# Patient Record
Sex: Male | Born: 1950
Health system: Southern US, Community
[De-identification: ages and names within clinical notes are randomized; demographics above are authoritative.]

## PROBLEM LIST (undated history)

## (undated) DIAGNOSIS — E1165 Type 2 diabetes mellitus with hyperglycemia: Secondary | ICD-10-CM

## (undated) DIAGNOSIS — E785 Hyperlipidemia, unspecified: Secondary | ICD-10-CM

## (undated) DIAGNOSIS — R7401 Elevation of levels of liver transaminase levels: Secondary | ICD-10-CM

## (undated) DIAGNOSIS — I480 Paroxysmal atrial fibrillation: Secondary | ICD-10-CM

## (undated) DIAGNOSIS — R011 Cardiac murmur, unspecified: Secondary | ICD-10-CM

## (undated) DIAGNOSIS — E039 Hypothyroidism, unspecified: Secondary | ICD-10-CM

## (undated) DIAGNOSIS — I25118 Atherosclerotic heart disease of native coronary artery with other forms of angina pectoris: Secondary | ICD-10-CM

## (undated) DIAGNOSIS — K219 Gastro-esophageal reflux disease without esophagitis: Secondary | ICD-10-CM

## (undated) DIAGNOSIS — R945 Abnormal results of liver function studies: Secondary | ICD-10-CM

## (undated) DIAGNOSIS — G8918 Other acute postprocedural pain: Secondary | ICD-10-CM

## (undated) DIAGNOSIS — R600 Localized edema: Secondary | ICD-10-CM

## (undated) DIAGNOSIS — R7309 Other abnormal glucose: Secondary | ICD-10-CM

## (undated) DIAGNOSIS — I2510 Atherosclerotic heart disease of native coronary artery without angina pectoris: Secondary | ICD-10-CM

## (undated) DIAGNOSIS — E861 Hypovolemia: Secondary | ICD-10-CM

## (undated) DIAGNOSIS — N179 Acute kidney failure, unspecified: Secondary | ICD-10-CM

## (undated) DIAGNOSIS — I499 Cardiac arrhythmia, unspecified: Secondary | ICD-10-CM

## (undated) DIAGNOSIS — R609 Edema, unspecified: Secondary | ICD-10-CM

## (undated) DIAGNOSIS — Z87898 Personal history of other specified conditions: Secondary | ICD-10-CM

## (undated) DIAGNOSIS — I483 Typical atrial flutter: Secondary | ICD-10-CM

## (undated) DIAGNOSIS — I9589 Other hypotension: Secondary | ICD-10-CM

## (undated) DIAGNOSIS — I4891 Unspecified atrial fibrillation: Secondary | ICD-10-CM

## (undated) DIAGNOSIS — H3530 Unspecified macular degeneration: Secondary | ICD-10-CM

## (undated) DIAGNOSIS — Z794 Long term (current) use of insulin: Secondary | ICD-10-CM

## (undated) DIAGNOSIS — I952 Hypotension due to drugs: Secondary | ICD-10-CM

## (undated) DIAGNOSIS — D649 Anemia, unspecified: Secondary | ICD-10-CM

## (undated) DIAGNOSIS — C801 Malignant (primary) neoplasm, unspecified: Secondary | ICD-10-CM

## (undated) DIAGNOSIS — E8809 Other disorders of plasma-protein metabolism, not elsewhere classified: Secondary | ICD-10-CM

## (undated) DIAGNOSIS — K5903 Drug induced constipation: Secondary | ICD-10-CM

## (undated) DIAGNOSIS — E088 Diabetes mellitus due to underlying condition with unspecified complications: Secondary | ICD-10-CM

## (undated) DIAGNOSIS — R601 Generalized edema: Secondary | ICD-10-CM

## (undated) DIAGNOSIS — R0600 Dyspnea, unspecified: Secondary | ICD-10-CM

## (undated) DIAGNOSIS — J392 Other diseases of pharynx: Secondary | ICD-10-CM

## (undated) DIAGNOSIS — E46 Unspecified protein-calorie malnutrition: Secondary | ICD-10-CM

## (undated) DIAGNOSIS — I10 Essential (primary) hypertension: Secondary | ICD-10-CM

## (undated) DIAGNOSIS — I4819 Other persistent atrial fibrillation: Secondary | ICD-10-CM

## (undated) DIAGNOSIS — E876 Hypokalemia: Secondary | ICD-10-CM

## (undated) HISTORY — DX: Abnormal results of liver function studies: R94.5

## (undated) HISTORY — PX: KNEE SURGERY: SHX244

## (undated) HISTORY — DX: Edema, unspecified: R60.9

## (undated) HISTORY — PX: CARDIAC CATHETERIZATION: SHX172

## (undated) HISTORY — DX: Hyperlipidemia, unspecified: E78.5

## (undated) HISTORY — DX: Localized edema: R60.0

## (undated) HISTORY — DX: Other acute postprocedural pain: G89.18

## (undated) HISTORY — DX: Diabetes mellitus due to underlying condition with unspecified complications: E08.8

## (undated) HISTORY — DX: Paroxysmal atrial fibrillation: I48.0

## (undated) HISTORY — DX: Atherosclerotic heart disease of native coronary artery without angina pectoris: I25.10

## (undated) HISTORY — DX: Other disorders of plasma-protein metabolism, not elsewhere classified: E88.09

## (undated) HISTORY — DX: Unspecified macular degeneration: H35.30

## (undated) HISTORY — DX: Other diseases of pharynx: J39.2

## (undated) HISTORY — DX: Drug induced constipation: K59.03

## (undated) HISTORY — DX: Hypokalemia: E87.6

## (undated) HISTORY — DX: Unspecified protein-calorie malnutrition: E46

## (undated) HISTORY — DX: Generalized edema: R60.1

## (undated) HISTORY — DX: Hypovolemia: E86.1

## (undated) HISTORY — PX: HAND SURGERY: SHX662

## (undated) HISTORY — PX: CARDIAC SURGERY: SHX584

## (undated) HISTORY — DX: Other hypotension: I95.89

## (undated) HISTORY — DX: Elevation of levels of liver transaminase levels: R74.01

## (undated) HISTORY — DX: Personal history of other specified conditions: Z87.898

## (undated) HISTORY — PX: CORONARY ARTERY BYPASS GRAFT: SHX141

## (undated) HISTORY — DX: Other abnormal glucose: R73.09

## (undated) HISTORY — DX: Type 2 diabetes mellitus with hyperglycemia: E11.65

## (undated) HISTORY — DX: Other persistent atrial fibrillation: I48.19

## (undated) HISTORY — DX: Essential (primary) hypertension: I10

## (undated) HISTORY — PX: AORTIC VALVE REPLACEMENT: SHX41

## (undated) HISTORY — DX: Unspecified atrial fibrillation: I48.91

## (undated) HISTORY — DX: Atherosclerotic heart disease of native coronary artery with other forms of angina pectoris: I25.118

## (undated) HISTORY — DX: Long term (current) use of insulin: Z79.4

## (undated) HISTORY — DX: Acute kidney failure, unspecified: N17.9

## (undated) HISTORY — DX: Gastro-esophageal reflux disease without esophagitis: K21.9

## (undated) HISTORY — DX: Hypotension due to drugs: I95.2

---
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
## 2015-11-22 HISTORY — PX: CORONARY ARTERY BYPASS GRAFT: SHX141

## 2016-04-09 DIAGNOSIS — E1129 Type 2 diabetes mellitus with other diabetic kidney complication: Secondary | ICD-10-CM | POA: Diagnosis not present

## 2016-04-09 DIAGNOSIS — E291 Testicular hypofunction: Secondary | ICD-10-CM | POA: Diagnosis not present

## 2016-04-09 DIAGNOSIS — I2510 Atherosclerotic heart disease of native coronary artery without angina pectoris: Secondary | ICD-10-CM | POA: Diagnosis not present

## 2016-04-09 DIAGNOSIS — I10 Essential (primary) hypertension: Secondary | ICD-10-CM | POA: Diagnosis not present

## 2016-04-09 DIAGNOSIS — E785 Hyperlipidemia, unspecified: Secondary | ICD-10-CM | POA: Diagnosis not present

## 2016-05-30 DIAGNOSIS — E785 Hyperlipidemia, unspecified: Secondary | ICD-10-CM

## 2016-05-30 DIAGNOSIS — I10 Essential (primary) hypertension: Secondary | ICD-10-CM

## 2016-05-30 DIAGNOSIS — E088 Diabetes mellitus due to underlying condition with unspecified complications: Secondary | ICD-10-CM | POA: Insufficient documentation

## 2016-05-30 DIAGNOSIS — I480 Paroxysmal atrial fibrillation: Secondary | ICD-10-CM | POA: Insufficient documentation

## 2016-05-30 DIAGNOSIS — I2510 Atherosclerotic heart disease of native coronary artery without angina pectoris: Secondary | ICD-10-CM

## 2016-05-30 HISTORY — DX: Hyperlipidemia, unspecified: E78.5

## 2016-05-30 HISTORY — DX: Diabetes mellitus due to underlying condition with unspecified complications: E08.8

## 2016-05-30 HISTORY — DX: Atherosclerotic heart disease of native coronary artery without angina pectoris: I25.10

## 2016-05-30 HISTORY — DX: Paroxysmal atrial fibrillation: I48.0

## 2016-05-30 HISTORY — DX: Essential (primary) hypertension: I10

## 2016-06-16 DIAGNOSIS — Z5181 Encounter for therapeutic drug level monitoring: Secondary | ICD-10-CM | POA: Diagnosis not present

## 2016-06-16 DIAGNOSIS — E785 Hyperlipidemia, unspecified: Secondary | ICD-10-CM | POA: Diagnosis not present

## 2016-06-16 DIAGNOSIS — E088 Diabetes mellitus due to underlying condition with unspecified complications: Secondary | ICD-10-CM | POA: Diagnosis not present

## 2016-06-16 DIAGNOSIS — I2510 Atherosclerotic heart disease of native coronary artery without angina pectoris: Secondary | ICD-10-CM | POA: Diagnosis not present

## 2016-06-16 DIAGNOSIS — I10 Essential (primary) hypertension: Secondary | ICD-10-CM | POA: Diagnosis not present

## 2016-06-16 DIAGNOSIS — I480 Paroxysmal atrial fibrillation: Secondary | ICD-10-CM | POA: Diagnosis not present

## 2016-06-28 DIAGNOSIS — I2510 Atherosclerotic heart disease of native coronary artery without angina pectoris: Secondary | ICD-10-CM | POA: Diagnosis not present

## 2016-06-28 DIAGNOSIS — E088 Diabetes mellitus due to underlying condition with unspecified complications: Secondary | ICD-10-CM | POA: Diagnosis not present

## 2016-06-28 DIAGNOSIS — I10 Essential (primary) hypertension: Secondary | ICD-10-CM | POA: Diagnosis not present

## 2016-06-28 DIAGNOSIS — I480 Paroxysmal atrial fibrillation: Secondary | ICD-10-CM | POA: Diagnosis not present

## 2016-07-01 DIAGNOSIS — I10 Essential (primary) hypertension: Secondary | ICD-10-CM | POA: Diagnosis not present

## 2016-07-01 DIAGNOSIS — I350 Nonrheumatic aortic (valve) stenosis: Secondary | ICD-10-CM | POA: Diagnosis not present

## 2016-07-01 DIAGNOSIS — E785 Hyperlipidemia, unspecified: Secondary | ICD-10-CM | POA: Diagnosis not present

## 2016-07-01 DIAGNOSIS — E088 Diabetes mellitus due to underlying condition with unspecified complications: Secondary | ICD-10-CM | POA: Diagnosis not present

## 2016-07-01 DIAGNOSIS — I480 Paroxysmal atrial fibrillation: Secondary | ICD-10-CM | POA: Diagnosis not present

## 2016-07-01 DIAGNOSIS — I2510 Atherosclerotic heart disease of native coronary artery without angina pectoris: Secondary | ICD-10-CM | POA: Diagnosis not present

## 2016-07-12 DIAGNOSIS — I2510 Atherosclerotic heart disease of native coronary artery without angina pectoris: Secondary | ICD-10-CM | POA: Diagnosis not present

## 2016-07-18 DIAGNOSIS — I480 Paroxysmal atrial fibrillation: Secondary | ICD-10-CM | POA: Diagnosis not present

## 2016-07-18 DIAGNOSIS — E119 Type 2 diabetes mellitus without complications: Secondary | ICD-10-CM | POA: Diagnosis not present

## 2016-07-18 DIAGNOSIS — I080 Rheumatic disorders of both mitral and aortic valves: Secondary | ICD-10-CM | POA: Diagnosis not present

## 2016-07-18 DIAGNOSIS — I10 Essential (primary) hypertension: Secondary | ICD-10-CM | POA: Diagnosis not present

## 2016-07-18 DIAGNOSIS — I700 Atherosclerosis of aorta: Secondary | ICD-10-CM | POA: Diagnosis not present

## 2016-07-18 DIAGNOSIS — Z7984 Long term (current) use of oral hypoglycemic drugs: Secondary | ICD-10-CM | POA: Diagnosis not present

## 2016-07-18 DIAGNOSIS — I350 Nonrheumatic aortic (valve) stenosis: Secondary | ICD-10-CM | POA: Diagnosis not present

## 2016-07-18 DIAGNOSIS — R9439 Abnormal result of other cardiovascular function study: Secondary | ICD-10-CM | POA: Diagnosis not present

## 2016-07-18 DIAGNOSIS — I2510 Atherosclerotic heart disease of native coronary artery without angina pectoris: Secondary | ICD-10-CM | POA: Diagnosis not present

## 2016-07-18 DIAGNOSIS — I77810 Thoracic aortic ectasia: Secondary | ICD-10-CM | POA: Diagnosis not present

## 2016-07-18 DIAGNOSIS — Z955 Presence of coronary angioplasty implant and graft: Secondary | ICD-10-CM | POA: Diagnosis not present

## 2016-07-18 DIAGNOSIS — Z79899 Other long term (current) drug therapy: Secondary | ICD-10-CM | POA: Diagnosis not present

## 2016-08-09 DIAGNOSIS — I350 Nonrheumatic aortic (valve) stenosis: Secondary | ICD-10-CM | POA: Diagnosis not present

## 2016-08-09 DIAGNOSIS — R001 Bradycardia, unspecified: Secondary | ICD-10-CM | POA: Diagnosis not present

## 2016-08-09 DIAGNOSIS — Z01818 Encounter for other preprocedural examination: Secondary | ICD-10-CM | POA: Diagnosis not present

## 2016-08-09 DIAGNOSIS — I252 Old myocardial infarction: Secondary | ICD-10-CM | POA: Diagnosis not present

## 2016-08-09 DIAGNOSIS — Z01811 Encounter for preprocedural respiratory examination: Secondary | ICD-10-CM | POA: Diagnosis not present

## 2016-08-16 DIAGNOSIS — R918 Other nonspecific abnormal finding of lung field: Secondary | ICD-10-CM | POA: Diagnosis not present

## 2016-08-16 DIAGNOSIS — Z6831 Body mass index (BMI) 31.0-31.9, adult: Secondary | ICD-10-CM | POA: Diagnosis not present

## 2016-08-16 DIAGNOSIS — I10 Essential (primary) hypertension: Secondary | ICD-10-CM | POA: Diagnosis not present

## 2016-08-16 DIAGNOSIS — E876 Hypokalemia: Secondary | ICD-10-CM | POA: Diagnosis not present

## 2016-08-16 DIAGNOSIS — I480 Paroxysmal atrial fibrillation: Secondary | ICD-10-CM | POA: Diagnosis not present

## 2016-08-16 DIAGNOSIS — I998 Other disorder of circulatory system: Secondary | ICD-10-CM | POA: Diagnosis not present

## 2016-08-16 DIAGNOSIS — D689 Coagulation defect, unspecified: Secondary | ICD-10-CM | POA: Diagnosis not present

## 2016-08-16 DIAGNOSIS — Z952 Presence of prosthetic heart valve: Secondary | ICD-10-CM | POA: Diagnosis not present

## 2016-08-16 DIAGNOSIS — I119 Hypertensive heart disease without heart failure: Secondary | ICD-10-CM | POA: Diagnosis not present

## 2016-08-16 DIAGNOSIS — I4581 Long QT syndrome: Secondary | ICD-10-CM | POA: Diagnosis not present

## 2016-08-16 DIAGNOSIS — I2109 ST elevation (STEMI) myocardial infarction involving other coronary artery of anterior wall: Secondary | ICD-10-CM | POA: Diagnosis not present

## 2016-08-16 DIAGNOSIS — I2510 Atherosclerotic heart disease of native coronary artery without angina pectoris: Secondary | ICD-10-CM | POA: Diagnosis not present

## 2016-08-16 DIAGNOSIS — E785 Hyperlipidemia, unspecified: Secondary | ICD-10-CM | POA: Diagnosis not present

## 2016-08-16 DIAGNOSIS — I358 Other nonrheumatic aortic valve disorders: Secondary | ICD-10-CM | POA: Diagnosis not present

## 2016-08-16 DIAGNOSIS — I499 Cardiac arrhythmia, unspecified: Secondary | ICD-10-CM | POA: Diagnosis not present

## 2016-08-16 DIAGNOSIS — I350 Nonrheumatic aortic (valve) stenosis: Secondary | ICD-10-CM | POA: Diagnosis not present

## 2016-08-16 DIAGNOSIS — N289 Disorder of kidney and ureter, unspecified: Secondary | ICD-10-CM | POA: Diagnosis not present

## 2016-08-16 DIAGNOSIS — J9811 Atelectasis: Secondary | ICD-10-CM | POA: Diagnosis not present

## 2016-08-16 DIAGNOSIS — Z6833 Body mass index (BMI) 33.0-33.9, adult: Secondary | ICD-10-CM | POA: Diagnosis not present

## 2016-08-16 DIAGNOSIS — Z951 Presence of aortocoronary bypass graft: Secondary | ICD-10-CM | POA: Diagnosis not present

## 2016-08-16 DIAGNOSIS — Z79899 Other long term (current) drug therapy: Secondary | ICD-10-CM | POA: Diagnosis not present

## 2016-08-16 DIAGNOSIS — E784 Other hyperlipidemia: Secondary | ICD-10-CM | POA: Diagnosis not present

## 2016-08-16 DIAGNOSIS — K219 Gastro-esophageal reflux disease without esophagitis: Secondary | ICD-10-CM | POA: Diagnosis not present

## 2016-08-16 DIAGNOSIS — D62 Acute posthemorrhagic anemia: Secondary | ICD-10-CM | POA: Diagnosis not present

## 2016-08-16 DIAGNOSIS — I471 Supraventricular tachycardia: Secondary | ICD-10-CM | POA: Diagnosis not present

## 2016-08-16 DIAGNOSIS — Z6832 Body mass index (BMI) 32.0-32.9, adult: Secondary | ICD-10-CM | POA: Diagnosis not present

## 2016-08-16 DIAGNOSIS — E119 Type 2 diabetes mellitus without complications: Secondary | ICD-10-CM | POA: Diagnosis not present

## 2016-08-17 DIAGNOSIS — I471 Supraventricular tachycardia: Secondary | ICD-10-CM | POA: Diagnosis not present

## 2016-08-17 DIAGNOSIS — Z952 Presence of prosthetic heart valve: Secondary | ICD-10-CM | POA: Diagnosis not present

## 2016-08-17 DIAGNOSIS — Z951 Presence of aortocoronary bypass graft: Secondary | ICD-10-CM | POA: Diagnosis not present

## 2016-08-17 DIAGNOSIS — R918 Other nonspecific abnormal finding of lung field: Secondary | ICD-10-CM | POA: Diagnosis not present

## 2016-08-17 DIAGNOSIS — I2109 ST elevation (STEMI) myocardial infarction involving other coronary artery of anterior wall: Secondary | ICD-10-CM | POA: Diagnosis not present

## 2016-08-17 DIAGNOSIS — I4581 Long QT syndrome: Secondary | ICD-10-CM | POA: Diagnosis not present

## 2016-08-17 DIAGNOSIS — I998 Other disorder of circulatory system: Secondary | ICD-10-CM | POA: Diagnosis not present

## 2016-08-17 DIAGNOSIS — Z6833 Body mass index (BMI) 33.0-33.9, adult: Secondary | ICD-10-CM | POA: Diagnosis not present

## 2016-08-18 DIAGNOSIS — J9811 Atelectasis: Secondary | ICD-10-CM | POA: Diagnosis not present

## 2016-08-18 DIAGNOSIS — Z952 Presence of prosthetic heart valve: Secondary | ICD-10-CM | POA: Diagnosis not present

## 2016-08-18 DIAGNOSIS — Z6833 Body mass index (BMI) 33.0-33.9, adult: Secondary | ICD-10-CM | POA: Diagnosis not present

## 2016-08-18 DIAGNOSIS — I358 Other nonrheumatic aortic valve disorders: Secondary | ICD-10-CM | POA: Diagnosis not present

## 2016-08-18 DIAGNOSIS — Z951 Presence of aortocoronary bypass graft: Secondary | ICD-10-CM | POA: Diagnosis not present

## 2016-08-19 DIAGNOSIS — I2510 Atherosclerotic heart disease of native coronary artery without angina pectoris: Secondary | ICD-10-CM | POA: Diagnosis not present

## 2016-08-19 DIAGNOSIS — J9811 Atelectasis: Secondary | ICD-10-CM | POA: Diagnosis not present

## 2016-08-19 DIAGNOSIS — I350 Nonrheumatic aortic (valve) stenosis: Secondary | ICD-10-CM | POA: Diagnosis not present

## 2016-08-19 DIAGNOSIS — I480 Paroxysmal atrial fibrillation: Secondary | ICD-10-CM | POA: Diagnosis not present

## 2016-08-19 DIAGNOSIS — Z951 Presence of aortocoronary bypass graft: Secondary | ICD-10-CM | POA: Diagnosis not present

## 2016-08-20 DIAGNOSIS — I358 Other nonrheumatic aortic valve disorders: Secondary | ICD-10-CM | POA: Diagnosis not present

## 2016-08-20 DIAGNOSIS — I480 Paroxysmal atrial fibrillation: Secondary | ICD-10-CM | POA: Diagnosis not present

## 2016-08-20 DIAGNOSIS — E784 Other hyperlipidemia: Secondary | ICD-10-CM | POA: Diagnosis not present

## 2016-08-20 DIAGNOSIS — I2510 Atherosclerotic heart disease of native coronary artery without angina pectoris: Secondary | ICD-10-CM | POA: Diagnosis not present

## 2016-08-21 DIAGNOSIS — Z951 Presence of aortocoronary bypass graft: Secondary | ICD-10-CM | POA: Diagnosis not present

## 2016-08-21 DIAGNOSIS — Z952 Presence of prosthetic heart valve: Secondary | ICD-10-CM | POA: Diagnosis not present

## 2016-08-21 DIAGNOSIS — Z6832 Body mass index (BMI) 32.0-32.9, adult: Secondary | ICD-10-CM | POA: Diagnosis not present

## 2016-08-21 DIAGNOSIS — I480 Paroxysmal atrial fibrillation: Secondary | ICD-10-CM | POA: Diagnosis not present

## 2016-08-22 DIAGNOSIS — I480 Paroxysmal atrial fibrillation: Secondary | ICD-10-CM | POA: Diagnosis not present

## 2016-08-22 DIAGNOSIS — I10 Essential (primary) hypertension: Secondary | ICD-10-CM | POA: Diagnosis not present

## 2016-08-22 DIAGNOSIS — Z6831 Body mass index (BMI) 31.0-31.9, adult: Secondary | ICD-10-CM | POA: Diagnosis not present

## 2016-08-22 DIAGNOSIS — Z951 Presence of aortocoronary bypass graft: Secondary | ICD-10-CM | POA: Diagnosis not present

## 2016-08-23 DIAGNOSIS — E784 Other hyperlipidemia: Secondary | ICD-10-CM | POA: Diagnosis not present

## 2016-08-23 DIAGNOSIS — I119 Hypertensive heart disease without heart failure: Secondary | ICD-10-CM | POA: Diagnosis not present

## 2016-08-23 DIAGNOSIS — I350 Nonrheumatic aortic (valve) stenosis: Secondary | ICD-10-CM | POA: Diagnosis not present

## 2016-08-23 DIAGNOSIS — I480 Paroxysmal atrial fibrillation: Secondary | ICD-10-CM | POA: Diagnosis not present

## 2016-09-01 DIAGNOSIS — I480 Paroxysmal atrial fibrillation: Secondary | ICD-10-CM | POA: Diagnosis not present

## 2016-09-01 DIAGNOSIS — I10 Essential (primary) hypertension: Secondary | ICD-10-CM | POA: Diagnosis not present

## 2016-09-01 DIAGNOSIS — I2510 Atherosclerotic heart disease of native coronary artery without angina pectoris: Secondary | ICD-10-CM | POA: Diagnosis not present

## 2016-09-01 DIAGNOSIS — E088 Diabetes mellitus due to underlying condition with unspecified complications: Secondary | ICD-10-CM | POA: Diagnosis not present

## 2016-09-08 DIAGNOSIS — I2510 Atherosclerotic heart disease of native coronary artery without angina pectoris: Secondary | ICD-10-CM | POA: Diagnosis not present

## 2016-09-08 DIAGNOSIS — I480 Paroxysmal atrial fibrillation: Secondary | ICD-10-CM | POA: Diagnosis not present

## 2016-09-08 DIAGNOSIS — I4891 Unspecified atrial fibrillation: Secondary | ICD-10-CM | POA: Diagnosis not present

## 2016-09-30 DIAGNOSIS — J90 Pleural effusion, not elsewhere classified: Secondary | ICD-10-CM | POA: Diagnosis not present

## 2016-09-30 DIAGNOSIS — I2510 Atherosclerotic heart disease of native coronary artery without angina pectoris: Secondary | ICD-10-CM | POA: Diagnosis not present

## 2016-09-30 DIAGNOSIS — Z955 Presence of coronary angioplasty implant and graft: Secondary | ICD-10-CM | POA: Diagnosis not present

## 2016-09-30 DIAGNOSIS — R079 Chest pain, unspecified: Secondary | ICD-10-CM | POA: Diagnosis not present

## 2016-09-30 DIAGNOSIS — M7989 Other specified soft tissue disorders: Secondary | ICD-10-CM | POA: Diagnosis not present

## 2016-09-30 DIAGNOSIS — R0602 Shortness of breath: Secondary | ICD-10-CM | POA: Diagnosis not present

## 2016-09-30 DIAGNOSIS — I480 Paroxysmal atrial fibrillation: Secondary | ICD-10-CM | POA: Diagnosis not present

## 2016-09-30 DIAGNOSIS — Z951 Presence of aortocoronary bypass graft: Secondary | ICD-10-CM | POA: Diagnosis not present

## 2016-09-30 DIAGNOSIS — R0789 Other chest pain: Secondary | ICD-10-CM | POA: Diagnosis not present

## 2016-09-30 DIAGNOSIS — E119 Type 2 diabetes mellitus without complications: Secondary | ICD-10-CM | POA: Diagnosis not present

## 2016-09-30 DIAGNOSIS — I10 Essential (primary) hypertension: Secondary | ICD-10-CM | POA: Diagnosis not present

## 2016-10-11 DIAGNOSIS — E088 Diabetes mellitus due to underlying condition with unspecified complications: Secondary | ICD-10-CM | POA: Diagnosis not present

## 2016-10-11 DIAGNOSIS — Z951 Presence of aortocoronary bypass graft: Secondary | ICD-10-CM | POA: Diagnosis not present

## 2016-10-11 DIAGNOSIS — Z7984 Long term (current) use of oral hypoglycemic drugs: Secondary | ICD-10-CM | POA: Diagnosis not present

## 2016-10-11 DIAGNOSIS — R011 Cardiac murmur, unspecified: Secondary | ICD-10-CM | POA: Diagnosis not present

## 2016-10-11 DIAGNOSIS — E119 Type 2 diabetes mellitus without complications: Secondary | ICD-10-CM | POA: Diagnosis not present

## 2016-10-11 DIAGNOSIS — Z79899 Other long term (current) drug therapy: Secondary | ICD-10-CM | POA: Diagnosis not present

## 2016-10-11 DIAGNOSIS — I4891 Unspecified atrial fibrillation: Secondary | ICD-10-CM | POA: Diagnosis not present

## 2016-10-11 DIAGNOSIS — E785 Hyperlipidemia, unspecified: Secondary | ICD-10-CM | POA: Diagnosis not present

## 2016-10-11 DIAGNOSIS — R0602 Shortness of breath: Secondary | ICD-10-CM | POA: Diagnosis not present

## 2016-10-11 DIAGNOSIS — I119 Hypertensive heart disease without heart failure: Secondary | ICD-10-CM | POA: Diagnosis not present

## 2016-10-11 DIAGNOSIS — Z955 Presence of coronary angioplasty implant and graft: Secondary | ICD-10-CM | POA: Diagnosis not present

## 2016-10-11 DIAGNOSIS — Z7901 Long term (current) use of anticoagulants: Secondary | ICD-10-CM | POA: Diagnosis not present

## 2016-10-11 DIAGNOSIS — I10 Essential (primary) hypertension: Secondary | ICD-10-CM | POA: Diagnosis not present

## 2016-10-11 DIAGNOSIS — J90 Pleural effusion, not elsewhere classified: Secondary | ICD-10-CM | POA: Diagnosis not present

## 2016-10-11 DIAGNOSIS — I2510 Atherosclerotic heart disease of native coronary artery without angina pectoris: Secondary | ICD-10-CM | POA: Diagnosis not present

## 2016-10-11 DIAGNOSIS — R0789 Other chest pain: Secondary | ICD-10-CM | POA: Diagnosis not present

## 2016-10-11 DIAGNOSIS — D649 Anemia, unspecified: Secondary | ICD-10-CM | POA: Diagnosis not present

## 2016-10-11 DIAGNOSIS — E784 Other hyperlipidemia: Secondary | ICD-10-CM | POA: Diagnosis not present

## 2016-10-11 DIAGNOSIS — Z7982 Long term (current) use of aspirin: Secondary | ICD-10-CM | POA: Diagnosis not present

## 2016-10-18 DIAGNOSIS — J208 Acute bronchitis due to other specified organisms: Secondary | ICD-10-CM | POA: Diagnosis not present

## 2016-10-18 DIAGNOSIS — Z6827 Body mass index (BMI) 27.0-27.9, adult: Secondary | ICD-10-CM | POA: Diagnosis not present

## 2016-10-26 DIAGNOSIS — E088 Diabetes mellitus due to underlying condition with unspecified complications: Secondary | ICD-10-CM | POA: Diagnosis not present

## 2016-10-26 DIAGNOSIS — I10 Essential (primary) hypertension: Secondary | ICD-10-CM | POA: Diagnosis not present

## 2016-10-26 DIAGNOSIS — I2510 Atherosclerotic heart disease of native coronary artery without angina pectoris: Secondary | ICD-10-CM | POA: Diagnosis not present

## 2016-10-26 DIAGNOSIS — E785 Hyperlipidemia, unspecified: Secondary | ICD-10-CM | POA: Diagnosis not present

## 2016-10-26 DIAGNOSIS — I480 Paroxysmal atrial fibrillation: Secondary | ICD-10-CM | POA: Diagnosis not present

## 2016-10-31 DIAGNOSIS — R197 Diarrhea, unspecified: Secondary | ICD-10-CM | POA: Diagnosis not present

## 2016-10-31 DIAGNOSIS — J90 Pleural effusion, not elsewhere classified: Secondary | ICD-10-CM | POA: Diagnosis not present

## 2016-10-31 DIAGNOSIS — Z951 Presence of aortocoronary bypass graft: Secondary | ICD-10-CM | POA: Diagnosis not present

## 2016-10-31 DIAGNOSIS — R945 Abnormal results of liver function studies: Secondary | ICD-10-CM

## 2016-10-31 DIAGNOSIS — R0602 Shortness of breath: Secondary | ICD-10-CM | POA: Diagnosis not present

## 2016-10-31 DIAGNOSIS — I2510 Atherosclerotic heart disease of native coronary artery without angina pectoris: Secondary | ICD-10-CM | POA: Diagnosis not present

## 2016-10-31 DIAGNOSIS — Z7984 Long term (current) use of oral hypoglycemic drugs: Secondary | ICD-10-CM | POA: Diagnosis not present

## 2016-10-31 DIAGNOSIS — Z79899 Other long term (current) drug therapy: Secondary | ICD-10-CM | POA: Diagnosis not present

## 2016-10-31 DIAGNOSIS — E088 Diabetes mellitus due to underlying condition with unspecified complications: Secondary | ICD-10-CM | POA: Diagnosis not present

## 2016-10-31 DIAGNOSIS — Z955 Presence of coronary angioplasty implant and graft: Secondary | ICD-10-CM | POA: Diagnosis not present

## 2016-10-31 DIAGNOSIS — R7989 Other specified abnormal findings of blood chemistry: Secondary | ICD-10-CM | POA: Insufficient documentation

## 2016-10-31 DIAGNOSIS — Z7982 Long term (current) use of aspirin: Secondary | ICD-10-CM | POA: Diagnosis not present

## 2016-10-31 DIAGNOSIS — J910 Malignant pleural effusion: Secondary | ICD-10-CM | POA: Diagnosis not present

## 2016-10-31 DIAGNOSIS — E785 Hyperlipidemia, unspecified: Secondary | ICD-10-CM | POA: Diagnosis not present

## 2016-10-31 DIAGNOSIS — R748 Abnormal levels of other serum enzymes: Secondary | ICD-10-CM | POA: Diagnosis not present

## 2016-10-31 DIAGNOSIS — Z7901 Long term (current) use of anticoagulants: Secondary | ICD-10-CM | POA: Diagnosis not present

## 2016-10-31 DIAGNOSIS — I480 Paroxysmal atrial fibrillation: Secondary | ICD-10-CM | POA: Diagnosis not present

## 2016-10-31 DIAGNOSIS — Z952 Presence of prosthetic heart valve: Secondary | ICD-10-CM | POA: Diagnosis not present

## 2016-10-31 DIAGNOSIS — I10 Essential (primary) hypertension: Secondary | ICD-10-CM | POA: Diagnosis not present

## 2016-10-31 HISTORY — DX: Other specified abnormal findings of blood chemistry: R79.89

## 2016-10-31 HISTORY — DX: Abnormal results of liver function studies: R94.5

## 2016-11-03 DIAGNOSIS — R7989 Other specified abnormal findings of blood chemistry: Secondary | ICD-10-CM | POA: Diagnosis not present

## 2016-11-03 DIAGNOSIS — Z1389 Encounter for screening for other disorder: Secondary | ICD-10-CM | POA: Diagnosis not present

## 2016-11-03 DIAGNOSIS — Z6825 Body mass index (BMI) 25.0-25.9, adult: Secondary | ICD-10-CM | POA: Diagnosis not present

## 2016-11-03 DIAGNOSIS — J320 Chronic maxillary sinusitis: Secondary | ICD-10-CM | POA: Diagnosis not present

## 2016-11-10 DIAGNOSIS — Z952 Presence of prosthetic heart valve: Secondary | ICD-10-CM | POA: Diagnosis not present

## 2016-11-11 DIAGNOSIS — Z952 Presence of prosthetic heart valve: Secondary | ICD-10-CM | POA: Diagnosis not present

## 2016-11-15 DIAGNOSIS — Z952 Presence of prosthetic heart valve: Secondary | ICD-10-CM | POA: Diagnosis not present

## 2016-11-16 DIAGNOSIS — Z952 Presence of prosthetic heart valve: Secondary | ICD-10-CM | POA: Diagnosis not present

## 2016-11-18 DIAGNOSIS — Z952 Presence of prosthetic heart valve: Secondary | ICD-10-CM | POA: Diagnosis not present

## 2016-11-22 DIAGNOSIS — Z955 Presence of coronary angioplasty implant and graft: Secondary | ICD-10-CM | POA: Diagnosis not present

## 2016-11-23 DIAGNOSIS — Z955 Presence of coronary angioplasty implant and graft: Secondary | ICD-10-CM | POA: Diagnosis not present

## 2016-11-25 DIAGNOSIS — I2510 Atherosclerotic heart disease of native coronary artery without angina pectoris: Secondary | ICD-10-CM | POA: Diagnosis not present

## 2016-11-25 DIAGNOSIS — J90 Pleural effusion, not elsewhere classified: Secondary | ICD-10-CM | POA: Diagnosis not present

## 2016-11-25 DIAGNOSIS — I480 Paroxysmal atrial fibrillation: Secondary | ICD-10-CM | POA: Diagnosis not present

## 2016-11-25 DIAGNOSIS — E785 Hyperlipidemia, unspecified: Secondary | ICD-10-CM | POA: Diagnosis not present

## 2016-11-25 DIAGNOSIS — R7989 Other specified abnormal findings of blood chemistry: Secondary | ICD-10-CM | POA: Diagnosis not present

## 2016-11-25 DIAGNOSIS — Z955 Presence of coronary angioplasty implant and graft: Secondary | ICD-10-CM | POA: Diagnosis not present

## 2016-11-25 DIAGNOSIS — E088 Diabetes mellitus due to underlying condition with unspecified complications: Secondary | ICD-10-CM | POA: Diagnosis not present

## 2016-11-25 DIAGNOSIS — I10 Essential (primary) hypertension: Secondary | ICD-10-CM | POA: Diagnosis not present

## 2016-11-28 DIAGNOSIS — Z955 Presence of coronary angioplasty implant and graft: Secondary | ICD-10-CM | POA: Diagnosis not present

## 2016-11-30 DIAGNOSIS — R05 Cough: Secondary | ICD-10-CM | POA: Diagnosis not present

## 2016-11-30 DIAGNOSIS — R0602 Shortness of breath: Secondary | ICD-10-CM | POA: Diagnosis not present

## 2016-11-30 DIAGNOSIS — I10 Essential (primary) hypertension: Secondary | ICD-10-CM | POA: Diagnosis not present

## 2016-12-02 DIAGNOSIS — Z955 Presence of coronary angioplasty implant and graft: Secondary | ICD-10-CM | POA: Diagnosis not present

## 2016-12-05 DIAGNOSIS — J4540 Moderate persistent asthma, uncomplicated: Secondary | ICD-10-CM | POA: Diagnosis not present

## 2016-12-05 DIAGNOSIS — R5383 Other fatigue: Secondary | ICD-10-CM | POA: Diagnosis not present

## 2016-12-05 DIAGNOSIS — Z955 Presence of coronary angioplasty implant and graft: Secondary | ICD-10-CM | POA: Diagnosis not present

## 2016-12-12 DIAGNOSIS — Z955 Presence of coronary angioplasty implant and graft: Secondary | ICD-10-CM | POA: Diagnosis not present

## 2016-12-14 DIAGNOSIS — Z955 Presence of coronary angioplasty implant and graft: Secondary | ICD-10-CM | POA: Diagnosis not present

## 2016-12-16 DIAGNOSIS — Z955 Presence of coronary angioplasty implant and graft: Secondary | ICD-10-CM | POA: Diagnosis not present

## 2016-12-19 DIAGNOSIS — J101 Influenza due to other identified influenza virus with other respiratory manifestations: Secondary | ICD-10-CM | POA: Diagnosis not present

## 2016-12-26 DIAGNOSIS — Z952 Presence of prosthetic heart valve: Secondary | ICD-10-CM | POA: Diagnosis not present

## 2016-12-26 DIAGNOSIS — Z955 Presence of coronary angioplasty implant and graft: Secondary | ICD-10-CM | POA: Diagnosis not present

## 2016-12-27 DIAGNOSIS — I2510 Atherosclerotic heart disease of native coronary artery without angina pectoris: Secondary | ICD-10-CM | POA: Diagnosis not present

## 2016-12-27 DIAGNOSIS — I10 Essential (primary) hypertension: Secondary | ICD-10-CM | POA: Diagnosis not present

## 2016-12-27 DIAGNOSIS — I480 Paroxysmal atrial fibrillation: Secondary | ICD-10-CM | POA: Diagnosis not present

## 2016-12-27 DIAGNOSIS — E088 Diabetes mellitus due to underlying condition with unspecified complications: Secondary | ICD-10-CM | POA: Diagnosis not present

## 2016-12-28 DIAGNOSIS — Z952 Presence of prosthetic heart valve: Secondary | ICD-10-CM | POA: Diagnosis not present

## 2016-12-28 DIAGNOSIS — Z955 Presence of coronary angioplasty implant and graft: Secondary | ICD-10-CM | POA: Diagnosis not present

## 2016-12-30 DIAGNOSIS — Z955 Presence of coronary angioplasty implant and graft: Secondary | ICD-10-CM | POA: Diagnosis not present

## 2016-12-30 DIAGNOSIS — Z952 Presence of prosthetic heart valve: Secondary | ICD-10-CM | POA: Diagnosis not present

## 2017-01-02 DIAGNOSIS — Z955 Presence of coronary angioplasty implant and graft: Secondary | ICD-10-CM | POA: Diagnosis not present

## 2017-01-02 DIAGNOSIS — I2510 Atherosclerotic heart disease of native coronary artery without angina pectoris: Secondary | ICD-10-CM | POA: Diagnosis not present

## 2017-01-02 DIAGNOSIS — Z952 Presence of prosthetic heart valve: Secondary | ICD-10-CM | POA: Diagnosis not present

## 2017-01-03 DIAGNOSIS — I2510 Atherosclerotic heart disease of native coronary artery without angina pectoris: Secondary | ICD-10-CM | POA: Diagnosis not present

## 2017-01-03 DIAGNOSIS — R9439 Abnormal result of other cardiovascular function study: Secondary | ICD-10-CM | POA: Diagnosis not present

## 2017-01-04 DIAGNOSIS — I10 Essential (primary) hypertension: Secondary | ICD-10-CM | POA: Diagnosis not present

## 2017-01-04 DIAGNOSIS — I2510 Atherosclerotic heart disease of native coronary artery without angina pectoris: Secondary | ICD-10-CM | POA: Diagnosis not present

## 2017-01-04 DIAGNOSIS — E119 Type 2 diabetes mellitus without complications: Secondary | ICD-10-CM | POA: Diagnosis not present

## 2017-01-04 DIAGNOSIS — E785 Hyperlipidemia, unspecified: Secondary | ICD-10-CM | POA: Diagnosis not present

## 2017-01-04 DIAGNOSIS — M1990 Unspecified osteoarthritis, unspecified site: Secondary | ICD-10-CM | POA: Diagnosis not present

## 2017-01-04 DIAGNOSIS — I081 Rheumatic disorders of both mitral and tricuspid valves: Secondary | ICD-10-CM | POA: Diagnosis not present

## 2017-01-04 DIAGNOSIS — I480 Paroxysmal atrial fibrillation: Secondary | ICD-10-CM | POA: Diagnosis not present

## 2017-01-04 DIAGNOSIS — I2582 Chronic total occlusion of coronary artery: Secondary | ICD-10-CM | POA: Diagnosis not present

## 2017-01-04 DIAGNOSIS — I25810 Atherosclerosis of coronary artery bypass graft(s) without angina pectoris: Secondary | ICD-10-CM | POA: Diagnosis not present

## 2017-01-04 DIAGNOSIS — I447 Left bundle-branch block, unspecified: Secondary | ICD-10-CM | POA: Diagnosis not present

## 2017-01-04 DIAGNOSIS — R9439 Abnormal result of other cardiovascular function study: Secondary | ICD-10-CM | POA: Diagnosis not present

## 2017-01-04 DIAGNOSIS — J90 Pleural effusion, not elsewhere classified: Secondary | ICD-10-CM | POA: Diagnosis not present

## 2017-01-04 DIAGNOSIS — Z7982 Long term (current) use of aspirin: Secondary | ICD-10-CM | POA: Diagnosis not present

## 2017-01-04 DIAGNOSIS — Z951 Presence of aortocoronary bypass graft: Secondary | ICD-10-CM | POA: Diagnosis not present

## 2017-01-04 DIAGNOSIS — H3530 Unspecified macular degeneration: Secondary | ICD-10-CM | POA: Diagnosis not present

## 2017-01-04 DIAGNOSIS — Z955 Presence of coronary angioplasty implant and graft: Secondary | ICD-10-CM | POA: Diagnosis not present

## 2017-01-04 DIAGNOSIS — Z79899 Other long term (current) drug therapy: Secondary | ICD-10-CM | POA: Diagnosis not present

## 2017-01-05 DIAGNOSIS — I340 Nonrheumatic mitral (valve) insufficiency: Secondary | ICD-10-CM | POA: Diagnosis not present

## 2017-01-05 DIAGNOSIS — Z7982 Long term (current) use of aspirin: Secondary | ICD-10-CM | POA: Diagnosis not present

## 2017-01-05 DIAGNOSIS — M1990 Unspecified osteoarthritis, unspecified site: Secondary | ICD-10-CM | POA: Diagnosis not present

## 2017-01-05 DIAGNOSIS — I2510 Atherosclerotic heart disease of native coronary artery without angina pectoris: Secondary | ICD-10-CM | POA: Diagnosis not present

## 2017-01-05 DIAGNOSIS — E785 Hyperlipidemia, unspecified: Secondary | ICD-10-CM | POA: Diagnosis not present

## 2017-01-05 DIAGNOSIS — E119 Type 2 diabetes mellitus without complications: Secondary | ICD-10-CM | POA: Diagnosis not present

## 2017-01-05 DIAGNOSIS — Z955 Presence of coronary angioplasty implant and graft: Secondary | ICD-10-CM | POA: Diagnosis not present

## 2017-01-05 DIAGNOSIS — Z952 Presence of prosthetic heart valve: Secondary | ICD-10-CM | POA: Diagnosis not present

## 2017-01-05 DIAGNOSIS — J90 Pleural effusion, not elsewhere classified: Secondary | ICD-10-CM | POA: Diagnosis not present

## 2017-01-05 DIAGNOSIS — R9439 Abnormal result of other cardiovascular function study: Secondary | ICD-10-CM | POA: Diagnosis not present

## 2017-01-05 DIAGNOSIS — E784 Other hyperlipidemia: Secondary | ICD-10-CM | POA: Diagnosis not present

## 2017-01-05 DIAGNOSIS — I081 Rheumatic disorders of both mitral and tricuspid valves: Secondary | ICD-10-CM | POA: Diagnosis not present

## 2017-01-05 DIAGNOSIS — I361 Nonrheumatic tricuspid (valve) insufficiency: Secondary | ICD-10-CM | POA: Diagnosis not present

## 2017-01-05 DIAGNOSIS — I25118 Atherosclerotic heart disease of native coronary artery with other forms of angina pectoris: Secondary | ICD-10-CM | POA: Diagnosis not present

## 2017-01-05 DIAGNOSIS — I119 Hypertensive heart disease without heart failure: Secondary | ICD-10-CM | POA: Diagnosis not present

## 2017-01-05 DIAGNOSIS — I480 Paroxysmal atrial fibrillation: Secondary | ICD-10-CM | POA: Diagnosis not present

## 2017-01-05 DIAGNOSIS — H3530 Unspecified macular degeneration: Secondary | ICD-10-CM | POA: Diagnosis not present

## 2017-01-05 DIAGNOSIS — Z79899 Other long term (current) drug therapy: Secondary | ICD-10-CM | POA: Diagnosis not present

## 2017-01-05 DIAGNOSIS — I10 Essential (primary) hypertension: Secondary | ICD-10-CM | POA: Diagnosis not present

## 2017-01-05 DIAGNOSIS — Z951 Presence of aortocoronary bypass graft: Secondary | ICD-10-CM | POA: Diagnosis not present

## 2017-01-11 DIAGNOSIS — Z952 Presence of prosthetic heart valve: Secondary | ICD-10-CM | POA: Diagnosis not present

## 2017-01-11 DIAGNOSIS — Z955 Presence of coronary angioplasty implant and graft: Secondary | ICD-10-CM | POA: Diagnosis not present

## 2017-01-12 DIAGNOSIS — E088 Diabetes mellitus due to underlying condition with unspecified complications: Secondary | ICD-10-CM | POA: Diagnosis not present

## 2017-01-12 DIAGNOSIS — I10 Essential (primary) hypertension: Secondary | ICD-10-CM | POA: Diagnosis not present

## 2017-01-12 DIAGNOSIS — I2510 Atherosclerotic heart disease of native coronary artery without angina pectoris: Secondary | ICD-10-CM | POA: Diagnosis not present

## 2017-01-12 DIAGNOSIS — I480 Paroxysmal atrial fibrillation: Secondary | ICD-10-CM | POA: Diagnosis not present

## 2017-01-16 DIAGNOSIS — Z955 Presence of coronary angioplasty implant and graft: Secondary | ICD-10-CM | POA: Diagnosis not present

## 2017-01-16 DIAGNOSIS — Z952 Presence of prosthetic heart valve: Secondary | ICD-10-CM | POA: Diagnosis not present

## 2017-01-18 DIAGNOSIS — Z952 Presence of prosthetic heart valve: Secondary | ICD-10-CM | POA: Diagnosis not present

## 2017-01-18 DIAGNOSIS — Z955 Presence of coronary angioplasty implant and graft: Secondary | ICD-10-CM | POA: Diagnosis not present

## 2017-01-20 DIAGNOSIS — Z952 Presence of prosthetic heart valve: Secondary | ICD-10-CM | POA: Diagnosis not present

## 2017-01-20 DIAGNOSIS — Z955 Presence of coronary angioplasty implant and graft: Secondary | ICD-10-CM | POA: Diagnosis not present

## 2017-01-23 DIAGNOSIS — Z952 Presence of prosthetic heart valve: Secondary | ICD-10-CM | POA: Diagnosis not present

## 2017-01-23 DIAGNOSIS — Z955 Presence of coronary angioplasty implant and graft: Secondary | ICD-10-CM | POA: Diagnosis not present

## 2017-01-25 DIAGNOSIS — Z952 Presence of prosthetic heart valve: Secondary | ICD-10-CM | POA: Diagnosis not present

## 2017-01-25 DIAGNOSIS — Z955 Presence of coronary angioplasty implant and graft: Secondary | ICD-10-CM | POA: Diagnosis not present

## 2017-01-26 DIAGNOSIS — E088 Diabetes mellitus due to underlying condition with unspecified complications: Secondary | ICD-10-CM | POA: Diagnosis not present

## 2017-01-26 DIAGNOSIS — I10 Essential (primary) hypertension: Secondary | ICD-10-CM | POA: Diagnosis not present

## 2017-01-26 DIAGNOSIS — I2510 Atherosclerotic heart disease of native coronary artery without angina pectoris: Secondary | ICD-10-CM | POA: Diagnosis not present

## 2017-01-26 DIAGNOSIS — I480 Paroxysmal atrial fibrillation: Secondary | ICD-10-CM | POA: Diagnosis not present

## 2017-01-27 DIAGNOSIS — Z955 Presence of coronary angioplasty implant and graft: Secondary | ICD-10-CM | POA: Diagnosis not present

## 2017-01-27 DIAGNOSIS — Z952 Presence of prosthetic heart valve: Secondary | ICD-10-CM | POA: Diagnosis not present

## 2017-01-30 DIAGNOSIS — Z955 Presence of coronary angioplasty implant and graft: Secondary | ICD-10-CM | POA: Diagnosis not present

## 2017-01-30 DIAGNOSIS — Z952 Presence of prosthetic heart valve: Secondary | ICD-10-CM | POA: Diagnosis not present

## 2017-02-01 DIAGNOSIS — I2510 Atherosclerotic heart disease of native coronary artery without angina pectoris: Secondary | ICD-10-CM | POA: Diagnosis not present

## 2017-02-09 DIAGNOSIS — Z955 Presence of coronary angioplasty implant and graft: Secondary | ICD-10-CM | POA: Diagnosis not present

## 2017-02-09 DIAGNOSIS — Z952 Presence of prosthetic heart valve: Secondary | ICD-10-CM | POA: Diagnosis not present

## 2017-04-18 DIAGNOSIS — I10 Essential (primary) hypertension: Secondary | ICD-10-CM | POA: Diagnosis not present

## 2017-04-18 DIAGNOSIS — E088 Diabetes mellitus due to underlying condition with unspecified complications: Secondary | ICD-10-CM | POA: Diagnosis not present

## 2017-04-18 DIAGNOSIS — I2510 Atherosclerotic heart disease of native coronary artery without angina pectoris: Secondary | ICD-10-CM | POA: Diagnosis not present

## 2017-04-18 DIAGNOSIS — I480 Paroxysmal atrial fibrillation: Secondary | ICD-10-CM | POA: Diagnosis not present

## 2017-04-18 DIAGNOSIS — E785 Hyperlipidemia, unspecified: Secondary | ICD-10-CM | POA: Diagnosis not present

## 2017-04-29 DIAGNOSIS — E1129 Type 2 diabetes mellitus with other diabetic kidney complication: Secondary | ICD-10-CM | POA: Diagnosis not present

## 2017-04-29 DIAGNOSIS — I4891 Unspecified atrial fibrillation: Secondary | ICD-10-CM | POA: Diagnosis not present

## 2017-04-29 DIAGNOSIS — E291 Testicular hypofunction: Secondary | ICD-10-CM | POA: Diagnosis not present

## 2017-04-29 DIAGNOSIS — Z79899 Other long term (current) drug therapy: Secondary | ICD-10-CM | POA: Diagnosis not present

## 2017-04-29 DIAGNOSIS — E785 Hyperlipidemia, unspecified: Secondary | ICD-10-CM | POA: Diagnosis not present

## 2017-04-29 DIAGNOSIS — Z0000 Encounter for general adult medical examination without abnormal findings: Secondary | ICD-10-CM | POA: Diagnosis not present

## 2017-04-29 DIAGNOSIS — I10 Essential (primary) hypertension: Secondary | ICD-10-CM | POA: Diagnosis not present

## 2017-05-05 DIAGNOSIS — D649 Anemia, unspecified: Secondary | ICD-10-CM | POA: Diagnosis not present

## 2017-05-25 DIAGNOSIS — D509 Iron deficiency anemia, unspecified: Secondary | ICD-10-CM | POA: Diagnosis not present

## 2017-06-02 DIAGNOSIS — D638 Anemia in other chronic diseases classified elsewhere: Secondary | ICD-10-CM | POA: Diagnosis not present

## 2017-06-02 DIAGNOSIS — D509 Iron deficiency anemia, unspecified: Secondary | ICD-10-CM | POA: Diagnosis not present

## 2017-06-23 DIAGNOSIS — D509 Iron deficiency anemia, unspecified: Secondary | ICD-10-CM | POA: Diagnosis not present

## 2017-06-23 DIAGNOSIS — Z8042 Family history of malignant neoplasm of prostate: Secondary | ICD-10-CM | POA: Diagnosis not present

## 2017-06-23 DIAGNOSIS — I2510 Atherosclerotic heart disease of native coronary artery without angina pectoris: Secondary | ICD-10-CM | POA: Diagnosis not present

## 2017-07-13 DIAGNOSIS — D500 Iron deficiency anemia secondary to blood loss (chronic): Secondary | ICD-10-CM | POA: Diagnosis not present

## 2017-07-20 DIAGNOSIS — D500 Iron deficiency anemia secondary to blood loss (chronic): Secondary | ICD-10-CM | POA: Diagnosis not present

## 2017-07-29 DIAGNOSIS — E1129 Type 2 diabetes mellitus with other diabetic kidney complication: Secondary | ICD-10-CM | POA: Diagnosis not present

## 2017-07-29 DIAGNOSIS — I4891 Unspecified atrial fibrillation: Secondary | ICD-10-CM | POA: Diagnosis not present

## 2017-07-29 DIAGNOSIS — I10 Essential (primary) hypertension: Secondary | ICD-10-CM | POA: Diagnosis not present

## 2017-07-29 DIAGNOSIS — D509 Iron deficiency anemia, unspecified: Secondary | ICD-10-CM | POA: Diagnosis not present

## 2017-07-29 DIAGNOSIS — E291 Testicular hypofunction: Secondary | ICD-10-CM | POA: Diagnosis not present

## 2017-07-29 DIAGNOSIS — E785 Hyperlipidemia, unspecified: Secondary | ICD-10-CM | POA: Diagnosis not present

## 2017-08-26 DIAGNOSIS — N179 Acute kidney failure, unspecified: Secondary | ICD-10-CM | POA: Diagnosis not present

## 2017-08-26 DIAGNOSIS — E039 Hypothyroidism, unspecified: Secondary | ICD-10-CM | POA: Diagnosis not present

## 2017-10-02 DIAGNOSIS — Z6827 Body mass index (BMI) 27.0-27.9, adult: Secondary | ICD-10-CM | POA: Diagnosis not present

## 2017-10-02 DIAGNOSIS — M5412 Radiculopathy, cervical region: Secondary | ICD-10-CM | POA: Diagnosis not present

## 2017-10-02 DIAGNOSIS — M542 Cervicalgia: Secondary | ICD-10-CM | POA: Diagnosis not present

## 2017-10-04 DIAGNOSIS — M47812 Spondylosis without myelopathy or radiculopathy, cervical region: Secondary | ICD-10-CM | POA: Diagnosis not present

## 2017-10-04 DIAGNOSIS — M5412 Radiculopathy, cervical region: Secondary | ICD-10-CM | POA: Diagnosis not present

## 2017-10-18 DIAGNOSIS — M47812 Spondylosis without myelopathy or radiculopathy, cervical region: Secondary | ICD-10-CM | POA: Diagnosis not present

## 2017-10-28 DIAGNOSIS — E291 Testicular hypofunction: Secondary | ICD-10-CM | POA: Diagnosis not present

## 2017-10-28 DIAGNOSIS — M542 Cervicalgia: Secondary | ICD-10-CM | POA: Diagnosis not present

## 2017-10-28 DIAGNOSIS — I10 Essential (primary) hypertension: Secondary | ICD-10-CM | POA: Diagnosis not present

## 2017-10-28 DIAGNOSIS — E785 Hyperlipidemia, unspecified: Secondary | ICD-10-CM | POA: Diagnosis not present

## 2017-10-28 DIAGNOSIS — E1129 Type 2 diabetes mellitus with other diabetic kidney complication: Secondary | ICD-10-CM | POA: Diagnosis not present

## 2017-10-28 DIAGNOSIS — D509 Iron deficiency anemia, unspecified: Secondary | ICD-10-CM | POA: Diagnosis not present

## 2017-10-28 DIAGNOSIS — Z23 Encounter for immunization: Secondary | ICD-10-CM | POA: Diagnosis not present

## 2017-10-28 DIAGNOSIS — I4891 Unspecified atrial fibrillation: Secondary | ICD-10-CM | POA: Diagnosis not present

## 2017-10-28 DIAGNOSIS — Z79899 Other long term (current) drug therapy: Secondary | ICD-10-CM | POA: Diagnosis not present

## 2017-10-28 DIAGNOSIS — M47812 Spondylosis without myelopathy or radiculopathy, cervical region: Secondary | ICD-10-CM | POA: Diagnosis not present

## 2017-11-01 DIAGNOSIS — M5412 Radiculopathy, cervical region: Secondary | ICD-10-CM | POA: Diagnosis not present

## 2017-11-23 ENCOUNTER — Ambulatory Visit (INDEPENDENT_AMBULATORY_CARE_PROVIDER_SITE_OTHER): Payer: BLUE CROSS/BLUE SHIELD | Admitting: Cardiology

## 2017-11-23 ENCOUNTER — Encounter: Payer: Self-pay | Admitting: Cardiology

## 2017-11-23 ENCOUNTER — Telehealth: Payer: Self-pay | Admitting: Cardiology

## 2017-11-23 VITALS — BP 128/76 | HR 64 | Ht 69.0 in | Wt 177.0 lb

## 2017-11-23 DIAGNOSIS — E785 Hyperlipidemia, unspecified: Secondary | ICD-10-CM

## 2017-11-23 DIAGNOSIS — I10 Essential (primary) hypertension: Secondary | ICD-10-CM

## 2017-11-23 DIAGNOSIS — I2510 Atherosclerotic heart disease of native coronary artery without angina pectoris: Secondary | ICD-10-CM | POA: Diagnosis not present

## 2017-11-23 DIAGNOSIS — E088 Diabetes mellitus due to underlying condition with unspecified complications: Secondary | ICD-10-CM

## 2017-11-23 DIAGNOSIS — I480 Paroxysmal atrial fibrillation: Secondary | ICD-10-CM

## 2017-11-23 NOTE — Patient Instructions (Signed)
Medication Instructions:  Your physician recommends that you continue on your current medications as directed. Please refer to the Current Medication list given to you today.  Labwork: None  Testing/Procedures: None  Follow-Up: Your physician recommends that you schedule a follow-up appointment in: 6 months  Any Other Special Instructions Will Be Listed Below (If Applicable).   We will request labs from PCP office  If you need a refill on your cardiac medications before your next appointment, please call your pharmacy.   [place], RN, BSN

## 2017-11-23 NOTE — Telephone Encounter (Signed)
Requested patient to come in sooner and patient was agreeable.

## 2017-11-23 NOTE — Telephone Encounter (Signed)
Returning your call. °

## 2017-11-23 NOTE — Progress Notes (Signed)
Cardiology Office Note:    Date:  11/23/2017   ID:  Johnathan Murray, DOB 1951/11/14, MRN [id number]  PCP:  Patient, No Pcp Per  Cardiologist:  Jenean Lindau, MD   Referring MD: No ref. provider found    ASSESSMENT:    1. Coronary artery disease involving native coronary artery of native heart without angina pectoris   2. Essential hypertension   3. PAF (paroxysmal atrial fibrillation) ([place])   4. Diabetes mellitus due to underlying condition with complication, without long-term current use of insulin ([place])   5. Dyslipidemia    PLAN:    In order of problems listed above:  1. Secondary prevention stressed to the patient.  Importance of compliance with diet and medications stressed and he vocalized understanding.  The patient will evaluate about lipid-lowering medications.  He is not intolerant to statins as they give him myalgias.  I will review records from primary care physician's office with recent blood work records and advised him about other lipid-lowering medication such as Zetia or injectable medications.  Benefits and potential risks explained and he vocalized understanding. 2. He could go off Brilinta for a period of 5 days.  He prefers to wait until mid February when it will be 1 year post stenting and I respect his wishes.  At that time most certainly he can go off Brilinta for a period of 5-6 days and then resume it after his procedure as okayed by the Psychologist, sport and exercise.  Patient will need to continue his aspirin without interruption.  Because of his effort tolerance which is so good and he is asymptomatic he would not need any other evaluation. 3. Patient will be seen in follow-up appointment in 6 months or earlier if the patient has any concerns    Medication Adjustments/Labs and Tests Ordered: Current medicines are reviewed at length with the patient today.  Concerns regarding medicines are outlined above.  No orders of the defined types were placed in this encounter.  No  orders of the defined types were placed in this encounter.    History of Present Illness:    Johnathan Murray is a 67 y.o. male who is being seen today for the evaluation of coronary artery disease at the request of No ref. provider found.  Patient has had coronary artery bypass surgery in 2016 and in February 2017 he is undergone coronary stenting.  Subsequently he is done fine and he denies any problems.  No chest pain orthopnea or PND.  This patient has been under my care in my previous practice.  He is here now to transfer his care and be established with my current practice.  He has been a patient of mine since quite some time.  He tells me that he walks on a regular basis without any symptoms.  His orthopedic doctor is planning to give him steroid shots in his neck and he needs to be going off Brilinta for the same reason.  At the time of my evaluation, the patient is alert awake oriented and in no distress.  History reviewed. No pertinent past medical history.  History reviewed. No pertinent surgical history.  Current Medications: Current Meds  Medication Sig  . amLODipine (NORVASC) 10 MG tablet Take 10 mg by mouth daily.  . Ascorbic Acid (VITAMIN C) 1000 MG tablet Take 500 mg by mouth.  Marland Kitchen aspirin EC 81 MG tablet Take 81 mg by mouth.  . benazepril (LOTENSIN) 40 MG tablet TAKE 1 TABLET BY MOUTH EVERYDAY AT  BEDTIME  . Cyanocobalamin (B-12) 2500 MCG TABS Take by mouth daily.  . furosemide (LASIX) 40 MG tablet Take 40 mg by mouth daily.  Marland Kitchen levothyroxine (SYNTHROID, LEVOTHROID) 50 MCG tablet Take 50 mcg by mouth daily.  . meloxicam (MOBIC) 7.5 MG tablet TAKE 1 (ONE) TABLET TWO TIMES DAILY WITH FOOD  . metFORMIN (GLUCOPHAGE) 1000 MG tablet Take 1,000 mg by mouth 2 (two) times daily.  . nitroGLYCERIN (NITROSTAT) 0.4 MG SL tablet PLACE 1 TABLET UNDER THE TONGUE EVERY FIVE MINUTES AS NEEDED FOR CHEST PAIN.  . sildenafil (VIAGRA) 100 MG tablet 50 mg as needed.  . testosterone cypionate  (DEPOTESTOSTERONE CYPIONATE) 200 MG/ML injection INJECT 2ML INTRAMUSCULARLY EVERY 2 WEEKS  . ticagrelor (BRILINTA) 90 MG TABS tablet Take 90 mg by mouth.     Allergies:   Patient has no allergy information on record.   Social History   Socioeconomic History  . Marital status: Married    Spouse name: None  . Number of children: None  . Years of education: None  . Highest education level: None  Social Needs  . Financial resource strain: None  . Food insecurity - worry: None  . Food insecurity - inability: None  . Transportation needs - medical: None  . Transportation needs - non-medical: None  Occupational History  . None  Tobacco Use  . Smoking status: Never Smoker  . Smokeless tobacco: Never Used  Substance and Sexual Activity  . Alcohol use: Yes    Alcohol/week: 1.2 - 1.8 oz    Types: 2 - 3 Cans of beer per week  . Drug use: No  . Sexual activity: None  Other Topics Concern  . None  Social History Narrative  . None     Family History: The patient's family history is not on file.  ROS:   Please see the history of present illness.    All other systems reviewed and are negative.  EKGs/Labs/Other Studies Reviewed:    The following studies were reviewed today: I reviewed previous office visit notes extensively and discussed with the patient.   Recent Labs: No results found for requested labs within last 8760 hours.  Recent Lipid Panel No results found for: CHOL, TRIG, HDL, CHOLHDL, VLDL, LDLCALC, LDLDIRECT  Physical Exam:    VS:  BP 128/76 (BP Location: Left Arm, Patient Position: Sitting, Cuff Size: Normal)   Pulse 64   Ht 5\' 9"  (1.753 m)   Wt 177 lb (80.3 kg)   SpO2 99%   BMI 26.14 kg/m     Wt Readings from Last 3 Encounters:  11/23/17 177 lb (80.3 kg)     GEN: Patient is in no acute distress HEENT: Normal NECK: No JVD; No carotid bruits LYMPHATICS: No lymphadenopathy CARDIAC: S1 S2 regular, 2/6 systolic murmur at the apex. RESPIRATORY:  Clear  to auscultation without rales, wheezing or rhonchi  ABDOMEN: Soft, non-tender, non-distended MUSCULOSKELETAL:  No edema; No deformity  SKIN: Warm and dry NEUROLOGIC:  Alert and oriented x 3 PSYCHIATRIC:  Normal affect    Signed, Jenean Lindau, MD  11/23/2017 4:07 PM    [facility]

## 2017-11-24 ENCOUNTER — Telehealth: Payer: Self-pay

## 2017-11-24 MED ORDER — EZETIMIBE 10 MG PO TABS: 10.0000 mg | ORAL_TABLET | Freq: Every day | ORAL | 1 refills | Status: DC

## 2017-11-24 NOTE — Addendum Note (Signed)
Addended by: Mattie Marlin on: 11/24/2017 10:39 AM   Modules accepted: Orders

## 2017-11-24 NOTE — Telephone Encounter (Signed)
Informed patient of new medication added for cholesterol. Patient is agreeable to the medication and follow up  blood work.

## 2018-01-27 DIAGNOSIS — D509 Iron deficiency anemia, unspecified: Secondary | ICD-10-CM | POA: Diagnosis not present

## 2018-01-27 DIAGNOSIS — E785 Hyperlipidemia, unspecified: Secondary | ICD-10-CM | POA: Diagnosis not present

## 2018-01-27 DIAGNOSIS — I10 Essential (primary) hypertension: Secondary | ICD-10-CM | POA: Diagnosis not present

## 2018-01-27 DIAGNOSIS — E1129 Type 2 diabetes mellitus with other diabetic kidney complication: Secondary | ICD-10-CM | POA: Diagnosis not present

## 2018-01-27 DIAGNOSIS — Z1331 Encounter for screening for depression: Secondary | ICD-10-CM | POA: Diagnosis not present

## 2018-01-27 DIAGNOSIS — Z9181 History of falling: Secondary | ICD-10-CM | POA: Diagnosis not present

## 2018-01-27 DIAGNOSIS — I4891 Unspecified atrial fibrillation: Secondary | ICD-10-CM | POA: Diagnosis not present

## 2018-01-27 DIAGNOSIS — E291 Testicular hypofunction: Secondary | ICD-10-CM | POA: Diagnosis not present

## 2018-02-02 ENCOUNTER — Telehealth: Payer: Self-pay

## 2018-02-02 NOTE — Telephone Encounter (Signed)
Patient called stating that his amiodarone was discontinued by Dr. Geraldo Pitter. Patient states that he feels his heart beating kind of "funny." Pulse was not high and he did not feel that he was having the sensation at the time. Patient will call on Monday to try to be seen as his work schedule is hectic. Advised to seek emergency medical attention if this sensation arises again and does not subside or causes him to be symptomatic. Educated patient regarding what symptoms to watch out for. Per DOD Dr. Agustin Cree this plan is appropriate.

## 2018-02-15 DIAGNOSIS — J392 Other diseases of pharynx: Secondary | ICD-10-CM | POA: Insufficient documentation

## 2018-02-15 DIAGNOSIS — M1990 Unspecified osteoarthritis, unspecified site: Secondary | ICD-10-CM

## 2018-02-15 DIAGNOSIS — K219 Gastro-esophageal reflux disease without esophagitis: Secondary | ICD-10-CM

## 2018-02-15 DIAGNOSIS — H3530 Unspecified macular degeneration: Secondary | ICD-10-CM | POA: Insufficient documentation

## 2018-02-15 HISTORY — DX: Other diseases of pharynx: J39.2

## 2018-02-15 HISTORY — DX: Unspecified osteoarthritis, unspecified site: M19.90

## 2018-02-15 HISTORY — DX: Gastro-esophageal reflux disease without esophagitis: K21.9

## 2018-02-15 HISTORY — DX: Unspecified macular degeneration: H35.30

## 2018-03-17 DIAGNOSIS — J208 Acute bronchitis due to other specified organisms: Secondary | ICD-10-CM | POA: Diagnosis not present

## 2018-03-17 DIAGNOSIS — R6889 Other general symptoms and signs: Secondary | ICD-10-CM | POA: Diagnosis not present

## 2018-03-17 DIAGNOSIS — J029 Acute pharyngitis, unspecified: Secondary | ICD-10-CM | POA: Diagnosis not present

## 2018-03-21 DIAGNOSIS — J208 Acute bronchitis due to other specified organisms: Secondary | ICD-10-CM | POA: Diagnosis not present

## 2018-04-10 ENCOUNTER — Encounter: Payer: Self-pay | Admitting: Cardiology

## 2018-04-10 ENCOUNTER — Ambulatory Visit (INDEPENDENT_AMBULATORY_CARE_PROVIDER_SITE_OTHER): Payer: BLUE CROSS/BLUE SHIELD | Admitting: Cardiology

## 2018-04-10 VITALS — BP 140/72 | HR 63 | Ht 69.0 in | Wt 176.8 lb

## 2018-04-10 DIAGNOSIS — E785 Hyperlipidemia, unspecified: Secondary | ICD-10-CM | POA: Diagnosis not present

## 2018-04-10 DIAGNOSIS — E088 Diabetes mellitus due to underlying condition with unspecified complications: Secondary | ICD-10-CM

## 2018-04-10 DIAGNOSIS — I480 Paroxysmal atrial fibrillation: Secondary | ICD-10-CM

## 2018-04-10 DIAGNOSIS — I10 Essential (primary) hypertension: Secondary | ICD-10-CM | POA: Diagnosis not present

## 2018-04-10 DIAGNOSIS — I2510 Atherosclerotic heart disease of native coronary artery without angina pectoris: Secondary | ICD-10-CM | POA: Diagnosis not present

## 2018-04-10 NOTE — Patient Instructions (Signed)
Medication Instructions:  Your physician recommends that you continue on your current medications as directed. Please refer to the Current Medication list given to you today.  Labwork: None  Testing/Procedures: Your physician has requested that you have a stress echocardiogram. For further information please visit [email or web address]. Please follow instruction sheet as given.  Your physician has recommended that you wear an event monitor. Event monitors are medical devices that record the heart's electrical activity. Doctors most often [place] these monitors to diagnose arrhythmias. Arrhythmias are problems with the speed or rhythm of the heartbeat. The monitor is a small, portable device. You can wear one while you do your normal daily activities. This is usually used to diagnose what is causing palpitations/syncope (passing out).  Follow-Up: Your physician recommends that you schedule a follow-up appointment in: 1 month  Any Other Special Instructions Will Be Listed Below (If Applicable).     If you need a refill on your cardiac medications before your next appointment, please call your pharmacy.   [place], RN, BSN   Cardiopulmonary Exercise Stress Test Cardiopulmonary exercise testing (CPET) is a test that checks how your heart and lungs react to exercise. This is called your exercise capacity. During this test, you will walk or run on a treadmill or pedal on a stationary bike while tests are done on your heart and lungs. You may have this test to:  See why you are short of breath.  Check for exercise intolerance.  See how your lungs work.  See how your heart works.  Check for how you are responding to a heart or lung rehabilitation program.  See if you have a heart or lung problem.  See if you are healthy enough to have surgery.  What happens before the procedure?  Follow instructions from your doctor about what you cannot eat or drink.  Ask your  doctor about changing or stopping your normal medicines. This is important if you take diabetes medicines or blood thinners.  Wear loose, comfortable clothing and shoes.  If you use an inhaler, bring it with you to the test. What happens during the procedure?  A blood pressure cuff will be placed on your arm.  Several stick-on patches (electrodes) will be placed on your chest. They will be attached to an electrocardiogram (EKG) machine.  A clip-on monitor that measures the amount of oxygen in your blood will be placed on your finger (pulse oximeter).  A clip will be placed on your nose and a mouthpiece will be placed in your mouth. This may be held in place with a headpiece. You will breathe through the mouthpiece during the test.  You will be asked to start exercising. You will be closely watched while you exercise.  The amount of effort for your exercise will be gradually increased.  During exercise, the test will measure: ? Your heart rate. ? Your heart rhythm. ? Your oxygen blood level. ? The amount of oxygen and carbon dioxide that you breathe out.  The test will end when: ? You have finished the test. ? You have reached your maximum ability to exercise. ? You have chest or leg pain, dizziness, or shortness of breath. The procedure may vary among doctors and hospitals. What happens after the procedure?  Your blood pressure and EKG will be checked to watch how you recover from the test. This information is not intended to replace advice given to you by your health care provider. Make sure you discuss any  questions you have with your health care provider. Document Released: 10/26/2009 Document Revised: 03/29/2016 Document Reviewed: 09/21/2015 Elsevier Interactive Patient Education  2018 [place].  Holter Monitoring A Holter monitor is a small device that is used to detect abnormal heart rhythms. It clips to your clothing and is connected by wires to flat, sticky disks  (electrodes) that attach to your chest. It is worn continuously for 24-48 hours. Follow these instructions at home:  Wear your Holter monitor at all times, even while exercising and sleeping, for as long as directed by your health care provider.  Make sure that the Holter monitor is safely clipped to your clothing or close to your body as recommended by your health care provider.  Do not get the monitor or wires wet.  Do not put body lotion or moisturizer on your chest.  Keep your skin clean.  Keep a diary of your daily activities, such as walking and doing chores. If you feel that your heartbeat is abnormal or that your heart is fluttering or skipping a beat: ? Record what you are doing when it happens. ? Record what time of day the symptoms occur.  Return your Holter monitor as directed by your health care provider.  Keep all follow-up visits as directed by your health care provider. This is important. Get help right away if:  You feel lightheaded or you faint.  You have trouble breathing.  You feel pain in your chest, upper arm, or jaw.  You feel sick to your stomach and your skin is pale, cool, or damp.  You heartbeat feels unusual or abnormal. This information is not intended to replace advice given to you by your health care provider. Make sure you discuss any questions you have with your health care provider. Document Released: 08/05/2004 Document Revised: 04/14/2016 Document Reviewed: 06/16/2014 Elsevier Interactive Patient Education  [employer].

## 2018-04-10 NOTE — Progress Notes (Signed)
Cardiology Office Note:    Date:  04/10/2018   ID:  Johnathan Murray, DOB 1951-04-02, MRN [id number]  PCP:  Nicoletta Dress, MD  Cardiologist:  Jenean Lindau, MD   Referring MD: Nicoletta Dress, MD    ASSESSMENT:    1. Coronary artery disease involving native coronary artery of native heart without angina pectoris   2. PAF (paroxysmal atrial fibrillation) ([place])   3. Essential hypertension   4. Diabetes mellitus due to underlying condition with complication, without long-term current use of insulin ([place])   5. Dyslipidemia    PLAN:    In order of problems listed above:  1. Secondary prevention stressed with the patient.  Importance of compliance with diet and medications stressed and he vocalized understanding. 2. His chest pain is atypical for coronary etiology however in a stress echo.  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 3. In view of his palpitations and history of paroxysmal atrial fibrillation I will do a one-month event monitor.  His recent lab work from his primary care physician was unremarkable and lipids were discussed with him. 4. Patient is on dual antiplatelet therapy and therefore he is not on anticoagulation at this time.  Upon review of his event monitor I will discuss these issues with him further. 5. 1 month follow-up or earlier if he has any concerns.   Medication Adjustments/Labs and Tests Ordered: Current medicines are reviewed at length with the patient today.  Concerns regarding medicines are outlined above.  Orders Placed This Encounter  Procedures  . CARDIAC EVENT MONITOR  . EKG 12-Lead  . ECHOCARDIOGRAM STRESS TEST   No orders of the defined types were placed in this encounter.    Chief Complaint  Patient presents with  . Follow-up  . Coronary Artery Disease     History of Present Illness:    Johnathan Murray is a 67 y.o. male.  The patient has known coronary artery disease, bypass surgery and  coronary stenting.Marland Kitchen  He has essential hypertension, diabetes mellitus.  He mentions to me that he occasionally has chest tightness.  No orthopnea or PND he also gives history suggestive of palpitations and possible irregular heartbeat.  He has history approximately fibrillation.  No dizziness or syncopal spells.  At the time of my evaluation, the patient is alert awake oriented and in no distress.  Past Medical History:  Diagnosis Date  . Abnormal LFTs (liver function tests) 10/31/2016  . Acid reflux 02/15/2018  . Coronary artery disease involving native coronary artery of native heart without angina pectoris 05/30/2016  . Diabetes mellitus due to underlying condition with unspecified complications ([place]) 02/27/8118  . Dyslipidemia 05/30/2016  . Essential hypertension 05/30/2016  . Hyperactive gag reflex 02/15/2018  . Macular degeneration 02/15/2018  . PAF (paroxysmal atrial fibrillation) ([place]) 05/30/2016    Past Surgical History:  Procedure Laterality Date  . CARDIAC SURGERY    . HAND SURGERY    . KNEE SURGERY      Current Medications: Current Meds  Medication Sig  . amLODipine (NORVASC) 10 MG tablet Take 1 tablet by mouth daily.  . Ascorbic Acid (VITAMIN C) 1000 MG tablet Take 500 mg by mouth daily.   Marland Kitchen aspirin EC 81 MG tablet Take 81 mg by mouth daily.   Marland Kitchen atorvastatin (LIPITOR) 80 MG tablet Take 1 tablet by mouth daily.  . benazepril (LOTENSIN) 40 MG tablet TAKE 1 TABLET BY MOUTH EVERYDAY AT BEDTIME  . Cyanocobalamin (B-12)  2500 MCG TABS Take by mouth daily.  . furosemide (LASIX) 40 MG tablet Take 40 mg by mouth daily.  Marland Kitchen levothyroxine (SYNTHROID, LEVOTHROID) 50 MCG tablet Take 50 mcg by mouth daily.  . meloxicam (MOBIC) 7.5 MG tablet TAKE 1 (ONE) TABLET TWO TIMES DAILY WITH FOOD  . metFORMIN (GLUCOPHAGE) 1000 MG tablet Take 1,000 mg by mouth 2 (two) times daily.  . nitroGLYCERIN (NITROSTAT) 0.4 MG SL tablet PLACE 1 TABLET UNDER THE TONGUE EVERY FIVE MINUTES AS NEEDED FOR CHEST PAIN.    . sildenafil (VIAGRA) 100 MG tablet 50 mg as needed.  . ticagrelor (BRILINTA) 90 MG TABS tablet Take 90 mg by mouth 2 (two) times daily.   . [DISCONTINUED] amLODipine (NORVASC) 10 MG tablet Take 10 mg by mouth daily.     Allergies:   Atorvastatin; Metoprolol; and Nebivolol   Social History   Socioeconomic History  . Marital status: Married    Spouse name: Not on file  . Number of children: Not on file  . Years of education: Not on file  . Highest education level: Not on file  Occupational History  . Not on file  Social Needs  . Financial resource strain: Not on file  . Food insecurity:    Worry: Not on file    Inability: Not on file  . Transportation needs:    Medical: Not on file    Non-medical: Not on file  Tobacco Use  . Smoking status: Never Smoker  . Smokeless tobacco: Never Used  Substance and Sexual Activity  . Alcohol use: Yes    Alcohol/week: 1.2 - 1.8 oz    Types: 2 - 3 Cans of beer per week  . Drug use: No  . Sexual activity: Not on file  Lifestyle  . Physical activity:    Days per week: Not on file    Minutes per session: Not on file  . Stress: Not on file  Relationships  . Social connections:    Talks on phone: Not on file    Gets together: Not on file    Attends religious service: Not on file    Active member of club or organization: Not on file    Attends meetings of clubs or organizations: Not on file    Relationship status: Not on file  Other Topics Concern  . Not on file  Social History Narrative  . Not on file     Family History: The patient's family history includes Heart disease in his maternal grandmother; Prostate cancer in his brother.  ROS:   Please see the history of present illness.    All other systems reviewed and are negative.  EKGs/Labs/Other Studies Reviewed:    The following studies were reviewed today: I discussed my findings with the patient.  EKG reveals sinus rhythm and nonspecific ST-T changes.   Recent  Labs: No results found for requested labs within last 8760 hours.  Recent Lipid Panel No results found for: CHOL, TRIG, HDL, CHOLHDL, VLDL, LDLCALC, LDLDIRECT  Physical Exam:    VS:  BP 140/72 (BP Location: Left Arm, Patient Position: Sitting, Cuff Size: Normal)   Pulse 63   Ht 5\' 9"  (1.753 m)   Wt 176 lb 12.8 oz (80.2 kg)   SpO2 98%   BMI 26.11 kg/m     Wt Readings from Last 3 Encounters:  04/10/18 176 lb 12.8 oz (80.2 kg)  11/23/17 177 lb (80.3 kg)     GEN: Patient is in no acute distress HEENT:  Normal NECK: No JVD; No carotid bruits LYMPHATICS: No lymphadenopathy CARDIAC: Hear sounds regular, 2/6 systolic murmur at the apex. RESPIRATORY:  Clear to auscultation without rales, wheezing or rhonchi  ABDOMEN: Soft, non-tender, non-distended MUSCULOSKELETAL:  No edema; No deformity  SKIN: Warm and dry NEUROLOGIC:  Alert and oriented x 3 PSYCHIATRIC:  Normal affect   Signed, Jenean Lindau, MD  04/10/2018 8:43 AM    [place]

## 2018-04-25 ENCOUNTER — Ambulatory Visit: Payer: BLUE CROSS/BLUE SHIELD

## 2018-04-25 ENCOUNTER — Other Ambulatory Visit: Payer: Self-pay | Admitting: Cardiology

## 2018-04-25 ENCOUNTER — Ambulatory Visit (HOSPITAL_BASED_OUTPATIENT_CLINIC_OR_DEPARTMENT_OTHER)
Admission: RE | Admit: 2018-04-25 | Discharge: 2018-04-25 | Disposition: A | Discharge status: Home / Self Care | Payer: BLUE CROSS/BLUE SHIELD | Source: Ambulatory Visit | Attending: Cardiology | Admitting: Cardiology

## 2018-04-25 DIAGNOSIS — I2510 Atherosclerotic heart disease of native coronary artery without angina pectoris: Secondary | ICD-10-CM | POA: Diagnosis not present

## 2018-04-25 DIAGNOSIS — I509 Heart failure, unspecified: Secondary | ICD-10-CM | POA: Diagnosis not present

## 2018-04-25 DIAGNOSIS — I110 Hypertensive heart disease with heart failure: Secondary | ICD-10-CM | POA: Insufficient documentation

## 2018-04-25 DIAGNOSIS — E785 Hyperlipidemia, unspecified: Secondary | ICD-10-CM | POA: Insufficient documentation

## 2018-04-25 DIAGNOSIS — I480 Paroxysmal atrial fibrillation: Secondary | ICD-10-CM

## 2018-04-25 MED ORDER — PERFLUTREN LIPID MICROSPHERE
1.0000 mL | INTRAVENOUS | Status: AC | PRN
  Administered 2018-04-26: 4 mL via INTRAVENOUS
  Filled 2018-04-25: qty 10

## 2018-04-25 NOTE — Progress Notes (Addendum)
Echocardiogram 2D Echocardiogram with contrast has been performed. Stress test order was changed per revankar's request to access the prosthetic aortic valve. Stress is to be scheduled at a later date.  Johnathan Murray 04/25/2018, 3:46 PM

## 2018-04-26 ENCOUNTER — Telehealth: Payer: Self-pay

## 2018-04-26 DIAGNOSIS — I2510 Atherosclerotic heart disease of native coronary artery without angina pectoris: Secondary | ICD-10-CM

## 2018-04-26 DIAGNOSIS — I480 Paroxysmal atrial fibrillation: Secondary | ICD-10-CM

## 2018-04-26 NOTE — Telephone Encounter (Signed)
Contacted patient and advised that lexiscan stress test scheduled at the [facility] location 05/01/18 at 10:15 am. Address given to patient. Advised patient that he may want to arrive earlier than 10 am due to finding a parking space. Reviewed all instructions with medication. Advised patient that letter with instructions would be mailed to his home address. Patient also wrote down instructions in case letter does not make it to his house before Tuesday. Patient verbalized understanding. No further questions.

## 2018-04-26 NOTE — Addendum Note (Signed)
Addended by: Jossie Ng on: 04/26/2018 09:47 AM   Modules accepted: Orders

## 2018-04-26 NOTE — Telephone Encounter (Signed)
-----   Message from Jenean Lindau, MD sent at 04/25/2018  4:57 PM EDT ----- Patient came in for a stress echo but because of the degree of aortic stenosis instead he was switched to a regular echocardiogram.  Please let him know the results of the stress echocardiogram.  Please set him up for exercise stress Cardiolite. Jenean Lindau, MD 04/25/2018 4:57 PM

## 2018-04-26 NOTE — Telephone Encounter (Signed)
Contacted patient regarding result of echocardiogram and that Dr. Geraldo Pitter gave order for lexiscan stress test. Patient concerned over why his aortic valve that was replaced a year and a half ago has already stenosed. Patient also states that he is concerned about having the [place]. Patient states that he was told that if he had this type of stress test that his stents may show up as a concern. Advised patient would discuss his concerns with Dr. Geraldo Pitter and return call.  When reviewing with Dr. Geraldo Pitter, he states that he will contact the patient to explain everything to him. Dr. Geraldo Pitter states that the patient did agree to a lexiscan stress test.  Contacted patient, he prefers to have [place] done at one of our office locations in [place]. Advised patient would return call with appointment date and time and instructions. Patient verbalized understanding. No further questions.

## 2018-04-28 DIAGNOSIS — D509 Iron deficiency anemia, unspecified: Secondary | ICD-10-CM | POA: Diagnosis not present

## 2018-04-28 DIAGNOSIS — E291 Testicular hypofunction: Secondary | ICD-10-CM | POA: Diagnosis not present

## 2018-04-28 DIAGNOSIS — D638 Anemia in other chronic diseases classified elsewhere: Secondary | ICD-10-CM | POA: Diagnosis not present

## 2018-04-28 DIAGNOSIS — I4891 Unspecified atrial fibrillation: Secondary | ICD-10-CM | POA: Diagnosis not present

## 2018-05-01 ENCOUNTER — Ambulatory Visit (HOSPITAL_COMMUNITY): Payer: BLUE CROSS/BLUE SHIELD | Attending: Cardiovascular Disease

## 2018-05-01 DIAGNOSIS — R9439 Abnormal result of other cardiovascular function study: Secondary | ICD-10-CM | POA: Insufficient documentation

## 2018-05-01 DIAGNOSIS — I2510 Atherosclerotic heart disease of native coronary artery without angina pectoris: Secondary | ICD-10-CM | POA: Diagnosis not present

## 2018-05-01 DIAGNOSIS — I480 Paroxysmal atrial fibrillation: Secondary | ICD-10-CM | POA: Insufficient documentation

## 2018-05-01 LAB — MYOCARDIAL PERFUSION IMAGING
LV dias vol: 168 mL (ref 62–150)
LV sys vol: 77 mL
Peak HR: 89 {beats}/min
RATE: 0.27
Rest HR: 55 {beats}/min
SDS: 4
SRS: 7
SSS: 11
TID: 1.22

## 2018-05-01 MED ORDER — TECHNETIUM TC 99M TETROFOSMIN IV KIT
10.9000 | PACK | Freq: Once | INTRAVENOUS | Status: AC | PRN
  Administered 2018-05-01: 10.9 via INTRAVENOUS
  Filled 2018-05-01: qty 11

## 2018-05-01 MED ORDER — REGADENOSON 0.4 MG/5ML IV SOLN
0.4000 mg | Freq: Once | INTRAVENOUS | Status: AC
  Administered 2018-05-01: 0.4 mg via INTRAVENOUS

## 2018-05-01 MED ORDER — TECHNETIUM TC 99M TETROFOSMIN IV KIT
31.5000 | PACK | Freq: Once | INTRAVENOUS | Status: AC | PRN
  Administered 2018-05-01: 31.5 via INTRAVENOUS
  Filled 2018-05-01: qty 32

## 2018-05-03 ENCOUNTER — Ambulatory Visit: Payer: BLUE CROSS/BLUE SHIELD

## 2018-05-03 ENCOUNTER — Ambulatory Visit (HOSPITAL_BASED_OUTPATIENT_CLINIC_OR_DEPARTMENT_OTHER)
Admission: RE | Admit: 2018-05-03 | Discharge: 2018-05-03 | Disposition: A | Discharge status: Home / Self Care | Payer: BLUE CROSS/BLUE SHIELD | Source: Ambulatory Visit | Attending: Cardiology | Admitting: Cardiology

## 2018-05-03 ENCOUNTER — Ambulatory Visit (INDEPENDENT_AMBULATORY_CARE_PROVIDER_SITE_OTHER): Payer: BLUE CROSS/BLUE SHIELD | Admitting: Cardiology

## 2018-05-03 ENCOUNTER — Encounter: Payer: Self-pay | Admitting: Cardiology

## 2018-05-03 VITALS — BP 142/84 | HR 65 | Ht 69.0 in | Wt 176.8 lb

## 2018-05-03 DIAGNOSIS — E088 Diabetes mellitus due to underlying condition with unspecified complications: Secondary | ICD-10-CM | POA: Diagnosis not present

## 2018-05-03 DIAGNOSIS — R9439 Abnormal result of other cardiovascular function study: Secondary | ICD-10-CM | POA: Diagnosis not present

## 2018-05-03 DIAGNOSIS — I350 Nonrheumatic aortic (valve) stenosis: Secondary | ICD-10-CM

## 2018-05-03 DIAGNOSIS — J449 Chronic obstructive pulmonary disease, unspecified: Secondary | ICD-10-CM | POA: Diagnosis not present

## 2018-05-03 DIAGNOSIS — I480 Paroxysmal atrial fibrillation: Secondary | ICD-10-CM

## 2018-05-03 DIAGNOSIS — E785 Hyperlipidemia, unspecified: Secondary | ICD-10-CM

## 2018-05-03 DIAGNOSIS — Z01810 Encounter for preprocedural cardiovascular examination: Secondary | ICD-10-CM

## 2018-05-03 DIAGNOSIS — I10 Essential (primary) hypertension: Secondary | ICD-10-CM | POA: Diagnosis not present

## 2018-05-03 DIAGNOSIS — T82857A Stenosis of cardiac prosthetic devices, implants and grafts, initial encounter: Secondary | ICD-10-CM | POA: Diagnosis not present

## 2018-05-03 DIAGNOSIS — I2510 Atherosclerotic heart disease of native coronary artery without angina pectoris: Secondary | ICD-10-CM | POA: Insufficient documentation

## 2018-05-03 HISTORY — DX: Nonrheumatic aortic (valve) stenosis: I35.0

## 2018-05-03 HISTORY — DX: Abnormal result of other cardiovascular function study: R94.39

## 2018-05-03 NOTE — H&P (View-Only) (Signed)
Cardiology Office Note:    Date:  05/03/2018   ID:  Johnathan Murray, DOB Sep 27, 1951, MRN [id number]  PCP:  Nicoletta Dress, MD  Cardiologist:  Jenean Lindau, MD   Referring MD: Nicoletta Dress, MD    ASSESSMENT:    1. Abnormal nuclear stress test   2. Coronary artery disease involving native coronary artery of native heart without angina pectoris   3. Essential hypertension   4. PAF (paroxysmal atrial fibrillation) ([place])   5. Diabetes mellitus due to underlying condition with complication, without long-term current use of insulin ([place])   6. Dyslipidemia   7. Preop cardiovascular exam   8. Stenosis of prosthetic aortic valve    PLAN:    In order of problems listed above:  1. Secondary prevention stressed with the patient.  Importance of compliance with diet and medications explained and he vocalized understanding. 2. His blood pressure is stable. 3. I discussed findings of the stress test and echocardiogram with him at length.I discussed coronary angiography and left heart catheterization with the patient at extensive length. Procedure, benefits and potential risks were explained. Patient had multiple questions which were answered to the patient's satisfaction. Patient agreed and consented for the procedure. Further recommendations will be made based on the findings of the coronary angiography. In the interim. The patient has any significant symptoms he knows to go to the nearest emergency room. 4. Coronary angiography in the left heart catheterization will also help [place] assess and get a second opinion about the aortic valve status. 5. Patient will be seen in follow-up appointment in 3 months or earlier if the patient has any concerns    Medication Adjustments/Labs and Tests Ordered: Current medicines are reviewed at length with the patient today.  Concerns regarding medicines are outlined above.  Orders Placed This Encounter  Procedures  . DG Chest 2 View  . Basic  metabolic panel  . CBC  . EKG 12-Lead   No orders of the defined types were placed in this encounter.    No chief complaint on file.    History of Present Illness:    Johnathan Murray is a 67 y.o. male.  The patient has known coronary artery disease post CABG surgery.  Subsequently he underwent coronary stenting.  With his CABG surgery he also had a bioprosthetic aortic valve.  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  His echocardiogram has revealed mild to moderate aortic stenosis.  He stress test was abnormal.  He has history of atrial fibrillation and is on anticoagulation for the same.  He is here for follow-up for the same reason.  His wife accompanies him for this visit.  At the time of my evaluation, the patient is alert awake oriented and in no distress.  Past Medical History:  Diagnosis Date  . Abnormal LFTs (liver function tests) 10/31/2016  . Acid reflux 02/15/2018  . Coronary artery disease involving native coronary artery of native heart without angina pectoris 05/30/2016  . Diabetes mellitus due to underlying condition with unspecified complications ([place]) 5/78/4696  . Dyslipidemia 05/30/2016  . Essential hypertension 05/30/2016  . Hyperactive gag reflex 02/15/2018  . Macular degeneration 02/15/2018  . PAF (paroxysmal atrial fibrillation) ([place]) 05/30/2016    Past Surgical History:  Procedure Laterality Date  . CARDIAC SURGERY    . HAND SURGERY    . KNEE SURGERY      Current Medications: Current Meds  Medication Sig  . amLODipine (NORVASC) 10 MG tablet Take  1 tablet by mouth daily.  . Ascorbic Acid (VITAMIN C) 1000 MG tablet Take 500 mg by mouth daily.   Marland Kitchen aspirin EC 81 MG tablet Take 81 mg by mouth daily.   . benazepril (LOTENSIN) 40 MG tablet TAKE 1 TABLET BY MOUTH EVERYDAY AT BEDTIME  . Cyanocobalamin (B-12) 2500 MCG TABS Take 5,000 mcg by mouth daily.   . furosemide (LASIX) 40 MG tablet Take 40 mg by mouth daily.  Marland Kitchen levothyroxine (SYNTHROID, LEVOTHROID)  50 MCG tablet Take 50 mcg by mouth daily.  . meloxicam (MOBIC) 7.5 MG tablet TAKE 1 (ONE) TABLET TWO TIMES DAILY WITH FOOD  . metFORMIN (GLUCOPHAGE) 1000 MG tablet Take 1,000 mg by mouth 2 (two) times daily.  . nitroGLYCERIN (NITROSTAT) 0.4 MG SL tablet PLACE 1 TABLET UNDER THE TONGUE EVERY FIVE MINUTES AS NEEDED FOR CHEST PAIN.  . sildenafil (VIAGRA) 100 MG tablet 50 mg as needed.  . ticagrelor (BRILINTA) 90 MG TABS tablet Take 90 mg by mouth 2 (two) times daily.      Allergies:   Atorvastatin; Metoprolol; and Nebivolol   Social History   Socioeconomic History  . Marital status: Married    Spouse name: Not on file  . Number of children: Not on file  . Years of education: Not on file  . Highest education level: Not on file  Occupational History  . Not on file  Social Needs  . Financial resource strain: Not on file  . Food insecurity:    Worry: Not on file    Inability: Not on file  . Transportation needs:    Medical: Not on file    Non-medical: Not on file  Tobacco Use  . Smoking status: Never Smoker  . Smokeless tobacco: Never Used  Substance and Sexual Activity  . Alcohol use: Yes    Alcohol/week: 1.2 - 1.8 oz    Types: 2 - 3 Cans of beer per week  . Drug use: No  . Sexual activity: Not on file  Lifestyle  . Physical activity:    Days per week: Not on file    Minutes per session: Not on file  . Stress: Not on file  Relationships  . Social connections:    Talks on phone: Not on file    Gets together: Not on file    Attends religious service: Not on file    Active member of club or organization: Not on file    Attends meetings of clubs or organizations: Not on file    Relationship status: Not on file  Other Topics Concern  . Not on file  Social History Narrative  . Not on file     Family History: The patient's family history includes Heart disease in his maternal grandmother; Prostate cancer in his brother.  ROS:   Please see the history of present illness.     All other systems reviewed and are negative.  EKGs/Labs/Other Studies Reviewed:    The following studies were reviewed today: EKG reveals sinus rhythm and nonspecific ST-T changes.   Recent Labs: No results found for requested labs within last 8760 hours.  Recent Lipid Panel No results found for: CHOL, TRIG, HDL, CHOLHDL, VLDL, LDLCALC, LDLDIRECT  Physical Exam:    VS:  BP (!) 142/84 (BP Location: Left Arm, Patient Position: Sitting, Cuff Size: Normal)   Pulse 65   Ht 5\' 9"  (1.753 m)   Wt 176 lb 12.8 oz (80.2 kg)   SpO2 98%   BMI 26.11 kg/m  Wt Readings from Last 3 Encounters:  05/03/18 176 lb 12.8 oz (80.2 kg)  05/01/18 176 lb (79.8 kg)  04/10/18 176 lb 12.8 oz (80.2 kg)     GEN: Patient is in no acute distress HEENT: Normal NECK: No JVD; No carotid bruits LYMPHATICS: No lymphadenopathy CARDIAC: Hear sounds regular, 2/6 systolic murmur at the apex. RESPIRATORY:  Clear to auscultation without rales, wheezing or rhonchi  ABDOMEN: Soft, non-tender, non-distended MUSCULOSKELETAL:  No edema; No deformity  SKIN: Warm and dry NEUROLOGIC:  Alert and oriented x 3 PSYCHIATRIC:  Normal affect   Signed, Jenean Lindau, MD  05/03/2018 10:46 AM    [place]

## 2018-05-03 NOTE — Progress Notes (Signed)
Cardiology Office Note:    Date:  05/03/2018   ID:  Johnathan Murray, DOB 06/19/1951, MRN [id number]  PCP:  Nicoletta Dress, MD  Cardiologist:  Jenean Lindau, MD   Referring MD: Nicoletta Dress, MD    ASSESSMENT:    1. Abnormal nuclear stress test   2. Coronary artery disease involving native coronary artery of native heart without angina pectoris   3. Essential hypertension   4. PAF (paroxysmal atrial fibrillation) ([place])   5. Diabetes mellitus due to underlying condition with complication, without long-term current use of insulin ([place])   6. Dyslipidemia   7. Preop cardiovascular exam   8. Stenosis of prosthetic aortic valve    PLAN:    In order of problems listed above:  1. Secondary prevention stressed with the patient.  Importance of compliance with diet and medications explained and he vocalized understanding. 2. His blood pressure is stable. 3. I discussed findings of the stress test and echocardiogram with him at length.I discussed coronary angiography and left heart catheterization with the patient at extensive length. Procedure, benefits and potential risks were explained. Patient had multiple questions which were answered to the patient's satisfaction. Patient agreed and consented for the procedure. Further recommendations will be made based on the findings of the coronary angiography. In the interim. The patient has any significant symptoms he knows to go to the nearest emergency room. 4. Coronary angiography in the left heart catheterization will also help [place] assess and get a second opinion about the aortic valve status. 5. Patient will be seen in follow-up appointment in 3 months or earlier if the patient has any concerns    Medication Adjustments/Labs and Tests Ordered: Current medicines are reviewed at length with the patient today.  Concerns regarding medicines are outlined above.  Orders Placed This Encounter  Procedures  . DG Chest 2 View  . Basic  metabolic panel  . CBC  . EKG 12-Lead   No orders of the defined types were placed in this encounter.    No chief complaint on file.    History of Present Illness:    Johnathan Murray is a 67 y.o. male.  The patient has known coronary artery disease post CABG surgery.  Subsequently he underwent coronary stenting.  With his CABG surgery he also had a bioprosthetic aortic valve.  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  His echocardiogram has revealed mild to moderate aortic stenosis.  He stress test was abnormal.  He has history of atrial fibrillation and is on anticoagulation for the same.  He is here for follow-up for the same reason.  His wife accompanies him for this visit.  At the time of my evaluation, the patient is alert awake oriented and in no distress.  Past Medical History:  Diagnosis Date  . Abnormal LFTs (liver function tests) 10/31/2016  . Acid reflux 02/15/2018  . Coronary artery disease involving native coronary artery of native heart without angina pectoris 05/30/2016  . Diabetes mellitus due to underlying condition with unspecified complications ([place]) 9/56/3875  . Dyslipidemia 05/30/2016  . Essential hypertension 05/30/2016  . Hyperactive gag reflex 02/15/2018  . Macular degeneration 02/15/2018  . PAF (paroxysmal atrial fibrillation) ([place]) 05/30/2016    Past Surgical History:  Procedure Laterality Date  . CARDIAC SURGERY    . HAND SURGERY    . KNEE SURGERY      Current Medications: Current Meds  Medication Sig  . amLODipine (NORVASC) 10 MG tablet Take  1 tablet by mouth daily.  . Ascorbic Acid (VITAMIN C) 1000 MG tablet Take 500 mg by mouth daily.   Marland Kitchen aspirin EC 81 MG tablet Take 81 mg by mouth daily.   . benazepril (LOTENSIN) 40 MG tablet TAKE 1 TABLET BY MOUTH EVERYDAY AT BEDTIME  . Cyanocobalamin (B-12) 2500 MCG TABS Take 5,000 mcg by mouth daily.   . furosemide (LASIX) 40 MG tablet Take 40 mg by mouth daily.  Marland Kitchen levothyroxine (SYNTHROID, LEVOTHROID)  50 MCG tablet Take 50 mcg by mouth daily.  . meloxicam (MOBIC) 7.5 MG tablet TAKE 1 (ONE) TABLET TWO TIMES DAILY WITH FOOD  . metFORMIN (GLUCOPHAGE) 1000 MG tablet Take 1,000 mg by mouth 2 (two) times daily.  . nitroGLYCERIN (NITROSTAT) 0.4 MG SL tablet PLACE 1 TABLET UNDER THE TONGUE EVERY FIVE MINUTES AS NEEDED FOR CHEST PAIN.  . sildenafil (VIAGRA) 100 MG tablet 50 mg as needed.  . ticagrelor (BRILINTA) 90 MG TABS tablet Take 90 mg by mouth 2 (two) times daily.      Allergies:   Atorvastatin; Metoprolol; and Nebivolol   Social History   Socioeconomic History  . Marital status: Married    Spouse name: Not on file  . Number of children: Not on file  . Years of education: Not on file  . Highest education level: Not on file  Occupational History  . Not on file  Social Needs  . Financial resource strain: Not on file  . Food insecurity:    Worry: Not on file    Inability: Not on file  . Transportation needs:    Medical: Not on file    Non-medical: Not on file  Tobacco Use  . Smoking status: Never Smoker  . Smokeless tobacco: Never Used  Substance and Sexual Activity  . Alcohol use: Yes    Alcohol/week: 1.2 - 1.8 oz    Types: 2 - 3 Cans of beer per week  . Drug use: No  . Sexual activity: Not on file  Lifestyle  . Physical activity:    Days per week: Not on file    Minutes per session: Not on file  . Stress: Not on file  Relationships  . Social connections:    Talks on phone: Not on file    Gets together: Not on file    Attends religious service: Not on file    Active member of club or organization: Not on file    Attends meetings of clubs or organizations: Not on file    Relationship status: Not on file  Other Topics Concern  . Not on file  Social History Narrative  . Not on file     Family History: The patient's family history includes Heart disease in his maternal grandmother; Prostate cancer in his brother.  ROS:   Please see the history of present illness.     All other systems reviewed and are negative.  EKGs/Labs/Other Studies Reviewed:    The following studies were reviewed today: EKG reveals sinus rhythm and nonspecific ST-T changes.   Recent Labs: No results found for requested labs within last 8760 hours.  Recent Lipid Panel No results found for: CHOL, TRIG, HDL, CHOLHDL, VLDL, LDLCALC, LDLDIRECT  Physical Exam:    VS:  BP (!) 142/84 (BP Location: Left Arm, Patient Position: Sitting, Cuff Size: Normal)   Pulse 65   Ht 5\' 9"  (1.753 m)   Wt 176 lb 12.8 oz (80.2 kg)   SpO2 98%   BMI 26.11 kg/m  Wt Readings from Last 3 Encounters:  05/03/18 176 lb 12.8 oz (80.2 kg)  05/01/18 176 lb (79.8 kg)  04/10/18 176 lb 12.8 oz (80.2 kg)     GEN: Patient is in no acute distress HEENT: Normal NECK: No JVD; No carotid bruits LYMPHATICS: No lymphadenopathy CARDIAC: Hear sounds regular, 2/6 systolic murmur at the apex. RESPIRATORY:  Clear to auscultation without rales, wheezing or rhonchi  ABDOMEN: Soft, non-tender, non-distended MUSCULOSKELETAL:  No edema; No deformity  SKIN: Warm and dry NEUROLOGIC:  Alert and oriented x 3 PSYCHIATRIC:  Normal affect   Signed, Jenean Lindau, MD  05/03/2018 10:46 AM    [place]

## 2018-05-03 NOTE — Patient Instructions (Signed)
Medication Instructions:  Your physician recommends that you continue on your current medications as directed. Please refer to the Current Medication list given to you today.  Labwork: Your physician recommends that you have the following labs drawn: CBC, BMP  Testing/Procedures: You had an EKG today.  A chest x-ray takes a picture of the organs and structures inside the chest, including the heart, lungs, and blood vessels. This test can show several things, including, whether the heart is enlarges; whether fluid is building up in the lungs; and whether pacemaker / defibrillator leads are still in place.  Your physician has requested that you have a cardiac catheterization. Cardiac catheterization is used to diagnose and/or treat various heart conditions. Doctors may recommend this procedure for a number of different reasons. The most common reason is to evaluate chest pain. Chest pain can be a symptom of coronary artery disease (CAD), and cardiac catheterization can show whether plaque is narrowing or blocking your heart's arteries. This procedure is also used to evaluate the valves, as well as measure the blood flow and oxygen levels in different parts of your heart. For further information please visit [email or web address]. Please follow instruction sheet, as given.    Johnathan Murray [facility] [address], [place] [address] Dept: [phone] Loc: [phone]  Johnathan Murray  05/03/2018  You are scheduled for a Cardiac Catheterization on Wednesday, June 26 with Dr. Lauree Chandler.  1. Please arrive at the [facility] (Main Entrance A) at [facility]: [place], [place] [address] at 9:00 AM (two hours before your procedure to ensure your preparation). Free valet parking service is available.   Special note: Every effort is made to have your procedure done on time. Please  understand that emergencies sometimes delay scheduled procedures.  2. Diet: Do not eat or drink anything after midnight prior to your procedure except sips of water to take medications.  3. Labs: We are checking today.  4. Medication instructions in preparation for your procedure:  Stop taking, Glucophage (Metformin) and furosemide (Lasix) on Wednesday, June 26.      On the morning of your procedure, take your Aspirin and any morning medicines NOT listed above.  You may use sips of water.  5. Plan for one night stay--bring personal belongings. 6. Bring a current list of your medications and current insurance cards. 7. You MUST have a responsible person to drive you home. 8. Someone MUST be with you the first 24 hours after you arrive home or your discharge will be delayed. 9. Please wear clothes that are easy to get on and off and wear slip-on shoes.  Thank you for allowing [place] to care for you!   -- [facility]  Follow-Up: Your physician recommends that you schedule a follow-up appointment in: 4 weeks.  Any Other Special Instructions Will Be Listed Below (If Applicable).     If you need a refill on your cardiac medications before your next appointment, please call your pharmacy.

## 2018-05-04 ENCOUNTER — Telehealth: Payer: Self-pay

## 2018-05-04 LAB — CBC
Hematocrit: 36.7 % — ABNORMAL LOW (ref 37.5–51.0)
Hemoglobin: 12.9 g/dL — ABNORMAL LOW (ref 13.0–17.7)
MCH: 33.2 pg — ABNORMAL HIGH (ref 26.6–33.0)
MCHC: 35.1 g/dL (ref 31.5–35.7)
MCV: 94 fL (ref 79–97)
Platelets: 217 10*3/uL (ref 150–450)
RBC: 3.89 x10E6/uL — ABNORMAL LOW (ref 4.14–5.80)
RDW: 15.1 % (ref 12.3–15.4)
WBC: 4.6 10*3/uL (ref 3.4–10.8)

## 2018-05-04 LAB — BASIC METABOLIC PANEL
BUN/Creatinine Ratio: 19 (ref 10–24)
BUN: 26 mg/dL (ref 8–27)
CO2: 24 mmol/L (ref 20–29)
Calcium: 10.2 mg/dL (ref 8.6–10.2)
Chloride: 103 mmol/L (ref 96–106)
Creatinine, Ser: 1.4 mg/dL — ABNORMAL HIGH (ref 0.76–1.27)
GFR calc Af Amer: 61 mL/min/{1.73_m2} (ref >59)
GFR calc non Af Amer: 52 mL/min/{1.73_m2} — ABNORMAL LOW (ref >59)
Glucose: 100 mg/dL — ABNORMAL HIGH (ref 65–99)
Potassium: 4.9 mmol/L (ref 3.5–5.2)
Sodium: 141 mmol/L (ref 134–144)

## 2018-05-04 LAB — SPECIMEN STATUS REPORT

## 2018-05-04 NOTE — Telephone Encounter (Signed)
-----   Message from Jenean Lindau, MD sent at 05/04/2018  8:38 AM EDT ----- Please inform the cath lab about these labs.  Please inform the patient also.  I Would like him to get the 4-hour hydration protocol.  I would also prefer that he does not get a left ventriculogram so we can restrict his dye use.  I only communicate this with the Cath Lab Jenean Lindau, MD 05/04/2018 8:37 AM

## 2018-05-04 NOTE — Telephone Encounter (Signed)
Contacted Santiago Glad in the cath lab regarding patient needing 5 hours of IV hydration prior to heart catheterization. Patient's appointment time moved to 1 pm, patient to arrive at 6:30 am. Date of June 26th is the same.   Patient informed of results. Advised that he will need to arrive at 6:30 am to receive IV hydration. Advised to hold furosemide, meloxicam, and benazepril the day before and the morning of the procedure. Patient verbalized understanding. No further questions.

## 2018-05-15 ENCOUNTER — Telehealth: Payer: Self-pay | Admitting: *Deleted

## 2018-05-15 NOTE — Telephone Encounter (Signed)
Pt contacted pre-catheterization scheduled at [facility] for: Wednesday May 16, 2018 1 PM Verified arrival time and place: [place] Entrance A at: 6:30 AM for pre-procedure hydration No solid food after midnight prior to cath, clear liquids until 5 AM day of procedure.  Hold: Metformin -day of procedure and 48 hours post procedure. Benazepril -day of procedure Furosemide -day of procedure Mobic-05/15/18 and 05/16/18 Sildenafil 24 hours pre-procedure  AM meds can be  taken pre-cath with sip of water including: ASA 81 mg Brilinta 90 mg  Confirmed patient has responsible person to drive home post procedure and observe patient for 24 hours: yes

## 2018-05-16 ENCOUNTER — Ambulatory Visit (HOSPITAL_COMMUNITY)
Admission: RE | Admit: 2018-05-16 | Discharge: 2018-05-16 | Disposition: A | Discharge status: Home / Self Care | Payer: BLUE CROSS/BLUE SHIELD | Source: Ambulatory Visit | Attending: Cardiovascular Disease | Admitting: Cardiovascular Disease

## 2018-05-16 ENCOUNTER — Encounter (HOSPITAL_COMMUNITY)
Admission: RE | Disposition: A | Discharge status: Home / Self Care | Payer: Self-pay | Source: Ambulatory Visit | Attending: Cardiovascular Disease

## 2018-05-16 DIAGNOSIS — Z7984 Long term (current) use of oral hypoglycemic drugs: Secondary | ICD-10-CM | POA: Diagnosis not present

## 2018-05-16 DIAGNOSIS — Z951 Presence of aortocoronary bypass graft: Secondary | ICD-10-CM | POA: Insufficient documentation

## 2018-05-16 DIAGNOSIS — I10 Essential (primary) hypertension: Secondary | ICD-10-CM | POA: Insufficient documentation

## 2018-05-16 DIAGNOSIS — K219 Gastro-esophageal reflux disease without esophagitis: Secondary | ICD-10-CM | POA: Insufficient documentation

## 2018-05-16 DIAGNOSIS — Z9889 Other specified postprocedural states: Secondary | ICD-10-CM | POA: Diagnosis not present

## 2018-05-16 DIAGNOSIS — Z79890 Hormone replacement therapy (postmenopausal): Secondary | ICD-10-CM | POA: Diagnosis not present

## 2018-05-16 DIAGNOSIS — Z8249 Family history of ischemic heart disease and other diseases of the circulatory system: Secondary | ICD-10-CM | POA: Insufficient documentation

## 2018-05-16 DIAGNOSIS — Z7982 Long term (current) use of aspirin: Secondary | ICD-10-CM | POA: Insufficient documentation

## 2018-05-16 DIAGNOSIS — E119 Type 2 diabetes mellitus without complications: Secondary | ICD-10-CM | POA: Insufficient documentation

## 2018-05-16 DIAGNOSIS — H3530 Unspecified macular degeneration: Secondary | ICD-10-CM | POA: Insufficient documentation

## 2018-05-16 DIAGNOSIS — Z888 Allergy status to other drugs, medicaments and biological substances status: Secondary | ICD-10-CM | POA: Diagnosis not present

## 2018-05-16 DIAGNOSIS — Z955 Presence of coronary angioplasty implant and graft: Secondary | ICD-10-CM | POA: Insufficient documentation

## 2018-05-16 DIAGNOSIS — Z01810 Encounter for preprocedural cardiovascular examination: Secondary | ICD-10-CM

## 2018-05-16 DIAGNOSIS — I25118 Atherosclerotic heart disease of native coronary artery with other forms of angina pectoris: Secondary | ICD-10-CM

## 2018-05-16 DIAGNOSIS — I480 Paroxysmal atrial fibrillation: Secondary | ICD-10-CM | POA: Insufficient documentation

## 2018-05-16 DIAGNOSIS — R9439 Abnormal result of other cardiovascular function study: Secondary | ICD-10-CM | POA: Diagnosis not present

## 2018-05-16 DIAGNOSIS — I350 Nonrheumatic aortic (valve) stenosis: Secondary | ICD-10-CM | POA: Diagnosis not present

## 2018-05-16 DIAGNOSIS — Z7901 Long term (current) use of anticoagulants: Secondary | ICD-10-CM | POA: Insufficient documentation

## 2018-05-16 DIAGNOSIS — I2582 Chronic total occlusion of coronary artery: Secondary | ICD-10-CM | POA: Insufficient documentation

## 2018-05-16 DIAGNOSIS — Z79899 Other long term (current) drug therapy: Secondary | ICD-10-CM | POA: Insufficient documentation

## 2018-05-16 DIAGNOSIS — E785 Hyperlipidemia, unspecified: Secondary | ICD-10-CM | POA: Diagnosis not present

## 2018-05-16 HISTORY — PX: LEFT HEART CATH AND CORS/GRAFTS ANGIOGRAPHY: CATH118250

## 2018-05-16 SURGERY — LEFT HEART CATH AND CORS/GRAFTS ANGIOGRAPHY
Anesthesia: LOCAL

## 2018-05-16 MED ORDER — FENTANYL CITRATE (PF) 100 MCG/2ML IJ SOLN
INTRAMUSCULAR | Status: AC
  Filled 2018-05-16: qty 2

## 2018-05-16 MED ORDER — LIDOCAINE HCL (PF) 1 % IJ SOLN
INTRAMUSCULAR | Status: DC | PRN
  Administered 2018-05-16: 2 mL

## 2018-05-16 MED ORDER — SODIUM CHLORIDE 0.9% FLUSH: 3.0000 mL | Freq: Two times a day (BID) | INTRAVENOUS | Status: DC

## 2018-05-16 MED ORDER — SODIUM CHLORIDE 0.9 % WEIGHT BASED INFUSION
3.0000 mL/kg/h | INTRAVENOUS | Status: AC
  Administered 2018-05-16: 3 mL/kg/h via INTRAVENOUS

## 2018-05-16 MED ORDER — SODIUM CHLORIDE 0.9 % WEIGHT BASED INFUSION: 1.0000 mL/kg/h | INTRAVENOUS | Status: DC

## 2018-05-16 MED ORDER — HEPARIN (PORCINE) IN NACL 2-0.9 UNITS/ML
INTRAMUSCULAR | Status: AC | PRN
  Administered 2018-05-16: 1000 mL

## 2018-05-16 MED ORDER — FENTANYL CITRATE (PF) 100 MCG/2ML IJ SOLN
INTRAMUSCULAR | Status: DC | PRN
  Administered 2018-05-16: 50 ug via INTRAVENOUS

## 2018-05-16 MED ORDER — IOPAMIDOL (ISOVUE-370) INJECTION 76%
INTRAVENOUS | Status: DC | PRN
  Administered 2018-05-16: 85 mL via INTRAVENOUS

## 2018-05-16 MED ORDER — ASPIRIN 81 MG PO CHEW: 81.0000 mg | CHEWABLE_TABLET | ORAL | Status: DC

## 2018-05-16 MED ORDER — HEPARIN SODIUM (PORCINE) 1000 UNIT/ML IJ SOLN
INTRAMUSCULAR | Status: AC
  Filled 2018-05-16: qty 1

## 2018-05-16 MED ORDER — SODIUM CHLORIDE 0.9 % IV SOLN: 250.0000 mL | INTRAVENOUS | Status: DC | PRN

## 2018-05-16 MED ORDER — HEPARIN (PORCINE) IN NACL 1000-0.9 UT/500ML-% IV SOLN
INTRAVENOUS | Status: AC
  Filled 2018-05-16: qty 1000

## 2018-05-16 MED ORDER — IOPAMIDOL (ISOVUE-370) INJECTION 76%
INTRAVENOUS | Status: AC
  Filled 2018-05-16: qty 100

## 2018-05-16 MED ORDER — MIDAZOLAM HCL 2 MG/2ML IJ SOLN
INTRAMUSCULAR | Status: DC | PRN
  Administered 2018-05-16: 2 mg via INTRAVENOUS

## 2018-05-16 MED ORDER — MIDAZOLAM HCL 2 MG/2ML IJ SOLN
INTRAMUSCULAR | Status: AC
  Filled 2018-05-16: qty 2

## 2018-05-16 MED ORDER — HEPARIN SODIUM (PORCINE) 1000 UNIT/ML IJ SOLN
INTRAMUSCULAR | Status: DC | PRN
  Administered 2018-05-16: 4000 [IU] via INTRAVENOUS

## 2018-05-16 MED ORDER — VERAPAMIL HCL 2.5 MG/ML IV SOLN
INTRAVENOUS | Status: DC | PRN
  Administered 2018-05-16: 10 mL via INTRA_ARTERIAL

## 2018-05-16 MED ORDER — VERAPAMIL HCL 2.5 MG/ML IV SOLN
INTRAVENOUS | Status: AC
  Filled 2018-05-16: qty 2

## 2018-05-16 MED ORDER — SODIUM CHLORIDE 0.9% FLUSH: 3.0000 mL | INTRAVENOUS | Status: DC | PRN

## 2018-05-16 MED ORDER — LIDOCAINE HCL (PF) 1 % IJ SOLN
INTRAMUSCULAR | Status: AC
  Filled 2018-05-16: qty 30

## 2018-05-16 SURGICAL SUPPLY — 12 items
CATH INFINITI 5 FR IM (CATHETERS) ×2 IMPLANT
CATH INFINITI 5FR MULTPACK ANG (CATHETERS) ×2 IMPLANT
DEVICE RAD COMP TR BAND LRG (VASCULAR PRODUCTS) ×2 IMPLANT
ELECT DEFIB PAD ADLT CADENCE (PAD) ×2 IMPLANT
GLIDESHEATH SLEND SS 6F .021 (SHEATH) ×2 IMPLANT
GUIDEWIRE INQWIRE 1.5J.035X260 (WIRE) ×1 IMPLANT
INQWIRE 1.5J .035X260CM (WIRE) ×2
KIT HEART LEFT (KITS) ×2 IMPLANT
PACK CARDIAC CATHETERIZATION (CUSTOM PROCEDURE TRAY) ×2 IMPLANT
TRANSDUCER W/STOPCOCK (MISCELLANEOUS) ×2 IMPLANT
TUBING CIL FLEX 10 FLL-RA (TUBING) ×2 IMPLANT
WIRE EMERALD ST .035X150CM (WIRE) ×2 IMPLANT

## 2018-05-16 NOTE — Discharge Instructions (Signed)
Resume   HOLD METFORMIN FOR A FULL 48 HOURS AFTER DISCHARGE.   DRINK PLENTY OF FLUIDS FOR THE NEXT 2-3 DAYS TO KEEP HYDRATED.  Radial Site Care Refer to this sheet in the next few weeks. These instructions provide you with information about caring for yourself after your procedure. Your health care provider may also give you more specific instructions. Your treatment has been planned according to current medical practices, but problems sometimes occur. Call your health care provider if you have any problems or questions after your procedure. What can I expect after the procedure? After your procedure, it is typical to have the following:  Bruising at the radial site that usually fades within 1-2 weeks.  Blood collecting in the tissue (hematoma) that may be painful to the touch. It should usually decrease in size and tenderness within 1-2 weeks.  Follow these instructions at home:  Take medicines only as directed by your health care provider.  You may shower 24-48 hours after the procedure or as directed by your health care provider. Remove the bandage (dressing) and gently wash the site with plain soap and water. Pat the area dry with a clean towel. Do not rub the site, because this may cause bleeding.  Do not take baths, swim, or use a hot tub until your health care provider approves.  Check your insertion site every day for redness, swelling, or drainage.  Do not apply powder or lotion to the site.  Do not flex or bend the affected arm for 24 hours or as directed by your health care provider.  Do not push or pull heavy objects with the affected arm for 24 hours or as directed by your health care provider.  Do not lift over 10 lb (4.5 kg) for 5 days after your procedure or as directed by your health care provider.  Ask your health care provider when it is okay to: ? Return to work or school. ? Resume usual physical activities or sports. ? Resume sexual activity.  Do not drive  home if you are discharged the same day as the procedure. Have someone else drive you.  You may drive 24 hours after the procedure unless otherwise instructed by your health care provider.  Do not operate machinery or power tools for 24 hours after the procedure.  If your procedure was done as an outpatient procedure, which means that you went home the same day as your procedure, a responsible adult should be with you for the first 24 hours after you arrive home.  Keep all follow-up visits as directed by your health care provider. This is important. Contact a health care provider if:  You have a fever.  You have chills.  You have increased bleeding from the radial site. Hold pressure on the site. Get help right away if:  You have unusual pain at the radial site.  You have redness, warmth, or swelling at the radial site.  You have drainage (other than a small amount of blood on the dressing) from the radial site.  The radial site is bleeding, and the bleeding does not stop after 30 minutes of holding steady pressure on the site.  Your arm or hand becomes pale, cool, tingly, or numb. This information is not intended to replace advice given to you by your health care provider. Make sure you discuss any questions you have with your health care provider. Document Released: 12/10/2010 Document Revised: 04/14/2016 Document Reviewed: 05/26/2014 Elsevier Interactive Patient Education  2018 Elsevier  Inc. ° °

## 2018-05-16 NOTE — Interval H&P Note (Signed)
History and Physical Interval Note:  05/16/2018 11:21 AM  Johnathan Murray  has presented today for cardiac cath with the diagnosis CAD s/p CABG, abnormal stress test  The various methods of treatment have been discussed with the patient and family. After consideration of risks, benefits and other options for treatment, the patient has consented to  Procedure(s): LEFT HEART CATH AND CORS/GRAFTS ANGIOGRAPHY (N/A) as a surgical intervention .  The patient's history has been reviewed, patient examined, no change in status, stable for surgery.  I have reviewed the patient's chart and labs.  Questions were answered to the patient's satisfaction.    Cath Lab Visit (complete for each Cath Lab visit)  Clinical Evaluation Leading to the Procedure:   ACS: No.  Non-ACS:    Anginal Classification: No Symptoms  Anti-ischemic medical therapy: Minimal Therapy (1 class of medications)  Non-Invasive Test Results: Intermediate-risk stress test findings: cardiac mortality 1-3%/year  Prior CABG: Previous CABG        Lauree Chandler

## 2018-05-17 ENCOUNTER — Other Ambulatory Visit: Payer: Self-pay

## 2018-05-17 ENCOUNTER — Encounter (HOSPITAL_COMMUNITY): Payer: Self-pay | Admitting: Cardiovascular Disease

## 2018-05-17 ENCOUNTER — Telehealth: Payer: Self-pay

## 2018-05-17 MED ORDER — RANOLAZINE ER 500 MG PO TB12: ORAL_TABLET | ORAL | 2 refills | Status: DC

## 2018-05-17 NOTE — Telephone Encounter (Signed)
Patient was informed of results and of the new medication that will be added. Patient requested that he do his cardiac rehab at [facility], order and records have been sent.

## 2018-05-17 NOTE — Telephone Encounter (Signed)
-----   Message from Jenean Lindau, MD sent at 05/16/2018  2:23 PM EDT ----- I would like him to start Ranexa 500 mg twice a day for 2 weeks and then 1 g twice a day.  Ask him if he has any issues tolerating it from past experience.  Can he start cardiac rehab? ----- Message ----- From: Burnell Blanks, MD Sent: 05/16/2018   1:10 PM To: Jenean Lindau, MD

## 2018-05-21 ENCOUNTER — Encounter: Payer: Self-pay | Admitting: Cardiology

## 2018-05-21 ENCOUNTER — Ambulatory Visit (INDEPENDENT_AMBULATORY_CARE_PROVIDER_SITE_OTHER): Payer: BLUE CROSS/BLUE SHIELD | Admitting: Cardiology

## 2018-05-21 VITALS — BP 132/68 | HR 71 | Ht 69.5 in | Wt 180.0 lb

## 2018-05-21 DIAGNOSIS — I10 Essential (primary) hypertension: Secondary | ICD-10-CM

## 2018-05-21 DIAGNOSIS — I480 Paroxysmal atrial fibrillation: Secondary | ICD-10-CM | POA: Diagnosis not present

## 2018-05-21 DIAGNOSIS — E785 Hyperlipidemia, unspecified: Secondary | ICD-10-CM | POA: Diagnosis not present

## 2018-05-21 DIAGNOSIS — E088 Diabetes mellitus due to underlying condition with unspecified complications: Secondary | ICD-10-CM

## 2018-05-21 DIAGNOSIS — I2510 Atherosclerotic heart disease of native coronary artery without angina pectoris: Secondary | ICD-10-CM

## 2018-05-21 NOTE — Patient Instructions (Signed)
Medication Instructions:  Your physician recommends that you continue on your current medications as directed. Please refer to the Current Medication list given to you today.   Labwork: Your physician recommends that you return for lab work today: BMP, lipid panel, hepatic function panel.   Testing/Procedures: None  Follow-Up: Your physician wants you to follow-up in: 6 months. You will receive a reminder letter in the mail two months in advance. If you don't receive a letter, please call our office to schedule the follow-up appointment.   If you need a refill on your cardiac medications before your next appointment, please call your pharmacy.   Thank you for choosing [facility]! Robyne Peers, RN [phone]

## 2018-05-21 NOTE — Progress Notes (Signed)
Cardiology Office Note:    Date:  05/21/2018   ID:  Johnathan Murray, DOB 1951/07/12, MRN [id number]  PCP:  Nicoletta Dress, MD  Cardiologist:  Jenean Lindau, MD   Referring MD: Nicoletta Dress, MD    ASSESSMENT:    1. Coronary artery disease involving native coronary artery of native heart without angina pectoris   2. Essential hypertension   3. PAF (paroxysmal atrial fibrillation) ([place])   4. Diabetes mellitus due to underlying condition with complication, without long-term current use of insulin ([place])   5. Dyslipidemia    PLAN:    In order of problems listed above:  1. Secondary prevention stressed with the patient.  Importance of compliance with diet and medication stressed and he vocalized understanding.  His blood pressure is stable. 2. He has had history approximately fibrillation and he is not keen on anticoagulation.  He is on dual antiplatelet therapy.  I explained benefits and potential risks and he vocalized understanding.  I also have him on an event monitor to understand his rhythm. 3. He will have blood work today including fasting lipids.  I will also assess his renal function. 4. 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  Dr. Angelena Form also agrees that it is fine to send him back to driving from a cardiovascular standpoint. 5. Patient will be seen in follow-up appointment in 6 months or earlier if the patient has any concerns    Medication Adjustments/Labs and Tests Ordered: Current medicines are reviewed at length with the patient today.  Concerns regarding medicines are outlined above.  No orders of the defined types were placed in this encounter.  No orders of the defined types were placed in this encounter.    Chief Complaint  Patient presents with  . Follow-up     History of Present Illness:    Johnathan Murray is a 67 y.o. male.   The patient was evaluated for a physical examination for driving.  Subsequently his stress test was abnormal and his coronary angiography was largely unremarkable.  Today I talked to my interventional colleague who performed the coronary angiography.  Patient denies any problems at this time and takes care of activities of daily living.  No chest pain orthopnea or PND.  He is very active gentleman and walks on a regular basis.  At the time of my evaluation, the patient is alert awake oriented and in no distress.  Past Medical History:  Diagnosis Date  . Abnormal LFTs (liver function tests) 10/31/2016  . Acid reflux 02/15/2018  . Coronary artery disease involving native coronary artery of native heart without angina pectoris 05/30/2016  . Diabetes mellitus due to underlying condition with unspecified complications ([place]) 3/82/5053  . Dyslipidemia 05/30/2016  . Essential hypertension 05/30/2016  . Hyperactive gag reflex 02/15/2018  . Macular degeneration 02/15/2018  . PAF (paroxysmal atrial fibrillation) ([place]) 05/30/2016    Past Surgical History:  Procedure Laterality Date  . AORTIC VALVE REPLACEMENT    . CARDIAC CATHETERIZATION     several stents  . CARDIAC SURGERY    . CORONARY ARTERY BYPASS GRAFT     patient unsure how many, had at [facility]  . HAND SURGERY    . KNEE SURGERY    . LEFT HEART CATH AND CORS/GRAFTS ANGIOGRAPHY N/A 05/16/2018   Procedure: LEFT HEART CATH AND CORS/GRAFTS ANGIOGRAPHY;  Surgeon: Angelena Form,  Annita Brod, MD;  Location: [place] [facility];  Service: Cardiovascular;  Laterality: N/A;    Current Medications: Current Meds  Medication Sig  . amLODipine (NORVASC) 10 MG tablet Take 1 tablet by mouth daily.  . Ascorbic Acid (VITAMIN C) 1000 MG tablet Take 500-1,000 mg by mouth daily.   Marland Kitchen aspirin EC 81 MG tablet Take 81 mg by mouth daily.   . benazepril (LOTENSIN) 40 MG tablet TAKE 1 TABLET BY MOUTH EVERYDAY AT BEDTIME  . Cyanocobalamin (B-12) 5000 MCG CAPS  Take 5,000 mcg by mouth daily.   Marland Kitchen ezetimibe (ZETIA) 10 MG tablet Take 1 tablet (10 mg total) by mouth daily.  . furosemide (LASIX) 40 MG tablet Take 40 mg by mouth daily.  Marland Kitchen levothyroxine (SYNTHROID, LEVOTHROID) 50 MCG tablet Take 50 mcg by mouth daily before breakfast.   . meloxicam (MOBIC) 7.5 MG tablet TAKE 1 (ONE) TABLET TWO TIMES DAILY WITH FOOD  . metFORMIN (GLUCOPHAGE) 1000 MG tablet Take 1,000 mg by mouth 2 (two) times daily.  . nitroGLYCERIN (NITROSTAT) 0.4 MG SL tablet PLACE 1 TABLET UNDER THE TONGUE EVERY FIVE MINUTES AS NEEDED FOR CHEST PAIN.  . ranolazine (RANEXA) 500 MG 12 hr tablet Take 500 mg twice daily for two weeks then change to 1,000 mg twice daily  . sildenafil (VIAGRA) 100 MG tablet 50 mg as needed.  . ticagrelor (BRILINTA) 90 MG TABS tablet Take 90 mg by mouth 2 (two) times daily.      Allergies:   Atorvastatin; Metoprolol; and Nebivolol   Social History   Socioeconomic History  . Marital status: Married    Spouse name: Not on file  . Number of children: Not on file  . Years of education: Not on file  . Highest education level: Not on file  Occupational History  . Not on file  Social Needs  . Financial resource strain: Not on file  . Food insecurity:    Worry: Not on file    Inability: Not on file  . Transportation needs:    Medical: Not on file    Non-medical: Not on file  Tobacco Use  . Smoking status: Never Smoker  . Smokeless tobacco: Never Used  Substance and Sexual Activity  . Alcohol use: Yes    Alcohol/week: 1.2 - 1.8 oz    Types: 2 - 3 Cans of beer per week  . Drug use: No  . Sexual activity: Not on file  Lifestyle  . Physical activity:    Days per week: Not on file    Minutes per session: Not on file  . Stress: Not on file  Relationships  . Social connections:    Talks on phone: Not on file    Gets together: Not on file    Attends religious service: Not on file    Active member of club or organization: Not on file    Attends  meetings of clubs or organizations: Not on file    Relationship status: Not on file  Other Topics Concern  . Not on file  Social History Narrative  . Not on file     Family History: The patient's family history includes Heart disease in his maternal grandmother; Prostate cancer in his brother.  ROS:   Please see the history of present illness.    All other systems reviewed and are negative.  EKGs/Labs/Other Studies Reviewed:    The following studies were reviewed today: I discussed findings of the coronary angiography with the patient at extensive  length.   Recent Labs: 05/03/2018: BUN 26; Creatinine, Ser 1.40; Hemoglobin 12.9; Platelets 217; Potassium 4.9; Sodium 141  Recent Lipid Panel No results found for: CHOL, TRIG, HDL, CHOLHDL, VLDL, LDLCALC, LDLDIRECT  Physical Exam:    VS:  BP 132/68   Pulse 71   Ht 5' 9.5" (1.765 m)   Wt 180 lb (81.6 kg)   SpO2 98%   BMI 26.20 kg/m     Wt Readings from Last 3 Encounters:  05/21/18 180 lb (81.6 kg)  05/16/18 172 lb (78 kg)  05/03/18 176 lb 12.8 oz (80.2 kg)     GEN: Patient is in no acute distress HEENT: Normal NECK: No JVD; No carotid bruits LYMPHATICS: No lymphadenopathy CARDIAC: Hear sounds regular, 2/6 systolic murmur at the apex. RESPIRATORY:  Clear to auscultation without rales, wheezing or rhonchi  ABDOMEN: Soft, non-tender, non-distended MUSCULOSKELETAL:  No edema; No deformity  SKIN: Warm and dry NEUROLOGIC:  Alert and oriented x 3 PSYCHIATRIC:  Normal affect   Signed, Jenean Lindau, MD  05/21/2018 11:22 AM    [facility]

## 2018-05-22 ENCOUNTER — Telehealth: Payer: Self-pay | Admitting: *Deleted

## 2018-05-22 DIAGNOSIS — I10 Essential (primary) hypertension: Secondary | ICD-10-CM

## 2018-05-22 LAB — BASIC METABOLIC PANEL
BUN/Creatinine Ratio: 16 (ref 10–24)
BUN: 24 mg/dL (ref 8–27)
CO2: 21 mmol/L (ref 20–29)
Calcium: 10.2 mg/dL (ref 8.6–10.2)
Chloride: 95 mmol/L — ABNORMAL LOW (ref 96–106)
Creatinine, Ser: 1.53 mg/dL — ABNORMAL HIGH (ref 0.76–1.27)
GFR calc Af Amer: 54 mL/min/{1.73_m2} — ABNORMAL LOW (ref >59)
GFR calc non Af Amer: 47 mL/min/{1.73_m2} — ABNORMAL LOW (ref >59)
Glucose: 109 mg/dL — ABNORMAL HIGH (ref 65–99)
Potassium: 5.5 mmol/L — ABNORMAL HIGH (ref 3.5–5.2)
Sodium: 132 mmol/L — ABNORMAL LOW (ref 134–144)

## 2018-05-22 LAB — HEPATIC FUNCTION PANEL
ALT: 13 IU/L (ref 0–44)
AST: 16 IU/L (ref 0–40)
Albumin: 5.2 g/dL — ABNORMAL HIGH (ref 3.6–4.8)
Alkaline Phosphatase: 59 IU/L (ref 39–117)
Bilirubin Total: 1.5 mg/dL — ABNORMAL HIGH (ref 0.0–1.2)
Bilirubin, Direct: 0.3 mg/dL (ref 0.00–0.40)
Total Protein: 7.2 g/dL (ref 6.0–8.5)

## 2018-05-22 LAB — LIPID PANEL
Chol/HDL Ratio: 2.2 ratio (ref 0.0–5.0)
Cholesterol, Total: 217 mg/dL — ABNORMAL HIGH (ref 100–199)
HDL: 100 mg/dL (ref >39)
LDL Calculated: 105 mg/dL — ABNORMAL HIGH (ref 0–99)
Triglycerides: 61 mg/dL (ref 0–149)
VLDL Cholesterol Cal: 12 mg/dL (ref 5–40)

## 2018-05-22 MED ORDER — SODIUM POLYSTYRENE SULFONATE 15 GM/60ML PO SUSP: 15.0000 g | Freq: Once | ORAL | 0 refills | Status: AC

## 2018-05-22 MED ORDER — EZETIMIBE 10 MG PO TABS: 10.0000 mg | ORAL_TABLET | Freq: Every day | ORAL | 3 refills | Status: DC

## 2018-05-22 NOTE — Telephone Encounter (Signed)
-----   Message from Jenean Lindau, MD sent at 05/22/2018  8:45 AM EDT ----- Please talk to the patient and I would like to know why he is not on statin therapy.  Please also let the patient know to stop furosemide for a few days.  He needs to keep his potassium low in his diet.  Kayexalate 15 g daily.  Chem-7 in 1 week.  Also pulse blood pressure check in 1 week. Jenean Lindau, MD 05/22/2018 8:44 AM

## 2018-05-22 NOTE — Telephone Encounter (Signed)
Patient informed of lab results. Patient advised to stop furosemide until nurse visit on 05/29/18 at 4 pm and to limit potassium rich foods from his diet. Patient will have labs drawn, pulse, and blood pressure checked at this upcoming appointment. Advised patient to take a 1 time dose of kayexalate 15 grams per verbal order from Dr. Geraldo Pitter. Prescription sent along with refills for zetia as patient [facility] [place]. Patient verbalized understanding. No further questions.   Patient confirms he is taking zetia 10 mg daily. Patient states he is unable to tolerate any statin medications as he has "tried three different ones." Dr. Geraldo Pitter notified and advised to discuss pcsk9 injections for lowering cholesterol. Will discuss during nurse visit.

## 2018-05-29 ENCOUNTER — Ambulatory Visit: Payer: BLUE CROSS/BLUE SHIELD

## 2018-06-01 ENCOUNTER — Ambulatory Visit: Payer: BLUE CROSS/BLUE SHIELD | Admitting: Cardiology

## 2018-06-01 ENCOUNTER — Encounter: Payer: Self-pay | Admitting: *Deleted

## 2018-06-05 ENCOUNTER — Ambulatory Visit (INDEPENDENT_AMBULATORY_CARE_PROVIDER_SITE_OTHER): Payer: BLUE CROSS/BLUE SHIELD | Admitting: Cardiology

## 2018-06-05 ENCOUNTER — Other Ambulatory Visit: Payer: Self-pay

## 2018-06-05 VITALS — BP 132/64 | HR 74

## 2018-06-05 DIAGNOSIS — I10 Essential (primary) hypertension: Secondary | ICD-10-CM | POA: Diagnosis not present

## 2018-06-05 NOTE — Progress Notes (Signed)
Patient in today for Blood pressure and pulse check due to discontinuing his Furosemide. He is also her to have labs drawn and discuss PCSK9. His blood pressure and pulse were reviewed with Dr. Agustin Cree. Patient was advised that once his lab returned and reviewed we would advise of any new medications changes.   I discussed PCSK9 with the patient. He is currently only taking Zetia, and has a statin intolerance. We discussed that the office would do a referral to the [facility] and they would evaluate what medications may be suitable for him to help with his cholesterol including a PCSK9 and would get him set up on it. He let me know he would think about it and contact the office if he was interested.

## 2018-06-06 LAB — BASIC METABOLIC PANEL
BUN/Creatinine Ratio: 12 (ref 10–24)
BUN: 21 mg/dL (ref 8–27)
CO2: 23 mmol/L (ref 20–29)
Calcium: 10.1 mg/dL (ref 8.6–10.2)
Chloride: 102 mmol/L (ref 96–106)
Creatinine, Ser: 1.76 mg/dL — ABNORMAL HIGH (ref 0.76–1.27)
GFR calc Af Amer: 46 mL/min/{1.73_m2} — ABNORMAL LOW (ref >59)
GFR calc non Af Amer: 39 mL/min/{1.73_m2} — ABNORMAL LOW (ref >59)
Glucose: 104 mg/dL — ABNORMAL HIGH (ref 65–99)
Potassium: 4.4 mmol/L (ref 3.5–5.2)
Sodium: 139 mmol/L (ref 134–144)

## 2018-06-07 ENCOUNTER — Telehealth: Payer: Self-pay

## 2018-06-07 NOTE — Telephone Encounter (Signed)
Left a detailed voice message on phone regarding results.

## 2018-06-07 NOTE — Telephone Encounter (Signed)
-----   Message from Aleatha Borer, [place] sent at 06/05/2018  4:28 PM EDT ----- Regarding: Records for DOT This patient wanted to make sure we have sent everything on our end, to [facility] in [place], or to Dr. Delena Bali for his DOT Physical. Can you find out for him. I think he may need a letter signed by [facility] stating he is clear.  Thanks

## 2018-06-07 NOTE — Telephone Encounter (Signed)
Cardiac work up with Dr. Geraldo Pitter was faxed to Dr. Park Breed office.

## 2018-07-11 DIAGNOSIS — M1712 Unilateral primary osteoarthritis, left knee: Secondary | ICD-10-CM | POA: Diagnosis not present

## 2018-07-28 DIAGNOSIS — E785 Hyperlipidemia, unspecified: Secondary | ICD-10-CM | POA: Diagnosis not present

## 2018-07-28 DIAGNOSIS — I10 Essential (primary) hypertension: Secondary | ICD-10-CM | POA: Diagnosis not present

## 2018-07-28 DIAGNOSIS — E291 Testicular hypofunction: Secondary | ICD-10-CM | POA: Diagnosis not present

## 2018-07-28 DIAGNOSIS — I4891 Unspecified atrial fibrillation: Secondary | ICD-10-CM | POA: Diagnosis not present

## 2018-07-28 DIAGNOSIS — E1129 Type 2 diabetes mellitus with other diabetic kidney complication: Secondary | ICD-10-CM | POA: Diagnosis not present

## 2018-08-28 ENCOUNTER — Other Ambulatory Visit: Payer: Self-pay

## 2018-08-28 MED ORDER — RANOLAZINE ER 1000 MG PO TB12: ORAL_TABLET | ORAL | 1 refills | Status: DC

## 2018-08-29 DIAGNOSIS — R233 Spontaneous ecchymoses: Secondary | ICD-10-CM | POA: Diagnosis not present

## 2018-08-29 DIAGNOSIS — L989 Disorder of the skin and subcutaneous tissue, unspecified: Secondary | ICD-10-CM | POA: Diagnosis not present

## 2018-08-29 DIAGNOSIS — C44619 Basal cell carcinoma of skin of left upper limb, including shoulder: Secondary | ICD-10-CM | POA: Diagnosis not present

## 2018-08-29 DIAGNOSIS — L570 Actinic keratosis: Secondary | ICD-10-CM | POA: Diagnosis not present

## 2018-08-31 DIAGNOSIS — H35351 Cystoid macular degeneration, right eye: Secondary | ICD-10-CM | POA: Diagnosis not present

## 2018-09-20 ENCOUNTER — Other Ambulatory Visit: Payer: Self-pay

## 2018-09-20 MED ORDER — RANOLAZINE ER 1000 MG PO TB12: 1000.0000 mg | ORAL_TABLET | Freq: Two times a day (BID) | ORAL | 0 refills | Status: DC

## 2018-10-08 DIAGNOSIS — C44629 Squamous cell carcinoma of skin of left upper limb, including shoulder: Secondary | ICD-10-CM | POA: Diagnosis not present

## 2018-10-27 DIAGNOSIS — D509 Iron deficiency anemia, unspecified: Secondary | ICD-10-CM | POA: Diagnosis not present

## 2018-10-27 DIAGNOSIS — E1129 Type 2 diabetes mellitus with other diabetic kidney complication: Secondary | ICD-10-CM | POA: Diagnosis not present

## 2018-10-27 DIAGNOSIS — E785 Hyperlipidemia, unspecified: Secondary | ICD-10-CM | POA: Diagnosis not present

## 2018-10-27 DIAGNOSIS — I10 Essential (primary) hypertension: Secondary | ICD-10-CM | POA: Diagnosis not present

## 2018-10-27 DIAGNOSIS — E291 Testicular hypofunction: Secondary | ICD-10-CM | POA: Diagnosis not present

## 2018-10-27 DIAGNOSIS — I4891 Unspecified atrial fibrillation: Secondary | ICD-10-CM | POA: Diagnosis not present

## 2018-10-27 DIAGNOSIS — E039 Hypothyroidism, unspecified: Secondary | ICD-10-CM | POA: Diagnosis not present

## 2018-10-27 DIAGNOSIS — Z23 Encounter for immunization: Secondary | ICD-10-CM | POA: Diagnosis not present

## 2018-10-30 DIAGNOSIS — L570 Actinic keratosis: Secondary | ICD-10-CM | POA: Diagnosis not present

## 2018-10-31 ENCOUNTER — Ambulatory Visit (INDEPENDENT_AMBULATORY_CARE_PROVIDER_SITE_OTHER): Payer: BLUE CROSS/BLUE SHIELD | Admitting: Cardiology

## 2018-10-31 ENCOUNTER — Encounter: Payer: Self-pay | Admitting: Cardiology

## 2018-10-31 ENCOUNTER — Telehealth: Payer: Self-pay

## 2018-10-31 VITALS — BP 130/74 | HR 65 | Ht 69.5 in | Wt 175.0 lb

## 2018-10-31 DIAGNOSIS — I350 Nonrheumatic aortic (valve) stenosis: Secondary | ICD-10-CM | POA: Diagnosis not present

## 2018-10-31 DIAGNOSIS — I10 Essential (primary) hypertension: Secondary | ICD-10-CM | POA: Diagnosis not present

## 2018-10-31 DIAGNOSIS — I480 Paroxysmal atrial fibrillation: Secondary | ICD-10-CM

## 2018-10-31 DIAGNOSIS — R9439 Abnormal result of other cardiovascular function study: Secondary | ICD-10-CM

## 2018-10-31 DIAGNOSIS — I739 Peripheral vascular disease, unspecified: Secondary | ICD-10-CM

## 2018-10-31 DIAGNOSIS — I2510 Atherosclerotic heart disease of native coronary artery without angina pectoris: Secondary | ICD-10-CM | POA: Diagnosis not present

## 2018-10-31 DIAGNOSIS — E785 Hyperlipidemia, unspecified: Secondary | ICD-10-CM

## 2018-10-31 HISTORY — DX: Peripheral vascular disease, unspecified: I73.9

## 2018-10-31 NOTE — Telephone Encounter (Signed)
Left voicemail for patient to call the office regarding praluent.

## 2018-10-31 NOTE — Progress Notes (Addendum)
Cardiology Office Note:    Date:  10/31/2018   ID:  Johnathan Murray, DOB 1951/07/14, MRN [id number]  PCP:  Johnathan Dress, MD  Cardiologist:  Johnathan Lindau, MD   Referring MD: Johnathan Dress, MD    ASSESSMENT:    1. Coronary artery disease involving native coronary artery of native heart without angina pectoris   2. Essential hypertension   3. PAF (paroxysmal atrial fibrillation) ([place])   4. Aortic valve stenosis, etiology of cardiac valve disease unspecified   5. Dyslipidemia   6. Abnormal nuclear stress test   7. Intermittent claudication ([facility])    PLAN:    In order of problems listed above:  1. Secondary prevention was stressed with patient.  Compliance with diet and medication stressed and he vocalized understanding.  His blood pressure is stable.  Diet was discussed for dyslipidemia. 2. I will try to obtain lab work from primary care physician.  He is intolerant to statins and is taking ezetimibe.  I spoke to him about injectable lipid-lowering medications and he is not keen on it at this time I will review the lab work and get in touch with him. 3. His symptoms suggest intermittent claudication so we will get segmental pressures at rest and exertion.  His peripheral pulses are diminished. 4. Patient will be seen in follow-up appointment in 6 months or earlier if the patient has any concerns    Medication Adjustments/Labs and Tests Ordered: Current medicines are reviewed at length with the patient today.  Concerns regarding medicines are outlined above.  Orders Placed This Encounter  Procedures  . EKG 12-Lead   No orders of the defined types were placed in this encounter.    Chief Complaint  Patient presents with  . Follow-up     History of Present Illness:    Johnathan Murray is a 67 y.o. male.  Patient has known coronary artery disease and prosthetic aortic valve.  He denies any problems From a cardiac standpoint.  Patient mentions to me that  he saw his primary care physician and got blood work including lipids and he was told it was fine.  Patient gives history of leg pain on exertion.  This is getting worse over the past couple of months.  No orthopnea or PND.  At the time of my evaluation, the patient is alert awake oriented and in no distress.  Past Medical History:  Diagnosis Date  . Abnormal LFTs (liver function tests) 10/31/2016  . Acid reflux 02/15/2018  . Coronary artery disease involving native coronary artery of native heart without angina pectoris 05/30/2016  . Diabetes mellitus due to underlying condition with unspecified complications ([place]) 05/16/9484  . Dyslipidemia 05/30/2016  . Essential hypertension 05/30/2016  . Hyperactive gag reflex 02/15/2018  . Macular degeneration 02/15/2018  . PAF (paroxysmal atrial fibrillation) ([place]) 05/30/2016    Past Surgical History:  Procedure Laterality Date  . AORTIC VALVE REPLACEMENT    . CARDIAC CATHETERIZATION     several stents  . CARDIAC SURGERY    . CORONARY ARTERY BYPASS GRAFT     patient unsure how many, had at [facility]  . HAND SURGERY    . KNEE SURGERY    . LEFT HEART CATH AND CORS/GRAFTS ANGIOGRAPHY N/A 05/16/2018   Procedure: LEFT HEART CATH AND CORS/GRAFTS ANGIOGRAPHY;  Surgeon: Burnell Blanks, MD;  Location: [place] [facility];  Service: Cardiovascular;  Laterality: N/A;    Current Medications: Current Meds  Medication Sig  .  amLODipine (NORVASC) 10 MG tablet Take 1 tablet by mouth daily.  . Ascorbic Acid (VITAMIN C) 1000 MG tablet Take 500-1,000 mg by mouth daily.   Marland Kitchen aspirin EC 81 MG tablet Take 81 mg by mouth daily.   . benazepril (LOTENSIN) 40 MG tablet TAKE 1 TABLET BY MOUTH EVERYDAY AT BEDTIME  . Cyanocobalamin (B-12) 5000 MCG CAPS Take 5,000 mcg by mouth daily.   Marland Kitchen ezetimibe (ZETIA) 10 MG tablet Take 1 tablet (10 mg total) by mouth daily.  . furosemide (LASIX) 40 MG tablet Take 40 mg by mouth daily.  Marland Kitchen levothyroxine (SYNTHROID,  LEVOTHROID) 50 MCG tablet Take 50 mcg by mouth daily before breakfast.   . meloxicam (MOBIC) 7.5 MG tablet TAKE 1 (ONE) TABLET TWO TIMES DAILY WITH FOOD  . metFORMIN (GLUCOPHAGE) 1000 MG tablet Take 500 mg by mouth 2 (two) times daily.   . nitroGLYCERIN (NITROSTAT) 0.4 MG SL tablet PLACE 1 TABLET UNDER THE TONGUE EVERY FIVE MINUTES AS NEEDED FOR CHEST PAIN.  . ranolazine (RANEXA) 1000 MG SR tablet Take 1 tablet (1,000 mg total) by mouth 2 (two) times daily. Take 500 mg twice daily for two weeks then change to 1,000 mg twice daily  . sildenafil (VIAGRA) 100 MG tablet 50 mg as needed.     Allergies:   Atorvastatin; Metoprolol; and Nebivolol   Social History   Socioeconomic History  . Marital status: Married    Spouse name: Not on file  . Number of children: Not on file  . Years of education: Not on file  . Highest education level: Not on file  Occupational History  . Not on file  Social Needs  . Financial resource strain: Not on file  . Food insecurity:    Worry: Not on file    Inability: Not on file  . Transportation needs:    Medical: Not on file    Non-medical: Not on file  Tobacco Use  . Smoking status: Never Smoker  . Smokeless tobacco: Never Used  Substance and Sexual Activity  . Alcohol use: Yes    Alcohol/week: 2.0 - 3.0 standard drinks    Types: 2 - 3 Cans of beer per week  . Drug use: No  . Sexual activity: Not on file  Lifestyle  . Physical activity:    Days per week: Not on file    Minutes per session: Not on file  . Stress: Not on file  Relationships  . Social connections:    Talks on phone: Not on file    Gets together: Not on file    Attends religious service: Not on file    Active member of club or organization: Not on file    Attends meetings of clubs or organizations: Not on file    Relationship status: Not on file  Other Topics Concern  . Not on file  Social History Narrative  . Not on file     Family History: The patient's family history  includes Heart disease in his maternal grandmother; Prostate cancer in his brother.  ROS:   Please see the history of present illness.    All other systems reviewed and are negative.  EKGs/Labs/Other Studies Reviewed:    The following studies were reviewed today: EKG reveals sinus rhythm and nonspecific ST-T changes   Recent Labs: 05/03/2018: Hemoglobin 12.9; Platelets 217 05/21/2018: ALT 13 06/05/2018: BUN 21; Creatinine, Ser 1.76; Potassium 4.4; Sodium 139  Recent Lipid Panel    Component Value Date/Time   CHOL 217 (  H) 05/21/2018 1130   TRIG 61 05/21/2018 1130   HDL 100 05/21/2018 1130   CHOLHDL 2.2 05/21/2018 1130   LDLCALC 105 (H) 05/21/2018 1130    Physical Exam:    VS:  BP 130/74 (BP Location: Left Arm, Patient Position: Sitting, Cuff Size: Normal)   Pulse 65   Ht 5' 9.5" (1.765 m)   Wt 175 lb (79.4 kg)   SpO2 99%   BMI 25.47 kg/m     Wt Readings from Last 3 Encounters:  10/31/18 175 lb (79.4 kg)  05/21/18 180 lb (81.6 kg)  05/16/18 172 lb (78 kg)     GEN: Patient is in no acute distress HEENT: Normal NECK: No JVD; No carotid bruits LYMPHATICS: No lymphadenopathy CARDIAC: Hear sounds regular, 2/6 systolic murmur at the apex. RESPIRATORY:  Clear to auscultation without rales, wheezing or rhonchi  ABDOMEN: Soft, non-tender, non-distended MUSCULOSKELETAL:  No edema; No deformity  SKIN: Warm and dry NEUROLOGIC:  Alert and oriented x 3 PSYCHIATRIC:  Normal affect   Signed, Johnathan Lindau, MD  10/31/2018 8:50 AM    [place]

## 2018-10-31 NOTE — Telephone Encounter (Signed)
Informed patient of Dr. Julien Nordmann wishes to start PCSK9 therapy. Patient wishes to hold off on this until after his ABI test. Will decide after.

## 2018-10-31 NOTE — Addendum Note (Signed)
Addended by: Mattie Marlin on: 10/31/2018 08:51 AM   Modules accepted: Orders

## 2018-10-31 NOTE — Patient Instructions (Signed)
Medication Instructions:  Your physician recommends that you continue on your current medications as directed. Please refer to the Current Medication list given to you today.  If you need a refill on your cardiac medications before your next appointment, please call your pharmacy.   Lab work: We will be requesting labs from your PCP.  If you have labs (blood work) drawn today and your tests are completely normal, you will receive your results only by: Marland Kitchen MyChart Message (if you have MyChart) OR . A paper copy in the mail If you have any lab test that is abnormal or we need to change your treatment, we will call you to review the results.  Testing/Procedures: Your physician has requested that you have an ankle brachial index (ABI). During this test an ultrasound and blood pressure cuff are used to evaluate the arteries that supply the arms and legs with blood. Allow thirty minutes for this exam. There are no restrictions or special instructions.  You will be called with an appointment date and time for this testing.   Follow-Up: At [facility], you and your health needs are our priority.  As part of our continuing mission to provide you with exceptional heart care, we have created designated Provider Care Teams.  These Care Teams include your primary Cardiologist (physician) and Advanced Practice Providers (APPs -  Physician Assistants and Nurse Practitioners) who all work together to provide you with the care you need, when you need it.  You will need a follow up appointment in 6 months.  Please call our office 2 months in advance to schedule this appointment.  You may see another member of our [employer] Provider Team in [place]: Jenne Campus, MD . Shirlee More, MD  Any Other Special Instructions Will Be Listed Below (If Applicable).

## 2018-11-07 DIAGNOSIS — R200 Anesthesia of skin: Secondary | ICD-10-CM | POA: Diagnosis not present

## 2018-11-07 DIAGNOSIS — I739 Peripheral vascular disease, unspecified: Secondary | ICD-10-CM | POA: Diagnosis not present

## 2018-11-07 DIAGNOSIS — I10 Essential (primary) hypertension: Secondary | ICD-10-CM | POA: Diagnosis not present

## 2018-11-07 DIAGNOSIS — E119 Type 2 diabetes mellitus without complications: Secondary | ICD-10-CM | POA: Diagnosis not present

## 2018-11-07 DIAGNOSIS — E785 Hyperlipidemia, unspecified: Secondary | ICD-10-CM | POA: Diagnosis not present

## 2018-11-20 DIAGNOSIS — M1712 Unilateral primary osteoarthritis, left knee: Secondary | ICD-10-CM | POA: Diagnosis not present

## 2019-01-26 DIAGNOSIS — E291 Testicular hypofunction: Secondary | ICD-10-CM | POA: Diagnosis not present

## 2019-01-26 DIAGNOSIS — I10 Essential (primary) hypertension: Secondary | ICD-10-CM | POA: Diagnosis not present

## 2019-01-26 DIAGNOSIS — D509 Iron deficiency anemia, unspecified: Secondary | ICD-10-CM | POA: Diagnosis not present

## 2019-01-26 DIAGNOSIS — I4891 Unspecified atrial fibrillation: Secondary | ICD-10-CM | POA: Diagnosis not present

## 2019-01-26 DIAGNOSIS — E785 Hyperlipidemia, unspecified: Secondary | ICD-10-CM | POA: Diagnosis not present

## 2019-01-26 DIAGNOSIS — E1129 Type 2 diabetes mellitus with other diabetic kidney complication: Secondary | ICD-10-CM | POA: Diagnosis not present

## 2019-02-05 NOTE — Progress Notes (Signed)
[facility] Clinic Note - Initial Visit   Date: 02/06/19  Johnathan Murray MRN: [id number] DOB: [birth year]   Dear Dr. Delena Bali:  Thank you for your kind referral of Johnathan Murray for consultation of bilateral foot numbness. Although his history is well known to you, please allow [place] to reiterate it for the purpose of our medical record. The patient was accompanied to the clinic by self.    History of Present Illness: Johnathan Murray is a 68 y.o. right-handed Caucasian male with CAD, PAF, diabetes mellitus, hypertension, BPH, and GERD presenting for evaluation of bilateral foot numbness.  Starting around early 2019, he began experiencing numbness of the right foot, which is triggered by exertion.  He states that he needs to take breaks when walking, to try to make his leg wake up again. Sometimes, he can loose complete sensation of the foot.  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   He has started to notice numbness into the left foot also.  He has low back pain and leg cramps.  Due to his cardiac history, he underwent ABI in December which was normal.  He is referred for neurological evaluation of these symptoms.   Past Medical History:  Diagnosis Date  . Abnormal LFTs (liver function tests) 10/31/2016  . Acid reflux 02/15/2018  . Coronary artery disease involving native coronary artery of native heart without angina pectoris 05/30/2016  . Diabetes mellitus due to underlying condition with unspecified complications ([place]) 4/53/6468  . Dyslipidemia 05/30/2016  . Essential hypertension 05/30/2016  . Hyperactive gag reflex 02/15/2018  . Macular degeneration 02/15/2018  . PAF (paroxysmal atrial fibrillation) ([place]) 05/30/2016    Past Surgical History:  Procedure Laterality Date  . AORTIC VALVE REPLACEMENT    . CARDIAC CATHETERIZATION     several stents  .  CARDIAC SURGERY    . CORONARY ARTERY BYPASS GRAFT     patient unsure how many, had at [facility]  . HAND SURGERY    . KNEE SURGERY    . LEFT HEART CATH AND CORS/GRAFTS ANGIOGRAPHY N/A 05/16/2018   Procedure: LEFT HEART CATH AND CORS/GRAFTS ANGIOGRAPHY;  Surgeon: Burnell Blanks, MD;  Location: [facility];  Service: Cardiovascular;  Laterality: N/A;     Medications:  Outpatient Encounter Medications as of 02/06/2019  Medication Sig  . amLODipine (NORVASC) 10 MG tablet Take 1 tablet by mouth daily.  . Ascorbic Acid (VITAMIN C) 1000 MG tablet Take 500-1,000 mg by mouth daily.   Marland Kitchen aspirin EC 81 MG tablet Take 81 mg by mouth daily.   . benazepril (LOTENSIN) 40 MG tablet TAKE 1 TABLET BY MOUTH EVERYDAY AT BEDTIME  . Cyanocobalamin (B-12) 5000 MCG CAPS Take 5,000 mcg by mouth daily.   . furosemide (LASIX) 40 MG tablet Take 40 mg by mouth daily.  Marland Kitchen levothyroxine (SYNTHROID, LEVOTHROID) 50 MCG tablet Take 50 mcg by mouth daily before breakfast.   . meloxicam (MOBIC) 7.5 MG tablet TAKE 1 (ONE) TABLET TWO TIMES DAILY WITH FOOD  . metFORMIN (GLUCOPHAGE) 500 MG tablet Take 500 mg by mouth daily.  . nitroGLYCERIN (NITROSTAT) 0.4 MG SL tablet PLACE 1 TABLET UNDER THE TONGUE EVERY FIVE MINUTES AS NEEDED FOR CHEST PAIN.  . sildenafil (VIAGRA) 100 MG tablet 50 mg as needed.  . testosterone cypionate (DEPOTESTOSTERONE CYPIONATE) 200 MG/ML injection INJECT 2 ML INTRAMUSCULARY EVERY 2 WEEKS  . ezetimibe (  ZETIA) 10 MG tablet Take 1 tablet (10 mg total) by mouth daily.  . [DISCONTINUED] metFORMIN (GLUCOPHAGE) 1000 MG tablet Take 500 mg by mouth 2 (two) times daily.   . [DISCONTINUED] ranolazine (RANEXA) 1000 MG SR tablet Take 1 tablet (1,000 mg total) by mouth 2 (two) times daily. Take 500 mg twice daily for two weeks then change to 1,000 mg twice daily   No facility-administered encounter medications on file as of 02/06/2019.     Allergies:  Allergies  Allergen Reactions  .  Atorvastatin Other (See Comments)    Urinary retention   . Metoprolol Diarrhea  . Nebivolol Diarrhea    Family History: Family History  Problem Relation Age of Onset  . Prostate cancer Brother   . Heart disease Maternal Grandmother   . Brain cancer Mother     Social History: Social History   Tobacco Use  . Smoking status: Never Smoker  . Smokeless tobacco: Never Used  Substance Use Topics  . Alcohol use: Yes    Alcohol/week: 2.0 - 3.0 standard drinks    Types: 2 - 3 Cans of beer per week    Comment: 1-2 beers nightly.  . Drug use: No   Social History   Social History Narrative   He drives a truck for [employer].   He lives with wife in a one-level home.   Highest level of education:  GED    Review of Systems:  CONSTITUTIONAL: No fevers, chills, night sweats, or weight loss.   EYES: No visual changes or eye pain ENT: No hearing changes.  No history of nose bleeds.   RESPIRATORY: No cough, wheezing and shortness of breath.   CARDIOVASCULAR: Negative for chest pain, and palpitations.   GI: Negative for abdominal discomfort, blood in stools or black stools.  No recent change in bowel habits.   GU:  No history of incontinence.   MUSCLOSKELETAL: No history of joint pain or swelling.  No myalgias.   SKIN: Negative for lesions, rash, and itching.   HEMATOLOGY/ONCOLOGY: Negative for prolonged bleeding, bruising easily, and swollen nodes.  No history of cancer.   ENDOCRINE: Negative for cold or heat intolerance, polydipsia or goiter.   PSYCH:  No depression or anxiety symptoms.   NEURO: As Above.   Vital Signs:  BP 140/80   Pulse 68   Temp 98.3 F (36.8 C) (Oral)   Ht 5' 9.5" (1.765 m)   Wt 183 lb 8 oz (83.2 kg)   SpO2 98%   BMI 26.71 kg/m    General Medical Exam:   General:  Well appearing, comfortable.   Eyes/ENT: see cranial nerve examination.   Neck:   No carotid bruits. Respiratory:  Clear to auscultation, good air entry bilaterally.   Cardiac:   Regular rate and rhythm, no murmur.   Extremities:  No deformities, edema, or skin discoloration.  Skin:  No rashes or lesions.  Neurological Exam: MENTAL STATUS including orientation to time, place, person, recent and remote memory, attention span and concentration, language, and fund of knowledge is normal.  Speech is not dysarthric.  CRANIAL NERVES: II:  No visual field defects.  Unremarkable fundi.   III-IV-VI: Pupils equal round and reactive to light.  Normal conjugate, extra-ocular eye movements in all directions of gaze.  No nystagmus.  No ptosis.   V:  Normal facial sensation.    VII:  Normal facial symmetry and movements.   VIII:  Normal hearing and vestibular function.   IX-X:  Normal palatal  movement.   XI:  Normal shoulder shrug and head rotation.   XII:  Normal tongue strength and range of motion, no deviation or fasciculation.  MOTOR:  No atrophy, fasciculations or abnormal movements.  No pronator drift.   Upper Extremity:  Right  Left  Deltoid  5/5   5/5   Biceps  5/5   5/5   Triceps  5/5   5/5   Infraspinatus 5/5  5/5  Medial pectoralis 5/5  5/5  Wrist extensors  5/5   5/5   Wrist flexors  5/5   5/5   Finger extensors  5/5   5/5   Finger flexors  5/5   5/5   Dorsal interossei  5/5   5/5   Abductor pollicis  5/5   5/5   Tone (Ashworth scale)  0  0   Lower Extremity:  Right  Left  Hip flexors  5/5   5/5   Hip extensors  5/5   5/5   Adductor 5/5  5/5  Abductor 5/5  5/5  Knee flexors  5/5   5/5   Knee extensors  5/5   5/5   Dorsiflexors  5/5   5/5   Plantarflexors  5/5   5/5   Toe extensors  5/5   5/5   Toe flexors  5/5   5/5   Tone (Ashworth scale)  0  0   MSRs:  Right        Left                  brachioradialis 2+  2+  biceps 2+  2+  triceps 2+  2+  patellar 3+  3+  ankle jerk tr  tr  Hoffman no  no  plantar response down  down   SENSORY:  Vibration is reduced to 50% at the ankles and absent at the great toe.  Temperature is reduced over the  dorsum of the feet.  Sensation in the hands is normal.  Rhomberg testing is positive.  COORDINATION/GAIT: Normal finger-to- nose-finger. Gait narrow based and stable. Tandem and stressed gait intact.    IMPRESSION: Bilateral feet paresthesias.  Discussed possibility of neruopathy vs. Lumbosacral radiculopathy.  He may have an overlap of symptoms.  His diabetes, however, is very well-controlled. Given that symptoms are triggered by exertion, asymetrical, and he has brisk patella reflexes, I will first check for lumbar canal stenosis which could manifest with neurogenic claudication. If imaging is negative, next step his NCS/EMG.    Further recommendations pending results.   Thank you for allowing me to participate in patient's care.  If I can answer any additional questions, I would be pleased to do so.    Sincerely,     K. Posey Pronto, DO

## 2019-02-06 ENCOUNTER — Other Ambulatory Visit: Payer: Self-pay

## 2019-02-06 ENCOUNTER — Encounter: Payer: Self-pay | Admitting: Neurology

## 2019-02-06 ENCOUNTER — Ambulatory Visit: Payer: BLUE CROSS/BLUE SHIELD | Admitting: Neurology

## 2019-02-06 VITALS — BP 140/80 | HR 68 | Temp 98.3°F | Ht 69.5 in | Wt 183.5 lb

## 2019-02-06 DIAGNOSIS — R202 Paresthesia of skin: Secondary | ICD-10-CM

## 2019-02-06 DIAGNOSIS — M48062 Spinal stenosis, lumbar region with neurogenic claudication: Secondary | ICD-10-CM | POA: Diagnosis not present

## 2019-02-06 NOTE — Patient Instructions (Signed)
MRI lumbar spine without contrast.  We will call you with the results and decide the next step. 

## 2019-02-25 ENCOUNTER — Telehealth: Payer: Self-pay | Admitting: Neurology

## 2019-02-25 NOTE — Telephone Encounter (Signed)
Patient state that his MRI was rsch by [facility] for 03-01-19 and we need to get the prior auth for the MRI ato be covered by insurance

## 2019-02-25 NOTE — Telephone Encounter (Signed)
Note sent to Es.

## 2019-02-26 ENCOUNTER — Telehealth: Payer: Self-pay | Admitting: *Deleted

## 2019-02-26 NOTE — Telephone Encounter (Signed)
No PA needed for MRI 

## 2019-02-26 NOTE — Telephone Encounter (Signed)
No PA needed

## 2019-03-01 ENCOUNTER — Other Ambulatory Visit: Payer: Self-pay

## 2019-03-01 ENCOUNTER — Ambulatory Visit
Admission: RE | Admit: 2019-03-01 | Discharge: 2019-03-01 | Disposition: A | Discharge status: Home / Self Care | Payer: BLUE CROSS/BLUE SHIELD | Source: Ambulatory Visit | Attending: Neurology | Admitting: Neurology

## 2019-03-01 DIAGNOSIS — M48061 Spinal stenosis, lumbar region without neurogenic claudication: Secondary | ICD-10-CM | POA: Diagnosis not present

## 2019-03-01 DIAGNOSIS — R202 Paresthesia of skin: Secondary | ICD-10-CM

## 2019-03-01 DIAGNOSIS — M48062 Spinal stenosis, lumbar region with neurogenic claudication: Secondary | ICD-10-CM

## 2019-03-04 ENCOUNTER — Telehealth: Payer: Self-pay | Admitting: Neurology

## 2019-03-04 NOTE — Telephone Encounter (Signed)
I will send referrals.

## 2019-03-04 NOTE — Telephone Encounter (Signed)
Called and informed patient that his MRI lumbar spine shows severe spinal stenosis at L4-5, moderate at L3-4.  Symptoms are consistent with neurogenic claudication.  I will refer patient to [facility] for their surgical opinion.  He will also start PT in the meantime.    Johnathan Murray K. Posey Pronto, DO

## 2019-03-06 ENCOUNTER — Encounter: Payer: Self-pay | Admitting: *Deleted

## 2019-03-06 ENCOUNTER — Other Ambulatory Visit: Payer: Self-pay

## 2019-03-06 ENCOUNTER — Telehealth: Payer: Self-pay | Admitting: *Deleted

## 2019-03-06 ENCOUNTER — Telehealth (INDEPENDENT_AMBULATORY_CARE_PROVIDER_SITE_OTHER): Payer: BLUE CROSS/BLUE SHIELD | Admitting: Neurology

## 2019-03-06 DIAGNOSIS — E1142 Type 2 diabetes mellitus with diabetic polyneuropathy: Secondary | ICD-10-CM

## 2019-03-06 DIAGNOSIS — M48062 Spinal stenosis, lumbar region with neurogenic claudication: Secondary | ICD-10-CM

## 2019-03-06 DIAGNOSIS — R202 Paresthesia of skin: Secondary | ICD-10-CM

## 2019-03-06 MED ORDER — GABAPENTIN 300 MG PO CAPS: ORAL_CAPSULE | ORAL | 3 refills | Status: DC

## 2019-03-06 MED ORDER — CYCLOBENZAPRINE HCL 10 MG PO TABS: 10.0000 mg | ORAL_TABLET | Freq: Every day | ORAL | 3 refills | Status: DC

## 2019-03-06 NOTE — Progress Notes (Signed)
   Virtual Visit via Video Note The purpose of this virtual visit is to provide medical care while limiting exposure to the novel coronavirus.    Consent was obtained for video visit:  Yes.   Answered questions that patient had about telehealth interaction:  Yes.   I discussed the limitations, risks, security and privacy concerns of performing an evaluation and management service by telemedicine. I also discussed with the patient that there may be a patient responsible charge related to this service. The patient expressed understanding and agreed to proceed.  Pt location: Home Physician Location: office Name of referring provider:  Nicoletta Dress, MD I connected with [name] at patients initiation/request on 03/06/2019 at  9:30 AM EDT by video enabled telemedicine application and verified that I am speaking with the correct person using two identifiers. Pt MRN:  [id number] Pt DOB:  [birth year] Video Participants:  [name];  wife   History of Present Illness: This is a 68 y.o. male with CAD, PAF, diabetes mellitus, hypertension, BPH, and GERD returning for follow-up of bilateral thigh and feet paresthesias, and new complaints of hand numbness and tingling.  He continues to have increased leg stiffness, imbalance, and increased numbness in the posterior thighs. He does not have bowel/bladder incontinence. He has been taking flexeril 10mg  at bedtime as needed.  MRI lumbar spine from earlier this week was personally viewed and shows degenerative changes with severe spinal canal stenosis at L4-5 and moderate at L3-4.    About a week after his last visit, he began having numbness/tingling of both hands, this is worse at nighttime and often wakes him up from sleeping.  He does have some right hand weakness.  He denies any radiating pain down his arms.   He is also complaining of increased lightheadedness and unsteadiness, especially when his eyes are closed.  He walks  unassisted has not had any falls.   Observations/Objective:   Patient is awake, alert, and appears comfortable.  Oriented x 4.   Extraocular muscles are intact. No ptosis.  Face is symmetric.  Speech is not dysarthric. Tongue is midline. Antigravity in all extremities.  No pronator drift. Prayer and Phalen's testing is positive at the right wrist.  Gait appears wide-based, unassisted.   Assessment and Plan:  1.  Lumbar spinal canal stenosis with neurogenic claudication, severe at L4-5, causing bilateral thigh numbness, leg stiffness, and cramps - worse  - Referral to [facility] asap  - Start physical therapy  - Start flexeril 10mg  at bedtime  2.  Bilateral hand paresthesias, worse on the right.  Symptoms are most suggestive of carpal tunnel syndrome. - new  - Start using right wrist splint  - Start gabapentin 300mg  at bedtime x 1 week, then increase to 300mg  BID  - NCS/EMG of the arm when able  3.  Diabetic peripheral neuropathy with sensory ataxia and feet numbness - stable  - NCS/EMG of the legs when able   - Fall precautions discussed   Follow Up Instructions:   I discussed the assessment and treatment plan with the patient. The patient was provided an opportunity to ask questions and all were answered. The patient agreed with the plan and demonstrated an understanding of the instructions.   The patient was advised to call back or seek an in-person evaluation if the symptoms worsen or if the condition fails to improve as anticipated.  Follow-up in 4 weeks   Alda Berthold, DO

## 2019-03-06 NOTE — Telephone Encounter (Signed)
Appointment is scheduled for 03/14/2019 at 11:00.

## 2019-03-07 DIAGNOSIS — M4716 Other spondylosis with myelopathy, lumbar region: Secondary | ICD-10-CM | POA: Diagnosis not present

## 2019-03-08 ENCOUNTER — Telehealth: Payer: Self-pay | Admitting: Neurology

## 2019-03-08 NOTE — Telephone Encounter (Signed)
Patient called regarding needing a new order Faxed to [facility] for his Hands and Arms. He said he is having numbness. He had his first appointment at [facility] today for his Back.  Thanks

## 2019-03-08 NOTE — Telephone Encounter (Signed)
Yes, ok to refer.

## 2019-03-08 NOTE — Telephone Encounter (Signed)
Ok to send for OT as well?

## 2019-03-11 ENCOUNTER — Telehealth: Payer: Self-pay | Admitting: Neurology

## 2019-03-11 DIAGNOSIS — M4716 Other spondylosis with myelopathy, lumbar region: Secondary | ICD-10-CM | POA: Diagnosis not present

## 2019-03-11 DIAGNOSIS — R202 Paresthesia of skin: Secondary | ICD-10-CM | POA: Diagnosis not present

## 2019-03-11 NOTE — Telephone Encounter (Signed)
Colletta [place] called regarding needing a note faxed to  [phone] with a Diagnostic code and the note stating treatment for his Hands and Arms. Thanks

## 2019-03-11 NOTE — Telephone Encounter (Signed)
Note and dx code faxed.

## 2019-03-11 NOTE — Telephone Encounter (Signed)
Referral sent 

## 2019-03-13 DIAGNOSIS — M4716 Other spondylosis with myelopathy, lumbar region: Secondary | ICD-10-CM | POA: Diagnosis not present

## 2019-03-13 DIAGNOSIS — R202 Paresthesia of skin: Secondary | ICD-10-CM | POA: Diagnosis not present

## 2019-03-14 ENCOUNTER — Other Ambulatory Visit: Payer: Self-pay | Admitting: Neurosurgery

## 2019-03-14 DIAGNOSIS — M4802 Spinal stenosis, cervical region: Secondary | ICD-10-CM

## 2019-03-14 DIAGNOSIS — M48062 Spinal stenosis, lumbar region with neurogenic claudication: Secondary | ICD-10-CM | POA: Diagnosis not present

## 2019-03-14 DIAGNOSIS — M48061 Spinal stenosis, lumbar region without neurogenic claudication: Secondary | ICD-10-CM

## 2019-03-14 HISTORY — DX: Spinal stenosis, cervical region: M48.02

## 2019-03-14 HISTORY — DX: Spinal stenosis, lumbar region without neurogenic claudication: M48.061

## 2019-03-19 DIAGNOSIS — M4716 Other spondylosis with myelopathy, lumbar region: Secondary | ICD-10-CM | POA: Diagnosis not present

## 2019-03-19 DIAGNOSIS — R202 Paresthesia of skin: Secondary | ICD-10-CM | POA: Diagnosis not present

## 2019-03-25 ENCOUNTER — Telehealth: Payer: Self-pay | Admitting: Neurology

## 2019-03-25 NOTE — Telephone Encounter (Signed)
Patient called regarding his Referral to Neurosurgery and visit with Dr. Trenton Gammon. He said that his back has been put on hold. He He has been set up for Surgery. Doctor Trenton Gammon feels that the Pinched Nerve in his neck is causing the issues. He said that his hands are going numb. He said he is unable to walk also. He would like to talk with you regarding your thoughts. Please Call. Thanks

## 2019-03-25 NOTE — Telephone Encounter (Signed)
Please advise 

## 2019-03-25 NOTE — Telephone Encounter (Signed)
Please request MRI cervical spine from [facility] and set pt up for e-visit. Thanks.

## 2019-03-27 NOTE — Telephone Encounter (Signed)
I spoke with patient and got patient  sch for 03-28-19 for a e visit with Posey Pronto

## 2019-03-28 ENCOUNTER — Encounter: Payer: Self-pay | Admitting: Neurology

## 2019-03-28 ENCOUNTER — Telehealth (INDEPENDENT_AMBULATORY_CARE_PROVIDER_SITE_OTHER): Payer: BLUE CROSS/BLUE SHIELD | Admitting: Neurology

## 2019-03-28 ENCOUNTER — Other Ambulatory Visit: Payer: Self-pay

## 2019-03-28 ENCOUNTER — Other Ambulatory Visit: Payer: Self-pay | Admitting: Neurology

## 2019-03-28 VITALS — Ht 70.0 in | Wt 180.0 lb

## 2019-03-28 DIAGNOSIS — M4802 Spinal stenosis, cervical region: Secondary | ICD-10-CM

## 2019-03-28 DIAGNOSIS — M48062 Spinal stenosis, lumbar region with neurogenic claudication: Secondary | ICD-10-CM | POA: Diagnosis not present

## 2019-03-28 NOTE — Progress Notes (Signed)
   Virtual Visit via Video Note The purpose of this virtual visit is to provide medical care while limiting exposure to the novel coronavirus.    Consent was obtained for video visit:  Yes.   Answered questions that patient had about telehealth interaction:  Yes.   I discussed the limitations, risks, security and privacy concerns of performing an evaluation and management service by telemedicine. I also discussed with the patient that there may be a patient responsible charge related to this service. The patient expressed understanding and agreed to proceed.  Pt location: Home Physician Location: office Name of referring provider:  Nicoletta Dress, MD I connected with Johnathan Murray at patients initiation/request on 03/28/2019 at  1:00 PM EDT by video enabled telemedicine application and verified that I am speaking with the correct person using two identifiers. Pt MRN:  [id number] Pt DOB:  [birth year] Video Participants:  Johnathan Murray   History of Present Illness: This is a 68 y.o. male returning for follow-up of lumbar canal stenosis.  He was evaluated by [name] who noted that he was having worsening paresthesias and weakness in the hands, which was concerning for cervical myelopathy.  Patient reports having MRI of the cervical spine by his orthopeadic surgery in [place] which showed severe cord compression at C4-5 and C5-6 and recommended he undergo neck surgery, but he declined this due to improved pain. the past several week, his hand weakness and pain has become worse.  He is scheduled to have cervical surgery on 6/1.  He is seeking my opinion on whether it is appropriate to undergo neck surgery or pursue lumbar surgery.    Observations/Objective:   Vitals:   03/28/19 1121  Weight: 180 lb (81.6 kg)  Height: 5\' 10"  (1.778 m)   Patient is awake, alert, and appears comfortable.  Oriented x 4.   Extraocular muscles are intact. No ptosis.  Face is symmetric.  Speech is not  dysarthric. Antigravity in all extremities.  Finger tapping is slowed bilaterally.    Assessment and Plan:  1.  Cervical canal stenosis with cord compression, severe at C4-5 2.  Neurogenic claudication due to lumbar canal stenosis, severe at L4-5  I explained to patient that although his gait imbalance was the primary reason for consultation and he has known lumbar canal stenosis at L4-5, which is severe, the more pressing concern is upper extremity weakness and paresthesias from cervical disease, which could also be contributing to leg weakness/instability.  I agree that he should undergo cervical decompression first as planned by [name].  All questions were answered and he was appreciative for the explanation.    Follow Up Instructions:   I discussed the assessment and treatment plan with the patient. The patient was provided an opportunity to ask questions and all were answered. The patient agreed with the plan and demonstrated an understanding of the instructions.   The patient was advised to call back or seek an in-person evaluation if the symptoms worsen or if the condition fails to improve as anticipated.  Total time spent:  25 minutes     Alda Berthold, DO

## 2019-04-05 ENCOUNTER — Ambulatory Visit: Payer: BLUE CROSS/BLUE SHIELD | Admitting: Neurology

## 2019-04-17 ENCOUNTER — Telehealth (INDEPENDENT_AMBULATORY_CARE_PROVIDER_SITE_OTHER): Payer: BLUE CROSS/BLUE SHIELD | Admitting: Cardiology

## 2019-04-17 ENCOUNTER — Telehealth: Payer: Self-pay

## 2019-04-17 ENCOUNTER — Encounter: Payer: Self-pay | Admitting: Cardiology

## 2019-04-17 ENCOUNTER — Telehealth: Payer: Self-pay | Admitting: Cardiology

## 2019-04-17 ENCOUNTER — Other Ambulatory Visit: Payer: Self-pay

## 2019-04-17 VITALS — BP 125/57 | HR 72 | Ht 70.0 in | Wt 185.0 lb

## 2019-04-17 DIAGNOSIS — Z01810 Encounter for preprocedural cardiovascular examination: Secondary | ICD-10-CM

## 2019-04-17 DIAGNOSIS — I10 Essential (primary) hypertension: Secondary | ICD-10-CM

## 2019-04-17 DIAGNOSIS — E785 Hyperlipidemia, unspecified: Secondary | ICD-10-CM

## 2019-04-17 DIAGNOSIS — I480 Paroxysmal atrial fibrillation: Secondary | ICD-10-CM

## 2019-04-17 DIAGNOSIS — E088 Diabetes mellitus due to underlying condition with unspecified complications: Secondary | ICD-10-CM

## 2019-04-17 DIAGNOSIS — I2510 Atherosclerotic heart disease of native coronary artery without angina pectoris: Secondary | ICD-10-CM

## 2019-04-17 HISTORY — DX: Encounter for preprocedural cardiovascular examination: Z01.810

## 2019-04-17 NOTE — Patient Instructions (Signed)
Medication Instructions:  Your physician recommends that you continue on your current medications as directed. Please refer to the Current Medication list given to you today.  If you need a refill on your cardiac medications before your next appointment, please call your pharmacy.   Lab work: NONE If you have labs (blood work) drawn today and your tests are completely normal, you will receive your results only by: . MyChart Message (if you have MyChart) OR . A paper copy in the mail If you have any lab test that is abnormal or we need to change your treatment, we will call you to review the results.  Testing/Procedures: NONE  Follow-Up: At [facility], you and your health needs are our priority.  As part of our continuing mission to provide you with exceptional heart care, we have created designated Provider Care Teams.  These Care Teams include your primary Cardiologist (physician) and Advanced Practice Providers (APPs -  Physician Assistants and Nurse Practitioners) who all work together to provide you with the care you need, when you need it. You will need a follow up appointment in 3 months.   

## 2019-04-17 NOTE — Telephone Encounter (Signed)
Virtual Visit Pre-Appointment Phone Call  "(Name), I am calling you today to discuss your upcoming appointment. We are currently trying to limit exposure to the virus that causes COVID-19 by seeing patients at home rather than in the office."  1. "What is the BEST phone number to call the day of the visit?" - include this in appointment notes  2. Do you have or have access to (through a family member/friend) a smartphone with video capability that we can use for your visit?" a. If yes - list this number in appt notes as cell (if different from BEST phone #) and list the appointment type as a VIDEO visit in appointment notes b. If no - list the appointment type as a PHONE visit in appointment notes  3. Confirm consent - "In the setting of the current Covid19 crisis, you are scheduled for a (phone or video) visit with your provider on (date) at (time).  Just as we do with many in-office visits, in order for you to participate in this visit, we must obtain consent.  If you'd like, I can send this to your mychart (if signed up) or email for you to review.  Otherwise, I can obtain your verbal consent now.  All virtual visits are billed to your insurance company just like a normal visit would be.  By agreeing to a virtual visit, we'd like you to understand that the technology does not allow for your provider to perform an examination, and thus may limit your provider's ability to fully assess your condition. If your provider identifies any concerns that need to be evaluated in person, we will make arrangements to do so.  Finally, though the technology is pretty good, we cannot assure that it will always work on either your or our end, and in the setting of a video visit, we may have to convert it to a phone-only visit.  In either situation, we cannot ensure that we have a secure connection.  Are you willing to proceed?" STAFF: Did the patient verbally acknowledge consent to telehealth visit? Document  YES/NO here: YES  4. Advise patient to be prepared - "Two hours prior to your appointment, go ahead and check your blood pressure, pulse, oxygen saturation, and your weight (if you have the equipment to check those) and write them all down. When your visit starts, your provider will ask you for this information. If you have an Apple Watch or Kardia device, please plan to have heart rate information ready on the day of your appointment. Please have a pen and paper handy nearby the day of the visit as well."  5. Give patient instructions for MyChart download to smartphone OR Doximity/Doxy.me as below if video visit (depending on what platform provider is using)  6. Inform patient they will receive a phone call 15 minutes prior to their appointment time (may be from unknown caller ID) so they should be prepared to answer    TELEPHONE CALL NOTE  Johnathan Murray has been deemed a candidate for a follow-up tele-health visit to limit community exposure during the Covid-19 pandemic. I spoke with the patient via phone to ensure availability of phone/video source, confirm preferred email & phone number, and discuss instructions and expectations.  I reminded Johnathan Murray to be prepared with any vital sign and/or heart rhythm information that could potentially be obtained via home monitoring, at the time of his visit. I reminded Johnathan Murray to expect a phone call prior to  his visit.  Johnathan Murray 04/17/2019 1:41 PM   INSTRUCTIONS FOR DOWNLOADING THE MYCHART APP TO SMARTPHONE  - The patient must first make sure to have activated MyChart and know their login information - If Apple, go to [employer] and type in MyChart in the search bar and download the app. If Android, ask patient to go to [employer] and type in [place] in the search bar and download the app. The app is free but as with any other app downloads, their phone may require them to verify saved payment information or  Apple/Android password.  - The patient will need to then log into the app with their MyChart username and password, and select [facility] as their healthcare provider to link the account. When it is time for your visit, go to the MyChart app, find appointments, and click Begin Video Visit. Be sure to Select Allow for your device to access the Microphone and Camera for your visit. You will then be connected, and your provider will be with you shortly.  **If they have any issues connecting, or need assistance please contact MyChart service desk [phone])**  **If using a computer, in order to ensure the best quality for their visit they will need to use either of the following Internet Browsers: [employer], or Google Chrome**  IF USING DOXIMITY or DOXY.ME - The patient will receive a link just prior to their visit by text.     FULL LENGTH CONSENT FOR TELE-HEALTH VISIT   I hereby voluntarily request, consent and authorize [facility] and its employed or contracted physicians, physician assistants, nurse practitioners or other licensed health care professionals (the Practitioner), to provide me with telemedicine health care services (the Services") as deemed necessary by the treating Practitioner. I acknowledge and consent to receive the Services by the Practitioner via telemedicine. I understand that the telemedicine visit will involve communicating with the Practitioner through live audiovisual communication technology and the disclosure of certain medical information by electronic transmission. I acknowledge that I have been given the opportunity to request an in-person assessment or other available alternative prior to the telemedicine visit and am voluntarily participating in the telemedicine visit.  I understand that I have the right to withhold or withdraw my consent to the use of telemedicine in the course of my care at any time, without affecting my right to future care  or treatment, and that the Practitioner or I may terminate the telemedicine visit at any time. I understand that I have the right to inspect all information obtained and/or recorded in the course of the telemedicine visit and may receive copies of available information for a reasonable fee.  I understand that some of the potential risks of receiving the Services via telemedicine include:   Delay or interruption in medical evaluation due to technological equipment failure or disruption;  Information transmitted may not be sufficient (e.g. poor resolution of images) to allow for appropriate medical decision making by the Practitioner; and/or   In rare instances, security protocols could fail, causing a breach of personal health information.  Furthermore, I acknowledge that it is my responsibility to provide information about my medical history, conditions and care that is complete and accurate to the best of my ability. I acknowledge that Practitioner's advice, recommendations, and/or decision may be based on factors not within their control, such as incomplete or inaccurate data provided by me or distortions of diagnostic images or specimens that may result from electronic transmissions. I  understand that the practice of medicine is not an exact science and that Practitioner makes no warranties or guarantees regarding treatment outcomes. I acknowledge that I will receive a copy of this consent concurrently upon execution via email to the email address I last provided but may also request a printed copy by calling the office of [facility].    I understand that my insurance will be billed for this visit.   I have read or had this consent read to me.  I understand the contents of this consent, which adequately explains the benefits and risks of the Services being provided via telemedicine.   I have been provided ample opportunity to ask questions regarding this consent and the Services and have had  my questions answered to my satisfaction.  I give my informed consent for the services to be provided through the use of telemedicine in my medical care  By participating in this telemedicine visit I agree to the above.

## 2019-04-17 NOTE — Telephone Encounter (Signed)
Patient requesting cardiac clearance for a surgery that is scheduled for 04/22/19 but last office visit was Dec.2019.Left message on home phone for patient to call back to schedule vv with Dr.RRR for today or tomorrow.

## 2019-04-17 NOTE — Pre-Procedure Instructions (Signed)
[id number] - [place], [place] [address] [place] [address] Phone: [phone] Fax: [phone]      Your procedure is scheduled on Monday June 1st.  Report to [facility] Main Entrance "A" at 9:00 A.M., and check in at the Admitting office.  Call this number if you have problems the morning of surgery:  [phone]  Call [phone] if you have any questions prior to your surgery date Monday-Friday 8am-4pm    Take these medicines the morning of surgery with A SIP OF WATER  amLODipine (NORVASC) ezetimibe (ZETIA) gabapentin (NEURONTIN) levothyroxine (SYNTHROID, LEVOTHROID nitroGLYCERIN (NITROSTAT)-if needed cyclobenzaprine (FLEXERIL)-if needed   HOW TO MANAGE YOUR DIABETES BEFORE AND AFTER SURGERY  Why is it important to control my blood sugar before and after surgery? . Improving blood sugar levels before and after surgery helps healing and can limit problems. . A way of improving blood sugar control is eating a healthy diet by: o  Eating less sugar and carbohydrates o  Increasing activity/exercise o  Talking with your doctor about reaching your blood sugar goals . High blood sugars (greater than 180 mg/dL) can raise your risk of infections and slow your recovery, so you will need to focus on controlling your diabetes during the weeks before surgery. . Make sure that the doctor who takes care of your diabetes knows about your planned surgery including the date and location.  How do I manage my blood sugar before surgery? . Check your blood sugar at least 4 times a day, starting 2 days before surgery, to make sure that the level is not too high or low. o Check your blood sugar the morning of your surgery when you wake up and every 2 hours until you get to the Short Stay unit. . If your blood sugar is less than 70 mg/dL, you will need to treat for low blood sugar: o Do not take insulin. o Treat a low blood sugar (less than 70 mg/dL) with  cup  of clear juice (cranberry or apple), 4 glucose tablets, OR glucose gel. Recheck blood sugar in 15 minutes after treatment (to make sure it is greater than 70 mg/dL). If your blood sugar is not greater than 70 mg/dL on recheck, call [phone] o  for further instructions. . Report your blood sugar to the short stay nurse when you get to Short Stay.  . If you are admitted to the hospital after surgery: o Your blood sugar will be checked by the staff and you will probably be given insulin after surgery (instead of oral diabetes medicines) to make sure you have good blood sugar levels. o The goal for blood sugar control after surgery is 80-180 mg/dL.     WHAT DO I DO ABOUT MY DIABETES MEDICATION?   Marland Kitchen Do not take oral diabetes medicines (pills): metFORMIN (GLUCOPHAGE) the morning of surgery.   7 days prior to surgery STOP taking any Aspirin (unless otherwise instructed by your surgeon), Aleve, Naproxen, Ibuprofen, Motrin, Advil, Goody's, BC's, all herbal medications, fish oil, and all vitamins.    The Morning of Surgery  Do not wear jewelry, make-up or nail polish.  Do not wear lotions, powders, or perfumes/colognes, or deodorant  Do not shave 48 hours prior to surgery.  Men may shave face and neck.  Do not bring valuables to the hospital.  [facility] is not responsible for any belongings or valuables.  If you are a smoker, DO NOT Smoke 24 hours prior to  surgery IF you wear a CPAP at night please bring your mask, tubing, and machine the morning of surgery   Remember that you must have someone to transport you home after your surgery, and remain with you for 24 hours if you are discharged the same day.   Contacts, glasses, hearing aids, dentures or bridgework may not be worn into surgery.    Leave your suitcase in the car.  After surgery it may be brought to your room.  For patients admitted to the hospital, discharge time will be determined by your treatment team.  Patients  discharged the day of surgery will not be allowed to drive home.    Special instructions:   [facility]- Preparing For Surgery  Before surgery, you can play an important role. Because skin is not sterile, your skin needs to be as free of germs as possible. You can reduce the number of germs on your skin by washing with CHG (chlorahexidine gluconate) Soap before surgery.  CHG is an antiseptic cleaner which kills germs and bonds with the skin to continue killing germs even after washing.    Oral Hygiene is also important to reduce your risk of infection.  Remember - BRUSH YOUR TEETH THE MORNING OF SURGERY WITH YOUR REGULAR TOOTHPASTE  Please do not use if you have an allergy to CHG or antibacterial soaps. If your skin becomes reddened/irritated stop using the CHG.  Do not shave (including legs and underarms) for at least 48 hours prior to first CHG shower. It is OK to shave your face.  Please follow these instructions carefully.   1. Shower the NIGHT BEFORE SURGERY and the MORNING OF SURGERY with CHG Soap.   2. If you chose to wash your hair, wash your hair first as usual with your normal shampoo.  3. After you shampoo, rinse your hair and body thoroughly to remove the shampoo.  4. Use CHG as you would any other liquid soap. You can apply CHG directly to the skin and wash gently with a scrungie or a clean washcloth.   5. Apply the CHG Soap to your body ONLY FROM THE NECK DOWN.  Do not use on open wounds or open sores. Avoid contact with your eyes, ears, mouth and genitals (private parts). Wash Face and genitals (private parts)  with your normal soap.   6. Wash thoroughly, paying special attention to the area where your surgery will be performed.  7. Thoroughly rinse your body with warm water from the neck down.  8. DO NOT shower/wash with your normal soap after using and rinsing off the CHG Soap.  9. Pat yourself dry with a CLEAN TOWEL.  10. Wear CLEAN PAJAMAS to bed the night before  surgery, wear comfortable clothes the morning of surgery  11. Place CLEAN SHEETS on your bed the night of your first shower and DO NOT SLEEP WITH PETS.    Day of Surgery:  Do not apply any deodorants/lotions.  Please wear clean clothes to the hospital/surgery center.   Remember to brush your teeth WITH YOUR REGULAR TOOTHPASTE.   Please read over the following fact sheets that you were given.

## 2019-04-17 NOTE — Progress Notes (Signed)
Virtual Visit via Video Note   This visit type was conducted due to national recommendations for restrictions regarding the COVID-19 Pandemic (e.g. social distancing) in an effort to limit this patient's exposure and mitigate transmission in our community.  Due to his co-morbid illnesses, this patient is at least at moderate risk for complications without adequate follow up.  This format is felt to be most appropriate for this patient at this time.  All issues noted in this document were discussed and addressed.  A limited physical exam was performed with this format.  Please refer to the patient's chart for his consent to telehealth for [facility]   Date:  04/17/2019   ID:  Johnathan Murray, DOB 1951/10/07, MRN [id number]  Patient Location: Home Provider Location: Office  PCP:  Nicoletta Dress, MD  Cardiologist:  No primary care provider on file.  Electrophysiologist:  None   Evaluation Performed:  Follow-Up Visit  Chief Complaint: Preoperative cardiovascular stratification  History of Present Illness:    Johnathan Murray is a 68 y.o. male with past medical history of coronary artery disease, essential hypertension and diabetes mellitus.  He mentions to me that he is planning to undergo surgery on his upper back.  He mentions to me that he has been followed by a neurologist and neurosurgeon and he has been having sensory loss and motor loss in the upper lower extremities.  He tells me that his surgery has already been delayed by 6 to 7 weeks and his neurological deficits are getting worse so the surgery is planned.  He takes care of activities of daily living without any symptoms.  No chest pain orthopnea or PND.  At the time of my evaluation, the patient is alert awake oriented and in no distress.  The patient does not have symptoms concerning for COVID-19 infection (fever, chills, cough, or new shortness of breath).    Past Medical History:  Diagnosis Date  . Abnormal  LFTs (liver function tests) 10/31/2016  . Acid reflux 02/15/2018  . Coronary artery disease involving native coronary artery of native heart without angina pectoris 05/30/2016  . Diabetes mellitus due to underlying condition with unspecified complications ([facility] [place]) 0/86/7619  . Dyslipidemia 05/30/2016  . Essential hypertension 05/30/2016  . Hyperactive gag reflex 02/15/2018  . Macular degeneration 02/15/2018  . PAF (paroxysmal atrial fibrillation) ([facility]) 05/30/2016   Past Surgical History:  Procedure Laterality Date  . AORTIC VALVE REPLACEMENT    . CARDIAC CATHETERIZATION     several stents  . CARDIAC SURGERY    . CORONARY ARTERY BYPASS GRAFT     patient unsure how many, had at [facility]  . HAND SURGERY    . KNEE SURGERY    . LEFT HEART CATH AND CORS/GRAFTS ANGIOGRAPHY N/A 05/16/2018   Procedure: LEFT HEART CATH AND CORS/GRAFTS ANGIOGRAPHY;  Surgeon: Burnell Blanks, MD;  Location: [facility];  Service: Cardiovascular;  Laterality: N/A;     Current Meds  Medication Sig  . amLODipine (NORVASC) 10 MG tablet Take 10 mg by mouth daily.   Marland Kitchen aspirin EC 81 MG tablet Take 81 mg by mouth daily.   . benazepril (LOTENSIN) 40 MG tablet Take 40 mg by mouth at bedtime.   . Cyanocobalamin (B-12) 5000 MCG CAPS Take 5,000 mcg by mouth daily.   . cyclobenzaprine (FLEXERIL) 10 MG tablet Take 1 tablet (10 mg total) by mouth at bedtime.  . furosemide (LASIX) 40 MG tablet Take 40 mg by  mouth daily.  Marland Kitchen gabapentin (NEURONTIN) 300 MG capsule TAKE 1 CAPSULE BY MOUTH AT BEDTIME FOR 1 WEEK, THEN 1 CAPSULE BY MOUTH TWICE A DAY (Patient taking differently: Take 300 mg by mouth 2 (two) times daily. )  . levothyroxine (SYNTHROID, LEVOTHROID) 50 MCG tablet Take 50 mcg by mouth daily before breakfast.   . meloxicam (MOBIC) 7.5 MG tablet Take 7.5 mg by mouth 2 (two) times a day.   . metFORMIN (GLUCOPHAGE) 500 MG tablet Take 500 mg by mouth daily.  . nitroGLYCERIN (NITROSTAT) 0.4 MG SL tablet  Place 0.4 mg under the tongue every 5 (five) minutes as needed for chest pain.   . sildenafil (VIAGRA) 100 MG tablet 50 mg as needed.  . testosterone cypionate (DEPOTESTOSTERONE CYPIONATE) 200 MG/ML injection Has not been using since 10/2018  . vitamin C (ASCORBIC ACID) 500 MG tablet Take 500 mg by mouth daily.      Allergies:   Atorvastatin; Metoprolol; and Nebivolol   Social History   Tobacco Use  . Smoking status: Never Smoker  . Smokeless tobacco: Never Used  Substance Use Topics  . Alcohol use: Yes    Alcohol/week: 2.0 - 3.0 standard drinks    Types: 2 - 3 Cans of beer per week    Comment: 1-2 beers nightly.  . Drug use: No     Family Hx: The patient's family history includes Brain cancer in his mother; Heart disease in his maternal grandmother; Prostate cancer in his brother.  ROS:   Please see the history of present illness.    As mentioned above All other systems reviewed and are negative.   Prior CV studies:   The following studies were reviewed today:  Previous coronary angiography was report was reviewed  Labs/Other Tests and Data Reviewed:    EKG:  No ECG reviewed.  Recent Labs: 05/03/2018: Hemoglobin 12.9; Platelets 217 05/21/2018: ALT 13 06/05/2018: BUN 21; Creatinine, Ser 1.76; Potassium 4.4; Sodium 139   Recent Lipid Panel Lab Results  Component Value Date/Time   CHOL 217 (H) 05/21/2018 11:30 AM   TRIG 61 05/21/2018 11:30 AM   HDL 100 05/21/2018 11:30 AM   CHOLHDL 2.2 05/21/2018 11:30 AM   LDLCALC 105 (H) 05/21/2018 11:30 AM    Wt Readings from Last 3 Encounters:  04/17/19 185 lb (83.9 kg)  03/28/19 180 lb (81.6 kg)  03/06/19 175 lb (79.4 kg)     Objective:    Vital Signs:  BP (!) 125/57 (BP Location: Left Arm, Patient Position: Sitting, Cuff Size: Normal)   Pulse 72   Ht 5\' 10"  (1.778 m)   Wt 185 lb (83.9 kg)   BMI 26.54 kg/m    VITAL SIGNS:  reviewed  ASSESSMENT & PLAN:    1. Preoperative risk stratification: The patient has  coronary artery disease.  He is asymptomatic though he leads a fairly sedentary lifestyle.  He also is a diabetic.  Recertification issues were discussed with the patient.  He is asymptomatic at this time.  I discussed with him about the possibility of doing a pharmacological stress test and he is not keen on it.  I understand the urgency of the situation for his surgery.  He is already stopped taking the aspirin as an preparation for the surgery coming up early next week.  I mentioned to him that it would be prudent to go ahead with the surgery especially since the fact that he is experiencing neurologically deficits in his upper extremities and there is a sense  of urgency for this surgery.  I told him that he is moderate risk based on his chart evaluation and he understands and is willing to accept it.  Meticulous hemodynamic monitoring and continuous perioperative beta-blockade will further reduce the risk of coronary events.  He will get back on aspirin as soon as okayed by his Psychologist, sport and exercise.  He understands the risks of being off these medications.  He had multiple questions which were answered to his satisfaction. 2. His blood pressure stable and diet was discussed for dyslipidemia and diabetes mellitus. 3. Patient will be seen in follow-up appointment in 3 months or earlier if the patient has any concerns   COVID-19 Education: The signs and symptoms of COVID-19 were discussed with the patient and how to seek care for testing (follow up with PCP or arrange E-visit).  The importance of social distancing was discussed today.  Time:   Today, I have spent 14 minutes with the patient with telehealth technology discussing the above problems.     Medication Adjustments/Labs and Tests Ordered: Current medicines are reviewed at length with the patient today.  Concerns regarding medicines are outlined above.   Tests Ordered: No orders of the defined types were placed in this encounter.   Medication Changes:  No orders of the defined types were placed in this encounter.   Disposition:  Follow up in 3 month(s)  Signed, Jenean Lindau, MD  04/17/2019 4:39 PM    [place]

## 2019-04-18 ENCOUNTER — Other Ambulatory Visit: Payer: Self-pay

## 2019-04-18 ENCOUNTER — Encounter (HOSPITAL_COMMUNITY)
Admission: RE | Admit: 2019-04-18 | Discharge: 2019-04-18 | Disposition: A | Discharge status: Home / Self Care | Payer: BLUE CROSS/BLUE SHIELD | Source: Ambulatory Visit | Attending: Neurosurgery | Admitting: Neurosurgery

## 2019-04-18 ENCOUNTER — Other Ambulatory Visit (HOSPITAL_COMMUNITY)
Admission: RE | Admit: 2019-04-18 | Discharge: 2019-04-18 | Disposition: A | Discharge status: Home / Self Care | Payer: BLUE CROSS/BLUE SHIELD | Source: Ambulatory Visit | Attending: Neurosurgery | Admitting: Neurosurgery

## 2019-04-18 ENCOUNTER — Encounter (HOSPITAL_COMMUNITY): Payer: Self-pay

## 2019-04-18 DIAGNOSIS — Z951 Presence of aortocoronary bypass graft: Secondary | ICD-10-CM | POA: Diagnosis not present

## 2019-04-18 DIAGNOSIS — E119 Type 2 diabetes mellitus without complications: Secondary | ICD-10-CM | POA: Diagnosis not present

## 2019-04-18 DIAGNOSIS — E039 Hypothyroidism, unspecified: Secondary | ICD-10-CM | POA: Diagnosis not present

## 2019-04-18 DIAGNOSIS — Z79890 Hormone replacement therapy (postmenopausal): Secondary | ICD-10-CM | POA: Insufficient documentation

## 2019-04-18 DIAGNOSIS — I10 Essential (primary) hypertension: Secondary | ICD-10-CM | POA: Insufficient documentation

## 2019-04-18 DIAGNOSIS — I2510 Atherosclerotic heart disease of native coronary artery without angina pectoris: Secondary | ICD-10-CM | POA: Insufficient documentation

## 2019-04-18 DIAGNOSIS — Z1159 Encounter for screening for other viral diseases: Secondary | ICD-10-CM | POA: Diagnosis not present

## 2019-04-18 DIAGNOSIS — Z79899 Other long term (current) drug therapy: Secondary | ICD-10-CM | POA: Insufficient documentation

## 2019-04-18 DIAGNOSIS — Z01818 Encounter for other preprocedural examination: Secondary | ICD-10-CM | POA: Insufficient documentation

## 2019-04-18 DIAGNOSIS — Z952 Presence of prosthetic heart valve: Secondary | ICD-10-CM | POA: Diagnosis not present

## 2019-04-18 DIAGNOSIS — K219 Gastro-esophageal reflux disease without esophagitis: Secondary | ICD-10-CM | POA: Diagnosis not present

## 2019-04-18 DIAGNOSIS — Z7982 Long term (current) use of aspirin: Secondary | ICD-10-CM | POA: Diagnosis not present

## 2019-04-18 DIAGNOSIS — I480 Paroxysmal atrial fibrillation: Secondary | ICD-10-CM | POA: Diagnosis not present

## 2019-04-18 DIAGNOSIS — Z7984 Long term (current) use of oral hypoglycemic drugs: Secondary | ICD-10-CM | POA: Insufficient documentation

## 2019-04-18 HISTORY — DX: Dyspnea, unspecified: R06.00

## 2019-04-18 HISTORY — DX: Hypothyroidism, unspecified: E03.9

## 2019-04-18 LAB — TYPE AND SCREEN
ABO/RH(D): A POS
Antibody Screen: NEGATIVE

## 2019-04-18 LAB — BASIC METABOLIC PANEL
Anion gap: 9 (ref 5–15)
BUN: 29 mg/dL — ABNORMAL HIGH (ref 8–23)
CO2: 24 mmol/L (ref 22–32)
Calcium: 10 mg/dL (ref 8.9–10.3)
Chloride: 103 mmol/L (ref 98–111)
Creatinine, Ser: 1.63 mg/dL — ABNORMAL HIGH (ref 0.61–1.24)
GFR calc Af Amer: 50 mL/min — ABNORMAL LOW (ref >60)
GFR calc non Af Amer: 43 mL/min — ABNORMAL LOW (ref >60)
Glucose, Bld: 157 mg/dL — ABNORMAL HIGH (ref 70–99)
Potassium: 4.7 mmol/L (ref 3.5–5.1)
Sodium: 136 mmol/L (ref 135–145)

## 2019-04-18 LAB — CBC WITH DIFFERENTIAL/PLATELET
Abs Immature Granulocytes: 0.03 10*3/uL (ref 0.00–0.07)
Basophils Absolute: 0.1 10*3/uL (ref 0.0–0.1)
Basophils Relative: 1 %
Eosinophils Absolute: 0.3 10*3/uL (ref 0.0–0.5)
Eosinophils Relative: 5 %
HCT: 36.5 % — ABNORMAL LOW (ref 39.0–52.0)
Hemoglobin: 12.8 g/dL — ABNORMAL LOW (ref 13.0–17.0)
Immature Granulocytes: 1 %
Lymphocytes Relative: 26 %
Lymphs Abs: 1.3 10*3/uL (ref 0.7–4.0)
MCH: 32.2 pg (ref 26.0–34.0)
MCHC: 35.1 g/dL (ref 30.0–36.0)
MCV: 91.7 fL (ref 80.0–100.0)
Monocytes Absolute: 0.5 10*3/uL (ref 0.1–1.0)
Monocytes Relative: 10 %
Neutro Abs: 2.9 10*3/uL (ref 1.7–7.7)
Neutrophils Relative %: 57 %
Platelets: 202 10*3/uL (ref 150–400)
RBC: 3.98 MIL/uL — ABNORMAL LOW (ref 4.22–5.81)
RDW: 12.5 % (ref 11.5–15.5)
WBC: 5.1 10*3/uL (ref 4.0–10.5)
nRBC: 0 % (ref 0.0–0.2)

## 2019-04-18 LAB — HEMOGLOBIN A1C
Hgb A1c MFr Bld: 6 % — ABNORMAL HIGH (ref 4.8–5.6)
Mean Plasma Glucose: 125.5 mg/dL

## 2019-04-18 LAB — ABO/RH: ABO/RH(D): A POS

## 2019-04-18 LAB — SURGICAL PCR SCREEN
MRSA, PCR: NEGATIVE
Staphylococcus aureus: POSITIVE — AB

## 2019-04-18 LAB — GLUCOSE, CAPILLARY: Glucose-Capillary: 158 mg/dL — ABNORMAL HIGH (ref 70–99)

## 2019-04-18 NOTE — Progress Notes (Signed)
PCP - Ludger Nutting   Chest x-ray - 05-03-18 EKG - 10-31-18 Stress Test - 05-01-18 ECHO - 04-25-18 Cardiac Cath - 05-16-18  DM - Type 2 Fasting Blood Sugar - pt stated he does not check his blood sugars at home   Aspirin Instructions: on hold for sx  Anesthesia review: yes, heart history  Patient denies fever, cough and chest pain at PAT appointment Pt stated he has SOB upon exertion.  Not a new finding.   Patient verbalized understanding of instructions that were given to them at the PAT appointment. Patient was also instructed that they will need to review over the PAT instructions again at home before surgery.

## 2019-04-19 LAB — NOVEL CORONAVIRUS, NAA (HOSP ORDER, SEND-OUT TO REF LAB; TAT 18-24 HRS): SARS-CoV-2, NAA: NOT DETECTED

## 2019-04-19 NOTE — Progress Notes (Signed)
Anesthesia Chart Review:  Case:  [id number] Date/Time:  04/22/19 1049   Procedure:  ACDF - C4-C5 - C5-C6 (N/A )   Anesthesia type:  General   Pre-op diagnosis:  Stenosis   Location:  [facility] OR ROOM 38 / [facility]   Surgeon:  Earnie Larsson, MD      DISCUSSION: 68 yo male never smoker. Pertinent hx includes CAD s/p CABG and AVR replacement 2017 and PCI 2018, Paroxysmal Afib, HTN, DMII, Hypothyroid, GERD.  Follows with Dr. Geraldo Pitter for CAD and Paroxysmal Afib. He had CABG and AVR in 2017. Subsequently had DES to the distal anastomosis of LIMA to LAD in 2018. In June 2019 he had an Echo that was concerning for restenosis of prosthetic aortic valve. He also had a nuclear stress test that was intermediate risk due to EF 54% and a small defect of mild severity present in the mid anterior and mid anterolateral location. Dr. Geraldo Pitter recommended [place] which was done 05/16/18 and showed mild pressure gradient in the prosthetic AVR (10 mmHg) suggesting the valve is functioning well. Medical management was recommended for multivessel CAD.  Pt was seen by Dr Geraldo Pitter for preop clearance 04/17/19. Per his note: "Preoperative risk stratification: The patient has coronary artery disease.  He is asymptomatic though he leads a fairly sedentary lifestyle.  He also is a diabetic.  Recertification issues were discussed with the patient.  He is asymptomatic at this time.  I discussed with him about the possibility of doing a pharmacological stress test and he is not keen on it.  I understand the urgency of the situation for his surgery.  He is already stopped taking the aspirin as an preparation for the surgery coming up early next week.  I mentioned to him that it would be prudent to go ahead with the surgery especially since the fact that he is experiencing neurologically deficits in his upper extremities and there is a sense of urgency for this surgery.  I told him that he is moderate risk based on his chart evaluation and he  understands and is willing to accept it.  Meticulous hemodynamic monitoring and continuous perioperative beta-blockade will further reduce the risk of coronary events.  He will get back on aspirin as soon as okayed by his Psychologist, sport and exercise.  He understands the risks of being off these medications.  He had multiple questions which were answered to his satisfaction."  Anticipate he can proceed as planned barring acute status change.   VS: BP 137/68   Pulse 86   Temp 36.9 C   Resp 20   Ht 5\' 10"  (1.778 m)   Wt 83.9 kg   SpO2 98%   BMI 26.54 kg/m   PROVIDERS: Nicoletta Dress, MD is [facility]  Revankar, Sunny Schlein, MD is Cardiologist  LABS: Labs reviewed: Acceptable for surgery. Review of preivous labs indicates mild renal insufficiency (all labs ordered are listed, but only abnormal results are displayed)  Labs Reviewed  SURGICAL PCR SCREEN - Abnormal; Notable for the following components:      Result Value   Staphylococcus aureus POSITIVE (*)    All other components within normal limits  GLUCOSE, CAPILLARY - Abnormal; Notable for the following components:   Glucose-Capillary 158 (*)    All other components within normal limits  BASIC METABOLIC PANEL - Abnormal; Notable for the following components:   Glucose, Bld 157 (*)    BUN 29 (*)    Creatinine, Ser 1.63 (*)    GFR calc non Af  Amer 43 (*)    GFR calc Af Amer 50 (*)    All other components within normal limits  CBC WITH DIFFERENTIAL/PLATELET - Abnormal; Notable for the following components:   RBC 3.98 (*)    Hemoglobin 12.8 (*)    HCT 36.5 (*)    All other components within normal limits  HEMOGLOBIN A1C - Abnormal; Notable for the following components:   Hgb A1c MFr Bld 6.0 (*)    All other components within normal limits  TYPE AND SCREEN  ABO/RH     IMAGES: CHEST - 2 VIEW 05/03/18:  COMPARISON:  03/21/2018  FINDINGS: Cardiac shadows within normal limits. Postoperative changes are noted. Lungs are hyperaerated without focal  infiltrate or sizable effusion. No acute bony abnormality is noted.  IMPRESSION: COPD without acute abnormality.   EKG: 10/31/2018: NSR. Rate 60. Septal infarct, age undetermined.   CV: Cath 05/16/2018: 1. Severe double vessel CAD 2. Chronic sub-total occlusion of the proximal LAD. The mid and distal LAD fills from the patent LIMA graft. The more distal Diagonal branch fills from the IMA graft. The first Diagonal branch appears to be occluded. The stent in the anastomosis of the LIMA to the LAD is patent. It appears that the occluded Diagonal branch has a stent. There is no flow down this vessel antegrade but some collateral filling 3. The Circumflex artery has mild proximal stenosis and moderate mid stenosis leading into the first major OM branch. The inferior sub-branch of OM2 has a 70% stenosis. This appears too small for PCI.  4. The RCA is a large dominant vessel with heavy calcification in the proximal and mid segment. 40% mid stenosis.  5. I crossed the bioprosthetic aortic valve. There is a mild pressure gradient on pull back (10 mmHg) suggesting the valve is functioning well.   Recommendations: Continue medical management of CAD.   Nuclear stress 05/01/2018:  Nuclear stress EF: 54%. The left ventricular ejection fraction is mildly decreased (45-54%).  Defect 1: There is a small reversible defect of mild severity present in the mid anterior and mid anterolateral location.  Findings consistent with mid anterior and anterolateral ischemia.  The left ventricular ejection fraction is mildly decreased (45-54%).  This is an intermediate risk study.  Event monitor 04/25/2018: Conclusion:  The event monitoring was largely unremarkable.  The patient had one ventricular triplet.  Asymptomatic.  TTE 04/25/2018: - Left ventricle: The cavity size was normal. There was moderate   concentric hypertrophy. Systolic function was normal. The   estimated ejection fraction was in the range of  60% to 65%. Wall   motion was normal; there were no regional wall motion   abnormalities. - Aortic valve: A bioprosthesis was present. The prosthesis had   reduced range of motion. There was mild to moderate stenosis.   Valve area (VTI): 1.08 cm^2. Valve area (Vmax): 1.31 cm^2. Valve   area (Vmean): 1.21 cm^2. - Mitral valve: There was moderate regurgitation. Valve area by   continuity equation (using LVOT flow): 1.77 cm^2. - Left atrium: The atrium was mildly dilated.  Impressions:  - 1. Mild to moderate stenosis of the prosthetic aortic valve. The   valve is a 23 mm Edward tissue valve. Details recorded above.   2. Normal left ventricular systolic function   3. Moderate posteriorly directed mitral regurgitation   4. Left atrial enlargement mild.   Past Medical History:  Diagnosis Date  . Abnormal LFTs (liver function tests) 10/31/2016  . Acid reflux 02/15/2018  .  Coronary artery disease involving native coronary artery of native heart without angina pectoris 05/30/2016  . Diabetes mellitus due to underlying condition with unspecified complications ([place]) 9/74/1638  . Dyslipidemia 05/30/2016  . Dyspnea    upon exertion  . Essential hypertension 05/30/2016  . Hyperactive gag reflex 02/15/2018  . Hypothyroidism   . Macular degeneration 02/15/2018  . PAF (paroxysmal atrial fibrillation) ([place]) 05/30/2016    Past Surgical History:  Procedure Laterality Date  . AORTIC VALVE REPLACEMENT    . CARDIAC CATHETERIZATION     several stents  . CARDIAC SURGERY    . CORONARY ARTERY BYPASS GRAFT     patient unsure how many, had at [facility]  . HAND SURGERY    . KNEE SURGERY    . LEFT HEART CATH AND CORS/GRAFTS ANGIOGRAPHY N/A 05/16/2018   Procedure: LEFT HEART CATH AND CORS/GRAFTS ANGIOGRAPHY;  Surgeon: Burnell Blanks, MD;  Location: [place] [facility];  Service: Cardiovascular;  Laterality: N/A;    MEDICATIONS: . amLODipine (NORVASC) 10 MG tablet  . aspirin EC  81 MG tablet  . benazepril (LOTENSIN) 40 MG tablet  . Cyanocobalamin (B-12) 5000 MCG CAPS  . cyclobenzaprine (FLEXERIL) 10 MG tablet  . ezetimibe (ZETIA) 10 MG tablet  . furosemide (LASIX) 40 MG tablet  . gabapentin (NEURONTIN) 300 MG capsule  . levothyroxine (SYNTHROID, LEVOTHROID) 50 MCG tablet  . meloxicam (MOBIC) 7.5 MG tablet  . metFORMIN (GLUCOPHAGE) 500 MG tablet  . nitroGLYCERIN (NITROSTAT) 0.4 MG SL tablet  . sildenafil (VIAGRA) 100 MG tablet  . testosterone cypionate (DEPOTESTOSTERONE CYPIONATE) 200 MG/ML injection  . vitamin C (ASCORBIC ACID) 500 MG tablet   No current facility-administered medications for this encounter.     Wynonia Musty [facility] Short Stay Center/Anesthesiology Phone [phone] 04/19/2019 9:58 AM

## 2019-04-19 NOTE — Anesthesia Preprocedure Evaluation (Addendum)
Anesthesia Evaluation  Patient identified by MRN, date of birth, ID band Patient awake    Reviewed: Allergy & Precautions, NPO status , Patient's Chart, lab work & pertinent test results  Airway Mallampati: I  TM Distance: >3 FB Neck ROM: Full    Dental   Pulmonary    Pulmonary exam normal        Cardiovascular hypertension, Pt. on medications Normal cardiovascular exam     Neuro/Psych    GI/Hepatic GERD  Medicated and Controlled,  Endo/Other  diabetes, Type 2, Oral Hypoglycemic Agents  Renal/GU      Musculoskeletal   Abdominal   Peds  Hematology   Anesthesia Other Findings   Reproductive/Obstetrics                            Anesthesia Physical Anesthesia Plan  ASA: III  Anesthesia Plan: General   Post-op Pain Management:    Induction: Intravenous  PONV Risk Score and Plan: 2 and Ondansetron and Midazolam  Airway Management Planned: Oral ETT  Additional Equipment:   Intra-op Plan:   Post-operative Plan: Extubation in OR  Informed Consent: I have reviewed the patients History and Physical, chart, labs and discussed the procedure including the risks, benefits and alternatives for the proposed anesthesia with the patient or authorized representative who has indicated his/her understanding and acceptance.       Plan Discussed with: CRNA and Surgeon  Anesthesia Plan Comments: (See PAT note by Karoline Caldwell, PA-C )       Anesthesia Quick Evaluation

## 2019-04-22 ENCOUNTER — Ambulatory Visit (HOSPITAL_COMMUNITY): Payer: BC Managed Care – PPO | Admitting: Physician Assistant

## 2019-04-22 ENCOUNTER — Inpatient Hospital Stay (HOSPITAL_COMMUNITY)
Admission: AD | Disposition: A | Discharge status: Home / Self Care | Payer: Self-pay | Source: Home / Self Care | Attending: Neurosurgery

## 2019-04-22 ENCOUNTER — Ambulatory Visit (HOSPITAL_COMMUNITY): Payer: BC Managed Care – PPO | Admitting: Anesthesiology

## 2019-04-22 ENCOUNTER — Inpatient Hospital Stay (HOSPITAL_COMMUNITY)
Admission: AD | Admit: 2019-04-22 | Discharge: 2019-04-23 | DRG: 473 | Disposition: A | Discharge status: Home / Self Care | Payer: BC Managed Care – PPO | Attending: Neurosurgery | Admitting: Neurosurgery

## 2019-04-22 ENCOUNTER — Encounter (HOSPITAL_COMMUNITY): Payer: Self-pay

## 2019-04-22 ENCOUNTER — Other Ambulatory Visit: Payer: Self-pay

## 2019-04-22 DIAGNOSIS — Z79890 Hormone replacement therapy (postmenopausal): Secondary | ICD-10-CM

## 2019-04-22 DIAGNOSIS — Z79899 Other long term (current) drug therapy: Secondary | ICD-10-CM | POA: Diagnosis not present

## 2019-04-22 DIAGNOSIS — I10 Essential (primary) hypertension: Secondary | ICD-10-CM | POA: Diagnosis not present

## 2019-04-22 DIAGNOSIS — Z7289 Other problems related to lifestyle: Secondary | ICD-10-CM

## 2019-04-22 DIAGNOSIS — Z955 Presence of coronary angioplasty implant and graft: Secondary | ICD-10-CM | POA: Diagnosis not present

## 2019-04-22 DIAGNOSIS — I2510 Atherosclerotic heart disease of native coronary artery without angina pectoris: Secondary | ICD-10-CM | POA: Diagnosis present

## 2019-04-22 DIAGNOSIS — E119 Type 2 diabetes mellitus without complications: Secondary | ICD-10-CM | POA: Diagnosis not present

## 2019-04-22 DIAGNOSIS — E039 Hypothyroidism, unspecified: Secondary | ICD-10-CM | POA: Diagnosis present

## 2019-04-22 DIAGNOSIS — K219 Gastro-esophageal reflux disease without esophagitis: Secondary | ICD-10-CM | POA: Diagnosis present

## 2019-04-22 DIAGNOSIS — Z7982 Long term (current) use of aspirin: Secondary | ICD-10-CM

## 2019-04-22 DIAGNOSIS — G959 Disease of spinal cord, unspecified: Secondary | ICD-10-CM

## 2019-04-22 DIAGNOSIS — H3530 Unspecified macular degeneration: Secondary | ICD-10-CM | POA: Diagnosis present

## 2019-04-22 DIAGNOSIS — I480 Paroxysmal atrial fibrillation: Secondary | ICD-10-CM | POA: Diagnosis not present

## 2019-04-22 DIAGNOSIS — Z20828 Contact with and (suspected) exposure to other viral communicable diseases: Secondary | ICD-10-CM | POA: Diagnosis not present

## 2019-04-22 DIAGNOSIS — Z8249 Family history of ischemic heart disease and other diseases of the circulatory system: Secondary | ICD-10-CM | POA: Diagnosis not present

## 2019-04-22 DIAGNOSIS — Z952 Presence of prosthetic heart valve: Secondary | ICD-10-CM

## 2019-04-22 DIAGNOSIS — M2578 Osteophyte, vertebrae: Secondary | ICD-10-CM | POA: Diagnosis present

## 2019-04-22 DIAGNOSIS — E785 Hyperlipidemia, unspecified: Secondary | ICD-10-CM | POA: Diagnosis not present

## 2019-04-22 DIAGNOSIS — Z7984 Long term (current) use of oral hypoglycemic drugs: Secondary | ICD-10-CM

## 2019-04-22 DIAGNOSIS — M4802 Spinal stenosis, cervical region: Secondary | ICD-10-CM | POA: Diagnosis not present

## 2019-04-22 DIAGNOSIS — Z888 Allergy status to other drugs, medicaments and biological substances status: Secondary | ICD-10-CM

## 2019-04-22 DIAGNOSIS — Z951 Presence of aortocoronary bypass graft: Secondary | ICD-10-CM | POA: Diagnosis not present

## 2019-04-22 DIAGNOSIS — G992 Myelopathy in diseases classified elsewhere: Secondary | ICD-10-CM | POA: Diagnosis not present

## 2019-04-22 DIAGNOSIS — G9589 Other specified diseases of spinal cord: Secondary | ICD-10-CM | POA: Diagnosis present

## 2019-04-22 DIAGNOSIS — M50021 Cervical disc disorder at C4-C5 level with myelopathy: Principal | ICD-10-CM | POA: Diagnosis present

## 2019-04-22 DIAGNOSIS — Z419 Encounter for procedure for purposes other than remedying health state, unspecified: Secondary | ICD-10-CM

## 2019-04-22 HISTORY — PX: ANTERIOR CERVICAL DECOMP/DISCECTOMY FUSION: SHX1161

## 2019-04-22 HISTORY — DX: Disease of spinal cord, unspecified: G95.9

## 2019-04-22 LAB — GLUCOSE, CAPILLARY
Glucose-Capillary: 153 mg/dL — ABNORMAL HIGH (ref 70–99)
Glucose-Capillary: 147 mg/dL — ABNORMAL HIGH (ref 70–99)
Glucose-Capillary: 179 mg/dL — ABNORMAL HIGH (ref 70–99)
Glucose-Capillary: 251 mg/dL — ABNORMAL HIGH (ref 70–99)

## 2019-04-22 SURGERY — ANTERIOR CERVICAL DECOMPRESSION/DISCECTOMY FUSION 2 LEVELS
Anesthesia: General | Site: Neck

## 2019-04-22 MED ORDER — SUGAMMADEX SODIUM 200 MG/2ML IV SOLN
INTRAVENOUS | Status: DC | PRN
  Administered 2019-04-22: 200 mg via INTRAVENOUS

## 2019-04-22 MED ORDER — CEFAZOLIN SODIUM-DEXTROSE 2-4 GM/100ML-% IV SOLN
2.0000 g | INTRAVENOUS | Status: AC
  Administered 2019-04-22: 2 g via INTRAVENOUS

## 2019-04-22 MED ORDER — SODIUM CHLORIDE 0.9 % IV SOLN
INTRAVENOUS | Status: DC | PRN
  Administered 2019-04-22: 25 ug/min via INTRAVENOUS

## 2019-04-22 MED ORDER — THROMBIN 5000 UNITS EX SOLR
CUTANEOUS | Status: DC | PRN
  Administered 2019-04-22 (×3): 5000 [IU] via TOPICAL

## 2019-04-22 MED ORDER — SODIUM CHLORIDE 0.9 % IV SOLN
INTRAVENOUS | Status: DC | PRN
  Administered 2019-04-22: 12:00:00

## 2019-04-22 MED ORDER — METFORMIN HCL 500 MG PO TABS
500.0000 mg | ORAL_TABLET | Freq: Every day | ORAL | Status: DC
  Administered 2019-04-22 – 2019-04-23 (×2): 500 mg via ORAL
  Filled 2019-04-22 (×2): qty 1

## 2019-04-22 MED ORDER — THROMBIN 5000 UNITS EX SOLR
CUTANEOUS | Status: AC
  Filled 2019-04-22: qty 15000

## 2019-04-22 MED ORDER — PROPOFOL 10 MG/ML IV BOLUS
INTRAVENOUS | Status: DC | PRN
  Administered 2019-04-22: 120 mg via INTRAVENOUS

## 2019-04-22 MED ORDER — SODIUM CHLORIDE 0.9% FLUSH: 3.0000 mL | INTRAVENOUS | Status: DC | PRN

## 2019-04-22 MED ORDER — EZETIMIBE 10 MG PO TABS
10.0000 mg | ORAL_TABLET | Freq: Every day | ORAL | Status: DC
  Administered 2019-04-22 – 2019-04-23 (×2): 10 mg via ORAL
  Filled 2019-04-22 (×2): qty 1

## 2019-04-22 MED ORDER — CYCLOBENZAPRINE HCL 10 MG PO TABS
ORAL_TABLET | ORAL | Status: AC
  Filled 2019-04-22: qty 1

## 2019-04-22 MED ORDER — DEXAMETHASONE SODIUM PHOSPHATE 10 MG/ML IJ SOLN
INTRAMUSCULAR | Status: AC
  Filled 2019-04-22: qty 1

## 2019-04-22 MED ORDER — THROMBIN 5000 UNITS EX SOLR
CUTANEOUS | Status: AC
  Filled 2019-04-22: qty 5000

## 2019-04-22 MED ORDER — ROCURONIUM BROMIDE 100 MG/10ML IV SOLN
INTRAVENOUS | Status: DC | PRN
  Administered 2019-04-22: 100 mg via INTRAVENOUS

## 2019-04-22 MED ORDER — PHENOL 1.4 % MT LIQD: 1.0000 | OROMUCOSAL | Status: DC | PRN

## 2019-04-22 MED ORDER — HYDROMORPHONE HCL 1 MG/ML IJ SOLN
INTRAMUSCULAR | Status: AC
  Filled 2019-04-22: qty 1

## 2019-04-22 MED ORDER — SODIUM CHLORIDE 0.9% FLUSH
3.0000 mL | Freq: Two times a day (BID) | INTRAVENOUS | Status: DC
  Administered 2019-04-22 – 2019-04-23 (×2): 3 mL via INTRAVENOUS

## 2019-04-22 MED ORDER — LIDOCAINE 2% (20 MG/ML) 5 ML SYRINGE
INTRAMUSCULAR | Status: DC | PRN
  Administered 2019-04-22: 100 mg via INTRAVENOUS

## 2019-04-22 MED ORDER — CEFAZOLIN SODIUM-DEXTROSE 1-4 GM/50ML-% IV SOLN
1.0000 g | Freq: Three times a day (TID) | INTRAVENOUS | Status: AC
  Administered 2019-04-22 – 2019-04-23 (×2): 1 g via INTRAVENOUS
  Filled 2019-04-22 (×2): qty 50

## 2019-04-22 MED ORDER — INSULIN ASPART 100 UNIT/ML ~~LOC~~ SOLN
0.0000 [IU] | Freq: Every day | SUBCUTANEOUS | Status: DC
  Administered 2019-04-23: 3 [IU] via SUBCUTANEOUS

## 2019-04-22 MED ORDER — ONDANSETRON HCL 4 MG/2ML IJ SOLN
INTRAMUSCULAR | Status: DC | PRN
  Administered 2019-04-22: 4 mg via INTRAVENOUS

## 2019-04-22 MED ORDER — CEFAZOLIN SODIUM-DEXTROSE 2-4 GM/100ML-% IV SOLN
INTRAVENOUS | Status: AC
  Filled 2019-04-22: qty 100

## 2019-04-22 MED ORDER — BENAZEPRIL HCL 20 MG PO TABS
40.0000 mg | ORAL_TABLET | Freq: Every day | ORAL | Status: DC
  Administered 2019-04-22: 40 mg via ORAL
  Filled 2019-04-22: qty 2

## 2019-04-22 MED ORDER — DEXAMETHASONE SODIUM PHOSPHATE 10 MG/ML IJ SOLN
10.0000 mg | INTRAMUSCULAR | Status: AC
  Administered 2019-04-22: 5 mg via INTRAVENOUS

## 2019-04-22 MED ORDER — ONDANSETRON HCL 4 MG/2ML IJ SOLN
INTRAMUSCULAR | Status: AC
  Filled 2019-04-22: qty 2

## 2019-04-22 MED ORDER — CHLORHEXIDINE GLUCONATE CLOTH 2 % EX PADS: 6.0000 | MEDICATED_PAD | Freq: Once | CUTANEOUS | Status: DC

## 2019-04-22 MED ORDER — HYDROMORPHONE HCL 1 MG/ML IJ SOLN
0.2500 mg | INTRAMUSCULAR | Status: DC | PRN
  Administered 2019-04-22: 0.5 mg via INTRAVENOUS

## 2019-04-22 MED ORDER — MEPERIDINE HCL 25 MG/ML IJ SOLN: 6.2500 mg | INTRAMUSCULAR | Status: DC | PRN

## 2019-04-22 MED ORDER — PROPOFOL 10 MG/ML IV BOLUS
INTRAVENOUS | Status: AC
  Filled 2019-04-22: qty 20

## 2019-04-22 MED ORDER — AMLODIPINE BESYLATE 10 MG PO TABS
10.0000 mg | ORAL_TABLET | Freq: Every day | ORAL | Status: DC
  Administered 2019-04-22 – 2019-04-23 (×2): 10 mg via ORAL
  Filled 2019-04-22 (×2): qty 1

## 2019-04-22 MED ORDER — GABAPENTIN 300 MG PO CAPS
300.0000 mg | ORAL_CAPSULE | Freq: Two times a day (BID) | ORAL | Status: DC
  Administered 2019-04-22 – 2019-04-23 (×2): 300 mg via ORAL
  Filled 2019-04-22 (×2): qty 1

## 2019-04-22 MED ORDER — LACTATED RINGERS IV SOLN
INTRAVENOUS | Status: DC | PRN
  Administered 2019-04-22 (×2): via INTRAVENOUS

## 2019-04-22 MED ORDER — THROMBIN 5000 UNITS EX SOLR
OROMUCOSAL | Status: DC | PRN
  Administered 2019-04-22 (×2): via TOPICAL

## 2019-04-22 MED ORDER — FENTANYL CITRATE (PF) 250 MCG/5ML IJ SOLN
INTRAMUSCULAR | Status: AC
  Filled 2019-04-22: qty 5

## 2019-04-22 MED ORDER — MIDAZOLAM HCL 2 MG/2ML IJ SOLN
INTRAMUSCULAR | Status: AC
  Filled 2019-04-22: qty 2

## 2019-04-22 MED ORDER — LACTATED RINGERS IV SOLN: INTRAVENOUS | Status: DC

## 2019-04-22 MED ORDER — CYCLOBENZAPRINE HCL 10 MG PO TABS
10.0000 mg | ORAL_TABLET | Freq: Three times a day (TID) | ORAL | Status: DC | PRN
  Administered 2019-04-22: 10 mg via ORAL

## 2019-04-22 MED ORDER — HEMOSTATIC AGENTS (NO CHARGE) OPTIME
TOPICAL | Status: DC | PRN
  Administered 2019-04-22: 1 via TOPICAL

## 2019-04-22 MED ORDER — ACETAMINOPHEN 325 MG PO TABS: 650.0000 mg | ORAL_TABLET | ORAL | Status: DC | PRN

## 2019-04-22 MED ORDER — LIDOCAINE 2% (20 MG/ML) 5 ML SYRINGE
INTRAMUSCULAR | Status: AC
  Filled 2019-04-22: qty 15

## 2019-04-22 MED ORDER — ONDANSETRON HCL 4 MG/2ML IJ SOLN: 4.0000 mg | Freq: Four times a day (QID) | INTRAMUSCULAR | Status: DC | PRN

## 2019-04-22 MED ORDER — ROCURONIUM BROMIDE 10 MG/ML (PF) SYRINGE
PREFILLED_SYRINGE | INTRAVENOUS | Status: AC
  Filled 2019-04-22: qty 30

## 2019-04-22 MED ORDER — VITAMIN C 500 MG PO TABS
500.0000 mg | ORAL_TABLET | Freq: Every day | ORAL | Status: DC
  Administered 2019-04-22 – 2019-04-23 (×2): 500 mg via ORAL
  Filled 2019-04-22 (×2): qty 1

## 2019-04-22 MED ORDER — NITROGLYCERIN 0.4 MG SL SUBL: 0.4000 mg | SUBLINGUAL_TABLET | SUBLINGUAL | Status: DC | PRN

## 2019-04-22 MED ORDER — MENTHOL 3 MG MT LOZG: 1.0000 | LOZENGE | OROMUCOSAL | Status: DC | PRN

## 2019-04-22 MED ORDER — PHENYLEPHRINE 40 MCG/ML (10ML) SYRINGE FOR IV PUSH (FOR BLOOD PRESSURE SUPPORT)
PREFILLED_SYRINGE | INTRAVENOUS | Status: AC
  Filled 2019-04-22: qty 10

## 2019-04-22 MED ORDER — SODIUM CHLORIDE 0.9 % IV SOLN
250.0000 mL | INTRAVENOUS | Status: DC
  Administered 2019-04-23: 250 mL via INTRAVENOUS

## 2019-04-22 MED ORDER — HYDROCODONE-ACETAMINOPHEN 10-325 MG PO TABS: 2.0000 | ORAL_TABLET | ORAL | Status: DC | PRN

## 2019-04-22 MED ORDER — ONDANSETRON HCL 4 MG PO TABS: 4.0000 mg | ORAL_TABLET | Freq: Four times a day (QID) | ORAL | Status: DC | PRN

## 2019-04-22 MED ORDER — HYDROMORPHONE HCL 1 MG/ML IJ SOLN: 1.0000 mg | INTRAMUSCULAR | Status: DC | PRN

## 2019-04-22 MED ORDER — FENTANYL CITRATE (PF) 100 MCG/2ML IJ SOLN
INTRAMUSCULAR | Status: DC | PRN
  Administered 2019-04-22: 100 ug via INTRAVENOUS
  Administered 2019-04-22: 150 ug via INTRAVENOUS

## 2019-04-22 MED ORDER — MIDAZOLAM HCL 5 MG/5ML IJ SOLN
INTRAMUSCULAR | Status: DC | PRN
  Administered 2019-04-22: 2 mg via INTRAVENOUS

## 2019-04-22 MED ORDER — FUROSEMIDE 40 MG PO TABS
40.0000 mg | ORAL_TABLET | Freq: Every day | ORAL | Status: DC
  Administered 2019-04-23: 09:00:00 40 mg via ORAL
  Filled 2019-04-22 (×2): qty 1

## 2019-04-22 MED ORDER — 0.9 % SODIUM CHLORIDE (POUR BTL) OPTIME
TOPICAL | Status: DC | PRN
  Administered 2019-04-22: 1000 mL

## 2019-04-22 MED ORDER — INSULIN ASPART 100 UNIT/ML ~~LOC~~ SOLN
0.0000 [IU] | Freq: Three times a day (TID) | SUBCUTANEOUS | Status: DC
  Administered 2019-04-22: 3 [IU] via SUBCUTANEOUS
  Administered 2019-04-23: 07:00:00 2 [IU] via SUBCUTANEOUS

## 2019-04-22 MED ORDER — VITAMIN B-12 1000 MCG PO TABS
5000.0000 ug | ORAL_TABLET | Freq: Every day | ORAL | Status: DC
  Administered 2019-04-22 – 2019-04-23 (×2): 5000 ug via ORAL
  Filled 2019-04-22 (×2): qty 5

## 2019-04-22 MED ORDER — HYDROCODONE-ACETAMINOPHEN 5-325 MG PO TABS: 1.0000 | ORAL_TABLET | ORAL | Status: DC | PRN

## 2019-04-22 MED ORDER — ACETAMINOPHEN 650 MG RE SUPP: 650.0000 mg | RECTAL | Status: DC | PRN

## 2019-04-22 MED ORDER — ONDANSETRON HCL 4 MG/2ML IJ SOLN: 4.0000 mg | Freq: Once | INTRAMUSCULAR | Status: DC | PRN

## 2019-04-22 MED ORDER — INSULIN ASPART 100 UNIT/ML ~~LOC~~ SOLN: 0.0000 [IU] | Freq: Three times a day (TID) | SUBCUTANEOUS | Status: DC

## 2019-04-22 MED ORDER — LEVOTHYROXINE SODIUM 50 MCG PO TABS
50.0000 ug | ORAL_TABLET | Freq: Every day | ORAL | Status: DC
  Administered 2019-04-23: 50 ug via ORAL
  Filled 2019-04-22: qty 1

## 2019-04-22 SURGICAL SUPPLY — 59 items
BAG DECANTER FOR FLEXI CONT (MISCELLANEOUS) ×2 IMPLANT
BENZOIN TINCTURE PRP APPL 2/3 (GAUZE/BANDAGES/DRESSINGS) ×2 IMPLANT
BIT DRILL 13 (BIT) ×2 IMPLANT
BUR MATCHSTICK NEURO 3.0 LAGG (BURR) ×2 IMPLANT
CAGE PEEK 6X14X11 (Cage) ×2 IMPLANT
CANISTER SUCT 3000ML PPV (MISCELLANEOUS) ×2 IMPLANT
CARTRIDGE OIL MAESTRO DRILL (MISCELLANEOUS) ×1 IMPLANT
COVER WAND RF STERILE (DRAPES) IMPLANT
DIFFUSER DRILL AIR PNEUMATIC (MISCELLANEOUS) ×2 IMPLANT
DRAPE C-ARM 42X72 X-RAY (DRAPES) ×4 IMPLANT
DRAPE LAPAROTOMY 100X72 PEDS (DRAPES) ×2 IMPLANT
DRAPE MICROSCOPE LEICA (MISCELLANEOUS) ×2 IMPLANT
DURAPREP 6ML APPLICATOR 50/CS (WOUND CARE) ×2 IMPLANT
ELECT COATED BLADE 2.86 ST (ELECTRODE) ×2 IMPLANT
ELECT REM PT RETURN 9FT ADLT (ELECTROSURGICAL) ×2
ELECTRODE REM PT RTRN 9FT ADLT (ELECTROSURGICAL) ×1 IMPLANT
GAUZE 4X4 16PLY RFD (DISPOSABLE) IMPLANT
GAUZE SPONGE 4X4 12PLY STRL (GAUZE/BANDAGES/DRESSINGS) ×2 IMPLANT
GLOVE BIO SURGEON STRL SZ 6.5 (GLOVE) ×2 IMPLANT
GLOVE BIOGEL PI IND STRL 6.5 (GLOVE) ×2 IMPLANT
GLOVE BIOGEL PI IND STRL 7.5 (GLOVE) ×2 IMPLANT
GLOVE BIOGEL PI INDICATOR 6.5 (GLOVE) ×2
GLOVE BIOGEL PI INDICATOR 7.5 (GLOVE) ×2
GLOVE ECLIPSE 9.0 STRL (GLOVE) ×2 IMPLANT
GLOVE EXAM NITRILE XL STR (GLOVE) IMPLANT
GLOVE SURG SS PI 6.0 STRL IVOR (GLOVE) ×2 IMPLANT
GLOVE SURG SS PI 7.0 STRL IVOR (GLOVE) ×6 IMPLANT
GOWN STRL REUS W/ TWL LRG LVL3 (GOWN DISPOSABLE) ×3 IMPLANT
GOWN STRL REUS W/ TWL XL LVL3 (GOWN DISPOSABLE) ×1 IMPLANT
GOWN STRL REUS W/TWL 2XL LVL3 (GOWN DISPOSABLE) IMPLANT
GOWN STRL REUS W/TWL LRG LVL3 (GOWN DISPOSABLE) ×3
GOWN STRL REUS W/TWL XL LVL3 (GOWN DISPOSABLE) ×1
HALTER HD/CHIN CERV TRACTION D (MISCELLANEOUS) ×2 IMPLANT
HEMOSTAT POWDER KIT SURGIFOAM (HEMOSTASIS) ×4 IMPLANT
HEMOSTAT SURGICEL 2X14 (HEMOSTASIS) IMPLANT
KIT BASIN OR (CUSTOM PROCEDURE TRAY) ×2 IMPLANT
KIT TURNOVER KIT B (KITS) ×2 IMPLANT
NEEDLE SPNL 20GX3.5 QUINCKE YW (NEEDLE) ×2 IMPLANT
NS IRRIG 1000ML POUR BTL (IV SOLUTION) ×2 IMPLANT
OIL CARTRIDGE MAESTRO DRILL (MISCELLANEOUS) ×2
PACK LAMINECTOMY NEURO (CUSTOM PROCEDURE TRAY) ×2 IMPLANT
PAD ARMBOARD 7.5X6 YLW CONV (MISCELLANEOUS) ×6 IMPLANT
PLATE ELITE 42MM (Plate) ×2 IMPLANT
RUBBERBAND STERILE (MISCELLANEOUS) ×4 IMPLANT
SCREW ST 13X4XST VA NS SPNE (Screw) ×6 IMPLANT
SCREW ST VAR 4 ATL (Screw) ×6 IMPLANT
SPACER SPNL 11X14X6XPEEK CVD (Cage) ×2 IMPLANT
SPCR SPNL 11X14X6XPEEK CVD (Cage) ×2 IMPLANT
SPONGE INTESTINAL PEANUT (DISPOSABLE) ×2 IMPLANT
SPONGE SURGIFOAM ABS GEL SZ50 (HEMOSTASIS) ×2 IMPLANT
STRIP CLOSURE SKIN 1/2X4 (GAUZE/BANDAGES/DRESSINGS) ×2 IMPLANT
SUT VIC AB 3-0 SH 8-18 (SUTURE) ×2 IMPLANT
SUT VIC AB 4-0 RB1 18 (SUTURE) ×2 IMPLANT
TAPE CLOTH 4X10 WHT NS (GAUZE/BANDAGES/DRESSINGS) IMPLANT
TAPE CLOTH SURG 4X10 WHT LF (GAUZE/BANDAGES/DRESSINGS) ×2 IMPLANT
TOWEL GREEN STERILE (TOWEL DISPOSABLE) ×2 IMPLANT
TOWEL GREEN STERILE FF (TOWEL DISPOSABLE) ×2 IMPLANT
TRAP SPECIMEN MUCOUS 40CC (MISCELLANEOUS) ×2 IMPLANT
WATER STERILE IRR 1000ML POUR (IV SOLUTION) ×2 IMPLANT

## 2019-04-22 NOTE — Anesthesia Postprocedure Evaluation (Signed)
Anesthesia Post Note  Patient: Johnathan Murray  Procedure(s) Performed: Anterior Cervical Discectomy and Fusion, Cervical four-five, Cervical five-six (N/A Neck)     Patient location during evaluation: PACU Anesthesia Type: General Level of consciousness: awake and alert Pain management: pain level controlled Vital Signs Assessment: post-procedure vital signs reviewed and stable Respiratory status: spontaneous breathing, nonlabored ventilation, respiratory function stable and patient connected to nasal cannula oxygen Cardiovascular status: blood pressure returned to baseline and stable Postop Assessment: no apparent nausea or vomiting Anesthetic complications: no    Last Vitals:  Vitals:   04/22/19 1528 04/22/19 1609  BP: 125/75 140/86  Pulse: 90 88  Resp: 16 16  Temp: (!) 36.3 C (!) 36.4 C  SpO2: 100% 99%    Last Pain:  Vitals:   04/22/19 1609  TempSrc: Oral  PainSc: 3                  Ever Halberg DAVID

## 2019-04-22 NOTE — Anesthesia Procedure Notes (Signed)
Procedure Name: Intubation Date/Time: 04/22/2019 11:45 AM Performed by: Mariea Clonts, CRNA Pre-anesthesia Checklist: Patient identified, Emergency Drugs available, Suction available and Patient being monitored Patient Re-evaluated:Patient Re-evaluated prior to induction Oxygen Delivery Method: Circle System Utilized Preoxygenation: Pre-oxygenation with 100% oxygen Induction Type: IV induction Ventilation: Mask ventilation without difficulty Laryngoscope Size: Miller and 3 Grade View: Grade I Tube type: Oral Tube size: 7.5 mm Number of attempts: 1 Airway Equipment and Method: Stylet and Oral airway Placement Confirmation: ETT inserted through vocal cords under direct vision,  positive ETCO2 and breath sounds checked- equal and bilateral Tube secured with: Tape Dental Injury: Teeth and Oropharynx as per pre-operative assessment

## 2019-04-22 NOTE — Progress Notes (Signed)
Pt arrived to 3W 09. Alert and oriented x4. Voided successfully. Complains of 3/10 pain related to surgery. Will continue to monitor.

## 2019-04-22 NOTE — Op Note (Signed)
Date of procedure: 04/22/2019  Date of dictation: Same  Service: Neurosurgery  Preoperative diagnosis: C4-5, C5-6 stenosis with myelopathy  Postoperative diagnosis: Same  Procedure Name: C4-5, C5-6 anterior cervical discectomy with interbody fusion utilizing interbody peek cage, local harvested autograft, and anterior plate instrumentation.  Surgeon:Arhianna Ebey A.Raymund Manrique, M.D.  Asst. Surgeon: Reinaldo Meeker, NP  Anesthesia: General  Indication: [age]-year-old male with progressive bilateral upper extremity numbness paresthesias and weakness right worse than left.  Work-up demonstrates evidence of severe spinal stenosis with marked spinal cord compression at C4-5 and C5-6.  Patient presents now for two-level anterior cervical decompression and fusion in hopes of improving his symptoms.  Operative note: After induction anesthesia, patient position supine with neck slightly extended held placed halter traction.  Patient's anterior cervical region prepped and draped sterilely.  Incision made overlying the C5 vertebral body.  Dissection was performed on the right.  Retractor placed.  Fluoroscopy used.  Levels confirmed.  Displaces at both levels in size.  Discectomy was then performed using various instruments down to open the posterior annulus.  Microscope then brought to field use throughout the remainder of the discectomy.  Remaining aspects of annulus and osteophytes removed using high-speed drill.  Posterior logical limb is elevated and resected.  Wide central decompression then performed by undercutting the bodies of C4 and C5.  Decompression then proceeded into each neural foramina.  Wide anterior foraminotomies were performed on the course exiting C5 nerve roots bilaterally.  At this point a very thorough decompression of been achieved.  There was no evidence of injury to thecal sac or nerve roots.  Procedure then repeated at C5-6 in a similar fashion with again without complications.  Wound is then irrigated by  solution.  Gelfoam was placed topically for hemostasis then removed.  6 mm Medtronic anatomic peek cages packed with locally harvested autograft were then impacted in place and recessed slightly from the anterior cortical margin.  42 mm Atlantis anterior cervical plate was then placed over the C4-5 and 6 levels.  This then attached under fluoroscopic guidance and 13 mm variable angle screws to each at all 3 levels.  All screws given final tightening found to be solidly within the bone.  Locking screws engaged in all 3 levels.  Final images reveal good position of the cages and the hardware at the proper upper level with normal alignment of spine.  Wound is then irrigated one final time.  Hemostasis was assured with the bipolar trocar.  Wounds and closed in layers with Vicryl sutures.  Steri-Strips and sterile dressing were applied.  No apparent complications.  Patient tolerated the procedure well and he returns to the recovery room postop.

## 2019-04-22 NOTE — H&P (Signed)
Johnathan Murray is an 68 y.o. male.   Chief Complaint: Weakness HPI: 68 year old male with progressive neck pain and bilateral upper extremity numbness paresthesias and weakness.  Patient with increasing difficulty with fine motor tasks.  Patient with difficulty with his gait.  Known to have significant lumbar stenosis.  Patient is undergone work-up including MRI scan of the cervical spine which demonstrates severe stenosis with severe cord compression at C4-5 and C5-6.  Patient presents now for anterior cervical decompression and fusion at C4-5 and C5-6 in hopes of improving his symptoms.  Past Medical History:  Diagnosis Date  . Abnormal LFTs (liver function tests) 10/31/2016  . Acid reflux 02/15/2018  . Coronary artery disease involving native coronary artery of native heart without angina pectoris 05/30/2016  . Diabetes mellitus due to underlying condition with unspecified complications ([place]) 1/44/3154  . Dyslipidemia 05/30/2016  . Dyspnea    upon exertion  . Essential hypertension 05/30/2016  . Hyperactive gag reflex 02/15/2018  . Hypothyroidism   . Macular degeneration 02/15/2018  . PAF (paroxysmal atrial fibrillation) ([place]) 05/30/2016    Past Surgical History:  Procedure Laterality Date  . AORTIC VALVE REPLACEMENT    . CARDIAC CATHETERIZATION     several stents  . CARDIAC SURGERY    . CORONARY ARTERY BYPASS GRAFT     patient unsure how many, had at [facility]  . HAND SURGERY    . KNEE SURGERY    . LEFT HEART CATH AND CORS/GRAFTS ANGIOGRAPHY N/A 05/16/2018   Procedure: LEFT HEART CATH AND CORS/GRAFTS ANGIOGRAPHY;  Surgeon: Burnell Blanks, MD;  Location: [place] [facility] LAB;  Service: Cardiovascular;  Laterality: N/A;    Family History  Problem Relation Age of Onset  . Prostate cancer Brother   . Heart disease Maternal Grandmother   . Brain cancer Mother    Social History:  reports that he has never smoked. He has never used smokeless tobacco. He  reports current alcohol use of about 2.0 - 3.0 standard drinks of alcohol per week. He reports that he does not use drugs.  Allergies:  Allergies  Allergen Reactions  . Atorvastatin Other (See Comments)    Urinary retention   . Metoprolol Diarrhea  . Nebivolol Diarrhea    Medications Prior to Admission  Medication Sig Dispense Refill  . amLODipine (NORVASC) 10 MG tablet Take 10 mg by mouth daily.     . benazepril (LOTENSIN) 40 MG tablet Take 40 mg by mouth at bedtime.   3  . Cyanocobalamin (B-12) 5000 MCG CAPS Take 5,000 mcg by mouth daily.     . cyclobenzaprine (FLEXERIL) 10 MG tablet Take 1 tablet (10 mg total) by mouth at bedtime. 30 tablet 3  . ezetimibe (ZETIA) 10 MG tablet Take 1 tablet (10 mg total) by mouth daily. 90 tablet 3  . furosemide (LASIX) 40 MG tablet Take 40 mg by mouth daily.  3  . gabapentin (NEURONTIN) 300 MG capsule TAKE 1 CAPSULE BY MOUTH AT BEDTIME FOR 1 WEEK, THEN 1 CAPSULE BY MOUTH TWICE A DAY (Patient taking differently: Take 300 mg by mouth 2 (two) times daily. ) 180 capsule 2  . levothyroxine (SYNTHROID, LEVOTHROID) 50 MCG tablet Take 50 mcg by mouth daily before breakfast.   1  . meloxicam (MOBIC) 7.5 MG tablet Take 7.5 mg by mouth 2 (two) times a day.   2  . metFORMIN (GLUCOPHAGE) 500 MG tablet Take 500 mg by mouth daily.    . nitroGLYCERIN (NITROSTAT) 0.4  MG SL tablet Place 0.4 mg under the tongue every 5 (five) minutes as needed for chest pain.     . vitamin C (ASCORBIC ACID) 500 MG tablet Take 500 mg by mouth daily.     Marland Kitchen aspirin EC 81 MG tablet Take 81 mg by mouth daily.     . sildenafil (VIAGRA) 100 MG tablet 50 mg as needed.    . testosterone cypionate (DEPOTESTOSTERONE CYPIONATE) 200 MG/ML injection Has not been using since 10/2018      Results for orders placed or performed during the hospital encounter of 04/22/19 (from the past 48 hour(s))  Glucose, capillary     Status: Abnormal   Collection Time: 04/22/19  9:09 AM  Result Value Ref Range    Glucose-Capillary 153 (H) 70 - 99 mg/dL   Comment 1 Notify RN    Comment 2 Document in Chart    No results found.  Pertinent items noted in HPI and remainder of comprehensive ROS otherwise negative.  Blood pressure 138/76, pulse 78, temperature 98.4 F (36.9 C), temperature source Oral, resp. rate 20, height 5\' 10"  (1.778 m), weight 83.9 kg, SpO2 100 %.  Patient is awake and alert.  He is oriented and appropriate.  Speech is fluent.  Judgment insight are intact.  Cranial nerve function normal bilateral.  Motor examination reveals moderate weakness of both grips and intrinsic muscles.  Patient with some increased tone in both lower extremities but lower extremity strength grossly normal otherwise.  Sensory examination with patchy distal sensory loss in both upper extremities.  Deep tendon axes are increased in his upper extremities and equivocal in his lower extremities.  No evidence of long track signs.  Gait is somewhat antalgic.  Posture is mildly flexed.  Examination head ears eyes nose throat is normal.  Chest and abdomen are benign.  Extremities are free from injury or deformity. Assessment/Plan C4-5, C5-6 stenosis with myelopathy.  Plan C4-5, C5-6 anterior cervical discectomy with interbody fusion utilizing interbody cage, local harvested autograft, and anterior plate instrumentation.  Risks and benefits of been explained.  Patient wishes to proceed.  Johnathan Murray 04/22/2019, 11:05 AM

## 2019-04-22 NOTE — Transfer of Care (Signed)
Immediate Anesthesia Transfer of Care Note  Patient: Johnathan Murray  Procedure(s) Performed: Anterior Cervical Discectomy and Fusion, Cervical four-five, Cervical five-six (N/A Neck)  Patient Location: PACU  Anesthesia Type:General  Level of Consciousness: awake, alert  and oriented  Airway & Oxygen Therapy: Patient Spontanous Breathing and Patient connected to nasal cannula oxygen  Post-op Assessment: Report given to RN, Post -op Vital signs reviewed and stable and Patient moving all extremities X 4  Post vital signs: Reviewed and stable  Last Vitals:  Vitals Value Taken Time  BP 146/80 04/22/2019  1:55 PM  Temp 36.3 C 04/22/2019  1:55 PM  Pulse 85 04/22/2019  1:56 PM  Resp 15 04/22/2019  1:56 PM  SpO2 100 % 04/22/2019  1:56 PM  Vitals shown include unvalidated device data.  Last Pain:  Vitals:   04/22/19 0940  TempSrc:   PainSc: 0-No pain      Patients Stated Pain Goal: 2 (01/00/71 2197)  Complications: No apparent anesthesia complications

## 2019-04-22 NOTE — Progress Notes (Signed)
Orthopedic Tech Progress Note Patient Details:  Johnathan Murray September 16, 1951 [id number] PACU RN called requesting soft collar, but I let her know he was a pre fit before surgery. Patient ID: Johnathan Murray, male   DOB: 1951/07/09, 68 y.o.   MRN: [id number]   [name] 04/22/2019, 2:11 PM

## 2019-04-22 NOTE — Brief Op Note (Signed)
04/22/2019  1:42 PM  PATIENT:  Johnathan Murray  68 y.o. male  PRE-OPERATIVE DIAGNOSIS:  Stenosis  POST-OPERATIVE DIAGNOSIS:  Stenosis  PROCEDURE:  Procedure(s): Anterior Cervical Discectomy and Fusion, Cervical four-five, Cervical five-six (N/A)  SURGEON:  Surgeon(s) and Role:    * Earnie Larsson, MD - Primary  PHYSICIAN ASSISTANT:   ASSISTANTSReinaldo Meeker, NP   ANESTHESIA:   general  EBL:  150 cc   BLOOD ADMINISTERED:none  DRAINS: none   LOCAL MEDICATIONS USED:  NONE  SPECIMEN:  No Specimen  DISPOSITION OF SPECIMEN:  N/A  COUNTS:  YES  TOURNIQUET:  * No tourniquets in log *  DICTATION: .Dragon Dictation  PLAN OF CARE: Admit to inpatient   PATIENT DISPOSITION:  PACU - hemodynamically stable.   Delay start of Pharmacological VTE agent (>24hrs) due to surgical blood loss or risk of bleeding: yes

## 2019-04-23 LAB — GLUCOSE, CAPILLARY: Glucose-Capillary: 136 mg/dL — ABNORMAL HIGH (ref 70–99)

## 2019-04-23 MED ORDER — HYDROCODONE-ACETAMINOPHEN 5-325 MG PO TABS: 1.0000 | ORAL_TABLET | ORAL | 0 refills | Status: DC | PRN

## 2019-04-23 MED ORDER — CYCLOBENZAPRINE HCL 10 MG PO TABS: 10.0000 mg | ORAL_TABLET | Freq: Three times a day (TID) | ORAL | 0 refills | Status: DC | PRN

## 2019-04-23 MED FILL — Thrombin (Recombinant) For Soln 5000 Unit: CUTANEOUS | Qty: 5000 | Status: AC

## 2019-04-23 NOTE — TOC Transition Note (Signed)
Transition of Care [facility]) - CM/SW Discharge Note   Patient Details  Name: Johnathan Murray MRN: [id number] Date of Birth: [birth year]  [facility]) CM/SW Contact:  Pollie Friar, RN Phone Number: 04/23/2019, 11:19 AM   Clinical Narrative:    Pt discharging home with self care. No f/u per PT/OT and no DME needs.  Pt has transportation home.   Final next level of care: Home/Self Care Barriers to Discharge: No Barriers Identified   Patient Goals and CMS Choice        Discharge Placement                       Discharge Plan and Services                                     Social Determinants of Health (SDOH) Interventions     Readmission Risk Interventions No flowsheet data found.

## 2019-04-23 NOTE — Discharge Summary (Signed)
Physician Discharge Summary  Patient ID: Johnathan Murray MRN: [id number] DOB/AGE: [birth year] 68 y.o.  Admit date: 04/22/2019 Discharge date: 04/23/2019  Admission Diagnoses:  Discharge Diagnoses:  Active Problems:   Cervical myelopathy [facility])   Discharged Condition: good  Hospital Course: Patient admitted to the hospital where he underwent uncomplicated two-level anterior cervical decompression and fusion.  Postoperatively doing very well.  Much improved motor and sensory function in both upper extremities.  No pain.  Ambulating well.  Voiding well.  Ready for discharge home.  Consults:   Significant Diagnostic Studies:   Treatments:   Discharge Exam: Blood pressure (!) 150/75, pulse 75, temperature 97.8 F (36.6 C), temperature source Oral, resp. rate 15, height 5\' 10"  (1.778 m), weight 83.9 kg, SpO2 100 %. Awake and alert.  Oriented and appropriate.  Cranial nerve function intact.  Motor and sensory function of the extremities with intact motor strength bilateral upper extremities and lower extremities.  Wound clean and dry.  Chest and abdomen benign.  Neck soft.  Wound clean and dry.   Disposition: Discharge disposition: 01-Home or Self Care        Allergies as of 04/23/2019      Reactions   Atorvastatin Other (See Comments)   Urinary retention    Metoprolol Diarrhea   Nebivolol Diarrhea      Medication List    TAKE these medications   amLODipine 10 MG tablet Commonly known as:  NORVASC Take 10 mg by mouth daily.   aspirin EC 81 MG tablet Take 81 mg by mouth daily.   B-12 5000 MCG Caps Take 5,000 mcg by mouth daily.   benazepril 40 MG tablet Commonly known as:  LOTENSIN Take 40 mg by mouth at bedtime.   cyclobenzaprine 10 MG tablet Commonly known as:  FLEXERIL Take 1 tablet (10 mg total) by mouth at bedtime. What changed:  Another medication with the same name was added. Make sure you understand how and when to take each.   cyclobenzaprine 10 MG  tablet Commonly known as:  FLEXERIL Take 1 tablet (10 mg total) by mouth 3 (three) times daily as needed for muscle spasms. What changed:  You were already taking a medication with the same name, and this prescription was added. Make sure you understand how and when to take each.   ezetimibe 10 MG tablet Commonly known as:  ZETIA Take 1 tablet (10 mg total) by mouth daily.   furosemide 40 MG tablet Commonly known as:  LASIX Take 40 mg by mouth daily.   gabapentin 300 MG capsule Commonly known as:  NEURONTIN TAKE 1 CAPSULE BY MOUTH AT BEDTIME FOR 1 WEEK, THEN 1 CAPSULE BY MOUTH TWICE A DAY What changed:    how much to take  how to take this  when to take this  additional instructions   HYDROcodone-acetaminophen 5-325 MG tablet Commonly known as:  NORCO/VICODIN Take 1 tablet by mouth every 4 (four) hours as needed for moderate pain ((score 4 to 6)).   levothyroxine 50 MCG tablet Commonly known as:  SYNTHROID Take 50 mcg by mouth daily before breakfast.   meloxicam 7.5 MG tablet Commonly known as:  MOBIC Take 7.5 mg by mouth 2 (two) times a day.   metFORMIN 500 MG tablet Commonly known as:  GLUCOPHAGE Take 500 mg by mouth daily.   nitroGLYCERIN 0.4 MG SL tablet Commonly known as:  NITROSTAT Place 0.4 mg under the tongue every 5 (five) minutes as needed for chest pain.   sildenafil  100 MG tablet Commonly known as:  VIAGRA 50 mg as needed.   testosterone cypionate 200 MG/ML injection Commonly known as:  DEPOTESTOSTERONE CYPIONATE Has not been using since 10/2018   vitamin C 500 MG tablet Commonly known as:  ASCORBIC ACID Take 500 mg by mouth daily.        Signed: Cooper Render Emileigh Kellett 04/23/2019, 10:30 AM

## 2019-04-23 NOTE — Evaluation (Signed)
Occupational Therapy Evaluation and DISCHARGE  Patient Details Name: Johnathan Murray MRN: [id number] DOB: [birth year] Today's Date: 04/23/2019    History of Present Illness Pt is a 68 y/o male s/p C4-5, C5-6 ACDF.  PMH: DM, CAD, macular degeneration, HTN, CABG.     Clinical Impression   PTA patient independent and driving. Admitted for above and limited by problem list below, including precautions, pain.  Patient educated on precautions, brace mgmt and wear schedule, ADL compensatory techniques, recommendations, and safety.  He demonstrates ability to complete transfers with supervision, in room mobility with supervision, and UB/LB ADls with supervision (using figure 4 technique for LB self care).  Pt reports spouse with provide 24/7 support initially for the first week.  He is progressing well, reports some decreased sensation in R UE but significantly improved from before surgery. Based on performance today, no further OT needs have been identified and OT will sign off.  Thank you for this referral!     Follow Up Recommendations  No OT follow up;Supervision - Intermittent    Equipment Recommendations  None recommended by OT    Recommendations for Other Services       Precautions / Restrictions Precautions Precautions: Cervical Precaution Booklet Issued: Yes (comment) Precaution Comments: reviewed precautions with pt  Required Braces or Orthoses: Cervical Brace Cervical Brace: Soft collar Restrictions Weight Bearing Restrictions: No      Mobility Bed Mobility               General bed mobility comments: seated EOB upon entry  Transfers Overall transfer level: Needs assistance Equipment used: None Transfers: Sit to/from Stand Sit to Stand: Supervision         General transfer comment: supervision for safety, no assist required    Balance Overall balance assessment: Mild deficits observed, not formally tested                                         ADL either performed or assessed with clinical judgement   ADL Overall ADL's : Needs assistance/impaired     Grooming: Supervision/safety;Standing Grooming Details (indicate cue type and reason): reviewed compensatory techniques for cervical precautions  Upper Body Bathing: Sitting;Supervision/ safety   Lower Body Bathing: Supervison/ safety;Sit to/from stand Lower Body Bathing Details (indicate cue type and reason): able to complete figure 4 technique, sit to stand supervision  Upper Body Dressing : Supervision/safety;Sitting Upper Body Dressing Details (indicate cue type and reason): reviewed compensastory techniques and clothing options  Lower Body Dressing: Supervision/safety;Sit to/from stand Lower Body Dressing Details (indicate cue type and reason): able to complete figure 4 technique, reviewed compensatory techniques, supervision sit<>Stand  Toilet Transfer: Supervision/safety;Ambulation   Toileting- Clothing Manipulation and Hygiene: Sit to/from stand;Modified independent   Tub/ Shower Transfer: Walk-in shower;Supervision/safety;Ambulation;Shower Scientist, research (medical) Details (indicate cue type and reason): supervision for safety, reviewed UE support stepping over threshold Functional mobility during ADLs: Supervision/safety General ADL Comments: pt educated on precautions, ADl compenstory techniques and recommendations     Vision Baseline Vision/History: Wears glasses Wears Glasses: Reading only Patient Visual Report: No change from baseline Vision Assessment?: No apparent visual deficits     Perception     Praxis      Pertinent Vitals/Pain Pain Assessment: Faces Faces Pain Scale: Hurts a little bit Pain Location: neck incision Pain Descriptors / Indicators: Discomfort;Operative site guarding Pain Intervention(s): Monitored during session  Hand Dominance Right   Extremity/Trunk Assessment Upper Extremity Assessment Upper Extremity Assessment:  Generalized weakness(to 90 FF, R UE slightly dulled sensation but improving )   Lower Extremity Assessment Lower Extremity Assessment: Defer to PT evaluation   Cervical / Trunk Assessment Cervical / Trunk Assessment: Other exceptions Cervical / Trunk Exceptions: s/p cervical sx   Communication Communication Communication: No difficulties   Cognition Arousal/Alertness: Awake/alert Behavior During Therapy: WFL for tasks assessed/performed Overall Cognitive Status: Within Functional Limits for tasks assessed                                     General Comments       Exercises     Shoulder Instructions      Home Living Family/patient expects to be discharged to:: Private residence Living Arrangements: Spouse/significant other Available Help at Discharge: Family;Available 24 hours/day(initially for first week ) Type of Home: House Home Access: Stairs to enter [employer] of Steps: 8 Entrance Stairs-Rails: Can reach both Home Layout: One level     Bathroom Shower/Tub: Occupational psychologist: Standard     Home Equipment: Grab bars - tub/shower;Shower seat - built in          Prior Functioning/Environment Level of Independence: Independent with assistive device(s)        Comments: used cane for community mobility, independnet ADLs, driving         OT Problem List: Impaired balance (sitting and/or standing);Decreased knowledge of use of DME or AE;Decreased knowledge of precautions;Pain;Impaired sensation      OT Treatment/Interventions:      OT Goals(Current goals can be found in the care plan section) Acute Rehab OT Goals Patient Stated Goal: to get home today OT Goal Formulation: With patient  OT Frequency:     Barriers to D/C:            Co-evaluation              AM-PAC OT "6 Clicks" Daily Activity     Outcome Measure Help from another person eating meals?: None Help from another person taking care of  personal grooming?: None Help from another person toileting, which includes using toliet, bedpan, or urinal?: None Help from another person bathing (including washing, rinsing, drying)?: None Help from another person to put on and taking off regular upper body clothing?: None Help from another person to put on and taking off regular lower body clothing?: None 6 Click Score: 24   End of Session Equipment Utilized During Treatment: Cervical collar Nurse Communication: Mobility status;Precautions  Activity Tolerance: Patient tolerated treatment well Patient left: with call bell/phone within reach;with bed alarm set;Other (comment)(seated EOB)  OT Visit Diagnosis: Other abnormalities of gait and mobility (R26.89);Pain Pain - part of body: (incisional-neck)                Time: 3646-8032 OT Time Calculation (min): 21 min Charges:  OT General Charges $OT Visit: 1 Visit OT Evaluation $OT Eval Low Complexity: 1 Low  Delight Stare, OT Acute Rehabilitation Services Pager [phone] Office [phone]   Delight Stare 04/23/2019, 9:08 AM

## 2019-04-23 NOTE — Evaluation (Addendum)
Physical Therapy Evaluation Patient Details Name: Johnathan Murray MRN: [id number] DOB: Jul 15, 1951 Today's Date: 04/23/2019   History of Present Illness  Pt is a 68 y/o male s/p C4-5, C5-6 ACDF.  PMH: DM, CAD, macular degeneration, HTN, CABG.    Clinical Impression  Patient appears to be at his baseline level of functioning . He ambulated 150' without an AD. He had no syncope or dyspnea. He reports a significant improvement in ability to walk post op. He has no need for skilled avcute therapy at this time. He will likely not require any follow up therapy upon discharge.      Follow Up Recommendations Follow surgeon's recommendation for DC plan and follow-up therapies    Equipment Recommendations  None recommended by PT    Recommendations for Other Services       Precautions / Restrictions Precautions Precautions: Cervical Precaution Booklet Issued: Yes (comment) Precaution Comments: re-enforced percuations  Required Braces or Orthoses: Cervical Brace Cervical Brace: Soft collar Restrictions Weight Bearing Restrictions: No      Mobility  Bed Mobility               General bed mobility comments: seated EOB upon entry with nursing. Per nursing patient indepdnent  Transfers Overall transfer level: Needs assistance Equipment used: None Transfers: Sit to/from Stand Sit to Stand: Supervision;Independent         General transfer comment: supervision for balance on first trial but patient stood up 2 more times indepednently   Ambulation/Gait Ambulation/Gait assistance: Independent Gait Distance (Feet): 150 Feet Assistive device: None   Gait velocity: decreased  Gait velocity interpretation: <1.31 ft/sec, indicative of household ambulator General Gait Details: walked with slightly flexed head and flexed trunk   Stairs            Wheelchair Mobility    Modified Rankin (Stroke Patients Only)       Balance Overall balance assessment: Independent(No LOB  observed in any position )                                           Pertinent Vitals/Pain Pain Assessment: Faces Faces Pain Scale: Hurts a little bit Pain Location: neck incision Pain Descriptors / Indicators: Discomfort;Operative site guarding Pain Intervention(s): Monitored during session    Home Living Family/patient expects to be discharged to:: Private residence Living Arrangements: Spouse/significant other Available Help at Discharge: Family;Available 24 hours/day(initially for first week ) Type of Home: House Home Access: Stairs to enter Entrance Stairs-Rails: Can reach both Entrance Stairs-Number of Steps: 8 Home Layout: One level Home Equipment: Grab bars - tub/shower;Shower seat - built in      Prior Function Level of Independence: Independent with assistive device(s)         Comments: used cane for community mobility, independnet ADLs, driving      Hand Dominance   Dominant Hand: Right    Extremity/Trunk Assessment   Upper Extremity Assessment Upper Extremity Assessment: Generalized weakness    Lower Extremity Assessment Lower Extremity Assessment: Defer to PT evaluation    Cervical / Trunk Assessment Cervical / Trunk Assessment: Other exceptions Cervical / Trunk Exceptions: s/p cervical sx  Communication   Communication: No difficulties  Cognition Arousal/Alertness: Awake/alert Behavior During Therapy: WFL for tasks assessed/performed Overall Cognitive Status: Within Functional Limits for tasks assessed  General Comments General comments (skin integrity, edema, etc.): wearing C-collar    Exercises     Assessment/Plan    PT Assessment Patent does not need any further PT services  PT Problem List Decreased activity tolerance;Decreased balance       PT Treatment Interventions      PT Goals (Current goals can be found in the Care Plan section)  Acute Rehab PT  Goals Patient Stated Goal: to go home PT Goal Formulation: With patient    Frequency     Barriers to discharge        Co-evaluation               AM-PAC PT "6 Clicks" Mobility  Outcome Measure Help needed turning from your back to your side while in a flat bed without using bedrails?: None Help needed moving from lying on your back to sitting on the side of a flat bed without using bedrails?: None Help needed moving to and from a bed to a chair (including a wheelchair)?: None Help needed standing up from a chair using your arms (e.g., wheelchair or bedside chair)?: None Help needed to walk in hospital room?: A Little Help needed climbing 3-5 steps with a railing? : A Little 6 Click Score: 22    End of Session Equipment Utilized During Treatment: Gait belt Activity Tolerance: Patient tolerated treatment well Patient left: in bed Nurse Communication: Mobility status PT Visit Diagnosis: Other abnormalities of gait and mobility (R26.89)    Time: 3664-4034 PT Time Calculation (min) (ACUTE ONLY): 25 min   Charges:   PT Evaluation $PT Eval Low Complexity: 1 Low            Carney Living PT DPT  04/23/2019, 9:42 AM

## 2019-04-23 NOTE — Discharge Instructions (Signed)

## 2019-04-24 ENCOUNTER — Encounter (HOSPITAL_COMMUNITY): Payer: Self-pay | Admitting: Neurosurgery

## 2019-05-10 DIAGNOSIS — E291 Testicular hypofunction: Secondary | ICD-10-CM | POA: Diagnosis not present

## 2019-05-10 DIAGNOSIS — I10 Essential (primary) hypertension: Secondary | ICD-10-CM | POA: Diagnosis not present

## 2019-05-10 DIAGNOSIS — R809 Proteinuria, unspecified: Secondary | ICD-10-CM | POA: Diagnosis not present

## 2019-05-10 DIAGNOSIS — E1129 Type 2 diabetes mellitus with other diabetic kidney complication: Secondary | ICD-10-CM | POA: Diagnosis not present

## 2019-05-10 DIAGNOSIS — D509 Iron deficiency anemia, unspecified: Secondary | ICD-10-CM | POA: Diagnosis not present

## 2019-05-10 DIAGNOSIS — M1712 Unilateral primary osteoarthritis, left knee: Secondary | ICD-10-CM | POA: Diagnosis not present

## 2019-05-10 DIAGNOSIS — Z125 Encounter for screening for malignant neoplasm of prostate: Secondary | ICD-10-CM | POA: Diagnosis not present

## 2019-05-10 DIAGNOSIS — Z1331 Encounter for screening for depression: Secondary | ICD-10-CM | POA: Diagnosis not present

## 2019-05-10 DIAGNOSIS — E785 Hyperlipidemia, unspecified: Secondary | ICD-10-CM | POA: Diagnosis not present

## 2019-05-11 ENCOUNTER — Other Ambulatory Visit: Payer: Self-pay | Admitting: Cardiology

## 2019-05-13 NOTE — Telephone Encounter (Signed)
Zetia refilled per request

## 2019-06-04 DIAGNOSIS — M4802 Spinal stenosis, cervical region: Secondary | ICD-10-CM | POA: Diagnosis not present

## 2019-06-04 DIAGNOSIS — M48062 Spinal stenosis, lumbar region with neurogenic claudication: Secondary | ICD-10-CM | POA: Diagnosis not present

## 2019-06-05 ENCOUNTER — Other Ambulatory Visit: Payer: Self-pay | Admitting: Neurosurgery

## 2019-06-06 ENCOUNTER — Encounter (HOSPITAL_COMMUNITY)
Admission: RE | Admit: 2019-06-06 | Discharge: 2019-06-06 | Disposition: A | Discharge status: Home / Self Care | Payer: BC Managed Care – PPO | Source: Ambulatory Visit | Attending: Neurosurgery | Admitting: Neurosurgery

## 2019-06-06 ENCOUNTER — Other Ambulatory Visit: Payer: Self-pay

## 2019-06-06 ENCOUNTER — Inpatient Hospital Stay (HOSPITAL_COMMUNITY): Admission: RE | Admit: 2019-06-06 | Payer: BC Managed Care – PPO | Source: Ambulatory Visit

## 2019-06-06 ENCOUNTER — Encounter (HOSPITAL_COMMUNITY): Payer: Self-pay

## 2019-06-06 DIAGNOSIS — Z01812 Encounter for preprocedural laboratory examination: Secondary | ICD-10-CM | POA: Diagnosis not present

## 2019-06-06 HISTORY — DX: Cardiac murmur, unspecified: R01.1

## 2019-06-06 HISTORY — DX: Anemia, unspecified: D64.9

## 2019-06-06 LAB — CBC
HCT: 38.7 % — ABNORMAL LOW (ref 39.0–52.0)
Hemoglobin: 12.8 g/dL — ABNORMAL LOW (ref 13.0–17.0)
MCH: 33.1 pg (ref 26.0–34.0)
MCHC: 33.1 g/dL (ref 30.0–36.0)
MCV: 100 fL (ref 80.0–100.0)
Platelets: 233 10*3/uL (ref 150–400)
RBC: 3.87 MIL/uL — ABNORMAL LOW (ref 4.22–5.81)
RDW: 14.5 % (ref 11.5–15.5)
WBC: 4.7 10*3/uL (ref 4.0–10.5)
nRBC: 0 % (ref 0.0–0.2)

## 2019-06-06 LAB — BASIC METABOLIC PANEL
Anion gap: 12 (ref 5–15)
BUN: 11 mg/dL (ref 8–23)
CO2: 22 mmol/L (ref 22–32)
Calcium: 9.4 mg/dL (ref 8.9–10.3)
Chloride: 103 mmol/L (ref 98–111)
Creatinine, Ser: 1.54 mg/dL — ABNORMAL HIGH (ref 0.61–1.24)
GFR calc Af Amer: 53 mL/min — ABNORMAL LOW (ref >60)
GFR calc non Af Amer: 46 mL/min — ABNORMAL LOW (ref >60)
Glucose, Bld: 135 mg/dL — ABNORMAL HIGH (ref 70–99)
Potassium: 3.7 mmol/L (ref 3.5–5.1)
Sodium: 137 mmol/L (ref 135–145)

## 2019-06-06 LAB — SURGICAL PCR SCREEN
MRSA, PCR: NEGATIVE
Staphylococcus aureus: POSITIVE — AB

## 2019-06-06 LAB — GLUCOSE, CAPILLARY: Glucose-Capillary: 149 mg/dL — ABNORMAL HIGH (ref 70–99)

## 2019-06-06 NOTE — Progress Notes (Signed)
PCP - Dr. Ilda Basset Cardiologist - Dr. Geraldo Pitter  Chest x-ray - N/A EKG - 10/31/18 Stress Test - 05/01/18 ECHO - 04/25/18 Cardiac Cath - 05/16/18  Sleep Study - N/A CPAP - N/A  Fasting Blood Sugar - does not check his blood sugars Checks Blood Sugar ___0__ times a day A1C - 6.0 on 04/18/19  Blood Thinner Instructions: N/A Aspirin Instructions: Last dose 06/04/19  Anesthesia review: No - had surgery in June and was reviewed at that time.    Patient denies shortness of breath, fever, cough and chest pain at PAT appointment   Patient verbalized understanding of instructions that were given to them at the PAT appointment. Patient was also instructed that they will need to review over the PAT instructions again at home before surgery.  Patient has appt for Covid 19 testing tomorrow. Pt was instructed on self quarantine and voiced understanding.   Coronavirus Screening  Have you experienced the following symptoms:  Cough NO Fever (>100.6F)  NO Runny nose NO Sore throat NO Difficulty breathing/shortness of breath NO  Have you or a family member traveled in the last 14 days and where? NO    Patient reminded that hospital visitation restrictions are in effect and the importance of the restrictions.

## 2019-06-06 NOTE — Pre-Procedure Instructions (Signed)
Melvern Ramone Tant  06/06/2019    Your procedure is scheduled on Tuesday, June 11, 2019 at 10:35 AM.   Report to [facility] Entrance "A" Admitting Office at 8:30 AM.   Call this number if you have problems the morning of surgery: [phone]   Questions prior to day of surgery, please call [phone] between 8 & 4 PM.   Remember:  Do not eat or drink after midnight Monday, 06/10/19  Take these medicines the morning of surgery with A SIP OF WATER: Amlodipine (Norvasc), Levothyroxine (Synthroid), Gabapentin (Neurontin) - if needed, Cyclobenzaprine (Flexeril) - if needed.  Do not take Metformin day of surgery.  Stop Aspirin as instructed by your cardiologist/surgeon. Stop Mobic as of today prior to surgery. Do not use any other NSAIDS (Ibuprofen, Naprosyn, etc), Aspirin products (Goody's, BC Powders, etc) or Herbal medications prior to surgery.   How to Manage Your Diabetes Before Surgery   Why is it important to control my blood sugar before and after surgery?   Improving blood sugar levels before and after surgery helps healing and can limit problems.  A way of improving blood sugar control is eating a healthy diet by:  - Eating less sugar and carbohydrates  - Increasing activity/exercise  - Talk with your doctor about reaching your blood sugar goals  High blood sugars (greater than 180 mg/dL) can raise your risk of infections and slow down your recovery so you will need to focus on controlling your diabetes during the weeks before surgery.  Make sure that the doctor who takes care of your diabetes knows about your planned surgery including the date and location.  How do I manage my blood sugars before surgery?   Check your blood sugar at least 4 times a day, 2 days before surgery to make sure that they are not too high or low.  Check your blood sugar the morning of your surgery when you wake up and every 2 hours until you get to the Short-Stay unit.  Treat a  low blood sugar (less than 70 mg/dL) with 1/2 cup of clear juice (cranberry or apple), 4 glucose tablets, OR glucose gel.  Recheck blood sugar in 15 minutes after treatment (to make sure it is greater than 70 mg/dL).  If blood sugar is not greater than 70 mg/dL on re-check, call [phone] for further instructions.   Report your blood sugar to the Short-Stay nurse when you get to Short-Stay.  References:  [facility], 2007 "How to Manage your Diabetes Before and After Surgery".    Do not wear jewelry.  Do not wear lotions, powders, cologne or deodorant.  Men may shave face and neck.  Do not bring valuables to the hospital.  [facility] is not responsible for any belongings or valuables.  Contacts, dentures or bridgework may not be worn into surgery.  Leave your suitcase in the car.  After surgery it may be brought to your room.  For patients admitted to the hospital, discharge time will be determined by your treatment team.  Patients discharged the day of surgery will not be allowed to drive home.   [place] - Preparing for Surgery  Before surgery, you can play an important role.  Because skin is not sterile, your skin needs to be as free of germs as possible.  You can reduce the number of germs on you skin by washing with CHG (chlorahexidine gluconate) soap before surgery.  CHG is an antiseptic cleaner which  kills germs and bonds with the skin to continue killing germs even after washing.  Oral Hygiene is also important in reducing the risk of infection.  Remember to brush your teeth with your regular toothpaste the morning of surgery.  Please DO NOT use if you have an allergy to CHG or antibacterial soaps.  If your skin becomes reddened/irritated stop using the CHG and inform your nurse when you arrive at Short Stay.  Do not shave (including legs and underarms) for at least 48 hours prior to the first CHG shower.  You may shave your face.  Please follow  these instructions carefully:   1.  Shower with CHG Soap the night before surgery and the morning of Surgery.  2.  If you choose to wash your hair, wash your hair first as usual with your normal shampoo.  3.  After you shampoo, rinse your hair and body thoroughly to remove the shampoo. 4.  Use CHG as you would any other liquid soap.  You can apply chg directly to the skin and wash gently with a      scrungie or washcloth.           5.  Apply the CHG Soap to your body ONLY FROM THE NECK DOWN.   Do not use on open wounds or open sores. Avoid contact with your eyes, ears, mouth and genitals (private parts).  Wash genitals (private parts) with your normal soap.  6.  Wash thoroughly, paying special attention to the area where your surgery will be performed.  7.  Thoroughly rinse your body with warm water from the neck down.  8.  DO NOT shower/wash with your normal soap after using and rinsing off the CHG Soap.  9.  Pat yourself dry with a clean towel.            10.  Wear clean pajamas.            11.  Place clean sheets on your bed the night of your first shower and do not sleep with pets.  Day of Surgery  Shower as above. Do not apply any lotions/deodorants the morning of surgery.   Please wear clean clothes to the hospital. Remember to brush your teeth with toothpaste.   Please read over the fact sheets that you were given.

## 2019-06-06 NOTE — Progress Notes (Signed)
Mupirocin Ointment Rx called into [facility] in [place] for positive PCR of Staph. Pt notified of results and need to pick up Rx and start it tonight. Pt voiced understanding

## 2019-06-07 ENCOUNTER — Other Ambulatory Visit (HOSPITAL_COMMUNITY)
Admission: RE | Admit: 2019-06-07 | Discharge: 2019-06-07 | Disposition: A | Discharge status: Home / Self Care | Payer: BC Managed Care – PPO | Source: Ambulatory Visit | Attending: Neurosurgery | Admitting: Neurosurgery

## 2019-06-07 DIAGNOSIS — Z1159 Encounter for screening for other viral diseases: Secondary | ICD-10-CM | POA: Insufficient documentation

## 2019-06-07 LAB — SARS CORONAVIRUS 2 (TAT 6-24 HRS): SARS Coronavirus 2: NEGATIVE

## 2019-06-11 ENCOUNTER — Observation Stay (HOSPITAL_COMMUNITY)
Admission: RE | Admit: 2019-06-11 | Discharge: 2019-06-11 | Disposition: A | Discharge status: Home / Self Care | Payer: BC Managed Care – PPO | Attending: Neurosurgery | Admitting: Neurosurgery

## 2019-06-11 ENCOUNTER — Ambulatory Visit (HOSPITAL_COMMUNITY): Payer: BC Managed Care – PPO | Admitting: Physician Assistant

## 2019-06-11 ENCOUNTER — Ambulatory Visit (HOSPITAL_COMMUNITY): Payer: BC Managed Care – PPO | Admitting: Certified Registered Nurse Anesthetist

## 2019-06-11 ENCOUNTER — Encounter (HOSPITAL_COMMUNITY): Payer: Self-pay

## 2019-06-11 ENCOUNTER — Encounter (HOSPITAL_COMMUNITY)
Admission: RE | Disposition: A | Discharge status: Home / Self Care | Payer: Self-pay | Source: Home / Self Care | Attending: Neurosurgery

## 2019-06-11 ENCOUNTER — Other Ambulatory Visit: Payer: Self-pay

## 2019-06-11 ENCOUNTER — Ambulatory Visit (HOSPITAL_COMMUNITY): Payer: BC Managed Care – PPO

## 2019-06-11 DIAGNOSIS — Z952 Presence of prosthetic heart valve: Secondary | ICD-10-CM | POA: Insufficient documentation

## 2019-06-11 DIAGNOSIS — E119 Type 2 diabetes mellitus without complications: Secondary | ICD-10-CM | POA: Diagnosis not present

## 2019-06-11 DIAGNOSIS — Z7984 Long term (current) use of oral hypoglycemic drugs: Secondary | ICD-10-CM | POA: Diagnosis not present

## 2019-06-11 DIAGNOSIS — E785 Hyperlipidemia, unspecified: Secondary | ICD-10-CM | POA: Insufficient documentation

## 2019-06-11 DIAGNOSIS — Z888 Allergy status to other drugs, medicaments and biological substances status: Secondary | ICD-10-CM | POA: Diagnosis not present

## 2019-06-11 DIAGNOSIS — Z951 Presence of aortocoronary bypass graft: Secondary | ICD-10-CM | POA: Diagnosis not present

## 2019-06-11 DIAGNOSIS — M5126 Other intervertebral disc displacement, lumbar region: Secondary | ICD-10-CM | POA: Diagnosis not present

## 2019-06-11 DIAGNOSIS — M48062 Spinal stenosis, lumbar region with neurogenic claudication: Principal | ICD-10-CM | POA: Diagnosis present

## 2019-06-11 DIAGNOSIS — Z79890 Hormone replacement therapy (postmenopausal): Secondary | ICD-10-CM | POA: Insufficient documentation

## 2019-06-11 DIAGNOSIS — E039 Hypothyroidism, unspecified: Secondary | ICD-10-CM | POA: Diagnosis not present

## 2019-06-11 DIAGNOSIS — Z7982 Long term (current) use of aspirin: Secondary | ICD-10-CM | POA: Insufficient documentation

## 2019-06-11 DIAGNOSIS — I480 Paroxysmal atrial fibrillation: Secondary | ICD-10-CM | POA: Diagnosis not present

## 2019-06-11 DIAGNOSIS — I10 Essential (primary) hypertension: Secondary | ICD-10-CM | POA: Diagnosis not present

## 2019-06-11 DIAGNOSIS — M1990 Unspecified osteoarthritis, unspecified site: Secondary | ICD-10-CM | POA: Insufficient documentation

## 2019-06-11 DIAGNOSIS — Z791 Long term (current) use of non-steroidal anti-inflammatories (NSAID): Secondary | ICD-10-CM | POA: Insufficient documentation

## 2019-06-11 DIAGNOSIS — Z419 Encounter for procedure for purposes other than remedying health state, unspecified: Secondary | ICD-10-CM

## 2019-06-11 DIAGNOSIS — Z79899 Other long term (current) drug therapy: Secondary | ICD-10-CM | POA: Insufficient documentation

## 2019-06-11 DIAGNOSIS — I2510 Atherosclerotic heart disease of native coronary artery without angina pectoris: Secondary | ICD-10-CM | POA: Insufficient documentation

## 2019-06-11 HISTORY — DX: Spinal stenosis, lumbar region with neurogenic claudication: M48.062

## 2019-06-11 HISTORY — PX: LUMBAR LAMINECTOMY/DECOMPRESSION MICRODISCECTOMY: SHX5026

## 2019-06-11 LAB — CBC
HCT: 40 % (ref 39.0–52.0)
Hemoglobin: 13.6 g/dL (ref 13.0–17.0)
MCH: 33.8 pg (ref 26.0–34.0)
MCHC: 34 g/dL (ref 30.0–36.0)
MCV: 99.5 fL (ref 80.0–100.0)
Platelets: 228 10*3/uL (ref 150–400)
RBC: 4.02 MIL/uL — ABNORMAL LOW (ref 4.22–5.81)
RDW: 14.6 % (ref 11.5–15.5)
WBC: 10.5 10*3/uL (ref 4.0–10.5)
nRBC: 0 % (ref 0.0–0.2)

## 2019-06-11 LAB — GLUCOSE, CAPILLARY
Glucose-Capillary: 165 mg/dL — ABNORMAL HIGH (ref 70–99)
Glucose-Capillary: 161 mg/dL — ABNORMAL HIGH (ref 70–99)
Glucose-Capillary: 381 mg/dL — ABNORMAL HIGH (ref 70–99)

## 2019-06-11 LAB — CREATININE, SERUM
Creatinine, Ser: 1.63 mg/dL — ABNORMAL HIGH (ref 0.61–1.24)
GFR calc Af Amer: 50 mL/min — ABNORMAL LOW (ref >60)
GFR calc non Af Amer: 43 mL/min — ABNORMAL LOW (ref >60)

## 2019-06-11 SURGERY — LUMBAR LAMINECTOMY/DECOMPRESSION MICRODISCECTOMY 1 LEVEL
Anesthesia: General | Site: Spine Lumbar | Laterality: Bilateral

## 2019-06-11 MED ORDER — DEXAMETHASONE SODIUM PHOSPHATE 10 MG/ML IJ SOLN
INTRAMUSCULAR | Status: DC | PRN
  Administered 2019-06-11: 10 mg via INTRAVENOUS

## 2019-06-11 MED ORDER — HYDROCODONE-ACETAMINOPHEN 5-325 MG PO TABS
1.0000 | ORAL_TABLET | ORAL | Status: DC | PRN
  Administered 2019-06-11: 13:00:00 1 via ORAL

## 2019-06-11 MED ORDER — SODIUM CHLORIDE 0.9 % IV SOLN
INTRAVENOUS | Status: DC | PRN
  Administered 2019-06-11: 12:00:00 500 mL

## 2019-06-11 MED ORDER — HYDROCODONE-ACETAMINOPHEN 5-325 MG PO TABS
ORAL_TABLET | ORAL | Status: AC
  Filled 2019-06-11: qty 1

## 2019-06-11 MED ORDER — KETOROLAC TROMETHAMINE 30 MG/ML IJ SOLN
INTRAMUSCULAR | Status: DC | PRN
  Administered 2019-06-11: 30 mg via INTRAVENOUS

## 2019-06-11 MED ORDER — GABAPENTIN 300 MG PO CAPS: 300.0000 mg | ORAL_CAPSULE | Freq: Every day | ORAL | Status: DC | PRN

## 2019-06-11 MED ORDER — AMLODIPINE BESYLATE 5 MG PO TABS: 10.0000 mg | ORAL_TABLET | Freq: Every day | ORAL | Status: DC

## 2019-06-11 MED ORDER — LIDOCAINE 2% (20 MG/ML) 5 ML SYRINGE
INTRAMUSCULAR | Status: AC
  Filled 2019-06-11: qty 5

## 2019-06-11 MED ORDER — CHLORHEXIDINE GLUCONATE CLOTH 2 % EX PADS: 6.0000 | MEDICATED_PAD | Freq: Once | CUTANEOUS | Status: DC

## 2019-06-11 MED ORDER — MENTHOL 3 MG MT LOZG: 1.0000 | LOZENGE | OROMUCOSAL | Status: DC | PRN

## 2019-06-11 MED ORDER — PHENOL 1.4 % MT LIQD: 1.0000 | OROMUCOSAL | Status: DC | PRN

## 2019-06-11 MED ORDER — ASPIRIN EC 81 MG PO TBEC: 81.0000 mg | DELAYED_RELEASE_TABLET | Freq: Every day | ORAL | Status: DC

## 2019-06-11 MED ORDER — THROMBIN 5000 UNITS EX SOLR
CUTANEOUS | Status: AC
  Filled 2019-06-11: qty 10000

## 2019-06-11 MED ORDER — KETOROLAC TROMETHAMINE 15 MG/ML IJ SOLN
30.0000 mg | Freq: Four times a day (QID) | INTRAMUSCULAR | Status: DC
  Administered 2019-06-11: 16:00:00 30 mg via INTRAVENOUS
  Filled 2019-06-11: qty 2

## 2019-06-11 MED ORDER — MIDAZOLAM HCL 2 MG/2ML IJ SOLN
INTRAMUSCULAR | Status: DC | PRN
  Administered 2019-06-11: 2 mg via INTRAVENOUS

## 2019-06-11 MED ORDER — HYDROMORPHONE HCL 1 MG/ML IJ SOLN: 0.2500 mg | INTRAMUSCULAR | Status: DC | PRN

## 2019-06-11 MED ORDER — ACETAMINOPHEN 650 MG RE SUPP: 650.0000 mg | RECTAL | Status: DC | PRN

## 2019-06-11 MED ORDER — SODIUM CHLORIDE 0.9 % IV SOLN: 250.0000 mL | INTRAVENOUS | Status: DC

## 2019-06-11 MED ORDER — HEPARIN SODIUM (PORCINE) 5000 UNIT/ML IJ SOLN: 5000.0000 [IU] | Freq: Three times a day (TID) | INTRAMUSCULAR | Status: DC

## 2019-06-11 MED ORDER — HYDROMORPHONE HCL 1 MG/ML IJ SOLN: 1.0000 mg | INTRAMUSCULAR | Status: DC | PRN

## 2019-06-11 MED ORDER — FUROSEMIDE 40 MG PO TABS: 40.0000 mg | ORAL_TABLET | Freq: Every day | ORAL | Status: DC

## 2019-06-11 MED ORDER — CEFAZOLIN SODIUM-DEXTROSE 2-4 GM/100ML-% IV SOLN
2.0000 g | INTRAVENOUS | Status: AC
  Administered 2019-06-11: 2 g via INTRAVENOUS

## 2019-06-11 MED ORDER — ACETAMINOPHEN 500 MG PO TABS: 1000.0000 mg | ORAL_TABLET | Freq: Once | ORAL | Status: DC

## 2019-06-11 MED ORDER — 0.9 % SODIUM CHLORIDE (POUR BTL) OPTIME
TOPICAL | Status: DC | PRN
  Administered 2019-06-11: 1000 mL

## 2019-06-11 MED ORDER — PROPOFOL 10 MG/ML IV BOLUS
INTRAVENOUS | Status: AC
  Filled 2019-06-11: qty 20

## 2019-06-11 MED ORDER — SUCCINYLCHOLINE CHLORIDE 200 MG/10ML IV SOSY
PREFILLED_SYRINGE | INTRAVENOUS | Status: AC
  Filled 2019-06-11: qty 10

## 2019-06-11 MED ORDER — VITAMIN B-12 1000 MCG PO TABS
5000.0000 ug | ORAL_TABLET | Freq: Every day | ORAL | Status: DC
  Filled 2019-06-11: qty 5

## 2019-06-11 MED ORDER — EZETIMIBE 10 MG PO TABS
10.0000 mg | ORAL_TABLET | Freq: Every day | ORAL | Status: DC
  Filled 2019-06-11: qty 1

## 2019-06-11 MED ORDER — ROCURONIUM BROMIDE 10 MG/ML (PF) SYRINGE
PREFILLED_SYRINGE | INTRAVENOUS | Status: AC
  Filled 2019-06-11: qty 10

## 2019-06-11 MED ORDER — CYCLOBENZAPRINE HCL 10 MG PO TABS: 10.0000 mg | ORAL_TABLET | Freq: Three times a day (TID) | ORAL | Status: DC | PRN

## 2019-06-11 MED ORDER — SUGAMMADEX SODIUM 200 MG/2ML IV SOLN
INTRAVENOUS | Status: DC | PRN
  Administered 2019-06-11: 200 mg via INTRAVENOUS

## 2019-06-11 MED ORDER — SODIUM CHLORIDE 0.9% FLUSH: 3.0000 mL | INTRAVENOUS | Status: DC | PRN

## 2019-06-11 MED ORDER — FENTANYL CITRATE (PF) 250 MCG/5ML IJ SOLN
INTRAMUSCULAR | Status: DC | PRN
  Administered 2019-06-11: 50 ug via INTRAVENOUS
  Administered 2019-06-11: 100 ug via INTRAVENOUS

## 2019-06-11 MED ORDER — LEVOTHYROXINE SODIUM 25 MCG PO TABS: 50.0000 ug | ORAL_TABLET | Freq: Every day | ORAL | Status: DC

## 2019-06-11 MED ORDER — SODIUM CHLORIDE 0.9% FLUSH: 3.0000 mL | Freq: Two times a day (BID) | INTRAVENOUS | Status: DC

## 2019-06-11 MED ORDER — FENTANYL CITRATE (PF) 250 MCG/5ML IJ SOLN
INTRAMUSCULAR | Status: AC
  Filled 2019-06-11: qty 5

## 2019-06-11 MED ORDER — ONDANSETRON HCL 4 MG/2ML IJ SOLN
INTRAMUSCULAR | Status: DC | PRN
  Administered 2019-06-11: 4 mg via INTRAVENOUS

## 2019-06-11 MED ORDER — INSULIN ASPART 100 UNIT/ML ~~LOC~~ SOLN
0.0000 [IU] | Freq: Three times a day (TID) | SUBCUTANEOUS | Status: DC
  Administered 2019-06-11: 20 [IU] via SUBCUTANEOUS

## 2019-06-11 MED ORDER — PROPOFOL 10 MG/ML IV BOLUS
INTRAVENOUS | Status: DC | PRN
  Administered 2019-06-11: 10 mg via INTRAVENOUS
  Administered 2019-06-11: 120 mg via INTRAVENOUS

## 2019-06-11 MED ORDER — ROCURONIUM BROMIDE 10 MG/ML (PF) SYRINGE
PREFILLED_SYRINGE | INTRAVENOUS | Status: DC | PRN
  Administered 2019-06-11: 40 mg via INTRAVENOUS

## 2019-06-11 MED ORDER — METFORMIN HCL 500 MG PO TABS
500.0000 mg | ORAL_TABLET | Freq: Every day | ORAL | Status: DC
  Administered 2019-06-11: 16:00:00 500 mg via ORAL
  Filled 2019-06-11: qty 1

## 2019-06-11 MED ORDER — BUPIVACAINE HCL (PF) 0.25 % IJ SOLN
INTRAMUSCULAR | Status: AC
  Filled 2019-06-11: qty 30

## 2019-06-11 MED ORDER — ONDANSETRON HCL 4 MG/2ML IJ SOLN
INTRAMUSCULAR | Status: AC
  Filled 2019-06-11: qty 2

## 2019-06-11 MED ORDER — ACETAMINOPHEN 325 MG PO TABS: 650.0000 mg | ORAL_TABLET | ORAL | Status: DC | PRN

## 2019-06-11 MED ORDER — HEMOSTATIC AGENTS (NO CHARGE) OPTIME
TOPICAL | Status: DC | PRN
  Administered 2019-06-11: 1 via TOPICAL

## 2019-06-11 MED ORDER — NITROGLYCERIN 0.4 MG SL SUBL: 0.4000 mg | SUBLINGUAL_TABLET | SUBLINGUAL | Status: DC | PRN

## 2019-06-11 MED ORDER — ONDANSETRON HCL 4 MG PO TABS: 4.0000 mg | ORAL_TABLET | Freq: Four times a day (QID) | ORAL | Status: DC | PRN

## 2019-06-11 MED ORDER — LIDOCAINE 2% (20 MG/ML) 5 ML SYRINGE
INTRAMUSCULAR | Status: DC | PRN
  Administered 2019-06-11: 60 mg via INTRAVENOUS
  Administered 2019-06-11: 40 mg via INTRAVENOUS

## 2019-06-11 MED ORDER — BENAZEPRIL HCL 40 MG PO TABS
40.0000 mg | ORAL_TABLET | Freq: Every day | ORAL | Status: DC
  Filled 2019-06-11: qty 1

## 2019-06-11 MED ORDER — SUCCINYLCHOLINE CHLORIDE 200 MG/10ML IV SOSY
PREFILLED_SYRINGE | INTRAVENOUS | Status: DC | PRN
  Administered 2019-06-11: 100 mg via INTRAVENOUS

## 2019-06-11 MED ORDER — LACTATED RINGERS IV SOLN
INTRAVENOUS | Status: DC
  Administered 2019-06-11 (×2): via INTRAVENOUS

## 2019-06-11 MED ORDER — HYDROCODONE-ACETAMINOPHEN 10-325 MG PO TABS: 2.0000 | ORAL_TABLET | ORAL | Status: DC | PRN

## 2019-06-11 MED ORDER — THROMBIN 5000 UNITS EX SOLR
CUTANEOUS | Status: DC | PRN
  Administered 2019-06-11 (×2): 5000 [IU] via TOPICAL

## 2019-06-11 MED ORDER — VITAMIN C 500 MG PO TABS: 500.0000 mg | ORAL_TABLET | Freq: Every day | ORAL | Status: DC

## 2019-06-11 MED ORDER — BUPIVACAINE HCL (PF) 0.25 % IJ SOLN
INTRAMUSCULAR | Status: DC | PRN
  Administered 2019-06-11: 20 mL

## 2019-06-11 MED ORDER — DEXAMETHASONE SODIUM PHOSPHATE 10 MG/ML IJ SOLN
INTRAMUSCULAR | Status: AC
  Filled 2019-06-11: qty 1

## 2019-06-11 MED ORDER — ARTIFICIAL TEARS OPHTHALMIC OINT
TOPICAL_OINTMENT | OPHTHALMIC | Status: AC
  Filled 2019-06-11: qty 3.5

## 2019-06-11 MED ORDER — CEFAZOLIN SODIUM-DEXTROSE 1-4 GM/50ML-% IV SOLN
1.0000 g | Freq: Three times a day (TID) | INTRAVENOUS | Status: DC
  Administered 2019-06-11: 1 g via INTRAVENOUS
  Filled 2019-06-11: qty 50

## 2019-06-11 MED ORDER — ONDANSETRON HCL 4 MG/2ML IJ SOLN: 4.0000 mg | Freq: Four times a day (QID) | INTRAMUSCULAR | Status: DC | PRN

## 2019-06-11 MED ORDER — SODIUM CHLORIDE 0.9 % IV SOLN
INTRAVENOUS | Status: DC | PRN
  Administered 2019-06-11: 11:00:00 25 ug/min via INTRAVENOUS

## 2019-06-11 MED ORDER — MIDAZOLAM HCL 2 MG/2ML IJ SOLN
INTRAMUSCULAR | Status: AC
  Filled 2019-06-11: qty 2

## 2019-06-11 SURGICAL SUPPLY — 53 items
BAG DECANTER FOR FLEXI CONT (MISCELLANEOUS) ×2 IMPLANT
BENZOIN TINCTURE PRP APPL 2/3 (GAUZE/BANDAGES/DRESSINGS) ×2 IMPLANT
BLADE CLIPPER SURG (BLADE) IMPLANT
BUR CUTTER 7.0 ROUND (BURR) ×2 IMPLANT
CANISTER SUCT 3000ML PPV (MISCELLANEOUS) ×2 IMPLANT
CARTRIDGE OIL MAESTRO DRILL (MISCELLANEOUS) ×1 IMPLANT
COVER WAND RF STERILE (DRAPES) ×1 IMPLANT
DECANTER SPIKE VIAL GLASS SM (MISCELLANEOUS) ×2 IMPLANT
DERMABOND ADVANCED (GAUZE/BANDAGES/DRESSINGS) ×1
DERMABOND ADVANCED .7 DNX12 (GAUZE/BANDAGES/DRESSINGS) ×1 IMPLANT
DIFFUSER DRILL AIR PNEUMATIC (MISCELLANEOUS) ×2 IMPLANT
DRAPE HALF SHEET 40X57 (DRAPES) IMPLANT
DRAPE LAPAROTOMY 100X72X124 (DRAPES) ×2 IMPLANT
DRAPE MICROSCOPE LEICA (MISCELLANEOUS) ×1 IMPLANT
DRAPE SURG 17X23 STRL (DRAPES) ×4 IMPLANT
DRSG OPSITE POSTOP 4X6 (GAUZE/BANDAGES/DRESSINGS) ×1 IMPLANT
DRSG OPSITE POSTOP 4X8 (GAUZE/BANDAGES/DRESSINGS) IMPLANT
DURAPREP 26ML APPLICATOR (WOUND CARE) ×2 IMPLANT
ELECT REM PT RETURN 9FT ADLT (ELECTROSURGICAL) ×2
ELECTRODE REM PT RTRN 9FT ADLT (ELECTROSURGICAL) ×1 IMPLANT
GAUZE 4X4 16PLY RFD (DISPOSABLE) IMPLANT
GAUZE SPONGE 4X4 12PLY STRL (GAUZE/BANDAGES/DRESSINGS) ×1 IMPLANT
GLOVE BIO SURGEON STRL SZ 6.5 (GLOVE) ×1 IMPLANT
GLOVE BIO SURGEON STRL SZ7 (GLOVE) ×1 IMPLANT
GLOVE BIOGEL PI IND STRL 6.5 (GLOVE) IMPLANT
GLOVE BIOGEL PI IND STRL 7.5 (GLOVE) IMPLANT
GLOVE BIOGEL PI INDICATOR 6.5 (GLOVE) ×1
GLOVE BIOGEL PI INDICATOR 7.5 (GLOVE) ×1
GLOVE ECLIPSE 9.0 STRL (GLOVE) ×3 IMPLANT
GLOVE EXAM NITRILE XL STR (GLOVE) IMPLANT
GLOVE SURG SS PI 7.0 STRL IVOR (GLOVE) ×3 IMPLANT
GOWN STRL REUS W/ TWL LRG LVL3 (GOWN DISPOSABLE) IMPLANT
GOWN STRL REUS W/ TWL XL LVL3 (GOWN DISPOSABLE) ×1 IMPLANT
GOWN STRL REUS W/TWL 2XL LVL3 (GOWN DISPOSABLE) IMPLANT
GOWN STRL REUS W/TWL LRG LVL3 (GOWN DISPOSABLE) ×1
GOWN STRL REUS W/TWL XL LVL3 (GOWN DISPOSABLE) ×2
KIT BASIN OR (CUSTOM PROCEDURE TRAY) ×2 IMPLANT
KIT TURNOVER KIT B (KITS) ×2 IMPLANT
NDL SPNL 22GX3.5 QUINCKE BK (NEEDLE) IMPLANT
NEEDLE HYPO 22GX1.5 SAFETY (NEEDLE) ×2 IMPLANT
NEEDLE SPNL 22GX3.5 QUINCKE BK (NEEDLE) ×2 IMPLANT
NS IRRIG 1000ML POUR BTL (IV SOLUTION) ×2 IMPLANT
OIL CARTRIDGE MAESTRO DRILL (MISCELLANEOUS) ×2
PACK LAMINECTOMY NEURO (CUSTOM PROCEDURE TRAY) ×2 IMPLANT
PAD ARMBOARD 7.5X6 YLW CONV (MISCELLANEOUS) ×11 IMPLANT
RUBBERBAND STERILE (MISCELLANEOUS) ×2 IMPLANT
SPONGE SURGIFOAM ABS GEL SZ50 (HEMOSTASIS) ×2 IMPLANT
STRIP CLOSURE SKIN 1/2X4 (GAUZE/BANDAGES/DRESSINGS) ×2 IMPLANT
SUT VIC AB 2-0 CT1 18 (SUTURE) ×2 IMPLANT
SUT VIC AB 3-0 SH 8-18 (SUTURE) ×2 IMPLANT
TOWEL GREEN STERILE (TOWEL DISPOSABLE) ×2 IMPLANT
TOWEL GREEN STERILE FF (TOWEL DISPOSABLE) ×2 IMPLANT
WATER STERILE IRR 1000ML POUR (IV SOLUTION) ×2 IMPLANT

## 2019-06-11 NOTE — Discharge Summary (Signed)
Physician Discharge Summary  Patient ID: Abdirahim Flavell MRN: [id number] DOB/AGE: December 15, 1950 68 y.o.  Admit date: 06/11/2019 Discharge date: 06/11/2019  Admission Diagnoses:  Discharge Diagnoses:  Active Problems:   Lumbar stenosis with neurogenic claudication   Discharged Condition: good  [facility] uncomplicated lumbar decompressive surgery at L4-5.  Postoperatively doing very well.  Preoperative numbness and weakness resolved.  Standing and ambulating without difficulty.  Voiding well.  Ready for discharge home.  Consults:   Significant Diagnostic Studies:   Treatments:   Discharge Exam: Blood pressure (!) 152/81, pulse 78, temperature 97.9 F (36.6 C), temperature source Oral, resp. rate 16, height 5\' 9"  (1.753 m), weight 83.9 kg, SpO2 100 %. Awake and alert.  Oriented and appropriate.  Cranial nerve function intact.  Motor and sensory function extremities normal.  Wound clean and dry.  Chest and abdomen benign.  Disposition: Discharge disposition: 01-Home or Self Care        Allergies as of 06/11/2019      Reactions   Atorvastatin Other (See Comments)   Urinary retention    Metoprolol Diarrhea   Nebivolol Diarrhea      Medication List    TAKE these medications   amLODipine 10 MG tablet Commonly known as: NORVASC Take 10 mg by mouth at bedtime.   aspirin EC 81 MG tablet Take 81 mg by mouth daily.   B-12 5000 MCG Caps Take 5,000 mcg by mouth daily.   benazepril 40 MG tablet Commonly known as: LOTENSIN Take 40 mg by mouth at bedtime.   cyclobenzaprine 10 MG tablet Commonly known as: FLEXERIL Take 1 tablet (10 mg total) by mouth at bedtime.   cyclobenzaprine 10 MG tablet Commonly known as: FLEXERIL Take 1 tablet (10 mg total) by mouth 3 (three) times daily as needed for muscle spasms.   ezetimibe 10 MG tablet Commonly known as: ZETIA TAKE 1 TABLET BY MOUTH EVERY DAY   furosemide 40 MG  tablet Commonly known as: LASIX Take 40 mg by mouth daily.   gabapentin 300 MG capsule Commonly known as: NEURONTIN TAKE 1 CAPSULE BY MOUTH AT BEDTIME FOR 1 WEEK, THEN 1 CAPSULE BY MOUTH TWICE A DAY What changed:   how much to take  how to take this  when to take this  reasons to take this  additional instructions   HYDROcodone-acetaminophen 5-325 MG tablet Commonly known as: NORCO/VICODIN Take 1 tablet by mouth every 4 (four) hours as needed for moderate pain ((score 4 to 6)).   levothyroxine 50 MCG tablet Commonly known as: SYNTHROID Take 50 mcg by mouth at bedtime.   meloxicam 7.5 MG tablet Commonly known as: MOBIC Take 7.5 mg by mouth 2 (two) times a day.   metFORMIN 500 MG tablet Commonly known as: GLUCOPHAGE Take 500 mg by mouth at bedtime.   nitroGLYCERIN 0.4 MG SL tablet Commonly known as: NITROSTAT Place 0.4 mg under the tongue every 5 (five) minutes as needed for chest pain.   sildenafil 100 MG tablet Commonly known as: VIAGRA Take 50 mg by mouth as needed for erectile dysfunction.   testosterone cypionate 100 MG/ML injection Commonly known as: DEPOTESTOTERONE CYPIONATE Inject 400 mg into the muscle every 14 (fourteen) days.   vitamin C 500 MG tablet Commonly known as: ASCORBIC ACID Take 500 mg by mouth daily.        Signed: Cooper Render Nixon Kolton 06/11/2019, 6:50 PM

## 2019-06-11 NOTE — Transfer of Care (Signed)
Immediate Anesthesia Transfer of Care Note  Patient: Johnathan Murray  Procedure(s) Performed: Bilateral Lumbar Four-FiveLaminectomy/Foraminotomy (Bilateral Spine Lumbar)  Patient Location: PACU  Anesthesia Type:General  Level of Consciousness: patient cooperative and responds to stimulation  Airway & Oxygen Therapy: Patient Spontanous Breathing  Post-op Assessment: Report given to RN and Post -op Vital signs reviewed and stable  Post vital signs: Reviewed and stable  Last Vitals:  Vitals Value Taken Time  BP 155/87 06/11/19 1242  Temp    Pulse 90 06/11/19 1244  Resp 14 06/11/19 1244  SpO2 97 % 06/11/19 1244  Vitals shown include unvalidated device data.  Last Pain:  Vitals:   06/11/19 0914  TempSrc:   PainSc: 0-No pain         Complications: No apparent anesthesia complications

## 2019-06-11 NOTE — Brief Op Note (Signed)
06/11/2019  12:31 PM  PATIENT:  Johnathan Murray  68 y.o. male  PRE-OPERATIVE DIAGNOSIS:  Spinal stenosis of lumbar region with neurogenic claudication  POST-OPERATIVE DIAGNOSIS:  Spinal stenosis of lumbar region with neurogenic claudication  PROCEDURE:  Procedure(s) with comments: Bilateral Lumbar Four-FiveLaminectomy/Foraminotomy (Bilateral) - posterior  SURGEON:  Surgeon(s) and Role:    Earnie Larsson, MD - Primary  PHYSICIAN ASSISTANT:   ASSISTANTSReinaldo Meeker, NP   ANESTHESIA:   general  EBL:  200 mL   BLOOD ADMINISTERED:none  DRAINS: none   LOCAL MEDICATIONS USED:  MARCAINE     SPECIMEN:  No Specimen  DISPOSITION OF SPECIMEN:  N/A  COUNTS:  YES  TOURNIQUET:  * No tourniquets in log *  DICTATION: .Dragon Dictation  PLAN OF CARE: Admit for overnight observation  PATIENT DISPOSITION:  PACU - hemodynamically stable.   Delay start of Pharmacological VTE agent (>24hrs) due to surgical blood loss or risk of bleeding: yes

## 2019-06-11 NOTE — Anesthesia Preprocedure Evaluation (Addendum)
Anesthesia Evaluation  Patient identified by MRN, date of birth, ID band Patient awake    Reviewed: Allergy & Precautions, H&P , NPO status , Patient's Chart, lab work & pertinent test results  Airway Mallampati: III  TM Distance: >3 FB Neck ROM: Full    Dental no notable dental hx. (+) Teeth Intact, Dental Advisory Given   Pulmonary neg pulmonary ROS,    Pulmonary exam normal breath sounds clear to auscultation       Cardiovascular hypertension, Pt. on medications + CAD  + Valvular Problems/Murmurs AS  Rhythm:Regular Rate:Normal     Neuro/Psych negative neurological ROS  negative psych ROS   GI/Hepatic Neg liver ROS, GERD  ,  Endo/Other  diabetes, Type 2, Oral Hypoglycemic AgentsHypothyroidism   Renal/GU negative Renal ROS  negative genitourinary   Musculoskeletal  (+) Arthritis , Osteoarthritis,    Abdominal   Peds  Hematology  (+) Blood dyscrasia, anemia ,   Anesthesia Other Findings   Reproductive/Obstetrics negative OB ROS                            Anesthesia Physical Anesthesia Plan  ASA: III  Anesthesia Plan: General   Post-op Pain Management:    Induction: Intravenous  PONV Risk Score and Plan: 3 and Ondansetron, Dexamethasone and Midazolam  Airway Management Planned: Oral ETT  Additional Equipment: Arterial line  Intra-op Plan:   Post-operative Plan: Extubation in OR  Informed Consent: I have reviewed the patients History and Physical, chart, labs and discussed the procedure including the risks, benefits and alternatives for the proposed anesthesia with the patient or authorized representative who has indicated his/her understanding and acceptance.     Dental advisory given  Plan Discussed with: CRNA  Anesthesia Plan Comments:         Anesthesia Quick Evaluation

## 2019-06-11 NOTE — Discharge Instructions (Signed)

## 2019-06-11 NOTE — Op Note (Signed)
Date of procedure: 06/11/2019  Date of dictation: Same  Service: Neurosurgery  Preoperative diagnosis: Lumbar stenosis with neurogenic claudication  Postoperative diagnosis: Same  Procedure Name: Bilateral L4-5 decompressive laminotomies and foraminotomies  Surgeon:Geraldyn Shain A.Grete Bosko, M.D.  Asst. Surgeon: Reinaldo Meeker, NP  Anesthesia: General  Indication: [age]-year-old male with back and bilateral lower extremity pain paresthesias and weakness consistent with severe neurogenic claudication.  Work-up demonstrates evidence of critical stenosis at L4-5.  No evidence of instability.  Plan for L4-5 decompressive surgery in hopes of improving her symptoms.  Operative note: After induction of anesthesia, patient position prone onto Wilson frame and appropriate padded.  Lumbar region prepped and draped sterilely.  Incision made overlying L4-5.  Dissection performed bilaterally.  Retractor placed.  X-ray taken.  Levels confirmed.  Laminotomy was then performed using high-speed drill and Kerrison Rogers to remove the inferior aspect lamina of L for the medial aspect L4-5 facet joints and the superior rim of the L5 lamina bilaterally.  Ligament flavum elevated and resected.  Underlying thecal sac identified.  Foraminotomies completed on the course the exiting L4 and L5 nerve roots.  Disc space inspected on both sides.  No evidence of significant annular tear or disc herniation.  The disc space itself was grossly bulging but not soft.  Wound is then irrigated with fanlike solution.  Gelfoam was placed topically for hemostasis.  Wounds and closed in layers with Vicryl sutures.  Steri-Strips and sterile dressing were applied.  No apparent complications.  Patient tolerated the procedure well and he returns to the recovery room postop.

## 2019-06-11 NOTE — Anesthesia Postprocedure Evaluation (Signed)
Anesthesia Post Note  Patient: Johnathan Murray  Procedure(s) Performed: Bilateral Lumbar Four-FiveLaminectomy/Foraminotomy (Bilateral Spine Lumbar)     Patient location during evaluation: PACU Anesthesia Type: General Level of consciousness: awake and alert Pain management: pain level controlled Vital Signs Assessment: post-procedure vital signs reviewed and stable Respiratory status: spontaneous breathing, nonlabored ventilation and respiratory function stable Cardiovascular status: blood pressure returned to baseline and stable Postop Assessment: no apparent nausea or vomiting Anesthetic complications: no    Last Vitals:  Vitals:   06/11/19 0902 06/11/19 1242  BP: (!) 165/76 (!) 155/87  Pulse: 75 90  Resp: 20 12  Temp: 36.7 C 36.8 C  SpO2: 99% 97%    Last Pain:  Vitals:   06/11/19 0914  TempSrc:   PainSc: 0-No pain                 Nellie Chevalier,W. EDMOND

## 2019-06-11 NOTE — H&P (Signed)
Johnathan Murray is an 68 y.o. male.   Chief Complaint: Back pain HPI: 68 year old male with chronic lower back pain with increasing bilateral lower extremity numbness paresthesias and weakness.  Work-up demonstrates evidence of critical multifactorial stenosis at L4-5.  Patient is failed conservative management.  He presents now for lumbar decompressive surgery in hopes of improving his symptoms.  Past Medical History:  Diagnosis Date  . Abnormal LFTs (liver function tests) 10/31/2016  . Acid reflux 02/15/2018  . Anemia    low iron  . Coronary artery disease involving native coronary artery of native heart without angina pectoris 05/30/2016  . Diabetes mellitus due to underlying condition with unspecified complications ([place]) 3/50/0938  . Dyslipidemia 05/30/2016  . Dyspnea    upon exertion,  as of 06/05/09 not having any recent chest pain  . Essential hypertension 05/30/2016  . Heart murmur    Has had an Aortic valve replacement  . Hyperactive gag reflex 02/15/2018  . Hypothyroidism   . Macular degeneration 02/15/2018  . PAF (paroxysmal atrial fibrillation) ([place]) 05/30/2016    Past Surgical History:  Procedure Laterality Date  . ANTERIOR CERVICAL DECOMP/DISCECTOMY FUSION N/Johnathan 04/22/2019   Procedure: Anterior Cervical Discectomy and Fusion, Cervical four-five, Cervical five-six;  Surgeon: Earnie Larsson, MD;  Location: [facility];  Service: Neurosurgery;  Laterality: N/Johnathan;  . AORTIC VALVE REPLACEMENT    . CARDIAC CATHETERIZATION     several stents  . CARDIAC SURGERY    . CORONARY ARTERY BYPASS GRAFT  2017   patient unsure how many, had at [facility]  . HAND SURGERY    . KNEE SURGERY    . LEFT HEART CATH AND CORS/GRAFTS ANGIOGRAPHY N/Johnathan 05/16/2018   Procedure: LEFT HEART CATH AND CORS/GRAFTS ANGIOGRAPHY;  Surgeon: Burnell Blanks, MD;  Location: [facility];  Service: Cardiovascular;  Laterality: N/Johnathan;    Family History  Problem Relation Age of Onset  . Prostate  cancer Brother   . Heart disease Maternal Grandmother   . Brain cancer Mother    Social History:  reports that he has never smoked. He has never used smokeless tobacco. He reports current alcohol use of about 2.0 - 3.0 standard drinks of alcohol per week. He reports that he does not use drugs.  Allergies:  Allergies  Allergen Reactions  . Atorvastatin Other (See Comments)    Urinary retention   . Metoprolol Diarrhea  . Nebivolol Diarrhea    Medications Prior to Admission  Medication Sig Dispense Refill  . amLODipine (NORVASC) 10 MG tablet Take 10 mg by mouth at bedtime.     Marland Kitchen aspirin EC 81 MG tablet Take 81 mg by mouth daily.     . benazepril (LOTENSIN) 40 MG tablet Take 40 mg by mouth at bedtime.   3  . Cyanocobalamin (B-12) 5000 MCG CAPS Take 5,000 mcg by mouth daily.     . cyclobenzaprine (FLEXERIL) 10 MG tablet Take 1 tablet (10 mg total) by mouth 3 (three) times daily as needed for muscle spasms. 30 tablet 0  . ezetimibe (ZETIA) 10 MG tablet TAKE 1 TABLET BY MOUTH EVERY DAY (Patient taking differently: Take 10 mg by mouth daily. ) 90 tablet 0  . furosemide (LASIX) 40 MG tablet Take 40 mg by mouth daily.  3  . gabapentin (NEURONTIN) 300 MG capsule TAKE 1 CAPSULE BY MOUTH AT BEDTIME FOR 1 WEEK, THEN 1 CAPSULE BY MOUTH TWICE Johnathan DAY (Patient taking differently: Take 300 mg by mouth daily as needed (  pain). ) 180 capsule 2  . levothyroxine (SYNTHROID, LEVOTHROID) 50 MCG tablet Take 50 mcg by mouth at bedtime.   1  . meloxicam (MOBIC) 7.5 MG tablet Take 7.5 mg by mouth 2 (two) times Johnathan day.   2  . metFORMIN (GLUCOPHAGE) 500 MG tablet Take 500 mg by mouth at bedtime.     . nitroGLYCERIN (NITROSTAT) 0.4 MG SL tablet Place 0.4 mg under the tongue every 5 (five) minutes as needed for chest pain.     Marland Kitchen testosterone cypionate (DEPOTESTOTERONE CYPIONATE) 100 MG/ML injection Inject 400 mg into the muscle every 14 (fourteen) days.     . vitamin C (ASCORBIC ACID) 500 MG tablet Take 500 mg by mouth  daily.     . cyclobenzaprine (FLEXERIL) 10 MG tablet Take 1 tablet (10 mg total) by mouth at bedtime. (Patient not taking: Reported on 06/05/2019) 30 tablet 3  . HYDROcodone-acetaminophen (NORCO/VICODIN) 5-325 MG tablet Take 1 tablet by mouth every 4 (four) hours as needed for moderate pain ((score 4 to 6)). (Patient not taking: Reported on 06/05/2019) 30 tablet 0  . sildenafil (VIAGRA) 100 MG tablet Take 50 mg by mouth as needed for erectile dysfunction.       Results for orders placed or performed during the hospital encounter of 06/11/19 (from the past 48 hour(s))  Glucose, capillary     Status: Abnormal   Collection Time: 06/11/19  9:05 AM  Result Value Ref Range   Glucose-Capillary 165 (H) 70 - 99 mg/dL   Comment 1 Notify RN    Comment 2 Document in Chart    No results found.  Pertinent items noted in HPI and remainder of comprehensive ROS otherwise negative.  Blood pressure (!) 165/76, pulse 75, temperature 98.1 F (36.7 C), temperature source Oral, resp. rate 20, height 5\' 9"  (1.753 m), weight 83.9 kg, SpO2 99 %.  Patient is awake and alert.  He is oriented and appropriate.  Speech is fluent.  Judgment insight are intact.  Cranial nerve function normal bilateral.  Motor examination extremities reveals mild weakness of dorsiflexion bilaterally somewhat worse on the left.  Sensory examination with decrease sensation pinprick light touch in his L5 and S1 dermatomes.  Deep nerve excision are normal active except his Achilles reflexes are absent bilaterally.  No evidence of long track signs.  Examination head ears eyes nose throat is unremarked.  Chest and abdomen are benign.  Extremities are free from injury or deformity. Assessment/Plan L4-5 spinal stenosis with neurogenic claudication.  Plan bilateral L4-5 decompressive laminotomies and foraminotomies.  Risks and benefits of been explained.  Patient wishes to proceed.  Johnathan Murray Johnathan Murray 06/11/2019, 10:06 AM

## 2019-06-11 NOTE — Anesthesia Procedure Notes (Signed)
Arterial Line Insertion Start/End7/21/2020 10:07 AM, 06/11/2019 10:11 AM Performed by: Wilburn Cornelia, CRNA, CRNA  Preanesthetic checklist: patient identified, IV checked, site marked, risks and benefits discussed, surgical consent, monitors and equipment checked, pre-op evaluation, timeout performed and anesthesia consent Lidocaine 1% used for infiltration and patient sedated Right, radial was placed Catheter size: 20 G Hand hygiene performed  and maximum sterile barriers used  Allen's test indicative of satisfactory collateral circulation Attempts: 1 Procedure performed without using ultrasound guided technique. Following insertion, Biopatch and dressing applied. Post procedure assessment: normal

## 2019-06-11 NOTE — Progress Notes (Signed)
Discharge instructions/education/AVS/Rx given to patient and verbalized understanding. No drainage, no swelling noted on incision site with dressing clean, dry and intact. Pain is mild and tolerable per patient. Ambulating well with supervision. Voiding and emptying bladder well. Patient very anxious to go home, discharged via wheelchair.

## 2019-06-12 ENCOUNTER — Encounter (HOSPITAL_COMMUNITY): Payer: Self-pay | Admitting: Neurosurgery

## 2019-06-20 ENCOUNTER — Telehealth: Payer: Self-pay | Admitting: *Deleted

## 2019-06-20 NOTE — Telephone Encounter (Signed)
Sent calender with new appt time and date to pt per his request.

## 2019-07-18 ENCOUNTER — Telehealth: Payer: BLUE CROSS/BLUE SHIELD | Admitting: Cardiology

## 2019-07-26 ENCOUNTER — Ambulatory Visit (INDEPENDENT_AMBULATORY_CARE_PROVIDER_SITE_OTHER): Payer: BC Managed Care – PPO | Admitting: Cardiology

## 2019-07-26 ENCOUNTER — Other Ambulatory Visit: Payer: Self-pay

## 2019-07-26 ENCOUNTER — Encounter: Payer: Self-pay | Admitting: Cardiology

## 2019-07-26 VITALS — BP 154/78 | HR 72 | Ht 70.0 in | Wt 196.2 lb

## 2019-07-26 DIAGNOSIS — I10 Essential (primary) hypertension: Secondary | ICD-10-CM | POA: Diagnosis not present

## 2019-07-26 DIAGNOSIS — I480 Paroxysmal atrial fibrillation: Secondary | ICD-10-CM | POA: Diagnosis not present

## 2019-07-26 DIAGNOSIS — E088 Diabetes mellitus due to underlying condition with unspecified complications: Secondary | ICD-10-CM | POA: Diagnosis not present

## 2019-07-26 DIAGNOSIS — I25118 Atherosclerotic heart disease of native coronary artery with other forms of angina pectoris: Secondary | ICD-10-CM | POA: Diagnosis not present

## 2019-07-26 NOTE — Progress Notes (Signed)
Cardiology Office Note:    Date:  07/26/2019   ID:  Johnathan Murray, DOB 1951-08-31, MRN [id number]  PCP:  Nicoletta Dress, MD  Cardiologist:  Jenean Lindau, MD   Referring MD: Nicoletta Dress, MD    ASSESSMENT:    1. PAF (paroxysmal atrial fibrillation) ([place])   2. Essential hypertension   3. Coronary artery disease of native artery of native heart with stable angina pectoris ([place])   4. Diabetes mellitus due to underlying condition with unspecified complications ([place])    PLAN:    In order of problems listed above:  1. Coronary artery disease: Secondary prevention stressed with the patient.  Importance of compliance with diet and medication stressed and he vocalized understanding. 2. Essential hypertension: His blood pressure stable 3. Mixed dyslipidemia and diabetes mellitus: Diet was discussed.  He has gained some weight and has been lax with his diet.  He is not keen on statins as he is intolerant to them. 4. Paroxysmal fibrillation: Anticoagulation issues were discussed but is not keen.  He is also not very stable on his feet yet.  He tells me that he has cut down on caffeinated drinks and his palpitations are better.  I suggested 2-week ZIO monitoring but he wants to hold off from it at this time and will let me know if his palpitations persist.  I respect his wishes. 5. Patient will be seen in follow-up appointment in 4 months or earlier if the patient has any concerns    Medication Adjustments/Labs and Tests Ordered: Current medicines are reviewed at length with the patient today.  Concerns regarding medicines are outlined above.  No orders of the defined types were placed in this encounter.  No orders of the defined types were placed in this encounter.    No chief complaint on file.    History of Present Illness:    Johnathan Murray is a 68 y.o. male.  Patient has past medical history of coronary artery disease, paroxysmal atrial fibrillation, diabetes  mellitus and dyslipidemia.  He denies any problems at this time and takes care of activities of daily living.  No chest pain orthopnea or PND.  He is intolerant to statins.  He was not steady on his feet and is gradually gaining strength.  He has had 2 spinal surgeries and is still weak from them.  He is getting exercise therapy.  He complains of some palpitations at times.  At the time of my evaluation, the patient is alert awake oriented and in no distress.  Past Medical History:  Diagnosis Date  . Abnormal LFTs (liver function tests) 10/31/2016  . Acid reflux 02/15/2018  . Anemia    low iron  . Coronary artery disease involving native coronary artery of native heart without angina pectoris 05/30/2016  . Diabetes mellitus due to underlying condition with unspecified complications ([place]) [id number]  . Dyslipidemia 05/30/2016  . Dyspnea    upon exertion,  as of 06/05/09 not having any recent chest pain  . Essential hypertension 05/30/2016  . Heart murmur    Has had an Aortic valve replacement  . Hyperactive gag reflex 02/15/2018  . Hypothyroidism   . Macular degeneration 02/15/2018  . PAF (paroxysmal atrial fibrillation) ([place]) 05/30/2016    Past Surgical History:  Procedure Laterality Date  . ANTERIOR CERVICAL DECOMP/DISCECTOMY FUSION N/A 04/22/2019   Procedure: Anterior Cervical Discectomy and Fusion, Cervical four-five, Cervical five-six;  Surgeon: Earnie Larsson, MD;  Location: [facility];  Service: Neurosurgery;  Laterality: N/A;  . AORTIC VALVE REPLACEMENT    . CARDIAC CATHETERIZATION     several stents  . CARDIAC SURGERY    . CORONARY ARTERY BYPASS GRAFT  2017   patient unsure how many, had at [facility]  . HAND SURGERY    . KNEE SURGERY    . LEFT HEART CATH AND CORS/GRAFTS ANGIOGRAPHY N/A 05/16/2018   Procedure: LEFT HEART CATH AND CORS/GRAFTS ANGIOGRAPHY;  Surgeon: Burnell Blanks, MD;  Location: [facility] [facility];  Service: Cardiovascular;  Laterality: N/A;  . LUMBAR  LAMINECTOMY/DECOMPRESSION MICRODISCECTOMY Bilateral 06/11/2019   Procedure: Bilateral Lumbar Four-FiveLaminectomy/Foraminotomy;  Surgeon: Earnie Larsson, MD;  Location: [place];  Service: Neurosurgery;  Laterality: Bilateral;  posterior    Current Medications: Current Meds  Medication Sig  . amLODipine (NORVASC) 10 MG tablet Take 10 mg by mouth at bedtime.   Marland Kitchen aspirin EC 81 MG tablet Take 81 mg by mouth daily.   . benazepril (LOTENSIN) 40 MG tablet Take 40 mg by mouth at bedtime.   . Coenzyme Q10 (COQ10) 200 MG CAPS Take by mouth.  . Cyanocobalamin (B-12) 5000 MCG CAPS Take 5,000 mcg by mouth daily.   . famotidine (PEPCID) 20 MG tablet   . furosemide (LASIX) 40 MG tablet Take 40 mg by mouth daily.  Marland Kitchen HYDROcodone-acetaminophen (NORCO/VICODIN) 5-325 MG tablet Take 1 tablet by mouth every 4 (four) hours as needed for moderate pain ((score 4 to 6)).  Marland Kitchen ibuprofen (ADVIL) 800 MG tablet TAKE 1 TABLET BY ORAL ROUTE 3 TIMES EVERY AS NEEDED FOR PAIN  . levothyroxine (SYNTHROID, LEVOTHROID) 50 MCG tablet Take 50 mcg by mouth at bedtime.   . meloxicam (MOBIC) 7.5 MG tablet Take 7.5 mg by mouth 2 (two) times a day.   . metFORMIN (GLUCOPHAGE) 500 MG tablet Take 500 mg by mouth at bedtime.   . mupirocin ointment (BACTROBAN) 2 % APPLY TO INSIDE OF EACH NOSTRIL TWICE A DAY FOR 5 DAYS.  Marland Kitchen nitroGLYCERIN (NITROSTAT) 0.4 MG SL tablet Place 0.4 mg under the tongue every 5 (five) minutes as needed for chest pain.   . pregabalin (LYRICA) 75 MG capsule TAKE 1 CAPSULE BY ORAL ROUTE 2 TIMES EVERY DAY FOR NERVE PAIN  . sildenafil (VIAGRA) 100 MG tablet Take 50 mg by mouth as needed for erectile dysfunction.   Marland Kitchen testosterone cypionate (DEPOTESTOTERONE CYPIONATE) 100 MG/ML injection Inject 400 mg into the muscle every 14 (fourteen) days.   . traZODone (DESYREL) 100 MG tablet TAKE 1 TABLET BY MOUTH EVERYDAY AT BEDTIME  . vitamin C (ASCORBIC ACID) 500 MG tablet Take 500 mg by mouth daily.      Allergies:   Atorvastatin,  Metoprolol, and Nebivolol   Social History   Socioeconomic History  . Marital status: Married    Spouse name: [name]  . Number of children: 1  . Years of education: Not on file  . Highest education level: GED or equivalent  Occupational History    Employer: [employer]  Social Needs  . Financial resource strain: Not on file  . Food insecurity    Worry: Not on file    Inability: Not on file  . Transportation needs    Medical: Not on file    Non-medical: Not on file  Tobacco Use  . Smoking status: Never Smoker  . Smokeless tobacco: Never Used  Substance and Sexual Activity  . Alcohol use: Yes    Alcohol/week: 2.0 - 3.0 standard drinks    Types: 2 -  3 Cans of beer per week  . Drug use: No  . Sexual activity: Not on file  Lifestyle  . Physical activity    Days per week: Not on file    Minutes per session: Not on file  . Stress: Not on file  Relationships  . Social Herbalist on phone: Not on file    Gets together: Not on file    Attends religious service: Not on file    Active member of club or organization: Not on file    Attends meetings of clubs or organizations: Not on file    Relationship status: Not on file  Other Topics Concern  . Not on file  Social History Narrative   He drives a truck for [employer].   He lives with wife in a one-level home.   Highest level of education:  GED      Patient is right-handed. There are 8 steps to enter his home.     Family History: The patient's family history includes Brain cancer in his mother; Heart disease in his maternal grandmother; Prostate cancer in his brother.  ROS:   Please see the history of present illness.    All other systems reviewed and are negative.  EKGs/Labs/Other Studies Reviewed:    The following studies were reviewed today: I reviewed records extensively.   Recent Labs: 06/06/2019: BUN 11; Potassium 3.7; Sodium 137 06/11/2019: Creatinine, Ser 1.63; Hemoglobin 13.6;  Platelets 228  Recent Lipid Panel    Component Value Date/Time   CHOL 217 (H) 05/21/2018 1130   TRIG 61 05/21/2018 1130   HDL 100 05/21/2018 1130   CHOLHDL 2.2 05/21/2018 1130   LDLCALC 105 (H) 05/21/2018 1130    Physical Exam:    VS:  BP (!) 154/78 (BP Location: Left Arm, Patient Position: Sitting, Cuff Size: Normal)   Pulse 72   Ht 5\' 10"  (1.778 m)   Wt 196 lb 3.2 oz (89 kg)   SpO2 97%   BMI 28.15 kg/m     Wt Readings from Last 3 Encounters:  07/26/19 196 lb 3.2 oz (89 kg)  06/11/19 185 lb (83.9 kg)  06/06/19 190 lb 8 oz (86.4 kg)     GEN: Patient is in no acute distress HEENT: Normal NECK: No JVD; No carotid bruits LYMPHATICS: No lymphadenopathy CARDIAC: Hear sounds regular, 2/6 systolic murmur at the apex. RESPIRATORY:  Clear to auscultation without rales, wheezing or rhonchi  ABDOMEN: Soft, non-tender, non-distended MUSCULOSKELETAL:  No edema; No deformity  SKIN: Warm and dry NEUROLOGIC:  Alert and oriented x 3 PSYCHIATRIC:  Normal affect   Signed, Jenean Lindau, MD  07/26/2019 3:51 PM     [facility]

## 2019-07-26 NOTE — Patient Instructions (Signed)
Medication Instructions:  Your physician recommends that you continue on your current medications as directed. Please refer to the Current Medication list given to you today.  If you need a refill on your cardiac medications before your next appointment, please call your pharmacy.   Lab work: NONE If you have labs (blood work) drawn today and your tests are completely normal, you will receive your results only by: . MyChart Message (if you have MyChart) OR . A paper copy in the mail If you have any lab test that is abnormal or we need to change your treatment, we will call you to review the results.  Testing/Procedures: NONE  Follow-Up: At [facility], you and your health needs are our priority.  As part of our continuing mission to provide you with exceptional heart care, we have created designated Provider Care Teams.  These Care Teams include your primary Cardiologist (physician) and Advanced Practice Providers (APPs -  Physician Assistants and Nurse Practitioners) who all work together to provide you with the care you need, when you need it. You will need a follow up appointment in 4 months.   

## 2019-08-12 DIAGNOSIS — Z23 Encounter for immunization: Secondary | ICD-10-CM | POA: Diagnosis not present

## 2019-08-12 DIAGNOSIS — I10 Essential (primary) hypertension: Secondary | ICD-10-CM | POA: Diagnosis not present

## 2019-08-12 DIAGNOSIS — D509 Iron deficiency anemia, unspecified: Secondary | ICD-10-CM | POA: Diagnosis not present

## 2019-08-12 DIAGNOSIS — R809 Proteinuria, unspecified: Secondary | ICD-10-CM | POA: Diagnosis not present

## 2019-08-12 DIAGNOSIS — E785 Hyperlipidemia, unspecified: Secondary | ICD-10-CM | POA: Diagnosis not present

## 2019-08-12 DIAGNOSIS — E1129 Type 2 diabetes mellitus with other diabetic kidney complication: Secondary | ICD-10-CM | POA: Diagnosis not present

## 2019-08-12 DIAGNOSIS — E291 Testicular hypofunction: Secondary | ICD-10-CM | POA: Diagnosis not present

## 2019-08-14 ENCOUNTER — Other Ambulatory Visit: Payer: Self-pay | Admitting: Cardiology

## 2019-08-14 DIAGNOSIS — M4802 Spinal stenosis, cervical region: Secondary | ICD-10-CM | POA: Diagnosis not present

## 2019-08-16 DIAGNOSIS — D509 Iron deficiency anemia, unspecified: Secondary | ICD-10-CM | POA: Diagnosis not present

## 2019-08-16 DIAGNOSIS — E785 Hyperlipidemia, unspecified: Secondary | ICD-10-CM | POA: Diagnosis not present

## 2019-09-03 DIAGNOSIS — M1712 Unilateral primary osteoarthritis, left knee: Secondary | ICD-10-CM | POA: Diagnosis not present

## 2019-10-10 DIAGNOSIS — M4802 Spinal stenosis, cervical region: Secondary | ICD-10-CM | POA: Diagnosis not present

## 2019-11-09 DIAGNOSIS — E1129 Type 2 diabetes mellitus with other diabetic kidney complication: Secondary | ICD-10-CM | POA: Diagnosis not present

## 2019-11-09 DIAGNOSIS — E785 Hyperlipidemia, unspecified: Secondary | ICD-10-CM | POA: Diagnosis not present

## 2019-11-09 DIAGNOSIS — E291 Testicular hypofunction: Secondary | ICD-10-CM | POA: Diagnosis not present

## 2019-11-09 DIAGNOSIS — D509 Iron deficiency anemia, unspecified: Secondary | ICD-10-CM | POA: Diagnosis not present

## 2019-11-09 DIAGNOSIS — Z125 Encounter for screening for malignant neoplasm of prostate: Secondary | ICD-10-CM | POA: Diagnosis not present

## 2019-11-12 ENCOUNTER — Encounter: Payer: Self-pay | Admitting: Gastroenterology

## 2019-11-13 DIAGNOSIS — H66002 Acute suppurative otitis media without spontaneous rupture of ear drum, left ear: Secondary | ICD-10-CM | POA: Insufficient documentation

## 2019-11-13 DIAGNOSIS — H9312 Tinnitus, left ear: Secondary | ICD-10-CM | POA: Insufficient documentation

## 2019-11-13 DIAGNOSIS — H60392 Other infective otitis externa, left ear: Secondary | ICD-10-CM | POA: Diagnosis not present

## 2019-11-13 DIAGNOSIS — H60502 Unspecified acute noninfective otitis externa, left ear: Secondary | ICD-10-CM | POA: Insufficient documentation

## 2019-11-13 DIAGNOSIS — H90A31 Mixed conductive and sensorineural hearing loss, unilateral, right ear with restricted hearing on the contralateral side: Secondary | ICD-10-CM

## 2019-11-13 DIAGNOSIS — H9012 Conductive hearing loss, unilateral, left ear, with unrestricted hearing on the contralateral side: Secondary | ICD-10-CM

## 2019-11-13 HISTORY — DX: Tinnitus, left ear: H93.12

## 2019-11-13 HISTORY — DX: Conductive hearing loss, unilateral, left ear, with unrestricted hearing on the contralateral side: H90.12

## 2019-11-13 HISTORY — DX: Acute suppurative otitis media without spontaneous rupture of ear drum, left ear: H66.002

## 2019-11-13 HISTORY — DX: Unspecified acute noninfective otitis externa, left ear: H60.502

## 2019-11-13 HISTORY — DX: Mixed conductive and sensorineural hearing loss, unilateral, right ear with restricted hearing on the contralateral side: H90.A31

## 2019-12-05 DIAGNOSIS — M1712 Unilateral primary osteoarthritis, left knee: Secondary | ICD-10-CM | POA: Diagnosis not present

## 2019-12-16 DIAGNOSIS — H9012 Conductive hearing loss, unilateral, left ear, with unrestricted hearing on the contralateral side: Secondary | ICD-10-CM | POA: Diagnosis not present

## 2019-12-16 DIAGNOSIS — H66002 Acute suppurative otitis media without spontaneous rupture of ear drum, left ear: Secondary | ICD-10-CM | POA: Diagnosis not present

## 2019-12-18 DIAGNOSIS — M48062 Spinal stenosis, lumbar region with neurogenic claudication: Secondary | ICD-10-CM | POA: Diagnosis not present

## 2019-12-19 DIAGNOSIS — E119 Type 2 diabetes mellitus without complications: Secondary | ICD-10-CM | POA: Diagnosis not present

## 2019-12-23 ENCOUNTER — Telehealth: Payer: Self-pay

## 2019-12-23 ENCOUNTER — Other Ambulatory Visit: Payer: Self-pay | Admitting: Neurosurgery

## 2019-12-23 DIAGNOSIS — M4802 Spinal stenosis, cervical region: Secondary | ICD-10-CM

## 2019-12-23 NOTE — Telephone Encounter (Signed)
Spoke with patient to review his medications and drug allergies before he can be scheduled for a cervical/lumbar myelogram.  He was informed he will be here two hours, will need a driver and will need to be on strict bedrest for 24 hours after the procedure.  He was also informed he will need to hold Trazodone for 48 hours before, and 24 hours after, the myelogram, even though he states he takes this medication only on occasion.

## 2019-12-27 ENCOUNTER — Ambulatory Visit
Admission: RE | Admit: 2019-12-27 | Discharge: 2019-12-27 | Disposition: A | Discharge status: Home / Self Care | Payer: BC Managed Care – PPO | Source: Ambulatory Visit | Attending: Neurosurgery | Admitting: Neurosurgery

## 2019-12-27 ENCOUNTER — Other Ambulatory Visit: Payer: Self-pay

## 2019-12-27 DIAGNOSIS — M4802 Spinal stenosis, cervical region: Secondary | ICD-10-CM | POA: Diagnosis not present

## 2019-12-27 DIAGNOSIS — M48061 Spinal stenosis, lumbar region without neurogenic claudication: Secondary | ICD-10-CM | POA: Diagnosis not present

## 2019-12-27 MED ORDER — MEPERIDINE HCL 50 MG/ML IJ SOLN
50.0000 mg | Freq: Once | INTRAMUSCULAR | Status: AC
  Administered 2019-12-27: 50 mg via INTRAMUSCULAR

## 2019-12-27 MED ORDER — ONDANSETRON HCL 4 MG/2ML IJ SOLN
4.0000 mg | Freq: Once | INTRAMUSCULAR | Status: AC
  Administered 2019-12-27: 4 mg via INTRAMUSCULAR

## 2019-12-27 MED ORDER — IOPAMIDOL (ISOVUE-M 300) INJECTION 61%
10.0000 mL | Freq: Once | INTRAMUSCULAR | Status: AC | PRN
  Administered 2019-12-27: 10 mL via INTRATHECAL

## 2019-12-27 MED ORDER — DIAZEPAM 5 MG PO TABS
5.0000 mg | ORAL_TABLET | Freq: Once | ORAL | Status: AC
  Administered 2019-12-27: 5 mg via ORAL

## 2019-12-27 NOTE — Discharge Instructions (Signed)

## 2019-12-31 DIAGNOSIS — M4802 Spinal stenosis, cervical region: Secondary | ICD-10-CM | POA: Diagnosis not present

## 2020-01-02 ENCOUNTER — Telehealth: Payer: Self-pay | Admitting: Cardiology

## 2020-01-02 NOTE — Telephone Encounter (Signed)
New message   Patient c/o Palpitations:  High priority if patient c/o lightheadedness, shortness of breath, or chest pain  1) How long have you had palpitations/irregular HR/ Afib? Are you having the symptoms now? Yes, per patient since last Friday  2) Are you currently experiencing lightheadedness, SOB or CP? Lightheaded, sob  Do you have a history of afib (atrial fibrillation) or irregular heart rhythm? Yes  3) Have you checked your BP or HR? (document readings if available): no  4) Are you experiencing any other symptoms? No

## 2020-01-02 NOTE — Telephone Encounter (Addendum)
Pt called to report that he had a spinal injection with "contrast" last Friday and ever since he has an irregular heart beat/ Afib... he says his rate at rest has been in the 60's but he says it goes up with minimal exertion... he has been dizzy and having some SOB with it.. pt declined being seen today but agreed to an appt tomorrow morning with Dr. Geraldo Pitter in [place] at 11:50am... he will consider calling back or the ER of anything worsens or changes throughout he day today.      COVID-19 Pre-Screening Questions:  . In the past 7 to 10 days have you had a cough,  shortness of breath, headache, congestion, fever (100 or greater) body aches, chills, sore throat, or sudden loss of taste or sense of smell? NO . Have you been around anyone with known Covid 19. NO . Have you been around anyone who is awaiting Covid 19 test results in the past 7 to 10 days? NO . Have you been around anyone who has been exposed to Covid 19, or has mentioned symptoms of Covid 19 within the past 7 to 10 days? NO  If you have any concerns/questions about symptoms patients report during screening (either on the phone or at threshold). Contact the provider seeing the patient or DOD for further guidance.  If neither are available contact a member of the leadership team.

## 2020-01-03 ENCOUNTER — Encounter: Payer: Self-pay | Admitting: Cardiology

## 2020-01-03 ENCOUNTER — Other Ambulatory Visit: Payer: Self-pay

## 2020-01-03 ENCOUNTER — Ambulatory Visit: Payer: BC Managed Care – PPO | Admitting: Cardiology

## 2020-01-03 VITALS — BP 128/80 | HR 91 | Temp 97.3°F | Resp 20 | Wt 188.2 lb

## 2020-01-03 DIAGNOSIS — I2510 Atherosclerotic heart disease of native coronary artery without angina pectoris: Secondary | ICD-10-CM | POA: Diagnosis not present

## 2020-01-03 DIAGNOSIS — E088 Diabetes mellitus due to underlying condition with unspecified complications: Secondary | ICD-10-CM

## 2020-01-03 DIAGNOSIS — I480 Paroxysmal atrial fibrillation: Secondary | ICD-10-CM

## 2020-01-03 DIAGNOSIS — I10 Essential (primary) hypertension: Secondary | ICD-10-CM | POA: Diagnosis not present

## 2020-01-03 DIAGNOSIS — Z01810 Encounter for preprocedural cardiovascular examination: Secondary | ICD-10-CM

## 2020-01-03 HISTORY — DX: Encounter for preprocedural cardiovascular examination: Z01.810

## 2020-01-03 MED ORDER — APIXABAN 5 MG PO TABS: 5.0000 mg | ORAL_TABLET | Freq: Two times a day (BID) | ORAL | 12 refills | Status: DC

## 2020-01-03 MED ORDER — METOPROLOL TARTRATE 50 MG PO TABS: 50.0000 mg | ORAL_TABLET | Freq: Every day | ORAL | 3 refills | Status: DC | PRN

## 2020-01-03 NOTE — Progress Notes (Signed)
Cardiology Office Note:    Date:  01/03/2020   ID:  Johnathan Murray, DOB 1951/05/12, MRN [id number]  PCP:  Nicoletta Dress, MD  Cardiologist:  Jenean Lindau, MD   Referring MD: Nicoletta Dress, MD    ASSESSMENT:    1. Coronary artery disease involving native coronary artery of native heart without angina pectoris   2. Essential hypertension   3. Diabetes mellitus due to underlying condition with unspecified complications ([place])   4. PAF (paroxysmal atrial fibrillation) ([facility])    PLAN:    In order of problems listed above:  1. Atrial fibrillation: I discussed my findings with the patient at extensive length.  This is a complex and challenging issue.  On one hand I would like him to initiate anticoagulation to make sure that I will try to get him into sinus rhythm in the next couple of weeks.  I discussed with the patient these issues and he vocalized understanding.  Benefits and potential is explained and he agreed.  I want to see if I can get him into sinus rhythm and ready for the surgery because keeping in in atrial fibrillation may been that his heart rate is not well controlled in the perioperative.  And this might be a problem for him.  We will check his stools for occult blood.  We will also give him metoprolol tartrate 50 mg tablet to be used on a as needed basis if he has rapid ventricular rates. 2. Preoperative risk stratification: I am not this we will get a Lexiscan sestamibi and an echocardiogram quickly in the next few days.  This will help assessing him a preoperative fashion for risk stratification. 3. Patient will be seen in follow-up appointment in 8 to 10 days and further recommendations will be made based on the findings of the aforementioned test.  Patient had multiple questions which were answered to his satisfaction.  Total time for this evaluation was 40 minutes.   Medication Adjustments/Labs and Tests Ordered: Current medicines are reviewed at length  with the patient today.  Concerns regarding medicines are outlined above.  No orders of the defined types were placed in this encounter.  No orders of the defined types were placed in this encounter.    Chief Complaint  Patient presents with  . Follow-up    pt c/o weakness and shortness of breath since A-fib has gotten worse.     History of Present Illness:    Johnathan Murray is a 69 y.o. male.  Patient has past medical history of coronary artery disease, essential hypertension, aortic valve replacement diabetes mellitus and paroxysmal atrial fibrillation.  He denies any problems at this time and takes care of activities of daily living.  No chest pain orthopnea or PND.  The patient has had significant orthopedic issues involving his spine.  He saw orthopedic surgeon and was found to be in atrial fibrillation on monitoring.  Therefore he was referred to [place].  At the time of my evaluation, the patient is alert awake oriented and in no distress.  Patient mentions to me that yesterday in the evening his heart rate went up to the 150s.  At that time he was stressed And anxious but subsequently was fine.  He is planning to undergo orthopedic surgery  Past Medical History:  Diagnosis Date  . Abnormal LFTs (liver function tests) 10/31/2016  . Acid reflux 02/15/2018  . Anemia    low iron  . Coronary artery disease involving native  coronary artery of native heart without angina pectoris 05/30/2016  . Diabetes mellitus due to underlying condition with unspecified complications ([place]) [id number]  . Dyslipidemia 05/30/2016  . Dyspnea    upon exertion,  as of 06/05/09 not having any recent chest pain  . Essential hypertension 05/30/2016  . Heart murmur    Has had an Aortic valve replacement  . Hyperactive gag reflex 02/15/2018  . Hypothyroidism   . Macular degeneration 02/15/2018  . PAF (paroxysmal atrial fibrillation) ([place]) 05/30/2016    Past Surgical History:  Procedure Laterality Date  .  ANTERIOR CERVICAL DECOMP/DISCECTOMY FUSION N/A 04/22/2019   Procedure: Anterior Cervical Discectomy and Fusion, Cervical four-five, Cervical five-six;  Surgeon: Earnie Larsson, MD;  Location: [place];  Service: Neurosurgery;  Laterality: N/A;  . AORTIC VALVE REPLACEMENT    . CARDIAC CATHETERIZATION     several stents  . CARDIAC SURGERY    . CORONARY ARTERY BYPASS GRAFT  2017   patient unsure how many, had at [facility]  . HAND SURGERY    . KNEE SURGERY    . LEFT HEART CATH AND CORS/GRAFTS ANGIOGRAPHY N/A 05/16/2018   Procedure: LEFT HEART CATH AND CORS/GRAFTS ANGIOGRAPHY;  Surgeon: Burnell Blanks, MD;  Location: [facility];  Service: Cardiovascular;  Laterality: N/A;  . LUMBAR LAMINECTOMY/DECOMPRESSION MICRODISCECTOMY Bilateral 06/11/2019   Procedure: Bilateral Lumbar Four-FiveLaminectomy/Foraminotomy;  Surgeon: Earnie Larsson, MD;  Location: [facility];  Service: Neurosurgery;  Laterality: Bilateral;  posterior    Current Medications: Current Meds  Medication Sig  . amLODipine (NORVASC) 10 MG tablet Take 10 mg by mouth at bedtime.   Marland Kitchen aspirin EC 81 MG tablet Take 325 mg by mouth daily.   . benazepril (LOTENSIN) 40 MG tablet Take 40 mg by mouth at bedtime.   . Coenzyme Q10 (COQ10) 200 MG CAPS Take by mouth.  . Cyanocobalamin (B-12) 5000 MCG CAPS Take 5,000 mcg by mouth daily.   Marland Kitchen ezetimibe (ZETIA) 10 MG tablet TAKE 1 TABLET BY MOUTH EVERY DAY  . famotidine (PEPCID) 20 MG tablet   . furosemide (LASIX) 40 MG tablet Take 40 mg by mouth daily.  Marland Kitchen HYDROcodone-acetaminophen (NORCO/VICODIN) 5-325 MG tablet Take 1 tablet by mouth every 4 (four) hours as needed for moderate pain ((score 4 to 6)).  Marland Kitchen levothyroxine (SYNTHROID, LEVOTHROID) 50 MCG tablet Take 50 mcg by mouth at bedtime.   . metFORMIN (GLUCOPHAGE) 500 MG tablet Take 500 mg by mouth at bedtime.   . nitroGLYCERIN (NITROSTAT) 0.4 MG SL tablet Place 0.4 mg under the tongue every 5 (five) minutes as needed for chest pain.     . sildenafil (VIAGRA) 100 MG tablet Take 50 mg by mouth as needed for erectile dysfunction.   . traZODone (DESYREL) 100 MG tablet TAKE 1 TABLET BY MOUTH EVERYDAY AT BEDTIME  . vitamin C (ASCORBIC ACID) 500 MG tablet Take 500 mg by mouth daily.   Marland Kitchen zinc gluconate 50 MG tablet Take 50 mg by mouth daily.     Allergies:   Atorvastatin, Metoprolol, and Nebivolol   Social History   Socioeconomic History  . Marital status: Married    Spouse name: [name]  . Number of children: 1  . Years of education: Not on file  . Highest education level: GED or equivalent  Occupational History    Employer: [employer]  Tobacco Use  . Smoking status: Never Smoker  . Smokeless tobacco: Never Used  Substance and Sexual Activity  . Alcohol use: Yes  Alcohol/week: 2.0 - 3.0 standard drinks    Types: 2 - 3 Cans of beer per week  . Drug use: No  . Sexual activity: Not on file  Other Topics Concern  . Not on file  Social History Narrative   He drives a truck for [employer].   He lives with wife in a one-level home.   Highest level of education:  GED      Patient is right-handed. There are 8 steps to enter his home.   Social Determinants of Health   Financial Resource Strain:   . Difficulty of Paying Living Expenses: Not on file  Food Insecurity:   . Worried About Charity fundraiser in the Last Year: Not on file  . Ran Out of Food in the Last Year: Not on file  Transportation Needs:   . Lack of Transportation (Medical): Not on file  . Lack of Transportation (Non-Medical): Not on file  Physical Activity:   . Days of Exercise per Week: Not on file  . Minutes of Exercise per Session: Not on file  Stress:   . Feeling of Stress : Not on file  Social Connections:   . Frequency of Communication with Friends and Family: Not on file  . Frequency of Social Gatherings with Friends and Family: Not on file  . Attends Religious Services: Not on file  . Active Member of Clubs or  Organizations: Not on file  . Attends Archivist Meetings: Not on file  . Marital Status: Not on file     Family History: The patient's family history includes Brain cancer in his mother; Heart disease in his maternal grandmother; Prostate cancer in his brother.  ROS:   Please see the history of present illness.    All other systems reviewed and are negative.  EKGs/Labs/Other Studies Reviewed:    The following studies were reviewed today: EKG reveals atrial fibrillation with well-controlled ventricular rate.   Recent Labs: 06/06/2019: BUN 11; Potassium 3.7; Sodium 137 06/11/2019: Creatinine, Ser 1.63; Hemoglobin 13.6; Platelets 228  Recent Lipid Panel    Component Value Date/Time   CHOL 217 (H) 05/21/2018 1130   TRIG 61 05/21/2018 1130   HDL 100 05/21/2018 1130   CHOLHDL 2.2 05/21/2018 1130   LDLCALC 105 (H) 05/21/2018 1130    Physical Exam:    VS:  BP 128/80 (BP Location: Left Arm, Patient Position: Sitting, Cuff Size: Normal)   Pulse 91   Temp (!) 97.3 F (36.3 C)   Resp 20   Wt 188 lb 4 oz (85.4 kg)   SpO2 98%   BMI 27.01 kg/m     Wt Readings from Last 3 Encounters:  01/03/20 188 lb 4 oz (85.4 kg)  07/26/19 196 lb 3.2 oz (89 kg)  06/11/19 185 lb (83.9 kg)     GEN: Patient is in no acute distress HEENT: Normal NECK: No JVD; No carotid bruits LYMPHATICS: No lymphadenopathy CARDIAC: Hear sounds regular, 2/6 systolic murmur at the apex. RESPIRATORY:  Clear to auscultation without rales, wheezing or rhonchi  ABDOMEN: Soft, non-tender, non-distended MUSCULOSKELETAL:  No edema; No deformity  SKIN: Warm and dry NEUROLOGIC:  Alert and oriented x 3 PSYCHIATRIC:  Normal affect   Signed, Jenean Lindau, MD  01/03/2020 12:25 PM    [facility]

## 2020-01-03 NOTE — Patient Instructions (Signed)
Medication Instructions:  Your physician has recommended you make the following change in your medication:  You need to start Eliquis 5 mg twice daily. Take Metoprolol tartrate 50 mg as needed *If you need a refill on your cardiac medications before your next appointment, please call your pharmacy*  Lab Work: None ordered If you have labs (blood work) drawn today and your tests are completely normal, you will receive your results only by: Marland Kitchen MyChart Message (if you have MyChart) OR . A paper copy in the mail If you have any lab test that is abnormal or we need to change your treatment, we will call you to review the results.  Testing/Procedures: Your physician has requested that you have an echocardiogram. Echocardiography is a painless test that uses sound waves to create images of your heart. It provides your doctor with information about the size and shape of your heart and how well your heart's chambers and valves are working. This procedure takes approximately one hour. There are no restrictions for this procedure.  Your physician has requested that you have a lexiscan myoview. For further information please visit [email or web address]. Please follow instruction sheet, as given.    Follow-Up: At [facility], you and your health needs are our priority.  As part of our continuing mission to provide you with exceptional heart care, we have created designated Provider Care Teams.  These Care Teams include your primary Cardiologist (physician) and Advanced Practice Providers (APPs -  Physician Assistants and Nurse Practitioners) who all work together to provide you with the care you need, when you need it.  Your next appointment:   8-10 day(s)  The format for your next appointment:   In Person  Provider:   Jyl Heinz, MD  Other Instructions  Echocardiogram An echocardiogram is a procedure that uses painless sound waves (ultrasound) to produce an image of the heart. Images from  an echocardiogram can provide important information about:  Signs of coronary artery disease (CAD).  Aneurysm detection. An aneurysm is a weak or damaged part of an artery wall that bulges out from the normal force of blood pumping through the body.  Heart size and shape. Changes in the size or shape of the heart can be associated with certain conditions, including heart failure, aneurysm, and CAD.  Heart muscle function.  Heart valve function.  Signs of a past heart attack.  Fluid buildup around the heart.  Thickening of the heart muscle.  A tumor or infectious growth around the heart valves. Tell a health care provider about:  Any allergies you have.  All medicines you are taking, including vitamins, herbs, eye drops, creams, and over-the-counter medicines.  Any blood disorders you have.  Any surgeries you have had.  Any medical conditions you have.  Whether you are pregnant or may be pregnant. What are the risks? Generally, this is a safe procedure. However, problems may occur, including:  Allergic reaction to dye (contrast) that may be used during the procedure. What happens before the procedure? No specific preparation is needed. You may eat and drink normally. What happens during the procedure?   An IV tube may be inserted into one of your veins.  You may receive contrast through this tube. A contrast is an injection that improves the quality of the pictures from your heart.  A gel will be applied to your chest.  A wand-like tool (transducer) will be moved over your chest. The gel will help to transmit the sound waves from the transducer.  The sound waves will harmlessly bounce off of your heart to allow the heart images to be captured in real-time motion. The images will be recorded on a computer. The procedure may vary among health care providers and hospitals. What happens after the procedure?  You may return to your normal, everyday life, including diet,  activities, and medicines, unless your health care provider tells you not to do that. Summary  An echocardiogram is a procedure that uses painless sound waves (ultrasound) to produce an image of the heart.  Images from an echocardiogram can provide important information about the size and shape of your heart, heart muscle function, heart valve function, and fluid buildup around your heart.  You do not need to do anything to prepare before this procedure. You may eat and drink normally.  After the echocardiogram is completed, you may return to your normal, everyday life, unless your health care provider tells you not to do that. This information is not intended to replace advice given to you by your health care provider. Make sure you discuss any questions you have with your health care provider. Document Revised: 02/28/2019 Document Reviewed: 12/10/2016 Elsevier Patient Education  [place].

## 2020-01-08 ENCOUNTER — Telehealth (HOSPITAL_COMMUNITY): Payer: Self-pay

## 2020-01-08 DIAGNOSIS — I10 Essential (primary) hypertension: Secondary | ICD-10-CM | POA: Diagnosis not present

## 2020-01-08 DIAGNOSIS — I2510 Atherosclerotic heart disease of native coronary artery without angina pectoris: Secondary | ICD-10-CM | POA: Diagnosis not present

## 2020-01-08 DIAGNOSIS — I480 Paroxysmal atrial fibrillation: Secondary | ICD-10-CM | POA: Diagnosis not present

## 2020-01-08 DIAGNOSIS — E088 Diabetes mellitus due to underlying condition with unspecified complications: Secondary | ICD-10-CM | POA: Diagnosis not present

## 2020-01-08 NOTE — Telephone Encounter (Signed)
Encounter complete. 

## 2020-01-09 ENCOUNTER — Other Ambulatory Visit (HOSPITAL_COMMUNITY): Payer: BC Managed Care – PPO

## 2020-01-09 LAB — FECAL OCCULT BLOOD, IMMUNOCHEMICAL: Fecal Occult Bld: NEGATIVE

## 2020-01-10 ENCOUNTER — Ambulatory Visit (HOSPITAL_COMMUNITY)
Admission: RE | Admit: 2020-01-10 | Discharge: 2020-01-10 | Disposition: A | Discharge status: Home / Self Care | Payer: BC Managed Care – PPO | Source: Ambulatory Visit | Attending: Cardiology | Admitting: Cardiology

## 2020-01-10 ENCOUNTER — Other Ambulatory Visit: Payer: Self-pay

## 2020-01-10 DIAGNOSIS — I480 Paroxysmal atrial fibrillation: Secondary | ICD-10-CM | POA: Diagnosis not present

## 2020-01-10 DIAGNOSIS — Z01810 Encounter for preprocedural cardiovascular examination: Secondary | ICD-10-CM | POA: Diagnosis not present

## 2020-01-10 DIAGNOSIS — E088 Diabetes mellitus due to underlying condition with unspecified complications: Secondary | ICD-10-CM

## 2020-01-10 DIAGNOSIS — I10 Essential (primary) hypertension: Secondary | ICD-10-CM

## 2020-01-10 DIAGNOSIS — I2510 Atherosclerotic heart disease of native coronary artery without angina pectoris: Secondary | ICD-10-CM

## 2020-01-10 LAB — MYOCARDIAL PERFUSION IMAGING
LV dias vol: 122 mL (ref 62–150)
LV sys vol: 62 mL
Peak HR: 92 {beats}/min
Rest HR: 74 {beats}/min
SDS: 3
SRS: 3
SSS: 6
TID: 1.11

## 2020-01-10 MED ORDER — REGADENOSON 0.4 MG/5ML IV SOLN
0.4000 mg | Freq: Once | INTRAVENOUS | Status: AC
  Administered 2020-01-10: 0.4 mg via INTRAVENOUS

## 2020-01-10 MED ORDER — TECHNETIUM TC 99M TETROFOSMIN IV KIT
30.2000 | PACK | Freq: Once | INTRAVENOUS | Status: AC | PRN
  Administered 2020-01-10: 30.2 via INTRAVENOUS
  Filled 2020-01-10: qty 31

## 2020-01-10 MED ORDER — TECHNETIUM TC 99M TETROFOSMIN IV KIT
11.0000 | PACK | Freq: Once | INTRAVENOUS | Status: AC | PRN
  Administered 2020-01-10: 11 via INTRAVENOUS
  Filled 2020-01-10: qty 11

## 2020-01-13 ENCOUNTER — Other Ambulatory Visit: Payer: Self-pay

## 2020-01-13 MED ORDER — NITROGLYCERIN 0.4 MG SL SUBL: 0.4000 mg | SUBLINGUAL_TABLET | SUBLINGUAL | 6 refills | Status: DC | PRN

## 2020-01-16 ENCOUNTER — Ambulatory Visit (HOSPITAL_COMMUNITY): Payer: BC Managed Care – PPO | Attending: Cardiovascular Disease

## 2020-01-16 ENCOUNTER — Other Ambulatory Visit: Payer: Self-pay

## 2020-01-16 DIAGNOSIS — I480 Paroxysmal atrial fibrillation: Secondary | ICD-10-CM | POA: Diagnosis not present

## 2020-01-16 DIAGNOSIS — I2510 Atherosclerotic heart disease of native coronary artery without angina pectoris: Secondary | ICD-10-CM | POA: Diagnosis not present

## 2020-01-16 DIAGNOSIS — E088 Diabetes mellitus due to underlying condition with unspecified complications: Secondary | ICD-10-CM | POA: Diagnosis not present

## 2020-01-16 DIAGNOSIS — Z01810 Encounter for preprocedural cardiovascular examination: Secondary | ICD-10-CM | POA: Insufficient documentation

## 2020-01-16 DIAGNOSIS — I10 Essential (primary) hypertension: Secondary | ICD-10-CM | POA: Insufficient documentation

## 2020-01-16 MED ORDER — PERFLUTREN LIPID MICROSPHERE
1.0000 mL | INTRAVENOUS | Status: AC | PRN
  Administered 2020-01-16: 1 mL via INTRAVENOUS

## 2020-01-17 ENCOUNTER — Ambulatory Visit: Payer: BC Managed Care – PPO | Admitting: Cardiology

## 2020-01-17 ENCOUNTER — Encounter: Payer: Self-pay | Admitting: Cardiology

## 2020-01-17 VITALS — BP 140/90 | HR 86 | Ht 70.0 in | Wt 192.0 lb

## 2020-01-17 DIAGNOSIS — R9439 Abnormal result of other cardiovascular function study: Secondary | ICD-10-CM

## 2020-01-17 DIAGNOSIS — E088 Diabetes mellitus due to underlying condition with unspecified complications: Secondary | ICD-10-CM

## 2020-01-17 DIAGNOSIS — Z01810 Encounter for preprocedural cardiovascular examination: Secondary | ICD-10-CM

## 2020-01-17 DIAGNOSIS — I2510 Atherosclerotic heart disease of native coronary artery without angina pectoris: Secondary | ICD-10-CM | POA: Diagnosis not present

## 2020-01-17 DIAGNOSIS — I10 Essential (primary) hypertension: Secondary | ICD-10-CM | POA: Diagnosis not present

## 2020-01-17 DIAGNOSIS — N289 Disorder of kidney and ureter, unspecified: Secondary | ICD-10-CM

## 2020-01-17 DIAGNOSIS — Z952 Presence of prosthetic heart valve: Secondary | ICD-10-CM

## 2020-01-17 DIAGNOSIS — I350 Nonrheumatic aortic (valve) stenosis: Secondary | ICD-10-CM | POA: Diagnosis not present

## 2020-01-17 DIAGNOSIS — I480 Paroxysmal atrial fibrillation: Secondary | ICD-10-CM

## 2020-01-17 DIAGNOSIS — E785 Hyperlipidemia, unspecified: Secondary | ICD-10-CM

## 2020-01-17 DIAGNOSIS — I25118 Atherosclerotic heart disease of native coronary artery with other forms of angina pectoris: Secondary | ICD-10-CM

## 2020-01-17 HISTORY — DX: Disorder of kidney and ureter, unspecified: N28.9

## 2020-01-17 HISTORY — DX: Presence of prosthetic heart valve: Z95.2

## 2020-01-17 NOTE — Patient Instructions (Signed)
Medication Instructions:  No medication changes *If you need a refill on your cardiac medications before your next appointment, please call your pharmacy*   Lab Work: You had a cbc and BMET today. If you have labs (blood work) drawn today and your tests are completely normal, you will receive your results only by: Marland Kitchen MyChart Message (if you have MyChart) OR . A paper copy in the mail If you have any lab test that is abnormal or we need to change your treatment, we will call you to review the results.   Testing/Procedures:    [place] [address] Dept: [phone] Loc: [phone]  Johnathan Murray  01/17/2020  You are scheduled for a Cardiac Catheterization on Tuesday, March 2 with Dr. Daneen Schick.  1. Please arrive at the [facility] (Main Entrance A) at [facility]: [address] [place], [place] [address] at 10:00 AM (This time is two hours before your procedure to ensure your preparation). Free valet parking service is available.   Special note: Every effort is made to have your procedure done on time. Please understand that emergencies sometimes delay scheduled procedures.  2. Diet: Do not eat solid foods after midnight.  The patient may have clear liquids until 5am upon the day of the procedure.  3. Labs: You had labs done today.  4. Medication instructions in preparation for your procedure:   Contrast Allergy: No   Stop taking Eliquis (Apixiban) on Sunday, February 28.  Stop taking, Advil or Motrin (Ibuprofen) Sunday, February 28,  Do not take Diabetes Med Glucophage (Metformin) on the day of the procedure and HOLD 48 HOURS AFTER THE PROCEDURE.  On the morning of your procedure, take your Aspirin and any morning medicines NOT listed above.  You may use sips of water.  5. Plan for one night stay--bring personal belongings. 6. Bring a current  list of your medications and current insurance cards. 7. You MUST have a responsible person to drive you home. 8. Someone MUST be with you the first 24 hours after you arrive home or your discharge will be delayed. 9. Please wear clothes that are easy to get on and off and wear slip-on shoes.  Thank you for allowing [place] to care for you!   -- [facility]   Follow-Up: At [facility], you and your health needs are our priority.  As part of our continuing mission to provide you with exceptional heart care, we have created designated Provider Care Teams.  These Care Teams include your primary Cardiologist (physician) and Advanced Practice Providers (APPs -  Physician Assistants and Nurse Practitioners) who all work together to provide you with the care you need, when you need it.  We recommend signing up for the patient portal called "MyChart".  Sign up information is provided on this After Visit Summary.  MyChart is used to connect with patients for Virtual Visits (Telemedicine).  Patients are able to view lab/test results, encounter notes, upcoming appointments, etc.  Non-urgent messages can be sent to your provider as well.   To learn more about what you can do with MyChart, go to [email or web address].    Your next appointment:   2 week(s)  The format for your next appointment:   In Person  Provider:   Jyl Heinz, MD   Other Instructions NA

## 2020-01-17 NOTE — Progress Notes (Signed)
Cardiology Office Note:    Date:  01/17/2020   ID:  Johnathan Murray, DOB [birth year], MRN [id number]  PCP:  Nicoletta Dress, MD  Cardiologist:  Jenean Lindau, MD   Referring MD: Nicoletta Dress, MD    ASSESSMENT:    1. Preoperative cardiovascular examination   2. Coronary artery disease involving native coronary artery of native heart without angina pectoris   3. Essential hypertension   4. PAF (paroxysmal atrial fibrillation) ([place])   5. Aortic valve stenosis, etiology of cardiac valve disease unspecified   6. Coronary artery disease of native artery of native heart with stable angina pectoris ([place])   7. Diabetes mellitus due to underlying condition with unspecified complications ([place])   8. Dyslipidemia   9. Abnormal nuclear stress test    PLAN:    In order of problems listed above:  1. Abnormal nuclear stress test: I discussed my findings with the patient at extensive length.  In view of major surgery I think she needs to be well evaluated with coronary angiography.  He vocalized understanding.  Benefits and potential risks explained to the patient at extensive length.  This is especially in view of the fact that he has renal insufficiency.  He vocalized understanding.  I am not sure whether there is any benefit in doing left ventriculography left heart catheterization in this gentleman in view of renal insufficiency and also prosthetic aortic valve.  Coronary angiography should suffice however I would leave this to be finally evaluation of my interventional colleague.  He needs to be well-hydrated and we need to follow protocol with renal insufficiency for him and we will convey that message to the cath.  Further recommendations will be made based on the findings of the coronary angiography.  Again he plans to have neck surgery if this test goes fine.  I told him that should he need any form of intervention he is surgery may have to be postponed and he understands  this. 2. Paroxysmal atrial fibrillation:I discussed with the patient atrial fibrillation, disease process. Management and therapy including rate and rhythm control, anticoagulation benefits and potential risks were discussed extensively with the patient. Patient had multiple questions which were answered to patient's satisfaction. 3. Coronary artery disease: As mentioned above 4. He will be seen in follow-up appointment after the above test.  Patient had multiple questions which were answered to his satisfaction.   Medication Adjustments/Labs and Tests Ordered: Current medicines are reviewed at length with the patient today.  Concerns regarding medicines are outlined above.  No orders of the defined types were placed in this encounter.  No orders of the defined types were placed in this encounter.    Chief Complaint  Patient presents with  . Follow-up     History of Present Illness:    Johnathan Murray is a 69 y.o. male.  Patient has past medical history of coronary artery disease post aortic valve replacement, essential hypertension, dyslipidemia and approximately fibrillation.  He is planning to undergo surgery on his neck for neurological issues.  He is here for preop assessment and his stress test was abnormal.  Since this is a significant surgery and will need to make sure that he is stable from a cardiovascular aspect I called him to discuss this for follow-up.  He denies any chest pain orthopnea or PND but leads a sedentary lifestyle.  At the time of my evaluation, the patient is alert awake oriented and in no distress.   Past  Medical History:  Diagnosis Date  . Abnormal LFTs (liver function tests) 10/31/2016  . Acid reflux 02/15/2018  . Anemia    low iron  . Coronary artery disease involving native coronary artery of native heart without angina pectoris 05/30/2016  . Diabetes mellitus due to underlying condition with unspecified complications ([place]) [id number]  . Dyslipidemia  05/30/2016  . Dyspnea    upon exertion,  as of 06/05/09 not having any recent chest pain  . Essential hypertension 05/30/2016  . Heart murmur    Has had an Aortic valve replacement  . Hyperactive gag reflex 02/15/2018  . Hypothyroidism   . Macular degeneration 02/15/2018  . PAF (paroxysmal atrial fibrillation) ([place]) 05/30/2016    Past Surgical History:  Procedure Laterality Date  . ANTERIOR CERVICAL DECOMP/DISCECTOMY FUSION N/A 04/22/2019   Procedure: Anterior Cervical Discectomy and Fusion, Cervical four-five, Cervical five-six;  Surgeon: Earnie Larsson, MD;  Location: [facility];  Service: Neurosurgery;  Laterality: N/A;  . AORTIC VALVE REPLACEMENT    . CARDIAC CATHETERIZATION     several stents  . CARDIAC SURGERY    . CORONARY ARTERY BYPASS GRAFT  2017   patient unsure how many, had at [facility]  . HAND SURGERY    . KNEE SURGERY    . LEFT HEART CATH AND CORS/GRAFTS ANGIOGRAPHY N/A 05/16/2018   Procedure: LEFT HEART CATH AND CORS/GRAFTS ANGIOGRAPHY;  Surgeon: Burnell Blanks, MD;  Location: [facility];  Service: Cardiovascular;  Laterality: N/A;  . LUMBAR LAMINECTOMY/DECOMPRESSION MICRODISCECTOMY Bilateral 06/11/2019   Procedure: Bilateral Lumbar Four-FiveLaminectomy/Foraminotomy;  Surgeon: Earnie Larsson, MD;  Location: [facility];  Service: Neurosurgery;  Laterality: Bilateral;  posterior    Current Medications: Current Meds  Medication Sig  . amLODipine (NORVASC) 10 MG tablet Take 10 mg by mouth at bedtime.   Marland Kitchen apixaban (ELIQUIS) 5 MG TABS tablet Take 1 tablet (5 mg total) by mouth 2 (two) times daily.  Marland Kitchen aspirin EC 81 MG tablet Take 325 mg by mouth daily.   . benazepril (LOTENSIN) 40 MG tablet Take 40 mg by mouth at bedtime.   . Coenzyme Q10 (COQ10) 200 MG CAPS Take by mouth.  . Cyanocobalamin (B-12) 5000 MCG CAPS Take 5,000 mcg by mouth daily.   . cyclobenzaprine (FLEXERIL) 10 MG tablet Take 1 tablet (10 mg total) by mouth at bedtime.  Marland Kitchen ezetimibe (ZETIA) 10 MG  tablet TAKE 1 TABLET BY MOUTH EVERY DAY  . famotidine (PEPCID) 20 MG tablet   . furosemide (LASIX) 40 MG tablet Take 40 mg by mouth daily.  Marland Kitchen gabapentin (NEURONTIN) 600 MG tablet Take 600 mg by mouth 2 (two) times daily.  Marland Kitchen HYDROcodone-acetaminophen (NORCO/VICODIN) 5-325 MG tablet Take 1 tablet by mouth every 4 (four) hours as needed for moderate pain ((score 4 to 6)).  Marland Kitchen ibuprofen (ADVIL) 800 MG tablet TAKE 1 TABLET BY ORAL ROUTE 3 TIMES EVERY AS NEEDED FOR PAIN  . levothyroxine (SYNTHROID, LEVOTHROID) 50 MCG tablet Take 50 mcg by mouth at bedtime.   . meloxicam (MOBIC) 15 MG tablet Take 7.5 mg by mouth 2 (two) times daily.  . metFORMIN (GLUCOPHAGE) 500 MG tablet Take 500 mg by mouth at bedtime.   . metoprolol tartrate (LOPRESSOR) 50 MG tablet Take 1 tablet (50 mg total) by mouth daily as needed.  . nitroGLYCERIN (NITROSTAT) 0.4 MG SL tablet Place 1 tablet (0.4 mg total) under the tongue every 5 (five) minutes as needed for chest pain.  . sildenafil (VIAGRA) 100 MG tablet Take 50  mg by mouth as needed for erectile dysfunction.   . traZODone (DESYREL) 100 MG tablet TAKE 1 TABLET BY MOUTH EVERYDAY AT BEDTIME  . vitamin C (ASCORBIC ACID) 500 MG tablet Take 500 mg by mouth daily.   Marland Kitchen zinc gluconate 50 MG tablet Take 50 mg by mouth daily.     Allergies:   Atorvastatin, Metoprolol, and Nebivolol   Social History   Socioeconomic History  . Marital status: Married    Spouse name: [name]  . Number of children: 1  . Years of education: Not on file  . Highest education level: GED or equivalent  Occupational History    Employer: [employer]  Tobacco Use  . Smoking status: Never Smoker  . Smokeless tobacco: Never Used  Substance and Sexual Activity  . Alcohol use: Yes    Alcohol/week: 2.0 - 3.0 standard drinks    Types: 2 - 3 Cans of beer per week  . Drug use: No  . Sexual activity: Not on file  Other Topics Concern  . Not on file  Social History Narrative   He drives a truck  for [employer].   He lives with wife in a one-level home.   Highest level of education:  GED      Patient is right-handed. There are 8 steps to enter his home.   Social Determinants of Health   Financial Resource Strain:   . Difficulty of Paying Living Expenses: Not on file  Food Insecurity:   . Worried About Charity fundraiser in the Last Year: Not on file  . Ran Out of Food in the Last Year: Not on file  Transportation Needs:   . Lack of Transportation (Medical): Not on file  . Lack of Transportation (Non-Medical): Not on file  Physical Activity:   . Days of Exercise per Week: Not on file  . Minutes of Exercise per Session: Not on file  Stress:   . Feeling of Stress : Not on file  Social Connections:   . Frequency of Communication with Friends and Family: Not on file  . Frequency of Social Gatherings with Friends and Family: Not on file  . Attends Religious Services: Not on file  . Active Member of Clubs or Organizations: Not on file  . Attends Archivist Meetings: Not on file  . Marital Status: Not on file     Family History: The patient's family history includes Brain cancer in his mother; Heart disease in his maternal grandmother; Prostate cancer in his brother.  ROS:   Please see the history of present illness.    All other systems reviewed and are negative.  EKGs/Labs/Other Studies Reviewed:    The following studies were reviewed today: IMPRESSIONS    1. Left ventricular ejection fraction, by estimation, is 55 to 60%. The  left ventricle has normal function. The left ventricle has no regional  wall motion abnormalities. Left ventricular diastolic function could not  be evaluated. Left ventricular  diastolic function could not be evaluated.  2. Right ventricular systolic function is normal. The right ventricular  size is normal. There is normal pulmonary artery systolic pressure. The  estimated right ventricular systolic pressure is [id number]  mmHg.  3. The mitral valve is degenerative. Mild to moderate mitral valve  regurgitation.  4. 23 mm bioprosthetic AoV. Vmax 2.9 m/s, mean gradient 16 mmHG, EOA 1.46  cm2, DI 0.46. Gradients within normal limits. The aortic valve has been  repaired/replaced. Aortic valve regurgitation is not visualized.  Mild  aortic valve stenosis. There is a 23  mm Edwards tissue valve present in the aortic position. Procedure Date:  08/16/16. Echo findings are consistent with normal structure and function  of the aortic valve prosthesis.  5. The inferior vena cava is normal in size with greater than 50%  respiratory variability, suggesting right atrial pressure of 3 mmHg.   Comparison(s): A prior study was performed on 04/25/2018. Gradients across  bioprosthetic AoV within normal limits on this study.    Study Highlights    The left ventricular ejection fraction is mildly decreased (45-54%).  Nuclear stress EF: 49%.  There was no ST segment deviation noted during stress.  Defect 1: There is a small defect of mild severity present in the basal anterolateral and mid anterolateral location.  Findings consistent with ischemia.  This is a low risk study.   Low risk stress nuclear study with mild anterolateral ischemia (possibly the distribution of a diagonal artery) and mildly reduced left ventricular systolic function.       Recent Labs: 06/06/2019: BUN 11; Potassium 3.7; Sodium 137 06/11/2019: Creatinine, Ser 1.63; Hemoglobin 13.6; Platelets 228  Recent Lipid Panel    Component Value Date/Time   CHOL 217 (H) 05/21/2018 1130   TRIG 61 05/21/2018 1130   HDL 100 05/21/2018 1130   CHOLHDL 2.2 05/21/2018 1130   LDLCALC 105 (H) 05/21/2018 1130    Physical Exam:    VS:  BP 140/90   Pulse 86   Ht 5\' 10"  (1.778 m)   Wt 192 lb (87.1 kg)   SpO2 99%   BMI 27.55 kg/m     Wt Readings from Last 3 Encounters:  01/17/20 192 lb (87.1 kg)  01/10/20 188 lb (85.3 kg)  01/03/20 188 lb 4 oz  (85.4 kg)     GEN: Patient is in no acute distress HEENT: Normal NECK: No JVD; No carotid bruits LYMPHATICS: No lymphadenopathy CARDIAC: Hear sounds regular, 2/6 systolic murmur at the apex. RESPIRATORY:  Clear to auscultation without rales, wheezing or rhonchi  ABDOMEN: Soft, non-tender, non-distended MUSCULOSKELETAL:  No edema; No deformity  SKIN: Warm and dry NEUROLOGIC:  Alert and oriented x 3 PSYCHIATRIC:  Normal affect   Signed, Jenean Lindau, MD  01/17/2020 2:51 PM     [facility]

## 2020-01-18 ENCOUNTER — Other Ambulatory Visit (HOSPITAL_COMMUNITY)
Admission: RE | Admit: 2020-01-18 | Discharge: 2020-01-18 | Disposition: A | Discharge status: Home / Self Care | Payer: BC Managed Care – PPO | Source: Ambulatory Visit | Attending: Interventional Cardiology | Admitting: Interventional Cardiology

## 2020-01-18 DIAGNOSIS — Z20822 Contact with and (suspected) exposure to covid-19: Secondary | ICD-10-CM | POA: Diagnosis not present

## 2020-01-18 DIAGNOSIS — Z01812 Encounter for preprocedural laboratory examination: Secondary | ICD-10-CM | POA: Diagnosis not present

## 2020-01-18 LAB — BASIC METABOLIC PANEL
BUN/Creatinine Ratio: 13 (ref 10–24)
BUN: 17 mg/dL (ref 8–27)
CO2: 23 mmol/L (ref 20–29)
Calcium: 9.8 mg/dL (ref 8.6–10.2)
Chloride: 107 mmol/L — ABNORMAL HIGH (ref 96–106)
Creatinine, Ser: 1.31 mg/dL — ABNORMAL HIGH (ref 0.76–1.27)
GFR calc Af Amer: 65 mL/min/{1.73_m2} (ref >59)
GFR calc non Af Amer: 56 mL/min/{1.73_m2} — ABNORMAL LOW (ref >59)
Glucose: 149 mg/dL — ABNORMAL HIGH (ref 65–99)
Potassium: 4.7 mmol/L (ref 3.5–5.2)
Sodium: 143 mmol/L (ref 134–144)

## 2020-01-18 LAB — CBC WITH DIFFERENTIAL/PLATELET
Basophils Absolute: 0 10*3/uL (ref 0.0–0.2)
Basos: 1 %
EOS (ABSOLUTE): 0.1 10*3/uL (ref 0.0–0.4)
Eos: 2 %
Hematocrit: 37.6 % (ref 37.5–51.0)
Hemoglobin: 13.2 g/dL (ref 13.0–17.7)
Immature Grans (Abs): 0 10*3/uL (ref 0.0–0.1)
Immature Granulocytes: 0 %
Lymphocytes Absolute: 1.8 10*3/uL (ref 0.7–3.1)
Lymphs: 29 %
MCH: 32.8 pg (ref 26.6–33.0)
MCHC: 35.1 g/dL (ref 31.5–35.7)
MCV: 94 fL (ref 79–97)
Monocytes Absolute: 0.5 10*3/uL (ref 0.1–0.9)
Monocytes: 8 %
Neutrophils Absolute: 3.7 10*3/uL (ref 1.4–7.0)
Neutrophils: 60 %
Platelets: 221 10*3/uL (ref 150–450)
RBC: 4.02 x10E6/uL — ABNORMAL LOW (ref 4.14–5.80)
RDW: 12.3 % (ref 11.6–15.4)
WBC: 6.2 10*3/uL (ref 3.4–10.8)

## 2020-01-18 LAB — SARS CORONAVIRUS 2 (TAT 6-24 HRS): SARS Coronavirus 2: NEGATIVE

## 2020-01-20 ENCOUNTER — Other Ambulatory Visit: Payer: Self-pay

## 2020-01-20 ENCOUNTER — Telehealth: Payer: Self-pay | Admitting: *Deleted

## 2020-01-20 DIAGNOSIS — I2510 Atherosclerotic heart disease of native coronary artery without angina pectoris: Secondary | ICD-10-CM

## 2020-01-20 NOTE — Telephone Encounter (Addendum)
Pt contacted pre-catheterization scheduled at [facility] for: Tuesday January 21, 2020 12 Noon Verified arrival time and place: [facility]) at: 10 AM   No solid food after midnight prior to cath, clear liquids until 5 AM day of procedure. Contrast allergy: no  Hold: Lasix-day before and day of procedure-GFR 56-pt already taken today Mobic-day before and day of procedure-GFR 56-pt states not currently taking Ibuprofen-day before and day of procedure-GFR 56-pt states not currently taking Benazepril-day before and day of procedure-GFR 56 Eliquis-none 01/19/20 until post procedure. Metformin-day of procedure and 48 hours post procedure. Sildenafil-until post procedure  Except hold medications AM meds can be  taken pre-cath with sip of water including: ASA 81 mg   Confirmed patient has responsible adult to drive home post procedure and observe 24 hours after arriving home: yes  Currently, due to Covid-19 pandemic, only one person will be allowed with patient. Must be the same person for patient's entire stay and will be required to wear a mask. They will be asked to wait in the waiting room for the duration of the patient's stay.  Patients are required to wear a mask when they enter the hospital.      COVID-19 Pre-Screening Questions:  . In the past 7 to 10 days have you had a cough,  shortness of breath, headache, congestion, fever (100 or greater) body aches, chills, sore throat, or sudden loss of taste or sense of smell? no . Have you been around anyone with known Covid 19 in the past 7-10 days? no . Have you been around anyone who is awaiting Covid 19 test results in the past 7 to 10 days? no . Have you been around anyone who has been exposed to Covid 19, or has mentioned symptoms of Covid 19 within the past 7 to 10 days? no    I reviewed procedure/mask/visitor instructions, COVID-19 screening questions with patient, he verbalized understanding,  thanked me for call.

## 2020-01-20 NOTE — H&P (Signed)
   Mr. Arvizo is asymptomatic relative to coronary disease.  A nuclear stress test was done as a preop for cervical spine surgery planned later this year.  The nuclear study was found to demonstrate anterolateral ischemia in the distribution of a ramus intermedius.  He has stage III chronic kidney disease with creatinine of 1.3.  Also a history of paroxysmal atrial fibrillation on factor Xa inhibitor therapy.  Plan coronary angiography, when I try to [place] the aortic bioprosthetic valve, and need to obtain a little more history from the patient concerning the presence or absence of angina before PCI or any revascularization is performed.

## 2020-01-21 ENCOUNTER — Ambulatory Visit (HOSPITAL_COMMUNITY)
Admission: RE | Admit: 2020-01-21 | Discharge: 2020-01-21 | Disposition: A | Discharge status: Home / Self Care | Payer: BC Managed Care – PPO | Attending: Interventional Cardiology | Admitting: Interventional Cardiology

## 2020-01-21 ENCOUNTER — Encounter (HOSPITAL_COMMUNITY)
Admission: RE | Disposition: A | Discharge status: Home / Self Care | Payer: BC Managed Care – PPO | Source: Home / Self Care | Attending: Interventional Cardiology

## 2020-01-21 ENCOUNTER — Other Ambulatory Visit: Payer: Self-pay

## 2020-01-21 DIAGNOSIS — Z7901 Long term (current) use of anticoagulants: Secondary | ICD-10-CM | POA: Insufficient documentation

## 2020-01-21 DIAGNOSIS — Z888 Allergy status to other drugs, medicaments and biological substances status: Secondary | ICD-10-CM | POA: Diagnosis not present

## 2020-01-21 DIAGNOSIS — I2510 Atherosclerotic heart disease of native coronary artery without angina pectoris: Secondary | ICD-10-CM | POA: Diagnosis not present

## 2020-01-21 DIAGNOSIS — I2582 Chronic total occlusion of coronary artery: Secondary | ICD-10-CM | POA: Diagnosis not present

## 2020-01-21 DIAGNOSIS — E039 Hypothyroidism, unspecified: Secondary | ICD-10-CM | POA: Insufficient documentation

## 2020-01-21 DIAGNOSIS — I350 Nonrheumatic aortic (valve) stenosis: Secondary | ICD-10-CM | POA: Diagnosis not present

## 2020-01-21 DIAGNOSIS — Z7984 Long term (current) use of oral hypoglycemic drugs: Secondary | ICD-10-CM | POA: Insufficient documentation

## 2020-01-21 DIAGNOSIS — I10 Essential (primary) hypertension: Secondary | ICD-10-CM | POA: Diagnosis present

## 2020-01-21 DIAGNOSIS — N183 Chronic kidney disease, stage 3 unspecified: Secondary | ICD-10-CM | POA: Diagnosis not present

## 2020-01-21 DIAGNOSIS — E1169 Type 2 diabetes mellitus with other specified complication: Secondary | ICD-10-CM | POA: Diagnosis not present

## 2020-01-21 DIAGNOSIS — Z951 Presence of aortocoronary bypass graft: Secondary | ICD-10-CM | POA: Insufficient documentation

## 2020-01-21 DIAGNOSIS — I480 Paroxysmal atrial fibrillation: Secondary | ICD-10-CM | POA: Diagnosis not present

## 2020-01-21 DIAGNOSIS — R9439 Abnormal result of other cardiovascular function study: Secondary | ICD-10-CM | POA: Diagnosis present

## 2020-01-21 DIAGNOSIS — Z955 Presence of coronary angioplasty implant and graft: Secondary | ICD-10-CM | POA: Diagnosis not present

## 2020-01-21 DIAGNOSIS — I25810 Atherosclerosis of coronary artery bypass graft(s) without angina pectoris: Secondary | ICD-10-CM | POA: Insufficient documentation

## 2020-01-21 DIAGNOSIS — Z79899 Other long term (current) drug therapy: Secondary | ICD-10-CM | POA: Insufficient documentation

## 2020-01-21 DIAGNOSIS — E1122 Type 2 diabetes mellitus with diabetic chronic kidney disease: Secondary | ICD-10-CM | POA: Insufficient documentation

## 2020-01-21 DIAGNOSIS — E088 Diabetes mellitus due to underlying condition with unspecified complications: Secondary | ICD-10-CM | POA: Diagnosis present

## 2020-01-21 DIAGNOSIS — Z8249 Family history of ischemic heart disease and other diseases of the circulatory system: Secondary | ICD-10-CM | POA: Diagnosis not present

## 2020-01-21 DIAGNOSIS — R931 Abnormal findings on diagnostic imaging of heart and coronary circulation: Secondary | ICD-10-CM | POA: Insufficient documentation

## 2020-01-21 DIAGNOSIS — I129 Hypertensive chronic kidney disease with stage 1 through stage 4 chronic kidney disease, or unspecified chronic kidney disease: Secondary | ICD-10-CM | POA: Diagnosis not present

## 2020-01-21 DIAGNOSIS — E785 Hyperlipidemia, unspecified: Secondary | ICD-10-CM | POA: Diagnosis not present

## 2020-01-21 DIAGNOSIS — Z01810 Encounter for preprocedural cardiovascular examination: Secondary | ICD-10-CM

## 2020-01-21 DIAGNOSIS — I25118 Atherosclerotic heart disease of native coronary artery with other forms of angina pectoris: Secondary | ICD-10-CM | POA: Diagnosis present

## 2020-01-21 DIAGNOSIS — Z79890 Hormone replacement therapy (postmenopausal): Secondary | ICD-10-CM | POA: Diagnosis not present

## 2020-01-21 DIAGNOSIS — Z7982 Long term (current) use of aspirin: Secondary | ICD-10-CM | POA: Insufficient documentation

## 2020-01-21 HISTORY — PX: LEFT HEART CATH AND CORONARY ANGIOGRAPHY: CATH118249

## 2020-01-21 LAB — BASIC METABOLIC PANEL
Anion gap: 10 (ref 5–15)
BUN: 22 mg/dL (ref 8–23)
CO2: 25 mmol/L (ref 22–32)
Calcium: 9.3 mg/dL (ref 8.9–10.3)
Chloride: 104 mmol/L (ref 98–111)
Creatinine, Ser: 1.43 mg/dL — ABNORMAL HIGH (ref 0.61–1.24)
GFR calc Af Amer: 58 mL/min — ABNORMAL LOW (ref >60)
GFR calc non Af Amer: 50 mL/min — ABNORMAL LOW (ref >60)
Glucose, Bld: 177 mg/dL — ABNORMAL HIGH (ref 70–99)
Potassium: 4.3 mmol/L (ref 3.5–5.1)
Sodium: 139 mmol/L (ref 135–145)

## 2020-01-21 LAB — GLUCOSE, CAPILLARY: Glucose-Capillary: 187 mg/dL — ABNORMAL HIGH (ref 70–99)

## 2020-01-21 SURGERY — LEFT HEART CATH AND CORONARY ANGIOGRAPHY
Anesthesia: LOCAL

## 2020-01-21 MED ORDER — SODIUM CHLORIDE 0.9% FLUSH: 3.0000 mL | INTRAVENOUS | Status: DC | PRN

## 2020-01-21 MED ORDER — ASPIRIN 81 MG PO CHEW
81.0000 mg | CHEWABLE_TABLET | ORAL | Status: AC
  Administered 2020-01-21: 81 mg via ORAL
  Filled 2020-01-21: qty 1

## 2020-01-21 MED ORDER — SODIUM CHLORIDE 0.9 % WEIGHT BASED INFUSION
3.0000 mL/kg/h | INTRAVENOUS | Status: AC
  Administered 2020-01-21: 3 mL/kg/h via INTRAVENOUS

## 2020-01-21 MED ORDER — HEPARIN SODIUM (PORCINE) 1000 UNIT/ML IJ SOLN
INTRAMUSCULAR | Status: AC
  Filled 2020-01-21: qty 1

## 2020-01-21 MED ORDER — SODIUM CHLORIDE 0.9 % IV SOLN: 250.0000 mL | INTRAVENOUS | Status: DC | PRN

## 2020-01-21 MED ORDER — FENTANYL CITRATE (PF) 100 MCG/2ML IJ SOLN
INTRAMUSCULAR | Status: AC
  Filled 2020-01-21: qty 2

## 2020-01-21 MED ORDER — VERAPAMIL HCL 2.5 MG/ML IV SOLN
INTRAVENOUS | Status: DC | PRN
  Administered 2020-01-21: 10 mL via INTRA_ARTERIAL

## 2020-01-21 MED ORDER — SODIUM CHLORIDE 0.9% FLUSH: 3.0000 mL | Freq: Two times a day (BID) | INTRAVENOUS | Status: DC

## 2020-01-21 MED ORDER — HEPARIN (PORCINE) IN NACL 1000-0.9 UT/500ML-% IV SOLN
INTRAVENOUS | Status: AC
  Filled 2020-01-21: qty 500

## 2020-01-21 MED ORDER — ASPIRIN 81 MG PO CHEW: 81.0000 mg | CHEWABLE_TABLET | Freq: Every day | ORAL | Status: DC

## 2020-01-21 MED ORDER — SODIUM CHLORIDE 0.9 % WEIGHT BASED INFUSION: 1.0000 mL/kg/h | INTRAVENOUS | Status: DC

## 2020-01-21 MED ORDER — MIDAZOLAM HCL 2 MG/2ML IJ SOLN
INTRAMUSCULAR | Status: AC
  Filled 2020-01-21: qty 2

## 2020-01-21 MED ORDER — LIDOCAINE HCL (PF) 1 % IJ SOLN
INTRAMUSCULAR | Status: AC
  Filled 2020-01-21: qty 30

## 2020-01-21 MED ORDER — VERAPAMIL HCL 2.5 MG/ML IV SOLN
INTRAVENOUS | Status: AC
  Filled 2020-01-21: qty 2

## 2020-01-21 MED ORDER — HEPARIN (PORCINE) IN NACL 1000-0.9 UT/500ML-% IV SOLN
INTRAVENOUS | Status: DC | PRN
  Administered 2020-01-21: 500 mL

## 2020-01-21 MED ORDER — HYDRALAZINE HCL 20 MG/ML IJ SOLN: 10.0000 mg | INTRAMUSCULAR | Status: DC | PRN

## 2020-01-21 MED ORDER — ACETAMINOPHEN 325 MG PO TABS: 650.0000 mg | ORAL_TABLET | ORAL | Status: DC | PRN

## 2020-01-21 MED ORDER — HEPARIN SODIUM (PORCINE) 1000 UNIT/ML IJ SOLN
INTRAMUSCULAR | Status: DC | PRN
  Administered 2020-01-21: 4500 [IU] via INTRAVENOUS

## 2020-01-21 MED ORDER — FENTANYL CITRATE (PF) 100 MCG/2ML IJ SOLN
INTRAMUSCULAR | Status: DC | PRN
  Administered 2020-01-21: 25 ug via INTRAVENOUS

## 2020-01-21 MED ORDER — IOHEXOL 350 MG/ML SOLN
INTRAVENOUS | Status: DC | PRN
  Administered 2020-01-21: 70 mL

## 2020-01-21 MED ORDER — LABETALOL HCL 5 MG/ML IV SOLN: 10.0000 mg | INTRAVENOUS | Status: DC | PRN

## 2020-01-21 MED ORDER — HEPARIN (PORCINE) IN NACL 1000-0.9 UT/500ML-% IV SOLN
INTRAVENOUS | Status: DC | PRN
  Administered 2020-01-21 (×2): 500 mL

## 2020-01-21 MED ORDER — OXYCODONE HCL 5 MG PO TABS: 5.0000 mg | ORAL_TABLET | ORAL | Status: DC | PRN

## 2020-01-21 MED ORDER — MIDAZOLAM HCL 2 MG/2ML IJ SOLN
INTRAMUSCULAR | Status: DC | PRN
  Administered 2020-01-21: 1 mg via INTRAVENOUS

## 2020-01-21 MED ORDER — LIDOCAINE HCL (PF) 1 % IJ SOLN
INTRAMUSCULAR | Status: DC | PRN
  Administered 2020-01-21: 5 mL

## 2020-01-21 MED ORDER — SODIUM CHLORIDE 0.9 % IV SOLN: INTRAVENOUS | Status: DC

## 2020-01-21 MED ORDER — ONDANSETRON HCL 4 MG/2ML IJ SOLN: 4.0000 mg | Freq: Four times a day (QID) | INTRAMUSCULAR | Status: DC | PRN

## 2020-01-21 MED ORDER — IOHEXOL 350 MG/ML SOLN
INTRAVENOUS | Status: AC
  Filled 2020-01-21: qty 1

## 2020-01-21 SURGICAL SUPPLY — 11 items
CATH INFINITI 5FR JL4 (CATHETERS) ×1 IMPLANT
CATH INFINITI JR4 5F (CATHETERS) ×1 IMPLANT
DEVICE RAD COMP TR BAND LRG (VASCULAR PRODUCTS) ×1 IMPLANT
GLIDESHEATH SLEND A-KIT 6F 22G (SHEATH) ×1 IMPLANT
GUIDEWIRE INQWIRE 1.5J.035X260 (WIRE) IMPLANT
INQWIRE 1.5J .035X260CM (WIRE) ×2
KIT HEART LEFT (KITS) ×2 IMPLANT
PACK CARDIAC CATHETERIZATION (CUSTOM PROCEDURE TRAY) ×2 IMPLANT
SHEATH PROBE COVER 6X72 (BAG) ×1 IMPLANT
TRANSDUCER W/STOPCOCK (MISCELLANEOUS) ×2 IMPLANT
TUBING CIL FLEX 10 FLL-RA (TUBING) ×2 IMPLANT

## 2020-01-21 NOTE — CV Procedure (Signed)
   Left heart cath with coronary angio and bypass angio.  LIMA to LAD  Totally occluded proximal to mid LAD  Patent circumflex with 50% proximal stenosis.  Patent RCA with irregularities up to 40% in the mid vessel.  No change compared to the coronary angiogram performed in 2019.  Abnormal nuclear stress test secondary to native vessel disease and graft occlusion and will be a chronic finding.

## 2020-01-21 NOTE — Progress Notes (Signed)
Per Ria Comment PA/ cards, pt to restart Eliquis  tomorrow

## 2020-01-21 NOTE — Discharge Instructions (Signed)
RESTART METFORMIN Friday 3/5 AM,  RESTART ELIQUIS TOMORROW IF NO BLEEDING OVER NIGHT Radial Site Care  This sheet gives you information about how to care for yourself after your procedure. Your health care provider may also give you more specific instructions. If you have problems or questions, contact your health care provider. What can I expect after the procedure? After the procedure, it is common to have:  Bruising and tenderness at the catheter insertion area. Follow these instructions at home: Medicines  Take over-the-counter and prescription medicines only as told by your health care provider. Insertion site care  Follow instructions from your health care provider about how to take care of your insertion site. Make sure you: ? Wash your hands with soap and water before you change your bandage (dressing). If soap and water are not available, use hand sanitizer. ? Change your dressing as told by your health care provider. ? Leave stitches (sutures), skin glue, or adhesive strips in place. These skin closures may need to stay in place for 2 weeks or longer. If adhesive strip edges start to loosen and curl up, you may trim the loose edges. Do not remove adhesive strips completely unless your health care provider tells you to do that.  Check your insertion site every day for signs of infection. Check for: ? Redness, swelling, or pain. ? Fluid or blood. ? Pus or a bad smell. ? Warmth.  Do not take baths, swim, or use a hot tub until your health care provider approves.  You may shower 24-48 hours after the procedure, or as directed by your health care provider. ? Remove the dressing and gently wash the site with plain soap and water. ? Pat the area dry with a clean towel. ? Do not rub the site. That could cause bleeding.  Do not apply powder or lotion to the site. Activity   For 24 hours after the procedure, or as directed by your health care provider: ? Do not flex or bend the  affected arm. ? Do not push or pull heavy objects with the affected arm. ? Do not drive yourself home from the hospital or clinic. You may drive 24 hours after the procedure unless your health care provider tells you not to. ? Do not operate machinery or power tools.  Do not lift anything that is heavier than 10 lb (4.5 kg), or the limit that you are told, until your health care provider says that it is safe.  Ask your health care provider when it is okay to: ? Return to work or school. ? Resume usual physical activities or sports. ? Resume sexual activity. General instructions  If the catheter site starts to bleed, raise your arm and put firm pressure on the site. If the bleeding does not stop, get help right away. This is a medical emergency.  If you went home on the same day as your procedure, a responsible adult should be with you for the first 24 hours after you arrive home.  Keep all follow-up visits as told by your health care provider. This is important. Contact a health care provider if:  You have a fever.  You have redness, swelling, or yellow drainage around your insertion site. Get help right away if:  You have unusual pain at the radial site.  The catheter insertion area swells very fast.  The insertion area is bleeding, and the bleeding does not stop when you hold steady pressure on the area.  Your arm or  hand becomes pale, cool, tingly, or numb. These symptoms may represent a serious problem that is an emergency. Do not wait to see if the symptoms will go away. Get medical help right away. Call your local emergency services (911 in the [place] Do not drive yourself to the hospital. Summary  After the procedure, it is common to have bruising and tenderness at the site.  Follow instructions from your health care provider about how to take care of your radial site wound. Check the wound every day for signs of infection.  Do not lift anything that is heavier than 10  lb (4.5 kg), or the limit that you are told, until your health care provider says that it is safe. This information is not intended to replace advice given to you by your health care provider. Make sure you discuss any questions you have with your health care provider. Document Revised: 12/13/2017 Document Reviewed: 12/13/2017 Elsevier Patient Education  2020 [employer].

## 2020-01-28 DIAGNOSIS — I2510 Atherosclerotic heart disease of native coronary artery without angina pectoris: Secondary | ICD-10-CM | POA: Diagnosis not present

## 2020-01-29 ENCOUNTER — Encounter: Payer: Self-pay | Admitting: *Deleted

## 2020-01-29 LAB — BASIC METABOLIC PANEL
BUN/Creatinine Ratio: 12 (ref 10–24)
BUN: 18 mg/dL (ref 8–27)
CO2: 22 mmol/L (ref 20–29)
Calcium: 10.2 mg/dL (ref 8.6–10.2)
Chloride: 100 mmol/L (ref 96–106)
Creatinine, Ser: 1.5 mg/dL — ABNORMAL HIGH (ref 0.76–1.27)
GFR calc Af Amer: 55 mL/min/{1.73_m2} — ABNORMAL LOW (ref >59)
GFR calc non Af Amer: 47 mL/min/{1.73_m2} — ABNORMAL LOW (ref >59)
Glucose: 137 mg/dL — ABNORMAL HIGH (ref 65–99)
Potassium: 4.4 mmol/L (ref 3.5–5.2)
Sodium: 139 mmol/L (ref 134–144)

## 2020-02-04 ENCOUNTER — Other Ambulatory Visit: Payer: Self-pay

## 2020-02-04 ENCOUNTER — Encounter: Payer: Self-pay | Admitting: Cardiology

## 2020-02-04 ENCOUNTER — Ambulatory Visit: Payer: BC Managed Care – PPO | Admitting: Cardiology

## 2020-02-04 ENCOUNTER — Other Ambulatory Visit: Payer: Self-pay | Admitting: Cardiology

## 2020-02-04 VITALS — BP 140/86 | HR 72 | Ht 70.0 in | Wt 190.0 lb

## 2020-02-04 DIAGNOSIS — Z1329 Encounter for screening for other suspected endocrine disorder: Secondary | ICD-10-CM

## 2020-02-04 DIAGNOSIS — Z952 Presence of prosthetic heart valve: Secondary | ICD-10-CM

## 2020-02-04 DIAGNOSIS — E088 Diabetes mellitus due to underlying condition with unspecified complications: Secondary | ICD-10-CM

## 2020-02-04 DIAGNOSIS — I10 Essential (primary) hypertension: Secondary | ICD-10-CM | POA: Diagnosis not present

## 2020-02-04 DIAGNOSIS — I2510 Atherosclerotic heart disease of native coronary artery without angina pectoris: Secondary | ICD-10-CM | POA: Diagnosis not present

## 2020-02-04 DIAGNOSIS — I480 Paroxysmal atrial fibrillation: Secondary | ICD-10-CM

## 2020-02-04 MED ORDER — AMIODARONE HCL 400 MG PO TABS: 400.0000 mg | ORAL_TABLET | Freq: Every day | ORAL | 6 refills | Status: DC

## 2020-02-04 MED ORDER — APIXABAN 5 MG PO TABS: 5.0000 mg | ORAL_TABLET | Freq: Two times a day (BID) | ORAL | 3 refills | Status: DC

## 2020-02-04 NOTE — Patient Instructions (Signed)
Medication Instructions:  Your physician has recommended you make the following change in your medication:   Start Amiodarone 400 mg daily for  2 weeks then start taking 1/2 tablet (200 mg) daily.  *If you need a refill on your cardiac medications before your next appointment, please call your pharmacy*   Lab Work: You had a BMET, TSH and LFT's today If you have labs (blood work) drawn today and your tests are completely normal, you will receive your results only by: Marland Kitchen MyChart Message (if you have MyChart) OR . A paper copy in the mail If you have any lab test that is abnormal or we need to change your treatment, we will call you to review the results.   Testing/Procedures: None ordered   Follow-Up: At [facility], you and your health needs are our priority.  As part of our continuing mission to provide you with exceptional heart care, we have created designated Provider Care Teams.  These Care Teams include your primary Cardiologist (physician) and Advanced Practice Providers (APPs -  Physician Assistants and Nurse Practitioners) who all work together to provide you with the care you need, when you need it.  We recommend signing up for the patient portal called "MyChart".  Sign up information is provided on this After Visit Summary.  MyChart is used to connect with patients for Virtual Visits (Telemedicine).  Patients are able to view lab/test results, encounter notes, upcoming appointments, etc.  Non-urgent messages can be sent to your provider as well.   To learn more about what you can do with MyChart, go to [email or web address].    Your next appointment:   2 month(s)  The format for your next appointment:   In Person  Provider:   Jyl Heinz, MD   Other Instructions NA

## 2020-02-04 NOTE — Progress Notes (Signed)
Cardiology Office Note:    Date:  02/04/2020   ID:  Johnathan Murray, DOB 1951/01/31, MRN [id number]  PCP:  Nicoletta Dress, MD  Cardiologist:  Jenean Lindau, MD   Referring MD: Nicoletta Dress, MD    ASSESSMENT:    No diagnosis found. PLAN:    In order of problems listed above:  1. Preoperative cardiovascular evaluation: Patient has undergone coronary angiography and this has been unremarkable.  He is asymptomatic.  In view of the above is not at high risk for coronary events during the aforementioned surgery.  Meticulous hemodynamic monitoring.  Perioperative beta-blockade will further reduce the risk of coronary events.  I discussed this with the patient at length. 2. Paroxysmal atrial fibrillation: In sinus rhythm.  Patient states that after lunch she sometimes feels a sensation of irregular heartbeat.  The patient request for me to restart amiodarone because he says when he is on amiodarone his heart rate is very steady and he does not feel any palpitations.  In view of history of elevated LFTs in the past I will check LFTs today and start him on a dose of 400 mg/day for 2 weeks and then 200 mg daily.  Benefits and potential risks explained and he vocalized understanding.  Hopefully it will reduce chances of recurrence of of atrial fibrillation in the perioperative.  And will make his surgery better chances of success.  Also in view of the significant surgery involving the nervous system I think it would be fair to withhold his anticoagulation for a span of time felt reasonable by his surgeons.  They will initiate his anticoagulation when they feel appropriate after the surgery.  If possible patient needs to be on a coated aspirin 81 mg on a daily basis in an interrupted fashion only if his surgery permits this.  If the benefit risk ratio is not favorable then it may be withheld and the patient understands the risks. 3. Essential hypertension: Blood pressure stable 4. Patient  will be seen in follow-up appointment in 2 months or earlier if the patient has any concerns    Medication Adjustments/Labs and Tests Ordered: Current medicines are reviewed at length with the patient today.  Concerns regarding medicines are outlined above.  No orders of the defined types were placed in this encounter.  No orders of the defined types were placed in this encounter.    Chief Complaint  Patient presents with  . Follow-up     History of Present Illness:    Johnathan Murray is a 69 y.o. male.  Patient has past medical history of coronary artery disease post bypass surgery and bioprosthetic aortic valve, essential hypertension, paroxysmal fibrillation and dyslipidemia.  He has had abnormal stress test and undergone coronary angiography.  The report is discussed below.  Subsequently is done fine.  He is getting ready for surgery for neurological issues.  No chest pain orthopnea or PND.  At the time of my evaluation, the patient is alert awake oriented and in no distress.  Past Medical History:  Diagnosis Date  . Abnormal LFTs (liver function tests) 10/31/2016  . Abnormal nuclear stress test 05/03/2018  . Acid reflux 02/15/2018  . Acute otitis externa of left ear 11/13/2019  . Acute suppurative otitis media of left ear without spontaneous rupture of tympanic membrane 11/13/2019  . Anemia    low iron  . Aortic stenosis 05/03/2018  . Arthritis 02/15/2018   Overview:  shoulder  . Cervical myelopathy ([place]) 04/22/2019  .  Conductive hearing loss of left ear 11/13/2019  . Coronary artery disease involving native coronary artery of native heart without angina pectoris 05/30/2016  . Coronary artery disease of native artery of native heart with stable angina pectoris ([place])   . Diabetes mellitus due to underlying condition with unspecified complications ([place]) [id number]  . Dyslipidemia 05/30/2016  . Dyspnea    upon exertion,  as of 06/05/09 not having any recent chest pain  .  Essential hypertension 05/30/2016  . Heart murmur    Has had an Aortic valve replacement  . Hyperactive gag reflex 02/15/2018  . Hypothyroidism   . Intermittent claudication ([place]) 10/31/2018  . Lumbar stenosis with neurogenic claudication 06/11/2019  . Macular degeneration 02/15/2018  . PAF (paroxysmal atrial fibrillation) ([place]) 05/30/2016  . Pre-operative cardiovascular examination 01/03/2020  . Preoperative cardiovascular examination 04/17/2019  . Renal insufficiency 01/17/2020  . S/P aortic valve replacement 01/17/2020  . Tinnitus of left ear 11/13/2019    Past Surgical History:  Procedure Laterality Date  . ANTERIOR CERVICAL DECOMP/DISCECTOMY FUSION N/A 04/22/2019   Procedure: Anterior Cervical Discectomy and Fusion, Cervical four-five, Cervical five-six;  Surgeon: Earnie Larsson, MD;  Location: [place];  Service: Neurosurgery;  Laterality: N/A;  . AORTIC VALVE REPLACEMENT    . CARDIAC CATHETERIZATION     several stents  . CARDIAC SURGERY    . CORONARY ARTERY BYPASS GRAFT  2017   patient unsure how many, had at [facility]  . HAND SURGERY    . KNEE SURGERY    . LEFT HEART CATH AND CORONARY ANGIOGRAPHY N/A 01/21/2020   Procedure: LEFT HEART CATH AND CORONARY ANGIOGRAPHY;  Surgeon: Belva Crome, MD;  Location: [place] [facility];  Service: Cardiovascular;  Laterality: N/A;  . LEFT HEART CATH AND CORS/GRAFTS ANGIOGRAPHY N/A 05/16/2018   Procedure: LEFT HEART CATH AND CORS/GRAFTS ANGIOGRAPHY;  Surgeon: Burnell Blanks, MD;  Location: [place] [facility];  Service: Cardiovascular;  Laterality: N/A;  . LUMBAR LAMINECTOMY/DECOMPRESSION MICRODISCECTOMY Bilateral 06/11/2019   Procedure: Bilateral Lumbar Four-FiveLaminectomy/Foraminotomy;  Surgeon: Earnie Larsson, MD;  Location: [place];  Service: Neurosurgery;  Laterality: Bilateral;  posterior    Current Medications: Current Meds  Medication Sig  . amLODipine (NORVASC) 10 MG tablet Take 10 mg by mouth at bedtime.   Marland Kitchen apixaban  (ELIQUIS) 5 MG TABS tablet Take 1 tablet (5 mg total) by mouth 2 (two) times daily.  . benazepril (LOTENSIN) 40 MG tablet Take 40 mg by mouth at bedtime.   . Coenzyme Q10 (COQ10) 200 MG CAPS Take 200 mg by mouth daily.   Marland Kitchen ezetimibe (ZETIA) 10 MG tablet Take 1 tablet (10 mg total) by mouth at bedtime.  . furosemide (LASIX) 40 MG tablet Take 40 mg by mouth daily.  Marland Kitchen levothyroxine (SYNTHROID) 25 MCG tablet Take 25 mcg by mouth daily.  . metFORMIN (GLUCOPHAGE) 500 MG tablet Take 500 mg by mouth at bedtime.   . metoprolol tartrate (LOPRESSOR) 50 MG tablet Take 1 tablet (50 mg total) by mouth daily as needed. (Patient taking differently: Take 50 mg by mouth 3 (three) times daily as needed (afib). )  . nitroGLYCERIN (NITROSTAT) 0.4 MG SL tablet Place 1 tablet (0.4 mg total) under the tongue every 5 (five) minutes as needed for chest pain.  . vitamin B-12 (CYANOCOBALAMIN) 1000 MCG tablet Take 1,000 mcg by mouth daily.  . vitamin C (ASCORBIC ACID) 500 MG tablet Take 500 mg by mouth daily.   Marland Kitchen zinc gluconate 50 MG tablet Take 50  mg by mouth daily.     Allergies:   Atorvastatin and Nebivolol   Social History   Socioeconomic History  . Marital status: Married    Spouse name: [name]  . Number of children: 1  . Years of education: Not on file  . Highest education level: GED or equivalent  Occupational History    Employer: [employer]  Tobacco Use  . Smoking status: Never Smoker  . Smokeless tobacco: Never Used  Substance and Sexual Activity  . Alcohol use: Yes    Alcohol/week: 2.0 - 3.0 standard drinks    Types: 2 - 3 Cans of beer per week  . Drug use: No  . Sexual activity: Not on file  Other Topics Concern  . Not on file  Social History Narrative   He drives a truck for [employer].   He lives with wife in a one-level home.   Highest level of education:  GED      Patient is right-handed. There are 8 steps to enter his home.   Social Determinants of Health   Financial  Resource Strain:   . Difficulty of Paying Living Expenses:   Food Insecurity:   . Worried About Charity fundraiser in the Last Year:   . Arboriculturist in the Last Year:   Transportation Needs:   . Film/video editor (Medical):   Marland Kitchen Lack of Transportation (Non-Medical):   Physical Activity:   . Days of Exercise per Week:   . Minutes of Exercise per Session:   Stress:   . Feeling of Stress :   Social Connections:   . Frequency of Communication with Friends and Family:   . Frequency of Social Gatherings with Friends and Family:   . Attends Religious Services:   . Active Member of Clubs or Organizations:   . Attends Archivist Meetings:   Marland Kitchen Marital Status:      Family History: The patient's family history includes Brain cancer in his mother; Heart disease in his maternal grandmother; Prostate cancer in his brother.  ROS:   Please see the history of present illness.    All other systems reviewed and are negative.  EKGs/Labs/Other Studies Reviewed:    The following studies were reviewed today: Belva Crome, MD Study date: 01/21/20  MyChart Results Release  MyChart Status: Declined   Physicians  Panel Physicians Referring Physician Case Authorizing Physician  Belva Crome, MD (Primary)    Procedures  LEFT HEART CATH AND CORONARY ANGIOGRAPHY  Conclusion   Aortic bioprosthesis was not crossed and therefore hemodynamics in the LV were not recorded.  Patent LIMA to the LAD.  Occluded saphenous vein graft to the marginal.  Occluded saphenous vein graft to the diagonal.  Total occlusion of the mid to proximal LAD.  Patent circumflex with 50% proximal stenosis and segmental diffuse disease extending into the large obtuse marginal.  RCA is dominant.  There is diffuse irregularity noted throughout the proximal to distal vessel with up to 30% in the mid vessel.  RECOMMENDATIONS:   Continue current therapy.  No change in anatomy is noted compared to  the prior angiogram performed in 2019.  The abnormal nuclear study with anterolateral ischemia will be a chronic finding and in absence of symptoms does not warrant repeat invasive investigation.      Recent Labs: 01/17/2020: Hemoglobin 13.2; Platelets 221 01/28/2020: BUN 18; Creatinine, Ser 1.50; Potassium 4.4; Sodium 139  Recent Lipid Panel    Component Value  Date/Time   CHOL 217 (H) 05/21/2018 1130   TRIG 61 05/21/2018 1130   HDL 100 05/21/2018 1130   CHOLHDL 2.2 05/21/2018 1130   LDLCALC 105 (H) 05/21/2018 1130    Physical Exam:    VS:  BP 140/86   Pulse 72   Ht 5\' 10"  (1.778 m)   Wt 190 lb (86.2 kg)   SpO2 97%   BMI 27.26 kg/m     Wt Readings from Last 3 Encounters:  02/04/20 190 lb (86.2 kg)  01/21/20 190 lb (86.2 kg)  01/17/20 192 lb (87.1 kg)     GEN: Patient is in no acute distress HEENT: Normal NECK: No JVD; No carotid bruits LYMPHATICS: No lymphadenopathy CARDIAC: Hear sounds regular, 2/6 systolic murmur at the apex. RESPIRATORY:  Clear to auscultation without rales, wheezing or rhonchi  ABDOMEN: Soft, non-tender, non-distended MUSCULOSKELETAL:  No edema; No deformity  SKIN: Warm and dry NEUROLOGIC:  Alert and oriented x 3 PSYCHIATRIC:  Normal affect   Signed, Jenean Lindau, MD  02/04/2020 2:15 PM    [facility]

## 2020-02-05 LAB — BASIC METABOLIC PANEL
BUN/Creatinine Ratio: 16 (ref 10–24)
BUN: 22 mg/dL (ref 8–27)
CO2: 23 mmol/L (ref 20–29)
Calcium: 10 mg/dL (ref 8.6–10.2)
Chloride: 102 mmol/L (ref 96–106)
Creatinine, Ser: 1.34 mg/dL — ABNORMAL HIGH (ref 0.76–1.27)
GFR calc Af Amer: 63 mL/min/{1.73_m2} (ref >59)
GFR calc non Af Amer: 54 mL/min/{1.73_m2} — ABNORMAL LOW (ref >59)
Glucose: 134 mg/dL — ABNORMAL HIGH (ref 65–99)
Potassium: 4.5 mmol/L (ref 3.5–5.2)
Sodium: 139 mmol/L (ref 134–144)

## 2020-02-05 LAB — HEPATIC FUNCTION PANEL
ALT: 23 IU/L (ref 0–44)
AST: 21 IU/L (ref 0–40)
Albumin: 4.8 g/dL (ref 3.8–4.8)
Alkaline Phosphatase: 88 IU/L (ref 39–117)
Bilirubin Total: 1.2 mg/dL (ref 0.0–1.2)
Bilirubin, Direct: 0.27 mg/dL (ref 0.00–0.40)
Total Protein: 7.1 g/dL (ref 6.0–8.5)

## 2020-02-05 LAB — TSH: TSH: 1.72 u[IU]/mL (ref 0.450–4.500)

## 2020-02-06 ENCOUNTER — Other Ambulatory Visit: Payer: Self-pay | Admitting: Neurosurgery

## 2020-02-06 DIAGNOSIS — H353211 Exudative age-related macular degeneration, right eye, with active choroidal neovascularization: Secondary | ICD-10-CM | POA: Diagnosis not present

## 2020-02-12 DIAGNOSIS — M4802 Spinal stenosis, cervical region: Secondary | ICD-10-CM | POA: Diagnosis not present

## 2020-02-21 NOTE — Pre-Procedure Instructions (Signed)
Johnathan Murray  02/21/2020      [id number] - [place], [place] - [address] [address] Phone: [phone] Fax: [phone]    Your procedure is scheduled on April 12  Report to [facility] entrance A at 9:50 A.M.  Call this number if you have problems the morning of surgery:              [phone]   Remember:  Do not eat or drink after midnight.      Take these medicines the morning of surgery with A SIP OF WATER :                Amiodarone (pacerone)               Levothyroxine (synthroid)               Metoprolol (lopressor) if needed               Nitroglycerine if needed                     7 days prior to surgery STOP taking any Aspirin and eliquis  (unless otherwise instructed by your surgeon), Aleve, Naproxen, Ibuprofen, Motrin, Advil, Goody's, BC's, all herbal medications, fish oil, and all vitamins.                  Follow your surgeon's instructions on when to stop Aspirin and eliquis.  If no instructions were given by your surgeon then you will need to call the office to get those instructions.                            How to Manage Your Diabetes Before and After Surgery  Why is it important to control my blood sugar before and after surgery? . Improving blood sugar levels before and after surgery helps healing and can limit problems. . A way of improving blood sugar control is eating a healthy diet by: o  Eating less sugar and carbohydrates o  Increasing activity/exercise o  Talking with your doctor about reaching your blood sugar goals . High blood sugars (greater than 180 mg/dL) can raise your risk of infections and slow your recovery, so you will need to focus on controlling your diabetes during the weeks before surgery. . Make sure that the doctor who takes care of your diabetes knows about your planned surgery including the date and location.  How do I manage my blood sugar before surgery? . Check your  blood sugar at least 4 times a day, starting 2 days before surgery, to make sure that the level is not too high or low. o Check your blood sugar the morning of your surgery when you wake up and every 2 hours until you get to the Short Stay unit. . If your blood sugar is less than 70 mg/dL, you will need to treat for low blood sugar: o Do not take insulin. o Treat a low blood sugar (less than 70 mg/dL) with  cup of clear juice (cranberry or apple), 4 glucose tablets, OR glucose gel. Recheck blood sugar in 15 minutes after treatment (to make sure it is greater than 70 mg/dL). If your blood sugar is not greater than 70 mg/dL on recheck, call [phone] o  for further instructions. . Report your blood sugar to the short stay nurse when you get to Short  Stay.  . If you are admitted to the hospital after surgery: o Your blood sugar will be checked by the staff and you will probably be given insulin after surgery (instead of oral diabetes medicines) to make sure you have good blood sugar levels. o The goal for blood sugar control after surgery is 80-180 mg/dL.        WHAT DO I DO ABOUT MY DIABETES MEDICATION?    Marland Kitchen Do not take oral diabetes medicines (pills) the morning of surgery.       Do not wear jewelry.  Do not wear lotions, powders, or perfumes, or deodorant.  Do not shave 48 hours prior to surgery.  Men may shave face and neck.  Do not bring valuables to the hospital.  [facility] is not responsible for any belongings or valuables.  Contacts, dentures or bridgework may not be worn into surgery.  Leave your suitcase in the car.  After surgery it may be brought to your room.  For patients admitted to the hospital, discharge time will be determined by your treatment team.  Patients discharged the day of surgery will not be allowed to drive home.    Special instructions:   [place]- Preparing For Surgery  Before surgery, you can play an important role. Because skin is not  sterile, your skin needs to be as free of germs as possible. You can reduce the number of germs on your skin by washing with CHG (chlorahexidine gluconate) Soap before surgery.  CHG is an antiseptic cleaner which kills germs and bonds with the skin to continue killing germs even after washing.    Oral Hygiene is also important to reduce your risk of infection.  Remember - BRUSH YOUR TEETH THE MORNING OF SURGERY WITH YOUR REGULAR TOOTHPASTE  Please do not use if you have an allergy to CHG or antibacterial soaps. If your skin becomes reddened/irritated stop using the CHG.  Do not shave (including legs and underarms) for at least 48 hours prior to first CHG shower. It is OK to shave your face.  Please follow these instructions carefully.   1. Shower the NIGHT BEFORE SURGERY and the MORNING OF SURGERY with CHG.   2. If you chose to wash your hair, wash your hair first as usual with your normal shampoo.  3. After you shampoo, rinse your hair and body thoroughly to remove the shampoo.  4. Use CHG as you would any other liquid soap. You can apply CHG directly to the skin and wash gently with a scrungie or a clean washcloth.   5. Apply the CHG Soap to your body ONLY FROM THE NECK DOWN.  Do not use on open wounds or open sores. Avoid contact with your eyes, ears, mouth and genitals (private parts). Wash Face and genitals (private parts)  with your normal soap.  6. Wash thoroughly, paying special attention to the area where your surgery will be performed.  7. Thoroughly rinse your body with warm water from the neck down.  8. DO NOT shower/wash with your normal soap after using and rinsing off the CHG Soap.  9. Pat yourself dry with a CLEAN TOWEL.  10. Wear CLEAN PAJAMAS to bed the night before surgery, wear comfortable clothes the morning of surgery  11. Place CLEAN SHEETS on your bed the night of your first shower and DO NOT SLEEP WITH PETS.    Day of Surgery:  Do not apply any  deodorants/lotions.  Please wear clean clothes to the  hospital/surgery center.   Remember to brush your teeth WITH YOUR REGULAR TOOTHPASTE.    Please read over the following fact sheets that you were given. Coughing and Deep Breathing, MRSA Information and Surgical Site Infection Prevention

## 2020-02-24 ENCOUNTER — Other Ambulatory Visit: Payer: Self-pay

## 2020-02-24 ENCOUNTER — Encounter (HOSPITAL_COMMUNITY): Payer: Self-pay

## 2020-02-24 ENCOUNTER — Encounter (HOSPITAL_COMMUNITY)
Admission: RE | Admit: 2020-02-24 | Discharge: 2020-02-24 | Disposition: A | Discharge status: Home / Self Care | Payer: BC Managed Care – PPO | Source: Ambulatory Visit | Attending: Neurosurgery | Admitting: Neurosurgery

## 2020-02-24 DIAGNOSIS — Z7984 Long term (current) use of oral hypoglycemic drugs: Secondary | ICD-10-CM | POA: Insufficient documentation

## 2020-02-24 DIAGNOSIS — E785 Hyperlipidemia, unspecified: Secondary | ICD-10-CM | POA: Insufficient documentation

## 2020-02-24 DIAGNOSIS — Z79899 Other long term (current) drug therapy: Secondary | ICD-10-CM | POA: Insufficient documentation

## 2020-02-24 DIAGNOSIS — Z951 Presence of aortocoronary bypass graft: Secondary | ICD-10-CM | POA: Diagnosis not present

## 2020-02-24 DIAGNOSIS — E118 Type 2 diabetes mellitus with unspecified complications: Secondary | ICD-10-CM | POA: Diagnosis not present

## 2020-02-24 DIAGNOSIS — D649 Anemia, unspecified: Secondary | ICD-10-CM | POA: Diagnosis not present

## 2020-02-24 DIAGNOSIS — I480 Paroxysmal atrial fibrillation: Secondary | ICD-10-CM | POA: Diagnosis not present

## 2020-02-24 DIAGNOSIS — Z01812 Encounter for preprocedural laboratory examination: Secondary | ICD-10-CM | POA: Diagnosis not present

## 2020-02-24 DIAGNOSIS — Z7901 Long term (current) use of anticoagulants: Secondary | ICD-10-CM | POA: Insufficient documentation

## 2020-02-24 DIAGNOSIS — E039 Hypothyroidism, unspecified: Secondary | ICD-10-CM | POA: Insufficient documentation

## 2020-02-24 DIAGNOSIS — I2510 Atherosclerotic heart disease of native coronary artery without angina pectoris: Secondary | ICD-10-CM | POA: Diagnosis not present

## 2020-02-24 DIAGNOSIS — I10 Essential (primary) hypertension: Secondary | ICD-10-CM | POA: Insufficient documentation

## 2020-02-24 HISTORY — DX: Cardiac arrhythmia, unspecified: I49.9

## 2020-02-24 LAB — CBC WITH DIFFERENTIAL/PLATELET
Abs Immature Granulocytes: 0.02 K/uL (ref 0.00–0.07)
Basophils Absolute: 0.1 K/uL (ref 0.0–0.1)
Basophils Relative: 1 %
Eosinophils Absolute: 0.1 K/uL (ref 0.0–0.5)
Eosinophils Relative: 2 %
HCT: 40.3 % (ref 39.0–52.0)
Hemoglobin: 13.6 g/dL (ref 13.0–17.0)
Immature Granulocytes: 0 %
Lymphocytes Relative: 28 %
Lymphs Abs: 1.2 K/uL (ref 0.7–4.0)
MCH: 32.2 pg (ref 26.0–34.0)
MCHC: 33.7 g/dL (ref 30.0–36.0)
MCV: 95.5 fL (ref 80.0–100.0)
Monocytes Absolute: 0.5 K/uL (ref 0.1–1.0)
Monocytes Relative: 11 %
Neutro Abs: 2.6 K/uL (ref 1.7–7.7)
Neutrophils Relative %: 58 %
Platelets: 184 K/uL (ref 150–400)
RBC: 4.22 MIL/uL (ref 4.22–5.81)
RDW: 12.4 % (ref 11.5–15.5)
WBC: 4.5 K/uL (ref 4.0–10.5)
nRBC: 0 % (ref 0.0–0.2)

## 2020-02-24 LAB — COMPREHENSIVE METABOLIC PANEL
ALT: 17 U/L (ref 0–44)
AST: 18 U/L (ref 15–41)
Albumin: 4.3 g/dL (ref 3.5–5.0)
Alkaline Phosphatase: 68 U/L (ref 38–126)
Anion gap: 8 (ref 5–15)
BUN: 25 mg/dL — ABNORMAL HIGH (ref 8–23)
CO2: 23 mmol/L (ref 22–32)
Calcium: 9.6 mg/dL (ref 8.9–10.3)
Chloride: 106 mmol/L (ref 98–111)
Creatinine, Ser: 1.63 mg/dL — ABNORMAL HIGH (ref 0.61–1.24)
GFR calc Af Amer: 50 mL/min — ABNORMAL LOW (ref >60)
GFR calc non Af Amer: 43 mL/min — ABNORMAL LOW (ref >60)
Glucose, Bld: 140 mg/dL — ABNORMAL HIGH (ref 70–99)
Potassium: 4.3 mmol/L (ref 3.5–5.1)
Sodium: 137 mmol/L (ref 135–145)
Total Bilirubin: 1.2 mg/dL (ref 0.3–1.2)
Total Protein: 7.4 g/dL (ref 6.5–8.1)

## 2020-02-24 LAB — GLUCOSE, CAPILLARY: Glucose-Capillary: 127 mg/dL — ABNORMAL HIGH (ref 70–99)

## 2020-02-24 LAB — SURGICAL PCR SCREEN
MRSA, PCR: NEGATIVE
Staphylococcus aureus: POSITIVE — AB

## 2020-02-24 LAB — HEMOGLOBIN A1C
Hgb A1c MFr Bld: 6.8 % — ABNORMAL HIGH (ref 4.8–5.6)
Mean Plasma Glucose: 148.46 mg/dL

## 2020-02-24 NOTE — Progress Notes (Addendum)
PCP - douglas Schults [facility] in [place]  Chest x-ray - na EKG - 01/03/20 Stress Test - 01/10/20 ECHO - 01/16/20 Cardiac Cath - 01/21/20  Sleep Study - na  Blood Thinner Instructions:  Last dose eliquis 02/23/20   Pt. Does not check blood sugars at home.  Blood sugar 127  COVID TEST-  02/28/20  Anesthesia review: ekg/heart hx.  Patient denies shortness of breath, fever, cough and chest pain at PAT appointment   All instructions explained to the patient, with a verbal understanding of the material. Patient agrees to go over the instructions while at home for a better understanding. Patient also instructed to self quarantine after being tested for COVID-19. The opportunity to ask questions was provided.

## 2020-02-25 NOTE — Progress Notes (Signed)
Anesthesia Chart Review:  Case: [id number] Date/Time: 03/02/20 1135   Procedure: ACDF - C3-C4 (N/A ) - 3C   Anesthesia type: General   Pre-op diagnosis: Stenosis   Location: [facility] OR ROOM 58 / [facility]   Surgeons: Earnie Larsson, MD      DISCUSSION: Patient is a 69 year old male scheduled for the above procedure.  History includes never smoker, CAD (s/p CABG: LIMA-LAD, SVG-DIAG, SVG-OM2 and AVR using 23 mm Edwards Pericardial Tissue Valve 08/16/16; cath 01/04/17: occluded SVG-DIAG and SVG-CX, s/p DES native ostial DIAG and DES to distal anastomosis of LIMA-LAD; recurrent AS, mild 12/2019), Paroxysmal Afib, HTN, DMII, Hypothyroidism, GERD, dyslipidemia, anemia, intermittent claudication, skin cancer, elevated LFTs (10/2016; WNL 02/24/20), hyperactive gag reflex. Labs suggest renal insuffiency with Creatinine range 1.31-1.75 since 04/2018.   He had recent echo showing normal LVEF, mild-moderate MR, and mild AS. He had mild anterolateral ischemia on recent stress test followed by cardiac cath that showed stable anatomy with medical therapy recommended. Following 02/04/20 visit, cardiologist Dr. Geraldo Pitter wrote, "Preoperative cardiovascular evaluation: Patient has undergone coronary angiography and this has been unremarkable.  He is asymptomatic.  In view of the above is not at high risk for coronary events during the aforementioned surgery.  Meticulous hemodynamic monitoring.  Perioperative beta-blockade will further reduce the risk of coronary events.  I discussed this with the patient at length." Given recurrent palpitations/sensation of irregular heartbeat, Amiodarone resumed at patient's request (patient in afib on 01/03/20 EKG and 01/10/20 stress test). Also with upcoming neurosurgery, he felt patient could "withhold his anticoagulation for a span of time felt reasonable by his surgeons". Patient reported last Eliquis 02/23/20--Vanessa at Dr. Marchelle Folks office to follow-up to clarify instructions and will update patient if  needed.      Patient with known PAF--afib on 01/03/20 and 01/10/20 EKG, unsure about rhythm at the time of his 01/21/20 cath. 02/04/20 office visit describes heart sounds as regular. HR was 61 bpm at PAT.  Presurgical COVID-19 test is scheduled for 02/28/20. He is for PT/INR on the day of surgery. Anesthesia team to evaluate on the day of surgery.    VS: BP 125/67   Pulse 61   Temp 36.9 C (Oral)   Resp 18   Ht 5\' 10"  (1.778 m)   Wt 87 kg   SpO2 99%   BMI 27.51 kg/m    PROVIDERS: Nicoletta Dress, MD is PCP Jyl Heinz, MD is cardiologist   LABS: Labs reviewed: Acceptable for surgery. Cr 1.63, previously 1.34-1.63 since July 2020. (all labs ordered are listed, but only abnormal results are displayed)  Labs Reviewed  SURGICAL PCR SCREEN - Abnormal; Notable for the following components:      Result Value   Staphylococcus aureus POSITIVE (*)    All other components within normal limits  GLUCOSE, CAPILLARY - Abnormal; Notable for the following components:   Glucose-Capillary 127 (*)    All other components within normal limits  COMPREHENSIVE METABOLIC PANEL - Abnormal; Notable for the following components:   Glucose, Bld 140 (*)    BUN 25 (*)    Creatinine, Ser 1.63 (*)    GFR calc non Af Amer 43 (*)    GFR calc Af Amer 50 (*)    All other components within normal limits  HEMOGLOBIN A1C - Abnormal; Notable for the following components:   Hgb A1c MFr Bld 6.8 (*)    All other components within normal limits  CBC WITH DIFFERENTIAL/PLATELET     IMAGES: CT C/L-spine  12/27/19: IMPRESSION: Cervical spine: 1. Interval C4-C6 ACDF with resolved stenosis at both levels. Subsidence of the C5-C6 interbody graft without significant osseous fusion. Good fusion at C4-C5. 2. Progressive adjacent segment disease at C3-C4 with now moderate spinal canal stenosis and flattening of the cord. Lumbar spine: 1. Acute appearing fracture at the base of the L4 spinous process. Chronic  appearing bilateral L4 pars fractures, new since April 2020. 2. Interval L4-L5 posterior decompression with improved now mild to moderate spinal canal stenosis, but continued moderate left greater than right neuroforaminal stenosis. 3. Unchanged moderate bilateral subarticular neuroforaminal stenosis at L5-S1. 4. Unchanged moderate spinal canal stenosis at L2-L3 and L3-L4.   EKG: 01/03/2020: Afib at 91 bpm. Septal infarct, age undetermined. ST/T wave abnormality, consider inferior ischemia.     CV: Cardiac cath 01/21/2020:  Aortic bioprosthesis was not crossed and therefore hemodynamics in the LV were not recorded.  Patent LIMA to the LAD.  Occluded saphenous vein graft to the marginal.  Occluded saphenous vein graft to the diagonal.  Total occlusion of the mid to proximal LAD.  Patent circumflex with 50% proximal stenosis and segmental diffuse disease extending into the large obtuse marginal.  RCA is dominant.  There is diffuse irregularity noted throughout the proximal to distal vessel with up to 30% in the mid vessel. RECOMMENDATIONS:  Continue current therapy.  No change in anatomy is noted compared to the prior angiogram performed in 2019.  The abnormal nuclear study with anterolateral ischemia will be a chronic finding and in absence of symptoms does not warrant repeat invasive investigation.   Nuclear stress test 01/10/2020:  The left ventricular ejection fraction is mildly decreased (45-54%).  Nuclear stress EF: 49%.  There was no ST segment deviation noted during stress.  Defect 1: There is a small defect of mild severity present in the basal anterolateral and mid anterolateral location.  Findings consistent with ischemia.  This is a low risk study. Low risk stress nuclear study with mild anterolateral ischemia (possibly the distribution of a diagonal artery) and mildly reduced left ventricular systolic function.   Echo 01/16/2020: IMPRESSIONS  1. Left  ventricular ejection fraction, by estimation, is 55 to 60%. The  left ventricle has normal function. The left ventricle has no regional  wall motion abnormalities. Left ventricular diastolic function could not  be evaluated. Left ventricular  diastolic function could not be evaluated.  2. Right ventricular systolic function is normal. The right ventricular  size is normal. There is normal pulmonary artery systolic pressure. The  estimated right ventricular systolic pressure is [id number] mmHg.  3. The mitral valve is degenerative. Mild to moderate mitral valve  regurgitation.  4. 23 mm bioprosthetic AoV. Vmax 2.9 m/s, mean gradient 16 mmHG, EOA 1.46  cm2, DI 0.46. Gradients within normal limits. The aortic valve has been  repaired/replaced. Aortic valve regurgitation is not visualized. Mild  aortic valve stenosis. There is a 23  mm Edwards tissue valve present in the aortic position. Procedure Date:  08/16/16. Echo findings are consistent with normal structure and function  of the aortic valve prosthesis.  5. The inferior vena cava is normal in size with greater than 50%  respiratory variability, suggesting right atrial pressure of 3 mmHg.  - Comparison(s): A prior study was performed on 04/25/2018. Gradients across  bioprosthetic AoV within normal limits on this study.    Event monitor 04/25/2018-05/24/2018: Conclusion: The event monitoring was largely unremarkable. The patient had one ventricular triplet. Asymptomatic.   Carotid US 08/10/2016 (Care  Everywhere): Impressions Right Impression 1-39% stenosis in the RIGHT internal carotid artery. - There is multiphasic subclavian flow noted on the right side. - There is antegrade vertebral flow noted on the right side. Left Impression 40-59% stenosis in the Left internal carotid artery. - There is multiphasic subclavian flow noted on the left side.  - There is antegrade vertebral flow noted on the left side. (Forwarded report to Dr.  Geraldo Pitter for future follow-up purposes and left update for Lorriane Shire at Dr. Marchelle Folks office.)   Past Medical History:  Diagnosis Date  . Abnormal LFTs (liver function tests) 10/31/2016  . Abnormal nuclear stress test 05/03/2018  . Acid reflux 02/15/2018   not at present  . Acute otitis externa of left ear 11/13/2019  . Acute suppurative otitis media of left ear without spontaneous rupture of tympanic membrane 11/13/2019  . Anemia    low iron  . Aortic stenosis 05/03/2018  . Arthritis 02/15/2018   Overview:  shoulder  . Cancer ([place])    skin cancer on left wrist  . Cervical myelopathy ([place] [place]) 04/22/2019  . Conductive hearing loss of left ear 11/13/2019  . Coronary artery disease involving native coronary artery of native heart without angina pectoris 05/30/2016  . Coronary artery disease of native artery of native heart with stable angina pectoris ([place])    pt. denies  . Diabetes mellitus due to underlying condition with unspecified complications ([place]) [id number]  . Dyslipidemia 05/30/2016  . Dyspnea    upon exertion,  as of 06/05/09 not having any recent chest pain  . Dysrhythmia    a fib  . Essential hypertension 05/30/2016  . Heart murmur    Has had an Aortic valve replacement  . Hyperactive gag reflex 02/15/2018  . Hypothyroidism   . Intermittent claudication ([place]) 10/31/2018  . Lumbar stenosis with neurogenic claudication 06/11/2019  . Macular degeneration 02/15/2018  . PAF (paroxysmal atrial fibrillation) ([place]) 05/30/2016  . Pre-operative cardiovascular examination 01/03/2020  . Preoperative cardiovascular examination 04/17/2019  . Renal insufficiency 01/17/2020  . S/P aortic valve replacement 01/17/2020  . Tinnitus of left ear 11/13/2019    Past Surgical History:  Procedure Laterality Date  . ANTERIOR CERVICAL DECOMP/DISCECTOMY FUSION N/A 04/22/2019   Procedure: Anterior Cervical Discectomy and Fusion, Cervical four-five, Cervical five-six;  Surgeon: Earnie Larsson, MD;  Location: [facility];   Service: Neurosurgery;  Laterality: N/A;  . AORTIC VALVE REPLACEMENT    . CARDIAC CATHETERIZATION     several stents  . CARDIAC SURGERY    . CORONARY ARTERY BYPASS GRAFT  2017   patient unsure how many, had at [facility]  . HAND SURGERY    . KNEE SURGERY    . LEFT HEART CATH AND CORONARY ANGIOGRAPHY N/A 01/21/2020   Procedure: LEFT HEART CATH AND CORONARY ANGIOGRAPHY;  Surgeon: Belva Crome, MD;  Location: [place] [facility];  Service: Cardiovascular;  Laterality: N/A;  . LEFT HEART CATH AND CORS/GRAFTS ANGIOGRAPHY N/A 05/16/2018   Procedure: LEFT HEART CATH AND CORS/GRAFTS ANGIOGRAPHY;  Surgeon: Burnell Blanks, MD;  Location: [place] [facility];  Service: Cardiovascular;  Laterality: N/A;  . LUMBAR LAMINECTOMY/DECOMPRESSION MICRODISCECTOMY Bilateral 06/11/2019   Procedure: Bilateral Lumbar Four-FiveLaminectomy/Foraminotomy;  Surgeon: Earnie Larsson, MD;  Location: [facility];  Service: Neurosurgery;  Laterality: Bilateral;  posterior    MEDICATIONS: . amiodarone (PACERONE) 400 MG tablet  . amLODipine (NORVASC) 10 MG tablet  . apixaban (ELIQUIS) 5 MG TABS tablet  . benazepril (LOTENSIN) 40 MG tablet  . Coenzyme Q10 (  COQ10) 200 MG CAPS  . cyclobenzaprine (FLEXERIL) 10 MG tablet  . ezetimibe (ZETIA) 10 MG tablet  . furosemide (LASIX) 40 MG tablet  . levothyroxine (SYNTHROID) 25 MCG tablet  . metFORMIN (GLUCOPHAGE) 500 MG tablet  . metoprolol tartrate (LOPRESSOR) 50 MG tablet  . nitroGLYCERIN (NITROSTAT) 0.4 MG SL tablet  . vitamin B-12 (CYANOCOBALAMIN) 1000 MCG tablet  . vitamin C (ASCORBIC ACID) 500 MG tablet  . zinc gluconate 50 MG tablet   No current facility-administered medications for this encounter.     Myra Gianotti, PA-C Surgical Short Stay/Anesthesiology [facility] Phone [phone] [facility] Phone [phone] 02/25/2020 4:58 PM

## 2020-02-25 NOTE — Anesthesia Preprocedure Evaluation (Addendum)
Anesthesia Evaluation  Patient identified by MRN, date of birth, ID band Patient awake    Reviewed: Allergy & Precautions, NPO status , Patient's Chart, lab work & pertinent test results  Airway Mallampati: II  TM Distance: >3 FB     Dental   Pulmonary shortness of breath,    breath sounds clear to auscultation       Cardiovascular hypertension, + angina + CAD  + dysrhythmias + Valvular Problems/Murmurs  Rhythm:Regular Rate:Normal     Neuro/Psych    GI/Hepatic GERD  ,  Endo/Other  diabetesHypothyroidism   Renal/GU Renal disease     Musculoskeletal  (+) Arthritis ,   Abdominal   Peds  Hematology  (+) anemia ,   Anesthesia Other Findings   Reproductive/Obstetrics                           Anesthesia Physical Anesthesia Plan  ASA: III  Anesthesia Plan: General   Post-op Pain Management:    Induction: Intravenous  PONV Risk Score and Plan: 2 and Dexamethasone, Ondansetron and Midazolam  Airway Management Planned: Oral ETT  Additional Equipment: Arterial line  Intra-op Plan:   Post-operative Plan: Possible Post-op intubation/ventilation  Informed Consent: I have reviewed the patients History and Physical, chart, labs and discussed the procedure including the risks, benefits and alternatives for the proposed anesthesia with the patient or authorized representative who has indicated his/her understanding and acceptance.     Dental advisory given  Plan Discussed with: Anesthesiologist and CRNA  Anesthesia Plan Comments: (See PAT note written 02/25/2020 by Myra Gianotti, PA-C. )     Anesthesia Quick Evaluation

## 2020-02-28 ENCOUNTER — Other Ambulatory Visit (HOSPITAL_COMMUNITY)
Admission: RE | Admit: 2020-02-28 | Discharge: 2020-02-28 | Disposition: A | Discharge status: Home / Self Care | Payer: BC Managed Care – PPO | Source: Ambulatory Visit | Attending: Neurosurgery | Admitting: Neurosurgery

## 2020-02-28 DIAGNOSIS — Z20822 Contact with and (suspected) exposure to covid-19: Secondary | ICD-10-CM | POA: Insufficient documentation

## 2020-02-28 DIAGNOSIS — Z01812 Encounter for preprocedural laboratory examination: Secondary | ICD-10-CM | POA: Insufficient documentation

## 2020-02-28 LAB — SARS CORONAVIRUS 2 (TAT 6-24 HRS): SARS Coronavirus 2: NEGATIVE

## 2020-03-02 ENCOUNTER — Ambulatory Visit (HOSPITAL_COMMUNITY): Payer: BC Managed Care – PPO

## 2020-03-02 ENCOUNTER — Ambulatory Visit (HOSPITAL_COMMUNITY): Payer: BC Managed Care – PPO | Admitting: Vascular Surgery

## 2020-03-02 ENCOUNTER — Encounter (HOSPITAL_COMMUNITY)
Admission: RE | Disposition: A | Discharge status: Home / Self Care | Payer: Self-pay | Source: Home / Self Care | Attending: Neurosurgery

## 2020-03-02 ENCOUNTER — Encounter (HOSPITAL_COMMUNITY): Payer: Self-pay | Admitting: Neurosurgery

## 2020-03-02 ENCOUNTER — Other Ambulatory Visit: Payer: Self-pay

## 2020-03-02 ENCOUNTER — Observation Stay (HOSPITAL_COMMUNITY)
Admission: RE | Admit: 2020-03-02 | Discharge: 2020-03-03 | Disposition: A | Discharge status: Home / Self Care | Payer: BC Managed Care – PPO | Attending: Neurosurgery | Admitting: Neurosurgery

## 2020-03-02 DIAGNOSIS — I10 Essential (primary) hypertension: Secondary | ICD-10-CM | POA: Diagnosis not present

## 2020-03-02 DIAGNOSIS — G992 Myelopathy in diseases classified elsewhere: Secondary | ICD-10-CM | POA: Diagnosis not present

## 2020-03-02 DIAGNOSIS — Z79899 Other long term (current) drug therapy: Secondary | ICD-10-CM | POA: Insufficient documentation

## 2020-03-02 DIAGNOSIS — I2510 Atherosclerotic heart disease of native coronary artery without angina pectoris: Secondary | ICD-10-CM | POA: Insufficient documentation

## 2020-03-02 DIAGNOSIS — E039 Hypothyroidism, unspecified: Secondary | ICD-10-CM | POA: Diagnosis not present

## 2020-03-02 DIAGNOSIS — Z952 Presence of prosthetic heart valve: Secondary | ICD-10-CM | POA: Insufficient documentation

## 2020-03-02 DIAGNOSIS — H902 Conductive hearing loss, unspecified: Secondary | ICD-10-CM | POA: Diagnosis not present

## 2020-03-02 DIAGNOSIS — Z981 Arthrodesis status: Secondary | ICD-10-CM | POA: Insufficient documentation

## 2020-03-02 DIAGNOSIS — Z85828 Personal history of other malignant neoplasm of skin: Secondary | ICD-10-CM | POA: Insufficient documentation

## 2020-03-02 DIAGNOSIS — Z951 Presence of aortocoronary bypass graft: Secondary | ICD-10-CM | POA: Diagnosis not present

## 2020-03-02 DIAGNOSIS — Z7901 Long term (current) use of anticoagulants: Secondary | ICD-10-CM | POA: Diagnosis not present

## 2020-03-02 DIAGNOSIS — E119 Type 2 diabetes mellitus without complications: Secondary | ICD-10-CM | POA: Insufficient documentation

## 2020-03-02 DIAGNOSIS — M4802 Spinal stenosis, cervical region: Secondary | ICD-10-CM | POA: Diagnosis not present

## 2020-03-02 DIAGNOSIS — M4322 Fusion of spine, cervical region: Secondary | ICD-10-CM | POA: Diagnosis not present

## 2020-03-02 DIAGNOSIS — I480 Paroxysmal atrial fibrillation: Secondary | ICD-10-CM | POA: Diagnosis not present

## 2020-03-02 DIAGNOSIS — M5001 Cervical disc disorder with myelopathy,  high cervical region: Secondary | ICD-10-CM | POA: Diagnosis not present

## 2020-03-02 DIAGNOSIS — Z419 Encounter for procedure for purposes other than remedying health state, unspecified: Secondary | ICD-10-CM

## 2020-03-02 DIAGNOSIS — Z79890 Hormone replacement therapy (postmenopausal): Secondary | ICD-10-CM | POA: Insufficient documentation

## 2020-03-02 DIAGNOSIS — Z7984 Long term (current) use of oral hypoglycemic drugs: Secondary | ICD-10-CM | POA: Diagnosis not present

## 2020-03-02 DIAGNOSIS — E785 Hyperlipidemia, unspecified: Secondary | ICD-10-CM | POA: Insufficient documentation

## 2020-03-02 DIAGNOSIS — G959 Disease of spinal cord, unspecified: Secondary | ICD-10-CM | POA: Diagnosis present

## 2020-03-02 DIAGNOSIS — I25118 Atherosclerotic heart disease of native coronary artery with other forms of angina pectoris: Secondary | ICD-10-CM | POA: Diagnosis not present

## 2020-03-02 HISTORY — PX: ANTERIOR CERVICAL DECOMP/DISCECTOMY FUSION: SHX1161

## 2020-03-02 LAB — GLUCOSE, CAPILLARY
Glucose-Capillary: 203 mg/dL — ABNORMAL HIGH (ref 70–99)
Glucose-Capillary: 192 mg/dL — ABNORMAL HIGH (ref 70–99)
Glucose-Capillary: 263 mg/dL — ABNORMAL HIGH (ref 70–99)
Glucose-Capillary: 344 mg/dL — ABNORMAL HIGH (ref 70–99)

## 2020-03-02 LAB — PROTIME-INR
INR: 1.2 (ref 0.8–1.2)
Prothrombin Time: 14.7 seconds (ref 11.4–15.2)

## 2020-03-02 SURGERY — ANTERIOR CERVICAL DECOMPRESSION/DISCECTOMY FUSION 1 LEVEL
Anesthesia: General

## 2020-03-02 MED ORDER — LIDOCAINE 2% (20 MG/ML) 5 ML SYRINGE
INTRAMUSCULAR | Status: DC | PRN
  Administered 2020-03-02: 20 mg via INTRAVENOUS
  Administered 2020-03-02: 80 mg via INTRAVENOUS

## 2020-03-02 MED ORDER — ONDANSETRON HCL 4 MG/2ML IJ SOLN
INTRAMUSCULAR | Status: DC | PRN
  Administered 2020-03-02: 4 mg via INTRAVENOUS

## 2020-03-02 MED ORDER — CEFAZOLIN SODIUM-DEXTROSE 2-4 GM/100ML-% IV SOLN
2.0000 g | INTRAVENOUS | Status: AC
  Administered 2020-03-02: 2 g via INTRAVENOUS

## 2020-03-02 MED ORDER — CEFAZOLIN SODIUM-DEXTROSE 2-4 GM/100ML-% IV SOLN
INTRAVENOUS | Status: AC
  Filled 2020-03-02: qty 100

## 2020-03-02 MED ORDER — ZINC SULFATE 220 (50 ZN) MG PO CAPS
220.0000 mg | ORAL_CAPSULE | Freq: Every day | ORAL | Status: DC
  Administered 2020-03-03: 220 mg via ORAL
  Filled 2020-03-02: qty 1

## 2020-03-02 MED ORDER — INSULIN ASPART 100 UNIT/ML ~~LOC~~ SOLN: 0.0000 [IU] | Freq: Three times a day (TID) | SUBCUTANEOUS | Status: DC

## 2020-03-02 MED ORDER — EZETIMIBE 10 MG PO TABS
10.0000 mg | ORAL_TABLET | Freq: Every day | ORAL | Status: DC
  Administered 2020-03-02: 10 mg via ORAL
  Filled 2020-03-02 (×2): qty 1

## 2020-03-02 MED ORDER — SODIUM CHLORIDE 0.9% FLUSH
3.0000 mL | Freq: Two times a day (BID) | INTRAVENOUS | Status: DC
  Administered 2020-03-02: 3 mL via INTRAVENOUS

## 2020-03-02 MED ORDER — COQ10 200 MG PO CAPS: 200.0000 mg | ORAL_CAPSULE | Freq: Every day | ORAL | Status: DC

## 2020-03-02 MED ORDER — FENTANYL CITRATE (PF) 250 MCG/5ML IJ SOLN
INTRAMUSCULAR | Status: DC | PRN
  Administered 2020-03-02: 25 ug via INTRAVENOUS
  Administered 2020-03-02: 50 ug via INTRAVENOUS
  Administered 2020-03-02: 125 ug via INTRAVENOUS

## 2020-03-02 MED ORDER — METOPROLOL TARTRATE 25 MG PO TABS: 50.0000 mg | ORAL_TABLET | Freq: Three times a day (TID) | ORAL | Status: DC | PRN

## 2020-03-02 MED ORDER — 0.9 % SODIUM CHLORIDE (POUR BTL) OPTIME
TOPICAL | Status: DC | PRN
  Administered 2020-03-02: 1000 mL

## 2020-03-02 MED ORDER — PROPOFOL 10 MG/ML IV BOLUS
INTRAVENOUS | Status: DC | PRN
  Administered 2020-03-02: 140 mg via INTRAVENOUS

## 2020-03-02 MED ORDER — INSULIN ASPART 100 UNIT/ML ~~LOC~~ SOLN
0.0000 [IU] | Freq: Three times a day (TID) | SUBCUTANEOUS | Status: DC
  Administered 2020-03-02 – 2020-03-03 (×2): 8 [IU] via SUBCUTANEOUS

## 2020-03-02 MED ORDER — PHENYLEPHRINE 40 MCG/ML (10ML) SYRINGE FOR IV PUSH (FOR BLOOD PRESSURE SUPPORT)
PREFILLED_SYRINGE | INTRAVENOUS | Status: AC
  Filled 2020-03-02: qty 10

## 2020-03-02 MED ORDER — SODIUM CHLORIDE 0.9 % IV SOLN: INTRAVENOUS | Status: DC | PRN

## 2020-03-02 MED ORDER — VITAMIN B-12 1000 MCG PO TABS
1000.0000 ug | ORAL_TABLET | Freq: Every day | ORAL | Status: DC
  Administered 2020-03-03: 1000 ug via ORAL
  Filled 2020-03-02: qty 1

## 2020-03-02 MED ORDER — FENTANYL CITRATE (PF) 250 MCG/5ML IJ SOLN
INTRAMUSCULAR | Status: AC
  Filled 2020-03-02: qty 5

## 2020-03-02 MED ORDER — CEFAZOLIN SODIUM-DEXTROSE 1-4 GM/50ML-% IV SOLN
1.0000 g | Freq: Three times a day (TID) | INTRAVENOUS | Status: AC
  Administered 2020-03-02 – 2020-03-03 (×2): 1 g via INTRAVENOUS
  Filled 2020-03-02 (×2): qty 50

## 2020-03-02 MED ORDER — MENTHOL 3 MG MT LOZG: 1.0000 | LOZENGE | OROMUCOSAL | Status: DC | PRN

## 2020-03-02 MED ORDER — SUCCINYLCHOLINE CHLORIDE 200 MG/10ML IV SOSY
PREFILLED_SYRINGE | INTRAVENOUS | Status: DC | PRN
  Administered 2020-03-02: 140 mg via INTRAVENOUS

## 2020-03-02 MED ORDER — FUROSEMIDE 40 MG PO TABS
40.0000 mg | ORAL_TABLET | Freq: Every day | ORAL | Status: DC
  Administered 2020-03-03: 40 mg via ORAL
  Filled 2020-03-02: qty 1

## 2020-03-02 MED ORDER — PHENOL 1.4 % MT LIQD: 1.0000 | OROMUCOSAL | Status: DC | PRN

## 2020-03-02 MED ORDER — THROMBIN 5000 UNITS EX SOLR
CUTANEOUS | Status: DC | PRN
  Administered 2020-03-02 (×2): 5000 [IU] via TOPICAL

## 2020-03-02 MED ORDER — ONDANSETRON HCL 4 MG PO TABS: 4.0000 mg | ORAL_TABLET | Freq: Four times a day (QID) | ORAL | Status: DC | PRN

## 2020-03-02 MED ORDER — HEMOSTATIC AGENTS (NO CHARGE) OPTIME
TOPICAL | Status: DC | PRN
  Administered 2020-03-02: 1 via TOPICAL

## 2020-03-02 MED ORDER — SUCCINYLCHOLINE CHLORIDE 200 MG/10ML IV SOSY
PREFILLED_SYRINGE | INTRAVENOUS | Status: AC
  Filled 2020-03-02: qty 10

## 2020-03-02 MED ORDER — INSULIN ASPART 100 UNIT/ML ~~LOC~~ SOLN
0.0000 [IU] | Freq: Every day | SUBCUTANEOUS | Status: DC
  Administered 2020-03-02: 4 [IU] via SUBCUTANEOUS

## 2020-03-02 MED ORDER — INSULIN ASPART 100 UNIT/ML ~~LOC~~ SOLN: 0.0000 [IU] | Freq: Every day | SUBCUTANEOUS | Status: DC

## 2020-03-02 MED ORDER — ROCURONIUM BROMIDE 50 MG/5ML IV SOSY
PREFILLED_SYRINGE | INTRAVENOUS | Status: DC | PRN
  Administered 2020-03-02: 50 mg via INTRAVENOUS
  Administered 2020-03-02: 10 mg via INTRAVENOUS

## 2020-03-02 MED ORDER — LEVOTHYROXINE SODIUM 25 MCG PO TABS
25.0000 ug | ORAL_TABLET | Freq: Every day | ORAL | Status: DC
  Administered 2020-03-03: 25 ug via ORAL
  Filled 2020-03-02 (×2): qty 1

## 2020-03-02 MED ORDER — AMLODIPINE BESYLATE 5 MG PO TABS: 10.0000 mg | ORAL_TABLET | Freq: Every day | ORAL | Status: DC

## 2020-03-02 MED ORDER — ONDANSETRON HCL 4 MG/2ML IJ SOLN: 4.0000 mg | Freq: Four times a day (QID) | INTRAMUSCULAR | Status: DC | PRN

## 2020-03-02 MED ORDER — HYDROMORPHONE HCL 1 MG/ML IJ SOLN: 1.0000 mg | INTRAMUSCULAR | Status: DC | PRN

## 2020-03-02 MED ORDER — ACETAMINOPHEN 325 MG PO TABS
ORAL_TABLET | ORAL | Status: AC
  Filled 2020-03-02: qty 2

## 2020-03-02 MED ORDER — METFORMIN HCL 500 MG PO TABS
500.0000 mg | ORAL_TABLET | Freq: Every day | ORAL | Status: DC
  Administered 2020-03-02: 500 mg via ORAL
  Filled 2020-03-02: qty 1

## 2020-03-02 MED ORDER — SODIUM CHLORIDE 0.9 % IV SOLN
INTRAVENOUS | Status: DC | PRN
  Administered 2020-03-02: 20 ug/min via INTRAVENOUS

## 2020-03-02 MED ORDER — PHENYLEPHRINE HCL (PRESSORS) 10 MG/ML IV SOLN
INTRAVENOUS | Status: DC | PRN
  Administered 2020-03-02: 120 ug via INTRAVENOUS
  Administered 2020-03-02: 80 ug via INTRAVENOUS

## 2020-03-02 MED ORDER — LACTATED RINGERS IV SOLN: INTRAVENOUS | Status: DC | PRN

## 2020-03-02 MED ORDER — MIDAZOLAM HCL 5 MG/5ML IJ SOLN
INTRAMUSCULAR | Status: DC | PRN
  Administered 2020-03-02: 2 mg via INTRAVENOUS

## 2020-03-02 MED ORDER — HYDROCODONE-ACETAMINOPHEN 5-325 MG PO TABS
1.0000 | ORAL_TABLET | ORAL | Status: DC | PRN
  Administered 2020-03-02: 1 via ORAL
  Filled 2020-03-02: qty 1

## 2020-03-02 MED ORDER — DEXAMETHASONE SODIUM PHOSPHATE 10 MG/ML IJ SOLN
10.0000 mg | Freq: Once | INTRAMUSCULAR | Status: AC
  Administered 2020-03-02: 10 mg via INTRAVENOUS

## 2020-03-02 MED ORDER — EPHEDRINE SULFATE 50 MG/ML IJ SOLN
INTRAMUSCULAR | Status: DC | PRN
  Administered 2020-03-02: 5 mg via INTRAVENOUS
  Administered 2020-03-02: 10 mg via INTRAVENOUS
  Administered 2020-03-02 (×2): 5 mg via INTRAVENOUS

## 2020-03-02 MED ORDER — SODIUM CHLORIDE 0.9% FLUSH: 3.0000 mL | INTRAVENOUS | Status: DC | PRN

## 2020-03-02 MED ORDER — PROPOFOL 10 MG/ML IV BOLUS
INTRAVENOUS | Status: AC
  Filled 2020-03-02: qty 20

## 2020-03-02 MED ORDER — BENAZEPRIL HCL 40 MG PO TABS
40.0000 mg | ORAL_TABLET | Freq: Every day | ORAL | Status: DC
  Filled 2020-03-02 (×2): qty 1

## 2020-03-02 MED ORDER — LIDOCAINE 2% (20 MG/ML) 5 ML SYRINGE
INTRAMUSCULAR | Status: AC
  Filled 2020-03-02: qty 5

## 2020-03-02 MED ORDER — SODIUM CHLORIDE 0.9 % IV SOLN: 250.0000 mL | INTRAVENOUS | Status: DC

## 2020-03-02 MED ORDER — ASCORBIC ACID 500 MG PO TABS
500.0000 mg | ORAL_TABLET | Freq: Every day | ORAL | Status: DC
  Administered 2020-03-03: 09:00:00 500 mg via ORAL
  Filled 2020-03-02: qty 1

## 2020-03-02 MED ORDER — CHLORHEXIDINE GLUCONATE CLOTH 2 % EX PADS: 6.0000 | MEDICATED_PAD | Freq: Once | CUTANEOUS | Status: DC

## 2020-03-02 MED ORDER — SUGAMMADEX SODIUM 200 MG/2ML IV SOLN
INTRAVENOUS | Status: DC | PRN
  Administered 2020-03-02: 200 mg via INTRAVENOUS

## 2020-03-02 MED ORDER — DEXAMETHASONE SODIUM PHOSPHATE 10 MG/ML IJ SOLN
INTRAMUSCULAR | Status: AC
  Filled 2020-03-02: qty 1

## 2020-03-02 MED ORDER — CYCLOBENZAPRINE HCL 10 MG PO TABS
ORAL_TABLET | ORAL | Status: AC
  Filled 2020-03-02: qty 1

## 2020-03-02 MED ORDER — CYCLOBENZAPRINE HCL 10 MG PO TABS
10.0000 mg | ORAL_TABLET | Freq: Three times a day (TID) | ORAL | Status: DC | PRN
  Administered 2020-03-02: 15:00:00 10 mg via ORAL
  Filled 2020-03-02: qty 1

## 2020-03-02 MED ORDER — THROMBIN 5000 UNITS EX SOLR
CUTANEOUS | Status: AC
  Filled 2020-03-02: qty 10000

## 2020-03-02 MED ORDER — ACETAMINOPHEN 325 MG PO TABS
650.0000 mg | ORAL_TABLET | ORAL | Status: DC | PRN
  Administered 2020-03-02: 650 mg via ORAL
  Filled 2020-03-02: qty 2

## 2020-03-02 MED ORDER — FENTANYL CITRATE (PF) 100 MCG/2ML IJ SOLN: 25.0000 ug | INTRAMUSCULAR | Status: DC | PRN

## 2020-03-02 MED ORDER — ONDANSETRON HCL 4 MG/2ML IJ SOLN
INTRAMUSCULAR | Status: AC
  Filled 2020-03-02: qty 2

## 2020-03-02 MED ORDER — MIDAZOLAM HCL 2 MG/2ML IJ SOLN
INTRAMUSCULAR | Status: AC
  Filled 2020-03-02: qty 2

## 2020-03-02 MED ORDER — ACETAMINOPHEN 650 MG RE SUPP
650.0000 mg | RECTAL | Status: DC | PRN
  Filled 2020-03-02: qty 1

## 2020-03-02 MED ORDER — AMIODARONE HCL 200 MG PO TABS
200.0000 mg | ORAL_TABLET | Freq: Every day | ORAL | Status: DC
  Administered 2020-03-02: 200 mg via ORAL
  Filled 2020-03-02 (×2): qty 1

## 2020-03-02 MED ORDER — ROCURONIUM BROMIDE 10 MG/ML (PF) SYRINGE
PREFILLED_SYRINGE | INTRAVENOUS | Status: AC
  Filled 2020-03-02: qty 10

## 2020-03-02 MED ORDER — NITROGLYCERIN 0.4 MG SL SUBL: 0.4000 mg | SUBLINGUAL_TABLET | SUBLINGUAL | Status: DC | PRN

## 2020-03-02 MED ORDER — HYDROCODONE-ACETAMINOPHEN 10-325 MG PO TABS: 2.0000 | ORAL_TABLET | ORAL | Status: DC | PRN

## 2020-03-02 SURGICAL SUPPLY — 50 items
BAG DECANTER FOR FLEXI CONT (MISCELLANEOUS) ×2 IMPLANT
BENZOIN TINCTURE PRP APPL 2/3 (GAUZE/BANDAGES/DRESSINGS) ×2 IMPLANT
BIT DRILL 13 (BIT) ×1 IMPLANT
BUR MATCHSTICK NEURO 3.0 LAGG (BURR) ×2 IMPLANT
CAGE PEEK 8X14X11 (Cage) ×1 IMPLANT
CANISTER SUCT 3000ML PPV (MISCELLANEOUS) ×2 IMPLANT
CARTRIDGE OIL MAESTRO DRILL (MISCELLANEOUS) ×1 IMPLANT
COVER WAND RF STERILE (DRAPES) ×2 IMPLANT
DIFFUSER DRILL AIR PNEUMATIC (MISCELLANEOUS) ×2 IMPLANT
DRAPE C-ARM 42X72 X-RAY (DRAPES) ×4 IMPLANT
DRAPE LAPAROTOMY 100X72 PEDS (DRAPES) ×2 IMPLANT
DRAPE MICROSCOPE LEICA (MISCELLANEOUS) ×2 IMPLANT
DURAPREP 6ML APPLICATOR 50/CS (WOUND CARE) ×2 IMPLANT
ELECT COATED BLADE 2.86 ST (ELECTRODE) ×2 IMPLANT
ELECT REM PT RETURN 9FT ADLT (ELECTROSURGICAL) ×2
ELECTRODE REM PT RTRN 9FT ADLT (ELECTROSURGICAL) ×1 IMPLANT
GAUZE 4X4 16PLY RFD (DISPOSABLE) IMPLANT
GAUZE SPONGE 4X4 12PLY STRL (GAUZE/BANDAGES/DRESSINGS) ×2 IMPLANT
GLOVE ECLIPSE 9.0 STRL (GLOVE) ×2 IMPLANT
GLOVE EXAM NITRILE XL STR (GLOVE) IMPLANT
GOWN STRL REUS W/ TWL LRG LVL3 (GOWN DISPOSABLE) IMPLANT
GOWN STRL REUS W/ TWL XL LVL3 (GOWN DISPOSABLE) IMPLANT
GOWN STRL REUS W/TWL 2XL LVL3 (GOWN DISPOSABLE) IMPLANT
GOWN STRL REUS W/TWL LRG LVL3 (GOWN DISPOSABLE)
GOWN STRL REUS W/TWL XL LVL3 (GOWN DISPOSABLE)
HALTER HD/CHIN CERV TRACTION D (MISCELLANEOUS) ×2 IMPLANT
HEMOSTAT POWDER KIT SURGIFOAM (HEMOSTASIS) IMPLANT
KIT BASIN OR (CUSTOM PROCEDURE TRAY) ×2 IMPLANT
KIT TURNOVER KIT B (KITS) ×2 IMPLANT
NDL SPNL 20GX3.5 QUINCKE YW (NEEDLE) ×1 IMPLANT
NEEDLE SPNL 20GX3.5 QUINCKE YW (NEEDLE) ×2 IMPLANT
NS IRRIG 1000ML POUR BTL (IV SOLUTION) ×2 IMPLANT
OIL CARTRIDGE MAESTRO DRILL (MISCELLANEOUS) ×2
PACK LAMINECTOMY NEURO (CUSTOM PROCEDURE TRAY) ×2 IMPLANT
PAD ARMBOARD 7.5X6 YLW CONV (MISCELLANEOUS) ×6 IMPLANT
PLATE VISION ELITE 27.5MM (Plate) ×1 IMPLANT
RUBBERBAND STERILE (MISCELLANEOUS) ×4 IMPLANT
SCREW ST 13X4XST VA NS SPNE (Screw) IMPLANT
SCREW ST VAR 4 ATL (Screw) ×4 IMPLANT
SPONGE INTESTINAL PEANUT (DISPOSABLE) ×2 IMPLANT
SPONGE SURGIFOAM ABS GEL SZ50 (HEMOSTASIS) ×2 IMPLANT
STRIP CLOSURE SKIN 1/2X4 (GAUZE/BANDAGES/DRESSINGS) ×2 IMPLANT
SUT VIC AB 3-0 SH 8-18 (SUTURE) ×2 IMPLANT
SUT VIC AB 4-0 RB1 18 (SUTURE) ×2 IMPLANT
TAPE CLOTH 4X10 WHT NS (GAUZE/BANDAGES/DRESSINGS) ×2 IMPLANT
TAPE CLOTH SURG 4X10 WHT LF (GAUZE/BANDAGES/DRESSINGS) ×1 IMPLANT
TOWEL GREEN STERILE (TOWEL DISPOSABLE) ×2 IMPLANT
TOWEL GREEN STERILE FF (TOWEL DISPOSABLE) ×2 IMPLANT
TRAP SPECIMEN MUCOUS 40CC (MISCELLANEOUS) ×2 IMPLANT
WATER STERILE IRR 1000ML POUR (IV SOLUTION) ×2 IMPLANT

## 2020-03-02 NOTE — Transfer of Care (Signed)
Immediate Anesthesia Transfer of Care Note  Patient: Johnathan Murray  Procedure(s) Performed: Anterior Cervical Decompression Fusion  - Cervical three-Cervical four (N/A )  Patient Location: PACU  Anesthesia Type:General  Level of Consciousness: awake, alert , oriented, patient cooperative and responds to stimulation  Airway & Oxygen Therapy: Patient Spontanous Breathing and Patient connected to nasal cannula oxygen  Post-op Assessment: Report given to RN, Post -op Vital signs reviewed and stable and Patient moving all extremities X 4  Post vital signs: Reviewed and stable  Last Vitals:  Vitals Value Taken Time  BP 107/64 03/02/20 1357  Temp    Pulse 86 03/02/20 1400  Resp 20 03/02/20 1400  SpO2 100 % 03/02/20 1400  Vitals shown include unvalidated device data.  Last Pain:  Vitals:   03/02/20 1011  TempSrc:   PainSc: 3       Patients Stated Pain Goal: 3 ([id number])  Complications: No apparent anesthesia complications

## 2020-03-02 NOTE — Anesthesia Procedure Notes (Signed)
Procedure Name: Intubation Date/Time: 03/02/2020 12:13 PM Performed by: Glynda Jaeger, CRNA Pre-anesthesia Checklist: Patient identified, Emergency Drugs available, Suction available and Patient being monitored Patient Re-evaluated:Patient Re-evaluated prior to induction Oxygen Delivery Method: Circle System Utilized Preoxygenation: Pre-oxygenation with 100% oxygen Induction Type: IV induction Ventilation: Mask ventilation without difficulty Laryngoscope Size: Mac and 4 Grade View: Grade II Tube type: Oral Tube size: 7.5 mm Number of attempts: 1 Airway Equipment and Method: Stylet and Oral airway Placement Confirmation: ETT inserted through vocal cords under direct vision,  positive ETCO2 and breath sounds checked- equal and bilateral Secured at: 23 cm Tube secured with: Tape Dental Injury: Teeth and Oropharynx as per pre-operative assessment  Comments: [place] performed

## 2020-03-02 NOTE — H&P (Signed)
Johnathan Murray is an 69 y.o. male.   Chief Complaint: Weakness HPI: 69 year old male with multiple medical problems presents with worsening bilateral upper and lower extremity weakness sensory loss and changes in coordination.  Patient is status post prior C4-C6 anterior cervical decompression and fusion for treatment of a compressive cervical myelopathy.  Patient has undergone follow-up cervical myelogram and CT scan which demonstrates evidence of severe stenosis with marked cord compression at C3-4.  Patient presents now for C3-4 anterior cervical decompression and fusion in hopes of improving his symptoms.  Past Medical History:  Diagnosis Date  . Abnormal LFTs (liver function tests) 10/31/2016  . Abnormal nuclear stress test 05/03/2018  . Acid reflux 02/15/2018   not at present  . Acute otitis externa of left ear 11/13/2019  . Acute suppurative otitis media of left ear without spontaneous rupture of tympanic membrane 11/13/2019  . Anemia    low iron  . Aortic stenosis 05/03/2018  . Arthritis 02/15/2018   Overview:  shoulder  . Cancer ([place])    skin cancer on left wrist  . Cervical myelopathy ([place]) 04/22/2019  . Conductive hearing loss of left ear 11/13/2019  . Coronary artery disease involving native coronary artery of native heart without angina pectoris 05/30/2016  . Coronary artery disease of native artery of native heart with stable angina pectoris ([place])    pt. denies  . Diabetes mellitus due to underlying condition with unspecified complications ([place]) [id number]  . Dyslipidemia 05/30/2016  . Dyspnea    upon exertion,  as of 06/05/09 not having any recent chest pain  . Dysrhythmia    a fib  . Essential hypertension 05/30/2016  . Heart murmur    Has had an Aortic valve replacement  . Hyperactive gag reflex 02/15/2018  . Hypothyroidism   . Intermittent claudication ([place]) 10/31/2018  . Lumbar stenosis with neurogenic claudication 06/11/2019  . Macular degeneration 02/15/2018  . PAF  (paroxysmal atrial fibrillation) ([place]) 05/30/2016  . Pre-operative cardiovascular examination 01/03/2020  . Preoperative cardiovascular examination 04/17/2019  . Renal insufficiency 01/17/2020  . S/P aortic valve replacement 01/17/2020  . Tinnitus of left ear 11/13/2019    Past Surgical History:  Procedure Laterality Date  . ANTERIOR CERVICAL DECOMP/DISCECTOMY FUSION N/A 04/22/2019   Procedure: Anterior Cervical Discectomy and Fusion, Cervical four-five, Cervical five-six;  Surgeon: Earnie Larsson, MD;  Location: [place];  Service: Neurosurgery;  Laterality: N/A;  . AORTIC VALVE REPLACEMENT    . CARDIAC CATHETERIZATION     several stents  . CARDIAC SURGERY    . CORONARY ARTERY BYPASS GRAFT  2017   patient unsure how many, had at [facility]  . HAND SURGERY    . KNEE SURGERY    . LEFT HEART CATH AND CORONARY ANGIOGRAPHY N/A 01/21/2020   Procedure: LEFT HEART CATH AND CORONARY ANGIOGRAPHY;  Surgeon: Belva Crome, MD;  Location: [place] [facility];  Service: Cardiovascular;  Laterality: N/A;  . LEFT HEART CATH AND CORS/GRAFTS ANGIOGRAPHY N/A 05/16/2018   Procedure: LEFT HEART CATH AND CORS/GRAFTS ANGIOGRAPHY;  Surgeon: Burnell Blanks, MD;  Location: [place] [facility];  Service: Cardiovascular;  Laterality: N/A;  . LUMBAR LAMINECTOMY/DECOMPRESSION MICRODISCECTOMY Bilateral 06/11/2019   Procedure: Bilateral Lumbar Four-FiveLaminectomy/Foraminotomy;  Surgeon: Earnie Larsson, MD;  Location: [place];  Service: Neurosurgery;  Laterality: Bilateral;  posterior    Family History  Problem Relation Age of Onset  . Prostate cancer Brother   . Heart disease Maternal Grandmother   . Brain cancer Mother  Social History:  reports that he has never smoked. He has never used smokeless tobacco. He reports previous alcohol use. He reports that he does not use drugs.  Allergies:  Allergies  Allergen Reactions  . Atorvastatin Other (See Comments)    Urinary retention   . Nebivolol Diarrhea     Medications Prior to Admission  Medication Sig Dispense Refill  . amiodarone (PACERONE) 400 MG tablet Take 1 tablet (400 mg total) by mouth daily. Take 400 mg for 2 weeks then decrease to 200 mg daily. (Patient taking differently: Take 200 mg by mouth at bedtime. ) 30 tablet 6  . amLODipine (NORVASC) 10 MG tablet Take 10 mg by mouth at bedtime.     Marland Kitchen apixaban (ELIQUIS) 5 MG TABS tablet Take 1 tablet (5 mg total) by mouth 2 (two) times daily. 180 tablet 3  . benazepril (LOTENSIN) 40 MG tablet Take 40 mg by mouth at bedtime.   3  . Coenzyme Q10 (COQ10) 200 MG CAPS Take 200 mg by mouth daily.     Marland Kitchen ezetimibe (ZETIA) 10 MG tablet Take 1 tablet (10 mg total) by mouth at bedtime. 90 tablet 1  . furosemide (LASIX) 40 MG tablet Take 40 mg by mouth daily.  3  . levothyroxine (SYNTHROID) 25 MCG tablet Take 25 mcg by mouth daily before breakfast.     . metFORMIN (GLUCOPHAGE) 500 MG tablet Take 500 mg by mouth at bedtime.     . metoprolol tartrate (LOPRESSOR) 50 MG tablet Take 1 tablet (50 mg total) by mouth daily as needed. (Patient taking differently: Take 50 mg by mouth 3 (three) times daily as needed (afib). ) 30 tablet 3  . nitroGLYCERIN (NITROSTAT) 0.4 MG SL tablet Place 1 tablet (0.4 mg total) under the tongue every 5 (five) minutes as needed for chest pain. 25 tablet 6  . vitamin B-12 (CYANOCOBALAMIN) 1000 MCG tablet Take 1,000 mcg by mouth daily.    . vitamin C (ASCORBIC ACID) 500 MG tablet Take 500 mg by mouth daily.     Marland Kitchen zinc gluconate 50 MG tablet Take 50 mg by mouth daily.    . cyclobenzaprine (FLEXERIL) 10 MG tablet Take 1 tablet (10 mg total) by mouth at bedtime. (Patient not taking: Reported on 01/20/2020) 30 tablet 3    Results for orders placed or performed during the hospital encounter of 03/02/20 (from the past 48 hour(s))  Glucose, capillary     Status: Abnormal   Collection Time: 03/02/20  9:45 AM  Result Value Ref Range   Glucose-Capillary 203 (H) 70 - 99 mg/dL    Comment:  Glucose reference range applies only to samples taken after fasting for at least 8 hours.   Comment 1 Notify RN   Protime-INR     Status: None   Collection Time: 03/02/20  9:54 AM  Result Value Ref Range   Prothrombin Time 14.7 11.4 - 15.2 seconds   INR 1.2 0.8 - 1.2    Comment: (NOTE) INR goal varies based on device and disease states. Performed at [facility], [place] [address] [place], [place] [address]    No results found.  Pertinent items noted in HPI and remainder of comprehensive ROS otherwise negative.  Blood pressure (!) 116/59, pulse 89, temperature 98.7 F (37.1 C), temperature source Oral, resp. rate 18, weight 86.2 kg, SpO2 99 %.  Patient is awake and alert.  He is oriented and appropriate.  Speech is fluent.  Judgment insight are intact.  Cranial  nerve function normal bilateral.  Motor examination extremities demonstrates weakness of his grips and intrinsics function bilaterally grading at 4/5.  He also has some mild weakness of his lower extremities bilaterally.  Reflexes are normal.  Gait is antalgic.  Posture is mildly flexed peer examination head ears eyes nose throat is unremarkable her chest and abdomen are benign.  Extremities are free from injury deformity. Assessment/Plan C3-4 stenosis with myelopathy.  Plan C3-4 anterior cervical discectomy with interbody fusion utilizing interbody cage, locally harvested autograft, and anterior plate instrumentation.  Risks and benefits of been explained.  Patient wishes to proceed.  Mallie Mussel A Chasey Dull 03/02/2020, 11:32 AM

## 2020-03-02 NOTE — Anesthesia Procedure Notes (Signed)
Arterial Line Insertion Start/End4/10/2020 11:00 AM, 03/02/2020 11:15 AM Performed by: Belinda Block, MD, Alain Marion, CRNA, CRNA  Patient location: Pre-op. Preanesthetic checklist: patient identified, risks and benefits discussed, monitors and equipment checked and pre-op evaluation Lidocaine 1% used for infiltration radial was placed Catheter size: 20 G Hand hygiene performed  and maximum sterile barriers used   Attempts: 2 Procedure performed without using ultrasound guided technique. Ultrasound Notes:anatomy identified, needle tip was noted to be adjacent to the nerve/plexus identified and no ultrasound evidence of intravascular and/or intraneural injection Following insertion, dressing applied and Biopatch. Post procedure assessment: normal  Patient tolerated the procedure well with no immediate complications.

## 2020-03-02 NOTE — Brief Op Note (Signed)
03/02/2020  1:48 PM  PATIENT:  Johnathan Murray  69 y.o. male  PRE-OPERATIVE DIAGNOSIS:  Stenosis  POST-OPERATIVE DIAGNOSIS:  Stenosis  PROCEDURE:  Procedure(s): Anterior Cervical Decompression Fusion  - Cervical three-Cervical four (N/A)  SURGEON:  Surgeon(s) and Role:    * Earnie Larsson, MD - Primary    * Ostergard, Joyice Faster, MD - Assisting  PHYSICIAN ASSISTANT:   ASSISTANTS: Bergman<NP   ANESTHESIA:   general  EBL:  200 mL   BLOOD ADMINISTERED:none  DRAINS: none   LOCAL MEDICATIONS USED:  NONE  SPECIMEN:  No Specimen  DISPOSITION OF SPECIMEN:  N/A  COUNTS:  YES  TOURNIQUET:  * No tourniquets in log *  DICTATION: .Dragon Dictation  PLAN OF CARE: Admit for overnight observation  PATIENT DISPOSITION:  PACU - hemodynamically stable.   Delay start of Pharmacological VTE agent (>24hrs) due to surgical blood loss or risk of bleeding: yes

## 2020-03-02 NOTE — Op Note (Signed)
Date of procedure: 03/02/2020  Date of dictation: Same  Service: Neurosurgery  Preoperative diagnosis: Cervical stenosis with myelopathy  Postoperative diagnosis: Same  Procedure Name: C3-4 anterior cervical discectomy with interbody fusion utilizing interbody peek cage, locally harvested autograft, and anterior plate instrumentation.  Removal of C4-C6 anterior cervical plate.  Surgeon:Dominick Morella A.Mayjor Ager, M.D.  Asst. Surgeon: Maryan Rued, NP  Anesthesia: General  Indication: 69 year old male with history of C4-C6 anterior cervical decompression and fusion.  Patient with progressive bilateral upper extremity numbness paresthesias and weakness and increasing lower extremity numbness paresthesias and spasticity.  Work-up demonstrates evidence of marked cervical stenosis at C3-4.  Patient presents now for decompression and fusion in hopes of improving his symptoms.  Operative note: After induction anesthesia, patient positioned supine with neck slightly extended and held in place with halter traction.  Patient's anterior cervical region prepped and draped sterilely.  Incision made overlying C5 5.  Dissection performed on the right.  Retractor placed.  Fluoroscopy used.  Levels confirmed.  Previously placed anterior cervical plate from [id number] was dissected free, disassembled and removed.  Fusions at C4-5 and C5-6 were inspected and found to be solid.  Attention then placed at the C3-4 level.  This patient then incised with a 15 blade.  Discectomy then performed using various instruments removing disc material down to level the posterior annulus.  Microscope was then brought to the field used throughout the remainder of the discectomy.  Remaining aspects annulus and osteophytes removed using high-speed drill down to level the posterior logical ligament.  Posterior logical was elevated and resected in piecemeal fashion using Kerrison rongeurs.  A wide central decompression then performed  undercutting the bodies of C3 and C4.  Decompression then proceeded to each neural foramina.  Wide anterior foraminotomies were performed along the course exiting C4 nerve roots bilaterally.  At this point a very thorough decompression been achieved.  Wound was then irrigated.  [employer] anatomic peek cage was then packed with locally harvested autograft.  This then impacted in place and recessed slightly from the anterior cortical margin.  27 mm Atlantis anterior cervical plate was then placed over the C3 and C4 levels.  This then attached under fluoroscopic guidance using 13 mm variable angle screws to each of both levels.  All 4 screws given final tightening found to be solidly within the bone.  Locking screws were engaged.  Final images reveal good position of the cages and the hardware at the proper upper level with normal alignment of spine.  Wound is then inspected for hemostasis which found to be good.  Wound was then closed in layers with Vicryl sutures.  Steri-Strips and sterile dressing were applied.  No apparent complications.  Patient tolerated the procedure well and he returned to recovery room postop.

## 2020-03-02 NOTE — Progress Notes (Signed)
Orthopedic Tech Progress Note Patient Details:  [name] [birth year] [id number] RN said patient has SOFT COLLAR Patient ID: Johnathan Murray, male   DOB: [birth year], 69 y.o.   MRN: [id number]   [name] 03/02/2020, 3:51 PM

## 2020-03-02 NOTE — Anesthesia Postprocedure Evaluation (Signed)
Anesthesia Post Note  Patient: Johnathan Murray  Procedure(s) Performed: Anterior Cervical Decompression Fusion  - Cervical three-Cervical four (N/A )     Patient location during evaluation: PACU Anesthesia Type: General Level of consciousness: awake Pain management: pain level controlled Vital Signs Assessment: post-procedure vital signs reviewed and stable Respiratory status: spontaneous breathing Cardiovascular status: stable Postop Assessment: no apparent nausea or vomiting Anesthetic complications: no    Last Vitals:  Vitals:   03/02/20 1445 03/02/20 1536  BP: 103/71 106/73  Pulse: 85 83  Resp: 18 18  Temp: 36.6 C 36.7 C  SpO2: 98% 99%    Last Pain:  Vitals:   03/02/20 1735  TempSrc:   PainSc: 3                  Deyanira Fesler

## 2020-03-03 ENCOUNTER — Encounter: Payer: Self-pay | Admitting: *Deleted

## 2020-03-03 DIAGNOSIS — E039 Hypothyroidism, unspecified: Secondary | ICD-10-CM | POA: Diagnosis not present

## 2020-03-03 DIAGNOSIS — Z7901 Long term (current) use of anticoagulants: Secondary | ICD-10-CM | POA: Diagnosis not present

## 2020-03-03 DIAGNOSIS — Z7984 Long term (current) use of oral hypoglycemic drugs: Secondary | ICD-10-CM | POA: Diagnosis not present

## 2020-03-03 DIAGNOSIS — H902 Conductive hearing loss, unspecified: Secondary | ICD-10-CM | POA: Diagnosis not present

## 2020-03-03 DIAGNOSIS — Z951 Presence of aortocoronary bypass graft: Secondary | ICD-10-CM | POA: Diagnosis not present

## 2020-03-03 DIAGNOSIS — E119 Type 2 diabetes mellitus without complications: Secondary | ICD-10-CM | POA: Diagnosis not present

## 2020-03-03 DIAGNOSIS — Z952 Presence of prosthetic heart valve: Secondary | ICD-10-CM | POA: Diagnosis not present

## 2020-03-03 DIAGNOSIS — M4802 Spinal stenosis, cervical region: Secondary | ICD-10-CM | POA: Diagnosis not present

## 2020-03-03 DIAGNOSIS — Z79890 Hormone replacement therapy (postmenopausal): Secondary | ICD-10-CM | POA: Diagnosis not present

## 2020-03-03 DIAGNOSIS — Z79899 Other long term (current) drug therapy: Secondary | ICD-10-CM | POA: Diagnosis not present

## 2020-03-03 DIAGNOSIS — Z981 Arthrodesis status: Secondary | ICD-10-CM | POA: Diagnosis not present

## 2020-03-03 DIAGNOSIS — E785 Hyperlipidemia, unspecified: Secondary | ICD-10-CM | POA: Diagnosis not present

## 2020-03-03 DIAGNOSIS — I2510 Atherosclerotic heart disease of native coronary artery without angina pectoris: Secondary | ICD-10-CM | POA: Diagnosis not present

## 2020-03-03 DIAGNOSIS — I480 Paroxysmal atrial fibrillation: Secondary | ICD-10-CM | POA: Diagnosis not present

## 2020-03-03 DIAGNOSIS — I10 Essential (primary) hypertension: Secondary | ICD-10-CM | POA: Diagnosis not present

## 2020-03-03 DIAGNOSIS — G992 Myelopathy in diseases classified elsewhere: Secondary | ICD-10-CM | POA: Diagnosis not present

## 2020-03-03 LAB — GLUCOSE, CAPILLARY: Glucose-Capillary: 297 mg/dL — ABNORMAL HIGH (ref 70–99)

## 2020-03-03 NOTE — TOC Transition Note (Signed)
Transition of Care [facility]) - CM/SW Discharge Note   Patient Details  Name: Johnathan Murray MRN: [id number] Date of Birth: 1951/07/03  [facility] of [facility]) CM/SW Contact:  Angelita Ingles, RN Phone Number: 03/03/2020, 11:41 AM   Clinical Narrative:  CM met with patient at bedside and explained that PT has recommended home health services. Medicare.gov list given to patient. Patient reports that he doesn't think he needs it but will discuss with MD. Upon follow up patient reports that he has spoken with his MD and the MD verified that he does not need PT at this point and advised patient to follow up in 1 week with surgeon. Patient continues to decline HH PT at this time but will follow up with MD as advised. Bedside nurse made aware.     Final next level of care: Home/Self Care Barriers to Discharge: No Barriers Identified   Patient Goals and CMS Choice   CMS Medicare.gov Compare Post Acute Care list provided to:: Patient    Discharge Placement                       Discharge Plan and Services   Discharge Planning Services: CM Consult                                 Social Determinants of Health (SDOH) Interventions     Readmission Risk Interventions No flowsheet data found.

## 2020-03-03 NOTE — Progress Notes (Signed)
Inpatient Diabetes Program Recommendations  AACE/ADA: New Consensus Statement on Inpatient Glycemic Control (2015)  Target Ranges:  Prepandial:   less than 140 mg/dL      Peak postprandial:   less than 180 mg/dL (1-2 hours)      Critically ill patients:  140 - 180 mg/dL   Lab Results  Component Value Date   GLUCAP 297 (H) 03/03/2020   HGBA1C 6.8 (H) 02/24/2020    Review of Glycemic Control Results for Johnathan Murray, Johnathan Murray (MRN [id number]) as of 03/03/2020 09:13  Ref. Range 03/02/2020 16:11 03/02/2020 20:22 03/03/2020 06:41  Glucose-Capillary Latest Ref Range: 70 - 99 mg/dL 263 (H) 344 (H) 297 (H)   Diabetes history: Type 2 DM Outpatient Diabetes medications: Metformin 500 mg QHS Current orders for Inpatient glycemic control: Metformin 500 mg QHS, Novolog 0-15 units TID, Novolog 0-5 units QHS Decadron 10 mg x 1  Inpatient Diabetes Program Recommendations:   Glucose trends elevated in the setting of steroids.   While inpatient, Consider Levemir 12 units QD and adding Novolog 3 units TID (assuming patient is consuming >50% of meal).   Thanks, Bronson Curb, MSN, RNC-OB Diabetes Coordinator [phone] (8a-5p)

## 2020-03-03 NOTE — Progress Notes (Signed)
Patient is discharged from room 3C09 at this time. Alert and in stable condition. IV site d/c'd and instructions read to patient and spouse with understanding verbalized. Left unit via wheelchair with all belongings at side. 

## 2020-03-03 NOTE — Discharge Summary (Signed)
Physician Discharge Summary  Patient ID: Johnathan Murray MRN: [id number] DOB/AGE: [birth year] 68 y.o.  Admit date: 03/02/2020 Discharge date: 03/03/2020  Admission Diagnoses:  Discharge Diagnoses:  Active Problems:   Cervical myelopathy [facility])   Discharged Condition: good  Hospital Course: Patient admitted to the hospital where underwent uncomplicated [id number] anterior cervical decompression and fusion for treatment of his compressive cervical myelopathy.  Postoperatively he is doing better.  He states he has better strength and sensation in both hands.  He is standing and walking better.  He is having no wound issues.  He is swallowing reasonably well.  He feels ready for discharge home.  He declines home therapy.  Consults:   Significant Diagnostic Studies:   Treatments:   Discharge Exam: Blood pressure 116/73, pulse 99, temperature (!) 97.2 F (36.2 C), resp. rate 17, height 5\' 10"  (1.778 m), weight 86.2 kg, SpO2 100 %. Awake and alert.  Oriented and appropriate.  Cranial nerve function intact.  Motor and sensory function of the extremities reveals mild weakness of grip and intrinsics bilaterally otherwise motor strength intact.  Sensory examination with the distal patchy sensory loss in both upper extremities.  Lower extremity sensation improved.  Wound clean and dry.  Chest and abdomen benign.  Disposition: Discharge disposition: 01-Home or Self Care        Allergies as of 03/03/2020      Reactions   Atorvastatin Other (See Comments)   Urinary retention    Nebivolol Diarrhea      Medication List    TAKE these medications   amiodarone 400 MG tablet Commonly known as: PACERONE Take 1 tablet (400 mg total) by mouth daily. Take 400 mg for 2 weeks then decrease to 200 mg daily. What changed:   how much to take  when to take this  additional instructions   amLODipine 10 MG tablet Commonly known as: NORVASC Take 10 mg by mouth at bedtime.   apixaban 5 MG Tabs  tablet Commonly known as: Eliquis Take 1 tablet (5 mg total) by mouth 2 (two) times daily.   benazepril 40 MG tablet Commonly known as: LOTENSIN Take 40 mg by mouth at bedtime.   CoQ10 200 MG Caps Take 200 mg by mouth daily.   cyclobenzaprine 10 MG tablet Commonly known as: FLEXERIL Take 1 tablet (10 mg total) by mouth at bedtime.   ezetimibe 10 MG tablet Commonly known as: ZETIA Take 1 tablet (10 mg total) by mouth at bedtime.   furosemide 40 MG tablet Commonly known as: LASIX Take 40 mg by mouth daily.   levothyroxine 25 MCG tablet Commonly known as: SYNTHROID Take 25 mcg by mouth daily before breakfast.   metFORMIN 500 MG tablet Commonly known as: GLUCOPHAGE Take 500 mg by mouth at bedtime.   metoprolol tartrate 50 MG tablet Commonly known as: LOPRESSOR Take 1 tablet (50 mg total) by mouth daily as needed. What changed:   when to take this  reasons to take this   nitroGLYCERIN 0.4 MG SL tablet Commonly known as: NITROSTAT Place 1 tablet (0.4 mg total) under the tongue every 5 (five) minutes as needed for chest pain.   vitamin B-12 1000 MCG tablet Commonly known as: CYANOCOBALAMIN Take 1,000 mcg by mouth daily.   vitamin C 500 MG tablet Commonly known as: ASCORBIC ACID Take 500 mg by mouth daily.   zinc gluconate 50 MG tablet Take 50 mg by mouth daily.        Signed: Cooper Render Adrien Shankar 03/03/2020, 10:27  AM

## 2020-03-03 NOTE — Discharge Instructions (Signed)
Wound Care Keep incision area dry.  You may remove outer bandage after 2 days and shower.   If you shower prior cover incision with plastic wrap.  Do not put any creams, lotions, or ointments on incision. Leave steri-strips on neck.  They will fall off by themselves. Activity Walk each and every day, increasing distance each day. No lifting greater than 5 lbs.  Avoid excessive neck motion. No driving for 2 weeks; may ride as a passenger locally. Wear neck brace at all times except when showering. Diet Resume your normal diet.  Return to Work Will be discussed at you follow up appointment. Call Your Doctor If Any of These Occur Redness, drainage, or swelling at the wound.  Temperature greater than 101 degrees. Severe pain not relieved by pain medication. Increased difficulty swallowing.  Incision starts to come apart. Follow Up Appt Call today and ask for Wells Guiles for appointment in 1-2 weeks [phone]) or for problems.  If you have any hardware placed in your spine, you will need an x-ray before your appointment.     RESUME ELIQUIS ON Saturday AM

## 2020-03-03 NOTE — Evaluation (Signed)
Physical Therapy Evaluation Patient Details Name: Johnathan Murray MRN: [id number] DOB: [birth year] Today's Date: 03/03/2020   History of Present Illness  Pt is a 69 y/o male s/p ACDF C3-4. PMHx includes previous ACDF C4-6 (04/2019), lumbar sx (05/2019), CAD, DM, HTN, hx of CABG    Clinical Impression  Patient is s/p above surgery resulting in the deficits listed below (see PT Problem List). Pt very unsteady with ataxic like gait. Pt much improved with rolling walker however pt refusing to use in home. Pt agreeable to take for community ambulation. Pt is a high falls risk and was educated on potential severity of head/neck injury if he falls and hits his head. Patient will benefit from skilled PT to increase their independence and safety with mobility (while adhering to their precautions) to allow discharge to the venue listed below.     Follow Up Recommendations Home health PT;Supervision/Assistance - 24 hour    Equipment Recommendations  Rolling walker with 5" wheels    Recommendations for Other Services       Precautions / Restrictions Precautions Precautions: Cervical;Fall Precaution Booklet Issued: Yes (comment) Precaution Comments: pt reports verbal understanding but doesn't adhere despite verbal cues Required Braces or Orthoses: Cervical Brace Cervical Brace: Soft collar Restrictions Weight Bearing Restrictions: No      Mobility  Bed Mobility Overal bed mobility: Needs Assistance Bed Mobility: Rolling;Sidelying to Sit Rolling: Supervision Sidelying to sit: Min assist       General bed mobility comments: max directional verbal cues, minA for trunk elevation from HOB flat at pt with regular bed at home  Transfers Overall transfer level: Needs assistance Equipment used: None Transfers: Sit to/from Stand Sit to Stand: Min guard         General transfer comment: pt pushed up on his legs, increased time, increased trunk flexion  Ambulation/Gait Ambulation/Gait  assistance: Min assist(minAx2 without AD) Gait Distance (Feet): 200 Feet Assistive device: Rolling walker (2 wheeled);2 person hand held assist Gait Pattern/deviations: Step-through pattern;Decreased stride length;Ataxic;Wide base of support Gait velocity: slow Gait velocity interpretation: <1.31 ft/sec, indicative of household ambulator General Gait Details: pt initially amb without AD, then to bilat HHA and then to RW. Pt with ataxia gait pattern with short step length and height. Pt aware he is unsteady and reports "I cant tell where my feet are going". Pt given RW and pt with noticable improved stability and sequencing of gait. pt acknowledges this as well however reports 'I can't use this in my house. I"ll be fine in a few days." Pt educated on the risk of severe head/neck injury if pt were to fall and hit his head  Stairs Stairs: Yes Stairs assistance: Min assist Stair Management: One rail Right;Step to pattern;Sideways Number of Stairs: 12 General stair comments: max directional verbal cues, despite education on increased safety with step to pattern pt ascending with alternating pattern  Wheelchair Mobility    Modified Rankin (Stroke Patients Only)       Balance Overall balance assessment: Needs assistance Sitting-balance support: Feet supported;No upper extremity supported Sitting balance-Leahy Scale: Fair     Standing balance support: No upper extremity supported Standing balance-Leahy Scale: Fair Standing balance comment: pt able to stand statically with wide base of support however requires UE support when performing dynamic activity                             Pertinent Vitals/Pain Pain Assessment: 0-10 Pain Score: 1  Pain Location: neck Pain Descriptors / Indicators: Sore Pain Intervention(s): Monitored during session    Home Living Family/patient expects to be discharged to:: Private residence Living Arrangements: Spouse/significant other Available  Help at Discharge: Family;Available 24 hours/day Type of Home: House Home Access: Stairs to enter Entrance Stairs-Rails: Can reach both Entrance Stairs-Number of Steps: 7 Home Layout: One level Home Equipment: Grab bars - tub/shower;Shower seat - built in;Cane - single point      Prior Function Level of Independence: Independent         Comments: pt was a truck driver, 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[employer], then called back however pt unable to due to progressive decline in function since september     Hand Dominance   Dominant Hand: Right    Extremity/Trunk Assessment   Upper Extremity Assessment Upper Extremity Assessment: Defer to OT evaluation    Lower Extremity Assessment Lower Extremity Assessment: RLE deficits/detail;LLE deficits/detail RLE Deficits / Details: MMT is 4+/5 however noted ataxia with amb, impaired proprioception RLE Sensation: decreased proprioception RLE Coordination: decreased gross motor LLE Deficits / Details: MMT grossly 4/5 however noted ataxia, impaired co-ordination and proprioception LLE Sensation: decreased proprioception LLE Coordination: decreased gross motor    Cervical / Trunk Assessment Cervical / Trunk Assessment: Other exceptions Cervical / Trunk Exceptions: neck surgery  Communication   Communication: No difficulties  Cognition Arousal/Alertness: Awake/alert Behavior During Therapy: Impulsive Overall Cognitive Status: Impaired/Different from baseline Area of Impairment: Safety/judgement                         Safety/Judgement: Decreased awareness of safety;Decreased awareness of deficits     General Comments: pt aware of impaired gait but doesn't comprehend his risk of falling and what could happen to him, pt reports feeling support and more steady from RW but is refusing to use it in his home      General Comments General comments (skin integrity, edema, etc.): incision unobserved    Exercises     Assessment/Plan     PT Assessment Patient needs continued PT services  PT Problem List Decreased strength;Decreased range of motion;Decreased activity tolerance;Decreased balance;Decreased mobility;Decreased coordination;Decreased knowledge of use of DME;Decreased safety awareness;Decreased knowledge of precautions       PT Treatment Interventions DME instruction;Gait training;Stair training;Therapeutic exercise;Therapeutic activities;Functional mobility training;Balance training;Neuromuscular re-education    PT Goals (Current goals can be found in the Care Plan section)  Acute Rehab PT Goals Patient Stated Goal: home PT Goal Formulation: With patient Time For Goal Achievement: 03/17/20 Potential to Achieve Goals: Good    Frequency Min 5X/week   Barriers to discharge        Co-evaluation               AM-PAC PT "6 Clicks" Mobility  Outcome Measure Help needed turning from your back to your side while in a flat bed without using bedrails?: A Little Help needed moving from lying on your back to sitting on the side of a flat bed without using bedrails?: A Little Help needed moving to and from a bed to a chair (including a wheelchair)?: A Little Help needed standing up from a chair using your arms (e.g., wheelchair or bedside chair)?: A Little Help needed to walk in hospital room?: A Little Help needed climbing 3-5 steps with a railing? : A Little 6 Click Score: 18    End of Session Equipment Utilized During Treatment: Gait belt;Cervical collar Activity Tolerance: Patient tolerated treatment well Patient  left: in chair;with call bell/phone within reach(with OT) Nurse Communication: Mobility status PT Visit Diagnosis: Muscle weakness (generalized) (M62.81);Difficulty in walking, not elsewhere classified (R26.2)    Time: 0800-0820 PT Time Calculation (min) (ACUTE ONLY): 20 min   Charges:   PT Evaluation $PT Eval Moderate Complexity: 1 Mod          Kittie Plater, PT, DPT Acute  Rehabilitation Services Pager #: [phone] Office #: [phone]   Berline Lopes 03/03/2020, 9:46 AM

## 2020-03-03 NOTE — Evaluation (Signed)
Occupational Therapy Evaluation Patient Details Name: Johnathan Murray MRN: [id number] DOB: 05-Nov-1951 Today's Date: 03/03/2020    History of Present Illness Pt is a 69 y/o male s/p ACDF C3-4. PMHx includes previous ACDF C4-6 (04/2019), lumbar sx (05/2019), CAD, DM, HTN, hx of CABG   Clinical Impression   This 69 y/o male presents with the above. PTA pt reports independence with ADL and functional mobility, though endorses increased difficulty with mobility tasks as of recent. Pt completing functional mobility with and without RW today with overall minA. Pt with improved stability using RW however declines need for use in his home despite continued education on safety/benefits of using DME. Pt currently overall at minA level for ADL tasks including toileting, standing grooming, LB and UB ADL. Educated pt re: cervical precautions, safety and compensatory techniques for completing ADL and mobility tasks, pt verbalizing understanding but requiring intermittent cueing throughout session for carryover. Pt reports plans to return home with spouse who will be home for the first week (typically works during the day). He will benefit from continued OT services while he remains in acute setting and recommend follow up [facility] services after discharge to maximize his safety and independence with ADL and mobility. Will follow.     Follow Up Recommendations  Home health OT;Supervision/Assistance - 24 hour    Equipment Recommendations  None recommended by OT(pt declines need for 3:1 )            Precautions / Restrictions Precautions Precautions: Cervical;Fall Precaution Booklet Issued: Yes (comment) Precaution Comments: pt reports verbal understanding but doesn't adhere despite verbal cues Required Braces or Orthoses: Cervical Brace Cervical Brace: Soft collar Restrictions Weight Bearing Restrictions: No      Mobility Bed Mobility Overal bed mobility: Needs Assistance Bed Mobility:  Rolling;Sidelying to Sit Rolling: Supervision Sidelying to sit: Min assist       General bed mobility comments: max directional verbal cues, minA for trunk elevation from HOB flat at pt with regular bed at home  Transfers Overall transfer level: Needs assistance Equipment used: None Transfers: Sit to/from Stand Sit to Stand: Min guard         General transfer comment: pt pushed up on his legs, increased time, increased trunk flexion    Balance Overall balance assessment: Needs assistance Sitting-balance support: Feet supported;No upper extremity supported Sitting balance-Leahy Scale: Fair     Standing balance support: No upper extremity supported Standing balance-Leahy Scale: Fair Standing balance comment: pt able to stand statically with wide base of support however requires UE support when performing dynamic activity                           ADL either performed or assessed with clinical judgement   ADL Overall ADL's : Needs assistance/impaired Eating/Feeding: Modified independent   Grooming: Wash/dry hands;Standing;Min guard   Upper Body Bathing: Set up;Supervision/ safety;Sitting   Lower Body Bathing: Min guard;Sit to/from stand   Upper Body Dressing : Sitting;Minimal assistance Upper Body Dressing Details (indicate cue type and reason): assist only to achieve getting shirt over soft collar, cues for maintaining cervical precautions Lower Body Dressing: Min guard;Sit to/from stand;Cueing for compensatory techniques   Toilet Transfer: Min guard;Minimal assistance;Ambulation;RW Toilet Transfer Details (indicate cue type and reason): with and without RW Toileting- Clothing Manipulation and Hygiene: Min guard;Minimal assistance;Sit to/from stand Toileting - Clothing Manipulation Details (indicate cue type and reason): pt standing to void bladder   Tub/Shower Transfer Details (indicate cue  type and reason): educted to use built in shower seat for increased  safety and for increased ease of reaching LEs while maintaining cervical precautions Functional mobility during ADLs: Minimal assistance;+2 for safety/equipment;Rolling walker General ADL Comments: pt with unsteadiness with mobility tasks, decreased insight into current deficits, requires cues for carryover of precautions this session     Vision         Perception     Praxis      Pertinent Vitals/Pain Pain Assessment: 0-10 Pain Score: 1  Pain Location: neck Pain Descriptors / Indicators: Sore Pain Intervention(s): Monitored during session     Hand Dominance Right   Extremity/Trunk Assessment Upper Extremity Assessment Upper Extremity Assessment: RUE deficits/detail;Generalized weakness RUE Deficits / Details: pt endorses RUE weakness > LUE; grossly 4/5 throughout today   Lower Extremity Assessment RLE Deficits / Details: MMT is 4+/5 however noted ataxia with amb, impaired proprioception RLE Sensation: decreased proprioception RLE Coordination: decreased gross motor LLE Deficits / Details: MMT grossly 4/5 however noted ataxia, impaired co-ordination and proprioception LLE Sensation: decreased proprioception LLE Coordination: decreased gross motor   Cervical / Trunk Assessment Cervical / Trunk Assessment: Other exceptions Cervical / Trunk Exceptions: neck surgery   Communication Communication Communication: No difficulties   Cognition Arousal/Alertness: Awake/alert Behavior During Therapy: Impulsive Overall Cognitive Status: Impaired/Different from baseline Area of Impairment: Safety/judgement                         Safety/Judgement: Decreased awareness of safety;Decreased awareness of deficits     General Comments: pt aware of impaired gait but doesn't comprehend his risk of falling and what could happen to him, pt reports feeling support and more steady from RW but is refusing to use it in his home   General Comments       Exercises     Shoulder  Instructions      Home Living Family/patient expects to be discharged to:: Private residence Living Arrangements: Spouse/significant other Available Help at Discharge: Family;Available 24 hours/day Type of Home: House Home Access: Stairs to enter [employer] of Steps: 7 Entrance Stairs-Rails: Can reach both Home Layout: One level     Bathroom Shower/Tub: Occupational psychologist: Standard     Home Equipment: Grab bars - tub/shower;Shower seat - built in;Cane - single point          Prior Functioning/Environment Level of Independence: Independent        Comments: pt was a truck driver, 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[place], then called back however pt unable to due to progressive decline in function since september        OT Problem List: Decreased strength;Decreased range of motion;Decreased activity tolerance;Impaired balance (sitting and/or standing);Decreased coordination;Decreased cognition;Decreased safety awareness;Decreased knowledge of use of DME or AE;Decreased knowledge of precautions;Impaired UE functional use      OT Treatment/Interventions: Self-care/ADL training;Therapeutic exercise;Energy conservation;DME and/or AE instruction;Therapeutic activities;Cognitive remediation/compensation;Patient/family education;Balance training;Neuromuscular education    OT Goals(Current goals can be found in the care plan section) Acute Rehab OT Goals Patient Stated Goal: home OT Goal Formulation: With patient Time For Goal Achievement: 03/17/20 Potential to Achieve Goals: Good  OT Frequency: Min 2X/week   Barriers to D/C:            Co-evaluation              AM-PAC OT "6 Clicks" Daily Activity     Outcome Measure Help from another person eating meals?: None Help  from another person taking care of personal grooming?: A Little Help from another person toileting, which includes using toliet, bedpan, or urinal?: A Little Help from another person bathing  (including washing, rinsing, drying)?: A Little Help from another person to put on and taking off regular upper body clothing?: A Little Help from another person to put on and taking off regular lower body clothing?: A Little 6 Click Score: 19   End of Session Equipment Utilized During Treatment: Rolling walker;Cervical collar;Gait belt Nurse Communication: Mobility status  Activity Tolerance: Patient tolerated treatment well Patient left: in chair;with call bell/phone within reach  OT Visit Diagnosis: Unsteadiness on feet (R26.81);Muscle weakness (generalized) (M62.81);Other abnormalities of gait and mobility (R26.89)                Time: [id number] OT Time Calculation (min): 17 min Charges:  OT General Charges $OT Visit: 1 Visit OT Evaluation $OT Eval Moderate Complexity: [place], OT Acute Rehabilitation Services Pager [phone] Office [phone]   Raymondo Band 03/03/2020, 1:10 PM

## 2020-03-14 DIAGNOSIS — E1129 Type 2 diabetes mellitus with other diabetic kidney complication: Secondary | ICD-10-CM | POA: Diagnosis not present

## 2020-03-14 DIAGNOSIS — I4891 Unspecified atrial fibrillation: Secondary | ICD-10-CM | POA: Diagnosis not present

## 2020-03-14 DIAGNOSIS — D509 Iron deficiency anemia, unspecified: Secondary | ICD-10-CM | POA: Diagnosis not present

## 2020-03-14 DIAGNOSIS — E291 Testicular hypofunction: Secondary | ICD-10-CM | POA: Diagnosis not present

## 2020-03-14 DIAGNOSIS — I10 Essential (primary) hypertension: Secondary | ICD-10-CM | POA: Diagnosis not present

## 2020-03-14 DIAGNOSIS — E785 Hyperlipidemia, unspecified: Secondary | ICD-10-CM | POA: Diagnosis not present

## 2020-03-19 ENCOUNTER — Emergency Department (HOSPITAL_COMMUNITY): Payer: BC Managed Care – PPO

## 2020-03-19 ENCOUNTER — Other Ambulatory Visit: Payer: Self-pay

## 2020-03-19 ENCOUNTER — Inpatient Hospital Stay (HOSPITAL_COMMUNITY)
Admission: EM | Admit: 2020-03-19 | Discharge: 2020-04-07 | DRG: 853 | Disposition: A | Discharge status: Rehabilitation Facility | Payer: BC Managed Care – PPO | Attending: Internal Medicine | Admitting: Internal Medicine

## 2020-03-19 ENCOUNTER — Encounter (HOSPITAL_COMMUNITY): Payer: Self-pay | Admitting: Emergency Medicine

## 2020-03-19 DIAGNOSIS — M4626 Osteomyelitis of vertebra, lumbar region: Secondary | ICD-10-CM | POA: Diagnosis present

## 2020-03-19 DIAGNOSIS — Z981 Arthrodesis status: Secondary | ICD-10-CM

## 2020-03-19 DIAGNOSIS — H9012 Conductive hearing loss, unilateral, left ear, with unrestricted hearing on the contralateral side: Secondary | ICD-10-CM | POA: Diagnosis not present

## 2020-03-19 DIAGNOSIS — R17 Unspecified jaundice: Secondary | ICD-10-CM

## 2020-03-19 DIAGNOSIS — I10 Essential (primary) hypertension: Secondary | ICD-10-CM | POA: Diagnosis not present

## 2020-03-19 DIAGNOSIS — M00862 Arthritis due to other bacteria, left knee: Secondary | ICD-10-CM | POA: Diagnosis present

## 2020-03-19 DIAGNOSIS — Z888 Allergy status to other drugs, medicaments and biological substances status: Secondary | ICD-10-CM

## 2020-03-19 DIAGNOSIS — I4891 Unspecified atrial fibrillation: Secondary | ICD-10-CM | POA: Diagnosis not present

## 2020-03-19 DIAGNOSIS — I482 Chronic atrial fibrillation, unspecified: Secondary | ICD-10-CM | POA: Diagnosis not present

## 2020-03-19 DIAGNOSIS — E440 Moderate protein-calorie malnutrition: Secondary | ICD-10-CM | POA: Insufficient documentation

## 2020-03-19 DIAGNOSIS — Z20822 Contact with and (suspected) exposure to covid-19: Secondary | ICD-10-CM | POA: Diagnosis not present

## 2020-03-19 DIAGNOSIS — E861 Hypovolemia: Secondary | ICD-10-CM | POA: Diagnosis present

## 2020-03-19 DIAGNOSIS — M5126 Other intervertebral disc displacement, lumbar region: Secondary | ICD-10-CM | POA: Diagnosis not present

## 2020-03-19 DIAGNOSIS — I9589 Other hypotension: Secondary | ICD-10-CM | POA: Diagnosis present

## 2020-03-19 DIAGNOSIS — I480 Paroxysmal atrial fibrillation: Secondary | ICD-10-CM | POA: Diagnosis not present

## 2020-03-19 DIAGNOSIS — M4640 Discitis, unspecified, site unspecified: Secondary | ICD-10-CM

## 2020-03-19 DIAGNOSIS — B952 Enterococcus as the cause of diseases classified elsewhere: Secondary | ICD-10-CM | POA: Diagnosis not present

## 2020-03-19 DIAGNOSIS — M23303 Other meniscus derangements, unspecified medial meniscus, right knee: Secondary | ICD-10-CM | POA: Diagnosis not present

## 2020-03-19 DIAGNOSIS — R945 Abnormal results of liver function studies: Secondary | ICD-10-CM | POA: Diagnosis present

## 2020-03-19 DIAGNOSIS — G062 Extradural and subdural abscess, unspecified: Secondary | ICD-10-CM | POA: Diagnosis present

## 2020-03-19 DIAGNOSIS — M5441 Lumbago with sciatica, right side: Secondary | ICD-10-CM | POA: Diagnosis not present

## 2020-03-19 DIAGNOSIS — Z79890 Hormone replacement therapy (postmenopausal): Secondary | ICD-10-CM

## 2020-03-19 DIAGNOSIS — H3530 Unspecified macular degeneration: Secondary | ICD-10-CM | POA: Diagnosis present

## 2020-03-19 DIAGNOSIS — E1165 Type 2 diabetes mellitus with hyperglycemia: Secondary | ICD-10-CM | POA: Diagnosis not present

## 2020-03-19 DIAGNOSIS — I330 Acute and subacute infective endocarditis: Secondary | ICD-10-CM | POA: Diagnosis not present

## 2020-03-19 DIAGNOSIS — M009 Pyogenic arthritis, unspecified: Secondary | ICD-10-CM | POA: Diagnosis present

## 2020-03-19 DIAGNOSIS — K5730 Diverticulosis of large intestine without perforation or abscess without bleeding: Secondary | ICD-10-CM | POA: Diagnosis not present

## 2020-03-19 DIAGNOSIS — Z01810 Encounter for preprocedural cardiovascular examination: Secondary | ICD-10-CM | POA: Diagnosis not present

## 2020-03-19 DIAGNOSIS — Z7901 Long term (current) use of anticoagulants: Secondary | ICD-10-CM

## 2020-03-19 DIAGNOSIS — N179 Acute kidney failure, unspecified: Secondary | ICD-10-CM | POA: Diagnosis not present

## 2020-03-19 DIAGNOSIS — R319 Hematuria, unspecified: Secondary | ICD-10-CM | POA: Diagnosis present

## 2020-03-19 DIAGNOSIS — R7881 Bacteremia: Secondary | ICD-10-CM | POA: Diagnosis not present

## 2020-03-19 DIAGNOSIS — I25810 Atherosclerosis of coronary artery bypass graft(s) without angina pectoris: Secondary | ICD-10-CM | POA: Diagnosis not present

## 2020-03-19 DIAGNOSIS — Z955 Presence of coronary angioplasty implant and graft: Secondary | ICD-10-CM

## 2020-03-19 DIAGNOSIS — A4181 Sepsis due to Enterococcus: Secondary | ICD-10-CM | POA: Diagnosis not present

## 2020-03-19 DIAGNOSIS — M11262 Other chondrocalcinosis, left knee: Secondary | ICD-10-CM | POA: Diagnosis present

## 2020-03-19 DIAGNOSIS — M79609 Pain in unspecified limb: Secondary | ICD-10-CM | POA: Diagnosis not present

## 2020-03-19 DIAGNOSIS — Z954 Presence of other heart-valve replacement: Secondary | ICD-10-CM | POA: Diagnosis not present

## 2020-03-19 DIAGNOSIS — E43 Unspecified severe protein-calorie malnutrition: Secondary | ICD-10-CM | POA: Diagnosis not present

## 2020-03-19 DIAGNOSIS — G959 Disease of spinal cord, unspecified: Secondary | ICD-10-CM | POA: Diagnosis not present

## 2020-03-19 DIAGNOSIS — M5442 Lumbago with sciatica, left side: Secondary | ICD-10-CM | POA: Diagnosis not present

## 2020-03-19 DIAGNOSIS — M25462 Effusion, left knee: Secondary | ICD-10-CM | POA: Diagnosis not present

## 2020-03-19 DIAGNOSIS — R609 Edema, unspecified: Secondary | ICD-10-CM | POA: Diagnosis not present

## 2020-03-19 DIAGNOSIS — M00861 Arthritis due to other bacteria, right knee: Secondary | ICD-10-CM | POA: Diagnosis not present

## 2020-03-19 DIAGNOSIS — I361 Nonrheumatic tricuspid (valve) insufficiency: Secondary | ICD-10-CM | POA: Diagnosis not present

## 2020-03-19 DIAGNOSIS — N1831 Chronic kidney disease, stage 3a: Secondary | ICD-10-CM | POA: Diagnosis present

## 2020-03-19 DIAGNOSIS — R7989 Other specified abnormal findings of blood chemistry: Secondary | ICD-10-CM | POA: Diagnosis present

## 2020-03-19 DIAGNOSIS — Z6827 Body mass index (BMI) 27.0-27.9, adult: Secondary | ICD-10-CM

## 2020-03-19 DIAGNOSIS — D638 Anemia in other chronic diseases classified elsewhere: Secondary | ICD-10-CM | POA: Diagnosis present

## 2020-03-19 DIAGNOSIS — E785 Hyperlipidemia, unspecified: Secondary | ICD-10-CM | POA: Diagnosis present

## 2020-03-19 DIAGNOSIS — S83282A Other tear of lateral meniscus, current injury, left knee, initial encounter: Secondary | ICD-10-CM | POA: Diagnosis not present

## 2020-03-19 DIAGNOSIS — M00261 Other streptococcal arthritis, right knee: Secondary | ICD-10-CM | POA: Diagnosis not present

## 2020-03-19 DIAGNOSIS — I4819 Other persistent atrial fibrillation: Secondary | ICD-10-CM | POA: Diagnosis not present

## 2020-03-19 DIAGNOSIS — K760 Fatty (change of) liver, not elsewhere classified: Secondary | ICD-10-CM | POA: Diagnosis not present

## 2020-03-19 DIAGNOSIS — I4892 Unspecified atrial flutter: Secondary | ICD-10-CM | POA: Diagnosis not present

## 2020-03-19 DIAGNOSIS — D62 Acute posthemorrhagic anemia: Secondary | ICD-10-CM | POA: Diagnosis not present

## 2020-03-19 DIAGNOSIS — M660 Rupture of popliteal cyst: Secondary | ICD-10-CM | POA: Diagnosis present

## 2020-03-19 DIAGNOSIS — I483 Typical atrial flutter: Secondary | ICD-10-CM | POA: Diagnosis not present

## 2020-03-19 DIAGNOSIS — Z8249 Family history of ischemic heart disease and other diseases of the circulatory system: Secondary | ICD-10-CM

## 2020-03-19 DIAGNOSIS — E039 Hypothyroidism, unspecified: Secondary | ICD-10-CM | POA: Diagnosis present

## 2020-03-19 DIAGNOSIS — E1169 Type 2 diabetes mellitus with other specified complication: Secondary | ICD-10-CM | POA: Diagnosis not present

## 2020-03-19 DIAGNOSIS — I2510 Atherosclerotic heart disease of native coronary artery without angina pectoris: Secondary | ICD-10-CM | POA: Diagnosis present

## 2020-03-19 DIAGNOSIS — S83242A Other tear of medial meniscus, current injury, left knee, initial encounter: Secondary | ICD-10-CM | POA: Diagnosis not present

## 2020-03-19 DIAGNOSIS — I340 Nonrheumatic mitral (valve) insufficiency: Secondary | ICD-10-CM | POA: Diagnosis not present

## 2020-03-19 DIAGNOSIS — Z85828 Personal history of other malignant neoplasm of skin: Secondary | ICD-10-CM

## 2020-03-19 DIAGNOSIS — E1151 Type 2 diabetes mellitus with diabetic peripheral angiopathy without gangrene: Secondary | ICD-10-CM | POA: Diagnosis not present

## 2020-03-19 DIAGNOSIS — Z03818 Encounter for observation for suspected exposure to other biological agents ruled out: Secondary | ICD-10-CM | POA: Diagnosis not present

## 2020-03-19 DIAGNOSIS — E11649 Type 2 diabetes mellitus with hypoglycemia without coma: Secondary | ICD-10-CM | POA: Diagnosis not present

## 2020-03-19 DIAGNOSIS — Z951 Presence of aortocoronary bypass graft: Secondary | ICD-10-CM

## 2020-03-19 DIAGNOSIS — M25562 Pain in left knee: Secondary | ICD-10-CM

## 2020-03-19 DIAGNOSIS — R778 Other specified abnormalities of plasma proteins: Secondary | ICD-10-CM | POA: Diagnosis not present

## 2020-03-19 DIAGNOSIS — R5381 Other malaise: Secondary | ICD-10-CM | POA: Diagnosis not present

## 2020-03-19 DIAGNOSIS — I959 Hypotension, unspecified: Secondary | ICD-10-CM | POA: Diagnosis not present

## 2020-03-19 DIAGNOSIS — Z9114 Patient's other noncompliance with medication regimen: Secondary | ICD-10-CM

## 2020-03-19 DIAGNOSIS — I129 Hypertensive chronic kidney disease with stage 1 through stage 4 chronic kidney disease, or unspecified chronic kidney disease: Secondary | ICD-10-CM | POA: Diagnosis not present

## 2020-03-19 DIAGNOSIS — N171 Acute kidney failure with acute cortical necrosis: Secondary | ICD-10-CM | POA: Diagnosis not present

## 2020-03-19 DIAGNOSIS — Z7984 Long term (current) use of oral hypoglycemic drugs: Secondary | ICD-10-CM

## 2020-03-19 DIAGNOSIS — M4647 Discitis, unspecified, lumbosacral region: Secondary | ICD-10-CM | POA: Diagnosis not present

## 2020-03-19 DIAGNOSIS — I339 Acute and subacute endocarditis, unspecified: Secondary | ICD-10-CM | POA: Diagnosis not present

## 2020-03-19 DIAGNOSIS — M25461 Effusion, right knee: Secondary | ICD-10-CM | POA: Diagnosis present

## 2020-03-19 DIAGNOSIS — Z952 Presence of prosthetic heart valve: Secondary | ICD-10-CM

## 2020-03-19 DIAGNOSIS — M48061 Spinal stenosis, lumbar region without neurogenic claudication: Secondary | ICD-10-CM | POA: Diagnosis not present

## 2020-03-19 DIAGNOSIS — G061 Intraspinal abscess and granuloma: Secondary | ICD-10-CM | POA: Diagnosis not present

## 2020-03-19 DIAGNOSIS — E1122 Type 2 diabetes mellitus with diabetic chronic kidney disease: Secondary | ICD-10-CM | POA: Diagnosis not present

## 2020-03-19 DIAGNOSIS — M00262 Other streptococcal arthritis, left knee: Secondary | ICD-10-CM | POA: Diagnosis not present

## 2020-03-19 DIAGNOSIS — E872 Acidosis: Secondary | ICD-10-CM | POA: Diagnosis present

## 2020-03-19 DIAGNOSIS — M4646 Discitis, unspecified, lumbar region: Secondary | ICD-10-CM | POA: Diagnosis not present

## 2020-03-19 DIAGNOSIS — S83241A Other tear of medial meniscus, current injury, right knee, initial encounter: Secondary | ICD-10-CM | POA: Diagnosis not present

## 2020-03-19 DIAGNOSIS — E1129 Type 2 diabetes mellitus with other diabetic kidney complication: Secondary | ICD-10-CM | POA: Diagnosis not present

## 2020-03-19 DIAGNOSIS — T826XXA Infection and inflammatory reaction due to cardiac valve prosthesis, initial encounter: Secondary | ICD-10-CM | POA: Diagnosis not present

## 2020-03-19 DIAGNOSIS — M11261 Other chondrocalcinosis, right knee: Secondary | ICD-10-CM | POA: Diagnosis not present

## 2020-03-19 DIAGNOSIS — S83281A Other tear of lateral meniscus, current injury, right knee, initial encounter: Secondary | ICD-10-CM | POA: Diagnosis not present

## 2020-03-19 DIAGNOSIS — M542 Cervicalgia: Secondary | ICD-10-CM | POA: Diagnosis not present

## 2020-03-19 DIAGNOSIS — I38 Endocarditis, valve unspecified: Secondary | ICD-10-CM | POA: Diagnosis not present

## 2020-03-19 DIAGNOSIS — M23301 Other meniscus derangements, unspecified lateral meniscus, left knee: Secondary | ICD-10-CM | POA: Diagnosis not present

## 2020-03-19 DIAGNOSIS — M23300 Other meniscus derangements, unspecified lateral meniscus, right knee: Secondary | ICD-10-CM | POA: Diagnosis not present

## 2020-03-19 DIAGNOSIS — I350 Nonrheumatic aortic (valve) stenosis: Secondary | ICD-10-CM | POA: Diagnosis not present

## 2020-03-19 DIAGNOSIS — N183 Chronic kidney disease, stage 3 unspecified: Secondary | ICD-10-CM | POA: Diagnosis not present

## 2020-03-19 DIAGNOSIS — Z79899 Other long term (current) drug therapy: Secondary | ICD-10-CM

## 2020-03-19 DIAGNOSIS — N170 Acute kidney failure with tubular necrosis: Secondary | ICD-10-CM | POA: Diagnosis not present

## 2020-03-19 LAB — RESPIRATORY PANEL BY RT PCR (FLU A&B, COVID)
Influenza A by PCR: NEGATIVE
Influenza B by PCR: NEGATIVE
SARS Coronavirus 2 by RT PCR: NEGATIVE

## 2020-03-19 LAB — URINALYSIS, ROUTINE W REFLEX MICROSCOPIC
Glucose, UA: 50 mg/dL — AB
Ketones, ur: NEGATIVE mg/dL
Leukocytes,Ua: NEGATIVE
Nitrite: NEGATIVE
Protein, ur: 100 mg/dL — AB
Specific Gravity, Urine: 1.019 (ref 1.005–1.030)
pH: 5 (ref 5.0–8.0)

## 2020-03-19 LAB — CBC WITH DIFFERENTIAL/PLATELET
Abs Immature Granulocytes: 0.24 10*3/uL — ABNORMAL HIGH (ref 0.00–0.07)
Basophils Absolute: 0 10*3/uL (ref 0.0–0.1)
Basophils Relative: 0 %
Eosinophils Absolute: 0 10*3/uL (ref 0.0–0.5)
Eosinophils Relative: 0 %
HCT: 31.2 % — ABNORMAL LOW (ref 39.0–52.0)
Hemoglobin: 10.4 g/dL — ABNORMAL LOW (ref 13.0–17.0)
Immature Granulocytes: 2 %
Lymphocytes Relative: 8 %
Lymphs Abs: 1 10*3/uL (ref 0.7–4.0)
MCH: 29.9 pg (ref 26.0–34.0)
MCHC: 33.3 g/dL (ref 30.0–36.0)
MCV: 89.7 fL (ref 80.0–100.0)
Monocytes Absolute: 1 10*3/uL (ref 0.1–1.0)
Monocytes Relative: 8 %
Neutro Abs: 10.3 10*3/uL — ABNORMAL HIGH (ref 1.7–7.7)
Neutrophils Relative %: 82 %
Platelets: 341 10*3/uL (ref 150–400)
RBC: 3.48 MIL/uL — ABNORMAL LOW (ref 4.22–5.81)
RDW: 14.6 % (ref 11.5–15.5)
WBC: 12.6 10*3/uL — ABNORMAL HIGH (ref 4.0–10.5)
nRBC: 0 % (ref 0.0–0.2)

## 2020-03-19 LAB — COMPREHENSIVE METABOLIC PANEL
ALT: 429 U/L — ABNORMAL HIGH (ref 0–44)
AST: 415 U/L — ABNORMAL HIGH (ref 15–41)
Albumin: 2.6 g/dL — ABNORMAL LOW (ref 3.5–5.0)
Alkaline Phosphatase: 158 U/L — ABNORMAL HIGH (ref 38–126)
Anion gap: 17 — ABNORMAL HIGH (ref 5–15)
BUN: 98 mg/dL — ABNORMAL HIGH (ref 8–23)
CO2: 19 mmol/L — ABNORMAL LOW (ref 22–32)
Calcium: 10.2 mg/dL (ref 8.9–10.3)
Chloride: 94 mmol/L — ABNORMAL LOW (ref 98–111)
Creatinine, Ser: 6.4 mg/dL — ABNORMAL HIGH (ref 0.61–1.24)
GFR calc Af Amer: 10 mL/min — ABNORMAL LOW (ref >60)
GFR calc non Af Amer: 8 mL/min — ABNORMAL LOW (ref >60)
Glucose, Bld: 134 mg/dL — ABNORMAL HIGH (ref 70–99)
Potassium: 4.1 mmol/L (ref 3.5–5.1)
Sodium: 130 mmol/L — ABNORMAL LOW (ref 135–145)
Total Bilirubin: 5.5 mg/dL — ABNORMAL HIGH (ref 0.3–1.2)
Total Protein: 7.8 g/dL (ref 6.5–8.1)

## 2020-03-19 LAB — LACTIC ACID, PLASMA: Lactic Acid, Venous: 1.6 mmol/L (ref 0.5–1.9)

## 2020-03-19 MED ORDER — OXYCODONE HCL 5 MG PO TABS
5.0000 mg | ORAL_TABLET | Freq: Once | ORAL | Status: AC
  Administered 2020-03-19: 23:00:00 5 mg via ORAL
  Filled 2020-03-19: qty 1

## 2020-03-19 MED ORDER — SODIUM CHLORIDE 0.9% FLUSH: 3.0000 mL | Freq: Once | INTRAVENOUS | Status: DC

## 2020-03-19 NOTE — ED Triage Notes (Signed)
Pt had cervical surgery on 4/12.  C/o lower back pain and neck pain.  Reports decreased appetite x 1 week.  Reports intermittent fever at home.  States he saw [name] for follow-up appt 1 week ago and has had pain since then.

## 2020-03-20 ENCOUNTER — Encounter (HOSPITAL_COMMUNITY): Payer: Self-pay | Admitting: Internal Medicine

## 2020-03-20 ENCOUNTER — Inpatient Hospital Stay (HOSPITAL_COMMUNITY): Payer: BC Managed Care – PPO

## 2020-03-20 ENCOUNTER — Emergency Department (HOSPITAL_COMMUNITY): Payer: BC Managed Care – PPO

## 2020-03-20 DIAGNOSIS — I2510 Atherosclerotic heart disease of native coronary artery without angina pectoris: Secondary | ICD-10-CM | POA: Diagnosis present

## 2020-03-20 DIAGNOSIS — I4819 Other persistent atrial fibrillation: Secondary | ICD-10-CM | POA: Diagnosis not present

## 2020-03-20 DIAGNOSIS — R7881 Bacteremia: Secondary | ICD-10-CM | POA: Diagnosis not present

## 2020-03-20 DIAGNOSIS — I482 Chronic atrial fibrillation, unspecified: Secondary | ICD-10-CM | POA: Diagnosis not present

## 2020-03-20 DIAGNOSIS — N179 Acute kidney failure, unspecified: Secondary | ICD-10-CM | POA: Diagnosis not present

## 2020-03-20 DIAGNOSIS — A4181 Sepsis due to Enterococcus: Secondary | ICD-10-CM | POA: Diagnosis present

## 2020-03-20 DIAGNOSIS — M00861 Arthritis due to other bacteria, right knee: Secondary | ICD-10-CM | POA: Diagnosis not present

## 2020-03-20 DIAGNOSIS — H9012 Conductive hearing loss, unilateral, left ear, with unrestricted hearing on the contralateral side: Secondary | ICD-10-CM | POA: Diagnosis present

## 2020-03-20 DIAGNOSIS — R609 Edema, unspecified: Secondary | ICD-10-CM

## 2020-03-20 DIAGNOSIS — M79609 Pain in unspecified limb: Secondary | ICD-10-CM

## 2020-03-20 DIAGNOSIS — R945 Abnormal results of liver function studies: Secondary | ICD-10-CM

## 2020-03-20 DIAGNOSIS — I25810 Atherosclerosis of coronary artery bypass graft(s) without angina pectoris: Secondary | ICD-10-CM | POA: Diagnosis not present

## 2020-03-20 DIAGNOSIS — E039 Hypothyroidism, unspecified: Secondary | ICD-10-CM | POA: Diagnosis present

## 2020-03-20 DIAGNOSIS — N171 Acute kidney failure with acute cortical necrosis: Secondary | ICD-10-CM | POA: Diagnosis not present

## 2020-03-20 DIAGNOSIS — I129 Hypertensive chronic kidney disease with stage 1 through stage 4 chronic kidney disease, or unspecified chronic kidney disease: Secondary | ICD-10-CM | POA: Diagnosis present

## 2020-03-20 DIAGNOSIS — I330 Acute and subacute infective endocarditis: Secondary | ICD-10-CM | POA: Diagnosis not present

## 2020-03-20 DIAGNOSIS — E1122 Type 2 diabetes mellitus with diabetic chronic kidney disease: Secondary | ICD-10-CM | POA: Diagnosis present

## 2020-03-20 DIAGNOSIS — N170 Acute kidney failure with tubular necrosis: Secondary | ICD-10-CM | POA: Diagnosis not present

## 2020-03-20 DIAGNOSIS — Z20822 Contact with and (suspected) exposure to covid-19: Secondary | ICD-10-CM | POA: Diagnosis present

## 2020-03-20 DIAGNOSIS — I361 Nonrheumatic tricuspid (valve) insufficiency: Secondary | ICD-10-CM | POA: Diagnosis not present

## 2020-03-20 DIAGNOSIS — Z954 Presence of other heart-valve replacement: Secondary | ICD-10-CM | POA: Diagnosis not present

## 2020-03-20 DIAGNOSIS — E43 Unspecified severe protein-calorie malnutrition: Secondary | ICD-10-CM | POA: Diagnosis present

## 2020-03-20 DIAGNOSIS — I340 Nonrheumatic mitral (valve) insufficiency: Secondary | ICD-10-CM | POA: Diagnosis not present

## 2020-03-20 DIAGNOSIS — I10 Essential (primary) hypertension: Secondary | ICD-10-CM

## 2020-03-20 DIAGNOSIS — I4891 Unspecified atrial fibrillation: Secondary | ICD-10-CM | POA: Diagnosis not present

## 2020-03-20 DIAGNOSIS — M4626 Osteomyelitis of vertebra, lumbar region: Secondary | ICD-10-CM | POA: Diagnosis present

## 2020-03-20 DIAGNOSIS — I480 Paroxysmal atrial fibrillation: Secondary | ICD-10-CM

## 2020-03-20 DIAGNOSIS — M00262 Other streptococcal arthritis, left knee: Secondary | ICD-10-CM | POA: Diagnosis not present

## 2020-03-20 DIAGNOSIS — M009 Pyogenic arthritis, unspecified: Secondary | ICD-10-CM | POA: Diagnosis not present

## 2020-03-20 DIAGNOSIS — M00862 Arthritis due to other bacteria, left knee: Secondary | ICD-10-CM | POA: Diagnosis present

## 2020-03-20 DIAGNOSIS — M4647 Discitis, unspecified, lumbosacral region: Secondary | ICD-10-CM | POA: Diagnosis not present

## 2020-03-20 DIAGNOSIS — R5381 Other malaise: Secondary | ICD-10-CM | POA: Diagnosis not present

## 2020-03-20 DIAGNOSIS — I339 Acute and subacute endocarditis, unspecified: Secondary | ICD-10-CM | POA: Diagnosis not present

## 2020-03-20 DIAGNOSIS — I959 Hypotension, unspecified: Secondary | ICD-10-CM

## 2020-03-20 DIAGNOSIS — I4892 Unspecified atrial flutter: Secondary | ICD-10-CM | POA: Diagnosis not present

## 2020-03-20 DIAGNOSIS — D62 Acute posthemorrhagic anemia: Secondary | ICD-10-CM | POA: Diagnosis not present

## 2020-03-20 DIAGNOSIS — E872 Acidosis: Secondary | ICD-10-CM | POA: Diagnosis present

## 2020-03-20 DIAGNOSIS — T826XXA Infection and inflammatory reaction due to cardiac valve prosthesis, initial encounter: Secondary | ICD-10-CM | POA: Diagnosis not present

## 2020-03-20 DIAGNOSIS — E1151 Type 2 diabetes mellitus with diabetic peripheral angiopathy without gangrene: Secondary | ICD-10-CM | POA: Diagnosis present

## 2020-03-20 DIAGNOSIS — N1831 Chronic kidney disease, stage 3a: Secondary | ICD-10-CM | POA: Diagnosis present

## 2020-03-20 DIAGNOSIS — M11262 Other chondrocalcinosis, left knee: Secondary | ICD-10-CM | POA: Diagnosis present

## 2020-03-20 DIAGNOSIS — H3530 Unspecified macular degeneration: Secondary | ICD-10-CM | POA: Diagnosis present

## 2020-03-20 DIAGNOSIS — E11649 Type 2 diabetes mellitus with hypoglycemia without coma: Secondary | ICD-10-CM | POA: Diagnosis not present

## 2020-03-20 DIAGNOSIS — Z01810 Encounter for preprocedural cardiovascular examination: Secondary | ICD-10-CM | POA: Diagnosis not present

## 2020-03-20 DIAGNOSIS — I483 Typical atrial flutter: Secondary | ICD-10-CM | POA: Diagnosis not present

## 2020-03-20 DIAGNOSIS — K760 Fatty (change of) liver, not elsewhere classified: Secondary | ICD-10-CM | POA: Diagnosis not present

## 2020-03-20 DIAGNOSIS — E785 Hyperlipidemia, unspecified: Secondary | ICD-10-CM | POA: Diagnosis present

## 2020-03-20 DIAGNOSIS — M4646 Discitis, unspecified, lumbar region: Secondary | ICD-10-CM | POA: Diagnosis present

## 2020-03-20 DIAGNOSIS — I350 Nonrheumatic aortic (valve) stenosis: Secondary | ICD-10-CM | POA: Diagnosis not present

## 2020-03-20 DIAGNOSIS — E1169 Type 2 diabetes mellitus with other specified complication: Secondary | ICD-10-CM | POA: Diagnosis present

## 2020-03-20 DIAGNOSIS — G959 Disease of spinal cord, unspecified: Secondary | ICD-10-CM | POA: Diagnosis present

## 2020-03-20 DIAGNOSIS — E1165 Type 2 diabetes mellitus with hyperglycemia: Secondary | ICD-10-CM | POA: Diagnosis present

## 2020-03-20 DIAGNOSIS — M00261 Other streptococcal arthritis, right knee: Secondary | ICD-10-CM | POA: Diagnosis not present

## 2020-03-20 DIAGNOSIS — M4640 Discitis, unspecified, site unspecified: Secondary | ICD-10-CM | POA: Diagnosis not present

## 2020-03-20 DIAGNOSIS — B952 Enterococcus as the cause of diseases classified elsewhere: Secondary | ICD-10-CM | POA: Diagnosis not present

## 2020-03-20 DIAGNOSIS — Z951 Presence of aortocoronary bypass graft: Secondary | ICD-10-CM | POA: Diagnosis not present

## 2020-03-20 DIAGNOSIS — I38 Endocarditis, valve unspecified: Secondary | ICD-10-CM | POA: Diagnosis not present

## 2020-03-20 DIAGNOSIS — G062 Extradural and subdural abscess, unspecified: Secondary | ICD-10-CM | POA: Diagnosis present

## 2020-03-20 DIAGNOSIS — Z952 Presence of prosthetic heart valve: Secondary | ICD-10-CM | POA: Diagnosis not present

## 2020-03-20 DIAGNOSIS — E1129 Type 2 diabetes mellitus with other diabetic kidney complication: Secondary | ICD-10-CM | POA: Diagnosis not present

## 2020-03-20 HISTORY — DX: Acute kidney failure, unspecified: N17.9

## 2020-03-20 LAB — VITAMIN B12: Vitamin B-12: 3628 pg/mL — ABNORMAL HIGH (ref 180–914)

## 2020-03-20 LAB — URINALYSIS, COMPLETE (UACMP) WITH MICROSCOPIC
Bilirubin Urine: NEGATIVE
Glucose, UA: NEGATIVE mg/dL
Ketones, ur: NEGATIVE mg/dL
Leukocytes,Ua: NEGATIVE
Nitrite: NEGATIVE
Protein, ur: 30 mg/dL — AB
Specific Gravity, Urine: 1.013 (ref 1.005–1.030)
pH: 5 (ref 5.0–8.0)

## 2020-03-20 LAB — BASIC METABOLIC PANEL
Anion gap: 19 — ABNORMAL HIGH (ref 5–15)
Anion gap: 16 — ABNORMAL HIGH (ref 5–15)
BUN: 106 mg/dL — ABNORMAL HIGH (ref 8–23)
BUN: 94 mg/dL — ABNORMAL HIGH (ref 8–23)
CO2: 16 mmol/L — ABNORMAL LOW (ref 22–32)
CO2: 18 mmol/L — ABNORMAL LOW (ref 22–32)
Calcium: 9.2 mg/dL (ref 8.9–10.3)
Calcium: 9 mg/dL (ref 8.9–10.3)
Chloride: 98 mmol/L (ref 98–111)
Chloride: 99 mmol/L (ref 98–111)
Creatinine, Ser: 6.53 mg/dL — ABNORMAL HIGH (ref 0.61–1.24)
Creatinine, Ser: 5.15 mg/dL — ABNORMAL HIGH (ref 0.61–1.24)
GFR calc Af Amer: 9 mL/min — ABNORMAL LOW (ref >60)
GFR calc Af Amer: 12 mL/min — ABNORMAL LOW (ref >60)
GFR calc non Af Amer: 8 mL/min — ABNORMAL LOW (ref >60)
GFR calc non Af Amer: 11 mL/min — ABNORMAL LOW (ref >60)
Glucose, Bld: 71 mg/dL (ref 70–99)
Glucose, Bld: 72 mg/dL (ref 70–99)
Potassium: 4.2 mmol/L (ref 3.5–5.1)
Potassium: 4.1 mmol/L (ref 3.5–5.1)
Sodium: 133 mmol/L — ABNORMAL LOW (ref 135–145)
Sodium: 133 mmol/L — ABNORMAL LOW (ref 135–145)

## 2020-03-20 LAB — TSH: TSH: 1.264 u[IU]/mL (ref 0.350–4.500)

## 2020-03-20 LAB — TYPE AND SCREEN
ABO/RH(D): A POS
Antibody Screen: NEGATIVE

## 2020-03-20 LAB — HEPATITIS PANEL, ACUTE
HCV Ab: NONREACTIVE
Hep A IgM: NONREACTIVE
Hep B C IgM: NONREACTIVE
Hepatitis B Surface Ag: NONREACTIVE

## 2020-03-20 LAB — URINE CULTURE: Culture: NO GROWTH

## 2020-03-20 LAB — CBC WITH DIFFERENTIAL/PLATELET
Abs Immature Granulocytes: 0.34 10*3/uL — ABNORMAL HIGH (ref 0.00–0.07)
Basophils Absolute: 0 10*3/uL (ref 0.0–0.1)
Basophils Relative: 0 %
Eosinophils Absolute: 0 10*3/uL (ref 0.0–0.5)
Eosinophils Relative: 0 %
HCT: 24.8 % — ABNORMAL LOW (ref 39.0–52.0)
Hemoglobin: 8.5 g/dL — ABNORMAL LOW (ref 13.0–17.0)
Immature Granulocytes: 3 %
Lymphocytes Relative: 9 %
Lymphs Abs: 1.2 10*3/uL (ref 0.7–4.0)
MCH: 29.8 pg (ref 26.0–34.0)
MCHC: 34.3 g/dL (ref 30.0–36.0)
MCV: 87 fL (ref 80.0–100.0)
Monocytes Absolute: 1 10*3/uL (ref 0.1–1.0)
Monocytes Relative: 8 %
Neutro Abs: 10.5 10*3/uL — ABNORMAL HIGH (ref 1.7–7.7)
Neutrophils Relative %: 80 %
Platelets: 304 10*3/uL (ref 150–400)
RBC: 2.85 MIL/uL — ABNORMAL LOW (ref 4.22–5.81)
RDW: 14.5 % (ref 11.5–15.5)
WBC: 13.1 10*3/uL — ABNORMAL HIGH (ref 4.0–10.5)
nRBC: 0 % (ref 0.0–0.2)

## 2020-03-20 LAB — CBG MONITORING, ED
Glucose-Capillary: 58 mg/dL — ABNORMAL LOW (ref 70–99)
Glucose-Capillary: 60 mg/dL — ABNORMAL LOW (ref 70–99)
Glucose-Capillary: 94 mg/dL (ref 70–99)

## 2020-03-20 LAB — PROTEIN / CREATININE RATIO, URINE
Creatinine, Urine: 132.48 mg/dL
Protein Creatinine Ratio: 0.9 mg/mg{Cre} — ABNORMAL HIGH (ref 0.00–0.15)
Total Protein, Urine: 119 mg/dL

## 2020-03-20 LAB — IRON AND TIBC
Iron: 43 ug/dL — ABNORMAL LOW (ref 45–182)
Saturation Ratios: 29 % (ref 17.9–39.5)
TIBC: 150 ug/dL — ABNORMAL LOW (ref 250–450)
UIBC: 107 ug/dL

## 2020-03-20 LAB — HEPATIC FUNCTION PANEL
ALT: 302 U/L — ABNORMAL HIGH (ref 0–44)
AST: 227 U/L — ABNORMAL HIGH (ref 15–41)
Albumin: 2.1 g/dL — ABNORMAL LOW (ref 3.5–5.0)
Alkaline Phosphatase: 127 U/L — ABNORMAL HIGH (ref 38–126)
Bilirubin, Direct: 2.7 mg/dL — ABNORMAL HIGH (ref 0.0–0.2)
Indirect Bilirubin: 2.1 mg/dL — ABNORMAL HIGH (ref 0.3–0.9)
Total Bilirubin: 4.8 mg/dL — ABNORMAL HIGH (ref 0.3–1.2)
Total Protein: 6.5 g/dL (ref 6.5–8.1)

## 2020-03-20 LAB — GLUCOSE, CAPILLARY
Glucose-Capillary: 88 mg/dL (ref 70–99)
Glucose-Capillary: 63 mg/dL — ABNORMAL LOW (ref 70–99)
Glucose-Capillary: 90 mg/dL (ref 70–99)
Glucose-Capillary: 66 mg/dL — ABNORMAL LOW (ref 70–99)
Glucose-Capillary: 79 mg/dL (ref 70–99)

## 2020-03-20 LAB — APTT: aPTT: 55 seconds — ABNORMAL HIGH (ref 24–36)

## 2020-03-20 LAB — FOLATE: Folate: 11.7 ng/mL (ref >5.9)

## 2020-03-20 LAB — CK
Total CK: 20 U/L — ABNORMAL LOW (ref 49–397)
Total CK: 54 U/L (ref 49–397)

## 2020-03-20 LAB — PROCALCITONIN: Procalcitonin: 0.57 ng/mL

## 2020-03-20 LAB — PROTIME-INR
INR: 1.8 — ABNORMAL HIGH (ref 0.8–1.2)
Prothrombin Time: 20 seconds — ABNORMAL HIGH (ref 11.4–15.2)

## 2020-03-20 LAB — HIV ANTIBODY (ROUTINE TESTING W REFLEX): HIV Screen 4th Generation wRfx: NONREACTIVE

## 2020-03-20 LAB — CREATININE, URINE, RANDOM: Creatinine, Urine: 134.76 mg/dL

## 2020-03-20 LAB — SODIUM, URINE, RANDOM: Sodium, Ur: 26 mmol/L

## 2020-03-20 LAB — FERRITIN: Ferritin: 3811 ng/mL — ABNORMAL HIGH (ref 24–336)

## 2020-03-20 LAB — CORTISOL: Cortisol, Plasma: 32.3 ug/dL

## 2020-03-20 LAB — TROPONIN I (HIGH SENSITIVITY): Troponin I (High Sensitivity): 48 ng/L — ABNORMAL HIGH (ref <18)

## 2020-03-20 LAB — HEPARIN LEVEL (UNFRACTIONATED): Heparin Unfractionated: >2.2 IU/mL — ABNORMAL HIGH (ref 0.30–0.70)

## 2020-03-20 LAB — LACTIC ACID, PLASMA: Lactic Acid, Venous: 1 mmol/L (ref 0.5–1.9)

## 2020-03-20 MED ORDER — INSULIN ASPART 100 UNIT/ML ~~LOC~~ SOLN
0.0000 [IU] | Freq: Three times a day (TID) | SUBCUTANEOUS | Status: DC
  Administered 2020-03-22: 1 [IU] via SUBCUTANEOUS
  Administered 2020-03-23: 3 [IU] via SUBCUTANEOUS
  Administered 2020-03-23: 4 [IU] via SUBCUTANEOUS

## 2020-03-20 MED ORDER — SODIUM CHLORIDE 0.9 % IV BOLUS
1000.0000 mL | Freq: Once | INTRAVENOUS | Status: AC
  Administered 2020-03-20: 09:00:00 1000 mL via INTRAVENOUS

## 2020-03-20 MED ORDER — SODIUM CHLORIDE 0.9 % IV SOLN: 1.0000 g | INTRAVENOUS | Status: DC

## 2020-03-20 MED ORDER — SODIUM CHLORIDE 0.9 % IV BOLUS
250.0000 mL | Freq: Once | INTRAVENOUS | Status: AC
  Administered 2020-03-20: 10:00:00 250 mL via INTRAVENOUS

## 2020-03-20 MED ORDER — SODIUM CHLORIDE 0.9 % IV SOLN
1.0000 g | Freq: Once | INTRAVENOUS | Status: AC
  Administered 2020-03-20: 1 g via INTRAVENOUS
  Filled 2020-03-20: qty 1

## 2020-03-20 MED ORDER — LACTATED RINGERS IV BOLUS
1000.0000 mL | Freq: Once | INTRAVENOUS | Status: AC
  Administered 2020-03-20: 1000 mL via INTRAVENOUS

## 2020-03-20 MED ORDER — LEVOTHYROXINE SODIUM 25 MCG PO TABS
25.0000 ug | ORAL_TABLET | Freq: Every day | ORAL | Status: DC
  Administered 2020-03-20 – 2020-04-07 (×17): 25 ug via ORAL
  Filled 2020-03-20 (×17): qty 1

## 2020-03-20 MED ORDER — SODIUM CHLORIDE 0.9 % IV BOLUS
1000.0000 mL | Freq: Once | INTRAVENOUS | Status: AC
  Administered 2020-03-20: 1000 mL via INTRAVENOUS

## 2020-03-20 MED ORDER — AMLODIPINE BESYLATE 5 MG PO TABS: 10.0000 mg | ORAL_TABLET | Freq: Every day | ORAL | Status: DC

## 2020-03-20 MED ORDER — HYDRALAZINE HCL 20 MG/ML IJ SOLN: 10.0000 mg | INTRAMUSCULAR | Status: DC | PRN

## 2020-03-20 MED ORDER — HEPARIN (PORCINE) 25000 UT/250ML-% IV SOLN
1400.0000 [IU]/h | INTRAVENOUS | Status: AC
  Administered 2020-03-20: 1200 [IU]/h via INTRAVENOUS
  Administered 2020-03-20: 1350 [IU]/h via INTRAVENOUS
  Administered 2020-03-21 – 2020-03-23 (×3): 1600 [IU]/h via INTRAVENOUS
  Administered 2020-03-23: 1400 [IU]/h via INTRAVENOUS
  Filled 2020-03-20 (×5): qty 250

## 2020-03-20 MED ORDER — MIDODRINE HCL 5 MG PO TABS
10.0000 mg | ORAL_TABLET | Freq: Three times a day (TID) | ORAL | Status: DC
  Administered 2020-03-20 – 2020-04-03 (×42): 10 mg via ORAL
  Filled 2020-03-20 (×45): qty 2

## 2020-03-20 MED ORDER — FENTANYL CITRATE (PF) 100 MCG/2ML IJ SOLN
12.5000 ug | Freq: Once | INTRAMUSCULAR | Status: AC
  Administered 2020-03-20: 04:00:00 12.5 ug via INTRAVENOUS
  Filled 2020-03-20: qty 2

## 2020-03-20 MED ORDER — LACTATED RINGERS IV SOLN: INTRAVENOUS | Status: AC

## 2020-03-20 MED ORDER — LACTATED RINGERS IV BOLUS: 250.0000 mL | Freq: Once | INTRAVENOUS | Status: DC

## 2020-03-20 MED ORDER — OXYCODONE HCL 5 MG PO TABS
5.0000 mg | ORAL_TABLET | Freq: Four times a day (QID) | ORAL | Status: DC | PRN
  Administered 2020-03-20 – 2020-04-02 (×21): 5 mg via ORAL
  Filled 2020-03-20 (×23): qty 1

## 2020-03-20 NOTE — Progress Notes (Signed)
Pharmacy Antibiotic Note  Johnathan Murray is a 69 y.o. male admitted on 03/19/2020 with concern for sepsis in the setting of recent back surgery and hypotension. Pharmacy has been consulted for Cefepime dosing.  The patient is noted to be in AKI with SCr 6.53 (baseline 1.3-1.5), estimated CrCl<10 ml/min. Will dose Cefepime conservatively.  If gram positive coverage warranted - could utilize Zyvox to avoid further nephrotoxicity with Vancomycin.   Plan: - Cefepime 1g IV x 1 followed by 1g IV every 24 hours - Will continue to follow renal function, culture results, LOT, and antibiotic de-escalation plans   Height: 5\' 10"  (177.8 cm) Weight: 86.2 kg (190 lb 0.6 oz) IBW/kg (Calculated) : 73  Temp (24hrs), Avg:98.3 F (36.8 C), Min:98 F (36.7 C), Max:98.6 F (37 C)  Recent Labs  Lab 03/19/20 1801 03/20/20 0331  WBC 12.6* 13.1*  CREATININE 6.40* 6.53*  LATICACIDVEN 1.6  --     Estimated Creatinine Clearance: 11.3 mL/min (A) (by C-G formula based on SCr of 6.53 mg/dL (H)).    Allergies  Allergen Reactions  . Atorvastatin Other (See Comments)    Urinary retention   . Nebivolol Diarrhea    Antimicrobials this admission: Cefepime 4/30 >>  Dose adjustments this admission: n/a  Microbiology results: 4/29 UCx >> 4/29 Fluvid >> neg 4/30 BCx >>  Thank you for allowing pharmacy to be a part of this patient's care.  Alycia Rossetti, PharmD, BCPS Clinical Pharmacist Clinical phone for 03/20/2020: [phone] 03/20/2020 11:45 AM   **Pharmacist phone directory can now be found on [email or web address] ([email or web address]).  Listed under [place].

## 2020-03-20 NOTE — Progress Notes (Signed)
Lower venous duplex       has been completed. Preliminary results can be found under CV proc through chart review. Jalan Fariss, BS, RDMS, RVT   

## 2020-03-20 NOTE — Progress Notes (Signed)
CRITICAL VALUE ALERT  Critical Value: CBG:63  Date & Time Notied: 03/20/20 1640  Orders Received/Actions taken: 8 oz. Of orange juice given. No hyperglycemic symptoms reported or assessed. CBG re-checked and is now 90. Will continue to monitor.

## 2020-03-20 NOTE — Progress Notes (Signed)
ANTICOAGULATION CONSULT NOTE - Initial Consult  Pharmacy Consult for heparin Indication: atrial fibrillation  Allergies  Allergen Reactions  . Atorvastatin Other (See Comments)    Urinary retention   . Nebivolol Diarrhea    Patient Measurements: Height: 5\' 10"  (177.8 cm) Weight: 86.2 kg (190 lb 0.6 oz) IBW/kg (Calculated) : 73  Vital Signs: Temp: 98.6 F (37 C) (04/29 1752) Temp Source: Oral (04/29 1752) BP: 115/67 (04/30 0254) Pulse Rate: 70 (04/30 0254)  Labs: Recent Labs    03/19/20 1801 03/20/20 0331  HGB 10.4* 8.5*  HCT 31.2* 24.8*  PLT 341 304  LABPROT  --  20.0*  INR  --  1.8*  CREATININE 6.40*  --   CKTOTAL 20*  --     Estimated Creatinine Clearance: 11.6 mL/min (A) (by C-G formula based on SCr of 6.4 mg/dL (H)).   Medical History: Past Medical History:  Diagnosis Date  . Abnormal LFTs (liver function tests) 10/31/2016  . Abnormal nuclear stress test 05/03/2018  . Acid reflux 02/15/2018   not at present  . Acute otitis externa of left ear 11/13/2019  . Acute suppurative otitis media of left ear without spontaneous rupture of tympanic membrane 11/13/2019  . Anemia    low iron  . Aortic stenosis 05/03/2018  . Arthritis 02/15/2018   Overview:  shoulder  . Cancer ([place])    skin cancer on left wrist  . Cervical myelopathy ([place]) 04/22/2019  . Conductive hearing loss of left ear 11/13/2019  . Coronary artery disease involving native coronary artery of native heart without angina pectoris 05/30/2016  . Coronary artery disease of native artery of native heart with stable angina pectoris ([place])    pt. denies  . Diabetes mellitus due to underlying condition with unspecified complications ([place]) [id number]  . Dyslipidemia 05/30/2016  . Dyspnea    upon exertion,  as of 06/05/09 not having any recent chest pain  . Dysrhythmia    a fib  . Essential hypertension 05/30/2016  . Heart murmur    Has had an Aortic valve replacement  . Hyperactive gag reflex 02/15/2018  .  Hypothyroidism   . Intermittent claudication ([place]) 10/31/2018  . Lumbar stenosis with neurogenic claudication 06/11/2019  . Macular degeneration 02/15/2018  . PAF (paroxysmal atrial fibrillation) ([place]) 05/30/2016  . Pre-operative cardiovascular examination 01/03/2020  . Preoperative cardiovascular examination 04/17/2019  . Renal insufficiency 01/17/2020  . S/P aortic valve replacement 01/17/2020  . Tinnitus of left ear 11/13/2019    Assessment: 69yo male admitted for further w/u of AKI and elevated LFTs, to transition from Eliquis to UFH; last dose of Eliquis taken 4/29.  Goal of Therapy:  Heparin level 0.3-0.7 units/ml aPTT 66-102 seconds Monitor platelets by anticoagulation protocol: Yes   Plan:  Will begin heparin gtt at 1200 units/hr and monitor heparin levels, aPTT (while Eliquis affects anti-Xa), and CBC.  Wynona Neat, PharmD, BCPS  03/20/2020,5:45 AM

## 2020-03-20 NOTE — Progress Notes (Signed)
ANTICOAGULATION CONSULT NOTE Follow Up Consult  Pharmacy Consult for Heparin Indication: atrial fibrillation  Allergies  Allergen Reactions  . Atorvastatin Other (See Comments)    Urinary retention   . Nebivolol Diarrhea    Patient Measurements: Height: 5\' 10"  (177.8 cm) Weight: 86.2 kg (190 lb 0.6 oz) IBW/kg (Calculated) : 73  Heparin Dosing Weight: 86.2 kg  Vital Signs: Temp: 98.3 F (36.8 C) (04/30 1636) Temp Source: Oral (04/30 1636) BP: 100/56 (04/30 1636) Pulse Rate: 74 (04/30 1636)  Labs: Recent Labs    03/19/20 1801 03/20/20 0331 03/20/20 1517  HGB 10.4* 8.5*  --   HCT 31.2* 24.8*  --   PLT 341 304  --   APTT  --   --  55*  LABPROT  --  20.0*  --   INR  --  1.8*  --   HEPARINUNFRC  --   --  >2.20*  CREATININE 6.40* 6.53* 5.15*  CKTOTAL 20* 54  --   TROPONINIHS  --  48*  --     Estimated Creatinine Clearance: 14.4 mL/min (A) (by C-G formula based on SCr of 5.15 mg/dL (H)).   Medical History: Past Medical History:  Diagnosis Date  . Abnormal LFTs (liver function tests) 10/31/2016  . Abnormal nuclear stress test 05/03/2018  . Acid reflux 02/15/2018   not at present  . Acute otitis externa of left ear 11/13/2019  . Acute suppurative otitis media of left ear without spontaneous rupture of tympanic membrane 11/13/2019  . Anemia    low iron  . Aortic stenosis 05/03/2018  . Arthritis 02/15/2018   Overview:  shoulder  . Cancer ([place])    skin cancer on left wrist  . Cervical myelopathy ([place]) 04/22/2019  . Conductive hearing loss of left ear 11/13/2019  . Coronary artery disease involving native coronary artery of native heart without angina pectoris 05/30/2016  . Coronary artery disease of native artery of native heart with stable angina pectoris ([place])    pt. denies  . Diabetes mellitus due to underlying condition with unspecified complications ([place]) [id number]  . Dyslipidemia 05/30/2016  . Dyspnea    upon exertion,  as of 06/05/09 not having any recent chest  pain  . Dysrhythmia    a fib  . Essential hypertension 05/30/2016  . Heart murmur    Has had an Aortic valve replacement  . Hyperactive gag reflex 02/15/2018  . Hypothyroidism   . Intermittent claudication ([place]) 10/31/2018  . Lumbar stenosis with neurogenic claudication 06/11/2019  . Macular degeneration 02/15/2018  . PAF (paroxysmal atrial fibrillation) ([place]) 05/30/2016  . Pre-operative cardiovascular examination 01/03/2020  . Preoperative cardiovascular examination 04/17/2019  . Renal insufficiency 01/17/2020  . S/P aortic valve replacement 01/17/2020  . Tinnitus of left ear 11/13/2019    Assessment: 69 yr old male admitted for further workup of AKI and elevated LFTs. Pt has hx of a fib, for which he takes Eliquis at home; pharmacy was consulted to transition pt from Eliquis to IV heparin; last dose of Eliquis was taken on 4/29).  Scr 6.40>6.53>5.15 (AKI will reduce elimination of Eliquis); will monitor anticoagulation using aPTT until effects of Eliquis are no longer influencing heparin levels, and heparin level correlates with aPTT.  Hgb down to 8.5 today (from 13.6 on admission), platelets 304 (stable).  aPTT and heparin level drawn ~9 hrs after heparin infusion started at 1200 units/hr were 55 sec (below the goal range for this pt) and >2.20 units/ml (likely due to Eliquis), respectively. Per RN, no  issues with IV or bleeding observed.  Pt had recent back surgery (cervical discectomy, fusion) ~3 weeks ago (reporting neck and back pain) and mild hematuria in ED, so using lower anticoagulation goal ranges at this time (see below).  Goal of Therapy:  Heparin level: 0.3-0.5 units/ml  aPTT 66-85 sec Monitor platelets by anticoagulation protocol: Yes   Plan:  Increase heparin infusion to 1350 units/hr Check 8-hr aPTT, heparin level Monitor daily aPTT, heparin level, CBC Monitor for signs/symptoms of bleeding  Gillermina Hu, PharmD, BCPS, [facility] Clinical Pharmacist 03/20/2020,5:32 PM

## 2020-03-20 NOTE — Progress Notes (Signed)
Patient is complaining of severe pain in his knees. No PRN medications ordered for patient. MD notified. MD to place orders. This RN also asked Horris Latino, MD if she still wanted the patient to go to a progressive unit. Per MD she stated that if the patients blood pressure dropped further then we could move patient to a progressive unit. MD stated that she would leave the order in overnight and if his B/P was stable, she would re-assess in the morning. Will continue to monitor patient.

## 2020-03-20 NOTE — Consult Note (Signed)
[facility] Renal Consultation Note  Requesting MD: Dr. Horris Latino Indication for Consultation:  Acute kidney failure  Chief complaint: fatigue and weakness  HPI:   Johnathan Murray is a 69 y.o. male is admitted to the hospital for acute kidney failure.  He has a previous medical history significant for CKD stage IIIa, CAD, hypertension, hyperlipidemia, aortic stenosis.  Nephrology was consulted due to acute kidney failure in the setting of recent surgery and poor p.o. intake.  Mr. Alsup reports that on 4/12 he underwent a day surgery in the cervical spine without complication.  During the following several days, experience significant neck and back pain decreased appetite.  In the week that followed he had increasing fatigue and weakness.  He was seen by his PCP on 4/24 at which time blood work was done which showed a significantly worsened kidney function.  His PCP instructed him to stop taking his lisinopril and may have made changes to his Lasix.  Mr. Chhabra cannot remember if changes were made to his Lasix regimen.  Regardless of his regimen, he has not been taking his medications for at least the past 2 days due to generally feeling unwell and being concerned for dehydration.  His wife has been encouraging him to be assessed in the ED for several days now but he has been resistant.  Yesterday, he was able only to walk to the couch instead couch for the rest of the day.  Once it became clear that he did not have the energy or the strength to move from the couch, his wife insisted on him being seen in the emergency room and ambulance was called.  In the ED, he was found to have labs significant for acute kidney failure with some hematuria and proteinuria.  He has been fluid resuscitated with about 3 L so far.  Nephrology was consulted for acute kidney failure.  Creatinine, Ser  Date/Time Value Ref Range Status  03/20/2020 03:31 AM 6.53 (H) 0.61 - 1.24 mg/dL Final  03/19/2020  06:01 PM 6.40 (H) 0.61 - 1.24 mg/dL Final  02/24/2020 12:28 PM 1.63 (H) 0.61 - 1.24 mg/dL Final  02/04/2020 02:51 PM 1.34 (H) 0.76 - 1.27 mg/dL Final  01/28/2020 11:43 AM 1.50 (H) 0.76 - 1.27 mg/dL Final  01/21/2020 11:39 AM 1.43 (H) 0.61 - 1.24 mg/dL Final  01/17/2020 03:28 PM 1.31 (H) 0.76 - 1.27 mg/dL Final  06/11/2019 03:28 PM 1.63 (H) 0.61 - 1.24 mg/dL Final  06/06/2019 02:50 PM 1.54 (H) 0.61 - 1.24 mg/dL Final  04/18/2019 11:41 AM 1.63 (H) 0.61 - 1.24 mg/dL Final  06/05/2018 04:20 PM 1.76 (H) 0.76 - 1.27 mg/dL Final  05/21/2018 11:30 AM 1.53 (H) 0.76 - 1.27 mg/dL Final  05/03/2018 12:00 AM 1.40 (H) 0.76 - 1.27 mg/dL Final     PMHx:   Past Medical History:  Diagnosis Date  . Abnormal LFTs (liver function tests) 10/31/2016  . Abnormal nuclear stress test 05/03/2018  . Acid reflux 02/15/2018   not at present  . Acute otitis externa of left ear 11/13/2019  . Acute suppurative otitis media of left ear without spontaneous rupture of tympanic membrane 11/13/2019  . Anemia    low iron  . Aortic stenosis 05/03/2018  . Arthritis 02/15/2018   Overview:  shoulder  . Cancer ([place])    skin cancer on left wrist  . Cervical myelopathy ([place]) 04/22/2019  . Conductive hearing loss of left ear 11/13/2019  . Coronary artery disease involving native coronary artery of native  heart without angina pectoris 05/30/2016  . Coronary artery disease of native artery of native heart with stable angina pectoris ([place])    pt. denies  . Diabetes mellitus due to underlying condition with unspecified complications ([place]) [id number]  . Dyslipidemia 05/30/2016  . Dyspnea    upon exertion,  as of 06/05/09 not having any recent chest pain  . Dysrhythmia    a fib  . Essential hypertension 05/30/2016  . Heart murmur    Has had an Aortic valve replacement  . Hyperactive gag reflex 02/15/2018  . Hypothyroidism   . Intermittent claudication ([place]) 10/31/2018  . Lumbar stenosis with neurogenic claudication 06/11/2019  .  Macular degeneration 02/15/2018  . PAF (paroxysmal atrial fibrillation) ([place]) 05/30/2016  . Pre-operative cardiovascular examination 01/03/2020  . Preoperative cardiovascular examination 04/17/2019  . Renal insufficiency 01/17/2020  . S/P aortic valve replacement 01/17/2020  . Tinnitus of left ear 11/13/2019    Past Surgical History:  Procedure Laterality Date  . ANTERIOR CERVICAL DECOMP/DISCECTOMY FUSION N/A 04/22/2019   Procedure: Anterior Cervical Discectomy and Fusion, Cervical four-five, Cervical five-six;  Surgeon: Earnie Larsson, MD;  Location: [facility];  Service: Neurosurgery;  Laterality: N/A;  . ANTERIOR CERVICAL DECOMP/DISCECTOMY FUSION N/A 03/02/2020   Procedure: Anterior Cervical Decompression Fusion  - Cervical three-Cervical four;  Surgeon: Earnie Larsson, MD;  Location: [place];  Service: Neurosurgery;  Laterality: N/A;  . AORTIC VALVE REPLACEMENT    . CARDIAC CATHETERIZATION     several stents  . CARDIAC SURGERY    . CORONARY ARTERY BYPASS GRAFT  2017   patient unsure how many, had at [facility]  . HAND SURGERY    . KNEE SURGERY    . LEFT HEART CATH AND CORONARY ANGIOGRAPHY N/A 01/21/2020   Procedure: LEFT HEART CATH AND CORONARY ANGIOGRAPHY;  Surgeon: Belva Crome, MD;  Location: [facility];  Service: Cardiovascular;  Laterality: N/A;  . LEFT HEART CATH AND CORS/GRAFTS ANGIOGRAPHY N/A 05/16/2018   Procedure: LEFT HEART CATH AND CORS/GRAFTS ANGIOGRAPHY;  Surgeon: Burnell Blanks, MD;  Location: [facility];  Service: Cardiovascular;  Laterality: N/A;  . LUMBAR LAMINECTOMY/DECOMPRESSION MICRODISCECTOMY Bilateral 06/11/2019   Procedure: Bilateral Lumbar Four-FiveLaminectomy/Foraminotomy;  Surgeon: Earnie Larsson, MD;  Location: [facility];  Service: Neurosurgery;  Laterality: Bilateral;  posterior    Family Hx:  Family History  Problem Relation Age of Onset  . Prostate cancer Brother   . Heart disease Maternal Grandmother   . Brain cancer Mother      Social History:  reports that he has never smoked. He has never used smokeless tobacco. He reports previous alcohol use. He reports that he does not use drugs.  Allergies:  Allergies  Allergen Reactions  . Atorvastatin Other (See Comments)    Urinary retention   . Nebivolol Diarrhea    Medications: Prior to Admission medications   Medication Sig Start Date End Date Taking? Authorizing Provider  amiodarone (PACERONE) 400 MG tablet Take 1 tablet (400 mg total) by mouth daily. Take 400 mg for 2 weeks then decrease to 200 mg daily. Patient taking differently: Take 200 mg by mouth at bedtime.  02/04/20  Yes Revankar, Reita Cliche, MD  amLODipine (NORVASC) 10 MG tablet Take 10 mg by mouth at bedtime.  04/14/17  Yes [provider]  apixaban (ELIQUIS) 5 MG TABS tablet Take 1 tablet (5 mg total) by mouth 2 (two) times daily. 02/04/20  Yes Revankar, Reita Cliche, MD  benazepril (LOTENSIN) 40 MG tablet Take  40 mg by mouth at bedtime.  09/23/17  Yes [provider]  Coenzyme Q10 (COQ10) 200 MG CAPS Take 200 mg by mouth daily.    Yes [provider]  ezetimibe (ZETIA) 10 MG tablet Take 1 tablet (10 mg total) by mouth at bedtime. 02/04/20  Yes Revankar, Reita Cliche, MD  furosemide (LASIX) 40 MG tablet Take 40 mg by mouth daily. 10/10/17  Yes [provider]  levothyroxine (SYNTHROID) 25 MCG tablet Take 25 mcg by mouth daily before breakfast.  01/23/20  Yes [provider]  metFORMIN (GLUCOPHAGE) 500 MG tablet Take 500 mg by mouth at bedtime.  12/28/18  Yes [provider]  metoprolol tartrate (LOPRESSOR) 50 MG tablet Take 1 tablet (50 mg total) by mouth daily as needed. Patient taking differently: Take 50 mg by mouth 3 (three) times daily as needed (afib).  01/03/20 04/02/20 Yes Revankar, Reita Cliche, MD  nitroGLYCERIN (NITROSTAT) 0.4 MG SL tablet Place 1 tablet (0.4 mg total) under the tongue every 5 (five) minutes as needed for chest pain. 01/13/20  Yes Revankar, Reita Cliche,  MD  vitamin B-12 (CYANOCOBALAMIN) 1000 MCG tablet Take 1,000 mcg by mouth daily.   Yes [provider]  vitamin C (ASCORBIC ACID) 500 MG tablet Take 500 mg by mouth daily.    Yes [provider]  zinc gluconate 50 MG tablet Take 50 mg by mouth daily.   Yes [provider]  cyclobenzaprine (FLEXERIL) 10 MG tablet Take 1 tablet (10 mg total) by mouth at bedtime. Patient not taking: Reported on 01/20/2020 03/06/19   Narda Amber K, DO    I have reviewed the patient's current medications.  Labs:  BMP Latest Ref Rng & Units 03/20/2020 03/19/2020 02/24/2020  Glucose 70 - 99 mg/dL 71 134(H) 140(H)  BUN 8 - 23 mg/dL 106(H) 98(H) 25(H)  Creatinine 0.61 - 1.24 mg/dL 6.53(H) 6.40(H) 1.63(H)  BUN/Creat Ratio 10 - 24 - - -  Sodium 135 - 145 mmol/L 133(L) 130(L) 137  Potassium 3.5 - 5.1 mmol/L 4.2 4.1 4.3  Chloride 98 - 111 mmol/L 98 94(L) 106  CO2 22 - 32 mmol/L 16(L) 19(L) 23  Calcium 8.9 - 10.3 mg/dL 9.2 10.2 9.6    Urinalysis    Component Value Date/Time   COLORURINE AMBER (A) 03/19/2020 2237   APPEARANCEUR CLOUDY (A) 03/19/2020 2237   LABSPEC 1.019 03/19/2020 2237   PHURINE 5.0 03/19/2020 2237   GLUCOSEU 50 (A) 03/19/2020 2237   HGBUR MODERATE (A) 03/19/2020 2237   BILIRUBINUR SMALL (A) 03/19/2020 2237   KETONESUR NEGATIVE 03/19/2020 2237   PROTEINUR 100 (A) 03/19/2020 2237   NITRITE NEGATIVE 03/19/2020 2237   LEUKOCYTESUR NEGATIVE 03/19/2020 2237     ROS:  Constitutional: negative, Fatigue and weakness Eyes: negative Respiratory: negative Cardiovascular: negative Gastrointestinal: negative Genitourinary:negative except for hematuria and Decreased urine output Integument/breast: negative Musculoskeletal:negative, Significant soreness in lower extremities bilaterally Neurological: negative Behavioral/Psych: negative Endocrine: negative  Physical Exam: Vitals:   03/20/20 0945 03/20/20 0955  BP: (!) 85/47 (!) 92/49  Pulse: 67 70  Resp: 20 (!) 22   Temp:    SpO2: 100% 100%      General: Elderly man.  Ill-appearing.  Tired but oriented to person, place and year.  And able to respond appropriately to questioning. HEENT: Dry mucous membranes. Respiratory: Breathing comfortably on room air.  Lungs clear to auscultation bilaterally.  No acute distress. Cardiac: 3/6 systolic murmur loudest at left sternal border with radiation to the carotids.  Regular  rate and rhythm. Abdomen: Bowel sounds normal.  Soft, nontender to palpation. Extremities: Left-sided lower extremity edema, 1+.  No significant right-sided lower extremity edema.  Extremities warm, dry.   Assessment/Plan:  AKI: Baseline creatinine 1.3-1.5.  History is significant for decreased p.o., increase use of Motrin and chronic use of lisinopril and Lasix.  High suspicion for prerenal AKI with likely progression to ATN.  Now is s/p roughly 3 L IVF and continued resuscitation at 100 cc/h.  Creatinine on admission 6.4 worsened today to 6.5.  BUN increasing from 98-106 today.   Additional small bolus was given in the ED after assessment.  We will continue to give IV fluids at 100 cc/h and monitor vitals. Hematuria: 21-50 RBCs noted on microscopy with protein on UA.  Most likely due to ATN secondary to prerenal injury.  Will begin work-up for GN and follow labs. Hypotension 2/2 hypovolemia: This morning, systolics [id number], diastolics in the [id number].  Will provide additional fluid bolus in the ED and continue fluids at 100 cc/h.  Continue to monitor vitals.  He may require midodrine for blood pressure support. Anemia, normocytic: Continue to monitor CBC. Cervical spine surgery: pain control per primary CKD stage IIIa: Does not currently follow with a nephrologist.  Currently managed by PCP.   Matilde Haymaker 03/20/2020, 10:09 AM

## 2020-03-20 NOTE — Progress Notes (Signed)
PROGRESS NOTE  [facility] [id number] DOB: 1951/07/27 DOA: 03/19/2020 PCP: Nicoletta Dress, MD  HPI/Recap of past 24 hours: HPI from Dr Julio Sicks is a 69 y.o. male with history of paroxysmal atrial fibrillation, hypertension, bioprosthetic aortic valve replacement who had undergone recent cervical discectomy and fusion about 3 weeks ago done by Dr. Trenton Gammon has been feeling weak with increasing pain in the neck and low back and poor appetite.  Denies any vomiting or diarrhea denies taking any Tylenol but does take NSAIDs sometimes.  Has not noticed any blood in the urine and has been urinating less last few days.  Has subjective feeling of fever chills.  Given the symptoms patient presents to the ER.  Patient states over the last 3 weeks and surgery patient has been finding it difficult to ambulate because of the weakness of the lower extremity.  Denies any incontinence of urine or bowel.  Has been having more lower extremity edema more on the left side. In the ER on exam patient appears generally weak with weakness of the lower extremity.  There is edema of the lower extremity on the left side.  Labs show creatinine of 6.4 which is increased from 1.6 about 3 weeks ago.  In addition patient's LFTs are markedly elevated at AST of 415 and ALT of 429 patient's albumin is 2.6 total bilirubin 5.5 bicarb 19 sodium 130 calcium 10.2 hemoglobin 10.4 lactic acid 1.6 WBC 12.6 platelets 341 urinalysis shows RBCs of 21-50 WBC 21-50 protein around 100 bacteria rare hyaline cast present mucus present. Patient was given fluid bolus and admitted for acute renal failure.  Patient also had CT abdomen pelvis and CT cervical spine which did not show any acute. USS of the abdomen also was unremarkable.  Covid test is negative.     Today, pt noted to be fatigued, still with pain in his neck and lower back.  Denies any chest pain, abdominal pain, dysuria, nausea/vomiting, fever/chills.  Patient  noted to be hypotensive despite about 3 L of IV fluid this morning.  Discussed extensively with wife at bedside.  Due to persistent hypotension, PCCM consulted.    Assessment/Plan: Principal Problem:   ARF (acute renal failure) (HCC) Active Problems:   Essential hypertension   PAF (paroxysmal atrial fibrillation) (HCC)   Abnormal LFTs (liver function tests)   Cervical myelopathy (HCC)   S/P aortic valve replacement   AKI on CKD stage III/anion gap metabolic acidosis Multifactorial from poor oral intake, in addition to medication induced from NSAID use, Lasix and lisinopril Baseline creatinine 1.3-1.6, on presentation 6.40 Noted urine with RBCs and protein-further work-up Per nephrology Continue IV fluids Hold patient's Lasix, lisinopril CT abdomen does not show any obstruction Nephrology consulted, appreciate recommendations Frequent BMP  Hypotension Unknown etiology Afebrile, with some leukocytosis  No current source of infection, UA blood, UC pending, BC x2 pending, chest x-ray unremarkable Lactic acid, TSH WNL Cortisol pending Continue IV fluids PCCM consulted, recommend starting midodrine Given recent surgery, will empirically start patient on cefepime for now, may add on p.o. Zyvox if no improvement in BP.  Plan to avoid vancomycin for now Transfer to progressive unit  Transaminitis ?? Likely 2/2 above hypotension/shock liver, denies Tylenol use INR WNL Acute hepatitis panel pending Continue IV hydration Hold home amiodarone Daily CMP  Neck and back pain History of recent cervical decompression surgery Imaging does not show anything acute as mentioned above Pain management, if pain worsens/persists we will consult Dr. Trenton Gammon patient's  neurosurgeon PT/OT  Ruptured Baker's cyst on LLE Doppler showed ruptured Baker's cyst on LLE Supportive management for now  Normocytic anemia/postop anemia Hemoglobin dropped to 8.5, no obvious signs of bleeding Anemia panel  pending Daily CBC  Paroxysmal A. Fib/bioprosthetic aortic valve replacement Heart rate WNL Hold home amiodarone Switch to IV Heparin, continue to hold home Eliquis for now due to transaminitis and the need for any procedure Telemetry  Hypothyroidism TSH levels WNL Continue Synthroid       Malnutrition Type:      Malnutrition Characteristics:      Nutrition Interventions:       Estimated body mass index is 27.27 kg/m as calculated from the following:   Height as of this encounter: 5\' 10"  (1.778 m).   Weight as of this encounter: 86.2 kg.      Code Status: Full  Family Communication: Discussed extensively with wife at bedside  Disposition Plan: Status is: Inpatient  Remains inpatient appropriate because:Hemodynamically unstable   Dispo: The patient is from: Home              Anticipated d/c is to: To be determined              Anticipated d/c date is: To be determined              Patient currently is not medically stable to d/c.-Due to patient being hemodynamically unstable, requiring extensive work-up, consultant signing off     Consultants:  Nephrology  PCCM  Procedures:  None  Antimicrobials:  Cefepime  DVT prophylaxis: IV Heparin   Objective: Vitals:   03/20/20 1015 03/20/20 1045 03/20/20 1115 03/20/20 1146  BP: (!) 92/48 100/66 (!) 93/50 110/66  Pulse: 70 76 71 71  Resp: 20 19 19 17   Temp:    98.1 F (36.7 C)  TempSrc:    Oral  SpO2: 100% 100% 100% 100%  Weight:      Height:        Intake/Output Summary (Last 24 hours) at 03/20/2020 1442 Last data filed at 03/20/2020 1329 Gross per 24 hour  Intake 4250.92 ml  Output 500 ml  Net 3750.92 ml   Filed Weights   03/20/20 0500  Weight: 86.2 kg    Exam:  General: NAD, lethargic, deconditioned  Cardiovascular: S1, S2 present  Respiratory: CTAB  Abdomen: Soft, nontender, nondistended, bowel sounds present  Musculoskeletal: Trace pedal edema noted on the RLE, +1 noted  on the LLE  Skin: Normal  Psychiatry: Normal mood  Neurology: No obvious neurologic deficits noted, although generalized weakness noted    Data Reviewed: CBC: Recent Labs  Lab 03/19/20 1801 03/20/20 0331  WBC 12.6* 13.1*  NEUTROABS 10.3* 10.5*  HGB 10.4* 8.5*  HCT 31.2* 24.8*  MCV 89.7 87.0  PLT 341 [id number]   Basic Metabolic Panel: Recent Labs  Lab 03/19/20 1801 03/20/20 0331  NA 130* 133*  K 4.1 4.2  CL 94* 98  CO2 19* 16*  GLUCOSE 134* 71  BUN 98* 106*  CREATININE 6.40* 6.53*  CALCIUM 10.2 9.2   GFR: Estimated Creatinine Clearance: 11.3 mL/min (A) (by C-G formula based on SCr of 6.53 mg/dL (H)). Liver Function Tests: Recent Labs  Lab 03/19/20 1801 03/20/20 0331  AST 415* 227*  ALT 429* 302*  ALKPHOS 158* 127*  BILITOT 5.5* 4.8*  PROT 7.8 6.5  ALBUMIN 2.6* 2.1*   No results for input(s): LIPASE, AMYLASE in the last 168 hours. No results for input(s): AMMONIA in the last 168 hours.  Coagulation Profile: Recent Labs  Lab 03/20/20 0331  INR 1.8*   Cardiac Enzymes: Recent Labs  Lab 03/19/20 1801 03/20/20 0331  CKTOTAL 20* 54   BNP (last 3 results) No results for input(s): PROBNP in the last 8760 hours. HbA1C: No results for input(s): HGBA1C in the last 72 hours. CBG: Recent Labs  Lab 03/20/20 0709 03/20/20 0726 03/20/20 0809 03/20/20 1147  GLUCAP 58* 60* 94 88   Lipid Profile: No results for input(s): CHOL, HDL, LDLCALC, TRIG, CHOLHDL, LDLDIRECT in the last 72 hours. Thyroid Function Tests: Recent Labs    03/20/20 0855  TSH 1.264   Anemia Panel: No results for input(s): VITAMINB12, FOLATE, FERRITIN, TIBC, IRON, RETICCTPCT in the last 72 hours. Urine analysis:    Component Value Date/Time   COLORURINE AMBER (A) 03/20/2020 1001   APPEARANCEUR HAZY (A) 03/20/2020 1001   LABSPEC 1.013 03/20/2020 1001   PHURINE 5.0 03/20/2020 1001   GLUCOSEU NEGATIVE 03/20/2020 1001   HGBUR SMALL (A) 03/20/2020 1001   BILIRUBINUR NEGATIVE 03/20/2020  1001   KETONESUR NEGATIVE 03/20/2020 1001   PROTEINUR 30 (A) 03/20/2020 1001   NITRITE NEGATIVE 03/20/2020 1001   LEUKOCYTESUR NEGATIVE 03/20/2020 1001   Sepsis Labs: @LABRCNTIP (procalcitonin:4,lacticidven:4)  ) Recent Results (from the past 240 hour(s))  Respiratory Panel by RT PCR (Flu A&B, Covid) - Nasopharyngeal Swab     Status: None   Collection Time: 03/19/20 10:22 PM   Specimen: Nasopharyngeal Swab  Result Value Ref Range Status   SARS Coronavirus 2 by RT PCR NEGATIVE NEGATIVE Final    Comment: (NOTE) SARS-CoV-2 target nucleic acids are NOT DETECTED. The SARS-CoV-2 RNA is generally detectable in upper respiratoy specimens during the acute phase of infection. The lowest concentration of SARS-CoV-2 viral copies this assay can detect is 131 copies/mL. A negative result does not preclude SARS-Cov-2 infection and should not be used as the sole basis for treatment or other patient management decisions. A negative result may occur with  improper specimen collection/handling, submission of specimen other than nasopharyngeal swab, presence of viral mutation(s) within the areas targeted by this assay, and inadequate number of viral copies (<131 copies/mL). A negative result must be combined with clinical observations, patient history, and epidemiological information. The expected result is Negative. Fact Sheet for Patients:  PinkCheek.be Fact Sheet for Healthcare Providers:  GravelBags.it This test is not yet ap proved or cleared by the [place] FDA and  has been authorized for detection and/or diagnosis of SARS-CoV-2 by FDA under an Emergency Use Authorization (EUA). This EUA will remain  in effect (meaning this test can be used) for the duration of the COVID-19 declaration under Section 564(b)(1) of the Act, 21 U.S.C. section 360bbb-3(b)(1), unless the authorization is terminated or revoked sooner.    Influenza A  by PCR NEGATIVE NEGATIVE Final   Influenza B by PCR NEGATIVE NEGATIVE Final    Comment: (NOTE) The Xpert Xpress SARS-CoV-2/FLU/RSV assay is intended as an aid in  the diagnosis of influenza from Nasopharyngeal swab specimens and  should not be used as a sole basis for treatment. Nasal washings and  aspirates are unacceptable for Xpert Xpress SARS-CoV-2/FLU/RSV  testing. Fact Sheet for Patients: PinkCheek.be Fact Sheet for Healthcare Providers: GravelBags.it This test is not yet approved or cleared by the [place] FDA and  has been authorized for detection and/or diagnosis of SARS-CoV-2 by  FDA under an Emergency Use Authorization (EUA). This EUA will remain  in effect (meaning this test can be used) for the duration of  the  Covid-19 declaration under Section 564(b)(1) of the Act, 21  U.S.C. section 360bbb-3(b)(1), unless the authorization is  terminated or revoked. Performed at [facility], [place] [address] [place], [place] [address]       Studies: CT ABDOMEN PELVIS WO CONTRAST  Result Date: 03/19/2020 CLINICAL DATA:  Generalized abdominal pain EXAM: CT ABDOMEN AND PELVIS WITHOUT CONTRAST TECHNIQUE: Multidetector CT imaging of the abdomen and pelvis was performed following the standard protocol without IV contrast. COMPARISON:  None. FINDINGS: Lower chest: Prosthetic aortic valve is seen. No pericardial fluid/thickening. No hiatal hernia. The visualized portions of the lungs are clear. Hepatobiliary: Although limited due to the lack of intravenous contrast, normal in appearance without gross focal abnormality. Scattered punctate calcifications are seen. No evidence of calcified gallstones or biliary ductal dilatation. Pancreas:  Unremarkable.  No surrounding inflammatory changes. Spleen: Normal in size. Although limited due to the lack of intravenous contrast, normal in appearance. Adrenals/Urinary Tract: Both adrenal  glands appear normal. Mild bilateral perinephric stranding is seen, likely due to chronic medical renal disease. No renal or collecting system calculi. No hydronephrosis. Bladder is unremarkable. Stomach/Bowel: The stomach and small bowel are normal in appearance. There is a moderate amount of colonic stool. Scattered colonic diverticula are noted without diverticulitis. Vascular/Lymphatic: There are no enlarged abdominal or pelvic lymph nodes. Scattered aortic atherosclerotic calcifications are seen without aneurysmal dilatation. Reproductive: The prostate is unremarkable. Other: No evidence of abdominal wall mass or hernia. Musculoskeletal: No acute or significant osseous findings. IMPRESSION: No renal or collecting system calculi. Colonic diverticulosis without diverticulitis. Moderate amount of colonic stool. Aortic Atherosclerosis (ICD10-I70.0). No other acute intra-abdominal or pelvic pathology to explain the patient's symptoms. Electronically Signed   By: Prudencio Pair M.D.   On: 03/19/2020 23:14   CT Cervical Spine Wo Contrast  Result Date: 03/19/2020 CLINICAL DATA:  Recent cervical surgery with neck pain EXAM: CT CERVICAL SPINE WITHOUT CONTRAST TECHNIQUE: Multidetector CT imaging of the cervical spine was performed without intravenous contrast. Multiplanar CT image reconstructions were also generated. COMPARISON:  CT 12/27/2019 FINDINGS: Alignment: Mild straightening. No subluxation. Facet alignment is maintained. Skull base and vertebrae: No acute fracture. No primary bone lesion or focal pathologic process. Soft tissues and spinal canal: No prevertebral fluid or swelling. No visible canal hematoma. Disc levels: Interval removal of fixating plate and screws from C4 through C6 with hardware tracks present in C5 and C6 vertebral bodies. Intervertebral disc devices are present at C4-C5 and C5-C6. Interval placement of anterior plate and fixating screws in addition to interbody device at C3-C4. There  appears to be resection of the posteroinferior aspect of C3 and superior posterior aspect of C4. Cut margins appear smooth. Degenerative changes at C6-C7 and C7-T1. Upper chest: Carotid vascular calcifications. Lung apices are clear. Other: No gross retropharyngeal gas or fluid collection. IMPRESSION: 1. Interval removal of fixating plate and screws from C4 through C6 with interval placement of surgical plate, fixating screws and interbody device at C3-C4. No acute osseous abnormality. Electronically Signed   By: Donavan Foil M.D.   On: 03/19/2020 23:19   US Abdomen Limited  Result Date: 03/20/2020 CLINICAL DATA:  Right upper quadrant pain EXAM: ULTRASOUND ABDOMEN LIMITED RIGHT UPPER QUADRANT COMPARISON:  None. FINDINGS: Gallbladder: Partially decompressed gallbladder. No gallstones or wall thickening visualized. No sonographic Murphy sign noted by sonographer. Common bile duct: Diameter: 6 mm Liver: Increased echotexture seen throughout. No focal abnormality or biliary ductal dilatation. Portal vein is patent on color Doppler imaging  with normal direction of blood flow towards the liver. Other: None. IMPRESSION: Hepatic steatosis. Partially decompressed gallbladder otherwise unremarkable. Electronically Signed   By: Prudencio Pair M.D.   On: 03/20/2020 00:37   DG Chest Port 1 View  Result Date: 03/20/2020 CLINICAL DATA:  Acute kidney injury EXAM: PORTABLE CHEST 1 VIEW COMPARISON:  05/03/2018 chest radiograph. FINDINGS: Intact sternotomy wires. Cardiac valvular prosthesis and CABG clips noted. Stable cardiomediastinal silhouette with top-normal heart size. No pneumothorax. No pleural effusion. Lungs appear clear, with no acute consolidative airspace disease and no pulmonary edema. IMPRESSION: No active disease. Electronically Signed   By: Ilona Sorrel M.D.   On: 03/20/2020 12:29   VAS [place] LOWER EXTREMITY VENOUS (DVT) (MC and [facility] 7a-7p)  Result Date: 03/20/2020  Lower Venous DVTStudy Indications: Pain, and  Edema.  Performing Technologist: June Leap RDMS, RVT  Examination Guidelines: A complete evaluation includes B-mode imaging, spectral Doppler, color Doppler, and power Doppler as needed of all accessible portions of each vessel. Bilateral testing is considered an integral part of a complete examination. Limited examinations for reoccurring indications may be performed as noted. The reflux portion of the exam is performed with the patient in reverse Trendelenburg.  +---------+---------------+---------+-----------+----------+--------------+ LEFT     CompressibilityPhasicitySpontaneityPropertiesThrombus Aging +---------+---------------+---------+-----------+----------+--------------+ CFV      Full           Yes      Yes                                 +---------+---------------+---------+-----------+----------+--------------+ SFJ      Full                                                        +---------+---------------+---------+-----------+----------+--------------+ FV Prox  Full                                                        +---------+---------------+---------+-----------+----------+--------------+ FV Mid   Full                                                        +---------+---------------+---------+-----------+----------+--------------+ FV DistalFull                                                        +---------+---------------+---------+-----------+----------+--------------+ PFV      Full                                                        +---------+---------------+---------+-----------+----------+--------------+ POP      Full           Yes  Yes                                 +---------+---------------+---------+-----------+----------+--------------+ PTV      Full                                                        +---------+---------------+---------+-----------+----------+--------------+ PERO     Full                                                         +---------+---------------+---------+-----------+----------+--------------+     Summary: LEFT: - There is no evidence of deep vein thrombosis in the lower extremity.  - Large collection of fluid with internal echoes extending from popliteal fossa to lower calf. Appears to be large ruptured baker's cyst.  *See table(s) above for measurements and observations. Electronically signed by Monica Martinez MD on 03/20/2020 at 2:00:07 PM.    Final     Scheduled Meds: . insulin aspart  0-6 Units Subcutaneous TID WC  . levothyroxine  25 mcg Oral Q0600  . midodrine  10 mg Oral TID WC  . sodium chloride flush  3 mL Intravenous Once    Continuous Infusions: . [START ON 03/21/2020] ceFEPime (MAXIPIME) IV    . heparin 1,200 Units/hr (03/20/20 [id number])  . lactated ringers Stopped (03/20/20 1031)     LOS: 0 days     Alma Friendly, MD [facility]  If 7PM-7AM, please contact night-coverage [email or web address] 03/20/2020, 2:42 PM

## 2020-03-20 NOTE — Progress Notes (Signed)
Per Laverda Sorenson, AD patients wife is allowed to stay the night with the patient, but she is the only person allowed to stay. Patient and wife updated of this information.

## 2020-03-20 NOTE — ED Provider Notes (Signed)
[name] Provider Note   CSN: [id number] Arrival date & time: 03/19/20  1658     History Chief Complaint  Patient presents with  . Back Pain  . Neck Pain    Johnathan Murray is a 69 y.o. male.  Presented to ER with complaint of generalized fatigue, poor appetite, neck pain.  Patient reports ever since he had a surgical surgery on April 12 he has not been feeling very good, states he has a very poor appetite, has had significant weakness over the past week or so.  Felt chills at home.  Had some low back pain earlier today but not currently having any back pain now, no abdominal pain.  States that he has also had some intermittent neck pain ever since surgery.  Chart review, 4/12 C3-4 anterior cervical decompression and fusion.  HPI     Past Medical History:  Diagnosis Date  . Abnormal LFTs (liver function tests) 10/31/2016  . Abnormal nuclear stress test 05/03/2018  . Acid reflux 02/15/2018   not at present  . Acute otitis externa of left ear 11/13/2019  . Acute suppurative otitis media of left ear without spontaneous rupture of tympanic membrane 11/13/2019  . Anemia    low iron  . Aortic stenosis 05/03/2018  . Arthritis 02/15/2018   Overview:  shoulder  . Cancer ([place])    skin cancer on left wrist  . Cervical myelopathy ([place]) 04/22/2019  . Conductive hearing loss of left ear 11/13/2019  . Coronary artery disease involving native coronary artery of native heart without angina pectoris 05/30/2016  . Coronary artery disease of native artery of native heart with stable angina pectoris ([place])    pt. denies  . Diabetes mellitus due to underlying condition with unspecified complications ([place]) 9/93/7169  . Dyslipidemia 05/30/2016  . Dyspnea    upon exertion,  as of 06/05/09 not having any recent chest pain  . Dysrhythmia    a fib  . Essential hypertension 05/30/2016  . Heart murmur    Has had an Aortic valve replacement  . Hyperactive gag  reflex 02/15/2018  . Hypothyroidism   . Intermittent claudication ([place]) 10/31/2018  . Lumbar stenosis with neurogenic claudication 06/11/2019  . Macular degeneration 02/15/2018  . PAF (paroxysmal atrial fibrillation) ([place]) 05/30/2016  . Pre-operative cardiovascular examination 01/03/2020  . Preoperative cardiovascular examination 04/17/2019  . Renal insufficiency 01/17/2020  . S/P aortic valve replacement 01/17/2020  . Tinnitus of left ear 11/13/2019    Patient Active Problem List   Diagnosis Date Noted  . ARF (acute renal failure) ([place]) 03/20/2020  . S/P aortic valve replacement 01/17/2020  . Renal insufficiency 01/17/2020  . Pre-operative cardiovascular examination 01/03/2020  . Acute otitis externa of left ear 11/13/2019  . Acute suppurative otitis media of left ear without spontaneous rupture of tympanic membrane 11/13/2019  . Conductive hearing loss of left ear 11/13/2019  . Tinnitus of left ear 11/13/2019  . Lumbar stenosis with neurogenic claudication 06/11/2019  . Cervical myelopathy ([place]) 04/22/2019  . Preoperative cardiovascular examination 04/17/2019  . Intermittent claudication ([place]) 10/31/2018  . Coronary artery disease of native artery of native heart with stable angina pectoris ([place])   . Abnormal nuclear stress test 05/03/2018  . Aortic stenosis 05/03/2018  . Acid reflux 02/15/2018  . Arthritis 02/15/2018  . Hyperactive gag reflex 02/15/2018  . Macular degeneration 02/15/2018  . Abnormal LFTs (liver function tests) 10/31/2016  . Coronary artery disease involving native coronary artery of native heart without  angina pectoris 05/30/2016  . Diabetes mellitus due to underlying condition with unspecified complications ([place]) 26/71/2458  . Dyslipidemia 05/30/2016  . Essential hypertension 05/30/2016  . PAF (paroxysmal atrial fibrillation) ([place]) 05/30/2016    Past Surgical History:  Procedure Laterality Date  . ANTERIOR CERVICAL DECOMP/DISCECTOMY FUSION N/A 04/22/2019    Procedure: Anterior Cervical Discectomy and Fusion, Cervical four-five, Cervical five-six;  Surgeon: Earnie Larsson, MD;  Location: [facility];  Service: Neurosurgery;  Laterality: N/A;  . ANTERIOR CERVICAL DECOMP/DISCECTOMY FUSION N/A 03/02/2020   Procedure: Anterior Cervical Decompression Fusion  - Cervical three-Cervical four;  Surgeon: Earnie Larsson, MD;  Location: [facility];  Service: Neurosurgery;  Laterality: N/A;  . AORTIC VALVE REPLACEMENT    . CARDIAC CATHETERIZATION     several stents  . CARDIAC SURGERY    . CORONARY ARTERY BYPASS GRAFT  2017   patient unsure how many, had at [facility]  . HAND SURGERY    . KNEE SURGERY    . LEFT HEART CATH AND CORONARY ANGIOGRAPHY N/A 01/21/2020   Procedure: LEFT HEART CATH AND CORONARY ANGIOGRAPHY;  Surgeon: Belva Crome, MD;  Location: [facility];  Service: Cardiovascular;  Laterality: N/A;  . LEFT HEART CATH AND CORS/GRAFTS ANGIOGRAPHY N/A 05/16/2018   Procedure: LEFT HEART CATH AND CORS/GRAFTS ANGIOGRAPHY;  Surgeon: Burnell Blanks, MD;  Location: [facility];  Service: Cardiovascular;  Laterality: N/A;  . LUMBAR LAMINECTOMY/DECOMPRESSION MICRODISCECTOMY Bilateral 06/11/2019   Procedure: Bilateral Lumbar Four-FiveLaminectomy/Foraminotomy;  Surgeon: Earnie Larsson, MD;  Location: [facility];  Service: Neurosurgery;  Laterality: Bilateral;  posterior       Family History  Problem Relation Age of Onset  . Prostate cancer Brother   . Heart disease Maternal Grandmother   . Brain cancer Mother     Social History   Tobacco Use  . Smoking status: Never Smoker  . Smokeless tobacco: Never Used  Substance Use Topics  . Alcohol use: Not Currently  . Drug use: No    Home Medications Prior to Admission medications   Medication Sig Start Date End Date Taking? Authorizing Provider  amiodarone (PACERONE) 400 MG tablet Take 1 tablet (400 mg total) by mouth daily. Take 400 mg for 2 weeks then decrease to 200 mg daily. Patient  taking differently: Take 200 mg by mouth at bedtime.  02/04/20  Yes Revankar, Reita Cliche, MD  amLODipine (NORVASC) 10 MG tablet Take 10 mg by mouth at bedtime.  04/14/17  Yes [provider]  apixaban (ELIQUIS) 5 MG TABS tablet Take 1 tablet (5 mg total) by mouth 2 (two) times daily. 02/04/20  Yes Revankar, Reita Cliche, MD  benazepril (LOTENSIN) 40 MG tablet Take 40 mg by mouth at bedtime.  09/23/17  Yes [provider]  Coenzyme Q10 (COQ10) 200 MG CAPS Take 200 mg by mouth daily.    Yes [provider]  ezetimibe (ZETIA) 10 MG tablet Take 1 tablet (10 mg total) by mouth at bedtime. 02/04/20  Yes Revankar, Reita Cliche, MD  furosemide (LASIX) 40 MG tablet Take 40 mg by mouth daily. 10/10/17  Yes [provider]  levothyroxine (SYNTHROID) 25 MCG tablet Take 25 mcg by mouth daily before breakfast.  01/23/20  Yes [provider]  metFORMIN (GLUCOPHAGE) 500 MG tablet Take 500 mg by mouth at bedtime.  12/28/18  Yes [provider]  metoprolol tartrate (LOPRESSOR) 50 MG tablet Take 1 tablet (50 mg total) by mouth daily as needed. Patient taking differently: Take 50 mg  by mouth 3 (three) times daily as needed (afib).  01/03/20 04/02/20 Yes Revankar, Reita Cliche, MD  nitroGLYCERIN (NITROSTAT) 0.4 MG SL tablet Place 1 tablet (0.4 mg total) under the tongue every 5 (five) minutes as needed for chest pain. 01/13/20  Yes Revankar, Reita Cliche, MD  vitamin B-12 (CYANOCOBALAMIN) 1000 MCG tablet Take 1,000 mcg by mouth daily.   Yes [provider]  vitamin C (ASCORBIC ACID) 500 MG tablet Take 500 mg by mouth daily.    Yes [provider]  zinc gluconate 50 MG tablet Take 50 mg by mouth daily.   Yes [provider]  cyclobenzaprine (FLEXERIL) 10 MG tablet Take 1 tablet (10 mg total) by mouth at bedtime. Patient not taking: Reported on 01/20/2020 03/06/19   Narda Amber K, DO    Allergies    Atorvastatin and Nebivolol  Review of Systems   Review of Systems    Constitutional: Positive for chills and fatigue.  HENT: Negative for ear pain and sore throat.   Eyes: Negative for pain and visual disturbance.  Respiratory: Negative for cough and shortness of breath.   Cardiovascular: Negative for chest pain and palpitations.  Gastrointestinal: Negative for abdominal pain and vomiting.  Genitourinary: Negative for dysuria and hematuria.  Musculoskeletal: Positive for back pain. Negative for arthralgias.  Skin: Negative for color change and rash.  Neurological: Negative for seizures and syncope.  All other systems reviewed and are negative.   Physical Exam Updated Vital Signs BP (!) 133/109 (BP Location: Left Arm)   Pulse 99   Temp 98.6 F (37 C) (Oral)   Resp 14   SpO2 99%   Physical Exam Vitals and nursing note reviewed.  Constitutional:      Appearance: He is well-developed.     Comments: jaundiced  HENT:     Head: Normocephalic and atraumatic.  Eyes:     Comments: Scleral icterus  Neck:     Comments: Neck looks well, no swelling, no erythema, no induration Cardiovascular:     Rate and Rhythm: Normal rate and regular rhythm.     Heart sounds: No murmur.  Pulmonary:     Effort: Pulmonary effort is normal. No respiratory distress.     Breath sounds: Normal breath sounds.  Abdominal:     Palpations: Abdomen is soft.     Tenderness: There is no abdominal tenderness.  Musculoskeletal:     Cervical back: Neck supple.     Comments: Mild swelling and tenderness noted over left lower leg  Skin:    General: Skin is warm and dry.     Capillary Refill: Capillary refill takes less than 2 seconds.  Neurological:     General: No focal deficit present.     Mental Status: He is alert and oriented to person, place, and time.     ED Results / Procedures / Treatments   Labs (all labs ordered are listed, but only abnormal results are displayed) Labs Reviewed  COMPREHENSIVE METABOLIC PANEL - Abnormal; Notable for the following components:       Result Value   Sodium 130 (*)    Chloride 94 (*)    CO2 19 (*)    Glucose, Bld 134 (*)    BUN 98 (*)    Creatinine, Ser 6.40 (*)    Albumin 2.6 (*)    AST 415 (*)    ALT 429 (*)    Alkaline Phosphatase 158 (*)    Total Bilirubin 5.5 (*)    GFR calc  non Af Amer 8 (*)    GFR calc Af Amer 10 (*)    Anion gap 17 (*)    All other components within normal limits  CBC WITH DIFFERENTIAL/PLATELET - Abnormal; Notable for the following components:   WBC 12.6 (*)    RBC 3.48 (*)    Hemoglobin 10.4 (*)    HCT 31.2 (*)    Neutro Abs 10.3 (*)    Abs Immature Granulocytes 0.24 (*)    All other components within normal limits  URINALYSIS, ROUTINE W REFLEX MICROSCOPIC - Abnormal; Notable for the following components:   Color, Urine AMBER (*)    APPearance CLOUDY (*)    Glucose, UA 50 (*)    Hgb urine dipstick MODERATE (*)    Bilirubin Urine SMALL (*)    Protein, ur 100 (*)    Bacteria, UA RARE (*)    All other components within normal limits  RESPIRATORY PANEL BY RT PCR (FLU A&B, COVID)  URINE CULTURE  LACTIC ACID, PLASMA  CK    EKG None  Radiology CT ABDOMEN PELVIS WO CONTRAST  Result Date: 03/19/2020 CLINICAL DATA:  Generalized abdominal pain EXAM: CT ABDOMEN AND PELVIS WITHOUT CONTRAST TECHNIQUE: Multidetector CT imaging of the abdomen and pelvis was performed following the standard protocol without IV contrast. COMPARISON:  None. FINDINGS: Lower chest: Prosthetic aortic valve is seen. No pericardial fluid/thickening. No hiatal hernia. The visualized portions of the lungs are clear. Hepatobiliary: Although limited due to the lack of intravenous contrast, normal in appearance without gross focal abnormality. Scattered punctate calcifications are seen. No evidence of calcified gallstones or biliary ductal dilatation. Pancreas:  Unremarkable.  No surrounding inflammatory changes. Spleen: Normal in size. Although limited due to the lack of intravenous contrast, normal in appearance.  Adrenals/Urinary Tract: Both adrenal glands appear normal. Mild bilateral perinephric stranding is seen, likely due to chronic medical renal disease. No renal or collecting system calculi. No hydronephrosis. Bladder is unremarkable. Stomach/Bowel: The stomach and small bowel are normal in appearance. There is a moderate amount of colonic stool. Scattered colonic diverticula are noted without diverticulitis. Vascular/Lymphatic: There are no enlarged abdominal or pelvic lymph nodes. Scattered aortic atherosclerotic calcifications are seen without aneurysmal dilatation. Reproductive: The prostate is unremarkable. Other: No evidence of abdominal wall mass or hernia. Musculoskeletal: No acute or significant osseous findings. IMPRESSION: No renal or collecting system calculi. Colonic diverticulosis without diverticulitis. Moderate amount of colonic stool. Aortic Atherosclerosis (ICD10-I70.0). No other acute intra-abdominal or pelvic pathology to explain the patient's symptoms. Electronically Signed   By: Prudencio Pair M.D.   On: 03/19/2020 23:14   CT Cervical Spine Wo Contrast  Result Date: 03/19/2020 CLINICAL DATA:  Recent cervical surgery with neck pain EXAM: CT CERVICAL SPINE WITHOUT CONTRAST TECHNIQUE: Multidetector CT imaging of the cervical spine was performed without intravenous contrast. Multiplanar CT image reconstructions were also generated. COMPARISON:  CT 12/27/2019 FINDINGS: Alignment: Mild straightening. No subluxation. Facet alignment is maintained. Skull base and vertebrae: No acute fracture. No primary bone lesion or focal pathologic process. Soft tissues and spinal canal: No prevertebral fluid or swelling. No visible canal hematoma. Disc levels: Interval removal of fixating plate and screws from C4 through C6 with hardware tracks present in C5 and C6 vertebral bodies. Intervertebral disc devices are present at C4-C5 and C5-C6. Interval placement of anterior plate and fixating screws in addition to  interbody device at C3-C4. There appears to be resection of the posteroinferior aspect of C3 and superior posterior aspect of C4. Cut  margins appear smooth. Degenerative changes at C6-C7 and C7-T1. Upper chest: Carotid vascular calcifications. Lung apices are clear. Other: No gross retropharyngeal gas or fluid collection. IMPRESSION: 1. Interval removal of fixating plate and screws from C4 through C6 with interval placement of surgical plate, fixating screws and interbody device at C3-C4. No acute osseous abnormality. Electronically Signed   By: Donavan Foil M.D.   On: 03/19/2020 23:19   US Abdomen Limited  Result Date: 03/20/2020 CLINICAL DATA:  Right upper quadrant pain EXAM: ULTRASOUND ABDOMEN LIMITED RIGHT UPPER QUADRANT COMPARISON:  None. FINDINGS: Gallbladder: Partially decompressed gallbladder. No gallstones or wall thickening visualized. No sonographic Murphy sign noted by sonographer. Common bile duct: Diameter: 6 mm Liver: Increased echotexture seen throughout. No focal abnormality or biliary ductal dilatation. Portal vein is patent on color Doppler imaging with normal direction of blood flow towards the liver. Other: None. IMPRESSION: Hepatic steatosis. Partially decompressed gallbladder otherwise unremarkable. Electronically Signed   By: Prudencio Pair M.D.   On: 03/20/2020 00:37    Procedures Procedures (including critical care time)  Medications Ordered in ED Medications  sodium chloride flush (NS) 0.9 % injection 3 mL (has no administration in time range)  oxyCODONE (Oxy IR/ROXICODONE) immediate release tablet 5 mg (5 mg Oral Given 03/19/20 2237)    ED Course  I have reviewed the triage vital signs and the nursing notes.  Pertinent labs & imaging results that were available during my care of the patient were reviewed by me and considered in my medical decision making (see chart for details).    MDM Rules/Calculators/A&P                      69 year old male presenting to ER with  concern for generalized fatigue, poor appetite.  On exam, patient had relatively normal vital signs, afebrile here, noted to have jaundiced appearing skin but no focal abdominal tenderness.  Did note mild swelling and tenderness of his left calf.  Work-up concerning for new onset renal failure, elevation in LFTs, T bili, alk phos.  Provided patient fluids, obtained CT abdomen pelvis as well as CT cervical spine given recent surgery and ultrasound of his right upper quadrant.  Imaging studies were grossly negative, without acute finding.  Urine with no infection but did have elevated RBCs, concern for possible glomerulonephritis or other acute renal process.  [facility], Dr. Marval Regal said someone from the nephrology team would evaluate in the a.m. Pt is currently making urine, mental status is okay, potassium and bicarb okay, no emergent indications for dialysis. Consulted hospitalist, Dr. Hal Hope, he will admit patient for further management.  Final Clinical Impression(s) / ED Diagnoses Final diagnoses:  Acute kidney injury (HCC)  Elevated LFTs  Serum total bilirubin elevated  Acute renal failure, unspecified acute renal failure type [facility])    Rx / DC Orders ED Discharge Orders    None       Lucrezia Starch, MD 03/20/20 [phone]

## 2020-03-20 NOTE — H&P (Signed)
History and Physical    Johnathan Murray [id number] DOB: Sep 13, 1951 DOA: 03/19/2020  PCP: Nicoletta Dress, MD  Patient coming from: Home.  Chief Complaint: Weakness neck pain back pain.  HPI: Johnathan Murray is a 69 y.o. male with history of paroxysmal atrial fibrillation, hypertension, bioprosthetic aortic valve replacement who had undergone recent cervical discectomy and fusion about 3 weeks ago done by Dr. Trenton Gammon has been feeling weak with increasing pain in the neck and low back and poor appetite.  Denies any vomiting or diarrhea denies taking any Tylenol but does take NSAIDs sometimes.  Has not noticed any blood in the urine and has been urinating less last few days.  Has subjective feeling of fever chills.  Given the symptoms patient presents to the ER.  Patient states over the last 3 weeks and surgery patient has been finding it difficult to ambulate because of the weakness of the lower extremity.  Denies any incontinence of urine or bowel.  Has been having more lower extremity edema more on the left side.  ED Course: In the ER on exam patient appears generally weak with weakness of the lower extremity.  There is edema of the lower extremity on the left side.  Labs show creatinine of 6.4 which is increased from 1.6 about 3 weeks ago.  In addition patient's LFTs are markedly elevated at AST of 415 and ALT of 429 patient's albumin is 2.6 total bilirubin 5.5 bicarb 19 sodium 130 calcium 10.2 hemoglobin 10.4 lactic acid 1.6 WBC 12.6 platelets 341 urinalysis shows RBCs of 21-50 WBC 21-50 protein around 100 bacteria rare hyaline cast present mucus present.  Patient blood pressure was in the low normal.  Patient was given fluid bolus and admitted for acute renal failure.  Patient also had CT abdomen pelvis and CT cervical spine which did not show any acute.  Sonogram of the abdomen also was unremarkable.  Covid test is negative.  Review of Systems: As per HPI, rest all negative.   Past  Medical History:  Diagnosis Date  . Abnormal LFTs (liver function tests) 10/31/2016  . Abnormal nuclear stress test 05/03/2018  . Acid reflux 02/15/2018   not at present  . Acute otitis externa of left ear 11/13/2019  . Acute suppurative otitis media of left ear without spontaneous rupture of tympanic membrane 11/13/2019  . Anemia    low iron  . Aortic stenosis 05/03/2018  . Arthritis 02/15/2018   Overview:  shoulder  . Cancer ([place])    skin cancer on left wrist  . Cervical myelopathy ([place]) 04/22/2019  . Conductive hearing loss of left ear 11/13/2019  . Coronary artery disease involving native coronary artery of native heart without angina pectoris 05/30/2016  . Coronary artery disease of native artery of native heart with stable angina pectoris ([place])    pt. denies  . Diabetes mellitus due to underlying condition with unspecified complications ([place]) [id number]  . Dyslipidemia 05/30/2016  . Dyspnea    upon exertion,  as of 06/05/09 not having any recent chest pain  . Dysrhythmia    a fib  . Essential hypertension 05/30/2016  . Heart murmur    Has had an Aortic valve replacement  . Hyperactive gag reflex 02/15/2018  . Hypothyroidism   . Intermittent claudication ([place]) 10/31/2018  . Lumbar stenosis with neurogenic claudication 06/11/2019  . Macular degeneration 02/15/2018  . PAF (paroxysmal atrial fibrillation) ([place]) 05/30/2016  . Pre-operative cardiovascular examination 01/03/2020  . Preoperative cardiovascular examination 04/17/2019  .  Renal insufficiency 01/17/2020  . S/P aortic valve replacement 01/17/2020  . Tinnitus of left ear 11/13/2019    Past Surgical History:  Procedure Laterality Date  . ANTERIOR CERVICAL DECOMP/DISCECTOMY FUSION N/A 04/22/2019   Procedure: Anterior Cervical Discectomy and Fusion, Cervical four-five, Cervical five-six;  Surgeon: Earnie Larsson, MD;  Location: [facility];  Service: Neurosurgery;  Laterality: N/A;  . ANTERIOR CERVICAL DECOMP/DISCECTOMY FUSION N/A 03/02/2020    Procedure: Anterior Cervical Decompression Fusion  - Cervical three-Cervical four;  Surgeon: Earnie Larsson, MD;  Location: [facility];  Service: Neurosurgery;  Laterality: N/A;  . AORTIC VALVE REPLACEMENT    . CARDIAC CATHETERIZATION     several stents  . CARDIAC SURGERY    . CORONARY ARTERY BYPASS GRAFT  2017   patient unsure how many, had at [facility]  . HAND SURGERY    . KNEE SURGERY    . LEFT HEART CATH AND CORONARY ANGIOGRAPHY N/A 01/21/2020   Procedure: LEFT HEART CATH AND CORONARY ANGIOGRAPHY;  Surgeon: Belva Crome, MD;  Location: [facility];  Service: Cardiovascular;  Laterality: N/A;  . LEFT HEART CATH AND CORS/GRAFTS ANGIOGRAPHY N/A 05/16/2018   Procedure: LEFT HEART CATH AND CORS/GRAFTS ANGIOGRAPHY;  Surgeon: Burnell Blanks, MD;  Location: [facility];  Service: Cardiovascular;  Laterality: N/A;  . LUMBAR LAMINECTOMY/DECOMPRESSION MICRODISCECTOMY Bilateral 06/11/2019   Procedure: Bilateral Lumbar Four-FiveLaminectomy/Foraminotomy;  Surgeon: Earnie Larsson, MD;  Location: [facility];  Service: Neurosurgery;  Laterality: Bilateral;  posterior     reports that he has never smoked. He has never used smokeless tobacco. He reports previous alcohol use. He reports that he does not use drugs.  Allergies  Allergen Reactions  . Atorvastatin Other (See Comments)    Urinary retention   . Nebivolol Diarrhea    Family History  Problem Relation Age of Onset  . Prostate cancer Brother   . Heart disease Maternal Grandmother   . Brain cancer Mother     Prior to Admission medications   Medication Sig Start Date End Date Taking? Authorizing Provider  amiodarone (PACERONE) 400 MG tablet Take 1 tablet (400 mg total) by mouth daily. Take 400 mg for 2 weeks then decrease to 200 mg daily. Patient taking differently: Take 200 mg by mouth at bedtime.  02/04/20  Yes Revankar, Reita Cliche, MD  amLODipine (NORVASC) 10 MG tablet Take 10 mg by mouth at bedtime.  04/14/17  Yes  [provider]  apixaban (ELIQUIS) 5 MG TABS tablet Take 1 tablet (5 mg total) by mouth 2 (two) times daily. 02/04/20  Yes Revankar, Reita Cliche, MD  benazepril (LOTENSIN) 40 MG tablet Take 40 mg by mouth at bedtime.  09/23/17  Yes [provider]  Coenzyme Q10 (COQ10) 200 MG CAPS Take 200 mg by mouth daily.    Yes [provider]  ezetimibe (ZETIA) 10 MG tablet Take 1 tablet (10 mg total) by mouth at bedtime. 02/04/20  Yes Revankar, Reita Cliche, MD  furosemide (LASIX) 40 MG tablet Take 40 mg by mouth daily. 10/10/17  Yes [provider]  levothyroxine (SYNTHROID) 25 MCG tablet Take 25 mcg by mouth daily before breakfast.  01/23/20  Yes [provider]  metFORMIN (GLUCOPHAGE) 500 MG tablet Take 500 mg by mouth at bedtime.  12/28/18  Yes [provider]  metoprolol tartrate (LOPRESSOR) 50 MG tablet Take 1 tablet (50 mg total) by mouth daily as needed. Patient taking differently: Take 50 mg by mouth 3 (three) times daily as  needed (afib).  01/03/20 04/02/20 Yes Revankar, Reita Cliche, MD  nitroGLYCERIN (NITROSTAT) 0.4 MG SL tablet Place 1 tablet (0.4 mg total) under the tongue every 5 (five) minutes as needed for chest pain. 01/13/20  Yes Revankar, Reita Cliche, MD  vitamin B-12 (CYANOCOBALAMIN) 1000 MCG tablet Take 1,000 mcg by mouth daily.   Yes [provider]  vitamin C (ASCORBIC ACID) 500 MG tablet Take 500 mg by mouth daily.    Yes [provider]  zinc gluconate 50 MG tablet Take 50 mg by mouth daily.   Yes [provider]  cyclobenzaprine (FLEXERIL) 10 MG tablet Take 1 tablet (10 mg total) by mouth at bedtime. Patient not taking: Reported on 01/20/2020 03/06/19   Alda Berthold, DO    Physical Exam: Constitutional: Moderately built and nourished. Vitals:   03/19/20 1752 03/19/20 2103 03/20/20 0254  BP: (!) 129/96 (!) 133/109 115/67  Pulse: 93 99 70  Resp: 18 14 16   Temp: 98.6 F (37 C)    TempSrc: Oral    SpO2: 100% 99% 95%    Eyes: Anicteric no pallor. ENMT: No discharge from the ears eyes nose or mouth. Neck: No mass felt.  No neck rigidity. Respiratory: No rhonchi or crepitations. Cardiovascular: S1-S2 heard. Abdomen: Soft nontender bowel sounds present. Musculoskeletal: Mild edema of the lower extremities.  More the left side. Skin: No rash. Neurologic: Alert awake oriented time place and person.  Moves upper extremities without difficulty has some difficulty moving lower extremity patient attributes it to pain. Psychiatric: Appears normal but normal affect.   Labs on Admission: I have personally reviewed following labs and imaging studies  CBC: Recent Labs  Lab 03/19/20 1801  WBC 12.6*  NEUTROABS 10.3*  HGB 10.4*  HCT 31.2*  MCV 89.7  PLT [id number]   Basic Metabolic Panel: Recent Labs  Lab 03/19/20 1801  NA 130*  K 4.1  CL 94*  CO2 19*  GLUCOSE 134*  BUN 98*  CREATININE 6.40*  CALCIUM 10.2   GFR: CrCl cannot be calculated (Unknown ideal weight.). Liver Function Tests: Recent Labs  Lab 03/19/20 1801  AST 415*  ALT 429*  ALKPHOS 158*  BILITOT 5.5*  PROT 7.8  ALBUMIN 2.6*   No results for input(s): LIPASE, AMYLASE in the last 168 hours. No results for input(s): AMMONIA in the last 168 hours. Coagulation Profile: No results for input(s): INR, PROTIME in the last 168 hours. Cardiac Enzymes: Recent Labs  Lab 03/19/20 1801  CKTOTAL 20*   BNP (last 3 results) No results for input(s): PROBNP in the last 8760 hours. HbA1C: No results for input(s): HGBA1C in the last 72 hours. CBG: No results for input(s): GLUCAP in the last 168 hours. Lipid Profile: No results for input(s): CHOL, HDL, LDLCALC, TRIG, CHOLHDL, LDLDIRECT in the last 72 hours. Thyroid Function Tests: No results for input(s): TSH, T4TOTAL, FREET4, T3FREE, THYROIDAB in the last 72 hours. Anemia Panel: No results for input(s): VITAMINB12, FOLATE, FERRITIN, TIBC, IRON, RETICCTPCT in the last 72 hours. Urine  analysis:    Component Value Date/Time   COLORURINE AMBER (A) 03/19/2020 2237   APPEARANCEUR CLOUDY (A) 03/19/2020 2237   LABSPEC 1.019 03/19/2020 2237   PHURINE 5.0 03/19/2020 2237   GLUCOSEU 50 (A) 03/19/2020 2237   HGBUR MODERATE (A) 03/19/2020 2237   BILIRUBINUR SMALL (A) 03/19/2020 2237   KETONESUR NEGATIVE 03/19/2020 2237   PROTEINUR 100 (A) 03/19/2020 2237   NITRITE NEGATIVE 03/19/2020 2237   LEUKOCYTESUR NEGATIVE 03/19/2020 2237   Sepsis  Labs: @LABRCNTIP (procalcitonin:4,lacticidven:4) ) Recent Results (from the past 240 hour(s))  Respiratory Panel by RT PCR (Flu A&B, Covid) - Nasopharyngeal Swab     Status: None   Collection Time: 03/19/20 10:22 PM   Specimen: Nasopharyngeal Swab  Result Value Ref Range Status   SARS Coronavirus 2 by RT PCR NEGATIVE NEGATIVE Final    Comment: (NOTE) SARS-CoV-2 target nucleic acids are NOT DETECTED. The SARS-CoV-2 RNA is generally detectable in upper respiratoy specimens during the acute phase of infection. The lowest concentration of SARS-CoV-2 viral copies this assay can detect is 131 copies/mL. A negative result does not preclude SARS-Cov-2 infection and should not be used as the sole basis for treatment or other patient management decisions. A negative result may occur with  improper specimen collection/handling, submission of specimen other than nasopharyngeal swab, presence of viral mutation(s) within the areas targeted by this assay, and inadequate number of viral copies (<131 copies/mL). A negative result must be combined with clinical observations, patient history, and epidemiological information. The expected result is Negative. Fact Sheet for Patients:  PinkCheek.be Fact Sheet for Healthcare Providers:  GravelBags.it This test is not yet ap proved or cleared by the [place] FDA and  has been authorized for detection and/or diagnosis of SARS-CoV-2 by FDA under  an Emergency Use Authorization (EUA). This EUA will remain  in effect (meaning this test can be used) for the duration of the COVID-19 declaration under Section 564(b)(1) of the Act, 21 U.S.C. section 360bbb-3(b)(1), unless the authorization is terminated or revoked sooner.    Influenza A by PCR NEGATIVE NEGATIVE Final   Influenza B by PCR NEGATIVE NEGATIVE Final    Comment: (NOTE) The Xpert Xpress SARS-CoV-2/FLU/RSV assay is intended as an aid in  the diagnosis of influenza from Nasopharyngeal swab specimens and  should not be used as a sole basis for treatment. Nasal washings and  aspirates are unacceptable for Xpert Xpress SARS-CoV-2/FLU/RSV  testing. Fact Sheet for Patients: PinkCheek.be Fact Sheet for Healthcare Providers: GravelBags.it This test is not yet approved or cleared by the [place] FDA and  has been authorized for detection and/or diagnosis of SARS-CoV-2 by  FDA under an Emergency Use Authorization (EUA). This EUA will remain  in effect (meaning this test can be used) for the duration of the  Covid-19 declaration under Section 564(b)(1) of the Act, 21  U.S.C. section 360bbb-3(b)(1), unless the authorization is  terminated or revoked. Performed at [facility], [place] [address], [place], [place] [address]      Radiological Exams on Admission: CT ABDOMEN PELVIS WO CONTRAST  Result Date: 03/19/2020 CLINICAL DATA:  Generalized abdominal pain EXAM: CT ABDOMEN AND PELVIS WITHOUT CONTRAST TECHNIQUE: Multidetector CT imaging of the abdomen and pelvis was performed following the standard protocol without IV contrast. COMPARISON:  None. FINDINGS: Lower chest: Prosthetic aortic valve is seen. No pericardial fluid/thickening. No hiatal hernia. The visualized portions of the lungs are clear. Hepatobiliary: Although limited due to the lack of intravenous contrast, normal in appearance without gross focal abnormality.  Scattered punctate calcifications are seen. No evidence of calcified gallstones or biliary ductal dilatation. Pancreas:  Unremarkable.  No surrounding inflammatory changes. Spleen: Normal in size. Although limited due to the lack of intravenous contrast, normal in appearance. Adrenals/Urinary Tract: Both adrenal glands appear normal. Mild bilateral perinephric stranding is seen, likely due to chronic medical renal disease. No renal or collecting system calculi. No hydronephrosis. Bladder is unremarkable. Stomach/Bowel: The stomach and small bowel are normal in appearance.  There is a moderate amount of colonic stool. Scattered colonic diverticula are noted without diverticulitis. Vascular/Lymphatic: There are no enlarged abdominal or pelvic lymph nodes. Scattered aortic atherosclerotic calcifications are seen without aneurysmal dilatation. Reproductive: The prostate is unremarkable. Other: No evidence of abdominal wall mass or hernia. Musculoskeletal: No acute or significant osseous findings. IMPRESSION: No renal or collecting system calculi. Colonic diverticulosis without diverticulitis. Moderate amount of colonic stool. Aortic Atherosclerosis (ICD10-I70.0). No other acute intra-abdominal or pelvic pathology to explain the patient's symptoms. Electronically Signed   By: Prudencio Pair M.D.   On: 03/19/2020 23:14   CT Cervical Spine Wo Contrast  Result Date: 03/19/2020 CLINICAL DATA:  Recent cervical surgery with neck pain EXAM: CT CERVICAL SPINE WITHOUT CONTRAST TECHNIQUE: Multidetector CT imaging of the cervical spine was performed without intravenous contrast. Multiplanar CT image reconstructions were also generated. COMPARISON:  CT 12/27/2019 FINDINGS: Alignment: Mild straightening. No subluxation. Facet alignment is maintained. Skull base and vertebrae: No acute fracture. No primary bone lesion or focal pathologic process. Soft tissues and spinal canal: No prevertebral fluid or swelling. No visible canal  hematoma. Disc levels: Interval removal of fixating plate and screws from C4 through C6 with hardware tracks present in C5 and C6 vertebral bodies. Intervertebral disc devices are present at C4-C5 and C5-C6. Interval placement of anterior plate and fixating screws in addition to interbody device at C3-C4. There appears to be resection of the posteroinferior aspect of C3 and superior posterior aspect of C4. Cut margins appear smooth. Degenerative changes at C6-C7 and C7-T1. Upper chest: Carotid vascular calcifications. Lung apices are clear. Other: No gross retropharyngeal gas or fluid collection. IMPRESSION: 1. Interval removal of fixating plate and screws from C4 through C6 with interval placement of surgical plate, fixating screws and interbody device at C3-C4. No acute osseous abnormality. Electronically Signed   By: Donavan Foil M.D.   On: 03/19/2020 23:19   US Abdomen Limited  Result Date: 03/20/2020 CLINICAL DATA:  Right upper quadrant pain EXAM: ULTRASOUND ABDOMEN LIMITED RIGHT UPPER QUADRANT COMPARISON:  None. FINDINGS: Gallbladder: Partially decompressed gallbladder. No gallstones or wall thickening visualized. No sonographic Murphy sign noted by sonographer. Common bile duct: Diameter: 6 mm Liver: Increased echotexture seen throughout. No focal abnormality or biliary ductal dilatation. Portal vein is patent on color Doppler imaging with normal direction of blood flow towards the liver. Other: None. IMPRESSION: Hepatic steatosis. Partially decompressed gallbladder otherwise unremarkable. Electronically Signed   By: Prudencio Pair M.D.   On: 03/20/2020 00:37    EKG: Independently reviewed.  Normal sinus rhythm with nonspecific ST changes.  Assessment/Plan Principal Problem:   ARF (acute renal failure) (HCC) Active Problems:   Essential hypertension   PAF (paroxysmal atrial fibrillation) (HCC)   Abnormal LFTs (liver function tests)   Cervical myelopathy (HCC)   S/P aortic valve replacement      1. Acute renal failure -suspect most likely prerenal however since patient does have RBCs and protein should rule out glomerulonephritis for which we will need nephrology consult.  For now we will hold off patient's Lasix and ACE inhibitor and continue with hydration check FENa CK levels.  Scans of the abdomen does not show any obstruction.  Follow intake output metabolic panel.  Further recommendations awaited from nephrology. 2. Markedly elevated LFTs scant of the abdomen does not show any acute.  Abdomen appears benign.  Could be from shock liver.  Patient denies taking any Tylenol.  Will check acute hepatitis panel Tylenol levels INR and hold amiodarone for  now.  Continue with hydration.  If LFTs does not improve may consider MRCP. 3. History of A. fib we will hold amiodarone due to elevated LFTs.  Will hold Eliquis for now due to acute renal failure and keep patient on heparin in case patient needs biopsies. 4. Back pain and neck pain from recent surgery.  Scans do not show anything acute.  If pain persist may have to consult Dr. Trenton Gammon patient's neurosurgeon. 5. Anemia follow CBC. 6. Low albumin level could be from severe protein calorie malnutrition.  Will need nutrition input. 7. Status post we will proceed aortic valve replacement. 8. Hypothyroidism on Synthroid we will check TSH levels.  Since patient has acute renal failure with elevated LFTs will need close monitoring for any further deterioration in inpatient status.   DVT prophylaxis: Heparin and holding Eliquis due to high degree LFTs and may need for any procedure. Code Status: Full code. Family Communication: Discussed with patient. Disposition Plan: Home. Consults called: None. Admission status: Inpatient.   Rise Patience MD [facility] Pager [phone].  If 7PM-7AM, please contact night-coverage [email or web address] Password [facility]  03/20/2020, 3:34 AM

## 2020-03-20 NOTE — Consult Note (Signed)
NAME:  Jiren Crute, MRN:  [id number], DOB:  1951/08/22, LOS: 0 ADMISSION DATE:  03/19/2020, CONSULTATION DATE:  4/30 REFERRING MD:  Horris Latino, CHIEF COMPLAINT:  Hypotension    Brief History   69 year old male patient who came in with chief complaint of generalized weakness, fatigue, decreased p.o. intake found to be in acute on chronic renal failure , Additionally hypotensive with normal lactic acid.  Pulmonary asked to see for hypotension in spite of 3 L crystalloid resuscitation  History of present illness   This is a 69 year old male patient who presents to the emergency room on 4/29 with chief complaint of fatigue and weakness.  Apparently he had recently undergone cervical spine surgery on 99991111 without complication.  However after being discharged to home did experience some significant neck and back pain as well as decreased appetite.  He was seen by his primary care provider on 4/24 at which time he was noted to have significant worsening in his renal function.  He was instructed to hold his lisinopril, and possibly make some changes to his diuretic regimen.  He had not been taking his medications for at least 2 days prior to presentation to the emergency room overall feeling generally unwell.  He eventually felt so weak he could not get himself off the couch, because of this emergency services were summoned, he was brought to the emergency room via ambulance.  In the emergency room he was found to have mild hematuria with proteinuria, worsening acute on chronic renal failure, and mild hypotension with systolic blood pressures in the 80s to 90s in spite of 3 L crystalloid resuscitation. -> Anion gap 19, BUN 106, creatinine 6.53,, Lactic acid 1.6.  pulmonary has been asked to see for hypotension.  Past Medical History  CKD stage IIIa, coronary artery disease, hypertension, hyperlipidemia, aortic stenosis. Paroxysmal atrial fibrillation, prior bioprosthetic aortic valve  replacement Significant Hospital Events   4/30: Admitted  Consults:  Nephrology consulted for acute on chronic renal failure Pulmonary/critical care consulted  Procedures:    Significant Diagnostic Tests:    Micro Data:   Antimicrobials:    Interim history/subjective:    Objective   Blood pressure 110/66, pulse 71, temperature 98.1 F (36.7 C), temperature source Oral, resp. rate 17, height 5\' 10"  (1.778 m), weight 86.2 kg, SpO2 100 %.        Intake/Output Summary (Last 24 hours) at 03/20/2020 1204 Last data filed at 03/20/2020 1145 Gross per 24 hour  Intake 4107.45 ml  Output 200 ml  Net 3907.45 ml   Filed Weights   03/20/20 0500  Weight: 86.2 kg    Examination: General: 69 year old white male currently resting in bed he is in no acute distress currently HENT: Normocephalic atraumatic mucous membranes moist no JVD Lungs: Currently clear to auscultation on room air Cardiovascular: Systolic murmur noted Abdomen: Soft nontender Extremities: Warm dry Neuro: Awake oriented GU: Voiding concentrated urine  Resolved Hospital Problem list     Assessment & Plan:  Acute on chronic renal failure with hematuria with baseline serum creatinine of 1.3-1.5 (CKD stage IIIa) Seen by nephrology, suspect multifactorial: Volume depletion, complicated by ACE inhibitor, Lasix, and NSAIDs Plan Continuing gentle hydration Follow-up chemistries Strict intake output Additional diagnostic work-up per nephrology  Hypotension.  Presumptively volume depletion.Currently with normal lactic acid suggesting intact end-organ perfusion. He has had recent c-spine surgery. I wonder what his steroid exposure has been. He seems to be unclear of if and when he received these.  Plan Continue IV  hydration Check serum cortisol Agree midodrine reasonable, will start Continue to monitor Will repeat echo  Anion gap metabolic acidosis in setting of AKI (nml LA) Plan Cont supportive  care  History of PAF, prior bioprosthetic aortic valve replacement Plan Continue telemetry monitoring Primary service is holding amiodarone given elevated LFTs Holding DOAC given acute renal failure and possible need for biopsy  History of hypothyroidism Plan Continue Synthroid and follow-up TSH  Anemia of chronic disease Plan Monitor  Elevated LFTs Plan Follow-up LFTs in a.m. with hydration  Neck pain Plan Per primary   Best practice:  Diet: reg Pain/Anxiety/Delirium protocol (if indicated): na VAP protocol (if indicated): na DVT prophylaxis: IV heparin GI prophylaxis: PPI Glucose control: na Mobility: w assist Code Status: full code  Family Communication: updated  Disposition: we will be available as needed  Labs   CBC: Recent Labs  Lab 03/19/20 1801 03/20/20 0331  WBC 12.6* 13.1*  NEUTROABS 10.3* 10.5*  HGB 10.4* 8.5*  HCT 31.2* 24.8*  MCV 89.7 87.0  PLT [id number]    Basic Metabolic Panel: Recent Labs  Lab 03/19/20 1801 03/20/20 0331  NA 130* 133*  K 4.1 4.2  CL 94* 98  CO2 19* 16*  GLUCOSE 134* 71  BUN 98* 106*  CREATININE 6.40* 6.53*  CALCIUM 10.2 9.2   GFR: Estimated Creatinine Clearance: 11.3 mL/min (A) (by C-G formula based on SCr of 6.53 mg/dL (H)). Recent Labs  Lab 03/19/20 1801 03/20/20 0331  WBC 12.6* 13.1*  LATICACIDVEN 1.6  --     Liver Function Tests: Recent Labs  Lab 03/19/20 1801 03/20/20 0331  AST 415* 227*  ALT 429* 302*  ALKPHOS 158* 127*  BILITOT 5.5* 4.8*  PROT 7.8 6.5  ALBUMIN 2.6* 2.1*   No results for input(s): LIPASE, AMYLASE in the last 168 hours. No results for input(s): AMMONIA in the last 168 hours.  ABG No results found for: PHART, PCO2ART, PO2ART, HCO3, TCO2, ACIDBASEDEF, O2SAT   Coagulation Profile: Recent Labs  Lab 03/20/20 0331  INR 1.8*    Cardiac Enzymes: Recent Labs  Lab 03/19/20 1801 03/20/20 0331  CKTOTAL 20* 54    HbA1C: Hgb A1c MFr Bld  Date/Time Value Ref Range Status   02/24/2020 12:22 PM 6.8 (H) 4.8 - 5.6 % Final    Comment:    (NOTE) Pre diabetes:          5.7%-6.4% Diabetes:              >6.4% Glycemic control for   <7.0% adults with diabetes   04/18/2019 11:42 AM 6.0 (H) 4.8 - 5.6 % Final    Comment:    (NOTE) Pre diabetes:          5.7%-6.4% Diabetes:              >6.4% Glycemic control for   <7.0% adults with diabetes     CBG: Recent Labs  Lab 03/20/20 0709 03/20/20 0726 03/20/20 0809 03/20/20 1147  GLUCAP 58* 60* 94 88    Review of Systems:   Review of Systems  Constitutional: Positive for malaise/fatigue. Negative for chills and fever.  HENT: Negative.   Eyes: Negative.   Respiratory: Negative.   Cardiovascular: Negative for chest pain and leg swelling.  Gastrointestinal: Negative.   Genitourinary: Negative.   Musculoskeletal: Positive for back pain, myalgias and neck pain.  Skin: Negative.   Neurological: Positive for tingling, sensory change and weakness.  Endo/Heme/Allergies: Negative.   Psychiatric/Behavioral: Negative.  Past Medical History  He,  has a past medical history of Abnormal LFTs (liver function tests) (10/31/2016), Abnormal nuclear stress test (05/03/2018), Acid reflux (02/15/2018), Acute otitis externa of left ear (11/13/2019), Acute suppurative otitis media of left ear without spontaneous rupture of tympanic membrane (11/13/2019), Anemia, Aortic stenosis (05/03/2018), Arthritis (02/15/2018), Cancer ([place]), Cervical myelopathy ([place]) (04/22/2019), Conductive hearing loss of left ear (11/13/2019), Coronary artery disease involving native coronary artery of native heart without angina pectoris (05/30/2016), Coronary artery disease of native artery of native heart with stable angina pectoris ([place]), Diabetes mellitus due to underlying condition with unspecified complications ([place]) (A999333), Dyslipidemia (05/30/2016), Dyspnea, Dysrhythmia, Essential hypertension (05/30/2016), Heart murmur, Hyperactive gag reflex  (02/15/2018), Hypothyroidism, Intermittent claudication ([place]) (10/31/2018), Lumbar stenosis with neurogenic claudication (06/11/2019), Macular degeneration (02/15/2018), PAF (paroxysmal atrial fibrillation) ([place]) (05/30/2016), Pre-operative cardiovascular examination (01/03/2020), Preoperative cardiovascular examination (04/17/2019), Renal insufficiency (01/17/2020), S/P aortic valve replacement (01/17/2020), and Tinnitus of left ear (11/13/2019).   Surgical History    Past Surgical History:  Procedure Laterality Date  . ANTERIOR CERVICAL DECOMP/DISCECTOMY FUSION N/A 04/22/2019   Procedure: Anterior Cervical Discectomy and Fusion, Cervical four-five, Cervical five-six;  Surgeon: Earnie Larsson, MD;  Location: [place];  Service: Neurosurgery;  Laterality: N/A;  . ANTERIOR CERVICAL DECOMP/DISCECTOMY FUSION N/A 03/02/2020   Procedure: Anterior Cervical Decompression Fusion  - Cervical three-Cervical four;  Surgeon: Earnie Larsson, MD;  Location: [place];  Service: Neurosurgery;  Laterality: N/A;  . AORTIC VALVE REPLACEMENT    . CARDIAC CATHETERIZATION     several stents  . CARDIAC SURGERY    . CORONARY ARTERY BYPASS GRAFT  2017   patient unsure how many, had at [facility]  . HAND SURGERY    . KNEE SURGERY    . LEFT HEART CATH AND CORONARY ANGIOGRAPHY N/A 01/21/2020   Procedure: LEFT HEART CATH AND CORONARY ANGIOGRAPHY;  Surgeon: Belva Crome, MD;  Location: [place] [facility];  Service: Cardiovascular;  Laterality: N/A;  . LEFT HEART CATH AND CORS/GRAFTS ANGIOGRAPHY N/A 05/16/2018   Procedure: LEFT HEART CATH AND CORS/GRAFTS ANGIOGRAPHY;  Surgeon: Burnell Blanks, MD;  Location: [place] [facility];  Service: Cardiovascular;  Laterality: N/A;  . LUMBAR LAMINECTOMY/DECOMPRESSION MICRODISCECTOMY Bilateral 06/11/2019   Procedure: Bilateral Lumbar Four-FiveLaminectomy/Foraminotomy;  Surgeon: Earnie Larsson, MD;  Location: [place];  Service: Neurosurgery;  Laterality: Bilateral;  posterior     Social  History   reports that he has never smoked. He has never used smokeless tobacco. He reports previous alcohol use. He reports that he does not use drugs.   Family History   His family history includes Brain cancer in his mother; Heart disease in his maternal grandmother; Prostate cancer in his brother.   Allergies Allergies  Allergen Reactions  . Atorvastatin Other (See Comments)    Urinary retention   . Nebivolol Diarrhea     Home Medications  Prior to Admission medications   Medication Sig Start Date End Date Taking? Authorizing Provider  amiodarone (PACERONE) 400 MG tablet Take 1 tablet (400 mg total) by mouth daily. Take 400 mg for 2 weeks then decrease to 200 mg daily. Patient taking differently: Take 200 mg by mouth at bedtime.  02/04/20  Yes Revankar, Reita Cliche, MD  amLODipine (NORVASC) 10 MG tablet Take 10 mg by mouth at bedtime.  04/14/17  Yes [provider]  apixaban (ELIQUIS) 5 MG TABS tablet Take 1 tablet (5 mg total) by mouth 2 (two) times daily. 02/04/20  Yes Revankar, Reita Cliche, MD  benazepril (LOTENSIN) 40 MG tablet Take 40 mg by mouth at bedtime.  09/23/17  Yes [provider]  Coenzyme Q10 (COQ10) 200 MG CAPS Take 200 mg by mouth daily.    Yes [provider]  ezetimibe (ZETIA) 10 MG tablet Take 1 tablet (10 mg total) by mouth at bedtime. 02/04/20  Yes Revankar, Reita Cliche, MD  furosemide (LASIX) 40 MG tablet Take 40 mg by mouth daily. 10/10/17  Yes [provider]  levothyroxine (SYNTHROID) 25 MCG tablet Take 25 mcg by mouth daily before breakfast.  01/23/20  Yes [provider]  metFORMIN (GLUCOPHAGE) 500 MG tablet Take 500 mg by mouth at bedtime.  12/28/18  Yes [provider]  metoprolol tartrate (LOPRESSOR) 50 MG tablet Take 1 tablet (50 mg total) by mouth daily as needed. Patient taking differently: Take 50 mg by mouth 3 (three) times daily as needed (afib).  01/03/20 04/02/20 Yes Revankar, Reita Cliche, MD  nitroGLYCERIN (NITROSTAT)  0.4 MG SL tablet Place 1 tablet (0.4 mg total) under the tongue every 5 (five) minutes as needed for chest pain. 01/13/20  Yes Revankar, Reita Cliche, MD  vitamin B-12 (CYANOCOBALAMIN) 1000 MCG tablet Take 1,000 mcg by mouth daily.   Yes [provider]  vitamin C (ASCORBIC ACID) 500 MG tablet Take 500 mg by mouth daily.    Yes [provider]  zinc gluconate 50 MG tablet Take 50 mg by mouth daily.   Yes [provider]  cyclobenzaprine (FLEXERIL) 10 MG tablet Take 1 tablet (10 mg total) by mouth at bedtime. Patient not taking: Reported on 01/20/2020 03/06/19   Alda Berthold, DO     Critical care time: NA     Erick Colace ACNP-BC [place] Pager # [phone] OR # [phone] if no answer

## 2020-03-21 ENCOUNTER — Inpatient Hospital Stay (HOSPITAL_COMMUNITY): Payer: BC Managed Care – PPO

## 2020-03-21 ENCOUNTER — Encounter (HOSPITAL_COMMUNITY): Payer: Self-pay | Admitting: Internal Medicine

## 2020-03-21 DIAGNOSIS — D62 Acute posthemorrhagic anemia: Secondary | ICD-10-CM | POA: Diagnosis not present

## 2020-03-21 DIAGNOSIS — E1165 Type 2 diabetes mellitus with hyperglycemia: Secondary | ICD-10-CM | POA: Diagnosis not present

## 2020-03-21 DIAGNOSIS — H3530 Unspecified macular degeneration: Secondary | ICD-10-CM | POA: Diagnosis present

## 2020-03-21 DIAGNOSIS — E1169 Type 2 diabetes mellitus with other specified complication: Secondary | ICD-10-CM | POA: Diagnosis present

## 2020-03-21 DIAGNOSIS — G959 Disease of spinal cord, unspecified: Secondary | ICD-10-CM | POA: Diagnosis not present

## 2020-03-21 DIAGNOSIS — I4891 Unspecified atrial fibrillation: Secondary | ICD-10-CM | POA: Diagnosis not present

## 2020-03-21 DIAGNOSIS — A4181 Sepsis due to Enterococcus: Secondary | ICD-10-CM | POA: Diagnosis not present

## 2020-03-21 DIAGNOSIS — Z952 Presence of prosthetic heart valve: Secondary | ICD-10-CM | POA: Diagnosis not present

## 2020-03-21 DIAGNOSIS — M25462 Effusion, left knee: Secondary | ICD-10-CM | POA: Diagnosis not present

## 2020-03-21 DIAGNOSIS — G8929 Other chronic pain: Secondary | ICD-10-CM | POA: Diagnosis present

## 2020-03-21 DIAGNOSIS — B952 Enterococcus as the cause of diseases classified elsewhere: Secondary | ICD-10-CM | POA: Diagnosis not present

## 2020-03-21 DIAGNOSIS — Z954 Presence of other heart-valve replacement: Secondary | ICD-10-CM | POA: Diagnosis not present

## 2020-03-21 DIAGNOSIS — M00261 Other streptococcal arthritis, right knee: Secondary | ICD-10-CM | POA: Diagnosis not present

## 2020-03-21 DIAGNOSIS — M4640 Discitis, unspecified, site unspecified: Secondary | ICD-10-CM | POA: Diagnosis not present

## 2020-03-21 DIAGNOSIS — E785 Hyperlipidemia, unspecified: Secondary | ICD-10-CM | POA: Diagnosis present

## 2020-03-21 DIAGNOSIS — M00862 Arthritis due to other bacteria, left knee: Secondary | ICD-10-CM | POA: Diagnosis not present

## 2020-03-21 DIAGNOSIS — N171 Acute kidney failure with acute cortical necrosis: Secondary | ICD-10-CM | POA: Diagnosis not present

## 2020-03-21 DIAGNOSIS — I330 Acute and subacute infective endocarditis: Secondary | ICD-10-CM | POA: Diagnosis not present

## 2020-03-21 DIAGNOSIS — I10 Essential (primary) hypertension: Secondary | ICD-10-CM | POA: Diagnosis not present

## 2020-03-21 DIAGNOSIS — I4819 Other persistent atrial fibrillation: Secondary | ICD-10-CM | POA: Diagnosis not present

## 2020-03-21 DIAGNOSIS — M5441 Lumbago with sciatica, right side: Secondary | ICD-10-CM

## 2020-03-21 DIAGNOSIS — Z951 Presence of aortocoronary bypass graft: Secondary | ICD-10-CM | POA: Diagnosis not present

## 2020-03-21 DIAGNOSIS — S83282A Other tear of lateral meniscus, current injury, left knee, initial encounter: Secondary | ICD-10-CM | POA: Diagnosis not present

## 2020-03-21 DIAGNOSIS — M4626 Osteomyelitis of vertebra, lumbar region: Secondary | ICD-10-CM | POA: Diagnosis not present

## 2020-03-21 DIAGNOSIS — I482 Chronic atrial fibrillation, unspecified: Secondary | ICD-10-CM | POA: Diagnosis not present

## 2020-03-21 DIAGNOSIS — I350 Nonrheumatic aortic (valve) stenosis: Secondary | ICD-10-CM | POA: Diagnosis not present

## 2020-03-21 DIAGNOSIS — T826XXA Infection and inflammatory reaction due to cardiac valve prosthesis, initial encounter: Secondary | ICD-10-CM | POA: Diagnosis not present

## 2020-03-21 DIAGNOSIS — M4647 Discitis, unspecified, lumbosacral region: Secondary | ICD-10-CM | POA: Diagnosis not present

## 2020-03-21 DIAGNOSIS — R7881 Bacteremia: Secondary | ICD-10-CM

## 2020-03-21 DIAGNOSIS — N170 Acute kidney failure with tubular necrosis: Secondary | ICD-10-CM | POA: Diagnosis not present

## 2020-03-21 DIAGNOSIS — I340 Nonrheumatic mitral (valve) insufficiency: Secondary | ICD-10-CM | POA: Diagnosis not present

## 2020-03-21 DIAGNOSIS — N183 Chronic kidney disease, stage 3 unspecified: Secondary | ICD-10-CM | POA: Diagnosis not present

## 2020-03-21 DIAGNOSIS — M11262 Other chondrocalcinosis, left knee: Secondary | ICD-10-CM

## 2020-03-21 DIAGNOSIS — Y831 Surgical operation with implant of artificial internal device as the cause of abnormal reaction of the patient, or of later complication, without mention of misadventure at the time of the procedure: Secondary | ICD-10-CM | POA: Diagnosis present

## 2020-03-21 DIAGNOSIS — E1129 Type 2 diabetes mellitus with other diabetic kidney complication: Secondary | ICD-10-CM | POA: Diagnosis not present

## 2020-03-21 DIAGNOSIS — I25810 Atherosclerosis of coronary artery bypass graft(s) without angina pectoris: Secondary | ICD-10-CM | POA: Diagnosis not present

## 2020-03-21 DIAGNOSIS — I339 Acute and subacute endocarditis, unspecified: Secondary | ICD-10-CM | POA: Diagnosis not present

## 2020-03-21 DIAGNOSIS — I483 Typical atrial flutter: Secondary | ICD-10-CM | POA: Diagnosis not present

## 2020-03-21 DIAGNOSIS — M00861 Arthritis due to other bacteria, right knee: Secondary | ICD-10-CM | POA: Diagnosis not present

## 2020-03-21 DIAGNOSIS — Z01810 Encounter for preprocedural cardiovascular examination: Secondary | ICD-10-CM | POA: Diagnosis not present

## 2020-03-21 DIAGNOSIS — I361 Nonrheumatic tricuspid (valve) insufficiency: Secondary | ICD-10-CM | POA: Diagnosis not present

## 2020-03-21 DIAGNOSIS — I2510 Atherosclerotic heart disease of native coronary artery without angina pectoris: Secondary | ICD-10-CM | POA: Diagnosis not present

## 2020-03-21 DIAGNOSIS — I959 Hypotension, unspecified: Secondary | ICD-10-CM | POA: Diagnosis not present

## 2020-03-21 DIAGNOSIS — I4892 Unspecified atrial flutter: Secondary | ICD-10-CM | POA: Diagnosis not present

## 2020-03-21 DIAGNOSIS — E1151 Type 2 diabetes mellitus with diabetic peripheral angiopathy without gangrene: Secondary | ICD-10-CM | POA: Diagnosis not present

## 2020-03-21 DIAGNOSIS — I38 Endocarditis, valve unspecified: Secondary | ICD-10-CM | POA: Diagnosis not present

## 2020-03-21 DIAGNOSIS — D631 Anemia in chronic kidney disease: Secondary | ICD-10-CM | POA: Diagnosis present

## 2020-03-21 DIAGNOSIS — E1122 Type 2 diabetes mellitus with diabetic chronic kidney disease: Secondary | ICD-10-CM | POA: Diagnosis not present

## 2020-03-21 DIAGNOSIS — E039 Hypothyroidism, unspecified: Secondary | ICD-10-CM | POA: Diagnosis present

## 2020-03-21 DIAGNOSIS — M4646 Discitis, unspecified, lumbar region: Secondary | ICD-10-CM | POA: Diagnosis not present

## 2020-03-21 DIAGNOSIS — N179 Acute kidney failure, unspecified: Secondary | ICD-10-CM | POA: Diagnosis not present

## 2020-03-21 DIAGNOSIS — I480 Paroxysmal atrial fibrillation: Secondary | ICD-10-CM | POA: Diagnosis not present

## 2020-03-21 DIAGNOSIS — S83281A Other tear of lateral meniscus, current injury, right knee, initial encounter: Secondary | ICD-10-CM | POA: Diagnosis not present

## 2020-03-21 DIAGNOSIS — S83241A Other tear of medial meniscus, current injury, right knee, initial encounter: Secondary | ICD-10-CM | POA: Diagnosis not present

## 2020-03-21 DIAGNOSIS — M5442 Lumbago with sciatica, left side: Secondary | ICD-10-CM

## 2020-03-21 DIAGNOSIS — M11261 Other chondrocalcinosis, right knee: Secondary | ICD-10-CM | POA: Diagnosis not present

## 2020-03-21 DIAGNOSIS — I129 Hypertensive chronic kidney disease with stage 1 through stage 4 chronic kidney disease, or unspecified chronic kidney disease: Secondary | ICD-10-CM | POA: Diagnosis not present

## 2020-03-21 DIAGNOSIS — M009 Pyogenic arthritis, unspecified: Secondary | ICD-10-CM | POA: Diagnosis not present

## 2020-03-21 DIAGNOSIS — G4700 Insomnia, unspecified: Secondary | ICD-10-CM | POA: Diagnosis not present

## 2020-03-21 DIAGNOSIS — S83242A Other tear of medial meniscus, current injury, left knee, initial encounter: Secondary | ICD-10-CM | POA: Diagnosis not present

## 2020-03-21 DIAGNOSIS — R5381 Other malaise: Secondary | ICD-10-CM | POA: Diagnosis not present

## 2020-03-21 DIAGNOSIS — R945 Abnormal results of liver function studies: Secondary | ICD-10-CM | POA: Diagnosis not present

## 2020-03-21 HISTORY — DX: Enterococcus as the cause of diseases classified elsewhere: R78.81

## 2020-03-21 HISTORY — DX: Other chondrocalcinosis, left knee: M11.262

## 2020-03-21 HISTORY — DX: Enterococcus as the cause of diseases classified elsewhere: B95.2

## 2020-03-21 LAB — BLOOD CULTURE ID PANEL (REFLEXED)

## 2020-03-21 LAB — CBC WITH DIFFERENTIAL/PLATELET
Abs Immature Granulocytes: 0.49 10*3/uL — ABNORMAL HIGH (ref 0.00–0.07)
Basophils Absolute: 0.1 10*3/uL (ref 0.0–0.1)
Basophils Relative: 1 %
Eosinophils Absolute: 0 10*3/uL (ref 0.0–0.5)
Eosinophils Relative: 0 %
HCT: 23.5 % — ABNORMAL LOW (ref 39.0–52.0)
Hemoglobin: 8.2 g/dL — ABNORMAL LOW (ref 13.0–17.0)
Immature Granulocytes: 5 %
Lymphocytes Relative: 12 %
Lymphs Abs: 1.1 10*3/uL (ref 0.7–4.0)
MCH: 30.4 pg (ref 26.0–34.0)
MCHC: 34.9 g/dL (ref 30.0–36.0)
MCV: 87 fL (ref 80.0–100.0)
Monocytes Absolute: 0.9 10*3/uL (ref 0.1–1.0)
Monocytes Relative: 10 %
Neutro Abs: 6.9 10*3/uL (ref 1.7–7.7)
Neutrophils Relative %: 72 %
Platelets: 305 10*3/uL (ref 150–400)
RBC: 2.7 MIL/uL — ABNORMAL LOW (ref 4.22–5.81)
RDW: 14.6 % (ref 11.5–15.5)
WBC: 9.4 10*3/uL (ref 4.0–10.5)
nRBC: 0.2 % (ref 0.0–0.2)

## 2020-03-21 LAB — COMPREHENSIVE METABOLIC PANEL
ALT: 564 U/L — ABNORMAL HIGH (ref 0–44)
AST: 648 U/L — ABNORMAL HIGH (ref 15–41)
Albumin: 1.9 g/dL — ABNORMAL LOW (ref 3.5–5.0)
Alkaline Phosphatase: 153 U/L — ABNORMAL HIGH (ref 38–126)
Anion gap: 12 (ref 5–15)
BUN: 78 mg/dL — ABNORMAL HIGH (ref 8–23)
CO2: 20 mmol/L — ABNORMAL LOW (ref 22–32)
Calcium: 8.8 mg/dL — ABNORMAL LOW (ref 8.9–10.3)
Chloride: 99 mmol/L (ref 98–111)
Creatinine, Ser: 4.26 mg/dL — ABNORMAL HIGH (ref 0.61–1.24)
GFR calc Af Amer: 16 mL/min — ABNORMAL LOW (ref >60)
GFR calc non Af Amer: 13 mL/min — ABNORMAL LOW (ref >60)
Glucose, Bld: 80 mg/dL (ref 70–99)
Potassium: 3.9 mmol/L (ref 3.5–5.1)
Sodium: 131 mmol/L — ABNORMAL LOW (ref 135–145)
Total Bilirubin: 5.3 mg/dL — ABNORMAL HIGH (ref 0.3–1.2)
Total Protein: 6.1 g/dL — ABNORMAL LOW (ref 6.5–8.1)

## 2020-03-21 LAB — PROCALCITONIN: Procalcitonin: 0.53 ng/mL

## 2020-03-21 LAB — GLUCOSE, CAPILLARY
Glucose-Capillary: 62 mg/dL — ABNORMAL LOW (ref 70–99)
Glucose-Capillary: 84 mg/dL (ref 70–99)
Glucose-Capillary: 78 mg/dL (ref 70–99)
Glucose-Capillary: 57 mg/dL — ABNORMAL LOW (ref 70–99)
Glucose-Capillary: 86 mg/dL (ref 70–99)
Glucose-Capillary: 69 mg/dL — ABNORMAL LOW (ref 70–99)
Glucose-Capillary: 62 mg/dL — ABNORMAL LOW (ref 70–99)
Glucose-Capillary: 96 mg/dL (ref 70–99)
Glucose-Capillary: 107 mg/dL — ABNORMAL HIGH (ref 70–99)

## 2020-03-21 LAB — SYNOVIAL CELL COUNT + DIFF, W/ CRYSTALS
Eosinophils-Synovial: 0 % (ref 0–1)
Lymphocytes-Synovial Fld: 0 % (ref 0–20)
Monocyte-Macrophage-Synovial Fluid: 5 % — ABNORMAL LOW (ref 50–90)
Neutrophil, Synovial: 95 % — ABNORMAL HIGH (ref 0–25)
WBC, Synovial: 15800 /mm3 — ABNORMAL HIGH (ref 0–200)

## 2020-03-21 LAB — APTT
aPTT: 53 seconds — ABNORMAL HIGH (ref 24–36)
aPTT: 76 seconds — ABNORMAL HIGH (ref 24–36)

## 2020-03-21 LAB — ANA: Anti Nuclear Antibody (ANA): NEGATIVE

## 2020-03-21 LAB — C4 COMPLEMENT: Complement C4, Body Fluid: 28 mg/dL (ref 12–38)

## 2020-03-21 LAB — C3 COMPLEMENT: C3 Complement: 167 mg/dL (ref 82–167)

## 2020-03-21 LAB — HEPARIN LEVEL (UNFRACTIONATED): Heparin Unfractionated: >2.2 IU/mL — ABNORMAL HIGH (ref 0.30–0.70)

## 2020-03-21 MED ORDER — POLYSACCHARIDE IRON COMPLEX 150 MG PO CAPS
150.0000 mg | ORAL_CAPSULE | Freq: Every day | ORAL | Status: DC
  Administered 2020-03-21 – 2020-04-07 (×17): 150 mg via ORAL
  Filled 2020-03-21 (×17): qty 1

## 2020-03-21 MED ORDER — MORPHINE SULFATE (PF) 2 MG/ML IV SOLN
2.0000 mg | INTRAVENOUS | Status: DC | PRN
  Administered 2020-03-22 – 2020-04-02 (×13): 2 mg via INTRAVENOUS
  Filled 2020-03-21 (×13): qty 1

## 2020-03-21 MED ORDER — SODIUM CHLORIDE 0.9 % IV SOLN: INTRAVENOUS | Status: AC

## 2020-03-21 MED ORDER — GLUCOSE 40 % PO GEL
ORAL | Status: AC
  Filled 2020-03-21: qty 1

## 2020-03-21 MED ORDER — SODIUM CHLORIDE 0.9 % IV SOLN
2.0000 g | Freq: Three times a day (TID) | INTRAVENOUS | Status: DC
  Administered 2020-03-21 – 2020-03-22 (×4): 2 g via INTRAVENOUS
  Filled 2020-03-21: qty 2000
  Filled 2020-03-21 (×2): qty 2
  Filled 2020-03-21: qty 2000
  Filled 2020-03-21 (×2): qty 2

## 2020-03-21 NOTE — Progress Notes (Signed)
PHARMACY - PHYSICIAN COMMUNICATION CRITICAL VALUE ALERT - BLOOD CULTURE IDENTIFICATION (BCID)  Johnathan Murray is an 69 y.o. male who presented to [facility] on 03/19/2020 with a chief complaint of pain the neck/low back. Recent spinal surgery.  Assessment:  Pt with enterococcus (no resistance detected) in 3/3 blood culture bottles  Name of physician (or Provider) Contacted: Bodenheimer, NP  Current antibiotics: Cefepime  Changes to prescribed antibiotics recommended:  Antibiotics changed to Ampicillin 2gm IV q8h (adjusted for renal dysfunction). ID will be automatically consulted.  Results for orders placed or performed during the hospital encounter of 03/19/20  Blood Culture ID Panel (Reflexed) (Collected: 03/20/2020  7:45 AM)  Result Value Ref Range   Enterococcus species DETECTED (A) NOT DETECTED   Vancomycin resistance NOT DETECTED NOT DETECTED   Listeria monocytogenes NOT DETECTED NOT DETECTED   Staphylococcus species NOT DETECTED NOT DETECTED   Staphylococcus aureus (BCID) NOT DETECTED NOT DETECTED   Streptococcus species NOT DETECTED NOT DETECTED   Streptococcus agalactiae NOT DETECTED NOT DETECTED   Streptococcus pneumoniae NOT DETECTED NOT DETECTED   Streptococcus pyogenes NOT DETECTED NOT DETECTED   Acinetobacter baumannii NOT DETECTED NOT DETECTED   Enterobacteriaceae species NOT DETECTED NOT DETECTED   Enterobacter cloacae complex NOT DETECTED NOT DETECTED   Escherichia coli NOT DETECTED NOT DETECTED   Klebsiella oxytoca NOT DETECTED NOT DETECTED   Klebsiella pneumoniae NOT DETECTED NOT DETECTED   Proteus species NOT DETECTED NOT DETECTED   Serratia marcescens NOT DETECTED NOT DETECTED   Haemophilus influenzae NOT DETECTED NOT DETECTED   Neisseria meningitidis NOT DETECTED NOT DETECTED   Pseudomonas aeruginosa NOT DETECTED NOT DETECTED   Candida albicans NOT DETECTED NOT DETECTED   Candida glabrata NOT DETECTED NOT DETECTED   Candida krusei NOT DETECTED NOT  DETECTED   Candida parapsilosis NOT DETECTED NOT DETECTED   Candida tropicalis NOT DETECTED NOT DETECTED    Sherlon Handing, PharmD, BCPS Please see amion for complete clinical pharmacist phone list 03/21/2020  1:18 AM

## 2020-03-21 NOTE — Consult Note (Addendum)
ORTHOPAEDIC CONSULTATION  REQUESTING PHYSICIAN: Alma Friendly, MD  Chief Complaint: Bilateral knee pain.   HPI: Johnathan Murray is a 69 y.o. male who complains of  Bilateral knee pain.  Patient has history of paroxysmal atrial fibrillation, hypertension, and bioprosthetic aortic valve replacement. He underwent a cervical discectomy and fusion 3 weeks ago with Dr. Trenton Gammon. He is currently being treated for sepsis, unknown etiology.  Patient was found to have tender and swollen left knee. X-rays were taken showing diffuse degenerative changes as well as a large joint effusion. X-rays stated that septic joint could not be excluded and orthopedics was consulted for a joint aspiration of his knee.  Patient states he has a many year history of bilateral knee pain and swelling, left worse than right. He had a left knee arthroscopy "where they cleaned me out" over 10 years ago. Patient has knee aspirations of his left knee a few times a year and gets cortisone injection every 3-6 months in bilateral knees. His last cortisone injection in his left knee was in January 2021 which did help his symptoms. Patient states his knees have been swollen and painful for the last 3 weeks and he has had trouble bearing weight on them. He is unable to pinpoint if he felt these symptoms before or after his recent cervical fusion. Pain today is severe but improved with rest and pain medicine.   Past Medical History:  Diagnosis Date  . Abnormal LFTs (liver function tests) 10/31/2016  . Abnormal nuclear stress test 05/03/2018  . Acid reflux 02/15/2018   not at present  . Acute otitis externa of left ear 11/13/2019  . Acute suppurative otitis media of left ear without spontaneous rupture of tympanic membrane 11/13/2019  . Anemia    low iron  . Aortic stenosis 05/03/2018  . Arthritis 02/15/2018   Overview:  shoulder  . Cancer ([place])    skin cancer on left wrist  . Cervical myelopathy ([place]) 04/22/2019  .  Conductive hearing loss of left ear 11/13/2019  . Coronary artery disease involving native coronary artery of native heart without angina pectoris 05/30/2016  . Coronary artery disease of native artery of native heart with stable angina pectoris ([place])    pt. denies  . Diabetes mellitus due to underlying condition with unspecified complications ([place]) [id number]  . Dyslipidemia 05/30/2016  . Dyspnea    upon exertion,  as of 06/05/09 not having any recent chest pain  . Dysrhythmia    a fib  . Essential hypertension 05/30/2016  . Heart murmur    Has had an Aortic valve replacement  . Hyperactive gag reflex 02/15/2018  . Hypothyroidism   . Intermittent claudication ([place]) 10/31/2018  . Lumbar stenosis with neurogenic claudication 06/11/2019  . Macular degeneration 02/15/2018  . PAF (paroxysmal atrial fibrillation) ([place]) 05/30/2016  . Pre-operative cardiovascular examination 01/03/2020  . Preoperative cardiovascular examination 04/17/2019  . Renal insufficiency 01/17/2020  . S/P aortic valve replacement 01/17/2020  . Tinnitus of left ear 11/13/2019   Past Surgical History:  Procedure Laterality Date  . ANTERIOR CERVICAL DECOMP/DISCECTOMY FUSION N/A 04/22/2019   Procedure: Anterior Cervical Discectomy and Fusion, Cervical four-five, Cervical five-six;  Surgeon: Earnie Larsson, MD;  Location: [place];  Service: Neurosurgery;  Laterality: N/A;  . ANTERIOR CERVICAL DECOMP/DISCECTOMY FUSION N/A 03/02/2020   Procedure: Anterior Cervical Decompression Fusion  - Cervical three-Cervical four;  Surgeon: Earnie Larsson, MD;  Location: [place];  Service: Neurosurgery;  Laterality: N/A;  . AORTIC VALVE REPLACEMENT    .  CARDIAC CATHETERIZATION     several stents  . CARDIAC SURGERY    . CORONARY ARTERY BYPASS GRAFT  2017   patient unsure how many, had at [facility]  . HAND SURGERY    . KNEE SURGERY    . LEFT HEART CATH AND CORONARY ANGIOGRAPHY N/A 01/21/2020   Procedure: LEFT HEART CATH AND CORONARY  ANGIOGRAPHY;  Surgeon: Belva Crome, MD;  Location: [place] [facility];  Service: Cardiovascular;  Laterality: N/A;  . LEFT HEART CATH AND CORS/GRAFTS ANGIOGRAPHY N/A 05/16/2018   Procedure: LEFT HEART CATH AND CORS/GRAFTS ANGIOGRAPHY;  Surgeon: Burnell Blanks, MD;  Location: [place] [facility];  Service: Cardiovascular;  Laterality: N/A;  . LUMBAR LAMINECTOMY/DECOMPRESSION MICRODISCECTOMY Bilateral 06/11/2019   Procedure: Bilateral Lumbar Four-FiveLaminectomy/Foraminotomy;  Surgeon: Earnie Larsson, MD;  Location: [place];  Service: Neurosurgery;  Laterality: Bilateral;  posterior   Social History   Socioeconomic History  . Marital status: Married    Spouse name: [name]  . Number of children: 1  . Years of education: Not on file  . Highest education level: GED or equivalent  Occupational History    Employer: [employer]  Tobacco Use  . Smoking status: Never Smoker  . Smokeless tobacco: Never Used  Substance and Sexual Activity  . Alcohol use: Not Currently  . Drug use: No  . Sexual activity: Not on file  Other Topics Concern  . Not on file  Social History Narrative   He drives a truck for [employer].   He lives with wife in a one-level home.   Highest level of education:  GED      Patient is right-handed. There are 8 steps to enter his home.   Social Determinants of Health   Financial Resource Strain:   . Difficulty of Paying Living Expenses:   Food Insecurity:   . Worried About Charity fundraiser in the Last Year:   . Arboriculturist in the Last Year:   Transportation Needs:   . Film/video editor (Medical):   Marland Kitchen Lack of Transportation (Non-Medical):   Physical Activity:   . Days of Exercise per Week:   . Minutes of Exercise per Session:   Stress:   . Feeling of Stress :   Social Connections:   . Frequency of Communication with Friends and Family:   . Frequency of Social Gatherings with Friends and Family:   . Attends Religious Services:   .  Active Member of Clubs or Organizations:   . Attends Archivist Meetings:   Marland Kitchen Marital Status:    Family History  Problem Relation Age of Onset  . Prostate cancer Brother   . Heart disease Maternal Grandmother   . Brain cancer Mother    Allergies  Allergen Reactions  . Atorvastatin Other (See Comments)    Urinary retention   . Nebivolol Diarrhea     Positive ROS: All other systems have been reviewed and were otherwise negative with the exception of those mentioned in the HPI and as above.  Physical Exam: General: Alert, no acute distress. Sitting comfortably in bed. Cardiovascular: No pedal edema Respiratory: No cyanosis, no use of accessory musculature GI: No organomegaly, abdomen is soft and non-tender Skin: No lesions noted over bilateral knees.  Neurologic: Sensation intact distally Psychiatric: Patient is competent for consent with normal mood and affect Lymphatic: No axillary or cervical lymphadenopathy  MUSCULOSKELETAL:  RLE: mild effusion of right knee with moderate TTP to touch diffusely.  0-95 degrees of active ROM while sitting in the bed. No significant erythema over right knee, not hot to touch. Distal sensation and capillary refill are intact.  LLE: Large effusion of left knee with severe TTP to touch. Grinding felt with any movement of patella. 20-90 degrees of AROM while sitting in bed. No significant erythema over left knee, not hot to touch. Visualized arthroscopic knee scars.   Assessment/Plan: Principal Problem:   Enterococcal bacteremia Active Problems:   Essential hypertension   PAF (paroxysmal atrial fibrillation) (HCC)   Abnormal LFTs (liver function tests)   Cervical myelopathy (HCC)   S/P aortic valve replacement   ARF (acute renal failure) (HCC)  Bilateral knee pain L>R in septic patient - X-rays were taken showing diffuse degenerative changes as well as a large joint effusion. X-rays stated that septic joint could not be excluded. -  Plan for aspiration of the left knee - will get x-rays of right knee as well as this is also acutely painful, may need aspiration as well later this evening  - will keep NPO overnight based on appearance of synovial fluid, high likelihood that patient will need to have an irrigation and debridement of left knee tomorrow   Pre-procedure diagnosis: Left knee pain and effusion  Post-procedure Diagnosis: Same Procedure: Left knee aspiration  PROCEDURE DETAILS:  After informed verbal consent was obtained the superolateral portal was prepped with chlorhexidine. Approximately 3 mL of marcaine were injected superficially and then into the deeper tissues and into the joint space. Medicine was left to sit for several minutes to take effect. Next, a 30 mL syringe with an 18-gauge needle was used to aspirate approximately 30 ml of synovial fluid. He tolerated this well without complication and a Band-Aid was placed.  The fluid itself appeared turbid and abnormal.  It was sent for Gram stain culture and sensitivity and analysis.  Ventura Bruns, PA-C Cell [phone]   03/21/2020 3:58 PM   Patient reviewed, discussed, agree with above.  The results of the aspiration have come back, and they demonstrate only about 15,000 white blood cells, and positive intracellular calcium pyrophosphate crystals.  His knee effusion appears to be secondary to pseudogout, which may be exacerbated by his renal function.  It does not appear sepsis septic at this point, we will continue to monitor the Gram stain culture and sensitivity for final results.  We will also plan to recheck him tomorrow, and if his right knee becomes more symptomatic we may consider aspiration of the right knee, and we could also inject Depo-Medrol into both knees for some symptomatic control given the fact that it does not appear to be a septic joint.  We will continue to follow along with you.  Marchia Bond, MD

## 2020-03-21 NOTE — Consult Note (Signed)
Date of Admission:  03/19/2020          Reason for Consult: Enterococcal bacteremia   Referring Provider: [facility] consult   Assessment:  1. Enterococcal bacteremia with concern for 2. Prosthetic valve endocarditis 3. Possible bilateral septic knees 4. Postoperative cervical infection +/- lumbar infection 5. CKD with ARF 6. Hypoglycemia  Plan:  1. Ampicillin 2. Repeat blood cultures tomorrow 3. TTE and not sure if he is going to be able to get TEE with recent C spin surgery 4. Would ask Orthopedics to aspirate both knees (or at minimum the left and send for cell count and differential 5. Would MRI C and L spine when able WO gad 6. Repeat blood cultures 7. I will likely add ceftriaxone tomorrow  Principal Problem:   Enterococcal bacteremia Active Problems:   Essential hypertension   PAF (paroxysmal atrial fibrillation) (HCC)   Abnormal LFTs (liver function tests)   Cervical myelopathy (HCC)   S/P aortic valve replacement   ARF (acute renal failure) (HCC)   Scheduled Meds: . insulin aspart  0-6 Units Subcutaneous TID WC  . iron polysaccharides  150 mg Oral Q breakfast  . levothyroxine  25 mcg Oral Q0600  . midodrine  10 mg Oral TID WC  . sodium chloride flush  3 mL Intravenous Once   Continuous Infusions: . sodium chloride 100 mL/hr at 03/21/20 0927  . ampicillin (OMNIPEN) IV 2 g (03/21/20 0929)  . heparin 1,600 Units/hr (03/21/20 0511)   PRN Meds:.hydrALAZINE, oxyCODONE  HPI: Johnathan Murray is a 69 y.o. male with history of aortic valve replacement for aortic stenosis, paroxysmal atrial fibrillation diabetes mellitus hypertension lumbar stenosis, cervical spine disease with recent C-spine surgery with cervical discectomy and fusion by Dr. Trenton Gammon.  After the patient has been home for about a week after his recent hospitalization for neurosurgery he states that he began to develop severe neck and lower back pain.  This worsened along with worsening appetite.   He then began to have fevers and chills.  He also began to develop edema in his legs and effusions in his left worse than right knee that have been very tender to palpation.  He also developed profound weakness and was having difficulty even standing up.  He came to the ER where he was found to be in acute renal failure with a creatinine of 6.4 transaminases were up as well and he was hypotensive.  Blood cultures were taken and he was initiated on antibiotics with cefepime.  Blood cultures have subsequently come back positive for Enterococcus faecalis.  I am very concerned that he might have prosthetic valve endocarditis.  His knees are swollen and tender bilaterally left worse than right with obvious effusions.  I worry that he may have septic knees and I he needs to have at minimum the left knee aspirated and sent for cell count and differential.  Some of this could be third spacing but he should not be this tender to palpation in my opinion.  Given his neck pain and lower back pain also think that he clearly needs to have an MRI of the cervical spine and lumbar spine performed when he can tolerate this.  I am ordering a 2D echocardiogram to look for endocarditis.  I do not think that doing a transesophageal echocardiogram can be done without first talking to neurosurgery given he is recently had C-spine surgery.  If we cannot do a TEE we may need to treat him presumptively for  endocarditis with 2 drugs hopefully to beta-lactam's   Review of Systems: Review of Systems  Constitutional: Positive for chills and fever. Negative for malaise/fatigue and weight loss.  HENT: Negative for congestion and sore throat.   Eyes: Negative for blurred vision and photophobia.  Respiratory: Negative for cough, shortness of breath and wheezing.   Cardiovascular: Negative for chest pain, palpitations and leg swelling.  Gastrointestinal: Negative for abdominal pain, blood in stool, constipation, diarrhea,  heartburn, melena, nausea and vomiting.  Genitourinary: Negative for dysuria, flank pain and hematuria.  Musculoskeletal: Positive for back pain, joint pain and neck pain. Negative for falls and myalgias.  Skin: Negative for itching and rash.  Neurological: Positive for dizziness and weakness. Negative for focal weakness, loss of consciousness and headaches.  Endo/Heme/Allergies: Does not bruise/bleed easily.  Psychiatric/Behavioral: Negative for depression and suicidal ideas. The patient does not have insomnia.     Past Medical History:  Diagnosis Date  . Abnormal LFTs (liver function tests) 10/31/2016  . Abnormal nuclear stress test 05/03/2018  . Acid reflux 02/15/2018   not at present  . Acute otitis externa of left ear 11/13/2019  . Acute suppurative otitis media of left ear without spontaneous rupture of tympanic membrane 11/13/2019  . Anemia    low iron  . Aortic stenosis 05/03/2018  . Arthritis 02/15/2018   Overview:  shoulder  . Cancer ([place])    skin cancer on left wrist  . Cervical myelopathy ([place]) 04/22/2019  . Conductive hearing loss of left ear 11/13/2019  . Coronary artery disease involving native coronary artery of native heart without angina pectoris 05/30/2016  . Coronary artery disease of native artery of native heart with stable angina pectoris ([place])    pt. denies  . Diabetes mellitus due to underlying condition with unspecified complications ([place]) [id number]  . Dyslipidemia 05/30/2016  . Dyspnea    upon exertion,  as of 06/05/09 not having any recent chest pain  . Dysrhythmia    a fib  . Essential hypertension 05/30/2016  . Heart murmur    Has had an Aortic valve replacement  . Hyperactive gag reflex 02/15/2018  . Hypothyroidism   . Intermittent claudication ([place]) 10/31/2018  . Lumbar stenosis with neurogenic claudication 06/11/2019  . Macular degeneration 02/15/2018  . PAF (paroxysmal atrial fibrillation) ([place]) 05/30/2016  . Pre-operative cardiovascular examination  01/03/2020  . Preoperative cardiovascular examination 04/17/2019  . Renal insufficiency 01/17/2020  . S/P aortic valve replacement 01/17/2020  . Tinnitus of left ear 11/13/2019    Social History   Tobacco Use  . Smoking status: Never Smoker  . Smokeless tobacco: Never Used  Substance Use Topics  . Alcohol use: Not Currently  . Drug use: No    Family History  Problem Relation Age of Onset  . Prostate cancer Brother   . Heart disease Maternal Grandmother   . Brain cancer Mother    Allergies  Allergen Reactions  . Atorvastatin Other (See Comments)    Urinary retention   . Nebivolol Diarrhea    OBJECTIVE: Blood pressure 109/74, pulse 71, temperature 98.5 F (36.9 C), temperature source Oral, resp. rate 18, height 5\' 10"  (1.778 m), weight 81.9 kg, SpO2 98 %.  Physical Exam Constitutional:      General: He is not in acute distress.    Appearance: Normal appearance. He is well-developed. He is not ill-appearing or diaphoretic.  HENT:     Head: Normocephalic and atraumatic.     Right Ear: Hearing and external ear normal.  Left Ear: Hearing and external ear normal.     Nose: No nasal deformity or rhinorrhea.  Eyes:     General: No scleral icterus.    Conjunctiva/sclera: Conjunctivae normal.     Right eye: Right conjunctiva is not injected.     Left eye: Left conjunctiva is not injected.     Pupils: Pupils are equal, round, and reactive to light.  Neck:     Vascular: No JVD.  Cardiovascular:     Rate and Rhythm: Regular rhythm. Tachycardia present.     Heart sounds: S1 normal and S2 normal. Murmur present. No friction rub.  Pulmonary:     Effort: Pulmonary effort is normal. No respiratory distress.     Breath sounds: Normal breath sounds. No stridor. No wheezing or rhonchi.  Abdominal:     General: Bowel sounds are normal. There is no distension.     Palpations: Abdomen is soft.     Tenderness: There is no abdominal tenderness.  Musculoskeletal:        General:  Normal range of motion.     Right shoulder: Normal.     Left shoulder: Normal.     Cervical back: Normal range of motion and neck supple.     Right hip: Normal.     Left hip: Normal.     Right knee: Swelling and effusion present. Tenderness present.     Left knee: Swelling and effusion present. Tenderness present.  Lymphadenopathy:     Head:     Right side of head: No submandibular, preauricular or posterior auricular adenopathy.     Left side of head: No submandibular, preauricular or posterior auricular adenopathy.     Cervical: No cervical adenopathy.     Right cervical: No superficial or deep cervical adenopathy.    Left cervical: No superficial or deep cervical adenopathy.  Skin:    General: Skin is warm and dry.     Coloration: Skin is not pale.     Findings: No abrasion, bruising, ecchymosis, erythema, lesion or rash.     Nails: There is no clubbing.  Neurological:     Mental Status: He is alert and oriented to person, place, and time.     Sensory: No sensory deficit.     Coordination: Coordination normal.     Gait: Gait normal.  Psychiatric:        Attention and Perception: He is attentive.        Mood and Affect: Mood normal.        Speech: Speech normal.        Behavior: Behavior normal. Behavior is cooperative.        Thought Content: Thought content normal.        Judgment: Judgment normal.    Cervical spine incision is clean Lab Results Lab Results  Component Value Date   WBC 9.4 03/21/2020   HGB 8.2 (L) 03/21/2020   HCT 23.5 (L) 03/21/2020   MCV 87.0 03/21/2020   PLT 305 03/21/2020    Lab Results  Component Value Date   CREATININE 4.26 (H) 03/21/2020   BUN 78 (H) 03/21/2020   NA 131 (L) 03/21/2020   K 3.9 03/21/2020   CL 99 03/21/2020   CO2 20 (L) 03/21/2020    Lab Results  Component Value Date   ALT 564 (H) 03/21/2020   AST 648 (H) 03/21/2020   ALKPHOS 153 (H) 03/21/2020   BILITOT 5.3 (H) 03/21/2020     Microbiology: Recent Results (from  the past  240 hour(s))  Respiratory Panel by RT PCR (Flu A&B, Covid) - Nasopharyngeal Swab     Status: None   Collection Time: 03/19/20 10:22 PM   Specimen: Nasopharyngeal Swab  Result Value Ref Range Status   SARS Coronavirus 2 by RT PCR NEGATIVE NEGATIVE Final    Comment: (NOTE) SARS-CoV-2 target nucleic acids are NOT DETECTED. The SARS-CoV-2 RNA is generally detectable in upper respiratoy specimens during the acute phase of infection. The lowest concentration of SARS-CoV-2 viral copies this assay can detect is 131 copies/mL. A negative result does not preclude SARS-Cov-2 infection and should not be used as the sole basis for treatment or other patient management decisions. A negative result may occur with  improper specimen collection/handling, submission of specimen other than nasopharyngeal swab, presence of viral mutation(s) within the areas targeted by this assay, and inadequate number of viral copies (<131 copies/mL). A negative result must be combined with clinical observations, patient history, and epidemiological information. The expected result is Negative. Fact Sheet for Patients:  PinkCheek.be Fact Sheet for Healthcare Providers:  GravelBags.it This test is not yet ap proved or cleared by the [place] FDA and  has been authorized for detection and/or diagnosis of SARS-CoV-2 by FDA under an Emergency Use Authorization (EUA). This EUA will remain  in effect (meaning this test can be used) for the duration of the COVID-19 declaration under Section 564(b)(1) of the Act, 21 U.S.C. section 360bbb-3(b)(1), unless the authorization is terminated or revoked sooner.    Influenza A by PCR NEGATIVE NEGATIVE Final   Influenza B by PCR NEGATIVE NEGATIVE Final    Comment: (NOTE) The Xpert Xpress SARS-CoV-2/FLU/RSV assay is intended as an aid in  the diagnosis of influenza from Nasopharyngeal swab specimens and  should  not be used as a sole basis for treatment. Nasal washings and  aspirates are unacceptable for Xpert Xpress SARS-CoV-2/FLU/RSV  testing. Fact Sheet for Patients: PinkCheek.be Fact Sheet for Healthcare Providers: GravelBags.it This test is not yet approved or cleared by the [place] FDA and  has been authorized for detection and/or diagnosis of SARS-CoV-2 by  FDA under an Emergency Use Authorization (EUA). This EUA will remain  in effect (meaning this test can be used) for the duration of the  Covid-19 declaration under Section 564(b)(1) of the Act, 21  U.S.C. section 360bbb-3(b)(1), unless the authorization is  terminated or revoked. Performed at [facility], [place] [address] [place], [place] [address]   Urine culture     Status: None   Collection Time: 03/19/20 10:39 PM   Specimen: Urine, Random  Result Value Ref Range Status   Specimen Description URINE, RANDOM  Final   Special Requests NONE  Final   Culture   Final    NO GROWTH Performed at [facility], [place] [address] [place], [place] [address]    Report Status 03/20/2020 FINAL  Final  Culture, blood (routine x 2)     Status: Abnormal (Preliminary result)   Collection Time: 03/20/20  7:45 AM   Specimen: BLOOD RIGHT FOREARM  Result Value Ref Range Status   Specimen Description BLOOD RIGHT FOREARM  Final   Special Requests   Final    BOTTLES DRAWN AEROBIC ONLY Blood Culture results may not be optimal due to an inadequate volume of blood received in culture bottles   Culture  Setup Time   Final    AEROBIC BOTTLE ONLY GRAM POSITIVE COCCI CRITICAL RESULT CALLED TO, READ BACK BY AND VERIFIED WITH:  K AMEND PHARMD 03/21/20 0059 JDW    Culture (A)  Final    ENTEROCOCCUS FAECALIS SUSCEPTIBILITIES TO FOLLOW Performed at [facility], [place] [address], [place], [place] [address]    Report Status PENDING  Incomplete  Blood Culture ID Panel (Reflexed)      Status: Abnormal   Collection Time: 03/20/20  7:45 AM  Result Value Ref Range Status   Enterococcus species DETECTED (A) NOT DETECTED Final    Comment: CRITICAL RESULT CALLED TO, READ BACK BY AND VERIFIED WITH: K AMEND PHARMD 03/21/20 0059 JDW    Vancomycin resistance NOT DETECTED NOT DETECTED Final   Listeria monocytogenes NOT DETECTED NOT DETECTED Final   Staphylococcus species NOT DETECTED NOT DETECTED Final   Staphylococcus aureus (BCID) NOT DETECTED NOT DETECTED Final   Streptococcus species NOT DETECTED NOT DETECTED Final   Streptococcus agalactiae NOT DETECTED NOT DETECTED Final   Streptococcus pneumoniae NOT DETECTED NOT DETECTED Final   Streptococcus pyogenes NOT DETECTED NOT DETECTED Final   Acinetobacter baumannii NOT DETECTED NOT DETECTED Final   Enterobacteriaceae species NOT DETECTED NOT DETECTED Final   Enterobacter cloacae complex NOT DETECTED NOT DETECTED Final   Escherichia coli NOT DETECTED NOT DETECTED Final   Klebsiella oxytoca NOT DETECTED NOT DETECTED Final   Klebsiella pneumoniae NOT DETECTED NOT DETECTED Final   Proteus species NOT DETECTED NOT DETECTED Final   Serratia marcescens NOT DETECTED NOT DETECTED Final   Haemophilus influenzae NOT DETECTED NOT DETECTED Final   Neisseria meningitidis NOT DETECTED NOT DETECTED Final   Pseudomonas aeruginosa NOT DETECTED NOT DETECTED Final   Candida albicans NOT DETECTED NOT DETECTED Final   Candida glabrata NOT DETECTED NOT DETECTED Final   Candida krusei NOT DETECTED NOT DETECTED Final   Candida parapsilosis NOT DETECTED NOT DETECTED Final   Candida tropicalis NOT DETECTED NOT DETECTED Final    Comment: Performed at [facility], [address], [place], [place] [address]  Culture, blood (routine x 2)     Status: Abnormal (Preliminary result)   Collection Time: 03/20/20  8:55 AM   Specimen: BLOOD  Result Value Ref Range Status   Specimen Description BLOOD LEFT ANTECUBITAL  Final   Special Requests   Final     BOTTLES DRAWN AEROBIC AND ANAEROBIC Blood Culture results may not be optimal due to an inadequate volume of blood received in culture bottles   Culture  Setup Time   Final    IN BOTH AEROBIC AND ANAEROBIC BOTTLES GRAM POSITIVE COCCI CRITICAL VALUE NOTED.  VALUE IS CONSISTENT WITH PREVIOUSLY REPORTED AND CALLED VALUE. Performed at [facility], [place] [address] [place], [place] [address]    Culture ENTEROCOCCUS FAECALIS (A)  Final   Report Status PENDING  Incomplete    Alcide Evener, MD [facility] [phone] pager  03/21/2020, 11:18 AM

## 2020-03-21 NOTE — Progress Notes (Signed)
ANTICOAGULATION CONSULT NOTE - Follow Up Consult  Pharmacy Consult for heparin Indication: atrial fibrillation  Labs: Recent Labs    03/19/20 1801 03/19/20 1801 03/20/20 0331 03/20/20 1517 03/21/20 0234  HGB 10.4*   < > 8.5*  --  8.2*  HCT 31.2*  --  24.8*  --  23.5*  PLT 341  --  304  --  305  APTT  --   --   --  55* 53*  LABPROT  --   --  20.0*  --   --   INR  --   --  1.8*  --   --   HEPARINUNFRC  --   --   --  >2.20*  --   CREATININE 6.40*   < > 6.53* 5.15* 4.26*  CKTOTAL 20*  --  54  --   --   TROPONINIHS  --   --  48*  --   --    < > = values in this interval not displayed.    Assessment: 69yo male subtherapeutic on heparin with lower PTT despite rate increase; no gtt issues or signs of bleeding per RN.  Goal of Therapy:  aPTT 66-102 seconds   Plan:  Will increase heparin gtt by 3 units/kg/hr to 1600 units/hr and check PTT in 8 hours.    Wynona Neat, PharmD, BCPS  03/21/2020,4:56 AM

## 2020-03-21 NOTE — Progress Notes (Signed)
[facility] Progress Note  Name: Johnathan Murray MRN: [id number] DOB: 01/05/1951  Chief Complaint:  Fatigue and poor intake  Subjective:  He was found to have enterococcus bacteremia and has been started on ampicillin.  He had 2.1 liters UOP over 4/30.  He and his wife have several questions about his left leg/knee pain - spoke with team on 4/30 about ruptured baker's cyst, no DVT.  Has been off of fluids  Review of systems:  Denies n/v Denies shortness of breath Leg pain - left leg -------------- Background on consult:  Mr. Kapala is a 69 year old male with a history of CKD stage IIIa, hypertension, coronary artery disease who presented to the hospital with recent poor p.o. intake and feeling fatigued.  The patient had cervical spine surgery recently and has had decreased p.o. intake for the past couple weeks.  He has also had lower blood pressures than normal.  Normally takes lisinopril and was taken off of this last week because his blood pressure was in the 90s at an appointment.  He was also previously on Lasix.  He has been taking frequent ibuprofen at home as well.  He was found to have acute kidney injury with a creatinine of over 6.  Renal ultrasound without obstruction.  Nephrology is consulted for assistance with management.  On exam blood pressure initially in 80's and 70's on my exam.  We bolused with 250 mL and continued rate of fluids.  Improved to [id number] systolic on my exam.     Intake/Output Summary (Last 24 hours) at 03/21/2020 0835 Last data filed at 03/21/2020 0600 Gross per 24 hour  Intake 3543.73 ml  Output 2125 ml  Net 1418.73 ml    Vitals:  Vitals:   03/20/20 1636 03/20/20 2145 03/21/20 0205 03/21/20 0515  BP: (!) 100/56 (!) 108/55 (!) 110/54 111/67  Pulse: 74 75 75 75  Resp: 18 17 20 17   Temp: 98.3 F (36.8 C) 99.8 F (37.7 C) 99.5 F (37.5 C) 98.8 F (37.1 C)  TempSrc: Oral Oral Oral Oral  SpO2: 99% 100% 97% 98%  Weight:    81.9 kg   Height:         Physical Exam:  General adult male in bed in no acute distress HEENT normocephalic atraumatic extraocular movements intact  Neck supple trachea midline Lungs clear to auscultation bilaterally normal work of breathing at rest  Heart S1S2 no rub Abdomen soft nontender nondistended Extremities 1+ edema left leg and trace if any on right leg Psych normal mood and affect Neuro - alert and oriented x3; easily comes up with year   Medications reviewed   Labs:  BMP Latest Ref Rng & Units 03/21/2020 03/20/2020 03/20/2020  Glucose 70 - 99 mg/dL 80 72 71  BUN 8 - 23 mg/dL 78(H) 94(H) 106(H)  Creatinine 0.61 - 1.24 mg/dL 4.26(H) 5.15(H) 6.53(H)  BUN/Creat Ratio 10 - 24 - - -  Sodium 135 - 145 mmol/L 131(L) 133(L) 133(L)  Potassium 3.5 - 5.1 mmol/L 3.9 4.1 4.2  Chloride 98 - 111 mmol/L 99 99 98  CO2 22 - 32 mmol/L 20(L) 18(L) 16(L)  Calcium 8.9 - 10.3 mg/dL 8.8(L) 9.0 9.2     Assessment/Plan:   # Acute kidney injury - Multifactorial with hypotension, decreased intake, Lasix and RAAS blockade recently, and heavy NSAID use up to admission.  UPC ratio of 900 mg/g.  Thankfully improving with hydration and also now on treatment for bacteremia.  Hepatitis non-reactive. HIV non-reactive. Hematuria resolved.  Up/c ratio 900 mg/g - Would resume hydration as tolerated with normal saline 100 ml/hour x 24 hours. Watch Na - Initially with hematuria (resolved) and ANA and MPO/PR3 still pending   # Enterococcus bacteremia  - on ampicillin  - work-up per primary team   # Hypotension -normally is hypotensive.  Team was contacted pulm/critical care.   - Continue supportive care - Improving  - abx for bacteremia per primary team   # Cervical decompression - Supportive care per primary team - ? Unclear if source for bacteremia   # CKD stage IIIa - Baseline creatinine appears to be 1.3-1.6  # Hematuria - resolved on repeat.  Complement normal.  ANA and MPO/PR3 pending   #  Normocytic anemia   - No acute indication for packed red blood cells but follow for need.  Iron a bit low - defer IV.  Oral iron. - some component may be dilutional   Claudia Desanctis, MD 03/21/2020 9:00 AM

## 2020-03-21 NOTE — Hospital Course (Signed)
Mar 21, 2020 aspiration of the left knee demonstrates intracellular calcium pyrophosphate crystals and a white blood cell count of 15,800, all consistent with pseudogout.

## 2020-03-21 NOTE — Progress Notes (Signed)
ANTICOAGULATION CONSULT NOTE Follow Up Consult  Pharmacy Consult for Heparin Indication: atrial fibrillation  Patient Measurements: Height: 5\' 10"  (177.8 cm) Weight: 81.9 kg (180 lb 8.9 oz) IBW/kg (Calculated) : 73  Heparin Dosing Weight: 86.2 kg  Vital Signs: Temp: 98.8 F (37.1 C) (05/01 0515) Temp Source: Oral (05/01 0515) BP: 111/67 (05/01 0515) Pulse Rate: 75 (05/01 0515)  Labs: Recent Labs    03/19/20 1801 03/19/20 1801 03/20/20 0331 03/20/20 1517 03/21/20 0234  HGB 10.4*   < > 8.5*  --  8.2*  HCT 31.2*  --  24.8*  --  23.5*  PLT 341  --  304  --  305  APTT  --   --   --  55* 53*  LABPROT  --   --  20.0*  --   --   INR  --   --  1.8*  --   --   HEPARINUNFRC  --   --   --  >2.20* >2.20*  CREATININE 6.40*   < > 6.53* 5.15* 4.26*  CKTOTAL 20*  --  54  --   --   TROPONINIHS  --   --  48*  --   --    < > = values in this interval not displayed.    Estimated Creatinine Clearance: 17.4 mL/min (A) (by C-G formula based on SCr of 4.26 mg/dL (H)).   Assessment: 26 YOM admitted for further workup of AKI and elevated LFTs. Pt has hx of a fib, for which he takes Eliquis at home; pharmacy was consulted to transition pt from Eliquis to IV heparin; last dose of Eliquis was taken on 4/29).  aPTT this afternoon is therapeutic after a dose increase earlier today (aPTT 76 << 53, goal 66-85).   Pt had recent back surgery (cervical discectomy, fusion) ~3 weeks ago (reporting neck and back pain) and mild hematuria in ED, so using lower anticoagulation goal ranges at this time (see below).  Goal of Therapy:  Heparin level: 0.3-0.5 units/ml  aPTT 66-85 sec Monitor platelets by anticoagulation protocol: Yes   Plan:  - Continue Heparin at 1600 units/hr (16 ml/hr) - Will continue to monitor for any signs/symptoms of bleeding and will follow up with aPTT in 8 hours to confirm therapeutic  Thank you for allowing pharmacy to be a part of this patient's care.  Alycia Rossetti,  PharmD, BCPS Clinical Pharmacist Clinical phone for 03/21/2020: [phone] 03/21/2020 8:00 AM   **Pharmacist phone directory can now be found on [email or web address] ([email or web address]).  Listed under [place].

## 2020-03-21 NOTE — Progress Notes (Signed)
PROGRESS NOTE  Johnathan Murray [id number] DOB: [birth year] DOA: 03/19/2020 PCP: Nicoletta Dress, MD  HPI/Recap of past 24 hours: HPI from Dr Julio Sicks is a 69 y.o. male with history of paroxysmal atrial fibrillation, hypertension, bioprosthetic aortic valve replacement who had undergone recent cervical discectomy and fusion about 3 weeks ago done by Dr. Trenton Gammon has been feeling weak with increasing pain in the neck and low back and poor appetite.  Denies any vomiting or diarrhea denies taking any Tylenol but does take NSAIDs sometimes.  Has not noticed any blood in the urine and has been urinating less last few days.  Has subjective feeling of fever chills.  Given the symptoms patient presents to the ER.  Patient states over the last 3 weeks and surgery patient has been finding it difficult to ambulate because of the weakness of the lower extremity.  Denies any incontinence of urine or bowel.  Has been having more lower extremity edema more on the left side. In the ER on exam patient appears generally weak with weakness of the lower extremity.  There is edema of the lower extremity on the left side.  Labs show creatinine of 6.4 which is increased from 1.6 about 3 weeks ago.  In addition patient's LFTs are markedly elevated at AST of 415 and ALT of 429 patient's albumin is 2.6 total bilirubin 5.5 bicarb 19 sodium 130 calcium 10.2 hemoglobin 10.4 lactic acid 1.6 WBC 12.6 platelets 341 urinalysis shows RBCs of 21-50 WBC 21-50 protein around 100 bacteria rare hyaline cast present mucus present. Patient was given fluid bolus and admitted for acute renal failure.  Patient also had CT abdomen pelvis and CT cervical spine which did not show any acute. USS of the abdomen also was unremarkable.  Covid test is negative.     Today, pt still c/o chronic L knee pain, denies any fever/chills, abdominal pain, N/V, diarrhea, chest pain, SOB.    Assessment/Plan: Principal Problem:  Enterococcal bacteremia Active Problems:   Essential hypertension   PAF (paroxysmal atrial fibrillation) (HCC)   Abnormal LFTs (liver function tests)   Cervical myelopathy (HCC)   S/P aortic valve replacement   ARF (acute renal failure) (HCC)   AKI on CKD stage III/anion gap metabolic acidosis Multifactorial from poor oral intake, in addition to medication induced from NSAID use, Lasix and lisinopril Baseline creatinine 1.3-1.6, on presentation 6.40 Noted urine with RBCs and protein-further work-up Per nephrology Continue IV fluids Hold patient's Lasix, lisinopril CT abdomen does not show any obstruction Nephrology consulted, appreciate recommendations Daily BMP  Sepsis likely 2/2 Enterococcus faecalis bacteremia On presentation, patient was hypotensive, with leukocytosis Source from possible septic knee Vs recent postoperative cervical/lumbar infection Afebrile, with no leukocytosis  BC x2 growing Enterococcus faecalis UC no growth, Chest x-ray unremarkable Lactic acid, TSH WNL, cortisol WNL Procalcitonin elevated, will trend TTE pending, to rule out prosthetic valve endocarditis (unsure if patient will be able to get TEE with recent C-spine surgery) Consulted orthopedics on 03/21/2020, to assist in joint aspiration, to send the fluid for analysis Switch antibiotics to IV ampicillin for now Continue IV fluids PCCM consulted due to hypotension, appreciate recs ID on board, appreciate recs Monitor closely  Transaminitis ?? Likely 2/2 above sepsis/hypotension/shock liver INR WNL Acute hepatitis panel nonreactive Continue IV hydration Hold home amiodarone Daily CMP  Ruptured Baker's cyst LLE/possible septic joint Doppler showed ruptured Baker's cyst on LLE Left knee x-ray showed large joint effusion, septic joint cannot be excluded Orthopedics consulted  for joint aspiration  Paroxysmal A. Fib/bioprosthetic aortic valve replacement Heart rate WNL Hold home amiodarone TTE  pending to rule out prosthetic valve endocarditis Switch to IV Heparin, continue to hold home Eliquis for now due to transaminitis and the need for any procedure Telemetry  Neck and back pain History of recent cervical decompression surgery Imaging does not show anything acute as mentioned above Pain management, if pain worsens/persists we will consult Dr. Trenton Gammon patient's neurosurgeon PT/OT  Normocytic anemia/postop anemia Hemoglobin dropped to 8.5, no obvious signs of bleeding Anemia panel iron 43, sats 29, ferritin 3,811 Daily CBC  Hypothyroidism TSH levels WNL Continue Synthroid       Malnutrition Type:      Malnutrition Characteristics:      Nutrition Interventions:       Estimated body mass index is 25.91 kg/m as calculated from the following:   Height as of this encounter: 5\' 10"  (1.778 m).   Weight as of this encounter: 81.9 kg.      Code Status: Full  Family Communication: Discussed extensively with wife at bedside  Disposition Plan: Status is: Inpatient  Remains inpatient appropriate because:Hemodynamically unstable and Inpatient level of care appropriate due to severity of illness   Dispo: The patient is from: Home              Anticipated d/c is to: To be determined              Anticipated d/c date is: To be determined              Patient currently is not medically stable to d/c.-Due to patient being hemodynamically unstable, requiring extensive work-up, consultant signing off     Consultants:  Nephrology  PCCM  Orthopedics  Procedures:  None  Antimicrobials:  Ampicillin  DVT prophylaxis: IV Heparin   Objective: Vitals:   03/20/20 2145 03/21/20 0205 03/21/20 0515 03/21/20 0935  BP: (!) 108/55 (!) 110/54 111/67 109/74  Pulse: 75 75 75 71  Resp: 17 20 17 18   Temp: 99.8 F (37.7 C) 99.5 F (37.5 C) 98.8 F (37.1 C) 98.5 F (36.9 C)  TempSrc: Oral Oral Oral Oral  SpO2: 100% 97% 98% 98%  Weight:   81.9 kg   Height:         Intake/Output Summary (Last 24 hours) at 03/21/2020 1454 Last data filed at 03/21/2020 1300 Gross per 24 hour  Intake 2012.81 ml  Output 2600 ml  Net -587.19 ml   Filed Weights   03/20/20 0500 03/21/20 0515  Weight: 86.2 kg 81.9 kg    Exam:  General: NAD, lethargic, deconditioned  Cardiovascular: S1, S2 present  Respiratory: CTAB  Abdomen: Soft, nontender, nondistended, bowel sounds present  Musculoskeletal: Trace pedal edema noted on the RLE, +1 noted on the LLE, with tender and swollen left knee joint  Skin: Normal  Psychiatry: Normal mood  Neurology: No obvious neurologic deficits noted, although generalized weakness noted    Data Reviewed: CBC: Recent Labs  Lab 03/19/20 1801 03/20/20 0331 03/21/20 0234  WBC 12.6* 13.1* 9.4  NEUTROABS 10.3* 10.5* 6.9  HGB 10.4* 8.5* 8.2*  HCT 31.2* 24.8* 23.5*  MCV 89.7 87.0 87.0  PLT 341 304 [id number]   Basic Metabolic Panel: Recent Labs  Lab 03/19/20 1801 03/20/20 0331 03/20/20 1517 03/21/20 0234  NA 130* 133* 133* 131*  K 4.1 4.2 4.1 3.9  CL 94* 98 99 99  CO2 19* 16* 18* 20*  GLUCOSE 134* 71 72 80  BUN 98*  106* 94* 78*  CREATININE 6.40* 6.53* 5.15* 4.26*  CALCIUM 10.2 9.2 9.0 8.8*   GFR: Estimated Creatinine Clearance: 17.4 mL/min (A) (by C-G formula based on SCr of 4.26 mg/dL (H)). Liver Function Tests: Recent Labs  Lab 03/19/20 1801 03/20/20 0331 03/21/20 0234  AST 415* 227* 648*  ALT 429* 302* 564*  ALKPHOS 158* 127* 153*  BILITOT 5.5* 4.8* 5.3*  PROT 7.8 6.5 6.1*  ALBUMIN 2.6* 2.1* 1.9*   No results for input(s): LIPASE, AMYLASE in the last 168 hours. No results for input(s): AMMONIA in the last 168 hours. Coagulation Profile: Recent Labs  Lab 03/20/20 0331  INR 1.8*   Cardiac Enzymes: Recent Labs  Lab 03/19/20 1801 03/20/20 0331  CKTOTAL 20* 54   BNP (last 3 results) No results for input(s): PROBNP in the last 8760 hours. HbA1C: No results for input(s): HGBA1C in the last 72  hours. CBG: Recent Labs  Lab 03/21/20 0207 03/21/20 0259 03/21/20 0625 03/21/20 1137 03/21/20 1228  GLUCAP 62* 84 78 57* 86   Lipid Profile: No results for input(s): CHOL, HDL, LDLCALC, TRIG, CHOLHDL, LDLDIRECT in the last 72 hours. Thyroid Function Tests: Recent Labs    03/20/20 0855  TSH 1.264   Anemia Panel: Recent Labs    03/20/20 1517  VITAMINB12 3,628*  FOLATE 11.7  FERRITIN 3,811*  TIBC 150*  IRON 43*   Urine analysis:    Component Value Date/Time   COLORURINE AMBER (A) 03/20/2020 1001   APPEARANCEUR HAZY (A) 03/20/2020 1001   LABSPEC 1.013 03/20/2020 1001   PHURINE 5.0 03/20/2020 1001   GLUCOSEU NEGATIVE 03/20/2020 1001   HGBUR SMALL (A) 03/20/2020 1001   BILIRUBINUR NEGATIVE 03/20/2020 1001   KETONESUR NEGATIVE 03/20/2020 1001   PROTEINUR 30 (A) 03/20/2020 1001   NITRITE NEGATIVE 03/20/2020 1001   LEUKOCYTESUR NEGATIVE 03/20/2020 1001   Sepsis Labs: @LABRCNTIP (procalcitonin:4,lacticidven:4)  ) Recent Results (from the past 240 hour(s))  Respiratory Panel by RT PCR (Flu A&B, Covid) - Nasopharyngeal Swab     Status: None   Collection Time: 03/19/20 10:22 PM   Specimen: Nasopharyngeal Swab  Result Value Ref Range Status   SARS Coronavirus 2 by RT PCR NEGATIVE NEGATIVE Final    Comment: (NOTE) SARS-CoV-2 target nucleic acids are NOT DETECTED. The SARS-CoV-2 RNA is generally detectable in upper respiratoy specimens during the acute phase of infection. The lowest concentration of SARS-CoV-2 viral copies this assay can detect is 131 copies/mL. A negative result does not preclude SARS-Cov-2 infection and should not be used as the sole basis for treatment or other patient management decisions. A negative result may occur with  improper specimen collection/handling, submission of specimen other than nasopharyngeal swab, presence of viral mutation(s) within the areas targeted by this assay, and inadequate number of viral copies (<131 copies/mL). A  negative result must be combined with clinical observations, patient history, and epidemiological information. The expected result is Negative. Fact Sheet for Patients:  PinkCheek.be Fact Sheet for Healthcare Providers:  GravelBags.it This test is not yet ap proved or cleared by the [place] FDA and  has been authorized for detection and/or diagnosis of SARS-CoV-2 by FDA under an Emergency Use Authorization (EUA). This EUA will remain  in effect (meaning this test can be used) for the duration of the COVID-19 declaration under Section 564(b)(1) of the Act, 21 U.S.C. section 360bbb-3(b)(1), unless the authorization is terminated or revoked sooner.    Influenza A by PCR NEGATIVE NEGATIVE Final   Influenza B by PCR NEGATIVE NEGATIVE  Final    Comment: (NOTE) The Xpert Xpress SARS-CoV-2/FLU/RSV assay is intended as an aid in  the diagnosis of influenza from Nasopharyngeal swab specimens and  should not be used as a sole basis for treatment. Nasal washings and  aspirates are unacceptable for Xpert Xpress SARS-CoV-2/FLU/RSV  testing. Fact Sheet for Patients: PinkCheek.be Fact Sheet for Healthcare Providers: GravelBags.it This test is not yet approved or cleared by the [place] FDA and  has been authorized for detection and/or diagnosis of SARS-CoV-2 by  FDA under an Emergency Use Authorization (EUA). This EUA will remain  in effect (meaning this test can be used) for the duration of the  Covid-19 declaration under Section 564(b)(1) of the Act, 21  U.S.C. section 360bbb-3(b)(1), unless the authorization is  terminated or revoked. Performed at [facility], [place] [address], [place], [place] [address]   Urine culture     Status: None   Collection Time: 03/19/20 10:39 PM   Specimen: Urine, Random  Result Value Ref Range Status   Specimen Description URINE,  RANDOM  Final   Special Requests NONE  Final   Culture   Final    NO GROWTH Performed at [facility], [place] [address], [place], [place] [address]    Report Status 03/20/2020 FINAL  Final  Culture, blood (routine x 2)     Status: Abnormal (Preliminary result)   Collection Time: 03/20/20  7:45 AM   Specimen: BLOOD RIGHT FOREARM  Result Value Ref Range Status   Specimen Description BLOOD RIGHT FOREARM  Final   Special Requests   Final    BOTTLES DRAWN AEROBIC ONLY Blood Culture results may not be optimal due to an inadequate volume of blood received in culture bottles   Culture  Setup Time   Final    AEROBIC BOTTLE ONLY GRAM POSITIVE COCCI CRITICAL RESULT CALLED TO, READ BACK BY AND VERIFIED WITH: K AMEND PHARMD 03/21/20 0059 JDW    Culture (A)  Final    ENTEROCOCCUS FAECALIS SUSCEPTIBILITIES TO FOLLOW Performed at [facility], [place] [address] [place], [place] [address]    Report Status PENDING  Incomplete  Blood Culture ID Panel (Reflexed)     Status: Abnormal   Collection Time: 03/20/20  7:45 AM  Result Value Ref Range Status   Enterococcus species DETECTED (A) NOT DETECTED Final    Comment: CRITICAL RESULT CALLED TO, READ BACK BY AND VERIFIED WITH: K AMEND PHARMD 03/21/20 0059 JDW    Vancomycin resistance NOT DETECTED NOT DETECTED Final   Listeria monocytogenes NOT DETECTED NOT DETECTED Final   Staphylococcus species NOT DETECTED NOT DETECTED Final   Staphylococcus aureus (BCID) NOT DETECTED NOT DETECTED Final   Streptococcus species NOT DETECTED NOT DETECTED Final   Streptococcus agalactiae NOT DETECTED NOT DETECTED Final   Streptococcus pneumoniae NOT DETECTED NOT DETECTED Final   Streptococcus pyogenes NOT DETECTED NOT DETECTED Final   Acinetobacter baumannii NOT DETECTED NOT DETECTED Final   Enterobacteriaceae species NOT DETECTED NOT DETECTED Final   Enterobacter cloacae complex NOT DETECTED NOT DETECTED Final   Escherichia coli NOT DETECTED NOT DETECTED Final     Klebsiella oxytoca NOT DETECTED NOT DETECTED Final   Klebsiella pneumoniae NOT DETECTED NOT DETECTED Final   Proteus species NOT DETECTED NOT DETECTED Final   Serratia marcescens NOT DETECTED NOT DETECTED Final   Haemophilus influenzae NOT DETECTED NOT DETECTED Final   Neisseria meningitidis NOT DETECTED NOT DETECTED Final   Pseudomonas aeruginosa NOT DETECTED NOT DETECTED  Final   Candida albicans NOT DETECTED NOT DETECTED Final   Candida glabrata NOT DETECTED NOT DETECTED Final   Candida krusei NOT DETECTED NOT DETECTED Final   Candida parapsilosis NOT DETECTED NOT DETECTED Final   Candida tropicalis NOT DETECTED NOT DETECTED Final    Comment: Performed at [facility], [place] [address], [place], [place] [address]  Culture, blood (routine x 2)     Status: Abnormal (Preliminary result)   Collection Time: 03/20/20  8:55 AM   Specimen: BLOOD  Result Value Ref Range Status   Specimen Description BLOOD LEFT ANTECUBITAL  Final   Special Requests   Final    BOTTLES DRAWN AEROBIC AND ANAEROBIC Blood Culture results may not be optimal due to an inadequate volume of blood received in culture bottles   Culture  Setup Time   Final    IN BOTH AEROBIC AND ANAEROBIC BOTTLES GRAM POSITIVE COCCI CRITICAL VALUE NOTED.  VALUE IS CONSISTENT WITH PREVIOUSLY REPORTED AND CALLED VALUE. Performed at [facility], [place] [address], [place],  [address]    Culture ENTEROCOCCUS FAECALIS (A)  Final   Report Status PENDING  Incomplete      Studies: DG Knee Left Port  Result Date: 03/21/2020 CLINICAL DATA:  LEFT knee pain and swelling, question septic knee EXAM: PORTABLE LEFT KNEE - 1-2 VIEW COMPARISON:  Portable exam 1146 hours without priors for comparison FINDINGS: Osseous demineralization. Scattered joint space narrowing and chondrocalcinosis. Lateral subluxation of tibia. Large joint effusion. Anterior soft tissue swelling. No acute fracture, dislocation or bone destruction. IMPRESSION:  Osseous demineralization with diffuse degenerative changes and question CPPD LEFT knee. Large joint effusion and mild anterior soft tissue swelling without definite acute osseous findings. Septic joint cannot be excluded radiographically; if this is a clinical concern, recommend joint aspiration. Electronically Signed   By: Lavonia Dana M.D.   On: 03/21/2020 13:20    Scheduled Meds: . insulin aspart  0-6 Units Subcutaneous TID WC  . iron polysaccharides  150 mg Oral Q breakfast  . levothyroxine  25 mcg Oral Q0600  . midodrine  10 mg Oral TID WC  . sodium chloride flush  3 mL Intravenous Once    Continuous Infusions: . sodium chloride 100 mL/hr at 03/21/20 0927  . ampicillin (OMNIPEN) IV 2 g (03/21/20 0929)  . heparin 1,600 Units/hr (03/21/20 0511)     LOS: 1 day     Alma Friendly, MD [facility]  If 7PM-7AM, please contact night-coverage [email or web address] 03/21/2020, 2:54 PM

## 2020-03-22 ENCOUNTER — Inpatient Hospital Stay (HOSPITAL_COMMUNITY): Payer: BC Managed Care – PPO

## 2020-03-22 ENCOUNTER — Other Ambulatory Visit (HOSPITAL_COMMUNITY): Payer: BC Managed Care – PPO

## 2020-03-22 DIAGNOSIS — I340 Nonrheumatic mitral (valve) insufficiency: Secondary | ICD-10-CM

## 2020-03-22 DIAGNOSIS — I361 Nonrheumatic tricuspid (valve) insufficiency: Secondary | ICD-10-CM | POA: Diagnosis not present

## 2020-03-22 DIAGNOSIS — M11262 Other chondrocalcinosis, left knee: Secondary | ICD-10-CM

## 2020-03-22 LAB — CULTURE, BLOOD (ROUTINE X 2)

## 2020-03-22 LAB — COMPREHENSIVE METABOLIC PANEL
ALT: 458 U/L — ABNORMAL HIGH (ref 0–44)
AST: 295 U/L — ABNORMAL HIGH (ref 15–41)
Albumin: 1.8 g/dL — ABNORMAL LOW (ref 3.5–5.0)
Alkaline Phosphatase: 143 U/L — ABNORMAL HIGH (ref 38–126)
Anion gap: 10 (ref 5–15)
BUN: 52 mg/dL — ABNORMAL HIGH (ref 8–23)
CO2: 22 mmol/L (ref 22–32)
Calcium: 8.8 mg/dL — ABNORMAL LOW (ref 8.9–10.3)
Chloride: 101 mmol/L (ref 98–111)
Creatinine, Ser: 2.62 mg/dL — ABNORMAL HIGH (ref 0.61–1.24)
GFR calc Af Amer: 28 mL/min — ABNORMAL LOW (ref >60)
GFR calc non Af Amer: 24 mL/min — ABNORMAL LOW (ref >60)
Glucose, Bld: 88 mg/dL (ref 70–99)
Potassium: 4.1 mmol/L (ref 3.5–5.1)
Sodium: 133 mmol/L — ABNORMAL LOW (ref 135–145)
Total Bilirubin: 4.7 mg/dL — ABNORMAL HIGH (ref 0.3–1.2)
Total Protein: 6.3 g/dL — ABNORMAL LOW (ref 6.5–8.1)

## 2020-03-22 LAB — GLUCOSE, CAPILLARY
Glucose-Capillary: 82 mg/dL (ref 70–99)
Glucose-Capillary: 90 mg/dL (ref 70–99)
Glucose-Capillary: 114 mg/dL — ABNORMAL HIGH (ref 70–99)
Glucose-Capillary: 157 mg/dL — ABNORMAL HIGH (ref 70–99)
Glucose-Capillary: 205 mg/dL — ABNORMAL HIGH (ref 70–99)

## 2020-03-22 LAB — CBC WITH DIFFERENTIAL/PLATELET
Abs Immature Granulocytes: 0.85 10*3/uL — ABNORMAL HIGH (ref 0.00–0.07)
Basophils Absolute: 0.1 10*3/uL (ref 0.0–0.1)
Basophils Relative: 1 %
Eosinophils Absolute: 0 10*3/uL (ref 0.0–0.5)
Eosinophils Relative: 0 %
HCT: 22.8 % — ABNORMAL LOW (ref 39.0–52.0)
Hemoglobin: 8.1 g/dL — ABNORMAL LOW (ref 13.0–17.0)
Immature Granulocytes: 6 %
Lymphocytes Relative: 10 %
Lymphs Abs: 1.3 10*3/uL (ref 0.7–4.0)
MCH: 30.6 pg (ref 26.0–34.0)
MCHC: 35.5 g/dL (ref 30.0–36.0)
MCV: 86 fL (ref 80.0–100.0)
Monocytes Absolute: 1.2 10*3/uL — ABNORMAL HIGH (ref 0.1–1.0)
Monocytes Relative: 8 %
Neutro Abs: 10.6 10*3/uL — ABNORMAL HIGH (ref 1.7–7.7)
Neutrophils Relative %: 75 %
Platelets: 316 10*3/uL (ref 150–400)
RBC: 2.65 MIL/uL — ABNORMAL LOW (ref 4.22–5.81)
RDW: 14.6 % (ref 11.5–15.5)
WBC: 13.7 10*3/uL — ABNORMAL HIGH (ref 4.0–10.5)
nRBC: 0 % (ref 0.0–0.2)

## 2020-03-22 LAB — GRAM STAIN

## 2020-03-22 LAB — PROCALCITONIN: Procalcitonin: 0.44 ng/mL

## 2020-03-22 LAB — APTT: aPTT: 76 seconds — ABNORMAL HIGH (ref 24–36)

## 2020-03-22 LAB — ECHOCARDIOGRAM COMPLETE
Height: 70 in
Weight: 2888.91 oz

## 2020-03-22 LAB — HEPARIN LEVEL (UNFRACTIONATED): Heparin Unfractionated: 2.16 IU/mL — ABNORMAL HIGH (ref 0.30–0.70)

## 2020-03-22 MED ORDER — BUPIVACAINE HCL (PF) 0.5 % IJ SOLN
10.0000 mL | Freq: Once | INTRAMUSCULAR | Status: DC
  Filled 2020-03-22: qty 10

## 2020-03-22 MED ORDER — METHYLPREDNISOLONE ACETATE 40 MG/ML IJ SUSP
40.0000 mg | Freq: Once | INTRAMUSCULAR | Status: DC
  Filled 2020-03-22: qty 1

## 2020-03-22 MED ORDER — SODIUM CHLORIDE 0.9 % IV SOLN
2.0000 g | Freq: Four times a day (QID) | INTRAVENOUS | Status: DC
  Administered 2020-03-22 – 2020-04-07 (×66): 2 g via INTRAVENOUS
  Filled 2020-03-22 (×6): qty 2
  Filled 2020-03-22: qty 2000
  Filled 2020-03-22 (×2): qty 2
  Filled 2020-03-22 (×2): qty 2000
  Filled 2020-03-22 (×3): qty 2
  Filled 2020-03-22: qty 2000
  Filled 2020-03-22 (×5): qty 2
  Filled 2020-03-22 (×2): qty 2000
  Filled 2020-03-22 (×4): qty 2
  Filled 2020-03-22: qty 2000
  Filled 2020-03-22 (×5): qty 2
  Filled 2020-03-22: qty 2000
  Filled 2020-03-22: qty 2
  Filled 2020-03-22: qty 2000
  Filled 2020-03-22 (×7): qty 2
  Filled 2020-03-22: qty 2000
  Filled 2020-03-22: qty 2
  Filled 2020-03-22: qty 2000
  Filled 2020-03-22 (×2): qty 2
  Filled 2020-03-22 (×2): qty 2000
  Filled 2020-03-22 (×3): qty 2
  Filled 2020-03-22 (×3): qty 2000
  Filled 2020-03-22 (×12): qty 2
  Filled 2020-03-22: qty 2000
  Filled 2020-03-22 (×3): qty 2

## 2020-03-22 MED ORDER — SODIUM CHLORIDE 0.9 % IV SOLN
2.0000 g | Freq: Two times a day (BID) | INTRAVENOUS | Status: DC
  Administered 2020-03-22 – 2020-03-26 (×9): 2 g via INTRAVENOUS
  Filled 2020-03-22 (×9): qty 20

## 2020-03-22 MED ORDER — METOPROLOL TARTRATE 5 MG/5ML IV SOLN
5.0000 mg | Freq: Once | INTRAVENOUS | Status: DC
  Filled 2020-03-22: qty 5

## 2020-03-22 MED ORDER — SODIUM CHLORIDE 0.9 % IV SOLN: INTRAVENOUS | Status: AC

## 2020-03-22 MED ORDER — METOPROLOL TARTRATE 5 MG/5ML IV SOLN: 5.0000 mg | Freq: Once | INTRAVENOUS | Status: DC | PRN

## 2020-03-22 MED ORDER — PERFLUTREN LIPID MICROSPHERE
1.0000 mL | INTRAVENOUS | Status: AC | PRN
  Administered 2020-03-22: 2 mL via INTRAVENOUS
  Filled 2020-03-22: qty 10

## 2020-03-22 NOTE — Progress Notes (Signed)
[facility] Progress Note  Name: Johnathan Murray MRN: [id number] DOB: 09-19-1951  Chief Complaint:  Fatigue and poor intake  Subjective:  He had 3.4 liters UOP over 5/1.  Hasn't really felt much like eating.  Updated his wife at bedside.  Review of systems:   Denies n/v Denies shortness of breath Leg pain - left leg -------------- Background on consult:  Mr. Arnzen is a 69 year old male with a history of CKD stage IIIa, hypertension, coronary artery disease who presented to the hospital with recent poor p.o. intake and feeling fatigued.  The patient had cervical spine surgery recently and has had decreased p.o. intake for the past couple weeks.  He has also had lower blood pressures than normal.  Normally takes lisinopril and was taken off of this last week because his blood pressure was in the 90s at an appointment.  He was also previously on Lasix.  He has been taking frequent ibuprofen at home as well.  He was found to have acute kidney injury with a creatinine of over 6.  Renal ultrasound without obstruction.  Nephrology is consulted for assistance with management.  On exam blood pressure initially in 80's and 70's on my exam.  We bolused with 250 mL and continued rate of fluids.  Improved to [id number] systolic on my exam.     Intake/Output Summary (Last 24 hours) at 03/22/2020 0945 Last data filed at 03/22/2020 0848 Gross per 24 hour  Intake 2419.82 ml  Output 3045 ml  Net -625.18 ml    Vitals:  Vitals:   03/21/20 2045 03/22/20 0103 03/22/20 0308 03/22/20 0527  BP: 123/70 (!) 125/56 119/74 (!) 117/57  Pulse: (!) 48 (!) 57 81 86  Resp: 16 17 18 16   Temp: 98.4 F (36.9 C)  98.3 F (36.8 C) 98.4 F (36.9 C)  TempSrc:      SpO2: 99% 96% 99% 97%  Weight:      Height:         Physical Exam:  General adult male in bed in no acute distress HEENT normocephalic atraumatic extraocular movements intact  Neck supple trachea midline Lungs clear to auscultation  bilaterally normal work of breathing at rest  Heart S1S2 no rub Abdomen soft nontender nondistended Extremities 1+ edema left leg and trace on right leg Psych normal mood and affect Neuro - alert and oriented x3; provides hx and follows commands  Medications reviewed   Labs:  BMP Latest Ref Rng & Units 03/22/2020 03/21/2020 03/20/2020  Glucose 70 - 99 mg/dL 88 80 72  BUN 8 - 23 mg/dL 52(H) 78(H) 94(H)  Creatinine 0.61 - 1.24 mg/dL 2.62(H) 4.26(H) 5.15(H)  BUN/Creat Ratio 10 - 24 - - -  Sodium 135 - 145 mmol/L 133(L) 131(L) 133(L)  Potassium 3.5 - 5.1 mmol/L 4.1 3.9 4.1  Chloride 98 - 111 mmol/L 101 99 99  CO2 22 - 32 mmol/L 22 20(L) 18(L)  Calcium 8.9 - 10.3 mg/dL 8.8(L) 8.8(L) 9.0     Assessment/Plan:   # Acute kidney injury - Multifactorial with hypotension, decreased intake, Lasix and RAAS blockade recently, and heavy NSAID use up to admission.  UPC ratio of 900 mg/g.  Thankfully improving with hydration and also now on treatment for bacteremia.  Hepatitis non-reactive. HIV non-reactive. Hematuria resolved.  Up/c ratio 900 mg/g.  Concurrent transaminitis improving - NS at 75/hr x 24 hours  - Initially with hematuria (now has resolved) and MPO/PR3 still pending    # Enterococcus bacteremia  - on  ampicillin  - work-up per primary team   # Hypotension -normally is hypotensive.  Team was contacted pulm/critical care.   - Improving.  Continue supportive care - abx for bacteremia per primary team   # Cervical decompression - Supportive care per primary team - ? Unclear if source for bacteremia   # CKD stage IIIa - Baseline creatinine appears to be 1.3-1.6  # Hematuria - resolved on repeat.  Complement normal.  ANA negative and MPO/PR3 pending   # Normocytic anemia   - No acute indication for packed red blood cells but follow for need.  Iron a bit low - defer IV.  Oral iron. - some component may be dilutional   Claudia Desanctis, MD 03/22/2020 9:58 AM

## 2020-03-22 NOTE — Plan of Care (Signed)
  Problem: Clinical Measurements: Goal: Diagnostic test results will improve Outcome: Progressing   

## 2020-03-22 NOTE — Progress Notes (Signed)
During the night, the patient complained of severe bilateral knee pain.  Left greater that Right.  Oxy IR 5 mg given at 2223 with minimal relief.  Arby Barrette NP called and made aware.  Order for Morphine 2 mg IV received.  First dose given at [place] with good relief.  We attempted to turn patient during the night.  He is maximum two person to turn and c/o severe pain to neck, upper back, hips, and bilateral knees.  Also, patient unable to feed himself or hold urinal.  His wife assists with feeding him and also with holding the urinal.  We convinced him to try a condom cath and after about two hours, he asked [place] to take it off because he states it is uncomfortable.  Condom Cath removed per patient's request.  Patient slept very little during the night.  He also slept very little the previous night.  At 0050, the patient had a brief burst of SVT at 167.  VS stable and patient is asymptomatic.  [facility] made aware.  Order received for IV Metoprolol 5 mg x 1.  In the interim, the patient's heart rate came down to mid 80s to mid 90s.  [facility] made aware.  Order received for 5 mg IV Metoprolol x 1 if heart rate sustains greater than 120 and SBP is greater than 100.  Will continue to monitor patient.  Earleen Reaper RN

## 2020-03-22 NOTE — Progress Notes (Signed)
Subjective: Today patient is alert, and oriented, sitting up in bed. Reports pain in bilateral knees to be moderate, right worse than left. States he is able to move knees and feet better than he has over the last few weeks but is still very painful. Denies chest pain, SOB, Calf pain. No nausea/vomiting.  Objective:  PE: VITALS:   Vitals:   03/22/20 0527 03/22/20 1139 03/22/20 1634 03/22/20 2047  BP: (!) 117/57 116/61 130/73 134/79  Pulse: 86 88 91 91  Resp: 16 16 18 17   Temp: 98.4 F (36.9 C) 98.6 F (37 C) 100.1 F (37.8 C) 98.4 F (36.9 C)  TempSrc:  Oral Oral Oral  SpO2: 97% 98% 98% 100%  Weight:      Height:       General: Alert, oriented, laying down supine in bed. GI: soft, non-tender RLE: mild effusion of right knee with moderate TTP to touch diffusely. 0-80 degrees of active ROM while laying in the bed. No significant erythema over right knee, not hot to touch. Distal sensation and capillary refill are intact.  LLE: Large effusion of left knee with severe TTP to touch. Grinding felt with any movement of patella. 10-90 degrees of AROM while laying in bed. No significant erythema over left knee, not hot to touch. Visualized arthroscopic knee scars.   LABS  Results for orders placed or performed during the hospital encounter of 03/19/20 (from the past 24 hour(s))  APTT     Status: Abnormal   Collection Time: 03/22/20  1:02 AM  Result Value Ref Range   aPTT 76 (H) 24 - 36 seconds  Procalcitonin     Status: None   Collection Time: 03/22/20  1:02 AM  Result Value Ref Range   Procalcitonin 0.44 ng/mL  Comprehensive metabolic panel     Status: Abnormal   Collection Time: 03/22/20  1:02 AM  Result Value Ref Range   Sodium 133 (L) 135 - 145 mmol/L   Potassium 4.1 3.5 - 5.1 mmol/L   Chloride 101 98 - 111 mmol/L   CO2 22 22 - 32 mmol/L   Glucose, Bld 88 70 - 99 mg/dL   BUN 52 (H) 8 - 23 mg/dL   Creatinine, Ser 2.62 (H) 0.61 - 1.24 mg/dL   Calcium 8.8 (L) 8.9 - 10.3  mg/dL   Total Protein 6.3 (L) 6.5 - 8.1 g/dL   Albumin 1.8 (L) 3.5 - 5.0 g/dL   AST 295 (H) 15 - 41 U/L   ALT 458 (H) 0 - 44 U/L   Alkaline Phosphatase 143 (H) 38 - 126 U/L   Total Bilirubin 4.7 (H) 0.3 - 1.2 mg/dL   GFR calc non Af Amer 24 (L) >60 mL/min   GFR calc Af Amer 28 (L) >60 mL/min   Anion gap 10 5 - 15  CBC with Differential/Platelet     Status: Abnormal   Collection Time: 03/22/20  1:02 AM  Result Value Ref Range   WBC 13.7 (H) 4.0 - 10.5 K/uL   RBC 2.65 (L) 4.22 - 5.81 MIL/uL   Hemoglobin 8.1 (L) 13.0 - 17.0 g/dL   HCT 22.8 (L) 39.0 - 52.0 %   MCV 86.0 80.0 - 100.0 fL   MCH 30.6 26.0 - 34.0 pg   MCHC 35.5 30.0 - 36.0 g/dL   RDW 14.6 11.5 - 15.5 %   Platelets 316 150 - 400 K/uL   nRBC 0.0 0.0 - 0.2 %   Neutrophils Relative % 75 %  Neutro Abs 10.6 (H) 1.7 - 7.7 K/uL   Lymphocytes Relative 10 %   Lymphs Abs 1.3 0.7 - 4.0 K/uL   Monocytes Relative 8 %   Monocytes Absolute 1.2 (H) 0.1 - 1.0 K/uL   Eosinophils Relative 0 %   Eosinophils Absolute 0.0 0.0 - 0.5 K/uL   Basophils Relative 1 %   Basophils Absolute 0.1 0.0 - 0.1 K/uL   Immature Granulocytes 6 %   Abs Immature Granulocytes 0.85 (H) 0.00 - 0.07 K/uL  Heparin level (unfractionated)     Status: Abnormal   Collection Time: 03/22/20  1:02 AM  Result Value Ref Range   Heparin Unfractionated 2.16 (H) 0.30 - 0.70 IU/mL  Glucose, capillary     Status: None   Collection Time: 03/22/20  3:08 AM  Result Value Ref Range   Glucose-Capillary 82 70 - 99 mg/dL  Glucose, capillary     Status: None   Collection Time: 03/22/20  6:40 AM  Result Value Ref Range   Glucose-Capillary 90 70 - 99 mg/dL  Glucose, capillary     Status: Abnormal   Collection Time: 03/22/20 11:38 AM  Result Value Ref Range   Glucose-Capillary 114 (H) 70 - 99 mg/dL  Glucose, capillary     Status: Abnormal   Collection Time: 03/22/20  4:18 PM  Result Value Ref Range   Glucose-Capillary 157 (H) 70 - 99 mg/dL  Gram stain     Status: None    Collection Time: 03/22/20  8:36 PM   Specimen: Synovium; Body Fluid  Result Value Ref Range   Specimen Description SYNOVIAL RIGHT KNEE    Special Requests NONE    Gram Stain      ABUNDANT WBC PRESENT, PREDOMINANTLY PMN NO ORGANISMS SEEN Performed at [facility], [address], [place], [place] [address]    Report Status 03/22/2020 FINAL   Culture, body fluid-bottle     Status: None (Preliminary result)   Collection Time: 03/22/20  8:42 PM   Specimen: Synovium  Result Value Ref Range   Specimen Description SYNOVIAL RIGHT KNEE    Special Requests      BOTTLES DRAWN AEROBIC ONLY Blood Culture adequate volume   Culture PENDING    Report Status PENDING   Glucose, capillary     Status: Abnormal   Collection Time: 03/22/20  8:48 PM  Result Value Ref Range   Glucose-Capillary 205 (H) 70 - 99 mg/dL    DG Knee Left Port  Result Date: 03/21/2020 CLINICAL DATA:  LEFT knee pain and swelling, question septic knee EXAM: PORTABLE LEFT KNEE - 1-2 VIEW COMPARISON:  Portable exam 1146 hours without priors for comparison FINDINGS: Osseous demineralization. Scattered joint space narrowing and chondrocalcinosis. Lateral subluxation of tibia. Large joint effusion. Anterior soft tissue swelling. No acute fracture, dislocation or bone destruction. IMPRESSION: Osseous demineralization with diffuse degenerative changes and question CPPD LEFT knee. Large joint effusion and mild anterior soft tissue swelling without definite acute osseous findings. Septic joint cannot be excluded radiographically; if this is a clinical concern, recommend joint aspiration. Electronically Signed   By: Lavonia Dana M.D.   On: 03/21/2020 13:20   ECHOCARDIOGRAM COMPLETE  Result Date: 03/22/2020    ECHOCARDIOGRAM REPORT   Patient Name:   Johnathan Murray Date of Exam: 03/22/2020 Medical Rec #:  [id number]           Height:       70.0 in Accession #:    [id number]  Weight:       180.6 lb Date of Birth:  Nov 06, 1951            BSA:          1.999 m Patient Age:    69 years            BP:           117/57 mmHg Patient Gender: M                   HR:           86 bpm. Exam Location:  Inpatient Procedure: 2D Echo and Intracardiac Opacification Agent Indications:    Bacteremia 790.7 / R78.81  History:        Patient has prior history of Echocardiogram examinations, most                 recent 01/16/2020. CAD, 23 mm Edwards tissue valve,                 Arrythmias:Atrial Fibrillation; Risk Factors:Hypertension,                 Diabetes and Dyslipidemia.                 Aortic Valve: 23 mm Edwards-SAPIEN valve is present in the                 aortic position.  Sonographer:    Vikki Ports Turrentine Referring Phys: Carlin  1. There is no obvious vegetation, however transaortic gradients across the bioprosthetic valve are increased from the prior study on 01/16/2020, mean gradient 28 mmHg, previously 16 mmHg. Consider TEE for further evaluation.  2. Left ventricular ejection fraction, by estimation, is 55 to 60%. The left ventricle has normal function. The left ventricle has no regional wall motion abnormalities. There is mild concentric left ventricular hypertrophy. Left ventricular diastolic parameters are consistent with Grade I diastolic dysfunction (impaired relaxation). Elevated left atrial pressure.  3. Right ventricular systolic function is normal. The right ventricular size is normal.  4. Left atrial size was mildly dilated.  5. The mitral valve is normal in structure. Mild mitral valve regurgitation. No evidence of mitral stenosis.  6. The aortic valve has been repaired/replaced. Aortic valve regurgitation is not visualized. No aortic stenosis is present. There is a 23 mm Edwards-SAPIEN valve present in the aortic position. Aortic valve mean gradient measures 28.0 mmHg.  7. The inferior vena cava is normal in size with greater than 50% respiratory variability, suggesting right atrial pressure of 3 mmHg.  FINDINGS  Left Ventricle: Left ventricular ejection fraction, by estimation, is 55 to 60%. The left ventricle has normal function. The left ventricle has no regional wall motion abnormalities. Definity contrast agent was given IV to delineate the left ventricular  endocardial borders. The left ventricular internal cavity size was normal in size. There is mild concentric left ventricular hypertrophy. Abnormal (paradoxical) septal motion consistent with post-operative status and abnormal (paradoxical) septal motion, consistent with left bundle branch block. Left ventricular diastolic parameters are consistent with Grade I diastolic dysfunction (impaired relaxation). Elevated left atrial pressure. Right Ventricle: The right ventricular size is normal. No increase in right ventricular wall thickness. Right ventricular systolic function is normal. Left Atrium: Left atrial size was mildly dilated. Right Atrium: Right atrial size was normal in size. Pericardium: There is no evidence of pericardial effusion. Mitral Valve: The mitral valve is normal in structure. Normal mobility  of the mitral valve leaflets. Mild mitral valve regurgitation. No evidence of mitral valve stenosis. Tricuspid Valve: The tricuspid valve is normal in structure. Tricuspid valve regurgitation is mild . No evidence of tricuspid stenosis. Aortic Valve: The aortic valve has been repaired/replaced. Aortic valve regurgitation is not visualized. No aortic stenosis is present. Aortic valve mean gradient measures 28.0 mmHg. Aortic valve peak gradient measures 45.1 mmHg. Aortic valve area, by VTI measures 1.31 cm. There is a 23 mm Edwards-SAPIEN valve present in the aortic position. Pulmonic Valve: The pulmonic valve was normal in structure. Pulmonic valve regurgitation is not visualized. No evidence of pulmonic stenosis. Aorta: The aortic root is normal in size and structure. Venous: The inferior vena cava is normal in size with greater than 50% respiratory  variability, suggesting right atrial pressure of 3 mmHg. IAS/Shunts: No atrial level shunt detected by color flow Doppler.  LEFT VENTRICLE PLAX 2D LVIDd:         4.40 cm  Diastology LVIDs:         3.10 cm  LV e' lateral:   14.00 cm/s LV PW:         1.10 cm  LV E/e' lateral: 8.1 LV IVS:        1.00 cm  LV e' medial:    4.88 cm/s LVOT diam:     1.80 cm  LV E/e' medial:  23.4 LV SV:         70 LV SV Index:   35 LVOT Area:     2.54 cm  LEFT ATRIUM           Index       RIGHT ATRIUM           Index LA diam:      4.00 cm 2.00 cm/m  RA Area:     19.80 cm LA Vol (A4C): 71.6 ml 35.83 ml/m RA Volume:   56.70 ml  28.37 ml/m  AORTIC VALVE AV Area (Vmax):    1.23 cm AV Area (Vmean):   1.17 cm AV Area (VTI):     1.31 cm AV Vmax:           335.60 cm/s AV Vmean:          242.600 cm/s AV VTI:            0.532 m AV Peak Grad:      45.1 mmHg AV Mean Grad:      28.0 mmHg LVOT Vmax:         162.00 cm/s LVOT Vmean:        112.000 cm/s LVOT VTI:          0.273 m LVOT/AV VTI ratio: 0.51  AORTA Ao Root diam: 2.50 cm MITRAL VALVE MV Area (PHT): 3.42 cm     SHUNTS MV Decel Time: 222 msec     Systemic VTI:  0.27 m MV E velocity: 114.00 cm/s  Systemic Diam: 1.80 cm MV A velocity: 95.50 cm/s MV E/A ratio:  1.19 Ena Dawley MD Electronically signed by Ena Dawley MD Signature Date/Time: 03/22/2020/1:01:34 PM    Final     Assessment/Plan: Principal Problem:   Enterococcal bacteremia Active Problems:   Essential hypertension   PAF (paroxysmal atrial fibrillation) (HCC)   Abnormal LFTs (liver function tests)   Cervical myelopathy (HCC)   S/P aortic valve replacement   ARF (acute renal failure) (HCC)   Pseudogout of left knee  Bilateral knee pain L>R in septic patient - left knee aspirated resulting  in positive intracellular calcium pyrophosphate crystals, indicating that knee effusion is likely due to pseudogout.  - Right knee remains swollen and painful, right knee aspiration will be performed as well as an injection  of marcaine and Depo-Medrol into bilateral knees to improve pain and swelling - he can weight bear as tolerated on bilateral lower extremities   Pre-procedure diagnosis: Bilateral knee pain and effusion  Post-procedure Diagnosis: Same Procedure: Right knee aspiration and injection, left knee injection  PROCEDURE DETAILS:  After informed verbal consent was obtained the superolateral portal of the right knee was prepped with chlorhexidine. Approximately 3 mL of marcaine were injected superficially and then into the deeper tissues and into the joint space. Medicine was left to sit for several minutes to take effect. Next, a 30 mL syringe with an 18-gauge needle was used to aspirate approximately 30 ml of synovial fluid. 40 mg of Depo-Medrol and 2cc's marcaine were then injected into the joint.  He tolerated this well without complication and a Band-Aid was placed. The fluid itself appeared was of normal color and consistency.  It was sent for Gram stain culture and sensitivity and analysis. The superolateral portal of the left knee was then prepped with chlorhexidine. 40 mg of Depo-Medrol and 2cc's marcaine were then injected into the joint. He tolerated this well without complication and a Band-Aid was placed.   Contact information:   Weekdays 8-5 [address], [place] [phone] A fter hours and holidays please check [email or web address] for group call information for [facility] [place] 03/22/2020, 10:33 PM

## 2020-03-22 NOTE — Progress Notes (Signed)
PROGRESS NOTE  Johnathan Murray [id number] DOB: 05/02/1951 DOA: 03/19/2020 PCP: Nicoletta Dress, MD  HPI/Recap of past 24 hours: HPI from Dr Julio Sicks is a 69 y.o. male with history of paroxysmal atrial fibrillation, hypertension, bioprosthetic aortic valve replacement who had undergone recent cervical discectomy and fusion about 3 weeks ago done by Dr. Trenton Gammon has been feeling weak with increasing pain in the neck and low back and poor appetite.  Denies any vomiting or diarrhea denies taking any Tylenol but does take NSAIDs sometimes.  Has not noticed any blood in the urine and has been urinating less last few days.  Has subjective feeling of fever chills.  Given the symptoms patient presents to the ER.  Patient states over the last 3 weeks and surgery patient has been finding it difficult to ambulate because of the weakness of the lower extremity.  Denies any incontinence of urine or bowel.  Has been having more lower extremity edema more on the left side. In the ER on exam patient appears generally weak with weakness of the lower extremity.  There is edema of the lower extremity on the left side.  Labs show creatinine of 6.4 which is increased from 1.6 about 3 weeks ago.  In addition patient's LFTs are markedly elevated at AST of 415 and ALT of 429 patient's albumin is 2.6 total bilirubin 5.5 bicarb 19 sodium 130 calcium 10.2 hemoglobin 10.4 lactic acid 1.6 WBC 12.6 platelets 341 urinalysis shows RBCs of 21-50 WBC 21-50 protein around 100 bacteria rare hyaline cast present mucus present. Patient was given fluid bolus and admitted for acute renal failure.  Patient also had CT abdomen pelvis and CT cervical spine which did not show any acute. USS of the abdomen also was unremarkable.  Covid test is negative.      Today, patient still complaining of left knee pain also some right knee pain, with some neck and back pain, also complained of poor appetite.  Patient denies any  chest pain, abdominal pain, nausea/vomiting, fever/chills.      Assessment/Plan: Principal Problem:   Enterococcal bacteremia Active Problems:   Essential hypertension   PAF (paroxysmal atrial fibrillation) (HCC)   Abnormal LFTs (liver function tests)   Cervical myelopathy (HCC)   S/P aortic valve replacement   ARF (acute renal failure) (HCC)   Pseudogout of left knee   AKI on CKD stage III/anion gap metabolic acidosis Multifactorial from bacteremia, poor oral intake, in addition to medication induced from NSAID, Lasix and lisinopril use Baseline creatinine 1.3-1.6, on presentation 6.40 Noted urine with RBCs and protein-further work-up Per nephrology Continue IV fluids Hold patient's Lasix, lisinopril CT abdomen does not show any obstruction Nephrology consulted, appreciate recommendations Daily BMP  Sepsis likely 2/2 Enterococcus faecalis bacteremia On presentation, patient was hypotensive, with leukocytosis Source from possible knee infection vs recent postoperative cervical/lumbar infection Afebrile, with leukocytosis (likely reactive from knee joint aspiration)  BC x2 growing Enterococcus faecalis UC no growth, Chest x-ray unremarkable Lactic acid, TSH WNL, cortisol WNL Procalcitonin trending down TTE showed no obvious vegetation but increased transaortic gradients across the bioprosthetic valve (unsure if patient will be able to get TEE with recent C-spine surgery) ID on board, continue IV ampicillin, add on ceftriaxone Continue IV fluids PCCM consulted due to hypotension, appreciate recs Monitor closely  Ruptured Baker's cyst LLE/bilateral knee pain in septic patient, possibly pseudogout Doppler showed ruptured Baker's cyst on LLE Left knee x-ray showed large joint effusion, septic joint cannot be excluded  Orthopedics consulted s/p joint aspiration on 03/21/2020 showed positive intracellular calcium pyrophosphate crystals, WBC over 15,000, culture pending Orthopedics  following patient, may need right knee aspiration as well  Transaminitis ?? Likely 2/2 above sepsis/hypotension/shock liver INR WNL Acute hepatitis panel nonreactive Continue IV hydration Hold home amiodarone Daily CMP  Paroxysmal A. Fib/bioprosthetic aortic valve replacement Heart rate WNL Hold home amiodarone TTE as above Switch to IV Heparin, continue to hold home Eliquis for now due to transaminitis and the need for any procedure Telemetry  Neck and back pain History of recent cervical decompression surgery Imaging does not show anything acute as mentioned above Pain management, if pain worsens/persists we will consult Dr. Trenton Gammon patient's neurosurgeon PT/OT  Normocytic anemia/postop anemia Hemoglobin dropped to 8.5, no obvious signs of bleeding Anemia panel iron 43, sats 29, ferritin 3,811 Daily CBC  Hypothyroidism TSH levels WNL Continue Synthroid       Malnutrition Type:      Malnutrition Characteristics:      Nutrition Interventions:       Estimated body mass index is 25.91 kg/m as calculated from the following:   Height as of this encounter: 5\' 10"  (1.778 m).   Weight as of this encounter: 81.9 kg.      Code Status: Full  Family Communication: Discussed extensively with wife at bedside  Disposition Plan: Status is: Inpatient  Remains inpatient appropriate because:Hemodynamically unstable and Inpatient level of care appropriate due to severity of illness   Dispo: The patient is from: Home              Anticipated d/c is to: To be determined              Anticipated d/c date is: To be determined              Patient currently is not medically stable to d/c.-Due to patient requiring further  work-up, consultant signing off     Consultants:  Nephrology  PCCM  Orthopedics  Procedures:  None  Antimicrobials:  Ampicillin  Ceftriaxone  DVT prophylaxis: IV Heparin   Objective: Vitals:   03/22/20 0103 03/22/20 0308  03/22/20 0527 03/22/20 1139  BP: (!) 125/56 119/74 (!) 117/57 116/61  Pulse: (!) 57 81 86 88  Resp: 17 18 16 16   Temp:  98.3 F (36.8 C) 98.4 F (36.9 C) 98.6 F (37 C)  TempSrc:    Oral  SpO2: 96% 99% 97% 98%  Weight:      Height:        Intake/Output Summary (Last 24 hours) at 03/22/2020 1514 Last data filed at 03/22/2020 1300 Gross per 24 hour  Intake 2419.82 ml  Output 3145 ml  Net -725.18 ml   Filed Weights   03/20/20 0500 03/21/20 0515  Weight: 86.2 kg 81.9 kg    Exam:  General: NAD, deconditioned  Cardiovascular: S1, S2 present  Respiratory: CTAB  Abdomen: Soft, nontender, nondistended, bowel sounds present  Musculoskeletal: Trace pedal edema noted on the RLE, +1 noted on the LLE, with tender and swollen left knee joint  Skin: Normal  Psychiatry: Normal mood  Neurology: No obvious neurologic deficits noted, although generalized weakness noted    Data Reviewed: CBC: Recent Labs  Lab 03/19/20 1801 03/20/20 0331 03/21/20 0234 03/22/20 0102  WBC 12.6* 13.1* 9.4 13.7*  NEUTROABS 10.3* 10.5* 6.9 10.6*  HGB 10.4* 8.5* 8.2* 8.1*  HCT 31.2* 24.8* 23.5* 22.8*  MCV 89.7 87.0 87.0 86.0  PLT 341 304 305 [id number]   Basic Metabolic  Panel: Recent Labs  Lab 03/19/20 1801 03/20/20 0331 03/20/20 1517 03/21/20 0234 03/22/20 0102  NA 130* 133* 133* 131* 133*  K 4.1 4.2 4.1 3.9 4.1  CL 94* 98 99 99 101  CO2 19* 16* 18* 20* 22  GLUCOSE 134* 71 72 80 88  BUN 98* 106* 94* 78* 52*  CREATININE 6.40* 6.53* 5.15* 4.26* 2.62*  CALCIUM 10.2 9.2 9.0 8.8* 8.8*   GFR: Estimated Creatinine Clearance: 28.2 mL/min (A) (by C-G formula based on SCr of 2.62 mg/dL (H)). Liver Function Tests: Recent Labs  Lab 03/19/20 1801 03/20/20 0331 03/21/20 0234 03/22/20 0102  AST 415* 227* 648* 295*  ALT 429* 302* 564* 458*  ALKPHOS 158* 127* 153* 143*  BILITOT 5.5* 4.8* 5.3* 4.7*  PROT 7.8 6.5 6.1* 6.3*  ALBUMIN 2.6* 2.1* 1.9* 1.8*   No results for input(s): LIPASE, AMYLASE in  the last 168 hours. No results for input(s): AMMONIA in the last 168 hours. Coagulation Profile: Recent Labs  Lab 03/20/20 0331  INR 1.8*   Cardiac Enzymes: Recent Labs  Lab 03/19/20 1801 03/20/20 0331  CKTOTAL 20* 54   BNP (last 3 results) No results for input(s): PROBNP in the last 8760 hours. HbA1C: No results for input(s): HGBA1C in the last 72 hours. CBG: Recent Labs  Lab 03/21/20 1810 03/21/20 2045 03/22/20 0308 03/22/20 0640 03/22/20 1138  GLUCAP 96 107* 82 90 114*   Lipid Profile: No results for input(s): CHOL, HDL, LDLCALC, TRIG, CHOLHDL, LDLDIRECT in the last 72 hours. Thyroid Function Tests: Recent Labs    03/20/20 0855  TSH 1.264   Anemia Panel: Recent Labs    03/20/20 1517  VITAMINB12 3,628*  FOLATE 11.7  FERRITIN 3,811*  TIBC 150*  IRON 43*   Urine analysis:    Component Value Date/Time   COLORURINE AMBER (A) 03/20/2020 1001   APPEARANCEUR HAZY (A) 03/20/2020 1001   LABSPEC 1.013 03/20/2020 1001   PHURINE 5.0 03/20/2020 1001   GLUCOSEU NEGATIVE 03/20/2020 1001   HGBUR SMALL (A) 03/20/2020 1001   BILIRUBINUR NEGATIVE 03/20/2020 1001   KETONESUR NEGATIVE 03/20/2020 1001   PROTEINUR 30 (A) 03/20/2020 1001   NITRITE NEGATIVE 03/20/2020 1001   LEUKOCYTESUR NEGATIVE 03/20/2020 1001   Sepsis Labs: @LABRCNTIP (procalcitonin:4,lacticidven:4)  ) Recent Results (from the past 240 hour(s))  Respiratory Panel by RT PCR (Flu A&B, Covid) - Nasopharyngeal Swab     Status: None   Collection Time: 03/19/20 10:22 PM   Specimen: Nasopharyngeal Swab  Result Value Ref Range Status   SARS Coronavirus 2 by RT PCR NEGATIVE NEGATIVE Final    Comment: (NOTE) SARS-CoV-2 target nucleic acids are NOT DETECTED. The SARS-CoV-2 RNA is generally detectable in upper respiratoy specimens during the acute phase of infection. The lowest concentration of SARS-CoV-2 viral copies this assay can detect is 131 copies/mL. A negative result does not preclude  SARS-Cov-2 infection and should not be used as the sole basis for treatment or other patient management decisions. A negative result may occur with  improper specimen collection/handling, submission of specimen other than nasopharyngeal swab, presence of viral mutation(s) within the areas targeted by this assay, and inadequate number of viral copies (<131 copies/mL). A negative result must be combined with clinical observations, patient history, and epidemiological information. The expected result is Negative. Fact Sheet for Patients:  PinkCheek.be Fact Sheet for Healthcare Providers:  GravelBags.it This test is not yet ap proved or cleared by the [place] FDA and  has been authorized for detection and/or diagnosis of SARS-CoV-2  by FDA under an Emergency Use Authorization (EUA). This EUA will remain  in effect (meaning this test can be used) for the duration of the COVID-19 declaration under Section 564(b)(1) of the Act, 21 U.S.C. section 360bbb-3(b)(1), unless the authorization is terminated or revoked sooner.    Influenza A by PCR NEGATIVE NEGATIVE Final   Influenza B by PCR NEGATIVE NEGATIVE Final    Comment: (NOTE) The Xpert Xpress SARS-CoV-2/FLU/RSV assay is intended as an aid in  the diagnosis of influenza from Nasopharyngeal swab specimens and  should not be used as a sole basis for treatment. Nasal washings and  aspirates are unacceptable for Xpert Xpress SARS-CoV-2/FLU/RSV  testing. Fact Sheet for Patients: PinkCheek.be Fact Sheet for Healthcare Providers: GravelBags.it This test is not yet approved or cleared by the [place] FDA and  has been authorized for detection and/or diagnosis of SARS-CoV-2 by  FDA under an Emergency Use Authorization (EUA). This EUA will remain  in effect (meaning this test can be used) for the duration of the  Covid-19  declaration under Section 564(b)(1) of the Act, 21  U.S.C. section 360bbb-3(b)(1), unless the authorization is  terminated or revoked. Performed at [facility], [place] [address] [place], [place] [address]   Urine culture     Status: None   Collection Time: 03/19/20 10:39 PM   Specimen: Urine, Random  Result Value Ref Range Status   Specimen Description URINE, RANDOM  Final   Special Requests NONE  Final   Culture   Final    NO GROWTH Performed at [facility], [place] [address], [place], [place] [address]    Report Status 03/20/2020 FINAL  Final  Culture, blood (routine x 2)     Status: Abnormal   Collection Time: 03/20/20  7:45 AM   Specimen: BLOOD RIGHT FOREARM  Result Value Ref Range Status   Specimen Description BLOOD RIGHT FOREARM  Final   Special Requests   Final    BOTTLES DRAWN AEROBIC ONLY Blood Culture results may not be optimal due to an inadequate volume of blood received in culture bottles   Culture  Setup Time   Final    AEROBIC BOTTLE ONLY GRAM POSITIVE COCCI CRITICAL RESULT CALLED TO, READ BACK BY AND VERIFIED WITH: K AMEND [facility] 03/21/20 0059 JDW Performed at [facility], [place] [address] [place], [place] [address]    Culture ENTEROCOCCUS FAECALIS (A)  Final   Report Status 03/22/2020 FINAL  Final   Organism ID, Bacteria ENTEROCOCCUS FAECALIS  Final      Susceptibility   Enterococcus faecalis - MIC*    AMPICILLIN <=2 SENSITIVE Sensitive     VANCOMYCIN 1 SENSITIVE Sensitive     GENTAMICIN SYNERGY SENSITIVE Sensitive     * ENTEROCOCCUS FAECALIS  Blood Culture ID Panel (Reflexed)     Status: Abnormal   Collection Time: 03/20/20  7:45 AM  Result Value Ref Range Status   Enterococcus species DETECTED (A) NOT DETECTED Final    Comment: CRITICAL RESULT CALLED TO, READ BACK BY AND VERIFIED WITH: K AMEND PHARMD 03/21/20 0059 JDW    Vancomycin resistance NOT DETECTED NOT DETECTED Final   Listeria monocytogenes NOT DETECTED NOT DETECTED Final    Staphylococcus species NOT DETECTED NOT DETECTED Final   Staphylococcus aureus (BCID) NOT DETECTED NOT DETECTED Final   Streptococcus species NOT DETECTED NOT DETECTED Final   Streptococcus agalactiae NOT DETECTED NOT DETECTED Final   Streptococcus pneumoniae NOT DETECTED NOT DETECTED Final  Streptococcus pyogenes NOT DETECTED NOT DETECTED Final   Acinetobacter baumannii NOT DETECTED NOT DETECTED Final   Enterobacteriaceae species NOT DETECTED NOT DETECTED Final   Enterobacter cloacae complex NOT DETECTED NOT DETECTED Final   Escherichia coli NOT DETECTED NOT DETECTED Final   Klebsiella oxytoca NOT DETECTED NOT DETECTED Final   Klebsiella pneumoniae NOT DETECTED NOT DETECTED Final   Proteus species NOT DETECTED NOT DETECTED Final   Serratia marcescens NOT DETECTED NOT DETECTED Final   Haemophilus influenzae NOT DETECTED NOT DETECTED Final   Neisseria meningitidis NOT DETECTED NOT DETECTED Final   Pseudomonas aeruginosa NOT DETECTED NOT DETECTED Final   Candida albicans NOT DETECTED NOT DETECTED Final   Candida glabrata NOT DETECTED NOT DETECTED Final   Candida krusei NOT DETECTED NOT DETECTED Final   Candida parapsilosis NOT DETECTED NOT DETECTED Final   Candida tropicalis NOT DETECTED NOT DETECTED Final    Comment: Performed at [facility], [place] [address], [place], [place] [address]  Culture, blood (routine x 2)     Status: Abnormal   Collection Time: 03/20/20  8:55 AM   Specimen: BLOOD  Result Value Ref Range Status   Specimen Description BLOOD LEFT ANTECUBITAL  Final   Special Requests   Final    BOTTLES DRAWN AEROBIC AND ANAEROBIC Blood Culture results may not be optimal due to an inadequate volume of blood received in culture bottles   Culture  Setup Time   Final    IN BOTH AEROBIC AND ANAEROBIC BOTTLES GRAM POSITIVE COCCI CRITICAL VALUE NOTED.  VALUE IS CONSISTENT WITH PREVIOUSLY REPORTED AND CALLED VALUE.    Culture (A)  Final    ENTEROCOCCUS  FAECALIS SUSCEPTIBILITIES PERFORMED ON PREVIOUS CULTURE WITHIN THE LAST 5 DAYS. Performed at [facility], [place] [address], [place], [place] [address]    Report Status 03/22/2020 FINAL  Final  Body fluid culture     Status: None (Preliminary result)   Collection Time: 03/21/20  4:42 PM   Specimen: Body Fluid  Result Value Ref Range Status   Specimen Description FLUID SYNOVIAL LEFT KNEE  Final   Special Requests SYRINGE  Final   Gram Stain   Final    FEW WBC PRESENT,BOTH PMN AND MONONUCLEAR NO ORGANISMS SEEN    Culture   Final    NO GROWTH < 12 HOURS Performed at [facility], [place] [address], [place],  [address]    Report Status PENDING  Incomplete      Studies: ECHOCARDIOGRAM COMPLETE  Result Date: 03/22/2020    ECHOCARDIOGRAM REPORT   Patient Name:   VERAL CIMO Grawe Date of Exam: 03/22/2020 Medical Rec #:  [id number]           Height:       70.0 in Accession #:    [id number]          Weight:       180.6 lb Date of Birth:  1951-04-29           BSA:          1.999 m Patient Age:    38 years            BP:           117/57 mmHg Patient Gender: M                   HR:           86 bpm. Exam Location:  Inpatient Procedure: 2D Echo  and Intracardiac Opacification Agent Indications:    Bacteremia 790.7 / R78.81  History:        Patient has prior history of Echocardiogram examinations, most                 recent 01/16/2020. CAD, 23 mm Edwards tissue valve,                 Arrythmias:Atrial Fibrillation; Risk Factors:Hypertension,                 Diabetes and Dyslipidemia.                 Aortic Valve: 23 mm Edwards-SAPIEN valve is present in the                 aortic position.  Sonographer:    Vikki Ports Turrentine Referring Phys: [place]  1. There is no obvious vegetation, however transaortic gradients across the bioprosthetic valve are increased from the prior study on 01/16/2020, mean gradient 28 mmHg, previously 16 mmHg. Consider TEE for further  evaluation.  2. Left ventricular ejection fraction, by estimation, is 55 to 60%. The left ventricle has normal function. The left ventricle has no regional wall motion abnormalities. There is mild concentric left ventricular hypertrophy. Left ventricular diastolic parameters are consistent with Grade I diastolic dysfunction (impaired relaxation). Elevated left atrial pressure.  3. Right ventricular systolic function is normal. The right ventricular size is normal.  4. Left atrial size was mildly dilated.  5. The mitral valve is normal in structure. Mild mitral valve regurgitation. No evidence of mitral stenosis.  6. The aortic valve has been repaired/replaced. Aortic valve regurgitation is not visualized. No aortic stenosis is present. There is a 23 mm Edwards-SAPIEN valve present in the aortic position. Aortic valve mean gradient measures 28.0 mmHg.  7. The inferior vena cava is normal in size with greater than 50% respiratory variability, suggesting right atrial pressure of 3 mmHg. FINDINGS  Left Ventricle: Left ventricular ejection fraction, by estimation, is 55 to 60%. The left ventricle has normal function. The left ventricle has no regional wall motion abnormalities. Definity contrast agent was given IV to delineate the left ventricular  endocardial borders. The left ventricular internal cavity size was normal in size. There is mild concentric left ventricular hypertrophy. Abnormal (paradoxical) septal motion consistent with post-operative status and abnormal (paradoxical) septal motion, consistent with left bundle branch block. Left ventricular diastolic parameters are consistent with Grade I diastolic dysfunction (impaired relaxation). Elevated left atrial pressure. Right Ventricle: The right ventricular size is normal. No increase in right ventricular wall thickness. Right ventricular systolic function is normal. Left Atrium: Left atrial size was mildly dilated. Right Atrium: Right atrial size was normal in  size. Pericardium: There is no evidence of pericardial effusion. Mitral Valve: The mitral valve is normal in structure. Normal mobility of the mitral valve leaflets. Mild mitral valve regurgitation. No evidence of mitral valve stenosis. Tricuspid Valve: The tricuspid valve is normal in structure. Tricuspid valve regurgitation is mild . No evidence of tricuspid stenosis. Aortic Valve: The aortic valve has been repaired/replaced. Aortic valve regurgitation is not visualized. No aortic stenosis is present. Aortic valve mean gradient measures 28.0 mmHg. Aortic valve peak gradient measures 45.1 mmHg. Aortic valve area, by VTI measures 1.31 cm. There is a 23 mm Edwards-SAPIEN valve present in the aortic position. Pulmonic Valve: The pulmonic valve was normal in structure. Pulmonic valve regurgitation is not visualized. No evidence of pulmonic stenosis.  Aorta: The aortic root is normal in size and structure. Venous: The inferior vena cava is normal in size with greater than 50% respiratory variability, suggesting right atrial pressure of 3 mmHg. IAS/Shunts: No atrial level shunt detected by color flow Doppler.  LEFT VENTRICLE PLAX 2D LVIDd:         4.40 cm  Diastology LVIDs:         3.10 cm  LV e' lateral:   14.00 cm/s LV PW:         1.10 cm  LV E/e' lateral: 8.1 LV IVS:        1.00 cm  LV e' medial:    4.88 cm/s LVOT diam:     1.80 cm  LV E/e' medial:  23.4 LV SV:         70 LV SV Index:   35 LVOT Area:     2.54 cm  LEFT ATRIUM           Index       RIGHT ATRIUM           Index LA diam:      4.00 cm 2.00 cm/m  RA Area:     19.80 cm LA Vol (A4C): 71.6 ml 35.83 ml/m RA Volume:   56.70 ml  28.37 ml/m  AORTIC VALVE AV Area (Vmax):    1.23 cm AV Area (Vmean):   1.17 cm AV Area (VTI):     1.31 cm AV Vmax:           335.60 cm/s AV Vmean:          242.600 cm/s AV VTI:            0.532 m AV Peak Grad:      45.1 mmHg AV Mean Grad:      28.0 mmHg LVOT Vmax:         162.00 cm/s LVOT Vmean:        112.000 cm/s LVOT VTI:           0.273 m LVOT/AV VTI ratio: 0.51  AORTA Ao Root diam: 2.50 cm MITRAL VALVE MV Area (PHT): 3.42 cm     SHUNTS MV Decel Time: 222 msec     Systemic VTI:  0.27 m MV E velocity: 114.00 cm/s  Systemic Diam: 1.80 cm MV A velocity: 95.50 cm/s MV E/A ratio:  1.19 Ena Dawley MD Electronically signed by Ena Dawley MD Signature Date/Time: 03/22/2020/1:01:34 PM    Final     Scheduled Meds: . insulin aspart  0-6 Units Subcutaneous TID WC  . iron polysaccharides  150 mg Oral Q breakfast  . levothyroxine  25 mcg Oral Q0600  . midodrine  10 mg Oral TID WC  . sodium chloride flush  3 mL Intravenous Once    Continuous Infusions: . sodium chloride 75 mL/hr at 03/22/20 1136  . ampicillin (OMNIPEN) IV 2 g (03/22/20 1104)  . cefTRIAXone (ROCEPHIN)  IV    . heparin 1,600 Units/hr (03/22/20 0848)     LOS: 2 days     Alma Friendly, MD [facility]  If 7PM-7AM, please contact night-coverage [email or web address] 03/22/2020, 3:14 PM

## 2020-03-22 NOTE — Progress Notes (Signed)
ANTICOAGULATION CONSULT NOTE Follow Up Consult  Pharmacy Consult for Heparin Indication: atrial fibrillation  Assessment: 65 YOM admitted for further workup of AKI and elevated LFTs. Pt has hx of a fib, for which he takes Eliquis at home; pharmacy was consulted to transition pt from Eliquis to IV heparin; last dose of Eliquis was taken on 4/29).  aPTT 76 sec this am  Pt had recent back surgery (cervical discectomy, fusion) ~3 weeks ago (reporting neck and back pain) and mild hematuria in ED, so using lower anticoagulation goal ranges at this time (see below).  Goal of Therapy:  Heparin level: 0.3-0.5 units/ml  aPTT 66-85 sec Monitor platelets by anticoagulation protocol: Yes   Plan:  - Continue Heparin at 1600 units/hr (16 ml/hr) -daily heparin level and aPTT  Thanks for allowing pharmacy to be a part of this patient's care.  Excell Seltzer, PharmD Clinical Pharmacist

## 2020-03-22 NOTE — Progress Notes (Signed)
  Echocardiogram 2D Echocardiogram has been performed.  Johnathan Murray A Nyan Dufresne 03/22/2020, 10:47 AM

## 2020-03-22 NOTE — Progress Notes (Signed)
PHARMACY NOTE:  ANTIMICROBIAL RENAL DOSAGE ADJUSTMENT  Current antimicrobial regimen includes a mismatch between antimicrobial dosage and estimated renal function.  As per policy approved by the Pharmacy & Therapeutics and Medical Executive Committees, the antimicrobial dosage will be adjusted accordingly.  Current antimicrobial dosage:  Ampicillin 2g IV every 8 hours  Indication: Enterococcus faecalis bacteremia  Renal Function:  Estimated Creatinine Clearance: 28.2 mL/min (A) (by C-G formula based on SCr of 2.62 mg/dL (H)). []      On intermittent HD, scheduled: []      On CRRT    Antimicrobial dosage has been changed to:  Ampicillin 2g IV every 6 hours  Additional comments:  CrCl estimated to be >/=30 ml/min with quickly resolving AKI   Thank you for allowing pharmacy to be a part of this patient's care.  Alycia Rossetti, PharmD, BCPS Clinical Pharmacist Clinical phone for 03/22/2020: [phone] 03/22/2020 9:26 AM   **Pharmacist phone directory can now be found on [email or web address] (PW [email or web address]).  Listed under [place].

## 2020-03-22 NOTE — Progress Notes (Signed)
Subjective: No new complaints   Antibiotics:  Anti-infectives (From admission, onward)   Start     Dose/Rate Route Frequency Ordered Stop   03/22/20 1445  cefTRIAXone (ROCEPHIN) 2 g in sodium chloride 0.9 % 100 mL IVPB     2 g 200 mL/hr over 30 Minutes Intravenous Every 12 hours 03/22/20 1442     03/22/20 1000  ampicillin (OMNIPEN) 2 g in sodium chloride 0.9 % 100 mL IVPB     2 g 300 mL/hr over 20 Minutes Intravenous Every 6 hours 03/22/20 0925     03/21/20 1800  ceFEPIme (MAXIPIME) 1 g in sodium chloride 0.9 % 100 mL IVPB  Status:  Discontinued     1 g 200 mL/hr over 30 Minutes Intravenous Every 24 hours 03/20/20 1143 03/21/20 0116   03/21/20 0200  ampicillin (OMNIPEN) 2 g in sodium chloride 0.9 % 100 mL IVPB  Status:  Discontinued     2 g 300 mL/hr over 20 Minutes Intravenous Every 8 hours 03/21/20 0116 03/22/20 0925   03/20/20 1230  ceFEPIme (MAXIPIME) 1 g in sodium chloride 0.9 % 100 mL IVPB     1 g 200 mL/hr over 30 Minutes Intravenous  Once 03/20/20 1143 03/20/20 1358      Medications: Scheduled Meds: . insulin aspart  0-6 Units Subcutaneous TID WC  . iron polysaccharides  150 mg Oral Q breakfast  . levothyroxine  25 mcg Oral Q0600  . midodrine  10 mg Oral TID WC  . sodium chloride flush  3 mL Intravenous Once   Continuous Infusions: . sodium chloride 75 mL/hr at 03/22/20 1136  . ampicillin (OMNIPEN) IV 2 g (03/22/20 1104)  . cefTRIAXone (ROCEPHIN)  IV    . heparin 1,600 Units/hr (03/22/20 0848)   PRN Meds:.hydrALAZINE, metoprolol tartrate, morphine injection, oxyCODONE    Objective: Weight change:   Intake/Output Summary (Last 24 hours) at 03/22/2020 1442 Last data filed at 03/22/2020 1300 Gross per 24 hour  Intake 2419.82 ml  Output 3145 ml  Net -725.18 ml   Blood pressure 116/61, pulse 88, temperature 98.6 F (37 C), temperature source Oral, resp. rate 16, height 5\' 10"  (1.778 m), weight 81.9 kg, SpO2 98 %. Temp:  [98.3 F (36.8 C)-99.5 F  (37.5 C)] 98.6 F (37 C) (05/02 1139) Pulse Rate:  [48-88] 88 (05/02 1139) Resp:  [16-18] 16 (05/02 1139) BP: (116-125)/(56-74) 116/61 (05/02 1139) SpO2:  [96 %-100 %] 98 % (05/02 1139)  Physical Exam: General: Alert and awake, oriented x3, not in any acute distress. HEENT: anicteric sclera, EOMI CVS regular rate, normal systolic murmur Chest: , no wheezing, no respiratory distress lungs clear Abdomen: soft non-distended,  Extremities: bilateral knee effusions and edema Skin: no rashes Neuro: nonfocal  CBC:    BMET Recent Labs    03/21/20 0234 03/22/20 0102  NA 131* 133*  K 3.9 4.1  CL 99 101  CO2 20* 22  GLUCOSE 80 88  BUN 78* 52*  CREATININE 4.26* 2.62*  CALCIUM 8.8* 8.8*     Liver Panel  Recent Labs    03/20/20 0331 03/20/20 0331 03/21/20 0234 03/22/20 0102  PROT 6.5   < > 6.1* 6.3*  ALBUMIN 2.1*   < > 1.9* 1.8*  AST 227*   < > 648* 295*  ALT 302*   < > 564* 458*  ALKPHOS 127*   < > 153* 143*  BILITOT 4.8*   < > 5.3* 4.7*  BILIDIR 2.7*  --   --   --  IBILI 2.1*  --   --   --    < > = values in this interval not displayed.       Sedimentation Rate No results for input(s): ESRSEDRATE in the last 72 hours. C-Reactive Protein No results for input(s): CRP in the last 72 hours.  Micro Results: Recent Results (from the past 720 hour(s))  Surgical pcr screen     Status: Abnormal   Collection Time: 02/24/20 12:14 PM   Specimen: Nasal Mucosa; Nasal Swab  Result Value Ref Range Status   MRSA, PCR NEGATIVE NEGATIVE Final   Staphylococcus aureus POSITIVE (A) NEGATIVE Final    Comment: (NOTE) The Xpert SA Assay (FDA approved for NASAL specimens in patients 3 years of age and older), is one component of a comprehensive surveillance program. It is not intended to diagnose infection nor to guide or monitor treatment. Performed at [facility], [place] [address] [place], [place] [address]   SARS CORONAVIRUS 2 (TAT 6-24 HRS) Nasopharyngeal  Nasopharyngeal Swab     Status: None   Collection Time: 02/28/20 10:54 AM   Specimen: Nasopharyngeal Swab  Result Value Ref Range Status   SARS Coronavirus 2 NEGATIVE NEGATIVE Final    Comment: (NOTE) SARS-CoV-2 target nucleic acids are NOT DETECTED. The SARS-CoV-2 RNA is generally detectable in upper and lower respiratory specimens during the acute phase of infection. Negative results do not preclude SARS-CoV-2 infection, do not rule out co-infections with other pathogens, and should not be used as the sole basis for treatment or other patient management decisions. Negative results must be combined with clinical observations, patient history, and epidemiological information. The expected result is Negative. Fact Sheet for Patients: SugarRoll.be Fact Sheet for Healthcare Providers: https://www.woods-mathews.com/ This test is not yet approved or cleared by the [place] FDA and  has been authorized for detection and/or diagnosis of SARS-CoV-2 by FDA under an Emergency Use Authorization (EUA). This EUA will remain  in effect (meaning this test can be used) for the duration of the COVID-19 declaration under Section 56 4(b)(1) of the Act, 21 U.S.C. section 360bbb-3(b)(1), unless the authorization is terminated or revoked sooner. Performed at [facility], [place] [address] [place], [place] [address]   Respiratory Panel by RT PCR (Flu A&B, Covid) - Nasopharyngeal Swab     Status: None   Collection Time: 03/19/20 10:22 PM   Specimen: Nasopharyngeal Swab  Result Value Ref Range Status   SARS Coronavirus 2 by RT PCR NEGATIVE NEGATIVE Final    Comment: (NOTE) SARS-CoV-2 target nucleic acids are NOT DETECTED. The SARS-CoV-2 RNA is generally detectable in upper respiratoy specimens during the acute phase of infection. The lowest concentration of SARS-CoV-2 viral copies this assay can detect is 131 copies/mL. A negative result does not  preclude SARS-Cov-2 infection and should not be used as the sole basis for treatment or other patient management decisions. A negative result may occur with  improper specimen collection/handling, submission of specimen other than nasopharyngeal swab, presence of viral mutation(s) within the areas targeted by this assay, and inadequate number of viral copies (<131 copies/mL). A negative result must be combined with clinical observations, patient history, and epidemiological information. The expected result is Negative. Fact Sheet for Patients:  PinkCheek.be Fact Sheet for Healthcare Providers:  GravelBags.it This test is not yet ap proved or cleared by the [place] FDA and  has been authorized for detection and/or diagnosis of SARS-CoV-2 by FDA under an Emergency Use Authorization (EUA). This EUA will  remain  in effect (meaning this test can be used) for the duration of the COVID-19 declaration under Section 564(b)(1) of the Act, 21 U.S.C. section 360bbb-3(b)(1), unless the authorization is terminated or revoked sooner.    Influenza A by PCR NEGATIVE NEGATIVE Final   Influenza B by PCR NEGATIVE NEGATIVE Final    Comment: (NOTE) The Xpert Xpress SARS-CoV-2/FLU/RSV assay is intended as an aid in  the diagnosis of influenza from Nasopharyngeal swab specimens and  should not be used as a sole basis for treatment. Nasal washings and  aspirates are unacceptable for Xpert Xpress SARS-CoV-2/FLU/RSV  testing. Fact Sheet for Patients: PinkCheek.be Fact Sheet for Healthcare Providers: GravelBags.it This test is not yet approved or cleared by the [place] FDA and  has been authorized for detection and/or diagnosis of SARS-CoV-2 by  FDA under an Emergency Use Authorization (EUA). This EUA will remain  in effect (meaning this test can be used) for the duration of the    Covid-19 declaration under Section 564(b)(1) of the Act, 21  U.S.C. section 360bbb-3(b)(1), unless the authorization is  terminated or revoked. Performed at [facility], [place] [address] [place], [place] [address]   Urine culture     Status: None   Collection Time: 03/19/20 10:39 PM   Specimen: Urine, Random  Result Value Ref Range Status   Specimen Description URINE, RANDOM  Final   Special Requests NONE  Final   Culture   Final    NO GROWTH Performed at [facility], [place] [address] [place], [place] [address]    Report Status 03/20/2020 FINAL  Final  Culture, blood (routine x 2)     Status: Abnormal   Collection Time: 03/20/20  7:45 AM   Specimen: BLOOD RIGHT FOREARM  Result Value Ref Range Status   Specimen Description BLOOD RIGHT FOREARM  Final   Special Requests   Final    BOTTLES DRAWN AEROBIC ONLY Blood Culture results may not be optimal due to an inadequate volume of blood received in culture bottles   Culture  Setup Time   Final    AEROBIC BOTTLE ONLY GRAM POSITIVE COCCI CRITICAL RESULT CALLED TO, READ BACK BY AND VERIFIED WITH: K AMEND [facility] 03/21/20 0059 JDW Performed at [facility], [place] [address], [place], [place] [address]    Culture ENTEROCOCCUS FAECALIS (A)  Final   Report Status 03/22/2020 FINAL  Final   Organism ID, Bacteria ENTEROCOCCUS FAECALIS  Final      Susceptibility   Enterococcus faecalis - MIC*    AMPICILLIN <=2 SENSITIVE Sensitive     VANCOMYCIN 1 SENSITIVE Sensitive     GENTAMICIN SYNERGY SENSITIVE Sensitive     * ENTEROCOCCUS FAECALIS  Blood Culture ID Panel (Reflexed)     Status: Abnormal   Collection Time: 03/20/20  7:45 AM  Result Value Ref Range Status   Enterococcus species DETECTED (A) NOT DETECTED Final    Comment: CRITICAL RESULT CALLED TO, READ BACK BY AND VERIFIED WITH: K AMEND PHARMD 03/21/20 0059 JDW    Vancomycin resistance NOT DETECTED NOT DETECTED Final   Listeria monocytogenes NOT DETECTED NOT DETECTED Final    Staphylococcus species NOT DETECTED NOT DETECTED Final   Staphylococcus aureus (BCID) NOT DETECTED NOT DETECTED Final   Streptococcus species NOT DETECTED NOT DETECTED Final   Streptococcus agalactiae NOT DETECTED NOT DETECTED Final   Streptococcus pneumoniae NOT DETECTED NOT DETECTED Final   Streptococcus pyogenes NOT DETECTED NOT DETECTED Final   Acinetobacter  baumannii NOT DETECTED NOT DETECTED Final   Enterobacteriaceae species NOT DETECTED NOT DETECTED Final   Enterobacter cloacae complex NOT DETECTED NOT DETECTED Final   Escherichia coli NOT DETECTED NOT DETECTED Final   Klebsiella oxytoca NOT DETECTED NOT DETECTED Final   Klebsiella pneumoniae NOT DETECTED NOT DETECTED Final   Proteus species NOT DETECTED NOT DETECTED Final   Serratia marcescens NOT DETECTED NOT DETECTED Final   Haemophilus influenzae NOT DETECTED NOT DETECTED Final   Neisseria meningitidis NOT DETECTED NOT DETECTED Final   Pseudomonas aeruginosa NOT DETECTED NOT DETECTED Final   Candida albicans NOT DETECTED NOT DETECTED Final   Candida glabrata NOT DETECTED NOT DETECTED Final   Candida krusei NOT DETECTED NOT DETECTED Final   Candida parapsilosis NOT DETECTED NOT DETECTED Final   Candida tropicalis NOT DETECTED NOT DETECTED Final    Comment: Performed at [facility], [place] [address] [place], [place] [address]  Culture, blood (routine x 2)     Status: Abnormal   Collection Time: 03/20/20  8:55 AM   Specimen: BLOOD  Result Value Ref Range Status   Specimen Description BLOOD LEFT ANTECUBITAL  Final   Special Requests   Final    BOTTLES DRAWN AEROBIC AND ANAEROBIC Blood Culture results may not be optimal due to an inadequate volume of blood received in culture bottles   Culture  Setup Time   Final    IN BOTH AEROBIC AND ANAEROBIC BOTTLES GRAM POSITIVE COCCI CRITICAL VALUE NOTED.  VALUE IS CONSISTENT WITH PREVIOUSLY REPORTED AND CALLED VALUE.    Culture (A)  Final    ENTEROCOCCUS  FAECALIS SUSCEPTIBILITIES PERFORMED ON PREVIOUS CULTURE WITHIN THE LAST 5 DAYS. Performed at [facility], [place] [address] [place], [place] [address]    Report Status 03/22/2020 FINAL  Final  Body fluid culture     Status: None (Preliminary result)   Collection Time: 03/21/20  4:42 PM   Specimen: Body Fluid  Result Value Ref Range Status   Specimen Description FLUID SYNOVIAL LEFT KNEE  Final   Special Requests SYRINGE  Final   Gram Stain   Final    FEW WBC PRESENT,BOTH PMN AND MONONUCLEAR NO ORGANISMS SEEN    Culture   Final    NO GROWTH < 12 HOURS Performed at [facility], [place] [address], [place], [place] [address]    Report Status PENDING  Incomplete    Studies/Results: DG Knee Left Port  Result Date: 03/21/2020 CLINICAL DATA:  LEFT knee pain and swelling, question septic knee EXAM: PORTABLE LEFT KNEE - 1-2 VIEW COMPARISON:  Portable exam 1146 hours without priors for comparison FINDINGS: Osseous demineralization. Scattered joint space narrowing and chondrocalcinosis. Lateral subluxation of tibia. Large joint effusion. Anterior soft tissue swelling. No acute fracture, dislocation or bone destruction. IMPRESSION: Osseous demineralization with diffuse degenerative changes and question CPPD LEFT knee. Large joint effusion and mild anterior soft tissue swelling without definite acute osseous findings. Septic joint cannot be excluded radiographically; if this is a clinical concern, recommend joint aspiration. Electronically Signed   By: Lavonia Dana M.D.   On: 03/21/2020 13:20   ECHOCARDIOGRAM COMPLETE  Result Date: 03/22/2020    ECHOCARDIOGRAM REPORT   Patient Name:   Johnathan Murray Date of Exam: 03/22/2020 Medical Rec #:  [id number]           Height:       70.0 in Accession #:    [id number]  Weight:       180.6 lb Date of Birth:  March 05, 1951           BSA:          1.999 m Patient Age:    69 years            BP:           117/57 mmHg Patient Gender: M                    HR:           86 bpm. Exam Location:  Inpatient Procedure: 2D Echo and Intracardiac Opacification Agent Indications:    Bacteremia 790.7 / R78.81  History:        Patient has prior history of Echocardiogram examinations, most                 recent 01/16/2020. CAD, 23 mm Edwards tissue valve,                 Arrythmias:Atrial Fibrillation; Risk Factors:Hypertension,                 Diabetes and Dyslipidemia.                 Aortic Valve: 23 mm Edwards-SAPIEN valve is present in the                 aortic position.  Sonographer:    Vikki Ports Turrentine Referring Phys: [place]  1. There is no obvious vegetation, however transaortic gradients across the bioprosthetic valve are increased from the prior study on 01/16/2020, mean gradient 28 mmHg, previously 16 mmHg. Consider TEE for further evaluation.  2. Left ventricular ejection fraction, by estimation, is 55 to 60%. The left ventricle has normal function. The left ventricle has no regional wall motion abnormalities. There is mild concentric left ventricular hypertrophy. Left ventricular diastolic parameters are consistent with Grade I diastolic dysfunction (impaired relaxation). Elevated left atrial pressure.  3. Right ventricular systolic function is normal. The right ventricular size is normal.  4. Left atrial size was mildly dilated.  5. The mitral valve is normal in structure. Mild mitral valve regurgitation. No evidence of mitral stenosis.  6. The aortic valve has been repaired/replaced. Aortic valve regurgitation is not visualized. No aortic stenosis is present. There is a 23 mm Edwards-SAPIEN valve present in the aortic position. Aortic valve mean gradient measures 28.0 mmHg.  7. The inferior vena cava is normal in size with greater than 50% respiratory variability, suggesting right atrial pressure of 3 mmHg. FINDINGS  Left Ventricle: Left ventricular ejection fraction, by estimation, is 55 to 60%. The left ventricle has normal  function. The left ventricle has no regional wall motion abnormalities. Definity contrast agent was given IV to delineate the left ventricular  endocardial borders. The left ventricular internal cavity size was normal in size. There is mild concentric left ventricular hypertrophy. Abnormal (paradoxical) septal motion consistent with post-operative status and abnormal (paradoxical) septal motion, consistent with left bundle branch block. Left ventricular diastolic parameters are consistent with Grade I diastolic dysfunction (impaired relaxation). Elevated left atrial pressure. Right Ventricle: The right ventricular size is normal. No increase in right ventricular wall thickness. Right ventricular systolic function is normal. Left Atrium: Left atrial size was mildly dilated. Right Atrium: Right atrial size was normal in size. Pericardium: There is no evidence of pericardial effusion. Mitral Valve: The mitral valve is normal in structure. Normal mobility  of the mitral valve leaflets. Mild mitral valve regurgitation. No evidence of mitral valve stenosis. Tricuspid Valve: The tricuspid valve is normal in structure. Tricuspid valve regurgitation is mild . No evidence of tricuspid stenosis. Aortic Valve: The aortic valve has been repaired/replaced. Aortic valve regurgitation is not visualized. No aortic stenosis is present. Aortic valve mean gradient measures 28.0 mmHg. Aortic valve peak gradient measures 45.1 mmHg. Aortic valve area, by VTI measures 1.31 cm. There is a 23 mm Edwards-SAPIEN valve present in the aortic position. Pulmonic Valve: The pulmonic valve was normal in structure. Pulmonic valve regurgitation is not visualized. No evidence of pulmonic stenosis. Aorta: The aortic root is normal in size and structure. Venous: The inferior vena cava is normal in size with greater than 50% respiratory variability, suggesting right atrial pressure of 3 mmHg. IAS/Shunts: No atrial level shunt detected by color flow  Doppler.  LEFT VENTRICLE PLAX 2D LVIDd:         4.40 cm  Diastology LVIDs:         3.10 cm  LV e' lateral:   14.00 cm/s LV PW:         1.10 cm  LV E/e' lateral: 8.1 LV IVS:        1.00 cm  LV e' medial:    4.88 cm/s LVOT diam:     1.80 cm  LV E/e' medial:  23.4 LV SV:         70 LV SV Index:   35 LVOT Area:     2.54 cm  LEFT ATRIUM           Index       RIGHT ATRIUM           Index LA diam:      4.00 cm 2.00 cm/m  RA Area:     19.80 cm LA Vol (A4C): 71.6 ml 35.83 ml/m RA Volume:   56.70 ml  28.37 ml/m  AORTIC VALVE AV Area (Vmax):    1.23 cm AV Area (Vmean):   1.17 cm AV Area (VTI):     1.31 cm AV Vmax:           335.60 cm/s AV Vmean:          242.600 cm/s AV VTI:            0.532 m AV Peak Grad:      45.1 mmHg AV Mean Grad:      28.0 mmHg LVOT Vmax:         162.00 cm/s LVOT Vmean:        112.000 cm/s LVOT VTI:          0.273 m LVOT/AV VTI ratio: 0.51  AORTA Ao Root diam: 2.50 cm MITRAL VALVE MV Area (PHT): 3.42 cm     SHUNTS MV Decel Time: 222 msec     Systemic VTI:  0.27 m MV E velocity: 114.00 cm/s  Systemic Diam: 1.80 cm MV A velocity: 95.50 cm/s MV E/A ratio:  1.19 Ena Dawley MD Electronically signed by Ena Dawley MD Signature Date/Time: 03/22/2020/1:01:34 PM    Final       Assessment/Plan:  INTERVAL HISTORY: pt sp aspiration of left knee which shows pseudogout TTE with increased gradient across PV    Principal Problem:   Enterococcal bacteremia Active Problems:   Essential hypertension   PAF (paroxysmal atrial fibrillation) (HCC)   Abnormal LFTs (liver function tests)   Cervical myelopathy (HCC)   S/P aortic valve replacement   ARF (  acute renal failure) (HCC)   Pseudogout of left knee    Johnathan Murray is a 69 y.o. male with  AMP S enterococcal bacteremia in setting of bioprosthetic AV. TTE without vegetations but increased gradient across prosthetic valve.  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

## 2020-03-23 ENCOUNTER — Inpatient Hospital Stay (HOSPITAL_COMMUNITY): Payer: BC Managed Care – PPO

## 2020-03-23 DIAGNOSIS — Z952 Presence of prosthetic heart valve: Secondary | ICD-10-CM | POA: Diagnosis not present

## 2020-03-23 DIAGNOSIS — M00862 Arthritis due to other bacteria, left knee: Secondary | ICD-10-CM | POA: Diagnosis not present

## 2020-03-23 DIAGNOSIS — I10 Essential (primary) hypertension: Secondary | ICD-10-CM | POA: Diagnosis not present

## 2020-03-23 DIAGNOSIS — M00262 Other streptococcal arthritis, left knee: Secondary | ICD-10-CM | POA: Diagnosis not present

## 2020-03-23 DIAGNOSIS — M542 Cervicalgia: Secondary | ICD-10-CM | POA: Diagnosis not present

## 2020-03-23 DIAGNOSIS — R7881 Bacteremia: Secondary | ICD-10-CM | POA: Diagnosis not present

## 2020-03-23 DIAGNOSIS — M11262 Other chondrocalcinosis, left knee: Secondary | ICD-10-CM | POA: Diagnosis not present

## 2020-03-23 DIAGNOSIS — Z981 Arthrodesis status: Secondary | ICD-10-CM

## 2020-03-23 DIAGNOSIS — R945 Abnormal results of liver function studies: Secondary | ICD-10-CM | POA: Diagnosis not present

## 2020-03-23 DIAGNOSIS — M11261 Other chondrocalcinosis, right knee: Secondary | ICD-10-CM | POA: Diagnosis not present

## 2020-03-23 DIAGNOSIS — I2510 Atherosclerotic heart disease of native coronary artery without angina pectoris: Secondary | ICD-10-CM | POA: Diagnosis not present

## 2020-03-23 DIAGNOSIS — M009 Pyogenic arthritis, unspecified: Secondary | ICD-10-CM | POA: Insufficient documentation

## 2020-03-23 DIAGNOSIS — A4181 Sepsis due to Enterococcus: Secondary | ICD-10-CM | POA: Diagnosis not present

## 2020-03-23 DIAGNOSIS — B952 Enterococcus as the cause of diseases classified elsewhere: Secondary | ICD-10-CM | POA: Diagnosis not present

## 2020-03-23 DIAGNOSIS — E1129 Type 2 diabetes mellitus with other diabetic kidney complication: Secondary | ICD-10-CM | POA: Diagnosis not present

## 2020-03-23 DIAGNOSIS — M23301 Other meniscus derangements, unspecified lateral meniscus, left knee: Secondary | ICD-10-CM | POA: Diagnosis not present

## 2020-03-23 DIAGNOSIS — M23303 Other meniscus derangements, unspecified medial meniscus, right knee: Secondary | ICD-10-CM | POA: Diagnosis not present

## 2020-03-23 DIAGNOSIS — M23300 Other meniscus derangements, unspecified lateral meniscus, right knee: Secondary | ICD-10-CM | POA: Diagnosis not present

## 2020-03-23 DIAGNOSIS — N179 Acute kidney failure, unspecified: Secondary | ICD-10-CM | POA: Diagnosis not present

## 2020-03-23 DIAGNOSIS — S83282A Other tear of lateral meniscus, current injury, left knee, initial encounter: Secondary | ICD-10-CM | POA: Diagnosis not present

## 2020-03-23 DIAGNOSIS — S83242A Other tear of medial meniscus, current injury, left knee, initial encounter: Secondary | ICD-10-CM | POA: Diagnosis not present

## 2020-03-23 DIAGNOSIS — S83281A Other tear of lateral meniscus, current injury, right knee, initial encounter: Secondary | ICD-10-CM | POA: Diagnosis not present

## 2020-03-23 DIAGNOSIS — G959 Disease of spinal cord, unspecified: Secondary | ICD-10-CM | POA: Diagnosis not present

## 2020-03-23 DIAGNOSIS — S83241A Other tear of medial meniscus, current injury, right knee, initial encounter: Secondary | ICD-10-CM | POA: Diagnosis not present

## 2020-03-23 DIAGNOSIS — I959 Hypotension, unspecified: Secondary | ICD-10-CM | POA: Diagnosis not present

## 2020-03-23 DIAGNOSIS — N170 Acute kidney failure with tubular necrosis: Secondary | ICD-10-CM | POA: Diagnosis not present

## 2020-03-23 HISTORY — DX: Arthrodesis status: Z98.1

## 2020-03-23 HISTORY — DX: Pyogenic arthritis, unspecified: M00.9

## 2020-03-23 LAB — CBC WITH DIFFERENTIAL/PLATELET
Abs Immature Granulocytes: 0 10*3/uL (ref 0.00–0.07)
Basophils Absolute: 0 10*3/uL (ref 0.0–0.1)
Basophils Relative: 0 %
Eosinophils Absolute: 0 10*3/uL (ref 0.0–0.5)
Eosinophils Relative: 0 %
HCT: 22 % — ABNORMAL LOW (ref 39.0–52.0)
Hemoglobin: 7.4 g/dL — ABNORMAL LOW (ref 13.0–17.0)
Lymphocytes Relative: 5 %
Lymphs Abs: 0.9 10*3/uL (ref 0.7–4.0)
MCH: 29.7 pg (ref 26.0–34.0)
MCHC: 33.6 g/dL (ref 30.0–36.0)
MCV: 88.4 fL (ref 80.0–100.0)
Monocytes Absolute: 0 10*3/uL — ABNORMAL LOW (ref 0.1–1.0)
Monocytes Relative: 0 %
Neutro Abs: 16.4 10*3/uL — ABNORMAL HIGH (ref 1.7–7.7)
Neutrophils Relative %: 95 %
Platelets: 303 10*3/uL (ref 150–400)
RBC: 2.49 MIL/uL — ABNORMAL LOW (ref 4.22–5.81)
RDW: 15 % (ref 11.5–15.5)
WBC: 17.3 10*3/uL — ABNORMAL HIGH (ref 4.0–10.5)
nRBC: 0 % (ref 0.0–0.2)
nRBC: 0 /100 WBC

## 2020-03-23 LAB — COMPREHENSIVE METABOLIC PANEL
ALT: 253 U/L — ABNORMAL HIGH (ref 0–44)
AST: 74 U/L — ABNORMAL HIGH (ref 15–41)
Albumin: 1.8 g/dL — ABNORMAL LOW (ref 3.5–5.0)
Alkaline Phosphatase: 117 U/L (ref 38–126)
Anion gap: 12 (ref 5–15)
BUN: 38 mg/dL — ABNORMAL HIGH (ref 8–23)
CO2: 20 mmol/L — ABNORMAL LOW (ref 22–32)
Calcium: 9 mg/dL (ref 8.9–10.3)
Chloride: 104 mmol/L (ref 98–111)
Creatinine, Ser: 1.93 mg/dL — ABNORMAL HIGH (ref 0.61–1.24)
GFR calc Af Amer: 41 mL/min — ABNORMAL LOW (ref >60)
GFR calc non Af Amer: 35 mL/min — ABNORMAL LOW (ref >60)
Glucose, Bld: 322 mg/dL — ABNORMAL HIGH (ref 70–99)
Potassium: 4.6 mmol/L (ref 3.5–5.1)
Sodium: 136 mmol/L (ref 135–145)
Total Bilirubin: 3.8 mg/dL — ABNORMAL HIGH (ref 0.3–1.2)
Total Protein: 6.2 g/dL — ABNORMAL LOW (ref 6.5–8.1)

## 2020-03-23 LAB — GLUCOSE, CAPILLARY
Glucose-Capillary: 299 mg/dL — ABNORMAL HIGH (ref 70–99)
Glucose-Capillary: 344 mg/dL — ABNORMAL HIGH (ref 70–99)
Glucose-Capillary: 331 mg/dL — ABNORMAL HIGH (ref 70–99)
Glucose-Capillary: 309 mg/dL — ABNORMAL HIGH (ref 70–99)

## 2020-03-23 LAB — SYNOVIAL CELL COUNT + DIFF, W/ CRYSTALS
Eosinophils-Synovial: 0 % (ref 0–1)
Lymphocytes-Synovial Fld: 1 % (ref 0–20)
Monocyte-Macrophage-Synovial Fluid: 1 % — ABNORMAL LOW (ref 50–90)
Neutrophil, Synovial: 98 % — ABNORMAL HIGH (ref 0–25)
WBC, Synovial: 19150 /mm3 — ABNORMAL HIGH (ref 0–200)

## 2020-03-23 LAB — APTT
aPTT: 113 seconds — ABNORMAL HIGH (ref 24–36)
aPTT: 79 seconds — ABNORMAL HIGH (ref 24–36)

## 2020-03-23 LAB — HEPARIN LEVEL (UNFRACTIONATED): Heparin Unfractionated: 1.86 IU/mL — ABNORMAL HIGH (ref 0.30–0.70)

## 2020-03-23 MED ORDER — INSULIN ASPART 100 UNIT/ML ~~LOC~~ SOLN
0.0000 [IU] | Freq: Every day | SUBCUTANEOUS | Status: DC
  Administered 2020-03-23: 4 [IU] via SUBCUTANEOUS
  Administered 2020-03-24: 3 [IU] via SUBCUTANEOUS
  Administered 2020-04-04 – 2020-04-06 (×2): 2 [IU] via SUBCUTANEOUS

## 2020-03-23 MED ORDER — POVIDONE-IODINE 10 % EX SWAB: 2.0000 "application " | Freq: Once | CUTANEOUS | Status: DC

## 2020-03-23 MED ORDER — ENSURE PRE-SURGERY PO LIQD
296.0000 mL | Freq: Once | ORAL | Status: AC
  Administered 2020-03-24: 296 mL via ORAL
  Filled 2020-03-23: qty 296

## 2020-03-23 MED ORDER — INSULIN ASPART 100 UNIT/ML ~~LOC~~ SOLN
0.0000 [IU] | Freq: Three times a day (TID) | SUBCUTANEOUS | Status: DC
  Administered 2020-03-23: 11 [IU] via SUBCUTANEOUS
  Administered 2020-03-24: 5 [IU] via SUBCUTANEOUS
  Administered 2020-03-24: 15 [IU] via SUBCUTANEOUS
  Administered 2020-03-25: 5 [IU] via SUBCUTANEOUS
  Administered 2020-03-25: 11 [IU] via SUBCUTANEOUS
  Administered 2020-03-25 – 2020-03-27 (×6): 3 [IU] via SUBCUTANEOUS
  Administered 2020-03-28: 5 [IU] via SUBCUTANEOUS
  Administered 2020-03-28: 3 [IU] via SUBCUTANEOUS
  Administered 2020-03-30: 5 [IU] via SUBCUTANEOUS
  Administered 2020-03-30: 2 [IU] via SUBCUTANEOUS
  Administered 2020-03-31: 3 [IU] via SUBCUTANEOUS
  Administered 2020-03-31: 2 [IU] via SUBCUTANEOUS
  Administered 2020-04-01: 8 [IU] via SUBCUTANEOUS
  Administered 2020-04-01: 2 [IU] via SUBCUTANEOUS
  Administered 2020-04-02: 3 [IU] via SUBCUTANEOUS
  Administered 2020-04-02: 2 [IU] via SUBCUTANEOUS
  Administered 2020-04-02: 5 [IU] via SUBCUTANEOUS
  Administered 2020-04-03: 3 [IU] via SUBCUTANEOUS
  Administered 2020-04-03: 2 [IU] via SUBCUTANEOUS
  Administered 2020-04-03: 8 [IU] via SUBCUTANEOUS
  Administered 2020-04-04 (×3): 3 [IU] via SUBCUTANEOUS
  Administered 2020-04-05: 2 [IU] via SUBCUTANEOUS
  Administered 2020-04-05 (×2): 5 [IU] via SUBCUTANEOUS
  Administered 2020-04-06: 3 [IU] via SUBCUTANEOUS
  Administered 2020-04-06 – 2020-04-07 (×2): 2 [IU] via SUBCUTANEOUS
  Administered 2020-04-07: 5 [IU] via SUBCUTANEOUS
  Administered 2020-04-07: 3 [IU] via SUBCUTANEOUS

## 2020-03-23 MED ORDER — ACETAMINOPHEN 500 MG PO TABS
1000.0000 mg | ORAL_TABLET | Freq: Once | ORAL | Status: AC
  Administered 2020-03-24: 1000 mg via ORAL
  Filled 2020-03-23: qty 2

## 2020-03-23 MED ORDER — CHLORHEXIDINE GLUCONATE 4 % EX LIQD
60.0000 mL | Freq: Once | CUTANEOUS | Status: AC
  Administered 2020-03-24: 4 via TOPICAL
  Filled 2020-03-23: qty 60

## 2020-03-23 MED ORDER — ENSURE ENLIVE PO LIQD
237.0000 mL | Freq: Three times a day (TID) | ORAL | Status: DC
  Administered 2020-03-25 – 2020-04-07 (×30): 237 mL via ORAL
  Filled 2020-03-23: qty 237

## 2020-03-23 NOTE — Progress Notes (Addendum)
Left knee aspirated 5/1 resulted in positive intracellular calcium pyrophosphate crystals, however culture came back this afternoon positive for enterococcus faecalis - Right knee aspirated 5/2 with no growth thus far - will keep him NPO at midnight with plan for irrigation and debridement of bilateral knees with Dr. Mardelle Matte tomorrow afternoon - will plan to discontinue heparin 6 hours prior to surgery - reviewed this plan with patient and wife and they are agreeable to [place] 03/23/2020, 6:19 PM

## 2020-03-23 NOTE — Progress Notes (Signed)
ANTICOAGULATION CONSULT NOTE Follow Up Consult  Pharmacy Consult for Heparin Indication: atrial fibrillation  Assessment: 33 YOM admitted for further workup of AKI and elevated LFTs. Pt has hx of a fib, for which he takes Eliquis at home; pharmacy was consulted to transition pt from Eliquis to IV heparin; last dose of Eliquis was taken on 4/29).  aPTT elevated this AM at 113 seconds  Pt had recent back surgery (cervical discectomy, fusion) ~3 weeks ago (reporting neck and back pain) and mild hematuria in ED, so using lower anticoagulation goal ranges at this time (see below).  Goal of Therapy:  Heparin level: 0.3-0.5 units/ml  aPTT 66-85 sec Monitor platelets by anticoagulation protocol: Yes   Plan:  Decrease heparin to 1400 units / hr 8 hour PTT Daily heparin level and aPTT  Thank you Anette Guarneri, PharmD [phone]

## 2020-03-23 NOTE — Progress Notes (Signed)
Patient ID: Johnathan Murray, male   DOB: Apr 06, 1951, 69 y.o.   MRN: [id number] S:Feeling a little better O:BP 121/74 (BP Location: Right Arm)   Pulse 89   Temp 98.7 F (37.1 C) (Oral)   Resp 18   Ht 5\' 10"  (1.778 m)   Wt 82 kg   SpO2 98%   BMI 25.94 kg/m   Intake/Output Summary (Last 24 hours) at 03/23/2020 1441 Last data filed at 03/23/2020 1306 Gross per 24 hour  Intake 2996.31 ml  Output 2650 ml  Net 346.31 ml   Intake/Output: I/O last 3 completed shifts: In: 3981.5 [P.O.:1320; I.V.:2161.5; IV [id number] Out: [id number] [Urine:3825]  Intake/Output this shift:  Total I/O In: 1234.6 [P.O.:360; I.V.:574.3; IV Piggyback:300.3] Out: 1275 [Urine:1275] Weight change:  Gen: Pale, elderly WM who appears fatigued but in NAD CVS: no rub Resp: cta Abd: +BS, soft, NT/ND Ext: trace pretibial edema bilaterally.   Recent Labs  Lab 03/19/20 1801 03/20/20 0331 03/20/20 1517 03/21/20 0234 03/22/20 0102 03/23/20 0641  NA 130* 133* 133* 131* 133* 136  K 4.1 4.2 4.1 3.9 4.1 4.6  CL 94* 98 99 99 101 104  CO2 19* 16* 18* 20* 22 20*  GLUCOSE 134* 71 72 80 88 322*  BUN 98* 106* 94* 78* 52* 38*  CREATININE 6.40* 6.53* 5.15* 4.26* 2.62* 1.93*  ALBUMIN 2.6* 2.1*  --  1.9* 1.8* 1.8*  CALCIUM 10.2 9.2 9.0 8.8* 8.8* 9.0  AST 415* 227*  --  648* 295* 74*  ALT 429* 302*  --  564* 458* 253*   Liver Function Tests: Recent Labs  Lab 03/21/20 0234 03/22/20 0102 03/23/20 0641  AST 648* 295* 74*  ALT 564* 458* 253*  ALKPHOS 153* 143* 117  BILITOT 5.3* 4.7* 3.8*  PROT 6.1* 6.3* 6.2*  ALBUMIN 1.9* 1.8* 1.8*   No results for input(s): LIPASE, AMYLASE in the last 168 hours. No results for input(s): AMMONIA in the last 168 hours. CBC: Recent Labs  Lab 03/19/20 1801 03/19/20 1801 03/20/20 0331 03/20/20 0331 03/21/20 0234 03/22/20 0102 03/23/20 0641  WBC 12.6*   < > 13.1*   < > 9.4 13.7* 17.3*  NEUTROABS 10.3*   < > 10.5*   < > 6.9 10.6* 16.4*  HGB 10.4*   < > 8.5*   < > 8.2* 8.1*  7.4*  HCT 31.2*   < > 24.8*   < > 23.5* 22.8* 22.0*  MCV 89.7  --  87.0  --  87.0 86.0 88.4  PLT 341   < > 304   < > 305 316 303   < > = values in this interval not displayed.   Cardiac Enzymes: Recent Labs  Lab 03/19/20 1801 03/20/20 0331  CKTOTAL 20* 54   CBG: Recent Labs  Lab 03/22/20 1138 03/22/20 1618 03/22/20 2048 03/23/20 0646 03/23/20 1228  GLUCAP 114* 157* 205* 299* 344*    Iron Studies:  Recent Labs    03/20/20 1517  IRON 43*  TIBC 150*  FERRITIN 3,811*   Studies/Results: MR CERVICAL SPINE WO CONTRAST  Result Date: 03/23/2020 CLINICAL DATA:  Enterococcal bacteremia and possible aortic valve endocarditis. History of prior cervical spine surgery, most recently 03/02/2020 when the patient underwent C3-4 ACDF. Severe neck pain. EXAM: MRI CERVICAL SPINE WITHOUT CONTRAST TECHNIQUE: Multiplanar, multisequence MR imaging of the cervical spine was performed. No intravenous contrast was administered. COMPARISON:  Cervical spine CT scan 03/19/2020. MRI cervical spine 10/28/2017. Postmyelogram CT scan 12/27/2019. FINDINGS: Alignment: Maintained. Vertebrae: Again seen is  postoperative change of C3-6 fusion. Hardware at C4-5 and C5-6 has been removed. Anterior plate and screws are in place at C3-4. No evidence of discitis or osteomyelitis. No fracture or worrisome lesion. Cord: Patient motion somewhat limits evaluation. There is a punctate focus of signal abnormality in the left hemicord at C4-5 compatible with myelomalacia. There may be mild signal abnormality within the cord at C3-4. No epidural abscess. Posterior Fossa, vertebral arteries, paraspinal tissues: Negative. Disc levels: C2-3: Shallow disc osteophyte complex and right worse than left facet arthropathy. There is some ligamentum flavum thickening. Mild central canal and mild to moderate right foraminal stenosis. Left foramen open. No change. C3-4: Status post discectomy and fusion since the prior postmyelogram CT. There is  severe facet degenerative disease on the left with marrow edema in the facets. Cord flattening seen on the prior examination appears resolved. The foramina appear open. Motion degrades evaluation. C4-5: Status post discectomy and fusion.  No stenosis. C5-6: Status post discectomy and fusion.  No stenosis. C6-7: Minimal bulge without stenosis. C7-T1: Shallow disc osteophyte complex and uncovertebral disease. No stenosis. T1-2: Disc bulge without stenosis. This level is imaged in the sagittal plane only. IMPRESSION: Negative for evidence of infection. Status post C3-4 discectomy and fusion. Central canal stenosis seen on the preoperative examination is improved. Marked facet degenerative change on the left with associated marrow edema noted. Although motion somewhat limits evaluation, there appears to be a small focus of myelomalacia in the left cord at C4-5. Electronically Signed   By: Inge Rise M.D.   On: 03/23/2020 11:59   ECHOCARDIOGRAM COMPLETE  Result Date: 03/22/2020    ECHOCARDIOGRAM REPORT   Patient Name:   Johnathan Murray Date of Exam: 03/22/2020 Medical Rec #:  [id number]           Height:       70.0 in Accession #:    [id number]          Weight:       180.6 lb Date of Birth:  05-24-1951           BSA:          1.999 m Patient Age:    69 years            BP:           117/57 mmHg Patient Gender: M                   HR:           86 bpm. Exam Location:  Inpatient Procedure: 2D Echo and Intracardiac Opacification Agent Indications:    Bacteremia 790.7 / R78.81  History:        Patient has prior history of Echocardiogram examinations, most                 recent 01/16/2020. CAD, 23 mm Edwards tissue valve,                 Arrythmias:Atrial Fibrillation; Risk Factors:Hypertension,                 Diabetes and Dyslipidemia.                 Aortic Valve: 23 mm Edwards-SAPIEN valve is present in the                 aortic position.  Sonographer:    Vikki Ports Turrentine Referring Phys: [place]  1. There is no  obvious vegetation, however transaortic gradients across the bioprosthetic valve are increased from the prior study on 01/16/2020, mean gradient 28 mmHg, previously 16 mmHg. Consider TEE for further evaluation.  2. Left ventricular ejection fraction, by estimation, is 55 to 60%. The left ventricle has normal function. The left ventricle has no regional wall motion abnormalities. There is mild concentric left ventricular hypertrophy. Left ventricular diastolic parameters are consistent with Grade I diastolic dysfunction (impaired relaxation). Elevated left atrial pressure.  3. Right ventricular systolic function is normal. The right ventricular size is normal.  4. Left atrial size was mildly dilated.  5. The mitral valve is normal in structure. Mild mitral valve regurgitation. No evidence of mitral stenosis.  6. The aortic valve has been repaired/replaced. Aortic valve regurgitation is not visualized. No aortic stenosis is present. There is a 23 mm Edwards-SAPIEN valve present in the aortic position. Aortic valve mean gradient measures 28.0 mmHg.  7. The inferior vena cava is normal in size with greater than 50% respiratory variability, suggesting right atrial pressure of 3 mmHg. FINDINGS  Left Ventricle: Left ventricular ejection fraction, by estimation, is 55 to 60%. The left ventricle has normal function. The left ventricle has no regional wall motion abnormalities. Definity contrast agent was given IV to delineate the left ventricular  endocardial borders. The left ventricular internal cavity size was normal in size. There is mild concentric left ventricular hypertrophy. Abnormal (paradoxical) septal motion consistent with post-operative status and abnormal (paradoxical) septal motion, consistent with left bundle branch block. Left ventricular diastolic parameters are consistent with Grade I diastolic dysfunction (impaired relaxation). Elevated left atrial pressure. Right Ventricle:  The right ventricular size is normal. No increase in right ventricular wall thickness. Right ventricular systolic function is normal. Left Atrium: Left atrial size was mildly dilated. Right Atrium: Right atrial size was normal in size. Pericardium: There is no evidence of pericardial effusion. Mitral Valve: The mitral valve is normal in structure. Normal mobility of the mitral valve leaflets. Mild mitral valve regurgitation. No evidence of mitral valve stenosis. Tricuspid Valve: The tricuspid valve is normal in structure. Tricuspid valve regurgitation is mild . No evidence of tricuspid stenosis. Aortic Valve: The aortic valve has been repaired/replaced. Aortic valve regurgitation is not visualized. No aortic stenosis is present. Aortic valve mean gradient measures 28.0 mmHg. Aortic valve peak gradient measures 45.1 mmHg. Aortic valve area, by VTI measures 1.31 cm. There is a 23 mm Edwards-SAPIEN valve present in the aortic position. Pulmonic Valve: The pulmonic valve was normal in structure. Pulmonic valve regurgitation is not visualized. No evidence of pulmonic stenosis. Aorta: The aortic root is normal in size and structure. Venous: The inferior vena cava is normal in size with greater than 50% respiratory variability, suggesting right atrial pressure of 3 mmHg. IAS/Shunts: No atrial level shunt detected by color flow Doppler.  LEFT VENTRICLE PLAX 2D LVIDd:         4.40 cm  Diastology LVIDs:         3.10 cm  LV e' lateral:   14.00 cm/s LV PW:         1.10 cm  LV E/e' lateral: 8.1 LV IVS:        1.00 cm  LV e' medial:    4.88 cm/s LVOT diam:     1.80 cm  LV E/e' medial:  23.4 LV SV:         70 LV SV Index:   35 LVOT Area:     2.54 cm  LEFT  ATRIUM           Index       RIGHT ATRIUM           Index LA diam:      4.00 cm 2.00 cm/m  RA Area:     19.80 cm LA Vol (A4C): 71.6 ml 35.83 ml/m RA Volume:   56.70 ml  28.37 ml/m  AORTIC VALVE AV Area (Vmax):    1.23 cm AV Area (Vmean):   1.17 cm AV Area (VTI):     1.31  cm AV Vmax:           335.60 cm/s AV Vmean:          242.600 cm/s AV VTI:            0.532 m AV Peak Grad:      45.1 mmHg AV Mean Grad:      28.0 mmHg LVOT Vmax:         162.00 cm/s LVOT Vmean:        112.000 cm/s LVOT VTI:          0.273 m LVOT/AV VTI ratio: 0.51  AORTA Ao Root diam: 2.50 cm MITRAL VALVE MV Area (PHT): 3.42 cm     SHUNTS MV Decel Time: 222 msec     Systemic VTI:  0.27 m MV E velocity: 114.00 cm/s  Systemic Diam: 1.80 cm MV A velocity: 95.50 cm/s MV E/A ratio:  1.19 Ena Dawley MD Electronically signed by Ena Dawley MD Signature Date/Time: 03/22/2020/1:01:34 PM    Final    . bupivacaine  10 mL Infiltration Once  . feeding supplement (ENSURE ENLIVE)  237 mL Oral TID BM  . insulin aspart  0-15 Units Subcutaneous TID WC  . insulin aspart  0-5 Units Subcutaneous QHS  . iron polysaccharides  150 mg Oral Q breakfast  . levothyroxine  25 mcg Oral Q0600  . methylPREDNISolone acetate  40 mg Intra-articular Once  . methylPREDNISolone acetate  40 mg Intra-articular Once  . midodrine  10 mg Oral TID WC  . sodium chloride flush  3 mL Intravenous Once    BMET    Component Value Date/Time   NA 136 03/23/2020 0641   NA 139 02/04/2020 1451   K 4.6 03/23/2020 0641   CL 104 03/23/2020 0641   CO2 20 (L) 03/23/2020 0641   GLUCOSE 322 (H) 03/23/2020 0641   BUN 38 (H) 03/23/2020 0641   BUN 22 02/04/2020 1451   CREATININE 1.93 (H) 03/23/2020 0641   CALCIUM 9.0 03/23/2020 0641   GFRNONAA 35 (L) 03/23/2020 0641   GFRAA 41 (L) 03/23/2020 0641   CBC    Component Value Date/Time   WBC 17.3 (H) 03/23/2020 0641   RBC 2.49 (L) 03/23/2020 0641   HGB 7.4 (L) 03/23/2020 0641   HGB 13.2 01/17/2020 1528   HCT 22.0 (L) 03/23/2020 0641   HCT 37.6 01/17/2020 1528   PLT 303 03/23/2020 0641   PLT 221 01/17/2020 1528   MCV 88.4 03/23/2020 0641   MCV 94 01/17/2020 1528   MCH 29.7 03/23/2020 0641   MCHC 33.6 03/23/2020 0641   RDW 15.0 03/23/2020 0641   RDW 12.3 01/17/2020 1528   LYMPHSABS  0.9 03/23/2020 0641   LYMPHSABS 1.8 01/17/2020 1528   MONOABS 0.0 (L) 03/23/2020 0641   EOSABS 0.0 03/23/2020 0641   EOSABS 0.1 01/17/2020 1528   BASOSABS 0.0 03/23/2020 0641   BASOSABS 0.0 01/17/2020 1528     Assessment/Plan:  1. AKI/CKD  IIIa- in setting of hypotension, volume depletion, NSAID use with concomitant RAAS blockade.  Renal US without obstruction.  BUN/Cr and UOP has significantly improved with IVF's and discontinuing offending agents. 1. Nothing further to add.  Will sign off.  Please call with questions or concerns. 2. No need for renal follow up if he returns to his baseline. 3. Hold off on restarting benezepril until he follows up with his PCP after discharge. 2. Enterococcus bacteremia- on ampicillin per primary svc 3. Hypovolemic hypotension- improved with IVF"s.  Continue with supportive care 4. Cervical decompression- supportive care.  AVOID NSAIDS and COX-II inhibitors 5. CKD stage IIIa- baseline Scr 1.3-1.6 6. Anemia- likely combo of recent surgery and acute illness.  Transfuse prn.  Donetta Potts, MD [employer] [phone]

## 2020-03-23 NOTE — Progress Notes (Signed)
Initial Nutrition Assessment  DOCUMENTATION CODES:   Severe malnutrition in context of acute illness/injury  INTERVENTION:    Ensure Enlive po TID, each supplement provides 350 kcal and 20 grams of protein.  Recommend liberalize diet to regular to help improve PO intake of meals.  NUTRITION DIAGNOSIS:   Severe Malnutrition related to acute illness as evidenced by energy intake < or equal to 50% for > or equal to 5 days, percent weight loss(6% weight loss within 1 month).  GOAL:   Patient will meet greater than or equal to 90% of their needs  MONITOR:   PO intake, Supplement acceptance, Labs  REASON FOR ASSESSMENT:   Consult Assessment of nutrition requirement/status, Poor PO  ASSESSMENT:   69 yo male admitted with ruptured LLE Baker's cyst, enterococcal bacteremia, possible AV endocarditis, AKI. S/P aspiration of L knee. PMH includes CKD-IIIa, PAF, HTN, biprosthetic aortic valve replacement, recent cervical discectomy and fusion.   Patient in a procedure out of room at time of RD visit. Spoke with patient's daughter who reports patient has not been eating or drinking well for the past 3 weeks since his surgery. Yesterday's meal was his first "real" meal in 3 weeks. She thinks her mother has not been taking care of him very well.   6% weight loss within the past month is significant for the time frame.   Labs reviewed. BUN 38 (H), creat 1.93 (H), corrected calcium 10.76 (H) CBG's: 205-299  Medications reviewed and include Novolog, Niferex, Depo-medrol, IV ampicillin, IV Rocephin.  Patient meets criteria for severe malnutrition r/t acute illness with intake meeting </= 50% of estimated requirement for >/= 5 days and 6% weight loss within the past month.    NUTRITION - FOCUSED PHYSICAL EXAM:  unable to complete, patient out of room  Diet Order:   Diet Order            Diet heart healthy/carb modified Room service appropriate? Yes; Fluid consistency: Thin  Diet  effective now              EDUCATION NEEDS:   Not appropriate for education at this time  Skin:  Skin Assessment: Reviewed RN Assessment  Last BM:  4/28  Height:   Ht Readings from Last 1 Encounters:  03/20/20 5\' 10"  (1.778 m)    Weight:   Wt Readings from Last 1 Encounters:  03/22/20 82 kg    Ideal Body Weight:  75.5 kg  BMI:  Body mass index is 25.94 kg/m.  Estimated Nutritional Needs:   Kcal:  1950-2150  Protein:  105-125 gm  Fluid:  >/= 2 L    Molli Barrows, RD, LDN, CNSC Please refer to Amion for contact information.

## 2020-03-23 NOTE — Progress Notes (Signed)
ANTICOAGULATION CONSULT NOTE - Follow Up Consult  Pharmacy Consult for Heparin Indication: atrial fibrillation (while apixaban on hold)  Allergies  Allergen Reactions  . Atorvastatin Other (See Comments)    Urinary retention   . Nebivolol Diarrhea    Patient Measurements: Height: 5\' 10"  (177.8 cm) Weight: 82 kg (180 lb 12.4 oz) IBW/kg (Calculated) : 73 Heparin Dosing Weight: 86.2  Vital Signs: Temp: 98 F (36.7 C) (05/03 1635) Temp Source: Oral (05/03 1635) BP: 124/70 (05/03 1635) Pulse Rate: 80 (05/03 1635)  Labs: Recent Labs    03/21/20 0234 03/21/20 1311 03/22/20 0102 03/23/20 0641 03/23/20 1712  HGB 8.2*  --  8.1* 7.4*  --   HCT 23.5*  --  22.8* 22.0*  --   PLT 305  --  316 303  --   APTT 53*   < > 76* 113* 79*  HEPARINUNFRC >2.20*  --  2.16* 1.86*  --   CREATININE 4.26*  --  2.62* 1.93*  --    < > = values in this interval not displayed.    Estimated Creatinine Clearance: 38.3 mL/min (A) (by C-G formula based on SCr of 1.93 mg/dL (H)).   Medications:  Scheduled:  . bupivacaine  10 mL Infiltration Once  . feeding supplement (ENSURE ENLIVE)  237 mL Oral TID BM  . insulin aspart  0-15 Units Subcutaneous TID WC  . insulin aspart  0-5 Units Subcutaneous QHS  . iron polysaccharides  150 mg Oral Q breakfast  . levothyroxine  25 mcg Oral Q0600  . methylPREDNISolone acetate  40 mg Intra-articular Once  . methylPREDNISolone acetate  40 mg Intra-articular Once  . midodrine  10 mg Oral TID WC  . sodium chloride flush  3 mL Intravenous Once   Infusions:  . ampicillin (OMNIPEN) IV 2 g (03/23/20 1612)  . cefTRIAXone (ROCEPHIN)  IV 2 g (03/23/20 1455)  . heparin 1,400 Units/hr (03/23/20 1814)    Assessment: 69 yo M on apixaban PTA for afib.  Apixiban held on admission due to AKI.  Pt continues on heparin infusion managed by aPTTs because apixaban use can falsely elevate heparin levels.   Targeting lower aPTT goal given recent surgery.  Pt currently therapeutic  on 1400 units/hr.  Goal of Therapy:  Heparin level 0.3-0.5 units/ml aPTT 66-85 seconds Monitor platelets by anticoagulation protocol: Yes   Plan:  Continue heparin at 1400 units/hr. Next aPTT/heparin level with AM labs.  [employer], Pharm.D., BCPS Clinical Pharmacist Clinical phone for 03/23/2020 is [phone].  **Pharmacist phone directory can be found on [email or web address] listed under [place].  03/23/2020 6:31 PM

## 2020-03-23 NOTE — Progress Notes (Signed)
[facility]  Date of Admission:  03/19/2020     Total days of antibiotics 4         ASSESSMENT:  Johnathan Murray has enterococcal bacteremia with most likely source being septic arthritis of the left knee as aspirate is growing enterococcus faecalis. Recheck blood culture for clearance of bacteremia. TTE with transaortic gradients across the bioprosthetic valve. Ideally will be able to complete TEE but does have previous history of cervical surgery. 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 Discussed the plan of care and likely need of PICC line for up to 6 weeks of IV therapy.   PLAN:  1. Continue ampicillin and ceftriaxone. 2. Repeat blood cultures for clearance of bacteremia. 3. Hold central line/PICC placement pending culture clearance.  4. TEE recommended if able to complete with hx of cervical surgery  Principal Problem:   Enterococcal bacteremia Active Problems:   Essential hypertension   PAF (paroxysmal atrial fibrillation) (HCC)   Abnormal LFTs (liver function tests)   Cervical myelopathy (HCC)   S/P aortic valve replacement   ARF (acute renal failure) (HCC)   Pseudogout of left knee   . bupivacaine  10 mL Infiltration Once  . feeding supplement (ENSURE ENLIVE)  237 mL Oral TID BM  . insulin aspart  0-15 Units Subcutaneous TID WC  . insulin aspart  0-5 Units Subcutaneous QHS  . iron polysaccharides  150 mg Oral Q breakfast  . levothyroxine  25 mcg Oral Q0600  . methylPREDNISolone acetate  40 mg Intra-articular Once  . methylPREDNISolone acetate  40 mg Intra-articular Once  . midodrine  10 mg Oral TID WC  . sodium chloride flush  3 mL Intravenous Once    SUBJECTIVE:  Afebrile overnight with no acute events. WBC count increased to 17.3. Knee aspiration culture with Enterococcus faecalis. MRI of the cervical spine without evidence of infection. Feeling better. Would like to move  around more and curious about when he can go home.   Allergies  Allergen Reactions  . Atorvastatin Other (See Comments)    Urinary retention   . Nebivolol Diarrhea     Review of Systems: Review of Systems  Constitutional: Negative for chills, fever and weight loss.  Respiratory: Negative for cough, shortness of breath and wheezing.   Cardiovascular: Negative for chest pain and leg swelling.  Gastrointestinal: Negative for abdominal pain, constipation, diarrhea, nausea and vomiting.  Skin: Negative for rash.      OBJECTIVE: Vitals:   03/22/20 2047 03/23/20 0445 03/23/20 0850 03/23/20 1230  BP: 134/79 (!) 130/59 122/77 121/74  Pulse: 91 90 87 89  Resp: 17 18 18 18   Temp: 98.4 F (36.9 C) 98.3 F (36.8 C) 97.6 F (36.4 C) 98.7 F (37.1 C)  TempSrc: Oral Oral Oral Oral  SpO2: 100% 99% 99% 98%  Weight: 82 kg     Height:       Body mass index is 25.94 kg/m.  Physical Exam Constitutional:      General: He is not in acute distress.    Appearance: He is well-developed.  Cardiovascular:     Rate and Rhythm: Normal rate and regular rhythm.     Heart sounds: Normal heart sounds.  Pulmonary:     Effort: Pulmonary effort is normal.     Breath sounds: Normal breath sounds.  Skin:    General: Skin is warm and dry.  Neurological:     Mental Status: He is alert  and oriented to person, place, and time.  Psychiatric:        Behavior: Behavior normal.        Thought Content: Thought content normal.        Judgment: Judgment normal.     Lab Results Lab Results  Component Value Date   WBC 17.3 (H) 03/23/2020   HGB 7.4 (L) 03/23/2020   HCT 22.0 (L) 03/23/2020   MCV 88.4 03/23/2020   PLT 303 03/23/2020    Lab Results  Component Value Date   CREATININE 1.93 (H) 03/23/2020   BUN 38 (H) 03/23/2020   NA 136 03/23/2020   K 4.6 03/23/2020   CL 104 03/23/2020   CO2 20 (L) 03/23/2020    Lab Results  Component Value Date   ALT 253 (H) 03/23/2020   AST 74 (H) 03/23/2020    ALKPHOS 117 03/23/2020   BILITOT 3.8 (H) 03/23/2020     Microbiology: Recent Results (from the past 240 hour(s))  Respiratory Panel by RT PCR (Flu A&B, Covid) - Nasopharyngeal Swab     Status: None   Collection Time: 03/19/20 10:22 PM   Specimen: Nasopharyngeal Swab  Result Value Ref Range Status   SARS Coronavirus 2 by RT PCR NEGATIVE NEGATIVE Final    Comment: (NOTE) SARS-CoV-2 target nucleic acids are NOT DETECTED. The SARS-CoV-2 RNA is generally detectable in upper respiratoy specimens during the acute phase of infection. The lowest concentration of SARS-CoV-2 viral copies this assay can detect is 131 copies/mL. A negative result does not preclude SARS-Cov-2 infection and should not be used as the sole basis for treatment or other patient management decisions. A negative result may occur with  improper specimen collection/handling, submission of specimen other than nasopharyngeal swab, presence of viral mutation(s) within the areas targeted by this assay, and inadequate number of viral copies (<131 copies/mL). A negative result must be combined with clinical observations, patient history, and epidemiological information. The expected result is Negative. Fact Sheet for Patients:  PinkCheek.be Fact Sheet for Healthcare Providers:  GravelBags.it This test is not yet ap proved or cleared by the [place] FDA and  has been authorized for detection and/or diagnosis of SARS-CoV-2 by FDA under an Emergency Use Authorization (EUA). This EUA will remain  in effect (meaning this test can be used) for the duration of the COVID-19 declaration under Section 564(b)(1) of the Act, 21 U.S.C. section 360bbb-3(b)(1), unless the authorization is terminated or revoked sooner.    Influenza A by PCR NEGATIVE NEGATIVE Final   Influenza B by PCR NEGATIVE NEGATIVE Final    Comment: (NOTE) The Xpert Xpress SARS-CoV-2/FLU/RSV assay is  intended as an aid in  the diagnosis of influenza from Nasopharyngeal swab specimens and  should not be used as a sole basis for treatment. Nasal washings and  aspirates are unacceptable for Xpert Xpress SARS-CoV-2/FLU/RSV  testing. Fact Sheet for Patients: PinkCheek.be Fact Sheet for Healthcare Providers: GravelBags.it This test is not yet approved or cleared by the [place] FDA and  has been authorized for detection and/or diagnosis of SARS-CoV-2 by  FDA under an Emergency Use Authorization (EUA). This EUA will remain  in effect (meaning this test can be used) for the duration of the  Covid-19 declaration under Section 564(b)(1) of the Act, 21  U.S.C. section 360bbb-3(b)(1), unless the authorization is  terminated or revoked. Performed at [facility], [place] [address] [place], [place] [address]   Urine culture     Status: None   Collection  Time: 03/19/20 10:39 PM   Specimen: Urine, Random  Result Value Ref Range Status   Specimen Description URINE, RANDOM  Final   Special Requests NONE  Final   Culture   Final    NO GROWTH Performed at [facility], [address], [place], [place] [address]    Report Status 03/20/2020 FINAL  Final  Culture, blood (routine x 2)     Status: Abnormal   Collection Time: 03/20/20  7:45 AM   Specimen: BLOOD RIGHT FOREARM  Result Value Ref Range Status   Specimen Description BLOOD RIGHT FOREARM  Final   Special Requests   Final    BOTTLES DRAWN AEROBIC ONLY Blood Culture results may not be optimal due to an inadequate volume of blood received in culture bottles   Culture  Setup Time   Final    AEROBIC BOTTLE ONLY GRAM POSITIVE COCCI CRITICAL RESULT CALLED TO, READ BACK BY AND VERIFIED WITH: K AMEND [facility] 03/21/20 0059 JDW Performed at [facility], [place] [address], [place], [place] [address]    Culture ENTEROCOCCUS FAECALIS (A)  Final   Report Status 03/22/2020 FINAL   Final   Organism ID, Bacteria ENTEROCOCCUS FAECALIS  Final      Susceptibility   Enterococcus faecalis - MIC*    AMPICILLIN <=2 SENSITIVE Sensitive     VANCOMYCIN 1 SENSITIVE Sensitive     GENTAMICIN SYNERGY SENSITIVE Sensitive     * ENTEROCOCCUS FAECALIS  Blood Culture ID Panel (Reflexed)     Status: Abnormal   Collection Time: 03/20/20  7:45 AM  Result Value Ref Range Status   Enterococcus species DETECTED (A) NOT DETECTED Final    Comment: CRITICAL RESULT CALLED TO, READ BACK BY AND VERIFIED WITH: K AMEND PHARMD 03/21/20 0059 JDW    Vancomycin resistance NOT DETECTED NOT DETECTED Final   Listeria monocytogenes NOT DETECTED NOT DETECTED Final   Staphylococcus species NOT DETECTED NOT DETECTED Final   Staphylococcus aureus (BCID) NOT DETECTED NOT DETECTED Final   Streptococcus species NOT DETECTED NOT DETECTED Final   Streptococcus agalactiae NOT DETECTED NOT DETECTED Final   Streptococcus pneumoniae NOT DETECTED NOT DETECTED Final   Streptococcus pyogenes NOT DETECTED NOT DETECTED Final   Acinetobacter baumannii NOT DETECTED NOT DETECTED Final   Enterobacteriaceae species NOT DETECTED NOT DETECTED Final   Enterobacter cloacae complex NOT DETECTED NOT DETECTED Final   Escherichia coli NOT DETECTED NOT DETECTED Final   Klebsiella oxytoca NOT DETECTED NOT DETECTED Final   Klebsiella pneumoniae NOT DETECTED NOT DETECTED Final   Proteus species NOT DETECTED NOT DETECTED Final   Serratia marcescens NOT DETECTED NOT DETECTED Final   Haemophilus influenzae NOT DETECTED NOT DETECTED Final   Neisseria meningitidis NOT DETECTED NOT DETECTED Final   Pseudomonas aeruginosa NOT DETECTED NOT DETECTED Final   Candida albicans NOT DETECTED NOT DETECTED Final   Candida glabrata NOT DETECTED NOT DETECTED Final   Candida krusei NOT DETECTED NOT DETECTED Final   Candida parapsilosis NOT DETECTED NOT DETECTED Final   Candida tropicalis NOT DETECTED NOT DETECTED Final    Comment: Performed at [facility], [place] [address], [place],  [address]  Culture, blood (routine x 2)     Status: Abnormal   Collection Time: 03/20/20  8:55 AM   Specimen: BLOOD  Result Value Ref Range Status   Specimen Description BLOOD LEFT ANTECUBITAL  Final   Special Requests   Final    BOTTLES DRAWN AEROBIC AND ANAEROBIC Blood Culture  results may not be optimal due to an inadequate volume of blood received in culture bottles   Culture  Setup Time   Final    IN BOTH AEROBIC AND ANAEROBIC BOTTLES GRAM POSITIVE COCCI CRITICAL VALUE NOTED.  VALUE IS CONSISTENT WITH PREVIOUSLY REPORTED AND CALLED VALUE.    Culture (A)  Final    ENTEROCOCCUS FAECALIS SUSCEPTIBILITIES PERFORMED ON PREVIOUS CULTURE WITHIN THE LAST 5 DAYS. Performed at [facility], [place] [address] [place], [place] [address]    Report Status 03/22/2020 FINAL  Final  Body fluid culture     Status: None (Preliminary result)   Collection Time: 03/21/20  4:42 PM   Specimen: Body Fluid  Result Value Ref Range Status   Specimen Description FLUID SYNOVIAL LEFT KNEE  Final   Special Requests SYRINGE  Final   Gram Stain   Final    FEW WBC PRESENT,BOTH PMN AND MONONUCLEAR NO ORGANISMS SEEN Performed at [facility], [address] [place], [place] [address]    Culture RARE ENTEROCOCCUS FAECALIS  Final   Report Status PENDING  Incomplete  Gram stain     Status: None   Collection Time: 03/22/20  8:36 PM   Specimen: Synovium; Body Fluid  Result Value Ref Range Status   Specimen Description SYNOVIAL RIGHT KNEE  Final   Special Requests NONE  Final   Gram Stain   Final    ABUNDANT WBC PRESENT, PREDOMINANTLY PMN NO ORGANISMS SEEN Performed at [facility], [address], [place], [place] [address]    Report Status 03/22/2020 FINAL  Final  Culture, body fluid-bottle     Status: None (Preliminary result)   Collection Time: 03/22/20  8:42 PM   Specimen: Synovium  Result Value Ref Range Status   Specimen Description  SYNOVIAL RIGHT KNEE  Final   Special Requests   Final    BOTTLES DRAWN AEROBIC ONLY Blood Culture adequate volume   Culture   Final    NO GROWTH < 12 HOURS Performed at [facility], [place] [address], [place], [place] [address]    Report Status PENDING  Incomplete     Terri Piedra, [place] [facility]  03/23/2020  2:54 PM

## 2020-03-23 NOTE — Progress Notes (Signed)
PROGRESS NOTE  Johnathan Murray [id number] DOB: August 16, 1951 DOA: 03/19/2020 PCP: Nicoletta Dress, MD  HPI/Recap of past 24 hours: HPI from Dr Julio Sicks is a 69 y.o. male with history of paroxysmal atrial fibrillation, hypertension, bioprosthetic aortic valve replacement who had undergone recent cervical discectomy and fusion about 3 weeks ago done by Dr. Trenton Gammon has been feeling weak with increasing pain in the neck and low back and poor appetite.  Denies any vomiting or diarrhea denies taking any Tylenol but does take NSAIDs sometimes.  Has not noticed any blood in the urine and has been urinating less last few days.  Has subjective feeling of fever chills.  Given the symptoms patient presents to the ER.  Patient states over the last 3 weeks and surgery patient has been finding it difficult to ambulate because of the weakness of the lower extremity.  Denies any incontinence of urine or bowel.  Has been having more lower extremity edema more on the left side. In the ER on exam patient appears generally weak with weakness of the lower extremity.  There is edema of the lower extremity on the left side.  Labs show creatinine of 6.4 which is increased from 1.6 about 3 weeks ago.  In addition patient's LFTs are markedly elevated at AST of 415 and ALT of 429 patient's albumin is 2.6 total bilirubin 5.5 bicarb 19 sodium 130 calcium 10.2 hemoglobin 10.4 lactic acid 1.6 WBC 12.6 platelets 341 urinalysis shows RBCs of 21-50 WBC 21-50 protein around 100 bacteria rare hyaline cast present mucus present. Patient was given fluid bolus and admitted for acute renal failure.  Patient also had CT abdomen pelvis and CT cervical spine which did not show any acute. USS of the abdomen also was unremarkable.  Covid test is negative.      Today, patient still complaining of bilateral knee pain, although improved from presentation.  Still with generalized fatigue, poor appetite.  Denies any chest  pain, abdominal pain, nausea/vomiting, fever/chills.       Assessment/Plan: Principal Problem:   Enterococcal bacteremia Active Problems:   Essential hypertension   PAF (paroxysmal atrial fibrillation) (HCC)   Abnormal LFTs (liver function tests)   Cervical myelopathy (HCC)   S/P aortic valve replacement   ARF (acute renal failure) (HCC)   Pseudogout of left knee   AKI on CKD stage III/anion gap metabolic acidosis Improved Multifactorial from bacteremia, poor oral intake, in addition to medication induced from NSAID, Lasix and lisinopril use Baseline creatinine 1.3-1.6, on presentation 6.40 Noted urine with RBCs and protein-further work-up Per nephrology Discontinued IV fluids Hold patient's Lasix, lisinopril CT abdomen does not show any obstruction Nephrology consulted, signed off Daily BMP  Sepsis likely 2/2 Enterococcus faecalis bacteremia On presentation, patient was hypotensive, with leukocytosis Source from possible knee infection vs recent postoperative cervical/lumbar infection Afebrile, with leukocytosis (steroid in knee) BC x2 growing Enterococcus faecalis, repeat BC x2 pending UC no growth, Chest x-ray unremarkable Lactic acid, TSH WNL, cortisol WNL Procalcitonin trending down TTE showed no obvious vegetation but increased transaortic gradients across the bioprosthetic valve (unsure if patient will be able to get TEE with recent C-spine surgery) Cardiology consulted for possible TEE ID on board, continue IV ampicillin, ceftriaxone PCCM consulted, signed off Monitor closely  Likely LLE septic knee with ruptured Baker's cyst with possibly pseudogout Doppler showed ruptured Baker's cyst on LLE Left knee x-ray showed large joint effusion, septic joint cannot be excluded Orthopedics consulted s/p LLE joint aspiration on  03/21/2020 and RLE joint aspiration on 03/22/2020 both showed positive intracellular calcium pyrophosphate crystals, WBC over 15,000, culture grew  rare E faecalis from LLE, culture from RLE pending Status post steroid injection in both knees on 03/22/2020 by orthopedics Orthopedics following patient, may need ??I&D of both knees  Hyperglycemia Likely 2/2 recent steroid injection in the knee SSI, Accu-Cheks, hypoglycemic protocol  Transaminitis ?? Likely 2/2 above sepsis/hypotension/shock liver INR WNL Acute hepatitis panel nonreactive Hold home amiodarone Daily CMP  Paroxysmal A. Fib/bioprosthetic aortic valve replacement Heart rate WNL Hold home amiodarone TTE as above Switch to IV Heparin, continue to hold home Eliquis for now due to transaminitis and the need for any procedure Telemetry  Neck and back pain History of recent cervical decompression surgery Imaging does not show anything acute as mentioned above MRI cervical spine with no evidence of infection Pain management, if pain worsens/persists we will consult Dr. Trenton Gammon patient's neurosurgeon PT/OT  Normocytic anemia/postop anemia Hemoglobin dropped to 8.5, no obvious signs of bleeding Anemia panel iron 43, sats 29, ferritin 3,811 Continue oral iron supplementation Daily CBC  Hypothyroidism TSH levels WNL Continue Synthroid       Malnutrition Type:  Nutrition Problem: Severe Malnutrition Etiology: acute illness   Malnutrition Characteristics:  Signs/Symptoms: energy intake < or equal to 50% for > or equal to 5 days, percent weight loss(6% weight loss within 1 month) Percent weight loss: 6 %   Nutrition Interventions:  Interventions: Ensure Enlive (each supplement provides 350kcal and 20 grams of protein)    Estimated body mass index is 25.94 kg/m as calculated from the following:   Height as of this encounter: 5\' 10"  (1.778 m).   Weight as of this encounter: 82 kg.      Code Status: Full  Family Communication: Discussed extensively with wife at bedside on 03/23/2020  Disposition Plan: Status is: Inpatient  Remains inpatient appropriate  because:Inpatient level of care appropriate due to severity of illness   Dispo: The patient is from: Home              Anticipated d/c is to: To be determined              Anticipated d/c date is: To be determined              Patient currently is not medically stable to d/c.-Due to patient requiring further  work-up, consultant signing off     Consultants:  Nephrology  PCCM  Orthopedics  Infectious disease  Procedures:  Bilateral knee joint aspiration, steroid joint injection  Antimicrobials:  Ampicillin  Ceftriaxone  DVT prophylaxis: IV Heparin   Objective: Vitals:   03/22/20 2047 03/23/20 0445 03/23/20 0850 03/23/20 1230  BP: 134/79 (!) 130/59 122/77 121/74  Pulse: 91 90 87 89  Resp: 17 18 18 18   Temp: 98.4 F (36.9 C) 98.3 F (36.8 C) 97.6 F (36.4 C) 98.7 F (37.1 C)  TempSrc: Oral Oral Oral Oral  SpO2: 100% 99% 99% 98%  Weight: 82 kg     Height:        Intake/Output Summary (Last 24 hours) at 03/23/2020 1555 Last data filed at 03/23/2020 1455 Gross per 24 hour  Intake 3021.74 ml  Output 2800 ml  Net 221.74 ml   Filed Weights   03/20/20 0500 03/21/20 0515 03/22/20 2047  Weight: 86.2 kg 81.9 kg 82 kg    Exam:  General: NAD, deconditioned  Cardiovascular: S1, S2 present  Respiratory: CTAB  Abdomen: Soft, nontender, nondistended, bowel sounds  present  Musculoskeletal: Trace bilateral pedal edema noted  Skin: Normal  Psychiatry: Normal mood  Neurology: No obvious neurologic deficits noted, although generalized weakness noted    Data Reviewed: CBC: Recent Labs  Lab 03/19/20 1801 03/20/20 0331 03/21/20 0234 03/22/20 0102 03/23/20 0641  WBC 12.6* 13.1* 9.4 13.7* 17.3*  NEUTROABS 10.3* 10.5* 6.9 10.6* 16.4*  HGB 10.4* 8.5* 8.2* 8.1* 7.4*  HCT 31.2* 24.8* 23.5* 22.8* 22.0*  MCV 89.7 87.0 87.0 86.0 88.4  PLT 341 304 305 316 [id number]   Basic Metabolic Panel: Recent Labs  Lab 03/20/20 0331 03/20/20 1517 03/21/20 0234  03/22/20 0102 03/23/20 0641  NA 133* 133* 131* 133* 136  K 4.2 4.1 3.9 4.1 4.6  CL 98 99 99 101 104  CO2 16* 18* 20* 22 20*  GLUCOSE 71 72 80 88 322*  BUN 106* 94* 78* 52* 38*  CREATININE 6.53* 5.15* 4.26* 2.62* 1.93*  CALCIUM 9.2 9.0 8.8* 8.8* 9.0   GFR: Estimated Creatinine Clearance: 38.3 mL/min (A) (by C-G formula based on SCr of 1.93 mg/dL (H)). Liver Function Tests: Recent Labs  Lab 03/19/20 1801 03/20/20 0331 03/21/20 0234 03/22/20 0102 03/23/20 0641  AST 415* 227* 648* 295* 74*  ALT 429* 302* 564* 458* 253*  ALKPHOS 158* 127* 153* 143* 117  BILITOT 5.5* 4.8* 5.3* 4.7* 3.8*  PROT 7.8 6.5 6.1* 6.3* 6.2*  ALBUMIN 2.6* 2.1* 1.9* 1.8* 1.8*   No results for input(s): LIPASE, AMYLASE in the last 168 hours. No results for input(s): AMMONIA in the last 168 hours. Coagulation Profile: Recent Labs  Lab 03/20/20 0331  INR 1.8*   Cardiac Enzymes: Recent Labs  Lab 03/19/20 1801 03/20/20 0331  CKTOTAL 20* 54   BNP (last 3 results) No results for input(s): PROBNP in the last 8760 hours. HbA1C: No results for input(s): HGBA1C in the last 72 hours. CBG: Recent Labs  Lab 03/22/20 1138 03/22/20 1618 03/22/20 2048 03/23/20 0646 03/23/20 1228  GLUCAP 114* 157* 205* 299* 344*   Lipid Profile: No results for input(s): CHOL, HDL, LDLCALC, TRIG, CHOLHDL, LDLDIRECT in the last 72 hours. Thyroid Function Tests: No results for input(s): TSH, T4TOTAL, FREET4, T3FREE, THYROIDAB in the last 72 hours. Anemia Panel: No results for input(s): VITAMINB12, FOLATE, FERRITIN, TIBC, IRON, RETICCTPCT in the last 72 hours. Urine analysis:    Component Value Date/Time   COLORURINE AMBER (A) 03/20/2020 1001   APPEARANCEUR HAZY (A) 03/20/2020 1001   LABSPEC 1.013 03/20/2020 1001   PHURINE 5.0 03/20/2020 1001   GLUCOSEU NEGATIVE 03/20/2020 1001   HGBUR SMALL (A) 03/20/2020 1001   BILIRUBINUR NEGATIVE 03/20/2020 1001   KETONESUR NEGATIVE 03/20/2020 1001   PROTEINUR 30 (A)  03/20/2020 1001   NITRITE NEGATIVE 03/20/2020 1001   LEUKOCYTESUR NEGATIVE 03/20/2020 1001   Sepsis Labs: @LABRCNTIP (procalcitonin:4,lacticidven:4)  ) Recent Results (from the past 240 hour(s))  Respiratory Panel by RT PCR (Flu A&B, Covid) - Nasopharyngeal Swab     Status: None   Collection Time: 03/19/20 10:22 PM   Specimen: Nasopharyngeal Swab  Result Value Ref Range Status   SARS Coronavirus 2 by RT PCR NEGATIVE NEGATIVE Final    Comment: (NOTE) SARS-CoV-2 target nucleic acids are NOT DETECTED. The SARS-CoV-2 RNA is generally detectable in upper respiratoy specimens during the acute phase of infection. The lowest concentration of SARS-CoV-2 viral copies this assay can detect is 131 copies/mL. A negative result does not preclude SARS-Cov-2 infection and should not be used as the sole basis for treatment or other patient management decisions.  A negative result may occur with  improper specimen collection/handling, submission of specimen other than nasopharyngeal swab, presence of viral mutation(s) within the areas targeted by this assay, and inadequate number of viral copies (<131 copies/mL). A negative result must be combined with clinical observations, patient history, and epidemiological information. The expected result is Negative. Fact Sheet for Patients:  PinkCheek.be Fact Sheet for Healthcare Providers:  GravelBags.it This test is not yet ap proved or cleared by the [place] FDA and  has been authorized for detection and/or diagnosis of SARS-CoV-2 by FDA under an Emergency Use Authorization (EUA). This EUA will remain  in effect (meaning this test can be used) for the duration of the COVID-19 declaration under Section 564(b)(1) of the Act, 21 U.S.C. section 360bbb-3(b)(1), unless the authorization is terminated or revoked sooner.    Influenza A by PCR NEGATIVE NEGATIVE Final   Influenza B by PCR NEGATIVE  NEGATIVE Final    Comment: (NOTE) The Xpert Xpress SARS-CoV-2/FLU/RSV assay is intended as an aid in  the diagnosis of influenza from Nasopharyngeal swab specimens and  should not be used as a sole basis for treatment. Nasal washings and  aspirates are unacceptable for Xpert Xpress SARS-CoV-2/FLU/RSV  testing. Fact Sheet for Patients: PinkCheek.be Fact Sheet for Healthcare Providers: GravelBags.it This test is not yet approved or cleared by the [place] FDA and  has been authorized for detection and/or diagnosis of SARS-CoV-2 by  FDA under an Emergency Use Authorization (EUA). This EUA will remain  in effect (meaning this test can be used) for the duration of the  Covid-19 declaration under Section 564(b)(1) of the Act, 21  U.S.C. section 360bbb-3(b)(1), unless the authorization is  terminated or revoked. Performed at [facility], [place] [address], [place], [place] [address]   Urine culture     Status: None   Collection Time: 03/19/20 10:39 PM   Specimen: Urine, Random  Result Value Ref Range Status   Specimen Description URINE, RANDOM  Final   Special Requests NONE  Final   Culture   Final    NO GROWTH Performed at [facility], [place] [address], [place], [place] [address]    Report Status 03/20/2020 FINAL  Final  Culture, blood (routine x 2)     Status: Abnormal   Collection Time: 03/20/20  7:45 AM   Specimen: BLOOD RIGHT FOREARM  Result Value Ref Range Status   Specimen Description BLOOD RIGHT FOREARM  Final   Special Requests   Final    BOTTLES DRAWN AEROBIC ONLY Blood Culture results may not be optimal due to an inadequate volume of blood received in culture bottles   Culture  Setup Time   Final    AEROBIC BOTTLE ONLY GRAM POSITIVE COCCI CRITICAL RESULT CALLED TO, READ BACK BY AND VERIFIED WITH: K AMEND [facility] 03/21/20 0059 JDW Performed at [facility], [place] [address], [place], [place]  [address]    Culture ENTEROCOCCUS FAECALIS (A)  Final   Report Status 03/22/2020 FINAL  Final   Organism ID, Bacteria ENTEROCOCCUS FAECALIS  Final      Susceptibility   Enterococcus faecalis - MIC*    AMPICILLIN <=2 SENSITIVE Sensitive     VANCOMYCIN 1 SENSITIVE Sensitive     GENTAMICIN SYNERGY SENSITIVE Sensitive     * ENTEROCOCCUS FAECALIS  Blood Culture ID Panel (Reflexed)     Status: Abnormal   Collection Time: 03/20/20  7:45 AM  Result Value Ref Range Status   Enterococcus  species DETECTED (A) NOT DETECTED Final    Comment: CRITICAL RESULT CALLED TO, READ BACK BY AND VERIFIED WITH: K AMEND PHARMD 03/21/20 0059 JDW    Vancomycin resistance NOT DETECTED NOT DETECTED Final   Listeria monocytogenes NOT DETECTED NOT DETECTED Final   Staphylococcus species NOT DETECTED NOT DETECTED Final   Staphylococcus aureus (BCID) NOT DETECTED NOT DETECTED Final   Streptococcus species NOT DETECTED NOT DETECTED Final   Streptococcus agalactiae NOT DETECTED NOT DETECTED Final   Streptococcus pneumoniae NOT DETECTED NOT DETECTED Final   Streptococcus pyogenes NOT DETECTED NOT DETECTED Final   Acinetobacter baumannii NOT DETECTED NOT DETECTED Final   Enterobacteriaceae species NOT DETECTED NOT DETECTED Final   Enterobacter cloacae complex NOT DETECTED NOT DETECTED Final   Escherichia coli NOT DETECTED NOT DETECTED Final   Klebsiella oxytoca NOT DETECTED NOT DETECTED Final   Klebsiella pneumoniae NOT DETECTED NOT DETECTED Final   Proteus species NOT DETECTED NOT DETECTED Final   Serratia marcescens NOT DETECTED NOT DETECTED Final   Haemophilus influenzae NOT DETECTED NOT DETECTED Final   Neisseria meningitidis NOT DETECTED NOT DETECTED Final   Pseudomonas aeruginosa NOT DETECTED NOT DETECTED Final   Candida albicans NOT DETECTED NOT DETECTED Final   Candida glabrata NOT DETECTED NOT DETECTED Final   Candida krusei NOT DETECTED NOT DETECTED Final   Candida parapsilosis NOT DETECTED NOT DETECTED Final    Candida tropicalis NOT DETECTED NOT DETECTED Final    Comment: Performed at [facility], [place]. [address], [place], [place] [address]  Culture, blood (routine x 2)     Status: Abnormal   Collection Time: 03/20/20  8:55 AM   Specimen: BLOOD  Result Value Ref Range Status   Specimen Description BLOOD LEFT ANTECUBITAL  Final   Special Requests   Final    BOTTLES DRAWN AEROBIC AND ANAEROBIC Blood Culture results may not be optimal due to an inadequate volume of blood received in culture bottles   Culture  Setup Time   Final    IN BOTH AEROBIC AND ANAEROBIC BOTTLES GRAM POSITIVE COCCI CRITICAL VALUE NOTED.  VALUE IS CONSISTENT WITH PREVIOUSLY REPORTED AND CALLED VALUE.    Culture (A)  Final    ENTEROCOCCUS FAECALIS SUSCEPTIBILITIES PERFORMED ON PREVIOUS CULTURE WITHIN THE LAST 5 DAYS. Performed at [facility], [place] [address], [place], [place] [address]    Report Status 03/22/2020 FINAL  Final  Body fluid culture     Status: None (Preliminary result)   Collection Time: 03/21/20  4:42 PM   Specimen: Body Fluid  Result Value Ref Range Status   Specimen Description FLUID SYNOVIAL LEFT KNEE  Final   Special Requests SYRINGE  Final   Gram Stain   Final    FEW WBC PRESENT,BOTH PMN AND MONONUCLEAR NO ORGANISMS SEEN Performed at [facility], [address], [place], [place] [address]    Culture RARE ENTEROCOCCUS FAECALIS  Final   Report Status PENDING  Incomplete  Gram stain     Status: None   Collection Time: 03/22/20  8:36 PM   Specimen: Synovium; Body Fluid  Result Value Ref Range Status   Specimen Description SYNOVIAL RIGHT KNEE  Final   Special Requests NONE  Final   Gram Stain   Final    ABUNDANT WBC PRESENT, PREDOMINANTLY PMN NO ORGANISMS SEEN Performed at [facility], [address], [place],  [address]    Report Status 03/22/2020 FINAL  Final  Culture, body fluid-bottle  Status: None (Preliminary result)   Collection Time: 03/22/20  8:42  PM   Specimen: Synovium  Result Value Ref Range Status   Specimen Description SYNOVIAL RIGHT KNEE  Final   Special Requests   Final    BOTTLES DRAWN AEROBIC ONLY Blood Culture adequate volume   Culture   Final    NO GROWTH < 12 HOURS Performed at [facility], [address], [place], [place] [address]    Report Status PENDING  Incomplete      Studies: MR CERVICAL SPINE WO CONTRAST  Result Date: 03/23/2020 CLINICAL DATA:  Enterococcal bacteremia and possible aortic valve endocarditis. History of prior cervical spine surgery, most recently 03/02/2020 when the patient underwent C3-4 ACDF. Severe neck pain. EXAM: MRI CERVICAL SPINE WITHOUT CONTRAST TECHNIQUE: Multiplanar, multisequence MR imaging of the cervical spine was performed. No intravenous contrast was administered. COMPARISON:  Cervical spine CT scan 03/19/2020. MRI cervical spine 10/28/2017. Postmyelogram CT scan 12/27/2019. FINDINGS: Alignment: Maintained. Vertebrae: Again seen is postoperative change of C3-6 fusion. Hardware at C4-5 and C5-6 has been removed. Anterior plate and screws are in place at C3-4. No evidence of discitis or osteomyelitis. No fracture or worrisome lesion. Cord: Patient motion somewhat limits evaluation. There is a punctate focus of signal abnormality in the left hemicord at C4-5 compatible with myelomalacia. There may be mild signal abnormality within the cord at C3-4. No epidural abscess. Posterior Fossa, vertebral arteries, paraspinal tissues: Negative. Disc levels: C2-3: Shallow disc osteophyte complex and right worse than left facet arthropathy. There is some ligamentum flavum thickening. Mild central canal and mild to moderate right foraminal stenosis. Left foramen open. No change. C3-4: Status post discectomy and fusion since the prior postmyelogram CT. There is severe facet degenerative disease on the left with marrow edema in the facets. Cord flattening seen on the prior examination appears resolved.  The foramina appear open. Motion degrades evaluation. C4-5: Status post discectomy and fusion.  No stenosis. C5-6: Status post discectomy and fusion.  No stenosis. C6-7: Minimal bulge without stenosis. C7-T1: Shallow disc osteophyte complex and uncovertebral disease. No stenosis. T1-2: Disc bulge without stenosis. This level is imaged in the sagittal plane only. IMPRESSION: Negative for evidence of infection. Status post C3-4 discectomy and fusion. Central canal stenosis seen on the preoperative examination is improved. Marked facet degenerative change on the left with associated marrow edema noted. Although motion somewhat limits evaluation, there appears to be a small focus of myelomalacia in the left cord at C4-5. Electronically Signed   By: Inge Rise M.D.   On: 03/23/2020 11:59    Scheduled Meds: . bupivacaine  10 mL Infiltration Once  . feeding supplement (ENSURE ENLIVE)  237 mL Oral TID BM  . insulin aspart  0-15 Units Subcutaneous TID WC  . insulin aspart  0-5 Units Subcutaneous QHS  . iron polysaccharides  150 mg Oral Q breakfast  . levothyroxine  25 mcg Oral Q0600  . methylPREDNISolone acetate  40 mg Intra-articular Once  . methylPREDNISolone acetate  40 mg Intra-articular Once  . midodrine  10 mg Oral TID WC  . sodium chloride flush  3 mL Intravenous Once    Continuous Infusions: . ampicillin (OMNIPEN) IV 2 g (03/23/20 0927)  . cefTRIAXone (ROCEPHIN)  IV 2 g (03/23/20 1455)  . heparin 1,400 Units/hr (03/23/20 0926)     LOS: 3 days     Alma Friendly, MD [facility]  If 7PM-7AM, please contact night-coverage [email or web address] 03/23/2020, 3:55 PM

## 2020-03-24 ENCOUNTER — Inpatient Hospital Stay (HOSPITAL_COMMUNITY): Payer: BC Managed Care – PPO | Admitting: Certified Registered"

## 2020-03-24 ENCOUNTER — Encounter (HOSPITAL_COMMUNITY)
Admission: EM | Disposition: A | Discharge status: Rehabilitation Facility | Payer: Self-pay | Source: Home / Self Care | Attending: Internal Medicine

## 2020-03-24 ENCOUNTER — Encounter (HOSPITAL_COMMUNITY): Payer: Self-pay | Admitting: Internal Medicine

## 2020-03-24 DIAGNOSIS — E43 Unspecified severe protein-calorie malnutrition: Secondary | ICD-10-CM | POA: Insufficient documentation

## 2020-03-24 DIAGNOSIS — M23301 Other meniscus derangements, unspecified lateral meniscus, left knee: Secondary | ICD-10-CM | POA: Diagnosis not present

## 2020-03-24 DIAGNOSIS — E440 Moderate protein-calorie malnutrition: Secondary | ICD-10-CM | POA: Insufficient documentation

## 2020-03-24 DIAGNOSIS — M23303 Other meniscus derangements, unspecified medial meniscus, right knee: Secondary | ICD-10-CM | POA: Diagnosis not present

## 2020-03-24 DIAGNOSIS — M009 Pyogenic arthritis, unspecified: Secondary | ICD-10-CM | POA: Diagnosis not present

## 2020-03-24 DIAGNOSIS — M23300 Other meniscus derangements, unspecified lateral meniscus, right knee: Secondary | ICD-10-CM | POA: Diagnosis not present

## 2020-03-24 HISTORY — DX: Moderate protein-calorie malnutrition: E44.0

## 2020-03-24 HISTORY — PX: IRRIGATION AND DEBRIDEMENT KNEE: SHX5185

## 2020-03-24 HISTORY — DX: Unspecified severe protein-calorie malnutrition: E43

## 2020-03-24 LAB — COMPREHENSIVE METABOLIC PANEL
ALT: 189 U/L — ABNORMAL HIGH (ref 0–44)
AST: 45 U/L — ABNORMAL HIGH (ref 15–41)
Albumin: 1.9 g/dL — ABNORMAL LOW (ref 3.5–5.0)
Alkaline Phosphatase: 103 U/L (ref 38–126)
Anion gap: 11 (ref 5–15)
BUN: 44 mg/dL — ABNORMAL HIGH (ref 8–23)
CO2: 21 mmol/L — ABNORMAL LOW (ref 22–32)
Calcium: 9.2 mg/dL (ref 8.9–10.3)
Chloride: 102 mmol/L (ref 98–111)
Creatinine, Ser: 2.01 mg/dL — ABNORMAL HIGH (ref 0.61–1.24)
GFR calc Af Amer: 39 mL/min — ABNORMAL LOW (ref >60)
GFR calc non Af Amer: 33 mL/min — ABNORMAL LOW (ref >60)
Glucose, Bld: 369 mg/dL — ABNORMAL HIGH (ref 70–99)
Potassium: 4.8 mmol/L (ref 3.5–5.1)
Sodium: 134 mmol/L — ABNORMAL LOW (ref 135–145)
Total Bilirubin: 2.2 mg/dL — ABNORMAL HIGH (ref 0.3–1.2)
Total Protein: 6 g/dL — ABNORMAL LOW (ref 6.5–8.1)

## 2020-03-24 LAB — CBC WITH DIFFERENTIAL/PLATELET
Abs Immature Granulocytes: 1.54 10*3/uL — ABNORMAL HIGH (ref 0.00–0.07)
Basophils Absolute: 0.1 10*3/uL (ref 0.0–0.1)
Basophils Relative: 0 %
Eosinophils Absolute: 0 10*3/uL (ref 0.0–0.5)
Eosinophils Relative: 0 %
HCT: 21.2 % — ABNORMAL LOW (ref 39.0–52.0)
Hemoglobin: 7.1 g/dL — ABNORMAL LOW (ref 13.0–17.0)
Immature Granulocytes: 7 %
Lymphocytes Relative: 6 %
Lymphs Abs: 1.3 10*3/uL (ref 0.7–4.0)
MCH: 29.8 pg (ref 26.0–34.0)
MCHC: 33.5 g/dL (ref 30.0–36.0)
MCV: 89.1 fL (ref 80.0–100.0)
Monocytes Absolute: 0.8 10*3/uL (ref 0.1–1.0)
Monocytes Relative: 4 %
Neutro Abs: 19.7 10*3/uL — ABNORMAL HIGH (ref 1.7–7.7)
Neutrophils Relative %: 83 %
Platelets: 329 10*3/uL (ref 150–400)
RBC: 2.38 MIL/uL — ABNORMAL LOW (ref 4.22–5.81)
RDW: 15.5 % (ref 11.5–15.5)
WBC: 22.2 10*3/uL — ABNORMAL HIGH (ref 4.0–10.5)
nRBC: 0 % (ref 0.0–0.2)

## 2020-03-24 LAB — BODY FLUID CULTURE

## 2020-03-24 LAB — GLUCOSE, CAPILLARY
Glucose-Capillary: 480 mg/dL — ABNORMAL HIGH (ref 70–99)
Glucose-Capillary: 415 mg/dL — ABNORMAL HIGH (ref 70–99)
Glucose-Capillary: 299 mg/dL — ABNORMAL HIGH (ref 70–99)
Glucose-Capillary: 234 mg/dL — ABNORMAL HIGH (ref 70–99)
Glucose-Capillary: 198 mg/dL — ABNORMAL HIGH (ref 70–99)
Glucose-Capillary: 167 mg/dL — ABNORMAL HIGH (ref 70–99)
Glucose-Capillary: 229 mg/dL — ABNORMAL HIGH (ref 70–99)
Glucose-Capillary: 261 mg/dL — ABNORMAL HIGH (ref 70–99)

## 2020-03-24 LAB — APTT: aPTT: 79 seconds — ABNORMAL HIGH (ref 24–36)

## 2020-03-24 LAB — HEPARIN LEVEL (UNFRACTIONATED): Heparin Unfractionated: 1.44 IU/mL — ABNORMAL HIGH (ref 0.30–0.70)

## 2020-03-24 LAB — SURGICAL PCR SCREEN
MRSA, PCR: NEGATIVE
Staphylococcus aureus: NEGATIVE

## 2020-03-24 LAB — PREPARE RBC (CROSSMATCH)

## 2020-03-24 SURGERY — IRRIGATION AND DEBRIDEMENT KNEE
Anesthesia: General | Site: Knee | Laterality: Bilateral

## 2020-03-24 MED ORDER — DOCUSATE SODIUM 100 MG PO CAPS
100.0000 mg | ORAL_CAPSULE | Freq: Two times a day (BID) | ORAL | Status: DC
  Administered 2020-03-24 – 2020-04-07 (×28): 100 mg via ORAL
  Filled 2020-03-24 (×28): qty 1

## 2020-03-24 MED ORDER — FENTANYL CITRATE (PF) 250 MCG/5ML IJ SOLN
INTRAMUSCULAR | Status: DC | PRN
  Administered 2020-03-24 (×4): 25 ug via INTRAVENOUS
  Administered 2020-03-24: 75 ug via INTRAVENOUS
  Administered 2020-03-24: 50 ug via INTRAVENOUS
  Administered 2020-03-24: 25 ug via INTRAVENOUS

## 2020-03-24 MED ORDER — SODIUM CHLORIDE 0.9 % IR SOLN
Status: DC | PRN
  Administered 2020-03-24: 18000 mL

## 2020-03-24 MED ORDER — MAGNESIUM CITRATE PO SOLN: 1.0000 | Freq: Once | ORAL | Status: DC | PRN

## 2020-03-24 MED ORDER — 0.9 % SODIUM CHLORIDE (POUR BTL) OPTIME
TOPICAL | Status: DC | PRN
  Administered 2020-03-24: 18:00:00 1000 mL

## 2020-03-24 MED ORDER — ROCURONIUM BROMIDE 10 MG/ML (PF) SYRINGE
PREFILLED_SYRINGE | INTRAVENOUS | Status: AC
  Filled 2020-03-24: qty 10

## 2020-03-24 MED ORDER — INSULIN GLARGINE 100 UNIT/ML ~~LOC~~ SOLN: 10.0000 [IU] | Freq: Every day | SUBCUTANEOUS | Status: DC

## 2020-03-24 MED ORDER — BUPIVACAINE HCL (PF) 0.5 % IJ SOLN
INTRAMUSCULAR | Status: DC | PRN
  Administered 2020-03-24: 20 mL

## 2020-03-24 MED ORDER — POLYETHYLENE GLYCOL 3350 17 G PO PACK: 17.0000 g | PACK | Freq: Every day | ORAL | Status: DC | PRN

## 2020-03-24 MED ORDER — BUPIVACAINE HCL (PF) 0.5 % IJ SOLN
INTRAMUSCULAR | Status: AC
  Filled 2020-03-24: qty 30

## 2020-03-24 MED ORDER — SODIUM CHLORIDE 0.9% IV SOLUTION: Freq: Once | INTRAVENOUS | Status: DC

## 2020-03-24 MED ORDER — PROPOFOL 10 MG/ML IV BOLUS
INTRAVENOUS | Status: AC
  Filled 2020-03-24: qty 20

## 2020-03-24 MED ORDER — SENNA 8.6 MG PO TABS
1.0000 | ORAL_TABLET | Freq: Two times a day (BID) | ORAL | Status: DC
  Administered 2020-03-24 – 2020-04-07 (×28): 8.6 mg via ORAL
  Filled 2020-03-24 (×28): qty 1

## 2020-03-24 MED ORDER — INSULIN GLARGINE 100 UNIT/ML ~~LOC~~ SOLN
10.0000 [IU] | Freq: Two times a day (BID) | SUBCUTANEOUS | Status: DC
  Administered 2020-03-24 – 2020-04-07 (×29): 10 [IU] via SUBCUTANEOUS
  Filled 2020-03-24 (×31): qty 0.1

## 2020-03-24 MED ORDER — INSULIN ASPART 100 UNIT/ML ~~LOC~~ SOLN
3.0000 [IU] | Freq: Three times a day (TID) | SUBCUTANEOUS | Status: DC
  Administered 2020-03-25 – 2020-04-07 (×30): 3 [IU] via SUBCUTANEOUS

## 2020-03-24 MED ORDER — LIDOCAINE 2% (20 MG/ML) 5 ML SYRINGE
INTRAMUSCULAR | Status: DC | PRN
  Administered 2020-03-24: 40 mg via INTRAVENOUS

## 2020-03-24 MED ORDER — METOCLOPRAMIDE HCL 5 MG/ML IJ SOLN: 5.0000 mg | Freq: Three times a day (TID) | INTRAMUSCULAR | Status: DC | PRN

## 2020-03-24 MED ORDER — SODIUM CHLORIDE 0.9 % IV SOLN: INTRAVENOUS | Status: DC

## 2020-03-24 MED ORDER — LIDOCAINE 2% (20 MG/ML) 5 ML SYRINGE
INTRAMUSCULAR | Status: AC
  Filled 2020-03-24: qty 5

## 2020-03-24 MED ORDER — FENTANYL CITRATE (PF) 250 MCG/5ML IJ SOLN
INTRAMUSCULAR | Status: AC
  Filled 2020-03-24: qty 5

## 2020-03-24 MED ORDER — DIPHENHYDRAMINE HCL 12.5 MG/5ML PO ELIX: 12.5000 mg | ORAL_SOLUTION | ORAL | Status: DC | PRN

## 2020-03-24 MED ORDER — DEXAMETHASONE SODIUM PHOSPHATE 10 MG/ML IJ SOLN
INTRAMUSCULAR | Status: AC
  Filled 2020-03-24: qty 1

## 2020-03-24 MED ORDER — INSULIN ASPART 100 UNIT/ML ~~LOC~~ SOLN
10.0000 [IU] | Freq: Once | SUBCUTANEOUS | Status: AC
  Administered 2020-03-24: 10 [IU] via SUBCUTANEOUS

## 2020-03-24 MED ORDER — ONDANSETRON HCL 4 MG/2ML IJ SOLN: 4.0000 mg | Freq: Four times a day (QID) | INTRAMUSCULAR | Status: DC | PRN

## 2020-03-24 MED ORDER — ONDANSETRON HCL 4 MG/2ML IJ SOLN
INTRAMUSCULAR | Status: DC | PRN
  Administered 2020-03-24: 4 mg via INTRAVENOUS

## 2020-03-24 MED ORDER — METOCLOPRAMIDE HCL 5 MG PO TABS: 5.0000 mg | ORAL_TABLET | Freq: Three times a day (TID) | ORAL | Status: DC | PRN

## 2020-03-24 MED ORDER — ONDANSETRON HCL 4 MG PO TABS: 4.0000 mg | ORAL_TABLET | Freq: Four times a day (QID) | ORAL | Status: DC | PRN

## 2020-03-24 MED ORDER — LACTATED RINGERS IV SOLN: INTRAVENOUS | Status: DC | PRN

## 2020-03-24 MED ORDER — HEPARIN (PORCINE) 25000 UT/250ML-% IV SOLN
1500.0000 [IU]/h | INTRAVENOUS | Status: DC
  Administered 2020-03-25: 1400 [IU]/h via INTRAVENOUS
  Administered 2020-03-25: 1500 [IU]/h via INTRAVENOUS
  Filled 2020-03-24 (×2): qty 250

## 2020-03-24 MED ORDER — KETOROLAC TROMETHAMINE 30 MG/ML IJ SOLN
INTRAMUSCULAR | Status: AC
  Filled 2020-03-24: qty 1

## 2020-03-24 MED ORDER — BISACODYL 10 MG RE SUPP: 10.0000 mg | Freq: Every day | RECTAL | Status: DC | PRN

## 2020-03-24 MED ORDER — HEPARIN (PORCINE) 25000 UT/250ML-% IV SOLN: 1400.0000 [IU]/h | INTRAVENOUS | Status: DC

## 2020-03-24 MED ORDER — ONDANSETRON HCL 4 MG/2ML IJ SOLN
INTRAMUSCULAR | Status: AC
  Filled 2020-03-24: qty 2

## 2020-03-24 MED ORDER — EPHEDRINE SULFATE-NACL 50-0.9 MG/10ML-% IV SOSY
PREFILLED_SYRINGE | INTRAVENOUS | Status: DC | PRN
  Administered 2020-03-24 (×2): 5 mg via INTRAVENOUS

## 2020-03-24 MED ORDER — ESMOLOL HCL 100 MG/10ML IV SOLN
INTRAVENOUS | Status: DC | PRN
  Administered 2020-03-24 (×2): 10 mg via INTRAVENOUS

## 2020-03-24 MED ORDER — PROPOFOL 10 MG/ML IV BOLUS
INTRAVENOUS | Status: DC | PRN
  Administered 2020-03-24: 150 mg via INTRAVENOUS
  Administered 2020-03-24: 50 mg via INTRAVENOUS

## 2020-03-24 SURGICAL SUPPLY — 44 items
BNDG COHESIVE 4X5 TAN STRL (GAUZE/BANDAGES/DRESSINGS) ×4 IMPLANT
BNDG ELASTIC 4X5.8 VLCR STR LF (GAUZE/BANDAGES/DRESSINGS) ×2 IMPLANT
BNDG ELASTIC 6X15 VLCR STRL LF (GAUZE/BANDAGES/DRESSINGS) ×4 IMPLANT
BNDG ELASTIC 6X5.8 VLCR STR LF (GAUZE/BANDAGES/DRESSINGS) ×2 IMPLANT
BNDG GAUZE ELAST 4 BULKY (GAUZE/BANDAGES/DRESSINGS) IMPLANT
BOOTCOVER CLEANROOM LRG (PROTECTIVE WEAR) ×4 IMPLANT
COVER SURGICAL LIGHT HANDLE (MISCELLANEOUS) ×2 IMPLANT
CUFF TOURN SGL QUICK 34 (TOURNIQUET CUFF)
CUFF TRNQT CYL 34X4.125X (TOURNIQUET CUFF) IMPLANT
DRAPE IMP U-DRAPE 54X76 (DRAPES) ×4 IMPLANT
DRAPE STERI 35X30 U-POUCH (DRAPES) ×4 IMPLANT
DRSG XEROFORM 1X8 (GAUZE/BANDAGES/DRESSINGS) ×2 IMPLANT
DURAPREP 26ML APPLICATOR (WOUND CARE) ×4 IMPLANT
ELECT REM PT RETURN 9FT ADLT (ELECTROSURGICAL)
ELECTRODE REM PT RTRN 9FT ADLT (ELECTROSURGICAL) IMPLANT
EVACUATOR 1/8 PVC DRAIN (DRAIN) IMPLANT
GAUZE SPONGE 4X4 12PLY STRL (GAUZE/BANDAGES/DRESSINGS) ×2 IMPLANT
GAUZE XEROFORM 1X8 LF (GAUZE/BANDAGES/DRESSINGS) ×2 IMPLANT
GLOVE BIOGEL PI ORTHO PRO SZ8 (GLOVE) ×6
GLOVE ORTHO TXT STRL SZ7.5 (GLOVE) ×2 IMPLANT
GLOVE PI ORTHO PRO STRL SZ8 (GLOVE) ×6 IMPLANT
GLOVE SURG ORTHO 8.0 STRL STRW (GLOVE) ×4 IMPLANT
GOWN STRL REUS W/ TWL LRG LVL3 (GOWN DISPOSABLE) ×3 IMPLANT
GOWN STRL REUS W/TWL LRG LVL3 (GOWN DISPOSABLE) ×3
HANDPIECE INTERPULSE COAX TIP (DISPOSABLE)
KIT BASIN OR (CUSTOM PROCEDURE TRAY) ×2 IMPLANT
KIT TURNOVER KIT B (KITS) ×2 IMPLANT
MANIFOLD NEPTUNE II (INSTRUMENTS) ×4 IMPLANT
NEEDLE HYPO 22GX1.5 SAFETY (NEEDLE) ×2 IMPLANT
NS IRRIG 1000ML POUR BTL (IV SOLUTION) ×2 IMPLANT
PACK ORTHO EXTREMITY (CUSTOM PROCEDURE TRAY) ×2 IMPLANT
PAD ARMBOARD 7.5X6 YLW CONV (MISCELLANEOUS) ×4 IMPLANT
SET HNDPC FAN SPRY TIP SCT (DISPOSABLE) IMPLANT
SPONGE LAP 18X18 RF (DISPOSABLE) ×2 IMPLANT
STOCKINETTE IMPERVIOUS LG (DRAPES) ×4 IMPLANT
SUT ETHILON 3 0 PS 1 (SUTURE) IMPLANT
SWAB CULTURE ESWAB REG 1ML (MISCELLANEOUS) IMPLANT
SYR CONTROL 10ML LL (SYRINGE) ×2 IMPLANT
TOWEL GREEN STERILE (TOWEL DISPOSABLE) ×2 IMPLANT
TOWEL GREEN STERILE FF (TOWEL DISPOSABLE) ×2 IMPLANT
TUBE CONNECTING 12X1/4 (SUCTIONS) ×2 IMPLANT
UNDERPAD 30X30 (UNDERPADS AND DIAPERS) ×2 IMPLANT
WATER STERILE IRR 1000ML POUR (IV SOLUTION) IMPLANT
YANKAUER SUCT BULB TIP NO VENT (SUCTIONS) ×2 IMPLANT

## 2020-03-24 NOTE — Progress Notes (Signed)
Patients CBG at 0633 was 480. 15 units of scheduled novolog was given. Horris Latino, MD aware of this. CBG re-checked and is now 415. Horris Latino, MD made aware. MD ordered to give 10 units of novolog and re-check CBG in 2 hours. Orders followed. Will continue to monitor.

## 2020-03-24 NOTE — Progress Notes (Signed)
OT Cancellation Note  Patient Details Name: Johnathan Murray MRN: [id number] DOB: 03-03-1951   Cancelled Treatment:    Reason Eval/Treat Not Completed: Patient at procedure or test/ unavailable, will follow up for OT eval as able.  Lou Cal, OT Acute Rehabilitation Services Pager [phone] Office [place] 03/24/2020, 1:10 PM

## 2020-03-24 NOTE — Evaluation (Signed)
Physical Therapy Evaluation Patient Details Name: Johnathan Murray MRN: [id number] DOB: [birth year] Today's Date: 03/24/2020   History of Present Illness  Pt is a 69 y/o male s/p ACDF C3-4 on 4/12, admitted to [facility] on 4/30 for progressive weakness, fatigue, bilateral knee pain L>R. Pt with AKI, bacteremia, L septic knee with plan for I&D bilateral knees on 5/4 pm . PMHx includes previous ACDF C4-6 (04/2019), lumbar sx (05/2019), CAD, DM, HTN, hx of CABG  Clinical Impression   Pt presents with generalized weakness, bilateral knee pain L>R, difficulty performing bed mobility and transfer out of bed, unsteadiness in standing, and decreased activity tolerance. Pt to benefit from acute PT to address deficits. Pt required min-mod assist for bed mobility and transfer to stand, pt tolerating side steps towards HOB only on eval. Prior to admission, pt was mobilizing with RW for short distances due to feeling weak and knee pain. Prior to this after ACDF, pt was mobilizing with a cane if anything. PT expects pt to progress well with mobility post-operatively, recommending HHPT and 24/7 assist from wife while he recovers. PT to progress mobility as tolerated, and will continue to follow acutely.      Follow Up Recommendations Home health PT;Supervision/Assistance - 24 hour    Equipment Recommendations  None recommended by PT    Recommendations for Other Services       Precautions / Restrictions Precautions Precautions: Fall;Cervical Precaution Comments: ACDF on 4/12, pt reports they d/c-ed collar 1 week after surgery Restrictions Weight Bearing Restrictions: No      Mobility  Bed Mobility Overal bed mobility: Needs Assistance Bed Mobility: Supine to Sit;Sit to Supine     Supine to sit: Min assist Sit to supine: Mod assist   General bed mobility comments: min-mod assist for supine<>sit for trunk and LE management, pt requiring more assist with LE lifting with return to supine. Increased time  and effort with use of bedrails to perform.  Transfers Overall transfer level: Needs assistance Equipment used: Rolling walker (2 wheeled) Transfers: Sit to/from Stand Sit to Stand: Min assist;From elevated surface         General transfer comment: Min assist for power up, steadying. Safe hand placement not followed when cued, pt preferring to place hands on Sneeringer bar of RW to stand.  Ambulation/Gait Ambulation/Gait assistance: Min assist Gait Distance (Feet): 3 Feet Assistive device: Rolling walker (2 wheeled)   Gait velocity: slow   General Gait Details: 3 sidesteps towards HOB, min assist to steady, manage pt's RW use, and slow eccentric lower back to bed. Forward progression ambulation not tolerated this day.  Stairs            Wheelchair Mobility    Modified Rankin (Stroke Patients Only)       Balance Overall balance assessment: Needs assistance Sitting-balance support: Feet supported;No upper extremity supported Sitting balance-Leahy Scale: Fair Sitting balance - Comments: sat EOB 10 minutes in preparation for transfer to stand, for urination, without PT assist   Standing balance support: During functional activity;Bilateral upper extremity supported Standing balance-Leahy Scale: Poor Standing balance comment: reliant on external support in standing                             Pertinent Vitals/Pain Pain Assessment: Faces Faces Pain Scale: Hurts little more Pain Location: bilateral knees L>R, head and neck Pain Descriptors / Indicators: Sore;Discomfort Pain Intervention(s): Limited activity within patient's tolerance;Monitored during session;Repositioned  Home Living Family/patient expects to be discharged to:: Private residence Living Arrangements: Spouse/significant other Available Help at Discharge: Family;Available 24 hours/day Type of Home: House Home Access: Stairs to enter Entrance Stairs-Rails: Can reach both Entrance  Stairs-Number of Steps: 7 Home Layout: One level Home Equipment: Grab bars - tub/shower;Shower seat - built in;Cane - single point;Walker - 2 wheels      Prior Function Level of Independence: Needs assistance   Gait / Transfers Assistance Needed: pt reports using RW for very short distance ambulation PTA, states he had very little tolerence with periodic L knee buckling. for the first week after surgery, pt reports he was up with cane and felt he really didn't even need taht.  ADL's / Homemaking Assistance Needed: Pt's wife assisted pt with getting up in the morning, then wife would go to work so pt was home alone during the day.  Comments: pt was a truck driver prior to [employer]   Dominant Hand: Right    Extremity/Trunk Assessment   Upper Extremity Assessment Upper Extremity Assessment: Defer to OT evaluation    Lower Extremity Assessment Lower Extremity Assessment: Generalized weakness;LLE deficits/detail RLE Deficits / Details: formal MMT not assessed due to pt pain presentation, able to perform supine knee flexion to 90*, seated knee extension lacking 10* extension LLE Deficits / Details: formal MMT not assessed due to pt pain presentation, able to perform supine knee flexion to 45* VERY limited by pain, seated knee extension lacking 10* extension    Cervical / Trunk Assessment Cervical / Trunk Assessment: Other exceptions Cervical / Trunk Exceptions: s/p ACDF on 4/12  Communication   Communication: No difficulties  Cognition Arousal/Alertness: Awake/alert Behavior During Therapy: Flat affect Overall Cognitive Status: Within Functional Limits for tasks assessed                                 General Comments: Pt in pain presenting with flat affect. Pt follows commands well with increased time this day, requires sequencing assist for mobility.      General Comments      Exercises     Assessment/Plan    PT Assessment Patient needs  continued PT services  PT Problem List Decreased strength;Decreased range of motion;Decreased activity tolerance;Decreased balance;Decreased mobility;Decreased coordination;Decreased knowledge of use of DME;Decreased safety awareness;Decreased knowledge of precautions;Pain       PT Treatment Interventions DME instruction;Gait training;Stair training;Therapeutic exercise;Therapeutic activities;Functional mobility training;Balance training;Neuromuscular re-education;Patient/family education    PT Goals (Current goals can be found in the Care Plan section)  Acute Rehab PT Goals Patient Stated Goal: go home, feel stronger PT Goal Formulation: With patient Time For Goal Achievement: 04/07/20 Potential to Achieve Goals: Good    Frequency Min 3X/week   Barriers to discharge        Co-evaluation               AM-PAC PT "6 Clicks" Mobility  Outcome Measure Help needed turning from your back to your side while in a flat bed without using bedrails?: A Little Help needed moving from lying on your back to sitting on the side of a flat bed without using bedrails?: A Lot Help needed moving to and from a bed to a chair (including a wheelchair)?: A Lot Help needed standing up from a chair using your arms (e.g., wheelchair or bedside chair)?: A Lot Help needed to walk in hospital room?: A Lot  Help needed climbing 3-5 steps with a railing? : A Lot 6 Click Score: 13    End of Session   Activity Tolerance: Patient limited by fatigue;Patient limited by pain Patient left: with call bell/phone within reach;in bed;with family/visitor present;Other (comment)(with lab tech in room for lab draw) Nurse Communication: Mobility status PT Visit Diagnosis: Muscle weakness (generalized) (M62.81);Difficulty in walking, not elsewhere classified (R26.2)    Time: [id number] PT Time Calculation (min) (ACUTE ONLY): 26 min   Charges:   PT Evaluation $PT Eval Low Complexity: 1 Low PT  Treatments $Therapeutic Activity: 8-22 mins       Kyce Ging E, PT Acute Rehabilitation Services Pager [phone]  Office Mier D Naim Murtha 03/24/2020, 12:06 PM

## 2020-03-24 NOTE — Anesthesia Procedure Notes (Signed)
Procedure Name: LMA Insertion Performed by: Darreld Hoffer H, CRNA Pre-anesthesia Checklist: Patient identified, Emergency Drugs available, Suction available and Patient being monitored Patient Re-evaluated:Patient Re-evaluated prior to induction Oxygen Delivery Method: Circle System Utilized Preoxygenation: Pre-oxygenation with 100% oxygen Induction Type: IV induction Ventilation: Mask ventilation without difficulty LMA: LMA inserted LMA Size: 5.0 Number of attempts: 1 Airway Equipment and Method: Bite block Placement Confirmation: positive ETCO2 Tube secured with: Tape Dental Injury: Teeth and Oropharynx as per pre-operative assessment        

## 2020-03-24 NOTE — TOC Initial Note (Signed)
Transition of Care [facility]) - Initial/Assessment Note    Patient Details  Name: Johnathan Murray MRN: [id number] Date of Birth: [birth year]  Transition of Care [facility]) CM/SW Contact:    Carles Collet, RN Phone Number: 03/24/2020, 11:03 AM  Clinical Narrative:                 Damaris Schooner w patient and spouse at bedside. [place] with potential needs of home IV Abx and [place] RN. Continued workup including plan for irrigation and debridement of bilateral knees with Dr. Mardelle Matte today. Spoke w Pam of [facility] infusion to follow for potential home IV Abx needs. Medicare list for [facility] services provided to wife and [place] chosen, with [facility] as second choice. Patient and wife live in [place], Referral made to [place], liaison to check with [place] branch to assess for coverage.  Expected Discharge Plan: [place] Barriers to Discharge: Continued Medical Work up   Patient Goals and CMS Choice Patient states their goals for this hospitalization and ongoing recovery are:: to go home CMS Medicare.gov Compare Post Acute Care list provided to:: Patient Choice offered to / list presented to : Patient, Spouse  Expected Discharge Plan and Services Expected Discharge Plan: [place] Acute Care Choice: [place] arrangements for the past 2 months: Single Family Home                           HH Arranged: RN [place] Agency: [place] Date [facility] Agency Contacted: 03/24/20 Time HH Agency Contacted: 32 Representative spoke with at [place]: Tommi Rumps  Prior Living Arrangements/Services Living arrangements for the past 2 months: [place] with:: Spouse                   Activities of Daily Living Home Assistive Devices/Equipment: Eyeglasses, Dentures (specify type) ADL Screening (condition at time of admission) Patient's cognitive ability adequate to safely complete daily activities?: Yes Is the patient deaf or have  difficulty hearing?: No Does the patient have difficulty seeing, even when wearing glasses/contacts?: Yes Does the patient have difficulty concentrating, remembering, or making decisions?: No Patient able to express need for assistance with ADLs?: Yes Does the patient have difficulty dressing or bathing?: Yes Independently performs ADLs?: Yes (appropriate for developmental age) Communication: Independent Dressing (OT): Needs assistance Is this a change from baseline?: Pre-admission baseline Grooming: Independent Feeding: Independent Bathing: Needs assistance Is this a change from baseline?: Change from baseline, expected to last <3 days Toileting: Needs assistance Is this a change from baseline?: Pre-admission baseline In/Out Bed: Needs assistance Is this a change from baseline?: Pre-admission baseline Does the patient have difficulty walking or climbing stairs?: Yes Weakness of Legs: Both Weakness of Arms/Hands: Both  Permission Sought/Granted                  Emotional Assessment              Admission diagnosis:  ARF (acute renal failure) ([place]) [N17.9] Serum total bilirubin elevated [R17] Elevated LFTs [R79.89] AKI (acute kidney injury) ([place]) [N17.9] Acute kidney injury ([place]) [N17.9] Acute renal failure, unspecified acute renal failure type ([place]) [N17.9] Patient Active Problem List   Diagnosis Date Noted  . Protein-calorie malnutrition, severe 03/24/2020  . Septic arthritis ([place]) 03/23/2020  . History of fusion of cervical spine 03/23/2020  . Enterococcal bacteremia 03/21/2020  . Pseudogout of left knee 03/21/2020  .  ARF (acute renal failure) ([place]) 03/20/2020  . S/P aortic valve replacement 01/17/2020  . Renal insufficiency 01/17/2020  . Pre-operative cardiovascular examination 01/03/2020  . Acute otitis externa of left ear 11/13/2019  . Acute suppurative otitis media of left ear without spontaneous rupture of tympanic membrane 11/13/2019  . Conductive  hearing loss of left ear 11/13/2019  . Tinnitus of left ear 11/13/2019  . Lumbar stenosis with neurogenic claudication 06/11/2019  . Cervical myelopathy ([place]) 04/22/2019  . Preoperative cardiovascular examination 04/17/2019  . Intermittent claudication ([place]) 10/31/2018  . Coronary artery disease of native artery of native heart with stable angina pectoris ([place])   . Abnormal nuclear stress test 05/03/2018  . Aortic stenosis 05/03/2018  . Acid reflux 02/15/2018  . Arthritis 02/15/2018  . Hyperactive gag reflex 02/15/2018  . Macular degeneration 02/15/2018  . Abnormal LFTs (liver function tests) 10/31/2016  . Coronary artery disease involving native coronary artery of native heart without angina pectoris 05/30/2016  . Diabetes mellitus due to underlying condition with unspecified complications ([place]) [id number]  . Dyslipidemia 05/30/2016  . Essential hypertension 05/30/2016  . PAF (paroxysmal atrial fibrillation) ([place]) 05/30/2016   PCP:  Nicoletta Dress, MD Pharmacy:   [id number] - [place], [address] [place] [address] Phone: [phone] Fax: [phone]     Social Determinants of Health (SDOH) Interventions    Readmission Risk Interventions No flowsheet data found.

## 2020-03-24 NOTE — Op Note (Addendum)
03/19/2020 - 03/24/2020  6:28 PM  PATIENT:  Johnathan Murray    PRE-OPERATIVE DIAGNOSIS: Bilateral septic knee  POST-OPERATIVE DIAGNOSIS:  Same bilateral knee end-stage medial and lateral meniscus tears with ACL dysfunction  PROCEDURE:    1.  Bilateral knee irrigation irrigation debridement arthroscopic.  For lavage of infectious arthritis  2.  Bilateral knee medial and lateral menisectomies.  ATTENDING SURGEON:  Johnny Bridge, MD  PHYSICIAN ASSISTANT: Merlene Pulling, PA-C, present and scrubbed throughout the case, critical for completion in a timely fashion, and for retraction, instrumentation, and closure.  ANESTHESIA:   General  PREOPERATIVE INDICATIONS:  Johnathan Murray is a  69 y.o. male with a diagnosis of SEPTIC ARTHRITIS OF THE KNEE who elected for surgical management.    The risks benefits and alternatives were discussed with the patient preoperatively including but not limited to the risks of infection, bleeding, nerve injury, cardiopulmonary complications, the need for revision surgery, persistent infection, among others, and the patient was willing to proceed.  ESTIMATED BLOOD LOSS: minimal  OPERATIVE IMPLANTS: none  OPERATIVE FINDINGS: He had fibrinous debris in both knees, with severe patellofemoral degenerative changes in both knees, both ACL's were fairly poor quality, he had meniscal fraying on both sides on both knees, although the lateral meniscus on the left knee was really the worst, I did have to use a biter for that 1.  OPERATIVE PROCEDURE:   The patient brought to the operating room and placed in supine position.  General anesthesia was administered.  IV antibiotics were given.  Both lower extremities were prepped and draped in usual sterile fashion.  He was on the perioperative antibiotic regimen that he had already been prescribed for his systemic sepsis.  Timeout was performed and then diagnostic arthroscopy carried out in the right knee first.  I did  that because the right knee was less involved than the left.  There was a fair amount of fibrinous exudate with purulent material, I did not take cultures because we had already confirmed Enterococcus as the offending agent.  I irrigated a total of 9 L of fluid through all of the compartments of the knee, and debrided the ACL fibrinous material as well as the medial and lateral meniscus with a shaver.  I then repaired the portals with nylon.  I went to the other knee, and performed the same operation, irrigating 9 L of fluid throughout the knee.  I used a basket to debride the lateral meniscus.  I also cleaned all of the gutters, as well as the posterior compartment.  Once complete debridement been carried out, the nylon sutures were used to close the portals on the left side, then both knees were dressed with sterile gauze and he was awakened and returned to the PACU in stable and satisfactory condition.  There were no complications and he tolerated the procedure well.  Plan to restart heparin tomorrow morning.  WBAT, PT.

## 2020-03-24 NOTE — Transfer of Care (Signed)
Immediate Anesthesia Transfer of Care Note  Patient: Johnathan Murray  Procedure(s) Performed: ARTHROSCOPY IRRIGATION AND DEBRIDEMENT BILATERAL  KNEE (Bilateral Knee)  Patient Location: PACU  Anesthesia Type:General  Level of Consciousness: awake  Airway & Oxygen Therapy: Patient Spontanous Breathing and Patient connected to face mask oxygen  Post-op Assessment: Report given to RN  Post vital signs: Reviewed and stable  Last Vitals:  Vitals Value Taken Time  BP 135/78 03/24/20 1841  Temp 36.1 C 03/24/20 1841  Pulse 71 03/24/20 1842  Resp 19 03/24/20 1842  SpO2 100 % 03/24/20 1842  Vitals shown include unvalidated device data.  Last Pain:  Vitals:   03/24/20 1551  TempSrc:   PainSc: 7       Patients Stated Pain Goal: 2 ([id number])  Complications: No apparent anesthesia complications

## 2020-03-24 NOTE — Progress Notes (Signed)
ANTICOAGULATION CONSULT NOTE - Follow Up Consult  Pharmacy Consult for Heparin Indication: atrial fibrillation (while apixaban on hold)  Allergies  Allergen Reactions  . Atorvastatin Other (See Comments)    Urinary retention   . Nebivolol Diarrhea    Patient Measurements: Height: 5\' 10"  (177.8 cm) Weight: 78.5 kg (173 lb) IBW/kg (Calculated) : 73 Heparin Dosing Weight: 86.2  Vital Signs: Temp: 97.6 F (36.4 C) (05/04 1926) Temp Source: Oral (05/04 0941) BP: 132/61 (05/04 2024) Pulse Rate: 68 (05/04 2024)  Labs: Recent Labs    03/22/20 0102 03/22/20 0102 03/23/20 0641 03/23/20 1712 03/24/20 0410  HGB 8.1*   < > 7.4*  --  7.1*  HCT 22.8*  --  22.0*  --  21.2*  PLT 316  --  303  --  329  APTT 76*   < > 113* 79* 79*  HEPARINUNFRC 2.16*  --  1.86*  --  1.44*  CREATININE 2.62*  --  1.93*  --  2.01*   < > = values in this interval not displayed.    Estimated Creatinine Clearance: 36.8 mL/min (A) (by C-G formula based on SCr of 2.01 mg/dL (H)).   Medications:  Scheduled:  . docusate sodium  100 mg Oral BID  . feeding supplement (ENSURE ENLIVE)  237 mL Oral TID BM  . insulin aspart  0-15 Units Subcutaneous TID WC  . insulin aspart  0-5 Units Subcutaneous QHS  . insulin aspart  3 Units Subcutaneous TID WC  . insulin glargine  10 Units Subcutaneous BID  . iron polysaccharides  150 mg Oral Q breakfast  . levothyroxine  25 mcg Oral Q0600  . midodrine  10 mg Oral TID WC  . senna  1 tablet Oral BID   Infusions:  . ampicillin (OMNIPEN) IV 2 g (03/24/20 2136)  . cefTRIAXone (ROCEPHIN)  IV 2 g (03/24/20 0209)    Assessment: 69 yo M on apixaban PTA for afib.  Apixiban held on admission due to AKI.  Pt continues on heparin infusion managed by aPTTs because apixaban use can falsely elevate heparin levels.   Targeting lower aPTT goal given recent surgery.  03/24/20 9:40 PM  - patient is s/p I/D of knee - OK to resume heparin in AM per surgical PA  Goal of Therapy:   Heparin level 0.3-0.5 units/ml aPTT 66-85 seconds Monitor platelets by anticoagulation protocol: Yes   Plan:  Resume heparin at 0600 at previous rate of 1400 units/hr without bolus Next aPTT/heparin level at [place], PharmD, BCPS 03/24/2020 9:37 PM

## 2020-03-24 NOTE — Progress Notes (Signed)
Patient seen and examined, he has acute on chronic bilateral knee pain left greater than right.  He has had a long history of arthritis and injections in the left knee, but recently has developed more significant swelling and pain.  Initially aspiration demonstrated intracellular calcium pyrophosphate crystals with only 15,000 white blood cells, that was consistent with pseudogout, however subsequently, his Gram stain and culture and sensitivity have grown out Enterococcus, which is consistent with his bacteremia.  Therefore I am recommending surgical irrigation and debridement arthroscopically of both knees.  It will be up to be determined how long he will have to wait after clearing his infection before considering knee arthroplasty, which apparently he has already been told that he needed.  Nonetheless the risks benefits and alternatives been discussed at length including but not limited to the risks of infection, bleeding, nerve injury, osteomyelitis, even the potential for amputation, long-term antibiotics, the potential need for revision arthroscopy among others and he is willing to proceed.  Johnny Bridge, MD

## 2020-03-24 NOTE — Anesthesia Postprocedure Evaluation (Signed)
Anesthesia Post Note  Patient: Johnathan Murray  Procedure(s) Performed: ARTHROSCOPY IRRIGATION AND DEBRIDEMENT BILATERAL  KNEE (Bilateral Knee)     Patient location during evaluation: PACU Anesthesia Type: General Level of consciousness: oriented, sedated and patient cooperative Pain management: pain level controlled Vital Signs Assessment: post-procedure vital signs reviewed and stable Respiratory status: spontaneous breathing, nonlabored ventilation, respiratory function stable and patient connected to nasal cannula oxygen Cardiovascular status: blood pressure returned to baseline and stable Postop Assessment: no apparent nausea or vomiting Anesthetic complications: no    Last Vitals:  Vitals:   03/24/20 1551 03/24/20 1841  BP: 138/68 135/78  Pulse: 80 79  Resp: 16 12  Temp:  (!) 36.1 C  SpO2: 100% 100%    Last Pain:  Vitals:   03/24/20 1841  TempSrc:   PainSc: Asleep    LLE Motor Response: Purposeful movement;Responds to commands (03/24/20 1841) LLE Sensation: Full sensation (03/24/20 1841) RLE Motor Response: Purposeful movement;Responds to commands (03/24/20 1841) RLE Sensation: Full sensation (03/24/20 1841)      Seleta Rhymes. Shatiqua Heroux

## 2020-03-24 NOTE — Anesthesia Preprocedure Evaluation (Addendum)
Anesthesia Evaluation  Patient identified by MRN, date of birth, ID band Patient awake    Reviewed: Allergy & Precautions, NPO status , Patient's Chart, lab work & pertinent test results  History of Anesthesia Complications Negative for: history of anesthetic complications  Airway Mallampati: II  TM Distance: >3 FB Neck ROM: Limited    Dental  (+) Edentulous Upper, Partial Lower, Dental Advisory Given, Poor Dentition   Pulmonary  03/19/2020 SARS coronavirus NEG    + decreased breath sounds      Cardiovascular hypertension, Pt. on medications and Pt. on home beta blockers (-) angina+ CAD (01/2020 cath: Patent LIMA to the LAD.) and + CABG  + dysrhythmias Atrial Fibrillation + Valvular Problems/Murmurs (s/p AVR 12/2019)  Rhythm:Regular Rate:Normal  03/22/2020 ECHO: EF 55-60%, mean grad across prosthetic Aortic valve 28 mmHg, no obvious vegetation   Neuro/Psych S/p ACDF     GI/Hepatic GERD  Controlled,H/o elevated LFTs   Endo/Other  diabetes (glu 198), Oral Hypoglycemic AgentsHypothyroidism   Renal/GU Renal InsufficiencyRenal disease (creat 2.01)     Musculoskeletal   Abdominal   Peds  Hematology  (+) Blood dyscrasia (Hb 7.1), anemia , eliquis   Anesthesia Other Findings   Reproductive/Obstetrics                           Anesthesia Physical Anesthesia Plan  ASA: III  Anesthesia Plan: General   Post-op Pain Management:    Induction: Intravenous  PONV Risk Score and Plan: Ondansetron  Airway Management Planned: LMA  Additional Equipment: None  Intra-op Plan:   Post-operative Plan: Extubation in OR  Informed Consent: I have reviewed the patients History and Physical, chart, labs and discussed the procedure including the risks, benefits and alternatives for the proposed anesthesia with the patient or authorized representative who has indicated his/her understanding and acceptance.      Dental advisory given  Plan Discussed with: Anesthesiologist, CRNA and Surgeon  Anesthesia Plan Comments: (Enterococcus sepsis Acute renal insufficiency creatinie 2.0 Anemia H/H- 7.1/21.2 S/P Aortic valve replacement 2017 S/P anterior cervical fusion   Plan GA with oral ETT, 2 units PRBCs available  Roberts Gaudy)       Anesthesia Quick Evaluation

## 2020-03-24 NOTE — Progress Notes (Signed)
PROGRESS NOTE  Johnathan Murray [id number] DOB: 04/17/1951 DOA: 03/19/2020 PCP: Nicoletta Dress, MD  HPI/Recap of past 24 hours: HPI from Dr Julio Sicks is a 69 y.o. male with history of paroxysmal atrial fibrillation, hypertension, bioprosthetic aortic valve replacement who had undergone recent cervical discectomy and fusion about 3 weeks ago done by Dr. Trenton Gammon has been feeling weak with increasing pain in the neck and low back and poor appetite.  Denies any vomiting or diarrhea denies taking any Tylenol but does take NSAIDs sometimes.  Has not noticed any blood in the urine and has been urinating less last few days.  Has subjective feeling of fever chills.  Given the symptoms patient presents to the ER.  Patient states over the last 3 weeks and surgery patient has been finding it difficult to ambulate because of the weakness of the lower extremity.  Denies any incontinence of urine or bowel.  Has been having more lower extremity edema more on the left side. In the ER on exam patient appears generally weak with weakness of the lower extremity.  There is edema of the lower extremity on the left side.  Labs show creatinine of 6.4 which is increased from 1.6 about 3 weeks ago.  In addition patient's LFTs are markedly elevated at AST of 415 and ALT of 429 patient's albumin is 2.6 total bilirubin 5.5 bicarb 19 sodium 130 calcium 10.2 hemoglobin 10.4 lactic acid 1.6 WBC 12.6 platelets 341 urinalysis shows RBCs of 21-50 WBC 21-50 protein around 100 bacteria rare hyaline cast present mucus present. Patient was given fluid bolus and admitted for acute renal failure.  Patient also had CT abdomen pelvis and CT cervical spine which did not show any acute. USS of the abdomen also was unremarkable.  Covid test is negative.      Today, patient still reports generalized weakness, with bilateral knee pain, reports appetite improving a little bit, still with chronic back and neck pain,  denies any chest pain, shortness of breath, abdominal pain, nausea/vomiting, fever/chills.       Assessment/Plan: Principal Problem:   Enterococcal bacteremia Active Problems:   Essential hypertension   PAF (paroxysmal atrial fibrillation) (HCC)   Abnormal LFTs (liver function tests)   Cervical myelopathy (HCC)   S/P aortic valve replacement   ARF (acute renal failure) (HCC)   Pseudogout of left knee   Septic arthritis (HCC)   History of fusion of cervical spine   Protein-calorie malnutrition, severe   AKI on CKD stage III/anion gap metabolic acidosis Improving Multifactorial from bacteremia, poor oral intake, in addition to medication induced from NSAID, Lasix and lisinopril use Baseline creatinine 1.3-1.6, on presentation 6.40 Noted urine with RBCs and protein-further work-up Per nephrology Discontinued IV fluids Hold patient's Lasix, lisinopril CT abdomen does not show any obstruction Nephrology consulted, signed off Daily BMP  Sepsis likely 2/2 Enterococcus faecalis bacteremia On presentation, patient was hypotensive, with leukocytosis Source from possible knee infection vs recent postoperative cervical/lumbar infection Afebrile, with leukocytosis (steroid in knee) BC x2 growing Enterococcus faecalis, repeat BC x2 NGTD UC no growth, Chest x-ray unremarkable Lactic acid, TSH WNL, cortisol WNL Procalcitonin trending down TTE showed no obvious vegetation but increased transaortic gradients across the bioprosthetic valve (unsure if patient will be able to get TEE with recent C-spine surgery) Cardiology consulted on 03/23/2020 for possible TEE, awaiting recommendation ID on board, continue IV ampicillin, ceftriaxone PCCM consulted, signed off Monitor closely  Likely LLE septic knee with ruptured Baker's cyst with  possibly pseudogout Doppler showed ruptured Baker's cyst on LLE Left knee x-ray showed large joint effusion, septic joint cannot be excluded Orthopedics  consulted s/p LLE joint aspiration on 03/21/2020 and RLE joint aspiration on 03/22/2020 both showed positive intracellular calcium pyrophosphate crystals, WBC over 15,000, culture grew rare E faecalis from LLE, culture from RLE pending Status post steroid injection in both knees on 03/22/2020 by orthopedics Plan for I&D of bilateral knees on 03/24/2020  Hyperglycemia Likely 2/2 recent steroid injection in the knee SSI, Accu-Cheks, hypoglycemic protocol  Transaminitis ?? Likely 2/2 above sepsis/hypotension/shock liver INR WNL Acute hepatitis panel nonreactive Hold home amiodarone Daily CMP  Paroxysmal A. Fib/bioprosthetic aortic valve replacement Heart rate WNL Hold home amiodarone TTE as above Switch to IV Heparin, continue to hold home Eliquis for now due to transaminitis and the need for any procedure Telemetry  Neck and back pain History of recent cervical decompression surgery Imaging does not show anything acute as mentioned above MRI cervical spine with no evidence of infection Pain management, if pain worsens/persists we will consult Dr. Trenton Gammon patient's neurosurgeon PT/OT  Normocytic anemia/postop anemia Hemoglobin dropped to 8.5-->7.1 no obvious signs of bleeding Anemia panel iron 43, sats 29, ferritin 3,811 Type and screen done Continue oral iron supplementation Daily CBC  Hypothyroidism TSH levels WNL Continue Synthroid       Malnutrition Type:  Nutrition Problem: Severe Malnutrition Etiology: acute illness   Malnutrition Characteristics:  Signs/Symptoms: energy intake < or equal to 50% for > or equal to 5 days, percent weight loss(6% weight loss within 1 month) Percent weight loss: 6 %   Nutrition Interventions:  Interventions: Ensure Enlive (each supplement provides 350kcal and 20 grams of protein)    Estimated body mass index is 24.82 kg/m as calculated from the following:   Height as of this encounter: 5\' 10"  (1.778 m).   Weight as of this  encounter: 78.5 kg.      Code Status: Full  Family Communication: Discussed extensively with wife at bedside on 03/24/2020  Disposition Plan: Status is: Inpatient  Remains inpatient appropriate because:Inpatient level of care appropriate due to severity of illness   Dispo: The patient is from: Home              Anticipated d/c is to: Home              Anticipated d/c date is: To be determined              Patient currently is not medically stable to d/c.-Due to patient requiring further  work-up, consultants signing off     Consultants:  Nephrology  PCCM  Orthopedics  Infectious disease  Procedures:  Bilateral knee joint aspiration, steroid joint injection  Antimicrobials:  Ampicillin  Ceftriaxone  DVT prophylaxis: IV Heparin   Objective: Vitals:   03/24/20 0440 03/24/20 0941 03/24/20 1318 03/24/20 1551  BP: 120/66 119/68  138/68  Pulse: 83 80  80  Resp: 18 18  16   Temp: (!) 97.5 F (36.4 C) 98 F (36.7 C)    TempSrc: Oral Oral    SpO2: 100% 100%  100%  Weight:   82 kg 78.5 kg  Height:   5\' 10"  (1.778 m) 5\' 10"  (1.778 m)    Intake/Output Summary (Last 24 hours) at 03/24/2020 1616 Last data filed at 03/24/2020 1515 Gross per 24 hour  Intake 768.97 ml  Output 1450 ml  Net -681.03 ml   Filed Weights   03/22/20 2047 03/24/20 1318 03/24/20 1551  Weight: 82 kg 82 kg 78.5 kg    Exam:  General: NAD, deconditioned  Cardiovascular: S1, S2 present  Respiratory: CTAB  Abdomen: Soft, nontender, nondistended, bowel sounds present  Musculoskeletal: Trace bilateral pedal edema noted  Skin: Normal  Psychiatry: Normal mood  Neurology: No obvious neurologic deficits noted, although generalized weakness noted    Data Reviewed: CBC: Recent Labs  Lab 03/20/20 0331 03/21/20 0234 03/22/20 0102 03/23/20 0641 03/24/20 0410  WBC 13.1* 9.4 13.7* 17.3* 22.2*  NEUTROABS 10.5* 6.9 10.6* 16.4* 19.7*  HGB 8.5* 8.2* 8.1* 7.4* 7.1*  HCT 24.8* 23.5* 22.8*  22.0* 21.2*  MCV 87.0 87.0 86.0 88.4 89.1  PLT 304 305 316 303 [id number]   Basic Metabolic Panel: Recent Labs  Lab 03/20/20 1517 03/21/20 0234 03/22/20 0102 03/23/20 0641 03/24/20 0410  NA 133* 131* 133* 136 134*  K 4.1 3.9 4.1 4.6 4.8  CL 99 99 101 104 102  CO2 18* 20* 22 20* 21*  GLUCOSE 72 80 88 322* 369*  BUN 94* 78* 52* 38* 44*  CREATININE 5.15* 4.26* 2.62* 1.93* 2.01*  CALCIUM 9.0 8.8* 8.8* 9.0 9.2   GFR: Estimated Creatinine Clearance: 36.8 mL/min (A) (by C-G formula based on SCr of 2.01 mg/dL (H)). Liver Function Tests: Recent Labs  Lab 03/20/20 0331 03/21/20 0234 03/22/20 0102 03/23/20 0641 03/24/20 0410  AST 227* 648* 295* 74* 45*  ALT 302* 564* 458* 253* 189*  ALKPHOS 127* 153* 143* 117 103  BILITOT 4.8* 5.3* 4.7* 3.8* 2.2*  PROT 6.5 6.1* 6.3* 6.2* 6.0*  ALBUMIN 2.1* 1.9* 1.8* 1.8* 1.9*   No results for input(s): LIPASE, AMYLASE in the last 168 hours. No results for input(s): AMMONIA in the last 168 hours. Coagulation Profile: Recent Labs  Lab 03/20/20 0331  INR 1.8*   Cardiac Enzymes: Recent Labs  Lab 03/19/20 1801 03/20/20 0331  CKTOTAL 20* 54   BNP (last 3 results) No results for input(s): PROBNP in the last 8760 hours. HbA1C: No results for input(s): HGBA1C in the last 72 hours. CBG: Recent Labs  Lab 03/24/20 0806 03/24/20 1011 03/24/20 1126 03/24/20 1258 03/24/20 1517  GLUCAP 415* 299* 234* 198* 167*   Lipid Profile: No results for input(s): CHOL, HDL, LDLCALC, TRIG, CHOLHDL, LDLDIRECT in the last 72 hours. Thyroid Function Tests: No results for input(s): TSH, T4TOTAL, FREET4, T3FREE, THYROIDAB in the last 72 hours. Anemia Panel: No results for input(s): VITAMINB12, FOLATE, FERRITIN, TIBC, IRON, RETICCTPCT in the last 72 hours. Urine analysis:    Component Value Date/Time   COLORURINE AMBER (A) 03/20/2020 1001   APPEARANCEUR HAZY (A) 03/20/2020 1001   LABSPEC 1.013 03/20/2020 1001   PHURINE 5.0 03/20/2020 1001   GLUCOSEU  NEGATIVE 03/20/2020 1001   HGBUR SMALL (A) 03/20/2020 1001   BILIRUBINUR NEGATIVE 03/20/2020 1001   KETONESUR NEGATIVE 03/20/2020 1001   PROTEINUR 30 (A) 03/20/2020 1001   NITRITE NEGATIVE 03/20/2020 1001   LEUKOCYTESUR NEGATIVE 03/20/2020 1001   Sepsis Labs: @LABRCNTIP (procalcitonin:4,lacticidven:4)  ) Recent Results (from the past 240 hour(s))  Respiratory Panel by RT PCR (Flu A&B, Covid) - Nasopharyngeal Swab     Status: None   Collection Time: 03/19/20 10:22 PM   Specimen: Nasopharyngeal Swab  Result Value Ref Range Status   SARS Coronavirus 2 by RT PCR NEGATIVE NEGATIVE Final    Comment: (NOTE) SARS-CoV-2 target nucleic acids are NOT DETECTED. The SARS-CoV-2 RNA is generally detectable in upper respiratoy specimens during the acute phase of infection. The lowest concentration of SARS-CoV-2 viral copies this  assay can detect is 131 copies/mL. A negative result does not preclude SARS-Cov-2 infection and should not be used as the sole basis for treatment or other patient management decisions. A negative result may occur with  improper specimen collection/handling, submission of specimen other than nasopharyngeal swab, presence of viral mutation(s) within the areas targeted by this assay, and inadequate number of viral copies (<131 copies/mL). A negative result must be combined with clinical observations, patient history, and epidemiological information. The expected result is Negative. Fact Sheet for Patients:  PinkCheek.be Fact Sheet for Healthcare Providers:  GravelBags.it This test is not yet ap proved or cleared by the [place] FDA and  has been authorized for detection and/or diagnosis of SARS-CoV-2 by FDA under an Emergency Use Authorization (EUA). This EUA will remain  in effect (meaning this test can be used) for the duration of the COVID-19 declaration under Section 564(b)(1) of the Act, 21  U.S.C. section 360bbb-3(b)(1), unless the authorization is terminated or revoked sooner.    Influenza A by PCR NEGATIVE NEGATIVE Final   Influenza B by PCR NEGATIVE NEGATIVE Final    Comment: (NOTE) The Xpert Xpress SARS-CoV-2/FLU/RSV assay is intended as an aid in  the diagnosis of influenza from Nasopharyngeal swab specimens and  should not be used as a sole basis for treatment. Nasal washings and  aspirates are unacceptable for Xpert Xpress SARS-CoV-2/FLU/RSV  testing. Fact Sheet for Patients: PinkCheek.be Fact Sheet for Healthcare Providers: GravelBags.it This test is not yet approved or cleared by the [place] FDA and  has been authorized for detection and/or diagnosis of SARS-CoV-2 by  FDA under an Emergency Use Authorization (EUA). This EUA will remain  in effect (meaning this test can be used) for the duration of the  Covid-19 declaration under Section 564(b)(1) of the Act, 21  U.S.C. section 360bbb-3(b)(1), unless the authorization is  terminated or revoked. Performed at [facility], [place] [address] [place], [place] [address]   Urine culture     Status: None   Collection Time: 03/19/20 10:39 PM   Specimen: Urine, Random  Result Value Ref Range Status   Specimen Description URINE, RANDOM  Final   Special Requests NONE  Final   Culture   Final    NO GROWTH Performed at [facility], [place] [address], [place], [place] [address]    Report Status 03/20/2020 FINAL  Final  Culture, blood (routine x 2)     Status: Abnormal   Collection Time: 03/20/20  7:45 AM   Specimen: BLOOD RIGHT FOREARM  Result Value Ref Range Status   Specimen Description BLOOD RIGHT FOREARM  Final   Special Requests   Final    BOTTLES DRAWN AEROBIC ONLY Blood Culture results may not be optimal due to an inadequate volume of blood received in culture bottles   Culture  Setup Time   Final    AEROBIC BOTTLE ONLY GRAM POSITIVE  [facility] CRITICAL RESULT CALLED TO, READ BACK BY AND VERIFIED WITH: K AMEND [facility] 03/21/20 0059 JDW Performed at [facility], [place] [address], [place], [place] [address]    Culture ENTEROCOCCUS FAECALIS (A)  Final   Report Status 03/22/2020 FINAL  Final   Organism ID, Bacteria ENTEROCOCCUS FAECALIS  Final      Susceptibility   Enterococcus faecalis - MIC*    AMPICILLIN <=2 SENSITIVE Sensitive     VANCOMYCIN 1 SENSITIVE Sensitive     GENTAMICIN SYNERGY SENSITIVE Sensitive     * ENTEROCOCCUS  FAECALIS  Blood Culture ID Panel (Reflexed)     Status: Abnormal   Collection Time: 03/20/20  7:45 AM  Result Value Ref Range Status   Enterococcus species DETECTED (A) NOT DETECTED Final    Comment: CRITICAL RESULT CALLED TO, READ BACK BY AND VERIFIED WITH: K AMEND PHARMD 03/21/20 0059 JDW    Vancomycin resistance NOT DETECTED NOT DETECTED Final   Listeria monocytogenes NOT DETECTED NOT DETECTED Final   Staphylococcus species NOT DETECTED NOT DETECTED Final   Staphylococcus aureus (BCID) NOT DETECTED NOT DETECTED Final   Streptococcus species NOT DETECTED NOT DETECTED Final   Streptococcus agalactiae NOT DETECTED NOT DETECTED Final   Streptococcus pneumoniae NOT DETECTED NOT DETECTED Final   Streptococcus pyogenes NOT DETECTED NOT DETECTED Final   Acinetobacter baumannii NOT DETECTED NOT DETECTED Final   Enterobacteriaceae species NOT DETECTED NOT DETECTED Final   Enterobacter cloacae complex NOT DETECTED NOT DETECTED Final   Escherichia coli NOT DETECTED NOT DETECTED Final   Klebsiella oxytoca NOT DETECTED NOT DETECTED Final   Klebsiella pneumoniae NOT DETECTED NOT DETECTED Final   Proteus species NOT DETECTED NOT DETECTED Final   Serratia marcescens NOT DETECTED NOT DETECTED Final   Haemophilus influenzae NOT DETECTED NOT DETECTED Final   Neisseria meningitidis NOT DETECTED NOT DETECTED Final   Pseudomonas aeruginosa NOT DETECTED NOT DETECTED Final   Candida albicans NOT DETECTED NOT  DETECTED Final   Candida glabrata NOT DETECTED NOT DETECTED Final   Candida krusei NOT DETECTED NOT DETECTED Final   Candida parapsilosis NOT DETECTED NOT DETECTED Final   Candida tropicalis NOT DETECTED NOT DETECTED Final    Comment: Performed at [facility], [address], [place], [place] [address]  Culture, blood (routine x 2)     Status: Abnormal   Collection Time: 03/20/20  8:55 AM   Specimen: BLOOD  Result Value Ref Range Status   Specimen Description BLOOD LEFT ANTECUBITAL  Final   Special Requests   Final    BOTTLES DRAWN AEROBIC AND ANAEROBIC Blood Culture results may not be optimal due to an inadequate volume of blood received in culture bottles   Culture  Setup Time   Final    IN BOTH AEROBIC AND ANAEROBIC BOTTLES GRAM POSITIVE [facility] CRITICAL VALUE NOTED.  VALUE IS CONSISTENT WITH PREVIOUSLY REPORTED AND CALLED VALUE.    Culture (A)  Final    ENTEROCOCCUS FAECALIS SUSCEPTIBILITIES PERFORMED ON PREVIOUS CULTURE WITHIN THE LAST 5 DAYS. Performed at [facility], [place] [address], [place], [place] [address]    Report Status 03/22/2020 FINAL  Final  Body fluid culture     Status: None   Collection Time: 03/21/20  4:42 PM   Specimen: Body Fluid  Result Value Ref Range Status   Specimen Description FLUID SYNOVIAL LEFT KNEE  Final   Special Requests SYRINGE  Final   Gram Stain   Final    FEW WBC PRESENT,BOTH PMN AND MONONUCLEAR NO ORGANISMS SEEN    Culture   Final    RARE ENTEROCOCCUS FAECALIS CRITICAL RESULT CALLED TO, READ BACK BY AND VERIFIED WITH: RN A MITCHELL [id number] AT 45 AM BY CM Performed at [facility], [place] [address] [place], [place] [address]    Report Status 03/24/2020 FINAL  Final   Organism ID, Bacteria ENTEROCOCCUS FAECALIS  Final      Susceptibility   Enterococcus faecalis - MIC*    AMPICILLIN <=2 SENSITIVE Sensitive     VANCOMYCIN 1 SENSITIVE Sensitive  GENTAMICIN SYNERGY SENSITIVE Sensitive     * RARE ENTEROCOCCUS FAECALIS   Gram stain     Status: None   Collection Time: 03/22/20  8:36 PM   Specimen: Synovium; Body Fluid  Result Value Ref Range Status   Specimen Description SYNOVIAL RIGHT KNEE  Final   Special Requests NONE  Final   Gram Stain   Final    ABUNDANT WBC PRESENT, PREDOMINANTLY PMN NO ORGANISMS SEEN Performed at [facility], [address], [place], [place] [address]    Report Status 03/22/2020 FINAL  Final  Culture, body fluid-bottle     Status: None (Preliminary result)   Collection Time: 03/22/20  8:42 PM   Specimen: Synovium  Result Value Ref Range Status   Specimen Description SYNOVIAL RIGHT KNEE  Final   Special Requests   Final    BOTTLES DRAWN AEROBIC ONLY Blood Culture adequate volume   Culture   Final    NO GROWTH 2 DAYS Performed at [facility], [place] [address], [place], [place] [address]    Report Status PENDING  Incomplete  Culture, blood (routine x 2)     Status: None (Preliminary result)   Collection Time: 03/23/20  5:12 PM   Specimen: BLOOD  Result Value Ref Range Status   Specimen Description BLOOD LEFT ANTECUBITAL  Final   Special Requests   Final    BOTTLES DRAWN AEROBIC ONLY Blood Culture results may not be optimal due to an inadequate volume of blood received in culture bottles   Culture   Final    NO GROWTH < 24 HOURS Performed at [facility], [place] [address], [place], [place] [address]    Report Status PENDING  Incomplete  Culture, blood (routine x 2)     Status: None (Preliminary result)   Collection Time: 03/23/20  5:12 PM   Specimen: BLOOD RIGHT HAND  Result Value Ref Range Status   Specimen Description BLOOD RIGHT HAND  Final   Special Requests   Final    BOTTLES DRAWN AEROBIC ONLY Blood Culture adequate volume   Culture   Final    NO GROWTH < 24 HOURS Performed at [facility], [place] [address] [place], [place] [address]    Report Status PENDING  Incomplete  Surgical pcr screen     Status: None   Collection Time: 03/24/20   2:21 AM   Specimen: Nasal Mucosa; Nasal Swab  Result Value Ref Range Status   MRSA, PCR NEGATIVE NEGATIVE Final   Staphylococcus aureus NEGATIVE NEGATIVE Final    Comment: (NOTE) The Xpert SA Assay (FDA approved for NASAL specimens in patients 73 years of age and older), is one component of a comprehensive surveillance program. It is not intended to diagnose infection nor to guide or monitor treatment. Performed at [facility], [place] [address], [place], [place] [address]       Studies: No results found.  Scheduled Meds:  sodium chloride   Intravenous Once   [MAR Hold] bupivacaine  10 mL Infiltration Once   [MAR Hold] feeding supplement (ENSURE ENLIVE)  237 mL Oral TID BM   [MAR Hold] insulin aspart  0-15 Units Subcutaneous TID WC   [MAR Hold] insulin aspart  0-5 Units Subcutaneous QHS   [MAR Hold] insulin aspart  3 Units Subcutaneous TID WC   [MAR Hold] insulin glargine  10 Units Subcutaneous BID   [MAR Hold] iron polysaccharides  150 mg Oral Q breakfast   [MAR  Hold] levothyroxine  25 mcg Oral Q0600   [MAR Hold] methylPREDNISolone acetate  40 mg Intra-articular Once   [MAR Hold] methylPREDNISolone acetate  40 mg Intra-articular Once   [MAR Hold] midodrine  10 mg Oral TID WC   povidone-iodine  2 application Topical Once   [MAR Hold] sodium chloride flush  3 mL Intravenous Once    Continuous Infusions:  sodium chloride 10 mL/hr at 03/24/20 1329   [MAR Hold] ampicillin (OMNIPEN) IV 2 g (03/24/20 0940)   [MAR Hold] cefTRIAXone (ROCEPHIN)  IV 2 g (03/24/20 0209)     LOS: 4 days     Alma Friendly, MD [facility]  If 7PM-7AM, please contact night-coverage [email or web address] 03/24/2020, 4:16 PM

## 2020-03-25 LAB — GLUCOSE, CAPILLARY
Glucose-Capillary: 323 mg/dL — ABNORMAL HIGH (ref 70–99)
Glucose-Capillary: 191 mg/dL — ABNORMAL HIGH (ref 70–99)
Glucose-Capillary: 240 mg/dL — ABNORMAL HIGH (ref 70–99)
Glucose-Capillary: 173 mg/dL — ABNORMAL HIGH (ref 70–99)

## 2020-03-25 LAB — CBC WITH DIFFERENTIAL/PLATELET
Abs Immature Granulocytes: 1.36 10*3/uL — ABNORMAL HIGH (ref 0.00–0.07)
Basophils Absolute: 0 10*3/uL (ref 0.0–0.1)
Basophils Relative: 0 %
Eosinophils Absolute: 0 10*3/uL (ref 0.0–0.5)
Eosinophils Relative: 0 %
HCT: 21.4 % — ABNORMAL LOW (ref 39.0–52.0)
Hemoglobin: 7.1 g/dL — ABNORMAL LOW (ref 13.0–17.0)
Immature Granulocytes: 6 %
Lymphocytes Relative: 7 %
Lymphs Abs: 1.6 10*3/uL (ref 0.7–4.0)
MCH: 30.3 pg (ref 26.0–34.0)
MCHC: 33.2 g/dL (ref 30.0–36.0)
MCV: 91.5 fL (ref 80.0–100.0)
Monocytes Absolute: 1.2 10*3/uL — ABNORMAL HIGH (ref 0.1–1.0)
Monocytes Relative: 5 %
Neutro Abs: 19 10*3/uL — ABNORMAL HIGH (ref 1.7–7.7)
Neutrophils Relative %: 82 %
Platelets: 420 10*3/uL — ABNORMAL HIGH (ref 150–400)
RBC: 2.34 MIL/uL — ABNORMAL LOW (ref 4.22–5.81)
RDW: 15.7 % — ABNORMAL HIGH (ref 11.5–15.5)
WBC: 23.1 10*3/uL — ABNORMAL HIGH (ref 4.0–10.5)
nRBC: 0 % (ref 0.0–0.2)

## 2020-03-25 LAB — COMPREHENSIVE METABOLIC PANEL
ALT: 167 U/L — ABNORMAL HIGH (ref 0–44)
AST: 42 U/L — ABNORMAL HIGH (ref 15–41)
Albumin: 2.1 g/dL — ABNORMAL LOW (ref 3.5–5.0)
Alkaline Phosphatase: 102 U/L (ref 38–126)
Anion gap: 14 (ref 5–15)
BUN: 39 mg/dL — ABNORMAL HIGH (ref 8–23)
CO2: 21 mmol/L — ABNORMAL LOW (ref 22–32)
Calcium: 9.1 mg/dL (ref 8.9–10.3)
Chloride: 99 mmol/L (ref 98–111)
Creatinine, Ser: 1.91 mg/dL — ABNORMAL HIGH (ref 0.61–1.24)
GFR calc Af Amer: 41 mL/min — ABNORMAL LOW (ref >60)
GFR calc non Af Amer: 35 mL/min — ABNORMAL LOW (ref >60)
Glucose, Bld: 307 mg/dL — ABNORMAL HIGH (ref 70–99)
Potassium: 4.3 mmol/L (ref 3.5–5.1)
Sodium: 134 mmol/L — ABNORMAL LOW (ref 135–145)
Total Bilirubin: 2 mg/dL — ABNORMAL HIGH (ref 0.3–1.2)
Total Protein: 6.3 g/dL — ABNORMAL LOW (ref 6.5–8.1)

## 2020-03-25 LAB — MPO/PR-3 (ANCA) ANTIBODIES
ANCA Proteinase 3: <3.5 U/mL (ref 0.0–3.5)
Myeloperoxidase Abs: <9 U/mL (ref 0.0–9.0)

## 2020-03-25 LAB — HEPARIN LEVEL (UNFRACTIONATED): Heparin Unfractionated: 0.95 IU/mL — ABNORMAL HIGH (ref 0.30–0.70)

## 2020-03-25 LAB — APTT: aPTT: 55 seconds — ABNORMAL HIGH (ref 24–36)

## 2020-03-25 NOTE — Progress Notes (Signed)
    [facility] has been requested to perform a transesophageal echocardiogram on Johnathan Murray for bacteremia.  After careful review of history and examination, the risks and benefits of transesophageal echocardiogram have been explained including risks of esophageal damage, perforation (1:10,000 risk), bleeding, pharyngeal hematoma as well as other potential complications associated with conscious sedation including aspiration, arrhythmia, respiratory failure and death. Alternatives to treatment were discussed, questions were answered. Patient is willing to proceed.   Harl Wiechmann Ninfa Meeker, PA-C  03/25/2020 5:38 PM

## 2020-03-25 NOTE — Evaluation (Signed)
Occupational Therapy Evaluation Patient Details Name: Johnathan Murray MRN: [id number] DOB: 09/04/1951 Today's Date: 03/25/2020    History of Present Illness Pt is a 69 y/o male s/p ACDF C3-4 on 4/12, admitted to [facility] on 4/30 for progressive weakness, fatigue, bilateral knee pain L>R. Pt with AKI, bacteremia, L septic knee with plan for I&D bilateral knees on 5/4 pm . PMHx includes previous ACDF C4-6 (04/2019), lumbar sx (05/2019), CAD, DM, HTN, hx of CABG   Clinical Impression   Pt with decline in function and safety with ADLs and ADL mobility with impaired strength, balance and endurance. Pt's wife present and very concerned about amount of assist that pt currently requires and would require at home if to d/c home with [facility] therapy. PTA, pt live at home with his wife and required some assist with LB ADLs due to recent onset of pain and B UE and LE weakness. Pt currently requires mod A to sit EOB, max A sit -stand from EOB to RW, very unsteady during side stepping, max - total A with LB ADLs and toileting. Pt would benefit from acute OT services to address impairments to maximize level of function and safety to return home    Follow Up Recommendations  CIR    Equipment Recommendations  3 in 1 bedside commode;Other (comment)(reacher)    Recommendations for Other Services Rehab consult     Precautions / Restrictions Precautions Precautions: Fall;Cervical Precaution Comments: ACDF on 4/12, pt reports they d/c-ed collar 1 week after surgery Restrictions Weight Bearing Restrictions: No      Mobility Bed Mobility Overal bed mobility: Needs Assistance Bed Mobility: Supine to Sit   Sidelying to sit: Mod assist Supine to sit: Mod assist     General bed mobility comments: mod A for LEs to EOB, cues for log roll technqiue  Transfers Overall transfer level: Needs assistance Equipment used: Rolling walker (2 wheeled) Transfers: Sit to/from Stand Sit to Stand: Max assist          General transfer comment: max - mod A to power up from EOB, 2 attempts before pt able to stand and leaning way far forward, cues for safety and body mechanics. pt able to side step towards foot of bed and back towards HOB, very unsteady    Balance Overall balance assessment: Needs assistance Sitting-balance support: Feet supported;No upper extremity supported Sitting balance-Leahy Scale: Fair     Standing balance support: During functional activity;Bilateral upper extremity supported Standing balance-Leahy Scale: Poor                             ADL either performed or assessed with clinical judgement   ADL Overall ADL's : Needs assistance/impaired     Grooming: Wash/dry hands;Wash/dry face;Set up;Supervision/safety;Sitting;With caregiver independent assisting   Upper Body Bathing: Set up;Supervision/ safety;Sitting;With caregiver independent assisting   Lower Body Bathing: Maximal assistance   Upper Body Dressing : Total assistance   Lower Body Dressing: Total assistance                       Vision Baseline Vision/History: Wears glasses Wears Glasses: Reading only Patient Visual Report: No change from baseline       Perception     Praxis      Pertinent Vitals/Pain Pain Assessment: 0-10 Pain Score: 6  Pain Location: neck and back, B knees Pain Descriptors / Indicators: Sore;Discomfort Pain Intervention(s): Limited activity within patient's tolerance;Monitored during session;Repositioned;Premedicated  before session     Hand Dominance Right   Extremity/Trunk Assessment Upper Extremity Assessment Upper Extremity Assessment: Generalized weakness   Lower Extremity Assessment Lower Extremity Assessment: Defer to PT evaluation   Cervical / Trunk Assessment Cervical / Trunk Assessment: Other exceptions Cervical / Trunk Exceptions: s/p ACDF on 4/12   Communication Communication Communication: No difficulties   Cognition Arousal/Alertness:  Awake/alert Behavior During Therapy: WFL for tasks assessed/performed Overall Cognitive Status: Within Functional Limits for tasks assessed Area of Impairment: Safety/judgement;Problem solving                                   General Comments       Exercises     Shoulder Instructions      Home Living Family/patient expects to be discharged to:: Private residence Living Arrangements: Spouse/significant other Available Help at Discharge: Family;Available 24 hours/day Type of Home: House Home Access: Stairs to enter [employer] of Steps: 7 Entrance Stairs-Rails: Can reach both Home Layout: One level     Bathroom Shower/Tub: Occupational psychologist: Standard     Home Equipment: Grab bars - tub/shower;Shower seat - built in;Cane - single point;Walker - 2 wheels          Prior Functioning/Environment Level of Independence: Needs assistance  Gait / Transfers Assistance Needed: pt reports using RW for very short distance ambulation PTA, states he had very little tolerence with periodic L knee buckling. for the first week after surgery, pt reports he was up with cane and felt he really didn't even need taht. ADL's / Homemaking Assistance Needed: Pt's wife assisted pt with getting up in the morning, then wife would go to work so pt was home alone during the day.   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        OT Problem List: Decreased strength;Decreased range of motion;Decreased activity tolerance;Impaired balance (sitting and/or standing);Decreased coordination;Decreased cognition;Decreased safety awareness;Decreased knowledge of use of DME or AE;Decreased knowledge of precautions;Impaired UE functional use      OT Treatment/Interventions: Self-care/ADL training;Therapeutic exercise;Energy conservation;DME and/or AE instruction;Therapeutic activities;Patient/family education;Balance training;Neuromuscular education    OT  Goals(Current goals can be found in the care plan section) Acute Rehab OT Goals Patient Stated Goal: go home, feel stronger OT Goal Formulation: With patient/family Time For Goal Achievement: 04/08/20 Potential to Achieve Goals: Good ADL Goals Pt Will Perform Grooming: with min assist;with min guard assist;standing;with caregiver independent in assisting Pt Will Perform Lower Body Bathing: with mod assist;sitting/lateral leans;sit to/from stand;with adaptive equipment;with caregiver independent in assisting Pt Will Perform Lower Body Dressing: with max assist;with mod assist;sitting/lateral leans;sit to/from stand;with adaptive equipment;with caregiver independent in assisting Pt Will Transfer to Toilet: with min assist;stand pivot transfer;bedside commode Pt Will Perform Toileting - Clothing Manipulation and hygiene: with max assist;with mod assist;sit to/from stand;with caregiver independent in assisting Additional ADL Goal #1: Pt will sit EOB using proper body mechanocs in prep for ADLs/selfcare  OT Frequency: Min 2X/week   Barriers to D/C:            Co-evaluation              AM-PAC OT "6 Clicks" Daily Activity     Outcome Measure Help from another person eating meals?: None Help from another person taking care of personal grooming?: A Little Help from another person toileting, which includes using toliet, bedpan, or urinal?: A Lot Help  from another person bathing (including washing, rinsing, drying)?: A Lot Help from another person to put on and taking off regular upper body clothing?: A Little Help from another person to put on and taking off regular lower body clothing?: Total 6 Click Score: 15   End of Session Equipment Utilized During Treatment: Rolling walker;Gait belt  Activity Tolerance: Patient limited by fatigue Patient left: in bed;with family/visitor present;Other (comment)(sitting EOB with wife present, RN aware)  OT Visit Diagnosis: Unsteadiness on feet  (R26.81);Muscle weakness (generalized) (M62.81);Other abnormalities of gait and mobility (R26.89);Pain Pain - Right/Left: (bilaterally) Pain - part of body: Knee                Time: [id number] OT Time Calculation (min): 26 min Charges:  OT General Charges $OT Visit: 1 Visit OT Evaluation $OT Eval Moderate Complexity: 1 Mod OT Treatments $Therapeutic Activity: 8-22 mins    Britt Bottom 03/25/2020, 12:46 PM

## 2020-03-25 NOTE — Progress Notes (Signed)
Patient ID: Johnathan Murray, male   DOB: [birth year], 69 y.o.   MRN: [id number]         [facility]  Date of Admission:  03/19/2020   Total days of antibiotics 6         ASSESSMENT: He has enterococcal bacteremia and septic arthritis of his left knee.  He has a history of aortic valve replacement and is at relatively high risk of prosthetic valve endocarditis.  He recently underwent cervical spine fusion.  If feasible it would be best to have a TEE performed to help determine optimal type and duration of antibiotic therapy.  Repeat blood cultures remain negative at 48 hours.  He can have a PICC placed at any time.  PLAN: 1. Continue ampicillin and ceftriaxone 2. Await decision about TEE  Principal Problem:   Enterococcal bacteremia Active Problems:   S/P aortic valve replacement   Septic arthritis (HCC)   History of fusion of cervical spine   Essential hypertension   PAF (paroxysmal atrial fibrillation) (HCC)   Abnormal LFTs (liver function tests)   Cervical myelopathy (HCC)   ARF (acute renal failure) (HCC)   Pseudogout of left knee   Protein-calorie malnutrition, severe   Scheduled Meds: . docusate sodium  100 mg Oral BID  . feeding supplement (ENSURE ENLIVE)  237 mL Oral TID BM  . insulin aspart  0-15 Units Subcutaneous TID WC  . insulin aspart  0-5 Units Subcutaneous QHS  . insulin aspart  3 Units Subcutaneous TID WC  . insulin glargine  10 Units Subcutaneous BID  . iron polysaccharides  150 mg Oral Q breakfast  . levothyroxine  25 mcg Oral Q0600  . midodrine  10 mg Oral TID WC  . senna  1 tablet Oral BID   Continuous Infusions: . ampicillin (OMNIPEN) IV 2 g (03/25/20 0935)  . cefTRIAXone (ROCEPHIN)  IV 2 g (03/25/20 0518)  . heparin 1,400 Units/hr (03/25/20 0622)   PRN Meds:.bisacodyl, diphenhydrAMINE, hydrALAZINE, magnesium citrate, metoCLOPramide **OR** metoCLOPramide (REGLAN) injection, metoprolol tartrate, morphine injection,  ondansetron **OR** ondansetron (ZOFRAN) IV, oxyCODONE, polyethylene glycol   SUBJECTIVE: He has acute on chronic bilateral knee pain but overall is feeling better.  He underwent bilateral arthroscopic knee surgery yesterday.  Review of Systems: Review of Systems  Constitutional: Negative for chills, diaphoresis and fever.  Respiratory: Negative for cough and shortness of breath.   Cardiovascular: Negative for chest pain.  Gastrointestinal: Negative for abdominal pain, diarrhea, nausea and vomiting.  Genitourinary: Negative for dysuria.  Musculoskeletal: Positive for joint pain.    Allergies  Allergen Reactions  . Atorvastatin Other (See Comments)    Urinary retention   . Nebivolol Diarrhea    OBJECTIVE: Vitals:   03/24/20 1926 03/24/20 2024 03/25/20 0607 03/25/20 0937  BP: 140/90 132/61 116/79 127/72  Pulse: 77 68 96 90  Resp: 17 18 18 18   Temp: 97.6 F (36.4 C) 97.8 F (36.6 C) 98.3 F (36.8 C) 98.4 F (36.9 C)  TempSrc:   Oral Oral  SpO2: 100% 100% 98% 100%  Weight:      Height:       Body mass index is 24.82 kg/m.  Physical Exam Constitutional:      Comments: He is sitting on the side of the bed.  He appears very weak but otherwise in no distress.  Cardiovascular:     Rate and Rhythm: Normal rate and regular rhythm.     Heart sounds: No murmur.  Pulmonary:  Effort: Pulmonary effort is normal.     Breath sounds: Normal breath sounds.  Musculoskeletal:     Comments: He has Ace wraps on both knees.  Skin:    Findings: No rash.  Psychiatric:        Mood and Affect: Mood normal.     Lab Results Lab Results  Component Value Date   WBC 23.1 (H) 03/25/2020   HGB 7.1 (L) 03/25/2020   HCT 21.4 (L) 03/25/2020   MCV 91.5 03/25/2020   PLT 420 (H) 03/25/2020    Lab Results  Component Value Date   CREATININE 1.91 (H) 03/25/2020   BUN 39 (H) 03/25/2020   NA 134 (L) 03/25/2020   K 4.3 03/25/2020   CL 99 03/25/2020   CO2 21 (L) 03/25/2020    Lab  Results  Component Value Date   ALT 167 (H) 03/25/2020   AST 42 (H) 03/25/2020   ALKPHOS 102 03/25/2020   BILITOT 2.0 (H) 03/25/2020     Microbiology: Recent Results (from the past 240 hour(s))  Respiratory Panel by RT PCR (Flu A&B, Covid) - Nasopharyngeal Swab     Status: None   Collection Time: 03/19/20 10:22 PM   Specimen: Nasopharyngeal Swab  Result Value Ref Range Status   SARS Coronavirus 2 by RT PCR NEGATIVE NEGATIVE Final    Comment: (NOTE) SARS-CoV-2 target nucleic acids are NOT DETECTED. The SARS-CoV-2 RNA is generally detectable in upper respiratoy specimens during the acute phase of infection. The lowest concentration of SARS-CoV-2 viral copies this assay can detect is 131 copies/mL. A negative result does not preclude SARS-Cov-2 infection and should not be used as the sole basis for treatment or other patient management decisions. A negative result may occur with  improper specimen collection/handling, submission of specimen other than nasopharyngeal swab, presence of viral mutation(s) within the areas targeted by this assay, and inadequate number of viral copies (<131 copies/mL). A negative result must be combined with clinical observations, patient history, and epidemiological information. The expected result is Negative. Fact Sheet for Patients:  PinkCheek.be Fact Sheet for Healthcare Providers:  GravelBags.it This test is not yet ap proved or cleared by the [place] FDA and  has been authorized for detection and/or diagnosis of SARS-CoV-2 by FDA under an Emergency Use Authorization (EUA). This EUA will remain  in effect (meaning this test can be used) for the duration of the COVID-19 declaration under Section 564(b)(1) of the Act, 21 U.S.C. section 360bbb-3(b)(1), unless the authorization is terminated or revoked sooner.    Influenza A by PCR NEGATIVE NEGATIVE Final   Influenza B by PCR NEGATIVE  NEGATIVE Final    Comment: (NOTE) The Xpert Xpress SARS-CoV-2/FLU/RSV assay is intended as an aid in  the diagnosis of influenza from Nasopharyngeal swab specimens and  should not be used as a sole basis for treatment. Nasal washings and  aspirates are unacceptable for Xpert Xpress SARS-CoV-2/FLU/RSV  testing. Fact Sheet for Patients: PinkCheek.be Fact Sheet for Healthcare Providers: GravelBags.it This test is not yet approved or cleared by the [place] FDA and  has been authorized for detection and/or diagnosis of SARS-CoV-2 by  FDA under an Emergency Use Authorization (EUA). This EUA will remain  in effect (meaning this test can be used) for the duration of the  Covid-19 declaration under Section 564(b)(1) of the Act, 21  U.S.C. section 360bbb-3(b)(1), unless the authorization is  terminated or revoked. Performed at [facility], [place] [address] [place], [place] [address]  Urine culture     Status: None   Collection Time: 03/19/20 10:39 PM   Specimen: Urine, Random  Result Value Ref Range Status   Specimen Description URINE, RANDOM  Final   Special Requests NONE  Final   Culture   Final    NO GROWTH Performed at [facility], [address] [place], [place] [address]    Report Status 03/20/2020 FINAL  Final  Culture, blood (routine x 2)     Status: Abnormal   Collection Time: 03/20/20  7:45 AM   Specimen: BLOOD RIGHT FOREARM  Result Value Ref Range Status   Specimen Description BLOOD RIGHT FOREARM  Final   Special Requests   Final    BOTTLES DRAWN AEROBIC ONLY Blood Culture results may not be optimal due to an inadequate volume of blood received in culture bottles   Culture  Setup Time   Final    AEROBIC BOTTLE ONLY GRAM POSITIVE COCCI CRITICAL RESULT CALLED TO, READ BACK BY AND VERIFIED WITH: K AMEND [facility] 03/21/20 0059 JDW Performed at [facility], [place] [address] [place], [place]  [address]    Culture ENTEROCOCCUS FAECALIS (A)  Final   Report Status 03/22/2020 FINAL  Final   Organism ID, Bacteria ENTEROCOCCUS FAECALIS  Final      Susceptibility   Enterococcus faecalis - MIC*    AMPICILLIN <=2 SENSITIVE Sensitive     VANCOMYCIN 1 SENSITIVE Sensitive     GENTAMICIN SYNERGY SENSITIVE Sensitive     * ENTEROCOCCUS FAECALIS  Blood Culture ID Panel (Reflexed)     Status: Abnormal   Collection Time: 03/20/20  7:45 AM  Result Value Ref Range Status   Enterococcus species DETECTED (A) NOT DETECTED Final    Comment: CRITICAL RESULT CALLED TO, READ BACK BY AND VERIFIED WITH: K AMEND PHARMD 03/21/20 0059 JDW    Vancomycin resistance NOT DETECTED NOT DETECTED Final   Listeria monocytogenes NOT DETECTED NOT DETECTED Final   Staphylococcus species NOT DETECTED NOT DETECTED Final   Staphylococcus aureus (BCID) NOT DETECTED NOT DETECTED Final   Streptococcus species NOT DETECTED NOT DETECTED Final   Streptococcus agalactiae NOT DETECTED NOT DETECTED Final   Streptococcus pneumoniae NOT DETECTED NOT DETECTED Final   Streptococcus pyogenes NOT DETECTED NOT DETECTED Final   Acinetobacter baumannii NOT DETECTED NOT DETECTED Final   Enterobacteriaceae species NOT DETECTED NOT DETECTED Final   Enterobacter cloacae complex NOT DETECTED NOT DETECTED Final   Escherichia coli NOT DETECTED NOT DETECTED Final   Klebsiella oxytoca NOT DETECTED NOT DETECTED Final   Klebsiella pneumoniae NOT DETECTED NOT DETECTED Final   Proteus species NOT DETECTED NOT DETECTED Final   Serratia marcescens NOT DETECTED NOT DETECTED Final   Haemophilus influenzae NOT DETECTED NOT DETECTED Final   Neisseria meningitidis NOT DETECTED NOT DETECTED Final   Pseudomonas aeruginosa NOT DETECTED NOT DETECTED Final   Candida albicans NOT DETECTED NOT DETECTED Final   Candida glabrata NOT DETECTED NOT DETECTED Final   Candida krusei NOT DETECTED NOT DETECTED Final   Candida parapsilosis NOT DETECTED NOT DETECTED Final    Candida tropicalis NOT DETECTED NOT DETECTED Final    Comment: Performed at [facility], [place] [address], [place], [place] [address]  Culture, blood (routine x 2)     Status: Abnormal   Collection Time: 03/20/20  8:55 AM   Specimen: BLOOD  Result Value Ref Range Status   Specimen Description BLOOD LEFT ANTECUBITAL  Final   Special Requests  Final    BOTTLES DRAWN AEROBIC AND ANAEROBIC Blood Culture results may not be optimal due to an inadequate volume of blood received in culture bottles   Culture  Setup Time   Final    IN BOTH AEROBIC AND ANAEROBIC BOTTLES GRAM POSITIVE COCCI CRITICAL VALUE NOTED.  VALUE IS CONSISTENT WITH PREVIOUSLY REPORTED AND CALLED VALUE.    Culture (A)  Final    ENTEROCOCCUS FAECALIS SUSCEPTIBILITIES PERFORMED ON PREVIOUS CULTURE WITHIN THE LAST 5 DAYS. Performed at [facility], [place] [address], [place], [place] [address]    Report Status 03/22/2020 FINAL  Final  Body fluid culture     Status: None   Collection Time: 03/21/20  4:42 PM   Specimen: Body Fluid  Result Value Ref Range Status   Specimen Description FLUID SYNOVIAL LEFT KNEE  Final   Special Requests SYRINGE  Final   Gram Stain   Final    FEW WBC PRESENT,BOTH PMN AND MONONUCLEAR NO ORGANISMS SEEN    Culture   Final    RARE ENTEROCOCCUS FAECALIS CRITICAL RESULT CALLED TO, READ BACK BY AND VERIFIED WITH: RN A MITCHELL [id number] AT 79 AM BY CM Performed at [facility], [place] [address], [place], [place] [address]    Report Status 03/24/2020 FINAL  Final   Organism ID, Bacteria ENTEROCOCCUS FAECALIS  Final      Susceptibility   Enterococcus faecalis - MIC*    AMPICILLIN <=2 SENSITIVE Sensitive     VANCOMYCIN 1 SENSITIVE Sensitive     GENTAMICIN SYNERGY SENSITIVE Sensitive     * RARE ENTEROCOCCUS FAECALIS  Gram stain     Status: None   Collection Time: 03/22/20  8:36 PM   Specimen: Synovium; Body Fluid  Result Value Ref Range Status   Specimen Description SYNOVIAL RIGHT  KNEE  Final   Special Requests NONE  Final   Gram Stain   Final    ABUNDANT WBC PRESENT, PREDOMINANTLY PMN NO ORGANISMS SEEN Performed at [facility], [address], [place], [place] [address]    Report Status 03/22/2020 FINAL  Final  Culture, body fluid-bottle     Status: None (Preliminary result)   Collection Time: 03/22/20  8:42 PM   Specimen: Synovium  Result Value Ref Range Status   Specimen Description SYNOVIAL RIGHT KNEE  Final   Special Requests   Final    BOTTLES DRAWN AEROBIC ONLY Blood Culture adequate volume   Culture   Final    NO GROWTH 3 DAYS Performed at [facility], [address] [place], [place] [address]    Report Status PENDING  Incomplete  Culture, blood (routine x 2)     Status: None (Preliminary result)   Collection Time: 03/23/20  5:12 PM   Specimen: BLOOD  Result Value Ref Range Status   Specimen Description BLOOD LEFT ANTECUBITAL  Final   Special Requests   Final    BOTTLES DRAWN AEROBIC ONLY Blood Culture results may not be optimal due to an inadequate volume of blood received in culture bottles   Culture   Final    NO GROWTH 2 DAYS Performed at [facility], [place] [address], [place], [place] [address]    Report Status PENDING  Incomplete  Culture, blood (routine x 2)     Status: None (Preliminary result)   Collection Time: 03/23/20  5:12 PM   Specimen: BLOOD RIGHT HAND  Result Value Ref Range Status   Specimen Description BLOOD RIGHT  HAND  Final   Special Requests   Final    BOTTLES DRAWN AEROBIC ONLY Blood Culture adequate volume   Culture   Final    NO GROWTH 2 DAYS Performed at [facility], [address] [place], [place] [address]    Report Status PENDING  Incomplete  Surgical pcr screen     Status: None   Collection Time: 03/24/20  2:21 AM   Specimen: Nasal Mucosa; Nasal Swab  Result Value Ref Range Status   MRSA, PCR NEGATIVE NEGATIVE Final   Staphylococcus aureus NEGATIVE NEGATIVE Final    Comment:  (NOTE) The Xpert SA Assay (FDA approved for NASAL specimens in patients 64 years of age and older), is one component of a comprehensive surveillance program. It is not intended to diagnose infection nor to guide or monitor treatment. Performed at [facility], [place] [address], [place], [place] [address]     Michel Bickers, [place] [employer] [phone] pager   [phone] cell 03/25/2020, 11:31 AM

## 2020-03-25 NOTE — H&P (View-Only) (Signed)
    [facility] has been requested to perform a transesophageal echocardiogram on [name] for bacteremia.  After careful review of history and examination, the risks and benefits of transesophageal echocardiogram have been explained including risks of esophageal damage, perforation (1:10,000 risk), bleeding, pharyngeal hematoma as well as other potential complications associated with conscious sedation including aspiration, arrhythmia, respiratory failure and death. Alternatives to treatment were discussed, questions were answered. Patient is willing to proceed.   Fount Bahe Ninfa Meeker, PA-C  03/25/2020 5:38 PM

## 2020-03-25 NOTE — Progress Notes (Signed)
ANTICOAGULATION CONSULT NOTE Follow Up Consult  Pharmacy Consult for Heparin Indication: atrial fibrillation  Assessment: 57 YOM admitted for further workup of AKI and elevated LFTs. Pt has hx of a fib, for which he takes Eliquis at home; pharmacy was consulted to transition pt from Eliquis to IV heparin; last dose of Eliquis was taken on 4/29).  PTT 55 seconds s/p heparin resumption post knee washout  Goal of Therapy:  Heparin level: 0.3-0.5 units/ml  aPTT 66-85 sec Monitor platelets by anticoagulation protocol: Yes   Plan:  Increase heparin to 1500 units / hr Daily heparin level and aPTT  When to resume Eliquis?  Thank you Anette Guarneri, PharmD [phone]

## 2020-03-25 NOTE — Progress Notes (Signed)
PROGRESS NOTE    Johnathan Murray  [id number] DOB: [birth year] DOA: 03/19/2020 PCP: Johnathan Dress, MD    Brief Narrative:  69 y.o.malewithhistory of paroxysmal atrial fibrillation, hypertension, bioprosthetic aortic valve replacement who had undergone recent cervical discectomy and fusion about 3 weeks ago done by Dr. Trenton Gammon has been feeling weak with increasing pain in the neck and low back and poor appetite. Denies any vomiting or diarrhea denies taking any Tylenol but does take NSAIDs sometimes. Has not noticed any blood in the urine and has been urinating less last few days. Has subjective feeling of fever chills. Given the symptoms patient presents to the ER. Patient states over the last 3 weeks and surgery patient has been finding it difficult to ambulate because of the weakness of the lower extremity. Denies any incontinence of urine or bowel. Has been having more lower extremity edema more on the left side. In the ER on exam patient appears generally weak with weakness of the lower extremity. There is edema of the lower extremity on the left side. Labs show creatinine of 6.4 which is increased from 1.6 about 3 weeks ago. In addition patient's LFTs are markedly elevated at AST of 415 and ALT of 429 patient's albumin is 2.6 total bilirubin 5.5 bicarb 19 sodium 130 calcium 10.2 hemoglobin 10.4 lactic acid 1.6 WBC 12.6 platelets 341 urinalysis shows RBCs of 21-50 WBC 21-50 protein around 100 bacteria rare hyaline cast present mucus present. Patient was given fluid bolus and admitted for acute renal failure. Patient also had CT abdomen pelvis and CT cervical spine which did not show any acute. USS of the abdomen also was unremarkable. Covid test is negative  Assessment & Plan:   Principal Problem:   Enterococcal bacteremia Active Problems:   Essential hypertension   PAF (paroxysmal atrial fibrillation) (HCC)   Abnormal LFTs (liver function tests)   Cervical myelopathy  (HCC)   S/P aortic valve replacement   ARF (acute renal failure) (HCC)   Pseudogout of left knee   Septic arthritis (HCC)   History of fusion of cervical spine   Protein-calorie malnutrition, severe   AKI on CKD stage III/anion gap metabolic acidosis Improving Multifactorial from bacteremia, poor oral intake, in addition to medication induced from NSAID, Lasix and lisinopril use Baseline creatinine 1.3-1.6, on presentation 6.40 Noted urine with RBCs and protein-further work-up Per nephrology IVF now stopped Holding patient's Lasix, lisinopril CT abdomen does not show any obstruction Nephrology consulted, signed off [name] in AM  Sepsis likely 2/2 Enterococcus faecalis bacteremia On presentation, patient was hypotensive, with leukocytosis Source from possible knee infection vs recent postoperative cervical/lumbar infection Afebrile, with leukocytosis (steroid in knee) BC x2 growing Enterococcus faecalis, repeat BC x2 NGTD UC no growth, Chest x-ray unremarkable Lactic acid, TSH WNL, cortisol WNL Procalcitonin trending down TTE showed no obvious vegetation but increased transaortic gradients across the bioprosthetic valve  Cardiology consulted on 03/23/2020 for possible TEE. Discussed with Cardiology today, awaiting decision on risk/benefit of TEE vs empirically treating with long term abx ID following continue IV ampicillin, ceftriaxone PCCM consulted earlier, since signed off  Likely LLE septic knee with ruptured Baker's cyst with possibly pseudogout Doppler showed ruptured Baker's cyst on LLE Left knee x-ray showed large joint effusion, septic joint cannot be excluded Orthopedics consulted s/p LLE joint aspiration on 03/21/2020 and RLE joint aspiration on 03/22/2020 both showed positive intracellular calcium pyrophosphate crystals, WBC over 15,000, culture grew rare E faecalis from LLE, culture from RLE without organisms Status  post steroid injection in both knees on 03/22/2020  by orthopedics Now s/p I&D of bilateral knees on 03/24/2020  Hyperglycemia Likely 2/2 recent steroid injection in the knee Continue with SSI, Accu-Cheks, hypoglycemic protocol  Transaminitis Suspect secondary to above sepsis/hypotension/shock liver INR WNL Acute hepatitis panel nonreactive Holding home amiodarone Repeat cmp in AM  Paroxysmal A. Fib/bioprosthetic aortic valve replacement Heart rate WNL Holding home amiodarone TTE as above Now on IV Heparin, continue to hold home Eliquis for now due to transaminitis and possibility of procedure Telemetry  Neck and back pain History of recent cervical decompression surgery Imaging does not show anything acute as mentioned above MRI cervical spine with no evidence of infection Pain management, if pain worsens/persists we will consult Dr. Trenton Gammon patient's neurosurgeon PT/OT consulted  Normocytic anemia/postop anemia Hemoglobin dropped to 8.5-->7.1 no obvious signs of bleeding Anemia panel iron 43, sats 29, ferritin 3,811 Type and screen done Continue oral iron supplementation Recheck CBC in AM  Hypothyroidism TSH levels WNL Continue Synthroid as tolerated  DVT prophylaxis: Heparin gtt Code Status: Full Family Communication: Pt in room, family at bedside  Status is: Inpatient  Remains inpatient appropriate because:Ongoing diagnostic testing needed not appropriate for outpatient work up   Dispo: The patient is from: Home              Anticipated d/c is to: CIR              Anticipated d/c date is: 2 days              Patient currently is not medically stable to d/c.  Consultants:   ID  Orthopedic Surgery  Critical Care  Nephrology  Procedures:     Antimicrobials: Anti-infectives (From admission, onward)   Start     Dose/Rate Route Frequency Ordered Stop   03/22/20 1515  cefTRIAXone (ROCEPHIN) 2 g in sodium chloride 0.9 % 100 mL IVPB     2 g 200 mL/hr over 30 Minutes Intravenous Every 12 hours  03/22/20 1442     03/22/20 1000  ampicillin (OMNIPEN) 2 g in sodium chloride 0.9 % 100 mL IVPB     2 g 300 mL/hr over 20 Minutes Intravenous Every 6 hours 03/22/20 0925     03/21/20 1800  ceFEPIme (MAXIPIME) 1 g in sodium chloride 0.9 % 100 mL IVPB  Status:  Discontinued     1 g 200 mL/hr over 30 Minutes Intravenous Every 24 hours 03/20/20 1143 03/21/20 0116   03/21/20 0200  ampicillin (OMNIPEN) 2 g in sodium chloride 0.9 % 100 mL IVPB  Status:  Discontinued     2 g 300 mL/hr over 20 Minutes Intravenous Every 8 hours 03/21/20 0116 03/22/20 0925   03/20/20 1230  ceFEPIme (MAXIPIME) 1 g in sodium chloride 0.9 % 100 mL IVPB     1 g 200 mL/hr over 30 Minutes Intravenous  Once 03/20/20 1143 03/20/20 1358       Subjective: Complains of residual back pains  Objective: Vitals:   03/24/20 1926 03/24/20 2024 03/25/20 0607 03/25/20 0937  BP: 140/90 132/61 116/79 127/72  Pulse: 77 68 96 90  Resp: 17 18 18 18   Temp: 97.6 F (36.4 C) 97.8 F (36.6 C) 98.3 F (36.8 C) 98.4 F (36.9 C)  TempSrc:   Oral Oral  SpO2: 100% 100% 98% 100%  Weight:      Height:        Intake/Output Summary (Last 24 hours) at 03/25/2020 1505 Last data filed at 03/25/2020  [id number] Gross per 24 hour  Intake 637.67 ml  Output 1100 ml  Net -462.33 ml   Filed Weights   03/22/20 2047 03/24/20 1318 03/24/20 1551  Weight: 82 kg 82 kg 78.5 kg    Examination:  General exam: Appears calm and comfortable  Respiratory system: Clear to auscultation. Respiratory effort normal. Cardiovascular system: S1 & S2 heard, Regular Gastrointestinal system: Abdomen is nondistended, soft and nontender. No organomegaly or masses felt. Normal bowel sounds heard. Central nervous system: Alert and oriented. No focal neurological deficits. Extremities: Symmetric 5 x 5 power. Skin: No rashes, lesions  Psychiatry: Judgement and insight appear normal. Mood & affect appropriate.   Data Reviewed: I have personally reviewed following labs and  imaging studies  CBC: Recent Labs  Lab 03/21/20 0234 03/22/20 0102 03/23/20 0641 03/24/20 0410 03/25/20 0515  WBC 9.4 13.7* 17.3* 22.2* 23.1*  NEUTROABS 6.9 10.6* 16.4* 19.7* 19.0*  HGB 8.2* 8.1* 7.4* 7.1* 7.1*  HCT 23.5* 22.8* 22.0* 21.2* 21.4*  MCV 87.0 86.0 88.4 89.1 91.5  PLT 305 316 303 329 0000000*   Basic Metabolic Panel: Recent Labs  Lab 03/21/20 0234 03/22/20 0102 03/23/20 0641 03/24/20 0410 03/25/20 0515  NA 131* 133* 136 134* 134*  K 3.9 4.1 4.6 4.8 4.3  CL 99 101 104 102 99  CO2 20* 22 20* 21* 21*  GLUCOSE 80 88 322* 369* 307*  BUN 78* 52* 38* 44* 39*  CREATININE 4.26* 2.62* 1.93* 2.01* 1.91*  CALCIUM 8.8* 8.8* 9.0 9.2 9.1   GFR: Estimated Creatinine Clearance: 38.8 mL/min (A) (by C-G formula based on SCr of 1.91 mg/dL (H)). Liver Function Tests: Recent Labs  Lab 03/21/20 0234 03/22/20 0102 03/23/20 0641 03/24/20 0410 03/25/20 0515  AST 648* 295* 74* 45* 42*  ALT 564* 458* 253* 189* 167*  ALKPHOS 153* 143* 117 103 102  BILITOT 5.3* 4.7* 3.8* 2.2* 2.0*  PROT 6.1* 6.3* 6.2* 6.0* 6.3*  ALBUMIN 1.9* 1.8* 1.8* 1.9* 2.1*   No results for input(s): LIPASE, AMYLASE in the last 168 hours. No results for input(s): AMMONIA in the last 168 hours. Coagulation Profile: Recent Labs  Lab 03/20/20 0331  INR 1.8*   Cardiac Enzymes: Recent Labs  Lab 03/19/20 1801 03/20/20 0331  CKTOTAL 20* 54   BNP (last 3 results) No results for input(s): PROBNP in the last 8760 hours. HbA1C: No results for input(s): HGBA1C in the last 72 hours. CBG: Recent Labs  Lab 03/24/20 1517 03/24/20 1843 03/24/20 2022 03/25/20 0650 03/25/20 1123  GLUCAP 167* 229* 261* 323* 191*   Lipid Profile: No results for input(s): CHOL, HDL, LDLCALC, TRIG, CHOLHDL, LDLDIRECT in the last 72 hours. Thyroid Function Tests: No results for input(s): TSH, T4TOTAL, FREET4, T3FREE, THYROIDAB in the last 72 hours. Anemia Panel: No results for input(s): VITAMINB12, FOLATE, FERRITIN, TIBC,  IRON, RETICCTPCT in the last 72 hours. Sepsis Labs: Recent Labs  Lab 03/19/20 1801 03/20/20 1517 03/21/20 0234 03/22/20 0102  PROCALCITON  --  0.57 0.53 0.44  LATICACIDVEN 1.6 1.0  --   --     Recent Results (from the past 240 hour(s))  Respiratory Panel by RT PCR (Flu A&B, Covid) - Nasopharyngeal Swab     Status: None   Collection Time: 03/19/20 10:22 PM   Specimen: Nasopharyngeal Swab  Result Value Ref Range Status   SARS Coronavirus 2 by RT PCR NEGATIVE NEGATIVE Final    Comment: (NOTE) SARS-CoV-2 target nucleic acids are NOT DETECTED. The SARS-CoV-2 RNA is generally detectable in upper respiratoy  specimens during the acute phase of infection. The lowest concentration of SARS-CoV-2 viral copies this assay can detect is 131 copies/mL. A negative result does not preclude SARS-Cov-2 infection and should not be used as the sole basis for treatment or other patient management decisions. A negative result may occur with  improper specimen collection/handling, submission of specimen other than nasopharyngeal swab, presence of viral mutation(s) within the areas targeted by this assay, and inadequate number of viral copies (<131 copies/mL). A negative result must be combined with clinical observations, patient history, and epidemiological information. The expected result is Negative. Fact Sheet for Patients:  PinkCheek.be Fact Sheet for Healthcare Providers:  GravelBags.it This test is not yet ap proved or cleared by the [place] FDA and  has been authorized for detection and/or diagnosis of SARS-CoV-2 by FDA under an Emergency Use Authorization (EUA). This EUA will remain  in effect (meaning this test can be used) for the duration of the COVID-19 declaration under Section 564(b)(1) of the Act, 21 U.S.C. section 360bbb-3(b)(1), unless the authorization is terminated or revoked sooner.    Influenza A by PCR NEGATIVE  NEGATIVE Final   Influenza B by PCR NEGATIVE NEGATIVE Final    Comment: (NOTE) The Xpert Xpress SARS-CoV-2/FLU/RSV assay is intended as an aid in  the diagnosis of influenza from Nasopharyngeal swab specimens and  should not be used as a sole basis for treatment. Nasal washings and  aspirates are unacceptable for Xpert Xpress SARS-CoV-2/FLU/RSV  testing. Fact Sheet for Patients: PinkCheek.be Fact Sheet for Healthcare Providers: GravelBags.it This test is not yet approved or cleared by the [place] FDA and  has been authorized for detection and/or diagnosis of SARS-CoV-2 by  FDA under an Emergency Use Authorization (EUA). This EUA will remain  in effect (meaning this test can be used) for the duration of the  Covid-19 declaration under Section 564(b)(1) of the Act, 21  U.S.C. section 360bbb-3(b)(1), unless the authorization is  terminated or revoked. Performed at [facility], [place] [address] [place], [place] [address]   Urine culture     Status: None   Collection Time: 03/19/20 10:39 PM   Specimen: Urine, Random  Result Value Ref Range Status   Specimen Description URINE, RANDOM  Final   Special Requests NONE  Final   Culture   Final    NO GROWTH Performed at [facility], [place] [address], [place], [place] [address]    Report Status 03/20/2020 FINAL  Final  Culture, blood (routine x 2)     Status: Abnormal   Collection Time: 03/20/20  7:45 AM   Specimen: BLOOD RIGHT FOREARM  Result Value Ref Range Status   Specimen Description BLOOD RIGHT FOREARM  Final   Special Requests   Final    BOTTLES DRAWN AEROBIC ONLY Blood Culture results may not be optimal due to an inadequate volume of blood received in culture bottles   Culture  Setup Time   Final    AEROBIC BOTTLE ONLY GRAM POSITIVE COCCI CRITICAL RESULT CALLED TO, READ BACK BY AND VERIFIED WITH: K AMEND [facility] 03/21/20 0059 JDW Performed at [facility], [place] [address] [place], [place] [address]    Culture ENTEROCOCCUS FAECALIS (A)  Final   Report Status 03/22/2020 FINAL  Final   Organism ID, Bacteria ENTEROCOCCUS FAECALIS  Final      Susceptibility   Enterococcus faecalis - MIC*    AMPICILLIN <=2 SENSITIVE Sensitive     VANCOMYCIN 1 SENSITIVE  Sensitive     GENTAMICIN SYNERGY SENSITIVE Sensitive     * ENTEROCOCCUS FAECALIS  Blood Culture ID Panel (Reflexed)     Status: Abnormal   Collection Time: 03/20/20  7:45 AM  Result Value Ref Range Status   Enterococcus species DETECTED (A) NOT DETECTED Final    Comment: CRITICAL RESULT CALLED TO, READ BACK BY AND VERIFIED WITH: K AMEND PHARMD 03/21/20 0059 JDW    Vancomycin resistance NOT DETECTED NOT DETECTED Final   Listeria monocytogenes NOT DETECTED NOT DETECTED Final   Staphylococcus species NOT DETECTED NOT DETECTED Final   Staphylococcus aureus (BCID) NOT DETECTED NOT DETECTED Final   Streptococcus species NOT DETECTED NOT DETECTED Final   Streptococcus agalactiae NOT DETECTED NOT DETECTED Final   Streptococcus pneumoniae NOT DETECTED NOT DETECTED Final   Streptococcus pyogenes NOT DETECTED NOT DETECTED Final   Acinetobacter baumannii NOT DETECTED NOT DETECTED Final   Enterobacteriaceae species NOT DETECTED NOT DETECTED Final   Enterobacter cloacae complex NOT DETECTED NOT DETECTED Final   Escherichia coli NOT DETECTED NOT DETECTED Final   Klebsiella oxytoca NOT DETECTED NOT DETECTED Final   Klebsiella pneumoniae NOT DETECTED NOT DETECTED Final   Proteus species NOT DETECTED NOT DETECTED Final   Serratia marcescens NOT DETECTED NOT DETECTED Final   Haemophilus influenzae NOT DETECTED NOT DETECTED Final   Neisseria meningitidis NOT DETECTED NOT DETECTED Final   Pseudomonas aeruginosa NOT DETECTED NOT DETECTED Final   Candida albicans NOT DETECTED NOT DETECTED Final   Candida glabrata NOT DETECTED NOT DETECTED Final   Candida krusei NOT DETECTED NOT DETECTED Final   Candida  parapsilosis NOT DETECTED NOT DETECTED Final   Candida tropicalis NOT DETECTED NOT DETECTED Final    Comment: Performed at [facility], [address], [place], [place] [address]  Culture, blood (routine x 2)     Status: Abnormal   Collection Time: 03/20/20  8:55 AM   Specimen: BLOOD  Result Value Ref Range Status   Specimen Description BLOOD LEFT ANTECUBITAL  Final   Special Requests   Final    BOTTLES DRAWN AEROBIC AND ANAEROBIC Blood Culture results may not be optimal due to an inadequate volume of blood received in culture bottles   Culture  Setup Time   Final    IN BOTH AEROBIC AND ANAEROBIC BOTTLES GRAM POSITIVE COCCI CRITICAL VALUE NOTED.  VALUE IS CONSISTENT WITH PREVIOUSLY REPORTED AND CALLED VALUE.    Culture (A)  Final    ENTEROCOCCUS FAECALIS SUSCEPTIBILITIES PERFORMED ON PREVIOUS CULTURE WITHIN THE LAST 5 DAYS. Performed at [facility], [place] [address] [place], [place] [address]    Report Status 03/22/2020 FINAL  Final  Body fluid culture     Status: None   Collection Time: 03/21/20  4:42 PM   Specimen: Body Fluid  Result Value Ref Range Status   Specimen Description FLUID SYNOVIAL LEFT KNEE  Final   Special Requests SYRINGE  Final   Gram Stain   Final    FEW WBC PRESENT,BOTH PMN AND MONONUCLEAR NO ORGANISMS SEEN    Culture   Final    RARE ENTEROCOCCUS FAECALIS CRITICAL RESULT CALLED TO, READ BACK BY AND VERIFIED WITH: RN A MITCHELL [id number] AT 46 AM BY CM Performed at [facility], [place] [address], [place], [place] [address]    Report Status 03/24/2020 FINAL  Final   Organism ID, Bacteria ENTEROCOCCUS FAECALIS  Final      Susceptibility   Enterococcus faecalis - MIC*  AMPICILLIN <=2 SENSITIVE Sensitive     VANCOMYCIN 1 SENSITIVE Sensitive     GENTAMICIN SYNERGY SENSITIVE Sensitive     * RARE ENTEROCOCCUS FAECALIS  Gram stain     Status: None   Collection Time: 03/22/20  8:36 PM   Specimen: Synovium; Body Fluid  Result Value Ref Range  Status   Specimen Description SYNOVIAL RIGHT KNEE  Final   Special Requests NONE  Final   Gram Stain   Final    ABUNDANT WBC PRESENT, PREDOMINANTLY PMN NO ORGANISMS SEEN Performed at [facility], [address] [place], [place] [address]    Report Status 03/22/2020 FINAL  Final  Culture, body fluid-bottle     Status: None (Preliminary result)   Collection Time: 03/22/20  8:42 PM   Specimen: Synovium  Result Value Ref Range Status   Specimen Description SYNOVIAL RIGHT KNEE  Final   Special Requests   Final    BOTTLES DRAWN AEROBIC ONLY Blood Culture adequate volume   Culture   Final    NO GROWTH 3 DAYS Performed at [facility], [address] [place], [place] [address]    Report Status PENDING  Incomplete  Culture, blood (routine x 2)     Status: None (Preliminary result)   Collection Time: 03/23/20  5:12 PM   Specimen: BLOOD  Result Value Ref Range Status   Specimen Description BLOOD LEFT ANTECUBITAL  Final   Special Requests   Final    BOTTLES DRAWN AEROBIC ONLY Blood Culture results may not be optimal due to an inadequate volume of blood received in culture bottles   Culture   Final    NO GROWTH 2 DAYS Performed at [facility], [place] [address], [place], [place] [address]    Report Status PENDING  Incomplete  Culture, blood (routine x 2)     Status: None (Preliminary result)   Collection Time: 03/23/20  5:12 PM   Specimen: BLOOD RIGHT HAND  Result Value Ref Range Status   Specimen Description BLOOD RIGHT HAND  Final   Special Requests   Final    BOTTLES DRAWN AEROBIC ONLY Blood Culture adequate volume   Culture   Final    NO GROWTH 2 DAYS Performed at [facility], [place] [address], [place], [place] [address]    Report Status PENDING  Incomplete  Surgical pcr screen     Status: None   Collection Time: 03/24/20  2:21 AM   Specimen: Nasal Mucosa; Nasal Swab  Result Value Ref Range Status   MRSA, PCR NEGATIVE NEGATIVE Final   Staphylococcus  aureus NEGATIVE NEGATIVE Final    Comment: (NOTE) The Xpert SA Assay (FDA approved for NASAL specimens in patients 76 years of age and older), is one component of a comprehensive surveillance program. It is not intended to diagnose infection nor to guide or monitor treatment. Performed at [facility], [place] [address], [place], [place] [address]      Radiology Studies: No results found.  Scheduled Meds: . docusate sodium  100 mg Oral BID  . feeding supplement (ENSURE ENLIVE)  237 mL Oral TID BM  . insulin aspart  0-15 Units Subcutaneous TID WC  . insulin aspart  0-5 Units Subcutaneous QHS  . insulin aspart  3 Units Subcutaneous TID WC  . insulin glargine  10 Units Subcutaneous BID  . iron polysaccharides  150 mg Oral Q breakfast  . levothyroxine  25 mcg Oral Q0600  .  midodrine  10 mg Oral TID WC  . senna  1 tablet Oral BID   Continuous Infusions: . ampicillin (OMNIPEN) IV 2 g (03/25/20 0935)  . cefTRIAXone (ROCEPHIN)  IV 2 g (03/25/20 0518)  . heparin 1,500 Units/hr (03/25/20 1431)     LOS: 5 days   Marylu Lund, MD [facility] Pager On Amion  If 7PM-7AM, please contact night-coverage 03/25/2020, 3:05 PM

## 2020-03-25 NOTE — Progress Notes (Signed)
PT Cancellation Note  Patient Details Name: Johnathan Murray MRN: [id number] DOB: April 28, 1951   Cancelled Treatment:    Reason Eval/Treat Not Completed: Patient declined, no reason specified. Pt refusing twice today secondary to fatigue. PT will continue to f/u with pt acutely as available.    [place] 03/25/2020, 2:59 PM

## 2020-03-25 NOTE — Progress Notes (Addendum)
Subjective: 1 Day Post-Op s/p Procedure(s): ARTHROSCOPY IRRIGATION AND DEBRIDEMENT BILATERAL  KNEE   Patient is alert, oriented, sitting on side of bed. Patient reports pain to be moderate in bilateral knees right worse than left, back has also been bothering him a lot due to extended periods of laying in bed. Denies nausea or vomiting. Denies calf pain. Patient states "knees feel better than I thought they would, but no better than yesterday".  Objective:  PE: VITALS:   Vitals:   03/24/20 1926 03/24/20 2024 03/25/20 0607 03/25/20 0937  BP: 140/90 132/61 116/79 127/72  Pulse: 77 68 96 90  Resp: 17 18 18 18   Temp: 97.6 F (36.4 C) 97.8 F (36.6 C) 98.3 F (36.8 C) 98.4 F (36.9 C)  TempSrc:   Oral Oral  SpO2: 100% 100% 98% 100%  Weight:      Height:       General: Alert, oriented, in no acute distress. GI: soft, non-tender BLE: Operative dressings intact, no drainage noted. Will plan to change these tomorrow AM. Sitting on side of bed, able to move right and left knee 45-90 degrees with more pain on flexion. Distal sensation intact. EHL and FHL intact. Able to move all toes of bilateral feet. Bilateral knee continue to have moderate effusions.   LABS  Results for orders placed or performed during the hospital encounter of 03/19/20 (from the past 24 hour(s))  Glucose, capillary     Status: Abnormal   Collection Time: 03/24/20 12:58 PM  Result Value Ref Range   Glucose-Capillary 198 (H) 70 - 99 mg/dL  Prepare RBC (crossmatch)     Status: None   Collection Time: 03/24/20  2:07 PM  Result Value Ref Range   Order Confirmation      ORDER PROCESSED BY BLOOD BANK Performed at [facility], [place] [address], [place], [place] [address]   Glucose, capillary     Status: Abnormal   Collection Time: 03/24/20  3:17 PM  Result Value Ref Range   Glucose-Capillary 167 (H) 70 - 99 mg/dL  Glucose, capillary     Status: Abnormal   Collection Time: 03/24/20  6:43 PM  Result Value  Ref Range   Glucose-Capillary 229 (H) 70 - 99 mg/dL   Comment 1 Notify RN    Comment 2 Document in Chart   Glucose, capillary     Status: Abnormal   Collection Time: 03/24/20  8:22 PM  Result Value Ref Range   Glucose-Capillary 261 (H) 70 - 99 mg/dL  Comprehensive metabolic panel     Status: Abnormal   Collection Time: 03/25/20  5:15 AM  Result Value Ref Range   Sodium 134 (L) 135 - 145 mmol/L   Potassium 4.3 3.5 - 5.1 mmol/L   Chloride 99 98 - 111 mmol/L   CO2 21 (L) 22 - 32 mmol/L   Glucose, Bld 307 (H) 70 - 99 mg/dL   BUN 39 (H) 8 - 23 mg/dL   Creatinine, Ser 1.91 (H) 0.61 - 1.24 mg/dL   Calcium 9.1 8.9 - 10.3 mg/dL   Total Protein 6.3 (L) 6.5 - 8.1 g/dL   Albumin 2.1 (L) 3.5 - 5.0 g/dL   AST 42 (H) 15 - 41 U/L   ALT 167 (H) 0 - 44 U/L   Alkaline Phosphatase 102 38 - 126 U/L   Total Bilirubin 2.0 (H) 0.3 - 1.2 mg/dL   GFR calc non Af Amer 35 (L) >60 mL/min   GFR calc Af  Amer 41 (L) >60 mL/min   Anion gap 14 5 - 15  CBC with Differential/Platelet     Status: Abnormal   Collection Time: 03/25/20  5:15 AM  Result Value Ref Range   WBC 23.1 (H) 4.0 - 10.5 K/uL   RBC 2.34 (L) 4.22 - 5.81 MIL/uL   Hemoglobin 7.1 (L) 13.0 - 17.0 g/dL   HCT 21.4 (L) 39.0 - 52.0 %   MCV 91.5 80.0 - 100.0 fL   MCH 30.3 26.0 - 34.0 pg   MCHC 33.2 30.0 - 36.0 g/dL   RDW 15.7 (H) 11.5 - 15.5 %   Platelets 420 (H) 150 - 400 K/uL   nRBC 0.0 0.0 - 0.2 %   Neutrophils Relative % 82 %   Neutro Abs 19.0 (H) 1.7 - 7.7 K/uL   Lymphocytes Relative 7 %   Lymphs Abs 1.6 0.7 - 4.0 K/uL   Monocytes Relative 5 %   Monocytes Absolute 1.2 (H) 0.1 - 1.0 K/uL   Eosinophils Relative 0 %   Eosinophils Absolute 0.0 0.0 - 0.5 K/uL   Basophils Relative 0 %   Basophils Absolute 0.0 0.0 - 0.1 K/uL   Immature Granulocytes 6 %   Abs Immature Granulocytes 1.36 (H) 0.00 - 0.07 K/uL  Glucose, capillary     Status: Abnormal   Collection Time: 03/25/20  6:50 AM  Result Value Ref Range   Glucose-Capillary 323 (H) 70 -  99 mg/dL  Glucose, capillary     Status: Abnormal   Collection Time: 03/25/20 11:23 AM  Result Value Ref Range   Glucose-Capillary 191 (H) 70 - 99 mg/dL    No results found.  Assessment/Plan: Principal Problem:   Enterococcal bacteremia Active Problems:   Essential hypertension   PAF (paroxysmal atrial fibrillation) (HCC)   Abnormal LFTs (liver function tests)   Cervical myelopathy (HCC)   S/P aortic valve replacement   ARF (acute renal failure) (HCC)   Pseudogout of left knee   Septic arthritis (HCC)   History of fusion of cervical spine   Protein-calorie malnutrition, severe  Septic arthritis of knees 1 day post op of bilateral knee arthroscopic irrigation and debridement - Initially aspiration of left knee showed intracellular calcium pyrophosphate crystals with only 15,000 white blood cells -  consistent with pseudogout, however after 2 days, his Gram stain and culture and sensitivity have grew out Enterococcus faecalis - no growth on aspiration of right knee at this time - antibiotics per ID - patient can continue to WBAT bilateral knees, though encouraged to keep in full extension as much as possible - dressings to be reinforced as needed, will change operative dressings tomorrow and leave open to air if no significant drainage - He does have nylon stitches which will need be taken out in 7-10 days, will do so in the hospital if he is still inpatient at that time, otherwise this can be done as outpatient by [place]  - PT and OT on board, will continue to follow for their recommendations  Contact information:   Weekdays 8-5 Merlene Pulling, PA-C [phone] A fter hours and holidays please check [email or web address] for group call information for [facility]  Ventura Bruns 03/25/2020, 12:49 PM

## 2020-03-26 ENCOUNTER — Encounter (HOSPITAL_COMMUNITY)
Admission: EM | Disposition: A | Discharge status: Rehabilitation Facility | Payer: Self-pay | Source: Home / Self Care | Attending: Internal Medicine

## 2020-03-26 ENCOUNTER — Inpatient Hospital Stay (HOSPITAL_COMMUNITY): Payer: BC Managed Care – PPO | Admitting: Certified Registered Nurse Anesthetist

## 2020-03-26 ENCOUNTER — Inpatient Hospital Stay (HOSPITAL_COMMUNITY): Payer: BC Managed Care – PPO

## 2020-03-26 ENCOUNTER — Other Ambulatory Visit: Payer: Self-pay | Admitting: Cardiology

## 2020-03-26 ENCOUNTER — Inpatient Hospital Stay: Payer: Self-pay

## 2020-03-26 ENCOUNTER — Encounter (HOSPITAL_COMMUNITY): Payer: Self-pay | Admitting: Internal Medicine

## 2020-03-26 DIAGNOSIS — I480 Paroxysmal atrial fibrillation: Secondary | ICD-10-CM | POA: Diagnosis not present

## 2020-03-26 DIAGNOSIS — I340 Nonrheumatic mitral (valve) insufficiency: Secondary | ICD-10-CM | POA: Diagnosis not present

## 2020-03-26 DIAGNOSIS — I10 Essential (primary) hypertension: Secondary | ICD-10-CM | POA: Diagnosis not present

## 2020-03-26 DIAGNOSIS — R7881 Bacteremia: Secondary | ICD-10-CM

## 2020-03-26 HISTORY — PX: TEE WITHOUT CARDIOVERSION: SHX5443

## 2020-03-26 LAB — CBC WITH DIFFERENTIAL/PLATELET
Abs Immature Granulocytes: 0 10*3/uL (ref 0.00–0.07)
Basophils Absolute: 0 10*3/uL (ref 0.0–0.1)
Basophils Relative: 0 %
Eosinophils Absolute: 0 10*3/uL (ref 0.0–0.5)
Eosinophils Relative: 0 %
HCT: 19.8 % — ABNORMAL LOW (ref 39.0–52.0)
Hemoglobin: 6.3 g/dL — CL (ref 13.0–17.0)
Lymphocytes Relative: 4 %
Lymphs Abs: 0.7 10*3/uL (ref 0.7–4.0)
MCH: 29.4 pg (ref 26.0–34.0)
MCHC: 31.8 g/dL (ref 30.0–36.0)
MCV: 92.5 fL (ref 80.0–100.0)
Monocytes Absolute: 0.3 10*3/uL (ref 0.1–1.0)
Monocytes Relative: 2 %
Neutro Abs: 16.2 10*3/uL — ABNORMAL HIGH (ref 1.7–7.7)
Neutrophils Relative %: 94 %
Platelets: 332 10*3/uL (ref 150–400)
RBC: 2.14 MIL/uL — ABNORMAL LOW (ref 4.22–5.81)
RDW: 15.8 % — ABNORMAL HIGH (ref 11.5–15.5)
WBC: 17.2 10*3/uL — ABNORMAL HIGH (ref 4.0–10.5)
nRBC: 0 % (ref 0.0–0.2)
nRBC: 0 /100 WBC

## 2020-03-26 LAB — COMPREHENSIVE METABOLIC PANEL
ALT: 109 U/L — ABNORMAL HIGH (ref 0–44)
AST: 28 U/L (ref 15–41)
Albumin: 1.9 g/dL — ABNORMAL LOW (ref 3.5–5.0)
Alkaline Phosphatase: 87 U/L (ref 38–126)
Anion gap: 11 (ref 5–15)
BUN: 27 mg/dL — ABNORMAL HIGH (ref 8–23)
CO2: 25 mmol/L (ref 22–32)
Calcium: 8.8 mg/dL — ABNORMAL LOW (ref 8.9–10.3)
Chloride: 103 mmol/L (ref 98–111)
Creatinine, Ser: 1.62 mg/dL — ABNORMAL HIGH (ref 0.61–1.24)
GFR calc Af Amer: 50 mL/min — ABNORMAL LOW (ref >60)
GFR calc non Af Amer: 43 mL/min — ABNORMAL LOW (ref >60)
Glucose, Bld: 162 mg/dL — ABNORMAL HIGH (ref 70–99)
Potassium: 4.4 mmol/L (ref 3.5–5.1)
Sodium: 139 mmol/L (ref 135–145)
Total Bilirubin: 1.4 mg/dL — ABNORMAL HIGH (ref 0.3–1.2)
Total Protein: 5.7 g/dL — ABNORMAL LOW (ref 6.5–8.1)

## 2020-03-26 LAB — HEMOGLOBIN AND HEMATOCRIT, BLOOD
HCT: 28.7 % — ABNORMAL LOW (ref 39.0–52.0)
Hemoglobin: 9.6 g/dL — ABNORMAL LOW (ref 13.0–17.0)

## 2020-03-26 LAB — APTT: aPTT: 93 seconds — ABNORMAL HIGH (ref 24–36)

## 2020-03-26 LAB — GLUCOSE, CAPILLARY
Glucose-Capillary: 143 mg/dL — ABNORMAL HIGH (ref 70–99)
Glucose-Capillary: 173 mg/dL — ABNORMAL HIGH (ref 70–99)
Glucose-Capillary: 155 mg/dL — ABNORMAL HIGH (ref 70–99)
Glucose-Capillary: 170 mg/dL — ABNORMAL HIGH (ref 70–99)
Glucose-Capillary: 157 mg/dL — ABNORMAL HIGH (ref 70–99)
Glucose-Capillary: 115 mg/dL — ABNORMAL HIGH (ref 70–99)

## 2020-03-26 LAB — HEPARIN LEVEL (UNFRACTIONATED): Heparin Unfractionated: 0.85 IU/mL — ABNORMAL HIGH (ref 0.30–0.70)

## 2020-03-26 LAB — PREPARE RBC (CROSSMATCH)

## 2020-03-26 SURGERY — ECHOCARDIOGRAM, TRANSESOPHAGEAL
Anesthesia: Monitor Anesthesia Care

## 2020-03-26 MED ORDER — SODIUM CHLORIDE 0.9 % IV SOLN: INTRAVENOUS | Status: DC | PRN

## 2020-03-26 MED ORDER — PROPOFOL 500 MG/50ML IV EMUL
INTRAVENOUS | Status: DC | PRN
  Administered 2020-03-26: 100 ug/kg/min via INTRAVENOUS

## 2020-03-26 MED ORDER — SODIUM CHLORIDE 0.9% IV SOLUTION: Freq: Once | INTRAVENOUS | Status: AC

## 2020-03-26 MED ORDER — ACETAMINOPHEN 325 MG PO TABS
650.0000 mg | ORAL_TABLET | Freq: Four times a day (QID) | ORAL | Status: DC | PRN
  Administered 2020-03-26 – 2020-04-06 (×5): 650 mg via ORAL
  Filled 2020-03-26 (×5): qty 2

## 2020-03-26 MED ORDER — PHENYLEPHRINE HCL (PRESSORS) 10 MG/ML IV SOLN
INTRAVENOUS | Status: DC | PRN
  Administered 2020-03-26: 160 ug via INTRAVENOUS

## 2020-03-26 MED ORDER — SODIUM CHLORIDE 0.9 % IV SOLN: INTRAVENOUS | Status: DC

## 2020-03-26 MED ORDER — PROPOFOL 10 MG/ML IV BOLUS
INTRAVENOUS | Status: DC | PRN
Start: 2020-03-26 — End: 2020-03-26
  Administered 2020-03-26 (×2): 20 mg via INTRAVENOUS

## 2020-03-26 MED ORDER — LACTATED RINGERS IV SOLN
INTRAVENOUS | Status: DC
  Administered 2020-03-26: 1000 mL via INTRAVENOUS

## 2020-03-26 NOTE — Interval H&P Note (Signed)
History and Physical Interval Note:  03/26/2020 8:09 AM  Johnathan Murray  has presented today for surgery, with the diagnosis of BACTERIUM.  The various methods of treatment have been discussed with the patient and family. After consideration of risks, benefits and other options for treatment, the patient has consented to  Procedure(s): TRANSESOPHAGEAL ECHOCARDIOGRAM (TEE) (N/A) as a surgical intervention.  The patient's history has been reviewed, patient examined, no change in status, stable for surgery.  I have reviewed the patient's chart and labs.  Questions were answered to the patient's satisfaction.     [employer]

## 2020-03-26 NOTE — Transfer of Care (Signed)
Immediate Anesthesia Transfer of Care Note  Patient: Johnathan Murray  Procedure(s) Performed: TRANSESOPHAGEAL ECHOCARDIOGRAM (TEE) (N/A )  Patient Location: Endoscopy Unit  Anesthesia Type:MAC  Level of Consciousness: awake, alert  and oriented  Airway & Oxygen Therapy: Patient Spontanous Breathing  Post-op Assessment: Report given to RN, Post -op Vital signs reviewed and stable and Patient moving all extremities  Post vital signs: Reviewed and stable  Last Vitals:  Vitals Value Taken Time  BP 132/70 03/26/20 0913  Temp    Pulse 87 03/26/20 0913  Resp 19 03/26/20 0913  SpO2 100 % 03/26/20 0913  Vitals shown include unvalidated device data.  Last Pain:  Vitals:   03/26/20 0912  TempSrc:   PainSc: 0-No pain      Patients Stated Pain Goal: 1 (92/76/39 [id number])  Complications: No apparent anesthesia complications

## 2020-03-26 NOTE — Anesthesia Preprocedure Evaluation (Signed)
Anesthesia Evaluation  Patient identified by MRN, date of birth, ID band Patient awake    Reviewed: Allergy & Precautions, NPO status , Patient's Chart, lab work & pertinent test results  History of Anesthesia Complications Negative for: history of anesthetic complications  Airway Mallampati: I  TM Distance: >3 FB Neck ROM: Full    Dental  (+) Edentulous Upper, Missing, Dental Advisory Given   Pulmonary shortness of breath,    breath sounds clear to auscultation       Cardiovascular hypertension, + CAD and + CABG  + dysrhythmias Atrial Fibrillation + Valvular Problems/Murmurs (s/p AVR 12/2019)  Rhythm:Irregular Rate:Normal     Neuro/Psych    GI/Hepatic   Endo/Other  diabetes, Oral Hypoglycemic AgentsHypothyroidism   Renal/GU Renal InsufficiencyRenal disease (creat 1.62)     Musculoskeletal   Abdominal   Peds  Hematology  (+) Blood dyscrasia (Hb 6.3), anemia , Eliquis   Anesthesia Other Findings   Reproductive/Obstetrics                             Anesthesia Physical Anesthesia Plan  ASA: III  Anesthesia Plan: MAC   Post-op Pain Management:    Induction:   PONV Risk Score and Plan: 1 and Ondansetron and Treatment may vary due to age or medical condition  Airway Management Planned: Nasal Cannula and Natural Airway  Additional Equipment:   Intra-op Plan:   Post-operative Plan:   Informed Consent: I have reviewed the patients History and Physical, chart, labs and discussed the procedure including the risks, benefits and alternatives for the proposed anesthesia with the patient or authorized representative who has indicated his/her understanding and acceptance.     Dental advisory given  Plan Discussed with: CRNA and Surgeon  Anesthesia Plan Comments:         Anesthesia Quick Evaluation

## 2020-03-26 NOTE — Progress Notes (Signed)
PROGRESS NOTE    Johnathan Murray  [id number] DOB: Nov 18, 1951 DOA: 03/19/2020 PCP: Nicoletta Dress, MD    Brief Narrative:  68 y.o.malewithhistory of paroxysmal atrial fibrillation, hypertension, bioprosthetic aortic valve replacement who had undergone recent cervical discectomy and fusion about 3 weeks ago done by Dr. Trenton Gammon has been feeling weak with increasing pain in the neck and low back and poor appetite. Denies any vomiting or diarrhea denies taking any Tylenol but does take NSAIDs sometimes. Has not noticed any blood in the urine and has been urinating less last few days. Has subjective feeling of fever chills. Given the symptoms patient presents to the ER. Patient states over the last 3 weeks and surgery patient has been finding it difficult to ambulate because of the weakness of the lower extremity. Denies any incontinence of urine or bowel. Has been having more lower extremity edema more on the left side. In the ER on exam patient appears generally weak with weakness of the lower extremity. There is edema of the lower extremity on the left side. Labs show creatinine of 6.4 which is increased from 1.6 about 3 weeks ago. In addition patient's LFTs are markedly elevated at AST of 415 and ALT of 429 patient's albumin is 2.6 total bilirubin 5.5 bicarb 19 sodium 130 calcium 10.2 hemoglobin 10.4 lactic acid 1.6 WBC 12.6 platelets 341 urinalysis shows RBCs of 21-50 WBC 21-50 protein around 100 bacteria rare hyaline cast present mucus present. Patient was given fluid bolus and admitted for acute renal failure. Patient also had CT abdomen pelvis and CT cervical spine which did not show any acute. USS of the abdomen also was unremarkable. Covid test is negative  Assessment & Plan:   Principal Problem:   Enterococcal bacteremia Active Problems:   Essential hypertension   PAF (paroxysmal atrial fibrillation) (HCC)   Abnormal LFTs (liver function tests)   Cervical myelopathy  (HCC)   S/P aortic valve replacement   ARF (acute renal failure) (HCC)   Pseudogout of left knee   Septic arthritis (HCC)   History of fusion of cervical spine   Protein-calorie malnutrition, severe   AKI on CKD stage III/anion gap metabolic acidosis Improving Multifactorial from bacteremia, poor oral intake, in addition to medication induced from NSAID, Lasix and lisinopril use Baseline creatinine 1.3-1.6, on presentation 6.40 Noted urine with RBCs and protein-further work-up Per nephrology IVF now stopped Holding patient's Lasix, lisinopril CT abdomen does not show any obstruction Nephrology consulted earlier, now signed off Recheck bmet in AM  Sepsis likely 2/2 Enterococcus faecalis bacteremia On presentation, patient was hypotensive, with leukocytosis Source from possible knee infection vs recent postoperative cervical/lumbar infection Afebrile, with leukocytosis (steroid in knee) BC x2 growing Enterococcus faecalis, repeat BC x2 NGTD UC no growth, Chest x-ray unremarkable Lactic acid, TSH WNL, cortisol WNL Procalcitonin trending down TTE showed no obvious vegetation but increased transaortic gradients across the bioprosthetic valve  Cardiology was consulted for TEE. Pt underwent TEE 5/6. Valves unremarkable, no vegetation seen. Discussed with Cardiology  Likely LLE septic knee with ruptured Baker's cyst with possibly pseudogout Doppler showed ruptured Baker's cyst on LLE Left knee x-ray showed large joint effusion, septic joint cannot be excluded Orthopedics consulted s/p LLE joint aspiration on 03/21/2020 and RLE joint aspiration on 03/22/2020 both showed positive intracellular calcium pyrophosphate crystals, WBC over 15,000, culture grew rare E faecalis from LLE, culture from RLE without organisms Status post steroid injection in both knees on 03/22/2020 by orthopedics Now s/p I&D of bilateral knees on  03/24/2020, seems stable  Hyperglycemia Likely 2/2 recent steroid  injection in the knee Continue with SSI, Accu-Cheks, hypoglycemic protocol Glycemic trends seem stable  Transaminitis Suspect secondary to above sepsis/hypotension/shock liver INR WNL Acute hepatitis panel nonreactive Holding home amiodarone Recheck cmp in AM  Paroxysmal A. Fib/bioprosthetic aortic valve replacement Heart rate WNL Holding home amiodarone TTE as above Cont on tele Now off heparin secondary to transfusion dependent anemia per below  Neck and back pain History of recent cervical decompression surgery Imaging does not show anything acute as mentioned above MRI cervical spine with no evidence of infection Pain management, if pain worsens/persists we will consult Dr. Trenton Gammon patient's neurosurgeon PT/OT consulted  Normocytic anemia/postop anemia Anemia panel iron 43, sats 29, ferritin 3,811 Continued on oral iron supplementation Hgb down to 6.8 this AM. Have transfused 2 units PRBC's No evidence of acute blood loss Discussed with Cardiology. Plan to hold heparin for now. If no bleeding and if hgb stable over the next 48hrs, consider resuming xarelto at that time  Hypothyroidism TSH levels WNL Continue Synthroid as pt tolerates  DVT prophylaxis: Heparin gtt Code Status: Full Family Communication: Pt in room, family at bedside  Status is: Inpatient  Remains inpatient appropriate because:Unsafe d/c plan, Inpatient level of care appropriate due to severity of illness and need to monitor for stability of hgb following blood tranfusion   Dispo: The patient is from: Home              Anticipated d/c is to: CIR              Anticipated d/c date is: 2 days              Patient currently is not medically stable to d/c.  Consultants:   ID  Orthopedic Surgery  Critical Care  Nephrology  Procedures:   TEE 5/6  Antimicrobials: Anti-infectives (From admission, onward)   Start     Dose/Rate Route Frequency Ordered Stop   03/22/20 1515  cefTRIAXone  (ROCEPHIN) 2 g in sodium chloride 0.9 % 100 mL IVPB  Status:  Discontinued     2 g 200 mL/hr over 30 Minutes Intravenous Every 12 hours 03/22/20 1442 03/26/20 1043   03/22/20 1000  ampicillin (OMNIPEN) 2 g in sodium chloride 0.9 % 100 mL IVPB     2 g 300 mL/hr over 20 Minutes Intravenous Every 6 hours 03/22/20 0925     03/21/20 1800  ceFEPIme (MAXIPIME) 1 g in sodium chloride 0.9 % 100 mL IVPB  Status:  Discontinued     1 g 200 mL/hr over 30 Minutes Intravenous Every 24 hours 03/20/20 1143 03/21/20 0116   03/21/20 0200  ampicillin (OMNIPEN) 2 g in sodium chloride 0.9 % 100 mL IVPB  Status:  Discontinued     2 g 300 mL/hr over 20 Minutes Intravenous Every 8 hours 03/21/20 0116 03/22/20 0925   03/20/20 1230  ceFEPIme (MAXIPIME) 1 g in sodium chloride 0.9 % 100 mL IVPB     1 g 200 mL/hr over 30 Minutes Intravenous  Once 03/20/20 1143 03/20/20 1358      Subjective: Still with some back pains, overall feels fatigued  Objective: Vitals:   03/26/20 0912 03/26/20 0920 03/26/20 0922 03/26/20 0947  BP: 132/70  (!) 144/74 (!) 147/78  Pulse: 89 85 80 85  Resp: 19 19 19 20   Temp: 99.1 F (37.3 C)   99.2 F (37.3 C)  TempSrc:    Oral  SpO2: 100% 99% 100% 97%  Weight:      Height:        Intake/Output Summary (Last 24 hours) at 03/26/2020 1433 Last data filed at 03/26/2020 1425 Gross per 24 hour  Intake 2029.83 ml  Output 2575 ml  Net -545.17 ml   Filed Weights   03/24/20 1318 03/24/20 1551 03/26/20 0735  Weight: 82 kg 78.5 kg 78.5 kg    Examination: General exam: Awake, laying in bed, in nad Respiratory system: Normal respiratory effort, no wheezing Cardiovascular system: regular rate, s1, s2 Gastrointestinal system: Soft, nondistended, positive BS Central nervous system: CN2-12 grossly intact, strength intact Extremities: Perfused, no clubbing Skin: Normal skin turgor, no notable skin lesions seen Psychiatry: Mood normal // no visual hallucinations   Data Reviewed: I have  personally reviewed following labs and imaging studies  CBC: Recent Labs  Lab 03/22/20 0102 03/22/20 0102 03/23/20 0641 03/24/20 0410 03/25/20 0515 03/26/20 0618 03/26/20 1300  WBC 13.7*  --  17.3* 22.2* 23.1* 17.2*  --   NEUTROABS 10.6*  --  16.4* 19.7* 19.0* 16.2*  --   HGB 8.1*   < > 7.4* 7.1* 7.1* 6.3* 9.6*  HCT 22.8*   < > 22.0* 21.2* 21.4* 19.8* 28.7*  MCV 86.0  --  88.4 89.1 91.5 92.5  --   PLT 316  --  303 329 420* 332  --    < > = values in this interval not displayed.   Basic Metabolic Panel: Recent Labs  Lab 03/22/20 0102 03/23/20 0641 03/24/20 0410 03/25/20 0515 03/26/20 0618  NA 133* 136 134* 134* 139  K 4.1 4.6 4.8 4.3 4.4  CL 101 104 102 99 103  CO2 22 20* 21* 21* 25  GLUCOSE 88 322* 369* 307* 162*  BUN 52* 38* 44* 39* 27*  CREATININE 2.62* 1.93* 2.01* 1.91* 1.62*  CALCIUM 8.8* 9.0 9.2 9.1 8.8*   GFR: Estimated Creatinine Clearance: 45.7 mL/min (A) (by C-G formula based on SCr of 1.62 mg/dL (H)). Liver Function Tests: Recent Labs  Lab 03/22/20 0102 03/23/20 0641 03/24/20 0410 03/25/20 0515 03/26/20 0618  AST 295* 74* 45* 42* 28  ALT 458* 253* 189* 167* 109*  ALKPHOS 143* 117 103 102 87  BILITOT 4.7* 3.8* 2.2* 2.0* 1.4*  PROT 6.3* 6.2* 6.0* 6.3* 5.7*  ALBUMIN 1.8* 1.8* 1.9* 2.1* 1.9*   No results for input(s): LIPASE, AMYLASE in the last 168 hours. No results for input(s): AMMONIA in the last 168 hours. Coagulation Profile: Recent Labs  Lab 03/20/20 0331  INR 1.8*   Cardiac Enzymes: Recent Labs  Lab 03/19/20 1801 03/20/20 0331  CKTOTAL 20* 54   BNP (last 3 results) No results for input(s): PROBNP in the last 8760 hours. HbA1C: No results for input(s): HGBA1C in the last 72 hours. CBG: Recent Labs  Lab 03/25/20 2139 03/26/20 0428 03/26/20 0800 03/26/20 0948 03/26/20 1123  GLUCAP 173* 143* 173* 155* 170*   Lipid Profile: No results for input(s): CHOL, HDL, LDLCALC, TRIG, CHOLHDL, LDLDIRECT in the last 72 hours. Thyroid  Function Tests: No results for input(s): TSH, T4TOTAL, FREET4, T3FREE, THYROIDAB in the last 72 hours. Anemia Panel: No results for input(s): VITAMINB12, FOLATE, FERRITIN, TIBC, IRON, RETICCTPCT in the last 72 hours. Sepsis Labs: Recent Labs  Lab 03/19/20 1801 03/20/20 1517 03/21/20 0234 03/22/20 0102  PROCALCITON  --  0.57 0.53 0.44  LATICACIDVEN 1.6 1.0  --   --     Recent Results (from the past 240 hour(s))  Respiratory Panel by RT PCR (Flu A&B,  Covid) - Nasopharyngeal Swab     Status: None   Collection Time: 03/19/20 10:22 PM   Specimen: Nasopharyngeal Swab  Result Value Ref Range Status   SARS Coronavirus 2 by RT PCR NEGATIVE NEGATIVE Final    Comment: (NOTE) SARS-CoV-2 target nucleic acids are NOT DETECTED. The SARS-CoV-2 RNA is generally detectable in upper respiratoy specimens during the acute phase of infection. The lowest concentration of SARS-CoV-2 viral copies this assay can detect is 131 copies/mL. A negative result does not preclude SARS-Cov-2 infection and should not be used as the sole basis for treatment or other patient management decisions. A negative result may occur with  improper specimen collection/handling, submission of specimen other than nasopharyngeal swab, presence of viral mutation(s) within the areas targeted by this assay, and inadequate number of viral copies (<131 copies/mL). A negative result must be combined with clinical observations, patient history, and epidemiological information. The expected result is Negative. Fact Sheet for Patients:  PinkCheek.be Fact Sheet for Healthcare Providers:  GravelBags.it This test is not yet ap proved or cleared by the [place] FDA and  has been authorized for detection and/or diagnosis of SARS-CoV-2 by FDA under an Emergency Use Authorization (EUA). This EUA will remain  in effect (meaning this test can be used) for the duration of  the COVID-19 declaration under Section 564(b)(1) of the Act, 21 U.S.C. section 360bbb-3(b)(1), unless the authorization is terminated or revoked sooner.    Influenza A by PCR NEGATIVE NEGATIVE Final   Influenza B by PCR NEGATIVE NEGATIVE Final    Comment: (NOTE) The Xpert Xpress SARS-CoV-2/FLU/RSV assay is intended as an aid in  the diagnosis of influenza from Nasopharyngeal swab specimens and  should not be used as a sole basis for treatment. Nasal washings and  aspirates are unacceptable for Xpert Xpress SARS-CoV-2/FLU/RSV  testing. Fact Sheet for Patients: PinkCheek.be Fact Sheet for Healthcare Providers: GravelBags.it This test is not yet approved or cleared by the [place] FDA and  has been authorized for detection and/or diagnosis of SARS-CoV-2 by  FDA under an Emergency Use Authorization (EUA). This EUA will remain  in effect (meaning this test can be used) for the duration of the  Covid-19 declaration under Section 564(b)(1) of the Act, 21  U.S.C. section 360bbb-3(b)(1), unless the authorization is  terminated or revoked. Performed at [facility], [place] [address], [place], [place] [address]   Urine culture     Status: None   Collection Time: 03/19/20 10:39 PM   Specimen: Urine, Random  Result Value Ref Range Status   Specimen Description URINE, RANDOM  Final   Special Requests NONE  Final   Culture   Final    NO GROWTH Performed at [facility], [place] [address], [place], [place] [address]    Report Status 03/20/2020 FINAL  Final  Culture, blood (routine x 2)     Status: Abnormal   Collection Time: 03/20/20  7:45 AM   Specimen: BLOOD RIGHT FOREARM  Result Value Ref Range Status   Specimen Description BLOOD RIGHT FOREARM  Final   Special Requests   Final    BOTTLES DRAWN AEROBIC ONLY Blood Culture results may not be optimal due to an inadequate volume of blood received in culture bottles    Culture  Setup Time   Final    AEROBIC BOTTLE ONLY GRAM POSITIVE COCCI CRITICAL RESULT CALLED TO, READ BACK BY AND VERIFIED WITH: K AMEND [facility] 03/21/20 0059 JDW Performed at [facility], [address], [place], [place] [address]    Culture ENTEROCOCCUS FAECALIS (A)  Final   Report Status 03/22/2020 FINAL  Final   Organism ID, Bacteria ENTEROCOCCUS FAECALIS  Final      Susceptibility   Enterococcus faecalis - MIC*    AMPICILLIN <=2 SENSITIVE Sensitive     VANCOMYCIN 1 SENSITIVE Sensitive     GENTAMICIN SYNERGY SENSITIVE Sensitive     * ENTEROCOCCUS FAECALIS  Blood Culture ID Panel (Reflexed)     Status: Abnormal   Collection Time: 03/20/20  7:45 AM  Result Value Ref Range Status   Enterococcus species DETECTED (A) NOT DETECTED Final    Comment: CRITICAL RESULT CALLED TO, READ BACK BY AND VERIFIED WITH: K AMEND PHARMD 03/21/20 0059 JDW    Vancomycin resistance NOT DETECTED NOT DETECTED Final   Listeria monocytogenes NOT DETECTED NOT DETECTED Final   Staphylococcus species NOT DETECTED NOT DETECTED Final   Staphylococcus aureus (BCID) NOT DETECTED NOT DETECTED Final   Streptococcus species NOT DETECTED NOT DETECTED Final   Streptococcus agalactiae NOT DETECTED NOT DETECTED Final   Streptococcus pneumoniae NOT DETECTED NOT DETECTED Final   Streptococcus pyogenes NOT DETECTED NOT DETECTED Final   Acinetobacter baumannii NOT DETECTED NOT DETECTED Final   Enterobacteriaceae species NOT DETECTED NOT DETECTED Final   Enterobacter cloacae complex NOT DETECTED NOT DETECTED Final   Escherichia coli NOT DETECTED NOT DETECTED Final   Klebsiella oxytoca NOT DETECTED NOT DETECTED Final   Klebsiella pneumoniae NOT DETECTED NOT DETECTED Final   Proteus species NOT DETECTED NOT DETECTED Final   Serratia marcescens NOT DETECTED NOT DETECTED Final   Haemophilus influenzae NOT DETECTED NOT DETECTED Final   Neisseria meningitidis NOT DETECTED NOT DETECTED Final   Pseudomonas aeruginosa NOT  DETECTED NOT DETECTED Final   Candida albicans NOT DETECTED NOT DETECTED Final   Candida glabrata NOT DETECTED NOT DETECTED Final   Candida krusei NOT DETECTED NOT DETECTED Final   Candida parapsilosis NOT DETECTED NOT DETECTED Final   Candida tropicalis NOT DETECTED NOT DETECTED Final    Comment: Performed at [facility], [place] [address], [place], [place] [address]  Culture, blood (routine x 2)     Status: Abnormal   Collection Time: 03/20/20  8:55 AM   Specimen: BLOOD  Result Value Ref Range Status   Specimen Description BLOOD LEFT ANTECUBITAL  Final   Special Requests   Final    BOTTLES DRAWN AEROBIC AND ANAEROBIC Blood Culture results may not be optimal due to an inadequate volume of blood received in culture bottles   Culture  Setup Time   Final    IN BOTH AEROBIC AND ANAEROBIC BOTTLES GRAM POSITIVE COCCI CRITICAL VALUE NOTED.  VALUE IS CONSISTENT WITH PREVIOUSLY REPORTED AND CALLED VALUE.    Culture (A)  Final    ENTEROCOCCUS FAECALIS SUSCEPTIBILITIES PERFORMED ON PREVIOUS CULTURE WITHIN THE LAST 5 DAYS. Performed at [facility], [place] [address], [place], [place] [address]    Report Status 03/22/2020 FINAL  Final  Body fluid culture     Status: None   Collection Time: 03/21/20  4:42 PM   Specimen: Body Fluid  Result Value Ref Range Status   Specimen Description FLUID SYNOVIAL LEFT KNEE  Final   Special Requests SYRINGE  Final   Gram Stain   Final    FEW WBC PRESENT,BOTH PMN AND MONONUCLEAR NO ORGANISMS SEEN    Culture   Final    RARE ENTEROCOCCUS FAECALIS CRITICAL RESULT CALLED TO,  READ BACK BY AND VERIFIED WITH: RN A MITCHELL [id number] AT [id number] AM BY CM Performed at [facility], [place] [address], [place], [place] [address]    Report Status 03/24/2020 FINAL  Final   Organism ID, Bacteria ENTEROCOCCUS FAECALIS  Final      Susceptibility   Enterococcus faecalis - MIC*    AMPICILLIN <=2 SENSITIVE Sensitive     VANCOMYCIN 1 SENSITIVE Sensitive     GENTAMICIN  SYNERGY SENSITIVE Sensitive     * RARE ENTEROCOCCUS FAECALIS  Gram stain     Status: None   Collection Time: 03/22/20  8:36 PM   Specimen: Synovium; Body Fluid  Result Value Ref Range Status   Specimen Description SYNOVIAL RIGHT KNEE  Final   Special Requests NONE  Final   Gram Stain   Final    ABUNDANT WBC PRESENT, PREDOMINANTLY PMN NO ORGANISMS SEEN Performed at [facility], [address] [place], [place] [address]    Report Status 03/22/2020 FINAL  Final  Culture, body fluid-bottle     Status: None (Preliminary result)   Collection Time: 03/22/20  8:42 PM   Specimen: Synovium  Result Value Ref Range Status   Specimen Description SYNOVIAL RIGHT KNEE  Final   Special Requests   Final    BOTTLES DRAWN AEROBIC ONLY Blood Culture adequate volume   Culture   Final    NO GROWTH 4 DAYS Performed at [facility], [place] [address] [place], [place] [address]    Report Status PENDING  Incomplete  Culture, blood (routine x 2)     Status: None (Preliminary result)   Collection Time: 03/23/20  5:12 PM   Specimen: BLOOD  Result Value Ref Range Status   Specimen Description BLOOD LEFT ANTECUBITAL  Final   Special Requests   Final    BOTTLES DRAWN AEROBIC ONLY Blood Culture results may not be optimal due to an inadequate volume of blood received in culture bottles   Culture   Final    NO GROWTH 3 DAYS Performed at [facility], [place] [address], [place], [place] [address]    Report Status PENDING  Incomplete  Culture, blood (routine x 2)     Status: None (Preliminary result)   Collection Time: 03/23/20  5:12 PM   Specimen: BLOOD RIGHT HAND  Result Value Ref Range Status   Specimen Description BLOOD RIGHT HAND  Final   Special Requests   Final    BOTTLES DRAWN AEROBIC ONLY Blood Culture adequate volume   Culture   Final    NO GROWTH 3 DAYS Performed at [facility], [place] [address] [place], [place] [address]    Report Status PENDING  Incomplete  Surgical pcr  screen     Status: None   Collection Time: 03/24/20  2:21 AM   Specimen: Nasal Mucosa; Nasal Swab  Result Value Ref Range Status   MRSA, PCR NEGATIVE NEGATIVE Final   Staphylococcus aureus NEGATIVE NEGATIVE Final    Comment: (NOTE) The Xpert SA Assay (FDA approved for NASAL specimens in patients 2 years of age and older), is one component of a comprehensive surveillance program. It is not intended to diagnose infection nor to guide or monitor treatment. Performed at [facility], [place] [address], [place], [place] [address]      Radiology Studies: ECHO TEE  Result Date: 03/26/2020    TRANSESOPHOGEAL ECHO REPORT   Patient Name:   MCKADE SOOS Wilmeth Date of  Exam: 03/26/2020 Medical Rec #:  [id number]           Height:       70.0 in Accession #:    [id number]          Weight:       173.0 lb Date of Birth:  20-Aug-1951           BSA:          1.963 m Patient Age:    64 years            BP:           127/92 mmHg Patient Gender: M                   HR:           76 bpm. Exam Location:  Inpatient Procedure: Transesophageal Echo, Color Doppler, Cardiac Doppler and 3D Echo Indications:     Bacteremia R78.81  History:         Patient has prior history of Echocardiogram examinations, most                  recent 03/22/2020.                  Aortic Valve: 23 mm Edwards bioprosthetic valve is present in                  the aortic position. Procedure Date: 08/16/2016.  Sonographer:     Mikki Santee RDCS (AE) Referring Phys:  [id number] Erasmo Downer FURTH Diagnosing Phys: Candee Furbish MD PROCEDURE: After discussion of the risks and benefits of a TEE, an informed consent was obtained. The transesophogeal probe was passed without difficulty through the esophogus of the patient. Sedation performed by different physician. The patient was monitored while under deep sedation. Anesthestetic sedation was provided intravenously by Anesthesiology: 228.4mg  of Propofol. The patient's vital signs; including heart rate, blood  pressure, and oxygen saturation; remained stable throughout the procedure. The patient developed no complications during the procedure. IMPRESSIONS  1. Left ventricular ejection fraction, by estimation, is 60 to 65%. The left ventricle has normal function. The left ventricle has no regional wall motion abnormalities.  2. Right ventricular systolic function is normal. The right ventricular size is normal. There is normal pulmonary artery systolic pressure.  3. No left atrial/left atrial appendage thrombus was detected.  4. The mitral valve is myxomatous. Moderate mitral valve regurgitation. No evidence of mitral stenosis.  5. The aortic valve has been repaired/replaced. Aortic valve regurgitation is not visualized. No aortic stenosis is present. There is a 23 mm Edwards bioprosthetic valve present in the aortic position. Procedure Date: 08/16/2016. Echo findings are consistent with normal structure and function of the aortic valve prosthesis.  6. The inferior vena cava is normal in size with greater than 50% respiratory variability, suggesting right atrial pressure of 3 mmHg. Conclusion(s)/Recommendation(s): Normal biventricular function without evidence of hemodynamically significant valvular heart disease. No evidence of vegetation/infective endocarditis on this transesophageal echocardiogram. FINDINGS  Left Ventricle: Left ventricular ejection fraction, by estimation, is 60 to 65%. The left ventricle has normal function. The left ventricle has no regional wall motion abnormalities. The left ventricular internal cavity size was normal in size. There is  no left ventricular hypertrophy. Right Ventricle: The right ventricular size is normal. No increase in right ventricular wall thickness. Right ventricular systolic function is normal. There is normal pulmonary artery systolic pressure. The tricuspid regurgitant velocity is 2.41 m/s, and  with an assumed right atrial pressure of 10 mmHg, the estimated right  ventricular systolic pressure is [id number] mmHg. Left Atrium: Left atrial size was normal in size. No left atrial/left atrial appendage thrombus was detected. Right Atrium: Right atrial size was normal in size. Pericardium: There is no evidence of pericardial effusion. Mitral Valve: The mitral valve is myxomatous. There is mild holosystolic prolapse of multiple segments of the anterior leaflet of the mitral valve. Normal mobility of the mitral valve leaflets. Moderate mitral valve regurgitation, with eccentric posteriorly directed jet. No evidence of mitral valve stenosis. Tricuspid Valve: The tricuspid valve is normal in structure. Tricuspid valve regurgitation is not demonstrated. No evidence of tricuspid stenosis. Aortic Valve: The aortic valve has been repaired/replaced. Aortic valve regurgitation is not visualized. No aortic stenosis is present. There is a 23 mm Edwards bioprosthetic valve present in the aortic position. Procedure Date: 08/16/2016. Echo findings are consistent with normal structure and function of the aortic valve prosthesis. Pulmonic Valve: The pulmonic valve was normal in structure. Pulmonic valve regurgitation is not visualized. No evidence of pulmonic stenosis. Aorta: The aortic root is normal in size and structure. Venous: The inferior vena cava is normal in size with greater than 50% respiratory variability, suggesting right atrial pressure of 3 mmHg. IAS/Shunts: No atrial level shunt detected by color flow Doppler.  TRICUSPID VALVE TR Peak grad:   23.2 mmHg TR Vmax:        241.00 cm/s Candee Furbish MD Electronically signed by Candee Furbish MD Signature Date/Time: 03/26/2020/10:49:18 AM    Final     Scheduled Meds: . sodium chloride   Intravenous Once  . sodium chloride   Intravenous Once  . docusate sodium  100 mg Oral BID  . feeding supplement (ENSURE ENLIVE)  237 mL Oral TID BM  . insulin aspart  0-15 Units Subcutaneous TID WC  . insulin aspart  0-5 Units Subcutaneous QHS  . insulin  aspart  3 Units Subcutaneous TID WC  . insulin glargine  10 Units Subcutaneous BID  . iron polysaccharides  150 mg Oral Q breakfast  . levothyroxine  25 mcg Oral Q0600  . midodrine  10 mg Oral TID WC  . senna  1 tablet Oral BID   Continuous Infusions: . ampicillin (OMNIPEN) IV 2 g (03/26/20 1118)     LOS: 6 days   Marylu Lund, MD [facility] Pager On Amion  If 7PM-7AM, please contact night-coverage 03/26/2020, 2:33 PM

## 2020-03-26 NOTE — Progress Notes (Signed)
Occupational Therapy Treatment Patient Details Name: Johnathan Murray MRN: [id number] DOB: Aug 05, 1951 Today's Date: 03/26/2020    History of present illness Pt is a 69 y/o male s/p ACDF C3-4 on 4/12, admitted to [facility] on 4/30 for progressive weakness, fatigue, bilateral knee pain L>R. Pt with AKI, bacteremia, L septic knee with plan for I&D bilateral knees on 5/4 pm . PMHx includes previous ACDF C4-6 (04/2019), lumbar sx (05/2019), CAD, DM, HTN, hx of CABG   OT comments  Cotx with PT to maximize pt safety and functional performance. Pt's wife present for session. Pt reports feeling frustrated due to current level of deficits. Pt instructed on hand placement for sit/stand transfer, requiring mod assist x 2 to power up into standing. Pt tolerated standing and ambulating ~4 min around bed with RW. Pt initially required mod assist x 2 for mobility, progressing to min assist x 2 for balance and safety. Pt required increased time to complete task. Pt engaged in toileting task while seated EOB. Educated pt on safety strategies and compensatory techniques for LB dressing task while seated EOB. Pt able to don/doff underwear and doff socks with min assist, utilizing figure four position. Discussed use of reacher in the future to complete task with pt not very receptive. Pt assisted back to bed and positioned for comfort. OT will continue to follow acutely. Recommend SNF placement for additional rehab prior to discharge home.    Follow Up Recommendations  SNF;Supervision/Assistance - 24 hour    Equipment Recommendations  Other (comment)(TBD at next venue of care. Likely [facility])    Recommendations for Other Services      Precautions / Restrictions Precautions Precautions: Fall;Cervical Restrictions Weight Bearing Restrictions: No       Mobility Bed Mobility Overal bed mobility: Needs Assistance Bed Mobility: Supine to Sit;Sit to Supine     Supine to sit: Supervision;HOB elevated Sit to supine:  Supervision;HOB elevated   General bed mobility comments: Increased time to complete. Utilized bed rails. Pt utilized BUEs to move BLEs onto/off of bed.   Transfers Overall transfer level: Needs assistance Equipment used: Rolling walker (2 wheeled) Transfers: Sit to/from Stand Sit to Stand: Mod assist;+2 physical assistance         General transfer comment: Cues for hand placement. Extensive assist to power up into standing from low surface.     Balance Overall balance assessment: Needs assistance Sitting-balance support: Feet supported;No upper extremity supported Sitting balance-Leahy Scale: Fair       Standing balance-Leahy Scale: Poor Standing balance comment: heavy reliance on RW with BUEs.                            ADL either performed or assessed with clinical judgement   ADL Overall ADL's : Needs assistance/impaired                     Lower Body Dressing: Minimal assistance;Sitting/lateral leans Lower Body Dressing Details (indicate cue type and reason): Pt able to doff socks with min assist, utilizing figure four position. Pt able to don/doff underwear with min assist to thread RLE.              Functional mobility during ADLs: Minimal assistance;Moderate assistance;+2 for physical assistance;+2 for safety/equipment;Rolling walker General ADL Comments: Pt able to stand and ambulate ~4+ min around bed with RW. Pt initially required mod assist, progressing to min assist. 0 instances of LOB, however pt unsteady on feet. Cues  for safety and hand placement on RW. Increased time needed to complete.      Vision       Perception     Praxis      Cognition Arousal/Alertness: Awake/alert Behavior During Therapy: Flat affect Overall Cognitive Status: Impaired/Different from baseline Area of Impairment: Safety/judgement                         Safety/Judgement: Decreased awareness of safety;Decreased awareness of deficits      General Comments: Cues for safety throughout        Exercises     Shoulder Instructions       General Comments Pt's wife [name] present for session. Pt's HR increased into 140s during activity. No signs/symptoms of distress.     Pertinent Vitals/ Pain       Pain Assessment: Faces Faces Pain Scale: Hurts even more Pain Location: Back and bilateral knees, left knee more than right Pain Descriptors / Indicators: Cramping;Discomfort Pain Intervention(s): Limited activity within patient's tolerance;Monitored during session;Repositioned  Home Living                                          Prior Functioning/Environment              Frequency           Progress Toward Goals  OT Goals(current goals can now be found in the care plan section)  Progress towards OT goals: Progressing toward goals  ADL Goals Pt Will Perform Grooming: with min assist;with min guard assist;standing;with caregiver independent in assisting Pt Will Perform Lower Body Bathing: with mod assist;sitting/lateral leans;sit to/from stand;with adaptive equipment;with caregiver independent in assisting Pt Will Perform Upper Body Dressing: with modified independence;sitting Pt Will Perform Lower Body Dressing: with max assist;with mod assist;sitting/lateral leans;sit to/from stand;with adaptive equipment;with caregiver independent in assisting Pt Will Transfer to Toilet: with min assist;stand pivot transfer;bedside commode Pt Will Perform Toileting - Clothing Manipulation and hygiene: with max assist;with mod assist;sit to/from stand;with caregiver independent in assisting Additional ADL Goal #1: Pt will sit EOB using proper body mechanocs in prep for ADLs/selfcare  Plan Discharge plan needs to be updated    Co-evaluation    PT/OT/SLP Co-Evaluation/Treatment: Yes Reason for Co-Treatment: For patient/therapist safety;To address functional/ADL transfers   OT goals addressed during  session: ADL's and self-care;Strengthening/ROM      AM-PAC OT "6 Clicks" Daily Activity     Outcome Measure   Help from another person eating meals?: None Help from another person taking care of personal grooming?: A Little Help from another person toileting, which includes using toliet, bedpan, or urinal?: A Lot Help from another person bathing (including washing, rinsing, drying)?: A Lot Help from another person to put on and taking off regular upper body clothing?: A Little Help from another person to put on and taking off regular lower body clothing?: A Lot 6 Click Score: 16    End of Session Equipment Utilized During Treatment: Rolling walker;Gait belt  OT Visit Diagnosis: Unsteadiness on feet (R26.81);Muscle weakness (generalized) (M62.81);Other abnormalities of gait and mobility (R26.89);Pain Pain - Right/Left: (bilateral) Pain - part of body: Knee   Activity Tolerance Patient limited by fatigue;Patient limited by pain   Patient Left in bed;with call bell/phone within reach;with family/visitor present   Nurse Communication Mobility status        Time:  [id number] OT Time Calculation (min): 28 min  Charges: OT General Charges $OT Visit: 1 Visit OT Treatments $Self Care/Home Management : 8-22 mins  Mauri Brooklyn OTR/L [phone]   Mauri Brooklyn 03/26/2020, 2:56 PM

## 2020-03-26 NOTE — Progress Notes (Signed)
Consent obtained for PICC placement after risks, benefits, and alternatives discussed with patient and wife. Requested waiting until 03-27-20 AM for placement. Primary RN Hnghus notified.

## 2020-03-26 NOTE — Progress Notes (Addendum)
Subjective:  Patient laying comfortably in bed. He reports pain to be mild in bilateral knees, improved since irrigation and debridement. Continue to complain of swelling in knees and feet. Denies calf pain. Denies nausea or vomiting. He was happy with progress he made while walking with walker in room today.   Objective:  PE: VITALS:   Vitals:   03/26/20 0920 03/26/20 0922 03/26/20 0947 03/26/20 1629  BP:  (!) 144/74 (!) 147/78 135/71  Pulse: 85 80 85 98  Resp: 19 19 20 18   Temp:   99.2 F (37.3 C) (!) 100.7 F (38.2 C)  TempSrc:   Oral Oral  SpO2: 99% 100% 97% 98%  Weight:      Height:       General: Alert, oriented, in no acute distress. GI: soft, non-tender BLE: Operative dressings removed, minimal drainage from operative sites. Nylon sutures in place. Patient siting in bed with knees in extension about to perform about 5-45 degrees of flexion of right knee and 10-40 of left knee. Distal sensation intact. EHL and FHL intact. 2+ DP pulse bilaterally. Able to move all toes of bilateral feet. Bilateral knee continue to have moderate effusions. Edematous dorsum of bilateral feet, left worse than right.   LABS  Results for orders placed or performed during the hospital encounter of 03/19/20 (from the past 24 hour(s))  Glucose, capillary     Status: Abnormal   Collection Time: 03/25/20  9:39 PM  Result Value Ref Range   Glucose-Capillary 173 (H) 70 - 99 mg/dL  Glucose, capillary     Status: Abnormal   Collection Time: 03/26/20  4:28 AM  Result Value Ref Range   Glucose-Capillary 143 (H) 70 - 99 mg/dL  CBC with Differential/Platelet     Status: Abnormal   Collection Time: 03/26/20  6:18 AM  Result Value Ref Range   WBC 17.2 (H) 4.0 - 10.5 K/uL   RBC 2.14 (L) 4.22 - 5.81 MIL/uL   Hemoglobin 6.3 (LL) 13.0 - 17.0 g/dL   HCT 19.8 (L) 39.0 - 52.0 %   MCV 92.5 80.0 - 100.0 fL   MCH 29.4 26.0 - 34.0 pg   MCHC 31.8 30.0 - 36.0 g/dL   RDW 15.8 (H) 11.5 - 15.5 %   Platelets  332 150 - 400 K/uL   nRBC 0.0 0.0 - 0.2 %   Neutrophils Relative % 94 %   Neutro Abs 16.2 (H) 1.7 - 7.7 K/uL   Lymphocytes Relative 4 %   Lymphs Abs 0.7 0.7 - 4.0 K/uL   Monocytes Relative 2 %   Monocytes Absolute 0.3 0.1 - 1.0 K/uL   Eosinophils Relative 0 %   Eosinophils Absolute 0.0 0.0 - 0.5 K/uL   Basophils Relative 0 %   Basophils Absolute 0.0 0.0 - 0.1 K/uL   WBC Morphology See Note    nRBC 0 0 /100 WBC   Abs Immature Granulocytes 0.00 0.00 - 0.07 K/uL  Heparin level (unfractionated)     Status: Abnormal   Collection Time: 03/26/20  6:18 AM  Result Value Ref Range   Heparin Unfractionated 0.85 (H) 0.30 - 0.70 IU/mL  APTT     Status: Abnormal   Collection Time: 03/26/20  6:18 AM  Result Value Ref Range   aPTT 93 (H) 24 - 36 seconds  Comprehensive metabolic panel     Status: Abnormal   Collection Time: 03/26/20  6:18 AM  Result Value Ref Range   Sodium 139 135 - 145  mmol/L   Potassium 4.4 3.5 - 5.1 mmol/L   Chloride 103 98 - 111 mmol/L   CO2 25 22 - 32 mmol/L   Glucose, Bld 162 (H) 70 - 99 mg/dL   BUN 27 (H) 8 - 23 mg/dL   Creatinine, Ser 1.62 (H) 0.61 - 1.24 mg/dL   Calcium 8.8 (L) 8.9 - 10.3 mg/dL   Total Protein 5.7 (L) 6.5 - 8.1 g/dL   Albumin 1.9 (L) 3.5 - 5.0 g/dL   AST 28 15 - 41 U/L   ALT 109 (H) 0 - 44 U/L   Alkaline Phosphatase 87 38 - 126 U/L   Total Bilirubin 1.4 (H) 0.3 - 1.2 mg/dL   GFR calc non Af Amer 43 (L) >60 mL/min   GFR calc Af Amer 50 (L) >60 mL/min   Anion gap 11 5 - 15  Prepare RBC (crossmatch)     Status: None   Collection Time: 03/26/20  7:43 AM  Result Value Ref Range   Order Confirmation      ORDER PROCESSED BY BLOOD BANK BB SAMPLE OR UNITS ALREADY AVAILABLE Performed at [facility], [address] [place], [place] [address]   Prepare RBC (crossmatch)     Status: None   Collection Time: 03/26/20  8:00 AM  Result Value Ref Range   Order Confirmation      ORDER PROCESSED BY BLOOD BANK DUPLICATE REQUEST Performed at [facility], [place] [address] [place], [place] [address]   Glucose, capillary     Status: Abnormal   Collection Time: 03/26/20  8:00 AM  Result Value Ref Range   Glucose-Capillary 173 (H) 70 - 99 mg/dL  Glucose, capillary     Status: Abnormal   Collection Time: 03/26/20  9:48 AM  Result Value Ref Range   Glucose-Capillary 155 (H) 70 - 99 mg/dL  Glucose, capillary     Status: Abnormal   Collection Time: 03/26/20 11:23 AM  Result Value Ref Range   Glucose-Capillary 170 (H) 70 - 99 mg/dL  Hemoglobin and hematocrit, blood     Status: Abnormal   Collection Time: 03/26/20  1:00 PM  Result Value Ref Range   Hemoglobin 9.6 (L) 13.0 - 17.0 g/dL   HCT 28.7 (L) 39.0 - 52.0 %  Glucose, capillary     Status: Abnormal   Collection Time: 03/26/20  4:28 PM  Result Value Ref Range   Glucose-Capillary 157 (H) 70 - 99 mg/dL    ECHO TEE  Result Date: 03/26/2020    TRANSESOPHOGEAL ECHO REPORT   Patient Name:   Johnathan Murray Date of Exam: 03/26/2020 Medical Rec #:  [id number]           Height:       70.0 in Accession #:    [id number]          Weight:       173.0 lb Date of Birth:  27-Sep-1951           BSA:          1.963 m Patient Age:    69 years            BP:           127/92 mmHg Patient Gender: M                   HR:           76 bpm. Exam Location:  Inpatient Procedure: Transesophageal Echo, Color Doppler,  Cardiac Doppler and 3D Echo Indications:     Bacteremia R78.81  History:         Patient has prior history of Echocardiogram examinations, most                  recent 03/22/2020.                  Aortic Valve: 23 mm Edwards bioprosthetic valve is present in                  the aortic position. Procedure Date: 08/16/2016.  Sonographer:     Mikki Santee RDCS (AE) Referring Phys:  [id number] Erasmo Downer FURTH Diagnosing Phys: Candee Furbish MD PROCEDURE: After discussion of the risks and benefits of a TEE, an informed consent was obtained. The transesophogeal probe was passed without difficulty through the  esophogus of the patient. Sedation performed by different physician. The patient was monitored while under deep sedation. Anesthestetic sedation was provided intravenously by Anesthesiology: 228.4mg  of Propofol. The patient's vital signs; including heart rate, blood pressure, and oxygen saturation; remained stable throughout the procedure. The patient developed no complications during the procedure. IMPRESSIONS  1. Left ventricular ejection fraction, by estimation, is 60 to 65%. The left ventricle has normal function. The left ventricle has no regional wall motion abnormalities.  2. Right ventricular systolic function is normal. The right ventricular size is normal. There is normal pulmonary artery systolic pressure.  3. No left atrial/left atrial appendage thrombus was detected.  4. The mitral valve is myxomatous. Moderate mitral valve regurgitation. No evidence of mitral stenosis.  5. The aortic valve has been repaired/replaced. Aortic valve regurgitation is not visualized. No aortic stenosis is present. There is a 23 mm Edwards bioprosthetic valve present in the aortic position. Procedure Date: 08/16/2016. Echo findings are consistent with normal structure and function of the aortic valve prosthesis.  6. The inferior vena cava is normal in size with greater than 50% respiratory variability, suggesting right atrial pressure of 3 mmHg. Conclusion(s)/Recommendation(s): Normal biventricular function without evidence of hemodynamically significant valvular heart disease. No evidence of vegetation/infective endocarditis on this transesophageal echocardiogram. FINDINGS  Left Ventricle: Left ventricular ejection fraction, by estimation, is 60 to 65%. The left ventricle has normal function. The left ventricle has no regional wall motion abnormalities. The left ventricular internal cavity size was normal in size. There is  no left ventricular hypertrophy. Right Ventricle: The right ventricular size is normal. No increase in  right ventricular wall thickness. Right ventricular systolic function is normal. There is normal pulmonary artery systolic pressure. The tricuspid regurgitant velocity is 2.41 m/s, and  with an assumed right atrial pressure of 10 mmHg, the estimated right ventricular systolic pressure is [id number] mmHg. Left Atrium: Left atrial size was normal in size. No left atrial/left atrial appendage thrombus was detected. Right Atrium: Right atrial size was normal in size. Pericardium: There is no evidence of pericardial effusion. Mitral Valve: The mitral valve is myxomatous. There is mild holosystolic prolapse of multiple segments of the anterior leaflet of the mitral valve. Normal mobility of the mitral valve leaflets. Moderate mitral valve regurgitation, with eccentric posteriorly directed jet. No evidence of mitral valve stenosis. Tricuspid Valve: The tricuspid valve is normal in structure. Tricuspid valve regurgitation is not demonstrated. No evidence of tricuspid stenosis. Aortic Valve: The aortic valve has been repaired/replaced. Aortic valve regurgitation is not visualized. No aortic stenosis is present. There is a 23 mm Edwards bioprosthetic valve present in the aortic position.  Procedure Date: 08/16/2016. Echo findings are consistent with normal structure and function of the aortic valve prosthesis. Pulmonic Valve: The pulmonic valve was normal in structure. Pulmonic valve regurgitation is not visualized. No evidence of pulmonic stenosis. Aorta: The aortic root is normal in size and structure. Venous: The inferior vena cava is normal in size with greater than 50% respiratory variability, suggesting right atrial pressure of 3 mmHg. IAS/Shunts: No atrial level shunt detected by color flow Doppler.  TRICUSPID VALVE TR Peak grad:   23.2 mmHg TR Vmax:        241.00 cm/s Candee Furbish MD Electronically signed by Candee Furbish MD Signature Date/Time: 03/26/2020/10:49:18 AM    Final    [place] EKG SITE RITE  Result Date: 03/26/2020 If  Site Rite image not attached, placement could not be confirmed due to current cardiac rhythm.   Assessment/Plan: Principal Problem:   Enterococcal bacteremia Active Problems:   Essential hypertension   PAF (paroxysmal atrial fibrillation) (HCC)   Abnormal LFTs (liver function tests)   Cervical myelopathy (HCC)   S/P aortic valve replacement   ARF (acute renal failure) (HCC)   Pseudogout of left knee   Septic arthritis (HCC)   History of fusion of cervical spine   Protein-calorie malnutrition, severe  Septic arthritis of knees 2 day post op of bilateral knee arthroscopic irrigation and debridement - Initially aspiration of left knee showed intracellular calcium pyrophosphate crystals with only 15,000 white blood cells -  consistent with pseudogout, however after 2 days, his Gram stain and culture and sensitivity have grew out Enterococcus faecalis - no growth on aspiration of right knee at this time - antibiotics per ID - patient can continue to WBAT bilateral knees - dressings to be changed daily - He does have nylon stitches which will need be taken out in 7-10 days - these can be done at SNF or CIR depending on discharge plan, or in our office in one week - PT and OT on board, OT recommending SNF and last PT note recommending HHPT, ok from ortho standpoint to discharge    Ventura Bruns 03/26/2020, 5:08 PM

## 2020-03-26 NOTE — Progress Notes (Signed)
[facility] for Infectious Disease    Date of Admission:  03/19/2020   Total days of antibiotics 7           ID: Johnathan Murray is a 69 y.o. male with  Principal Problem:   Enterococcal bacteremia Active Problems:   Essential hypertension   PAF (paroxysmal atrial fibrillation) (HCC)   Abnormal LFTs (liver function tests)   Cervical myelopathy (HCC)   S/P aortic valve replacement   ARF (acute renal failure) (HCC)   Pseudogout of left knee   Septic arthritis (HCC)   History of fusion of cervical spine   Protein-calorie malnutrition, severe    Subjective: Had difficulty with ambulating with PT didn't realize how weak he has become. TEE did not show vegetation  Addendum: had fever tonight of 100.68F  Medications:  . sodium chloride   Intravenous Once  . sodium chloride   Intravenous Once  . docusate sodium  100 mg Oral BID  . feeding supplement (ENSURE ENLIVE)  237 mL Oral TID BM  . insulin aspart  0-15 Units Subcutaneous TID WC  . insulin aspart  0-5 Units Subcutaneous QHS  . insulin aspart  3 Units Subcutaneous TID WC  . insulin glargine  10 Units Subcutaneous BID  . iron polysaccharides  150 mg Oral Q breakfast  . levothyroxine  25 mcg Oral Q0600  . midodrine  10 mg Oral TID WC  . senna  1 tablet Oral BID    Objective: Vital signs in last 24 hours: Temp:  [98.9 F (37.2 C)-100.7 F (38.2 C)] 100.7 F (38.2 C) (05/06 1629) Pulse Rate:  [80-101] 98 (05/06 1629) Resp:  [16-20] 18 (05/06 1629) BP: (127-147)/(70-92) 135/71 (05/06 1629) SpO2:  [97 %-100 %] 98 % (05/06 1629) Weight:  [78.5 kg] 78.5 kg (05/06 0735) Physical Exam  Constitutional: He is oriented to person, place, and time. He appears well-developed and well-nourished. No distress.  HENT:  Mouth/Throat: Oropharynx is clear and moist. No oropharyngeal exudate.  Cardiovascular: Normal rate, regular rhythm and normal heart sounds. Exam reveals no gallop and no friction rub.  No murmur heard.   Pulmonary/Chest: Effort normal and breath sounds normal. No respiratory distress. He has no wheezes.  Abdominal: Soft. Bowel sounds are normal. He exhibits no distension. There is no tenderness.  Ext: left knee swelling > right knee. Bilateral sutures  Neurological: He is alert and oriented to person, place, and time.  Skin: Skin is warm and dry. No rash noted. No erythema.  Psychiatric: He has a normal mood and affect. His behavior is normal.    Lab Results Recent Labs    03/25/20 0515 03/25/20 0515 03/26/20 0618 03/26/20 1300  WBC 23.1*  --  17.2*  --   HGB 7.1*   < > 6.3* 9.6*  HCT 21.4*   < > 19.8* 28.7*  NA 134*  --  139  --   K 4.3  --  4.4  --   CL 99  --  103  --   CO2 21*  --  25  --   BUN 39*  --  27*  --   CREATININE 1.91*  --  1.62*  --    < > = values in this interval not displayed.   Liver Panel Recent Labs    03/25/20 0515 03/26/20 0618  PROT 6.3* 5.7*  ALBUMIN 2.1* 1.9*  AST 42* 28  ALT 167* 109*  ALKPHOS 102 87  BILITOT 2.0* 1.4*  Microbiology: Blood cx 5/3 ngtd Studies/Results: ECHO TEE  Result Date: 03/26/2020    TRANSESOPHOGEAL ECHO REPORT   Patient Name:   Johnathan Murray Date of Exam: 03/26/2020 Medical Rec #:  [id number]           Height:       70.0 in Accession #:    [id number]          Weight:       173.0 lb Date of Birth:  1951-06-01           BSA:          1.963 m Patient Age:    28 years            BP:           127/92 mmHg Patient Gender: M                   HR:           76 bpm. Exam Location:  Inpatient Procedure: Transesophageal Echo, Color Doppler, Cardiac Doppler and 3D Echo Indications:     Bacteremia R78.81  History:         Patient has prior history of Echocardiogram examinations, most                  recent 03/22/2020.                  Aortic Valve: 23 mm Edwards bioprosthetic valve is present in                  the aortic position. Procedure Date: 08/16/2016.  Sonographer:     Mikki Santee RDCS (AE) Referring Phys:  [id number]  Erasmo Downer FURTH Diagnosing Phys: Candee Furbish MD PROCEDURE: After discussion of the risks and benefits of a TEE, an informed consent was obtained. The transesophogeal probe was passed without difficulty through the esophogus of the patient. Sedation performed by different physician. The patient was monitored while under deep sedation. Anesthestetic sedation was provided intravenously by Anesthesiology: 228.4mg  of Propofol. The patient's vital signs; including heart rate, blood pressure, and oxygen saturation; remained stable throughout the procedure. The patient developed no complications during the procedure. IMPRESSIONS  1. Left ventricular ejection fraction, by estimation, is 60 to 65%. The left ventricle has normal function. The left ventricle has no regional wall motion abnormalities.  2. Right ventricular systolic function is normal. The right ventricular size is normal. There is normal pulmonary artery systolic pressure.  3. No left atrial/left atrial appendage thrombus was detected.  4. The mitral valve is myxomatous. Moderate mitral valve regurgitation. No evidence of mitral stenosis.  5. The aortic valve has been repaired/replaced. Aortic valve regurgitation is not visualized. No aortic stenosis is present. There is a 23 mm Edwards bioprosthetic valve present in the aortic position. Procedure Date: 08/16/2016. Echo findings are consistent with normal structure and function of the aortic valve prosthesis.  6. The inferior vena cava is normal in size with greater than 50% respiratory variability, suggesting right atrial pressure of 3 mmHg. Conclusion(s)/Recommendation(s): Normal biventricular function without evidence of hemodynamically significant valvular heart disease. No evidence of vegetation/infective endocarditis on this transesophageal echocardiogram. FINDINGS  Left Ventricle: Left ventricular ejection fraction, by estimation, is 60 to 65%. The left ventricle has normal function. The left ventricle has  no regional wall motion abnormalities. The left ventricular internal cavity size was normal in size. There is  no left ventricular hypertrophy. Right Ventricle: The right ventricular size  is normal. No increase in right ventricular wall thickness. Right ventricular systolic function is normal. There is normal pulmonary artery systolic pressure. The tricuspid regurgitant velocity is 2.41 m/s, and  with an assumed right atrial pressure of 10 mmHg, the estimated right ventricular systolic pressure is [id number] mmHg. Left Atrium: Left atrial size was normal in size. No left atrial/left atrial appendage thrombus was detected. Right Atrium: Right atrial size was normal in size. Pericardium: There is no evidence of pericardial effusion. Mitral Valve: The mitral valve is myxomatous. There is mild holosystolic prolapse of multiple segments of the anterior leaflet of the mitral valve. Normal mobility of the mitral valve leaflets. Moderate mitral valve regurgitation, with eccentric posteriorly directed jet. No evidence of mitral valve stenosis. Tricuspid Valve: The tricuspid valve is normal in structure. Tricuspid valve regurgitation is not demonstrated. No evidence of tricuspid stenosis. Aortic Valve: The aortic valve has been repaired/replaced. Aortic valve regurgitation is not visualized. No aortic stenosis is present. There is a 23 mm Edwards bioprosthetic valve present in the aortic position. Procedure Date: 08/16/2016. Echo findings are consistent with normal structure and function of the aortic valve prosthesis. Pulmonic Valve: The pulmonic valve was normal in structure. Pulmonic valve regurgitation is not visualized. No evidence of pulmonic stenosis. Aorta: The aortic root is normal in size and structure. Venous: The inferior vena cava is normal in size with greater than 50% respiratory variability, suggesting right atrial pressure of 3 mmHg. IAS/Shunts: No atrial level shunt detected by color flow Doppler.  TRICUSPID VALVE  TR Peak grad:   23.2 mmHg TR Vmax:        241.00 cm/s Candee Furbish MD Electronically signed by Candee Furbish MD Signature Date/Time: 03/26/2020/10:49:18 AM    Final    [place] EKG SITE RITE  Result Date: 03/26/2020 If Site Rite image not attached, placement could not be confirmed due to current cardiac rhythm.    Assessment/Plan: Ongoing fever = will repeat blood cx. Will cancel picc line and delay until he is 24hr without fever. Unclear source of fever since TEE is negative. May have other source of infection  Enterococcal bacteremia with septic arthritis of left knee (and inflammation of right knee?) = continue on ampicillin plus ceftriaxone for now -may consider to treat for presumed endocarditis or longer course since still having fevers, and /or if find another satelite site of infection  transaminitis = continue to monitor, steadily trending down  [facility] for Infectious Diseases Cell: [phone] Pager: [phone]  03/26/2020, 8:15 PM

## 2020-03-26 NOTE — CV Procedure (Addendum)
   Transesophageal Echocardiogram  Indications: Bacteremia  Time out performed  Anesthesia propofol and PRBC  Findings:  Left Ventricle: Normal EF  Mitral Valve: Moderate MR with anterior leaflet prolapse  Aortic Valve: TAVR valve intact, no vegetation, normal  Tricuspid Valve: Normal  Left Atrium: Normal, no thrombus  IMPRESSION: NO ENDOCARDITIS.   Continue to hold heparin IV until hemoglobin is stable  Candee Furbish, MD

## 2020-03-26 NOTE — Anesthesia Postprocedure Evaluation (Signed)
Anesthesia Post Note  Patient: Johnathan Murray  Procedure(s) Performed: TRANSESOPHAGEAL ECHOCARDIOGRAM (TEE) (N/A )     Patient location during evaluation: Endoscopy Anesthesia Type: MAC Level of consciousness: awake and alert, oriented and patient cooperative Pain management: pain level controlled Vital Signs Assessment: post-procedure vital signs reviewed and stable Respiratory status: spontaneous breathing, nonlabored ventilation, respiratory function stable and patient connected to nasal cannula oxygen Cardiovascular status: blood pressure returned to baseline and stable Postop Assessment: no apparent nausea or vomiting Anesthetic complications: no    Last Vitals:  Vitals:   03/26/20 0922 03/26/20 0947  BP: (!) 144/74 (!) 147/78  Pulse: 80 85  Resp: 19 20  Temp:  37.3 C  SpO2: 100% 97%    Last Pain:  Vitals:   03/26/20 0947  TempSrc: Oral  PainSc:                  [name],E. Lyah Millirons

## 2020-03-26 NOTE — Progress Notes (Signed)
PT Cancellation Note  Patient Details Name: Johnathan Murray MRN: [id number] DOB: 07/04/1951   Cancelled Treatment:    Reason Eval/Treat Not Completed: Patient at procedure or test/unavailable (TEE). PT will continue to f/u with pt acutely as available.    [place] 03/26/2020, 9:42 AM

## 2020-03-26 NOTE — Progress Notes (Signed)
Critical Hgb of 6.3 notified by the lab, patient left to endo for the TEE procedure, endo nurse and hospitalist made aware.

## 2020-03-26 NOTE — Progress Notes (Signed)
As per endo nurse, patient received 2 units of RBC.  Will continue to monitor.

## 2020-03-26 NOTE — Plan of Care (Signed)
  Problem: Activity: Goal: Risk for activity intolerance will decrease Outcome: Progressing   Problem: Coping: Goal: Level of anxiety will decrease Outcome: Progressing   

## 2020-03-26 NOTE — Progress Notes (Signed)
ANTICOAGULATION CONSULT NOTE Follow Up Consult  Pharmacy Consult for Heparin Indication: atrial fibrillation  Assessment: 46 YOM admitted for further workup of AKI and elevated LFTs. Pt has hx of a fib, for which he takes Eliquis at home; pharmacy was consulted to transition pt from Eliquis to IV heparin; last dose of Eliquis was taken on 4/29).  Pt is now s/p TEE>>without vegetations. Hgb dropped to 6.3 today and is getting PRBC. Dr. Marlou Porch' note stated to hold heparin for now until hgb stabilizes.   Goal of Therapy:  Heparin level: 0.3-0.5 units/ml  aPTT 66-85 sec Monitor platelets by anticoagulation protocol: Yes   Plan:  F/u with resuming heparin or apixaban  Onnie Boer, PharmD, BCIDP, AAHIVP, CPP Infectious Disease Pharmacist 03/26/2020 1:31 PM

## 2020-03-26 NOTE — Anesthesia Procedure Notes (Signed)
Procedure Name: MAC Date/Time: 03/26/2020 8:20 AM Performed by: Leonor Liv, CRNA Pre-anesthesia Checklist: Patient identified, Emergency Drugs available, Suction available, Patient being monitored and Timeout performed Patient Re-evaluated:Patient Re-evaluated prior to induction Oxygen Delivery Method: Nasal cannula Airway Equipment and Method: Bite block Placement Confirmation: positive ETCO2 Dental Injury: Teeth and Oropharynx as per pre-operative assessment

## 2020-03-26 NOTE — Progress Notes (Signed)
Physical Therapy Treatment Patient Details Name: Rishawn Mcewan MRN: [id number] DOB: June 20, 1951 Today's Date: 03/26/2020    History of Present Illness Pt is a 69 y/o male s/p ACDF C3-4 on 4/12, admitted to [facility] on 4/30 for progressive weakness, fatigue, bilateral knee pain L>R. Pt with AKI, bacteremia, L septic knee with plan for I&D bilateral knees on 5/4 pm . PMHx includes previous ACDF C4-6 (04/2019), lumbar sx (05/2019), CAD, DM, HTN, hx of CABG    PT Comments    Pt seen for re-evaluation following bilateral knee I&D on 5/4. Pt able to progress with short distance gait training this session. However, required heavy physical assistance of two for transfers and ambulation with RW. Pt limited secondary to pain, weakness and fatigue. Pt would greatly benefit from further intensive therapy services at CIR to maximize his independence with functional mobility prior to returning home with family support. Pt would continue to benefit from skilled physical therapy services at this time while admitted and after d/c to address the below listed limitations in order to improve overall safety and independence with functional mobility.   Follow Up Recommendations  CIR     Equipment Recommendations  None recommended by PT    Recommendations for Other Services       Precautions / Restrictions Precautions Precautions: Fall;Cervical Restrictions Weight Bearing Restrictions: No    Mobility  Bed Mobility Overal bed mobility: Needs Assistance Bed Mobility: Supine to Sit;Sit to Supine     Supine to sit: Supervision;HOB elevated Sit to supine: Supervision;HOB elevated   General bed mobility comments: Increased time to complete. Utilized bed rails. Pt utilized BUEs to move BLEs onto/off of bed.   Transfers Overall transfer level: Needs assistance Equipment used: Rolling walker (2 wheeled) Transfers: Sit to/from Stand Sit to Stand: Mod assist;+2 physical assistance         General  transfer comment: Cues for hand placement. Extensive assist to power up into standing from low surface.   Ambulation/Gait Ambulation/Gait assistance: Mod assist;Min assist;+2 physical assistance;+2 safety/equipment Gait Distance (Feet): 20 Feet Assistive device: Rolling walker (2 wheeled) Gait Pattern/deviations: Step-through pattern;Decreased step length - right;Decreased step length - left;Decreased stride length;Trunk flexed Gait velocity: significantly decreased   General Gait Details: pt with very slow, cautious and guarded gait; frequent cueing for safe use of RW and to maintain proximity to AD.    Stairs             Wheelchair Mobility    Modified Rankin (Stroke Patients Only)       Balance Overall balance assessment: Needs assistance Sitting-balance support: Feet supported;No upper extremity supported Sitting balance-Leahy Scale: Fair     Standing balance support: During functional activity;Bilateral upper extremity supported Standing balance-Leahy Scale: Poor Standing balance comment: heavy reliance on RW with BUEs.                             Cognition Arousal/Alertness: Awake/alert Behavior During Therapy: Flat affect Overall Cognitive Status: Impaired/Different from baseline Area of Impairment: Safety/judgement                         Safety/Judgement: Decreased awareness of safety;Decreased awareness of deficits     General Comments: Cues for safety throughout      Exercises      General Comments General comments (skin integrity, edema, etc.): Pt's wife [name] present for session. Pt's HR increased into 140s during activity. No  signs/symptoms of distress.       Pertinent Vitals/Pain Pain Assessment: Faces Faces Pain Scale: Hurts even more Pain Location: Back and bilateral knees, left knee more than right Pain Descriptors / Indicators: Cramping;Discomfort Pain Intervention(s): Monitored during session;Repositioned     Home Living                      Prior Function            PT Goals (current goals can now be found in the care plan section) Acute Rehab PT Goals PT Goal Formulation: With patient Time For Goal Achievement: 04/07/20 Potential to Achieve Goals: Good Progress towards PT goals: Progressing toward goals    Frequency    Min 3X/week      PT Plan Discharge plan needs to be updated    Co-evaluation PT/OT/SLP Co-Evaluation/Treatment: Yes Reason for Co-Treatment: For patient/therapist safety;To address functional/ADL transfers PT goals addressed during session: Mobility/safety with mobility;Proper use of DME;Balance;Strengthening/ROM OT goals addressed during session: ADL's and self-care;Strengthening/ROM      AM-PAC PT "6 Clicks" Mobility   Outcome Measure  Help needed turning from your back to your side while in a flat bed without using bedrails?: None Help needed moving from lying on your back to sitting on the side of a flat bed without using bedrails?: None Help needed moving to and from a bed to a chair (including a wheelchair)?: A Lot Help needed standing up from a chair using your arms (e.g., wheelchair or bedside chair)?: A Lot Help needed to walk in hospital room?: A Lot Help needed climbing 3-5 steps with a railing? : Total 6 Click Score: 15    End of Session Equipment Utilized During Treatment: Gait belt Activity Tolerance: Patient limited by fatigue;Patient limited by pain Patient left: in bed;with call bell/phone within reach;with family/visitor present Nurse Communication: Mobility status PT Visit Diagnosis: Muscle weakness (generalized) (M62.81);Difficulty in walking, not elsewhere classified (R26.2)     Time: [id number] PT Time Calculation (min) (ACUTE ONLY): 31 min  Charges:                        Anastasio Champion, DPT  Acute Rehabilitation Services Pager [phone] Office [place] 03/26/2020, 4:28 PM

## 2020-03-27 LAB — CBC WITH DIFFERENTIAL/PLATELET
Abs Immature Granulocytes: 1.12 10*3/uL — ABNORMAL HIGH (ref 0.00–0.07)
Basophils Absolute: 0.1 10*3/uL (ref 0.0–0.1)
Basophils Relative: 0 %
Eosinophils Absolute: 0.2 10*3/uL (ref 0.0–0.5)
Eosinophils Relative: 1 %
HCT: 28.4 % — ABNORMAL LOW (ref 39.0–52.0)
Hemoglobin: 9.6 g/dL — ABNORMAL LOW (ref 13.0–17.0)
Immature Granulocytes: 5 %
Lymphocytes Relative: 6 %
Lymphs Abs: 1.2 10*3/uL (ref 0.7–4.0)
MCH: 31.1 pg (ref 26.0–34.0)
MCHC: 33.8 g/dL (ref 30.0–36.0)
MCV: 91.9 fL (ref 80.0–100.0)
Monocytes Absolute: 0.7 10*3/uL (ref 0.1–1.0)
Monocytes Relative: 3 %
Neutro Abs: 17.8 10*3/uL — ABNORMAL HIGH (ref 1.7–7.7)
Neutrophils Relative %: 85 %
Platelets: 309 10*3/uL (ref 150–400)
RBC: 3.09 MIL/uL — ABNORMAL LOW (ref 4.22–5.81)
RDW: 15.9 % — ABNORMAL HIGH (ref 11.5–15.5)
WBC: 21 10*3/uL — ABNORMAL HIGH (ref 4.0–10.5)
nRBC: 0 % (ref 0.0–0.2)

## 2020-03-27 LAB — GLUCOSE, CAPILLARY
Glucose-Capillary: 184 mg/dL — ABNORMAL HIGH (ref 70–99)
Glucose-Capillary: 190 mg/dL — ABNORMAL HIGH (ref 70–99)
Glucose-Capillary: 179 mg/dL — ABNORMAL HIGH (ref 70–99)
Glucose-Capillary: 155 mg/dL — ABNORMAL HIGH (ref 70–99)

## 2020-03-27 LAB — COMPREHENSIVE METABOLIC PANEL
ALT: 86 U/L — ABNORMAL HIGH (ref 0–44)
AST: 24 U/L (ref 15–41)
Albumin: 2 g/dL — ABNORMAL LOW (ref 3.5–5.0)
Alkaline Phosphatase: 82 U/L (ref 38–126)
Anion gap: 8 (ref 5–15)
BUN: 21 mg/dL (ref 8–23)
CO2: 25 mmol/L (ref 22–32)
Calcium: 8.6 mg/dL — ABNORMAL LOW (ref 8.9–10.3)
Chloride: 104 mmol/L (ref 98–111)
Creatinine, Ser: 1.45 mg/dL — ABNORMAL HIGH (ref 0.61–1.24)
GFR calc Af Amer: 57 mL/min — ABNORMAL LOW (ref >60)
GFR calc non Af Amer: 49 mL/min — ABNORMAL LOW (ref >60)
Glucose, Bld: 171 mg/dL — ABNORMAL HIGH (ref 70–99)
Potassium: 4.2 mmol/L (ref 3.5–5.1)
Sodium: 137 mmol/L (ref 135–145)
Total Bilirubin: 1.7 mg/dL — ABNORMAL HIGH (ref 0.3–1.2)
Total Protein: 5.9 g/dL — ABNORMAL LOW (ref 6.5–8.1)

## 2020-03-27 LAB — TYPE AND SCREEN
ABO/RH(D): A POS
Antibody Screen: NEGATIVE
Unit division: 0
Unit division: 0

## 2020-03-27 LAB — BPAM RBC
Blood Product Expiration Date: 202105292359
Blood Product Expiration Date: 202105292359
ISSUE DATE / TIME: 202105060746
ISSUE DATE / TIME: 202105060746
Unit Type and Rh: 6200
Unit Type and Rh: 6200

## 2020-03-27 LAB — CULTURE, BODY FLUID W GRAM STAIN -BOTTLE
Culture: NO GROWTH
Special Requests: ADEQUATE

## 2020-03-27 MED ORDER — DIAZEPAM 5 MG PO TABS
ORAL_TABLET | ORAL | Status: AC
  Filled 2020-03-27: qty 1

## 2020-03-27 MED ORDER — SODIUM CHLORIDE 0.9 % IV SOLN
2.0000 g | Freq: Two times a day (BID) | INTRAVENOUS | Status: DC
  Filled 2020-03-27: qty 20

## 2020-03-27 NOTE — Progress Notes (Signed)
Inpatient Rehab Admissions:  Inpatient Rehab Consult received.  I met with patient and his family at the bedside for rehabilitation assessment and to discuss goals and expectations of an inpatient rehab admission.  Family is very interested in CIR for pt, but pt is not agreeable as of today.  We did discuss that medical workup ongoing and we could see how pt does over the weekend and f/u on Monday if he still needed CIR.   Signed: Shann Medal, PT, DPT Admissions Coordinator [phone] 03/27/20  2:03 PM

## 2020-03-27 NOTE — Progress Notes (Signed)
PROGRESS NOTE    Johnathan Murray  [id number] DOB: [birth year] DOA: 03/19/2020 PCP: Nicoletta Dress, MD    Brief Narrative:  69 y.o.malewithhistory of paroxysmal atrial fibrillation, hypertension, bioprosthetic aortic valve replacement who had undergone recent cervical discectomy and fusion about 3 weeks ago done by Dr. Trenton Gammon has been feeling weak with increasing pain in the neck and low back and poor appetite. Denies any vomiting or diarrhea denies taking any Tylenol but does take NSAIDs sometimes. Has not noticed any blood in the urine and has been urinating less last few days. Has subjective feeling of fever chills. Given the symptoms patient presents to the ER. Patient states over the last 3 weeks and surgery patient has been finding it difficult to ambulate because of the weakness of the lower extremity. Denies any incontinence of urine or bowel. Has been having more lower extremity edema more on the left side. In the ER on exam patient appears generally weak with weakness of the lower extremity. There is edema of the lower extremity on the left side. Labs show creatinine of 6.4 which is increased from 1.6 about 3 weeks ago. In addition patient's LFTs are markedly elevated at AST of 415 and ALT of 429 patient's albumin is 2.6 total bilirubin 5.5 bicarb 19 sodium 130 calcium 10.2 hemoglobin 10.4 lactic acid 1.6 WBC 12.6 platelets 341 urinalysis shows RBCs of 21-50 WBC 21-50 protein around 100 bacteria rare hyaline cast present mucus present. Patient was given fluid bolus and admitted for acute renal failure. Patient also had CT abdomen pelvis and CT cervical spine which did not show any acute. USS of the abdomen also was unremarkable. Covid test is negative  Assessment & Plan:   Principal Problem:   Enterococcal bacteremia Active Problems:   Essential hypertension   PAF (paroxysmal atrial fibrillation) (HCC)   Abnormal LFTs (liver function tests)   Cervical myelopathy  (HCC)   S/P aortic valve replacement   ARF (acute renal failure) (HCC)   Pseudogout of left knee   Septic arthritis (HCC)   History of fusion of cervical spine   Protein-calorie malnutrition, severe   AKI on CKD stage III/anion gap metabolic acidosis Improving Multifactorial from bacteremia, poor oral intake, in addition to medication induced from NSAID, Lasix and lisinopril use Baseline creatinine 1.3-1.6, on presentation 6.40 Noted urine with RBCs and protein-further work-up Per nephrology IVF now stopped Held patient's Lasix, lisinopril CT abdomen does not show any obstruction Nephrology consulted earlier, now signed off Cont to follow BMET  Sepsis likely 2/2 Enterococcus faecalis bacteremia On presentation, patient was initially hypotensive, with leukocytosis Source from possible knee infection vs recent postoperative cervical/lumbar infection BC x2 growing Enterococcus faecalis, repeat BC x2 NGTD UC no growth, Chest x-ray unremarkable Lactic acid, TSH WNL, cortisol WNL TTE showed no obvious vegetation but increased transaortic gradients across the bioprosthetic valve  Cardiology was consulted for TEE. Pt underwent TEE 5/6. Valves unremarkable, no vegetation seen. Discussed with Cardiology Fever noted over past 24hrs.  ID recs noted. Plan to complete ampicillin for at least 4 weeks with outpt f/u with ID on 04/16/20.  ID has since signed off as of 5/7  Likely LLE septic knee with ruptured Baker's cyst with possibly pseudogout Doppler showed ruptured Baker's cyst on LLE Left knee x-ray showed large joint effusion, septic joint cannot be excluded Orthopedics consulted s/p LLE joint aspiration on 03/21/2020 and RLE joint aspiration on 03/22/2020 both showed positive intracellular calcium pyrophosphate crystals, WBC over 15,000, culture grew rare E  faecalis from LLE, culture from RLE without organisms Status post steroid injection in both knees on 03/22/2020 by orthopedics Now s/p  I&D of bilateral knees on 03/24/2020, seems stable  Hyperglycemia Likely 2/2 recent steroid injection in the knee Continue with SSI, Accu-Cheks, hypoglycemic protocol Glycemic trends seem stable at this time  Transaminitis Suspect secondary to above sepsis/hypotension/shock liver INR WNL Acute hepatitis panel nonreactive Holding home amiodarone Repeat cmp in AM  Paroxysmal A. Fib/bioprosthetic aortic valve replacement Heart rate WNL Holding home amiodarone TTE as noted above Cont on tele Now off heparin secondary to transfusion dependent anemia per below  Neck and back pain History of recent cervical decompression surgery Imaging does not show anything acute as mentioned above MRI cervical spine with no evidence of infection PT/OT consulted, recs noted for CIR. Pt initially declined, however family is having pt consider it  Normocytic anemia/postop anemia Anemia panel iron 43, sats 29, ferritin 3,811 Continued on oral iron supplementation Hgb down to 6.8 this AM. Have transfused 2 units PRBC's No evidence of acute blood loss Discussed with Cardiology. Plan to hold heparin for now. If no bleeding and if hgb stable over the next 48hrs, consider resuming xarelto at that time  Hypothyroidism TSH levels WNL Continue Synthroid as pt tolerates  DVT prophylaxis: Heparin gtt Code Status: Full Family Communication: Pt in room, family at bedside  Status is: Inpatient  Remains inpatient appropriate because:Unsafe d/c plan, Inpatient level of care appropriate due to severity of illness and need to monitor for stability of hgb following blood tranfusion   Dispo: The patient is from: Home              Anticipated d/c is to: CIR              Anticipated d/c date is: 3 days              Patient currently is not medically stable to d/c.  Consultants:   ID  Orthopedic Surgery  Critical Care  Nephrology  Procedures:   TEE 5/6  Antimicrobials: Anti-infectives (From  admission, onward)   Start     Dose/Rate Route Frequency Ordered Stop   03/27/20 0900  cefTRIAXone (ROCEPHIN) 2 g in sodium chloride 0.9 % 100 mL IVPB  Status:  Discontinued     2 g 200 mL/hr over 30 Minutes Intravenous Every 12 hours 03/27/20 0828 03/27/20 0901   03/22/20 1515  cefTRIAXone (ROCEPHIN) 2 g in sodium chloride 0.9 % 100 mL IVPB  Status:  Discontinued     2 g 200 mL/hr over 30 Minutes Intravenous Every 12 hours 03/22/20 1442 03/26/20 1043   03/22/20 1000  ampicillin (OMNIPEN) 2 g in sodium chloride 0.9 % 100 mL IVPB     2 g 300 mL/hr over 20 Minutes Intravenous Every 6 hours 03/22/20 0925 04/21/20 2359   03/21/20 1800  ceFEPIme (MAXIPIME) 1 g in sodium chloride 0.9 % 100 mL IVPB  Status:  Discontinued     1 g 200 mL/hr over 30 Minutes Intravenous Every 24 hours 03/20/20 1143 03/21/20 0116   03/21/20 0200  ampicillin (OMNIPEN) 2 g in sodium chloride 0.9 % 100 mL IVPB  Status:  Discontinued     2 g 300 mL/hr over 20 Minutes Intravenous Every 8 hours 03/21/20 0116 03/22/20 0925   03/20/20 1230  ceFEPIme (MAXIPIME) 1 g in sodium chloride 0.9 % 100 mL IVPB     1 g 200 mL/hr over 30 Minutes Intravenous  Once 03/20/20 1143 03/20/20  1358      Subjective: Continues with back pains  Objective: Vitals:   03/26/20 1629 03/26/20 2048 03/27/20 0503 03/27/20 0755  BP: 135/71 126/64 110/67 (!) 137/97  Pulse: 98 86 94 (!) 107  Resp: 18 18 18 17   Temp: (!) 100.7 F (38.2 C) 98.6 F (37 C) 98.7 F (37.1 C) 98.6 F (37 C)  TempSrc: Oral  Oral Oral  SpO2: 98% 99% 98% 100%  Weight:  78.5 kg    Height:        Intake/Output Summary (Last 24 hours) at 03/27/2020 1516 Last data filed at 03/27/2020 1200 Gross per 24 hour  Intake 560 ml  Output 3125 ml  Net -2565 ml   Filed Weights   03/24/20 1551 03/26/20 0735 03/26/20 2048  Weight: 78.5 kg 78.5 kg 78.5 kg    Examination: General exam: Conversant, in no acute distress Respiratory system: normal chest rise, clear, no audible  wheezing Cardiovascular system: regular rhythm, s1-s2 Gastrointestinal system: Nondistended, nontender, pos BS Central nervous system: No seizures, no tremors Extremities: No cyanosis, no joint deformities Skin: No rashes, no pallor Psychiatry: Affect normal // no auditory hallucinations   Data Reviewed: I have personally reviewed following labs and imaging studies  CBC: Recent Labs  Lab 03/23/20 0641 03/23/20 0641 03/24/20 0410 03/25/20 0515 03/26/20 0618 03/26/20 1300 03/27/20 0737  WBC 17.3*  --  22.2* 23.1* 17.2*  --  21.0*  NEUTROABS 16.4*  --  19.7* 19.0* 16.2*  --  17.8*  HGB 7.4*   < > 7.1* 7.1* 6.3* 9.6* 9.6*  HCT 22.0*   < > 21.2* 21.4* 19.8* 28.7* 28.4*  MCV 88.4  --  89.1 91.5 92.5  --  91.9  PLT 303  --  329 420* 332  --  309   < > = values in this interval not displayed.   Basic Metabolic Panel: Recent Labs  Lab 03/23/20 0641 03/24/20 0410 03/25/20 0515 03/26/20 0618 03/27/20 0737  NA 136 134* 134* 139 137  K 4.6 4.8 4.3 4.4 4.2  CL 104 102 99 103 104  CO2 20* 21* 21* 25 25  GLUCOSE 322* 369* 307* 162* 171*  BUN 38* 44* 39* 27* 21  CREATININE 1.93* 2.01* 1.91* 1.62* 1.45*  CALCIUM 9.0 9.2 9.1 8.8* 8.6*   GFR: Estimated Creatinine Clearance: 51 mL/min (A) (by C-G formula based on SCr of 1.45 mg/dL (H)). Liver Function Tests: Recent Labs  Lab 03/23/20 0641 03/24/20 0410 03/25/20 0515 03/26/20 0618 03/27/20 0737  AST 74* 45* 42* 28 24  ALT 253* 189* 167* 109* 86*  ALKPHOS 117 103 102 87 82  BILITOT 3.8* 2.2* 2.0* 1.4* 1.7*  PROT 6.2* 6.0* 6.3* 5.7* 5.9*  ALBUMIN 1.8* 1.9* 2.1* 1.9* 2.0*   No results for input(s): LIPASE, AMYLASE in the last 168 hours. No results for input(s): AMMONIA in the last 168 hours. Coagulation Profile: No results for input(s): INR, PROTIME in the last 168 hours. Cardiac Enzymes: No results for input(s): CKTOTAL, CKMB, CKMBINDEX, TROPONINI in the last 168 hours. BNP (last 3 results) No results for input(s): PROBNP  in the last 8760 hours. HbA1C: No results for input(s): HGBA1C in the last 72 hours. CBG: Recent Labs  Lab 03/26/20 1123 03/26/20 1628 03/26/20 2048 03/27/20 0646 03/27/20 1120  GLUCAP 170* 157* 115* 184* 190*   Lipid Profile: No results for input(s): CHOL, HDL, LDLCALC, TRIG, CHOLHDL, LDLDIRECT in the last 72 hours. Thyroid Function Tests: No results for input(s): TSH, T4TOTAL, FREET4, T3FREE,  THYROIDAB in the last 72 hours. Anemia Panel: No results for input(s): VITAMINB12, FOLATE, FERRITIN, TIBC, IRON, RETICCTPCT in the last 72 hours. Sepsis Labs: Recent Labs  Lab 03/20/20 1517 03/21/20 0234 03/22/20 0102  PROCALCITON 0.57 0.53 0.44  LATICACIDVEN 1.0  --   --     Recent Results (from the past 240 hour(s))  Respiratory Panel by RT PCR (Flu A&B, Covid) - Nasopharyngeal Swab     Status: None   Collection Time: 03/19/20 10:22 PM   Specimen: Nasopharyngeal Swab  Result Value Ref Range Status   SARS Coronavirus 2 by RT PCR NEGATIVE NEGATIVE Final    Comment: (NOTE) SARS-CoV-2 target nucleic acids are NOT DETECTED. The SARS-CoV-2 RNA is generally detectable in upper respiratoy specimens during the acute phase of infection. The lowest concentration of SARS-CoV-2 viral copies this assay can detect is 131 copies/mL. A negative result does not preclude SARS-Cov-2 infection and should not be used as the sole basis for treatment or other patient management decisions. A negative result may occur with  improper specimen collection/handling, submission of specimen other than nasopharyngeal swab, presence of viral mutation(s) within the areas targeted by this assay, and inadequate number of viral copies (<131 copies/mL). A negative result must be combined with clinical observations, patient history, and epidemiological information. The expected result is Negative. Fact Sheet for Patients:  PinkCheek.be Fact Sheet for Healthcare Providers:    GravelBags.it This test is not yet ap proved or cleared by the [place] FDA and  has been authorized for detection and/or diagnosis of SARS-CoV-2 by FDA under an Emergency Use Authorization (EUA). This EUA will remain  in effect (meaning this test can be used) for the duration of the COVID-19 declaration under Section 564(b)(1) of the Act, 21 U.S.C. section 360bbb-3(b)(1), unless the authorization is terminated or revoked sooner.    Influenza A by PCR NEGATIVE NEGATIVE Final   Influenza B by PCR NEGATIVE NEGATIVE Final    Comment: (NOTE) The Xpert Xpress SARS-CoV-2/FLU/RSV assay is intended as an aid in  the diagnosis of influenza from Nasopharyngeal swab specimens and  should not be used as a sole basis for treatment. Nasal washings and  aspirates are unacceptable for Xpert Xpress SARS-CoV-2/FLU/RSV  testing. Fact Sheet for Patients: PinkCheek.be Fact Sheet for Healthcare Providers: GravelBags.it This test is not yet approved or cleared by the [place] FDA and  has been authorized for detection and/or diagnosis of SARS-CoV-2 by  FDA under an Emergency Use Authorization (EUA). This EUA will remain  in effect (meaning this test can be used) for the duration of the  Covid-19 declaration under Section 564(b)(1) of the Act, 21  U.S.C. section 360bbb-3(b)(1), unless the authorization is  terminated or revoked. Performed at [facility], [place] [address] [place], [place] [address]   Urine culture     Status: None   Collection Time: 03/19/20 10:39 PM   Specimen: Urine, Random  Result Value Ref Range Status   Specimen Description URINE, RANDOM  Final   Special Requests NONE  Final   Culture   Final    NO GROWTH Performed at [facility], [place] [address] [place], [place] [address]    Report Status 03/20/2020 FINAL  Final  Culture, blood (routine x 2)     Status: Abnormal    Collection Time: 03/20/20  7:45 AM   Specimen: BLOOD RIGHT FOREARM  Result Value Ref Range Status   Specimen Description BLOOD RIGHT FOREARM  Final   Special  Requests   Final    BOTTLES DRAWN AEROBIC ONLY Blood Culture results may not be optimal due to an inadequate volume of blood received in culture bottles   Culture  Setup Time   Final    AEROBIC BOTTLE ONLY GRAM POSITIVE COCCI CRITICAL RESULT CALLED TO, READ BACK BY AND VERIFIED WITH: K AMEND [facility] 03/21/20 0059 JDW Performed at [facility], [address] [place], [place] [address]    Culture ENTEROCOCCUS FAECALIS (A)  Final   Report Status 03/22/2020 FINAL  Final   Organism ID, Bacteria ENTEROCOCCUS FAECALIS  Final      Susceptibility   Enterococcus faecalis - MIC*    AMPICILLIN <=2 SENSITIVE Sensitive     VANCOMYCIN 1 SENSITIVE Sensitive     GENTAMICIN SYNERGY SENSITIVE Sensitive     * ENTEROCOCCUS FAECALIS  Blood Culture ID Panel (Reflexed)     Status: Abnormal   Collection Time: 03/20/20  7:45 AM  Result Value Ref Range Status   Enterococcus species DETECTED (A) NOT DETECTED Final    Comment: CRITICAL RESULT CALLED TO, READ BACK BY AND VERIFIED WITH: K AMEND PHARMD 03/21/20 0059 JDW    Vancomycin resistance NOT DETECTED NOT DETECTED Final   Listeria monocytogenes NOT DETECTED NOT DETECTED Final   Staphylococcus species NOT DETECTED NOT DETECTED Final   Staphylococcus aureus (BCID) NOT DETECTED NOT DETECTED Final   Streptococcus species NOT DETECTED NOT DETECTED Final   Streptococcus agalactiae NOT DETECTED NOT DETECTED Final   Streptococcus pneumoniae NOT DETECTED NOT DETECTED Final   Streptococcus pyogenes NOT DETECTED NOT DETECTED Final   Acinetobacter baumannii NOT DETECTED NOT DETECTED Final   Enterobacteriaceae species NOT DETECTED NOT DETECTED Final   Enterobacter cloacae complex NOT DETECTED NOT DETECTED Final   Escherichia coli NOT DETECTED NOT DETECTED Final   Klebsiella oxytoca NOT DETECTED NOT DETECTED  Final   Klebsiella pneumoniae NOT DETECTED NOT DETECTED Final   Proteus species NOT DETECTED NOT DETECTED Final   Serratia marcescens NOT DETECTED NOT DETECTED Final   Haemophilus influenzae NOT DETECTED NOT DETECTED Final   Neisseria meningitidis NOT DETECTED NOT DETECTED Final   Pseudomonas aeruginosa NOT DETECTED NOT DETECTED Final   Candida albicans NOT DETECTED NOT DETECTED Final   Candida glabrata NOT DETECTED NOT DETECTED Final   Candida krusei NOT DETECTED NOT DETECTED Final   Candida parapsilosis NOT DETECTED NOT DETECTED Final   Candida tropicalis NOT DETECTED NOT DETECTED Final    Comment: Performed at [facility], [place] [address], [place], [place] [address]  Culture, blood (routine x 2)     Status: Abnormal   Collection Time: 03/20/20  8:55 AM   Specimen: BLOOD  Result Value Ref Range Status   Specimen Description BLOOD LEFT ANTECUBITAL  Final   Special Requests   Final    BOTTLES DRAWN AEROBIC AND ANAEROBIC Blood Culture results may not be optimal due to an inadequate volume of blood received in culture bottles   Culture  Setup Time   Final    IN BOTH AEROBIC AND ANAEROBIC BOTTLES GRAM POSITIVE COCCI CRITICAL VALUE NOTED.  VALUE IS CONSISTENT WITH PREVIOUSLY REPORTED AND CALLED VALUE.    Culture (A)  Final    ENTEROCOCCUS FAECALIS SUSCEPTIBILITIES PERFORMED ON PREVIOUS CULTURE WITHIN THE LAST 5 DAYS. Performed at [facility], [place] [address] [place], [place] [address]    Report Status 03/22/2020 FINAL  Final  Body fluid culture     Status: None   Collection Time:  03/21/20  4:42 PM   Specimen: Body Fluid  Result Value Ref Range Status   Specimen Description FLUID SYNOVIAL LEFT KNEE  Final   Special Requests SYRINGE  Final   Gram Stain   Final    FEW WBC PRESENT,BOTH PMN AND MONONUCLEAR NO ORGANISMS SEEN    Culture   Final    RARE ENTEROCOCCUS FAECALIS CRITICAL RESULT CALLED TO, READ BACK BY AND VERIFIED WITH: RN A MITCHELL [id number] AT 12 AM BY  CM Performed at [facility], [place] [address], [place], [place] [address]    Report Status 03/24/2020 FINAL  Final   Organism ID, Bacteria ENTEROCOCCUS FAECALIS  Final      Susceptibility   Enterococcus faecalis - MIC*    AMPICILLIN <=2 SENSITIVE Sensitive     VANCOMYCIN 1 SENSITIVE Sensitive     GENTAMICIN SYNERGY SENSITIVE Sensitive     * RARE ENTEROCOCCUS FAECALIS  Gram stain     Status: None   Collection Time: 03/22/20  8:36 PM   Specimen: Synovium; Body Fluid  Result Value Ref Range Status   Specimen Description SYNOVIAL RIGHT KNEE  Final   Special Requests NONE  Final   Gram Stain   Final    ABUNDANT WBC PRESENT, PREDOMINANTLY PMN NO ORGANISMS SEEN Performed at [facility], [address], [place], [place] [address]    Report Status 03/22/2020 FINAL  Final  Culture, body fluid-bottle     Status: None   Collection Time: 03/22/20  8:42 PM   Specimen: Synovium  Result Value Ref Range Status   Specimen Description SYNOVIAL RIGHT KNEE  Final   Special Requests   Final    BOTTLES DRAWN AEROBIC ONLY Blood Culture adequate volume   Culture   Final    NO GROWTH 5 DAYS Performed at [facility], [place] [address], [place], [place] [address]    Report Status 03/27/2020 FINAL  Final  Culture, blood (routine x 2)     Status: None (Preliminary result)   Collection Time: 03/23/20  5:12 PM   Specimen: BLOOD  Result Value Ref Range Status   Specimen Description BLOOD LEFT ANTECUBITAL  Final   Special Requests   Final    BOTTLES DRAWN AEROBIC ONLY Blood Culture results may not be optimal due to an inadequate volume of blood received in culture bottles   Culture   Final    NO GROWTH 4 DAYS Performed at [facility], [place] [address] [place], [place] [address]    Report Status PENDING  Incomplete  Culture, blood (routine x 2)     Status: None (Preliminary result)   Collection Time: 03/23/20  5:12 PM   Specimen: BLOOD RIGHT HAND  Result Value Ref Range Status    Specimen Description BLOOD RIGHT HAND  Final   Special Requests   Final    BOTTLES DRAWN AEROBIC ONLY Blood Culture adequate volume   Culture   Final    NO GROWTH 4 DAYS Performed at [facility], [place] [address] [place],  [address]    Report Status PENDING  Incomplete  Surgical pcr screen     Status: None   Collection Time: 03/24/20  2:21 AM   Specimen: Nasal Mucosa; Nasal Swab  Result Value Ref Range Status   MRSA, PCR NEGATIVE NEGATIVE Final   Staphylococcus aureus NEGATIVE NEGATIVE Final    Comment: (NOTE) The Xpert SA Assay (FDA approved for NASAL specimens in patients 22 years of  age and older), is one component of a comprehensive surveillance program. It is not intended to diagnose infection nor to guide or monitor treatment. Performed at [facility], [place] [address], [place], [place] [address]   Culture, blood (Routine X 2) w Reflex to ID Panel     Status: None (Preliminary result)   Collection Time: 03/26/20  9:15 PM   Specimen: BLOOD  Result Value Ref Range Status   Specimen Description BLOOD RIGHT ARM  Final   Special Requests   Final    BOTTLES DRAWN AEROBIC AND ANAEROBIC Blood Culture results may not be optimal due to an excessive volume of blood received in culture bottles   Culture   Final    NO GROWTH < 12 HOURS Performed at [facility], [place] [address], [place], [place] [address]    Report Status PENDING  Incomplete  Culture, blood (Routine X 2) w Reflex to ID Panel     Status: None (Preliminary result)   Collection Time: 03/26/20  9:25 PM   Specimen: BLOOD  Result Value Ref Range Status   Specimen Description BLOOD LEFT FOREARM  Final   Special Requests   Final    BOTTLES DRAWN AEROBIC AND ANAEROBIC Blood Culture results may not be optimal due to an excessive volume of blood received in culture bottles   Culture   Final    NO GROWTH < 12 HOURS Performed at [facility], [place] [address] [place], [place] [address]    Report  Status PENDING  Incomplete     Radiology Studies: ECHO TEE  Result Date: 03/26/2020    TRANSESOPHOGEAL ECHO REPORT   Patient Name:   Johnathan Murray Date of Exam: 03/26/2020 Medical Rec #:  [id number]           Height:       70.0 in Accession #:    [id number]          Weight:       173.0 lb Date of Birth:  [birth year]           BSA:          1.963 m Patient Age:    78 years            BP:           127/92 mmHg Patient Gender: M                   HR:           76 bpm. Exam Location:  Inpatient Procedure: Transesophageal Echo, Color Doppler, Cardiac Doppler and 3D Echo Indications:     Bacteremia R78.81  History:         Patient has prior history of Echocardiogram examinations, most                  recent 03/22/2020.                  Aortic Valve: 23 mm Edwards bioprosthetic valve is present in                  the aortic position. Procedure Date: 08/16/2016.  Sonographer:     Mikki Santee RDCS (AE) Referring Phys:  [id number] Erasmo Downer FURTH Diagnosing Phys: Candee Furbish MD PROCEDURE: After discussion of the risks and benefits of a TEE, an informed consent was obtained. The transesophogeal probe was passed without difficulty through the esophogus of the patient. Sedation performed by  different physician. The patient was monitored while under deep sedation. Anesthestetic sedation was provided intravenously by Anesthesiology: 228.4mg  of Propofol. The patient's vital signs; including heart rate, blood pressure, and oxygen saturation; remained stable throughout the procedure. The patient developed no complications during the procedure. IMPRESSIONS  1. Left ventricular ejection fraction, by estimation, is 60 to 65%. The left ventricle has normal function. The left ventricle has no regional wall motion abnormalities.  2. Right ventricular systolic function is normal. The right ventricular size is normal. There is normal pulmonary artery systolic pressure.  3. No left atrial/left atrial appendage thrombus was detected.   4. The mitral valve is myxomatous. Moderate mitral valve regurgitation. No evidence of mitral stenosis.  5. The aortic valve has been repaired/replaced. Aortic valve regurgitation is not visualized. No aortic stenosis is present. There is a 23 mm Edwards bioprosthetic valve present in the aortic position. Procedure Date: 08/16/2016. Echo findings are consistent with normal structure and function of the aortic valve prosthesis.  6. The inferior vena cava is normal in size with greater than 50% respiratory variability, suggesting right atrial pressure of 3 mmHg. Conclusion(s)/Recommendation(s): Normal biventricular function without evidence of hemodynamically significant valvular heart disease. No evidence of vegetation/infective endocarditis on this transesophageal echocardiogram. FINDINGS  Left Ventricle: Left ventricular ejection fraction, by estimation, is 60 to 65%. The left ventricle has normal function. The left ventricle has no regional wall motion abnormalities. The left ventricular internal cavity size was normal in size. There is  no left ventricular hypertrophy. Right Ventricle: The right ventricular size is normal. No increase in right ventricular wall thickness. Right ventricular systolic function is normal. There is normal pulmonary artery systolic pressure. The tricuspid regurgitant velocity is 2.41 m/s, and  with an assumed right atrial pressure of 10 mmHg, the estimated right ventricular systolic pressure is [id number] mmHg. Left Atrium: Left atrial size was normal in size. No left atrial/left atrial appendage thrombus was detected. Right Atrium: Right atrial size was normal in size. Pericardium: There is no evidence of pericardial effusion. Mitral Valve: The mitral valve is myxomatous. There is mild holosystolic prolapse of multiple segments of the anterior leaflet of the mitral valve. Normal mobility of the mitral valve leaflets. Moderate mitral valve regurgitation, with eccentric posteriorly directed  jet. No evidence of mitral valve stenosis. Tricuspid Valve: The tricuspid valve is normal in structure. Tricuspid valve regurgitation is not demonstrated. No evidence of tricuspid stenosis. Aortic Valve: The aortic valve has been repaired/replaced. Aortic valve regurgitation is not visualized. No aortic stenosis is present. There is a 23 mm Edwards bioprosthetic valve present in the aortic position. Procedure Date: 08/16/2016. Echo findings are consistent with normal structure and function of the aortic valve prosthesis. Pulmonic Valve: The pulmonic valve was normal in structure. Pulmonic valve regurgitation is not visualized. No evidence of pulmonic stenosis. Aorta: The aortic root is normal in size and structure. Venous: The inferior vena cava is normal in size with greater than 50% respiratory variability, suggesting right atrial pressure of 3 mmHg. IAS/Shunts: No atrial level shunt detected by color flow Doppler.  TRICUSPID VALVE TR Peak grad:   23.2 mmHg TR Vmax:        241.00 cm/s Candee Furbish MD Electronically signed by Candee Furbish MD Signature Date/Time: 03/26/2020/10:49:18 AM    Final    [place] EKG SITE RITE  Result Date: 03/26/2020 If Site Rite image not attached, placement could not be confirmed due to current cardiac rhythm.   Scheduled Meds: . sodium chloride  Intravenous Once  . sodium chloride   Intravenous Once  . docusate sodium  100 mg Oral BID  . feeding supplement (ENSURE ENLIVE)  237 mL Oral TID BM  . insulin aspart  0-15 Units Subcutaneous TID WC  . insulin aspart  0-5 Units Subcutaneous QHS  . insulin aspart  3 Units Subcutaneous TID WC  . insulin glargine  10 Units Subcutaneous BID  . iron polysaccharides  150 mg Oral Q breakfast  . levothyroxine  25 mcg Oral Q0600  . midodrine  10 mg Oral TID WC  . senna  1 tablet Oral BID   Continuous Infusions: . ampicillin (OMNIPEN) IV 2 g (03/27/20 0949)     LOS: 7 days   Marylu Lund, MD [facility] Pager On Amion  If  7PM-7AM, please contact night-coverage 03/27/2020, 3:16 PM

## 2020-03-27 NOTE — Progress Notes (Signed)
Nutrition Follow-up  **RD working remotely**  DOCUMENTATION CODES:   Severe malnutrition in context of acute illness/injury  INTERVENTION:  Continue Ensure Enlive po TID, each supplement provides 350 kcal and 20 grams of protein   NUTRITION DIAGNOSIS:   Severe Malnutrition related to acute illness as evidenced by energy intake < or equal to 50% for > or equal to 5 days, percent weight loss(6% weight loss within 1 month).  Ongoing.  GOAL:   Patient will meet greater than or equal to 90% of their needs  Progressing.  MONITOR:   PO intake, Supplement acceptance, Labs  REASON FOR ASSESSMENT:   Consult Assessment of nutrition requirement/status, Poor PO  ASSESSMENT:   69 yo male admitted with ruptured LLE Baker's cyst, enterococcal bacteremia, possible AV endocarditis, AKI. S/P aspiration of L knee. PMH includes CKD-IIIa, PAF, HTN, biprosthetic aortic valve replacement, recent cervical discectomy and fusion.  Pt has enterococcal bacteremia and septic arthritis of his L knee.   5/4 - s/p bilateral knee arthroscopy irrigation and debridement; bilateral knee medial and lateral meisectomies 5/6 - TEE  RD unable to reach pt via phone.   Pt receiving Ensure Enlive po TID. Per RN, pt is drinking this well.   PO Intake: 50-100% x last 5 recorded meals (60% average meal intake)  UOP: 3,741ml x24 hours I/O: -1,845ml since admit  Labs: CBGs 115-190 Medications reviewed and include: Colace, Novolog, Lantus, Niferex, Senokot  NUTRITION - FOCUSED PHYSICAL EXAM:  RD unable to perform at this time, working remotely.   Diet Order:   Diet Order            Diet Carb Modified Fluid consistency: Thin; Room service appropriate? Yes  Diet effective now              EDUCATION NEEDS:   Not appropriate for education at this time  Skin:  Skin Assessment: Skin Integrity Issues: Skin Integrity Issues:: Incisions Incisions: cervical, R leg  Last BM:  5/3  Height:   Ht  Readings from Last 1 Encounters:  03/26/20 5\' 10"  (1.778 m)    Weight:   Wt Readings from Last 1 Encounters:  03/26/20 78.5 kg    Ideal Body Weight:  75.5 kg  BMI:  Body mass index is 24.82 kg/m.  Estimated Nutritional Needs:   Kcal:  1950-2150  Protein:  105-125 gm  Fluid:  >/= 2 L   Larkin Ina, MS, RD, LDN RD pager number and weekend/on-call pager number located in [place].

## 2020-03-27 NOTE — Plan of Care (Signed)
  Problem: Activity: Goal: Risk for activity intolerance will decrease Outcome: Progressing   

## 2020-03-27 NOTE — Progress Notes (Signed)
[place] for Infectious Disease  Date of Admission:  03/19/2020     Total days of antibiotics 8         ASSESSMENT:  Johnathan Murray has septic arthritis of his bilateral knees s/p I&D complicated with enterococcal bacteremia. Blood culture from 5/3 have remained without growth to date. Low grade fever of 100.7 yesterday and afebrile since. WBC count slightly increased to 21 today. No evidence of prosthetic valve endocarditis on TEE. Most likely source is his septic arthritis. Will discontinue ceftriaxone and continue current dose of ampicillin with duration of therapy for 4 weeks from the date of surgery. Continue wound care per orthopedics. Awaiting PICC line placement when able. Disposition of CIR recommended.   PLAN:  1. Continue ampicillin for 4 weeks from surgery ending on 04/21/20 2. PICC line placement when able.  3. Wound care per orthopedics.  4. OPAT orders placed pending disposition.  Diagnosis: Septic arthritis complicated by enterococcal bacteremia  Culture Result: Enterococcus faecalis  Allergies  Allergen Reactions  . Atorvastatin Other (See Comments)    Urinary retention   . Nebivolol Diarrhea    OPAT Orders Discharge antibiotics to be given via PICC line Discharge antibiotics: Ampicillin Per pharmacy protocol  Aim for Vancomycin trough 15-20 or AUC 400-550 (unless otherwise indicated) Duration: 4 weeks End Date: 04/21/20  [facility] Care Per Protocol:  Home health RN for IV administration and teaching; PICC line care and labs.    Labs weekly while on IV antibiotics: _X_ CBC with differential _X_ BMP __ CMP _X_ CRP _X_ ESR __ Vancomycin trough __ CK  __ Please pull PIC at completion of IV antibiotics _X_ Please leave PIC in place until doctor has seen patient or been notified  Fax weekly labs to [phone]  Clinic Follow Up Appt:  04/16/20 at 10:30 am with Dr. Megan Salon    Principal Problem:   Enterococcal bacteremia Active Problems:  Essential hypertension   PAF (paroxysmal atrial fibrillation) (HCC)   Abnormal LFTs (liver function tests)   Cervical myelopathy (HCC)   S/P aortic valve replacement   ARF (acute renal failure) (HCC)   Pseudogout of left knee   Septic arthritis ([place])   History of fusion of cervical spine   Protein-calorie malnutrition, severe   . sodium chloride   Intravenous Once  . sodium chloride   Intravenous Once  . docusate sodium  100 mg Oral BID  . feeding supplement (ENSURE ENLIVE)  237 mL Oral TID BM  . insulin aspart  0-15 Units Subcutaneous TID WC  . insulin aspart  0-5 Units Subcutaneous QHS  . insulin aspart  3 Units Subcutaneous TID WC  . insulin glargine  10 Units Subcutaneous BID  . iron polysaccharides  150 mg Oral Q breakfast  . levothyroxine  25 mcg Oral Q0600  . midodrine  10 mg Oral TID WC  . senna  1 tablet Oral BID    SUBJECTIVE:  Low grade fever of 100.7 yesterday but afebrile since. Having some bilateral knee pain.   Allergies  Allergen Reactions  . Atorvastatin Other (See Comments)    Urinary retention   . Nebivolol Diarrhea     Review of Systems: Review of Systems  Constitutional: Negative for chills, fever and weight loss.  Respiratory: Negative for cough, shortness of breath and wheezing.   Cardiovascular: Negative for chest pain and leg swelling.  Gastrointestinal: Negative for abdominal pain, constipation, diarrhea, nausea and vomiting.  Musculoskeletal:       Positive for  bilateral knee pain  Skin: Negative for rash.      OBJECTIVE: Vitals:   03/26/20 1629 03/26/20 2048 03/27/20 0503 03/27/20 0755  BP: 135/71 126/64 110/67 (!) 137/97  Pulse: 98 86 94 (!) 107  Resp: '18 18 18 17  '$ Temp: (!) 100.7 F (38.2 C) 98.6 F (37 C) 98.7 F (37.1 C) 98.6 F (37 C)  TempSrc: Oral  Oral Oral  SpO2: 98% 99% 98% 100%  Weight:  78.5 kg    Height:       Body mass index is 24.82 kg/m.  Physical Exam Constitutional:      General: He is not in acute  distress.    Appearance: He is well-developed.     Comments: Sitting on the side of the bed; pleasant.   Cardiovascular:     Rate and Rhythm: Normal rate and regular rhythm.     Heart sounds: Normal heart sounds.  Pulmonary:     Effort: Pulmonary effort is normal.     Breath sounds: Normal breath sounds.  Skin:    General: Skin is warm and dry.  Neurological:     Mental Status: He is alert and oriented to person, place, and time.  Psychiatric:        Behavior: Behavior normal.        Thought Content: Thought content normal.        Judgment: Judgment normal.     Lab Results Lab Results  Component Value Date   WBC 21.0 (H) 03/27/2020   HGB 9.6 (L) 03/27/2020   HCT 28.4 (L) 03/27/2020   MCV 91.9 03/27/2020   PLT 309 03/27/2020    Lab Results  Component Value Date   CREATININE 1.45 (H) 03/27/2020   BUN 21 03/27/2020   NA 137 03/27/2020   K 4.2 03/27/2020   CL 104 03/27/2020   CO2 25 03/27/2020    Lab Results  Component Value Date   ALT 86 (H) 03/27/2020   AST 24 03/27/2020   ALKPHOS 82 03/27/2020   BILITOT 1.7 (H) 03/27/2020     Microbiology: Recent Results (from the past 240 hour(s))  Respiratory Panel by RT PCR (Flu A&B, Covid) - Nasopharyngeal Swab     Status: None   Collection Time: 03/19/20 10:22 PM   Specimen: Nasopharyngeal Swab  Result Value Ref Range Status   SARS Coronavirus 2 by RT PCR NEGATIVE NEGATIVE Final    Comment: (NOTE) SARS-CoV-2 target nucleic acids are NOT DETECTED. The SARS-CoV-2 RNA is generally detectable in upper respiratoy specimens during the acute phase of infection. The lowest concentration of SARS-CoV-2 viral copies this assay can detect is 131 copies/mL. A negative result does not preclude SARS-Cov-2 infection and should not be used as the sole basis for treatment or other patient management decisions. A negative result may occur with  improper specimen collection/handling, submission of specimen other than nasopharyngeal swab,  presence of viral mutation(s) within the areas targeted by this assay, and inadequate number of viral copies (<131 copies/mL). A negative result must be combined with clinical observations, patient history, and epidemiological information. The expected result is Negative. Fact Sheet for Patients:  PinkCheek.be Fact Sheet for Healthcare Providers:  GravelBags.it This test is not yet ap proved or cleared by the [place] FDA and  has been authorized for detection and/or diagnosis of SARS-CoV-2 by FDA under an Emergency Use Authorization (EUA). This EUA will remain  in effect (meaning this test can be used) for the duration of the COVID-19 declaration under  Section 564(b)(1) of the Act, 21 U.S.C. section 360bbb-3(b)(1), unless the authorization is terminated or revoked sooner.    Influenza A by PCR NEGATIVE NEGATIVE Final   Influenza B by PCR NEGATIVE NEGATIVE Final    Comment: (NOTE) The Xpert Xpress SARS-CoV-2/FLU/RSV assay is intended as an aid in  the diagnosis of influenza from Nasopharyngeal swab specimens and  should not be used as a sole basis for treatment. Nasal washings and  aspirates are unacceptable for Xpert Xpress SARS-CoV-2/FLU/RSV  testing. Fact Sheet for Patients: PinkCheek.be Fact Sheet for Healthcare Providers: GravelBags.it This test is not yet approved or cleared by the [place] FDA and  has been authorized for detection and/or diagnosis of SARS-CoV-2 by  FDA under an Emergency Use Authorization (EUA). This EUA will remain  in effect (meaning this test can be used) for the duration of the  Covid-19 declaration under Section 564(b)(1) of the Act, 21  U.S.C. section 360bbb-3(b)(1), unless the authorization is  terminated or revoked. Performed at [facility], [place] [address] [place], [place] [address]   Urine culture     Status: None     Collection Time: 03/19/20 10:39 PM   Specimen: Urine, Random  Result Value Ref Range Status   Specimen Description URINE, RANDOM  Final   Special Requests NONE  Final   Culture   Final    NO GROWTH Performed at [facility], [place] [address] [place], [place] [address]    Report Status 03/20/2020 FINAL  Final  Culture, blood (routine x 2)     Status: Abnormal   Collection Time: 03/20/20  7:45 AM   Specimen: BLOOD RIGHT FOREARM  Result Value Ref Range Status   Specimen Description BLOOD RIGHT FOREARM  Final   Special Requests   Final    BOTTLES DRAWN AEROBIC ONLY Blood Culture results may not be optimal due to an inadequate volume of blood received in culture bottles   Culture  Setup Time   Final    AEROBIC BOTTLE ONLY GRAM POSITIVE COCCI CRITICAL RESULT CALLED TO, READ BACK BY AND VERIFIED WITH: K AMEND [facility] 03/21/20 0059 JDW Performed at [facility], [place] [address], [place], [place] [address]    Culture ENTEROCOCCUS FAECALIS (A)  Final   Report Status 03/22/2020 FINAL  Final   Organism ID, Bacteria ENTEROCOCCUS FAECALIS  Final      Susceptibility   Enterococcus faecalis - MIC*    AMPICILLIN <=2 SENSITIVE Sensitive     VANCOMYCIN 1 SENSITIVE Sensitive     GENTAMICIN SYNERGY SENSITIVE Sensitive     * ENTEROCOCCUS FAECALIS  Blood Culture ID Panel (Reflexed)     Status: Abnormal   Collection Time: 03/20/20  7:45 AM  Result Value Ref Range Status   Enterococcus species DETECTED (A) NOT DETECTED Final    Comment: CRITICAL RESULT CALLED TO, READ BACK BY AND VERIFIED WITH: K AMEND PHARMD 03/21/20 0059 JDW    Vancomycin resistance NOT DETECTED NOT DETECTED Final   Listeria monocytogenes NOT DETECTED NOT DETECTED Final   Staphylococcus species NOT DETECTED NOT DETECTED Final   Staphylococcus aureus (BCID) NOT DETECTED NOT DETECTED Final   Streptococcus species NOT DETECTED NOT DETECTED Final   Streptococcus agalactiae NOT DETECTED NOT DETECTED Final   Streptococcus  pneumoniae NOT DETECTED NOT DETECTED Final   Streptococcus pyogenes NOT DETECTED NOT DETECTED Final   Acinetobacter baumannii NOT DETECTED NOT DETECTED Final   Enterobacteriaceae species NOT DETECTED NOT DETECTED Final   Enterobacter  cloacae complex NOT DETECTED NOT DETECTED Final   Escherichia coli NOT DETECTED NOT DETECTED Final   Klebsiella oxytoca NOT DETECTED NOT DETECTED Final   Klebsiella pneumoniae NOT DETECTED NOT DETECTED Final   Proteus species NOT DETECTED NOT DETECTED Final   Serratia marcescens NOT DETECTED NOT DETECTED Final   Haemophilus influenzae NOT DETECTED NOT DETECTED Final   Neisseria meningitidis NOT DETECTED NOT DETECTED Final   Pseudomonas aeruginosa NOT DETECTED NOT DETECTED Final   Candida albicans NOT DETECTED NOT DETECTED Final   Candida glabrata NOT DETECTED NOT DETECTED Final   Candida krusei NOT DETECTED NOT DETECTED Final   Candida parapsilosis NOT DETECTED NOT DETECTED Final   Candida tropicalis NOT DETECTED NOT DETECTED Final    Comment: Performed at [facility], [place] [address], [place], [place] [address]  Culture, blood (routine x 2)     Status: Abnormal   Collection Time: 03/20/20  8:55 AM   Specimen: BLOOD  Result Value Ref Range Status   Specimen Description BLOOD LEFT ANTECUBITAL  Final   Special Requests   Final    BOTTLES DRAWN AEROBIC AND ANAEROBIC Blood Culture results may not be optimal due to an inadequate volume of blood received in culture bottles   Culture  Setup Time   Final    IN BOTH AEROBIC AND ANAEROBIC BOTTLES GRAM POSITIVE COCCI CRITICAL VALUE NOTED.  VALUE IS CONSISTENT WITH PREVIOUSLY REPORTED AND CALLED VALUE.    Culture (A)  Final    ENTEROCOCCUS FAECALIS SUSCEPTIBILITIES PERFORMED ON PREVIOUS CULTURE WITHIN THE LAST 5 DAYS. Performed at [facility], [place] [address], [place], [place] [address]    Report Status 03/22/2020 FINAL  Final  Body fluid culture     Status: None   Collection Time: 03/21/20  4:42 PM     Specimen: Body Fluid  Result Value Ref Range Status   Specimen Description FLUID SYNOVIAL LEFT KNEE  Final   Special Requests SYRINGE  Final   Gram Stain   Final    FEW WBC PRESENT,BOTH PMN AND MONONUCLEAR NO ORGANISMS SEEN    Culture   Final    RARE ENTEROCOCCUS FAECALIS CRITICAL RESULT CALLED TO, READ BACK BY AND VERIFIED WITH: RN A MITCHELL [id number] AT 20 AM BY CM Performed at [facility], [place] [address] [place], [place] [address]    Report Status 03/24/2020 FINAL  Final   Organism ID, Bacteria ENTEROCOCCUS FAECALIS  Final      Susceptibility   Enterococcus faecalis - MIC*    AMPICILLIN <=2 SENSITIVE Sensitive     VANCOMYCIN 1 SENSITIVE Sensitive     GENTAMICIN SYNERGY SENSITIVE Sensitive     * RARE ENTEROCOCCUS FAECALIS  Gram stain     Status: None   Collection Time: 03/22/20  8:36 PM   Specimen: Synovium; Body Fluid  Result Value Ref Range Status   Specimen Description SYNOVIAL RIGHT KNEE  Final   Special Requests NONE  Final   Gram Stain   Final    ABUNDANT WBC PRESENT, PREDOMINANTLY PMN NO ORGANISMS SEEN Performed at [facility], [address] [place], [place] [address]    Report Status 03/22/2020 FINAL  Final  Culture, body fluid-bottle     Status: None   Collection Time: 03/22/20  8:42 PM   Specimen: Synovium  Result Value Ref Range Status   Specimen Description SYNOVIAL RIGHT KNEE  Final   Special Requests   Final    BOTTLES DRAWN AEROBIC ONLY  Blood Culture adequate volume   Culture   Final    NO GROWTH 5 DAYS Performed at [facility], [place] [address] [place], [place] [address]    Report Status 03/27/2020 FINAL  Final  Culture, blood (routine x 2)     Status: None (Preliminary result)   Collection Time: 03/23/20  5:12 PM   Specimen: BLOOD  Result Value Ref Range Status   Specimen Description BLOOD LEFT ANTECUBITAL  Final   Special Requests   Final    BOTTLES DRAWN AEROBIC ONLY Blood Culture results may not be optimal due to an  inadequate volume of blood received in culture bottles   Culture   Final    NO GROWTH 4 DAYS Performed at [facility], [place] [address] [place], [place] [address]    Report Status PENDING  Incomplete  Culture, blood (routine x 2)     Status: None (Preliminary result)   Collection Time: 03/23/20  5:12 PM   Specimen: BLOOD RIGHT HAND  Result Value Ref Range Status   Specimen Description BLOOD RIGHT HAND  Final   Special Requests   Final    BOTTLES DRAWN AEROBIC ONLY Blood Culture adequate volume   Culture   Final    NO GROWTH 4 DAYS Performed at [facility], [place] [address], [place], [place] [address]    Report Status PENDING  Incomplete  Surgical pcr screen     Status: None   Collection Time: 03/24/20  2:21 AM   Specimen: Nasal Mucosa; Nasal Swab  Result Value Ref Range Status   MRSA, PCR NEGATIVE NEGATIVE Final   Staphylococcus aureus NEGATIVE NEGATIVE Final    Comment: (NOTE) The Xpert SA Assay (FDA approved for NASAL specimens in patients 39 years of age and older), is one component of a comprehensive surveillance program. It is not intended to diagnose infection nor to guide or monitor treatment. Performed at [facility], [place] [address] [place], [place] [address]   Culture, blood (Routine X 2) w Reflex to ID Panel     Status: None (Preliminary result)   Collection Time: 03/26/20  9:15 PM   Specimen: BLOOD  Result Value Ref Range Status   Specimen Description BLOOD RIGHT ARM  Final   Special Requests   Final    BOTTLES DRAWN AEROBIC AND ANAEROBIC Blood Culture results may not be optimal due to an excessive volume of blood received in culture bottles   Culture   Final    NO GROWTH < 12 HOURS Performed at [facility], [place] [address] [place], [place] [address]    Report Status PENDING  Incomplete  Culture, blood (Routine X 2) w Reflex to ID Panel     Status: None (Preliminary result)   Collection Time: 03/26/20  9:25 PM   Specimen: BLOOD    Result Value Ref Range Status   Specimen Description BLOOD LEFT FOREARM  Final   Special Requests   Final    BOTTLES DRAWN AEROBIC AND ANAEROBIC Blood Culture results may not be optimal due to an excessive volume of blood received in culture bottles   Culture   Final    NO GROWTH < 12 HOURS Performed at [facility], [place] [address] [place], [place] [address]    Report Status PENDING  Incomplete     Terri Piedra, [place] [facility]  03/27/2020  1:57 PM

## 2020-03-27 NOTE — Progress Notes (Signed)
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 Baxter Flattery.  Ceftriaxone 2g IV q12  Onnie Boer, PharmD, [place], AAHIVP, CPP Infectious Disease Pharmacist 03/27/2020 8:32 AM

## 2020-03-27 NOTE — Progress Notes (Signed)
PHARMACY CONSULT NOTE FOR:  OUTPATIENT  PARENTERAL ANTIBIOTIC THERAPY (OPAT)  Indication: Enterococcus faecalis bacteremia/septic arthritis  Regimen: Ampicillin 8 gm every 24 hours as a continuous infusion End date: 04/21/20  IV antibiotic discharge orders are pended. To discharging provider:  please sign these orders via discharge navigator,  Select New Orders & click on the button choice - Manage This Unsigned Work.     Thank you for allowing pharmacy to be a part of this patient's care.  Jimmy Footman, PharmD, BCPS, BCIDP Infectious Diseases Clinical Pharmacist Phone: [phone] 03/27/2020, 2:18 PM

## 2020-03-27 NOTE — Plan of Care (Signed)
°  Problem: Coping: °Goal: Level of anxiety will decrease °Outcome: Progressing °  °

## 2020-03-28 LAB — CBC WITH DIFFERENTIAL/PLATELET
Abs Immature Granulocytes: 0.53 10*3/uL — ABNORMAL HIGH (ref 0.00–0.07)
Basophils Absolute: 0 10*3/uL (ref 0.0–0.1)
Basophils Relative: 0 %
Eosinophils Absolute: 0.3 10*3/uL (ref 0.0–0.5)
Eosinophils Relative: 2 %
HCT: 27 % — ABNORMAL LOW (ref 39.0–52.0)
Hemoglobin: 8.8 g/dL — ABNORMAL LOW (ref 13.0–17.0)
Immature Granulocytes: 3 %
Lymphocytes Relative: 6 %
Lymphs Abs: 1.2 10*3/uL (ref 0.7–4.0)
MCH: 30.3 pg (ref 26.0–34.0)
MCHC: 32.6 g/dL (ref 30.0–36.0)
MCV: 93.1 fL (ref 80.0–100.0)
Monocytes Absolute: 0.7 10*3/uL (ref 0.1–1.0)
Monocytes Relative: 4 %
Neutro Abs: 15.9 10*3/uL — ABNORMAL HIGH (ref 1.7–7.7)
Neutrophils Relative %: 85 %
Platelets: 270 10*3/uL (ref 150–400)
RBC: 2.9 MIL/uL — ABNORMAL LOW (ref 4.22–5.81)
RDW: 16 % — ABNORMAL HIGH (ref 11.5–15.5)
WBC: 18.6 10*3/uL — ABNORMAL HIGH (ref 4.0–10.5)
nRBC: 0 % (ref 0.0–0.2)

## 2020-03-28 LAB — CULTURE, BLOOD (ROUTINE X 2)
Culture: NO GROWTH
Culture: NO GROWTH
Special Requests: ADEQUATE

## 2020-03-28 LAB — COMPREHENSIVE METABOLIC PANEL
ALT: 73 U/L — ABNORMAL HIGH (ref 0–44)
AST: 25 U/L (ref 15–41)
Albumin: 1.9 g/dL — ABNORMAL LOW (ref 3.5–5.0)
Alkaline Phosphatase: 75 U/L (ref 38–126)
Anion gap: 10 (ref 5–15)
BUN: 17 mg/dL (ref 8–23)
CO2: 25 mmol/L (ref 22–32)
Calcium: 8.7 mg/dL — ABNORMAL LOW (ref 8.9–10.3)
Chloride: 101 mmol/L (ref 98–111)
Creatinine, Ser: 1.45 mg/dL — ABNORMAL HIGH (ref 0.61–1.24)
GFR calc Af Amer: 57 mL/min — ABNORMAL LOW (ref >60)
GFR calc non Af Amer: 49 mL/min — ABNORMAL LOW (ref >60)
Glucose, Bld: 138 mg/dL — ABNORMAL HIGH (ref 70–99)
Potassium: 3.8 mmol/L (ref 3.5–5.1)
Sodium: 136 mmol/L (ref 135–145)
Total Bilirubin: 1.8 mg/dL — ABNORMAL HIGH (ref 0.3–1.2)
Total Protein: 5.7 g/dL — ABNORMAL LOW (ref 6.5–8.1)

## 2020-03-28 LAB — GLUCOSE, CAPILLARY
Glucose-Capillary: 162 mg/dL — ABNORMAL HIGH (ref 70–99)
Glucose-Capillary: 221 mg/dL — ABNORMAL HIGH (ref 70–99)
Glucose-Capillary: 93 mg/dL (ref 70–99)
Glucose-Capillary: 118 mg/dL — ABNORMAL HIGH (ref 70–99)

## 2020-03-28 MED ORDER — APIXABAN 5 MG PO TABS
5.0000 mg | ORAL_TABLET | Freq: Two times a day (BID) | ORAL | Status: DC
  Administered 2020-03-28 – 2020-04-03 (×13): 5 mg via ORAL
  Filled 2020-03-28 (×13): qty 1

## 2020-03-28 NOTE — Progress Notes (Signed)
PROGRESS NOTE    Johnathan Murray  [id number] DOB: 06-07-1951 DOA: 03/19/2020 PCP: Nicoletta Dress, MD    Brief Narrative:  69 y.o.malewithhistory of paroxysmal atrial fibrillation, hypertension, bioprosthetic aortic valve replacement who had undergone recent cervical discectomy and fusion about 3 weeks ago done by Dr. Trenton Gammon has been feeling weak with increasing pain in the neck and low back and poor appetite. Denies any vomiting or diarrhea denies taking any Tylenol but does take NSAIDs sometimes. Has not noticed any blood in the urine and has been urinating less last few days. Has subjective feeling of fever chills. Given the symptoms patient presents to the ER. Patient states over the last 3 weeks and surgery patient has been finding it difficult to ambulate because of the weakness of the lower extremity. Denies any incontinence of urine or bowel. Has been having more lower extremity edema more on the left side. In the ER on exam patient appears generally weak with weakness of the lower extremity. There is edema of the lower extremity on the left side. Labs show creatinine of 6.4 which is increased from 1.6 about 3 weeks ago. In addition patient's LFTs are markedly elevated at AST of 415 and ALT of 429 patient's albumin is 2.6 total bilirubin 5.5 bicarb 19 sodium 130 calcium 10.2 hemoglobin 10.4 lactic acid 1.6 WBC 12.6 platelets 341 urinalysis shows RBCs of 21-50 WBC 21-50 protein around 100 bacteria rare hyaline cast present mucus present. Patient was given fluid bolus and admitted for acute renal failure. Patient also had CT abdomen pelvis and CT cervical spine which did not show any acute. USS of the abdomen also was unremarkable. Covid test is negative  Assessment & Plan:   Principal Problem:   Enterococcal bacteremia Active Problems:   Essential hypertension   PAF (paroxysmal atrial fibrillation) (HCC)   Abnormal LFTs (liver function tests)   Cervical myelopathy  (HCC)   S/P aortic valve replacement   ARF (acute renal failure) (HCC)   Pseudogout of left knee   Septic arthritis (HCC)   History of fusion of cervical spine   Protein-calorie malnutrition, severe   AKI on CKD stage III/anion gap metabolic acidosis Improving Multifactorial from bacteremia, poor oral intake, in addition to medication induced from NSAID, Lasix and lisinopril use Baseline creatinine 1.3-1.6, on presentation 6.40 Noted urine with RBCs and protein-further work-up Per nephrology IVF now stopped Held patient's Lasix, lisinopril CT abdomen does not show any obstruction Nephrology consulted earlier, since signed off Cont to follow BMET  Sepsis likely 2/2 Enterococcus faecalis bacteremia On presentation, patient was initially hypotensive, with leukocytosis Source from possible knee infection vs recent postoperative cervical/lumbar infection BC x2 growing Enterococcus faecalis, repeat BC x2 NGTD UC no growth, Chest x-ray unremarkable Lactic acid, TSH WNL, cortisol WNL TTE showed no obvious vegetation but increased transaortic gradients across the bioprosthetic valve  Cardiology was consulted for TEE. Pt underwent TEE 5/6. Valves unremarkable, no vegetation seen. Discussed with Cardiology Fever noted over past 24hrs.  ID recs noted. Plan to complete ampicillin for at least 4 weeks with outpt f/u with ID on 04/16/20.  ID has signed off on 5/7  Likely LLE septic knee with ruptured Baker's cyst with possibly pseudogout Doppler showed ruptured Baker's cyst on LLE Left knee x-ray showed large joint effusion, septic joint cannot be excluded Orthopedics consulted s/p LLE joint aspiration on 03/21/2020 and RLE joint aspiration on 03/22/2020 both showed positive intracellular calcium pyrophosphate crystals, WBC over 15,000, culture grew rare E faecalis from  LLE, culture from RLE without organisms Status post steroid injection in both knees on 03/22/2020 by orthopedics Now s/p I&D of  bilateral knees on 03/24/2020, stable at this time Cont PT  Hyperglycemia Likely 2/2 recent steroid injection in the knee Continue with SSI, Accu-Cheks, hypoglycemic protocol Glycemic trends thus far stable at this time  Transaminitis Suspect secondary to above sepsis/hypotension/shock liver INR WNL Acute hepatitis panel nonreactive Home amiodarone currently on hold LFT's are trending towards normal Repeat cmp in AM  Paroxysmal A. Fib/bioprosthetic aortic valve replacement Heart rate WNL Holding home amiodarone TTE as noted above Cont on tele Hgb stable x48hrs s/p recent blood transfusion. Will resume home eliquis Repeat cbc in AM and monitor closely for bleeding  Neck and back pain History of recent cervical decompression surgery Imaging does not show anything acute as mentioned above MRI cervical spine with no evidence of infection PT/OT consulted, recs noted for CIR. Pt initially declined, however family is having pt consider it  Normocytic anemia/postop anemia Anemia panel iron 43, sats 29, ferritin 3,811 Continued on oral iron supplementation Hgb down to 6.8 recently. Have transfused 2 units PRBC's No evidence of acute blood loss Hgb has remained stable since PRBC transfusion. Will resume home eliquis and monitor closely for bleeding Repeat cbc in AM  Hypothyroidism TSH levels WNL Continue Synthroid as pt tolerates  DVT prophylaxis: Eliquis Code Status: Full Family Communication: Pt in room, family not at bedside  Status is: Inpatient  Remains inpatient appropriate because:Unsafe d/c plan, Inpatient level of care appropriate due to severity of illness and need to monitor for stability of hgb following blood tranfusion   Dispo: The patient is from: Home              Anticipated d/c is to: CIR              Anticipated d/c date is: 3 days              Patient currently is not medically stable to d/c.  Consultants:   ID  Orthopedic Surgery  Critical  Care  Nephrology  Procedures:   TEE 5/6  Antimicrobials: Anti-infectives (From admission, onward)   Start     Dose/Rate Route Frequency Ordered Stop   03/27/20 0900  cefTRIAXone (ROCEPHIN) 2 g in sodium chloride 0.9 % 100 mL IVPB  Status:  Discontinued     2 g 200 mL/hr over 30 Minutes Intravenous Every 12 hours 03/27/20 0828 03/27/20 0901   03/22/20 1515  cefTRIAXone (ROCEPHIN) 2 g in sodium chloride 0.9 % 100 mL IVPB  Status:  Discontinued     2 g 200 mL/hr over 30 Minutes Intravenous Every 12 hours 03/22/20 1442 03/26/20 1043   03/22/20 1000  ampicillin (OMNIPEN) 2 g in sodium chloride 0.9 % 100 mL IVPB     2 g 300 mL/hr over 20 Minutes Intravenous Every 6 hours 03/22/20 0925 04/21/20 2359   03/21/20 1800  ceFEPIme (MAXIPIME) 1 g in sodium chloride 0.9 % 100 mL IVPB  Status:  Discontinued     1 g 200 mL/hr over 30 Minutes Intravenous Every 24 hours 03/20/20 1143 03/21/20 0116   03/21/20 0200  ampicillin (OMNIPEN) 2 g in sodium chloride 0.9 % 100 mL IVPB  Status:  Discontinued     2 g 300 mL/hr over 20 Minutes Intravenous Every 8 hours 03/21/20 0116 03/22/20 0925   03/20/20 1230  ceFEPIme (MAXIPIME) 1 g in sodium chloride 0.9 % 100 mL IVPB  1 g 200 mL/hr over 30 Minutes Intravenous  Once 03/20/20 1143 03/20/20 1358      Subjective: Hoping to go home soon, but is agreeable to CIR  Objective: Vitals:   03/27/20 1627 03/27/20 2026 03/28/20 0459 03/28/20 0906  BP: (!) 150/81 (!) 143/73 (!) 145/81 127/60  Pulse: 90 74 73 92  Resp: 18 17 18 16   Temp: 100.1 F (37.8 C) 99 F (37.2 C) 99 F (37.2 C) 99.2 F (37.3 C)  TempSrc: Oral Oral Oral Oral  SpO2: 98% 99% 99% 99%  Weight:      Height:        Intake/Output Summary (Last 24 hours) at 03/28/2020 1343 Last data filed at 03/28/2020 0900 Gross per 24 hour  Intake 880 ml  Output 2700 ml  Net -1820 ml   Filed Weights   03/24/20 1551 03/26/20 0735 03/26/20 2048  Weight: 78.5 kg 78.5 kg 78.5 kg     Examination: General exam: Awake, laying in bed, in nad Respiratory system: Normal respiratory effort, no wheezing Cardiovascular system: regular rate, s1, s2 Gastrointestinal system: Soft, nondistended, positive BS Central nervous system: CN2-12 grossly intact, strength intact Extremities: Perfused, no clubbing, post-surgical dressings over knees Skin: Normal skin turgor, no notable skin lesions seen Psychiatry: Mood normal // no visual hallucinations   Data Reviewed: I have personally reviewed following labs and imaging studies  CBC: Recent Labs  Lab 03/24/20 0410 03/24/20 0410 03/25/20 0515 03/26/20 0618 03/26/20 1300 03/27/20 0737 03/28/20 0532  WBC 22.2*  --  23.1* 17.2*  --  21.0* 18.6*  NEUTROABS 19.7*  --  19.0* 16.2*  --  17.8* 15.9*  HGB 7.1*   < > 7.1* 6.3* 9.6* 9.6* 8.8*  HCT 21.2*   < > 21.4* 19.8* 28.7* 28.4* 27.0*  MCV 89.1  --  91.5 92.5  --  91.9 93.1  PLT 329  --  420* 332  --  309 270   < > = values in this interval not displayed.   Basic Metabolic Panel: Recent Labs  Lab 03/24/20 0410 03/25/20 0515 03/26/20 0618 03/27/20 0737 03/28/20 0532  NA 134* 134* 139 137 136  K 4.8 4.3 4.4 4.2 3.8  CL 102 99 103 104 101  CO2 21* 21* 25 25 25   GLUCOSE 369* 307* 162* 171* 138*  BUN 44* 39* 27* 21 17  CREATININE 2.01* 1.91* 1.62* 1.45* 1.45*  CALCIUM 9.2 9.1 8.8* 8.6* 8.7*   GFR: Estimated Creatinine Clearance: 51 mL/min (A) (by C-G formula based on SCr of 1.45 mg/dL (H)). Liver Function Tests: Recent Labs  Lab 03/24/20 0410 03/25/20 0515 03/26/20 0618 03/27/20 0737 03/28/20 0532  AST 45* 42* 28 24 25   ALT 189* 167* 109* 86* 73*  ALKPHOS 103 102 87 82 75  BILITOT 2.2* 2.0* 1.4* 1.7* 1.8*  PROT 6.0* 6.3* 5.7* 5.9* 5.7*  ALBUMIN 1.9* 2.1* 1.9* 2.0* 1.9*   No results for input(s): LIPASE, AMYLASE in the last 168 hours. No results for input(s): AMMONIA in the last 168 hours. Coagulation Profile: No results for input(s): INR, PROTIME in the  last 168 hours. Cardiac Enzymes: No results for input(s): CKTOTAL, CKMB, CKMBINDEX, TROPONINI in the last 168 hours. BNP (last 3 results) No results for input(s): PROBNP in the last 8760 hours. HbA1C: No results for input(s): HGBA1C in the last 72 hours. CBG: Recent Labs  Lab 03/27/20 1120 03/27/20 1628 03/27/20 2142 03/28/20 0724 03/28/20 1122  GLUCAP 190* 179* 155* 162* 221*   Lipid Profile: No  results for input(s): CHOL, HDL, LDLCALC, TRIG, CHOLHDL, LDLDIRECT in the last 72 hours. Thyroid Function Tests: No results for input(s): TSH, T4TOTAL, FREET4, T3FREE, THYROIDAB in the last 72 hours. Anemia Panel: No results for input(s): VITAMINB12, FOLATE, FERRITIN, TIBC, IRON, RETICCTPCT in the last 72 hours. Sepsis Labs: Recent Labs  Lab 03/22/20 0102  PROCALCITON 0.44    Recent Results (from the past 240 hour(s))  Respiratory Panel by RT PCR (Flu A&B, Covid) - Nasopharyngeal Swab     Status: None   Collection Time: 03/19/20 10:22 PM   Specimen: Nasopharyngeal Swab  Result Value Ref Range Status   SARS Coronavirus 2 by RT PCR NEGATIVE NEGATIVE Final    Comment: (NOTE) SARS-CoV-2 target nucleic acids are NOT DETECTED. The SARS-CoV-2 RNA is generally detectable in upper respiratoy specimens during the acute phase of infection. The lowest concentration of SARS-CoV-2 viral copies this assay can detect is 131 copies/mL. A negative result does not preclude SARS-Cov-2 infection and should not be used as the sole basis for treatment or other patient management decisions. A negative result may occur with  improper specimen collection/handling, submission of specimen other than nasopharyngeal swab, presence of viral mutation(s) within the areas targeted by this assay, and inadequate number of viral copies (<131 copies/mL). A negative result must be combined with clinical observations, patient history, and epidemiological information. The expected result is Negative. Fact Sheet for  Patients:  PinkCheek.be Fact Sheet for Healthcare Providers:  GravelBags.it This test is not yet ap proved or cleared by the [place] FDA and  has been authorized for detection and/or diagnosis of SARS-CoV-2 by FDA under an Emergency Use Authorization (EUA). This EUA will remain  in effect (meaning this test can be used) for the duration of the COVID-19 declaration under Section 564(b)(1) of the Act, 21 U.S.C. section 360bbb-3(b)(1), unless the authorization is terminated or revoked sooner.    Influenza A by PCR NEGATIVE NEGATIVE Final   Influenza B by PCR NEGATIVE NEGATIVE Final    Comment: (NOTE) The Xpert Xpress SARS-CoV-2/FLU/RSV assay is intended as an aid in  the diagnosis of influenza from Nasopharyngeal swab specimens and  should not be used as a sole basis for treatment. Nasal washings and  aspirates are unacceptable for Xpert Xpress SARS-CoV-2/FLU/RSV  testing. Fact Sheet for Patients: PinkCheek.be Fact Sheet for Healthcare Providers: GravelBags.it This test is not yet approved or cleared by the [place] FDA and  has been authorized for detection and/or diagnosis of SARS-CoV-2 by  FDA under an Emergency Use Authorization (EUA). This EUA will remain  in effect (meaning this test can be used) for the duration of the  Covid-19 declaration under Section 564(b)(1) of the Act, 21  U.S.C. section 360bbb-3(b)(1), unless the authorization is  terminated or revoked. Performed at [facility], [place] [address] [place], [place] [address]   Urine culture     Status: None   Collection Time: 03/19/20 10:39 PM   Specimen: Urine, Random  Result Value Ref Range Status   Specimen Description URINE, RANDOM  Final   Special Requests NONE  Final   Culture   Final    NO GROWTH Performed at [facility], [place] [address], [place], [place] [address]     Report Status 03/20/2020 FINAL  Final  Culture, blood (routine x 2)     Status: Abnormal   Collection Time: 03/20/20  7:45 AM   Specimen: BLOOD RIGHT FOREARM  Result Value Ref Range Status   Specimen  Description BLOOD RIGHT FOREARM  Final   Special Requests   Final    BOTTLES DRAWN AEROBIC ONLY Blood Culture results may not be optimal due to an inadequate volume of blood received in culture bottles   Culture  Setup Time   Final    AEROBIC BOTTLE ONLY GRAM POSITIVE COCCI CRITICAL RESULT CALLED TO, READ BACK BY AND VERIFIED WITH: K AMEND [facility] 03/21/20 0059 JDW Performed at [facility], [place] [address], [place], [place] [address]    Culture ENTEROCOCCUS FAECALIS (A)  Final   Report Status 03/22/2020 FINAL  Final   Organism ID, Bacteria ENTEROCOCCUS FAECALIS  Final      Susceptibility   Enterococcus faecalis - MIC*    AMPICILLIN <=2 SENSITIVE Sensitive     VANCOMYCIN 1 SENSITIVE Sensitive     GENTAMICIN SYNERGY SENSITIVE Sensitive     * ENTEROCOCCUS FAECALIS  Blood Culture ID Panel (Reflexed)     Status: Abnormal   Collection Time: 03/20/20  7:45 AM  Result Value Ref Range Status   Enterococcus species DETECTED (A) NOT DETECTED Final    Comment: CRITICAL RESULT CALLED TO, READ BACK BY AND VERIFIED WITH: K AMEND PHARMD 03/21/20 0059 JDW    Vancomycin resistance NOT DETECTED NOT DETECTED Final   Listeria monocytogenes NOT DETECTED NOT DETECTED Final   Staphylococcus species NOT DETECTED NOT DETECTED Final   Staphylococcus aureus (BCID) NOT DETECTED NOT DETECTED Final   Streptococcus species NOT DETECTED NOT DETECTED Final   Streptococcus agalactiae NOT DETECTED NOT DETECTED Final   Streptococcus pneumoniae NOT DETECTED NOT DETECTED Final   Streptococcus pyogenes NOT DETECTED NOT DETECTED Final   Acinetobacter baumannii NOT DETECTED NOT DETECTED Final   Enterobacteriaceae species NOT DETECTED NOT DETECTED Final   Enterobacter cloacae complex NOT DETECTED NOT DETECTED Final    Escherichia coli NOT DETECTED NOT DETECTED Final   Klebsiella oxytoca NOT DETECTED NOT DETECTED Final   Klebsiella pneumoniae NOT DETECTED NOT DETECTED Final   Proteus species NOT DETECTED NOT DETECTED Final   Serratia marcescens NOT DETECTED NOT DETECTED Final   Haemophilus influenzae NOT DETECTED NOT DETECTED Final   Neisseria meningitidis NOT DETECTED NOT DETECTED Final   Pseudomonas aeruginosa NOT DETECTED NOT DETECTED Final   Candida albicans NOT DETECTED NOT DETECTED Final   Candida glabrata NOT DETECTED NOT DETECTED Final   Candida krusei NOT DETECTED NOT DETECTED Final   Candida parapsilosis NOT DETECTED NOT DETECTED Final   Candida tropicalis NOT DETECTED NOT DETECTED Final    Comment: Performed at [facility], [place] [address] [place], [place] [address]  Culture, blood (routine x 2)     Status: Abnormal   Collection Time: 03/20/20  8:55 AM   Specimen: BLOOD  Result Value Ref Range Status   Specimen Description BLOOD LEFT ANTECUBITAL  Final   Special Requests   Final    BOTTLES DRAWN AEROBIC AND ANAEROBIC Blood Culture results may not be optimal due to an inadequate volume of blood received in culture bottles   Culture  Setup Time   Final    IN BOTH AEROBIC AND ANAEROBIC BOTTLES GRAM POSITIVE COCCI CRITICAL VALUE NOTED.  VALUE IS CONSISTENT WITH PREVIOUSLY REPORTED AND CALLED VALUE.    Culture (A)  Final    ENTEROCOCCUS FAECALIS SUSCEPTIBILITIES PERFORMED ON PREVIOUS CULTURE WITHIN THE LAST 5 DAYS. Performed at [facility], [place] [address], [place], [place] [address]    Report Status 03/22/2020 FINAL  Final  Body fluid culture  Status: None   Collection Time: 03/21/20  4:42 PM   Specimen: Body Fluid  Result Value Ref Range Status   Specimen Description FLUID SYNOVIAL LEFT KNEE  Final   Special Requests SYRINGE  Final   Gram Stain   Final    FEW WBC PRESENT,BOTH PMN AND MONONUCLEAR NO ORGANISMS SEEN    Culture   Final    RARE ENTEROCOCCUS  FAECALIS CRITICAL RESULT CALLED TO, READ BACK BY AND VERIFIED WITH: RN A MITCHELL [id number] AT 56 AM BY CM Performed at [facility], [place] [address] [place], [place] [address]    Report Status 03/24/2020 FINAL  Final   Organism ID, Bacteria ENTEROCOCCUS FAECALIS  Final      Susceptibility   Enterococcus faecalis - MIC*    AMPICILLIN <=2 SENSITIVE Sensitive     VANCOMYCIN 1 SENSITIVE Sensitive     GENTAMICIN SYNERGY SENSITIVE Sensitive     * RARE ENTEROCOCCUS FAECALIS  Gram stain     Status: None   Collection Time: 03/22/20  8:36 PM   Specimen: Synovium; Body Fluid  Result Value Ref Range Status   Specimen Description SYNOVIAL RIGHT KNEE  Final   Special Requests NONE  Final   Gram Stain   Final    ABUNDANT WBC PRESENT, PREDOMINANTLY PMN NO ORGANISMS SEEN Performed at [facility], [address], [place], [place] [address]    Report Status 03/22/2020 FINAL  Final  Culture, body fluid-bottle     Status: None   Collection Time: 03/22/20  8:42 PM   Specimen: Synovium  Result Value Ref Range Status   Specimen Description SYNOVIAL RIGHT KNEE  Final   Special Requests   Final    BOTTLES DRAWN AEROBIC ONLY Blood Culture adequate volume   Culture   Final    NO GROWTH 5 DAYS Performed at [facility], [place] [address], [place], [place] [address]    Report Status 03/27/2020 FINAL  Final  Culture, blood (routine x 2)     Status: None   Collection Time: 03/23/20  5:12 PM   Specimen: BLOOD  Result Value Ref Range Status   Specimen Description BLOOD LEFT ANTECUBITAL  Final   Special Requests   Final    BOTTLES DRAWN AEROBIC ONLY Blood Culture results may not be optimal due to an inadequate volume of blood received in culture bottles   Culture   Final    NO GROWTH 5 DAYS Performed at [facility], [place] [address], [place], [place] [address]    Report Status 03/28/2020 FINAL  Final  Culture, blood (routine x 2)     Status: None   Collection Time: 03/23/20  5:12 PM    Specimen: BLOOD RIGHT HAND  Result Value Ref Range Status   Specimen Description BLOOD RIGHT HAND  Final   Special Requests   Final    BOTTLES DRAWN AEROBIC ONLY Blood Culture adequate volume   Culture   Final    NO GROWTH 5 DAYS Performed at [facility], [place] [address] [place],  [address]    Report Status 03/28/2020 FINAL  Final  Surgical pcr screen     Status: None   Collection Time: 03/24/20  2:21 AM   Specimen: Nasal Mucosa; Nasal Swab  Result Value Ref Range Status   MRSA, PCR NEGATIVE NEGATIVE Final   Staphylococcus aureus NEGATIVE NEGATIVE Final    Comment: (NOTE) The Xpert SA Assay (FDA approved for NASAL specimens in  patients 41 years of age and older), is one component of a comprehensive surveillance program. It is not intended to diagnose infection nor to guide or monitor treatment. Performed at [facility], [place] [address], [place], [place] [address]   Culture, blood (Routine X 2) w Reflex to ID Panel     Status: None (Preliminary result)   Collection Time: 03/26/20  9:15 PM   Specimen: BLOOD  Result Value Ref Range Status   Specimen Description BLOOD RIGHT ARM  Final   Special Requests   Final    BOTTLES DRAWN AEROBIC AND ANAEROBIC Blood Culture results may not be optimal due to an excessive volume of blood received in culture bottles   Culture   Final    NO GROWTH 2 DAYS Performed at [facility], [place] [address], [place], [place] [address]    Report Status PENDING  Incomplete  Culture, blood (Routine X 2) w Reflex to ID Panel     Status: None (Preliminary result)   Collection Time: 03/26/20  9:25 PM   Specimen: BLOOD  Result Value Ref Range Status   Specimen Description BLOOD LEFT FOREARM  Final   Special Requests   Final    BOTTLES DRAWN AEROBIC AND ANAEROBIC Blood Culture results may not be optimal due to an excessive volume of blood received in culture bottles   Culture   Final    NO GROWTH 2 DAYS Performed at [facility], [place] [address], [place], [place] [address]    Report Status PENDING  Incomplete     Radiology Studies: [place] EKG SITE RITE  Result Date: 03/26/2020 If Site Rite image not attached, placement could not be confirmed due to current cardiac rhythm.   Scheduled Meds: . sodium chloride   Intravenous Once  . sodium chloride   Intravenous Once  . apixaban  5 mg Oral BID  . docusate sodium  100 mg Oral BID  . feeding supplement (ENSURE ENLIVE)  237 mL Oral TID BM  . insulin aspart  0-15 Units Subcutaneous TID WC  . insulin aspart  0-5 Units Subcutaneous QHS  . insulin aspart  3 Units Subcutaneous TID WC  . insulin glargine  10 Units Subcutaneous BID  . iron polysaccharides  150 mg Oral Q breakfast  . levothyroxine  25 mcg Oral Q0600  . midodrine  10 mg Oral TID WC  . senna  1 tablet Oral BID   Continuous Infusions: . ampicillin (OMNIPEN) IV 2 g (03/28/20 1039)     LOS: 8 days   Marylu Lund, MD [facility] Pager On Amion  If 7PM-7AM, please contact night-coverage 03/28/2020, 1:43 PM

## 2020-03-28 NOTE — Plan of Care (Signed)
  Problem: Activity: Goal: Risk for activity intolerance will decrease Outcome: Progressing   

## 2020-03-29 ENCOUNTER — Inpatient Hospital Stay: Payer: Self-pay

## 2020-03-29 LAB — CBC WITH DIFFERENTIAL/PLATELET
Abs Immature Granulocytes: 0.4 10*3/uL — ABNORMAL HIGH (ref 0.00–0.07)
Basophils Absolute: 0 10*3/uL (ref 0.0–0.1)
Basophils Relative: 0 %
Eosinophils Absolute: 0.2 10*3/uL (ref 0.0–0.5)
Eosinophils Relative: 1 %
HCT: 27.9 % — ABNORMAL LOW (ref 39.0–52.0)
Hemoglobin: 8.8 g/dL — ABNORMAL LOW (ref 13.0–17.0)
Immature Granulocytes: 2 %
Lymphocytes Relative: 7 %
Lymphs Abs: 1.3 10*3/uL (ref 0.7–4.0)
MCH: 30.2 pg (ref 26.0–34.0)
MCHC: 31.5 g/dL (ref 30.0–36.0)
MCV: 95.9 fL (ref 80.0–100.0)
Monocytes Absolute: 0.9 10*3/uL (ref 0.1–1.0)
Monocytes Relative: 5 %
Neutro Abs: 14.6 10*3/uL — ABNORMAL HIGH (ref 1.7–7.7)
Neutrophils Relative %: 85 %
Platelets: 274 10*3/uL (ref 150–400)
RBC: 2.91 MIL/uL — ABNORMAL LOW (ref 4.22–5.81)
RDW: 16.2 % — ABNORMAL HIGH (ref 11.5–15.5)
WBC: 17.4 10*3/uL — ABNORMAL HIGH (ref 4.0–10.5)
nRBC: 0 % (ref 0.0–0.2)

## 2020-03-29 LAB — GLUCOSE, CAPILLARY
Glucose-Capillary: 105 mg/dL — ABNORMAL HIGH (ref 70–99)
Glucose-Capillary: 234 mg/dL — ABNORMAL HIGH (ref 70–99)
Glucose-Capillary: 102 mg/dL — ABNORMAL HIGH (ref 70–99)
Glucose-Capillary: 140 mg/dL — ABNORMAL HIGH (ref 70–99)

## 2020-03-29 LAB — COMPREHENSIVE METABOLIC PANEL
ALT: 72 U/L — ABNORMAL HIGH (ref 0–44)
AST: 38 U/L (ref 15–41)
Albumin: 2 g/dL — ABNORMAL LOW (ref 3.5–5.0)
Alkaline Phosphatase: 65 U/L (ref 38–126)
Anion gap: 11 (ref 5–15)
BUN: 16 mg/dL (ref 8–23)
CO2: 24 mmol/L (ref 22–32)
Calcium: 8.8 mg/dL — ABNORMAL LOW (ref 8.9–10.3)
Chloride: 103 mmol/L (ref 98–111)
Creatinine, Ser: 1.36 mg/dL — ABNORMAL HIGH (ref 0.61–1.24)
GFR calc Af Amer: >60 mL/min (ref >60)
GFR calc non Af Amer: 53 mL/min — ABNORMAL LOW (ref >60)
Glucose, Bld: 109 mg/dL — ABNORMAL HIGH (ref 70–99)
Potassium: 4.1 mmol/L (ref 3.5–5.1)
Sodium: 138 mmol/L (ref 135–145)
Total Bilirubin: 1.7 mg/dL — ABNORMAL HIGH (ref 0.3–1.2)
Total Protein: 5.9 g/dL — ABNORMAL LOW (ref 6.5–8.1)

## 2020-03-29 MED ORDER — CHLORHEXIDINE GLUCONATE CLOTH 2 % EX PADS
6.0000 | MEDICATED_PAD | Freq: Every day | CUTANEOUS | Status: DC
  Administered 2020-03-30 – 2020-04-07 (×9): 6 via TOPICAL

## 2020-03-29 MED ORDER — SODIUM CHLORIDE 0.9% FLUSH: 10.0000 mL | Freq: Two times a day (BID) | INTRAVENOUS | Status: DC

## 2020-03-29 MED ORDER — SODIUM CHLORIDE 0.9% FLUSH: 10.0000 mL | INTRAVENOUS | Status: DC | PRN

## 2020-03-29 NOTE — Progress Notes (Signed)
Pt returned back to bed for PICC placement. Patient tolerated OOB to chair for 2.5 hours. Will continue to monitor. Dorthey Sawyer, RN

## 2020-03-29 NOTE — Progress Notes (Signed)
PROGRESS NOTE    Johnathan Murray  [id number] DOB: 01-Dec-1950 DOA: 03/19/2020 PCP: Nicoletta Dress, MD    Brief Narrative:  69 y.o.malewithhistory of paroxysmal atrial fibrillation, hypertension, bioprosthetic aortic valve replacement who had undergone recent cervical discectomy and fusion about 3 weeks ago done by Dr. Trenton Gammon has been feeling weak with increasing pain in the neck and low back and poor appetite. Denies any vomiting or diarrhea denies taking any Tylenol but does take NSAIDs sometimes. Has not noticed any blood in the urine and has been urinating less last few days. Has subjective feeling of fever chills. Given the symptoms patient presents to the ER. Patient states over the last 3 weeks and surgery patient has been finding it difficult to ambulate because of the weakness of the lower extremity. Denies any incontinence of urine or bowel. Has been having more lower extremity edema more on the left side. In the ER on exam patient appears generally weak with weakness of the lower extremity. There is edema of the lower extremity on the left side. Labs show creatinine of 6.4 which is increased from 1.6 about 3 weeks ago. In addition patient's LFTs are markedly elevated at AST of 415 and ALT of 429 patient's albumin is 2.6 total bilirubin 5.5 bicarb 19 sodium 130 calcium 10.2 hemoglobin 10.4 lactic acid 1.6 WBC 12.6 platelets 341 urinalysis shows RBCs of 21-50 WBC 21-50 protein around 100 bacteria rare hyaline cast present mucus present. Patient was given fluid bolus and admitted for acute renal failure. Patient also had CT abdomen pelvis and CT cervical spine which did not show any acute. USS of the abdomen also was unremarkable. Covid test is negative  Assessment & Plan:   Principal Problem:   Enterococcal bacteremia Active Problems:   Essential hypertension   PAF (paroxysmal atrial fibrillation) (HCC)   Abnormal LFTs (liver function tests)   Cervical myelopathy  (HCC)   S/P aortic valve replacement   ARF (acute renal failure) (HCC)   Pseudogout of left knee   Septic arthritis (HCC)   History of fusion of cervical spine   Protein-calorie malnutrition, severe   AKI on CKD stage III/anion gap metabolic acidosis Improving Multifactorial from bacteremia, poor oral intake, in addition to medication induced from NSAID, Lasix and lisinopril use Baseline creatinine 1.3-1.6, on presentation 6.40 Noted urine with RBCs and protein-further work-up Per nephrology IVF now stopped Held patient's Lasix, lisinopril CT abdomen does not show any obstruction Nephrology consulted earlier, since signed off Renal function improving, recheck bmet in AM  Sepsis likely 2/2 Enterococcus faecalis bacteremia On presentation, patient was initially hypotensive, with leukocytosis Source from possible knee infection vs recent postoperative cervical/lumbar infection BC x2 growing Enterococcus faecalis, repeat BC x2 NGTD UC no growth, Chest x-ray unremarkable Lactic acid, TSH WNL, cortisol WNL TTE showed no obvious vegetation but increased transaortic gradients across the bioprosthetic valve  Cardiology was consulted for TEE. Pt underwent TEE 5/6. Valves unremarkable, no vegetation seen. Discussed with Cardiology Fever noted over past 24hrs.  ID recs noted. Plan to complete ampicillin for at least 4 weeks with outpt f/u with ID on 04/16/20.  ID has signed off on 5/7 Have requested PICC placement  Likely LLE septic knee with ruptured Baker's cyst with possibly pseudogout Doppler showed ruptured Baker's cyst on LLE Left knee x-ray showed large joint effusion, septic joint cannot be excluded Orthopedics consulted s/p LLE joint aspiration on 03/21/2020 and RLE joint aspiration on 03/22/2020 both showed positive intracellular calcium pyrophosphate crystals, WBC over  15,000, culture grew rare E faecalis from LLE, culture from RLE without organisms Status post steroid injection in  both knees on 03/22/2020 by orthopedics Now s/p I&D of bilateral knees on 03/24/2020, stable at this time Pt agreeable for CIR  Hyperglycemia Likely 2/2 recent steroid injection in the knee Continue with SSI, Accu-Cheks, hypoglycemic protocol Glycemic trends thus far stable currently  Transaminitis Suspect secondary to above sepsis/hypotension/shock liver INR WNL Acute hepatitis panel nonreactive Home amiodarone currently on hold LFT's are trending towards normal Recheck cmp in AM  Paroxysmal A. Fib/bioprosthetic aortic valve replacement Heart rate WNL Holding home amiodarone TTE as noted above Cont on tele Now on eliquis, tolerating anticoagulation Repeat cbc in AM and monitor closely for bleeding  Neck and back pain History of recent cervical decompression surgery Imaging does not show anything acute as mentioned above MRI cervical spine with no evidence of infection PT/OT consulted, recs noted for CIR. Pending placement  Normocytic anemia/postop anemia Anemia panel iron 43, sats 29, ferritin 3,811 Continued on oral iron supplementation Hgb down to 6.8 recently. Have transfused 2 units PRBC's No evidence of acute blood loss Hgb has remained stable since PRBC transfusion. Will resume home eliquis and monitor closely for bleeding Follow CBC  Hypothyroidism TSH levels WNL Continue Synthroid as pt tolerates  DVT prophylaxis: Eliquis Code Status: Full Family Communication: Pt in room, family at bedside  Status is: Inpatient  Remains inpatient appropriate because:Unsafe d/c plan, Inpatient level of care appropriate due to severity of illness and need to monitor for stability of hgb following blood tranfusion   Dispo: The patient is from: Home              Anticipated d/c is to: CIR              Anticipated d/c date is: 3 days              Patient currently is not medically stable to d/c.  Consultants:   ID  Orthopedic Surgery  Critical  Care  Nephrology  Procedures:   TEE 5/6  Antimicrobials: Anti-infectives (From admission, onward)   Start     Dose/Rate Route Frequency Ordered Stop   03/27/20 0900  cefTRIAXone (ROCEPHIN) 2 g in sodium chloride 0.9 % 100 mL IVPB  Status:  Discontinued     2 g 200 mL/hr over 30 Minutes Intravenous Every 12 hours 03/27/20 0828 03/27/20 0901   03/22/20 1515  cefTRIAXone (ROCEPHIN) 2 g in sodium chloride 0.9 % 100 mL IVPB  Status:  Discontinued     2 g 200 mL/hr over 30 Minutes Intravenous Every 12 hours 03/22/20 1442 03/26/20 1043   03/22/20 1000  ampicillin (OMNIPEN) 2 g in sodium chloride 0.9 % 100 mL IVPB     2 g 300 mL/hr over 20 Minutes Intravenous Every 6 hours 03/22/20 0925 04/21/20 2359   03/21/20 1800  ceFEPIme (MAXIPIME) 1 g in sodium chloride 0.9 % 100 mL IVPB  Status:  Discontinued     1 g 200 mL/hr over 30 Minutes Intravenous Every 24 hours 03/20/20 1143 03/21/20 0116   03/21/20 0200  ampicillin (OMNIPEN) 2 g in sodium chloride 0.9 % 100 mL IVPB  Status:  Discontinued     2 g 300 mL/hr over 20 Minutes Intravenous Every 8 hours 03/21/20 0116 03/22/20 0925   03/20/20 1230  ceFEPIme (MAXIPIME) 1 g in sodium chloride 0.9 % 100 mL IVPB     1 g 200 mL/hr over 30 Minutes Intravenous  Once  03/20/20 1143 03/20/20 1358      Subjective: Feeling well today. Eager to start therapy  Objective: Vitals:   03/28/20 2044 03/29/20 0446 03/29/20 0500 03/29/20 1109  BP: (!) 144/83 133/75  118/66  Pulse: 89 88  92  Resp: 18 18  16   Temp: 99.5 F (37.5 C) 98 F (36.7 C)  98.6 F (37 C)  TempSrc: Oral   Oral  SpO2: 96% 99%  99%  Weight: 79 kg  79 kg   Height:        Intake/Output Summary (Last 24 hours) at 03/29/2020 1505 Last data filed at 03/29/2020 0725 Gross per 24 hour  Intake 670 ml  Output 1650 ml  Net -980 ml   Filed Weights   03/26/20 2048 03/28/20 2044 03/29/20 0500  Weight: 78.5 kg 79 kg 79 kg    Examination: General exam: Conversant, in no acute  distress Respiratory system: normal chest rise, clear, no audible wheezing Cardiovascular system: regular rhythm, s1-s2 Gastrointestinal system: Nondistended, nontender, pos BS Central nervous system: No seizures, no tremors Extremities: No cyanosis, no joint deformities, post-surgical dressings over knees Skin: No rashes, no pallor Psychiatry: Affect normal // no auditory hallucinations   Data Reviewed: I have personally reviewed following labs and imaging studies  CBC: Recent Labs  Lab 03/25/20 0515 03/25/20 0515 03/26/20 0618 03/26/20 1300 03/27/20 0737 03/28/20 0532 03/29/20 0444  WBC 23.1*  --  17.2*  --  21.0* 18.6* 17.4*  NEUTROABS 19.0*  --  16.2*  --  17.8* 15.9* 14.6*  HGB 7.1*   < > 6.3* 9.6* 9.6* 8.8* 8.8*  HCT 21.4*   < > 19.8* 28.7* 28.4* 27.0* 27.9*  MCV 91.5  --  92.5  --  91.9 93.1 95.9  PLT 420*  --  332  --  309 270 274   < > = values in this interval not displayed.   Basic Metabolic Panel: Recent Labs  Lab 03/25/20 0515 03/26/20 0618 03/27/20 0737 03/28/20 0532 03/29/20 0444  NA 134* 139 137 136 138  K 4.3 4.4 4.2 3.8 4.1  CL 99 103 104 101 103  CO2 21* 25 25 25 24   GLUCOSE 307* 162* 171* 138* 109*  BUN 39* 27* 21 17 16   CREATININE 1.91* 1.62* 1.45* 1.45* 1.36*  CALCIUM 9.1 8.8* 8.6* 8.7* 8.8*   GFR: Estimated Creatinine Clearance: 54.4 mL/min (A) (by C-G formula based on SCr of 1.36 mg/dL (H)). Liver Function Tests: Recent Labs  Lab 03/25/20 0515 03/26/20 0618 03/27/20 0737 03/28/20 0532 03/29/20 0444  AST 42* 28 24 25  38  ALT 167* 109* 86* 73* 72*  ALKPHOS 102 87 82 75 65  BILITOT 2.0* 1.4* 1.7* 1.8* 1.7*  PROT 6.3* 5.7* 5.9* 5.7* 5.9*  ALBUMIN 2.1* 1.9* 2.0* 1.9* 2.0*   No results for input(s): LIPASE, AMYLASE in the last 168 hours. No results for input(s): AMMONIA in the last 168 hours. Coagulation Profile: No results for input(s): INR, PROTIME in the last 168 hours. Cardiac Enzymes: No results for input(s): CKTOTAL, CKMB,  CKMBINDEX, TROPONINI in the last 168 hours. BNP (last 3 results) No results for input(s): PROBNP in the last 8760 hours. HbA1C: No results for input(s): HGBA1C in the last 72 hours. CBG: Recent Labs  Lab 03/28/20 1122 03/28/20 1642 03/28/20 2044 03/29/20 0649 03/29/20 1123  GLUCAP 221* 93 118* 105* 234*   Lipid Profile: No results for input(s): CHOL, HDL, LDLCALC, TRIG, CHOLHDL, LDLDIRECT in the last 72 hours. Thyroid Function Tests: No results  for input(s): TSH, T4TOTAL, FREET4, T3FREE, THYROIDAB in the last 72 hours. Anemia Panel: No results for input(s): VITAMINB12, FOLATE, FERRITIN, TIBC, IRON, RETICCTPCT in the last 72 hours. Sepsis Labs: No results for input(s): PROCALCITON, LATICACIDVEN in the last 168 hours.  Recent Results (from the past 240 hour(s))  Respiratory Panel by RT PCR (Flu A&B, Covid) - Nasopharyngeal Swab     Status: None   Collection Time: 03/19/20 10:22 PM   Specimen: Nasopharyngeal Swab  Result Value Ref Range Status   SARS Coronavirus 2 by RT PCR NEGATIVE NEGATIVE Final    Comment: (NOTE) SARS-CoV-2 target nucleic acids are NOT DETECTED. The SARS-CoV-2 RNA is generally detectable in upper respiratoy specimens during the acute phase of infection. The lowest concentration of SARS-CoV-2 viral copies this assay can detect is 131 copies/mL. A negative result does not preclude SARS-Cov-2 infection and should not be used as the sole basis for treatment or other patient management decisions. A negative result may occur with  improper specimen collection/handling, submission of specimen other than nasopharyngeal swab, presence of viral mutation(s) within the areas targeted by this assay, and inadequate number of viral copies (<131 copies/mL). A negative result must be combined with clinical observations, patient history, and epidemiological information. The expected result is Negative. Fact Sheet for Patients:   PinkCheek.be Fact Sheet for Healthcare Providers:  GravelBags.it This test is not yet ap proved or cleared by the [place] FDA and  has been authorized for detection and/or diagnosis of SARS-CoV-2 by FDA under an Emergency Use Authorization (EUA). This EUA will remain  in effect (meaning this test can be used) for the duration of the COVID-19 declaration under Section 564(b)(1) of the Act, 21 U.S.C. section 360bbb-3(b)(1), unless the authorization is terminated or revoked sooner.    Influenza A by PCR NEGATIVE NEGATIVE Final   Influenza B by PCR NEGATIVE NEGATIVE Final    Comment: (NOTE) The Xpert Xpress SARS-CoV-2/FLU/RSV assay is intended as an aid in  the diagnosis of influenza from Nasopharyngeal swab specimens and  should not be used as a sole basis for treatment. Nasal washings and  aspirates are unacceptable for Xpert Xpress SARS-CoV-2/FLU/RSV  testing. Fact Sheet for Patients: PinkCheek.be Fact Sheet for Healthcare Providers: GravelBags.it This test is not yet approved or cleared by the [place] FDA and  has been authorized for detection and/or diagnosis of SARS-CoV-2 by  FDA under an Emergency Use Authorization (EUA). This EUA will remain  in effect (meaning this test can be used) for the duration of the  Covid-19 declaration under Section 564(b)(1) of the Act, 21  U.S.C. section 360bbb-3(b)(1), unless the authorization is  terminated or revoked. Performed at [facility], [place] [address], [place], [place] [address]   Urine culture     Status: None   Collection Time: 03/19/20 10:39 PM   Specimen: Urine, Random  Result Value Ref Range Status   Specimen Description URINE, RANDOM  Final   Special Requests NONE  Final   Culture   Final    NO GROWTH Performed at [facility], [place] [address], [place],  [address]    Report Status  03/20/2020 FINAL  Final  Culture, blood (routine x 2)     Status: Abnormal   Collection Time: 03/20/20  7:45 AM   Specimen: BLOOD RIGHT FOREARM  Result Value Ref Range Status   Specimen Description BLOOD RIGHT FOREARM  Final   Special Requests   Final    BOTTLES DRAWN AEROBIC  ONLY Blood Culture results may not be optimal due to an inadequate volume of blood received in culture bottles   Culture  Setup Time   Final    AEROBIC BOTTLE ONLY GRAM POSITIVE COCCI CRITICAL RESULT CALLED TO, READ BACK BY AND VERIFIED WITH: K AMEND [facility] 03/21/20 0059 JDW Performed at [facility], [address], [place], [place] [address]    Culture ENTEROCOCCUS FAECALIS (A)  Final   Report Status 03/22/2020 FINAL  Final   Organism ID, Bacteria ENTEROCOCCUS FAECALIS  Final      Susceptibility   Enterococcus faecalis - MIC*    AMPICILLIN <=2 SENSITIVE Sensitive     VANCOMYCIN 1 SENSITIVE Sensitive     GENTAMICIN SYNERGY SENSITIVE Sensitive     * ENTEROCOCCUS FAECALIS  Blood Culture ID Panel (Reflexed)     Status: Abnormal   Collection Time: 03/20/20  7:45 AM  Result Value Ref Range Status   Enterococcus species DETECTED (A) NOT DETECTED Final    Comment: CRITICAL RESULT CALLED TO, READ BACK BY AND VERIFIED WITH: K AMEND PHARMD 03/21/20 0059 JDW    Vancomycin resistance NOT DETECTED NOT DETECTED Final   Listeria monocytogenes NOT DETECTED NOT DETECTED Final   Staphylococcus species NOT DETECTED NOT DETECTED Final   Staphylococcus aureus (BCID) NOT DETECTED NOT DETECTED Final   Streptococcus species NOT DETECTED NOT DETECTED Final   Streptococcus agalactiae NOT DETECTED NOT DETECTED Final   Streptococcus pneumoniae NOT DETECTED NOT DETECTED Final   Streptococcus pyogenes NOT DETECTED NOT DETECTED Final   Acinetobacter baumannii NOT DETECTED NOT DETECTED Final   Enterobacteriaceae species NOT DETECTED NOT DETECTED Final   Enterobacter cloacae complex NOT DETECTED NOT DETECTED Final   Escherichia coli  NOT DETECTED NOT DETECTED Final   Klebsiella oxytoca NOT DETECTED NOT DETECTED Final   Klebsiella pneumoniae NOT DETECTED NOT DETECTED Final   Proteus species NOT DETECTED NOT DETECTED Final   Serratia marcescens NOT DETECTED NOT DETECTED Final   Haemophilus influenzae NOT DETECTED NOT DETECTED Final   Neisseria meningitidis NOT DETECTED NOT DETECTED Final   Pseudomonas aeruginosa NOT DETECTED NOT DETECTED Final   Candida albicans NOT DETECTED NOT DETECTED Final   Candida glabrata NOT DETECTED NOT DETECTED Final   Candida krusei NOT DETECTED NOT DETECTED Final   Candida parapsilosis NOT DETECTED NOT DETECTED Final   Candida tropicalis NOT DETECTED NOT DETECTED Final    Comment: Performed at [facility], [place] [address], [place], [place] [address]  Culture, blood (routine x 2)     Status: Abnormal   Collection Time: 03/20/20  8:55 AM   Specimen: BLOOD  Result Value Ref Range Status   Specimen Description BLOOD LEFT ANTECUBITAL  Final   Special Requests   Final    BOTTLES DRAWN AEROBIC AND ANAEROBIC Blood Culture results may not be optimal due to an inadequate volume of blood received in culture bottles   Culture  Setup Time   Final    IN BOTH AEROBIC AND ANAEROBIC BOTTLES GRAM POSITIVE COCCI CRITICAL VALUE NOTED.  VALUE IS CONSISTENT WITH PREVIOUSLY REPORTED AND CALLED VALUE.    Culture (A)  Final    ENTEROCOCCUS FAECALIS SUSCEPTIBILITIES PERFORMED ON PREVIOUS CULTURE WITHIN THE LAST 5 DAYS. Performed at [facility], [place] [address] [place], [place] [address]    Report Status 03/22/2020 FINAL  Final  Body fluid culture     Status: None   Collection Time: 03/21/20  4:42 PM   Specimen: Body Fluid  Result Value Ref Range Status   Specimen Description FLUID SYNOVIAL LEFT KNEE  Final   Special Requests SYRINGE  Final   Gram Stain   Final    FEW WBC PRESENT,BOTH PMN AND MONONUCLEAR NO ORGANISMS SEEN    Culture   Final    RARE ENTEROCOCCUS FAECALIS CRITICAL RESULT  CALLED TO, READ BACK BY AND VERIFIED WITH: RN A MITCHELL [phone] AT 41 AM BY CM Performed at [facility], [place] [address], [place], [place] [address]    Report Status 03/24/2020 FINAL  Final   Organism ID, Bacteria ENTEROCOCCUS FAECALIS  Final      Susceptibility   Enterococcus faecalis - MIC*    AMPICILLIN <=2 SENSITIVE Sensitive     VANCOMYCIN 1 SENSITIVE Sensitive     GENTAMICIN SYNERGY SENSITIVE Sensitive     * RARE ENTEROCOCCUS FAECALIS  Gram stain     Status: None   Collection Time: 03/22/20  8:36 PM   Specimen: Synovium; Body Fluid  Result Value Ref Range Status   Specimen Description SYNOVIAL RIGHT KNEE  Final   Special Requests NONE  Final   Gram Stain   Final    ABUNDANT WBC PRESENT, PREDOMINANTLY PMN NO ORGANISMS SEEN Performed at [facility], [address], [place], [place] [address]    Report Status 03/22/2020 FINAL  Final  Culture, body fluid-bottle     Status: None   Collection Time: 03/22/20  8:42 PM   Specimen: Synovium  Result Value Ref Range Status   Specimen Description SYNOVIAL RIGHT KNEE  Final   Special Requests   Final    BOTTLES DRAWN AEROBIC ONLY Blood Culture adequate volume   Culture   Final    NO GROWTH 5 DAYS Performed at [facility], [place] [address] [place], [place] [address]    Report Status 03/27/2020 FINAL  Final  Culture, blood (routine x 2)     Status: None   Collection Time: 03/23/20  5:12 PM   Specimen: BLOOD  Result Value Ref Range Status   Specimen Description BLOOD LEFT ANTECUBITAL  Final   Special Requests   Final    BOTTLES DRAWN AEROBIC ONLY Blood Culture results may not be optimal due to an inadequate volume of blood received in culture bottles   Culture   Final    NO GROWTH 5 DAYS Performed at [facility], [place] [address] [place], [place] [address]    Report Status 03/28/2020 FINAL  Final  Culture, blood (routine x 2)     Status: None   Collection Time: 03/23/20  5:12 PM   Specimen: BLOOD RIGHT  HAND  Result Value Ref Range Status   Specimen Description BLOOD RIGHT HAND  Final   Special Requests   Final    BOTTLES DRAWN AEROBIC ONLY Blood Culture adequate volume   Culture   Final    NO GROWTH 5 DAYS Performed at [facility], [place] [address] [place],  [address]    Report Status 03/28/2020 FINAL  Final  Surgical pcr screen     Status: None   Collection Time: 03/24/20  2:21 AM   Specimen: Nasal Mucosa; Nasal Swab  Result Value Ref Range Status   MRSA, PCR NEGATIVE NEGATIVE Final   Staphylococcus aureus NEGATIVE NEGATIVE Final    Comment: (NOTE) The Xpert SA Assay (FDA approved for NASAL specimens in patients 87 years of age and older), is one component of a comprehensive surveillance program. It  is not intended to diagnose infection nor to guide or monitor treatment. Performed at [facility], [place] [address] [place], [place] [address]   Culture, blood (Routine X 2) w Reflex to ID Panel     Status: None (Preliminary result)   Collection Time: 03/26/20  9:15 PM   Specimen: BLOOD  Result Value Ref Range Status   Specimen Description BLOOD RIGHT ARM  Final   Special Requests   Final    BOTTLES DRAWN AEROBIC AND ANAEROBIC Blood Culture results may not be optimal due to an excessive volume of blood received in culture bottles   Culture   Final    NO GROWTH 3 DAYS Performed at [facility], [place] [address], [place], [place] [address]    Report Status PENDING  Incomplete  Culture, blood (Routine X 2) w Reflex to ID Panel     Status: None (Preliminary result)   Collection Time: 03/26/20  9:25 PM   Specimen: BLOOD  Result Value Ref Range Status   Specimen Description BLOOD LEFT FOREARM  Final   Special Requests   Final    BOTTLES DRAWN AEROBIC AND ANAEROBIC Blood Culture results may not be optimal due to an excessive volume of blood received in culture bottles   Culture   Final    NO GROWTH 3 DAYS Performed at [facility], [place] [address], [place], [place] [address]    Report Status PENDING  Incomplete     Radiology Studies: [place] EKG SITE RITE  Result Date: 03/29/2020 If Site Rite image not attached, placement could not be confirmed due to current cardiac rhythm.   Scheduled Meds: . sodium chloride   Intravenous Once  . sodium chloride   Intravenous Once  . apixaban  5 mg Oral BID  . docusate sodium  100 mg Oral BID  . feeding supplement (ENSURE ENLIVE)  237 mL Oral TID BM  . insulin aspart  0-15 Units Subcutaneous TID WC  . insulin aspart  0-5 Units Subcutaneous QHS  . insulin aspart  3 Units Subcutaneous TID WC  . insulin glargine  10 Units Subcutaneous BID  . iron polysaccharides  150 mg Oral Q breakfast  . levothyroxine  25 mcg Oral Q0600  . midodrine  10 mg Oral TID WC  . senna  1 tablet Oral BID   Continuous Infusions: . ampicillin (OMNIPEN) IV 2 g (03/29/20 1112)     LOS: 9 days   Marylu Lund, MD [facility] Pager On Amion  If 7PM-7AM, please contact night-coverage 03/29/2020, 3:05 PM

## 2020-03-29 NOTE — Progress Notes (Signed)
Spoke with Location manager .  Pt up to chair, prefer to plan on PICC placement at 1400.

## 2020-03-29 NOTE — Progress Notes (Signed)
PICC consent obtained after lengthy conversation with pt and wife. All questions answered to their satisfaction.  Verbalizes anxiety re procedure and home care.

## 2020-03-29 NOTE — Progress Notes (Signed)
Peripherally Inserted Central Catheter Placement  The IV Nurse has discussed with the patient and/or persons authorized to consent for the patient, the purpose of this procedure and the potential benefits and risks involved with this procedure.  The benefits include less needle sticks, lab draws from the catheter, and the patient may be discharged home with the catheter. Risks include, but not limited to, infection, bleeding, blood clot (thrombus formation), and puncture of an artery; nerve damage and irregular heartbeat and possibility to perform a PICC exchange if needed/ordered by physician.  Alternatives to this procedure were also discussed.  Bard Power PICC patient education guide, fact sheet on infection prevention and patient information card has been provided to patient /or left at bedside.    PICC Placement Documentation  PICC Single Lumen 03/29/20 PICC Right Brachial 0 cm 37 cm (Active)  Indication for Insertion or Continuance of Line Home intravenous therapies (PICC only) 03/29/20 1650  Exposed Catheter (cm) 0 cm 03/29/20 1650  Site Assessment Clean;Dry;Intact 03/29/20 1650  Line Status Flushed;Saline locked;Blood return noted 03/29/20 1650  Dressing Type Transparent 03/29/20 1650  Dressing Status Clean;Dry;Intact;Antimicrobial disc in place 03/29/20 1650  Safety Lock Not Applicable [id number]  Dressing Intervention New dressing 03/29/20 1650  Dressing Change Due 04/05/20 03/29/20 1650       Gordan Payment 03/29/2020, 4:52 PM

## 2020-03-30 LAB — CBC WITH DIFFERENTIAL/PLATELET
Abs Immature Granulocytes: 0.22 10*3/uL — ABNORMAL HIGH (ref 0.00–0.07)
Basophils Absolute: 0 10*3/uL (ref 0.0–0.1)
Basophils Relative: 0 %
Eosinophils Absolute: 0.1 10*3/uL (ref 0.0–0.5)
Eosinophils Relative: 1 %
HCT: 25.8 % — ABNORMAL LOW (ref 39.0–52.0)
Hemoglobin: 8.3 g/dL — ABNORMAL LOW (ref 13.0–17.0)
Immature Granulocytes: 2 %
Lymphocytes Relative: 9 %
Lymphs Abs: 1.1 10*3/uL (ref 0.7–4.0)
MCH: 30.9 pg (ref 26.0–34.0)
MCHC: 32.2 g/dL (ref 30.0–36.0)
MCV: 95.9 fL (ref 80.0–100.0)
Monocytes Absolute: 0.7 10*3/uL (ref 0.1–1.0)
Monocytes Relative: 5 %
Neutro Abs: 10.2 10*3/uL — ABNORMAL HIGH (ref 1.7–7.7)
Neutrophils Relative %: 83 %
Platelets: 259 10*3/uL (ref 150–400)
RBC: 2.69 MIL/uL — ABNORMAL LOW (ref 4.22–5.81)
RDW: 16 % — ABNORMAL HIGH (ref 11.5–15.5)
WBC: 12.4 10*3/uL — ABNORMAL HIGH (ref 4.0–10.5)
nRBC: 0 % (ref 0.0–0.2)

## 2020-03-30 LAB — COMPREHENSIVE METABOLIC PANEL
ALT: 77 U/L — ABNORMAL HIGH (ref 0–44)
AST: 49 U/L — ABNORMAL HIGH (ref 15–41)
Albumin: 2 g/dL — ABNORMAL LOW (ref 3.5–5.0)
Alkaline Phosphatase: 76 U/L (ref 38–126)
Anion gap: 8 (ref 5–15)
BUN: 17 mg/dL (ref 8–23)
CO2: 25 mmol/L (ref 22–32)
Calcium: 8.6 mg/dL — ABNORMAL LOW (ref 8.9–10.3)
Chloride: 103 mmol/L (ref 98–111)
Creatinine, Ser: 1.45 mg/dL — ABNORMAL HIGH (ref 0.61–1.24)
GFR calc Af Amer: 57 mL/min — ABNORMAL LOW (ref >60)
GFR calc non Af Amer: 49 mL/min — ABNORMAL LOW (ref >60)
Glucose, Bld: 177 mg/dL — ABNORMAL HIGH (ref 70–99)
Potassium: 3.7 mmol/L (ref 3.5–5.1)
Sodium: 136 mmol/L (ref 135–145)
Total Bilirubin: 1.7 mg/dL — ABNORMAL HIGH (ref 0.3–1.2)
Total Protein: 5.7 g/dL — ABNORMAL LOW (ref 6.5–8.1)

## 2020-03-30 LAB — GLUCOSE, CAPILLARY
Glucose-Capillary: 150 mg/dL — ABNORMAL HIGH (ref 70–99)
Glucose-Capillary: 204 mg/dL — ABNORMAL HIGH (ref 70–99)
Glucose-Capillary: 94 mg/dL (ref 70–99)
Glucose-Capillary: 154 mg/dL — ABNORMAL HIGH (ref 70–99)

## 2020-03-30 NOTE — Progress Notes (Signed)
Physical Therapy Treatment Patient Details Name: Johnathan Murray MRN: [id number] DOB: 28-Mar-1951 Today's Date: 03/30/2020    History of Present Illness Pt is a 69 y/o male s/p ACDF C3-4 on 4/12, admitted to [facility] on 4/30 for progressive weakness, fatigue, bilateral knee pain L>R. Pt with AKI, bacteremia, L septic knee with plan for I&D bilateral knees on 5/4 pm . PMHx includes previous ACDF C4-6 (04/2019), lumbar sx (05/2019), CAD, DM, HTN, hx of CABGNow s/p I&D of bilateral knees on 03/24/2020    PT Comments    Continuing work on functional mobility and activity tolerance;  Participating well, even with the incr back pain today; Highly motivated to return to independence; Open to education re: back pain, and therex, and knee positioning for best healing; required mod assist of 2 for sit to stand from elevated surface; Continue to recommend comprehensive inpatient rehab (CIR) for post-acute therapy needs.   Follow Up Recommendations  CIR     Equipment Recommendations  None recommended by PT    Recommendations for Other Services       Precautions / Restrictions Precautions Precautions: Fall;Cervical Precaution Comments: ACDF on 4/12, pt reports they d/c-ed collar 1 week after surgery    Mobility  Bed Mobility Overal bed mobility: Needs Assistance Bed Mobility: Supine to Sit     Supine to sit: Min guard;HOB elevated     General bed mobility comments: Increased time to complete. Utilized bed rails. Pt utilized BUEs to move BLEs onto/off of bed.   Transfers Overall transfer level: Needs assistance Equipment used: Rolling walker (2 wheeled) Transfers: Sit to/from Stand Sit to Stand: Mod assist;+2 physical assistance         General transfer comment: Cues for hand placement. Extensive assist to power up into standing from low surface. Onset of low back pain almost immediately -- pain lasted about 5-6 seconds  Ambulation/Gait Ambulation/Gait assistance: Mod assist;Min  assist;+2 physical assistance;+2 safety/equipment Gait Distance (Feet): 15 Feet Assistive device: Rolling walker (2 wheeled) Gait Pattern/deviations: Decreased step length - right;Decreased step length - left;Trunk flexed Gait velocity: significantly decreased   General Gait Details: pt with very slow, cautious and guarded gait; frequent cueing for safe use of RW and to maintain proximity to AD.    Stairs             Wheelchair Mobility    Modified Rankin (Stroke Patients Only)       Balance     Sitting balance-Leahy Scale: Fair       Standing balance-Leahy Scale: Poor Standing balance comment: heavy reliance on RW with BUEs.                             Cognition Arousal/Alertness: Awake/alert Behavior During Therapy: WFL for tasks assessed/performed Overall Cognitive Status: Within Functional Limits for tasks assessed                                        Exercises      General Comments General comments (skin integrity, edema, etc.): Pt's wife present throughout session; Provided education re: positioning for better ROM and healing      Pertinent Vitals/Pain Pain Assessment: 0-10 Pain Score: 10-Worst pain ever Pain Location: Back and bilateral knees, left knee more than right; Overall back pain is the worst Pain Descriptors / Indicators: Cramping;Discomfort Pain Intervention(s): Limited activity within  patient's tolerance    Home Living                      Prior Function            PT Goals (current goals can now be found in the care plan section) Acute Rehab PT Goals Patient Stated Goal: go home, feel stronger PT Goal Formulation: With patient Time For Goal Achievement: 04/07/20 Potential to Achieve Goals: Good Progress towards PT goals: Progressing toward goals    Frequency    Min 3X/week      PT Plan Current plan remains appropriate    Co-evaluation              AM-PAC PT "6 Clicks"  Mobility   Outcome Measure  Help needed turning from your back to your side while in a flat bed without using bedrails?: None Help needed moving from lying on your back to sitting on the side of a flat bed without using bedrails?: None Help needed moving to and from a bed to a chair (including a wheelchair)?: A Lot Help needed standing up from a chair using your arms (e.g., wheelchair or bedside chair)?: A Lot Help needed to walk in hospital room?: A Lot Help needed climbing 3-5 steps with a railing? : Total 6 Click Score: 15    End of Session Equipment Utilized During Treatment: Gait belt Activity Tolerance: Patient limited by fatigue;Patient limited by pain Patient left: in chair;with call bell/phone within reach;with chair alarm set;with family/visitor present Nurse Communication: Mobility status PT Visit Diagnosis: Muscle weakness (generalized) (M62.81);Difficulty in walking, not elsewhere classified (R26.2)     Time: [id number] PT Time Calculation (min) (ACUTE ONLY): 43 min  Charges:  $Gait Training: 8-22 mins $Therapeutic Activity: 23-37 mins                     Roney Marion, PT  Acute Rehabilitation Services Pager [phone] Office [place] 03/30/2020, 5:55 PM

## 2020-03-30 NOTE — Progress Notes (Addendum)
PROGRESS NOTE    Johnathan Murray  [id number] DOB: Mar 21, 1951 DOA: 03/19/2020 PCP: Nicoletta Dress, MD    Brief Narrative:  69 y.o.malewithhistory of paroxysmal atrial fibrillation, hypertension, bioprosthetic aortic valve replacement who had undergone recent cervical discectomy and fusion about 3 weeks ago done by Dr. Trenton Gammon has been feeling weak with increasing pain in the neck and low back and poor appetite. Denies any vomiting or diarrhea denies taking any Tylenol but does take NSAIDs sometimes. Has not noticed any blood in the urine and has been urinating less last few days. Has subjective feeling of fever chills. Given the symptoms patient presents to the ER. Patient states over the last 3 weeks and surgery patient has been finding it difficult to ambulate because of the weakness of the lower extremity. Denies any incontinence of urine or bowel. Has been having more lower extremity edema more on the left side. In the ER on exam patient appears generally weak with weakness of the lower extremity. There is edema of the lower extremity on the left side. Labs show creatinine of 6.4 which is increased from 1.6 about 3 weeks ago. In addition patient's LFTs are markedly elevated at AST of 415 and ALT of 429 patient's albumin is 2.6 total bilirubin 5.5 bicarb 19 sodium 130 calcium 10.2 hemoglobin 10.4 lactic acid 1.6 WBC 12.6 platelets 341 urinalysis shows RBCs of 21-50 WBC 21-50 protein around 100 bacteria rare hyaline cast present mucus present. Patient was given fluid bolus and admitted for acute renal failure. Patient also had CT abdomen pelvis and CT cervical spine which did not show any acute. USS of the abdomen also was unremarkable. Covid test is negative  Assessment & Plan:   Principal Problem:   Enterococcal bacteremia Active Problems:   Essential hypertension   PAF (paroxysmal atrial fibrillation) (HCC)   Abnormal LFTs (liver function tests)   Cervical myelopathy  (HCC)   S/P aortic valve replacement   ARF (acute renal failure) (HCC)   Pseudogout of left knee   Septic arthritis (HCC)   History of fusion of cervical spine   Protein-calorie malnutrition, severe   AKI on CKD stage III/anion gap metabolic acidosis Improving Multifactorial from bacteremia, poor oral intake, in addition to medication induced from NSAID, Lasix and lisinopril use Baseline creatinine 1.3-1.6, on presentation 6.40 Noted urine with RBCs and protein-further work-up Per nephrology IVF now stopped Held patient's Lasix, lisinopril CT abdomen does not show any obstruction Nephrology consulted earlier, since signed off Cont to follow renal function  Sepsis likely 2/2 Enterococcus faecalis bacteremia On presentation, patient was initially hypotensive, with leukocytosis Source from possible knee infection vs recent postoperative cervical/lumbar infection BC x2 growing Enterococcus faecalis, repeat BC x2 NGTD UC no growth, Chest x-ray unremarkable Lactic acid, TSH WNL, cortisol WNL TTE showed no obvious vegetation but increased transaortic gradients across the bioprosthetic valve  Cardiology was consulted for TEE. Pt underwent TEE 5/6. Valves unremarkable, no vegetation seen. Discussed with Cardiology Fever noted over past 24hrs.  ID recs noted. Plan to complete ampicillin for at least 4 weeks with outpt f/u with ID on 04/16/20.  ID has signed off on 5/7 PICC placed 5/9  Likely LLE septic knee with ruptured Baker's cyst with possibly pseudogout Doppler showed ruptured Baker's cyst on LLE Left knee x-ray showed large joint effusion, septic joint cannot be excluded Orthopedics consulted s/p LLE joint aspiration on 03/21/2020 and RLE joint aspiration on 03/22/2020 both showed positive intracellular calcium pyrophosphate crystals, WBC over 15,000, culture grew  rare E faecalis from LLE, culture from RLE without organisms Status post steroid injection in both knees on 03/22/2020 by  orthopedics Now s/p I&D of bilateral knees on 03/24/2020, stable at this time Pt agreeable for CIR, pending CIR eval  Hyperglycemia Likely 2/2 recent steroid injection in the knee Continue with SSI, Accu-Cheks, hypoglycemic protocol Glycemic trends thus far stable currently  Transaminitis Suspect secondary to above sepsis/hypotension/shock liver INR WNL Acute hepatitis panel nonreactive Home amiodarone currently on hold LFT's are trending towards normal Repeat bmet in AM  Paroxysmal A. Fib/bioprosthetic aortic valve replacement Heart rate WNL Holding home amiodarone TTE as noted above Cont on tele Now on eliquis, tolerating anticoagulation Hgb overall stable, follow CBC trends  Neck and back pain History of recent cervical decompression surgery Imaging does not show anything acute as mentioned above MRI cervical spine with no evidence of infection PT/OT consulted, recs noted for CIR. Pending placement  Normocytic anemia/postop anemia Anemia panel iron 43, sats 29, ferritin 3,811 Continued on oral iron supplementation Hgb down to 6.8 recently. Have transfused 2 units PRBC's No evidence of acute blood loss Hgb has remained stable since PRBC transfusion. Will resume home eliquis and monitor closely for bleeding Follow CBC trends  Hypothyroidism TSH levels WNL Continue Synthroid as tolerated  DVT prophylaxis: Eliquis Code Status: Full Family Communication: Pt in room, family at bedside  Status is: Inpatient  Remains inpatient appropriate because:Unsafe d/c plan   Dispo: The patient is from: Home              Anticipated d/c is to: CIR              Anticipated d/c date is: 2 days, awaiting decision from CIR              Patient currently is medically stable to d/c.  Consultants:   ID  Orthopedic Surgery  Critical Care  Nephrology  Procedures:   TEE 5/6  Antimicrobials: Anti-infectives (From admission, onward)   Start     Dose/Rate Route  Frequency Ordered Stop   03/27/20 0900  cefTRIAXone (ROCEPHIN) 2 g in sodium chloride 0.9 % 100 mL IVPB  Status:  Discontinued     2 g 200 mL/hr over 30 Minutes Intravenous Every 12 hours 03/27/20 0828 03/27/20 0901   03/22/20 1515  cefTRIAXone (ROCEPHIN) 2 g in sodium chloride 0.9 % 100 mL IVPB  Status:  Discontinued     2 g 200 mL/hr over 30 Minutes Intravenous Every 12 hours 03/22/20 1442 03/26/20 1043   03/22/20 1000  ampicillin (OMNIPEN) 2 g in sodium chloride 0.9 % 100 mL IVPB     2 g 300 mL/hr over 20 Minutes Intravenous Every 6 hours 03/22/20 0925 04/21/20 2359   03/21/20 1800  ceFEPIme (MAXIPIME) 1 g in sodium chloride 0.9 % 100 mL IVPB  Status:  Discontinued     1 g 200 mL/hr over 30 Minutes Intravenous Every 24 hours 03/20/20 1143 03/21/20 0116   03/21/20 0200  ampicillin (OMNIPEN) 2 g in sodium chloride 0.9 % 100 mL IVPB  Status:  Discontinued     2 g 300 mL/hr over 20 Minutes Intravenous Every 8 hours 03/21/20 0116 03/22/20 0925   03/20/20 1230  ceFEPIme (MAXIPIME) 1 g in sodium chloride 0.9 % 100 mL IVPB     1 g 200 mL/hr over 30 Minutes Intravenous  Once 03/20/20 1143 03/20/20 1358      Subjective: Without complaints. Eager to go start rehab, thus home soon  Objective: Vitals:   03/29/20 1745 03/29/20 2001 03/30/20 0638 03/30/20 1014  BP: 124/68 134/71 114/73 123/71  Pulse: 92 89 96 91  Resp: 18 18 18 18   Temp: 98.7 F (37.1 C) 98.9 F (37.2 C) 97.7 F (36.5 C) 98.9 F (37.2 C)  TempSrc: Oral Oral Oral Oral  SpO2: 99% 99% 100% 98%  Weight:      Height:        Intake/Output Summary (Last 24 hours) at 03/30/2020 1512 Last data filed at 03/30/2020 1422 Gross per 24 hour  Intake 1150 ml  Output 1750 ml  Net -600 ml   Filed Weights   03/26/20 2048 03/28/20 2044 03/29/20 0500  Weight: 78.5 kg 79 kg 79 kg    Examination: General exam: Awake, laying in bed, in nad Respiratory system: Normal respiratory effort, no wheezing Cardiovascular system: regular  rate, s1, s2 Gastrointestinal system: Soft, nondistended, positive BS Central nervous system: CN2-12 grossly intact, strength intact Extremities: Perfused, no clubbing Skin: Normal skin turgor, no notable skin lesions seen Psychiatry: Mood normal // no visual hallucinations   Data Reviewed: I have personally reviewed following labs and imaging studies  CBC: Recent Labs  Lab 03/26/20 0618 03/26/20 0618 03/26/20 1300 03/27/20 0737 03/28/20 0532 03/29/20 0444 03/30/20 0303  WBC 17.2*  --   --  21.0* 18.6* 17.4* 12.4*  NEUTROABS 16.2*  --   --  17.8* 15.9* 14.6* 10.2*  HGB 6.3*   < > 9.6* 9.6* 8.8* 8.8* 8.3*  HCT 19.8*   < > 28.7* 28.4* 27.0* 27.9* 25.8*  MCV 92.5  --   --  91.9 93.1 95.9 95.9  PLT 332  --   --  309 270 274 259   < > = values in this interval not displayed.   Basic Metabolic Panel: Recent Labs  Lab 03/26/20 0618 03/27/20 0737 03/28/20 0532 03/29/20 0444 03/30/20 0303  NA 139 137 136 138 136  K 4.4 4.2 3.8 4.1 3.7  CL 103 104 101 103 103  CO2 25 25 25 24 25   GLUCOSE 162* 171* 138* 109* 177*  BUN 27* 21 17 16 17   CREATININE 1.62* 1.45* 1.45* 1.36* 1.45*  CALCIUM 8.8* 8.6* 8.7* 8.8* 8.6*   GFR: Estimated Creatinine Clearance: 51 mL/min (A) (by C-G formula based on SCr of 1.45 mg/dL (H)). Liver Function Tests: Recent Labs  Lab 03/26/20 0618 03/27/20 0737 03/28/20 0532 03/29/20 0444 03/30/20 0303  AST 28 24 25  38 49*  ALT 109* 86* 73* 72* 77*  ALKPHOS 87 82 75 65 76  BILITOT 1.4* 1.7* 1.8* 1.7* 1.7*  PROT 5.7* 5.9* 5.7* 5.9* 5.7*  ALBUMIN 1.9* 2.0* 1.9* 2.0* 2.0*   No results for input(s): LIPASE, AMYLASE in the last 168 hours. No results for input(s): AMMONIA in the last 168 hours. Coagulation Profile: No results for input(s): INR, PROTIME in the last 168 hours. Cardiac Enzymes: No results for input(s): CKTOTAL, CKMB, CKMBINDEX, TROPONINI in the last 168 hours. BNP (last 3 results) No results for input(s): PROBNP in the last 8760  hours. HbA1C: No results for input(s): HGBA1C in the last 72 hours. CBG: Recent Labs  Lab 03/29/20 1123 03/29/20 1717 03/29/20 2141 03/30/20 0738 03/30/20 1119  GLUCAP 234* 102* 140* 150* 204*   Lipid Profile: No results for input(s): CHOL, HDL, LDLCALC, TRIG, CHOLHDL, LDLDIRECT in the last 72 hours. Thyroid Function Tests: No results for input(s): TSH, T4TOTAL, FREET4, T3FREE, THYROIDAB in the last 72 hours. Anemia Panel: No results for input(s): VITAMINB12,  FOLATE, FERRITIN, TIBC, IRON, RETICCTPCT in the last 72 hours. Sepsis Labs: No results for input(s): PROCALCITON, LATICACIDVEN in the last 168 hours.  Recent Results (from the past 240 hour(s))  Body fluid culture     Status: None   Collection Time: 03/21/20  4:42 PM   Specimen: Body Fluid  Result Value Ref Range Status   Specimen Description FLUID SYNOVIAL LEFT KNEE  Final   Special Requests SYRINGE  Final   Gram Stain   Final    FEW WBC PRESENT,BOTH PMN AND MONONUCLEAR NO ORGANISMS SEEN    Culture   Final    RARE ENTEROCOCCUS FAECALIS CRITICAL RESULT CALLED TO, READ BACK BY AND VERIFIED WITH: RN A MITCHELL [phone] AT 62 AM BY CM Performed at [facility], [place] [address], [place], [place] [address]    Report Status 03/24/2020 FINAL  Final   Organism ID, Bacteria ENTEROCOCCUS FAECALIS  Final      Susceptibility   Enterococcus faecalis - MIC*    AMPICILLIN <=2 SENSITIVE Sensitive     VANCOMYCIN 1 SENSITIVE Sensitive     GENTAMICIN SYNERGY SENSITIVE Sensitive     * RARE ENTEROCOCCUS FAECALIS  Gram stain     Status: None   Collection Time: 03/22/20  8:36 PM   Specimen: Synovium; Body Fluid  Result Value Ref Range Status   Specimen Description SYNOVIAL RIGHT KNEE  Final   Special Requests NONE  Final   Gram Stain   Final    ABUNDANT WBC PRESENT, PREDOMINANTLY PMN NO ORGANISMS SEEN Performed at [facility], [address], [place], [place] [address]    Report Status 03/22/2020 FINAL  Final   Culture, body fluid-bottle     Status: None   Collection Time: 03/22/20  8:42 PM   Specimen: Synovium  Result Value Ref Range Status   Specimen Description SYNOVIAL RIGHT KNEE  Final   Special Requests   Final    BOTTLES DRAWN AEROBIC ONLY Blood Culture adequate volume   Culture   Final    NO GROWTH 5 DAYS Performed at [facility], [place] [address], [place], [place] [address]    Report Status 03/27/2020 FINAL  Final  Culture, blood (routine x 2)     Status: None   Collection Time: 03/23/20  5:12 PM   Specimen: BLOOD  Result Value Ref Range Status   Specimen Description BLOOD LEFT ANTECUBITAL  Final   Special Requests   Final    BOTTLES DRAWN AEROBIC ONLY Blood Culture results may not be optimal due to an inadequate volume of blood received in culture bottles   Culture   Final    NO GROWTH 5 DAYS Performed at [facility], [place] [address], [place], [place] [address]    Report Status 03/28/2020 FINAL  Final  Culture, blood (routine x 2)     Status: None   Collection Time: 03/23/20  5:12 PM   Specimen: BLOOD RIGHT HAND  Result Value Ref Range Status   Specimen Description BLOOD RIGHT HAND  Final   Special Requests   Final    BOTTLES DRAWN AEROBIC ONLY Blood Culture adequate volume   Culture   Final    NO GROWTH 5 DAYS Performed at [facility], [place] [address] [place], [place] [address]    Report Status 03/28/2020 FINAL  Final  Surgical pcr screen     Status: None   Collection Time: 03/24/20  2:21 AM   Specimen: Nasal  Mucosa; Nasal Swab  Result Value Ref Range Status   MRSA, PCR NEGATIVE NEGATIVE Final   Staphylococcus aureus NEGATIVE NEGATIVE Final    Comment: (NOTE) The Xpert SA Assay (FDA approved for NASAL specimens in patients 60 years of age and older), is one component of a comprehensive surveillance program. It is not intended to diagnose infection nor to guide or monitor treatment. Performed at [facility], [place] [address],  [place], [place] [address]   Culture, blood (Routine X 2) w Reflex to ID Panel     Status: None (Preliminary result)   Collection Time: 03/26/20  9:15 PM   Specimen: BLOOD  Result Value Ref Range Status   Specimen Description BLOOD RIGHT ARM  Final   Special Requests   Final    BOTTLES DRAWN AEROBIC AND ANAEROBIC Blood Culture results may not be optimal due to an excessive volume of blood received in culture bottles   Culture   Final    NO GROWTH 4 DAYS Performed at [facility], [place] [address] [place], [place] [address]    Report Status PENDING  Incomplete  Culture, blood (Routine X 2) w Reflex to ID Panel     Status: None (Preliminary result)   Collection Time: 03/26/20  9:25 PM   Specimen: BLOOD  Result Value Ref Range Status   Specimen Description BLOOD LEFT FOREARM  Final   Special Requests   Final    BOTTLES DRAWN AEROBIC AND ANAEROBIC Blood Culture results may not be optimal due to an excessive volume of blood received in culture bottles   Culture   Final    NO GROWTH 4 DAYS Performed at [facility], [place] [address], [place], [place] [address]    Report Status PENDING  Incomplete     Radiology Studies: [place] EKG SITE RITE  Result Date: 03/29/2020 If Site Rite image not attached, placement could not be confirmed due to current cardiac rhythm.   Scheduled Meds: . sodium chloride   Intravenous Once  . apixaban  5 mg Oral BID  . Chlorhexidine Gluconate Cloth  6 each Topical Daily  . docusate sodium  100 mg Oral BID  . feeding supplement (ENSURE ENLIVE)  237 mL Oral TID BM  . insulin aspart  0-15 Units Subcutaneous TID WC  . insulin aspart  0-5 Units Subcutaneous QHS  . insulin aspart  3 Units Subcutaneous TID WC  . insulin glargine  10 Units Subcutaneous BID  . iron polysaccharides  150 mg Oral Q breakfast  . levothyroxine  25 mcg Oral Q0600  . midodrine  10 mg Oral TID WC  . senna  1 tablet Oral BID  . sodium chloride flush  10-40 mL Intracatheter Q12H    Continuous Infusions: . ampicillin (OMNIPEN) IV 2 g (03/30/20 0951)     LOS: 10 days   Marylu Lund, MD [facility] Pager On Amion  If 7PM-7AM, please contact night-coverage 03/30/2020, 3:12 PM

## 2020-03-31 LAB — CULTURE, BLOOD (ROUTINE X 2)
Culture: NO GROWTH
Culture: NO GROWTH

## 2020-03-31 LAB — CBC WITH DIFFERENTIAL/PLATELET
Abs Immature Granulocytes: 0.09 10*3/uL — ABNORMAL HIGH (ref 0.00–0.07)
Basophils Absolute: 0 10*3/uL (ref 0.0–0.1)
Basophils Relative: 0 %
Eosinophils Absolute: 0.1 10*3/uL (ref 0.0–0.5)
Eosinophils Relative: 1 %
HCT: 24.5 % — ABNORMAL LOW (ref 39.0–52.0)
Hemoglobin: 7.7 g/dL — ABNORMAL LOW (ref 13.0–17.0)
Immature Granulocytes: 1 %
Lymphocytes Relative: 10 %
Lymphs Abs: 1.1 10*3/uL (ref 0.7–4.0)
MCH: 30 pg (ref 26.0–34.0)
MCHC: 31.4 g/dL (ref 30.0–36.0)
MCV: 95.3 fL (ref 80.0–100.0)
Monocytes Absolute: 0.7 10*3/uL (ref 0.1–1.0)
Monocytes Relative: 6 %
Neutro Abs: 8.8 10*3/uL — ABNORMAL HIGH (ref 1.7–7.7)
Neutrophils Relative %: 82 %
Platelets: 244 10*3/uL (ref 150–400)
RBC: 2.57 MIL/uL — ABNORMAL LOW (ref 4.22–5.81)
RDW: 15.9 % — ABNORMAL HIGH (ref 11.5–15.5)
WBC: 10.9 10*3/uL — ABNORMAL HIGH (ref 4.0–10.5)
nRBC: 0 % (ref 0.0–0.2)

## 2020-03-31 LAB — BASIC METABOLIC PANEL
Anion gap: 10 (ref 5–15)
BUN: 17 mg/dL (ref 8–23)
CO2: 25 mmol/L (ref 22–32)
Calcium: 8.6 mg/dL — ABNORMAL LOW (ref 8.9–10.3)
Chloride: 101 mmol/L (ref 98–111)
Creatinine, Ser: 1.36 mg/dL — ABNORMAL HIGH (ref 0.61–1.24)
GFR calc Af Amer: >60 mL/min (ref >60)
GFR calc non Af Amer: 53 mL/min — ABNORMAL LOW (ref >60)
Glucose, Bld: 185 mg/dL — ABNORMAL HIGH (ref 70–99)
Potassium: 4.1 mmol/L (ref 3.5–5.1)
Sodium: 136 mmol/L (ref 135–145)

## 2020-03-31 LAB — GLUCOSE, CAPILLARY
Glucose-Capillary: 146 mg/dL — ABNORMAL HIGH (ref 70–99)
Glucose-Capillary: 155 mg/dL — ABNORMAL HIGH (ref 70–99)
Glucose-Capillary: 79 mg/dL (ref 70–99)
Glucose-Capillary: 143 mg/dL — ABNORMAL HIGH (ref 70–99)

## 2020-03-31 LAB — CBC
HCT: 26 % — ABNORMAL LOW (ref 39.0–52.0)
Hemoglobin: 8.3 g/dL — ABNORMAL LOW (ref 13.0–17.0)
MCH: 30.5 pg (ref 26.0–34.0)
MCHC: 31.9 g/dL (ref 30.0–36.0)
MCV: 95.6 fL (ref 80.0–100.0)
Platelets: 270 10*3/uL (ref 150–400)
RBC: 2.72 MIL/uL — ABNORMAL LOW (ref 4.22–5.81)
RDW: 15.8 % — ABNORMAL HIGH (ref 11.5–15.5)
WBC: 12.8 10*3/uL — ABNORMAL HIGH (ref 4.0–10.5)
nRBC: 0 % (ref 0.0–0.2)

## 2020-03-31 MED ORDER — POLYETHYLENE GLYCOL 3350 17 GM/SCOOP PO POWD
1.0000 | Freq: Once | ORAL | Status: AC
  Administered 2020-03-31: 255 g via ORAL
  Filled 2020-03-31: qty 255

## 2020-03-31 MED ORDER — SODIUM CHLORIDE 0.9 % IV SOLN
INTRAVENOUS | Status: DC | PRN
  Administered 2020-04-01 – 2020-04-02 (×2): 250 mL via INTRAVENOUS

## 2020-03-31 NOTE — Progress Notes (Signed)
Occupational Therapy Treatment Patient Details Name: Johnathan Murray MRN: [id number] DOB: 10-09-1951 Today's Date: 03/31/2020    History of present illness Pt is a 68 y/o male s/p ACDF C3-4 on 4/12, admitted to [facility] on 4/30 for progressive weakness, fatigue, bilateral knee pain L>R. Pt with AKI, bacteremia, L septic knee with plan for I&D bilateral knees on 5/4 pm . PMHx includes previous ACDF C4-6 (04/2019), lumbar sx (05/2019), CAD, DM, HTN, hx of CABGNow s/p I&D of bilateral knees on 03/24/2020   OT comments  Pt making consistent progress with functional goals. Pt with "pinching" pain posterior of R knee during mobility, however performed and or attempted everything asked of him this session. Session focused on bed mobility, simulated LB ADLs, grooming and use of RW towards bathroom although only able to make to bathroom door due to pain and B UE fatigue. Pt's wife and CIR PT CM in to see pt. Pt and his wife amenable to CIR. OT will continue to follow acutely  Follow Up Recommendations  CIR    Equipment Recommendations  Other (comment)(TBD at next venue of care)    Recommendations for Other Services      Precautions / Restrictions Precautions Precautions: Fall;Cervical Precaution Comments: ACDF on 4/12, pt reports they d/c-ed collar 1 week after surgery Restrictions Weight Bearing Restrictions: No       Mobility Bed Mobility Overal bed mobility: Needs Assistance Bed Mobility: Supine to Sit     Supine to sit: Min guard;HOB elevated Sit to supine: Supervision;HOB elevated   General bed mobility comments: Slow process with incr time, but smoother transition than previous sessions, and did not need UEsw to help LEs move to EOB  Transfers Overall transfer level: Needs assistance Equipment used: Rolling walker (2 wheeled) Transfers: Sit to/from Stand Sit to Stand: +2 physical assistance;Min assist         General transfer comment: Still requiring assist/bilateral support to  power up to stand; bed elevated slightly more than yesterday    Balance Overall balance assessment: Needs assistance Sitting-balance support: Feet supported;No upper extremity supported Sitting balance-Leahy Scale: Fair     Standing balance support: During functional activity;Bilateral upper extremity supported Standing balance-Leahy Scale: Poor Standing balance comment: heavy reliance on RW with BUEs.                            ADL either performed or assessed with clinical judgement   ADL Overall ADL's : Needs assistance/impaired     Grooming: Wash/dry hands;Wash/dry face;Set up;Sitting;With caregiver independent assisting           Upper Body Dressing : Set up;Supervision/safety;Sitting;With caregiver independent assisting     Lower Body Dressing Details (indicate cue type and reason): Pt able to don socks seated in recliner, flexing L knee up to chair Toilet Transfer: Minimal assistance;+2 for physical assistance;Cueing for safety;Cueing for sequencing;Ambulation;RW Toilet Transfer Details (indicate cue type and reason): simulated to recliner. Pt couldn't quite make it ambulating towards bathroom with RW due to B UEs fatiguing Toileting- Clothing Manipulation and Hygiene: Minimal assistance;Sit to/from stand       Functional mobility during ADLs: Minimal assistance;+2 for physical assistance;Rolling walker;Cueing for safety;Cueing for sequencing       Vision Baseline Vision/History: Wears glasses Wears Glasses: Reading only Patient Visual Report: No change from baseline     Perception     Praxis      Cognition Arousal/Alertness: Awake/alert Behavior During Therapy: WFL for tasks  assessed/performed Overall Cognitive Status: Within Functional Limits for tasks assessed                                          Exercises Other Exercises Other Exercises: Alternating heelslides in bed x5 Other Exercises: Gentle trunk rotation in  sitting   Shoulder Instructions       General Comments Pt's wife present throughout session; Provided education re: positioning for better ROM and healing; Able to somewhat reporduce his catching/pinching pain posterior aspect of R knee with palpation of distal hamstring tendons    Pertinent Vitals/ Pain       Pain Assessment: Faces Faces Pain Scale: Hurts even more Pain Location: Back and bilateral knees, left knee more than right; Overall back pain is the worst Pain Descriptors / Indicators: Cramping;Discomfort Pain Intervention(s): Limited activity within patient's tolerance;Repositioned;Heat applied  Home Living                                          Prior Functioning/Environment              Frequency           Progress Toward Goals  OT Goals(current goals can now be found in the care plan section)  Progress towards OT goals: Progressing toward goals  Acute Rehab OT Goals Patient Stated Goal: go home, feel stronger  Plan Discharge plan needs to be updated    Co-evaluation    PT/OT/SLP Co-Evaluation/Treatment: Yes Reason for Co-Treatment: Complexity of the patient's impairments (multi-system involvement);For patient/therapist safety PT goals addressed during session: Mobility/safety with mobility OT goals addressed during session: ADL's and self-care;Proper use of Adaptive equipment and DME      AM-PAC OT "6 Clicks" Daily Activity     Outcome Measure   Help from another person eating meals?: None Help from another person taking care of personal grooming?: None Help from another person toileting, which includes using toliet, bedpan, or urinal?: A Lot Help from another person bathing (including washing, rinsing, drying)?: A Lot Help from another person to put on and taking off regular upper body clothing?: A Little Help from another person to put on and taking off regular lower body clothing?: A Lot 6 Click Score: 17    End of  Session Equipment Utilized During Treatment: Rolling walker;Gait belt  OT Visit Diagnosis: Unsteadiness on feet (R26.81);Muscle weakness (generalized) (M62.81);Other abnormalities of gait and mobility (R26.89);Pain Pain - Right/Left: Right Pain - part of body: Knee(posterior R knee and back)   Activity Tolerance Patient limited by fatigue;Patient limited by pain   Patient Left with call bell/phone within reach;with family/visitor present;in chair;Other (comment)(CIR CM)   Nurse Communication          [id number] OT Time Calculation (min): 44 min  Charges: OT General Charges $OT Visit: 1 Visit OT Treatments $Self Care/Home Management : 8-22 mins     Britt Bottom 03/31/2020, 1:20 PM

## 2020-03-31 NOTE — Progress Notes (Signed)
PROGRESS NOTE    Johnathan Murray  [id number] DOB: November 13, 1951 DOA: 03/19/2020 PCP: Nicoletta Dress, MD    Brief Narrative:  69 y.o.malewithhistory of paroxysmal atrial fibrillation, hypertension, bioprosthetic aortic valve replacement who had undergone recent cervical discectomy and fusion about 3 weeks ago done by Dr. Trenton Gammon has been feeling weak with increasing pain in the neck and low back and poor appetite. Denies any vomiting or diarrhea denies taking any Tylenol but does take NSAIDs sometimes. Has not noticed any blood in the urine and has been urinating less last few days. Has subjective feeling of fever chills. Given the symptoms patient presents to the ER. Patient states over the last 3 weeks and surgery patient has been finding it difficult to ambulate because of the weakness of the lower extremity. Denies any incontinence of urine or bowel. Has been having more lower extremity edema more on the left side. In the ER on exam patient appears generally weak with weakness of the lower extremity. There is edema of the lower extremity on the left side. Labs show creatinine of 6.4 which is increased from 1.6 about 3 weeks ago. In addition patient's LFTs are markedly elevated at AST of 415 and ALT of 429 patient's albumin is 2.6 total bilirubin 5.5 bicarb 19 sodium 130 calcium 10.2 hemoglobin 10.4 lactic acid 1.6 WBC 12.6 platelets 341 urinalysis shows RBCs of 21-50 WBC 21-50 protein around 100 bacteria rare hyaline cast present mucus present. Patient was given fluid bolus and admitted for acute renal failure. Patient also had CT abdomen pelvis and CT cervical spine which did not show any acute. USS of the abdomen also was unremarkable. Covid test is negative  Assessment & Plan:   Principal Problem:   Enterococcal bacteremia Active Problems:   Essential hypertension   PAF (paroxysmal atrial fibrillation) (HCC)   Abnormal LFTs (liver function tests)   Cervical myelopathy  (HCC)   S/P aortic valve replacement   ARF (acute renal failure) (HCC)   Pseudogout of left knee   Septic arthritis (HCC)   History of fusion of cervical spine   Protein-calorie malnutrition, severe   AKI on CKD stage III/anion gap metabolic acidosis Improving Multifactorial from bacteremia, poor oral intake, in addition to medication induced from NSAID, Lasix and lisinopril use Baseline creatinine 1.3-1.6, on presentation 6.40 Noted urine with RBCs and protein-further work-up Per nephrology IVF now stopped Held patient's Lasix, lisinopril CT abdomen does not show any obstruction Nephrology consulted earlier, since signed off Cont to follow renal function  Sepsis likely 2/2 Enterococcus faecalis bacteremia On presentation, patient was initially hypotensive, with leukocytosis Source from possible knee infection vs recent postoperative cervical/lumbar infection BC x2 growing Enterococcus faecalis, repeat BC x2 NGTD UC no growth, Chest x-ray unremarkable Lactic acid, TSH WNL, cortisol WNL TTE showed no obvious vegetation but increased transaortic gradients across the bioprosthetic valve  Cardiology was consulted for TEE. Pt underwent TEE 5/6. Valves unremarkable, no vegetation seen. Discussed with Cardiology Fever noted over past 24hrs.  ID recs noted. Plan to complete ampicillin for at least 4 weeks with outpt f/u with ID on 04/16/20.  ID has signed off on 5/7 PICC placed 5/9  Likely LLE septic knee with ruptured Baker's cyst with possibly pseudogout Doppler showed ruptured Baker's cyst on LLE Left knee x-ray showed large joint effusion, septic joint cannot be excluded Orthopedics consulted s/p LLE joint aspiration on 03/21/2020 and RLE joint aspiration on 03/22/2020 both showed positive intracellular calcium pyrophosphate crystals, WBC over 15,000, culture grew  rare E faecalis from LLE, culture from RLE without organisms Status post steroid injection in both knees on 03/22/2020 by  orthopedics Now s/p I&D of bilateral knees on 03/24/2020, stable at this time Pt agreeable for CIR, pending bed availability  Hyperglycemia Likely 2/2 recent steroid injection in the knee Continue with SSI, Accu-Cheks, hypoglycemic protocol Glycemic trends thus far stable currently  Transaminitis Suspect secondary to above sepsis/hypotension/shock liver INR WNL Acute hepatitis panel nonreactive Home amiodarone currently on hold LFT's had been trending towards normal Recheck bmet in AM  Paroxysmal A. Fib/bioprosthetic aortic valve replacement Heart rate WNL Holding home amiodarone TTE as noted above Cont on tele Now on eliquis Hgb had been stable, however down to 7.7 today. No evidence of bleeding can be identified. Have ordered repeat CBC for this afternoon. If Hgb lower, would then hold eliquis Pt without bowel movement for several days. Have ordered a cathartic and will check stool for blood once pt has a BM  Neck and back pain History of recent cervical decompression surgery Imaging does not show anything acute as mentioned above MRI cervical spine with no evidence of infection PT/OT consulted, recs noted for CIR. Pending placement and bed availability  Normocytic anemia/postop anemia Anemia panel iron 43, sats 29, ferritin 3,811 Continued on oral iron supplementation Hgb down to 6.8 recently s/p 2 units PRBC's No evidence of acute blood loss Hgb had remained stable since PRBC transfusion while on eliquis, however hgb today 7.7 No acute source of blood loss, albeit pt has not had BM yet. Would check stool for blood and have repeated CBC. If hgb lower, would hold eliquis  Hypothyroidism TSH levels WNL Continue Synthroid as pt tolerates  DVT prophylaxis: Eliquis Code Status: Full Family Communication: Pt in room, family at bedside  Status is: Inpatient  Remains inpatient appropriate because:Ongoing diagnostic testing needed not appropriate for outpatient work  up and Unsafe d/c plan, need to confirm no active bleed   Dispo: The patient is from: Home              Anticipated d/c is to: CIR              Anticipated d/c date is: 2 days, awaiting CIR placement/bed availability              Patient currently is not medically stable to d/c.  Consultants:   ID  Orthopedic Surgery  Critical Care  Nephrology  Procedures:   TEE 5/6  Antimicrobials: Anti-infectives (From admission, onward)   Start     Dose/Rate Route Frequency Ordered Stop   03/27/20 0900  cefTRIAXone (ROCEPHIN) 2 g in sodium chloride 0.9 % 100 mL IVPB  Status:  Discontinued     2 g 200 mL/hr over 30 Minutes Intravenous Every 12 hours 03/27/20 0828 03/27/20 0901   03/22/20 1515  cefTRIAXone (ROCEPHIN) 2 g in sodium chloride 0.9 % 100 mL IVPB  Status:  Discontinued     2 g 200 mL/hr over 30 Minutes Intravenous Every 12 hours 03/22/20 1442 03/26/20 1043   03/22/20 1000  ampicillin (OMNIPEN) 2 g in sodium chloride 0.9 % 100 mL IVPB     2 g 300 mL/hr over 20 Minutes Intravenous Every 6 hours 03/22/20 0925 04/21/20 2359   03/21/20 1800  ceFEPIme (MAXIPIME) 1 g in sodium chloride 0.9 % 100 mL IVPB  Status:  Discontinued     1 g 200 mL/hr over 30 Minutes Intravenous Every 24 hours 03/20/20 1143 03/21/20 0116   03/21/20  0200  ampicillin (OMNIPEN) 2 g in sodium chloride 0.9 % 100 mL IVPB  Status:  Discontinued     2 g 300 mL/hr over 20 Minutes Intravenous Every 8 hours 03/21/20 0116 03/22/20 0925   03/20/20 1230  ceFEPIme (MAXIPIME) 1 g in sodium chloride 0.9 % 100 mL IVPB     1 g 200 mL/hr over 30 Minutes Intravenous  Once 03/20/20 1143 03/20/20 1358      Subjective: Complaining of continued and chronic low back pain  Objective: Vitals:   03/30/20 1656 03/30/20 2135 03/31/20 0552 03/31/20 0931  BP: 136/80 138/70 123/78 136/78  Pulse: 93 88 84 88  Resp: 19 18 18 18   Temp: 98.5 F (36.9 C) 99.4 F (37.4 C) 99.1 F (37.3 C) 98.4 F (36.9 C)  TempSrc:  Oral Oral Oral    SpO2: 100% 97% 98% 98%  Weight:  78 kg    Height:        Intake/Output Summary (Last 24 hours) at 03/31/2020 1710 Last data filed at 03/31/2020 1557 Gross per 24 hour  Intake 1600 ml  Output 1600 ml  Net 0 ml   Filed Weights   03/28/20 2044 03/29/20 0500 03/30/20 2135  Weight: 79 kg 79 kg 78 kg    Examination: General exam: Conversant, in no acute distress Respiratory system: normal chest rise, clear, no audible wheezing Cardiovascular system: regular rhythm, s1-s2 Gastrointestinal system: Nondistended, nontender, pos BS Central nervous system: No seizures, no tremors Extremities: No cyanosis, no joint deformities, B knee incisions healing, mild effusions on exam Skin: No rashes, no pallor Psychiatry: Affect normal // no auditory hallucinations   Data Reviewed: I have personally reviewed following labs and imaging studies  CBC: Recent Labs  Lab 03/27/20 0737 03/28/20 0532 03/29/20 0444 03/30/20 0303 03/31/20 0254  WBC 21.0* 18.6* 17.4* 12.4* 10.9*  NEUTROABS 17.8* 15.9* 14.6* 10.2* 8.8*  HGB 9.6* 8.8* 8.8* 8.3* 7.7*  HCT 28.4* 27.0* 27.9* 25.8* 24.5*  MCV 91.9 93.1 95.9 95.9 95.3  PLT 309 270 274 259 [id number]   Basic Metabolic Panel: Recent Labs  Lab 03/27/20 0737 03/28/20 0532 03/29/20 0444 03/30/20 0303 03/31/20 0254  NA 137 136 138 136 136  K 4.2 3.8 4.1 3.7 4.1  CL 104 101 103 103 101  CO2 25 25 24 25 25   GLUCOSE 171* 138* 109* 177* 185*  BUN 21 17 16 17 17   CREATININE 1.45* 1.45* 1.36* 1.45* 1.36*  CALCIUM 8.6* 8.7* 8.8* 8.6* 8.6*   GFR: Estimated Creatinine Clearance: 54.4 mL/min (A) (by C-G formula based on SCr of 1.36 mg/dL (H)). Liver Function Tests: Recent Labs  Lab 03/26/20 0618 03/27/20 0737 03/28/20 0532 03/29/20 0444 03/30/20 0303  AST 28 24 25  38 49*  ALT 109* 86* 73* 72* 77*  ALKPHOS 87 82 75 65 76  BILITOT 1.4* 1.7* 1.8* 1.7* 1.7*  PROT 5.7* 5.9* 5.7* 5.9* 5.7*  ALBUMIN 1.9* 2.0* 1.9* 2.0* 2.0*   No results for input(s):  LIPASE, AMYLASE in the last 168 hours. No results for input(s): AMMONIA in the last 168 hours. Coagulation Profile: No results for input(s): INR, PROTIME in the last 168 hours. Cardiac Enzymes: No results for input(s): CKTOTAL, CKMB, CKMBINDEX, TROPONINI in the last 168 hours. BNP (last 3 results) No results for input(s): PROBNP in the last 8760 hours. HbA1C: No results for input(s): HGBA1C in the last 72 hours. CBG: Recent Labs  Lab 03/30/20 1655 03/30/20 2134 03/31/20 0636 03/31/20 1132 03/31/20 1617  GLUCAP 94 154* 146*  155* 79   Lipid Profile: No results for input(s): CHOL, HDL, LDLCALC, TRIG, CHOLHDL, LDLDIRECT in the last 72 hours. Thyroid Function Tests: No results for input(s): TSH, T4TOTAL, FREET4, T3FREE, THYROIDAB in the last 72 hours. Anemia Panel: No results for input(s): VITAMINB12, FOLATE, FERRITIN, TIBC, IRON, RETICCTPCT in the last 72 hours. Sepsis Labs: No results for input(s): PROCALCITON, LATICACIDVEN in the last 168 hours.  Recent Results (from the past 240 hour(s))  Gram stain     Status: None   Collection Time: 03/22/20  8:36 PM   Specimen: Synovium; Body Fluid  Result Value Ref Range Status   Specimen Description SYNOVIAL RIGHT KNEE  Final   Special Requests NONE  Final   Gram Stain   Final    ABUNDANT WBC PRESENT, PREDOMINANTLY PMN NO ORGANISMS SEEN Performed at [facility], [address] [place], [place] [address]    Report Status 03/22/2020 FINAL  Final  Culture, body fluid-bottle     Status: None   Collection Time: 03/22/20  8:42 PM   Specimen: Synovium  Result Value Ref Range Status   Specimen Description SYNOVIAL RIGHT KNEE  Final   Special Requests   Final    BOTTLES DRAWN AEROBIC ONLY Blood Culture adequate volume   Culture   Final    NO GROWTH 5 DAYS Performed at [facility], [place] [address], [place], [place] [address]    Report Status 03/27/2020 FINAL  Final  Culture, blood (routine x 2)     Status: None    Collection Time: 03/23/20  5:12 PM   Specimen: BLOOD  Result Value Ref Range Status   Specimen Description BLOOD LEFT ANTECUBITAL  Final   Special Requests   Final    BOTTLES DRAWN AEROBIC ONLY Blood Culture results may not be optimal due to an inadequate volume of blood received in culture bottles   Culture   Final    NO GROWTH 5 DAYS Performed at [facility], [place] [address], [place], [place] [address]    Report Status 03/28/2020 FINAL  Final  Culture, blood (routine x 2)     Status: None   Collection Time: 03/23/20  5:12 PM   Specimen: BLOOD RIGHT HAND  Result Value Ref Range Status   Specimen Description BLOOD RIGHT HAND  Final   Special Requests   Final    BOTTLES DRAWN AEROBIC ONLY Blood Culture adequate volume   Culture   Final    NO GROWTH 5 DAYS Performed at [facility], [place] [address] [place], [place] [address]    Report Status 03/28/2020 FINAL  Final  Surgical pcr screen     Status: None   Collection Time: 03/24/20  2:21 AM   Specimen: Nasal Mucosa; Nasal Swab  Result Value Ref Range Status   MRSA, PCR NEGATIVE NEGATIVE Final   Staphylococcus aureus NEGATIVE NEGATIVE Final    Comment: (NOTE) The Xpert SA Assay (FDA approved for NASAL specimens in patients 44 years of age and older), is one component of a comprehensive surveillance program. It is not intended to diagnose infection nor to guide or monitor treatment. Performed at [facility], [place] [address] [place], [place] [address]   Culture, blood (Routine X 2) w Reflex to ID Panel     Status: None   Collection Time: 03/26/20  9:15 PM   Specimen: BLOOD  Result Value Ref Range Status   Specimen Description BLOOD RIGHT ARM  Final  Special Requests   Final    BOTTLES DRAWN AEROBIC AND ANAEROBIC Blood Culture results may not be optimal due to an excessive volume of blood received in culture bottles   Culture   Final    NO GROWTH 5 DAYS Performed at [facility], [place] [address],  [place], [place] [address]    Report Status 03/31/2020 FINAL  Final  Culture, blood (Routine X 2) w Reflex to ID Panel     Status: None   Collection Time: 03/26/20  9:25 PM   Specimen: BLOOD  Result Value Ref Range Status   Specimen Description BLOOD LEFT FOREARM  Final   Special Requests   Final    BOTTLES DRAWN AEROBIC AND ANAEROBIC Blood Culture results may not be optimal due to an excessive volume of blood received in culture bottles   Culture   Final    NO GROWTH 5 DAYS Performed at [facility], [place] [address] [place], [place] [address]    Report Status 03/31/2020 FINAL  Final     Radiology Studies: No results found.  Scheduled Meds: . sodium chloride   Intravenous Once  . apixaban  5 mg Oral BID  . Chlorhexidine Gluconate Cloth  6 each Topical Daily  . docusate sodium  100 mg Oral BID  . feeding supplement (ENSURE ENLIVE)  237 mL Oral TID BM  . insulin aspart  0-15 Units Subcutaneous TID WC  . insulin aspart  0-5 Units Subcutaneous QHS  . insulin aspart  3 Units Subcutaneous TID WC  . insulin glargine  10 Units Subcutaneous BID  . iron polysaccharides  150 mg Oral Q breakfast  . levothyroxine  25 mcg Oral Q0600  . midodrine  10 mg Oral TID WC  . senna  1 tablet Oral BID  . sodium chloride flush  10-40 mL Intracatheter Q12H   Continuous Infusions: . ampicillin (OMNIPEN) IV 2 g (03/31/20 1526)     LOS: 11 days   Marylu Lund, MD [facility] Pager On Amion  If 7PM-7AM, please contact night-coverage 03/31/2020, 5:10 PM

## 2020-03-31 NOTE — Progress Notes (Signed)
Physical Therapy Treatment Patient Details Name: Johnathan Murray MRN: [id number] DOB: 16-Oct-1951 Today's Date: 03/31/2020    History of Present Illness Pt is a 69 y/o male s/p ACDF C3-4 on 4/12, admitted to [facility] on 4/30 for progressive weakness, fatigue, bilateral knee pain L>R. Pt with AKI, bacteremia, L septic knee with plan for I&D bilateral knees on 5/4 pm . PMHx includes previous ACDF C4-6 (04/2019), lumbar sx (05/2019), CAD, DM, HTN, hx of CABGNow s/p I&D of bilateral knees on 03/24/2020    PT Comments    Continuing work on functional mobility and activity tolerance;  Johnathan Murray participated and tried everything asked of him, even with his increased pain, which is largely unchanged from last session; the notable and promising changes, though, is that he is moving overall smoother with transitions, and needing a bit less assist for mobiltiy and transfers;   Can't quite put my finger on the etiology of his "pinching"-type pain posterior aspect of R knee; palpation and pressure at distal hamstring tendons reproduced his pain; this pain is also concurrent (coincidental?) with back pain, though his back pain is worse on the contralateral side; heat and pressure seemed to help his back pain; Perhaps musculoskeletal/tendonous pain?  Lenghtly discussion with pt and wife re: CIR, and both sound amenable to going to CIR for post-acute rehab      Follow Up Recommendations  CIR     Equipment Recommendations  None recommended by PT    Recommendations for Other Services       Precautions / Restrictions Precautions Precautions: Fall;Cervical Precaution Comments: ACDF on 4/12, pt reports they d/c-ed collar 1 week after surgery    Mobility  Bed Mobility Overal bed mobility: Needs Assistance Bed Mobility: Supine to Sit     Supine to sit: Min guard;HOB elevated     General bed mobility comments: Slow process with incr time, but smoother transition than previous sessions, and did not need  UEsw to help LEs move to EOB  Transfers Overall transfer level: Needs assistance Equipment used: Rolling walker (2 wheeled) Transfers: Sit to/from Stand Sit to Stand: +2 physical assistance;Min assist         General transfer comment: Still requiring assist/bilateral support to power up to stand; bed elevated slightly more than yesterday  Ambulation/Gait Ambulation/Gait assistance: Mod assist;Min assist;+2 physical assistance;+2 safety/equipment Gait Distance (Feet): 16 Feet Assistive device: Rolling walker (2 wheeled) Gait Pattern/deviations: Decreased step length - right;Decreased step length - left;Trunk flexed Gait velocity: significantly decreased   General Gait Details: pt with very slow, cautious and guarded gait; frequent cueing and mod assist for safe use/advancement of RW;  low back pain while taking steps, applied manual pressure to L low back, and he indicated it did help with pain   Stairs             Wheelchair Mobility    Modified Rankin (Stroke Patients Only)       Balance     Sitting balance-Leahy Scale: Fair       Standing balance-Leahy Scale: Poor Standing balance comment: heavy reliance on RW with BUEs.                             Cognition Arousal/Alertness: Awake/alert Behavior During Therapy: WFL for tasks assessed/performed Overall Cognitive Status: Within Functional Limits for tasks assessed  Exercises Other Exercises Other Exercises: Alternating heelslides in bed x5 Other Exercises: Gentle trunk rotation in sitting    General Comments General comments (skin integrity, edema, etc.): Pt's wife present throughout session; Provided education re: positioning for better ROM and healing; Able to somewhat reporduce his catching/pinching pain posterior aspect of R knee with palpation of distal hamstring tendons      Pertinent Vitals/Pain Pain Assessment: Faces Faces  Pain Scale: Hurts even more Pain Location: Back and bilateral knees, left knee more than right; Overall back pain is the worst Pain Descriptors / Indicators: Cramping;Discomfort Pain Intervention(s): Repositioned;Heat applied;Other (comment)(gentle manual pressure)    Home Living                      Prior Function            PT Goals (current goals can now be found in the care plan section) Acute Rehab PT Goals Patient Stated Goal: go home, feel stronger PT Goal Formulation: With patient Time For Goal Achievement: 04/07/20 Potential to Achieve Goals: Good Progress towards PT goals: Progressing toward goals    Frequency    Min 3X/week      PT Plan Current plan remains appropriate    Co-evaluation PT/OT/SLP Co-Evaluation/Treatment: Yes Reason for Co-Treatment: For patient/therapist safety;Complexity of the patient's impairments (multi-system involvement) PT goals addressed during session: Mobility/safety with mobility OT goals addressed during session: ADL's and self-care      AM-PAC PT "6 Clicks" Mobility   Outcome Measure  Help needed turning from your back to your side while in a flat bed without using bedrails?: None Help needed moving from lying on your back to sitting on the side of a flat bed without using bedrails?: None Help needed moving to and from a bed to a chair (including a wheelchair)?: A Lot Help needed standing up from a chair using your arms (e.g., wheelchair or bedside chair)?: A Lot Help needed to walk in hospital room?: A Lot Help needed climbing 3-5 steps with a railing? : Total 6 Click Score: 15    End of Session Equipment Utilized During Treatment: Gait belt Activity Tolerance: Patient limited by fatigue;Patient limited by pain Patient left: in chair;with call bell/phone within reach;with chair alarm set;with family/visitor present Nurse Communication: Mobility status PT Visit Diagnosis: Muscle weakness (generalized)  (M62.81);Difficulty in walking, not elsewhere classified (R26.2)     Time: [id number] PT Time Calculation (min) (ACUTE ONLY): 44 min  Charges:  $Gait Training: 8-22 mins $Therapeutic Activity: 8-22 mins                     Roney Marion, PT  Acute Rehabilitation Services Pager [phone] Office [place] 03/31/2020, 12:43 PM

## 2020-03-31 NOTE — Progress Notes (Addendum)
Inpatient Rehab Admissions Coordinator:    I met with pt and wife at bedside during therapy session.  Pt tolerated session, but was limited by pain throughout (?spasms versus radicular pain).  He is open to rehab at this point.  I do not have a bed available for him today.  I will continue to follow for timing of possible admission pending bed availability.   Shann Medal, PT, DPT Admissions Coordinator [phone] 03/31/20  11:22 AM

## 2020-03-31 NOTE — Progress Notes (Signed)
Physical Therapy Treatment Patient Details Name: Johnathan Murray MRN: [id number] DOB: 02-29-1951 Today's Date: 03/31/2020    History of Present Illness Pt is a 69 y/o male s/p ACDF C3-4 on 4/12, admitted to [facility] on 4/30 for progressive weakness, fatigue, bilateral knee pain L>R. Pt with AKI, bacteremia, L septic knee with plan for I&D bilateral knees on 5/4 pm . PMHx includes previous ACDF C4-6 (04/2019), lumbar sx (05/2019), CAD, DM, HTN, hx of CABGNow s/p I&D of bilateral knees on 03/24/2020    PT Comments    Continuing work on functional mobility and activity tolerance;  Returned to room to help problem-solve mode of getting back to bed; still very painful, and pt was worried about standing from a low recliner seat; Suggested lateral scoot as another way to do transfers, and Mr. Wolak opted to try it; performed lateral scoot transfer recliner back to bed with min assist of 2 persons; Good weight shifting and Use of UEs for scooting; Continue to recommend comprehensive inpatient rehab (CIR) for post-acute therapy needs.    Follow Up Recommendations  CIR     Equipment Recommendations  None recommended by PT    Recommendations for Other Services       Precautions / Restrictions Precautions Precautions: Fall;Cervical Precaution Comments: ACDF on 4/12, pt reports they d/c-ed collar 1 week after surgery Restrictions Weight Bearing Restrictions: No    Mobility  Bed Mobility Overal bed mobility: Needs Assistance Bed Mobility: Sit to Sidelying   Sidelying to sit: Mod assist Supine to sit: Min guard;HOB elevated Sit to supine: Supervision;HOB elevated   General bed mobility comments: Light mod assist to help LEs into bed  Transfers Overall transfer level: Needs assistance Equipment used: Rolling walker (2 wheeled) Transfers: Lateral/Scoot Transfers Sit to Stand: +2 physical assistance;Min assist        Lateral/Scoot Transfers: Min assist;+2 physical assistance;+2  safety/equipment General transfer comment: Performed lateral scoot transfer towards his R side ot get back to bed; Good use of UEs for scooting and good weight shift; used bed pad to assist  Ambulation/Gait Ambulation/Gait assistance: Mod assist;Min assist;+2 physical assistance;+2 safety/equipment Gait Distance (Feet): 16 Feet Assistive device: Rolling walker (2 wheeled) Gait Pattern/deviations: Decreased step length - right;Decreased step length - left;Trunk flexed Gait velocity: significantly decreased   General Gait Details: pt with very slow, cautious and guarded gait; frequent cueing and mod assist for safe use/advancement of RW;  low back pain while taking steps, applied manual pressure to L low back, and he indicated it did help with pain   Stairs             Wheelchair Mobility    Modified Rankin (Stroke Patients Only)       Balance Overall balance assessment: Needs assistance Sitting-balance support: Feet supported;No upper extremity supported Sitting balance-Leahy Scale: Fair     Standing balance support: During functional activity;Bilateral upper extremity supported Standing balance-Leahy Scale: Poor Standing balance comment: heavy reliance on RW with BUEs.                             Cognition Arousal/Alertness: Awake/alert Behavior During Therapy: WFL for tasks assessed/performed Overall Cognitive Status: Within Functional Limits for tasks assessed                                        Exercises Other Exercises Other  Exercises: Alternating heelslides in bed x5 Other Exercises: Gentle trunk rotation in sitting    General Comments General comments (skin integrity, edema, etc.): Pt's wife present      Pertinent Vitals/Pain Pain Assessment: Faces Faces Pain Scale: Hurts little more Pain Location: Back and bilateral knees, left knee more than right; Overall back pain is the worst Pain Descriptors / Indicators:  Cramping;Discomfort Pain Intervention(s): Monitored during session    Home Living                      Prior Function            PT Goals (current goals can now be found in the care plan section) Acute Rehab PT Goals Patient Stated Goal: go home, feel stronger PT Goal Formulation: With patient Time For Goal Achievement: 04/07/20 Potential to Achieve Goals: Good Progress towards PT goals: Progressing toward goals    Frequency    Min 3X/week      PT Plan Current plan remains appropriate    Co-evaluation PT/OT/SLP Co-Evaluation/Treatment: Yes Reason for Co-Treatment: Complexity of the patient's impairments (multi-system involvement);For patient/therapist safety PT goals addressed during session: Mobility/safety with mobility OT goals addressed during session: ADL's and self-care;Proper use of Adaptive equipment and DME      AM-PAC PT "6 Clicks" Mobility   Outcome Measure  Help needed turning from your back to your side while in a flat bed without using bedrails?: None Help needed moving from lying on your back to sitting on the side of a flat bed without using bedrails?: None Help needed moving to and from a bed to a chair (including a wheelchair)?: A Lot Help needed standing up from a chair using your arms (e.g., wheelchair or bedside chair)?: A Lot Help needed to walk in hospital room?: A Lot Help needed climbing 3-5 steps with a railing? : Total 6 Click Score: 15    End of Session Equipment Utilized During Treatment: Other (comment)(bed pad) Activity Tolerance: Patient tolerated treatment well Patient left: in bed;with call bell/phone within reach;with family/visitor present Nurse Communication: Mobility status PT Visit Diagnosis: Muscle weakness (generalized) (M62.81);Difficulty in walking, not elsewhere classified (R26.2)     Time: [id number] PT Time Calculation (min) (ACUTE ONLY): 13 min  Charges:  $Gait Training: 8-22 mins $Therapeutic Activity:  8-22 mins                     Roney Marion, PT  Acute Rehabilitation Services Pager [phone] Office [place] 03/31/2020, 2:58 PM

## 2020-04-01 DIAGNOSIS — M00262 Other streptococcal arthritis, left knee: Secondary | ICD-10-CM

## 2020-04-01 LAB — COMPREHENSIVE METABOLIC PANEL
ALT: 118 U/L — ABNORMAL HIGH (ref 0–44)
AST: 99 U/L — ABNORMAL HIGH (ref 15–41)
Albumin: 1.8 g/dL — ABNORMAL LOW (ref 3.5–5.0)
Alkaline Phosphatase: 79 U/L (ref 38–126)
Anion gap: 9 (ref 5–15)
BUN: 17 mg/dL (ref 8–23)
CO2: 26 mmol/L (ref 22–32)
Calcium: 8.6 mg/dL — ABNORMAL LOW (ref 8.9–10.3)
Chloride: 101 mmol/L (ref 98–111)
Creatinine, Ser: 1.37 mg/dL — ABNORMAL HIGH (ref 0.61–1.24)
GFR calc Af Amer: >60 mL/min (ref >60)
GFR calc non Af Amer: 53 mL/min — ABNORMAL LOW (ref >60)
Glucose, Bld: 151 mg/dL — ABNORMAL HIGH (ref 70–99)
Potassium: 4 mmol/L (ref 3.5–5.1)
Sodium: 136 mmol/L (ref 135–145)
Total Bilirubin: 1.4 mg/dL — ABNORMAL HIGH (ref 0.3–1.2)
Total Protein: 5.6 g/dL — ABNORMAL LOW (ref 6.5–8.1)

## 2020-04-01 LAB — CBC WITH DIFFERENTIAL/PLATELET
Abs Immature Granulocytes: 0.07 10*3/uL (ref 0.00–0.07)
Basophils Absolute: 0 10*3/uL (ref 0.0–0.1)
Basophils Relative: 0 %
Eosinophils Absolute: 0.1 10*3/uL (ref 0.0–0.5)
Eosinophils Relative: 1 %
HCT: 23.6 % — ABNORMAL LOW (ref 39.0–52.0)
Hemoglobin: 7.5 g/dL — ABNORMAL LOW (ref 13.0–17.0)
Immature Granulocytes: 1 %
Lymphocytes Relative: 11 %
Lymphs Abs: 1 10*3/uL (ref 0.7–4.0)
MCH: 30.6 pg (ref 26.0–34.0)
MCHC: 31.8 g/dL (ref 30.0–36.0)
MCV: 96.3 fL (ref 80.0–100.0)
Monocytes Absolute: 0.7 10*3/uL (ref 0.1–1.0)
Monocytes Relative: 7 %
Neutro Abs: 7.5 10*3/uL (ref 1.7–7.7)
Neutrophils Relative %: 80 %
Platelets: 251 10*3/uL (ref 150–400)
RBC: 2.45 MIL/uL — ABNORMAL LOW (ref 4.22–5.81)
RDW: 15.9 % — ABNORMAL HIGH (ref 11.5–15.5)
WBC: 9.4 10*3/uL (ref 4.0–10.5)
nRBC: 0 % (ref 0.0–0.2)

## 2020-04-01 LAB — GLUCOSE, CAPILLARY
Glucose-Capillary: 130 mg/dL — ABNORMAL HIGH (ref 70–99)
Glucose-Capillary: 276 mg/dL — ABNORMAL HIGH (ref 70–99)
Glucose-Capillary: 75 mg/dL (ref 70–99)
Glucose-Capillary: 162 mg/dL — ABNORMAL HIGH (ref 70–99)

## 2020-04-01 MED ORDER — PRO-STAT SUGAR FREE PO LIQD
30.0000 mL | Freq: Two times a day (BID) | ORAL | Status: DC
  Administered 2020-04-01 – 2020-04-06 (×5): 30 mL via ORAL
  Filled 2020-04-01 (×10): qty 30

## 2020-04-01 NOTE — Progress Notes (Signed)
PROGRESS NOTE    Johnathan Murray  [id number] DOB: [birth year] DOA: 03/19/2020 PCP: Nicoletta Dress, MD    Brief Narrative:  69 y.o.malewithhistory of paroxysmal atrial fibrillation, hypertension, bioprosthetic aortic valve replacement who had undergone recent cervical discectomy and fusion about 3 weeks ago done by Dr. Trenton Gammon has been feeling weak with increasing pain in the neck and low back and poor appetite. Denies any vomiting or diarrhea denies taking any Tylenol but does take NSAIDs sometimes. Has not noticed any blood in the urine and has been urinating less last few days. Has subjective feeling of fever chills. Given the symptoms patient presents to the ER. Patient states over the last 3 weeks and surgery patient has been finding it difficult to ambulate because of the weakness of the lower extremity. Denies any incontinence of urine or bowel. Has been having more lower extremity edema more on the left side. In the ER on exam patient appears generally weak with weakness of the lower extremity. There is edema of the lower extremity on the left side. Labs show creatinine of 6.4 which is increased from 1.6 about 3 weeks ago. In addition patient's LFTs are markedly elevated at AST of 415 and ALT of 429 patient's albumin is 2.6 total bilirubin 5.5 bicarb 19 sodium 130 calcium 10.2 hemoglobin 10.4 lactic acid 1.6 WBC 12.6 platelets 341 urinalysis shows RBCs of 21-50 WBC 21-50 protein around 100 bacteria rare hyaline cast present mucus present. Patient was given fluid bolus and admitted for acute renal failure. Patient also had CT abdomen pelvis and CT cervical spine which did not show any acute. USS of the abdomen also was unremarkable. Covid test is negative  Assessment & Plan:   Principal Problem:   Enterococcal bacteremia Active Problems:   Essential hypertension   PAF (paroxysmal atrial fibrillation) (HCC)   Abnormal LFTs (liver function tests)   Cervical myelopathy  (HCC)   S/P aortic valve replacement   ARF (acute renal failure) (HCC)   Pseudogout of left knee   Septic arthritis (HCC)   History of fusion of cervical spine   Protein-calorie malnutrition, severe   AKI on CKD stage III/anion gap metabolic acidosis Stable Multifactorial from bacteremia, poor oral intake, in addition to medication induced from NSAID, Lasix and lisinopril use Baseline creatinine 1.3-1.6, on presentation 6.40 Noted urine with RBCs and protein-further work-up Per nephrology IVF now stopped Held patient's Lasix, lisinopril CT abdomen does not show any obstruction Nephrology consulted earlier, since signed off Daily BMP  Sepsis likely 2/2 Enterococcus faecalis bacteremia On presentation, patient was initially hypotensive, with leukocytosis Source from possible knee infection vs recent postoperative cervical/lumbar infection BC x2 growing Enterococcus faecalis, repeat BC x2 NGTD UC no growth, Chest x-ray unremarkable Lactic acid, TSH WNL, cortisol WNL TTE showed no obvious vegetation but increased transaortic gradients across the bioprosthetic valve  Cardiology was consulted for TEE. Pt underwent TEE 5/6. Valves unremarkable, no vegetation seen. Discussed with Cardiology ID recs noted. Plan to complete ampicillin for at least 4 weeks with outpt f/u with ID on 04/16/20.  ID has signed off on 5/7 PICC placed 5/9  Likely LLE septic knee with ruptured Baker's cyst with possibly pseudogout Doppler showed ruptured Baker's cyst on LLE Left knee x-ray showed large joint effusion, septic joint cannot be excluded Orthopedics consulted s/p LLE joint aspiration on 03/21/2020 and RLE joint aspiration on 03/22/2020 both showed positive intracellular calcium pyrophosphate crystals, WBC over 15,000, culture grew rare E faecalis from LLE, culture from RLE without  organisms Status post steroid injection in both knees on 03/22/2020 by orthopedics Now s/p I&D of bilateral knees on 03/24/2020,  stable at this time Pt agreeable for CIR, pending bed availability  Hyperglycemia CBGs have been stable Likely 2/2 recent steroid injection in the knee Continue with SSI, Accu-Cheks, hypoglycemic protocol  Transaminitis Suspect secondary to above sepsis/hypotension/shock liver INR WNL Acute hepatitis panel nonreactive Home amiodarone currently on hold LFT's had been trending towards normal  Paroxysmal A. Fib/bioprosthetic aortic valve replacement Heart rate WNL Holding home amiodarone TTE as noted above Continue Eliquis  Neck and back pain History of recent cervical decompression surgery Imaging does not show anything acute as mentioned above MRI cervical spine with no evidence of infection PT/OT consulted, recs noted for CIR. Pending placement and bed availability  Normocytic anemia/postop anemia Anemia panel iron 43, sats 29, ferritin 3,811 Continued on oral iron supplementation Hgb down to 6.8 recently s/p 2 units PRBC's No evidence of acute blood loss Hgb has been downtrending, while on eliquis FOBT pending  Hypothyroidism TSH levels WNL Continue Synthroid as pt tolerates     DVT prophylaxis: Eliquis Code Status: Full Family Communication: Pt in room, wife at bedside  Status is: Inpatient  Remains inpatient appropriate because:Ongoing diagnostic testing needed not appropriate for outpatient work up and Unsafe d/c plan, need to confirm no active bleed   Dispo: The patient is from: Home              Anticipated d/c is to: CIR              Anticipated d/c date is: 2 days, awaiting CIR placement/bed availability              Patient currently is not medically stable to d/c.  Consultants:   ID  Orthopedic Surgery  Critical Care  Nephrology  Procedures:   TEE 5/6  Antimicrobials: Anti-infectives (From admission, onward)   Start     Dose/Rate Route Frequency Ordered Stop   03/27/20 0900  cefTRIAXone (ROCEPHIN) 2 g in sodium chloride 0.9 %  100 mL IVPB  Status:  Discontinued     2 g 200 mL/hr over 30 Minutes Intravenous Every 12 hours 03/27/20 0828 03/27/20 0901   03/22/20 1515  cefTRIAXone (ROCEPHIN) 2 g in sodium chloride 0.9 % 100 mL IVPB  Status:  Discontinued     2 g 200 mL/hr over 30 Minutes Intravenous Every 12 hours 03/22/20 1442 03/26/20 1043   03/22/20 1000  ampicillin (OMNIPEN) 2 g in sodium chloride 0.9 % 100 mL IVPB     2 g 300 mL/hr over 20 Minutes Intravenous Every 6 hours 03/22/20 0925 04/21/20 2359   03/21/20 1800  ceFEPIme (MAXIPIME) 1 g in sodium chloride 0.9 % 100 mL IVPB  Status:  Discontinued     1 g 200 mL/hr over 30 Minutes Intravenous Every 24 hours 03/20/20 1143 03/21/20 0116   03/21/20 0200  ampicillin (OMNIPEN) 2 g in sodium chloride 0.9 % 100 mL IVPB  Status:  Discontinued     2 g 300 mL/hr over 20 Minutes Intravenous Every 8 hours 03/21/20 0116 03/22/20 0925   03/20/20 1230  ceFEPIme (MAXIPIME) 1 g in sodium chloride 0.9 % 100 mL IVPB     1 g 200 mL/hr over 30 Minutes Intravenous  Once 03/20/20 1143 03/20/20 1358      Subjective: Still complaining of chronic back pain as well as right knee pain  Objective: Vitals:   03/31/20 2155 04/01/20 0622 04/01/20 [id number]  04/01/20 1712  BP: 115/63 130/76 101/64 117/72  Pulse: 89 74 (!) 106 92  Resp: 18 18 18 18   Temp: 98.8 F (37.1 C) 98 F (36.7 C) 98.3 F (36.8 C) 99.8 F (37.7 C)  TempSrc: Oral  Oral Oral  SpO2: 99% 99% 100% 99%  Weight:      Height:        Intake/Output Summary (Last 24 hours) at 04/01/2020 1724 Last data filed at 04/01/2020 0900 Gross per 24 hour  Intake 830.51 ml  Output 800 ml  Net 30.51 ml   Filed Weights   03/28/20 2044 03/29/20 0500 03/30/20 2135  Weight: 79 kg 79 kg 78 kg    Examination:  General: NAD   Cardiovascular: S1, S2 present  Respiratory: CTAB  Abdomen: Soft, nontender, nondistended, bowel sounds present  Musculoskeletal: No bilateral pedal edema noted, bilateral knee incisions  healing  Skin: Normal  Psychiatry: Normal mood   Data Reviewed: I have personally reviewed following labs and imaging studies  CBC: Recent Labs  Lab 03/28/20 0532 03/28/20 0532 03/29/20 0444 03/30/20 0303 03/31/20 0254 03/31/20 1702 04/01/20 0430  WBC 18.6*   < > 17.4* 12.4* 10.9* 12.8* 9.4  NEUTROABS 15.9*  --  14.6* 10.2* 8.8*  --  7.5  HGB 8.8*   < > 8.8* 8.3* 7.7* 8.3* 7.5*  HCT 27.0*   < > 27.9* 25.8* 24.5* 26.0* 23.6*  MCV 93.1   < > 95.9 95.9 95.3 95.6 96.3  PLT 270   < > 274 259 244 270 251   < > = values in this interval not displayed.   Basic Metabolic Panel: Recent Labs  Lab 03/28/20 0532 03/29/20 0444 03/30/20 0303 03/31/20 0254 04/01/20 0430  NA 136 138 136 136 136  K 3.8 4.1 3.7 4.1 4.0  CL 101 103 103 101 101  CO2 25 24 25 25 26   GLUCOSE 138* 109* 177* 185* 151*  BUN 17 16 17 17 17   CREATININE 1.45* 1.36* 1.45* 1.36* 1.37*  CALCIUM 8.7* 8.8* 8.6* 8.6* 8.6*   GFR: Estimated Creatinine Clearance: 54 mL/min (A) (by C-G formula based on SCr of 1.37 mg/dL (H)). Liver Function Tests: Recent Labs  Lab 03/27/20 0737 03/28/20 0532 03/29/20 0444 03/30/20 0303 04/01/20 0430  AST 24 25 38 49* 99*  ALT 86* 73* 72* 77* 118*  ALKPHOS 82 75 65 76 79  BILITOT 1.7* 1.8* 1.7* 1.7* 1.4*  PROT 5.9* 5.7* 5.9* 5.7* 5.6*  ALBUMIN 2.0* 1.9* 2.0* 2.0* 1.8*   No results for input(s): LIPASE, AMYLASE in the last 168 hours. No results for input(s): AMMONIA in the last 168 hours. Coagulation Profile: No results for input(s): INR, PROTIME in the last 168 hours. Cardiac Enzymes: No results for input(s): CKTOTAL, CKMB, CKMBINDEX, TROPONINI in the last 168 hours. BNP (last 3 results) No results for input(s): PROBNP in the last 8760 hours. HbA1C: No results for input(s): HGBA1C in the last 72 hours. CBG: Recent Labs  Lab 03/31/20 1617 03/31/20 2155 04/01/20 0622 04/01/20 1117 04/01/20 1616  GLUCAP 79 143* 130* 276* 75   Lipid Profile: No results for  input(s): CHOL, HDL, LDLCALC, TRIG, CHOLHDL, LDLDIRECT in the last 72 hours. Thyroid Function Tests: No results for input(s): TSH, T4TOTAL, FREET4, T3FREE, THYROIDAB in the last 72 hours. Anemia Panel: No results for input(s): VITAMINB12, FOLATE, FERRITIN, TIBC, IRON, RETICCTPCT in the last 72 hours. Sepsis Labs: No results for input(s): PROCALCITON, LATICACIDVEN in the last 168 hours.  Recent Results (from the past  240 hour(s))  Gram stain     Status: None   Collection Time: 03/22/20  8:36 PM   Specimen: Synovium; Body Fluid  Result Value Ref Range Status   Specimen Description SYNOVIAL RIGHT KNEE  Final   Special Requests NONE  Final   Gram Stain   Final    ABUNDANT WBC PRESENT, PREDOMINANTLY PMN NO ORGANISMS SEEN Performed at [facility], [address], [place], [place] [address]    Report Status 03/22/2020 FINAL  Final  Culture, body fluid-bottle     Status: None   Collection Time: 03/22/20  8:42 PM   Specimen: Synovium  Result Value Ref Range Status   Specimen Description SYNOVIAL RIGHT KNEE  Final   Special Requests   Final    BOTTLES DRAWN AEROBIC ONLY Blood Culture adequate volume   Culture   Final    NO GROWTH 5 DAYS Performed at [facility], [place] [address], [place], [place] [address]    Report Status 03/27/2020 FINAL  Final  Culture, blood (routine x 2)     Status: None   Collection Time: 03/23/20  5:12 PM   Specimen: BLOOD  Result Value Ref Range Status   Specimen Description BLOOD LEFT ANTECUBITAL  Final   Special Requests   Final    BOTTLES DRAWN AEROBIC ONLY Blood Culture results may not be optimal due to an inadequate volume of blood received in culture bottles   Culture   Final    NO GROWTH 5 DAYS Performed at [facility], [place] [address] [place], [place] [address]    Report Status 03/28/2020 FINAL  Final  Culture, blood (routine x 2)     Status: None   Collection Time: 03/23/20  5:12 PM   Specimen: BLOOD RIGHT HAND  Result Value  Ref Range Status   Specimen Description BLOOD RIGHT HAND  Final   Special Requests   Final    BOTTLES DRAWN AEROBIC ONLY Blood Culture adequate volume   Culture   Final    NO GROWTH 5 DAYS Performed at [facility], [place] [address], [place], [place] [address]    Report Status 03/28/2020 FINAL  Final  Surgical pcr screen     Status: None   Collection Time: 03/24/20  2:21 AM   Specimen: Nasal Mucosa; Nasal Swab  Result Value Ref Range Status   MRSA, PCR NEGATIVE NEGATIVE Final   Staphylococcus aureus NEGATIVE NEGATIVE Final    Comment: (NOTE) The Xpert SA Assay (FDA approved for NASAL specimens in patients 59 years of age and older), is one component of a comprehensive surveillance program. It is not intended to diagnose infection nor to guide or monitor treatment. Performed at [facility], [place] [address] [place], [place] [address]   Culture, blood (Routine X 2) w Reflex to ID Panel     Status: None   Collection Time: 03/26/20  9:15 PM   Specimen: BLOOD  Result Value Ref Range Status   Specimen Description BLOOD RIGHT ARM  Final   Special Requests   Final    BOTTLES DRAWN AEROBIC AND ANAEROBIC Blood Culture results may not be optimal due to an excessive volume of blood received in culture bottles   Culture   Final    NO GROWTH 5 DAYS Performed at [facility], [place] [address] [place],  [address]    Report Status 03/31/2020 FINAL  Final  Culture, blood (Routine X 2) w Reflex  to ID Panel     Status: None   Collection Time: 03/26/20  9:25 PM   Specimen: BLOOD  Result Value Ref Range Status   Specimen Description BLOOD LEFT FOREARM  Final   Special Requests   Final    BOTTLES DRAWN AEROBIC AND ANAEROBIC Blood Culture results may not be optimal due to an excessive volume of blood received in culture bottles   Culture   Final    NO GROWTH 5 DAYS Performed at [facility], [place] [address] [place], [place] [address]    Report Status 03/31/2020 FINAL   Final     Radiology Studies: No results found.  Scheduled Meds: . sodium chloride   Intravenous Once  . apixaban  5 mg Oral BID  . Chlorhexidine Gluconate Cloth  6 each Topical Daily  . docusate sodium  100 mg Oral BID  . feeding supplement (ENSURE ENLIVE)  237 mL Oral TID BM  . feeding supplement (PRO-STAT SUGAR FREE 64)  30 mL Oral BID  . insulin aspart  0-15 Units Subcutaneous TID WC  . insulin aspart  0-5 Units Subcutaneous QHS  . insulin aspart  3 Units Subcutaneous TID WC  . insulin glargine  10 Units Subcutaneous BID  . iron polysaccharides  150 mg Oral Q breakfast  . levothyroxine  25 mcg Oral Q0600  . midodrine  10 mg Oral TID WC  . senna  1 tablet Oral BID  . sodium chloride flush  10-40 mL Intracatheter Q12H   Continuous Infusions: . sodium chloride 250 mL (04/01/20 0106)  . ampicillin (OMNIPEN) IV 2 g (04/01/20 1722)     LOS: 12 days   Alma Friendly, MD [facility] Pager On Amion  If 7PM-7AM, please contact night-coverage 04/01/2020, 5:24 PM

## 2020-04-01 NOTE — Progress Notes (Signed)
Physical Therapy Treatment Patient Details Name: Ladarien Braam MRN: [id number] DOB: 10/29/1951 Today's Date: 04/01/2020    History of Present Illness Pt is a 69 y/o male s/p ACDF C3-4 on 4/12, admitted to [facility] on 4/30 for progressive weakness, fatigue, bilateral knee pain L>R. Pt with AKI, bacteremia, L septic knee with plan for I&D bilateral knees on 5/4 pm . PMHx includes previous ACDF C4-6 (04/2019), lumbar sx (05/2019), CAD, DM, HTN, hx of CABGNow s/p I&D of bilateral knees on 03/24/2020    PT Comments    Continuing work on functional mobility and activity tolerance;  Session focused on trying soft tissue mobilization techniques at lumbar musculature, L piriformis, and R hamstrings, with the hopes of pt finding relief from the pain that is limiting his standing and walking so much; If his knee can tolerate, consider teaching bilateral figure four stretching next session   Follow Up Recommendations  CIR     Equipment Recommendations  None recommended by PT    Recommendations for Other Services       Precautions / Restrictions Precautions Precautions: Fall;Cervical Precaution Comments: ACDF on 4/12, pt reports they d/c-ed collar 1 week after surgery    Mobility  Bed Mobility Overal bed mobility: Needs Assistance Bed Mobility: Sit to Sidelying         Sit to sidelying: Mod assist General bed mobility comments: Light mod assist to help LEs into bed  Transfers Overall transfer level: Needs assistance Equipment used: Rolling walker (2 wheeled) Transfers: Sit to/from Stand Sit to Stand: +2 physical assistance;Mod assist         General transfer comment: Stood from low recliner seat with use of armrests and Assist from this PT and his wife  Ambulation/Gait Ambulation/Gait assistance: Mod assist;+2 safety/equipment Gait Distance (Feet): (turning step to face teh bed) Assistive device: Rolling walker (2 wheeled)       General Gait Details: pt with very slow,  cautious and guarded gait; frequent cueing and mod assist for safe use/advancement of RW;  low back pain while taking steps, applied manual pressure to L low back, and he indicated it did help with pain   Stairs             Wheelchair Mobility    Modified Rankin (Stroke Patients Only)       Balance                                            Cognition Arousal/Alertness: Awake/alert Behavior During Therapy: WFL for tasks assessed/performed Overall Cognitive Status: Within Functional Limits for tasks assessed                                 General Comments: He seems generally with low mood due to the amount of pain he is in      Exercises      General Comments General comments (skin integrity, edema, etc.): Positioned pt facing the bed; bed raised to his waist level, and he laid his upper body semi-prone, with upper body supported on teh bed; Performed soft tissue mobilization techniques to L piriformis, and R hamstrings, as well as lumbar musculature; he reports being able to relax in that position      Pertinent Vitals/Pain Pain Assessment: Faces Faces Pain Scale: Hurts little more Pain Location: Back and  bilateral knees, left knee more than right; Overall back pain is the worst Pain Descriptors / Indicators: Cramping;Discomfort Pain Intervention(s): Monitored during session;Other (comment);Heat applied(Tried soft tissue mobilization techniques)    Home Living                      Prior Function Level of Independence: Independent          PT Goals (current goals can now be found in the care plan section) Acute Rehab PT Goals Patient Stated Goal: go home, feel stronger PT Goal Formulation: With patient Time For Goal Achievement: 04/07/20 Potential to Achieve Goals: Good Progress towards PT goals: Progressing toward goals(slowly)    Frequency    Min 3X/week      PT Plan Current plan remains appropriate     Co-evaluation              AM-PAC PT "6 Clicks" Mobility   Outcome Measure  Help needed turning from your back to your side while in a flat bed without using bedrails?: None Help needed moving from lying on your back to sitting on the side of a flat bed without using bedrails?: None Help needed moving to and from a bed to a chair (including a wheelchair)?: A Lot Help needed standing up from a chair using your arms (e.g., wheelchair or bedside chair)?: A Lot Help needed to walk in hospital room?: A Lot Help needed climbing 3-5 steps with a railing? : Total 6 Click Score: 15    End of Session Equipment Utilized During Treatment: Gait belt Activity Tolerance: Patient tolerated treatment well Patient left: in bed;with call bell/phone within reach;with family/visitor present Nurse Communication: Mobility status PT Visit Diagnosis: Muscle weakness (generalized) (M62.81);Difficulty in walking, not elsewhere classified (R26.2)     Time: 1230-1300 PT Time Calculation (min) (ACUTE ONLY): 30 min  Charges:  $Therapeutic Activity: 23-37 mins                     Roney Marion, PT  Acute Rehabilitation Services Pager [phone] Office [place] 04/01/2020, 3:12 PM

## 2020-04-01 NOTE — Progress Notes (Signed)
Nutrition Follow-up  DOCUMENTATION CODES:   Severe malnutrition in context of acute illness/injury  INTERVENTION:  27ml Pro-stat BID po, each supplement provides 100 kcal and 15 grams protein  Magic cup TID with meals, each supplement provides 290 kcal and 9 grams of protein  Continue Ensure Enlive po TID, each supplement provides 350 kcal and 20 grams of protein   NUTRITION DIAGNOSIS:   Severe Malnutrition related to acute illness as evidenced by energy intake < or equal to 50% for > or equal to 5 days, percent weight loss(6% weight loss within 1 month).  Ongoing.  GOAL:   Patient will meet greater than or equal to 90% of their needs  Progressing.  MONITOR:   PO intake, Supplement acceptance, Labs  REASON FOR ASSESSMENT:   Consult Assessment of nutrition requirement/status, Poor PO  ASSESSMENT:   69 yo male admitted with ruptured LLE Baker's cyst, enterococcal bacteremia, possible AV endocarditis, AKI. S/P aspiration of L knee. PMH includes CKD-IIIa, PAF, HTN, biprosthetic aortic valve replacement, recent cervical discectomy and fusion.  Pt has enterococcal bacteremia and septic arthritis of his L knee.   5/4 - s/p bilateral knee arthroscopy irrigation and debridement; bilateral knee medial and lateral meisectomies 5/6 - TEE   RN in room at time of visit.   Pt pending CIR placement.   Pt reports poor appetite due to pain and disliking the food options. Pt is consuming Ensure well (currently receiving TID) and is agreeable to [employer] and Pro-stat.   Pt's meals are documented as 50-100%; however, pt's wife informed me that pt is eating 50% (at most) and that she is finishing his meals.   UOP: 1,887ml x24 hours I/O: -5,572.75ml since admit  Labs: [id number] Medications reviewed and include: Colace, Novolog, Lantus, Senokot  Diet Order:   Diet Order            Diet Carb Modified Fluid consistency: Thin; Room service appropriate? Yes  Diet effective now               EDUCATION NEEDS:   Not appropriate for education at this time  Skin:  Skin Assessment: Skin Integrity Issues: Skin Integrity Issues:: Incisions Incisions: cervical, R/L leg  Last BM:  5/12  Height:   Ht Readings from Last 1 Encounters:  03/26/20 5\' 10"  (1.778 m)    Weight:   Wt Readings from Last 1 Encounters:  03/30/20 78 kg    BMI:  Body mass index is 24.67 kg/m.  Estimated Nutritional Needs:   Kcal:  1950-2150  Protein:  105-125 gm  Fluid:  >/= 2 L    Larkin Ina, MS, RD, LDN RD pager number and weekend/on-call pager number located in [place].

## 2020-04-01 NOTE — Progress Notes (Signed)
Inpatient Rehab Admissions Coordinator:   I have no beds available for this patient to admit to CIR today.  Will continue to follow for timing of potential admission pending bed availability.   Shann Medal, PT, DPT Admissions Coordinator [phone] 04/01/20  10:49 AM

## 2020-04-01 NOTE — PMR Pre-admission (Signed)
PMR Admission Coordinator Pre-Admission Assessment  Patient: Johnathan Murray is an 69 y.o., male MRN: [id number] DOB: 01/16/1951 Height: '5\' 10"'$  (177.8 cm) Weight: 82.5 kg  Insurance Information HMO:     PPO:      PCP:      IPA:      80/20:      OTHER:  PRIMARY: Medicare Part A only      Policy#: [id number]      Subscriber: pt CM Name:       Phone#:      Fax#:  Pre-Cert#: verified Civil engineer, contracting:  Benefits:  Phone #:      Name:  Eff. Date: Part A 04/21/17     Deduct: $1484      Out of Pocket Max: n/a      Life Max: n/a CIR: 100% per Medicare guidelines     SNF: 20 full days Outpatient:      Co-Pay:  Home Health: 100%      Co-Pay:  DME:      Co-Pay:  Providers: pt choice SECONDARY: [employer] [place]      Policy#: [id number]     Phone#: [phone]  Financial Counselor:       Phone#:   The "Data Collection Information Summary" for patients in Inpatient Rehabilitation Facilities with attached "Privacy Act [place] Records" was provided and verbally reviewed with: Patient and Family  Emergency Contact Information Contact Information    Name Relation Home Work Mobile   Hoecker,[name] Spouse [phone]  [phone]      Current Medical History  Patient Admitting Diagnosis: debility, bactremia sepsis  History of Present Illness: Johnathan Murray is a 69 year old right-handed male with history of diabetes mellitus, PAF maintained on amiodarone as well as Eliquis, hypertension, bioprosthetic aortic valve replacement 2017, lumbar laminectomy decompression 06/11/2019 as well as recent C3-4 anterior cervical discectomy with interbody fusion for cervical stenosis myelopathy 03/02/2020 per [name].  Presented 03/19/2020 with low-grade fever and weakness lower extremities.  Lactic acid 1.6, sodium 130, BUN 98, creatinine 6.40 with latest prior creatinine 1.63 02/24/2020, WBC 12,600, hemoglobin 10.4, CK 20, urinalysis showed RBCs of 21-50 WBC 21-50 protein around 100 bacteria rare  hyaline cast, SARS coronavirus negative.  His LFTs were markedly elevated AST 415 ALT 429 total bilirubin 5.5.  CT abdomen pelvis as well as CT cervical spine unremarkable.  MRI cervical spine negative for infection.  Patient was hypotensive and received fluid bolus.  Placed on broad spectrum antibiotics for suspected sepsis.  MRI lumbar spine findings consistent with acute osteomyelitis discitis at L4-5.  Evidence for associated soft tissue infection cellulitis within the psoas musculature bilaterally with a few small superimposed abscesses.  Additional multilevel degenerative spondylosis most pronounced at L3-4.  Neurosurgery consulted no plan for surgical intervention and underwent aspiration of disc space L4-5 with results pending.   TEE showed no obvious vegetation and follow-up per cardiology services.  Orthopedic services consulted Dr. Mardelle Matte for bilateral knee pain with noted swelling of left knee.  X-ray showed diffuse degenerative changes as well as a large joint effusion.  Doppler showed ruptured Baker's cyst on left lower extremity.  Patient underwent left lower extremity joint aspiration 03/21/2020 and right lower extremity joint aspiration 03/22/2020 both showing intracellular calcium pyrophosphate crystals WBC was up to 15,000 culture grew rare E Faecalis.  Patient also did receive steroid injection of both knees.  Underwent I&D bilateral knees 03/24/2020.  Infectious disease continues to follow currently maintained on intravenous  ampicillin x7 to 8 weeks through 05/20/2020.  PICC line was placed 03/29/2020.  AKI on CKD stage III continues to improve multifactorial from bacteremia with latest creatinine 1.32.  Normocytic anemia/postoperative anemia patient was transfused 2 units of packed red blood cells latest hemoglobin 7.6.  Therapy evaluations completed and patient was admitted for a comprehensive rehab program.    Patient's [facility] record from [facility] has been reviewed by the rehabilitation  admission coordinator and physician.  Past Medical History  Past Medical History:  Diagnosis Date  . Abnormal LFTs (liver function tests) 10/31/2016  . Abnormal nuclear stress test 05/03/2018  . Acid reflux 02/15/2018   not at present  . Acute otitis externa of left ear 11/13/2019  . Acute suppurative otitis media of left ear without spontaneous rupture of tympanic membrane 11/13/2019  . Anemia    low iron  . Aortic stenosis 05/03/2018  . Arthritis 02/15/2018   Overview:  shoulder  . Cancer ([place])    skin cancer on left wrist  . Cervical myelopathy ([place]) 04/22/2019  . Conductive hearing loss of left ear 11/13/2019  . Coronary artery disease involving native coronary artery of native heart without angina pectoris 05/30/2016  . Coronary artery disease of native artery of native heart with stable angina pectoris ([place])    pt. denies  . Diabetes mellitus due to underlying condition with unspecified complications ([place]) 9/60/4540  . Dyslipidemia 05/30/2016  . Dyspnea    upon exertion,  as of 06/05/09 not having any recent chest pain  . Dysrhythmia    a fib  . Essential hypertension 05/30/2016  . Heart murmur    Has had an Aortic valve replacement  . Hyperactive gag reflex 02/15/2018  . Hypothyroidism   . Intermittent claudication ([place]) 10/31/2018  . Lumbar stenosis with neurogenic claudication 06/11/2019  . Macular degeneration 02/15/2018  . PAF (paroxysmal atrial fibrillation) ([place]) 05/30/2016  . Pre-operative cardiovascular examination 01/03/2020  . Preoperative cardiovascular examination 04/17/2019  . Pseudogout of left knee 03/21/2020  . Renal insufficiency 01/17/2020  . S/P aortic valve replacement 01/17/2020  . Tinnitus of left ear 11/13/2019    Family History   family history includes Brain cancer in his mother; Heart disease in his maternal grandmother; Prostate cancer in his brother.  Prior Rehab/Hospitalizations Has the patient had prior rehab or hospitalizations prior to admission?  Yes  Has the patient had major surgery during 100 days prior to admission? Yes   Current Medications  Current Facility-Administered Medications:  .  0.9 %  sodium chloride infusion, , Intravenous, PRN, Donne Hazel, MD, Last Rate: 10 mL/hr at 04/02/20 2230, 250 mL at 04/02/20 2230 .  acetaminophen (TYLENOL) tablet 650 mg, 650 mg, Oral, Q6H PRN, Donne Hazel, MD, 650 mg at 04/06/20 1738 .  ampicillin (OMNIPEN) 2 g in sodium chloride 0.9 % 100 mL IVPB, 2 g, Intravenous, Q6H, Alma Friendly, MD, Last Rate: 300 mL/hr at 04/07/20 0945, 2 g at 04/07/20 0945 .  bisacodyl (DULCOLAX) suppository 10 mg, 10 mg, Rectal, Daily PRN, Merlene Pulling K, PA-C .  Chlorhexidine Gluconate Cloth 2 % PADS 6 each, 6 each, Topical, Daily, Marchia Bond, MD, 6 each at 04/07/20 0941 .  cyclobenzaprine (FLEXERIL) tablet 10 mg, 10 mg, Oral, QHS PRN, Opyd, Ilene Qua, MD, 10 mg at 04/06/20 2132 .  diphenhydrAMINE (BENADRYL) 12.5 MG/5ML elixir 12.5-25 mg, 12.5-25 mg, Oral, Q4H PRN, Owens Shark, Blaine K, PA-C .  docusate sodium (COLACE) capsule 100 mg, 100 mg, Oral, BID, Owens Shark,  Blaine K, PA-C, 100 mg at 04/07/20 0940 .  feeding supplement (ENSURE ENLIVE) (ENSURE ENLIVE) liquid 237 mL, 237 mL, Oral, QID, Horris Latino, Adline Peals, MD .  fentaNYL (SUBLIMAZE) 100 MCG/2ML injection, , , ,  .  hydrALAZINE (APRESOLINE) injection 10 mg, 10 mg, Intravenous, Q4H PRN, Owens Shark, Blaine K, PA-C .  insulin aspart (novoLOG) injection 0-15 Units, 0-15 Units, Subcutaneous, TID WC, Ventura Bruns, PA-C, 3 Units at 04/07/20 1143 .  insulin aspart (novoLOG) injection 0-5 Units, 0-5 Units, Subcutaneous, QHS, Ventura Bruns, PA-C, 2 Units at 04/06/20 2135 .  insulin aspart (novoLOG) injection 3 Units, 3 Units, Subcutaneous, TID WC, Ventura Bruns, PA-C, 3 Units at 04/07/20 1143 .  insulin glargine (LANTUS) injection 10 Units, 10 Units, Subcutaneous, BID, Ventura Bruns, PA-C, 10 Units at 04/07/20 0940 .  iron polysaccharides (NIFEREX) capsule  150 mg, 150 mg, Oral, Q breakfast, Merlene Pulling K, PA-C, 150 mg at 04/07/20 0940 .  levothyroxine (SYNTHROID) tablet 25 mcg, 25 mcg, Oral, Q0600, Ventura Bruns, PA-C, 25 mcg at 04/07/20 0940 .  lidocaine (XYLOCAINE) 1 % (with pres) injection, , , ,  .  magnesium citrate solution 1 Bottle, 1 Bottle, Oral, Once PRN, Owens Shark, Blaine K, PA-C .  metoCLOPramide (REGLAN) tablet 5-10 mg, 5-10 mg, Oral, Q8H PRN **OR** metoCLOPramide (REGLAN) injection 5-10 mg, 5-10 mg, Intravenous, Q8H PRN, Owens Shark, Blaine K, PA-C .  metoprolol tartrate (LOPRESSOR) injection 5 mg, 5 mg, Intravenous, Once PRN, Owens Shark, Blaine K, PA-C .  midazolam (VERSED) 2 MG/2ML injection, , , ,  .  ondansetron (ZOFRAN) tablet 4 mg, 4 mg, Oral, Q6H PRN **OR** ondansetron (ZOFRAN) injection 4 mg, 4 mg, Intravenous, Q6H PRN, Owens Shark, Blaine K, PA-C .  oxyCODONE (Oxy IR/ROXICODONE) immediate release tablet 5-10 mg, 5-10 mg, Oral, Q4H PRN, Alma Friendly, MD, 10 mg at 04/07/20 1106 .  senna (SENOKOT) tablet 8.6 mg, 1 tablet, Oral, BID, Merlene Pulling K, PA-C, 8.6 mg at 04/07/20 0940 .  sodium chloride flush (NS) 0.9 % injection 10-40 mL, 10-40 mL, Intracatheter, Q12H, Marchia Bond, MD .  sodium chloride flush (NS) 0.9 % injection 10-40 mL, 10-40 mL, Intracatheter, PRN, Marchia Bond, MD .  traZODone (DESYREL) tablet 50 mg, 50 mg, Oral, QHS PRN, Alma Friendly, MD, 50 mg at 04/06/20 2132  Patients Current Diet:  Diet Order            Diet Carb Modified Fluid consistency: Thin; Room service appropriate? Yes  Diet effective now              Precautions / Restrictions Precautions Precautions: Fall, Cervical Precaution Comments: ACDF on 4/12, pt reports they d/c-ed collar 1 week after surgery Spinal Brace: Lumbar corset Other Brace: for comfort Restrictions Weight Bearing Restrictions: No   Has the patient had 2 or more falls or a fall with injury in the past year? No  Prior Activity Level Community (5-7x/wk): up until  ACDF in April was completely independent, no DME used at baseline, but recently using RW due to poor activity tolerance following sx  Prior Functional Level Self Care: Did the patient need help bathing, dressing, using the toilet or eating? Independent  Indoor Mobility: Did the patient need assistance with walking from room to room (with or without device)? Independent  Stairs: Did the patient need assistance with internal or external stairs (with or without device)? Independent  Functional Cognition: Did the patient need help planning regular tasks such as shopping or remembering to take medications? Independent  Home Assistive Devices / Equipment Home Assistive Devices/Equipment: Eyeglasses, Dentures (specify type) Home Equipment: Grab bars - tub/shower, Shower seat - built in, [place] - single point, Environmental consultant - 2 wheels  Prior Device Use: Indicate devices/aids used by the patient prior to current illness, exacerbation or injury? Walker and cane used following recent sx, but not prior to that  Current Functional Level Cognition  Overall Cognitive Status: Within Functional Limits for tasks assessed Orientation Level: Oriented to person, Oriented to place( April 2001, unsure why here, knew he was @ clinic/hosp) Safety/Judgement: Decreased awareness of safety, Decreased awareness of deficits General Comments: Generally unhappy with his progress    Extremity Assessment (includes Sensation/Coordination)  Upper Extremity Assessment: Generalized weakness  Lower Extremity Assessment: Defer to PT evaluation RLE Deficits / Details: formal MMT not assessed due to pt pain presentation, able to perform supine knee flexion to 90*, seated knee extension lacking 10* extension LLE Deficits / Details: formal MMT not assessed due to pt pain presentation, able to perform supine knee flexion to 45* VERY limited by pain, seated knee extension lacking 10* extension    ADLs  Overall ADL's : Needs  assistance/impaired Eating/Feeding: Independent Grooming: Wash/dry hands, Wash/dry face, Set up, Sitting, With caregiver independent assisting Upper Body Bathing: Set up, Supervision/ safety, Sitting, With caregiver independent assisting Lower Body Bathing: Maximal assistance Upper Body Dressing : Set up, Supervision/safety, Sitting, With caregiver independent assisting Lower Body Dressing: Minimal assistance, Sitting/lateral leans Lower Body Dressing Details (indicate cue type and reason): Pt able to don socks seated in recliner, flexing L knee up to chair Toilet Transfer: Minimal assistance, +2 for physical assistance, Cueing for safety, Cueing for sequencing, Ambulation, RW Toilet Transfer Details (indicate cue type and reason): simulated to recliner. Pt couldn't quite make it ambulating towards bathroom with RW due to B UEs fatiguing Toileting- Clothing Manipulation and Hygiene: Minimal assistance, Sit to/from stand Functional mobility during ADLs: Minimal assistance, +2 for physical assistance, Rolling walker, Cueing for safety, Cueing for sequencing General ADL Comments: Provided UE theraband and education, discussed CIR    Mobility  Overal bed mobility: Needs Assistance Bed Mobility: Sit to Sidelying Sidelying to sit: Mod assist Supine to sit: Min guard, HOB elevated Sit to supine: Supervision, HOB elevated Sit to sidelying: Mod assist General bed mobility comments: Mod assist to help LEs onto bed    Transfers  Overall transfer level: Needs assistance Equipment used: Rolling walker (2 wheeled) Transfers: Sit to/from Stand Sit to Stand: +2 physical assistance, Mod assist  Lateral/Scoot Transfers: Min assist, +2 physical assistance, +2 safety/equipment General transfer comment: stood from recliner with bilateral support; slow rise, painful    Ambulation / Gait / Stairs / Wheelchair Mobility  Ambulation/Gait Ambulation/Gait assistance: Min assist, +2 safety/equipment Gait  Distance (Feet): (pivotal steps recliner back to bed) Assistive device: Rolling walker (2 wheeled) Gait Pattern/deviations: Decreased step length - right, Decreased step length - left, Trunk flexed General Gait Details: Slow, small steps to turn to get to bed Gait velocity: significantly decreased    Posture / Balance Dynamic Sitting Balance Sitting balance - Comments: Perform bilateral UE exercises while seated  Balance Overall balance assessment: Needs assistance Sitting-balance support: Feet supported, No upper extremity supported Sitting balance-Leahy Scale: Fair Sitting balance - Comments: Perform bilateral UE exercises while seated  Standing balance support: During functional activity, Bilateral upper extremity supported Standing balance-Leahy Scale: Poor Standing balance comment: heavy reliance on RW with BUEs.     Special needs/care consideration Diabetic management yes and Designated  visitor [place] (from acute therapy documentation) Living Arrangements: Spouse/significant other Available Help at Discharge: Family, Available 24 hours/day Type of Home: House Home Layout: One level Home Access: Stairs to enter Entrance Stairs-Rails: Can reach both Entrance Stairs-Number of Steps: 7 Bathroom Shower/Tub: Multimedia programmer: Brave: No  Discharge Living Setting Plans for Discharge Living Setting: Patient's home Type of Home at Discharge: House Discharge Home Layout: One level Discharge Home Access: Stairs to enter Entrance Stairs-Rails: Right, Left Entrance Stairs-Number of Steps: 7 Discharge Bathroom Shower/Tub: Walk-in shower, Tub only Discharge Bathroom Toilet: Standard Discharge [place] Accessibility: Yes How Accessible: Accessible via walker Does the patient have any problems obtaining your medications?: No  Social/Family/Support Systems Patient Roles: Spouse Anticipated Caregiver: spouse  [place] Anticipated Ambulance person Information: [name] [phone] Ability/Limitations of Caregiver: supervision to min assist, will take FMLA but will eventually have to return to work Caregiver Availability: 24/7 Discharge Plan Discussed with Primary Caregiver: Yes Is Caregiver In Agreement with Plan?: Yes Does Caregiver/Family have Issues with Lodging/Transportation while Pt is in Rehab?: No  Goals Patient/Family Goal for Rehab: PT/OT supervision to mod I, SLP n/a Expected length of stay: 12-14 days Pt/Family Agrees to Admission and willing to participate: Yes Program Orientation Provided & Reviewed with Pt/Caregiver Including Roles  & Responsibilities: Yes  Decrease burden of Care through IP rehab admission: n/a  Possible need for SNF placement upon discharge: Not anticipated  Patient Condition: I have reviewed medical records from [facility], spoken with CM, and patient and spouse. I met with patient at the bedside for inpatient rehabilitation assessment.  Patient will benefit from ongoing PT and OT, can actively participate in 3 hours of therapy a day 5 days of the week, and can make measurable gains during the admission.  Patient will also benefit from the coordinated team approach during an Inpatient Acute Rehabilitation admission.  The patient will receive intensive therapy as well as Rehabilitation physician, nursing, social worker, and care management interventions.  Due to safety, disease management, medication administration, pain management and patient education the patient requires 24 hour a day rehabilitation nursing.  The patient is currently min to mod assist with mobility and basic ADLs.  Discharge setting and therapy post discharge at home with home health is anticipated.  Patient has agreed to participate in the Acute Inpatient Rehabilitation Program and will admit today.  Preadmission Screen Completed By:  Michel Santee PT, DPT, 04/07/2020 1:01  PM ______________________________________________________________________   Discussed status with Dr. Dagoberto Ligas on 04/07/20  at 1:02 PM  and received approval for admission today.  Admission Coordinator:  Michel Santee, PT, DPT time 1:02 PM Sudie Grumbling 04/07/20    Assessment/Plan: Diagnosis: debility, septic knees, osteomyelitis lumbar spine 1. Does the need for close, 24 hr/day Medical supervision in concert with the patient's rehab needs make it unreasonable for this patient to be served in a less intensive setting? Yes 2. Co-Morbidities requiring supervision/potential complications: PAF, DM, htn, prior lumbar lami, ACDF 3. Due to bladder management, bowel management, safety, skin/wound care, disease management, medication administration, pain management and patient education, does the patient require 24 hr/day rehab nursing? Yes 4. Does the patient require coordinated care of a physician, rehab nurse, PT, OT, and SLP to address physical and functional deficits in the context of the above medical diagnosis(es)? Yes Addressing deficits in the following areas: balance, endurance, locomotion, strength, transferring, bowel/bladder control, bathing, dressing, feeding, grooming, toileting and psychosocial support 5. Can the  patient actively participate in an intensive therapy program of at least 3 hrs of therapy 5 days a week? Yes 6. The potential for patient to make measurable gains while on inpatient rehab is excellent 7. Anticipated functional outcomes upon discharge from inpatient rehab: modified independent and supervision PT, modified independent and supervision OT, n/a SLP 8. Estimated rehab length of stay to reach the above functional goals is: 12-14 days 9. Anticipated discharge destination: Home 10. Overall Rehab/Functional Prognosis: excellent   MD Signature: Meredith Staggers, MD, [facility] 04/07/2020

## 2020-04-02 ENCOUNTER — Inpatient Hospital Stay (HOSPITAL_COMMUNITY): Payer: BC Managed Care – PPO

## 2020-04-02 DIAGNOSIS — M48061 Spinal stenosis, lumbar region without neurogenic claudication: Secondary | ICD-10-CM | POA: Diagnosis not present

## 2020-04-02 DIAGNOSIS — N171 Acute kidney failure with acute cortical necrosis: Secondary | ICD-10-CM | POA: Diagnosis not present

## 2020-04-02 DIAGNOSIS — I480 Paroxysmal atrial fibrillation: Secondary | ICD-10-CM | POA: Diagnosis not present

## 2020-04-02 DIAGNOSIS — I10 Essential (primary) hypertension: Secondary | ICD-10-CM | POA: Diagnosis not present

## 2020-04-02 DIAGNOSIS — R7881 Bacteremia: Secondary | ICD-10-CM | POA: Diagnosis not present

## 2020-04-02 DIAGNOSIS — M5126 Other intervertebral disc displacement, lumbar region: Secondary | ICD-10-CM | POA: Diagnosis not present

## 2020-04-02 LAB — CBC WITH DIFFERENTIAL/PLATELET
Abs Immature Granulocytes: 0.07 10*3/uL (ref 0.00–0.07)
Basophils Absolute: 0 10*3/uL (ref 0.0–0.1)
Basophils Relative: 0 %
Eosinophils Absolute: 0.1 10*3/uL (ref 0.0–0.5)
Eosinophils Relative: 1 %
HCT: 25 % — ABNORMAL LOW (ref 39.0–52.0)
Hemoglobin: 7.8 g/dL — ABNORMAL LOW (ref 13.0–17.0)
Immature Granulocytes: 1 %
Lymphocytes Relative: 8 %
Lymphs Abs: 0.9 10*3/uL (ref 0.7–4.0)
MCH: 29.7 pg (ref 26.0–34.0)
MCHC: 31.2 g/dL (ref 30.0–36.0)
MCV: 95.1 fL (ref 80.0–100.0)
Monocytes Absolute: 0.6 10*3/uL (ref 0.1–1.0)
Monocytes Relative: 6 %
Neutro Abs: 8.8 10*3/uL — ABNORMAL HIGH (ref 1.7–7.7)
Neutrophils Relative %: 84 %
Platelets: 276 10*3/uL (ref 150–400)
RBC: 2.63 MIL/uL — ABNORMAL LOW (ref 4.22–5.81)
RDW: 15.6 % — ABNORMAL HIGH (ref 11.5–15.5)
WBC: 10.5 10*3/uL (ref 4.0–10.5)
nRBC: 0 % (ref 0.0–0.2)

## 2020-04-02 LAB — BASIC METABOLIC PANEL WITH GFR
Anion gap: 7 (ref 5–15)
BUN: 17 mg/dL (ref 8–23)
CO2: 27 mmol/L (ref 22–32)
Calcium: 8.9 mg/dL (ref 8.9–10.3)
Chloride: 101 mmol/L (ref 98–111)
Creatinine, Ser: 1.52 mg/dL — ABNORMAL HIGH (ref 0.61–1.24)
GFR calc Af Amer: 54 mL/min — ABNORMAL LOW
GFR calc non Af Amer: 47 mL/min — ABNORMAL LOW
Glucose, Bld: 245 mg/dL — ABNORMAL HIGH (ref 70–99)
Potassium: 4.2 mmol/L (ref 3.5–5.1)
Sodium: 135 mmol/L (ref 135–145)

## 2020-04-02 LAB — TYPE AND SCREEN
ABO/RH(D): A POS
Antibody Screen: NEGATIVE

## 2020-04-02 LAB — GLUCOSE, CAPILLARY
Glucose-Capillary: 193 mg/dL — ABNORMAL HIGH (ref 70–99)
Glucose-Capillary: 208 mg/dL — ABNORMAL HIGH (ref 70–99)
Glucose-Capillary: 126 mg/dL — ABNORMAL HIGH (ref 70–99)
Glucose-Capillary: 88 mg/dL (ref 70–99)

## 2020-04-02 NOTE — Plan of Care (Signed)
  Problem: Elimination: Goal: Will not experience complications related to bowel motility Outcome: Progressing   

## 2020-04-02 NOTE — Progress Notes (Signed)
PROGRESS NOTE    Johnathan Murray  [id number] DOB: 1951-07-24 DOA: 03/19/2020 PCP: Nicoletta Dress, MD    Brief Narrative:  69 y.o.malewithhistory of paroxysmal atrial fibrillation, hypertension, bioprosthetic aortic valve replacement who had undergone recent cervical discectomy and fusion about 3 weeks ago done by Dr. [place]on Gammon has been feeling weak with increasing pain in the neck and low back and poor appetite. Denies any vomiting or diarrhea denies taking any Tylenol but does take NSAIDs sometimes. Has not noticed any blood in the urine and has been urinating less last few days. Has subjective feeling of fever chills. Given the symptoms patient presents to the ER. Patient states over the last 3 weeks and surgery patient has been finding it difficult to ambulate because of the weakness of the lower extremity. Denies any incontinence of urine or bowel. Has been having more lower extremity edema more on the left side. In the ER on exam patient appears generally weak with weakness of the lower extremity. There is edema of the lower extremity on the left side. Labs show creatinine of 6.4 which is increased from 1.6 about 3 weeks ago. In addition patient's LFTs are markedly elevated at AST of 415 and ALT of 429 patient's albumin is 2.6 total bilirubin 5.5 bicarb 19 sodium 130 calcium 10.2 hemoglobin 10.4 lactic acid 1.6 WBC 12.6 platelets 341 urinalysis shows RBCs of 21-50 WBC 21-50 protein around 100 bacteria rare hyaline cast present mucus present. Patient was given fluid bolus and admitted for acute renal failure. Patient also had CT abdomen pelvis and CT cervical spine which did not show any acute. USS of the abdomen also was unremarkable. Covid test is negative  Assessment & Plan:   Principal Problem:   Enterococcal bacteremia Active Problems:   Essential hypertension   PAF (paroxysmal atrial fibrillation) (HCC)   Abnormal LFTs (liver function tests)   Cervical myelopathy  (HCC)   S/P aortic valve replacement   ARF (acute renal failure) (HCC)   Pseudogout of left knee   Septic arthritis (HCC)   History of fusion of cervical spine   Protein-calorie malnutrition, severe   AKI on CKD stage III/anion gap metabolic acidosis Stable Multifactorial from bacteremia, poor oral intake, in addition to medication induced from NSAID, Lasix and lisinopril use Baseline creatinine 1.3-1.6, on presentation 6.40 Noted urine with RBCs and protein-further work-up Per nephrology IVF now stopped Held patient's Lasix, lisinopril CT abdomen does not show any obstruction Nephrology consulted earlier, since signed off Daily BMP  Sepsis likely 2/2 Enterococcus faecalis bacteremia On presentation, patient was initially hypotensive, with leukocytosis Source from possible knee infection vs recent postoperative cervical/lumbar infection BC x2 growing Enterococcus faecalis, repeat BC x2 NGTD UC no growth, Chest x-ray unremarkable Lactic acid, TSH WNL, cortisol WNL TTE showed no obvious vegetation but increased transaortic gradients across the bioprosthetic valve  Cardiology was consulted for TEE. Pt underwent TEE 5/6. Valves unremarkable, no vegetation seen. Discussed with Cardiology ID recs noted. Plan to complete ampicillin for at least 4 weeks with outpt f/u with ID on 04/16/20.  ID has signed off on 5/7 PICC placed 5/9  Likely LLE septic knee with ruptured Baker's cyst with possibly pseudogout Doppler showed ruptured Baker's cyst on LLE Left knee x-ray showed large joint effusion, septic joint cannot be excluded Orthopedics consulted s/p LLE joint aspiration on 03/21/2020 and RLE joint aspiration on 03/22/2020 both showed positive intracellular calcium pyrophosphate crystals, WBC over 15,000, culture grew rare E faecalis from LLE, culture from RLE without  organisms Status post steroid injection in both knees on 03/22/2020 by orthopedics Now s/p I&D of bilateral knees on 03/24/2020,  stable at this time Pt agreeable for CIR, pending bed availability  Hyperglycemia CBGs have been stable Likely 2/2 recent steroid injection in the knee Continue with SSI, Accu-Cheks, hypoglycemic protocol  Transaminitis Suspect secondary to above sepsis/hypotension/shock liver INR WNL Acute hepatitis panel nonreactive Home amiodarone currently on hold LFT's had been trending towards normal  Paroxysmal A. Fib/bioprosthetic aortic valve replacement Heart rate WNL Holding home amiodarone TTE as noted above Continue Eliquis  Neck and back pain History of recent cervical decompression surgery Imaging does not show anything acute as mentioned above MRI cervical spine with no evidence of infection PT/OT consulted, recs noted for CIR. Pending placement and bed availability  Normocytic anemia/postop anemia Anemia panel iron 43, sats 29, ferritin 3,811 Continued on oral iron supplementation Hgb down to 6.8 recently s/p 2 units PRBC's No evidence of acute blood loss Hgb has been downtrending, while on eliquis FOBT pending  Hypothyroidism TSH levels WNL Continue Synthroid as pt tolerates     DVT prophylaxis: Eliquis Code Status: Full Family Communication: Pt in room, wife at bedside  Status is: Inpatient  Remains inpatient appropriate because:Ongoing diagnostic testing needed not appropriate for outpatient work up and Unsafe d/c plan, need to confirm no active bleed   Dispo: The patient is from: Home              Anticipated d/c is to: CIR              Anticipated d/c date is: 1 day, awaiting CIR placement/bed availability              Patient currently is medically stable to d/c.  Consultants:   ID  Orthopedic Surgery  Critical Care  Nephrology  Procedures:   TEE 5/6  Antimicrobials: Anti-infectives (From admission, onward)   Start     Dose/Rate Route Frequency Ordered Stop   03/27/20 0900  cefTRIAXone (ROCEPHIN) 2 g in sodium chloride 0.9 % 100 mL  IVPB  Status:  Discontinued     2 g 200 mL/hr over 30 Minutes Intravenous Every 12 hours 03/27/20 0828 03/27/20 0901   03/22/20 1515  cefTRIAXone (ROCEPHIN) 2 g in sodium chloride 0.9 % 100 mL IVPB  Status:  Discontinued     2 g 200 mL/hr over 30 Minutes Intravenous Every 12 hours 03/22/20 1442 03/26/20 1043   03/22/20 1000  ampicillin (OMNIPEN) 2 g in sodium chloride 0.9 % 100 mL IVPB     2 g 300 mL/hr over 20 Minutes Intravenous Every 6 hours 03/22/20 0925 04/21/20 2359   03/21/20 1800  ceFEPIme (MAXIPIME) 1 g in sodium chloride 0.9 % 100 mL IVPB  Status:  Discontinued     1 g 200 mL/hr over 30 Minutes Intravenous Every 24 hours 03/20/20 1143 03/21/20 0116   03/21/20 0200  ampicillin (OMNIPEN) 2 g in sodium chloride 0.9 % 100 mL IVPB  Status:  Discontinued     2 g 300 mL/hr over 20 Minutes Intravenous Every 8 hours 03/21/20 0116 03/22/20 0925   03/20/20 1230  ceFEPIme (MAXIPIME) 1 g in sodium chloride 0.9 % 100 mL IVPB     1 g 200 mL/hr over 30 Minutes Intravenous  Once 03/20/20 1143 03/20/20 1358      Subjective: Patient is still complaining of chronic back and bilateral knee pain.  Eager for PT/OT.  Patient denies any chest pain, shortness of breath,  abdominal pain, nausea/vomiting, fever/chills.   Objective: Vitals:   04/01/20 1712 04/01/20 2056 04/02/20 0454 04/02/20 0900  BP: 117/72 119/68 109/71 110/72  Pulse: 92 94 93 90  Resp: 18 17 17 16   Temp: 99.8 F (37.7 C) 99.4 F (37.4 C) 97.9 F (36.6 C) 98 F (36.7 C)  TempSrc: Oral Oral    SpO2: 99% 100% 99% 99%  Weight:  78.1 kg    Height:        Intake/Output Summary (Last 24 hours) at 04/02/2020 1257 Last data filed at 04/02/2020 1200 Gross per 24 hour  Intake 1261.26 ml  Output 3000 ml  Net -1738.74 ml   Filed Weights   03/29/20 0500 03/30/20 2135 04/01/20 2056  Weight: 79 kg 78 kg 78.1 kg    Examination:  General: NAD   Cardiovascular: S1, S2 present  Respiratory: CTAB  Abdomen: Soft, nontender,  nondistended, bowel sounds present  Musculoskeletal: No bilateral pedal edema noted, bilateral knee incisions healing  Skin: Normal  Psychiatry: Normal mood   Data Reviewed: I have personally reviewed following labs and imaging studies  CBC: Recent Labs  Lab 03/29/20 0444 03/29/20 0444 03/30/20 0303 03/31/20 0254 03/31/20 1702 04/01/20 0430 04/02/20 0455  WBC 17.4*   < > 12.4* 10.9* 12.8* 9.4 10.5  NEUTROABS 14.6*  --  10.2* 8.8*  --  7.5 8.8*  HGB 8.8*   < > 8.3* 7.7* 8.3* 7.5* 7.8*  HCT 27.9*   < > 25.8* 24.5* 26.0* 23.6* 25.0*  MCV 95.9   < > 95.9 95.3 95.6 96.3 95.1  PLT 274   < > 259 244 270 251 276   < > = values in this interval not displayed.   Basic Metabolic Panel: Recent Labs  Lab 03/29/20 0444 03/30/20 0303 03/31/20 0254 04/01/20 0430 04/02/20 0455  NA 138 136 136 136 135  K 4.1 3.7 4.1 4.0 4.2  CL 103 103 101 101 101  CO2 24 25 25 26 27   GLUCOSE 109* 177* 185* 151* 245*  BUN 16 17 17 17 17   CREATININE 1.36* 1.45* 1.36* 1.37* 1.52*  CALCIUM 8.8* 8.6* 8.6* 8.6* 8.9   GFR: Estimated Creatinine Clearance: 48.7 mL/min (A) (by C-G formula based on SCr of 1.52 mg/dL (H)). Liver Function Tests: Recent Labs  Lab 03/27/20 0737 03/28/20 0532 03/29/20 0444 03/30/20 0303 04/01/20 0430  AST 24 25 38 49* 99*  ALT 86* 73* 72* 77* 118*  ALKPHOS 82 75 65 76 79  BILITOT 1.7* 1.8* 1.7* 1.7* 1.4*  PROT 5.9* 5.7* 5.9* 5.7* 5.6*  ALBUMIN 2.0* 1.9* 2.0* 2.0* 1.8*   No results for input(s): LIPASE, AMYLASE in the last 168 hours. No results for input(s): AMMONIA in the last 168 hours. Coagulation Profile: No results for input(s): INR, PROTIME in the last 168 hours. Cardiac Enzymes: No results for input(s): CKTOTAL, CKMB, CKMBINDEX, TROPONINI in the last 168 hours. BNP (last 3 results) No results for input(s): PROBNP in the last 8760 hours. HbA1C: No results for input(s): HGBA1C in the last 72 hours. CBG: Recent Labs  Lab 04/01/20 1117 04/01/20 1616  04/01/20 2057 04/02/20 0636 04/02/20 1114  GLUCAP 276* 75 162* 193* 208*   Lipid Profile: No results for input(s): CHOL, HDL, LDLCALC, TRIG, CHOLHDL, LDLDIRECT in the last 72 hours. Thyroid Function Tests: No results for input(s): TSH, T4TOTAL, FREET4, T3FREE, THYROIDAB in the last 72 hours. Anemia Panel: No results for input(s): VITAMINB12, FOLATE, FERRITIN, TIBC, IRON, RETICCTPCT in the last 72 hours. Sepsis Labs: No  results for input(s): PROCALCITON, LATICACIDVEN in the last 168 hours.  Recent Results (from the past 240 hour(s))  Culture, blood (routine x 2)     Status: None   Collection Time: 03/23/20  5:12 PM   Specimen: BLOOD  Result Value Ref Range Status   Specimen Description BLOOD LEFT ANTECUBITAL  Final   Special Requests   Final    BOTTLES DRAWN AEROBIC ONLY Blood Culture results may not be optimal due to an inadequate volume of blood received in culture bottles   Culture   Final    NO GROWTH 5 DAYS Performed at [facility], [place] [address], [place], [place] [address]    Report Status 03/28/2020 FINAL  Final  Culture, blood (routine x 2)     Status: None   Collection Time: 03/23/20  5:12 PM   Specimen: BLOOD RIGHT HAND  Result Value Ref Range Status   Specimen Description BLOOD RIGHT HAND  Final   Special Requests   Final    BOTTLES DRAWN AEROBIC ONLY Blood Culture adequate volume   Culture   Final    NO GROWTH 5 DAYS Performed at [facility], [place] [address], [place], [place] [address]    Report Status 03/28/2020 FINAL  Final  Surgical pcr screen     Status: None   Collection Time: 03/24/20  2:21 AM   Specimen: Nasal Mucosa; Nasal Swab  Result Value Ref Range Status   MRSA, PCR NEGATIVE NEGATIVE Final   Staphylococcus aureus NEGATIVE NEGATIVE Final    Comment: (NOTE) The Xpert SA Assay (FDA approved for NASAL specimens in patients 79 years of age and older), is one component of a comprehensive surveillance program. It is not intended to  diagnose infection nor to guide or monitor treatment. Performed at [facility], [place] [address] [place], [place] [address]   Culture, blood (Routine X 2) w Reflex to ID Panel     Status: None   Collection Time: 03/26/20  9:15 PM   Specimen: BLOOD  Result Value Ref Range Status   Specimen Description BLOOD RIGHT ARM  Final   Special Requests   Final    BOTTLES DRAWN AEROBIC AND ANAEROBIC Blood Culture results may not be optimal due to an excessive volume of blood received in culture bottles   Culture   Final    NO GROWTH 5 DAYS Performed at [facility], [place] [address], [place], [place] [address]    Report Status 03/31/2020 FINAL  Final  Culture, blood (Routine X 2) w Reflex to ID Panel     Status: None   Collection Time: 03/26/20  9:25 PM   Specimen: BLOOD  Result Value Ref Range Status   Specimen Description BLOOD LEFT FOREARM  Final   Special Requests   Final    BOTTLES DRAWN AEROBIC AND ANAEROBIC Blood Culture results may not be optimal due to an excessive volume of blood received in culture bottles   Culture   Final    NO GROWTH 5 DAYS Performed at [facility], [place] [address] [place], [place] [address]    Report Status 03/31/2020 FINAL  Final     Radiology Studies: No results found.  Scheduled Meds: . sodium chloride   Intravenous Once  . apixaban  5 mg Oral BID  . Chlorhexidine Gluconate Cloth  6 each Topical Daily  . docusate sodium  100 mg Oral BID  . feeding supplement (ENSURE ENLIVE)  237 mL  Oral TID BM  . feeding supplement (PRO-STAT SUGAR FREE 64)  30 mL Oral BID  . insulin aspart  0-15 Units Subcutaneous TID WC  . insulin aspart  0-5 Units Subcutaneous QHS  . insulin aspart  3 Units Subcutaneous TID WC  . insulin glargine  10 Units Subcutaneous BID  . iron polysaccharides  150 mg Oral Q breakfast  . levothyroxine  25 mcg Oral Q0600  . midodrine  10 mg Oral TID WC  . senna  1 tablet Oral BID  . sodium chloride flush  10-40 mL  Intracatheter Q12H   Continuous Infusions: . sodium chloride 250 mL (04/01/20 0106)  . ampicillin (OMNIPEN) IV 2 g (04/02/20 1006)     LOS: 13 days   Alma Friendly, MD [facility] Pager On Amion  If 7PM-7AM, please contact night-coverage 04/02/2020, 12:57 PM

## 2020-04-02 NOTE — Progress Notes (Signed)
Subjective:   Patient is alert, oriented, laying in bed. States he is very uncomfortably. He is having severe low back pain, especially on the left side, and pain at the posterior aspect of his right knee. He has had a hard time sleeping at night because he is unable to get comfortable due to the back pain. Denies calf pain. Denies fever or chills. No other complaints.   Objective:  PE: VITALS:   Vitals:   04/01/20 1712 04/01/20 2056 04/02/20 0454 04/02/20 0900  BP: 117/72 119/68 109/71 110/72  Pulse: 92 94 93 90  Resp: 18 17 17 16   Temp: 99.8 F (37.7 C) 99.4 F (37.4 C) 97.9 F (36.6 C) 98 F (36.7 C)  TempSrc: Oral Oral    SpO2: 99% 100% 99% 99%  Weight:  78.1 kg    Height:       General: Patient laying in bed, in no acute distress Resp: No use of accessory musculature MSK: BLE - arthroscopic incision sites appear to be healing well, no further drainage. Mild effusion of left and right knees (left worse than right) but both improved since original aspiration. Moderate pain with AROM of both knees (left worse than right). Point TTP to left low back.  LABS  Results for orders placed or performed during the hospital encounter of 03/19/20 (from the past 24 hour(s))  Glucose, capillary     Status: None   Collection Time: 04/01/20  4:16 PM  Result Value Ref Range   Glucose-Capillary 75 70 - 99 mg/dL  Glucose, capillary     Status: Abnormal   Collection Time: 04/01/20  8:57 PM  Result Value Ref Range   Glucose-Capillary 162 (H) 70 - 99 mg/dL  CBC with Differential/Platelet     Status: Abnormal   Collection Time: 04/02/20  4:55 AM  Result Value Ref Range   WBC 10.5 4.0 - 10.5 K/uL   RBC 2.63 (L) 4.22 - 5.81 MIL/uL   Hemoglobin 7.8 (L) 13.0 - 17.0 g/dL   HCT 25.0 (L) 39.0 - 52.0 %   MCV 95.1 80.0 - 100.0 fL   MCH 29.7 26.0 - 34.0 pg   MCHC 31.2 30.0 - 36.0 g/dL   RDW 15.6 (H) 11.5 - 15.5 %   Platelets 276 150 - 400 K/uL   nRBC 0.0 0.0 - 0.2 %   Neutrophils Relative  % 84 %   Neutro Abs 8.8 (H) 1.7 - 7.7 K/uL   Lymphocytes Relative 8 %   Lymphs Abs 0.9 0.7 - 4.0 K/uL   Monocytes Relative 6 %   Monocytes Absolute 0.6 0.1 - 1.0 K/uL   Eosinophils Relative 1 %   Eosinophils Absolute 0.1 0.0 - 0.5 K/uL   Basophils Relative 0 %   Basophils Absolute 0.0 0.0 - 0.1 K/uL   Immature Granulocytes 1 %   Abs Immature Granulocytes 0.07 0.00 - 0.07 K/uL  Basic metabolic panel     Status: Abnormal   Collection Time: 04/02/20  4:55 AM  Result Value Ref Range   Sodium 135 135 - 145 mmol/L   Potassium 4.2 3.5 - 5.1 mmol/L   Chloride 101 98 - 111 mmol/L   CO2 27 22 - 32 mmol/L   Glucose, Bld 245 (H) 70 - 99 mg/dL   BUN 17 8 - 23 mg/dL   Creatinine, Ser 1.52 (H) 0.61 - 1.24 mg/dL   Calcium 8.9 8.9 - 10.3 mg/dL   GFR calc non Af Amer 47 (L) >60 mL/min  GFR calc Af Amer 54 (L) >60 mL/min   Anion gap 7 5 - 15  Type and screen [place]     Status: None   Collection Time: 04/02/20  4:55 AM  Result Value Ref Range   ABO/RH(D) A POS    Antibody Screen NEG    Sample Expiration      04/05/2020,2359 Performed at [facility], [place] [address] [place], [place] [address]   Glucose, capillary     Status: Abnormal   Collection Time: 04/02/20  6:36 AM  Result Value Ref Range   Glucose-Capillary 193 (H) 70 - 99 mg/dL  Glucose, capillary     Status: Abnormal   Collection Time: 04/02/20 11:14 AM  Result Value Ref Range   Glucose-Capillary 208 (H) 70 - 99 mg/dL    No results found.  Assessment/Plan: Principal Problem:   Enterococcal bacteremia Active Problems:   Essential hypertension   PAF (paroxysmal atrial fibrillation) (HCC)   Abnormal LFTs (liver function tests)   Cervical myelopathy (HCC)   S/P aortic valve replacement   ARF (acute renal failure) (HCC)   Pseudogout of left knee   Septic arthritis (HCC)   History of fusion of cervical spine   Protein-calorie malnutrition, severe  Septic arthritis 9 days s/p bilateral knee  irrigation and debridement -Initially aspirationof left knee showedintracellular calcium pyrophosphate crystals with only 15,000 white blood cells -consistent with pseudogout, however, his Gram stain and culture and sensitivity grewout Enterococcus faecalis - no growth from right knee aspirate - patient can continue to WBAT bilateral knees - arthroscopic incisions appear to be healing well, sutures removed today - continue PT and OT, encouraged stretching especially right calf and hamstrings - awaiting CIR placement  Neck and Low Back Pain - low back pain worse than neck pain, patient has long history of lumbar pain though this is the worst it has been since admission - cervical discectomy with fusion performed 5 weeks ago, history of July 2020 lumbar decompressive surgery at L4-L5 - c-spine MRI performed 5/3 showed no evidence of infection - will move forward with a lumbar MRI to rule out any lumbar discitis - encouraged continued stretching and changing positions from bed to chair when uncomfortable  Ventura Bruns 04/02/2020, 2:03 PM   Contact information:   Weekdays 8-5 Merlene Pulling, PA-C [phone] A fter hours and holidays please check [email or web address] for group call information for [facility]

## 2020-04-02 NOTE — Plan of Care (Signed)
°  Problem: Coping: °Goal: Level of anxiety will decrease °Outcome: Progressing °  °

## 2020-04-02 NOTE — Progress Notes (Signed)
Inpatient Rehabilitation-Admissions Coordinator   Met with pt and his wife bedside to alert them that I do not have a bed open for him in CIR today. They are hopeful for the next day or two. Noted pt has received 3 doses of IV morphine today for pain control. Made pt and his wife aware that we do not provide IV pain medication on rehab floor and he will need to have better pain control prior to acceptance. Will continue to follow for possible admit, pending bed availability.   Raechel Ache, OTR/L  Rehab Admissions Coordinator  [phone] 04/02/2020 1:26 PM

## 2020-04-03 DIAGNOSIS — M4626 Osteomyelitis of vertebra, lumbar region: Secondary | ICD-10-CM | POA: Diagnosis not present

## 2020-04-03 DIAGNOSIS — M4646 Discitis, unspecified, lumbar region: Secondary | ICD-10-CM | POA: Diagnosis not present

## 2020-04-03 DIAGNOSIS — I480 Paroxysmal atrial fibrillation: Secondary | ICD-10-CM | POA: Diagnosis not present

## 2020-04-03 DIAGNOSIS — G061 Intraspinal abscess and granuloma: Secondary | ICD-10-CM | POA: Diagnosis not present

## 2020-04-03 DIAGNOSIS — R7881 Bacteremia: Secondary | ICD-10-CM | POA: Diagnosis not present

## 2020-04-03 DIAGNOSIS — I10 Essential (primary) hypertension: Secondary | ICD-10-CM | POA: Diagnosis not present

## 2020-04-03 DIAGNOSIS — N171 Acute kidney failure with acute cortical necrosis: Secondary | ICD-10-CM | POA: Diagnosis not present

## 2020-04-03 LAB — CBC WITH DIFFERENTIAL/PLATELET
Abs Immature Granulocytes: 0.06 10*3/uL (ref 0.00–0.07)
Basophils Absolute: 0.1 10*3/uL (ref 0.0–0.1)
Basophils Relative: 0 %
Eosinophils Absolute: 0.1 10*3/uL (ref 0.0–0.5)
Eosinophils Relative: 1 %
HCT: 24.5 % — ABNORMAL LOW (ref 39.0–52.0)
Hemoglobin: 7.8 g/dL — ABNORMAL LOW (ref 13.0–17.0)
Immature Granulocytes: 1 %
Lymphocytes Relative: 10 %
Lymphs Abs: 1.1 10*3/uL (ref 0.7–4.0)
MCH: 30.1 pg (ref 26.0–34.0)
MCHC: 31.8 g/dL (ref 30.0–36.0)
MCV: 94.6 fL (ref 80.0–100.0)
Monocytes Absolute: 0.7 10*3/uL (ref 0.1–1.0)
Monocytes Relative: 6 %
Neutro Abs: 9.2 10*3/uL — ABNORMAL HIGH (ref 1.7–7.7)
Neutrophils Relative %: 82 %
Platelets: 298 10*3/uL (ref 150–400)
RBC: 2.59 MIL/uL — ABNORMAL LOW (ref 4.22–5.81)
RDW: 15.3 % (ref 11.5–15.5)
WBC: 11.1 10*3/uL — ABNORMAL HIGH (ref 4.0–10.5)
nRBC: 0 % (ref 0.0–0.2)

## 2020-04-03 LAB — BASIC METABOLIC PANEL
Anion gap: 9 (ref 5–15)
BUN: 16 mg/dL (ref 8–23)
CO2: 27 mmol/L (ref 22–32)
Calcium: 9 mg/dL (ref 8.9–10.3)
Chloride: 101 mmol/L (ref 98–111)
Creatinine, Ser: 1.36 mg/dL — ABNORMAL HIGH (ref 0.61–1.24)
GFR calc Af Amer: >60 mL/min (ref >60)
GFR calc non Af Amer: 53 mL/min — ABNORMAL LOW (ref >60)
Glucose, Bld: 114 mg/dL — ABNORMAL HIGH (ref 70–99)
Potassium: 4.1 mmol/L (ref 3.5–5.1)
Sodium: 137 mmol/L (ref 135–145)

## 2020-04-03 LAB — GLUCOSE, CAPILLARY
Glucose-Capillary: 122 mg/dL — ABNORMAL HIGH (ref 70–99)
Glucose-Capillary: 277 mg/dL — ABNORMAL HIGH (ref 70–99)
Glucose-Capillary: 195 mg/dL — ABNORMAL HIGH (ref 70–99)
Glucose-Capillary: 132 mg/dL — ABNORMAL HIGH (ref 70–99)

## 2020-04-03 MED ORDER — OXYCODONE HCL 5 MG PO TABS
5.0000 mg | ORAL_TABLET | ORAL | Status: DC | PRN
  Administered 2020-04-03 – 2020-04-07 (×14): 10 mg via ORAL
  Filled 2020-04-03 (×14): qty 2

## 2020-04-03 MED ORDER — MORPHINE SULFATE (PF) 2 MG/ML IV SOLN: 2.0000 mg | Freq: Four times a day (QID) | INTRAVENOUS | Status: DC | PRN

## 2020-04-03 NOTE — Discharge Instructions (Signed)
Diet: As you were doing prior to hospitalization   Shower:  May shower but keep the wounds dry, use an occlusive plastic wrap, NO SOAKING IN TUB.  If the bandage gets wet, change with a clean dry gauze.  If you have a splint on, leave the splint in place and keep the splint dry with a plastic bag.  Dressing:  You may change your dressing 3-5 days after surgery, unless you have a splint.  If you have a splint, then just leave the splint in place and we will change your bandages during your first follow-up appointment.    If you had hand or foot surgery, we will plan to remove your stitches in about 2 weeks in the office.  For all other surgeries, there are sticky tapes (steri-strips) on your wounds and all the stitches are absorbable.  Leave the steri-strips in place when changing your dressings, they will peel off with time, usually 2-3 weeks.  Activity:  Increase activity slowly as tolerated, but follow the weight bearing instructions below.  The rules on driving is that you can not be taking narcotics while you drive, and you must feel in control of the vehicle.    Weight Bearing:   As tolerated  To prevent constipation: you may use a stool softener such as -  Colace (over the counter) 100 mg by mouth twice a day  Drink plenty of fluids (prune juice may be helpful) and high fiber foods Miralax (over the counter) for constipation as needed.    Itching:  If you experience itching with your medications, try taking only a single pain pill, or even half a pain pill at a time.  You may take up to 10 pain pills per day, and you can also use benadryl over the counter for itching or also to help with sleep.   Precautions:  If you experience chest pain or shortness of breath - call 911 immediately for transfer to the hospital emergency department!!  If you develop a fever greater that 101 F, purulent drainage from wound, increased redness or drainage from wound, or calf pain -- Call the office at  [phone]                                                Follow- Up Appointment:  Please call for an appointment to be seen in 2 weeks [place] - [phone]   Information on my medicine - ELIQUIS (apixaban)  This medication education was reviewed with me or my healthcare representative as part of my discharge preparation.  The pharmacist that spoke with me during my hospital stay was:  Onnie Boer, RPH-CPP  Why was Eliquis prescribed for you? Eliquis was prescribed for you to reduce the risk of a blood clot forming that can cause a stroke if you have a medical condition called atrial fibrillation (a type of irregular heartbeat).  What do You need to know about Eliquis ? Take your Eliquis TWICE DAILY - one tablet in the morning and one tablet in the evening with or without food. If you have difficulty swallowing the tablet whole please discuss with your pharmacist how to take the medication safely.  Take Eliquis exactly as prescribed by your doctor and DO NOT stop taking Eliquis without talking to the doctor who prescribed the medication.  Stopping may increase your risk of developing  a stroke.  Refill your prescription before you run out.  After discharge, you should have regular check-up appointments with your healthcare provider that is prescribing your Eliquis.  In the future your dose may need to be changed if your kidney function or weight changes by a significant amount or as you get older.  What do you do if you miss a dose? If you miss a dose, take it as soon as you remember on the same day and resume taking twice daily.  Do not take more than one dose of ELIQUIS at the same time to make up a missed dose.  Important Safety Information A possible side effect of Eliquis is bleeding. You should call your healthcare provider right away if you experience any of the following: ? Bleeding from an injury or your nose that does not stop. ? Unusual colored urine (red or dark  brown) or unusual colored stools (red or black). ? Unusual bruising for unknown reasons. ? A serious fall or if you hit your head (even if there is no bleeding).  Some medicines may interact with Eliquis and might increase your risk of bleeding or clotting while on Eliquis. To help avoid this, consult your healthcare provider or pharmacist prior to using any new prescription or non-prescription medications, including herbals, vitamins, non-steroidal anti-inflammatory drugs (NSAIDs) and supplements.  This website has more information on Eliquis (apixaban): http://www.eliquis.com/eliquis/home

## 2020-04-03 NOTE — Progress Notes (Signed)
Subjective:   Patient is alert, oriented, laying in bed. Patient continues to have neck pain and low back pain as well as bilateral knee pain with left knee being worse than right. Motivated to continue working with physical therapy but frustrated with his progress thus far.   Objective:  PE: VITALS:   Vitals:   04/03/20 0436 04/03/20 0943 04/03/20 1107 04/03/20 1408  BP: 126/84 130/87 109/73 133/78  Pulse: 88 (!) 111 (!) 101 87  Resp: 18 18 18 18   Temp: 98.5 F (36.9 C) 98.4 F (36.9 C) 98.2 F (36.8 C) 98.3 F (36.8 C)  TempSrc: Oral Oral Oral Oral  SpO2: 100% 100% 100% 99%  Weight: 80.7 kg     Height:       General:  GI: abdomen non-distended, non-tender MSK: No further drainage form arthroscopic incision sites, mild redness around portal sites. Continued effusions of bilateral knees, left worse than right. Moderate pain with AROM of both knee, left worse than right. Approx. 15-45 degrees ROM of Left knee while laying in bed, 10-45 of right knee. Distal sensation intact bilaterally. EHL and FHL intact bilaterally. 2+ DP pulses.    LABS  Results for orders placed or performed during the hospital encounter of 03/19/20 (from the past 24 hour(s))  Glucose, capillary     Status: Abnormal   Collection Time: 04/02/20  4:24 PM  Result Value Ref Range   Glucose-Capillary 126 (H) 70 - 99 mg/dL  Glucose, capillary     Status: None   Collection Time: 04/02/20  8:59 PM  Result Value Ref Range   Glucose-Capillary 88 70 - 99 mg/dL  CBC with Differential/Platelet     Status: Abnormal   Collection Time: 04/03/20  5:00 AM  Result Value Ref Range   WBC 11.1 (H) 4.0 - 10.5 K/uL   RBC 2.59 (L) 4.22 - 5.81 MIL/uL   Hemoglobin 7.8 (L) 13.0 - 17.0 g/dL   HCT 24.5 (L) 39.0 - 52.0 %   MCV 94.6 80.0 - 100.0 fL   MCH 30.1 26.0 - 34.0 pg   MCHC 31.8 30.0 - 36.0 g/dL   RDW 15.3 11.5 - 15.5 %   Platelets 298 150 - 400 K/uL   nRBC 0.0 0.0 - 0.2 %   Neutrophils Relative % 82 %   Neutro  Abs 9.2 (H) 1.7 - 7.7 K/uL   Lymphocytes Relative 10 %   Lymphs Abs 1.1 0.7 - 4.0 K/uL   Monocytes Relative 6 %   Monocytes Absolute 0.7 0.1 - 1.0 K/uL   Eosinophils Relative 1 %   Eosinophils Absolute 0.1 0.0 - 0.5 K/uL   Basophils Relative 0 %   Basophils Absolute 0.1 0.0 - 0.1 K/uL   Immature Granulocytes 1 %   Abs Immature Granulocytes 0.06 0.00 - 0.07 K/uL  Basic metabolic panel     Status: Abnormal   Collection Time: 04/03/20  5:00 AM  Result Value Ref Range   Sodium 137 135 - 145 mmol/L   Potassium 4.1 3.5 - 5.1 mmol/L   Chloride 101 98 - 111 mmol/L   CO2 27 22 - 32 mmol/L   Glucose, Bld 114 (H) 70 - 99 mg/dL   BUN 16 8 - 23 mg/dL   Creatinine, Ser 1.36 (H) 0.61 - 1.24 mg/dL   Calcium 9.0 8.9 - 10.3 mg/dL   GFR calc non Af Amer 53 (L) >60 mL/min   GFR calc Af Amer >60 >60 mL/min   Anion gap 9  5 - 15  Glucose, capillary     Status: Abnormal   Collection Time: 04/03/20  6:35 AM  Result Value Ref Range   Glucose-Capillary 122 (H) 70 - 99 mg/dL  Glucose, capillary     Status: Abnormal   Collection Time: 04/03/20 11:09 AM  Result Value Ref Range   Glucose-Capillary 277 (H) 70 - 99 mg/dL    MR LUMBAR SPINE WO CONTRAST  Result Date: 04/02/2020 CLINICAL DATA:  For comparison initial evaluation for acute low back pain, recent surgery, infection suspected. EXAM: MRI LUMBAR SPINE WITHOUT CONTRAST TECHNIQUE: Multiplanar, multisequence MR imaging of the lumbar spine was performed. No intravenous contrast was administered. COMPARISON:  None. FINDINGS: Segmentation: Standard. Lowest well-formed disc space labeled the L5-S1 level. Alignment: Physiologic with preservation of the normal lumbar lordosis. No listhesis. Vertebrae: Abnormal T2/STIR signal intensity seen throughout the L4-5 interspace with associated progressive disc space height loss as compared to previous. Associated abnormal marrow edema with mild endplate irregularity within the adjacent L4 and L5 vertebral bodies. Findings  consistent with acute osteomyelitis discitis. Irregular heterogeneous T1/T2 signal intensity seen within the ventral epidural space at this level could reflect postoperative changes and/or epidural phlegmon/abscess (series 2, image 8). Mild reactive marrow edema with small joint effusion seen about the left greater than right L4-5 facets as well, which could also be related to infection and/or arthritic changes. No other evidence for acute infection within the lumbar spine. No evidence for acute or chronic fracture. Underlying bone marrow signal intensity mildly heterogeneous but within normal limits. No worrisome osseous lesions. Conus medullaris and cauda equina: Conus extends to the L1 level. Conus and cauda equina appear normal. Paraspinal and other soft tissues: Soft tissue edema seen throughout the lower posterior paraspinous soft tissues, which could be related to postoperative changes and/or infection/myositis. Abnormal edema seen within the psoas musculature bilaterally as well, likely related to infection. Few small superimposed soft tissue collections present within both psoas muscles, largest of which measures approximately 11 mm on the right (series 5, image 29), consistent with small abscesses. Few small cyst noted within the kidneys bilaterally. Visualized visceral structures otherwise unremarkable. Disc levels: L1-2: Negative interspace. Mild facet hypertrophy. No canal or foraminal stenosis. L2-3: Mild diffuse disc bulge with disc desiccation. Disc bulge asymmetric to the right with associated mild reactive endplate changes. Moderate facet and ligament flavum hypertrophy. Resultant mild spinal stenosis. Foramina remain patent. L3-4: Mild disc bulge with disc desiccation. Superimposed broad right extraforaminal disc protrusion (series 5, image 19). Moderate facet and ligament flavum hypertrophy. Resultant moderate canal with bilateral subarticular stenosis. Mild bilateral L3 foraminal narrowing.  L4-5: Findings concerning for osteomyelitis discitis as above. Sequelae of recent posterior decompression. Residual left greater than right facet degeneration. Persistent moderate spinal stenosis, improved as compared to preoperative exam. Moderate right with severe left L4 foraminal narrowing. L5-S1: Diffuse disc bulge with disc desiccation and mild intervertebral disc space narrowing. Mild reactive endplate changes. Moderate facet and ligament flavum hypertrophy. Resultant moderate bilateral subarticular stenosis. Moderate right worse than left L5 foraminal narrowing. IMPRESSION: 1. Findings consistent with acute osteomyelitis discitis at L4-5. Heterogeneous signal intensity within the adjacent ventral epidural space could reflect epidural abscess and/or phlegmon or possibly disc material. Sequelae of recent posterior decompression at this level with persistent moderate spinal stenosis. 2. Evidence for associated soft tissue infection/cellulitis within the psoas musculature bilaterally with a few small superimposed abscesses as above. Edema throughout the posterior paraspinous musculature could reflect postoperative changes and/or myositis/infection. 3. Additional multilevel  degenerative spondylosis as above, most pronounced at L3-4 where there is resultant moderate spinal stenosis, otherwise stable. Electronically Signed   By: Jeannine Boga M.D.   On: 04/02/2020 18:56    Assessment/Plan: Principal Problem:   Enterococcal bacteremia Active Problems:   Essential hypertension   PAF (paroxysmal atrial fibrillation) (HCC)   Abnormal LFTs (liver function tests)   Cervical myelopathy (HCC)   S/P aortic valve replacement   ARF (acute renal failure) (HCC)   Pseudogout of left knee   Septic arthritis (HCC)   History of fusion of cervical spine   Protein-calorie malnutrition, severe   Septic arthritis 9 days s/p bilateral knee irrigation and debridement -Initially aspirationof left knee  showedintracellular calcium pyrophosphate crystals with only 15,000 white blood cells -consistent with pseudogout, however, his Gram stain and culture and sensitivity grewout Enterococcus faecalis - no growth from right knee aspirate - patient can continue to WBAT bilateral knees - arthroscopic incisions appear to be healing well - continue PT and OT, encouraged stretching especially right calf and hamstrings - awaiting CIR placement  Neck and Low Back Pain - low back pain worse than neck pain, patient has long history of lumbar pain though this is the worst it has been since admission - cervical discectomy with fusion performed 5 weeks ago, history of July 2020 lumbar decompressive surgery at L4-L5 - c-spine MRI performed 5/3 showed no evidence of infection - L-spine MRI performed 5/13 showed scute osteomyelitis discitis at L4-5, associated soft tissue infection within the psoas muscle, additional multilevel degenerative spondylosis most pronounced at L3-L4 - will speak with medicine regarding a neurosurgery consult   Ventura Bruns 04/03/2020, 4:04 PM   Contact information:   Weekdays 8-5 Merlene Pulling, PA-C [phone] A fter hours and holidays please check [email or web address] for group call information for [facility]

## 2020-04-03 NOTE — Progress Notes (Signed)
Occupational Therapy Treatment Patient Details Name: Johnathan Murray MRN: [id number] DOB: 1951/05/02 Today's Date: 04/03/2020    History of present illness Pt is a 69 y/o male s/p ACDF C3-4 on 4/12, admitted to [facility] on 4/30 for progressive weakness, fatigue, bilateral knee pain L>R. Pt with AKI, bacteremia, L septic knee with plan for I&D bilateral knees on 5/4 pm . PMHx includes previous ACDF C4-6 (04/2019), lumbar sx (05/2019), CAD, DM, HTN, hx of CABGNow s/p I&D of bilateral knees on 03/24/2020   OT comments  Patient up in chair on arrival.  He is very motivated to work hard and wants a lot of therapy.  This session patient in in pain and denying transfer attempt.  Session focused on education on light UE theraband exercise to maintain UB strength for functional mobility with walker.  Discussed CIR and therapy progression with patient and wife, remains a good candidate for CIR.  Will continue to follow with OT acutely to address the deficits listed below.    Follow Up Recommendations  CIR    Equipment Recommendations  None recommended by OT    Recommendations for Other Services      Precautions / Restrictions Precautions Precautions: Fall;Cervical Precaution Comments: ACDF on 4/12, pt reports they d/c-ed collar 1 week after surgery Restrictions Weight Bearing Restrictions: No       Mobility Bed Mobility               General bed mobility comments: In chair on arrival  Transfers                      Balance Overall balance assessment: Needs assistance Sitting-balance support: Feet supported;No upper extremity supported Sitting balance-Leahy Scale: Fair Sitting balance - Comments: Perform bilateral UE exercises while seated                                    ADL either performed or assessed with clinical judgement   ADL Overall ADL's : Needs assistance/impaired Eating/Feeding: Independent                                      General ADL Comments: Provided UE theraband and education, discussed CIR     Vision       Perception     Praxis      Cognition Arousal/Alertness: Awake/alert Behavior During Therapy: WFL for tasks assessed/performed Overall Cognitive Status: Within Functional Limits for tasks assessed                                 General Comments: Generally unhappy with his progress        Exercises Exercises: General Upper Extremity General Exercises - Upper Extremity Shoulder Flexion: AROM;Strengthening;Both;5 reps;Theraband Theraband Level (Shoulder Flexion): Level 1 (Yellow) Shoulder Horizontal ABduction: AROM;Strengthening;5 reps;Theraband;Both Theraband Level (Shoulder Horizontal Abduction): Level 1 (Yellow) Elbow Flexion: AROM;Strengthening;5 reps;Both;Theraband Theraband Level (Elbow Flexion): Level 1 (Yellow)   Shoulder Instructions       General Comments      Pertinent Vitals/ Pain       Pain Assessment: Faces Faces Pain Scale: Hurts even more Pain Location: Back and bilateral knees, left knee more than right; Overall back pain is the worst Pain Descriptors / Indicators: Cramping;Discomfort Pain Intervention(s): Limited activity  within patient's tolerance;Monitored during session  Home Living                                          Prior Functioning/Environment              Frequency  Min 2X/week        Progress Toward Goals  OT Goals(current goals can now be found in the care plan section)  Progress towards OT goals: Progressing toward goals  Acute Rehab OT Goals Patient Stated Goal: go home, feel stronger OT Goal Formulation: With patient/family Time For Goal Achievement: 04/08/20 Potential to Achieve Goals: Good  Plan Discharge plan remains appropriate    Co-evaluation                 AM-PAC OT "6 Clicks" Daily Activity     Outcome Measure   Help from another person eating meals?: None Help from  another person taking care of personal grooming?: A Little Help from another person toileting, which includes using toliet, bedpan, or urinal?: A Lot Help from another person bathing (including washing, rinsing, drying)?: A Lot Help from another person to put on and taking off regular upper body clothing?: A Little Help from another person to put on and taking off regular lower body clothing?: A Lot 6 Click Score: 16    End of Session    OT Visit Diagnosis: Unsteadiness on feet (R26.81);Muscle weakness (generalized) (M62.81);Other abnormalities of gait and mobility (R26.89);Pain Pain - Right/Left: (bilateral) Pain - part of body: Knee;Hip(back)   Activity Tolerance Patient limited by fatigue;Patient limited by pain   Patient Left in chair;with call bell/phone within reach;with family/visitor present   Nurse Communication Mobility status        Time: [id number] OT Time Calculation (min): 16 min  Charges: OT General Charges $OT Visit: 1 Visit OT Treatments $Therapeutic Activity: 8-22 mins  August Luz, OTR/L    Phylliss Bob 04/03/2020, 12:04 PM

## 2020-04-03 NOTE — Progress Notes (Signed)
Orthopedic Tech Progress Note Patient Details:  Johnathan Murray 03-27-1951 [id number] Called in order to [facility] for a LUMBAR CORSETT Patient ID: Johnathan Murray, male   DOB: 11/13/1951, 69 y.o.   MRN: [id number]   [name] 04/03/2020, 3:28 PM

## 2020-04-03 NOTE — Progress Notes (Signed)
Neurointerventional Radiology Brief Note:  NIR consulted for disc aspiration after MR Lumbar Spine today shows new discitis/osteomyelitis despite several days of IV abx for Enterococcus bacteremia.   Patient afebrile.  Slight increase in WBC from 10.5  11.1 overnight.   Imaging and case reviewed by Dr. Karenann Cai. Patient approved for percutaneous disc aspiration.  Will need 48 hr hold of Eliquis prior to procedure.   Recommend holding IV abx to improve potential yield of aspiration.  Primary MD paged.   Formal consult to follow.  Plan for disc aspiration early next week.   Brynda Greathouse, MS RD PA-C 4:15 PM

## 2020-04-03 NOTE — Consult Note (Signed)
Providing Compassionate, Quality Care - Together   Reason for Consult: L4-5 osteomyelitis discitis Referring Physician: Dr. Cresenciano Lick Johnathan Murray is an 69 y.o. male.  HPI: Johnathan Murray with a past medical history outlined below. He underwent bilateral decompressive laminotomies and foraminotomies at L4-5 by [name] on 06/11/2019. He did well postoperatively and his preoperative symptoms were significantly improved. A little over two weeks ago he developed a significant increase in weakness, fever, chills, and pain and presented to the ED. He was found to have decreased renal function and elevated LFTs. Cultures revealed enterococcal bacteremia, with likely source being septic arthritis of his left knee. He was started on antibiotics and followed by infectious disease. There was no vegetation on his bioprosthetic AV. Patient continued to complain of low back pain. Lumbar MRI revealed osteomyelitis discitis at L4-5. Neurosurgery was consulted for further evaluation and recommendations.  Past Medical History:  Diagnosis Date  . Abnormal LFTs (liver function tests) 10/31/2016  . Abnormal nuclear stress test 05/03/2018  . Acid reflux 02/15/2018   not at present  . Acute otitis externa of left ear 11/13/2019  . Acute suppurative otitis media of left ear without spontaneous rupture of tympanic membrane 11/13/2019  . Anemia    low iron  . Aortic stenosis 05/03/2018  . Arthritis 02/15/2018   Overview:  shoulder  . Cancer ([place])    skin cancer on left wrist  . Cervical myelopathy ([place]) 04/22/2019  . Conductive hearing loss of left ear 11/13/2019  . Coronary artery disease involving native coronary artery of native heart without angina pectoris 05/30/2016  . Coronary artery disease of native artery of native heart with stable angina pectoris ([place])    pt. denies  . Diabetes mellitus due to underlying condition with unspecified complications ([place]) [id number]  . Dyslipidemia 05/30/2016  . Dyspnea      upon exertion,  as of 06/05/09 not having any recent chest pain  . Dysrhythmia    a fib  . Essential hypertension 05/30/2016  . Heart murmur    Has had an Aortic valve replacement  . Hyperactive gag reflex 02/15/2018  . Hypothyroidism   . Intermittent claudication ([place]) 10/31/2018  . Lumbar stenosis with neurogenic claudication 06/11/2019  . Macular degeneration 02/15/2018  . PAF (paroxysmal atrial fibrillation) ([place]) 05/30/2016  . Pre-operative cardiovascular examination 01/03/2020  . Preoperative cardiovascular examination 04/17/2019  . Pseudogout of left knee 03/21/2020  . Renal insufficiency 01/17/2020  . S/P aortic valve replacement 01/17/2020  . Tinnitus of left ear 11/13/2019    Past Surgical History:  Procedure Laterality Date  . ANTERIOR CERVICAL DECOMP/DISCECTOMY FUSION N/A 04/22/2019   Procedure: Anterior Cervical Discectomy and Fusion, Cervical four-five, Cervical five-six;  Surgeon: Earnie Larsson, MD;  Location: [facility];  Service: Neurosurgery;  Laterality: N/A;  . ANTERIOR CERVICAL DECOMP/DISCECTOMY FUSION N/A 03/02/2020   Procedure: Anterior Cervical Decompression Fusion  - Cervical three-Cervical four;  Surgeon: Earnie Larsson, MD;  Location: [facility];  Service: Neurosurgery;  Laterality: N/A;  . AORTIC VALVE REPLACEMENT    . CARDIAC CATHETERIZATION     several stents  . CARDIAC SURGERY    . CORONARY ARTERY BYPASS GRAFT  2017   patient unsure how many, had at [facility]  . HAND SURGERY    . IRRIGATION AND DEBRIDEMENT KNEE Bilateral 03/24/2020   Procedure: ARTHROSCOPY IRRIGATION AND DEBRIDEMENT BILATERAL  KNEE;  Surgeon: Marchia Bond, MD;  Location: [facility];  Service: Orthopedics;  Laterality: Bilateral;  . KNEE SURGERY    .  LEFT HEART CATH AND CORONARY ANGIOGRAPHY N/A 01/21/2020   Procedure: LEFT HEART CATH AND CORONARY ANGIOGRAPHY;  Surgeon: Belva Crome, MD;  Location: [facility];  Service: Cardiovascular;  Laterality: N/A;  . LEFT HEART CATH AND CORS/GRAFTS  ANGIOGRAPHY N/A 05/16/2018   Procedure: LEFT HEART CATH AND CORS/GRAFTS ANGIOGRAPHY;  Surgeon: Burnell Blanks, MD;  Location: [place] [facility];  Service: Cardiovascular;  Laterality: N/A;  . LUMBAR LAMINECTOMY/DECOMPRESSION MICRODISCECTOMY Bilateral 06/11/2019   Procedure: Bilateral Lumbar Four-FiveLaminectomy/Foraminotomy;  Surgeon: Earnie Larsson, MD;  Location: [facility];  Service: Neurosurgery;  Laterality: Bilateral;  posterior  . TEE WITHOUT CARDIOVERSION N/A 03/26/2020   Procedure: TRANSESOPHAGEAL ECHOCARDIOGRAM (TEE);  Surgeon: Jerline Pain, MD;  Location: [facility] ENDOSCOPY;  Service: Cardiovascular;  Laterality: N/A;    Family History  Problem Relation Age of Onset  . Prostate cancer Brother   . Heart disease Maternal Grandmother   . Brain cancer Mother     Social History:  reports that he has never smoked. He has never used smokeless tobacco. He reports previous alcohol use. He reports that he does not use drugs.  Allergies:  Allergies  Allergen Reactions  . Atorvastatin Other (See Comments)    Urinary retention   . Nebivolol Diarrhea    Medications: I have reviewed the patient's current medications.  Results for orders placed or performed during the hospital encounter of 03/19/20 (from the past 48 hour(s))  Glucose, capillary     Status: None   Collection Time: 04/01/20  4:16 PM  Result Value Ref Range   Glucose-Capillary 75 70 - 99 mg/dL    Comment: Glucose reference range applies only to samples taken after fasting for at least 8 hours.  Glucose, capillary     Status: Abnormal   Collection Time: 04/01/20  8:57 PM  Result Value Ref Range   Glucose-Capillary 162 (H) 70 - 99 mg/dL    Comment: Glucose reference range applies only to samples taken after fasting for at least 8 hours.  CBC with Differential/Platelet     Status: Abnormal   Collection Time: 04/02/20  4:55 AM  Result Value Ref Range   WBC 10.5 4.0 - 10.5 K/uL   RBC 2.63 (L) 4.22 - 5.81 MIL/uL   Hemoglobin  7.8 (L) 13.0 - 17.0 g/dL   HCT 25.0 (L) 39.0 - 52.0 %   MCV 95.1 80.0 - 100.0 fL   MCH 29.7 26.0 - 34.0 pg   MCHC 31.2 30.0 - 36.0 g/dL   RDW 15.6 (H) 11.5 - 15.5 %   Platelets 276 150 - 400 K/uL   nRBC 0.0 0.0 - 0.2 %   Neutrophils Relative % 84 %   Neutro Abs 8.8 (H) 1.7 - 7.7 K/uL   Lymphocytes Relative 8 %   Lymphs Abs 0.9 0.7 - 4.0 K/uL   Monocytes Relative 6 %   Monocytes Absolute 0.6 0.1 - 1.0 K/uL   Eosinophils Relative 1 %   Eosinophils Absolute 0.1 0.0 - 0.5 K/uL   Basophils Relative 0 %   Basophils Absolute 0.0 0.0 - 0.1 K/uL   Immature Granulocytes 1 %   Abs Immature Granulocytes 0.07 0.00 - 0.07 K/uL    Comment: Performed at [facility], [address] [place], [place] [id number]  Basic metabolic panel     Status: Abnormal   Collection Time: 04/02/20  4:55 AM  Result Value Ref Range   Sodium 135 135 - 145 mmol/L   Potassium 4.2 3.5 - 5.1  mmol/L   Chloride 101 98 - 111 mmol/L   CO2 27 22 - 32 mmol/L   Glucose, Bld 245 (H) 70 - 99 mg/dL    Comment: Glucose reference range applies only to samples taken after fasting for at least 8 hours.   BUN 17 8 - 23 mg/dL   Creatinine, Ser 1.52 (H) 0.61 - 1.24 mg/dL   Calcium 8.9 8.9 - 10.3 mg/dL   GFR calc non Af Amer 47 (L) >60 mL/min   GFR calc Af Amer 54 (L) >60 mL/min   Anion gap 7 5 - 15    Comment: Performed at [facility] [address] [place], [place] [address]  Type and screen [place]     Status: None   Collection Time: 04/02/20  4:55 AM  Result Value Ref Range   ABO/RH(D) A POS    Antibody Screen NEG    Sample Expiration      04/05/2020,2359 Performed at [facility], [place] [address], [place], [place] [address]   Glucose, capillary     Status: Abnormal   Collection Time: 04/02/20  6:36 AM  Result Value Ref Range   Glucose-Capillary 193 (H) 70 - 99 mg/dL    Comment: Glucose reference range applies only to samples taken after fasting for at least 8 hours.  Glucose,  capillary     Status: Abnormal   Collection Time: 04/02/20 11:14 AM  Result Value Ref Range   Glucose-Capillary 208 (H) 70 - 99 mg/dL    Comment: Glucose reference range applies only to samples taken after fasting for at least 8 hours.  Glucose, capillary     Status: Abnormal   Collection Time: 04/02/20  4:24 PM  Result Value Ref Range   Glucose-Capillary 126 (H) 70 - 99 mg/dL    Comment: Glucose reference range applies only to samples taken after fasting for at least 8 hours.  Glucose, capillary     Status: None   Collection Time: 04/02/20  8:59 PM  Result Value Ref Range   Glucose-Capillary 88 70 - 99 mg/dL    Comment: Glucose reference range applies only to samples taken after fasting for at least 8 hours.  CBC with Differential/Platelet     Status: Abnormal   Collection Time: 04/03/20  5:00 AM  Result Value Ref Range   WBC 11.1 (H) 4.0 - 10.5 K/uL   RBC 2.59 (L) 4.22 - 5.81 MIL/uL   Hemoglobin 7.8 (L) 13.0 - 17.0 g/dL   HCT 24.5 (L) 39.0 - 52.0 %   MCV 94.6 80.0 - 100.0 fL   MCH 30.1 26.0 - 34.0 pg   MCHC 31.8 30.0 - 36.0 g/dL   RDW 15.3 11.5 - 15.5 %   Platelets 298 150 - 400 K/uL   nRBC 0.0 0.0 - 0.2 %   Neutrophils Relative % 82 %   Neutro Abs 9.2 (H) 1.7 - 7.7 K/uL   Lymphocytes Relative 10 %   Lymphs Abs 1.1 0.7 - 4.0 K/uL   Monocytes Relative 6 %   Monocytes Absolute 0.7 0.1 - 1.0 K/uL   Eosinophils Relative 1 %   Eosinophils Absolute 0.1 0.0 - 0.5 K/uL   Basophils Relative 0 %   Basophils Absolute 0.1 0.0 - 0.1 K/uL   Immature Granulocytes 1 %   Abs Immature Granulocytes 0.06 0.00 - 0.07 K/uL    Comment: Performed at [facility], [address], [place], [place] [id number]  Basic metabolic  panel     Status: Abnormal   Collection Time: 04/03/20  5:00 AM  Result Value Ref Range   Sodium 137 135 - 145 mmol/L   Potassium 4.1 3.5 - 5.1 mmol/L   Chloride 101 98 - 111 mmol/L   CO2 27 22 - 32 mmol/L   Glucose, Bld 114 (H) 70 - 99 mg/dL    Comment: Glucose  reference range applies only to samples taken after fasting for at least 8 hours.   BUN 16 8 - 23 mg/dL   Creatinine, Ser 1.36 (H) 0.61 - 1.24 mg/dL   Calcium 9.0 8.9 - 10.3 mg/dL   GFR calc non Af Amer 53 (L) >60 mL/min   GFR calc Af Amer >60 >60 mL/min   Anion gap 9 5 - 15    Comment: Performed at [facility] [address] [place], [place] [address]  Glucose, capillary     Status: Abnormal   Collection Time: 04/03/20  6:35 AM  Result Value Ref Range   Glucose-Capillary 122 (H) 70 - 99 mg/dL    Comment: Glucose reference range applies only to samples taken after fasting for at least 8 hours.  Glucose, capillary     Status: Abnormal   Collection Time: 04/03/20 11:09 AM  Result Value Ref Range   Glucose-Capillary 277 (H) 70 - 99 mg/dL    Comment: Glucose reference range applies only to samples taken after fasting for at least 8 hours.    MR LUMBAR SPINE WO CONTRAST  Result Date: 04/02/2020 CLINICAL DATA:  For comparison initial evaluation for acute low back pain, recent surgery, infection suspected. EXAM: MRI LUMBAR SPINE WITHOUT CONTRAST TECHNIQUE: Multiplanar, multisequence MR imaging of the lumbar spine was performed. No intravenous contrast was administered. COMPARISON:  None. FINDINGS: Segmentation: Standard. Lowest well-formed disc space labeled the L5-S1 level. Alignment: Physiologic with preservation of the normal lumbar lordosis. No listhesis. Vertebrae: Abnormal T2/STIR signal intensity seen throughout the L4-5 interspace with associated progressive disc space height loss as compared to previous. Associated abnormal marrow edema with mild endplate irregularity within the adjacent L4 and L5 vertebral bodies. Findings consistent with acute osteomyelitis discitis. Irregular heterogeneous T1/T2 signal intensity seen within the ventral epidural space at this level could reflect postoperative changes and/or epidural phlegmon/abscess (series 2, image 8). Mild reactive marrow edema  with small joint effusion seen about the left greater than right L4-5 facets as well, which could also be related to infection and/or arthritic changes. No other evidence for acute infection within the lumbar spine. No evidence for acute or chronic fracture. Underlying bone marrow signal intensity mildly heterogeneous but within normal limits. No worrisome osseous lesions. Conus medullaris and cauda equina: Conus extends to the L1 level. Conus and cauda equina appear normal. Paraspinal and other soft tissues: Soft tissue edema seen throughout the lower posterior paraspinous soft tissues, which could be related to postoperative changes and/or infection/myositis. Abnormal edema seen within the psoas musculature bilaterally as well, likely related to infection. Few small superimposed soft tissue collections present within both psoas muscles, largest of which measures approximately 11 mm on the right (series 5, image 29), consistent with small abscesses. Few small cyst noted within the kidneys bilaterally. Visualized visceral structures otherwise unremarkable. Disc levels: L1-2: Negative interspace. Mild facet hypertrophy. No canal or foraminal stenosis. L2-3: Mild diffuse disc bulge with disc desiccation. Disc bulge asymmetric to the right with associated mild reactive endplate changes. Moderate facet and ligament flavum hypertrophy. Resultant mild spinal stenosis. Foramina remain  patent. L3-4: Mild disc bulge with disc desiccation. Superimposed broad right extraforaminal disc protrusion (series 5, image 19). Moderate facet and ligament flavum hypertrophy. Resultant moderate canal with bilateral subarticular stenosis. Mild bilateral L3 foraminal narrowing. L4-5: Findings concerning for osteomyelitis discitis as above. Sequelae of recent posterior decompression. Residual left greater than right facet degeneration. Persistent moderate spinal stenosis, improved as compared to preoperative exam. Moderate right with severe  left L4 foraminal narrowing. L5-S1: Diffuse disc bulge with disc desiccation and mild intervertebral disc space narrowing. Mild reactive endplate changes. Moderate facet and ligament flavum hypertrophy. Resultant moderate bilateral subarticular stenosis. Moderate right worse than left L5 foraminal narrowing. IMPRESSION: 1. Findings consistent with acute osteomyelitis discitis at L4-5. Heterogeneous signal intensity within the adjacent ventral epidural space could reflect epidural abscess and/or phlegmon or possibly disc material. Sequelae of recent posterior decompression at this level with persistent moderate spinal stenosis. 2. Evidence for associated soft tissue infection/cellulitis within the psoas musculature bilaterally with a few small superimposed abscesses as above. Edema throughout the posterior paraspinous musculature could reflect postoperative changes and/or myositis/infection. 3. Additional multilevel degenerative spondylosis as above, most pronounced at L3-4 where there is resultant moderate spinal stenosis, otherwise stable. Electronically Signed   By: Jeannine Boga M.D.   On: 04/02/2020 18:56    Review of Systems  Constitutional: Positive for activity change, appetite change and fatigue. Negative for chills, diaphoresis and fever.  HENT: Negative.   Eyes: Negative.   Respiratory: Negative for cough, shortness of breath and wheezing.   Cardiovascular: Positive for leg swelling. Negative for chest pain and palpitations.  Gastrointestinal: Negative for abdominal distention, abdominal pain, diarrhea, nausea and vomiting.  Endocrine: Negative.   Genitourinary: Negative for decreased urine volume, difficulty urinating, dysuria, flank pain, hematuria and urgency.  Musculoskeletal: Positive for arthralgias, back pain, gait problem, joint swelling, myalgias and neck pain. Negative for neck stiffness.  Skin: Negative.   Allergic/Immunologic: Negative.   Neurological: Positive for  weakness, numbness and headaches. Negative for dizziness and speech difficulty.  Hematological: Negative.   Psychiatric/Behavioral: Negative.    Blood pressure 133/78, pulse 87, temperature 98.3 F (36.8 C), temperature source Oral, resp. rate 18, height 5\' 10"  (1.778 m), weight 80.7 kg, SpO2 99 %. Physical Exam  Constitutional: He is oriented to person, place, and time. He appears well-developed and well-nourished.  HENT:  Head: Normocephalic and atraumatic.  Eyes: Pupils are equal, round, and reactive to light. Conjunctivae and EOM are normal.  Cardiovascular: Normal rate and regular rhythm.  Respiratory: Effort normal. No respiratory distress.  GI: Soft. He exhibits no distension. There is no abdominal tenderness.  Musculoskeletal:     Cervical back: Normal range of motion and neck supple.     Right knee: Swelling present. No erythema. Decreased range of motion. Tenderness present.     Left knee: Swelling present. No erythema. Decreased range of motion. Tenderness present.  Neurological: He is alert and oriented to person, place, and time. No cranial nerve deficit or sensory deficit.  Right hip flexor 4-/5, Right dorsiflexion 4/5, Right plantar flexion 4/5 Left hip flexor 3/5, Left dorsiflexion 4/5, Left plantar flexion 4/5 Knee extension too painful to accurately assess bilaterally  Skin: Skin is warm and dry.  Psychiatric: His speech is normal and behavior is normal. Judgment and thought content normal. Cognition and memory are normal. He exhibits a depressed mood.    Assessment/Plan: Patient with osteomyelitis discitis at L4-5 with some degree of ventral epidural abscess. Recommend CT guided aspiration of the L4-5  disc space by Interventional Radiology for organism identification and optimal IV antibiotic management. There is no indication for surgical intervention at present. A lumbosacral corset has been order for patient to don when out of bed for comfort.  Patricia Nettle 04/03/2020, 2:48 PM

## 2020-04-03 NOTE — Progress Notes (Signed)
Patients HR increased while ambulating and caused MEWS to change from green to yellow. After rest, patient returned to green. Will continue to monitor.

## 2020-04-03 NOTE — Progress Notes (Signed)
Patient is a 69 year old male status post bilateral L4-5 decompressive laminotomies approximately 11 months ago.  Patient presented with increasing back pain, lethargy and renal failure.  Work-up is now demonstrated an L4-5 osteomyelitis discitis with some degree of ventral epidural abscess.  At this point I would recommend aspiration of his L4-5 disc space for organism identification and optimal IV antibiotic management.  No indication for surgical intervention as things stand at present.  Full consult to follow.

## 2020-04-03 NOTE — Progress Notes (Signed)
PROGRESS NOTE    Johnathan Murray  [id number] DOB: 07/29/1951 DOA: 03/19/2020 PCP: Nicoletta Dress, MD    Brief Narrative:  69 y.o.malewithhistory of paroxysmal atrial fibrillation, hypertension, bioprosthetic aortic valve replacement who had undergone recent cervical discectomy and fusion about 3 weeks ago done by Dr. Trenton Gammon has been feeling weak with increasing pain in the neck and low back and poor appetite. Denies any vomiting or diarrhea denies taking any Tylenol but does take NSAIDs sometimes. Has not noticed any blood in the urine and has been urinating less last few days. Has subjective feeling of fever chills. Given the symptoms patient presents to the ER. Patient states over the last 3 weeks and surgery patient has been finding it difficult to ambulate because of the weakness of the lower extremity. Denies any incontinence of urine or bowel. Has been having more lower extremity edema more on the left side. In the ER on exam patient appears generally weak with weakness of the lower extremity. There is edema of the lower extremity on the left side. Labs show creatinine of 6.4 which is increased from 1.6 about 3 weeks ago. In addition patient's LFTs are markedly elevated at AST of 415 and ALT of 429 patient's albumin is 2.6 total bilirubin 5.5 bicarb 19 sodium 130 calcium 10.2 hemoglobin 10.4 lactic acid 1.6 WBC 12.6 platelets 341 urinalysis shows RBCs of 21-50 WBC 21-50 protein around 100 bacteria rare hyaline cast present mucus present. Patient was given fluid bolus and admitted for acute renal failure. Patient also had CT abdomen pelvis and CT cervical spine which did not show any acute. USS of the abdomen also was unremarkable. Covid test is negative  Assessment & Plan:   Principal Problem:   Enterococcal bacteremia Active Problems:   Essential hypertension   PAF (paroxysmal atrial fibrillation) (HCC)   Abnormal LFTs (liver function tests)   Cervical myelopathy  (HCC)   S/P aortic valve replacement   ARF (acute renal failure) (HCC)   Pseudogout of left knee   Septic arthritis (HCC)   History of fusion of cervical spine   Protein-calorie malnutrition, severe   AKI on CKD stage III/anion gap metabolic acidosis Stable Multifactorial from bacteremia, poor oral intake, in addition to medication induced from NSAID, Lasix and lisinopril use Baseline creatinine 1.3-1.6, on presentation 6.40 Noted urine with RBCs and protein-further work-up Per nephrology Held patient's Lasix, lisinopril CT abdomen does not show any obstruction Nephrology consulted earlier, since signed off Daily BMP  Sepsis likely 2/2 Enterococcus faecalis bacteremia On presentation, patient was initially hypotensive, with leukocytosis Source from possible knee infection vs recent postoperative cervical/lumbar infection BC x2 growing Enterococcus faecalis, repeat BC x2 NGTD UC no growth, Chest x-ray unremarkable Lactic acid, TSH WNL, cortisol WNL TTE showed no obvious vegetation but increased transaortic gradients across the bioprosthetic valve  Cardiology was consulted for TEE. Pt underwent TEE 5/6. Valves unremarkable, no vegetation seen. Discussed with Cardiology ID recs noted. Plan to complete ampicillin for at least 4 weeks with outpt f/u with ID on 04/16/20.  ID has signed off on 5/7 PICC placed 5/9  Acute osteomyelitis discitis of L4-L5  MRI showed above with possible epidural abscess Neurosurgery consulted, recommend CT-guided aspiration by IR.  No indication for surgical intervention at present.  Recommend lumbosacral corset when out of bed for comfort IR consulted, appreciate recs Continue IV antibiotics as above  Likely LLE septic knee with ruptured Baker's cyst with possibly pseudogout Doppler showed ruptured Baker's cyst on LLE Left knee  x-ray showed large joint effusion, septic joint cannot be excluded Orthopedics consulted s/p LLE joint aspiration on 03/21/2020  and RLE joint aspiration on 03/22/2020 both showed positive intracellular calcium pyrophosphate crystals, WBC over 15,000, culture grew rare E faecalis from LLE, culture from RLE without organisms Status post steroid injection in both knees on 03/22/2020 by orthopedics Now s/p I&D of bilateral knees on 03/24/2020, stable at this time Pt agreeable for CIR, pending bed availability  Hyperglycemia CBGs have been stable Likely 2/2 recent steroid injection in the knee Continue with SSI, Accu-Cheks, hypoglycemic protocol  Transaminitis Suspect secondary to above sepsis/hypotension/shock liver INR WNL Acute hepatitis panel nonreactive Home amiodarone currently on hold LFT's had been trending towards normal  Paroxysmal A. Fib/bioprosthetic aortic valve replacement Heart rate WNL Holding home amiodarone TTE as noted above Continue Eliquis  Neck pain History of recent cervical decompression surgery Imaging does not show anything acute as mentioned above MRI cervical spine with no evidence of infection PT/OT consulted, recs noted for CIR. Pending placement and bed availability  Normocytic anemia/postop anemia Anemia panel iron 43, sats 29, ferritin 3,811 Continued on oral iron supplementation Hgb down to 6.8 recently s/p 2 units PRBC's No evidence of acute blood loss Hgb has been downtrending, while on eliquis  Hypothyroidism TSH levels WNL Continue Synthroid as pt tolerates     DVT prophylaxis: Eliquis Code Status: Full Family Communication: Pt in room, wife at bedside  Status is: Inpatient  Remains inpatient appropriate because:Ongoing diagnostic testing needed not appropriate for outpatient work up and Unsafe d/c plan, need further w/u with IR   Dispo: The patient is from: Home              Anticipated d/c is to: CIR              Anticipated d/c date is: 1 day, awaiting CIR placement likely on 04/06/20              Patient currently is not medically stable to  d/c.  Consultants:   ID  Orthopedic Surgery  Critical Care  Nephrology  Neurosurgery  IR  Procedures:   TEE 5/6  Antimicrobials: Anti-infectives (From admission, onward)   Start     Dose/Rate Route Frequency Ordered Stop   03/27/20 0900  cefTRIAXone (ROCEPHIN) 2 g in sodium chloride 0.9 % 100 mL IVPB  Status:  Discontinued     2 g 200 mL/hr over 30 Minutes Intravenous Every 12 hours 03/27/20 0828 03/27/20 0901   03/22/20 1515  cefTRIAXone (ROCEPHIN) 2 g in sodium chloride 0.9 % 100 mL IVPB  Status:  Discontinued     2 g 200 mL/hr over 30 Minutes Intravenous Every 12 hours 03/22/20 1442 03/26/20 1043   03/22/20 1000  ampicillin (OMNIPEN) 2 g in sodium chloride 0.9 % 100 mL IVPB     2 g 300 mL/hr over 20 Minutes Intravenous Every 6 hours 03/22/20 0925 04/21/20 2359   03/21/20 1800  ceFEPIme (MAXIPIME) 1 g in sodium chloride 0.9 % 100 mL IVPB  Status:  Discontinued     1 g 200 mL/hr over 30 Minutes Intravenous Every 24 hours 03/20/20 1143 03/21/20 0116   03/21/20 0200  ampicillin (OMNIPEN) 2 g in sodium chloride 0.9 % 100 mL IVPB  Status:  Discontinued     2 g 300 mL/hr over 20 Minutes Intravenous Every 8 hours 03/21/20 0116 03/22/20 0925   03/20/20 1230  ceFEPIme (MAXIPIME) 1 g in sodium chloride 0.9 % 100 mL IVPB  1 g 200 mL/hr over 30 Minutes Intravenous  Once 03/20/20 1143 03/20/20 1358      Subjective: Patient still complains of back and bilateral knee pain, worse on the right.  Patient denies any chest pain, shortness of breath, abdominal pain, nausea/vomiting, fever/chills.    Objective: Vitals:   04/03/20 0436 04/03/20 0943 04/03/20 1107 04/03/20 1408  BP: 126/84 130/87 109/73 133/78  Pulse: 88 (!) 111 (!) 101 87  Resp: 18 18 18 18   Temp: 98.5 F (36.9 C) 98.4 F (36.9 C) 98.2 F (36.8 C) 98.3 F (36.8 C)  TempSrc: Oral Oral Oral Oral  SpO2: 100% 100% 100% 99%  Weight: 80.7 kg     Height:        Intake/Output Summary (Last 24 hours) at  04/03/2020 1554 Last data filed at 04/03/2020 1300 Gross per 24 hour  Intake 1513.53 ml  Output 2570 ml  Net -1056.47 ml   Filed Weights   03/30/20 2135 04/01/20 2056 04/03/20 0436  Weight: 78 kg 78.1 kg 80.7 kg    Examination:  General: NAD   Cardiovascular: S1, S2 present  Respiratory: CTAB  Abdomen: Soft, nontender, nondistended, bowel sounds present  Musculoskeletal: No bilateral pedal edema noted, bilateral knee incisions healing, with tenderness worse on the right, as well as low back pain  Skin: Normal  Psychiatry: Normal mood   Data Reviewed: I have personally reviewed following labs and imaging studies  CBC: Recent Labs  Lab 03/30/20 0303 03/30/20 0303 03/31/20 0254 03/31/20 1702 04/01/20 0430 04/02/20 0455 04/03/20 0500  WBC 12.4*   < > 10.9* 12.8* 9.4 10.5 11.1*  NEUTROABS 10.2*  --  8.8*  --  7.5 8.8* 9.2*  HGB 8.3*   < > 7.7* 8.3* 7.5* 7.8* 7.8*  HCT 25.8*   < > 24.5* 26.0* 23.6* 25.0* 24.5*  MCV 95.9   < > 95.3 95.6 96.3 95.1 94.6  PLT 259   < > 244 270 251 276 298   < > = values in this interval not displayed.   Basic Metabolic Panel: Recent Labs  Lab 03/30/20 0303 03/31/20 0254 04/01/20 0430 04/02/20 0455 04/03/20 0500  NA 136 136 136 135 137  K 3.7 4.1 4.0 4.2 4.1  CL 103 101 101 101 101  CO2 25 25 26 27 27   GLUCOSE 177* 185* 151* 245* 114*  BUN 17 17 17 17 16   CREATININE 1.45* 1.36* 1.37* 1.52* 1.36*  CALCIUM 8.6* 8.6* 8.6* 8.9 9.0   GFR: Estimated Creatinine Clearance: 54.4 mL/min (A) (by C-G formula based on SCr of 1.36 mg/dL (H)). Liver Function Tests: Recent Labs  Lab 03/28/20 0532 03/29/20 0444 03/30/20 0303 04/01/20 0430  AST 25 38 49* 99*  ALT 73* 72* 77* 118*  ALKPHOS 75 65 76 79  BILITOT 1.8* 1.7* 1.7* 1.4*  PROT 5.7* 5.9* 5.7* 5.6*  ALBUMIN 1.9* 2.0* 2.0* 1.8*   No results for input(s): LIPASE, AMYLASE in the last 168 hours. No results for input(s): AMMONIA in the last 168 hours. Coagulation Profile: No  results for input(s): INR, PROTIME in the last 168 hours. Cardiac Enzymes: No results for input(s): CKTOTAL, CKMB, CKMBINDEX, TROPONINI in the last 168 hours. BNP (last 3 results) No results for input(s): PROBNP in the last 8760 hours. HbA1C: No results for input(s): HGBA1C in the last 72 hours. CBG: Recent Labs  Lab 04/02/20 1114 04/02/20 1624 04/02/20 2059 04/03/20 0635 04/03/20 1109  GLUCAP 208* 126* 88 122* 277*   Lipid Profile: No results for  input(s): CHOL, HDL, LDLCALC, TRIG, CHOLHDL, LDLDIRECT in the last 72 hours. Thyroid Function Tests: No results for input(s): TSH, T4TOTAL, FREET4, T3FREE, THYROIDAB in the last 72 hours. Anemia Panel: No results for input(s): VITAMINB12, FOLATE, FERRITIN, TIBC, IRON, RETICCTPCT in the last 72 hours. Sepsis Labs: No results for input(s): PROCALCITON, LATICACIDVEN in the last 168 hours.  Recent Results (from the past 240 hour(s))  Culture, blood (Routine X 2) w Reflex to ID Panel     Status: None   Collection Time: 03/26/20  9:15 PM   Specimen: BLOOD  Result Value Ref Range Status   Specimen Description BLOOD RIGHT ARM  Final   Special Requests   Final    BOTTLES DRAWN AEROBIC AND ANAEROBIC Blood Culture results may not be optimal due to an excessive volume of blood received in culture bottles   Culture   Final    NO GROWTH 5 DAYS Performed at [facility], [place] [address] [place], [place] [address]    Report Status 03/31/2020 FINAL  Final  Culture, blood (Routine X 2) w Reflex to ID Panel     Status: None   Collection Time: 03/26/20  9:25 PM   Specimen: BLOOD  Result Value Ref Range Status   Specimen Description BLOOD LEFT FOREARM  Final   Special Requests   Final    BOTTLES DRAWN AEROBIC AND ANAEROBIC Blood Culture results may not be optimal due to an excessive volume of blood received in culture bottles   Culture   Final    NO GROWTH 5 DAYS Performed at [facility], [place] [address], [place], [place] [address]     Report Status 03/31/2020 FINAL  Final     Radiology Studies: MR LUMBAR SPINE WO CONTRAST  Result Date: 04/02/2020 CLINICAL DATA:  For comparison initial evaluation for acute low back pain, recent surgery, infection suspected. EXAM: MRI LUMBAR SPINE WITHOUT CONTRAST TECHNIQUE: Multiplanar, multisequence MR imaging of the lumbar spine was performed. No intravenous contrast was administered. COMPARISON:  None. FINDINGS: Segmentation: Standard. Lowest well-formed disc space labeled the L5-S1 level. Alignment: Physiologic with preservation of the normal lumbar lordosis. No listhesis. Vertebrae: Abnormal T2/STIR signal intensity seen throughout the L4-5 interspace with associated progressive disc space height loss as compared to previous. Associated abnormal marrow edema with mild endplate irregularity within the adjacent L4 and L5 vertebral bodies. Findings consistent with acute osteomyelitis discitis. Irregular heterogeneous T1/T2 signal intensity seen within the ventral epidural space at this level could reflect postoperative changes and/or epidural phlegmon/abscess (series 2, image 8). Mild reactive marrow edema with small joint effusion seen about the left greater than right L4-5 facets as well, which could also be related to infection and/or arthritic changes. No other evidence for acute infection within the lumbar spine. No evidence for acute or chronic fracture. Underlying bone marrow signal intensity mildly heterogeneous but within normal limits. No worrisome osseous lesions. Conus medullaris and cauda equina: Conus extends to the L1 level. Conus and cauda equina appear normal. Paraspinal and other soft tissues: Soft tissue edema seen throughout the lower posterior paraspinous soft tissues, which could be related to postoperative changes and/or infection/myositis. Abnormal edema seen within the psoas musculature bilaterally as well, likely related to infection. Few small superimposed soft tissue  collections present within both psoas muscles, largest of which measures approximately 11 mm on the right (series 5, image 29), consistent with small abscesses. Few small cyst noted within the kidneys bilaterally. Visualized visceral structures otherwise unremarkable. Disc levels:  L1-2: Negative interspace. Mild facet hypertrophy. No canal or foraminal stenosis. L2-3: Mild diffuse disc bulge with disc desiccation. Disc bulge asymmetric to the right with associated mild reactive endplate changes. Moderate facet and ligament flavum hypertrophy. Resultant mild spinal stenosis. Foramina remain patent. L3-4: Mild disc bulge with disc desiccation. Superimposed broad right extraforaminal disc protrusion (series 5, image 19). Moderate facet and ligament flavum hypertrophy. Resultant moderate canal with bilateral subarticular stenosis. Mild bilateral L3 foraminal narrowing. L4-5: Findings concerning for osteomyelitis discitis as above. Sequelae of recent posterior decompression. Residual left greater than right facet degeneration. Persistent moderate spinal stenosis, improved as compared to preoperative exam. Moderate right with severe left L4 foraminal narrowing. L5-S1: Diffuse disc bulge with disc desiccation and mild intervertebral disc space narrowing. Mild reactive endplate changes. Moderate facet and ligament flavum hypertrophy. Resultant moderate bilateral subarticular stenosis. Moderate right worse than left L5 foraminal narrowing. IMPRESSION: 1. Findings consistent with acute osteomyelitis discitis at L4-5. Heterogeneous signal intensity within the adjacent ventral epidural space could reflect epidural abscess and/or phlegmon or possibly disc material. Sequelae of recent posterior decompression at this level with persistent moderate spinal stenosis. 2. Evidence for associated soft tissue infection/cellulitis within the psoas musculature bilaterally with a few small superimposed abscesses as above. Edema throughout  the posterior paraspinous musculature could reflect postoperative changes and/or myositis/infection. 3. Additional multilevel degenerative spondylosis as above, most pronounced at L3-4 where there is resultant moderate spinal stenosis, otherwise stable. Electronically Signed   By: Jeannine Boga M.D.   On: 04/02/2020 18:56    Scheduled Meds: . sodium chloride   Intravenous Once  . apixaban  5 mg Oral BID  . Chlorhexidine Gluconate Cloth  6 each Topical Daily  . docusate sodium  100 mg Oral BID  . feeding supplement (ENSURE ENLIVE)  237 mL Oral TID BM  . feeding supplement (PRO-STAT SUGAR FREE 64)  30 mL Oral BID  . insulin aspart  0-15 Units Subcutaneous TID WC  . insulin aspart  0-5 Units Subcutaneous QHS  . insulin aspart  3 Units Subcutaneous TID WC  . insulin glargine  10 Units Subcutaneous BID  . iron polysaccharides  150 mg Oral Q breakfast  . levothyroxine  25 mcg Oral Q0600  . midodrine  10 mg Oral TID WC  . senna  1 tablet Oral BID  . sodium chloride flush  10-40 mL Intracatheter Q12H   Continuous Infusions: . sodium chloride 250 mL (04/02/20 2230)  . ampicillin (OMNIPEN) IV 2 g (04/03/20 1111)     LOS: 14 days   Alma Friendly, MD [facility] Pager On Amion  If 7PM-7AM, please contact night-coverage 04/03/2020, 3:54 PM

## 2020-04-03 NOTE — Progress Notes (Signed)
Inpatient Rehabilitation-Admissions Coordinator   Per Neurosurgery note, there is recommendation for CT guided aspiration by IR. AC will hold off on potential admit this weekend and will follow up Monday to see if pt is medically ready for CIR.   Raechel Ache, OTR/L  Rehab Admissions Coordinator  [phone] 04/03/2020 3:43 PM

## 2020-04-04 DIAGNOSIS — B952 Enterococcus as the cause of diseases classified elsewhere: Secondary | ICD-10-CM | POA: Diagnosis not present

## 2020-04-04 DIAGNOSIS — I480 Paroxysmal atrial fibrillation: Secondary | ICD-10-CM | POA: Diagnosis not present

## 2020-04-04 DIAGNOSIS — R7881 Bacteremia: Secondary | ICD-10-CM | POA: Diagnosis not present

## 2020-04-04 DIAGNOSIS — I10 Essential (primary) hypertension: Secondary | ICD-10-CM | POA: Diagnosis not present

## 2020-04-04 DIAGNOSIS — N171 Acute kidney failure with acute cortical necrosis: Secondary | ICD-10-CM | POA: Diagnosis not present

## 2020-04-04 LAB — GLUCOSE, CAPILLARY
Glucose-Capillary: 183 mg/dL — ABNORMAL HIGH (ref 70–99)
Glucose-Capillary: 180 mg/dL — ABNORMAL HIGH (ref 70–99)
Glucose-Capillary: 172 mg/dL — ABNORMAL HIGH (ref 70–99)
Glucose-Capillary: 220 mg/dL — ABNORMAL HIGH (ref 70–99)

## 2020-04-04 LAB — CBC WITH DIFFERENTIAL/PLATELET
Abs Immature Granulocytes: 0.07 10*3/uL (ref 0.00–0.07)
Basophils Absolute: 0.1 10*3/uL (ref 0.0–0.1)
Basophils Relative: 1 %
Eosinophils Absolute: 0.1 10*3/uL (ref 0.0–0.5)
Eosinophils Relative: 1 %
HCT: 24.5 % — ABNORMAL LOW (ref 39.0–52.0)
Hemoglobin: 7.8 g/dL — ABNORMAL LOW (ref 13.0–17.0)
Immature Granulocytes: 1 %
Lymphocytes Relative: 11 %
Lymphs Abs: 1.2 10*3/uL (ref 0.7–4.0)
MCH: 29.8 pg (ref 26.0–34.0)
MCHC: 31.8 g/dL (ref 30.0–36.0)
MCV: 93.5 fL (ref 80.0–100.0)
Monocytes Absolute: 0.5 10*3/uL (ref 0.1–1.0)
Monocytes Relative: 5 %
Neutro Abs: 8.6 10*3/uL — ABNORMAL HIGH (ref 1.7–7.7)
Neutrophils Relative %: 81 %
Platelets: 319 10*3/uL (ref 150–400)
RBC: 2.62 MIL/uL — ABNORMAL LOW (ref 4.22–5.81)
RDW: 15.4 % (ref 11.5–15.5)
WBC: 10.6 10*3/uL — ABNORMAL HIGH (ref 4.0–10.5)
nRBC: 0 % (ref 0.0–0.2)

## 2020-04-04 LAB — BASIC METABOLIC PANEL
Anion gap: 7 (ref 5–15)
BUN: 18 mg/dL (ref 8–23)
CO2: 27 mmol/L (ref 22–32)
Calcium: 9 mg/dL (ref 8.9–10.3)
Chloride: 101 mmol/L (ref 98–111)
Creatinine, Ser: 1.43 mg/dL — ABNORMAL HIGH (ref 0.61–1.24)
GFR calc Af Amer: 58 mL/min — ABNORMAL LOW (ref >60)
GFR calc non Af Amer: 50 mL/min — ABNORMAL LOW (ref >60)
Glucose, Bld: 153 mg/dL — ABNORMAL HIGH (ref 70–99)
Potassium: 3.9 mmol/L (ref 3.5–5.1)
Sodium: 135 mmol/L (ref 135–145)

## 2020-04-04 MED ORDER — TRAZODONE HCL 50 MG PO TABS
50.0000 mg | ORAL_TABLET | Freq: Once | ORAL | Status: AC
  Administered 2020-04-04: 50 mg via ORAL
  Filled 2020-04-04: qty 1

## 2020-04-04 NOTE — Progress Notes (Signed)
   04/04/20 2222  Provider Notification  Provider Name/Title Blount NP  Date Provider Notified 04/04/20  Time Provider Notified 2222  Notification Type Page  Notification Reason Requested by patient/family (need something for sleep)  Response See new orders  Date of Provider Response 04/04/20  Time of Provider Response 2230   Patient requesting medication for sleep.  Blount NP made aware and order for Trazadone received and given.  Will continue to monitor patient.  Earleen Reaper RN

## 2020-04-04 NOTE — Progress Notes (Signed)
PROGRESS NOTE    Johnathan Murray  [id number] DOB: Jul 20, 1951 DOA: 03/19/2020 PCP: Nicoletta Dress, MD    Brief Narrative:  69 y.o.malewithhistory of paroxysmal atrial fibrillation, hypertension, bioprosthetic aortic valve replacement who had undergone recent cervical discectomy and fusion about 3 weeks ago done by Dr. Trenton Gammon has been feeling weak with increasing pain in the neck and low back and poor appetite. Denies any vomiting or diarrhea denies taking any Tylenol but does take NSAIDs sometimes. Has not noticed any blood in the urine and has been urinating less last few days. Has subjective feeling of fever chills. Given the symptoms patient presents to the ER. Patient states over the last 3 weeks and surgery patient has been finding it difficult to ambulate because of the weakness of the lower extremity. Denies any incontinence of urine or bowel. Has been having more lower extremity edema more on the left side. In the ER on exam patient appears generally weak with weakness of the lower extremity. There is edema of the lower extremity on the left side. Labs show creatinine of 6.4 which is increased from 1.6 about 3 weeks ago. In addition patient's LFTs are markedly elevated at AST of 415 and ALT of 429 patient's albumin is 2.6 total bilirubin 5.5 bicarb 19 sodium 130 calcium 10.2 hemoglobin 10.4 lactic acid 1.6 WBC 12.6 platelets 341 urinalysis shows RBCs of 21-50 WBC 21-50 protein around 100 bacteria rare hyaline cast present mucus present. Patient was given fluid bolus and admitted for acute renal failure. Patient also had CT abdomen pelvis and CT cervical spine which did not show any acute. USS of the abdomen also was unremarkable. Covid test is negative  Assessment & Plan:   Principal Problem:   Enterococcal bacteremia Active Problems:   Essential hypertension   PAF (paroxysmal atrial fibrillation) (HCC)   Abnormal LFTs (liver function tests)   Cervical myelopathy  (HCC)   S/P aortic valve replacement   ARF (acute renal failure) (HCC)   Pseudogout of left knee   Septic arthritis (HCC)   History of fusion of cervical spine   Protein-calorie malnutrition, severe   AKI on CKD stage III/anion gap metabolic acidosis Stable Multifactorial from bacteremia, poor oral intake, in addition to medication induced from NSAID, Lasix and lisinopril use Baseline creatinine 1.3-1.6, on presentation 6.40 Noted urine with RBCs and protein-further work-up Per nephrology Held patient's Lasix, lisinopril CT abdomen does not show any obstruction Nephrology consulted earlier, since signed off Daily BMP  Sepsis likely 2/2 Enterococcus faecalis bacteremia On presentation, patient was initially hypotensive, with leukocytosis Source from possible knee infection vs recent postoperative cervical/lumbar infection BC x2 growing Enterococcus faecalis, repeat BC x2 NGTD UC no growth, Chest x-ray unremarkable Lactic acid, TSH WNL, cortisol WNL TTE showed no obvious vegetation but increased transaortic gradients across the bioprosthetic valve  Cardiology was consulted for TEE. Pt underwent TEE 5/6. Valves unremarkable, no vegetation seen. Discussed with Cardiology ID recs noted. Plan to complete ampicillin for at least 4 weeks with outpt f/u with ID on 04/16/20.  ID has signed off on 5/7 PICC placed 5/9  Acute osteomyelitis discitis of L4-L5  MRI showed above with possible epidural abscess Neurosurgery consulted, recommend disc aspiration by IR.  No indication for surgical intervention at present.  Recommend lumbosacral corset when out of bed for comfort IR consulted, appreciate recs, plan for aspiration on Monday ID re-consulted, appreciate recs Continue IV antibiotics as above  Likely LLE septic knee with ruptured Baker's cyst with possibly pseudogout  Doppler showed ruptured Baker's cyst on LLE Left knee x-ray showed large joint effusion, septic joint cannot be  excluded Orthopedics consulted s/p LLE joint aspiration on 03/21/2020 and RLE joint aspiration on 03/22/2020 both showed positive intracellular calcium pyrophosphate crystals, WBC over 15,000, culture grew rare E faecalis from LLE, culture from RLE without organisms Status post steroid injection in both knees on 03/22/2020 by orthopedics Now s/p I&D of bilateral knees on 03/24/2020, stable at this time Pt agreeable for CIR, pending bed availability  Hyperglycemia CBGs have been stable Likely 2/2 recent steroid injection in the knee Continue with SSI, Accu-Cheks, hypoglycemic protocol  Transaminitis Suspect secondary to above sepsis/hypotension/shock liver INR WNL Acute hepatitis panel nonreactive Home amiodarone currently on hold LFT's had been trending towards normal  Paroxysmal A. Fib/bioprosthetic aortic valve replacement Heart rate WNL Holding home amiodarone TTE as noted above Continue Eliquis  Neck pain History of recent cervical decompression surgery Imaging does not show anything acute as mentioned above MRI cervical spine with no evidence of infection PT/OT consulted, recs noted for CIR. Pending placement and bed availability  Normocytic anemia/postop anemia Anemia panel iron 43, sats 29, ferritin 3,811 Continued on oral iron supplementation Hgb down to 6.8 recently s/p 2 units PRBC's No evidence of acute blood loss Hgb has been downtrending, while on eliquis  Hypothyroidism TSH levels WNL Continue Synthroid as pt tolerates     DVT prophylaxis: Eliquis Code Status: Full Family Communication: Pt in room, wife at bedside  Status is: Inpatient  Remains inpatient appropriate because:Ongoing diagnostic testing needed not appropriate for outpatient work up and Unsafe d/c plan, need further w/u with IR   Dispo: The patient is from: Home              Anticipated d/c is to: CIR              Anticipated d/c date is: 1 day, awaiting CIR placement likely on 04/06/20               Patient currently is not medically stable to d/c.  Consultants:   ID  Orthopedic Surgery  Critical Care  Nephrology  Neurosurgery  IR  Procedures:   TEE 5/6  Antimicrobials: Anti-infectives (From admission, onward)   Start     Dose/Rate Route Frequency Ordered Stop   03/27/20 0900  cefTRIAXone (ROCEPHIN) 2 g in sodium chloride 0.9 % 100 mL IVPB  Status:  Discontinued     2 g 200 mL/hr over 30 Minutes Intravenous Every 12 hours 03/27/20 0828 03/27/20 0901   03/22/20 1515  cefTRIAXone (ROCEPHIN) 2 g in sodium chloride 0.9 % 100 mL IVPB  Status:  Discontinued     2 g 200 mL/hr over 30 Minutes Intravenous Every 12 hours 03/22/20 1442 03/26/20 1043   03/22/20 1000  ampicillin (OMNIPEN) 2 g in sodium chloride 0.9 % 100 mL IVPB     2 g 300 mL/hr over 20 Minutes Intravenous Every 6 hours 03/22/20 0925 04/21/20 2359   03/21/20 1800  ceFEPIme (MAXIPIME) 1 g in sodium chloride 0.9 % 100 mL IVPB  Status:  Discontinued     1 g 200 mL/hr over 30 Minutes Intravenous Every 24 hours 03/20/20 1143 03/21/20 0116   03/21/20 0200  ampicillin (OMNIPEN) 2 g in sodium chloride 0.9 % 100 mL IVPB  Status:  Discontinued     2 g 300 mL/hr over 20 Minutes Intravenous Every 8 hours 03/21/20 0116 03/22/20 0925   03/20/20 1230  ceFEPIme (MAXIPIME) 1 g  in sodium chloride 0.9 % 100 mL IVPB     1 g 200 mL/hr over 30 Minutes Intravenous  Once 03/20/20 1143 03/20/20 1358      Subjective: Patient still complains of back and bilateral knee pain, worse on the right.  Patient denies any chest pain, shortness of breath, abdominal pain, nausea/vomiting, fever/chills.    Objective: Vitals:   04/03/20 2132 04/04/20 0430 04/04/20 0800 04/04/20 1423  BP: 114/71 126/68 123/76 133/72  Pulse: 87 82 94 91  Resp: 16 18    Temp: 99.8 F (37.7 C) 98.9 F (37.2 C)    TempSrc: Oral Oral    SpO2: 100% 98% 99% 100%  Weight:  81.9 kg    Height:        Intake/Output Summary (Last 24 hours) at  04/04/2020 1445 Last data filed at 04/04/2020 0517 Gross per 24 hour  Intake 1082.3 ml  Output 6800 ml  Net -5717.7 ml   Filed Weights   04/01/20 2056 04/03/20 0436 04/04/20 0430  Weight: 78.1 kg 80.7 kg 81.9 kg    Examination:  General: NAD   Cardiovascular: S1, S2 present  Respiratory: CTAB  Abdomen: Soft, nontender, nondistended, bowel sounds present  Musculoskeletal: No bilateral pedal edema noted, bilateral knee incisions healing, with tenderness worse on the right, as well as low back pain  Skin: Normal  Psychiatry: Normal mood   Data Reviewed: I have personally reviewed following labs and imaging studies  CBC: Recent Labs  Lab 03/31/20 0254 03/31/20 0254 03/31/20 1702 04/01/20 0430 04/02/20 0455 04/03/20 0500 04/04/20 0305  WBC 10.9*   < > 12.8* 9.4 10.5 11.1* 10.6*  NEUTROABS 8.8*  --   --  7.5 8.8* 9.2* 8.6*  HGB 7.7*   < > 8.3* 7.5* 7.8* 7.8* 7.8*  HCT 24.5*   < > 26.0* 23.6* 25.0* 24.5* 24.5*  MCV 95.3   < > 95.6 96.3 95.1 94.6 93.5  PLT 244   < > 270 251 276 298 319   < > = values in this interval not displayed.   Basic Metabolic Panel: Recent Labs  Lab 03/31/20 0254 04/01/20 0430 04/02/20 0455 04/03/20 0500 04/04/20 0305  NA 136 136 135 137 135  K 4.1 4.0 4.2 4.1 3.9  CL 101 101 101 101 101  CO2 25 26 27 27 27   GLUCOSE 185* 151* 245* 114* 153*  BUN 17 17 17 16 18   CREATININE 1.36* 1.37* 1.52* 1.36* 1.43*  CALCIUM 8.6* 8.6* 8.9 9.0 9.0   GFR: Estimated Creatinine Clearance: 51.8 mL/min (A) (by C-G formula based on SCr of 1.43 mg/dL (H)). Liver Function Tests: Recent Labs  Lab 03/29/20 0444 03/30/20 0303 04/01/20 0430  AST 38 49* 99*  ALT 72* 77* 118*  ALKPHOS 65 76 79  BILITOT 1.7* 1.7* 1.4*  PROT 5.9* 5.7* 5.6*  ALBUMIN 2.0* 2.0* 1.8*   No results for input(s): LIPASE, AMYLASE in the last 168 hours. No results for input(s): AMMONIA in the last 168 hours. Coagulation Profile: No results for input(s): INR, PROTIME in the  last 168 hours. Cardiac Enzymes: No results for input(s): CKTOTAL, CKMB, CKMBINDEX, TROPONINI in the last 168 hours. BNP (last 3 results) No results for input(s): PROBNP in the last 8760 hours. HbA1C: No results for input(s): HGBA1C in the last 72 hours. CBG: Recent Labs  Lab 04/03/20 1109 04/03/20 1611 04/03/20 2132 04/04/20 0638 04/04/20 1147  GLUCAP 277* 195* 132* 183* 180*   Lipid Profile: No results for input(s): CHOL, HDL, LDLCALC,  TRIG, CHOLHDL, LDLDIRECT in the last 72 hours. Thyroid Function Tests: No results for input(s): TSH, T4TOTAL, FREET4, T3FREE, THYROIDAB in the last 72 hours. Anemia Panel: No results for input(s): VITAMINB12, FOLATE, FERRITIN, TIBC, IRON, RETICCTPCT in the last 72 hours. Sepsis Labs: No results for input(s): PROCALCITON, LATICACIDVEN in the last 168 hours.  Recent Results (from the past 240 hour(s))  Culture, blood (Routine X 2) w Reflex to ID Panel     Status: None   Collection Time: 03/26/20  9:15 PM   Specimen: BLOOD  Result Value Ref Range Status   Specimen Description BLOOD RIGHT ARM  Final   Special Requests   Final    BOTTLES DRAWN AEROBIC AND ANAEROBIC Blood Culture results may not be optimal due to an excessive volume of blood received in culture bottles   Culture   Final    NO GROWTH 5 DAYS Performed at [facility], [place] [address] [place], [place] [address]    Report Status 03/31/2020 FINAL  Final  Culture, blood (Routine X 2) w Reflex to ID Panel     Status: None   Collection Time: 03/26/20  9:25 PM   Specimen: BLOOD  Result Value Ref Range Status   Specimen Description BLOOD LEFT FOREARM  Final   Special Requests   Final    BOTTLES DRAWN AEROBIC AND ANAEROBIC Blood Culture results may not be optimal due to an excessive volume of blood received in culture bottles   Culture   Final    NO GROWTH 5 DAYS Performed at [facility], [place] [address], [place], [place] [address]    Report Status 03/31/2020 FINAL  Final      Radiology Studies: MR LUMBAR SPINE WO CONTRAST  Result Date: 04/02/2020 CLINICAL DATA:  For comparison initial evaluation for acute low back pain, recent surgery, infection suspected. EXAM: MRI LUMBAR SPINE WITHOUT CONTRAST TECHNIQUE: Multiplanar, multisequence MR imaging of the lumbar spine was performed. No intravenous contrast was administered. COMPARISON:  None. FINDINGS: Segmentation: Standard. Lowest well-formed disc space labeled the L5-S1 level. Alignment: Physiologic with preservation of the normal lumbar lordosis. No listhesis. Vertebrae: Abnormal T2/STIR signal intensity seen throughout the L4-5 interspace with associated progressive disc space height loss as compared to previous. Associated abnormal marrow edema with mild endplate irregularity within the adjacent L4 and L5 vertebral bodies. Findings consistent with acute osteomyelitis discitis. Irregular heterogeneous T1/T2 signal intensity seen within the ventral epidural space at this level could reflect postoperative changes and/or epidural phlegmon/abscess (series 2, image 8). Mild reactive marrow edema with small joint effusion seen about the left greater than right L4-5 facets as well, which could also be related to infection and/or arthritic changes. No other evidence for acute infection within the lumbar spine. No evidence for acute or chronic fracture. Underlying bone marrow signal intensity mildly heterogeneous but within normal limits. No worrisome osseous lesions. Conus medullaris and cauda equina: Conus extends to the L1 level. Conus and cauda equina appear normal. Paraspinal and other soft tissues: Soft tissue edema seen throughout the lower posterior paraspinous soft tissues, which could be related to postoperative changes and/or infection/myositis. Abnormal edema seen within the psoas musculature bilaterally as well, likely related to infection. Few small superimposed soft tissue collections present within both psoas muscles,  largest of which measures approximately 11 mm on the right (series 5, image 29), consistent with small abscesses. Few small cyst noted within the kidneys bilaterally. Visualized visceral structures otherwise unremarkable. Disc levels: L1-2: Negative interspace. Mild  facet hypertrophy. No canal or foraminal stenosis. L2-3: Mild diffuse disc bulge with disc desiccation. Disc bulge asymmetric to the right with associated mild reactive endplate changes. Moderate facet and ligament flavum hypertrophy. Resultant mild spinal stenosis. Foramina remain patent. L3-4: Mild disc bulge with disc desiccation. Superimposed broad right extraforaminal disc protrusion (series 5, image 19). Moderate facet and ligament flavum hypertrophy. Resultant moderate canal with bilateral subarticular stenosis. Mild bilateral L3 foraminal narrowing. L4-5: Findings concerning for osteomyelitis discitis as above. Sequelae of recent posterior decompression. Residual left greater than right facet degeneration. Persistent moderate spinal stenosis, improved as compared to preoperative exam. Moderate right with severe left L4 foraminal narrowing. L5-S1: Diffuse disc bulge with disc desiccation and mild intervertebral disc space narrowing. Mild reactive endplate changes. Moderate facet and ligament flavum hypertrophy. Resultant moderate bilateral subarticular stenosis. Moderate right worse than left L5 foraminal narrowing. IMPRESSION: 1. Findings consistent with acute osteomyelitis discitis at L4-5. Heterogeneous signal intensity within the adjacent ventral epidural space could reflect epidural abscess and/or phlegmon or possibly disc material. Sequelae of recent posterior decompression at this level with persistent moderate spinal stenosis. 2. Evidence for associated soft tissue infection/cellulitis within the psoas musculature bilaterally with a few small superimposed abscesses as above. Edema throughout the posterior paraspinous musculature could  reflect postoperative changes and/or myositis/infection. 3. Additional multilevel degenerative spondylosis as above, most pronounced at L3-4 where there is resultant moderate spinal stenosis, otherwise stable. Electronically Signed   By: Jeannine Boga M.D.   On: 04/02/2020 18:56    Scheduled Meds:  sodium chloride   Intravenous Once   Chlorhexidine Gluconate Cloth  6 each Topical Daily   docusate sodium  100 mg Oral BID   feeding supplement (ENSURE ENLIVE)  237 mL Oral TID BM   feeding supplement (PRO-STAT SUGAR FREE 64)  30 mL Oral BID   insulin aspart  0-15 Units Subcutaneous TID WC   insulin aspart  0-5 Units Subcutaneous QHS   insulin aspart  3 Units Subcutaneous TID WC   insulin glargine  10 Units Subcutaneous BID   iron polysaccharides  150 mg Oral Q breakfast   levothyroxine  25 mcg Oral Q0600   midodrine  10 mg Oral TID WC   senna  1 tablet Oral BID   sodium chloride flush  10-40 mL Intracatheter Q12H   Continuous Infusions:  sodium chloride 250 mL (04/02/20 2230)   ampicillin (OMNIPEN) IV 2 g (04/04/20 1040)     LOS: 15 days   Alma Friendly, MD [facility] Pager On Amion  If 7PM-7AM, please contact night-coverage 04/04/2020, 2:45 PM

## 2020-04-04 NOTE — Progress Notes (Signed)
[facility]   Reason for visit: Follow up on Enterococcal bacteremia and lumbar infection  Interval History: MRI recently with new finding of Lumbar osteomyelitis/discitis and some possible inflammatory area with concern for epidural abscess. Some soft tissue infection in the psoas area.     Physical Exam: Constitutional:  Vitals:   04/04/20 0430 04/04/20 0800  BP: 126/68 123/76  Pulse: 82 94  Resp: 18   Temp: 98.9 F (37.2 C)   SpO2: 98% 99%   patient appears in NAD Respiratory: Normal respiratory effort; CTA B Cardiovascular: RRR + murmur GI: soft, nt, nd  Review of Systems: Constitutional: negative for fevers and chills Gastrointestinal: negative for nausea and diarrhea  Lab Results  Component Value Date   WBC 10.6 (H) 04/04/2020   HGB 7.8 (L) 04/04/2020   HCT 24.5 (L) 04/04/2020   MCV 93.5 04/04/2020   PLT 319 04/04/2020    Lab Results  Component Value Date   CREATININE 1.43 (H) 04/04/2020   BUN 18 04/04/2020   NA 135 04/04/2020   K 3.9 04/04/2020   CL 101 04/04/2020   CO2 27 04/04/2020    Lab Results  Component Value Date   ALT 118 (H) 04/01/2020   AST 99 (H) 04/01/2020   ALKPHOS 79 04/01/2020     Microbiology: Recent Results (from the past 240 hour(s))  Culture, blood (Routine X 2) w Reflex to ID Panel     Status: None   Collection Time: 03/26/20  9:15 PM   Specimen: BLOOD  Result Value Ref Range Status   Specimen Description BLOOD RIGHT ARM  Final   Special Requests   Final    BOTTLES DRAWN AEROBIC AND ANAEROBIC Blood Culture results may not be optimal due to an excessive volume of blood received in culture bottles   Culture   Final    NO GROWTH 5 DAYS Performed at [facility], [place] [address] [place], [place] [address]    Report Status 03/31/2020 FINAL  Final  Culture, blood (Routine X 2) w Reflex to ID Panel     Status: None   Collection Time: 03/26/20  9:25 PM   Specimen: BLOOD  Result Value Ref Range  Status   Specimen Description BLOOD LEFT FOREARM  Final   Special Requests   Final    BOTTLES DRAWN AEROBIC AND ANAEROBIC Blood Culture results may not be optimal due to an excessive volume of blood received in culture bottles   Culture   Final    NO GROWTH 5 DAYS Performed at [facility], [place] [address], [place], [place] [address]    Report Status 03/31/2020 FINAL  Final    Impression/Plan:  1. Lumbar discitis/osteomyelitis - for disc aspiration by IR per Dr. Trenton Gammon on Monday.  Will monitor results of this.  I am normally in agreement with the recommendation by Dr. Karenann Cai to hold antibiotics to improve the yield, however in this case with the bacteremia and a prosthetic valve currently uninfected, I favor continuing with the antibiotics due to the prosthetic valve to avoid any complications.  Therefore I will continue with the ampicillin.   Duration will be lengthened to another 5-6 weeks from now for 7-8 total from the negative blood cultures through June 30th.  Will check ESR and CRP for baseline  2.  Bacteremia - Enterococus in knee aspirate and  Blood cultures, last blood cultures 5/6 remained negative.   Continue with ampicillin  3.  Septic arthritis - Enterococcal and treatment as above.   4.  Disposition - will extend duration as above and from an ID standpoint, ok to be transferred to rehab before final cultures (after aspiration) and cultures can be monitored and antibiotics changed if needed later.   Dr. Baxter Flattery will follow up on Monday

## 2020-04-05 LAB — CBC WITH DIFFERENTIAL/PLATELET
Abs Immature Granulocytes: 0.06 10*3/uL (ref 0.00–0.07)
Basophils Absolute: 0.1 10*3/uL (ref 0.0–0.1)
Basophils Relative: 1 %
Eosinophils Absolute: 0.1 10*3/uL (ref 0.0–0.5)
Eosinophils Relative: 2 %
HCT: 23.2 % — ABNORMAL LOW (ref 39.0–52.0)
Hemoglobin: 7.3 g/dL — ABNORMAL LOW (ref 13.0–17.0)
Immature Granulocytes: 1 %
Lymphocytes Relative: 13 %
Lymphs Abs: 1.1 10*3/uL (ref 0.7–4.0)
MCH: 29.6 pg (ref 26.0–34.0)
MCHC: 31.5 g/dL (ref 30.0–36.0)
MCV: 93.9 fL (ref 80.0–100.0)
Monocytes Absolute: 0.5 10*3/uL (ref 0.1–1.0)
Monocytes Relative: 5 %
Neutro Abs: 6.7 10*3/uL (ref 1.7–7.7)
Neutrophils Relative %: 78 %
Platelets: 321 10*3/uL (ref 150–400)
RBC: 2.47 MIL/uL — ABNORMAL LOW (ref 4.22–5.81)
RDW: 15.4 % (ref 11.5–15.5)
WBC: 8.5 10*3/uL (ref 4.0–10.5)
nRBC: 0 % (ref 0.0–0.2)

## 2020-04-05 LAB — SEDIMENTATION RATE: Sed Rate: 118 mm/hr — ABNORMAL HIGH (ref 0–16)

## 2020-04-05 LAB — GLUCOSE, CAPILLARY
Glucose-Capillary: 129 mg/dL — ABNORMAL HIGH (ref 70–99)
Glucose-Capillary: 206 mg/dL — ABNORMAL HIGH (ref 70–99)
Glucose-Capillary: 209 mg/dL — ABNORMAL HIGH (ref 70–99)
Glucose-Capillary: 91 mg/dL (ref 70–99)

## 2020-04-05 LAB — BASIC METABOLIC PANEL
Anion gap: 9 (ref 5–15)
BUN: 18 mg/dL (ref 8–23)
CO2: 28 mmol/L (ref 22–32)
Calcium: 8.8 mg/dL — ABNORMAL LOW (ref 8.9–10.3)
Chloride: 99 mmol/L (ref 98–111)
Creatinine, Ser: 1.27 mg/dL — ABNORMAL HIGH (ref 0.61–1.24)
GFR calc Af Amer: >60 mL/min (ref >60)
GFR calc non Af Amer: 58 mL/min — ABNORMAL LOW (ref >60)
Glucose, Bld: 136 mg/dL — ABNORMAL HIGH (ref 70–99)
Potassium: 3.8 mmol/L (ref 3.5–5.1)
Sodium: 136 mmol/L (ref 135–145)

## 2020-04-05 LAB — C-REACTIVE PROTEIN: CRP: 12.4 mg/dL — ABNORMAL HIGH (ref <1.0)

## 2020-04-05 MED ORDER — TRAZODONE HCL 50 MG PO TABS
50.0000 mg | ORAL_TABLET | Freq: Every evening | ORAL | Status: DC | PRN
  Administered 2020-04-06: 50 mg via ORAL
  Filled 2020-04-05: qty 1

## 2020-04-05 MED ORDER — SODIUM CHLORIDE 0.9 % IV BOLUS
500.0000 mL | Freq: Once | INTRAVENOUS | Status: AC
  Administered 2020-04-05: 500 mL via INTRAVENOUS

## 2020-04-05 NOTE — Consult Note (Signed)
Chief Complaint: Patient was seen in consultation today for enterococcal bacteremia  Referring Physician(s): Dr. Horris Latino  Supervising Physician: Pedro Earls  Patient Status: [facility] - In-pt  History of Present Illness: Johnathan Murray is a 69 y.o. male withhistory of paroxysmal atrial fibrillation, hypertension, bioprosthetic aortic valve replacement who had undergone recent cervical discectomy and fusion about 3 weeks ago done by Dr. Trenton Gammon.  Admitted now with enterococcal bacteremia, sepsis now s/p 2 weeks of IV antibiotics. Patient was progressing well until the end of last week when he developed new back pain.   MR Lumbar Spine 04/02/20 showed: 1. Findings consistent with acute osteomyelitis discitis at L4-5. Heterogeneous signal intensity within the adjacent ventral epidural space could reflect epidural abscess and/or phlegmon or possibly disc material. Sequelae of recent posterior decompression at this level with persistent moderate spinal stenosis. 2. Evidence for associated soft tissue infection/cellulitis within the psoas musculature bilaterally with a few small superimposed abscesses as above. Edema throughout the posterior paraspinous musculature could reflect postoperative changes and/or myositis/infection. 3. Additional multilevel degenerative spondylosis as above, most pronounced at L3-4 where there is resultant moderate spinal stenosis, otherwise stable.  IR consulted for disc aspiration at the request of Neurosurgery.  Case reviewed and approved by Dr. Karenann Cai. Afebrile.  WBC 8.5  Past Medical History:  Diagnosis Date  . Abnormal LFTs (liver function tests) 10/31/2016  . Abnormal nuclear stress test 05/03/2018  . Acid reflux 02/15/2018   not at present  . Acute otitis externa of left ear 11/13/2019  . Acute suppurative otitis media of left ear without spontaneous rupture of tympanic membrane 11/13/2019  . Anemia    low iron    . Aortic stenosis 05/03/2018  . Arthritis 02/15/2018   Overview:  shoulder  . Cancer ([place])    skin cancer on left wrist  . Cervical myelopathy ([place]) 04/22/2019  . Conductive hearing loss of left ear 11/13/2019  . Coronary artery disease involving native coronary artery of native heart without angina pectoris 05/30/2016  . Coronary artery disease of native artery of native heart with stable angina pectoris ([place])    pt. denies  . Diabetes mellitus due to underlying condition with unspecified complications ([place]) [id number]  . Dyslipidemia 05/30/2016  . Dyspnea    upon exertion,  as of 06/05/09 not having any recent chest pain  . Dysrhythmia    a fib  . Essential hypertension 05/30/2016  . Heart murmur    Has had an Aortic valve replacement  . Hyperactive gag reflex 02/15/2018  . Hypothyroidism   . Intermittent claudication ([place]) 10/31/2018  . Lumbar stenosis with neurogenic claudication 06/11/2019  . Macular degeneration 02/15/2018  . PAF (paroxysmal atrial fibrillation) ([place]) 05/30/2016  . Pre-operative cardiovascular examination 01/03/2020  . Preoperative cardiovascular examination 04/17/2019  . Pseudogout of left knee 03/21/2020  . Renal insufficiency 01/17/2020  . S/P aortic valve replacement 01/17/2020  . Tinnitus of left ear 11/13/2019    Past Surgical History:  Procedure Laterality Date  . ANTERIOR CERVICAL DECOMP/DISCECTOMY FUSION N/A 04/22/2019   Procedure: Anterior Cervical Discectomy and Fusion, Cervical four-five, Cervical five-six;  Surgeon: Earnie Larsson, MD;  Location: [place];  Service: Neurosurgery;  Laterality: N/A;  . ANTERIOR CERVICAL DECOMP/DISCECTOMY FUSION N/A 03/02/2020   Procedure: Anterior Cervical Decompression Fusion  - Cervical three-Cervical four;  Surgeon: Earnie Larsson, MD;  Location: [place];  Service: Neurosurgery;  Laterality: N/A;  . AORTIC VALVE REPLACEMENT    . CARDIAC CATHETERIZATION  several stents  . CARDIAC SURGERY    . CORONARY ARTERY BYPASS GRAFT  2017    patient unsure how many, had at [facility]  . HAND SURGERY    . IRRIGATION AND DEBRIDEMENT KNEE Bilateral 03/24/2020   Procedure: ARTHROSCOPY IRRIGATION AND DEBRIDEMENT BILATERAL  KNEE;  Surgeon: Marchia Bond, MD;  Location: [place];  Service: Orthopedics;  Laterality: Bilateral;  . KNEE SURGERY    . LEFT HEART CATH AND CORONARY ANGIOGRAPHY N/A 01/21/2020   Procedure: LEFT HEART CATH AND CORONARY ANGIOGRAPHY;  Surgeon: Belva Crome, MD;  Location: [place] [facility];  Service: Cardiovascular;  Laterality: N/A;  . LEFT HEART CATH AND CORS/GRAFTS ANGIOGRAPHY N/A 05/16/2018   Procedure: LEFT HEART CATH AND CORS/GRAFTS ANGIOGRAPHY;  Surgeon: Burnell Blanks, MD;  Location: [place] [facility];  Service: Cardiovascular;  Laterality: N/A;  . LUMBAR LAMINECTOMY/DECOMPRESSION MICRODISCECTOMY Bilateral 06/11/2019   Procedure: Bilateral Lumbar Four-FiveLaminectomy/Foraminotomy;  Surgeon: Earnie Larsson, MD;  Location: [place];  Service: Neurosurgery;  Laterality: Bilateral;  posterior  . TEE WITHOUT CARDIOVERSION N/A 03/26/2020   Procedure: TRANSESOPHAGEAL ECHOCARDIOGRAM (TEE);  Surgeon: Jerline Pain, MD;  Location: [facility] END[facility];  Service: Cardiovascular;  Laterality: N/A;    Allergies: Atorvastatin and Nebivolol  Medications: Prior to Admission medications   Medication Sig Start Date End Date Taking? Authorizing Provider  amiodarone (PACERONE) 400 MG tablet Take 1 tablet (400 mg total) by mouth daily. Take 400 mg for 2 weeks then decrease to 200 mg daily. Patient taking differently: Take 200 mg by mouth at bedtime.  02/04/20  Yes Revankar, Reita Cliche, MD  amLODipine (NORVASC) 10 MG tablet Take 10 mg by mouth at bedtime.  04/14/17  Yes [provider]  apixaban (ELIQUIS) 5 MG TABS tablet Take 1 tablet (5 mg total) by mouth 2 (two) times daily. 02/04/20  Yes Revankar, Reita Cliche, MD  benazepril (LOTENSIN) 40 MG tablet Take 40 mg by mouth at bedtime.  09/23/17  Yes [provider]    Coenzyme Q10 (COQ10) 200 MG CAPS Take 200 mg by mouth daily.    Yes [provider]  ezetimibe (ZETIA) 10 MG tablet Take 1 tablet (10 mg total) by mouth at bedtime. 02/04/20  Yes Revankar, Reita Cliche, MD  furosemide (LASIX) 40 MG tablet Take 40 mg by mouth daily. 10/10/17  Yes [provider]  levothyroxine (SYNTHROID) 25 MCG tablet Take 25 mcg by mouth daily before breakfast.  01/23/20  Yes [provider]  metFORMIN (GLUCOPHAGE) 500 MG tablet Take 500 mg by mouth at bedtime.  12/28/18  Yes [provider]  nitroGLYCERIN (NITROSTAT) 0.4 MG SL tablet Place 1 tablet (0.4 mg total) under the tongue every 5 (five) minutes as needed for chest pain. 01/13/20  Yes Revankar, Reita Cliche, MD  vitamin B-12 (CYANOCOBALAMIN) 1000 MCG tablet Take 1,000 mcg by mouth daily.   Yes [provider]  vitamin C (ASCORBIC ACID) 500 MG tablet Take 500 mg by mouth daily.    Yes [provider]  zinc gluconate 50 MG tablet Take 50 mg by mouth daily.   Yes [provider]  cyclobenzaprine (FLEXERIL) 10 MG tablet Take 1 tablet (10 mg total) by mouth at bedtime. Patient not taking: Reported on 01/20/2020 03/06/19   Narda Amber K, DO  metoprolol tartrate (LOPRESSOR) 50 MG tablet TAKE 1 TABLET (50 MG TOTAL) BY MOUTH DAILY AS NEEDED. 03/26/20 06/24/20  Revankar, Reita Cliche, MD     Family History  Problem Relation Age  of Onset  . Prostate cancer Brother   . Heart disease Maternal Grandmother   . Brain cancer Mother     Social History   Socioeconomic History  . Marital status: Married    Spouse name: [name]  . Number of children: 1  . Years of education: Not on file  . Highest education level: GED or equivalent  Occupational History    Employer: [employer]  Tobacco Use  . Smoking status: Never Smoker  . Smokeless tobacco: Never Used  Substance and Sexual Activity  . Alcohol use: Not Currently  . Drug use: No  . Sexual activity: Not on file  Other Topics  Concern  . Not on file  Social History Narrative   He drives a truck for [employer].   He lives with wife in a one-level home.   Highest level of education:  GED      Patient is right-handed. There are 8 steps to enter his home.   Social Determinants of Health   Financial Resource Strain:   . Difficulty of Paying Living Expenses:   Food Insecurity:   . Worried About Charity fundraiser in the Last Year:   . Arboriculturist in the Last Year:   Transportation Needs:   . Film/video editor (Medical):   Marland Kitchen Lack of Transportation (Non-Medical):   Physical Activity:   . Days of Exercise per Week:   . Minutes of Exercise per Session:   Stress:   . Feeling of Stress :   Social Connections:   . Frequency of Communication with Friends and Family:   . Frequency of Social Gatherings with Friends and Family:   . Attends Religious Services:   . Active Member of Clubs or Organizations:   . Attends Archivist Meetings:   Marland Kitchen Marital Status:      Review of Systems: A 12 point ROS discussed and pertinent positives are indicated in the HPI above.  All other systems are negative.  Review of Systems  Constitutional: Negative for fatigue and fever.  Respiratory: Negative for cough and shortness of breath.   Cardiovascular: Negative for chest pain.  Gastrointestinal: Positive for constipation. Negative for abdominal pain, nausea and vomiting.  Musculoskeletal: Positive for back pain.  Psychiatric/Behavioral: Negative for behavioral problems and confusion.    Vital Signs: BP 123/70   Pulse 93   Temp 98.2 F (36.8 C)   Resp 16   Ht 5\' 10"  (1.778 m)   Wt 180 lb 9.1 oz (81.9 kg)   SpO2 99%   BMI 25.91 kg/m   Physical Exam Vitals and nursing note reviewed.  Constitutional:      General: He is not in acute distress.    Appearance: He is not ill-appearing.  HENT:     Mouth/Throat:     Pharynx: Oropharynx is clear.  Cardiovascular:     Rate and Rhythm: Normal rate  and regular rhythm.  Pulmonary:     Effort: Pulmonary effort is normal.     Breath sounds: Normal breath sounds.  Skin:    General: Skin is warm and dry.  Neurological:     General: No focal deficit present.     Mental Status: He is alert and oriented to person, place, and time. Mental status is at baseline.  Psychiatric:        Mood and Affect: Mood normal.        Behavior: Behavior normal.        Thought Content:  Thought content normal.        Judgment: Judgment normal.      MD Evaluation Airway: WNL Heart: WNL Heart  comments: irreg Abdomen: WNL Chest/ Lungs: WNL ASA  Classification: 3 Mallampati/Airway Score: Two   Imaging: CT ABDOMEN PELVIS WO CONTRAST  Result Date: 03/19/2020 CLINICAL DATA:  Generalized abdominal pain EXAM: CT ABDOMEN AND PELVIS WITHOUT CONTRAST TECHNIQUE: Multidetector CT imaging of the abdomen and pelvis was performed following the standard protocol without IV contrast. COMPARISON:  None. FINDINGS: Lower chest: Prosthetic aortic valve is seen. No pericardial fluid/thickening. No hiatal hernia. The visualized portions of the lungs are clear. Hepatobiliary: Although limited due to the lack of intravenous contrast, normal in appearance without gross focal abnormality. Scattered punctate calcifications are seen. No evidence of calcified gallstones or biliary ductal dilatation. Pancreas:  Unremarkable.  No surrounding inflammatory changes. Spleen: Normal in size. Although limited due to the lack of intravenous contrast, normal in appearance. Adrenals/Urinary Tract: Both adrenal glands appear normal. Mild bilateral perinephric stranding is seen, likely due to chronic medical renal disease. No renal or collecting system calculi. No hydronephrosis. Bladder is unremarkable. Stomach/Bowel: The stomach and small bowel are normal in appearance. There is a moderate amount of colonic stool. Scattered colonic diverticula are noted without diverticulitis. Vascular/Lymphatic:  There are no enlarged abdominal or pelvic lymph nodes. Scattered aortic atherosclerotic calcifications are seen without aneurysmal dilatation. Reproductive: The prostate is unremarkable. Other: No evidence of abdominal wall mass or hernia. Musculoskeletal: No acute or significant osseous findings. IMPRESSION: No renal or collecting system calculi. Colonic diverticulosis without diverticulitis. Moderate amount of colonic stool. Aortic Atherosclerosis (ICD10-I70.0). No other acute intra-abdominal or pelvic pathology to explain the patient's symptoms. Electronically Signed   By: Prudencio Pair M.D.   On: 03/19/2020 23:14   CT Cervical Spine Wo Contrast  Result Date: 03/19/2020 CLINICAL DATA:  Recent cervical surgery with neck pain EXAM: CT CERVICAL SPINE WITHOUT CONTRAST TECHNIQUE: Multidetector CT imaging of the cervical spine was performed without intravenous contrast. Multiplanar CT image reconstructions were also generated. COMPARISON:  CT 12/27/2019 FINDINGS: Alignment: Mild straightening. No subluxation. Facet alignment is maintained. Skull base and vertebrae: No acute fracture. No primary bone lesion or focal pathologic process. Soft tissues and spinal canal: No prevertebral fluid or swelling. No visible canal hematoma. Disc levels: Interval removal of fixating plate and screws from C4 through C6 with hardware tracks present in C5 and C6 vertebral bodies. Intervertebral disc devices are present at C4-C5 and C5-C6. Interval placement of anterior plate and fixating screws in addition to interbody device at C3-C4. There appears to be resection of the posteroinferior aspect of C3 and superior posterior aspect of C4. Cut margins appear smooth. Degenerative changes at C6-C7 and C7-T1. Upper chest: Carotid vascular calcifications. Lung apices are clear. Other: No gross retropharyngeal gas or fluid collection. IMPRESSION: 1. Interval removal of fixating plate and screws from C4 through C6 with interval placement of  surgical plate, fixating screws and interbody device at C3-C4. No acute osseous abnormality. Electronically Signed   By: Donavan Foil M.D.   On: 03/19/2020 23:19   MR CERVICAL SPINE WO CONTRAST  Result Date: 03/23/2020 CLINICAL DATA:  Enterococcal bacteremia and possible aortic valve endocarditis. History of prior cervical spine surgery, most recently 03/02/2020 when the patient underwent C3-4 ACDF. Severe neck pain. EXAM: MRI CERVICAL SPINE WITHOUT CONTRAST TECHNIQUE: Multiplanar, multisequence MR imaging of the cervical spine was performed. No intravenous contrast was administered. COMPARISON:  Cervical spine CT scan 03/19/2020. MRI cervical  spine 10/28/2017. Postmyelogram CT scan 12/27/2019. FINDINGS: Alignment: Maintained. Vertebrae: Again seen is postoperative change of C3-6 fusion. Hardware at C4-5 and C5-6 has been removed. Anterior plate and screws are in place at C3-4. No evidence of discitis or osteomyelitis. No fracture or worrisome lesion. Cord: Patient motion somewhat limits evaluation. There is a punctate focus of signal abnormality in the left hemicord at C4-5 compatible with myelomalacia. There may be mild signal abnormality within the cord at C3-4. No epidural abscess. Posterior Fossa, vertebral arteries, paraspinal tissues: Negative. Disc levels: C2-3: Shallow disc osteophyte complex and right worse than left facet arthropathy. There is some ligamentum flavum thickening. Mild central canal and mild to moderate right foraminal stenosis. Left foramen open. No change. C3-4: Status post discectomy and fusion since the prior postmyelogram CT. There is severe facet degenerative disease on the left with marrow edema in the facets. Cord flattening seen on the prior examination appears resolved. The foramina appear open. Motion degrades evaluation. C4-5: Status post discectomy and fusion.  No stenosis. C5-6: Status post discectomy and fusion.  No stenosis. C6-7: Minimal bulge without stenosis. C7-T1:  Shallow disc osteophyte complex and uncovertebral disease. No stenosis. T1-2: Disc bulge without stenosis. This level is imaged in the sagittal plane only. IMPRESSION: Negative for evidence of infection. Status post C3-4 discectomy and fusion. Central canal stenosis seen on the preoperative examination is improved. Marked facet degenerative change on the left with associated marrow edema noted. Although motion somewhat limits evaluation, there appears to be a small focus of myelomalacia in the left cord at C4-5. Electronically Signed   By: Inge Rise M.D.   On: 03/23/2020 11:59   MR LUMBAR SPINE WO CONTRAST  Result Date: 04/02/2020 CLINICAL DATA:  For comparison initial evaluation for acute low back pain, recent surgery, infection suspected. EXAM: MRI LUMBAR SPINE WITHOUT CONTRAST TECHNIQUE: Multiplanar, multisequence MR imaging of the lumbar spine was performed. No intravenous contrast was administered. COMPARISON:  None. FINDINGS: Segmentation: Standard. Lowest well-formed disc space labeled the L5-S1 level. Alignment: Physiologic with preservation of the normal lumbar lordosis. No listhesis. Vertebrae: Abnormal T2/STIR signal intensity seen throughout the L4-5 interspace with associated progressive disc space height loss as compared to previous. Associated abnormal marrow edema with mild endplate irregularity within the adjacent L4 and L5 vertebral bodies. Findings consistent with acute osteomyelitis discitis. Irregular heterogeneous T1/T2 signal intensity seen within the ventral epidural space at this level could reflect postoperative changes and/or epidural phlegmon/abscess (series 2, image 8). Mild reactive marrow edema with small joint effusion seen about the left greater than right L4-5 facets as well, which could also be related to infection and/or arthritic changes. No other evidence for acute infection within the lumbar spine. No evidence for acute or chronic fracture. Underlying bone marrow  signal intensity mildly heterogeneous but within normal limits. No worrisome osseous lesions. Conus medullaris and cauda equina: Conus extends to the L1 level. Conus and cauda equina appear normal. Paraspinal and other soft tissues: Soft tissue edema seen throughout the lower posterior paraspinous soft tissues, which could be related to postoperative changes and/or infection/myositis. Abnormal edema seen within the psoas musculature bilaterally as well, likely related to infection. Few small superimposed soft tissue collections present within both psoas muscles, largest of which measures approximately 11 mm on the right (series 5, image 29), consistent with small abscesses. Few small cyst noted within the kidneys bilaterally. Visualized visceral structures otherwise unremarkable. Disc levels: L1-2: Negative interspace. Mild facet hypertrophy. No canal or foraminal stenosis. L2-3: Mild  diffuse disc bulge with disc desiccation. Disc bulge asymmetric to the right with associated mild reactive endplate changes. Moderate facet and ligament flavum hypertrophy. Resultant mild spinal stenosis. Foramina remain patent. L3-4: Mild disc bulge with disc desiccation. Superimposed broad right extraforaminal disc protrusion (series 5, image 19). Moderate facet and ligament flavum hypertrophy. Resultant moderate canal with bilateral subarticular stenosis. Mild bilateral L3 foraminal narrowing. L4-5: Findings concerning for osteomyelitis discitis as above. Sequelae of recent posterior decompression. Residual left greater than right facet degeneration. Persistent moderate spinal stenosis, improved as compared to preoperative exam. Moderate right with severe left L4 foraminal narrowing. L5-S1: Diffuse disc bulge with disc desiccation and mild intervertebral disc space narrowing. Mild reactive endplate changes. Moderate facet and ligament flavum hypertrophy. Resultant moderate bilateral subarticular stenosis. Moderate right worse than  left L5 foraminal narrowing. IMPRESSION: 1. Findings consistent with acute osteomyelitis discitis at L4-5. Heterogeneous signal intensity within the adjacent ventral epidural space could reflect epidural abscess and/or phlegmon or possibly disc material. Sequelae of recent posterior decompression at this level with persistent moderate spinal stenosis. 2. Evidence for associated soft tissue infection/cellulitis within the psoas musculature bilaterally with a few small superimposed abscesses as above. Edema throughout the posterior paraspinous musculature could reflect postoperative changes and/or myositis/infection. 3. Additional multilevel degenerative spondylosis as above, most pronounced at L3-4 where there is resultant moderate spinal stenosis, otherwise stable. Electronically Signed   By: Jeannine Boga M.D.   On: 04/02/2020 18:56   US Abdomen Limited  Result Date: 03/20/2020 CLINICAL DATA:  Right upper quadrant pain EXAM: ULTRASOUND ABDOMEN LIMITED RIGHT UPPER QUADRANT COMPARISON:  None. FINDINGS: Gallbladder: Partially decompressed gallbladder. No gallstones or wall thickening visualized. No sonographic Murphy sign noted by sonographer. Common bile duct: Diameter: 6 mm Liver: Increased echotexture seen throughout. No focal abnormality or biliary ductal dilatation. Portal vein is patent on color Doppler imaging with normal direction of blood flow towards the liver. Other: None. IMPRESSION: Hepatic steatosis. Partially decompressed gallbladder otherwise unremarkable. Electronically Signed   By: Prudencio Pair M.D.   On: 03/20/2020 00:37   DG Chest Port 1 View  Result Date: 03/20/2020 CLINICAL DATA:  Acute kidney injury EXAM: PORTABLE CHEST 1 VIEW COMPARISON:  05/03/2018 chest radiograph. FINDINGS: Intact sternotomy wires. Cardiac valvular prosthesis and CABG clips noted. Stable cardiomediastinal silhouette with top-normal heart size. No pneumothorax. No pleural effusion. Lungs appear clear, with no  acute consolidative airspace disease and no pulmonary edema. IMPRESSION: No active disease. Electronically Signed   By: Ilona Sorrel M.D.   On: 03/20/2020 12:29   DG Knee Left Port  Result Date: 03/21/2020 CLINICAL DATA:  LEFT knee pain and swelling, question septic knee EXAM: PORTABLE LEFT KNEE - 1-2 VIEW COMPARISON:  Portable exam 1146 hours without priors for comparison FINDINGS: Osseous demineralization. Scattered joint space narrowing and chondrocalcinosis. Lateral subluxation of tibia. Large joint effusion. Anterior soft tissue swelling. No acute fracture, dislocation or bone destruction. IMPRESSION: Osseous demineralization with diffuse degenerative changes and question CPPD LEFT knee. Large joint effusion and mild anterior soft tissue swelling without definite acute osseous findings. Septic joint cannot be excluded radiographically; if this is a clinical concern, recommend joint aspiration. Electronically Signed   By: Lavonia Dana M.D.   On: 03/21/2020 13:20   ECHOCARDIOGRAM COMPLETE  Result Date: 03/22/2020    ECHOCARDIOGRAM REPORT   Patient Name:   KAIDIN RUSHLOW Bubolz Date of Exam: 03/22/2020 Medical Rec #:  [id number]           Height:  70.0 in Accession #:    [id number]          Weight:       180.6 lb Date of Birth:  March 10, 1951           BSA:          1.999 m Patient Age:    50 years            BP:           117/57 mmHg Patient Gender: M                   HR:           86 bpm. Exam Location:  Inpatient Procedure: 2D Echo and Intracardiac Opacification Agent Indications:    Bacteremia 790.7 / R78.81  History:        Patient has prior history of Echocardiogram examinations, most                 recent 01/16/2020. CAD, 23 mm Edwards tissue valve,                 Arrythmias:Atrial Fibrillation; Risk Factors:Hypertension,                 Diabetes and Dyslipidemia.                 Aortic Valve: 23 mm Edwards-SAPIEN valve is present in the                 aortic position.  Sonographer:    Vikki Ports  [place] Referring Phys: [place]  1. There is no obvious vegetation, however transaortic gradients across the bioprosthetic valve are increased from the prior study on 01/16/2020, mean gradient 28 mmHg, previously 16 mmHg. Consider TEE for further evaluation.  2. Left ventricular ejection fraction, by estimation, is 55 to 60%. The left ventricle has normal function. The left ventricle has no regional wall motion abnormalities. There is mild concentric left ventricular hypertrophy. Left ventricular diastolic parameters are consistent with Grade I diastolic dysfunction (impaired relaxation). Elevated left atrial pressure.  3. Right ventricular systolic function is normal. The right ventricular size is normal.  4. Left atrial size was mildly dilated.  5. The mitral valve is normal in structure. Mild mitral valve regurgitation. No evidence of mitral stenosis.  6. The aortic valve has been repaired/replaced. Aortic valve regurgitation is not visualized. No aortic stenosis is present. There is a 23 mm Edwards-SAPIEN valve present in the aortic position. Aortic valve mean gradient measures 28.0 mmHg.  7. The inferior vena cava is normal in size with greater than 50% respiratory variability, suggesting right atrial pressure of 3 mmHg. FINDINGS  Left Ventricle: Left ventricular ejection fraction, by estimation, is 55 to 60%. The left ventricle has normal function. The left ventricle has no regional wall motion abnormalities. Definity contrast agent was given IV to delineate the left ventricular  endocardial borders. The left ventricular internal cavity size was normal in size. There is mild concentric left ventricular hypertrophy. Abnormal (paradoxical) septal motion consistent with post-operative status and abnormal (paradoxical) septal motion, consistent with left bundle branch block. Left ventricular diastolic parameters are consistent with Grade I diastolic dysfunction (impaired relaxation).  Elevated left atrial pressure. Right Ventricle: The right ventricular size is normal. No increase in right ventricular wall thickness. Right ventricular systolic function is normal. Left Atrium: Left atrial size was mildly dilated. Right Atrium: Right atrial size was normal in size. Pericardium: There  is no evidence of pericardial effusion. Mitral Valve: The mitral valve is normal in structure. Normal mobility of the mitral valve leaflets. Mild mitral valve regurgitation. No evidence of mitral valve stenosis. Tricuspid Valve: The tricuspid valve is normal in structure. Tricuspid valve regurgitation is mild . No evidence of tricuspid stenosis. Aortic Valve: The aortic valve has been repaired/replaced. Aortic valve regurgitation is not visualized. No aortic stenosis is present. Aortic valve mean gradient measures 28.0 mmHg. Aortic valve peak gradient measures 45.1 mmHg. Aortic valve area, by VTI measures 1.31 cm. There is a 23 mm Edwards-SAPIEN valve present in the aortic position. Pulmonic Valve: The pulmonic valve was normal in structure. Pulmonic valve regurgitation is not visualized. No evidence of pulmonic stenosis. Aorta: The aortic root is normal in size and structure. Venous: The inferior vena cava is normal in size with greater than 50% respiratory variability, suggesting right atrial pressure of 3 mmHg. IAS/Shunts: No atrial level shunt detected by color flow Doppler.  LEFT VENTRICLE PLAX 2D LVIDd:         4.40 cm  Diastology LVIDs:         3.10 cm  LV e' lateral:   14.00 cm/s LV PW:         1.10 cm  LV E/e' lateral: 8.1 LV IVS:        1.00 cm  LV e' medial:    4.88 cm/s LVOT diam:     1.80 cm  LV E/e' medial:  23.4 LV SV:         70 LV SV Index:   35 LVOT Area:     2.54 cm  LEFT ATRIUM           Index       RIGHT ATRIUM           Index LA diam:      4.00 cm 2.00 cm/m  RA Area:     19.80 cm LA Vol (A4C): 71.6 ml 35.83 ml/m RA Volume:   56.70 ml  28.37 ml/m  AORTIC VALVE AV Area (Vmax):    1.23 cm AV  Area (Vmean):   1.17 cm AV Area (VTI):     1.31 cm AV Vmax:           335.60 cm/s AV Vmean:          242.600 cm/s AV VTI:            0.532 m AV Peak Grad:      45.1 mmHg AV Mean Grad:      28.0 mmHg LVOT Vmax:         162.00 cm/s LVOT Vmean:        112.000 cm/s LVOT VTI:          0.273 m LVOT/AV VTI ratio: 0.51  AORTA Ao Root diam: 2.50 cm MITRAL VALVE MV Area (PHT): 3.42 cm     SHUNTS MV Decel Time: 222 msec     Systemic VTI:  0.27 m MV E velocity: 114.00 cm/s  Systemic Diam: 1.80 cm MV A velocity: 95.50 cm/s MV E/A ratio:  1.19 Ena Dawley MD Electronically signed by Ena Dawley MD Signature Date/Time: 03/22/2020/1:01:34 PM    Final    ECHO TEE  Result Date: 03/26/2020    TRANSESOPHOGEAL ECHO REPORT   Patient Name:   JAROME MAZOR Pacini Date of Exam: 03/26/2020 Medical Rec #:  [id number]           Height:       70.0  in Accession #:    [id number]          Weight:       173.0 lb Date of Birth:  [birth year]           BSA:          1.963 m Patient Age:    25 years            BP:           127/92 mmHg Patient Gender: M                   HR:           76 bpm. Exam Location:  Inpatient Procedure: Transesophageal Echo, Color Doppler, Cardiac Doppler and 3D Echo Indications:     Bacteremia R78.81  History:         Patient has prior history of Echocardiogram examinations, most                  recent 03/22/2020.                  Aortic Valve: 23 mm Edwards bioprosthetic valve is present in                  the aortic position. Procedure Date: 08/16/2016.  Sonographer:     Mikki Santee RDCS (AE) Referring Phys:  [id number] Erasmo Downer FURTH Diagnosing Phys: Candee Furbish MD PROCEDURE: After discussion of the risks and benefits of a TEE, an informed consent was obtained. The transesophogeal probe was passed without difficulty through the esophogus of the patient. Sedation performed by different physician. The patient was monitored while under deep sedation. Anesthestetic sedation was provided intravenously by  Anesthesiology: 228.4mg  of Propofol. The patient's vital signs; including heart rate, blood pressure, and oxygen saturation; remained stable throughout the procedure. The patient developed no complications during the procedure. IMPRESSIONS  1. Left ventricular ejection fraction, by estimation, is 60 to 65%. The left ventricle has normal function. The left ventricle has no regional wall motion abnormalities.  2. Right ventricular systolic function is normal. The right ventricular size is normal. There is normal pulmonary artery systolic pressure.  3. No left atrial/left atrial appendage thrombus was detected.  4. The mitral valve is myxomatous. Moderate mitral valve regurgitation. No evidence of mitral stenosis.  5. The aortic valve has been repaired/replaced. Aortic valve regurgitation is not visualized. No aortic stenosis is present. There is a 23 mm Edwards bioprosthetic valve present in the aortic position. Procedure Date: 08/16/2016. Echo findings are consistent with normal structure and function of the aortic valve prosthesis.  6. The inferior vena cava is normal in size with greater than 50% respiratory variability, suggesting right atrial pressure of 3 mmHg. Conclusion(s)/Recommendation(s): Normal biventricular function without evidence of hemodynamically significant valvular heart disease. No evidence of vegetation/infective endocarditis on this transesophageal echocardiogram. FINDINGS  Left Ventricle: Left ventricular ejection fraction, by estimation, is 60 to 65%. The left ventricle has normal function. The left ventricle has no regional wall motion abnormalities. The left ventricular internal cavity size was normal in size. There is  no left ventricular hypertrophy. Right Ventricle: The right ventricular size is normal. No increase in right ventricular wall thickness. Right ventricular systolic function is normal. There is normal pulmonary artery systolic pressure. The tricuspid regurgitant velocity is  2.41 m/s, and  with an assumed right atrial pressure of 10 mmHg, the estimated right ventricular systolic pressure is [id number] mmHg. Left Atrium: Left atrial size  was normal in size. No left atrial/left atrial appendage thrombus was detected. Right Atrium: Right atrial size was normal in size. Pericardium: There is no evidence of pericardial effusion. Mitral Valve: The mitral valve is myxomatous. There is mild holosystolic prolapse of multiple segments of the anterior leaflet of the mitral valve. Normal mobility of the mitral valve leaflets. Moderate mitral valve regurgitation, with eccentric posteriorly directed jet. No evidence of mitral valve stenosis. Tricuspid Valve: The tricuspid valve is normal in structure. Tricuspid valve regurgitation is not demonstrated. No evidence of tricuspid stenosis. Aortic Valve: The aortic valve has been repaired/replaced. Aortic valve regurgitation is not visualized. No aortic stenosis is present. There is a 23 mm Edwards bioprosthetic valve present in the aortic position. Procedure Date: 08/16/2016. Echo findings are consistent with normal structure and function of the aortic valve prosthesis. Pulmonic Valve: The pulmonic valve was normal in structure. Pulmonic valve regurgitation is not visualized. No evidence of pulmonic stenosis. Aorta: The aortic root is normal in size and structure. Venous: The inferior vena cava is normal in size with greater than 50% respiratory variability, suggesting right atrial pressure of 3 mmHg. IAS/Shunts: No atrial level shunt detected by color flow Doppler.  TRICUSPID VALVE TR Peak grad:   23.2 mmHg TR Vmax:        241.00 cm/s Candee Furbish MD Electronically signed by Candee Furbish MD Signature Date/Time: 03/26/2020/10:49:18 AM    Final    VAS [place] LOWER EXTREMITY VENOUS (DVT) (MC and WL 7a-7p)  Result Date: 03/20/2020  Lower Venous DVTStudy Indications: Pain, and Edema.  Performing Technologist: June Leap RDMS, RVT  Examination Guidelines: A complete  evaluation includes B-mode imaging, spectral Doppler, color Doppler, and power Doppler as needed of all accessible portions of each vessel. Bilateral testing is considered an integral part of a complete examination. Limited examinations for reoccurring indications may be performed as noted. The reflux portion of the exam is performed with the patient in reverse Trendelenburg.  +---------+---------------+---------+-----------+----------+--------------+ LEFT     CompressibilityPhasicitySpontaneityPropertiesThrombus Aging +---------+---------------+---------+-----------+----------+--------------+ CFV      Full           Yes      Yes                                 +---------+---------------+---------+-----------+----------+--------------+ SFJ      Full                                                        +---------+---------------+---------+-----------+----------+--------------+ FV Prox  Full                                                        +---------+---------------+---------+-----------+----------+--------------+ FV Mid   Full                                                        +---------+---------------+---------+-----------+----------+--------------+ FV DistalFull                                                        +---------+---------------+---------+-----------+----------+--------------+  PFV      Full                                                        +---------+---------------+---------+-----------+----------+--------------+ POP      Full           Yes      Yes                                 +---------+---------------+---------+-----------+----------+--------------+ PTV      Full                                                        +---------+---------------+---------+-----------+----------+--------------+ PERO     Full                                                         +---------+---------------+---------+-----------+----------+--------------+     Summary: LEFT: - There is no evidence of deep vein thrombosis in the lower extremity.  - Large collection of fluid with internal echoes extending from popliteal fossa to lower calf. Appears to be large ruptured baker's cyst.  *See table(s) above for measurements and observations. Electronically signed by Monica Martinez MD on 03/20/2020 at 2:00:07 PM.    Final    [place] EKG SITE RITE  Result Date: 03/29/2020 If Site Rite image not attached, placement could not be confirmed due to current cardiac rhythm.  [place] EKG SITE RITE  Result Date: 03/26/2020 If Site Rite image not attached, placement could not be confirmed due to current cardiac rhythm.   Labs:  CBC: Recent Labs    04/02/20 0455 04/03/20 0500 04/04/20 0305 04/05/20 0458  WBC 10.5 11.1* 10.6* 8.5  HGB 7.8* 7.8* 7.8* 7.3*  HCT 25.0* 24.5* 24.5* 23.2*  PLT 276 298 319 321    COAGS: Recent Labs    03/02/20 0954 03/20/20 0331 03/20/20 1517 03/23/20 1712 03/24/20 0410 03/25/20 1210 03/26/20 0618  INR 1.2 1.8*  --   --   --   --   --   APTT  --   --    < > 79* 79* 55* 93*   < > = values in this interval not displayed.    BMP: Recent Labs    04/02/20 0455 04/03/20 0500 04/04/20 0305 04/05/20 0458  NA 135 137 135 136  K 4.2 4.1 3.9 3.8  CL 101 101 101 99  CO2 27 27 27 28   GLUCOSE 245* 114* 153* 136*  BUN 17 16 18 18   CALCIUM 8.9 9.0 9.0 8.8*  CREATININE 1.52* 1.36* 1.43* 1.27*  GFRNONAA 47* 53* 50* 58*  GFRAA 54* >60 58* >60    LIVER FUNCTION TESTS: Recent Labs    03/28/20 0532 03/29/20 0444 03/30/20 0303 04/01/20 0430  BILITOT 1.8* 1.7* 1.7* 1.4*  AST 25 38 49* 99*  ALT 73* 72* 77* 118*  ALKPHOS 75 65 76 79  PROT 5.7* 5.9* 5.7* 5.6*  ALBUMIN 1.9* 2.0* 2.0* 1.8*    TUMOR MARKERS: No results for input(s): AFPTM, CEA, CA199, CHROMGRNA in the last 8760 hours.  Assessment and Plan: L4-L5 discitis/osteomyelitis Patient  admitted with enterococcal bacteremia, sepsis.  Was progressing well with IV abx until end of last week when he developed worsening back pain.  MR Lumbar Spine shows osteomyelitis/discitis at L4-L5.  IR consulted for aspiration at the request of Neurosurgery.  Case reviewed by Dr. Karenann Cai who approves patient for procedure.  Note ID recommendation to proceed with IV abx at this time time.  Discussed procedure in detail with patient and family at bedside.  NPO p MN.  Eliquis held through the weekend for possible procedure early this week.   Risks and benefits discussed with the patient and/or patient's family including, but not limited to bleeding, infection, damage to adjacent structures or low yield requiring additional tests.  All of the questions were answered and there is agreement to proceed.  Consent signed and in chart.  Thank you for this interesting consult.  I greatly enjoyed meeting Eleazer Barquero and look forward to participating in their care.  A copy of this report was sent to the requesting provider on this date.  Electronically Signed: Docia Barrier, PA 04/05/2020, 1:43 PM   I spent a total of 40 Minutes    in face to face in clinical consultation, greater than 50% of which was counseling/coordinating care for L4-L5 osteomyelitis/discitis.

## 2020-04-05 NOTE — Progress Notes (Signed)
PHARMACY CONSULT NOTE FOR:  OUTPATIENT  PARENTERAL ANTIBIOTIC THERAPY (OPAT)  Indication: Enterococcus faecalis bacteremia/septic arthritis  Regimen: Ampicillin 8 gm every 24 hours as a continuous infusion End date: 05/20/20  IV antibiotic discharge orders are pended. To discharging provider:  please sign these orders via discharge navigator,  Select New Orders & click on the button choice - Manage This Unsigned Work.     Onnie Boer, PharmD, BCIDP, AAHIVP, CPP Infectious Disease Pharmacist 04/05/2020 10:16 AM

## 2020-04-05 NOTE — Progress Notes (Signed)
PROGRESS NOTE    Johnathan Murray  GYF:[id number] DOB: [birth year] DOA: 03/19/2020 PCP: Nicoletta Dress, MD    Brief Narrative:  69 y.o.malewithhistory of paroxysmal atrial fibrillation, hypertension, bioprosthetic aortic valve replacement who had undergone recent cervical discectomy and fusion about 3 weeks ago done by Dr. Trenton Gammon has been feeling weak with increasing pain in the neck and low back and poor appetite. Denies any vomiting or diarrhea denies taking any Tylenol but does take NSAIDs sometimes. Has not noticed any blood in the urine and has been urinating less last few days. Has subjective feeling of fever chills. Given the symptoms patient presents to the ER. Patient states over the last 3 weeks and surgery patient has been finding it difficult to ambulate because of the weakness of the lower extremity. Denies any incontinence of urine or bowel. Has been having more lower extremity edema more on the left side. In the ER on exam patient appears generally weak with weakness of the lower extremity. There is edema of the lower extremity on the left side. Labs show creatinine of 6.4 which is increased from 1.6 about 3 weeks ago. In addition patient's LFTs are markedly elevated at AST of 415 and ALT of 429 patient's albumin is 2.6 total bilirubin 5.5 bicarb 19 sodium 130 calcium 10.2 hemoglobin 10.4 lactic acid 1.6 WBC 12.6 platelets 341 urinalysis shows RBCs of 21-50 WBC 21-50 protein around 100 bacteria rare hyaline cast present mucus present. Patient was given fluid bolus and admitted for acute renal failure. Patient also had CT abdomen pelvis and CT cervical spine which did not show any acute. USS of the abdomen also was unremarkable. Covid test is negative  Assessment & Plan:   Principal Problem:   Enterococcal bacteremia Active Problems:   Essential hypertension   PAF (paroxysmal atrial fibrillation) (HCC)   Abnormal LFTs (liver function tests)   Cervical myelopathy  (HCC)   S/P aortic valve replacement   ARF (acute renal failure) (HCC)   Pseudogout of left knee   Septic arthritis (HCC)   History of fusion of cervical spine   Protein-calorie malnutrition, severe   Sepsis likely 2/2 Enterococcus faecalis bacteremia On presentation, patient was initially hypotensive, with leukocytosis Source from possible knee infection vs recent postoperative cervical/lumbar infection BC x2 growing Enterococcus faecalis, repeat BC x2 NGTD UC no growth, Chest x-ray unremarkable Lactic acid, TSH WNL, cortisol WNL TTE showed no obvious vegetation but increased transaortic gradients across the bioprosthetic valve  Cardiology was consulted for TEE. Pt underwent TEE 5/6. Valves unremarkable, no vegetation seen. Discussed with Cardiology ID recs noted, IV ampicillin will be lengthened to another 5 to 6 weeks for total of 7 to 8 weeks total from negative blood cultures through June 30th PICC placed 5/9  Acute osteomyelitis discitis of L4-L5  MRI showed above with possible epidural abscess ESR 118, CRP 12.4, both elevated Neurosurgery consulted, recommend disc aspiration by IR.  No indication for surgical intervention at present.  Recommend lumbosacral corset when out of bed for comfort IR consulted, appreciate recs, plan for aspiration on 04/06/2020 ID re-consulted, appreciate recs Continue IV antibiotics as above  Likely LLE septic knee with ruptured Baker's cyst with possibly pseudogout Doppler showed ruptured Baker's cyst on LLE Left knee x-ray showed large joint effusion, septic joint cannot be excluded Orthopedics consulted s/p LLE joint aspiration on 03/21/2020 and RLE joint aspiration on 03/22/2020 both showed positive intracellular calcium pyrophosphate crystals, WBC over 15,000, culture grew rare E faecalis from LLE, culture from  RLE without organisms Status post steroid injection in both knees on 03/22/2020 by orthopedics Now s/p I&D of bilateral knees on 03/24/2020,  stable at this time Pt agreeable for CIR, pending bed availability  AKI on CKD stage III/anion gap metabolic acidosis Stable, back to baseline Multifactorial from bacteremia, poor oral intake, in addition to medication induced from NSAID, Lasix and lisinopril use Baseline creatinine 1.3-1.6, on presentation 6.40 Noted urine with RBCs and protein-further work-up Per nephrology Held patient's Lasix, lisinopril CT abdomen does not show any obstruction Nephrology consulted earlier, since signed off Daily BMP  Hyperglycemia CBGs have been stable Likely 2/2 recent steroid injection in the knee Continue with SSI, Accu-Cheks, hypoglycemic protocol  Transaminitis Suspect secondary to above sepsis/hypotension/shock liver INR WNL Acute hepatitis panel nonreactive Home amiodarone currently on hold LFT's had been trending towards normal  Paroxysmal A. Fib/bioprosthetic aortic valve replacement Heart rate WNL Holding home amiodarone TTE as noted above Continue Eliquis  Neck pain History of recent cervical decompression surgery Imaging does not show anything acute as mentioned above MRI cervical spine with no evidence of infection PT/OT consulted, recs noted for CIR. Pending placement and bed availability  Normocytic anemia/postop anemia Anemia panel iron 43, sats 29, ferritin 3,811 Continued on oral iron supplementation Hgb down to 6.8 recently s/p 2 units PRBC's No evidence of acute blood loss Hgb has been downtrending, while on eliquis  Hypothyroidism TSH levels WNL Continue Synthroid as pt tolerates     DVT prophylaxis: Eliquis Code Status: Full Family Communication: Pt in room, wife at bedside  Status is: Inpatient  Remains inpatient appropriate because:Ongoing diagnostic testing needed not appropriate for outpatient work up and Unsafe d/c plan, need further w/u with IR   Dispo: The patient is from: Home              Anticipated d/c is to: CIR               Anticipated d/c date is: 1 day, awaiting CIR placement likely on 04/06/20              Patient currently is not medically stable to d/c.  Consultants:   ID  Orthopedic Surgery  Critical Care  Nephrology  Neurosurgery  IR  Procedures:   TEE 5/6  Antimicrobials: Anti-infectives (From admission, onward)   Start     Dose/Rate Route Frequency Ordered Stop   03/27/20 0900  cefTRIAXone (ROCEPHIN) 2 g in sodium chloride 0.9 % 100 mL IVPB  Status:  Discontinued     2 g 200 mL/hr over 30 Minutes Intravenous Every 12 hours 03/27/20 0828 03/27/20 0901   03/22/20 1515  cefTRIAXone (ROCEPHIN) 2 g in sodium chloride 0.9 % 100 mL IVPB  Status:  Discontinued     2 g 200 mL/hr over 30 Minutes Intravenous Every 12 hours 03/22/20 1442 03/26/20 1043   03/22/20 1000  ampicillin (OMNIPEN) 2 g in sodium chloride 0.9 % 100 mL IVPB     2 g 300 mL/hr over 20 Minutes Intravenous Every 6 hours 03/22/20 0925 04/21/20 2359   03/21/20 1800  ceFEPIme (MAXIPIME) 1 g in sodium chloride 0.9 % 100 mL IVPB  Status:  Discontinued     1 g 200 mL/hr over 30 Minutes Intravenous Every 24 hours 03/20/20 1143 03/21/20 0116   03/21/20 0200  ampicillin (OMNIPEN) 2 g in sodium chloride 0.9 % 100 mL IVPB  Status:  Discontinued     2 g 300 mL/hr over 20 Minutes Intravenous Every 8 hours 03/21/20  0116 03/22/20 0925   03/20/20 1230  ceFEPIme (MAXIPIME) 1 g in sodium chloride 0.9 % 100 mL IVPB     1 g 200 mL/hr over 30 Minutes Intravenous  Once 03/20/20 1143 03/20/20 1358      Subjective: Patient still complains of back and knee pain otherwise, no new complaints.  Denies any chest pain, abdominal pain, shortness of breath, cough, nausea/vomiting, fever/chills.    Objective: Vitals:   04/04/20 1615 04/04/20 2102 04/05/20 0447 04/05/20 0807  BP:  132/68 122/63 123/70  Pulse:  98 81 93  Resp:  16 16   Temp: 98.8 F (37.1 C) 99.8 F (37.7 C) 98.2 F (36.8 C)   TempSrc: Oral Oral    SpO2:  97% 100% 99%  Weight:   81.9 kg    Height:        Intake/Output Summary (Last 24 hours) at 04/05/2020 1451 Last data filed at 04/05/2020 1117 Gross per 24 hour  Intake 900 ml  Output 2650 ml  Net -1750 ml   Filed Weights   04/03/20 0436 04/04/20 0430 04/04/20 2102  Weight: 80.7 kg 81.9 kg 81.9 kg    Examination:  General: NAD   Cardiovascular: S1, S2 present  Respiratory: CTAB  Abdomen: Soft, nontender, nondistended, bowel sounds present  Musculoskeletal: No bilateral pedal edema noted, bilateral knee incisions healing, with tenderness worse on the right, as well as low back pain  Skin: Normal  Psychiatry: Normal mood   Data Reviewed: I have personally reviewed following labs and imaging studies  CBC: Recent Labs  Lab 04/01/20 0430 04/02/20 0455 04/03/20 0500 04/04/20 0305 04/05/20 0458  WBC 9.4 10.5 11.1* 10.6* 8.5  NEUTROABS 7.5 8.8* 9.2* 8.6* 6.7  HGB 7.5* 7.8* 7.8* 7.8* 7.3*  HCT 23.6* 25.0* 24.5* 24.5* 23.2*  MCV 96.3 95.1 94.6 93.5 93.9  PLT 251 276 298 319 553   Basic Metabolic Panel: Recent Labs  Lab 04/01/20 0430 04/02/20 0455 04/03/20 0500 04/04/20 0305 04/05/20 0458  NA 136 135 137 135 136  K 4.0 4.2 4.1 3.9 3.8  CL 101 101 101 101 99  CO2 '26 27 27 27 28  '$ GLUCOSE 151* 245* 114* 153* 136*  BUN '17 17 16 18 18  '$ CREATININE 1.37* 1.52* 1.36* 1.43* 1.27*  CALCIUM 8.6* 8.9 9.0 9.0 8.8*   GFR: Estimated Creatinine Clearance: 58.3 mL/min (A) (by C-G formula based on SCr of 1.27 mg/dL (H)). Liver Function Tests: Recent Labs  Lab 03/30/20 0303 04/01/20 0430  AST 49* 99*  ALT 77* 118*  ALKPHOS 76 79  BILITOT 1.7* 1.4*  PROT 5.7* 5.6*  ALBUMIN 2.0* 1.8*   No results for input(s): LIPASE, AMYLASE in the last 168 hours. No results for input(s): AMMONIA in the last 168 hours. Coagulation Profile: No results for input(s): INR, PROTIME in the last 168 hours. Cardiac Enzymes: No results for input(s): CKTOTAL, CKMB, CKMBINDEX, TROPONINI in the last 168 hours. BNP  (last 3 results) No results for input(s): PROBNP in the last 8760 hours. HbA1C: No results for input(s): HGBA1C in the last 72 hours. CBG: Recent Labs  Lab 04/04/20 1147 04/04/20 1612 04/04/20 2103 04/05/20 0647 04/05/20 1147  GLUCAP 180* 172* 220* 129* 206*   Lipid Profile: No results for input(s): CHOL, HDL, LDLCALC, TRIG, CHOLHDL, LDLDIRECT in the last 72 hours. Thyroid Function Tests: No results for input(s): TSH, T4TOTAL, FREET4, T3FREE, THYROIDAB in the last 72 hours. Anemia Panel: No results for input(s): VITAMINB12, FOLATE, FERRITIN, TIBC, IRON, RETICCTPCT in the last  72 hours. Sepsis Labs: No results for input(s): PROCALCITON, LATICACIDVEN in the last 168 hours.  Recent Results (from the past 240 hour(s))  Culture, blood (Routine X 2) w Reflex to ID Panel     Status: None   Collection Time: 03/26/20  9:15 PM   Specimen: BLOOD  Result Value Ref Range Status   Specimen Description BLOOD RIGHT ARM  Final   Special Requests   Final    BOTTLES DRAWN AEROBIC AND ANAEROBIC Blood Culture results may not be optimal due to an excessive volume of blood received in culture bottles   Culture   Final    NO GROWTH 5 DAYS Performed at [facility], [place] [address] [place], [place] [address]    Report Status 03/31/2020 FINAL  Final  Culture, blood (Routine X 2) w Reflex to ID Panel     Status: None   Collection Time: 03/26/20  9:25 PM   Specimen: BLOOD  Result Value Ref Range Status   Specimen Description BLOOD LEFT FOREARM  Final   Special Requests   Final    BOTTLES DRAWN AEROBIC AND ANAEROBIC Blood Culture results may not be optimal due to an excessive volume of blood received in culture bottles   Culture   Final    NO GROWTH 5 DAYS Performed at [facility], [place] [address], [place], [place] [address]    Report Status 03/31/2020 FINAL  Final     Radiology Studies: No results found.  Scheduled Meds: . sodium chloride   Intravenous Once  . Chlorhexidine  Gluconate Cloth  6 each Topical Daily  . docusate sodium  100 mg Oral BID  . feeding supplement (ENSURE ENLIVE)  237 mL Oral TID BM  . feeding supplement (PRO-STAT SUGAR FREE 64)  30 mL Oral BID  . insulin aspart  0-15 Units Subcutaneous TID WC  . insulin aspart  0-5 Units Subcutaneous QHS  . insulin aspart  3 Units Subcutaneous TID WC  . insulin glargine  10 Units Subcutaneous BID  . iron polysaccharides  150 mg Oral Q breakfast  . levothyroxine  25 mcg Oral Q0600  . senna  1 tablet Oral BID  . sodium chloride flush  10-40 mL Intracatheter Q12H   Continuous Infusions: . sodium chloride 250 mL (04/02/20 2230)  . ampicillin (OMNIPEN) IV 2 g (04/05/20 1116)     LOS: 16 days   Alma Friendly, MD [facility] Pager On Amion  If 7PM-7AM, please contact night-coverage 04/05/2020, 2:51 PM

## 2020-04-06 ENCOUNTER — Inpatient Hospital Stay (HOSPITAL_COMMUNITY): Payer: BC Managed Care – PPO | Admitting: Occupational Therapy

## 2020-04-06 ENCOUNTER — Inpatient Hospital Stay (HOSPITAL_COMMUNITY): Payer: BC Managed Care – PPO | Admitting: Physical Therapy

## 2020-04-06 LAB — GLUCOSE, CAPILLARY
Glucose-Capillary: 142 mg/dL — ABNORMAL HIGH (ref 70–99)
Glucose-Capillary: 119 mg/dL — ABNORMAL HIGH (ref 70–99)
Glucose-Capillary: 200 mg/dL — ABNORMAL HIGH (ref 70–99)
Glucose-Capillary: 214 mg/dL — ABNORMAL HIGH (ref 70–99)

## 2020-04-06 LAB — BASIC METABOLIC PANEL
Anion gap: 7 (ref 5–15)
BUN: 18 mg/dL (ref 8–23)
CO2: 28 mmol/L (ref 22–32)
Calcium: 8.8 mg/dL — ABNORMAL LOW (ref 8.9–10.3)
Chloride: 101 mmol/L (ref 98–111)
Creatinine, Ser: 1.31 mg/dL — ABNORMAL HIGH (ref 0.61–1.24)
GFR calc Af Amer: >60 mL/min (ref >60)
GFR calc non Af Amer: 56 mL/min — ABNORMAL LOW (ref >60)
Glucose, Bld: 156 mg/dL — ABNORMAL HIGH (ref 70–99)
Potassium: 4.1 mmol/L (ref 3.5–5.1)
Sodium: 136 mmol/L (ref 135–145)

## 2020-04-06 LAB — CBC WITH DIFFERENTIAL/PLATELET
Abs Immature Granulocytes: 0.06 10*3/uL (ref 0.00–0.07)
Basophils Absolute: 0.1 10*3/uL (ref 0.0–0.1)
Basophils Relative: 1 %
Eosinophils Absolute: 0.1 10*3/uL (ref 0.0–0.5)
Eosinophils Relative: 1 %
HCT: 23.3 % — ABNORMAL LOW (ref 39.0–52.0)
Hemoglobin: 7.4 g/dL — ABNORMAL LOW (ref 13.0–17.0)
Immature Granulocytes: 1 %
Lymphocytes Relative: 12 %
Lymphs Abs: 1.1 10*3/uL (ref 0.7–4.0)
MCH: 30 pg (ref 26.0–34.0)
MCHC: 31.8 g/dL (ref 30.0–36.0)
MCV: 94.3 fL (ref 80.0–100.0)
Monocytes Absolute: 0.6 10*3/uL (ref 0.1–1.0)
Monocytes Relative: 6 %
Neutro Abs: 7.8 10*3/uL — ABNORMAL HIGH (ref 1.7–7.7)
Neutrophils Relative %: 79 %
Platelets: 308 10*3/uL (ref 150–400)
RBC: 2.47 MIL/uL — ABNORMAL LOW (ref 4.22–5.81)
RDW: 15.4 % (ref 11.5–15.5)
WBC: 9.7 10*3/uL (ref 4.0–10.5)
nRBC: 0 % (ref 0.0–0.2)

## 2020-04-06 LAB — TYPE AND SCREEN
ABO/RH(D): A POS
Antibody Screen: NEGATIVE

## 2020-04-06 MED ORDER — CYCLOBENZAPRINE HCL 10 MG PO TABS
10.0000 mg | ORAL_TABLET | Freq: Every evening | ORAL | Status: DC | PRN
  Administered 2020-04-06: 10 mg via ORAL
  Filled 2020-04-06: qty 1

## 2020-04-06 NOTE — Progress Notes (Signed)
Patient ID: Johnathan Murray, male   DOB: 1951-08-07, 69 y.o.   MRN: [id number]   Disc aspiration cannot be performed in IR today secondary emergency procedures added onto schedule today.  Rescheduled to tomorrow RN aware New orders placed

## 2020-04-06 NOTE — Progress Notes (Signed)
PROGRESS NOTE    Johnathan Murray  WCH:[id number] DOB: February 25, 1951 DOA: 03/19/2020 PCP: Nicoletta Dress, MD    Brief Narrative:  69 y.o.malewithhistory of paroxysmal atrial fibrillation, hypertension, bioprosthetic aortic valve replacement who had undergone recent cervical discectomy and fusion about 3 weeks ago done by Dr. Trenton Gammon has been feeling weak with increasing pain in the neck and low back and poor appetite. Denies any vomiting or diarrhea denies taking any Tylenol but does take NSAIDs sometimes. Has not noticed any blood in the urine and has been urinating less last few days. Has subjective feeling of fever chills. Given the symptoms patient presents to the ER. Patient states over the last 3 weeks and surgery patient has been finding it difficult to ambulate because of the weakness of the lower extremity. Denies any incontinence of urine or bowel. Has been having more lower extremity edema more on the left side. In the ER on exam patient appears generally weak with weakness of the lower extremity. There is edema of the lower extremity on the left side. Labs show creatinine of 6.4 which is increased from 1.6 about 3 weeks ago. In addition patient's LFTs are markedly elevated at AST of 415 and ALT of 429 patient's albumin is 2.6 total bilirubin 5.5 bicarb 19 sodium 130 calcium 10.2 hemoglobin 10.4 lactic acid 1.6 WBC 12.6 platelets 341 urinalysis shows RBCs of 21-50 WBC 21-50 protein around 100 bacteria rare hyaline cast present mucus present. Patient was given fluid bolus and admitted for acute renal failure. Patient also had CT abdomen pelvis and CT cervical spine which did not show any acute. USS of the abdomen also was unremarkable. Covid test is negative  Assessment & Plan:   Principal Problem:   Enterococcal bacteremia Active Problems:   Essential hypertension   PAF (paroxysmal atrial fibrillation) (HCC)   Abnormal LFTs (liver function tests)   Cervical myelopathy  (HCC)   S/P aortic valve replacement   ARF (acute renal failure) (HCC)   Pseudogout of left knee   Septic arthritis (HCC)   History of fusion of cervical spine   Protein-calorie malnutrition, severe   Sepsis likely 2/2 Enterococcus faecalis bacteremia On presentation, patient was initially hypotensive, with leukocytosis Source from possible knee infection vs recent postoperative cervical/lumbar infection BC x2 growing Enterococcus faecalis, repeat BC x2 NGTD UC no growth, Chest x-ray unremarkable Lactic acid, TSH WNL, cortisol WNL TTE showed no obvious vegetation but increased transaortic gradients across the bioprosthetic valve  Cardiology was consulted for TEE. Pt underwent TEE 5/6. Valves unremarkable, no vegetation seen. Discussed with Cardiology ID recs noted, IV ampicillin will be lengthened to another 5 to 6 weeks for total of 7 to 8 weeks total from negative blood cultures through June 30th PICC placed 5/9  Acute osteomyelitis discitis of L4-L5  MRI showed above with possible epidural abscess ESR 118, CRP 12.4, both elevated Neurosurgery consulted, recommend disc aspiration by IR.  No indication for surgical intervention at present.  Recommend lumbosacral corset when out of bed for comfort IR consulted, appreciate recs, plan for aspiration on 04/07/2020 ID re-consulted, appreciate recs Continue IV antibiotics as above  Likely LLE septic knee with ruptured Baker's cyst with possibly pseudogout Doppler showed ruptured Baker's cyst on LLE Left knee x-ray showed large joint effusion, septic joint cannot be excluded Orthopedics consulted s/p LLE joint aspiration on 03/21/2020 and RLE joint aspiration on 03/22/2020 both showed positive intracellular calcium pyrophosphate crystals, WBC over 15,000, culture grew rare E faecalis from LLE, culture from  RLE without organisms Status post steroid injection in both knees on 03/22/2020 by orthopedics Now s/p I&D of bilateral knees on 03/24/2020,  stable at this time Pt agreeable for CIR, pending bed availability  AKI on CKD stage III/anion gap metabolic acidosis Stable, back to baseline Multifactorial from bacteremia, poor oral intake, in addition to medication induced from NSAID, Lasix and lisinopril use Baseline creatinine 1.3-1.6, on presentation 6.40 Noted urine with RBCs and protein-further work-up Per nephrology Held patient's Lasix, lisinopril CT abdomen does not show any obstruction Nephrology consulted earlier, since signed off Daily BMP  Hyperglycemia Improved CBGs have been stable Likely 2/2 recent steroid injection in the knee Continue with SSI, Accu-Cheks, hypoglycemic protocol  Transaminitis Suspect secondary to above sepsis/hypotension/shock liver INR WNL Acute hepatitis panel nonreactive Home amiodarone currently on hold LFT's had been trending towards normal  Paroxysmal A. Fib/bioprosthetic aortic valve replacement Heart rate WNL Holding home amiodarone TTE as noted above Continue Eliquis  Neck pain History of recent cervical decompression surgery Imaging does not show anything acute as mentioned above MRI cervical spine with no evidence of infection PT/OT consulted, recs noted for CIR. Pending placement and bed availability  Normocytic anemia/postop anemia Anemia panel iron 43, sats 29, ferritin 3,811 Continued on oral iron supplementation Hgb down to 6.8 recently s/p 2 units PRBC's No evidence of acute blood loss Hgb has been downtrending, while on eliquis  Hypothyroidism TSH levels WNL Continue Synthroid as pt tolerates     DVT prophylaxis: Eliquis Code Status: Full Family Communication: Pt in room, wife at bedside  Status is: Inpatient  Remains inpatient appropriate because:Ongoing diagnostic testing needed not appropriate for outpatient work up and Unsafe d/c plan, need further w/u with IR   Dispo: The patient is from: Home              Anticipated d/c is to: CIR               Anticipated d/c date is: 1 day, awaiting CIR placement likely on 04/07/20              Patient currently is not medically stable to d/c.  Consultants:   ID  Orthopedic Surgery  Critical Care  Nephrology  Neurosurgery  IR  Procedures:   TEE 5/6  Antimicrobials: Anti-infectives (From admission, onward)   Start     Dose/Rate Route Frequency Ordered Stop   03/27/20 0900  cefTRIAXone (ROCEPHIN) 2 g in sodium chloride 0.9 % 100 mL IVPB  Status:  Discontinued     2 g 200 mL/hr over 30 Minutes Intravenous Every 12 hours 03/27/20 0828 03/27/20 0901   03/22/20 1515  cefTRIAXone (ROCEPHIN) 2 g in sodium chloride 0.9 % 100 mL IVPB  Status:  Discontinued     2 g 200 mL/hr over 30 Minutes Intravenous Every 12 hours 03/22/20 1442 03/26/20 1043   03/22/20 1000  ampicillin (OMNIPEN) 2 g in sodium chloride 0.9 % 100 mL IVPB     2 g 300 mL/hr over 20 Minutes Intravenous Every 6 hours 03/22/20 0925 05/20/20 2359   03/21/20 1800  ceFEPIme (MAXIPIME) 1 g in sodium chloride 0.9 % 100 mL IVPB  Status:  Discontinued     1 g 200 mL/hr over 30 Minutes Intravenous Every 24 hours 03/20/20 1143 03/21/20 0116   03/21/20 0200  ampicillin (OMNIPEN) 2 g in sodium chloride 0.9 % 100 mL IVPB  Status:  Discontinued     2 g 300 mL/hr over 20 Minutes Intravenous Every 8 hours  03/21/20 0116 03/22/20 0925   03/20/20 1230  ceFEPIme (MAXIPIME) 1 g in sodium chloride 0.9 % 100 mL IVPB     1 g 200 mL/hr over 30 Minutes Intravenous  Once 03/20/20 1143 03/20/20 1358      Subjective: Overnight, patient noted to be confused, did not know where he was, resolved spontaneously.  Today, patient is oriented, denies any new complaints, very eager to DC to CIR for more intense rehab.    Objective: Vitals:   04/05/20 2319 04/06/20 0122 04/06/20 0545 04/06/20 0854  BP: 124/72 121/68 120/70 134/74  Pulse: (!) 108 93 80 81  Resp: _0 Temp: 99.7 F (37.6 C) 99.4 F (37.4 C) 97.8 F (36.6 C) 98.2 F (36.8  C)  TempSrc: Oral Oral Oral Oral  SpO2: 98% 98% 100% 100%  Weight:      Height:        Intake/Output Summary (Last 24 hours) at 04/06/2020 1327 Last data filed at 04/06/2020 0900 Gross per 24 hour  Intake 1651.61 ml  Output 3725 ml  Net -2073.39 ml   Filed Weights   04/03/20 0436 04/04/20 0430 04/04/20 2102  Weight: 80.7 kg 81.9 kg 81.9 kg    Examination:  General: NAD   Cardiovascular: S1, S2 present  Respiratory: CTAB  Abdomen: Soft, nontender, nondistended, bowel sounds present  Musculoskeletal: No bilateral pedal edema noted, bilateral knee incisions healing, with tenderness worse on the right, as well as low back pain  Skin: Normal  Psychiatry: Normal mood   Data Reviewed: I have personally reviewed following labs and imaging studies  CBC: Recent Labs  Lab 04/02/20 0455 04/03/20 0500 04/04/20 0305 04/05/20 0458 04/06/20 0438  WBC 10.5 11.1* 10.6* 8.5 9.7  NEUTROABS 8.8* 9.2* 8.6* 6.7 7.8*  HGB 7.8* 7.8* 7.8* 7.3* 7.4*  HCT 25.0* 24.5* 24.5* 23.2* 23.3*  MCV 95.1 94.6 93.5 93.9 94.3  PLT 276 298 319 321 517   Basic Metabolic Panel: Recent Labs  Lab 04/02/20 0455 04/03/20 0500 04/04/20 0305 04/05/20 0458 04/06/20 0438  NA 135 137 135 136 136  K 4.2 4.1 3.9 3.8 4.1  CL 101 101 101 99 101  CO2 _1 GLUCOSE 245* 114* 153* 136* 156*  BUN _2 CREATININE 1.52* 1.36* 1.43* 1.27* 1.31*  CALCIUM 8.9 9.0 9.0 8.8* 8.8*   GFR: Estimated Creatinine Clearance: 56.5 mL/min (A) (by C-G formula based on SCr of 1.31 mg/dL (H)). Liver Function Tests: Recent Labs  Lab 04/01/20 0430  AST 99*  ALT 118*  ALKPHOS 79  BILITOT 1.4*  PROT 5.6*  ALBUMIN 1.8*   No results for input(s): LIPASE, AMYLASE in the last 168 hours. No results for input(s): AMMONIA in the last 168 hours. Coagulation Profile: No results for input(s): INR, PROTIME in the last 168 hours. Cardiac Enzymes: No results for input(s): CKTOTAL, CKMB, CKMBINDEX,  TROPONINI in the last 168 hours. BNP (last 3 results) No results for input(s): PROBNP in the last 8760 hours. HbA1C: No results for input(s): HGBA1C in the last 72 hours. CBG: Recent Labs  Lab 04/05/20 1147 04/05/20 1652 04/05/20 2050 04/06/20 0708 04/06/20 1123  GLUCAP 206* 209* 91 142* 119*   Lipid Profile: No results for input(s): CHOL, HDL, LDLCALC, TRIG, CHOLHDL, LDLDIRECT in the last 72 hours. Thyroid Function Tests: No results for input(s): TSH, T4TOTAL, FREET4, T3FREE, THYROIDAB in the last 72 hours. Anemia Panel: No results for input(s): VITAMINB12, FOLATE, FERRITIN, TIBC, IRON,  RETICCTPCT in the last 72 hours. Sepsis Labs: No results for input(s): PROCALCITON, LATICACIDVEN in the last 168 hours.  No results found for this or any previous visit (from the past 240 hour(s)).   Radiology Studies: No results found.  Scheduled Meds: . sodium chloride   Intravenous Once  . Chlorhexidine Gluconate Cloth  6 each Topical Daily  . docusate sodium  100 mg Oral BID  . feeding supplement (ENSURE ENLIVE)  237 mL Oral TID BM  . feeding supplement (PRO-STAT SUGAR FREE 64)  30 mL Oral BID  . insulin aspart  0-15 Units Subcutaneous TID WC  . insulin aspart  0-5 Units Subcutaneous QHS  . insulin aspart  3 Units Subcutaneous TID WC  . insulin glargine  10 Units Subcutaneous BID  . iron polysaccharides  150 mg Oral Q breakfast  . levothyroxine  25 mcg Oral Q0600  . senna  1 tablet Oral BID  . sodium chloride flush  10-40 mL Intracatheter Q12H   Continuous Infusions: . sodium chloride 250 mL (04/02/20 2230)  . ampicillin (OMNIPEN) IV 2 g (04/06/20 1034)     LOS: 17 days   Alma Friendly, MD [facility] Pager On Amion  If 7PM-7AM, please contact night-coverage 04/06/2020, 1:27 PM

## 2020-04-06 NOTE — Progress Notes (Signed)
Physical Therapy Treatment Patient Details Name: Johnathan Murray MRN: [id number] DOB: 28-Aug-1951 Today's Date: 04/06/2020    History of Present Illness Pt is a 69 y/o male s/p ACDF C3-4 on 4/12, admitted to [facility] on 4/30 for progressive weakness, fatigue, bilateral knee pain L>R. Pt with AKI, bacteremia, L septic knee with plan for I&D bilateral knees on 5/4 pm . PMHx includes previous ACDF C4-6 (04/2019), lumbar sx (05/2019), CAD, DM, HTN, hx of CABGNow s/p I&D of bilateral knees on 03/24/2020    PT Comments    Continuing work on functional mobility and activity tolerance;  Session focused on initial use of back brace for moving OOB; Noting smoother rolling and transition sidelying to sit; more painful with sit to stand; Once up, steps and RW progression were smoother; Will need reinforcement for brace fit and don/doff; Continue to recommend comprehensive inpatient rehab (CIR) for post-acute therapy needs.    Follow Up Recommendations  CIR     Equipment Recommendations  None recommended by PT    Recommendations for Other Services       Precautions / Restrictions Precautions Precautions: Fall;Cervical Precaution Comments: ACDF on 4/12, pt reports they d/c-ed collar 1 week after surgery Required Braces or Orthoses: Spinal Brace Spinal Brace: Lumbar corset Other Brace: for comfort    Mobility  Bed Mobility Overal bed mobility: Needs Assistance Bed Mobility: Sidelying to Sit         Sit to sidelying: Mod assist;+2 for safety/equipment General bed mobility comments: Smoother transition to sitting today  Transfers Overall transfer level: Needs assistance Equipment used: Rolling walker (2 wheeled) Transfers: Sit to/from Stand Sit to Stand: +2 physical assistance;Mod assist         General transfer comment: Stood from elevated bed to RW; Very painful  Ambulation/Gait Ambulation/Gait assistance: Mod assist;+2 safety/equipment Gait Distance (Feet): 10 Feet Assistive  device: Rolling walker (2 wheeled) Gait Pattern/deviations: Decreased step length - right;Decreased step length - left;Trunk flexed     General Gait Details: pt with very slow, cautious and guarded gait; frequent cueing and mod assist for safe use/advancement of RW;  low back pain while taking steps   Stairs             Wheelchair Mobility    Modified Rankin (Stroke Patients Only)       Balance     Sitting balance-Leahy Scale: Fair       Standing balance-Leahy Scale: Poor Standing balance comment: heavy reliance on RW with BUEs.                             Cognition Arousal/Alertness: Awake/alert Behavior During Therapy: WFL for tasks assessed/performed Overall Cognitive Status: Within Functional Limits for tasks assessed                                        Exercises      General Comments        Pertinent Vitals/Pain Pain Assessment: Faces Faces Pain Scale: Hurts worst Pain Location: Back and bilateral knees, left knee more than right; Overall back pain is the worst Pain Descriptors / Indicators: Cramping;Discomfort Pain Intervention(s): Monitored during session;Premedicated before session;Repositioned    Home Living                      Prior Function  PT Goals (current goals can now be found in the care plan section) Acute Rehab PT Goals Patient Stated Goal: go home, feel stronger PT Goal Formulation: With patient Time For Goal Achievement: 04/07/20 Potential to Achieve Goals: Good Progress towards PT goals: Progressing toward goals(slowly)    Frequency    Min 3X/week      PT Plan Current plan remains appropriate    Co-evaluation              AM-PAC PT "6 Clicks" Mobility   Outcome Measure  Help needed turning from your back to your side while in a flat bed without using bedrails?: None Help needed moving from lying on your back to sitting on the side of a flat bed without  using bedrails?: None Help needed moving to and from a bed to a chair (including a wheelchair)?: A Lot Help needed standing up from a chair using your arms (e.g., wheelchair or bedside chair)?: A Lot Help needed to walk in hospital room?: A Lot Help needed climbing 3-5 steps with a railing? : Total 6 Click Score: 15    End of Session Equipment Utilized During Treatment: Gait belt Activity Tolerance: Patient tolerated treatment well Patient left: in chair;with call bell/phone within reach;with family/visitor present;with chair alarm set Nurse Communication: Mobility status PT Visit Diagnosis: Muscle weakness (generalized) (M62.81);Difficulty in walking, not elsewhere classified (R26.2)     Time: [id number] PT Time Calculation (min) (ACUTE ONLY): 28 min  Charges:  $Gait Training: 8-22 mins $Therapeutic Activity: 8-22 mins                     Roney Marion, PT  Acute Rehabilitation Services Pager [phone] Office [place] 04/06/2020, 5:20 PM

## 2020-04-06 NOTE — Progress Notes (Signed)
Physical Therapy Treatment Patient Details Name: Johnathan Murray MRN: [id number] DOB: [birth year] Today's Date: 04/06/2020    History of Present Illness Pt is a 69 y/o male s/p ACDF C3-4 on 4/12, admitted to [facility] on 4/30 for progressive weakness, fatigue, bilateral knee pain L>R. Pt with AKI, bacteremia, L septic knee with plan for I&D bilateral knees on 5/4 pm . PMHx includes previous ACDF C4-6 (04/2019), lumbar sx (05/2019), CAD, DM, HTN, hx of CABGNow s/p I&D of bilateral knees on 03/24/2020    PT Comments    Continuing work on functional mobility and activity tolerance;  Returned to room to [employer] with transfer back to bed; Still needing considerable amount of assist to rise from low seat of recliner; Tolerated approx 45 minutes OOB in the recliner before needing to get back to bed;   Continue to recommend comprehensive inpatient rehab (CIR) for post-acute therapy needs.    Follow Up Recommendations  CIR     Equipment Recommendations  None recommended by PT    Recommendations for Other Services       Precautions / Restrictions Precautions Precautions: Fall;Cervical Precaution Comments: ACDF on 4/12, pt reports they d/c-ed collar 1 week after surgery Required Braces or Orthoses: Spinal Brace Spinal Brace: Lumbar corset Other Brace: for comfort    Mobility  Bed Mobility Overal bed mobility: Needs Assistance Bed Mobility: Sit to Sidelying         Sit to sidelying: Mod assist General bed mobility comments: Mod assist to help LEs onto bed  Transfers Overall transfer level: Needs assistance Equipment used: Rolling walker (2 wheeled) Transfers: Sit to/from Stand Sit to Stand: +2 physical assistance;Mod assist         General transfer comment: stood from recliner with bilateral support; slow rise, painful  Ambulation/Gait Ambulation/Gait assistance: Min assist;+2 safety/equipment Gait Distance (Feet): (pivotal steps recliner back to bed) Assistive device:  Rolling walker (2 wheeled) Gait Pattern/deviations: Decreased step length - right;Decreased step length - left;Trunk flexed     General Gait Details: Slow, small steps to turn to get to bed   Stairs             Wheelchair Mobility    Modified Rankin (Stroke Patients Only)       Balance     Sitting balance-Leahy Scale: Fair       Standing balance-Leahy Scale: Poor Standing balance comment: heavy reliance on RW with BUEs.                             Cognition Arousal/Alertness: Awake/alert Behavior During Therapy: WFL for tasks assessed/performed Overall Cognitive Status: Within Functional Limits for tasks assessed                                        Exercises      General Comments        Pertinent Vitals/Pain Pain Assessment: Faces Faces Pain Scale: Hurts whole lot Pain Location: Back and bilateral knees, left knee more than right; Overall back pain is the worst Pain Descriptors / Indicators: Cramping;Discomfort Pain Intervention(s): Monitored during session    Home Living                      Prior Function            PT Goals (current goals can now  be found in the care plan section) Acute Rehab PT Goals Patient Stated Goal: go home, feel stronger PT Goal Formulation: With patient Time For Goal Achievement: 04/07/20 Potential to Achieve Goals: Good Progress towards PT goals: Progressing toward goals(slowly)    Frequency    Min 3X/week      PT Plan Current plan remains appropriate    Co-evaluation              AM-PAC PT "6 Clicks" Mobility   Outcome Measure  Help needed turning from your back to your side while in a flat bed without using bedrails?: None Help needed moving from lying on your back to sitting on the side of a flat bed without using bedrails?: None Help needed moving to and from a bed to a chair (including a wheelchair)?: A Lot Help needed standing up from a chair using your  arms (e.g., wheelchair or bedside chair)?: A Lot Help needed to walk in hospital room?: A Lot Help needed climbing 3-5 steps with a railing? : Total 6 Click Score: 15    End of Session Equipment Utilized During Treatment: Gait belt;Back brace Activity Tolerance: Patient tolerated treatment well Patient left: in bed;with call bell/phone within reach;with family/visitor present Nurse Communication: Mobility status PT Visit Diagnosis: Muscle weakness (generalized) (M62.81);Difficulty in walking, not elsewhere classified (R26.2)     Time: [id number] PT Time Calculation (min) (ACUTE ONLY): 14 min  Charges:  $Gait Training: 8-22 mins $Therapeutic Activity: 8-22 mins                     Roney Marion, PT  Acute Rehabilitation Services Pager [phone] Office [place] 04/06/2020, 7:10 PM

## 2020-04-06 NOTE — Progress Notes (Signed)
   04/05/20 2152  Assess: MEWS Score  Level of Consciousness New agitation confusion (No agitation - Confused 2001, hosp/clinic, unsure why admit)  Assess: MEWS Score  MEWS Temp 1  MEWS Systolic 0  MEWS Pulse 0  MEWS RR 0  MEWS LOC 1  MEWS Score 2  MEWS Score Color Yellow  Assess: if the MEWS score is Yellow or Red  Were vital signs taken at a resting state? Yes  Focused Assessment Documented focused assessment  Early Detection of Sepsis Score *See Row Information* Medium  MEWS guidelines implemented *See Row Information* Yes  Treat  MEWS Interventions Escalated (See documentation below)  Take Vital Signs  Increase Vital Sign Frequency  Yellow: Q 2hr X 2 then Q 4hr X 2, if remains yellow, continue Q 4hrs  Escalate  MEWS: Escalate Yellow: discuss with charge nurse/RN and consider discussing with provider and RRT  Notify: Charge Nurse/RN  Name of Charge Nurse/RN Notified Carolyne Littles (co-assessed patient with me)  Date Charge Nurse/RN Notified 04/05/20  Time Charge Nurse/RN Notified 2152  Notify: Provider  Provider Name/Title Blount NP  Date Provider Notified 04/05/20  Time Provider Notified 2152  Notification Type Page  Notification Reason Change in status  Response See new orders  Date of Provider Response 04/05/20  Time of Provider Response 2156  Document  Progress note created (see row info) Yes   Patient is normally A&Ox4.  At 1900, CCM documented HR @ 120 Sinus Tachycardia.  At 2050, Temp was 100.9 orally.  At 2152, patient appears slightly agitated.  He states that the year is 2001 and the Month is April.  He knows he is in a clinic or hospital but cannot name [facility].  He is also unsure why he was admitted.  Verified with CCM that the highest patient's HR went was 120 and he stayed SR.  He did not convert to AFIB at any time.  MEWS score became yellow.  Respiratory status WNL.  Blount NP made aware.  Orders for 500 NS Bolus and Tylenol received and implemented.   Rapid Response RN, Loma Sousa, made aware of situation also.  Yellow Mews Score VS Protocol implemented.  Patient's temperature at 0122 was 99.4 orally.  LOC returned to WNL.  Oxycodone 10 mg given at 0013 for lower back pain after patient demonstrated sustained LOC WNL.  Trazadone held to adequately assess patient's LOC during remainder of shift.  Will continue to monitor patient.  Earleen Reaper RN

## 2020-04-06 NOTE — Progress Notes (Signed)
Subjective:   Patient is laying comfortably in bed this morning. Reports back pain to be moderate and knee pain to be mild this morning. Feels like knee swelling is improving as well as knee extension bilaterally.   Objective:  PE: VITALS:   Vitals:   04/05/20 2050 04/05/20 2319 04/06/20 0122 04/06/20 0545  BP: 118/80 124/72 121/68 120/70  Pulse: 98 (!) 108 93 80  Resp: 16 20 17 17   Temp: (!) 100.9 F (38.3 C) 99.7 F (37.6 C) 99.4 F (37.4 C) 97.8 F (36.6 C)  TempSrc: Oral Oral Oral Oral  SpO2: 98% 98% 98% 100%  Weight:      Height:       General: Laying comfortably in bed. Oriented x 3.  GI: abdomen non-distended, non-tender MSK: Portal sites appear to be healing well. Continued effusions of bilateral knees, left worse than right though improved since admission. Mild pain with AROM of both knee, left worse than right. Approx. 5-45 degrees ROM of Left knee while laying in bed, 5-50 of right knee. Distal sensation intact bilaterally. EHL and FHL intact bilaterally. 2+ DP pulses.   LABS  Results for orders placed or performed during the hospital encounter of 03/19/20 (from the past 24 hour(s))  Glucose, capillary     Status: Abnormal   Collection Time: 04/05/20 11:47 AM  Result Value Ref Range   Glucose-Capillary 206 (H) 70 - 99 mg/dL  Glucose, capillary     Status: Abnormal   Collection Time: 04/05/20  4:52 PM  Result Value Ref Range   Glucose-Capillary 209 (H) 70 - 99 mg/dL  Glucose, capillary     Status: None   Collection Time: 04/05/20  8:50 PM  Result Value Ref Range   Glucose-Capillary 91 70 - 99 mg/dL  Type and screen [facility]     Status: None   Collection Time: 04/06/20  4:29 AM  Result Value Ref Range   ABO/RH(D) A POS    Antibody Screen NEG    Sample Expiration      04/09/2020,2359 Performed at [facility], [place] [address], [place], [place] [address]   CBC with Differential/Platelet     Status: Abnormal   Collection Time:  04/06/20  4:38 AM  Result Value Ref Range   WBC 9.7 4.0 - 10.5 K/uL   RBC 2.47 (L) 4.22 - 5.81 MIL/uL   Hemoglobin 7.4 (L) 13.0 - 17.0 g/dL   HCT 23.3 (L) 39.0 - 52.0 %   MCV 94.3 80.0 - 100.0 fL   MCH 30.0 26.0 - 34.0 pg   MCHC 31.8 30.0 - 36.0 g/dL   RDW 15.4 11.5 - 15.5 %   Platelets 308 150 - 400 K/uL   nRBC 0.0 0.0 - 0.2 %   Neutrophils Relative % 79 %   Neutro Abs 7.8 (H) 1.7 - 7.7 K/uL   Lymphocytes Relative 12 %   Lymphs Abs 1.1 0.7 - 4.0 K/uL   Monocytes Relative 6 %   Monocytes Absolute 0.6 0.1 - 1.0 K/uL   Eosinophils Relative 1 %   Eosinophils Absolute 0.1 0.0 - 0.5 K/uL   Basophils Relative 1 %   Basophils Absolute 0.1 0.0 - 0.1 K/uL   Immature Granulocytes 1 %   Abs Immature Granulocytes 0.06 0.00 - 0.07 K/uL  Basic metabolic panel     Status: Abnormal   Collection Time: 04/06/20  4:38 AM  Result Value Ref Range   Sodium 136 135 - 145  mmol/L   Potassium 4.1 3.5 - 5.1 mmol/L   Chloride 101 98 - 111 mmol/L   CO2 28 22 - 32 mmol/L   Glucose, Bld 156 (H) 70 - 99 mg/dL   BUN 18 8 - 23 mg/dL   Creatinine, Ser 1.31 (H) 0.61 - 1.24 mg/dL   Calcium 8.8 (L) 8.9 - 10.3 mg/dL   GFR calc non Af Amer 56 (L) >60 mL/min   GFR calc Af Amer >60 >60 mL/min   Anion gap 7 5 - 15  Glucose, capillary     Status: Abnormal   Collection Time: 04/06/20  7:08 AM  Result Value Ref Range   Glucose-Capillary 142 (H) 70 - 99 mg/dL    No results found.  Assessment/Plan: Principal Problem:   Enterococcal bacteremia Active Problems:   Essential hypertension   PAF (paroxysmal atrial fibrillation) (HCC)   Abnormal LFTs (liver function tests)   Cervical myelopathy (HCC)   S/P aortic valve replacement   ARF (acute renal failure) (HCC)   Pseudogout of left knee   Septic arthritis (HCC)   History of fusion of cervical spine   Protein-calorie malnutrition, severe  Septic arthritis s/p bilateral knee irrigation and debridement - Initially aspiration of left knee showed intracellular  calcium pyrophosphate crystals with only 15,000 white blood cells -  consistent with pseudogout, however, his Gram stain and culture and sensitivity grew out Enterococcus faecalis - no growth from right knee aspirate - continue to WBAT bilateral knees - arthroscopic incisions appear to be healing well - continue PT and OT, encouraged stretching especially right calf and hamstrings - awaiting CIR placement - had bed but currently waiting on results of disc aspiration for further discharge planning - will continue to follow along intermittently    Neck and Low Back Pain - low back pain improved with oxycodone cervical discectomy with fusion performed 5 weeks ago, history of July 2020 lumbar decompressive surgery at L4-L5 - c-spine MRI performed 5/3 showed no evidence of infection - L-spine MRI performed 5/13 showed scute osteomyelitis discitis at L4-5, associated soft tissue infection within the psoas muscle, additional multilevel degenerative spondylosis most pronounced at L3-L4 - neurosurgery consulted, recommended IR aspiration    Ventura Bruns 04/06/2020, 8:47 AM    Contact information:   Weekdays 8-5 Merlene Pulling, PA-C [phone] A fter hours and holidays please check [email or web address] for group call information for [facility]

## 2020-04-06 NOTE — Progress Notes (Signed)
Inpatient Rehab Admissions Coordinator:   Met with pt and wife at bedside. Still pending disc aspiration with IR, on hold today due to scheduling.  Will continue to follow for timing of possible CIR admit following aspiration.    Shann Medal, PT, DPT Admissions Coordinator [phone] 04/06/20  3:01 PM

## 2020-04-06 NOTE — Progress Notes (Signed)
[facility]  Date of Admission:  03/19/2020      Total days of antibiotics 17   Ampicillin            ASSESSMENT: Johnathan Murray is a 69 y.o. male with history of recent enterococcal bacteremia and bilateral knee septic arthritis (growth + enterococcus faecalis, WBC > 15,000 now s/p I&D 5/04. TEE revealed no concern over bioprosthetic valve involvement; currently on treatment with ampicillin infusions. He has since had an acute discitis evolve at L4-5 and possible epidural abscess seen on MRI. His neurologic exam is intact but probably a little limited from knee pain/discomfort.   We are awaiting IR to aspirate the disc space - will follow aspirate results to see if any new information once done.  PICC is functioning well.    PLAN: 1. Continue ampicillin IV 2. Follow micro after disc aspiration is complete.  3. Hopefully he can get to CIR soon for rehab.    Principal Problem:   Enterococcal bacteremia Active Problems:   Essential hypertension   PAF (paroxysmal atrial fibrillation) (HCC)   Abnormal LFTs (liver function tests)   Cervical myelopathy (HCC)   S/P aortic valve replacement   ARF (acute renal failure) (HCC)   Pseudogout of left knee   Septic arthritis (HCC)   History of fusion of cervical spine   Protein-calorie malnutrition, severe   . sodium chloride   Intravenous Once  . Chlorhexidine Gluconate Cloth  6 each Topical Daily  . docusate sodium  100 mg Oral BID  . feeding supplement (ENSURE ENLIVE)  237 mL Oral TID BM  . feeding supplement (PRO-STAT SUGAR FREE 64)  30 mL Oral BID  . insulin aspart  0-15 Units Subcutaneous TID WC  . insulin aspart  0-5 Units Subcutaneous QHS  . insulin aspart  3 Units Subcutaneous TID WC  . insulin glargine  10 Units Subcutaneous BID  . iron polysaccharides  150 mg Oral Q breakfast  . levothyroxine  25 mcg Oral Q0600  . senna  1 tablet Oral BID  . sodium chloride flush  10-40 mL  Intracatheter Q12H    SUBJECTIVE: He and his wife are watching TV. Disappointed his procedure was bumped today for emergencies. Hopeful he can get his lunch tray soon.  His knees are improving slowly - anxious to get to CIR to help with rehab and recovery.  No trouble with the IV antibiotics.    Review of Systems: Review of Systems  Constitutional: Negative for chills, fever, malaise/fatigue and weight loss.  HENT: Negative for sore throat.   Respiratory: Negative for cough and sputum production.   Cardiovascular: Negative for chest pain and leg swelling.  Gastrointestinal: Negative for abdominal pain, diarrhea and vomiting.  Genitourinary: Negative for dysuria and flank pain.  Musculoskeletal: Positive for back pain and joint pain. Negative for myalgias and neck pain.  Skin: Negative for rash.  Neurological: Negative for dizziness, tingling and headaches.  Psychiatric/Behavioral: Negative for depression and substance abuse. The patient is not nervous/anxious and does not have insomnia.     Allergies  Allergen Reactions  . Atorvastatin Other (See Comments)    Urinary retention   . Nebivolol Diarrhea    OBJECTIVE: Vitals:   04/05/20 2319 04/06/20 0122 04/06/20 0545 04/06/20 0854  BP: 124/72 121/68 120/70 134/74  Pulse: (!) 108 93 80 81  Resp: 20 17 17 18   Temp: 99.7 F (37.6 C) 99.4 F (37.4 C) 97.8  F (36.6 C) 98.2 F (36.8 C)  TempSrc: Oral Oral Oral Oral  SpO2: 98% 98% 100% 100%  Weight:      Height:       Body mass index is 25.91 kg/m.  Physical Exam Vitals reviewed.  Constitutional:      Appearance: He is well-developed.     Comments: Seated comfortably in chair during visit.   HENT:     Mouth/Throat:     Dentition: Normal dentition. No dental abscesses.  Cardiovascular:     Rate and Rhythm: Normal rate and regular rhythm.     Heart sounds: Normal heart sounds.  Pulmonary:     Effort: Pulmonary effort is normal.     Breath sounds: Normal breath  sounds.  Abdominal:     General: There is no distension.     Palpations: Abdomen is soft.     Tenderness: There is no abdominal tenderness.  Musculoskeletal:     Comments: Knees appear a little swollen bilaterally but not warm or tender to the touch.   Lymphadenopathy:     Cervical: No cervical adenopathy.  Skin:    General: Skin is warm and dry.     Findings: No rash.  Neurological:     Mental Status: He is alert and oriented to person, place, and time.  Psychiatric:        Judgment: Judgment normal.     Lab Results Lab Results  Component Value Date   WBC 9.7 04/06/2020   HGB 7.4 (L) 04/06/2020   HCT 23.3 (L) 04/06/2020   MCV 94.3 04/06/2020   PLT 308 04/06/2020    Lab Results  Component Value Date   CREATININE 1.31 (H) 04/06/2020   BUN 18 04/06/2020   NA 136 04/06/2020   K 4.1 04/06/2020   CL 101 04/06/2020   CO2 28 04/06/2020    Lab Results  Component Value Date   ALT 118 (H) 04/01/2020   AST 99 (H) 04/01/2020   ALKPHOS 79 04/01/2020   BILITOT 1.4 (H) 04/01/2020     Microbiology: No results found for this or any previous visit (from the past 240 hour(s)).   Janene Madeira, MSN, NP-C [facility] for Infectious Disease [email or web address] Pager: [phone] Office: [phone] [place]: [phone]

## 2020-04-07 ENCOUNTER — Other Ambulatory Visit: Payer: Self-pay

## 2020-04-07 ENCOUNTER — Inpatient Hospital Stay (HOSPITAL_COMMUNITY)
Admission: RE | Admit: 2020-04-07 | Discharge: 2020-04-13 | DRG: 945 | Disposition: A | Discharge status: Other Acute Inpatient Hospital | Payer: BC Managed Care – PPO | Source: Intra-hospital | Attending: Physical Medicine and Rehabilitation | Admitting: Physical Medicine and Rehabilitation

## 2020-04-07 ENCOUNTER — Inpatient Hospital Stay (HOSPITAL_COMMUNITY): Payer: BC Managed Care – PPO

## 2020-04-07 DIAGNOSIS — I4892 Unspecified atrial flutter: Secondary | ICD-10-CM | POA: Diagnosis not present

## 2020-04-07 DIAGNOSIS — I483 Typical atrial flutter: Secondary | ICD-10-CM | POA: Diagnosis not present

## 2020-04-07 DIAGNOSIS — E1151 Type 2 diabetes mellitus with diabetic peripheral angiopathy without gangrene: Secondary | ICD-10-CM | POA: Diagnosis present

## 2020-04-07 DIAGNOSIS — E1169 Type 2 diabetes mellitus with other specified complication: Secondary | ICD-10-CM | POA: Diagnosis present

## 2020-04-07 DIAGNOSIS — Z953 Presence of xenogenic heart valve: Secondary | ICD-10-CM

## 2020-04-07 DIAGNOSIS — M4626 Osteomyelitis of vertebra, lumbar region: Secondary | ICD-10-CM | POA: Diagnosis present

## 2020-04-07 DIAGNOSIS — D631 Anemia in chronic kidney disease: Secondary | ICD-10-CM | POA: Diagnosis present

## 2020-04-07 DIAGNOSIS — Z7984 Long term (current) use of oral hypoglycemic drugs: Secondary | ICD-10-CM

## 2020-04-07 DIAGNOSIS — E039 Hypothyroidism, unspecified: Secondary | ICD-10-CM | POA: Diagnosis present

## 2020-04-07 DIAGNOSIS — N179 Acute kidney failure, unspecified: Secondary | ICD-10-CM | POA: Diagnosis not present

## 2020-04-07 DIAGNOSIS — G4700 Insomnia, unspecified: Secondary | ICD-10-CM | POA: Diagnosis not present

## 2020-04-07 DIAGNOSIS — E785 Hyperlipidemia, unspecified: Secondary | ICD-10-CM | POA: Diagnosis present

## 2020-04-07 DIAGNOSIS — Z85828 Personal history of other malignant neoplasm of skin: Secondary | ICD-10-CM

## 2020-04-07 DIAGNOSIS — Z7901 Long term (current) use of anticoagulants: Secondary | ICD-10-CM

## 2020-04-07 DIAGNOSIS — I330 Acute and subacute infective endocarditis: Secondary | ICD-10-CM

## 2020-04-07 DIAGNOSIS — Z79890 Hormone replacement therapy (postmenopausal): Secondary | ICD-10-CM

## 2020-04-07 DIAGNOSIS — R5381 Other malaise: Principal | ICD-10-CM | POA: Diagnosis present

## 2020-04-07 DIAGNOSIS — G8929 Other chronic pain: Secondary | ICD-10-CM | POA: Diagnosis present

## 2020-04-07 DIAGNOSIS — E1122 Type 2 diabetes mellitus with diabetic chronic kidney disease: Secondary | ICD-10-CM | POA: Diagnosis not present

## 2020-04-07 DIAGNOSIS — I482 Chronic atrial fibrillation, unspecified: Secondary | ICD-10-CM | POA: Diagnosis not present

## 2020-04-07 DIAGNOSIS — M4646 Discitis, unspecified, lumbar region: Secondary | ICD-10-CM | POA: Diagnosis not present

## 2020-04-07 DIAGNOSIS — T826XXA Infection and inflammatory reaction due to cardiac valve prosthesis, initial encounter: Secondary | ICD-10-CM | POA: Diagnosis not present

## 2020-04-07 DIAGNOSIS — Z888 Allergy status to other drugs, medicaments and biological substances status: Secondary | ICD-10-CM

## 2020-04-07 DIAGNOSIS — M4640 Discitis, unspecified, site unspecified: Secondary | ICD-10-CM

## 2020-04-07 DIAGNOSIS — A4181 Sepsis due to Enterococcus: Secondary | ICD-10-CM | POA: Diagnosis not present

## 2020-04-07 DIAGNOSIS — I480 Paroxysmal atrial fibrillation: Secondary | ICD-10-CM | POA: Diagnosis not present

## 2020-04-07 DIAGNOSIS — M009 Pyogenic arthritis, unspecified: Secondary | ICD-10-CM | POA: Diagnosis not present

## 2020-04-07 DIAGNOSIS — N183 Chronic kidney disease, stage 3 unspecified: Secondary | ICD-10-CM | POA: Diagnosis present

## 2020-04-07 DIAGNOSIS — Z981 Arthrodesis status: Secondary | ICD-10-CM

## 2020-04-07 DIAGNOSIS — H3530 Unspecified macular degeneration: Secondary | ICD-10-CM | POA: Diagnosis present

## 2020-04-07 DIAGNOSIS — M11262 Other chondrocalcinosis, left knee: Secondary | ICD-10-CM | POA: Diagnosis present

## 2020-04-07 DIAGNOSIS — Y831 Surgical operation with implant of artificial internal device as the cause of abnormal reaction of the patient, or of later complication, without mention of misadventure at the time of the procedure: Secondary | ICD-10-CM | POA: Diagnosis present

## 2020-04-07 DIAGNOSIS — I129 Hypertensive chronic kidney disease with stage 1 through stage 4 chronic kidney disease, or unspecified chronic kidney disease: Secondary | ICD-10-CM | POA: Diagnosis not present

## 2020-04-07 DIAGNOSIS — I2510 Atherosclerotic heart disease of native coronary artery without angina pectoris: Secondary | ICD-10-CM | POA: Diagnosis present

## 2020-04-07 DIAGNOSIS — B952 Enterococcus as the cause of diseases classified elsewhere: Secondary | ICD-10-CM

## 2020-04-07 DIAGNOSIS — I38 Endocarditis, valve unspecified: Secondary | ICD-10-CM

## 2020-04-07 DIAGNOSIS — I25810 Atherosclerosis of coronary artery bypass graft(s) without angina pectoris: Secondary | ICD-10-CM | POA: Diagnosis not present

## 2020-04-07 DIAGNOSIS — M4647 Discitis, unspecified, lumbosacral region: Secondary | ICD-10-CM | POA: Diagnosis not present

## 2020-04-07 HISTORY — DX: Discitis, unspecified, site unspecified: M46.40

## 2020-04-07 HISTORY — PX: IR LUMBAR DISC ASPIRATION W/IMG GUIDE: IMG5306

## 2020-04-07 LAB — CBC WITH DIFFERENTIAL/PLATELET
Abs Immature Granulocytes: 0.06 10*3/uL (ref 0.00–0.07)
Basophils Absolute: 0.1 10*3/uL (ref 0.0–0.1)
Basophils Relative: 1 %
Eosinophils Absolute: 0.2 10*3/uL (ref 0.0–0.5)
Eosinophils Relative: 2 %
HCT: 23.9 % — ABNORMAL LOW (ref 39.0–52.0)
Hemoglobin: 7.6 g/dL — ABNORMAL LOW (ref 13.0–17.0)
Immature Granulocytes: 1 %
Lymphocytes Relative: 14 %
Lymphs Abs: 1.1 10*3/uL (ref 0.7–4.0)
MCH: 29.9 pg (ref 26.0–34.0)
MCHC: 31.8 g/dL (ref 30.0–36.0)
MCV: 94.1 fL (ref 80.0–100.0)
Monocytes Absolute: 0.3 10*3/uL (ref 0.1–1.0)
Monocytes Relative: 4 %
Neutro Abs: 6 10*3/uL (ref 1.7–7.7)
Neutrophils Relative %: 78 %
Platelets: 340 10*3/uL (ref 150–400)
RBC: 2.54 MIL/uL — ABNORMAL LOW (ref 4.22–5.81)
RDW: 15.5 % (ref 11.5–15.5)
WBC: 7.7 10*3/uL (ref 4.0–10.5)
nRBC: 0 % (ref 0.0–0.2)

## 2020-04-07 LAB — GLUCOSE, CAPILLARY
Glucose-Capillary: 149 mg/dL — ABNORMAL HIGH (ref 70–99)
Glucose-Capillary: 188 mg/dL — ABNORMAL HIGH (ref 70–99)
Glucose-Capillary: 204 mg/dL — ABNORMAL HIGH (ref 70–99)
Glucose-Capillary: 134 mg/dL — ABNORMAL HIGH (ref 70–99)
Glucose-Capillary: 170 mg/dL — ABNORMAL HIGH (ref 70–99)

## 2020-04-07 LAB — BASIC METABOLIC PANEL
Anion gap: 9 (ref 5–15)
BUN: 21 mg/dL (ref 8–23)
CO2: 26 mmol/L (ref 22–32)
Calcium: 9 mg/dL (ref 8.9–10.3)
Chloride: 101 mmol/L (ref 98–111)
Creatinine, Ser: 1.32 mg/dL — ABNORMAL HIGH (ref 0.61–1.24)
GFR calc Af Amer: >60 mL/min (ref >60)
GFR calc non Af Amer: 55 mL/min — ABNORMAL LOW (ref >60)
Glucose, Bld: 175 mg/dL — ABNORMAL HIGH (ref 70–99)
Potassium: 4.2 mmol/L (ref 3.5–5.1)
Sodium: 136 mmol/L (ref 135–145)

## 2020-04-07 MED ORDER — INSULIN ASPART 100 UNIT/ML ~~LOC~~ SOLN
0.0000 [IU] | Freq: Three times a day (TID) | SUBCUTANEOUS | Status: DC
  Administered 2020-04-08: 2 [IU] via SUBCUTANEOUS
  Administered 2020-04-08 (×2): 3 [IU] via SUBCUTANEOUS
  Administered 2020-04-09: 5 [IU] via SUBCUTANEOUS
  Administered 2020-04-09: 3 [IU] via SUBCUTANEOUS
  Administered 2020-04-10 – 2020-04-11 (×3): 2 [IU] via SUBCUTANEOUS
  Administered 2020-04-11 (×2): 3 [IU] via SUBCUTANEOUS
  Administered 2020-04-12 (×2): 2 [IU] via SUBCUTANEOUS
  Administered 2020-04-13 (×2): 3 [IU] via SUBCUTANEOUS

## 2020-04-07 MED ORDER — POLYSACCHARIDE IRON COMPLEX 150 MG PO CAPS
150.0000 mg | ORAL_CAPSULE | Freq: Every day | ORAL | Status: DC
  Administered 2020-04-08 – 2020-04-13 (×6): 150 mg via ORAL
  Filled 2020-04-07 (×6): qty 1

## 2020-04-07 MED ORDER — SODIUM CHLORIDE 0.9 % IV SOLN: 2.0000 g | Freq: Four times a day (QID) | INTRAVENOUS | Status: DC

## 2020-04-07 MED ORDER — TRAZODONE HCL 50 MG PO TABS
50.0000 mg | ORAL_TABLET | Freq: Every evening | ORAL | Status: DC | PRN
  Administered 2020-04-07: 50 mg via ORAL
  Filled 2020-04-07: qty 1

## 2020-04-07 MED ORDER — LIDOCAINE HCL 1 % IJ SOLN
INTRAMUSCULAR | Status: AC
  Filled 2020-04-07: qty 20

## 2020-04-07 MED ORDER — CYCLOBENZAPRINE HCL 10 MG PO TABS: 10.0000 mg | ORAL_TABLET | Freq: Every evening | ORAL | Status: DC | PRN

## 2020-04-07 MED ORDER — OXYCODONE HCL 5 MG PO TABS: 5.0000 mg | ORAL_TABLET | ORAL | 0 refills | Status: DC | PRN

## 2020-04-07 MED ORDER — ONDANSETRON HCL 4 MG/2ML IJ SOLN: 4.0000 mg | Freq: Four times a day (QID) | INTRAMUSCULAR | Status: DC | PRN

## 2020-04-07 MED ORDER — MIDAZOLAM HCL 2 MG/2ML IJ SOLN
INTRAMUSCULAR | Status: AC
  Filled 2020-04-07: qty 2

## 2020-04-07 MED ORDER — INSULIN ASPART 100 UNIT/ML ~~LOC~~ SOLN: 0.0000 [IU] | Freq: Every day | SUBCUTANEOUS | 11 refills | Status: DC

## 2020-04-07 MED ORDER — INSULIN ASPART 100 UNIT/ML ~~LOC~~ SOLN: 0.0000 [IU] | Freq: Three times a day (TID) | SUBCUTANEOUS | 11 refills | Status: DC

## 2020-04-07 MED ORDER — ENSURE ENLIVE PO LIQD
237.0000 mL | Freq: Four times a day (QID) | ORAL | Status: DC
  Administered 2020-04-07 (×2): 237 mL via ORAL

## 2020-04-07 MED ORDER — SODIUM CHLORIDE 0.9 % IV SOLN
2.0000 g | Freq: Four times a day (QID) | INTRAVENOUS | Status: DC
  Administered 2020-04-07 – 2020-04-13 (×24): 2 g via INTRAVENOUS
  Filled 2020-04-07: qty 2000
  Filled 2020-04-07 (×2): qty 2
  Filled 2020-04-07 (×3): qty 2000
  Filled 2020-04-07: qty 2
  Filled 2020-04-07 (×3): qty 2000
  Filled 2020-04-07 (×3): qty 2
  Filled 2020-04-07: qty 2000
  Filled 2020-04-07 (×3): qty 2
  Filled 2020-04-07 (×3): qty 2000
  Filled 2020-04-07 (×5): qty 2
  Filled 2020-04-07: qty 2000

## 2020-04-07 MED ORDER — INSULIN GLARGINE 100 UNIT/ML ~~LOC~~ SOLN: 10.0000 [IU] | Freq: Two times a day (BID) | SUBCUTANEOUS | 11 refills | Status: DC

## 2020-04-07 MED ORDER — FENTANYL CITRATE (PF) 100 MCG/2ML IJ SOLN
INTRAMUSCULAR | Status: AC
  Filled 2020-04-07: qty 2

## 2020-04-07 MED ORDER — POLYSACCHARIDE IRON COMPLEX 150 MG PO CAPS: 150.0000 mg | ORAL_CAPSULE | Freq: Every day | ORAL | Status: DC

## 2020-04-07 MED ORDER — ACETAMINOPHEN 325 MG PO TABS
650.0000 mg | ORAL_TABLET | Freq: Four times a day (QID) | ORAL | Status: DC | PRN
  Administered 2020-04-07 – 2020-04-12 (×5): 650 mg via ORAL
  Filled 2020-04-07 (×5): qty 2

## 2020-04-07 MED ORDER — INSULIN ASPART 100 UNIT/ML ~~LOC~~ SOLN
3.0000 [IU] | Freq: Three times a day (TID) | SUBCUTANEOUS | Status: DC
  Administered 2020-04-08 – 2020-04-13 (×16): 3 [IU] via SUBCUTANEOUS

## 2020-04-07 MED ORDER — SENNA 8.6 MG PO TABS: 1.0000 | ORAL_TABLET | Freq: Every day | ORAL | 0 refills | Status: DC

## 2020-04-07 MED ORDER — LEVOTHYROXINE SODIUM 25 MCG PO TABS
25.0000 ug | ORAL_TABLET | Freq: Every day | ORAL | Status: DC
  Administered 2020-04-08 – 2020-04-13 (×6): 25 ug via ORAL
  Filled 2020-04-07 (×6): qty 1

## 2020-04-07 MED ORDER — MIDAZOLAM HCL 2 MG/2ML IJ SOLN
INTRAMUSCULAR | Status: AC | PRN
  Administered 2020-04-07 (×2): 1 mg via INTRAVENOUS

## 2020-04-07 MED ORDER — BUPIVACAINE HCL (PF) 0.5 % IJ SOLN
INTRAMUSCULAR | Status: AC
  Administered 2020-04-07: 10 mL
  Filled 2020-04-07: qty 30

## 2020-04-07 MED ORDER — DOCUSATE SODIUM 100 MG PO CAPS
100.0000 mg | ORAL_CAPSULE | Freq: Two times a day (BID) | ORAL | Status: DC
  Administered 2020-04-07 – 2020-04-13 (×11): 100 mg via ORAL
  Filled 2020-04-07 (×12): qty 1

## 2020-04-07 MED ORDER — INSULIN GLARGINE 100 UNIT/ML ~~LOC~~ SOLN
10.0000 [IU] | Freq: Two times a day (BID) | SUBCUTANEOUS | Status: DC
  Administered 2020-04-07 – 2020-04-13 (×12): 10 [IU] via SUBCUTANEOUS
  Filled 2020-04-07 (×14): qty 0.1

## 2020-04-07 MED ORDER — SORBITOL 70 % SOLN
30.0000 mL | Freq: Every day | Status: DC | PRN
  Administered 2020-04-11: 30 mL via ORAL
  Filled 2020-04-07 (×2): qty 30

## 2020-04-07 MED ORDER — TRAZODONE HCL 50 MG PO TABS: 50.0000 mg | ORAL_TABLET | Freq: Every evening | ORAL | Status: DC | PRN

## 2020-04-07 MED ORDER — DOCUSATE SODIUM 100 MG PO CAPS: 100.0000 mg | ORAL_CAPSULE | Freq: Two times a day (BID) | ORAL | 0 refills | Status: DC

## 2020-04-07 MED ORDER — APIXABAN 5 MG PO TABS
5.0000 mg | ORAL_TABLET | Freq: Two times a day (BID) | ORAL | Status: DC
  Administered 2020-04-07 – 2020-04-13 (×12): 5 mg via ORAL
  Filled 2020-04-07 (×12): qty 1

## 2020-04-07 MED ORDER — ONDANSETRON HCL 4 MG PO TABS: 4.0000 mg | ORAL_TABLET | Freq: Four times a day (QID) | ORAL | Status: DC | PRN

## 2020-04-07 MED ORDER — BISACODYL 10 MG RE SUPP
10.0000 mg | Freq: Every day | RECTAL | Status: DC | PRN
  Administered 2020-04-12: 10 mg via RECTAL
  Filled 2020-04-07: qty 1

## 2020-04-07 MED ORDER — OXYCODONE HCL 5 MG PO TABS
5.0000 mg | ORAL_TABLET | ORAL | Status: DC | PRN
  Administered 2020-04-07: 5 mg via ORAL
  Administered 2020-04-08 – 2020-04-10 (×7): 10 mg via ORAL
  Filled 2020-04-07 (×2): qty 2
  Filled 2020-04-07: qty 1
  Filled 2020-04-07 (×5): qty 2

## 2020-04-07 MED ORDER — LIDOCAINE HCL (PF) 1 % IJ SOLN
INTRAMUSCULAR | Status: AC | PRN
  Administered 2020-04-07: 10 mL

## 2020-04-07 MED ORDER — FENTANYL CITRATE (PF) 100 MCG/2ML IJ SOLN
INTRAMUSCULAR | Status: AC | PRN
  Administered 2020-04-07 (×2): 25 ug via INTRAVENOUS

## 2020-04-07 MED ORDER — ENSURE ENLIVE PO LIQD: 237.0000 mL | Freq: Four times a day (QID) | ORAL | 12 refills | Status: DC

## 2020-04-07 MED ORDER — SENNA 8.6 MG PO TABS
1.0000 | ORAL_TABLET | Freq: Two times a day (BID) | ORAL | Status: DC
  Administered 2020-04-07 – 2020-04-13 (×11): 8.6 mg via ORAL
  Filled 2020-04-07 (×12): qty 1

## 2020-04-07 NOTE — Progress Notes (Signed)
PMR Admission Coordinator Pre-Admission Assessment   Patient: Johnathan Murray is an 69 y.o., male MRN: [id number] DOB: [birth year] Height: '5\' 10"'$  (177.8 cm) Weight: 82.5 kg   Insurance Information HMO:     PPO:      PCP:      IPA:      80/20:      OTHER:  PRIMARY: Medicare Part A only      Policy#: [id number]      Subscriber: pt CM Name:       Phone#:      Fax#:  Pre-Cert#: verified Civil engineer, contracting:  Benefits:  Phone #:      Name:  Eff. Date: Part A 04/21/17     Deduct: $1484      Out of Pocket Max: n/a      Life Max: n/a CIR: 100% per Medicare guidelines     SNF: 20 full days Outpatient:      Co-Pay:  Home Health: 100%      Co-Pay:  DME:      Co-Pay:  Providers: pt choice SECONDARY: [employer] of [place]      Policy#: [id number]     Phone#: [phone]   Financial Counselor:       Phone#:    The "Data Collection Information Summary" for patients in Inpatient Rehabilitation Facilities with attached "Privacy Act [place] Records" was provided and verbally reviewed with: Patient and Family   Emergency Contact Information         Contact Information     Name Relation Home Work Mobile    Woodlief,[name] Spouse [phone]   [phone]         Current Medical History  Patient Admitting Diagnosis: debility, bactremia sepsis   History of Present Illness: Johnathan Murray is a 69 year old right-handed male with history of diabetes mellitus, PAF maintained on amiodarone as well as Eliquis, hypertension, bioprosthetic aortic valve replacement 2017, lumbar laminectomy decompression 06/11/2019 as well as recent C3-4 anterior cervical discectomy with interbody fusion for cervical stenosis myelopathy 03/02/2020 per [name].  Presented 03/19/2020 with low-grade fever and weakness lower extremities.  Lactic acid 1.6, sodium 130, BUN 98, creatinine 6.40 with latest prior creatinine 1.63 02/24/2020, WBC 12,600, hemoglobin 10.4, CK 20, urinalysis showed RBCs of 21-50 WBC 21-50 protein around  100 bacteria rare hyaline cast, SARS coronavirus negative.  His LFTs were markedly elevated AST 415 ALT 429 total bilirubin 5.5.  CT abdomen pelvis as well as CT cervical spine unremarkable.  MRI cervical spine negative for infection.  Patient was hypotensive and received fluid bolus.  Placed on broad spectrum antibiotics for suspected sepsis.  MRI lumbar spine findings consistent with acute osteomyelitis discitis at L4-5.  Evidence for associated soft tissue infection cellulitis within the psoas musculature bilaterally with a few small superimposed abscesses.  Additional multilevel degenerative spondylosis most pronounced at L3-4.  Neurosurgery consulted no plan for surgical intervention and underwent aspiration of disc space L4-5 with results pending.   TEE showed no obvious vegetation and follow-up per cardiology services.  Orthopedic services consulted Dr. Mardelle Matte for bilateral knee pain with noted swelling of left knee.  X-ray showed diffuse degenerative changes as well as a large joint effusion.  Doppler showed ruptured Baker's cyst on left lower extremity.  Patient underwent left lower extremity joint aspiration 03/21/2020 and right lower extremity joint aspiration 03/22/2020 both showing intracellular calcium pyrophosphate crystals WBC was up to 15,000 culture grew rare E Faecalis.  Patient also did receive steroid  injection of both knees.  Underwent I&D bilateral knees 03/24/2020.  Infectious disease continues to follow currently maintained on intravenous ampicillin x7 to 8 weeks through 05/20/2020.  PICC line was placed 03/29/2020.  AKI on CKD stage III continues to improve multifactorial from bacteremia with latest creatinine 1.32.  Normocytic anemia/postoperative anemia patient was transfused 2 units of packed red blood cells latest hemoglobin 7.6.  Therapy evaluations completed and patient was admitted for a comprehensive rehab program.   Patient's medical record from [facility] has been reviewed by  the rehabilitation admission coordinator and physician.   Past Medical History      Past Medical History:  Diagnosis Date  . Abnormal LFTs (liver function tests) 10/31/2016  . Abnormal nuclear stress test 05/03/2018  . Acid reflux 02/15/2018    not at present  . Acute otitis externa of left ear 11/13/2019  . Acute suppurative otitis media of left ear without spontaneous rupture of tympanic membrane 11/13/2019  . Anemia      low iron  . Aortic stenosis 05/03/2018  . Arthritis 02/15/2018    Overview:  shoulder  . Cancer ([place])      skin cancer on left wrist  . Cervical myelopathy ([place]) 04/22/2019  . Conductive hearing loss of left ear 11/13/2019  . Coronary artery disease involving native coronary artery of native heart without angina pectoris 05/30/2016  . Coronary artery disease of native artery of native heart with stable angina pectoris ([place])      pt. denies  . Diabetes mellitus due to underlying condition with unspecified complications ([place]) 6/38/4665  . Dyslipidemia 05/30/2016  . Dyspnea      upon exertion,  as of 06/05/09 not having any recent chest pain  . Dysrhythmia      a fib  . Essential hypertension 05/30/2016  . Heart murmur      Has had an Aortic valve replacement  . Hyperactive gag reflex 02/15/2018  . Hypothyroidism    . Intermittent claudication ([place]) 10/31/2018  . Lumbar stenosis with neurogenic claudication 06/11/2019  . Macular degeneration 02/15/2018  . PAF (paroxysmal atrial fibrillation) ([place]) 05/30/2016  . Pre-operative cardiovascular examination 01/03/2020  . Preoperative cardiovascular examination 04/17/2019  . Pseudogout of left knee 03/21/2020  . Renal insufficiency 01/17/2020  . S/P aortic valve replacement 01/17/2020  . Tinnitus of left ear 11/13/2019      Family History   family history includes Brain cancer in his mother; Heart disease in his maternal grandmother; Prostate cancer in his brother.   Prior Rehab/Hospitalizations Has the patient had prior  rehab or hospitalizations prior to admission? Yes   Has the patient had major surgery during 100 days prior to admission? Yes              Current Medications   Current Facility-Administered Medications:  .  0.9 %  sodium chloride infusion, , Intravenous, PRN, Donne Hazel, MD, Last Rate: 10 mL/hr at 04/02/20 2230, 250 mL at 04/02/20 2230 .  acetaminophen (TYLENOL) tablet 650 mg, 650 mg, Oral, Q6H PRN, Donne Hazel, MD, 650 mg at 04/06/20 1738 .  ampicillin (OMNIPEN) 2 g in sodium chloride 0.9 % 100 mL IVPB, 2 g, Intravenous, Q6H, Alma Friendly, MD, Last Rate: 300 mL/hr at 04/07/20 0945, 2 g at 04/07/20 0945 .  bisacodyl (DULCOLAX) suppository 10 mg, 10 mg, Rectal, Daily PRN, Merlene Pulling K, PA-C .  Chlorhexidine Gluconate Cloth 2 % PADS 6 each, 6 each, Topical, Daily, Marchia Bond, MD, 6 each  at 04/07/20 0941 .  cyclobenzaprine (FLEXERIL) tablet 10 mg, 10 mg, Oral, QHS PRN, Opyd, Ilene Qua, MD, 10 mg at 04/06/20 2132 .  diphenhydrAMINE (BENADRYL) 12.5 MG/5ML elixir 12.5-25 mg, 12.5-25 mg, Oral, Q4H PRN, Owens Shark, Blaine K, PA-C .  docusate sodium (COLACE) capsule 100 mg, 100 mg, Oral, BID, Merlene Pulling K, PA-C, 100 mg at 04/07/20 0940 .  feeding supplement (ENSURE ENLIVE) (ENSURE ENLIVE) liquid 237 mL, 237 mL, Oral, QID, Horris Latino, Adline Peals, MD .  fentaNYL (SUBLIMAZE) 100 MCG/2ML injection, , , ,  .  hydrALAZINE (APRESOLINE) injection 10 mg, 10 mg, Intravenous, Q4H PRN, Owens Shark, Blaine K, PA-C .  insulin aspart (novoLOG) injection 0-15 Units, 0-15 Units, Subcutaneous, TID WC, Ventura Bruns, PA-C, 3 Units at 04/07/20 1143 .  insulin aspart (novoLOG) injection 0-5 Units, 0-5 Units, Subcutaneous, QHS, Ventura Bruns, PA-C, 2 Units at 04/06/20 2135 .  insulin aspart (novoLOG) injection 3 Units, 3 Units, Subcutaneous, TID WC, Ventura Bruns, PA-C, 3 Units at 04/07/20 1143 .  insulin glargine (LANTUS) injection 10 Units, 10 Units, Subcutaneous, BID, Ventura Bruns, PA-C, 10 Units at  04/07/20 0940 .  iron polysaccharides (NIFEREX) capsule 150 mg, 150 mg, Oral, Q breakfast, Merlene Pulling K, PA-C, 150 mg at 04/07/20 0940 .  levothyroxine (SYNTHROID) tablet 25 mcg, 25 mcg, Oral, Q0600, Ventura Bruns, PA-C, 25 mcg at 04/07/20 0940 .  lidocaine (XYLOCAINE) 1 % (with pres) injection, , , ,  .  magnesium citrate solution 1 Bottle, 1 Bottle, Oral, Once PRN, Owens Shark, Blaine K, PA-C .  metoCLOPramide (REGLAN) tablet 5-10 mg, 5-10 mg, Oral, Q8H PRN **OR** metoCLOPramide (REGLAN) injection 5-10 mg, 5-10 mg, Intravenous, Q8H PRN, Owens Shark, Blaine K, PA-C .  metoprolol tartrate (LOPRESSOR) injection 5 mg, 5 mg, Intravenous, Once PRN, Owens Shark, Blaine K, PA-C .  midazolam (VERSED) 2 MG/2ML injection, , , ,  .  ondansetron (ZOFRAN) tablet 4 mg, 4 mg, Oral, Q6H PRN **OR** ondansetron (ZOFRAN) injection 4 mg, 4 mg, Intravenous, Q6H PRN, Owens Shark, Blaine K, PA-C .  oxyCODONE (Oxy IR/ROXICODONE) immediate release tablet 5-10 mg, 5-10 mg, Oral, Q4H PRN, Alma Friendly, MD, 10 mg at 04/07/20 1106 .  senna (SENOKOT) tablet 8.6 mg, 1 tablet, Oral, BID, Merlene Pulling K, PA-C, 8.6 mg at 04/07/20 0940 .  sodium chloride flush (NS) 0.9 % injection 10-40 mL, 10-40 mL, Intracatheter, Q12H, Marchia Bond, MD .  sodium chloride flush (NS) 0.9 % injection 10-40 mL, 10-40 mL, Intracatheter, PRN, Marchia Bond, MD .  traZODone (DESYREL) tablet 50 mg, 50 mg, Oral, QHS PRN, Alma Friendly, MD, 50 mg at 04/06/20 2132   Patients Current Diet:     Diet Order                      Diet Carb Modified Fluid consistency: Thin; Room service appropriate? Yes  Diet effective now                   Precautions / Restrictions Precautions Precautions: Fall, Cervical Precaution Comments: ACDF on 4/12, pt reports they d/c-ed collar 1 week after surgery Spinal Brace: Lumbar corset Other Brace: for comfort Restrictions Weight Bearing Restrictions: No    Has the patient had 2 or more falls or a fall with injury  in the past year? No   Prior Activity Level Community (5-7x/wk): up until ACDF in April was completely independent, no DME used at baseline, but recently using RW due to poor activity tolerance following  sx   Prior Functional Level Self Care: Did the patient need help bathing, dressing, using the toilet or eating? Independent   Indoor Mobility: Did the patient need assistance with walking from room to room (with or without device)? Independent   Stairs: Did the patient need assistance with internal or external stairs (with or without device)? Independent   Functional Cognition: Did the patient need help planning regular tasks such as shopping or remembering to take medications? Independent   Home Assistive Devices / Equipment Home Assistive Devices/Equipment: Eyeglasses, Dentures (specify type) Home Equipment: Grab bars - tub/shower, Shower seat - built in, [place] - single point, Environmental consultant - 2 wheels   Prior Device Use: Indicate devices/aids used by the patient prior to current illness, exacerbation or injury? Walker and cane used following recent sx, but not prior to that   Current Functional Level Cognition   Overall Cognitive Status: Within Functional Limits for tasks assessed Orientation Level: Oriented to person, Oriented to place( April 2001, unsure why here, knew he was @ clinic/hosp) Safety/Judgement: Decreased awareness of safety, Decreased awareness of deficits General Comments: Generally unhappy with his progress    Extremity Assessment (includes Sensation/Coordination)   Upper Extremity Assessment: Generalized weakness  Lower Extremity Assessment: Defer to PT evaluation RLE Deficits / Details: formal MMT not assessed due to pt pain presentation, able to perform supine knee flexion to 90*, seated knee extension lacking 10* extension LLE Deficits / Details: formal MMT not assessed due to pt pain presentation, able to perform supine knee flexion to 45* VERY limited by pain, seated  knee extension lacking 10* extension     ADLs   Overall ADL's : Needs assistance/impaired Eating/Feeding: Independent Grooming: Wash/dry hands, Wash/dry face, Set up, Sitting, With caregiver independent assisting Upper Body Bathing: Set up, Supervision/ safety, Sitting, With caregiver independent assisting Lower Body Bathing: Maximal assistance Upper Body Dressing : Set up, Supervision/safety, Sitting, With caregiver independent assisting Lower Body Dressing: Minimal assistance, Sitting/lateral leans Lower Body Dressing Details (indicate cue type and reason): Pt able to don socks seated in recliner, flexing L knee up to chair Toilet Transfer: Minimal assistance, +2 for physical assistance, Cueing for safety, Cueing for sequencing, Ambulation, RW Toilet Transfer Details (indicate cue type and reason): simulated to recliner. Pt couldn't quite make it ambulating towards bathroom with RW due to B UEs fatiguing Toileting- Clothing Manipulation and Hygiene: Minimal assistance, Sit to/from stand Functional mobility during ADLs: Minimal assistance, +2 for physical assistance, Rolling walker, Cueing for safety, Cueing for sequencing General ADL Comments: Provided UE theraband and education, discussed CIR     Mobility   Overal bed mobility: Needs Assistance Bed Mobility: Sit to Sidelying Sidelying to sit: Mod assist Supine to sit: Min guard, HOB elevated Sit to supine: Supervision, HOB elevated Sit to sidelying: Mod assist General bed mobility comments: Mod assist to help LEs onto bed     Transfers   Overall transfer level: Needs assistance Equipment used: Rolling walker (2 wheeled) Transfers: Sit to/from Stand Sit to Stand: +2 physical assistance, Mod assist  Lateral/Scoot Transfers: Min assist, +2 physical assistance, +2 safety/equipment General transfer comment: stood from recliner with bilateral support; slow rise, painful     Ambulation / Gait / Stairs / Wheelchair Mobility    Ambulation/Gait Ambulation/Gait assistance: Min assist, +2 safety/equipment Gait Distance (Feet): (pivotal steps recliner back to bed) Assistive device: Rolling walker (2 wheeled) Gait Pattern/deviations: Decreased step length - right, Decreased step length - left, Trunk flexed General Gait Details: Slow, small steps  to turn to get to bed Gait velocity: significantly decreased     Posture / Balance Dynamic Sitting Balance Sitting balance - Comments: Perform bilateral UE exercises while seated  Balance Overall balance assessment: Needs assistance Sitting-balance support: Feet supported, No upper extremity supported Sitting balance-Leahy Scale: Fair Sitting balance - Comments: Perform bilateral UE exercises while seated  Standing balance support: During functional activity, Bilateral upper extremity supported Standing balance-Leahy Scale: Poor Standing balance comment: heavy reliance on RW with BUEs.      Special needs/care consideration Diabetic management yes and Designated visitor [place] (from acute therapy documentation) Living Arrangements: Spouse/significant other Available Help at Discharge: Family, Available 24 hours/day Type of Home: House Home Layout: One level Home Access: Stairs to enter Entrance Stairs-Rails: Can reach both Entrance Stairs-Number of Steps: 7 Bathroom Shower/Tub: Multimedia programmer: [place]: No   Discharge Living Setting Plans for Discharge Living Setting: Patient's home Type of Home at Discharge: [place]: One level Discharge Home Access: Stairs to enter Entrance Stairs-Rails: Right, Left Entrance Stairs-Number of Steps: 7 Discharge Bathroom Shower/Tub: Walk-in shower, Tub only Discharge Bathroom Toilet: Standard Discharge [place] Accessibility: Yes How Accessible: Accessible via walker Does the patient have any problems obtaining your medications?: No    Social/Family/Support Systems Patient Roles: Spouse Anticipated Caregiver: spouse [place] Anticipated Ambulance person Information: [name] [phone] Ability/Limitations of Caregiver: supervision to min assist, will take FMLA but will eventually have to return to work Caregiver Availability: 24/7 Discharge Plan Discussed with Primary Caregiver: Yes Is Caregiver In Agreement with Plan?: Yes Does Caregiver/Family have Issues with Lodging/Transportation while Pt is in Rehab?: No   Goals Patient/Family Goal for Rehab: PT/OT supervision to mod I, SLP n/a Expected length of stay: 12-14 days Pt/Family Agrees to Admission and willing to participate: Yes Program Orientation Provided & Reviewed with Pt/Caregiver Including Roles  & Responsibilities: Yes   Decrease burden of Care through IP rehab admission: n/a   Possible need for SNF placement upon discharge: Not anticipated   Patient Condition: I have reviewed medical records from [facility], spoken with CM, and patient and spouse. I met with patient at the bedside for inpatient rehabilitation assessment.  Patient will benefit from ongoing PT and OT, can actively participate in 3 hours of therapy a day 5 days of the week, and can make measurable gains during the admission.  Patient will also benefit from the coordinated team approach during an Inpatient Acute Rehabilitation admission.  The patient will receive intensive therapy as well as Rehabilitation physician, nursing, social worker, and care management interventions.  Due to safety, disease management, medication administration, pain management and patient education the patient requires 24 hour a day rehabilitation nursing.  The patient is currently min to mod assist with mobility and basic ADLs.  Discharge setting and therapy post discharge at home with home health is anticipated.  Patient has agreed to participate in the Acute Inpatient Rehabilitation Program and will admit today.    Preadmission Screen Completed By:  Michel Santee PT, DPT, 04/07/2020 1:01 PM ______________________________________________________________________   Discussed status with Dr. Dagoberto Ligas on 04/07/20  at 1:02 PM  and received approval for admission today.   Admission Coordinator:  Michel Santee, PT, DPT time 1:02 PM Sudie Grumbling 04/07/20     Assessment/Plan: Diagnosis: debility, septic knees, osteomyelitis lumbar spine 1. Does the need for close, 24 hr/day Medical supervision in concert with the patient's rehab needs make it unreasonable for  this patient to be served in a less intensive setting? Yes 2. Co-Morbidities requiring supervision/potential complications: PAF, DM, htn, prior lumbar lami, ACDF 3. Due to bladder management, bowel management, safety, skin/wound care, disease management, medication administration, pain management and patient education, does the patient require 24 hr/day rehab nursing? Yes 4. Does the patient require coordinated care of a physician, rehab nurse, PT, OT, and SLP to address physical and functional deficits in the context of the above medical diagnosis(es)? Yes Addressing deficits in the following areas: balance, endurance, locomotion, strength, transferring, bowel/bladder control, bathing, dressing, feeding, grooming, toileting and psychosocial support 5. Can the patient actively participate in an intensive therapy program of at least 3 hrs of therapy 5 days a week? Yes 6. The potential for patient to make measurable gains while on inpatient rehab is excellent 7. Anticipated functional outcomes upon discharge from inpatient rehab: modified independent and supervision PT, modified independent and supervision OT, n/a SLP 8. Estimated rehab length of stay to reach the above functional goals is: 12-14 days 9. Anticipated discharge destination: Home 10. Overall Rehab/Functional Prognosis: excellent     MD Signature: Meredith Staggers, MD, [facility] 04/07/2020

## 2020-04-07 NOTE — Procedures (Signed)
INTERVENTIONAL NEURORADIOLOGY BRIEF POSTPROCEDURE NOTE  Attending: Dr. Pedro Earls  Assistant: None  Diagnosis: L4-5 discitis/osteomyelitis  Access site: Percutaneous  Anesthesia: Moderate sedation  Medication used: 2 mg Versed IV; 50 mcg Fentanyl IV.  Complications: None  Estimated blood loss: None  Specimen: 2 syringes with disc aspirate sent to lab for microbiology analysis.  Findings: Endplate irregularities. Serosanguineous disc samples.  The patient tolerated the procedure well without incident or complication and is in stable condition.

## 2020-04-07 NOTE — Discharge Summary (Signed)
Discharge Summary  Johnathan Murray MLY:[id number] DOB: 01/03/1951  PCP: Nicoletta Dress, MD  Admit date: 03/19/2020 Discharge date: 04/07/2020  Time spent: 45 mins  Recommendations for Outpatient Follow-up:  1. PCP upon discharge 2. ID will continue to follow intermittently 3. Further discharge per rehab physicians  Discharge Diagnoses:  Active Hospital Problems   Diagnosis Date Noted  . Enterococcal bacteremia 03/21/2020  . Protein-calorie malnutrition, severe 03/24/2020  . Septic arthritis ([place]) 03/23/2020  . History of fusion of cervical spine 03/23/2020  . Pseudogout of left knee 03/21/2020  . ARF (acute renal failure) ([place]) 03/20/2020  . S/P aortic valve replacement 01/17/2020  . Cervical myelopathy ([place]) 04/22/2019  . Abnormal LFTs (liver function tests) 10/31/2016  . Essential hypertension 05/30/2016  . PAF (paroxysmal atrial fibrillation) ([place]) 05/30/2016    Resolved Hospital Problems  No resolved problems to display.    Discharge Condition: Stable  Diet recommendation: Moderate carb  Vitals:   04/07/20 0915 04/07/20 0941  BP: 139/81 137/78  Pulse: (!) 104 99  Resp: (!) 21 18  Temp:  98.3 F (36.8 C)  SpO2: 100% 100%    History of present illness:  69 y.o.malewithhistory of paroxysmal atrial fibrillation, hypertension, bioprosthetic aortic valve replacement who had undergone recent cervical discectomy and fusion about 3 weeks ago done by Dr. Trenton Gammon has been feeling weak with increasing pain in the neck and low back and poor appetite. Denies any vomiting or diarrhea denies taking any Tylenol but does take NSAIDs sometimes. Has not noticed any blood in the urine and has been urinating less last few days. Has subjective feeling of fever chills. Given the symptoms patient presents to the ER. Patient states over the last 3 weeks and surgery patient has been finding it difficult to ambulate because of the weakness of the lower extremity. Denies any  incontinence of urine or bowel. Has been having more lower extremity edema more on the left side. In the ER on exam patient appears generally weak with weakness of the lower extremity. There is edema of the lower extremity on the left side. Labs show creatinine of 6.4 which is increased from 1.6 about 3 weeks ago. In addition patient's LFTs are markedly elevated at AST of 415 and ALT of 429 patient's albumin is 2.6 total bilirubin 5.5 bicarb 19 sodium 130 calcium 10.2 hemoglobin 10.4 lactic acid 1.6 WBC 12.6 platelets 341 urinalysis shows RBCs of 21-50 WBC 21-50 protein around 100 bacteria rare hyaline cast present mucus present. Patient was given fluid bolus and admitted for acute renal failure. Patient also had CT abdomen pelvis and CT cervical spine which did not show any acute. USS of the abdomen also was unremarkable. Covid test is negative.  Patient admitted for further management.     Today, saw patient after IR procedure, denies any new complaints.  Just reported he wants to take a nap.  Stable to be transferred to CIR for aggressive rehab needs.     Hospital Course:  Principal Problem:   Enterococcal bacteremia Active Problems:   Essential hypertension   PAF (paroxysmal atrial fibrillation) (HCC)   Abnormal LFTs (liver function tests)   Cervical myelopathy (HCC)   S/P aortic valve replacement   ARF (acute renal failure) (HCC)   Pseudogout of left knee   Septic arthritis (HCC)   History of fusion of cervical spine   Protein-calorie malnutrition, severe   Sepsis likely 2/2 Enterococcus faecalis bacteremia Source from possible knee infection vs recent postoperative cervical/lumbar infection BC x2  growing Enterococcus faecalis, repeat BC x2NGTD UC no growth, Chest x-ray unremarkable TTE showed no obvious vegetation but increased transaortic gradients across the bioprosthetic valve  Cardiology was consulted for TEE. Pt underwent TEE 5/6. Valves unremarkable, no vegetation  seen ID recs noted, IV ampicillin will be lengthened to another 5 to 6 weeks for total of 7 to 8 weeks total from negative blood cultures through June 30th Follow-up with ID PICC placed 5/9  Acute osteomyelitis discitis of L4-L5  MRI showed above with possible epidural abscess ESR 118, CRP 12.4, both elevated Neurosurgery consulted, recommend disc aspiration by IR.  No indication for surgical intervention at present.  Recommend lumbosacral corset when out of bed for comfort IR consulted, s/p disc aspiration on 04/07/2020, await final cultures Continue IV antibiotics as above   Likely LLE septic knee with ruptured Baker's cyst with possibly pseudogout Doppler showed ruptured Baker's cyst on LLE Left knee x-ray showed large joint effusion, septic joint cannot be excluded Orthopedics consulted s/p LLE joint aspirationon 03/21/2020 and RLE joint aspiration on 03/22/2020 both showed positive intracellular calcium pyrophosphate crystals, WBC over 15,000, culture grew rare E faecalis from LLE, culture from RLE without organisms Status post steroid injection in both knees on 03/22/2020 by orthopedics Now s/p I&D of bilateral knees on 03/24/2020, stable at this time  AKIon CKD stage III/anion gap metabolic acidosis Stable, back to baseline NSAID, Lasix and lisinopril use, all on hold Baseline creatinine 1.3-1.6, on presentation 6.40 Held patient's Lasix, lisinopril CT abdomen does not show any obstruction Nephrology consulted earlier, since signed off  Hyperglycemia Improved CBGs have been stable Likely 2/2 recent steroid injection in the knee Continue with SSI, Accu-Cheks, hypoglycemic protocol Metformin on hold, may restart in a few days once no intervention is scheduled/planned  Transaminitis Improved Suspect secondary to above sepsis/hypotension/shock liver INR WNL Acute hepatitis panel nonreactive Home amiodarone currently on hold for now, may repeat CMP close to discharge to see if  LFTs have trended back down to normal  Paroxysmal A. Fib/bioprosthetic aortic valve replacement Heart rate WNL Holding home amiodarone TTE as noted above Continue Eliquis  Neck pain History of recent cervical decompression surgery Imaging does not show anything acute as mentioned above MRI cervical spine with no evidence of infection  Normocytic anemia/postop anemia Anemia panel iron 43, sats 29, ferritin 3,811 Continued on oral iron supplementation Hgb down to 6.8 recently s/p 2 units PRBC's  Hypothyroidism TSH levels WNL Continue Synthroid          Malnutrition Type:  Nutrition Problem: Severe Malnutrition Etiology: acute illness   Malnutrition Characteristics:  Signs/Symptoms: energy intake < or equal to 50% for > or equal to 5 days, percent weight loss(6% weight loss within 1 month) Percent weight loss: 6 %   Nutrition Interventions:  Interventions: Ensure Enlive (each supplement provides 350kcal and 20 grams of protein)   Estimated body mass index is 26.1 kg/m as calculated from the following:   Height as of this encounter: '5\' 10"'$  (1.778 m).   Weight as of this encounter: 82.5 kg.    Procedures:  TEE 5/6  Consultations:  ID  Orthopedic Surgery  Critical Care  Nephrology  Neurosurgery  IR  Discharge Exam: BP 137/78 (BP Location: Left Arm)   Pulse 99   Temp 98.3 F (36.8 C) (Oral)   Resp 18   Ht '5\' 10"'$  (1.778 m)   Wt 82.5 kg   SpO2 100%   BMI 26.10 kg/m   General: NAD Cardiovascular: S1, S2  present Respiratory: CTAB  Discharge Instructions You were cared for by a hospitalist during your hospital stay. If you have any questions about your discharge medications or the care you received while you were in the hospital after you are discharged, you can call the unit and asked to speak with the hospitalist on call if the hospitalist that took care of you is not available. Once you are discharged, your primary care physician will  handle any further medical issues. Please note that NO REFILLS for any discharge medications will be authorized once you are discharged, as it is imperative that you return to your primary care physician (or establish a relationship with a primary care physician if you do not have one) for your aftercare needs so that they can reassess your need for medications and monitor your lab values.  Discharge Instructions    Diet - low sodium heart healthy   Complete by: As directed    Increase activity slowly   Complete by: As directed      Allergies as of 04/07/2020      Reactions   Atorvastatin Other (See Comments)   Urinary retention    Nebivolol Diarrhea      Medication List    STOP taking these medications   amiodarone 400 MG tablet Commonly known as: PACERONE   benazepril 40 MG tablet Commonly known as: LOTENSIN   cyclobenzaprine 10 MG tablet Commonly known as: FLEXERIL   ezetimibe 10 MG tablet Commonly known as: ZETIA   furosemide 40 MG tablet Commonly known as: LASIX   metFORMIN 500 MG tablet Commonly known as: GLUCOPHAGE     TAKE these medications   amLODipine 10 MG tablet Commonly known as: NORVASC Take 10 mg by mouth at bedtime.   ampicillin 2 g in sodium chloride 0.9 % 100 mL Inject 2 g into the vein every 6 (six) hours.   apixaban 5 MG Tabs tablet Commonly known as: Eliquis Take 1 tablet (5 mg total) by mouth 2 (two) times daily.   CoQ10 200 MG Caps Take 200 mg by mouth daily.   docusate sodium 100 MG capsule Commonly known as: COLACE Take 1 capsule (100 mg total) by mouth 2 (two) times daily.   feeding supplement (ENSURE ENLIVE) Liqd Take 237 mLs by mouth 4 (four) times daily.   insulin aspart 100 UNIT/ML injection Commonly known as: novoLOG Inject 0-5 Units into the skin at bedtime.   insulin aspart 100 UNIT/ML injection Commonly known as: novoLOG Inject 0-15 Units into the skin 3 (three) times daily with meals.   insulin glargine 100 UNIT/ML  injection Commonly known as: LANTUS Inject 0.1 mLs (10 Units total) into the skin 2 (two) times daily.   iron polysaccharides 150 MG capsule Commonly known as: NIFEREX Take 1 capsule (150 mg total) by mouth daily with breakfast. Start taking on: Apr 08, 2020   levothyroxine 25 MCG tablet Commonly known as: SYNTHROID Take 25 mcg by mouth daily before breakfast.   metoprolol tartrate 50 MG tablet Commonly known as: LOPRESSOR TAKE 1 TABLET (50 MG TOTAL) BY MOUTH DAILY AS NEEDED. What changed:   when to take this  reasons to take this   nitroGLYCERIN 0.4 MG SL tablet Commonly known as: NITROSTAT Place 1 tablet (0.4 mg total) under the tongue every 5 (five) minutes as needed for chest pain.   oxyCODONE 5 MG immediate release tablet Commonly known as: Oxy IR/ROXICODONE Take 1-2 tablets (5-10 mg total) by mouth every 4 (four) hours as needed  for moderate pain or severe pain.   senna 8.6 MG Tabs tablet Commonly known as: SENOKOT Take 1 tablet (8.6 mg total) by mouth at bedtime.   traZODone 50 MG tablet Commonly known as: DESYREL Take 1 tablet (50 mg total) by mouth at bedtime as needed for sleep.   vitamin B-12 1000 MCG tablet Commonly known as: CYANOCOBALAMIN Take 1,000 mcg by mouth daily.   vitamin C 500 MG tablet Commonly known as: ASCORBIC ACID Take 500 mg by mouth daily.   zinc gluconate 50 MG tablet Take 50 mg by mouth daily.            Durable Medical Equipment  (From admission, onward)         Start     Ordered   03/24/20 2004  DME Walker rolling  Once    Question Answer Comment  Walker: With [place] Wheels   Patient needs a walker to treat with the following condition Septic arthritis ([place])      03/24/20 2003         Allergies  Allergen Reactions  . Atorvastatin Other (See Comments)    Urinary retention   . Nebivolol Diarrhea   Follow-up Information    Marchia Bond, MD. Schedule an appointment as soon as possible for a visit in 2 weeks.     Specialty: Orthopedic Surgery Contact information: [address] [place] [address] [phone]        Michel Bickers, MD Follow up.   Specialty: Infectious Diseases Why: 5/27 at 10:30am. Please call to reschedule if you are not able to make appointment.  Contact information: [address] [place] [address] [phone]            The results of significant diagnostics from this hospitalization (including imaging, microbiology, ancillary and laboratory) are listed below for reference.    Significant Diagnostic Studies: CT ABDOMEN PELVIS WO CONTRAST  Result Date: 03/19/2020 CLINICAL DATA:  Generalized abdominal pain EXAM: CT ABDOMEN AND PELVIS WITHOUT CONTRAST TECHNIQUE: Multidetector CT imaging of the abdomen and pelvis was performed following the standard protocol without IV contrast. COMPARISON:  None. FINDINGS: Lower chest: Prosthetic aortic valve is seen. No pericardial fluid/thickening. No hiatal hernia. The visualized portions of the lungs are clear. Hepatobiliary: Although limited due to the lack of intravenous contrast, normal in appearance without gross focal abnormality. Scattered punctate calcifications are seen. No evidence of calcified gallstones or biliary ductal dilatation. Pancreas:  Unremarkable.  No surrounding inflammatory changes. Spleen: Normal in size. Although limited due to the lack of intravenous contrast, normal in appearance. Adrenals/Urinary Tract: Both adrenal glands appear normal. Mild bilateral perinephric stranding is seen, likely due to chronic medical renal disease. No renal or collecting system calculi. No hydronephrosis. Bladder is unremarkable. Stomach/Bowel: The stomach and small bowel are normal in appearance. There is a moderate amount of colonic stool. Scattered colonic diverticula are noted without diverticulitis. Vascular/Lymphatic: There are no enlarged abdominal or pelvic lymph nodes. Scattered aortic  atherosclerotic calcifications are seen without aneurysmal dilatation. Reproductive: The prostate is unremarkable. Other: No evidence of abdominal wall mass or hernia. Musculoskeletal: No acute or significant osseous findings. IMPRESSION: No renal or collecting system calculi. Colonic diverticulosis without diverticulitis. Moderate amount of colonic stool. Aortic Atherosclerosis (ICD10-I70.0). No other acute intra-abdominal or pelvic pathology to explain the patient's symptoms. Electronically Signed   By: Prudencio Pair M.D.   On: 03/19/2020 23:14   CT Cervical Spine Wo Contrast  Result Date: 03/19/2020  CLINICAL DATA:  Recent cervical surgery with neck pain EXAM: CT CERVICAL SPINE WITHOUT CONTRAST TECHNIQUE: Multidetector CT imaging of the cervical spine was performed without intravenous contrast. Multiplanar CT image reconstructions were also generated. COMPARISON:  CT 12/27/2019 FINDINGS: Alignment: Mild straightening. No subluxation. Facet alignment is maintained. Skull base and vertebrae: No acute fracture. No primary bone lesion or focal pathologic process. Soft tissues and spinal canal: No prevertebral fluid or swelling. No visible canal hematoma. Disc levels: Interval removal of fixating plate and screws from C4 through C6 with hardware tracks present in C5 and C6 vertebral bodies. Intervertebral disc devices are present at C4-C5 and C5-C6. Interval placement of anterior plate and fixating screws in addition to interbody device at C3-C4. There appears to be resection of the posteroinferior aspect of C3 and superior posterior aspect of C4. Cut margins appear smooth. Degenerative changes at C6-C7 and C7-T1. Upper chest: Carotid vascular calcifications. Lung apices are clear. Other: No gross retropharyngeal gas or fluid collection. IMPRESSION: 1. Interval removal of fixating plate and screws from C4 through C6 with interval placement of surgical plate, fixating screws and interbody device at C3-C4. No acute  osseous abnormality. Electronically Signed   By: Donavan Foil M.D.   On: 03/19/2020 23:19   MR CERVICAL SPINE WO CONTRAST  Result Date: 03/23/2020 CLINICAL DATA:  Enterococcal bacteremia and possible aortic valve endocarditis. History of prior cervical spine surgery, most recently 03/02/2020 when the patient underwent C3-4 ACDF. Severe neck pain. EXAM: MRI CERVICAL SPINE WITHOUT CONTRAST TECHNIQUE: Multiplanar, multisequence MR imaging of the cervical spine was performed. No intravenous contrast was administered. COMPARISON:  Cervical spine CT scan 03/19/2020. MRI cervical spine 10/28/2017. Postmyelogram CT scan 12/27/2019. FINDINGS: Alignment: Maintained. Vertebrae: Again seen is postoperative change of C3-6 fusion. Hardware at C4-5 and C5-6 has been removed. Anterior plate and screws are in place at C3-4. No evidence of discitis or osteomyelitis. No fracture or worrisome lesion. Cord: Patient motion somewhat limits evaluation. There is a punctate focus of signal abnormality in the left hemicord at C4-5 compatible with myelomalacia. There may be mild signal abnormality within the cord at C3-4. No epidural abscess. Posterior Fossa, vertebral arteries, paraspinal tissues: Negative. Disc levels: C2-3: Shallow disc osteophyte complex and right worse than left facet arthropathy. There is some ligamentum flavum thickening. Mild central canal and mild to moderate right foraminal stenosis. Left foramen open. No change. C3-4: Status post discectomy and fusion since the prior postmyelogram CT. There is severe facet degenerative disease on the left with marrow edema in the facets. Cord flattening seen on the prior examination appears resolved. The foramina appear open. Motion degrades evaluation. C4-5: Status post discectomy and fusion.  No stenosis. C5-6: Status post discectomy and fusion.  No stenosis. C6-7: Minimal bulge without stenosis. C7-T1: Shallow disc osteophyte complex and uncovertebral disease. No stenosis.  T1-2: Disc bulge without stenosis. This level is imaged in the sagittal plane only. IMPRESSION: Negative for evidence of infection. Status post C3-4 discectomy and fusion. Central canal stenosis seen on the preoperative examination is improved. Marked facet degenerative change on the left with associated marrow edema noted. Although motion somewhat limits evaluation, there appears to be a small focus of myelomalacia in the left cord at C4-5. Electronically Signed   By: Inge Rise M.D.   On: 03/23/2020 11:59   MR LUMBAR SPINE WO CONTRAST  Result Date: 04/02/2020 CLINICAL DATA:  For comparison initial evaluation for acute low back pain, recent surgery, infection suspected. EXAM: MRI LUMBAR SPINE WITHOUT CONTRAST  TECHNIQUE: Multiplanar, multisequence MR imaging of the lumbar spine was performed. No intravenous contrast was administered. COMPARISON:  None. FINDINGS: Segmentation: Standard. Lowest well-formed disc space labeled the L5-S1 level. Alignment: Physiologic with preservation of the normal lumbar lordosis. No listhesis. Vertebrae: Abnormal T2/STIR signal intensity seen throughout the L4-5 interspace with associated progressive disc space height loss as compared to previous. Associated abnormal marrow edema with mild endplate irregularity within the adjacent L4 and L5 vertebral bodies. Findings consistent with acute osteomyelitis discitis. Irregular heterogeneous T1/T2 signal intensity seen within the ventral epidural space at this level could reflect postoperative changes and/or epidural phlegmon/abscess (series 2, image 8). Mild reactive marrow edema with small joint effusion seen about the left greater than right L4-5 facets as well, which could also be related to infection and/or arthritic changes. No other evidence for acute infection within the lumbar spine. No evidence for acute or chronic fracture. Underlying bone marrow signal intensity mildly heterogeneous but within normal limits. No  worrisome osseous lesions. Conus medullaris and cauda equina: Conus extends to the L1 level. Conus and cauda equina appear normal. Paraspinal and other soft tissues: Soft tissue edema seen throughout the lower posterior paraspinous soft tissues, which could be related to postoperative changes and/or infection/myositis. Abnormal edema seen within the psoas musculature bilaterally as well, likely related to infection. Few small superimposed soft tissue collections present within both psoas muscles, largest of which measures approximately 11 mm on the right (series 5, image 29), consistent with small abscesses. Few small cyst noted within the kidneys bilaterally. Visualized visceral structures otherwise unremarkable. Disc levels: L1-2: Negative interspace. Mild facet hypertrophy. No canal or foraminal stenosis. L2-3: Mild diffuse disc bulge with disc desiccation. Disc bulge asymmetric to the right with associated mild reactive endplate changes. Moderate facet and ligament flavum hypertrophy. Resultant mild spinal stenosis. Foramina remain patent. L3-4: Mild disc bulge with disc desiccation. Superimposed broad right extraforaminal disc protrusion (series 5, image 19). Moderate facet and ligament flavum hypertrophy. Resultant moderate canal with bilateral subarticular stenosis. Mild bilateral L3 foraminal narrowing. L4-5: Findings concerning for osteomyelitis discitis as above. Sequelae of recent posterior decompression. Residual left greater than right facet degeneration. Persistent moderate spinal stenosis, improved as compared to preoperative exam. Moderate right with severe left L4 foraminal narrowing. L5-S1: Diffuse disc bulge with disc desiccation and mild intervertebral disc space narrowing. Mild reactive endplate changes. Moderate facet and ligament flavum hypertrophy. Resultant moderate bilateral subarticular stenosis. Moderate right worse than left L5 foraminal narrowing. IMPRESSION: 1. Findings consistent with  acute osteomyelitis discitis at L4-5. Heterogeneous signal intensity within the adjacent ventral epidural space could reflect epidural abscess and/or phlegmon or possibly disc material. Sequelae of recent posterior decompression at this level with persistent moderate spinal stenosis. 2. Evidence for associated soft tissue infection/cellulitis within the psoas musculature bilaterally with a few small superimposed abscesses as above. Edema throughout the posterior paraspinous musculature could reflect postoperative changes and/or myositis/infection. 3. Additional multilevel degenerative spondylosis as above, most pronounced at L3-4 where there is resultant moderate spinal stenosis, otherwise stable. Electronically Signed   By: Jeannine Boga M.D.   On: 04/02/2020 18:56   US Abdomen Limited  Result Date: 03/20/2020 CLINICAL DATA:  Right upper quadrant pain EXAM: ULTRASOUND ABDOMEN LIMITED RIGHT UPPER QUADRANT COMPARISON:  None. FINDINGS: Gallbladder: Partially decompressed gallbladder. No gallstones or wall thickening visualized. No sonographic Murphy sign noted by sonographer. Common bile duct: Diameter: 6 mm Liver: Increased echotexture seen throughout. No focal abnormality or biliary ductal dilatation. Portal vein is patent on  color Doppler imaging with normal direction of blood flow towards the liver. Other: None. IMPRESSION: Hepatic steatosis. Partially decompressed gallbladder otherwise unremarkable. Electronically Signed   By: Prudencio Pair M.D.   On: 03/20/2020 00:37   DG Chest Port 1 View  Result Date: 03/20/2020 CLINICAL DATA:  Acute kidney injury EXAM: PORTABLE CHEST 1 VIEW COMPARISON:  05/03/2018 chest radiograph. FINDINGS: Intact sternotomy wires. Cardiac valvular prosthesis and CABG clips noted. Stable cardiomediastinal silhouette with top-normal heart size. No pneumothorax. No pleural effusion. Lungs appear clear, with no acute consolidative airspace disease and no pulmonary edema.  IMPRESSION: No active disease. Electronically Signed   By: Ilona Sorrel M.D.   On: 03/20/2020 12:29   DG Knee Left Port  Result Date: 03/21/2020 CLINICAL DATA:  LEFT knee pain and swelling, question septic knee EXAM: PORTABLE LEFT KNEE - 1-2 VIEW COMPARISON:  Portable exam 1146 hours without priors for comparison FINDINGS: Osseous demineralization. Scattered joint space narrowing and chondrocalcinosis. Lateral subluxation of tibia. Large joint effusion. Anterior soft tissue swelling. No acute fracture, dislocation or bone destruction. IMPRESSION: Osseous demineralization with diffuse degenerative changes and question CPPD LEFT knee. Large joint effusion and mild anterior soft tissue swelling without definite acute osseous findings. Septic joint cannot be excluded radiographically; if this is a clinical concern, recommend joint aspiration. Electronically Signed   By: Lavonia Dana M.D.   On: 03/21/2020 13:20   ECHOCARDIOGRAM COMPLETE  Result Date: 03/22/2020    ECHOCARDIOGRAM REPORT   Patient Name:   NIKODEM LEADBETTER Bouillon Date of Exam: 03/22/2020 Medical Rec #:  [id number]           Height:       70.0 in Accession #:    [id number]          Weight:       180.6 lb Date of Birth:  05-26-1951           BSA:          1.999 m Patient Age:    81 years            BP:           117/57 mmHg Patient Gender: M                   HR:           86 bpm. Exam Location:  Inpatient Procedure: 2D Echo and Intracardiac Opacification Agent Indications:    Bacteremia 790.7 / R78.81  History:        Patient has prior history of Echocardiogram examinations, most                 recent 01/16/2020. CAD, 23 mm Edwards tissue valve,                 Arrythmias:Atrial Fibrillation; Risk Factors:Hypertension,                 Diabetes and Dyslipidemia.                 Aortic Valve: 23 mm Edwards-SAPIEN valve is present in the                 aortic position.  Sonographer:    Vikki Ports Turrentine Referring Phys: [place]  1.  There is no obvious vegetation, however transaortic gradients across the bioprosthetic valve are increased from the prior study on 01/16/2020, mean gradient 28 mmHg, previously 16 mmHg. Consider TEE for further evaluation.  2. Left  ventricular ejection fraction, by estimation, is 55 to 60%. The left ventricle has normal function. The left ventricle has no regional wall motion abnormalities. There is mild concentric left ventricular hypertrophy. Left ventricular diastolic parameters are consistent with Grade I diastolic dysfunction (impaired relaxation). Elevated left atrial pressure.  3. Right ventricular systolic function is normal. The right ventricular size is normal.  4. Left atrial size was mildly dilated.  5. The mitral valve is normal in structure. Mild mitral valve regurgitation. No evidence of mitral stenosis.  6. The aortic valve has been repaired/replaced. Aortic valve regurgitation is not visualized. No aortic stenosis is present. There is a 23 mm Edwards-SAPIEN valve present in the aortic position. Aortic valve mean gradient measures 28.0 mmHg.  7. The inferior vena cava is normal in size with greater than 50% respiratory variability, suggesting right atrial pressure of 3 mmHg. FINDINGS  Left Ventricle: Left ventricular ejection fraction, by estimation, is 55 to 60%. The left ventricle has normal function. The left ventricle has no regional wall motion abnormalities. Definity contrast agent was given IV to delineate the left ventricular  endocardial borders. The left ventricular internal cavity size was normal in size. There is mild concentric left ventricular hypertrophy. Abnormal (paradoxical) septal motion consistent with post-operative status and abnormal (paradoxical) septal motion, consistent with left bundle branch block. Left ventricular diastolic parameters are consistent with Grade I diastolic dysfunction (impaired relaxation). Elevated left atrial pressure. Right Ventricle: The right  ventricular size is normal. No increase in right ventricular wall thickness. Right ventricular systolic function is normal. Left Atrium: Left atrial size was mildly dilated. Right Atrium: Right atrial size was normal in size. Pericardium: There is no evidence of pericardial effusion. Mitral Valve: The mitral valve is normal in structure. Normal mobility of the mitral valve leaflets. Mild mitral valve regurgitation. No evidence of mitral valve stenosis. Tricuspid Valve: The tricuspid valve is normal in structure. Tricuspid valve regurgitation is mild . No evidence of tricuspid stenosis. Aortic Valve: The aortic valve has been repaired/replaced. Aortic valve regurgitation is not visualized. No aortic stenosis is present. Aortic valve mean gradient measures 28.0 mmHg. Aortic valve peak gradient measures 45.1 mmHg. Aortic valve area, by VTI measures 1.31 cm. There is a 23 mm Edwards-SAPIEN valve present in the aortic position. Pulmonic Valve: The pulmonic valve was normal in structure. Pulmonic valve regurgitation is not visualized. No evidence of pulmonic stenosis. Aorta: The aortic root is normal in size and structure. Venous: The inferior vena cava is normal in size with greater than 50% respiratory variability, suggesting right atrial pressure of 3 mmHg. IAS/Shunts: No atrial level shunt detected by color flow Doppler.  LEFT VENTRICLE PLAX 2D LVIDd:         4.40 cm  Diastology LVIDs:         3.10 cm  LV e' lateral:   14.00 cm/s LV PW:         1.10 cm  LV E/e' lateral: 8.1 LV IVS:        1.00 cm  LV e' medial:    4.88 cm/s LVOT diam:     1.80 cm  LV E/e' medial:  23.4 LV SV:         70 LV SV Index:   35 LVOT Area:     2.54 cm  LEFT ATRIUM           Index       RIGHT ATRIUM           Index LA  diam:      4.00 cm 2.00 cm/m  RA Area:     19.80 cm LA Vol (A4C): 71.6 ml 35.83 ml/m RA Volume:   56.70 ml  28.37 ml/m  AORTIC VALVE AV Area (Vmax):    1.23 cm AV Area (Vmean):   1.17 cm AV Area (VTI):     1.31 cm AV  Vmax:           335.60 cm/s AV Vmean:          242.600 cm/s AV VTI:            0.532 m AV Peak Grad:      45.1 mmHg AV Mean Grad:      28.0 mmHg LVOT Vmax:         162.00 cm/s LVOT Vmean:        112.000 cm/s LVOT VTI:          0.273 m LVOT/AV VTI ratio: 0.51  AORTA Ao Root diam: 2.50 cm MITRAL VALVE MV Area (PHT): 3.42 cm     SHUNTS MV Decel Time: 222 msec     Systemic VTI:  0.27 m MV E velocity: 114.00 cm/s  Systemic Diam: 1.80 cm MV A velocity: 95.50 cm/s MV E/A ratio:  1.19 Ena Dawley MD Electronically signed by Ena Dawley MD Signature Date/Time: 03/22/2020/1:01:34 PM    Final    ECHO TEE  Result Date: 03/26/2020    TRANSESOPHOGEAL ECHO REPORT   Patient Name:   YARON GRASSE Grochowski Date of Exam: 03/26/2020 Medical Rec #:  [id number]           Height:       70.0 in Accession #:    [id number]          Weight:       173.0 lb Date of Birth:  [birth year]           BSA:          1.963 m Patient Age:    73 years            BP:           127/92 mmHg Patient Gender: M                   HR:           76 bpm. Exam Location:  Inpatient Procedure: Transesophageal Echo, Color Doppler, Cardiac Doppler and 3D Echo Indications:     Bacteremia R78.81  History:         Patient has prior history of Echocardiogram examinations, most                  recent 03/22/2020.                  Aortic Valve: 23 mm Edwards bioprosthetic valve is present in                  the aortic position. Procedure Date: 08/16/2016.  Sonographer:     Mikki Santee RDCS (AE) Referring Phys:  [id number] Erasmo Downer FURTH Diagnosing Phys: Candee Furbish MD PROCEDURE: After discussion of the risks and benefits of a TEE, an informed consent was obtained. The transesophogeal probe was passed without difficulty through the esophogus of the patient. Sedation performed by different physician. The patient was monitored while under deep sedation. Anesthestetic sedation was provided intravenously by Anesthesiology: 228.'4mg'$  of Propofol. The patient's vital signs;  including heart rate, blood pressure, and oxygen saturation; remained stable  throughout the procedure. The patient developed no complications during the procedure. IMPRESSIONS  1. Left ventricular ejection fraction, by estimation, is 60 to 65%. The left ventricle has normal function. The left ventricle has no regional wall motion abnormalities.  2. Right ventricular systolic function is normal. The right ventricular size is normal. There is normal pulmonary artery systolic pressure.  3. No left atrial/left atrial appendage thrombus was detected.  4. The mitral valve is myxomatous. Moderate mitral valve regurgitation. No evidence of mitral stenosis.  5. The aortic valve has been repaired/replaced. Aortic valve regurgitation is not visualized. No aortic stenosis is present. There is a 23 mm Edwards bioprosthetic valve present in the aortic position. Procedure Date: 08/16/2016. Echo findings are consistent with normal structure and function of the aortic valve prosthesis.  6. The inferior vena cava is normal in size with greater than 50% respiratory variability, suggesting right atrial pressure of 3 mmHg. Conclusion(s)/Recommendation(s): Normal biventricular function without evidence of hemodynamically significant valvular heart disease. No evidence of vegetation/infective endocarditis on this transesophageal echocardiogram. FINDINGS  Left Ventricle: Left ventricular ejection fraction, by estimation, is 60 to 65%. The left ventricle has normal function. The left ventricle has no regional wall motion abnormalities. The left ventricular internal cavity size was normal in size. There is  no left ventricular hypertrophy. Right Ventricle: The right ventricular size is normal. No increase in right ventricular wall thickness. Right ventricular systolic function is normal. There is normal pulmonary artery systolic pressure. The tricuspid regurgitant velocity is 2.41 m/s, and  with an assumed right atrial pressure of 10 mmHg,  the estimated right ventricular systolic pressure is 95.2 mmHg. Left Atrium: Left atrial size was normal in size. No left atrial/left atrial appendage thrombus was detected. Right Atrium: Right atrial size was normal in size. Pericardium: There is no evidence of pericardial effusion. Mitral Valve: The mitral valve is myxomatous. There is mild holosystolic prolapse of multiple segments of the anterior leaflet of the mitral valve. Normal mobility of the mitral valve leaflets. Moderate mitral valve regurgitation, with eccentric posteriorly directed jet. No evidence of mitral valve stenosis. Tricuspid Valve: The tricuspid valve is normal in structure. Tricuspid valve regurgitation is not demonstrated. No evidence of tricuspid stenosis. Aortic Valve: The aortic valve has been repaired/replaced. Aortic valve regurgitation is not visualized. No aortic stenosis is present. There is a 23 mm Edwards bioprosthetic valve present in the aortic position. Procedure Date: 08/16/2016. Echo findings are consistent with normal structure and function of the aortic valve prosthesis. Pulmonic Valve: The pulmonic valve was normal in structure. Pulmonic valve regurgitation is not visualized. No evidence of pulmonic stenosis. Aorta: The aortic root is normal in size and structure. Venous: The inferior vena cava is normal in size with greater than 50% respiratory variability, suggesting right atrial pressure of 3 mmHg. IAS/Shunts: No atrial level shunt detected by color flow Doppler.  TRICUSPID VALVE TR Peak grad:   23.2 mmHg TR Vmax:        241.00 cm/s Candee Furbish MD Electronically signed by Candee Furbish MD Signature Date/Time: 03/26/2020/10:49:18 AM    Final    VAS [place] LOWER EXTREMITY VENOUS (DVT) (MC and WL 7a-7p)  Result Date: 03/20/2020  Lower Venous DVTStudy Indications: Pain, and Edema.  Performing Technologist: June Leap RDMS, RVT  Examination Guidelines: A complete evaluation includes B-mode imaging, spectral Doppler, color  Doppler, and power Doppler as needed of all accessible portions of each vessel. Bilateral testing is considered an integral part of a complete examination. Limited examinations  for reoccurring indications may be performed as noted. The reflux portion of the exam is performed with the patient in reverse Trendelenburg.  +---------+---------------+---------+-----------+----------+--------------+ LEFT     CompressibilityPhasicitySpontaneityPropertiesThrombus Aging +---------+---------------+---------+-----------+----------+--------------+ CFV      Full           Yes      Yes                                 +---------+---------------+---------+-----------+----------+--------------+ SFJ      Full                                                        +---------+---------------+---------+-----------+----------+--------------+ FV Prox  Full                                                        +---------+---------------+---------+-----------+----------+--------------+ FV Mid   Full                                                        +---------+---------------+---------+-----------+----------+--------------+ FV DistalFull                                                        +---------+---------------+---------+-----------+----------+--------------+ PFV      Full                                                        +---------+---------------+---------+-----------+----------+--------------+ POP      Full           Yes      Yes                                 +---------+---------------+---------+-----------+----------+--------------+ PTV      Full                                                        +---------+---------------+---------+-----------+----------+--------------+ PERO     Full                                                        +---------+---------------+---------+-----------+----------+--------------+     Summary: LEFT: - There is no  evidence of deep vein thrombosis in the lower extremity.  - Large collection  of fluid with internal echoes extending from popliteal fossa to lower calf. Appears to be large ruptured baker's cyst.  *See table(s) above for measurements and observations. Electronically signed by Monica Martinez MD on 03/20/2020 at 2:00:07 PM.    Final    [place] EKG SITE RITE  Result Date: 03/29/2020 If Site Rite image not attached, placement could not be confirmed due to current cardiac rhythm.  [place] EKG SITE RITE  Result Date: 03/26/2020 If Site Rite image not attached, placement could not be confirmed due to current cardiac rhythm.   Microbiology: Recent Results (from the past 240 hour(s))  Aerobic/Anaerobic Culture (surgical/deep wound)     Status: None (Preliminary result)   Collection Time: 04/07/20  9:20 AM   Specimen: Abscess  Result Value Ref Range Status   Specimen Description ABSCESS L4/L5 DISC SPACE  Final   Special Requests Normal  Final   Gram Stain   Final    MODERATE WBC PRESENT, PREDOMINANTLY PMN NO ORGANISMS SEEN Performed at [facility], [address] [place], [place] [address]    Culture PENDING  Incomplete   Report Status PENDING  Incomplete     Labs: Basic Metabolic Panel: Recent Labs  Lab 04/03/20 0500 04/04/20 0305 04/05/20 0458 04/06/20 0438 04/07/20 0432  NA 137 135 136 136 136  K 4.1 3.9 3.8 4.1 4.2  CL 101 101 99 101 101  CO2 '27 27 28 28 26  '$ GLUCOSE 114* 153* 136* 156* 175*  BUN '16 18 18 18 21  '$ CREATININE 1.36* 1.43* 1.27* 1.31* 1.32*  CALCIUM 9.0 9.0 8.8* 8.8* 9.0   Liver Function Tests: Recent Labs  Lab 04/01/20 0430  AST 99*  ALT 118*  ALKPHOS 79  BILITOT 1.4*  PROT 5.6*  ALBUMIN 1.8*   No results for input(s): LIPASE, AMYLASE in the last 168 hours. No results for input(s): AMMONIA in the last 168 hours. CBC: Recent Labs  Lab 04/03/20 0500 04/04/20 0305 04/05/20 0458 04/06/20 0438 04/07/20 0432  WBC 11.1* 10.6* 8.5 9.7 7.7  NEUTROABS  9.2* 8.6* 6.7 7.8* 6.0  HGB 7.8* 7.8* 7.3* 7.4* 7.6*  HCT 24.5* 24.5* 23.2* 23.3* 23.9*  MCV 94.6 93.5 93.9 94.3 94.1  PLT 298 319 321 308 340   Cardiac Enzymes: No results for input(s): CKTOTAL, CKMB, CKMBINDEX, TROPONINI in the last 168 hours. BNP: BNP (last 3 results) No results for input(s): BNP in the last 8760 hours.  ProBNP (last 3 results) No results for input(s): PROBNP in the last 8760 hours.  CBG: Recent Labs  Lab 04/06/20 1123 04/06/20 1606 04/06/20 2053 04/07/20 0646 04/07/20 1121  GLUCAP 119* 200* 214* 149* 188*       Signed:  Alma Friendly, MD [facility] 04/07/2020, 1:45 PM

## 2020-04-07 NOTE — Progress Notes (Signed)
Patient on floor in room M06. Vitals are normal and stable, pain is at a 3, and patient is resting comfortable with wife at bedside

## 2020-04-07 NOTE — Progress Notes (Signed)
Orthopedic Tech Progress Note Patient Details:  Johnathan Murray [birth year] [id number] Spoke with RN about patient's back brace. I told her that I personally called in that order on the 14th of May. I saw in the notes that THERAPY worked with patient with back brace yesterday 04/06/20 at 1730ish. So patient should have brace Patient ID: Johnathan Murray, male   DOB: 1951/01/17, 69 y.o.   MRN: [id number]   [name] 04/07/2020, 7:12 PM

## 2020-04-07 NOTE — H&P (Signed)
Physical Medicine and Rehabilitation Admission H&P        Chief Complaint  Patient presents with  . Back Pain  . Neck Pain  : HPI: Johnathan Murray is a 69 year old right-handed male with history of diabetes mellitus, PAF maintained on amiodarone as well as Eliquis, hypertension, bioprosthetic aortic valve replacement 2017, lumbar laminectomy decompression 06/11/2019 as well as recent C3-4 anterior cervical discectomy with interbody fusion for cervical stenosis myelopathy 03/02/2020 per [name].  Per chart review patient lives with spouse.  1 level home 7 steps to entry.  He was using a rolling walker prior to admission.  Wife works during the day.  Presented 03/19/2020 with low-grade fever and weakness lower extremities.  Lactic acid 1.6, sodium 130, BUN 98, creatinine 6.40 with latest prior creatinine 1.63 02/24/2020, WBC 12,600, hemoglobin 10.4, CK 20, urinalysis showed RBCs of 21-50 WBC 21-50 protein around 100 bacteria rare hyaline cast, SARS coronavirus negative.  His LFTs were markedly elevated AST 415 ALT 429 total bilirubin 5.5.  CT abdomen pelvis as well as CT cervical spine unremarkable.  MRI cervical spine negative for infection.  Patient was hypotensive and received fluid bolus.  Placed on broad spectrum antibiotics for suspected sepsis.  MRI lumbar spine findings consistent with acute osteomyelitis discitis at L4-5.  Evidence for associated soft tissue infection cellulitis within the psoas musculature bilaterally with a few small superimposed abscesses.  Additional multilevel degenerative spondylosis most pronounced at L3-4.  Neurosurgery consulted no plan for surgical intervention and underwent aspiration of disc space L4-5 with results pending.   TEE showed no obvious vegetation and follow-up per cardiology services.  Orthopedic services consulted Dr. Mardelle Matte for bilateral knee pain with noted swelling of left knee.  X-ray showed diffuse degenerative changes as well as a large joint  effusion.  Doppler showed ruptured Baker's cyst on left lower extremity.  Patient underwent left lower extremity joint aspiration 03/21/2020 and right lower extremity joint aspiration 03/22/2020 both showing intracellular calcium pyrophosphate crystals WBC was up to 15,000 culture grew rare E Faecalis.  Patient also did receive steroid injection of both knees.  Underwent I&D bilateral knees 03/24/2020.  Infectious disease continues to follow currently maintained on intravenous ampicillin x7 to 8 weeks through 05/20/2020.  PICC line was placed 03/29/2020.  AKI on CKD stage III continues to improve multifactorial from bacteremia with latest creatinine 1.32.  Normocytic anemia/postoperative anemia patient was transfused 2 units of packed red blood cells latest hemoglobin 7.6.  Therapy evaluations completed and patient was admitted for a comprehensive rehab program   Review of Systems  Constitutional: Positive for fever. Negative for chills.  HENT: Negative for hearing loss.   Eyes: Negative for blurred vision and double vision.  Respiratory: Negative for cough.        Some dyspnea on exertion  Cardiovascular: Positive for palpitations and leg swelling. Negative for chest pain.  Gastrointestinal: Positive for constipation. Negative for heartburn, nausea and vomiting.  Genitourinary: Positive for urgency. Negative for dysuria, flank pain and hematuria.  Musculoskeletal: Positive for joint pain and myalgias.  Skin: Negative for rash.  Neurological: Positive for weakness.  All other systems reviewed and are negative.       Past Medical History:  Diagnosis Date  . Abnormal LFTs (liver function tests) 10/31/2016  . Abnormal nuclear stress test 05/03/2018  . Acid reflux 02/15/2018    not at present  . Acute otitis externa of left ear 11/13/2019  . Acute suppurative otitis media of  left ear without spontaneous rupture of tympanic membrane 11/13/2019  . Anemia      low iron  . Aortic stenosis 05/03/2018  .  Arthritis 02/15/2018    Overview:  shoulder  . Cancer ([place])      skin cancer on left wrist  . Cervical myelopathy ([place]) 04/22/2019  . Conductive hearing loss of left ear 11/13/2019  . Coronary artery disease involving native coronary artery of native heart without angina pectoris 05/30/2016  . Coronary artery disease of native artery of native heart with stable angina pectoris ([place])      pt. denies  . Diabetes mellitus due to underlying condition with unspecified complications ([place]) [id number]  . Dyslipidemia 05/30/2016  . Dyspnea      upon exertion,  as of 06/05/09 not having any recent chest pain  . Dysrhythmia      a fib  . Essential hypertension 05/30/2016  . Heart murmur      Has had an Aortic valve replacement  . Hyperactive gag reflex 02/15/2018  . Hypothyroidism    . Intermittent claudication ([place]) 10/31/2018  . Lumbar stenosis with neurogenic claudication 06/11/2019  . Macular degeneration 02/15/2018  . PAF (paroxysmal atrial fibrillation) ([place]) 05/30/2016  . Pre-operative cardiovascular examination 01/03/2020  . Preoperative cardiovascular examination 04/17/2019  . Pseudogout of left knee 03/21/2020  . Renal insufficiency 01/17/2020  . S/P aortic valve replacement 01/17/2020  . Tinnitus of left ear 11/13/2019         Past Surgical History:  Procedure Laterality Date  . ANTERIOR CERVICAL DECOMP/DISCECTOMY FUSION N/A 04/22/2019    Procedure: Anterior Cervical Discectomy and Fusion, Cervical four-five, Cervical five-six;  Surgeon: Earnie Larsson, MD;  Location: [facility];  Service: Neurosurgery;  Laterality: N/A;  . ANTERIOR CERVICAL DECOMP/DISCECTOMY FUSION N/A 03/02/2020    Procedure: Anterior Cervical Decompression Fusion  - Cervical three-Cervical four;  Surgeon: Earnie Larsson, MD;  Location: [facility];  Service: Neurosurgery;  Laterality: N/A;  . AORTIC VALVE REPLACEMENT      . CARDIAC CATHETERIZATION        several stents  . CARDIAC SURGERY      . CORONARY ARTERY BYPASS GRAFT   2017    patient  unsure how many, had at [facility]  . HAND SURGERY      . IRRIGATION AND DEBRIDEMENT KNEE Bilateral 03/24/2020    Procedure: ARTHROSCOPY IRRIGATION AND DEBRIDEMENT BILATERAL  KNEE;  Surgeon: Marchia Bond, MD;  Location: [facility];  Service: Orthopedics;  Laterality: Bilateral;  . KNEE SURGERY      . LEFT HEART CATH AND CORONARY ANGIOGRAPHY N/A 01/21/2020    Procedure: LEFT HEART CATH AND CORONARY ANGIOGRAPHY;  Surgeon: Belva Crome, MD;  Location: [facility];  Service: Cardiovascular;  Laterality: N/A;  . LEFT HEART CATH AND CORS/GRAFTS ANGIOGRAPHY N/A 05/16/2018    Procedure: LEFT HEART CATH AND CORS/GRAFTS ANGIOGRAPHY;  Surgeon: Burnell Blanks, MD;  Location: [facility];  Service: Cardiovascular;  Laterality: N/A;  . LUMBAR LAMINECTOMY/DECOMPRESSION MICRODISCECTOMY Bilateral 06/11/2019    Procedure: Bilateral Lumbar Four-FiveLaminectomy/Foraminotomy;  Surgeon: Earnie Larsson, MD;  Location: [facility];  Service: Neurosurgery;  Laterality: Bilateral;  posterior  . TEE WITHOUT CARDIOVERSION N/A 03/26/2020    Procedure: TRANSESOPHAGEAL ECHOCARDIOGRAM (TEE);  Surgeon: Jerline Pain, MD;  Location: [facility] END[facility];  Service: Cardiovascular;  Laterality: N/A;    Family History  Problem Relation Age of Onset  . Prostate cancer Brother    . Heart disease Maternal Grandmother    .  Brain cancer Mother      Social History:  reports that he has never smoked. He has never used smokeless tobacco. He reports previous alcohol use. He reports that he does not use drugs. Allergies:       Allergies  Allergen Reactions  . Atorvastatin Other (See Comments)      Urinary retention   . Nebivolol Diarrhea          Medications Prior to Admission  Medication Sig Dispense Refill  . amiodarone (PACERONE) 400 MG tablet Take 1 tablet (400 mg total) by mouth daily. Take 400 mg for 2 weeks then decrease to 200 mg daily. (Patient taking differently: Take 200 mg by mouth at bedtime. ) 30 tablet  6  . amLODipine (NORVASC) 10 MG tablet Take 10 mg by mouth at bedtime.       Marland Kitchen apixaban (ELIQUIS) 5 MG TABS tablet Take 1 tablet (5 mg total) by mouth 2 (two) times daily. 180 tablet 3  . benazepril (LOTENSIN) 40 MG tablet Take 40 mg by mouth at bedtime.    3  . Coenzyme Q10 (COQ10) 200 MG CAPS Take 200 mg by mouth daily.       Marland Kitchen ezetimibe (ZETIA) 10 MG tablet Take 1 tablet (10 mg total) by mouth at bedtime. 90 tablet 1  . furosemide (LASIX) 40 MG tablet Take 40 mg by mouth daily.   3  . levothyroxine (SYNTHROID) 25 MCG tablet Take 25 mcg by mouth daily before breakfast.       . metFORMIN (GLUCOPHAGE) 500 MG tablet Take 500 mg by mouth at bedtime.       . nitroGLYCERIN (NITROSTAT) 0.4 MG SL tablet Place 1 tablet (0.4 mg total) under the tongue every 5 (five) minutes as needed for chest pain. 25 tablet 6  . vitamin B-12 (CYANOCOBALAMIN) 1000 MCG tablet Take 1,000 mcg by mouth daily.      . vitamin C (ASCORBIC ACID) 500 MG tablet Take 500 mg by mouth daily.       Marland Kitchen zinc gluconate 50 MG tablet Take 50 mg by mouth daily.      . cyclobenzaprine (FLEXERIL) 10 MG tablet Take 1 tablet (10 mg total) by mouth at bedtime. (Patient not taking: Reported on 01/20/2020) 30 tablet 3      Drug Regimen Review Drug regimen was reviewed and remains appropriate with no significant issues identified   Home: Home Living Family/patient expects to be discharged to:: Private residence Living Arrangements: Spouse/significant other Available Help at Discharge: Family, Available 24 hours/day Type of Home: House Home Access: Stairs to enter [employer] of Steps: 7 Entrance Stairs-Rails: Can reach both Home Layout: One level Bathroom Shower/Tub: Multimedia programmer: Standard Home Equipment: Grab bars - tub/shower, Civil engineer, contracting - built in, [place] - single point, Environmental consultant - 2 wheels   Functional History: Prior Function Level of Independence: Independent Gait / Transfers Assistance Needed: pt  reports using RW for very short distance ambulation PTA, states he had very little tolerence with periodic L knee buckling. for the first week after surgery, pt reports he was up with cane and felt he really didn't even need taht. ADL's / Homemaking Assistance Needed: Pt's wife assisted pt with getting up in the morning, then wife would go to work so pt was home alone during the day. 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   Functional Status:  Mobility: Bed Mobility Overal bed mobility: Needs Assistance Bed Mobility: Sit to Sidelying Sidelying to  sit: Mod assist Supine to sit: Min guard, HOB elevated Sit to supine: Supervision, HOB elevated Sit to sidelying: Mod assist General bed mobility comments: Mod assist to help LEs onto bed Transfers Overall transfer level: Needs assistance Equipment used: Rolling walker (2 wheeled) Transfers: Sit to/from Stand Sit to Stand: +2 physical assistance, Mod assist  Lateral/Scoot Transfers: Min assist, +2 physical assistance, +2 safety/equipment General transfer comment: stood from recliner with bilateral support; slow rise, painful Ambulation/Gait Ambulation/Gait assistance: Min assist, +2 safety/equipment Gait Distance (Feet): (pivotal steps recliner back to bed) Assistive device: Rolling walker (2 wheeled) Gait Pattern/deviations: Decreased step length - right, Decreased step length - left, Trunk flexed General Gait Details: Slow, small steps to turn to get to bed Gait velocity: significantly decreased   ADL: ADL Overall ADL's : Needs assistance/impaired Eating/Feeding: Independent Grooming: Wash/dry hands, Wash/dry face, Set up, Sitting, With caregiver independent assisting Upper Body Bathing: Set up, Supervision/ safety, Sitting, With caregiver independent assisting Lower Body Bathing: Maximal assistance Upper Body Dressing : Set up, Supervision/safety, Sitting, With caregiver independent assisting Lower Body Dressing: Minimal  assistance, Sitting/lateral leans Lower Body Dressing Details (indicate cue type and reason): Pt able to don socks seated in recliner, flexing L knee up to chair Toilet Transfer: Minimal assistance, +2 for physical assistance, Cueing for safety, Cueing for sequencing, Ambulation, RW Toilet Transfer Details (indicate cue type and reason): simulated to recliner. Pt couldn't quite make it ambulating towards bathroom with RW due to B UEs fatiguing Toileting- Clothing Manipulation and Hygiene: Minimal assistance, Sit to/from stand Functional mobility during ADLs: Minimal assistance, +2 for physical assistance, Rolling walker, Cueing for safety, Cueing for sequencing General ADL Comments: Provided UE theraband and education, discussed CIR   Cognition: Cognition Overall Cognitive Status: Within Functional Limits for tasks assessed Orientation Level: Oriented to person, Oriented to place( April 2001, unsure why here, knew he was @ clinic/hosp) Cognition Arousal/Alertness: Awake/alert Behavior During Therapy: WFL for tasks assessed/performed Overall Cognitive Status: Within Functional Limits for tasks assessed Area of Impairment: Safety/judgement Safety/Judgement: Decreased awareness of safety, Decreased awareness of deficits General Comments: Generally unhappy with his progress   Physical Exam: Blood pressure 115/65, pulse 87, temperature 98.2 F (36.8 C), temperature source Oral, resp. rate 16, height 5\' 10"  (1.778 m), weight 82.5 kg, SpO2 100 %. Physical Exam  Constitutional: He is oriented to person, place, and time. No distress.  HENT:  Head: Normocephalic.  Eyes: Pupils are equal, round, and reactive to light. EOM are normal.  Cardiovascular: Normal rate.  Murmur heard. Respiratory: Effort normal. No respiratory distress. He has no wheezes.  GI: Soft.  Musculoskeletal:     Cervical back: Normal range of motion.     Comments: Both knees swollen and tender. Low back TTP.   Neurological:  He is alert and oriented to person, place, and time. No cranial nerve deficit.  UE motor 5/5. LE 3-/5 prox (pain inhibition from back/knees) to 4/5 with ADF/PF. No focal sensory loss. DTR's 1+  Skin: Skin is warm. He is not diaphoretic.  Psychiatric: He has a normal mood and affect. His behavior is normal.      Lab Results Last 48 Hours        Results for orders placed or performed during the hospital encounter of 03/19/20 (from the past 48 hour(s))  Glucose, capillary     Status: Abnormal    Collection Time: 04/05/20 11:47 AM  Result Value Ref Range    Glucose-Capillary 206 (H) 70 - 99 mg/dL  Comment: Glucose reference range applies only to samples taken after fasting for at least 8 hours.  Glucose, capillary     Status: Abnormal    Collection Time: 04/05/20  4:52 PM  Result Value Ref Range    Glucose-Capillary 209 (H) 70 - 99 mg/dL      Comment: Glucose reference range applies only to samples taken after fasting for at least 8 hours.  Glucose, capillary     Status: None    Collection Time: 04/05/20  8:50 PM  Result Value Ref Range    Glucose-Capillary 91 70 - 99 mg/dL      Comment: Glucose reference range applies only to samples taken after fasting for at least 8 hours.  Type and screen [place]     Status: None    Collection Time: 04/06/20  4:29 AM  Result Value Ref Range    ABO/RH(D) A POS      Antibody Screen NEG      Sample Expiration          04/09/2020,2359 Performed at [facility], [place] [address] [place], [place] [address]    CBC with Differential/Platelet     Status: Abnormal    Collection Time: 04/06/20  4:38 AM  Result Value Ref Range    WBC 9.7 4.0 - 10.5 K/uL    RBC 2.47 (L) 4.22 - 5.81 MIL/uL    Hemoglobin 7.4 (L) 13.0 - 17.0 g/dL    HCT 23.3 (L) 39.0 - 52.0 %    MCV 94.3 80.0 - 100.0 fL    MCH 30.0 26.0 - 34.0 pg    MCHC 31.8 30.0 - 36.0 g/dL    RDW 15.4 11.5 - 15.5 %    Platelets 308 150 - 400 K/uL    nRBC 0.0 0.0 - 0.2 %      Neutrophils Relative % 79 %    Neutro Abs 7.8 (H) 1.7 - 7.7 K/uL    Lymphocytes Relative 12 %    Lymphs Abs 1.1 0.7 - 4.0 K/uL    Monocytes Relative 6 %    Monocytes Absolute 0.6 0.1 - 1.0 K/uL    Eosinophils Relative 1 %    Eosinophils Absolute 0.1 0.0 - 0.5 K/uL    Basophils Relative 1 %    Basophils Absolute 0.1 0.0 - 0.1 K/uL    Immature Granulocytes 1 %    Abs Immature Granulocytes 0.06 0.00 - 0.07 K/uL      Comment: Performed at [facility] [address] [place], [place] [id number]  Basic metabolic panel     Status: Abnormal    Collection Time: 04/06/20  4:38 AM  Result Value Ref Range    Sodium 136 135 - 145 mmol/L    Potassium 4.1 3.5 - 5.1 mmol/L    Chloride 101 98 - 111 mmol/L    CO2 28 22 - 32 mmol/L    Glucose, Bld 156 (H) 70 - 99 mg/dL      Comment: Glucose reference range applies only to samples taken after fasting for at least 8 hours.    BUN 18 8 - 23 mg/dL    Creatinine, Ser 1.31 (H) 0.61 - 1.24 mg/dL    Calcium 8.8 (L) 8.9 - 10.3 mg/dL    GFR calc non Af Amer 56 (L) >60 mL/min    GFR calc Af Amer >60 >60 mL/min    Anion gap 7 5 - 15      Comment: Performed  at [facility], [place] [address] [place], [place] [address]  Glucose, capillary     Status: Abnormal    Collection Time: 04/06/20  7:08 AM  Result Value Ref Range    Glucose-Capillary 142 (H) 70 - 99 mg/dL      Comment: Glucose reference range applies only to samples taken after fasting for at least 8 hours.  Glucose, capillary     Status: Abnormal    Collection Time: 04/06/20 11:23 AM  Result Value Ref Range    Glucose-Capillary 119 (H) 70 - 99 mg/dL      Comment: Glucose reference range applies only to samples taken after fasting for at least 8 hours.  Glucose, capillary     Status: Abnormal    Collection Time: 04/06/20  4:06 PM  Result Value Ref Range    Glucose-Capillary 200 (H) 70 - 99 mg/dL      Comment: Glucose reference range applies only to samples taken after fasting for at  least 8 hours.  Glucose, capillary     Status: Abnormal    Collection Time: 04/06/20  8:53 PM  Result Value Ref Range    Glucose-Capillary 214 (H) 70 - 99 mg/dL      Comment: Glucose reference range applies only to samples taken after fasting for at least 8 hours.  CBC with Differential/Platelet     Status: Abnormal    Collection Time: 04/07/20  4:32 AM  Result Value Ref Range    WBC 7.7 4.0 - 10.5 K/uL    RBC 2.54 (L) 4.22 - 5.81 MIL/uL    Hemoglobin 7.6 (L) 13.0 - 17.0 g/dL    HCT 23.9 (L) 39.0 - 52.0 %    MCV 94.1 80.0 - 100.0 fL    MCH 29.9 26.0 - 34.0 pg    MCHC 31.8 30.0 - 36.0 g/dL    RDW 15.5 11.5 - 15.5 %    Platelets 340 150 - 400 K/uL    nRBC 0.0 0.0 - 0.2 %    Neutrophils Relative % 78 %    Neutro Abs 6.0 1.7 - 7.7 K/uL    Lymphocytes Relative 14 %    Lymphs Abs 1.1 0.7 - 4.0 K/uL    Monocytes Relative 4 %    Monocytes Absolute 0.3 0.1 - 1.0 K/uL    Eosinophils Relative 2 %    Eosinophils Absolute 0.2 0.0 - 0.5 K/uL    Basophils Relative 1 %    Basophils Absolute 0.1 0.0 - 0.1 K/uL    Immature Granulocytes 1 %    Abs Immature Granulocytes 0.06 0.00 - 0.07 K/uL      Comment: Performed at [facility] [address] [place], [place] [id number]  Basic metabolic panel     Status: Abnormal    Collection Time: 04/07/20  4:32 AM  Result Value Ref Range    Sodium 136 135 - 145 mmol/L    Potassium 4.2 3.5 - 5.1 mmol/L    Chloride 101 98 - 111 mmol/L    CO2 26 22 - 32 mmol/L    Glucose, Bld 175 (H) 70 - 99 mg/dL      Comment: Glucose reference range applies only to samples taken after fasting for at least 8 hours.    BUN 21 8 - 23 mg/dL    Creatinine, Ser 1.32 (H) 0.61 - 1.24 mg/dL    Calcium 9.0 8.9 - 10.3 mg/dL    GFR calc non Af Amer 55 (L) >60  mL/min    GFR calc Af Amer >60 >60 mL/min    Anion gap 9 5 - 15      Comment: Performed at [facility] [address] [place], [place] [address]  Glucose, capillary     Status: Abnormal    Collection Time:  04/07/20  6:46 AM  Result Value Ref Range    Glucose-Capillary 149 (H) 70 - 99 mg/dL      Comment: Glucose reference range applies only to samples taken after fasting for at least 8 hours.      Imaging Results (Last 48 hours)  No results found.           Medical Problem List and Plan: 1.  Decreased functional mobility secondary to sepsis/Enterococcus faecalis bacteremia with septic knees and lumbar discitis             -patient may shower             -ELOS/Goals: 12-14 days, supervision to mod I 2.  Antithrombotics: -DVT/anticoagulation:  Eliquis 5mg  BID             -antiplatelet therapy: N/A 3. Pain Management: Flexeril as needed, oxycodone as needed             -heat, kpad as appropriate, lumbar corset, ice for knees.             -ROM, core strengthening with therapies 4. Mood: Trazodone as needed             -antipsychotic agents: N/C 5. Neuropsych: This patient is capable of making decisions on his own behalf. 6. Skin/Wound Care: Routine skin checks 7. Fluids/Electrolytes/Nutrition: Routine in and outs with follow-up chemistries 8.  Acute osteomyelitis discitis L4-5.  Neurosurgery follow-up no surgical intervention.  Lumbar corset for comfort.              -S/p FNA biopsy L4-5 disc today.  Await aspiration results   9.  Recent C3-4 ACDF 03/02/2020 per [name] for cervical myelopathy.  Cervical collar has been discontinued. 10.  Septic knee.  Status post aspiration and steroid injection with I&D of bilateral knees 03/24/2020.  Follow-up orthopedic services 11.  ID/Enterococcus bacteremia.  Continue intravenous ampicillin through 05/20/2020.  Follow-up infectious disease 12.  PAF/bioprosthetic aortic valve replacement 2017.  Await plan to resume Eliquis.  Amiodarone currently on hold 13.  AKI.  Follow-up chemistries 14.  Diabetes mellitus.  Hemoglobin A1c 6.8.  Currently on NovoLog 3 units 3 times daily as well as Lantus insulin 10 units twice daily.  Patient on Glucophage prior to  admission 500 mg nightly.  Currently on hold due to AKI. Titrate regime to effect 15.  Acute on chronic anemia.  Continue Niferex.  Follow-up CBC 16.  Hypothyroidism.  Synthroid       Lavon Paganini Angiulli, PA-C 04/07/2020  I have personally performed a face to face diagnostic evaluation of this patient and formulated the key components of the plan.  Additionally, I have personally reviewed laboratory data, imaging studies, as well as relevant notes and concur with the physician assistant's documentation above.  The patient's status has not changed from the original H&P.  Any changes in documentation from the acute care chart have been noted above.  Meredith Staggers, MD, [facility]

## 2020-04-07 NOTE — Progress Notes (Signed)
Nutrition Follow-up  DOCUMENTATION CODES:   Severe malnutrition in context of acute illness/injury  INTERVENTION:  Ensure Enlive po QID, each supplement provides 350 kcal and 20 grams of protein  D/c Pro-stat, pt refusing  D/c Magic cup, pt refusing   NUTRITION DIAGNOSIS:   Severe Malnutrition related to acute illness as evidenced by energy intake < or equal to 50% for > or equal to 5 days, percent weight loss(6% weight loss within 1 month).  Ongoing.  GOAL:   Patient will meet greater than or equal to 90% of their needs  Progressing.  MONITOR:   PO intake, Supplement acceptance, Labs  REASON FOR ASSESSMENT:   Consult Assessment of nutrition requirement/status, Poor PO  ASSESSMENT:   69 yo male admitted with ruptured LLE Baker's cyst, enterococcal bacteremia, possible AV endocarditis, AKI. S/P aspiration of L knee. PMH includes CKD-IIIa, PAF, HTN, biprosthetic aortic valve replacement, recent cervical discectomy and fusion.  Pt has enterococcal bacteremia and septic arthritis of his L knee.   5/4 - s/p bilateral knee arthroscopy irrigation and debridement; bilateral knee medial and lateral meisectomies 5/6 - TEE  5/13 MRI showing acute osteomyelitis discitis at L4-L5 5/18 s/p IR aspiration of disc space  Pt's wife reports pt's appetite has been improving and that pt ate his own breakfast today. However, pt's wife states she has still been finishing a good portion of his meals due to pt not liking meal options. Pt's wife states that the pt is refusing to consume [employer], but is doing well with Ensure.    PO Intake: 0-100% x last 8 recorded meals (60% average meal intake; however, this is not necessarily accurate as pt's wife has been consuming portions of the pt's meals)   UOP: 2,823ml x24hours I/O: -18,988.81ml since admit  Labs: CBGs 149-214 Medications reviewed and include: colace, Ensure Enlive TID, 63ml Pro-stat BID,Novolog, Lantus,  Senokot  Diet Order:   Diet Order            Diet Carb Modified Fluid consistency: Thin; Room service appropriate? Yes  Diet effective now              EDUCATION NEEDS:   Not appropriate for education at this time  Skin:  Skin Assessment: Skin Integrity Issues: Skin Integrity Issues:: Incisions Incisions: cervical, R/L leg  Last BM:  5/14  Height:   Ht Readings from Last 1 Encounters:  03/26/20 5\' 10"  (1.778 m)    Weight:   Wt Readings from Last 1 Encounters:  04/07/20 82.5 kg   BMI:  Body mass index is 26.1 kg/m.  Estimated Nutritional Needs:   Kcal:  1950-2150  Protein:  105-125 gm  Fluid:  >/= 2 L    Larkin Ina, MS, RD, LDN RD pager number and weekend/on-call pager number located in [place].

## 2020-04-07 NOTE — Consult Note (Signed)
   [facility] Inpatient Consult   04/07/2020  Johnathan Murray [birth year] [id number]  [facility] ACO Patient: listed with [employer]   Patient screened for less than 30 days re-hospitalization and length of stay 18 days,  to check if potential [place] of patient's medical record reveals patient is being recommended for inpatient rehabilitation with [facility].  Primary Care Provider is Dr. Delena Bali  Plan:  Continue to follow progress and disposition to assess for post hospital care management needs.    Please place a [facility] Care Management consult as appropriate and for questions contact:   Natividad Brood, RN BSN [facility] Liaison  [phone] business mobile phone Toll free office [phone]  Fax number: [phone] [email or web address]

## 2020-04-07 NOTE — H&P (Signed)
Physical Medicine and Rehabilitation Admission H&P    Chief Complaint  Patient presents with  . Back Pain  . Neck Pain  : HPI: Johnathan Murray is a 69 year old right-handed male with history of diabetes mellitus, PAF maintained on amiodarone as well as Eliquis, hypertension, bioprosthetic aortic valve replacement 2017, lumbar laminectomy decompression 06/11/2019 as well as recent C3-4 anterior cervical discectomy with interbody fusion for cervical stenosis myelopathy 03/02/2020 per [name].  Per chart review patient lives with spouse.  1 level home 7 steps to entry.  He was using a rolling walker prior to admission.  Wife works during the day.  Presented 03/19/2020 with low-grade fever and weakness lower extremities.  Lactic acid 1.6, sodium 130, BUN 98, creatinine 6.40 with latest prior creatinine 1.63 02/24/2020, WBC 12,600, hemoglobin 10.4, CK 20, urinalysis showed RBCs of 21-50 WBC 21-50 protein around 100 bacteria rare hyaline cast, SARS coronavirus negative.  His LFTs were markedly elevated AST 415 ALT 429 total bilirubin 5.5.  CT abdomen pelvis as well as CT cervical spine unremarkable.  MRI cervical spine negative for infection.  Patient was hypotensive and received fluid bolus.  Placed on broad spectrum antibiotics for suspected sepsis.  MRI lumbar spine findings consistent with acute osteomyelitis discitis at L4-5.  Evidence for associated soft tissue infection cellulitis within the psoas musculature bilaterally with a few small superimposed abscesses.  Additional multilevel degenerative spondylosis most pronounced at L3-4.  Neurosurgery consulted no plan for surgical intervention and underwent aspiration of disc space L4-5 with results pending.   TEE showed no obvious vegetation and follow-up per cardiology services.  Orthopedic services consulted Dr. Mardelle Matte for bilateral knee pain with noted swelling of left knee.  X-ray showed diffuse degenerative changes as well as a large joint effusion.   Doppler showed ruptured Baker's cyst on left lower extremity.  Patient underwent left lower extremity joint aspiration 03/21/2020 and right lower extremity joint aspiration 03/22/2020 both showing intracellular calcium pyrophosphate crystals WBC was up to 15,000 culture grew rare E Faecalis.  Patient also did receive steroid injection of both knees.  Underwent I&D bilateral knees 03/24/2020.  Infectious disease continues to follow currently maintained on intravenous ampicillin x7 to 8 weeks through 05/20/2020.  PICC line was placed 03/29/2020.  AKI on CKD stage III continues to improve multifactorial from bacteremia with latest creatinine 1.32.  Normocytic anemia/postoperative anemia patient was transfused 2 units of packed red blood cells latest hemoglobin 7.6.  Therapy evaluations completed and patient was admitted for a comprehensive rehab program  Review of Systems  Constitutional: Positive for fever. Negative for chills.  HENT: Negative for hearing loss.   Eyes: Negative for blurred vision and double vision.  Respiratory: Negative for cough.        Some dyspnea on exertion  Cardiovascular: Positive for palpitations and leg swelling. Negative for chest pain.  Gastrointestinal: Positive for constipation. Negative for heartburn, nausea and vomiting.  Genitourinary: Positive for urgency. Negative for dysuria, flank pain and hematuria.  Musculoskeletal: Positive for joint pain and myalgias.  Skin: Negative for rash.  Neurological: Positive for weakness.  All other systems reviewed and are negative.  Past Medical History:  Diagnosis Date  . Abnormal LFTs (liver function tests) 10/31/2016  . Abnormal nuclear stress test 05/03/2018  . Acid reflux 02/15/2018   not at present  . Acute otitis externa of left ear 11/13/2019  . Acute suppurative otitis media of left ear without spontaneous rupture of tympanic membrane 11/13/2019  . Anemia  low iron  . Aortic stenosis 05/03/2018  . Arthritis 02/15/2018    Overview:  shoulder  . Cancer ([place])    skin cancer on left wrist  . Cervical myelopathy ([place]) 04/22/2019  . Conductive hearing loss of left ear 11/13/2019  . Coronary artery disease involving native coronary artery of native heart without angina pectoris 05/30/2016  . Coronary artery disease of native artery of native heart with stable angina pectoris ([place])    pt. denies  . Diabetes mellitus due to underlying condition with unspecified complications ([place]) [id number]  . Dyslipidemia 05/30/2016  . Dyspnea    upon exertion,  as of 06/05/09 not having any recent chest pain  . Dysrhythmia    a fib  . Essential hypertension 05/30/2016  . Heart murmur    Has had an Aortic valve replacement  . Hyperactive gag reflex 02/15/2018  . Hypothyroidism   . Intermittent claudication ([place]) 10/31/2018  . Lumbar stenosis with neurogenic claudication 06/11/2019  . Macular degeneration 02/15/2018  . PAF (paroxysmal atrial fibrillation) ([place]) 05/30/2016  . Pre-operative cardiovascular examination 01/03/2020  . Preoperative cardiovascular examination 04/17/2019  . Pseudogout of left knee 03/21/2020  . Renal insufficiency 01/17/2020  . S/P aortic valve replacement 01/17/2020  . Tinnitus of left ear 11/13/2019   Past Surgical History:  Procedure Laterality Date  . ANTERIOR CERVICAL DECOMP/DISCECTOMY FUSION N/A 04/22/2019   Procedure: Anterior Cervical Discectomy and Fusion, Cervical four-five, Cervical five-six;  Surgeon: Earnie Larsson, MD;  Location: [place];  Service: Neurosurgery;  Laterality: N/A;  . ANTERIOR CERVICAL DECOMP/DISCECTOMY FUSION N/A 03/02/2020   Procedure: Anterior Cervical Decompression Fusion  - Cervical three-Cervical four;  Surgeon: Earnie Larsson, MD;  Location: [place];  Service: Neurosurgery;  Laterality: N/A;  . AORTIC VALVE REPLACEMENT    . CARDIAC CATHETERIZATION     several stents  . CARDIAC SURGERY    . CORONARY ARTERY BYPASS GRAFT  2017   patient unsure how many, had at [facility]  .  HAND SURGERY    . IRRIGATION AND DEBRIDEMENT KNEE Bilateral 03/24/2020   Procedure: ARTHROSCOPY IRRIGATION AND DEBRIDEMENT BILATERAL  KNEE;  Surgeon: Marchia Bond, MD;  Location: [place];  Service: Orthopedics;  Laterality: Bilateral;  . KNEE SURGERY    . LEFT HEART CATH AND CORONARY ANGIOGRAPHY N/A 01/21/2020   Procedure: LEFT HEART CATH AND CORONARY ANGIOGRAPHY;  Surgeon: Belva Crome, MD;  Location: [place] [facility];  Service: Cardiovascular;  Laterality: N/A;  . LEFT HEART CATH AND CORS/GRAFTS ANGIOGRAPHY N/A 05/16/2018   Procedure: LEFT HEART CATH AND CORS/GRAFTS ANGIOGRAPHY;  Surgeon: Burnell Blanks, MD;  Location: [place] [facility];  Service: Cardiovascular;  Laterality: N/A;  . LUMBAR LAMINECTOMY/DECOMPRESSION MICRODISCECTOMY Bilateral 06/11/2019   Procedure: Bilateral Lumbar Four-FiveLaminectomy/Foraminotomy;  Surgeon: Earnie Larsson, MD;  Location: [place];  Service: Neurosurgery;  Laterality: Bilateral;  posterior  . TEE WITHOUT CARDIOVERSION N/A 03/26/2020   Procedure: TRANSESOPHAGEAL ECHOCARDIOGRAM (TEE);  Surgeon: Jerline Pain, MD;  Location: [facility] ENDOSCOPY;  Service: Cardiovascular;  Laterality: N/A;   Family History  Problem Relation Age of Onset  . Prostate cancer Brother   . Heart disease Maternal Grandmother   . Brain cancer Mother    Social History:  reports that he has never smoked. He has never used smokeless tobacco. He reports previous alcohol use. He reports that he does not use drugs. Allergies:  Allergies  Allergen Reactions  . Atorvastatin Other (See Comments)    Urinary retention   . Nebivolol Diarrhea  Medications Prior to Admission  Medication Sig Dispense Refill  . amiodarone (PACERONE) 400 MG tablet Take 1 tablet (400 mg total) by mouth daily. Take 400 mg for 2 weeks then decrease to 200 mg daily. (Patient taking differently: Take 200 mg by mouth at bedtime. ) 30 tablet 6  . amLODipine (NORVASC) 10 MG tablet Take 10 mg by mouth at bedtime.     Marland Kitchen  apixaban (ELIQUIS) 5 MG TABS tablet Take 1 tablet (5 mg total) by mouth 2 (two) times daily. 180 tablet 3  . benazepril (LOTENSIN) 40 MG tablet Take 40 mg by mouth at bedtime.   3  . Coenzyme Q10 (COQ10) 200 MG CAPS Take 200 mg by mouth daily.     Marland Kitchen ezetimibe (ZETIA) 10 MG tablet Take 1 tablet (10 mg total) by mouth at bedtime. 90 tablet 1  . furosemide (LASIX) 40 MG tablet Take 40 mg by mouth daily.  3  . levothyroxine (SYNTHROID) 25 MCG tablet Take 25 mcg by mouth daily before breakfast.     . metFORMIN (GLUCOPHAGE) 500 MG tablet Take 500 mg by mouth at bedtime.     . nitroGLYCERIN (NITROSTAT) 0.4 MG SL tablet Place 1 tablet (0.4 mg total) under the tongue every 5 (five) minutes as needed for chest pain. 25 tablet 6  . vitamin B-12 (CYANOCOBALAMIN) 1000 MCG tablet Take 1,000 mcg by mouth daily.    . vitamin C (ASCORBIC ACID) 500 MG tablet Take 500 mg by mouth daily.     Marland Kitchen zinc gluconate 50 MG tablet Take 50 mg by mouth daily.    . cyclobenzaprine (FLEXERIL) 10 MG tablet Take 1 tablet (10 mg total) by mouth at bedtime. (Patient not taking: Reported on 01/20/2020) 30 tablet 3    Drug Regimen Review Drug regimen was reviewed and remains appropriate with no significant issues identified  Home: Home Living Family/patient expects to be discharged to:: Private residence Living Arrangements: Spouse/significant other Available Help at Discharge: Family, Available 24 hours/day Type of Home: House Home Access: Stairs to enter [employer] of Steps: 7 Entrance Stairs-Rails: Can reach both Home Layout: One level Bathroom Shower/Tub: Multimedia programmer: Standard Home Equipment: Grab bars - tub/shower, Civil engineer, contracting - built in, [place] - single point, Environmental consultant - 2 wheels   Functional History: Prior Function Level of Independence: Independent Gait / Transfers Assistance Needed: pt reports using RW for very short distance ambulation PTA, states he had very little tolerence with  periodic L knee buckling. for the first week after surgery, pt reports he was up with cane and felt he really didn't even need taht. ADL's / Homemaking Assistance Needed: Pt's wife assisted pt with getting up in the morning, then wife would go to work so pt was home alone during the day. 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  Functional Status:  Mobility: Bed Mobility Overal bed mobility: Needs Assistance Bed Mobility: Sit to Sidelying Sidelying to sit: Mod assist Supine to sit: Min guard, HOB elevated Sit to supine: Supervision, HOB elevated Sit to sidelying: Mod assist General bed mobility comments: Mod assist to help LEs onto bed Transfers Overall transfer level: Needs assistance Equipment used: Rolling walker (2 wheeled) Transfers: Sit to/from Stand Sit to Stand: +2 physical assistance, Mod assist  Lateral/Scoot Transfers: Min assist, +2 physical assistance, +2 safety/equipment General transfer comment: stood from recliner with bilateral support; slow rise, painful Ambulation/Gait Ambulation/Gait assistance: Min assist, +2 safety/equipment Gait Distance (Feet): (pivotal steps recliner back to bed)  Assistive device: Rolling walker (2 wheeled) Gait Pattern/deviations: Decreased step length - right, Decreased step length - left, Trunk flexed General Gait Details: Slow, small steps to turn to get to bed Gait velocity: significantly decreased    ADL: ADL Overall ADL's : Needs assistance/impaired Eating/Feeding: Independent Grooming: Wash/dry hands, Wash/dry face, Set up, Sitting, With caregiver independent assisting Upper Body Bathing: Set up, Supervision/ safety, Sitting, With caregiver independent assisting Lower Body Bathing: Maximal assistance Upper Body Dressing : Set up, Supervision/safety, Sitting, With caregiver independent assisting Lower Body Dressing: Minimal assistance, Sitting/lateral leans Lower Body Dressing Details (indicate cue type and reason): Pt able  to don socks seated in recliner, flexing L knee up to chair Toilet Transfer: Minimal assistance, +2 for physical assistance, Cueing for safety, Cueing for sequencing, Ambulation, RW Toilet Transfer Details (indicate cue type and reason): simulated to recliner. Pt couldn't quite make it ambulating towards bathroom with RW due to B UEs fatiguing Toileting- Clothing Manipulation and Hygiene: Minimal assistance, Sit to/from stand Functional mobility during ADLs: Minimal assistance, +2 for physical assistance, Rolling walker, Cueing for safety, Cueing for sequencing General ADL Comments: Provided UE theraband and education, discussed CIR  Cognition: Cognition Overall Cognitive Status: Within Functional Limits for tasks assessed Orientation Level: Oriented to person, Oriented to place( April 2001, unsure why here, knew he was @ clinic/hosp) Cognition Arousal/Alertness: Awake/alert Behavior During Therapy: WFL for tasks assessed/performed Overall Cognitive Status: Within Functional Limits for tasks assessed Area of Impairment: Safety/judgement Safety/Judgement: Decreased awareness of safety, Decreased awareness of deficits General Comments: Generally unhappy with his progress  Physical Exam: Blood pressure 115/65, pulse 87, temperature 98.2 F (36.8 C), temperature source Oral, resp. rate 16, height 5\' 10"  (1.778 m), weight 82.5 kg, SpO2 100 %. Physical Exam  Constitutional: He is oriented to person, place, and time. No distress.  HENT:  Head: Normocephalic.  Eyes: Pupils are equal, round, and reactive to light. EOM are normal.  Cardiovascular: Normal rate.  Murmur heard. Respiratory: Effort normal. No respiratory distress. He has no wheezes.  GI: Soft.  Musculoskeletal:     Cervical back: Normal range of motion.     Comments: Both knees swollen and tender. Low back TTP.   Neurological: He is alert and oriented to person, place, and time. No cranial nerve deficit.  UE motor 5/5. LE 3-/5  prox (pain inhibition from back/knees) to 4/5 with ADF/PF. No focal sensory loss. DTR's 1+  Skin: Skin is warm. He is not diaphoretic.  Psychiatric: He has a normal mood and affect. His behavior is normal.    Results for orders placed or performed during the hospital encounter of 03/19/20 (from the past 48 hour(s))  Glucose, capillary     Status: Abnormal   Collection Time: 04/05/20 11:47 AM  Result Value Ref Range   Glucose-Capillary 206 (H) 70 - 99 mg/dL    Comment: Glucose reference range applies only to samples taken after fasting for at least 8 hours.  Glucose, capillary     Status: Abnormal   Collection Time: 04/05/20  4:52 PM  Result Value Ref Range   Glucose-Capillary 209 (H) 70 - 99 mg/dL    Comment: Glucose reference range applies only to samples taken after fasting for at least 8 hours.  Glucose, capillary     Status: None   Collection Time: 04/05/20  8:50 PM  Result Value Ref Range   Glucose-Capillary 91 70 - 99 mg/dL    Comment: Glucose reference range applies only to samples taken after fasting  for at least 8 hours.  Type and screen [place]     Status: None   Collection Time: 04/06/20  4:29 AM  Result Value Ref Range   ABO/RH(D) A POS    Antibody Screen NEG    Sample Expiration      04/09/2020,2359 Performed at [facility], [place] [address], [place], [place] [address]   CBC with Differential/Platelet     Status: Abnormal   Collection Time: 04/06/20  4:38 AM  Result Value Ref Range   WBC 9.7 4.0 - 10.5 K/uL   RBC 2.47 (L) 4.22 - 5.81 MIL/uL   Hemoglobin 7.4 (L) 13.0 - 17.0 g/dL   HCT 23.3 (L) 39.0 - 52.0 %   MCV 94.3 80.0 - 100.0 fL   MCH 30.0 26.0 - 34.0 pg   MCHC 31.8 30.0 - 36.0 g/dL   RDW 15.4 11.5 - 15.5 %   Platelets 308 150 - 400 K/uL   nRBC 0.0 0.0 - 0.2 %   Neutrophils Relative % 79 %   Neutro Abs 7.8 (H) 1.7 - 7.7 K/uL   Lymphocytes Relative 12 %   Lymphs Abs 1.1 0.7 - 4.0 K/uL   Monocytes Relative 6 %   Monocytes  Absolute 0.6 0.1 - 1.0 K/uL   Eosinophils Relative 1 %   Eosinophils Absolute 0.1 0.0 - 0.5 K/uL   Basophils Relative 1 %   Basophils Absolute 0.1 0.0 - 0.1 K/uL   Immature Granulocytes 1 %   Abs Immature Granulocytes 0.06 0.00 - 0.07 K/uL    Comment: Performed at [facility] [address], [place], [place] [id number]  Basic metabolic panel     Status: Abnormal   Collection Time: 04/06/20  4:38 AM  Result Value Ref Range   Sodium 136 135 - 145 mmol/L   Potassium 4.1 3.5 - 5.1 mmol/L   Chloride 101 98 - 111 mmol/L   CO2 28 22 - 32 mmol/L   Glucose, Bld 156 (H) 70 - 99 mg/dL    Comment: Glucose reference range applies only to samples taken after fasting for at least 8 hours.   BUN 18 8 - 23 mg/dL   Creatinine, Ser 1.31 (H) 0.61 - 1.24 mg/dL   Calcium 8.8 (L) 8.9 - 10.3 mg/dL   GFR calc non Af Amer 56 (L) >60 mL/min   GFR calc Af Amer >60 >60 mL/min   Anion gap 7 5 - 15    Comment: Performed at [facility] [address] [place], [place] [address]  Glucose, capillary     Status: Abnormal   Collection Time: 04/06/20  7:08 AM  Result Value Ref Range   Glucose-Capillary 142 (H) 70 - 99 mg/dL    Comment: Glucose reference range applies only to samples taken after fasting for at least 8 hours.  Glucose, capillary     Status: Abnormal   Collection Time: 04/06/20 11:23 AM  Result Value Ref Range   Glucose-Capillary 119 (H) 70 - 99 mg/dL    Comment: Glucose reference range applies only to samples taken after fasting for at least 8 hours.  Glucose, capillary     Status: Abnormal   Collection Time: 04/06/20  4:06 PM  Result Value Ref Range   Glucose-Capillary 200 (H) 70 - 99 mg/dL    Comment: Glucose reference range applies only to samples taken after fasting for at least 8 hours.  Glucose, capillary     Status: Abnormal  Collection Time: 04/06/20  8:53 PM  Result Value Ref Range   Glucose-Capillary 214 (H) 70 - 99 mg/dL    Comment: Glucose reference range applies only  to samples taken after fasting for at least 8 hours.  CBC with Differential/Platelet     Status: Abnormal   Collection Time: 04/07/20  4:32 AM  Result Value Ref Range   WBC 7.7 4.0 - 10.5 K/uL   RBC 2.54 (L) 4.22 - 5.81 MIL/uL   Hemoglobin 7.6 (L) 13.0 - 17.0 g/dL   HCT 23.9 (L) 39.0 - 52.0 %   MCV 94.1 80.0 - 100.0 fL   MCH 29.9 26.0 - 34.0 pg   MCHC 31.8 30.0 - 36.0 g/dL   RDW 15.5 11.5 - 15.5 %   Platelets 340 150 - 400 K/uL   nRBC 0.0 0.0 - 0.2 %   Neutrophils Relative % 78 %   Neutro Abs 6.0 1.7 - 7.7 K/uL   Lymphocytes Relative 14 %   Lymphs Abs 1.1 0.7 - 4.0 K/uL   Monocytes Relative 4 %   Monocytes Absolute 0.3 0.1 - 1.0 K/uL   Eosinophils Relative 2 %   Eosinophils Absolute 0.2 0.0 - 0.5 K/uL   Basophils Relative 1 %   Basophils Absolute 0.1 0.0 - 0.1 K/uL   Immature Granulocytes 1 %   Abs Immature Granulocytes 0.06 0.00 - 0.07 K/uL    Comment: Performed at [facility] [address], [place], [place] [id number]  Basic metabolic panel     Status: Abnormal   Collection Time: 04/07/20  4:32 AM  Result Value Ref Range   Sodium 136 135 - 145 mmol/L   Potassium 4.2 3.5 - 5.1 mmol/L   Chloride 101 98 - 111 mmol/L   CO2 26 22 - 32 mmol/L   Glucose, Bld 175 (H) 70 - 99 mg/dL    Comment: Glucose reference range applies only to samples taken after fasting for at least 8 hours.   BUN 21 8 - 23 mg/dL   Creatinine, Ser 1.32 (H) 0.61 - 1.24 mg/dL   Calcium 9.0 8.9 - 10.3 mg/dL   GFR calc non Af Amer 55 (L) >60 mL/min   GFR calc Af Amer >60 >60 mL/min   Anion gap 9 5 - 15    Comment: Performed at [facility] [address], [place], [place] [address]  Glucose, capillary     Status: Abnormal   Collection Time: 04/07/20  6:46 AM  Result Value Ref Range   Glucose-Capillary 149 (H) 70 - 99 mg/dL    Comment: Glucose reference range applies only to samples taken after fasting for at least 8 hours.   No results found.     Medical Problem List and Plan: 1.   Decreased functional mobility secondary to sepsis/Enterococcus faecalis bacteremia.  -patient may shower  -ELOS/Goals: 12-14 days, supervision to mod I 2.  Antithrombotics: -DVT/anticoagulation:  Eliquis 5mg  BID  -antiplatelet therapy: N/A 3. Pain Management: Flexeril as needed, oxycodone as needed  -heat, kpad as appropriate, lumbar corset, ice for knees.  -ROM, core strengthening with therapies 4. Mood: Trazodone as needed  -antipsychotic agents: N/C 5. Neuropsych: This patient is capable of making decisions on his own behalf. 6. Skin/Wound Care: Routine skin checks 7. Fluids/Electrolytes/Nutrition: Routine in and outs with follow-up chemistries 8.  Acute osteomyelitis discitis L4-5.  Neurosurgery follow-up no surgical intervention.  Lumbar corset for comfort.   -S/p FNA biopsy L4-5 disc today.  Await aspiration results  9.  Recent C3-4 ACDF 03/02/2020 per [name] for cervical myelopathy.  Cervical collar has been discontinued. 10.  Septic knee.  Status post aspiration and steroid injection with I&D of bilateral knees 03/24/2020.  Follow-up orthopedic services 11.  ID/Enterococcus bacteremia.  Continue intravenous ampicillin through 05/20/2020.  Follow-up infectious disease 12.  PAF/bioprosthetic aortic valve replacement 2017.  Await plan to resume Eliquis.  Amiodarone currently on hold 13.  AKI.  Follow-up chemistries 14.  Diabetes mellitus.  Hemoglobin A1c 6.8.  Currently on NovoLog 3 units 3 times daily as well as Lantus insulin 10 units twice daily.  Patient on Glucophage prior to admission 500 mg nightly.  Currently on hold due to AKI. 15.  Acute on chronic anemia.  Continue Niferex.  Follow-up CBC 16.  Hypothyroidism.  Synthroid     Lavon Paganini Angiulli, PA-C 04/07/2020

## 2020-04-08 ENCOUNTER — Inpatient Hospital Stay (HOSPITAL_COMMUNITY): Payer: BC Managed Care – PPO | Admitting: Occupational Therapy

## 2020-04-08 ENCOUNTER — Encounter (HOSPITAL_COMMUNITY): Payer: Self-pay | Admitting: Physical Medicine and Rehabilitation

## 2020-04-08 ENCOUNTER — Inpatient Hospital Stay (HOSPITAL_COMMUNITY): Payer: BC Managed Care – PPO

## 2020-04-08 DIAGNOSIS — I4892 Unspecified atrial flutter: Secondary | ICD-10-CM

## 2020-04-08 DIAGNOSIS — M4647 Discitis, unspecified, lumbosacral region: Secondary | ICD-10-CM | POA: Diagnosis not present

## 2020-04-08 DIAGNOSIS — R5381 Other malaise: Secondary | ICD-10-CM | POA: Diagnosis not present

## 2020-04-08 DIAGNOSIS — A4181 Sepsis due to Enterococcus: Secondary | ICD-10-CM | POA: Diagnosis not present

## 2020-04-08 DIAGNOSIS — M4640 Discitis, unspecified, site unspecified: Secondary | ICD-10-CM | POA: Diagnosis not present

## 2020-04-08 DIAGNOSIS — M4646 Discitis, unspecified, lumbar region: Secondary | ICD-10-CM | POA: Diagnosis not present

## 2020-04-08 DIAGNOSIS — M4626 Osteomyelitis of vertebra, lumbar region: Secondary | ICD-10-CM | POA: Diagnosis not present

## 2020-04-08 DIAGNOSIS — I330 Acute and subacute infective endocarditis: Secondary | ICD-10-CM | POA: Diagnosis not present

## 2020-04-08 DIAGNOSIS — M1712 Unilateral primary osteoarthritis, left knee: Secondary | ICD-10-CM | POA: Diagnosis not present

## 2020-04-08 DIAGNOSIS — I480 Paroxysmal atrial fibrillation: Secondary | ICD-10-CM | POA: Diagnosis not present

## 2020-04-08 DIAGNOSIS — I483 Typical atrial flutter: Secondary | ICD-10-CM | POA: Diagnosis not present

## 2020-04-08 LAB — GLUCOSE, CAPILLARY
Glucose-Capillary: 196 mg/dL — ABNORMAL HIGH (ref 70–99)
Glucose-Capillary: 188 mg/dL — ABNORMAL HIGH (ref 70–99)
Glucose-Capillary: 148 mg/dL — ABNORMAL HIGH (ref 70–99)
Glucose-Capillary: 175 mg/dL — ABNORMAL HIGH (ref 70–99)

## 2020-04-08 LAB — COMPREHENSIVE METABOLIC PANEL
ALT: 94 U/L — ABNORMAL HIGH (ref 0–44)
AST: 60 U/L — ABNORMAL HIGH (ref 15–41)
Albumin: 1.9 g/dL — ABNORMAL LOW (ref 3.5–5.0)
Alkaline Phosphatase: 106 U/L (ref 38–126)
Anion gap: 10 (ref 5–15)
BUN: 22 mg/dL (ref 8–23)
CO2: 25 mmol/L (ref 22–32)
Calcium: 9 mg/dL (ref 8.9–10.3)
Chloride: 100 mmol/L (ref 98–111)
Creatinine, Ser: 1.6 mg/dL — ABNORMAL HIGH (ref 0.61–1.24)
GFR calc Af Amer: 51 mL/min — ABNORMAL LOW (ref >60)
GFR calc non Af Amer: 44 mL/min — ABNORMAL LOW (ref >60)
Glucose, Bld: 226 mg/dL — ABNORMAL HIGH (ref 70–99)
Potassium: 4 mmol/L (ref 3.5–5.1)
Sodium: 135 mmol/L (ref 135–145)
Total Bilirubin: 1.2 mg/dL (ref 0.3–1.2)
Total Protein: 6.2 g/dL — ABNORMAL LOW (ref 6.5–8.1)

## 2020-04-08 LAB — CBC WITH DIFFERENTIAL/PLATELET
Abs Immature Granulocytes: 0.08 10*3/uL — ABNORMAL HIGH (ref 0.00–0.07)
Basophils Absolute: 0 10*3/uL (ref 0.0–0.1)
Basophils Relative: 0 %
Eosinophils Absolute: 0 10*3/uL (ref 0.0–0.5)
Eosinophils Relative: 0 %
HCT: 24.6 % — ABNORMAL LOW (ref 39.0–52.0)
Hemoglobin: 7.7 g/dL — ABNORMAL LOW (ref 13.0–17.0)
Immature Granulocytes: 1 %
Lymphocytes Relative: 5 %
Lymphs Abs: 0.5 10*3/uL — ABNORMAL LOW (ref 0.7–4.0)
MCH: 29.2 pg (ref 26.0–34.0)
MCHC: 31.3 g/dL (ref 30.0–36.0)
MCV: 93.2 fL (ref 80.0–100.0)
Monocytes Absolute: 0.3 10*3/uL (ref 0.1–1.0)
Monocytes Relative: 3 %
Neutro Abs: 9.5 10*3/uL — ABNORMAL HIGH (ref 1.7–7.7)
Neutrophils Relative %: 91 %
Platelets: 330 10*3/uL (ref 150–400)
RBC: 2.64 MIL/uL — ABNORMAL LOW (ref 4.22–5.81)
RDW: 15.6 % — ABNORMAL HIGH (ref 11.5–15.5)
WBC: 10.5 10*3/uL (ref 4.0–10.5)
nRBC: 0 % (ref 0.0–0.2)

## 2020-04-08 LAB — SYNOVIAL CELL COUNT + DIFF, W/ CRYSTALS
Eosinophils-Synovial: 0 % (ref 0–1)
Lymphocytes-Synovial Fld: 0 % (ref 0–20)
Monocyte-Macrophage-Synovial Fluid: 3 % — ABNORMAL LOW (ref 50–90)
Neutrophil, Synovial: 97 % — ABNORMAL HIGH (ref 0–25)
WBC, Synovial: 26700 /mm3 — ABNORMAL HIGH (ref 0–200)

## 2020-04-08 LAB — MAGNESIUM: Magnesium: 2 mg/dL (ref 1.7–2.4)

## 2020-04-08 LAB — GRAM STAIN

## 2020-04-08 LAB — TSH: TSH: 4.097 u[IU]/mL (ref 0.350–4.500)

## 2020-04-08 MED ORDER — AMLODIPINE BESYLATE 10 MG PO TABS
10.0000 mg | ORAL_TABLET | Freq: Every day | ORAL | Status: DC
  Administered 2020-04-08: 10 mg via ORAL
  Filled 2020-04-08: qty 1

## 2020-04-08 MED ORDER — VITAMIN B-12 1000 MCG PO TABS
1000.0000 ug | ORAL_TABLET | Freq: Every day | ORAL | Status: DC
  Administered 2020-04-08 – 2020-04-13 (×6): 1000 ug via ORAL
  Filled 2020-04-08 (×6): qty 1

## 2020-04-08 MED ORDER — METOPROLOL TARTRATE 25 MG PO TABS
25.0000 mg | ORAL_TABLET | Freq: Four times a day (QID) | ORAL | Status: DC
  Filled 2020-04-08: qty 1

## 2020-04-08 MED ORDER — PREGABALIN 50 MG PO CAPS
50.0000 mg | ORAL_CAPSULE | Freq: Two times a day (BID) | ORAL | Status: DC
  Administered 2020-04-08 – 2020-04-09 (×3): 50 mg via ORAL
  Filled 2020-04-08 (×3): qty 1

## 2020-04-08 MED ORDER — BUPIVACAINE HCL (PF) 0.5 % IJ SOLN
10.0000 mL | Freq: Once | INTRAMUSCULAR | Status: AC
  Administered 2020-04-08: 10 mL
  Filled 2020-04-08: qty 10

## 2020-04-08 MED ORDER — SODIUM CHLORIDE 0.9 % IV SOLN
2.0000 g | Freq: Two times a day (BID) | INTRAVENOUS | Status: DC
  Administered 2020-04-08 – 2020-04-13 (×11): 2 g via INTRAVENOUS
  Filled 2020-04-08: qty 2
  Filled 2020-04-08 (×2): qty 20
  Filled 2020-04-08: qty 2
  Filled 2020-04-08: qty 20
  Filled 2020-04-08: qty 2
  Filled 2020-04-08 (×4): qty 20
  Filled 2020-04-08: qty 2
  Filled 2020-04-08: qty 20
  Filled 2020-04-08 (×2): qty 2

## 2020-04-08 MED ORDER — METHYLPREDNISOLONE ACETATE 80 MG/ML IJ SUSP
80.0000 mg | Freq: Once | INTRAMUSCULAR | Status: DC
  Filled 2020-04-08: qty 1

## 2020-04-08 MED ORDER — AMIODARONE HCL 200 MG PO TABS
400.0000 mg | ORAL_TABLET | Freq: Two times a day (BID) | ORAL | Status: DC
  Administered 2020-04-08 – 2020-04-11 (×6): 400 mg via ORAL
  Filled 2020-04-08 (×6): qty 2

## 2020-04-08 MED ORDER — CHLORHEXIDINE GLUCONATE CLOTH 2 % EX PADS
6.0000 | MEDICATED_PAD | Freq: Two times a day (BID) | CUTANEOUS | Status: DC
  Administered 2020-04-08 – 2020-04-13 (×11): 6 via TOPICAL

## 2020-04-08 MED ORDER — ASCORBIC ACID 500 MG PO TABS
500.0000 mg | ORAL_TABLET | Freq: Every day | ORAL | Status: DC
  Administered 2020-04-08 – 2020-04-13 (×6): 500 mg via ORAL
  Filled 2020-04-08 (×6): qty 1

## 2020-04-08 MED ORDER — SODIUM CHLORIDE 0.9% FLUSH
10.0000 mL | INTRAVENOUS | Status: DC | PRN
  Administered 2020-04-12: 10 mL

## 2020-04-08 NOTE — Significant Event (Addendum)
Rapid Response Event Note  Overview: Cardiac - AF RVR  Initial Focused Assessment: Called by Rehab staff with concerns of patient's HR being elevated and irregular. Patient has a history of AF since his AVR years ago, he states that that he was weaned off the AMIO and did well for years. More recently he was put back on for re-occurence of AF - but currently it was held to d/t worsening of his renal and liver function.   Patient was alert and oriented, seems fatigued and somewhat frustrated with being back in AF. Skin very warm to touch, febrile - temperature 100.1 (oral), he endorses 8/10 LT back pain. Lung sounds - clear throughout, 100% on RA - RR 16 - effort normal. He does endorse that at times he feels short of breath when his HR is high but currently denies CP and/or SOB. LT knee quite swollen in comparison to RT knee.   I was called to assist in getting Cardiology on board as well - I did page the CARDS Master and updated her as well.  Interventions: -- EKG ordered  Plan of Care: -- Treat fever and/or pain as needed  -- I encourage him to still try to participate with PT/OT as long as the medical team deemed it was okay.  -- Rest per medical team and cardiology team  Event Summary:  Call Time 1103 Arrival Time 1107 End Time [id number]  Daesha Insco R

## 2020-04-08 NOTE — Discharge Instructions (Signed)
Inpatient Rehab Discharge Instructions  Johnathan Murray [facility] Discharge date and time: No discharge date for patient encounter.   Activities/Precautions/ Functional Status: Activity: activity as tolerated Diet: diabetic diet Wound Care: none needed Functional status:  ___ No restrictions     ___ Walk up steps independently ___ 24/7 supervision/assistance   ___ Walk up steps with assistance ___ Intermittent supervision/assistance  ___ Bathe/dress independently ___ Walk with walker     _x__ Bathe/dress with assistance ___ Walk Independently    ___ Shower independently ___ Walk with assistance    ___ Shower with assistance ___ No alcohol     ___ Return to work/school ________  Special Instructions: No driving smoking or alcohol   Intravenous ampicillin 2 g every 6 hours through 05/20/2020 per Dr. Michel Bickers   My questions have been answered and I understand these instructions. I will adhere to these goals and the provided educational materials after my discharge from the hospital.  Patient/Caregiver Signature _______________________________ Date __________  Clinician Signature _______________________________________ Date __________  Please bring this form and your medication list with you to all your follow-up doctor's appointments.

## 2020-04-08 NOTE — Progress Notes (Signed)
   04/08/20 0650  Assess: MEWS Score  Temp 98.7 F (37.1 C)  BP 100/66  Pulse Rate (!) 135  Resp 18  Level of Consciousness Alert  SpO2 100 %  O2 Device Room Air  Patient Activity (if Appropriate) In bed  Assess: MEWS Score  MEWS Temp 0  MEWS Systolic 1  MEWS Pulse 3  MEWS RR 0  MEWS LOC 0  MEWS Score 4  MEWS Score Color Red  Assess: if the MEWS score is Yellow or Red  Were vital signs taken at a resting state? Yes  Focused Assessment Documented focused assessment  Early Detection of Sepsis Score *See Row Information* Low  MEWS guidelines implemented *See Row Information* Yes  Treat  MEWS Interventions Escalated (See documentation below);Other (Comment) (Provider to consult cardiology)  Take Vital Signs  Increase Vital Sign Frequency  Red: Q 1hr X 4 then Q 4hr X 4, if remains red, continue Q 4hrs  Escalate  MEWS: Escalate Red: discuss with charge nurse/RN and provider, consider discussing with RRT  Notify: Charge Nurse/RN  Name of Charge Nurse/RN Notified Jeanie Cooks  Date Charge Nurse/RN Notified 04/08/20  Time Charge Nurse/RN Notified [phone]  Notify: Provider  Provider Name/Title Marlowe Shores  Date Provider Notified 04/08/20  Time Provider Notified [phone]  Notification Type Face-to-face  Notification Reason Change in status;Requested by patient/family  Response No new orders;Other (Comment) (Provider to consult cardiology)  Date of Provider Response 04/08/20  Time of Provider Response [phone]  Document  Patient Outcome Not stable and remains on department;Other (Comment)  Progress note created (see row info) Yes   Patient's spouse alerted Nurse Tech that patient may possibly be in Afib, and requested to re-check vitals. Nurse Tech called this Nurse into room to assess patient. Patient is currently in Afib, but has a history of Afib since 2017. Patient stated that he feels fine at the moment and is 'use to it.' Patient's spouse is worried and questioned if patient is  currently on Amiodarone to treat it (on Eliquis at the moment, while Amiodarone is being held). Charge nurse Lysle Morales and P.A. Dan were both notified, and Red MEWs protocol was started. Dan went to room to address the issue with patient and spouse, and will consult Cardiology.

## 2020-04-08 NOTE — Progress Notes (Signed)
Patient with history of PAF.  His Eliquis is ongoing however amiodarone recently on hold due to acute renal failure creatinine much improved.  Heart rate this morning 135 patient in no acute distress.  Cardiology service was consulted in regards to resuming amiodarone.

## 2020-04-08 NOTE — Progress Notes (Signed)
Inpatient Rehabilitation Care Coordinator Assessment and Plan Inpatient Rehabilitation Care Coordinator Assessment and Plan  Patient Details  Name: Johnathan Murray MRN: [id number] Date of Birth: [birth year]  Today's Date: 04/08/2020  Problem List:  Patient Active Problem List   Diagnosis Date Noted  . Discitis 04/07/2020  . Protein-calorie malnutrition, severe 03/24/2020  . Septic arthritis ([place]) 03/23/2020  . History of fusion of cervical spine 03/23/2020  . Enterococcal bacteremia 03/21/2020  . Pseudogout of left knee 03/21/2020  . ARF (acute renal failure) ([place]) 03/20/2020  . S/P aortic valve replacement 01/17/2020  . Renal insufficiency 01/17/2020  . Pre-operative cardiovascular examination 01/03/2020  . Acute otitis externa of left ear 11/13/2019  . Acute suppurative otitis media of left ear without spontaneous rupture of tympanic membrane 11/13/2019  . Conductive hearing loss of left ear 11/13/2019  . Tinnitus of left ear 11/13/2019  . Lumbar stenosis with neurogenic claudication 06/11/2019  . Cervical myelopathy ([place]) 04/22/2019  . Preoperative cardiovascular examination 04/17/2019  . Intermittent claudication ([place]) 10/31/2018  . Coronary artery disease of native artery of native heart with stable angina pectoris ([place])   . Abnormal nuclear stress test 05/03/2018  . Aortic stenosis 05/03/2018  . Acid reflux 02/15/2018  . Arthritis 02/15/2018  . Hyperactive gag reflex 02/15/2018  . Macular degeneration 02/15/2018  . Abnormal LFTs (liver function tests) 10/31/2016  . Coronary artery disease involving native coronary artery of native heart without angina pectoris 05/30/2016  . Diabetes mellitus due to underlying condition with unspecified complications ([place]) 09/98/3382  . Dyslipidemia 05/30/2016  . Essential hypertension 05/30/2016  . PAF (paroxysmal atrial fibrillation) ([place]) 05/30/2016   Past Medical History:  Past Medical History:  Diagnosis Date  . Abnormal LFTs  (liver function tests) 10/31/2016  . Abnormal nuclear stress test 05/03/2018  . Acid reflux 02/15/2018   not at present  . Acute otitis externa of left ear 11/13/2019  . Acute suppurative otitis media of left ear without spontaneous rupture of tympanic membrane 11/13/2019  . Anemia    low iron  . Aortic stenosis 05/03/2018  . Arthritis 02/15/2018   Overview:  shoulder  . Cancer ([place])    skin cancer on left wrist  . Cervical myelopathy ([place]) 04/22/2019  . Conductive hearing loss of left ear 11/13/2019  . Coronary artery disease involving native coronary artery of native heart without angina pectoris 05/30/2016  . Coronary artery disease of native artery of native heart with stable angina pectoris ([place])    pt. denies  . Diabetes mellitus due to underlying condition with unspecified complications ([place]) 03/26/3975  . Dyslipidemia 05/30/2016  . Dyspnea    upon exertion,  as of 06/05/09 not having any recent chest pain  . Dysrhythmia    a fib  . Essential hypertension 05/30/2016  . Heart murmur    Has had an Aortic valve replacement  . Hyperactive gag reflex 02/15/2018  . Hypothyroidism   . Intermittent claudication ([place]) 10/31/2018  . Lumbar stenosis with neurogenic claudication 06/11/2019  . Macular degeneration 02/15/2018  . PAF (paroxysmal atrial fibrillation) ([place]) 05/30/2016  . Pre-operative cardiovascular examination 01/03/2020  . Preoperative cardiovascular examination 04/17/2019  . Pseudogout of left knee 03/21/2020  . Renal insufficiency 01/17/2020  . S/P aortic valve replacement 01/17/2020  . Tinnitus of left ear 11/13/2019   Past Surgical History:  Past Surgical History:  Procedure Laterality Date  . ANTERIOR CERVICAL DECOMP/DISCECTOMY FUSION N/A 04/22/2019   Procedure: Anterior Cervical Discectomy and Fusion, Cervical four-five, Cervical five-six;  Surgeon: Earnie Larsson,  MD;  Location: [facility];  Service: Neurosurgery;  Laterality: N/A;  . ANTERIOR CERVICAL DECOMP/DISCECTOMY FUSION N/A  03/02/2020   Procedure: Anterior Cervical Decompression Fusion  - Cervical three-Cervical four;  Surgeon: Earnie Larsson, MD;  Location: [facility];  Service: Neurosurgery;  Laterality: N/A;  . AORTIC VALVE REPLACEMENT    . CARDIAC CATHETERIZATION     several stents  . CARDIAC SURGERY    . CORONARY ARTERY BYPASS GRAFT  2017   patient unsure how many, had at [facility]  . HAND SURGERY    . IR LUMBAR [place] W/IMG GUIDE  04/07/2020  . IRRIGATION AND DEBRIDEMENT KNEE Bilateral 03/24/2020   Procedure: ARTHROSCOPY IRRIGATION AND DEBRIDEMENT BILATERAL  KNEE;  Surgeon: Marchia Bond, MD;  Location: [facility];  Service: Orthopedics;  Laterality: Bilateral;  . KNEE SURGERY    . LEFT HEART CATH AND CORONARY ANGIOGRAPHY N/A 01/21/2020   Procedure: LEFT HEART CATH AND CORONARY ANGIOGRAPHY;  Surgeon: Belva Crome, MD;  Location: [facility] [facility];  Service: Cardiovascular;  Laterality: N/A;  . LEFT HEART CATH AND CORS/GRAFTS ANGIOGRAPHY N/A 05/16/2018   Procedure: LEFT HEART CATH AND CORS/GRAFTS ANGIOGRAPHY;  Surgeon: Burnell Blanks, MD;  Location: [facility];  Service: Cardiovascular;  Laterality: N/A;  . LUMBAR LAMINECTOMY/DECOMPRESSION MICRODISCECTOMY Bilateral 06/11/2019   Procedure: Bilateral Lumbar Four-FiveLaminectomy/Foraminotomy;  Surgeon: Earnie Larsson, MD;  Location: [facility];  Service: Neurosurgery;  Laterality: Bilateral;  posterior  . TEE WITHOUT CARDIOVERSION N/A 03/26/2020   Procedure: TRANSESOPHAGEAL ECHOCARDIOGRAM (TEE);  Surgeon: Jerline Pain, MD;  Location[facility];  Service: Cardiovascular;  Laterality: N/A;   Social History:  reports that he has never smoked. He has never used smokeless tobacco. He reports previous alcohol use. He reports that he does not use drugs.  Family / Support Systems Marital Status: Married How Long?: 60 years Patient Roles: Spouse Spouse/Significant Other: [name] (wife): [phone] Children: Adult daughter Other Supports:  N/A Anticipated Caregiver: Wife- intends to take FMLA Ability/Limitations of Caregiver: None reported Caregiver Availability: 24/7 Family Dynamics: Pt lives with wife, and was working as a Administrator until incident occurred.  Social History Preferred language: English Religion: None Cultural Background: Pt has worked as a Administrator for 40 years Education: GED Read: Yes Write: Yes Employment Status: Retired Public relations account executive Issues: Denies Guardian/Conservator: N/A   Abuse/Neglect Abuse/Neglect Assessment Can Be Completed: Yes Physical Abuse: Denies Verbal Abuse: Denies Sexual Abuse: Denies Exploitation of patient/patient's resources: Denies Self-Neglect: Denies  Emotional Status Pt's affect, behavior and adjustment status: Pt in good spirits. Pt recognizes that he is overwhlemed about this medical event occurring, and general issues surrounding family. Pt is open to meeting with neuropsych. Recent Psychosocial Issues: See above Psychiatric History: Denies Substance Abuse History: Denies. Quit EtOH (beer) 6 months ago at the request of his wife.  Patient / Family Perceptions, Expectations & Goals Pt/Family understanding of illness & functional limitations: Pt and pt wife have a general understanding of his medical condition Premorbid pt/family roles/activities: Independent Anticipated changes in roles/activities/participation: Assistance with ADLs/IADLs Pt/family expectations/goals: Build Consulting civil engineer: None Premorbid Home Care/DME Agencies: None Transportation available at discharge: wife  Discharge Planning Living Arrangements: Spouse/significant other Support Systems: Spouse/significant other, Children Type of Residence: Private residence Insurance Resources: [employer], Multimedia programmer (specify)(Medicare Part A and Nurse, mental health) Financial Resources: Employment Financial Screen Referred: No Living Expenses: Own Money  Management: Patient, Spouse Does the patient have any problems obtaining your medications?: No Care Coordinator Barriers  to Discharge: Decreased caregiver support, Lack of/limited family support Care Coordinator Barriers to Discharge Comments: Wife will be primary caregiver with intermittent support from dtr when she is available. Care Coordinator Anticipated Follow Up Needs: HH/OP  Clinical Impression SW met with pt and pt wife in room to introduce self, explain role, and discuss discharge process. Pt wife has HCPOA forms and will bring them in. 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 DME: RW, crutches, and cane.   Addeline Calarco A Shelly Spenser 04/08/2020, 3:19 PM

## 2020-04-08 NOTE — Progress Notes (Addendum)
Occupational Therapy Assessment and Plan  Patient Details  Name: Dysen Edmondson MRN: [id number] Date of Birth: 05-22-1951  OT Diagnosis: abnormal posture, acute pain, lumbago (low back pain), muscle weakness (generalized) and pain in joint ELOS: 2-2.5 weeks  Today's Date: 04/08/2020 OT Individual Time: 0802-0900 OT Individual Time Calculation (min): 58 min     OT Individual Time: 4098-1191 OT Individual Time Calculation (min): 42 min    Problem List:  Patient Active Problem List   Diagnosis Date Noted  . Discitis 04/07/2020  . Protein-calorie malnutrition, severe 03/24/2020  . Septic arthritis ([place]) 03/23/2020  . History of fusion of cervical spine 03/23/2020  . Enterococcal bacteremia 03/21/2020  . Pseudogout of left knee 03/21/2020  . ARF (acute renal failure) ([place]) 03/20/2020  . S/P aortic valve replacement 01/17/2020  . Renal insufficiency 01/17/2020  . Pre-operative cardiovascular examination 01/03/2020  . Acute otitis externa of left ear 11/13/2019  . Acute suppurative otitis media of left ear without spontaneous rupture of tympanic membrane 11/13/2019  . Conductive hearing loss of left ear 11/13/2019  . Tinnitus of left ear 11/13/2019  . Lumbar stenosis with neurogenic claudication 06/11/2019  . Cervical myelopathy ([place]) 04/22/2019  . Preoperative cardiovascular examination 04/17/2019  . Intermittent claudication ([place]) 10/31/2018  . Coronary artery disease of native artery of native heart with stable angina pectoris ([place])   . Abnormal nuclear stress test 05/03/2018  . Aortic stenosis 05/03/2018  . Acid reflux 02/15/2018  . Arthritis 02/15/2018  . Hyperactive gag reflex 02/15/2018  . Macular degeneration 02/15/2018  . Abnormal LFTs (liver function tests) 10/31/2016  . Coronary artery disease involving native coronary artery of native heart without angina pectoris 05/30/2016  . Diabetes mellitus due to underlying condition with unspecified complications ([place])  47/82/9562  . Dyslipidemia 05/30/2016  . Essential hypertension 05/30/2016  . PAF (paroxysmal atrial fibrillation) ([place]) 05/30/2016    Past Medical History:  Past Medical History:  Diagnosis Date  . Abnormal LFTs (liver function tests) 10/31/2016  . Abnormal nuclear stress test 05/03/2018  . Acid reflux 02/15/2018   not at present  . Acute otitis externa of left ear 11/13/2019  . Acute suppurative otitis media of left ear without spontaneous rupture of tympanic membrane 11/13/2019  . Anemia    low iron  . Aortic stenosis 05/03/2018  . Arthritis 02/15/2018   Overview:  shoulder  . Cancer ([place])    skin cancer on left wrist  . Cervical myelopathy ([place]) 04/22/2019  . Conductive hearing loss of left ear 11/13/2019  . Coronary artery disease involving native coronary artery of native heart without angina pectoris 05/30/2016  . Coronary artery disease of native artery of native heart with stable angina pectoris ([place])    pt. denies  . Diabetes mellitus due to underlying condition with unspecified complications ([place]) 12/21/8655  . Dyslipidemia 05/30/2016  . Dyspnea    upon exertion,  as of 06/05/09 not having any recent chest pain  . Dysrhythmia    a fib  . Essential hypertension 05/30/2016  . Heart murmur    Has had an Aortic valve replacement  . Hyperactive gag reflex 02/15/2018  . Hypothyroidism   . Intermittent claudication ([place]) 10/31/2018  . Lumbar stenosis with neurogenic claudication 06/11/2019  . Macular degeneration 02/15/2018  . PAF (paroxysmal atrial fibrillation) ([place]) 05/30/2016  . Pre-operative cardiovascular examination 01/03/2020  . Preoperative cardiovascular examination 04/17/2019  . Pseudogout of left knee 03/21/2020  . Renal insufficiency 01/17/2020  . S/P aortic valve replacement 01/17/2020  . Tinnitus  of left ear 11/13/2019   Past Surgical History:  Past Surgical History:  Procedure Laterality Date  . ANTERIOR CERVICAL DECOMP/DISCECTOMY FUSION N/A 04/22/2019   Procedure:  Anterior Cervical Discectomy and Fusion, Cervical four-five, Cervical five-six;  Surgeon: Earnie Larsson, MD;  Location: [facility];  Service: Neurosurgery;  Laterality: N/A;  . ANTERIOR CERVICAL DECOMP/DISCECTOMY FUSION N/A 03/02/2020   Procedure: Anterior Cervical Decompression Fusion  - Cervical three-Cervical four;  Surgeon: Earnie Larsson, MD;  Location: [facility];  Service: Neurosurgery;  Laterality: N/A;  . AORTIC VALVE REPLACEMENT    . CARDIAC CATHETERIZATION     several stents  . CARDIAC SURGERY    . CORONARY ARTERY BYPASS GRAFT  2017   patient unsure how many, had at [facility]  . HAND SURGERY    . IR LUMBAR [place] W/IMG GUIDE  04/07/2020  . IRRIGATION AND DEBRIDEMENT KNEE Bilateral 03/24/2020   Procedure: ARTHROSCOPY IRRIGATION AND DEBRIDEMENT BILATERAL  KNEE;  Surgeon: Marchia Bond, MD;  Location: [facility];  Service: Orthopedics;  Laterality: Bilateral;  . KNEE SURGERY    . LEFT HEART CATH AND CORONARY ANGIOGRAPHY N/A 01/21/2020   Procedure: LEFT HEART CATH AND CORONARY ANGIOGRAPHY;  Surgeon: Belva Crome, MD;  Location: [facility];  Service: Cardiovascular;  Laterality: N/A;  . LEFT HEART CATH AND CORS/GRAFTS ANGIOGRAPHY N/A 05/16/2018   Procedure: LEFT HEART CATH AND CORS/GRAFTS ANGIOGRAPHY;  Surgeon: Burnell Blanks, MD;  Location: [facility] [facility];  Service: Cardiovascular;  Laterality: N/A;  . LUMBAR LAMINECTOMY/DECOMPRESSION MICRODISCECTOMY Bilateral 06/11/2019   Procedure: Bilateral Lumbar Four-FiveLaminectomy/Foraminotomy;  Surgeon: Earnie Larsson, MD;  Location: [facility];  Service: Neurosurgery;  Laterality: Bilateral;  posterior  . TEE WITHOUT CARDIOVERSION N/A 03/26/2020   Procedure: TRANSESOPHAGEAL ECHOCARDIOGRAM (TEE);  Surgeon: Jerline Pain, MD;  Location: [facility];  Service: Cardiovascular;  Laterality: N/A;    Assessment & Plan Clinical Impression: Ramses Klecka a 69 year old right-handed male with history of diabetes  mellitus,PAFmaintained on amiodarone as well as Eliquis, hypertension, bioprosthetic aortic valve replacement2017, lumbar laminectomy decompression 06/11/2019 as well as recent C3-4 anterior cervical discectomy with interbody fusion for cervical stenosis myelopathy 03/02/2020 per [name]. Per chart review patient lives with spouse. 1 level home 7 steps to entry. He was using a rolling walker prior to admission. Wife works during the day. Presented 03/19/2020 with low-grade fever and weakness lower extremities. Lactic acid 1.6, sodium 130, BUN 98, creatinine 6.40 with latest prior creatinine 1.63 02/24/2020, WBC 12,600, hemoglobin 10.4, CK 20, urinalysis showed RBCs of 21-50 WBC 21-50 protein around 100 bacteria rarehyalinecast, SARS coronavirus negative. His LFTs were markedly elevated AST 415 ALT 429 total bilirubin 5.5. CT abdomen pelvis as well as CT cervical spine unremarkable. MRI cervical spine negative for infection. Patient was hypotensive and received fluid bolus. Placed on broad spectrum antibiotics for suspected sepsis. MRI lumbar spine findings consistent with acute osteomyelitis discitis at L4-5. Evidence for associated soft tissue infection cellulitis within the psoas musculature bilaterally with a few small superimposed abscesses. Additional multilevel degenerative spondylosis most pronounced at L3-4. Neurosurgery consulted no plan for surgical interventionand underwent aspiration of disc space L4-5 with results pending. TEE showed no obvious vegetation and follow-up per cardiology services. Orthopedic services consulted Dr. Mardelle Matte for bilateral knee pain with noted swelling of left knee. X-ray showed diffuse degenerative changes as well as a large joint effusion. Doppler showed ruptured Baker's cyst on left lower extremity. Patient underwent left lower extremity joint aspiration 03/21/2020 and right lower extremity  joint aspiration 03/22/2020 both showing intracellular calcium  pyrophosphate crystals WBC was up to 15,000 culture grew rare E Faecalis. Patient also did receive steroid injection of both knees. Underwent I&D bilateral knees 03/24/2020. Infectious disease continues to follow currently maintained on intravenous ampicillin x7 to 8 weeks through 05/20/2020. PICC line was placed 03/29/2020. AKI on CKD stage III continues to improve multifactorial from bacteremia with latest creatinine 1.32. Normocytic anemia/postoperative anemia patient was transfused 2 units of packed red blood cells latest hemoglobin 7.6. Patient transferred to CIR on 04/07/2020 .    Patient currently requires mod with basic self-care skills secondary to muscle weakness and muscle joint tightness and decreased cardiorespiratoy endurance.  Prior to hospitalization, patient could complete BADLs with modified independent .  Patient will benefit from skilled intervention to decrease level of assist with basic self-care skills and increase independence with basic self-care skills prior to discharge home with care partner.  Anticipate patient will require 24 hour supervision and follow up home health.   Skilled Therapeutic Intervention Session 1: Patient met seated on BSC in agreement with OT eval/treat. Patient reporting multiple pain sites at R knee, L hip, and lower back. RN aware and meds administered. OT provided repositioning, rest breaks, and emotional support for pain management. Rehab goals, purpose and role discussed with patient/wife in agreement. OT provided education on waiting for nursing staff to assist with toileting as patient reports his wife assisted him to [facility]. OT also educated patients wife on need to be educated on safe transfers prior to assisting patient to and from bathroom. Sit to stand from [facility] and stand-pivot transfer to EOB with Mod A secondary to muscle weakness and pain. OT noted pressure injury on bottom. RN notified. Patient engaged in UB bathing/dressing with Min A and LB  bathing/dressing in sitting/supine with Mod A. Socks and TED hose donned with total A. Attempted sit to stand from EOB to RW with occurrence of L elbow buckling. Return to supine with assistance to bring BLE from EOB to bed level. Session concluded with patient in side-lying with call bell within reach, bed alarm activated, and all needs met.   Session 2: Patient met lying supine in bed in agreement with OT treatment session with focus on self-care re-education, functional transfers, and activity tolerance as detailed below. Supine to EOB with Mod A and increased time/effort. Patient indicated need to void. Toileting/hygiene/clothing management seated EOB with use of urinal and Min A after set-up. Patient required assistance to don LSO brace seated EOB. Squat-pivot transfer to wc on R with cues for hand placement and head/hip relationship. OT provided patient/family education on use of modalities for pain management. Heat applied to low back and session concluded with patient seated in wc with wife present, call bell within reach, belt alarm activated, and all needs met.   OT Evaluation Precautions/Restrictions  Precautions Precautions: Fall;Cervical Precaution Comments: ACDF on 4/12, pt reports they d/c-ed collar 1 week after surgery Required Braces or Orthoses: Spinal Brace Spinal Brace: Lumbar corset Other Brace: for comfort Restrictions Weight Bearing Restrictions: No General Chart Reviewed: Yes Family/Caregiver Present: Yes Vital Signs Therapy Vitals Temp: 98.3 F (36.8 C) Pulse Rate: (!) 141 Resp: 18 BP: 123/82 Patient Position (if appropriate): Lying Oxygen Therapy SpO2: 100 % O2 Device: Room Air Pain Pain Assessment Pain Scale: 0-10 Pain Score: 8  Pain Type: Neuropathic pain Pain Location: Leg Pain Orientation: Right Pain Descriptors / Indicators: Shooting Pain Onset: Sudden Pain Intervention(s): Medication (See eMAR);Distraction;Emotional support;Repositioned Multiple  Pain  Sites: Yes 2nd Pain Site Pain Score: 6(L hip (dull, aching)) 3rd Pain Site Pain Score: 8(Low back (constant)) Home Living/Prior Functioning Home Living Family/patient expects to be discharged to:: Private residence Available Help at Discharge: Family, Available 24 hours/day Type of Home: House Home Access: Stairs to enter [employer] of Steps: 7 Entrance Stairs-Rails: Can reach both Home Layout: One level Bathroom Shower/Tub: Multimedia programmer: Handicapped height Additional Comments: Grab bars in shower with built in seat, no grab bars near toilet  Lives With: Spouse IADL History Current License: Yes Mode of Transportation: Car IADL Comments: Wife was responsible for IADLs Prior Function Level of Independence: Independent with basic ADLs, Independent with gait(Recently ambulating short distances with use of RW)  Able to Take Stairs?: Yes Driving: Yes Vocation: Full time employment(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) ADL ADL Upper Body Bathing: Minimal assistance Where Assessed-Upper Body Bathing: Edge of bed Lower Body Bathing: Moderate assistance Where Assessed-Lower Body Bathing: Edge of bed Upper Body Dressing: Minimal assistance Where Assessed-Upper Body Dressing: Edge of bed Lower Body Dressing: Moderate assistance Where Assessed-Lower Body Dressing: Edge of bed Toileting: Maximal assistance Where Assessed-Toileting: Bedside Commode Toilet Transfer: Moderate assistance Toilet Transfer Method: Stand pivot Toilet Transfer Equipment: Bedside commode Vision Baseline Vision/History: Wears glasses Wears Glasses: Reading only Patient Visual Report: No change from baseline Vision Assessment?: No apparent visual deficits Perception  Perception: Within Functional Limits Praxis Praxis: Intact Cognition Overall Cognitive Status: Within Functional Limits for tasks assessed Orientation Level:  Person;Place;Situation Person: Oriented Place: Oriented Situation: Oriented Year: 2021 Month: May Day of Week: Correct Memory: Appears intact Immediate Memory Recall: Sock;Blue;Bed Memory Recall Sock: Without Cue Memory Recall Blue: Without Cue Memory Recall Bed: With Cue Attention: Focused;Sustained;Selective Focused Attention: Appears intact Sustained Attention: Appears intact Selective Attention: Impaired Selective Attention Impairment: Functional basic Awareness: Appears intact Problem Solving: Appears intact Safety/Judgment: Impaired Comments: Patient requires cueing for safety with RW Sensation Sensation Light Touch: Appears Intact Central sensation comments: WFL Peripheral sensation comments: Patient reports numbness in bilateral hands and feet Coordination Gross Motor Movements are Fluid and Coordinated: No Fine Motor Movements are Fluid and Coordinated: No Motor  Motor Motor: Within Functional Limits Mobility  Bed Mobility Bed Mobility: Rolling Right;Rolling Left;Supine to Sit;Sitting - Scoot to [employer] of Bed;Sit to Supine Rolling Right: Minimal Assistance - Patient > 75% Rolling Left: Minimal Assistance - Patient > 75% Sitting - Scoot to Edge of Bed: Minimal Assistance - Patient > 75% Sit to Supine: Minimal Assistance - Patient > 75% Transfers Sit to Stand: Moderate Assistance - Patient 50-74% Stand to Sit: Moderate Assistance - Patient 50-74%  Trunk/Postural Assessment  Cervical Assessment Cervical Assessment: Exceptions to WFL(forward head) Thoracic Assessment Thoracic Assessment: Exceptions to [facility] Lumbar Assessment Lumbar Assessment: Exceptions to [facility] Postural Control Postural Control: Deficits on evaluation  Balance Balance Balance Assessed: Yes Static Sitting Balance Static Sitting - Balance Support: Feet supported Static Sitting - Level of Assistance: 5: Stand by assistance Dynamic Sitting Balance Dynamic Sitting - Balance Support: Feet  supported Dynamic Sitting - Level of Assistance: 4: Min assist;Other (comment)(Cueing for posture) Static Standing Balance Static Standing - Balance Support: During functional activity Static Standing - Level of Assistance: 5: Stand by assistance Static Standing - Comment/# of Minutes: 30 sec to 1 minute Dynamic Standing Balance Dynamic Standing - Balance Support: Bilateral upper extremity supported Dynamic Standing - Level of Assistance: 4: Min assist Extremity/Trunk Assessment RUE Assessment RUE Assessment: Within Functional  Limits Passive Range of Motion (PROM) Comments: WFL Active Range of Motion (AROM) Comments: WFL General Strength Comments: 3-/5 LUE Assessment LUE Assessment: Within Functional Limits Passive Range of Motion (PROM) Comments: WFL Active Range of Motion (AROM) Comments: WFL General Strength Comments: 3-/5     Refer to Care Plan for Long Term Goals  Recommendations for other services: Neuropsych   Discharge Criteria: Patient will be discharged from OT if patient refuses treatment 3 consecutive times without medical reason, if treatment goals not met, if there is a change in medical status, if patient makes no progress towards goals or if patient is discharged from hospital.  The above assessment, treatment plan, treatment alternatives and goals were discussed and mutually agreed upon: by patient  Jesscia Imm R [place] 04/08/2020, 11:30 AM

## 2020-04-08 NOTE — Evaluation (Signed)
Physical Therapy Assessment and Plan  Patient Details  Name: Johnathan Murray MRN: [id number] Date of Birth: 28-Apr-1951  PT Diagnosis: Difficulty walking, Impaired sensation, Low back pain and Muscle weakness Rehab Potential: Good ELOS: 2-2.5 weeks   Today's Date: 04/08/2020 PT Individual Time: 1000-1100 and 1515-1530 PT Individual Time Calculation (min): 60 min and 15 min  and Today's Date: 04/08/2020 PT Missed Time: 15 Minutes Missed Time Reason: Unavailable (Comment)(Cardiac PA in room to see pt)   Problem List:  Patient Active Problem List   Diagnosis Date Noted  . Discitis 04/07/2020  . Protein-calorie malnutrition, severe 03/24/2020  . Septic arthritis ([place]) 03/23/2020  . History of fusion of cervical spine 03/23/2020  . Enterococcal bacteremia 03/21/2020  . Pseudogout of left knee 03/21/2020  . ARF (acute renal failure) ([place]) 03/20/2020  . S/P aortic valve replacement 01/17/2020  . Renal insufficiency 01/17/2020  . Pre-operative cardiovascular examination 01/03/2020  . Acute otitis externa of left ear 11/13/2019  . Acute suppurative otitis media of left ear without spontaneous rupture of tympanic membrane 11/13/2019  . Conductive hearing loss of left ear 11/13/2019  . Tinnitus of left ear 11/13/2019  . Lumbar stenosis with neurogenic claudication 06/11/2019  . Cervical myelopathy ([place]) 04/22/2019  . Preoperative cardiovascular examination 04/17/2019  . Intermittent claudication ([place]) 10/31/2018  . Coronary artery disease of native artery of native heart with stable angina pectoris ([place])   . Abnormal nuclear stress test 05/03/2018  . Aortic stenosis 05/03/2018  . Acid reflux 02/15/2018  . Arthritis 02/15/2018  . Hyperactive gag reflex 02/15/2018  . Macular degeneration 02/15/2018  . Abnormal LFTs (liver function tests) 10/31/2016  . Coronary artery disease involving native coronary artery of native heart without angina pectoris 05/30/2016  . Diabetes mellitus due  to underlying condition with unspecified complications ([place]) 40/98/1191  . Dyslipidemia 05/30/2016  . Essential hypertension 05/30/2016  . PAF (paroxysmal atrial fibrillation) ([place]) 05/30/2016    Past Medical History:  Past Medical History:  Diagnosis Date  . Abnormal LFTs (liver function tests) 10/31/2016  . Abnormal nuclear stress test 05/03/2018  . Acid reflux 02/15/2018   not at present  . Acute otitis externa of left ear 11/13/2019  . Acute suppurative otitis media of left ear without spontaneous rupture of tympanic membrane 11/13/2019  . Anemia    low iron  . Aortic stenosis 05/03/2018  . Arthritis 02/15/2018   Overview:  shoulder  . Cancer ([place])    skin cancer on left wrist  . Cervical myelopathy ([place]) 04/22/2019  . Conductive hearing loss of left ear 11/13/2019  . Coronary artery disease involving native coronary artery of native heart without angina pectoris 05/30/2016  . Coronary artery disease of native artery of native heart with stable angina pectoris ([place])    pt. denies  . Diabetes mellitus due to underlying condition with unspecified complications ([place]) 4/78/2956  . Dyslipidemia 05/30/2016  . Dyspnea    upon exertion,  as of 06/05/09 not having any recent chest pain  . Dysrhythmia    a fib  . Essential hypertension 05/30/2016  . Heart murmur    Has had an Aortic valve replacement  . Hyperactive gag reflex 02/15/2018  . Hypothyroidism   . Intermittent claudication ([place]) 10/31/2018  . Lumbar stenosis with neurogenic claudication 06/11/2019  . Macular degeneration 02/15/2018  . PAF (paroxysmal atrial fibrillation) ([place]) 05/30/2016  . Pre-operative cardiovascular examination 01/03/2020  . Preoperative cardiovascular examination 04/17/2019  . Pseudogout of left knee 03/21/2020  . Renal insufficiency 01/17/2020  .  S/P aortic valve replacement 01/17/2020  . Tinnitus of left ear 11/13/2019   Past Surgical History:  Past Surgical History:  Procedure Laterality Date  . ANTERIOR  CERVICAL DECOMP/DISCECTOMY FUSION N/A 04/22/2019   Procedure: Anterior Cervical Discectomy and Fusion, Cervical four-five, Cervical five-six;  Surgeon: Earnie Larsson, MD;  Location: [facility];  Service: Neurosurgery;  Laterality: N/A;  . ANTERIOR CERVICAL DECOMP/DISCECTOMY FUSION N/A 03/02/2020   Procedure: Anterior Cervical Decompression Fusion  - Cervical three-Cervical four;  Surgeon: Earnie Larsson, MD;  Location: [facility];  Service: Neurosurgery;  Laterality: N/A;  . AORTIC VALVE REPLACEMENT    . CARDIAC CATHETERIZATION     several stents  . CARDIAC SURGERY    . CORONARY ARTERY BYPASS GRAFT  2017   patient unsure how many, had at [facility]  . HAND SURGERY    . IR LUMBAR [place] W/IMG GUIDE  04/07/2020  . IRRIGATION AND DEBRIDEMENT KNEE Bilateral 03/24/2020   Procedure: ARTHROSCOPY IRRIGATION AND DEBRIDEMENT BILATERAL  KNEE;  Surgeon: Marchia Bond, MD;  Location: [facility];  Service: Orthopedics;  Laterality: Bilateral;  . KNEE SURGERY    . LEFT HEART CATH AND CORONARY ANGIOGRAPHY N/A 01/21/2020   Procedure: LEFT HEART CATH AND CORONARY ANGIOGRAPHY;  Surgeon: Belva Crome, MD;  Location: [place] [facility];  Service: Cardiovascular;  Laterality: N/A;  . LEFT HEART CATH AND CORS/GRAFTS ANGIOGRAPHY N/A 05/16/2018   Procedure: LEFT HEART CATH AND CORS/GRAFTS ANGIOGRAPHY;  Surgeon: Burnell Blanks, MD;  Location: [place] [facility];  Service: Cardiovascular;  Laterality: N/A;  . LUMBAR LAMINECTOMY/DECOMPRESSION MICRODISCECTOMY Bilateral 06/11/2019   Procedure: Bilateral Lumbar Four-FiveLaminectomy/Foraminotomy;  Surgeon: Earnie Larsson, MD;  Location: [place];  Service: Neurosurgery;  Laterality: Bilateral;  posterior  . TEE WITHOUT CARDIOVERSION N/A 03/26/2020   Procedure: TRANSESOPHAGEAL ECHOCARDIOGRAM (TEE);  Surgeon: Jerline Pain, MD;  Location: [facility] ENDOSCOPY;  Service: Cardiovascular;  Laterality: N/A;    Assessment & Plan Clinical Impression: Patient is a 69 year old right-handed  male with history of diabetes mellitus,PAFmaintained on amiodarone as well as Eliquis, hypertension, bioprosthetic aortic valve replacement2017, lumbar laminectomy decompression 06/11/2019 as well as recent C3-4 anterior cervical discectomy with interbody fusion for cervical stenosis myelopathy 03/02/2020 per [name]. Per chart review patient lives with spouse. 1 level home 7 steps to entry. He was using a rolling walker prior to admission. Wife works during the day. Presented 03/19/2020 with low-grade fever and weakness lower extremities. Lactic acid 1.6, sodium 130, BUN 98, creatinine 6.40 with latest prior creatinine 1.63 02/24/2020, WBC 12,600, hemoglobin 10.4, CK 20, urinalysis showed RBCs of 21-50 WBC 21-50 protein around 100 bacteria rarehyalinecast, SARS coronavirus negative. His LFTs were markedly elevated AST 415 ALT 429 total bilirubin 5.5. CT abdomen pelvis as well as CT cervical spine unremarkable. MRI cervical spine negative for infection. Patient was hypotensive and received fluid bolus. Placed on broad spectrum antibiotics for suspected sepsis. MRI lumbar spine findings consistent with acute osteomyelitis discitis at L4-5. Evidence for associated soft tissue infection cellulitis within the psoas musculature bilaterally with a few small superimposed abscesses. Additional multilevel degenerative spondylosis most pronounced at L3-4. Neurosurgery consulted no plan for surgical interventionand underwent aspiration of disc space L4-5 with results pending. TEE showed no obvious vegetation and follow-up per cardiology services. Orthopedic services consulted Dr. Mardelle Matte for bilateral knee pain with noted swelling of left knee. X-ray showed diffuse degenerative changes as well as a large joint effusion. Doppler showed ruptured Baker's cyst on left lower extremity. Patient underwent left lower  extremity joint aspiration 03/21/2020 and right lower extremity joint aspiration 03/22/2020 both  showing intracellular calcium pyrophosphate crystals WBC was up to 15,000 culture grew rare E Faecalis. Patient also did receive steroid injection of both knees. Underwent I&D bilateral knees 03/24/2020. Infectious disease continues to follow currently maintained on intravenous ampicillin x7 to 8 weeks through 05/20/2020. PICC line was placed 03/29/2020. AKI on CKD stage III continues to improve multifactorial from bacteremia with latest creatinine 1.32. Normocytic anemia/postoperative anemia patient was transfused 2 units of packed red blood cells latest hemoglobin 7.6.  Patient transferred to CIR on 04/07/2020 .   Patient currently requires mod with mobility secondary to muscle weakness, decreased cardiorespiratoy endurance,   and decreased sitting balance, decreased standing balance, decreased postural control and decreased balance strategies.  Prior to hospitalization, patient was modified independent  with mobility and lived with Spouse in a House home.  Home access is 7  Stairs to enter with BHR.  Patient will benefit from skilled PT intervention to maximize safe functional mobility, minimize fall risk and decrease caregiver burden for planned discharge home with intermittent assist.  Anticipate patient will benefit from follow up [facility] at discharge.  PT - End of Session Activity Tolerance: Tolerates 30+ min activity with multiple rests Endurance Deficit: Yes Endurance Deficit Description: Secondary to deconditioning PT Assessment Rehab Potential (ACUTE/IP ONLY): Good PT Barriers to Discharge: Home environment access/layout PT Patient demonstrates impairments in the following area(s): Balance;Endurance;Motor;Pain;Safety PT Transfers Functional Problem(s): Bed Mobility;Bed to Chair;Car PT Locomotion Functional Problem(s): Ambulation;Wheelchair Mobility;Stairs PT Plan PT Intensity: Minimum of 1-2 x/day ,45 to 90 minutes PT Frequency: 5 out of 7 days PT Duration Estimated Length of Stay: 2-2.5  weeks PT Treatment/Interventions: Ambulation/gait training;Cognitive remediation/compensation;Discharge planning;DME/adaptive equipment instruction;Functional mobility training;Pain management;Psychosocial support;Splinting/orthotics;Therapeutic Activities;UE/LE Strength taining/ROM;Visual/perceptual remediation/compensation;Balance/vestibular training;Disease management/prevention;Community reintegration;Functional electrical stimulation;Neuromuscular re-education;Patient/family education;Stair training;Therapeutic Exercise;UE/LE Coordination activities;Wheelchair propulsion/positioning PT Transfers Anticipated Outcome(s): Supervision PT Locomotion Anticipated Outcome(s): Supervision PT Recommendation Follow Up Recommendations: Home health PT Patient destination: Home Equipment Recommended: To be determined  Skilled Therapeutic Intervention  Evaluation completed (see details above and below) with education on PT POC and goals and individual treatment initiated with focus on bed mobility, functional transfers, car transfer, gait training initiation. Pt received in supine and agreeable to therapy. Pain complaint as detailed below. PT educates pt on logrolling technique for bed mobility and pt performs supine>sit with modA and tactile facilitation at trunk and BLEs. PT educates on LSO on provides totalA to don at EOB. Squat pivot from bed to [place] with modA and increased time required due to pain provocation when pt mobilizes. WC transport to gym for energy conservation and time management. Pt performs squat pivot car transfer with maxA and PT cuing on sequencing, hand placement, and head/hips relationship to improve safety of transfer. Pt performs sit to stand with modA and ambulation x5' with RW and modA. Pt put significant amount of weight through RW and requires assistance pushing RW. Verbalizes pain, fatigue, and shortness of breath following ambulation. WC>bed squat pivot transfer with modA and return to  supine with modA management of BLEs.  2nd Session: Pt misses 15 minutes due to Cardiac PA in room addressing pt's afib. Pt agreeable to therapy, but reports ongoing pain in low back. RN aware and rest breaks provided as needed to manage pain. Pt performs x1 sit to stand in // bars with modA. Maintains standing 30 seconds and attempts to take steps but abruptly sits back down with spasming back pain.   Pt self  propels WC x150' with supervision for cues on improved propulsion efficiency. Pt left seated in WC with alarm intact and all needs within reach.  PT Evaluation Precautions/Restrictions Precautions Precautions: Fall;Cervical;Other (comment)(Spinal) Precaution Comments: ACDF on 4/12, pt reports they d/c-ed collar 1 week after surgery Required Braces or Orthoses: Spinal Brace Spinal Brace: Lumbar corset Other Brace: for comfort Restrictions Weight Bearing Restrictions: No  Pain Pain Assessment Pain Scale: 0-10 Pain Score: 8  Pain Type: Acute pain;Neuropathic pain Pain Location: Back Pain Orientation: Mid;Lower Pain Descriptors / Indicators: Sharp Pain Frequency: Constant Pain Onset: With Activity Pain Intervention(s): Repositioned;Ambulation/increased activity;RN made aware;Relaxation Multiple Pain Sites: Yes 2nd Pain Site Pain Score: 6(L hip (dull, aching)) 3rd Pain Site Pain Score: 8(Low back (constant)) Home Living/Prior Functioning Home Living Available Help at Discharge: Family;Available 24 hours/day Type of Home: House Home Access: Stairs to enter [employer] of Steps: 7 Entrance Stairs-Rails: Can reach both Home Layout: One level Bathroom Shower/Tub: Multimedia programmer: Handicapped height Additional Comments: Grab bars in shower with built in seat, no grab bars near toilet  Lives With: Spouse Prior Function Level of Independence: Independent with basic ADLs;Independent with gait  Able to Take Stairs?: Yes Driving: Yes Vocation: Full  time employment 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 Vision/Perception  Perception Perception: Within Functional Limits Praxis Praxis: Intact  Cognition Overall Cognitive Status: Within Functional Limits for tasks assessed Orientation Level: Oriented X4 Attention: Focused;Sustained;Selective Focused Attention: Appears intact Sustained Attention: Appears intact Selective Attention: Impaired Selective Attention Impairment: Functional basic Memory: Appears intact Immediate Memory Recall: Sock;Blue;Bed Memory Recall Sock: Without Cue Memory Recall Blue: Without Cue Memory Recall Bed: With Cue Awareness: Appears intact Problem Solving: Appears intact Safety/Judgment: Appears intact Comments: Patient requires cueing for safety with RW Sensation Sensation Light Touch: Impaired by gross assessment Central sensation comments: WFL Peripheral sensation comments: Patient reports numbness in bilateral hands and feet Coordination Gross Motor Movements are Fluid and Coordinated: No Fine Motor Movements are Fluid and Coordinated: No Coordination and Movement Description: All movements limited by pain and fear of pain provocation Motor  Motor Motor: Within Functional Limits  Mobility Bed Mobility Bed Mobility: Supine to Sit;Sitting - Scoot to [employer] of Bed;Sit to Supine Rolling Right: Minimal Assistance - Patient > 75% Rolling Left: Minimal Assistance - Patient > 75% Supine to Sit: Moderate Assistance - Patient 50-74% Sitting - Scoot to Edge of Bed: Minimal Assistance - Patient > 75% Sit to Supine: Moderate Assistance - Patient 50-74% Transfers Transfers: Sit to Stand;Stand to Sit;Stand Pivot Transfers;Squat Pivot Transfers Sit to Stand: Moderate Assistance - Patient 50-74% Stand to Sit: Moderate Assistance - Patient 50-74% Stand Pivot Transfers: Moderate Assistance - Patient 50 - 74% Stand Pivot Transfer Details: Tactile cues for posture;Verbal cues for technique;Verbal cues  for precautions/safety;Tactile cues for initiation Squat Pivot Transfers: Moderate Assistance - Patient 50-74% Transfer (Assistive device): Rolling walker Locomotion  Gait Ambulation: Yes Gait Assistance: Moderate Assistance - Patient 50-74% Gait Distance (Feet): 5 Feet Assistive device: Rolling walker Gait Assistance Details: Verbal cues for technique;Tactile cues for posture;Verbal cues for sequencing;Verbal cues for safe use of DME/AE Gait Gait: Yes Gait Pattern: Antalgic;Shuffle Gait velocity: significantly decreased Stairs / Additional Locomotion Stairs: No Wheelchair Mobility Wheelchair Mobility: No  Trunk/Postural Assessment  Cervical Assessment Cervical Assessment: Exceptions to WFL(forward head) Thoracic Assessment Thoracic Assessment: Exceptions to WFL(rounded thoracic spine) Lumbar Assessment Lumbar Assessment: Exceptions to WFL(posterior pelvic tilt in sitting) Postural Control Postural Control: Deficits on evaluation Postural Limitations: Delayed righting reactions.  Likely secondary to pain.  Balance Balance Balance Assessed: Yes Static Sitting Balance Static Sitting - Balance Support: Feet supported Static Sitting - Level of Assistance: 5: Stand by assistance Dynamic Sitting Balance Dynamic Sitting - Balance Support: Feet supported Dynamic Sitting - Level of Assistance: 4: Min assist Static Standing Balance Static Standing - Balance Support: During functional activity Static Standing - Level of Assistance: 4: Min assist Static Standing - Comment/# of Minutes: 30 sec to 1 minute Dynamic Standing Balance Dynamic Standing - Balance Support: Bilateral upper extremity supported Dynamic Standing - Level of Assistance: 3: Mod assist Extremity Assessment  RLE Assessment RLE Assessment: Exceptions to [facility] Passive Range of Motion (PROM) Comments: WFL Active Range of Motion (AROM) Comments: Limited by pain and weakness General Strength Comments: RLE grossly 4/5 LLE  Assessment LLE Assessment: Exceptions to [facility] Passive Range of Motion (PROM) Comments: WFL Active Range of Motion (AROM) Comments: Limited by pain and weakness General Strength Comments: Hip flexion 3-/5, Knee Extension 3/5, Dorsiflexion/Plantarflexion 4/5    Refer to Care Plan for Long Term Goals  Recommendations for other services: None   Discharge Criteria: Patient will be discharged from PT if patient refuses treatment 3 consecutive times without medical reason, if treatment goals not met, if there is a change in medical status, if patient makes no progress towards goals or if patient is discharged from hospital.  The above assessment, treatment plan, treatment alternatives and goals were discussed and mutually agreed upon: by patient  Breck Coons, PT, DPT 04/08/2020, 4:08 PM

## 2020-04-08 NOTE — Progress Notes (Signed)
Reviewed results from knee aspiration today, no organisms, white blood cell count approximately 26,000, which typically would be reassuring although his last aspiration only had 19,000 and ended up being infected.  Additionally, he had a reported both intra-articular and extra-articular calcium pyrophosphate crystals in this aspiration as well.  In discussion with him, he reports minimal pain in his knees, he says in fact the right one bothers him a little bit more than the left one, the main reason for the repeat aspiration was because he had swelling that has been persistent since even before the last surgery.  I asked him whether or not he felt like he needed another surgery in order to washout his knees again, and he indicated that he would very much like to avoid any more surgery if at all possible, and his knees are not feeling bad enough to warrant more aggressive intervention.  All things put together, I think we will continue the conservative course for the time being, continue to monitor the culture results, if his knee aspiration taken today demonstrates positive growth, then we may consider a repeat washout, otherwise continue with a conservative course.  Johnathan Bridge, MD

## 2020-04-08 NOTE — Progress Notes (Signed)
[facility] for Infectious Disease  Date of Admission:  04/07/2020      Total days of antibiotics 15  Ampicillin   Ceftriaxone           ASSESSMENT: Johnathan Murray is a 69 y.o. male with history of enterococcal bacteremia compounded by bilateral knee septic arthritis (growth + enterococcus faecalis, WBC > 15,000 now s/p I&D bilaterally on 5/04. He has since developed an acute discitis at L4-L5 confirming this on MRI 5/13; some surrounding paraspinal small abscesses but nothing drainable. Culture from aspirate is without any organism on gram stain and nothing on culture early in - likely this is also enterococcus but will follow culture to maturity to ensure no secondary pathogen.  Concerning that he is still having periodic fevers - TEE on 5/06 revealed no concern over bioprosthetic valve involvement; however given concern for heavy burden of infection will add ceftriaxone bid for synergy on presumptive involvement.  His left knee has significant effusion today which is concerning that he has ongoing nidus of infection here causing fevers - discussed with CIR team and would recommend orthopedic team to re-evaluate to aspirate joint and send fluid for cell count and culture.    PLAN: 1. Continue ampicillin IV 2. Add ceftriaxone bid  3. Ortho to call back and see for L knee aspiration 4. Follow micro data from back aspirate.    Principal Problem:   Discitis   . amLODipine  10 mg Oral Daily  . apixaban  5 mg Oral BID  . vitamin C  500 mg Oral Daily  . bupivacaine  10 mL Infiltration Once  . Chlorhexidine Gluconate Cloth  6 each Topical BID  . docusate sodium  100 mg Oral BID  . insulin aspart  0-15 Units Subcutaneous TID WC  . insulin aspart  3 Units Subcutaneous TID WC  . insulin glargine  10 Units Subcutaneous BID  . iron polysaccharides  150 mg Oral Q breakfast  . levothyroxine  25 mcg Oral Q0600  . methylPREDNISolone acetate  80 mg Intra-articular Once    . pregabalin  50 mg Oral BID  . senna  1 tablet Oral BID  . vitamin B-12  1,000 mcg Oral Daily    SUBJECTIVE: Rapid response team is in the room now for assessment. His wife is also in the room.  He states he was having dizziness and feeling off - "like he was in Afib again." Still having some fevers - last PM 100.8 F.  Back pain unchanged; gram stain without organisms and culture without early growth thusfar.  Left knee in particular is stiff and feels as if he needs to keep it bent at an angle for comfort.  He is frustrated as he would like to get on with his rehabilitation.    Review of Systems: Review of Systems  Constitutional: Positive for malaise/fatigue. Negative for chills, fever and weight loss.  HENT: Negative for sore throat.   Respiratory: Positive for shortness of breath. Negative for cough and sputum production.   Cardiovascular: Negative for chest pain and leg swelling.  Gastrointestinal: Negative for abdominal pain, diarrhea and vomiting.  Genitourinary: Negative for dysuria and flank pain.  Musculoskeletal: Positive for back pain and joint pain. Negative for myalgias and neck pain.  Skin: Negative for rash.  Neurological: Positive for dizziness. Negative for tingling and headaches.  Psychiatric/Behavioral: Negative for depression and substance abuse. The patient is not nervous/anxious and does not have insomnia.  Allergies  Allergen Reactions  . Atorvastatin Other (See Comments)    Urinary retention   . Nebivolol Diarrhea    OBJECTIVE: Vitals:   04/08/20 0929 04/08/20 1014 04/08/20 1104 04/08/20 1145  BP:  98/76 123/82 108/77  Pulse:  (!) 135 (!) 141 (!) 136  Resp:  17 18   Temp:   98.3 F (36.8 C) 100.1 F (37.8 C)  TempSrc:    Oral  SpO2:  100% 100% 100%  Weight: 82.5 kg     Height:       Body mass index is 26.1 kg/m.  Physical Exam Vitals and nursing note reviewed.  Constitutional:      Appearance: He is well-developed.     Comments:  Resting in bed. No distress. Frustrated   HENT:     Mouth/Throat:     Dentition: Normal dentition. No dental abscesses.  Cardiovascular:     Rate and Rhythm: Normal rate and regular rhythm.     Heart sounds: Normal heart sounds.  Pulmonary:     Effort: Pulmonary effort is normal.     Breath sounds: Normal breath sounds.  Abdominal:     General: There is no distension.     Palpations: Abdomen is soft.     Tenderness: There is no abdominal tenderness.  Musculoskeletal:     Right knee: Swelling present.     Left knee: Swelling and effusion present. Decreased range of motion.     Comments: Left knee with suprapatellar and infrapatellar swelling/effusion. Slightly warm. Stiff joint that is propped up at an angle - cannot tolerate 180deg .  Lymphadenopathy:     Cervical: No cervical adenopathy.  Skin:    General: Skin is warm and dry.     Capillary Refill: Capillary refill takes less than 2 seconds.     Findings: No rash.  Neurological:     Mental Status: He is alert and oriented to person, place, and time.  Psychiatric:        Mood and Affect: Mood normal.        Judgment: Judgment normal.     Lab Results Lab Results  Component Value Date   WBC 10.5 04/08/2020   HGB 7.7 (L) 04/08/2020   HCT 24.6 (L) 04/08/2020   MCV 93.2 04/08/2020   PLT 330 04/08/2020    Lab Results  Component Value Date   CREATININE 1.32 (H) 04/07/2020   BUN 21 04/07/2020   NA 136 04/07/2020   K 4.2 04/07/2020   CL 101 04/07/2020   CO2 26 04/07/2020    Lab Results  Component Value Date   ALT 118 (H) 04/01/2020   AST 99 (H) 04/01/2020   ALKPHOS 79 04/01/2020   BILITOT 1.4 (H) 04/01/2020     Microbiology: Recent Results (from the past 240 hour(s))  Aerobic/Anaerobic Culture (surgical/deep wound)     Status: None (Preliminary result)   Collection Time: 04/07/20  9:20 AM   Specimen: Abscess  Result Value Ref Range Status   Specimen Description ABSCESS L4/L5 DISC SPACE  Final   Special  Requests Normal  Final   Gram Stain   Final    MODERATE WBC PRESENT, PREDOMINANTLY PMN NO ORGANISMS SEEN    Culture   Final    NO GROWTH < 24 HOURS Performed at [facility], [address], [place], [place] [address]    Report Status PENDING  Incomplete  Culture, fungus without smear     Status: None (Preliminary result)   Collection Time:  04/07/20  9:20 AM   Specimen: Abscess  Result Value Ref Range Status   Specimen Description ABSCESS L4/L5 DISC SPACE  Final   Special Requests Normal  Final   Culture   Final    NO GROWTH < 24 HOURS Performed at [facility], [place] [address], [place], [place] [address]    Report Status PENDING  Incomplete     Janene Madeira, MSN, NP-C [facility] for Infectious Disease [email or web address] Pager: [phone] Office: [phone] [place]: [phone]

## 2020-04-08 NOTE — Progress Notes (Signed)
Inpatient Rehabilitation  Patient information reviewed and entered into eRehab system by Luvia Orzechowski M. Angla Delahunt, M.A., CCC/SLP, PPS Coordinator.  Information including medical coding, functional ability and quality indicators will be reviewed and updated through discharge.    

## 2020-04-08 NOTE — Progress Notes (Signed)
[facility] PHYSICAL MEDICINE & REHABILITATION PROGRESS NOTE   Subjective/Complaints:  Pt reports having Afib with RVR_ can feel it- started in early morning.  Also c/o feeling like toothache from R knee down to foot- describing nerve pain- willing to try meds for it.   Has had 3 BMs since admitted, however denies constipation currently- wasn't eating much until yesterday.      ROS:  Pt denies SOB, abd pain, CP, N/V/C/D, and vision changes  Objective:   IR LUMBAR DISC ASPIRATION W/IMG GUIDE  Result Date: 04/07/2020 INDICATION: 69 year old male with history of paroxysmal atrial fibrillation, hypertension, bioprosthetic aortic valve replacement who had undergone recent cervical discectomy and fusion about 3 weeks ago. Admitted now with enterococcal bacteremia, sepsis now s/p 2 weeks of IV antibiotics. Patient was progressing well until the end of last week when he developed new back pain. MRI of the lumbar spine is concerning for L4-5 discitis. He comes or service for a fluoroscopy guided fine needle aspiration. EXAM: Fluoroscopy guided L4-5 disc fine needle aspiration MEDICATIONS: The patient is currently admitted to the hospital and receiving intravenous antibiotics. The antibiotics were administered within an appropriate time frame prior to the initiation of the procedure. ANESTHESIA/SEDATION: Fentanyl 2 mcg IV; Versed 50 mg IV Moderate Sedation Time:  13 The patient was continuously monitored during the procedure by the interventional radiology nurse under my direct supervision. COMPLICATIONS: None immediate. PROCEDURE: Informed written consent was obtained from the patient after a thorough discussion of the procedural risks, benefits and alternatives. All questions were addressed. Maximal Sterile Barrier Technique was utilized including caps, mask, sterile gowns, sterile gloves, sterile drape, hand hygiene and skin antiseptic. A timeout was performed prior to the initiation of the procedure.  The patient was made to lie prone on the angiography table. The lower lumbar spine was prepped and draped in a sterile fashion. Under fluoroscopy, the L4-5 disc space was delineated and the skin area was marked. The skin was infiltrated with a 1% Lidocaine approximately 5 cm lateral to the spinous process projection on the right. Then, an 18 gauge by 15 cm spinal needle was advanced under fluoroscopy into the L4-5 disc space. A 22 gauge x 20 cm spinal needle was then advanced through the 18 gauge needle into the L4-5 disc. The disc was aspirated and serosanguineous fluid was obtained and sent into 2 capped syringes to the laboratory for microbiology analysis. The needle was subsequently withdrawn. The access site was cleaned and covered with a sterile bandage. IMPRESSION: Successful and uncomplicated fluoroscopy guided fine needle aspiration of the L4-5 disc. Sample sent for culture and staining. Electronically Signed   By: Pedro Earls M.D.   On: 04/07/2020 14:24   Recent Labs    04/06/20 0438 04/07/20 0432  WBC 9.7 7.7  HGB 7.4* 7.6*  HCT 23.3* 23.9*  PLT 308 340   Recent Labs    04/06/20 0438 04/07/20 0432  NA 136 136  K 4.1 4.2  CL 101 101  CO2 28 26  GLUCOSE 156* 175*  BUN 18 21  CREATININE 1.31* 1.32*  CALCIUM 8.8* 9.0    Intake/Output Summary (Last 24 hours) at 04/08/2020 0846 Last data filed at 04/08/2020 0412 Gross per 24 hour  Intake 360 ml  Output 1100 ml  Net -740 ml     Physical Exam: Vital Signs Blood pressure 91/71, pulse (!) 144, temperature 97.9 F (36.6 C), resp. rate 17, SpO2 99 %.     Assessment/Plan: 1. Functional deficits  secondary to Discitis/Osteomyelitis  which require 3+ hours per day of interdisciplinary therapy in a comprehensive inpatient rehab setting.  Physiatrist is providing close team supervision and 24 hour management of active medical problems listed below.  Physiatrist and rehab team continue to assess barriers to  discharge/monitor patient progress toward functional and medical goals  Care Tool:  Bathing              Bathing assist       Upper Body Dressing/Undressing Upper body dressing        Upper body assist      Lower Body Dressing/Undressing Lower body dressing            Lower body assist       Toileting Toileting    Toileting assist Assist for toileting: Moderate Assistance - Patient 50 - 74%     Transfers Chair/bed transfer  Transfers assist           Locomotion Ambulation   Ambulation assist              Walk 10 feet activity   Assist           Walk 50 feet activity   Assist           Walk 150 feet activity   Assist           Walk 10 feet on uneven surface  activity   Assist           Wheelchair     Assist               Wheelchair 50 feet with 2 turns activity    Assist            Wheelchair 150 feet activity     Assist          Blood pressure 91/71, pulse (!) 144, temperature 97.9 F (36.6 C), resp. rate 17, SpO2 99 %.  Medical Problem List and Plan: 1.Decreased functional mobilitysecondary to sepsis/Enterococcusfaecalisbacteremia with septic knees and lumbar discitis/osteomyelitis -patient may shower -ELOS/Goals: 12-14 days, supervision to mod I 2. Antithrombotics: -DVT/anticoagulation:Eliquis 5mg  BID -antiplatelet therapy: N/A 3. Pain Management:Flexeril as needed, oxycodone as needed -heat, kpad as appropriate, lumbar corset, ice for knees. -ROM, core strengthening with therapies 4. Mood:Trazodone as needed -antipsychotic agents: N/C 5. Neuropsych: This patientiscapable of making decisions on hisown behalf. 6. Skin/Wound Care:Routine skin checks 7. Fluids/Electrolytes/Nutrition:Routine in and outs with follow-up chemistries 8. Acute osteomyelitis/discitis L4-5. Neurosurgery  follow-up no surgical intervention.Lumbar corsetfor comfort.  -S/p FNA biopsy L4-5 disc today.Await aspiration results  9.Recent C3-4 ACDF 03/02/2020 per [name] for cervical myelopathy. Cervical collar has been discontinued. 10. Septic knee. Status post aspiration and steroid injection with I&D of bilateral knees 03/24/2020. Follow-up orthopedic services 11. ID/Enterococcus bacteremia. Continue intravenous ampicillin through 05/20/2020. Follow-up infectious disease 12. PAF/bioprosthetic aortic valve replacement 2017. Await plan to resume Eliquis. Amiodarone currently on hold  5/19- Back in Afib with RVR- called Cards to see if can restart Amiodarone.  13. AKI. Follow-up chemistries 14. Diabetes mellitus. Hemoglobin A1c 6.8. Currently on NovoLog 3 units 3 times daily as well as Lantus insulin 10 units twice daily. Patient on Glucophage prior to admission 500 mg nightly. Currently on hold due to AKI. Titrate regime to effect CBG (last 3)  Recent Labs    04/07/20 2030 04/07/20 2107 04/08/20 0629  GLUCAP 134* 170* 196*    5/19- BGs 130s-190s- con't regimen 15. Acute on chronic anemia. Continue Niferex. Follow-up CBC  16. Hypothyroidism. Synthroid 17. Nerve pain in RLE- will add Lyrica 50 mg BID and monitor    LOS: 1 days A FACE TO FACE EVALUATION WAS PERFORMED  Elianie Hubers 04/08/2020, 8:46 AM

## 2020-04-08 NOTE — Plan of Care (Signed)
  Problem: RH SAFETY Goal: RH STG ADHERE TO SAFETY PRECAUTIONS W/ASSISTANCE/DEVICE Description: STG Adhere to Safety Precautions With Assistance/Device. Outcome: Progressing Goal: RH STG DECREASED RISK OF FALL WITH ASSISTANCE Description: STG Decreased Risk of Fall With Assistance. Outcome: Progressing   Problem: RH PAIN MANAGEMENT Goal: RH STG PAIN MANAGED AT OR BELOW PT'S PAIN GOAL Outcome: Progressing

## 2020-04-08 NOTE — Progress Notes (Signed)
Called rapid response nurse due to patients on going a-fib.l Patient has chronic a-fib and has been off of amiodarone the last few days. Patient asymptomatic and waiting on cardiology

## 2020-04-08 NOTE — Consult Note (Addendum)
Cardiology Consultation:   Patient ID: Johnathan Murray MRN: [id number]; DOB: 01/22/1951  Admit date: 04/07/2020 Date of Consult: 04/08/2020  Primary Care Provider: Nicoletta Dress, MD Primary Cardiologist: Johnathan Lindau, MD  Primary Electrophysiologist:  None    Patient Profile:   Johnathan Murray is a 69 y.o. male with a history of CAD s/p CABG in 2017 with most recent cath in 01/2020 showing 2/3 grafts occluded, aortic stenosis s/p AVR at time of CABG, paroxysmal atrial fibrillation on Eliquis, hypertension, dyslipidemia, diabetes mellitus, recent cervical discectomy who is currently in CIR following prolonged hospitalization for sepsis. Cardiology consulted today for the evaluation of atrial flutter with RVR at the request of Johnathan Murray.  History of Present Illness:   Johnathan Murray is a 69 year old male with the above history who is followed by Johnathan Murray. Patient underwent stress test in 12/2019 as part of pre-op evaluation for upcoming neck surgery. Stress test came back abnormal with a defect in basal anterolateral and mid anterolateral location. Therefore, she was referred for cardiac catheterization. He underwent left heart catheterization on 01/21/2020 which showed patent LIMA to LAD but occluded SVG to OM and SVG to Diag. Patient felt to be at acceptable risk for surgery. Patient underwent C3-C4 anterior cervical decompression and fusion for treatment of his compressive cervical myelopathy on 03/02/2020. He tolerated the procedure well with no immediate complication and was discharged the next day. However, she then developed progressive weakness, increasing back pain, and poor appetite and presented back to the ED on 03/19/2020 where she was found to be in AKI with creatinine of 6.40 on presentation (baseline 1.3 to 1.6). She was admitted for sepsis likely secondary to Enterococcus faecalis bacteremia but was also found to have acute osteomyelitis discitis of L4-L5 and likely left  lower extremity septic knee. TEE during admission showed no obvious vegetation but increased transaortic gradient across the bioprosthetic valve. She was treated with IV antibiotics. Neurosurgery was consulted for acute osteomyelitis discitis and recommended disc aspiration by IR which was performed on 04/07/2020.  She was also noted to transaminitis on admission which slowly improved throughout admission. This was felt to be secondary to sepsis/hypotension/shock liver. Hepatitis panel non-reactive. Amiodarone was discontinued. Patient was discharged on 04/07/2020 to CIR.   Heart rate noted to be elevated today in the 130's. EKG was ordered and showed atrial flutter with RVR. Cardiology consulted for further evaluation. Patient notes he can feel his heart racing but is otherwise asymptomatic. No chest pain, lightheadedness, dizziness, near syncope/syncope, orthopnea, or PND. He has bilateral knee effusion and reports they may be aspirating his knees again. He had a little swelling in his left leg a couple of days ago but OK now. He had a mile fever of 100.8 last night and is still being treated with IV antibiotics.   Past Medical History:  Diagnosis Date  . Abnormal LFTs (liver function tests) 10/31/2016  . Abnormal nuclear stress test 05/03/2018  . Acid reflux 02/15/2018   not at present  . Acute otitis externa of left ear 11/13/2019  . Acute suppurative otitis media of left ear without spontaneous rupture of tympanic membrane 11/13/2019  . Anemia    low iron  . Aortic stenosis 05/03/2018  . Arthritis 02/15/2018   Overview:  shoulder  . Cancer ([place])    skin cancer on left wrist  . Cervical myelopathy ([place]) 04/22/2019  . Conductive hearing loss of left ear 11/13/2019  . Coronary artery disease involving native coronary  artery of native heart without angina pectoris 05/30/2016  . Coronary artery disease of native artery of native heart with stable angina pectoris ([place])    pt. denies  . Diabetes  mellitus due to underlying condition with unspecified complications ([place]) [id number]  . Dyslipidemia 05/30/2016  . Dyspnea    upon exertion,  as of 06/05/09 not having any recent chest pain  . Dysrhythmia    a fib  . Essential hypertension 05/30/2016  . Heart murmur    Has had an Aortic valve replacement  . Hyperactive gag reflex 02/15/2018  . Hypothyroidism   . Intermittent claudication ([place]) 10/31/2018  . Lumbar stenosis with neurogenic claudication 06/11/2019  . Macular degeneration 02/15/2018  . PAF (paroxysmal atrial fibrillation) ([place]) 05/30/2016  . Pre-operative cardiovascular examination 01/03/2020  . Preoperative cardiovascular examination 04/17/2019  . Pseudogout of left knee 03/21/2020  . Renal insufficiency 01/17/2020  . S/P aortic valve replacement 01/17/2020  . Tinnitus of left ear 11/13/2019    Past Surgical History:  Procedure Laterality Date  . ANTERIOR CERVICAL DECOMP/DISCECTOMY FUSION N/A 04/22/2019   Procedure: Anterior Cervical Discectomy and Fusion, Cervical four-five, Cervical five-six;  Surgeon: Johnathan Larsson, MD;  Location: [place];  Service: Neurosurgery;  Laterality: N/A;  . ANTERIOR CERVICAL DECOMP/DISCECTOMY FUSION N/A 03/02/2020   Procedure: Anterior Cervical Decompression Fusion  - Cervical three-Cervical four;  Surgeon: Johnathan Larsson, MD;  Location: [place];  Service: Neurosurgery;  Laterality: N/A;  . AORTIC VALVE REPLACEMENT    . CARDIAC CATHETERIZATION     several stents  . CARDIAC SURGERY    . CORONARY ARTERY BYPASS GRAFT  2017   patient unsure how many, had at [facility]  . HAND SURGERY    . IR LUMBAR [place] W/IMG GUIDE  04/07/2020  . IRRIGATION AND DEBRIDEMENT KNEE Bilateral 03/24/2020   Procedure: ARTHROSCOPY IRRIGATION AND DEBRIDEMENT BILATERAL  KNEE;  Surgeon: Johnathan Bond, MD;  Location: [place];  Service: Orthopedics;  Laterality: Bilateral;  . KNEE SURGERY    . LEFT HEART CATH AND CORONARY ANGIOGRAPHY N/A 01/21/2020   Procedure: LEFT  HEART CATH AND CORONARY ANGIOGRAPHY;  Surgeon: Johnathan Crome, MD;  Location: [place] [facility];  Service: Cardiovascular;  Laterality: N/A;  . LEFT HEART CATH AND CORS/GRAFTS ANGIOGRAPHY N/A 05/16/2018   Procedure: LEFT HEART CATH AND CORS/GRAFTS ANGIOGRAPHY;  Surgeon: Burnell Blanks, MD;  Location: [place] [facility];  Service: Cardiovascular;  Laterality: N/A;  . LUMBAR LAMINECTOMY/DECOMPRESSION MICRODISCECTOMY Bilateral 06/11/2019   Procedure: Bilateral Lumbar Four-FiveLaminectomy/Foraminotomy;  Surgeon: Johnathan Larsson, MD;  Location: [place];  Service: Neurosurgery;  Laterality: Bilateral;  posterior  . TEE WITHOUT CARDIOVERSION N/A 03/26/2020   Procedure: TRANSESOPHAGEAL ECHOCARDIOGRAM (TEE);  Surgeon: Jerline Pain, MD;  Location: [facility] ENDOSCOPY;  Service: Cardiovascular;  Laterality: N/A;     Home Medications:  Prior to Admission medications   Medication Sig Start Date End Date Taking? Authorizing Provider  amLODipine (NORVASC) 10 MG tablet Take 10 mg by mouth at bedtime.  04/14/17   [provider]  ampicillin 2 g in sodium chloride 0.9 % 100 mL Inject 2 g into the vein every 6 (six) hours. 04/07/20   Alma Friendly, MD  apixaban (ELIQUIS) 5 MG TABS tablet Take 1 tablet (5 mg total) by mouth 2 (two) times daily. 02/04/20   Revankar, Reita Cliche, MD  Coenzyme Q10 (COQ10) 200 MG CAPS Take 200 mg by mouth daily.     [provider]  docusate sodium (COLACE) 100 MG  capsule Take 1 capsule (100 mg total) by mouth 2 (two) times daily. 04/07/20   Alma Friendly, MD  feeding supplement, ENSURE ENLIVE, (ENSURE ENLIVE) LIQD Take 237 mLs by mouth 4 (four) times daily. 04/07/20   Alma Friendly, MD  insulin aspart (NOVOLOG) 100 UNIT/ML injection Inject 0-5 Units into the skin at bedtime. 04/07/20   Alma Friendly, MD  insulin aspart (NOVOLOG) 100 UNIT/ML injection Inject 0-15 Units into the skin 3 (three) times daily with meals. 04/07/20   Alma Friendly, MD    insulin glargine (LANTUS) 100 UNIT/ML injection Inject 0.1 mLs (10 Units total) into the skin 2 (two) times daily. 04/07/20   Alma Friendly, MD  iron polysaccharides (NIFEREX) 150 MG capsule Take 1 capsule (150 mg total) by mouth daily with breakfast. 04/08/20   Alma Friendly, MD  levothyroxine (SYNTHROID) 25 MCG tablet Take 25 mcg by mouth daily before breakfast.  01/23/20   [provider]  metoprolol tartrate (LOPRESSOR) 50 MG tablet TAKE 1 TABLET (50 MG TOTAL) BY MOUTH DAILY AS NEEDED. 03/26/20 06/24/20  Revankar, Reita Cliche, MD  nitroGLYCERIN (NITROSTAT) 0.4 MG SL tablet Place 1 tablet (0.4 mg total) under the tongue every 5 (five) minutes as needed for chest pain. 01/13/20   Revankar, Reita Cliche, MD  oxyCODONE (OXY IR/ROXICODONE) 5 MG immediate release tablet Take 1-2 tablets (5-10 mg total) by mouth every 4 (four) hours as needed for moderate pain or severe pain. 04/07/20   Alma Friendly, MD  senna (SENOKOT) 8.6 MG TABS tablet Take 1 tablet (8.6 mg total) by mouth at bedtime. 04/07/20   Alma Friendly, MD  traZODone (DESYREL) 50 MG tablet Take 1 tablet (50 mg total) by mouth at bedtime as needed for sleep. 04/07/20   Alma Friendly, MD  vitamin B-12 (CYANOCOBALAMIN) 1000 MCG tablet Take 1,000 mcg by mouth daily.    [provider]  vitamin C (ASCORBIC ACID) 500 MG tablet Take 500 mg by mouth daily.     [provider]  zinc gluconate 50 MG tablet Take 50 mg by mouth daily.    [provider]    Inpatient Medications: Scheduled Meds: . amiodarone  400 mg Oral BID  . apixaban  5 mg Oral BID  . vitamin C  500 mg Oral Daily  . Chlorhexidine Gluconate Cloth  6 each Topical BID  . docusate sodium  100 mg Oral BID  . insulin aspart  0-15 Units Subcutaneous TID WC  . insulin aspart  3 Units Subcutaneous TID WC  . insulin glargine  10 Units Subcutaneous BID  . iron polysaccharides  150 mg Oral Q breakfast  . levothyroxine  25 mcg Oral Q0600   . methylPREDNISolone acetate  80 mg Intra-articular Once  . pregabalin  50 mg Oral BID  . senna  1 tablet Oral BID  . vitamin B-12  1,000 mcg Oral Daily   Continuous Infusions: . ampicillin (OMNIPEN) IV 2 g (04/08/20 1620)  . cefTRIAXone (ROCEPHIN)  IV 2 g (04/08/20 1339)   PRN Meds: acetaminophen, bisacodyl, cyclobenzaprine, ondansetron **OR** ondansetron (ZOFRAN) IV, oxyCODONE, sodium chloride flush, sorbitol, traZODone  Allergies:    Allergies  Allergen Reactions  . Atorvastatin Other (See Comments)    Urinary retention   . Nebivolol Diarrhea    Social History:   Social History   Socioeconomic History  . Marital status: Married    Spouse name: [name]  . Number of children: 1  . Years of  education: Not on file  . Highest education level: GED or equivalent  Occupational History    Employer: [employer]  Tobacco Use  . Smoking status: Never Smoker  . Smokeless tobacco: Never Used  Substance and Sexual Activity  . Alcohol use: Not Currently  . Drug use: Never  . Sexual activity: Not Currently  Other Topics Concern  . Not on file  Social History Narrative   He drives a truck for [employer].   He lives with wife in a one-level home.   Highest level of education:  GED      Patient is right-handed. There are 8 steps to enter his home.   Social Determinants of Health   Financial Resource Strain:   . Difficulty of Paying Living Expenses:   Food Insecurity:   . Worried About Charity fundraiser in the Last Year:   . Arboriculturist in the Last Year:   Transportation Needs:   . Film/video editor (Medical):   Marland Kitchen Lack of Transportation (Non-Medical):   Physical Activity:   . Days of Exercise per Week:   . Minutes of Exercise per Session:   Stress:   . Feeling of Stress :   Social Connections:   . Frequency of Communication with Friends and Family:   . Frequency of Social Gatherings with Friends and Family:   . Attends Religious Services:    . Active Member of Clubs or Organizations:   . Attends Archivist Meetings:   Marland Kitchen Marital Status:   Intimate Partner Violence:   . Fear of Current or Ex-Partner:   . Emotionally Abused:   Marland Kitchen Physically Abused:   . Sexually Abused:     Family History:    Family History  Problem Relation Age of Onset  . Prostate cancer Brother   . Heart disease Maternal Grandmother   . Brain cancer Mother      ROS:  Please see the history of present illness.  Review of Systems  Constitutional: Positive for fever and malaise/fatigue.  HENT: Negative for congestion.   Respiratory: Negative for shortness of breath.   Cardiovascular: Positive for palpitations and leg swelling. Negative for chest pain, orthopnea and PND.  Gastrointestinal: Negative for abdominal pain, blood in stool, melena, nausea and vomiting.  Genitourinary: Negative for hematuria.  Musculoskeletal: Positive for back pain and joint pain.  Neurological: Positive for weakness. Negative for dizziness and loss of consciousness.  Endo/Heme/Allergies: Does not bruise/bleed easily.  Psychiatric/Behavioral: Negative for substance abuse.  All other systems reviewed and are negative.   Physical Exam/Data:   Vitals:   04/08/20 1014 04/08/20 1104 04/08/20 1145 04/08/20 1426  BP: 98/76 123/82 108/77 114/60  Pulse: (!) 135 (!) 141 (!) 136 (!) 136  Resp: 17 18  18   Temp:  98.3 F (36.8 C) 100.1 F (37.8 C) 98.5 F (36.9 C)  TempSrc:   Oral   SpO2: 100% 100% 100% 100%  Weight:      Height:        Intake/Output Summary (Last 24 hours) at 04/08/2020 1621 Last data filed at 04/08/2020 1300 Gross per 24 hour  Intake 837 ml  Output 1100 ml  Net -263 ml   Last 3 Weights 04/08/2020 04/07/2020 04/04/2020  Weight (lbs) 181 lb 14.1 oz 181 lb 14.1 oz 180 lb 9.1 oz  Weight (kg) 82.5 kg 82.5 kg 81.905 kg     Body mass index is 26.1 kg/m.  General: 69 y.o. male resting comfortably in  no acute distress. HEENT: Normocephalic and  atraumatic.  Neck: Supple. Heart: Tachycardic with regular rhythm. Distinct S1 and S2. Soft systolic murmur noted. Radial pulses 2+ and equal bilaterally. Lungs: No increased work of breathing. Clear to ausculation bilaterally. No wheezes, rhonchi, or rales.  Abdomen: Soft, non-distended, and non-tender to palpation. Bowel sounds present. MSK: Normal strength and tone for age. Extremities: Bilateral knee effusion but no other lower extremity edema. Neuro: Alert and oriented x3. No focal deficits. Psych: Normal affect. Responds appropriately.  EKG:  The EKG was personally reviewed and demonstrates: Atrial flutter with 2:1 AV conduction. Rate 138 bpm. No acute ST/T changes.   Telemetry:  Telemetry was personally reviewed and demonstrates:  Patient not on telemetry in CIR.  Relevant CV Studies:  Left Heart Catheterization 01/21/2020:  Aortic bioprosthesis was not crossed and therefore hemodynamics in the LV were not recorded.  Patent LIMA to the LAD.  Occluded saphenous vein graft to the marginal.  Occluded saphenous vein graft to the diagonal.  Total occlusion of the mid to proximal LAD.  Patent circumflex with 50% proximal stenosis and segmental diffuse disease extending into the large obtuse marginal.  RCA is dominant.  There is diffuse irregularity noted throughout the proximal to distal vessel with up to 30% in the mid vessel.  Recommendations:  Continue current therapy.  No change in anatomy is noted compared to the prior angiogram performed in 2019.  The abnormal nuclear study with anterolateral ischemia will be a chronic finding and in absence of symptoms does not warrant repeat invasive investigation. _______________  Echocardiogram 03/22/2020: Impressions: 1. There is no obvious vegetation, however transaortic gradients across  the bioprosthetic valve are increased from the prior study on 01/16/2020,  mean gradient 28 mmHg, previously 16 mmHg. Consider TEE for further    evaluation.  2. Left ventricular ejection fraction, by estimation, is 55 to 60%. The  left ventricle has normal function. The left ventricle has no regional  wall motion abnormalities. There is mild concentric left ventricular  hypertrophy. Left ventricular diastolic  parameters are consistent with Grade I diastolic dysfunction (impaired  relaxation). Elevated left atrial pressure.  3. Right ventricular systolic function is normal. The right ventricular  size is normal.  4. Left atrial size was mildly dilated.  5. The mitral valve is normal in structure. Mild mitral valve  regurgitation. No evidence of mitral stenosis.  6. The aortic valve has been repaired/replaced. Aortic valve  regurgitation is not visualized. No aortic stenosis is present. There is a  23 mm Edwards-SAPIEN valve present in the aortic position. Aortic valve  mean gradient measures 28.0 mmHg.  7. The inferior vena cava is normal in size with greater than 50%  respiratory variability, suggesting right atrial pressure of 3 mmHg. _______________  TEE 03/26/2020: Impressions: 1. Left ventricular ejection fraction, by estimation, is 60 to 65%. The  left ventricle has normal function. The left ventricle has no regional  wall motion abnormalities.  2. Right ventricular systolic function is normal. The right ventricular  size is normal. There is normal pulmonary artery systolic pressure.  3. No left atrial/left atrial appendage thrombus was detected.  4. The mitral valve is myxomatous. Moderate mitral valve regurgitation.  No evidence of mitral stenosis.  5. The aortic valve has been repaired/replaced. Aortic valve  regurgitation is not visualized. No aortic stenosis is present. There is a  23 mm Edwards bioprosthetic valve present in the aortic position.  Procedure Date: 08/16/2016. Echo findings are  consistent with normal  structure and function of the aortic valve  prosthesis.  6. The inferior vena cava is  normal in size with greater than 50%  respiratory variability, suggesting right atrial pressure of 3 mmHg.   Conclusion(s)/Recommendation(s): Normal biventricular function without  evidence of hemodynamically significant valvular heart disease. No  evidence of vegetation/infective endocarditis on this transesophageal  echocardiogram.   Laboratory Data:  High Sensitivity Troponin:   Recent Labs  Lab 03/20/20 0331  TROPONINIHS 48*     Chemistry Recent Labs  Lab 04/06/20 0438 04/07/20 0432 04/08/20 1131  NA 136 136 135  K 4.1 4.2 4.0  CL 101 101 100  CO2 28 26 25   GLUCOSE 156* 175* 226*  BUN 18 21 22   CREATININE 1.31* 1.32* 1.60*  CALCIUM 8.8* 9.0 9.0  GFRNONAA 56* 55* 44*  GFRAA >60 >60 51*  ANIONGAP 7 9 10     Recent Labs  Lab 04/08/20 1131  PROT 6.2*  ALBUMIN 1.9*  AST 60*  ALT 94*  ALKPHOS 106  BILITOT 1.2   Hematology Recent Labs  Lab 04/06/20 0438 04/07/20 0432 04/08/20 1131  WBC 9.7 7.7 10.5  RBC 2.47* 2.54* 2.64*  HGB 7.4* 7.6* 7.7*  HCT 23.3* 23.9* 24.6*  MCV 94.3 94.1 93.2  MCH 30.0 29.9 29.2  MCHC 31.8 31.8 31.3  RDW 15.4 15.5 15.6*  PLT 308 340 330   BNPNo results for input(s): BNP, PROBNP in the last 168 hours.  DDimer No results for input(s): DDIMER in the last 168 hours.   Radiology/Studies:  IR LUMBAR DISC ASPIRATION W/IMG GUIDE  Result Date: 04/07/2020 INDICATION: 69 year old male with history of paroxysmal atrial fibrillation, hypertension, bioprosthetic aortic valve replacement who had undergone recent cervical discectomy and fusion about 3 weeks ago. Admitted now with enterococcal bacteremia, sepsis now s/p 2 weeks of IV antibiotics. Patient was progressing well until the end of last week when he developed new back pain. MRI of the lumbar spine is concerning for L4-5 discitis. He comes or service for a fluoroscopy guided fine needle aspiration. EXAM: Fluoroscopy guided L4-5 disc fine needle aspiration MEDICATIONS: The patient is  currently admitted to the hospital and receiving intravenous antibiotics. The antibiotics were administered within an appropriate time frame prior to the initiation of the procedure. ANESTHESIA/SEDATION: Fentanyl 2 mcg IV; Versed 50 mg IV Moderate Sedation Time:  13 The patient was continuously monitored during the procedure by the interventional radiology nurse under my direct supervision. COMPLICATIONS: None immediate. PROCEDURE: Informed written consent was obtained from the patient after a thorough discussion of the procedural risks, benefits and alternatives. All questions were addressed. Maximal Sterile Barrier Technique was utilized including caps, mask, sterile gowns, sterile gloves, sterile drape, hand hygiene and skin antiseptic. A timeout was performed prior to the initiation of the procedure. The patient was made to lie prone on the angiography table. The lower lumbar spine was prepped and draped in a sterile fashion. Under fluoroscopy, the L4-5 disc space was delineated and the skin area was marked. The skin was infiltrated with a 1% Lidocaine approximately 5 cm lateral to the spinous process projection on the right. Then, an 18 gauge by 15 cm spinal needle was advanced under fluoroscopy into the L4-5 disc space. A 22 gauge x 20 cm spinal needle was then advanced through the 18 gauge needle into the L4-5 disc. The disc was aspirated and serosanguineous fluid was obtained and sent into 2 capped syringes to the laboratory for microbiology analysis. The needle was subsequently withdrawn. The access site  was cleaned and covered with a sterile bandage. IMPRESSION: Successful and uncomplicated fluoroscopy guided fine needle aspiration of the L4-5 disc. Sample sent for culture and staining. Electronically Signed   By: Pedro Earls M.D.   On: 04/07/2020 14:24    Assessment and Plan:   Atrial Flutter with RVR/ History of Paroxysmal Atrial Fibrillation - Patient went into atrial flutter  with RVR earlier today in setting of recent sepsis with bacteremia, acute osteomyelitis of lumbar spine, and septic knee. - EKG shows atrial flutter with 2:1 AV conduction. Rates in the 130's. - Recent TEE showed normal LVEF. - Potassium 4.0. Keep >4.0. - Will check Magnesium.  - TSH normal on 4/30. Can recheck. - Will likely be difficult to rate control but will try. Home Amiodarone currently on hold due to transminitis which was felt to be due to sepsis/shock liver. LFTs trending down and only minimally elevated now. Will restart PO Amiodarone at 400mg  twice daily.  - Continue Eliquis 5mg  twice daily. - If DCCV is ultimately need, may need TEE beforehand as Eliquis had to be stopped recently for procedures (aspiration of knee and back).  CAD s/p CABG - Recent cath in 01/2020 for preo-op evaluation showed 2/3 occluded grafts with patient LIMA to LAD. Continued medical therapy recommended.  - Stable. No chest pain. - Not on Aspirin due to need for anticoagulation. - On Zetia at home for hyperlipidemia but currently on hold due to transaminitis.   Aortic Stenosis s/p AVR - S/p bioprosthetic AVR in 2017 at time of CABG.  - Recent TEE on 5/6 showed normal structure and function of valve prothesis.  Hypertension - BP soft at times but stable. - Will hold Amlodipine give soft BP at times but can restart if needed.   Hyperlipidemia - Restart Zetia when LFTs normalize.  Otherwise, per primary team: - Enterococcus bacteremia - Acute osteomyelitis/discisitis of L4-L5 - Septic knee - Diabetes   For questions or updates, please contact [facility] Please consult [email or web address] for contact info under     Signed, Candee Furbish, MD  04/08/2020 4:21 PM  Personally seen and examined. Agree with above.   69 year old with discitis, coronary disease post CABG, aortic stenosis post AVR, paroxysmal atrial fibrillation/flutter previously on amiodarone, currently on Eliquis with intermittent hold  secondary to surgical procedures currently in rehabilitation with return onset of atrial flutter at 130's bpm this morning.  He made it clear that amiodarone works best for him.  He has tried metoprolol in the past without success.  Fatigue.  GEN: Thin, in no acute distress  HEENT: normal  Neck: no JVD, carotid bruits, or masses Cardiac: Tachycardic regular; no murmurs, rubs, or gallops,no edema  Respiratory:  clear to auscultation bilaterally, normal work of breathing GI: soft, nontender, nondistended, + BS MS: no deformity or atrophy  Skin: warm and dry, no rash, pale Neuro:  Alert and Oriented x 3, Strength and sensation are intact Psych: euthymic mood, full affect  AST 60, ALT 94 (previously in the 500 range during sepsis) Hemoglobin 7.7 T-max 100.8  EKG personally reviewed shows 2-1 atrial flutter 138 bpm.  Assessment and plan:  Atrial flutter -Paroxysmal in nature.  Also has atrial fibrillation at times.  Currently on Eliquis.  We will go ahead and resume his amiodarone, loading him back with 400 mg twice daily.  This was recently stopped in the setting of his shock liver in the setting of sepsis.  With his LFTs trending downward, I think it is  reasonable to resume.  He is very aware of his atrial flutter, symptomatically and is desiring to resume amiodarone.  He has been through this before with Johnathan Murray.  Has tried metoprolol without success. -Continued antibiotics for discitis.  Fever, adrenergic tone is not helping.  Chronic anticoagulation -Continue with Eliquis.  Recent TEE showed no evidence of thrombus.  He has had occasional short-term discontinuation of Eliquis secondary to surgical procedure.  Risk of thrombotic event is quite low.  CAD -Stable.   We will continue to follow.  Candee Furbish, MD

## 2020-04-08 NOTE — Procedures (Signed)
Procedure: Left knee aspiration and injection  Indication: Left knee effusion(s)  Surgeon: Silvestre Gunner, PA-C  Assist: None  Anesthesia: Topical refrigerant  EBL: None  Complications: None  Findings: After risks/benefits explained patient desires to undergo procedure. Consent obtained and time out performed. The left knee was sterilely prepped and aspirated. 44ml bloody fluid obtained. 51ml 0.5% Marcaine instilled. Pt tolerated the procedure well.    Lisette Abu, PA-C Orthopedic Surgery [phone]

## 2020-04-09 ENCOUNTER — Inpatient Hospital Stay (HOSPITAL_COMMUNITY): Payer: BC Managed Care – PPO | Admitting: Occupational Therapy

## 2020-04-09 ENCOUNTER — Inpatient Hospital Stay (HOSPITAL_COMMUNITY): Payer: BC Managed Care – PPO | Admitting: Physical Therapy

## 2020-04-09 ENCOUNTER — Inpatient Hospital Stay (HOSPITAL_COMMUNITY): Payer: BC Managed Care – PPO

## 2020-04-09 DIAGNOSIS — M4647 Discitis, unspecified, lumbosacral region: Secondary | ICD-10-CM

## 2020-04-09 LAB — GLUCOSE, CAPILLARY
Glucose-Capillary: 160 mg/dL — ABNORMAL HIGH (ref 70–99)
Glucose-Capillary: 203 mg/dL — ABNORMAL HIGH (ref 70–99)
Glucose-Capillary: 67 mg/dL — ABNORMAL LOW (ref 70–99)
Glucose-Capillary: 69 mg/dL — ABNORMAL LOW (ref 70–99)
Glucose-Capillary: 112 mg/dL — ABNORMAL HIGH (ref 70–99)
Glucose-Capillary: 128 mg/dL — ABNORMAL HIGH (ref 70–99)

## 2020-04-09 LAB — COMPREHENSIVE METABOLIC PANEL
ALT: 76 U/L — ABNORMAL HIGH (ref 0–44)
AST: 38 U/L (ref 15–41)
Albumin: 1.9 g/dL — ABNORMAL LOW (ref 3.5–5.0)
Alkaline Phosphatase: 93 U/L (ref 38–126)
Anion gap: 8 (ref 5–15)
BUN: 19 mg/dL (ref 8–23)
CO2: 27 mmol/L (ref 22–32)
Calcium: 8.7 mg/dL — ABNORMAL LOW (ref 8.9–10.3)
Chloride: 100 mmol/L (ref 98–111)
Creatinine, Ser: 1.4 mg/dL — ABNORMAL HIGH (ref 0.61–1.24)
GFR calc Af Amer: 60 mL/min — ABNORMAL LOW (ref >60)
GFR calc non Af Amer: 52 mL/min — ABNORMAL LOW (ref >60)
Glucose, Bld: 163 mg/dL — ABNORMAL HIGH (ref 70–99)
Potassium: 3.8 mmol/L (ref 3.5–5.1)
Sodium: 135 mmol/L (ref 135–145)
Total Bilirubin: 0.9 mg/dL (ref 0.3–1.2)
Total Protein: 6.1 g/dL — ABNORMAL LOW (ref 6.5–8.1)

## 2020-04-09 MED ORDER — PRO-STAT SUGAR FREE PO LIQD
30.0000 mL | Freq: Three times a day (TID) | ORAL | Status: DC
  Administered 2020-04-09 – 2020-04-13 (×4): 30 mL via ORAL
  Filled 2020-04-09 (×11): qty 30

## 2020-04-09 MED ORDER — ENSURE MAX PROTEIN PO LIQD
11.0000 [oz_av] | Freq: Two times a day (BID) | ORAL | Status: DC
  Administered 2020-04-09 – 2020-04-13 (×9): 11 [oz_av] via ORAL
  Filled 2020-04-09 (×11): qty 330

## 2020-04-09 MED ORDER — OXYCODONE HCL ER 10 MG PO T12A
10.0000 mg | EXTENDED_RELEASE_TABLET | Freq: Two times a day (BID) | ORAL | Status: DC
  Administered 2020-04-09 – 2020-04-13 (×9): 10 mg via ORAL
  Filled 2020-04-09 (×9): qty 1

## 2020-04-09 MED ORDER — CYCLOBENZAPRINE HCL 5 MG PO TABS: 5.0000 mg | ORAL_TABLET | Freq: Three times a day (TID) | ORAL | Status: DC | PRN

## 2020-04-09 MED ORDER — PREGABALIN 50 MG PO CAPS
100.0000 mg | ORAL_CAPSULE | Freq: Two times a day (BID) | ORAL | Status: DC
  Administered 2020-04-09 – 2020-04-11 (×4): 100 mg via ORAL
  Filled 2020-04-09: qty 4
  Filled 2020-04-09 (×2): qty 2
  Filled 2020-04-09: qty 4

## 2020-04-09 NOTE — Progress Notes (Signed)
MEWS RR 0.

## 2020-04-09 NOTE — Progress Notes (Addendum)
RR WNL. MEWS RR should be 0.

## 2020-04-09 NOTE — Progress Notes (Signed)
Occupational Therapy Session Note  Patient Details  Name: Johnathan Murray MRN: [id number] Date of Birth: May 29, 1951  Today's Date: 04/09/2020 OT Individual Time: 1005-1045 OT Individual Time Calculation (min): 40 min  and Today's Date: 04/09/2020  30 missed time secondary to pain and fatigue    Short Term Goals: Week 1:  OT Short Term Goal 1 (Week 1): Patient will thread BLE through LB clothing with Min A and AE PRN. OT Short Term Goal 2 (Week 1): Patient will complete toilet transfer to [facility] with LRAD and Min A. OT Short Term Goal 3 (Week 1): Patient will complete 3/3 parts of toileting task in sitting/standing.  Skilled Therapeutic Interventions/Progress Updates:    Upon entering the room, pt supine in side lying position with wife present in room. Pt reports increased pain this session and feeling like he was in a comfortable position. Pt does request assistance to change LB clothing from bed level. Pt rolling L <> R with mod A and min cuing for proper technique with log rolls for pain management. Pt needing total A to doff dirty clothing items and to don clean shorts. OT recommended pt just wear shorts only in order to be able to manage clothing for urinal use with greater ease. Pt needing increased time and extra cues with mobility tasks secondary to anticipating pain with movement. OT repositioned pt in bed with head lower in bed and elevated LEs which pt reports being a comfortable position at this time. Pt remained in bed with call bell and all needed items within reach. Caregiver remains in room. Pt wishing to rest at this time in hopes of participation in afternoon session.   Therapy Documentation Precautions:  Precautions Precautions: Fall, Cervical, Other (comment)(Spinal) Precaution Comments: ACDF on 4/12, pt reports they d/c-ed collar 1 week after surgery Required Braces or Orthoses: Spinal Brace Spinal Brace: Lumbar corset Other Brace: for comfort Restrictions Weight  Bearing Restrictions: No General: General OT Amount of Missed Time: 30 Minutes Vital Signs: Therapy Vitals Temp: 98.8 F (37.1 C) Temp Source: Oral Pulse Rate: (!) 141 Resp: 17 BP: 103/77 Patient Position (if appropriate): Sitting Oxygen Therapy SpO2: 100 % O2 Device: Room Air Pain: Pain Assessment Pain Scale: 0-10 Pain Score: 6  Pain Type: Acute pain Pain Location: Pelvis Pain Orientation: Right;Left Pain Descriptors / Indicators: Stabbing Pain Frequency: Constant Pain Onset: On-going Patients Stated Pain Goal: 2 Pain Intervention(s): Medication (See eMAR) ADL: ADL Upper Body Bathing: Minimal assistance Where Assessed-Upper Body Bathing: Edge of bed Lower Body Bathing: Moderate assistance Where Assessed-Lower Body Bathing: Edge of bed Upper Body Dressing: Minimal assistance Where Assessed-Upper Body Dressing: Edge of bed Lower Body Dressing: Moderate assistance Where Assessed-Lower Body Dressing: Edge of bed Toileting: Maximal assistance Where Assessed-Toileting: Bedside Commode Toilet Transfer: Moderate assistance Toilet Transfer Method: Stand pivot Toilet Transfer Equipment: Art gallery manager    Praxis   Exercises:   Other Treatments:     Therapy/Group: Individual Therapy  Gypsy Decant 04/09/2020, 11:03 AM

## 2020-04-09 NOTE — Progress Notes (Signed)
PHYSICAL MEDICINE & REHABILITATION PROGRESS NOTE   Subjective/Complaints:   Pt reports pain is so bad, in back and hips, he feels like "he got shot".  Stabbing-said pain started since had osteomyelitis.   LBM yesterday.   Still in Afib. Fast per pt.  Tried to aspirate L knee- but got almost nothing out.  Added rocephin to ABX-   ROS:  Pt denies SOB, abd pain, CP, N/V/C/D, and vision changes   Objective:   IR LUMBAR DISC ASPIRATION W/IMG GUIDE  Result Date: 04/07/2020 INDICATION: 69 year old male with history of paroxysmal atrial fibrillation, hypertension, bioprosthetic aortic valve replacement who had undergone recent cervical discectomy and fusion about 3 weeks ago. Admitted now with enterococcal bacteremia, sepsis now s/p 2 weeks of IV antibiotics. Patient was progressing well until the end of last week when he developed new back pain. MRI of the lumbar spine is concerning for L4-5 discitis. He comes or service for a fluoroscopy guided fine needle aspiration. EXAM: Fluoroscopy guided L4-5 disc fine needle aspiration MEDICATIONS: The patient is currently admitted to the hospital and receiving intravenous antibiotics. The antibiotics were administered within an appropriate time frame prior to the initiation of the procedure. ANESTHESIA/SEDATION: Fentanyl 2 mcg IV; Versed 50 mg IV Moderate Sedation Time:  13 The patient was continuously monitored during the procedure by the interventional radiology nurse under my direct supervision. COMPLICATIONS: None immediate. PROCEDURE: Informed written consent was obtained from the patient after a thorough discussion of the procedural risks, benefits and alternatives. All questions were addressed. Maximal Sterile Barrier Technique was utilized including caps, mask, sterile gowns, sterile gloves, sterile drape, hand hygiene and skin antiseptic. A timeout was performed prior to the initiation of the procedure. The patient was made to lie prone  on the angiography table. The lower lumbar spine was prepped and draped in a sterile fashion. Under fluoroscopy, the L4-5 disc space was delineated and the skin area was marked. The skin was infiltrated with a 1% Lidocaine approximately 5 cm lateral to the spinous process projection on the right. Then, an 18 gauge by 15 cm spinal needle was advanced under fluoroscopy into the L4-5 disc space. A 22 gauge x 20 cm spinal needle was then advanced through the 18 gauge needle into the L4-5 disc. The disc was aspirated and serosanguineous fluid was obtained and sent into 2 capped syringes to the laboratory for microbiology analysis. The needle was subsequently withdrawn. The access site was cleaned and covered with a sterile bandage. IMPRESSION: Successful and uncomplicated fluoroscopy guided fine needle aspiration of the L4-5 disc. Sample sent for culture and staining. Electronically Signed   By: Pedro Earls M.D.   On: 04/07/2020 14:24   Recent Labs    04/07/20 0432 04/08/20 1131  WBC 7.7 10.5  HGB 7.6* 7.7*  HCT 23.9* 24.6*  PLT 340 330   Recent Labs    04/08/20 1131 04/09/20 0411  NA 135 135  K 4.0 3.8  CL 100 100  CO2 25 27  GLUCOSE 226* 163*  BUN 22 19  CREATININE 1.60* 1.40*  CALCIUM 9.0 8.7*    Intake/Output Summary (Last 24 hours) at 04/09/2020 0853 Last data filed at 04/09/2020 0300 Gross per 24 hour  Intake 1460.88 ml  Output 2 ml  Net 1458.88 ml     Physical Exam: Vital Signs Blood pressure 103/77, pulse (!) 141, temperature 98.8 F (37.1 C), temperature source Oral, resp. rate 17, height 5\' 10"  (1.778 m), weight 82.5 kg, SpO2  100 %.  Physical Exam Constitutional: awake, supine in bed; upset about pain, wife at bedside, NAD  HENT:  Conjugate gaze Cardiovascular:rate 130s-140s- RVR in Afib Respiratory:CTA B/L- good air movement [id number], NT, ND, (+)BS.  Musculoskeletal:  Cervical back: Normal range of motion.  Comments: Both knees swollen  and tender- L knee specifically suprapatella bursitis noted . Low back TTP and he complains of "stabbing pain". . Neurological: He is alertand oriented to person, place, and time. Nocranial nerve deficit. UE motor 5/5. LE 3-/5 prox (pain inhibition from back/knees) to 4/5 with ADF/PF. No focal sensory loss. DTR's 1+ Skin: Skin iswarm. He isnot diaphoretic.  Psychiatric:frustrated about pain    Assessment/Plan: 1. Functional deficits secondary to Discitis/Osteomyelitis  which require 3+ hours per day of interdisciplinary therapy in a comprehensive inpatient rehab setting.  Physiatrist is providing close team supervision and 24 hour management of active medical problems listed below.  Physiatrist and rehab team continue to assess barriers to discharge/monitor patient progress toward functional and medical goals  Care Tool:  Bathing    Body parts bathed by patient: Right arm, Left arm, Chest, Abdomen, Front perineal area, Right upper leg, Left upper leg, Face   Body parts bathed by helper: Right lower leg, Buttocks, Left lower leg     Bathing assist Assist Level: Moderate Assistance - Patient 50 - 74%     Upper Body Dressing/Undressing Upper body dressing Upper body dressing/undressing activity did not occur (including orthotics): N/A What is the patient wearing?: Pull over shirt    Upper body assist Assist Level: Minimal Assistance - Patient > 75%    Lower Body Dressing/Undressing Lower body dressing      What is the patient wearing?: Pants, Underwear/pull up     Lower body assist Assist for lower body dressing: Moderate Assistance - Patient 50 - 74%     Toileting Toileting    Toileting assist Assist for toileting: 2 Helpers     Transfers Chair/bed transfer  Transfers assist  Chair/bed transfer activity did not occur: N/A  Chair/bed transfer assist level: Moderate Assistance - Patient 50 - 74%     Locomotion Ambulation   Ambulation assist       Assist level: Moderate Assistance - Patient 50 - 74% Assistive device: Walker-rolling Max distance: 5'   Walk 10 feet activity   Assist  Walk 10 feet activity did not occur: Safety/medical concerns        Walk 50 feet activity   Assist Walk 50 feet with 2 turns activity did not occur: Safety/medical concerns         Walk 150 feet activity   Assist Walk 150 feet activity did not occur: Safety/medical concerns         Walk 10 feet on uneven surface  activity   Assist Walk 10 feet on uneven surfaces activity did not occur: Safety/medical concerns         Wheelchair     Assist Will patient use wheelchair at discharge?: Yes(pending progress and pain management) Type of Wheelchair: Manual    Wheelchair assist level: Supervision/Verbal cueing Max wheelchair distance: 150'    Wheelchair 50 feet with 2 turns activity    Assist        Assist Level: Supervision/Verbal cueing   Wheelchair 150 feet activity     Assist      Assist Level: Supervision/Verbal cueing   Blood pressure 103/77, pulse (!) 141, temperature 98.8 F (37.1 C), temperature source Oral, resp. rate 17, height 5\' 10"  (  1.778 m), weight 82.5 kg, SpO2 100 %.  Medical Problem List and Plan: 1.Decreased functional mobilitysecondary to sepsis/Enterococcusfaecalisbacteremia with septic knees and lumbar discitis/osteomyelitis -patient may shower -ELOS/Goals: 12-14 days, supervision to mod I 2. Antithrombotics: -DVT/anticoagulation:Eliquis 5mg  BID -antiplatelet therapy: N/A 3. Pain Management:Flexeril as needed, oxycodone as needed -heat, kpad as appropriate, lumbar corset, ice for knees. -ROM, core strengthening with therapies  5/20- added Oxycontin 10 mg BID, increased Lyrica to 100 mg BID and changed Flexeril to 5 mg TID prn for muscle spasms, since so sedating.  4. Mood:Trazodone as  needed -antipsychotic agents: N/C 5. Neuropsych: This patientiscapable of making decisions on hisown behalf. 6. Skin/Wound Care:Routine skin checks 7. Fluids/Electrolytes/Nutrition:Routine in and outs with follow-up chemistries 8. Acute osteomyelitis/discitis L4-5. Neurosurgery follow-up no surgical intervention.Lumbar corsetfor comfort.  -S/p FNA biopsy L4-5 disc today.Await aspiration results  5/20- ID put pt also on Rocephin as well   9.Recent C3-4 ACDF 03/02/2020 per [name] for cervical myelopathy. Cervical collar has been discontinued. 10. Septic knee. Status post aspiration and steroid injection with I&D of bilateral knees 03/24/2020. Follow-up orthopedic services  5/20- they attempted another aspiration- not real effective per pt- still has suprapatellar bursitis type swelling 11. ID/Enterococcus bacteremia. Continue intravenous ampicillin through 05/20/2020. Follow-up infectious disease  5/20- on Ampicillin AND now Rocephin til 05/20/20 12. PAF/bioprosthetic aortic valve replacement 2017. Await plan to resume Eliquis. Amiodarone currently on hold  5/19- Back in Afib with RVR- called Cards to see if can restart Amiodarone.   5/20- restarted Amio at 400 mg BID- rate still 130s-140 13. AKI. Follow-up chemistries 14. Diabetes mellitus. Hemoglobin A1c 6.8. Currently on NovoLog 3 units 3 times daily as well as Lantus insulin 10 units twice daily. Patient on Glucophage prior to admission 500 mg nightly. Currently on hold due to AKI. Titrate regime to effect CBG (last 3)  Recent Labs    04/08/20 1643 04/08/20 2129 04/09/20 0557  GLUCAP 148* 175* 160*    5/19- BGs 130s-190s- con't regimen  5/20- BGs 148-175- con't regimen 15. Acute on chronic anemia. Continue Niferex. Follow-up CBC 16. Hypothyroidism. Synthroid 17. Nerve pain in RLE- will add Lyrica 50 mg BID and monitor   5/20- increased to 100 mg BID  I spent a total of 40  minutes on total care- with talking to pt 2x today and wife about care and pain regimen.   LOS: 2 days A FACE TO FACE EVALUATION WAS PERFORMED  Johnathan Murray 04/09/2020, 8:53 AM

## 2020-04-09 NOTE — Progress Notes (Signed)
   04/08/20 2345  Assess: MEWS Score  Temp 98.7 F (37.1 C)  BP 107/77  Pulse Rate (!) 140  Resp 19  SpO2 99 %  O2 Device Room Air  Assess: MEWS Score  MEWS Temp 0  MEWS Systolic 0  MEWS Pulse 3  MEWS RR 0  MEWS LOC 0  MEWS Score 3  MEWS Score Color Yellow  Assess: if the MEWS score is Yellow or Red  Were vital signs taken at a resting state? Yes  Focused Assessment Documented focused assessment  Early Detection of Sepsis Score *See Row Information* Medium  MEWS guidelines implemented *See Row Information* No, previously yellow, continue vital signs every 4 hours  Treat  MEWS Interventions Escalated (See documentation below)  Escalate  MEWS: Escalate Yellow: discuss with charge nurse/RN and consider discussing with provider and RRT  Notify: Charge Nurse/RN  Name of Charge Nurse/RN Notified Jeanie Cooks  Date Charge Nurse/RN Notified 04/08/20  Time Charge Nurse/RN Notified 2355  Notify: Provider  Provider Name/Title Dr. Kalman Shan  Date Provider Notified 04/09/20  Time Provider Notified 0017  Notification Type Call  Notification Reason Other (Comment) (HR increased from prior assessment)  Response No new orders  Date of Provider Response 04/09/20  Time of Provider Response 0017  Notify: Rapid Response  Name of Rapid Response RN Notified Mindy  Date Rapid Response Notified 04/09/20  Time Rapid Response Notified 0005  Document  Patient Outcome Other (Comment) (HR unchanged, no new orders from cardio)  Progress note created (see row info) Yes

## 2020-04-09 NOTE — Progress Notes (Signed)
Occupational Therapy Session Note  Patient Details  Name: Johnathan Murray MRN: [id number] Date of Birth: Apr 14, 1951  Today's Date: 04/09/2020 OT Individual Time: 1400-1438 OT Individual Time Calculation (min): 38 min    Short Term Goals: Week 1:  OT Short Term Goal 1 (Week 1): Patient will thread BLE through LB clothing with Min A and AE PRN. OT Short Term Goal 2 (Week 1): Patient will complete toilet transfer to [facility] with LRAD and Min A. OT Short Term Goal 3 (Week 1): Patient will complete 3/3 parts of toileting task in sitting/standing.  Skilled Therapeutic Interventions/Progress Updates:  Patient met lying supine in bed in agreement with bed level/EOB activity this treatment session 2/2 back pain. Vitals monitored throughout 2/2 Afib. Patient did not specify number on pain scale. Bed mobility with focus on rolling R<>L with use of log rolling technique to decrease risk for additional pressure injuries to sacral area and decrease burden of care. Patient able to bend bilateral knees with feet flat on bed surface. Bridging in supine with Mod-Max A. Supine to EOB with Mod A at trunk with use of bed rail. Patient tolerated sitting at EOB for ~15 minutes. Sit to stand from elevated EOB with Mod A and increased time. Lateral scoots to R with Mod A and increased time. Return to supine with Min A at BLE and anterior scoot toward [facility] with use of bed features and Min A. Session concluded with patient lying supine in bed with call bell within reach, bed alarm activated, and all needs met.   Therapy Documentation Precautions:  Precautions Precautions: Fall, Cervical, Other (comment)(Spinal) Precaution Comments: ACDF on 4/12, pt reports they d/c-ed collar 1 week after surgery Required Braces or Orthoses: Spinal Brace Spinal Brace: Lumbar corset Other Brace: for comfort Restrictions Weight Bearing Restrictions: No General: General OT Amount of Missed Time: 22 Minutes  Therapy/Group: Individual  Therapy  Clariece Roesler R [place]Davis 04/09/2020, 2:43 PM

## 2020-04-09 NOTE — Progress Notes (Addendum)
Progress Note  Patient Name: Johnathan Murray Date of Encounter: 04/09/2020  Primary Cardiologist: Jenean Lindau, MD   Subjective   He is agreeable to DCCV if needed. Tolerating rate and rhythm, but very fatigued. Increased pain in both knees. OK to work with physical therapy when bed or chair is available to quickly sit should he become lightheaded with activity that may increase his heart rate.  Inpatient Medications    Scheduled Meds: . amiodarone  400 mg Oral BID  . apixaban  5 mg Oral BID  . vitamin C  500 mg Oral Daily  . Chlorhexidine Gluconate Cloth  6 each Topical BID  . docusate sodium  100 mg Oral BID  . insulin aspart  0-15 Units Subcutaneous TID WC  . insulin aspart  3 Units Subcutaneous TID WC  . insulin glargine  10 Units Subcutaneous BID  . iron polysaccharides  150 mg Oral Q breakfast  . levothyroxine  25 mcg Oral Q0600  . methylPREDNISolone acetate  80 mg Intra-articular Once  . oxyCODONE  10 mg Oral Q12H  . pregabalin  100 mg Oral BID  . senna  1 tablet Oral BID  . vitamin B-12  1,000 mcg Oral Daily   Continuous Infusions: . ampicillin (OMNIPEN) IV 2 g (04/09/20 0541)  . cefTRIAXone (ROCEPHIN)  IV 2 g (04/09/20 0827)   PRN Meds: acetaminophen, bisacodyl, cyclobenzaprine, ondansetron **OR** ondansetron (ZOFRAN) IV, oxyCODONE, sodium chloride flush, sorbitol, traZODone   Vital Signs    Vitals:   04/08/20 1953 04/08/20 2345 04/09/20 0345 04/09/20 0741  BP: 95/64 107/77 111/70 103/77  Pulse: (!) 133 (!) 140 (!) 138 (!) 141  Resp: 16 19 18 17   Temp: 98.5 F (36.9 C) 98.7 F (37.1 C) 99.5 F (37.5 C) 98.8 F (37.1 C)  TempSrc: Oral  Oral Oral  SpO2: 100% 99% 99% 100%  Weight:      Height:        Intake/Output Summary (Last 24 hours) at 04/09/2020 0856 Last data filed at 04/09/2020 0300 Gross per 24 hour  Intake 1460.88 ml  Output 2 ml  Net 1458.88 ml   Last 3 Weights 04/08/2020 04/07/2020 04/04/2020  Weight (lbs) 181 lb 14.1 oz 181 lb  14.1 oz 180 lb 9.1 oz  Weight (kg) 82.5 kg 82.5 kg 81.905 kg      Telemetry    N/A - Personally Reviewed  ECG    No new tracings - Personally Reviewed  Physical Exam   GEN: elderly male, fatigued and with moderate pain with movement   Neck: No JVD Cardiac: irregular rhythm, tachycardic rate Respiratory: Clear to auscultation bilaterally. GI: Soft, nontender, non-distended  MS: No edema; B knee swelling L > R Neuro:  Nonfocal  Psych: Normal affect   Labs    High Sensitivity Troponin:   Recent Labs  Lab 03/20/20 0331  TROPONINIHS 48*      Chemistry Recent Labs  Lab 04/07/20 0432 04/08/20 1131 04/09/20 0411  NA 136 135 135  K 4.2 4.0 3.8  CL 101 100 100  CO2 26 25 27   GLUCOSE 175* 226* 163*  BUN 21 22 19   CREATININE 1.32* 1.60* 1.40*  CALCIUM 9.0 9.0 8.7*  PROT  --  6.2* 6.1*  ALBUMIN  --  1.9* 1.9*  AST  --  60* 38  ALT  --  94* 76*  ALKPHOS  --  106 93  BILITOT  --  1.2 0.9  GFRNONAA 55* 44* 52*  GFRAA >60  51* 60*  ANIONGAP 9 10 8      Hematology Recent Labs  Lab 04/06/20 0438 04/07/20 0432 04/08/20 1131  WBC 9.7 7.7 10.5  RBC 2.47* 2.54* 2.64*  HGB 7.4* 7.6* 7.7*  HCT 23.3* 23.9* 24.6*  MCV 94.3 94.1 93.2  MCH 30.0 29.9 29.2  MCHC 31.8 31.8 31.3  RDW 15.4 15.5 15.6*  PLT 308 340 330    BNPNo results for input(s): BNP, PROBNP in the last 168 hours.   DDimer No results for input(s): DDIMER in the last 168 hours.   Radiology    IR LUMBAR DISC ASPIRATION W/IMG GUIDE  Result Date: 04/07/2020 INDICATION: 69 year old male with history of paroxysmal atrial fibrillation, hypertension, bioprosthetic aortic valve replacement who had undergone recent cervical discectomy and fusion about 3 weeks ago. Admitted now with enterococcal bacteremia, sepsis now s/p 2 weeks of IV antibiotics. Patient was progressing well until the end of last week when he developed new back pain. MRI of the lumbar spine is concerning for L4-5 discitis. He comes or service  for a fluoroscopy guided fine needle aspiration. EXAM: Fluoroscopy guided L4-5 disc fine needle aspiration MEDICATIONS: The patient is currently admitted to the hospital and receiving intravenous antibiotics. The antibiotics were administered within an appropriate time frame prior to the initiation of the procedure. ANESTHESIA/SEDATION: Fentanyl 2 mcg IV; Versed 50 mg IV Moderate Sedation Time:  13 The patient was continuously monitored during the procedure by the interventional radiology nurse under my direct supervision. COMPLICATIONS: None immediate. PROCEDURE: Informed written consent was obtained from the patient after a thorough discussion of the procedural risks, benefits and alternatives. All questions were addressed. Maximal Sterile Barrier Technique was utilized including caps, mask, sterile gowns, sterile gloves, sterile drape, hand hygiene and skin antiseptic. A timeout was performed prior to the initiation of the procedure. The patient was made to lie prone on the angiography table. The lower lumbar spine was prepped and draped in a sterile fashion. Under fluoroscopy, the L4-5 disc space was delineated and the skin area was marked. The skin was infiltrated with a 1% Lidocaine approximately 5 cm lateral to the spinous process projection on the right. Then, an 18 gauge by 15 cm spinal needle was advanced under fluoroscopy into the L4-5 disc space. A 22 gauge x 20 cm spinal needle was then advanced through the 18 gauge needle into the L4-5 disc. The disc was aspirated and serosanguineous fluid was obtained and sent into 2 capped syringes to the laboratory for microbiology analysis. The needle was subsequently withdrawn. The access site was cleaned and covered with a sterile bandage. IMPRESSION: Successful and uncomplicated fluoroscopy guided fine needle aspiration of the L4-5 disc. Sample sent for culture and staining. Electronically Signed   By: Pedro Earls M.D.   On: 04/07/2020 14:24     Cardiac Studies   TEE 03/26/20: 1. Left ventricular ejection fraction, by estimation, is 60 to 65%. The  left ventricle has normal function. The left ventricle has no regional  wall motion abnormalities.  2. Right ventricular systolic function is normal. The right ventricular  size is normal. There is normal pulmonary artery systolic pressure.  3. No left atrial/left atrial appendage thrombus was detected.  4. The mitral valve is myxomatous. Moderate mitral valve regurgitation.  No evidence of mitral stenosis.  5. The aortic valve has been repaired/replaced. Aortic valve  regurgitation is not visualized. No aortic stenosis is present. There is a  23 mm Edwards bioprosthetic valve present in the aortic  position.  Procedure Date: 08/16/2016. Echo findings are  consistent with normal structure and function of the aortic valve  prosthesis.  6. The inferior vena cava is normal in size with greater than 50%  respiratory variability, suggesting right atrial pressure of 3 mmHg.    Echo 03/22/20: 1. There is no obvious vegetation, however transaortic gradients across  the bioprosthetic valve are increased from the prior study on 01/16/2020,  mean gradient 28 mmHg, previously 16 mmHg. Consider TEE for further  evaluation.  2. Left ventricular ejection fraction, by estimation, is 55 to 60%. The  left ventricle has normal function. The left ventricle has no regional  wall motion abnormalities. There is mild concentric left ventricular  hypertrophy. Left ventricular diastolic  parameters are consistent with Grade I diastolic dysfunction (impaired  relaxation). Elevated left atrial pressure.  3. Right ventricular systolic function is normal. The right ventricular  size is normal.  4. Left atrial size was mildly dilated.  5. The mitral valve is normal in structure. Mild mitral valve  regurgitation. No evidence of mitral stenosis.  6. The aortic valve has been repaired/replaced. Aortic  valve  regurgitation is not visualized. No aortic stenosis is present. There is a  23 mm Edwards-SAPIEN valve present in the aortic position. Aortic valve  mean gradient measures 28.0 mmHg.  7. The inferior vena cava is normal in size with greater than 50%  respiratory variability, suggesting right atrial pressure of 3 mmHg.   Patient Profile     69 y.o. male   Assessment & Plan    Atrial flutter with RVR Hx of PAF Chronic anticoagulation - held for joint aspiration - pt continues with rates in the 130-140s, BP OK - home amiodarone was on hold for transaminitis felt to be due to sepsis --> LFTs improving and amiodarone has been restarted at 400 mg BID - AST normal, ALT 76 --> once ALT has normalized, may consider IV amio load - home BB was PRN - TSH WNL - will likely be difficult to rate control with ongoing infection - anemia is not helping the rate - Hb 7.7 - continue eliquis if no bleeding appreciated - pt is amenable to DCCV if needed - will schedule on Monday with TEE since eliquis was held for procedure   CAD s/p CABG - cath 01/2020 with 2/3 grafts occluded with patent LIMA-ALD - no ASA in the setting of eliquis - zetia on hold for LFTs   Aortic stenosis s/p AVR 2017  - at the time of CABG - recent TEE for bacteremia with normal structure and function of prosthetic valve   Hypertension - home amlodipine on hold for marginal pressures      For questions or updates, please contact [facility] Please consult [email or web address] for contact info under        Signed, Ledora Bottcher, PA  04/09/2020, 8:56 AM    Personally seen and examined. Agree with above.  Laying in bed trying to get comfortable.  No chest pain.  Still feeling palpitations.  GEN: Well nourished, well developed, in no acute distress  HEENT: normal  Neck: no JVD, carotid bruits, or masses Cardiac: tachy reg; no murmurs, rubs, or gallops,no edema  Respiratory:  clear to auscultation  bilaterally, normal work of breathing GI: soft, nontender, nondistended, + BS MS: no deformity or atrophy  Skin: warm and dry, no rash Neuro:  Alert and Oriented x 3, Strength and sensation are intact Psych: euthymic mood, full affect  69 year old male  with atrial flutter rapid ventricular response post CABG post AVR in 2017 currently in rehab for discitis.  Atrial fibrillation/atrial flutter -Currently in atrial flutter.  Just started amiodarone load 400 mg twice a day on 04/08/2020.  We are not using IV amiodarone because he would need to be transferred out of rehab and he is getting the care that he needs currently in rehabilitation. -What we will do is plan on performing a TEE cardioversion on Monday.   Aortic valve replacement is stable.  CAD is stable.  Discitis-IV antibiotics.  Candee Furbish, MD

## 2020-04-09 NOTE — Progress Notes (Signed)
Patient asymptomatic during night. Can feel heart racing at times. Wife at bedside. Johnathan Murray A

## 2020-04-09 NOTE — Progress Notes (Signed)
Physical Therapy Session Note  Patient Details  Name: Johnathan Murray MRN: [id number] Date of Birth: 03/21/1951  Today's Date: 04/09/2020 PT Individual Time: [id number] PT Individual Time Calculation (min): 40 min   Short Term Goals: Week 1:  PT Short Term Goal 1 (Week 1): Pt will perform supine<>sit with minA. PT Short Term Goal 2 (Week 1): Pt will perform sit to stand with minA. PT Short Term Goal 3 (Week 1): Pt will perform bed>chair transfer with minA. PT Short Term Goal 4 (Week 1): Pt will ambulate 72' with minA.  Skilled Therapeutic Interventions/Progress Updates:    Pt received asleep, supine in bed with his wife present. Pt's wife reports that he has finally been able to get in a comfortable position to sleep but had stated he would like to perform LE stretches. Upon awakening pt agreeable to therapy session. Pt continues to have "bogginess" or fluid collection near quadriceps tendon on pt's L knee. Demonstrates lacking full knee extension in B LEs. Performed the following stretches and LE exercises bilaterally in supine within pt's pain tolerance:  - heel cord stretching 2x 53minute  - heel slides active assisted as needed for pain management, cuing to stay in pain free range (has R hip pain with increased flexion) x10 reps - hip abduction/adduction x10 reps - hamstring straight leg raise stretch 2x1 minute Cardiology MD in/out to discuss pt's current cardiac state with elevated HR - she recommended monitoring pt's HR with activity and to avoid too high of an increase as this can put pt at increased risk of syncopal episode - but does recommend continued PT and mobility. Supine>L sidelying via logroll with min assist and max multimodal cuing for sequencing to maintain spine precautions. Pt left sidelying with needs in reach, lines intact, bed alarm on, and pt's wife present.    Therapy Documentation Precautions:  Precautions Precautions: Fall, Cervical, Other  (comment)(Spinal) Precaution Comments: ACDF on 4/12, pt reports they d/c-ed collar 1 week after surgery Required Braces or Orthoses: Spinal Brace Spinal Brace: Lumbar corset Other Brace: for comfort Restrictions Weight Bearing Restrictions: No  Pain:   Reports low back, R hip, and B knee pain - medication administered and pt modified interventions or provided rest breaks/repositioning for pain management.    Therapy/Group: Individual Therapy  Tawana Scale, PT, DPT 04/09/2020, 7:56 AM

## 2020-04-09 NOTE — Plan of Care (Signed)
  Problem: SCI BOWEL ELIMINATION Goal: RH STG MANAGE BOWEL WITH ASSISTANCE Description: STG Manage Bowel with Assistance. Flowsheets (Taken 04/09/2020 1020) STG: Pt will manage bowels with assistance: 3-Moderate assistance Goal: RH STG SCI MANAGE BOWEL WITH MEDICATION WITH ASSISTANCE Description: STG SCI Manage bowel with medication with assistance. Note: Patient will verbalize ways to manage bowel to promote regular emptying. Goal: RH STG SCI MANAGE BOWEL PROGRAM W/ASSIST OR AS APPROPRIATE Description: STG SCI Manage bowel program w/assist or as appropriate. Flowsheets (Taken 04/09/2020 1020) STG: SCI Pt will manage bowels with assistance or as appropriate: Moderate assist   Problem: SCI BLADDER ELIMINATION Goal: RH STG MANAGE BLADDER WITH ASSISTANCE Description: STG Manage Bladder With Assistance Flowsheets (Taken 04/09/2020 1020) STG: Pt will manage bladder with assistance: 3-Moderate assistance Goal: RH STG MANAGE BLADDER WITH MEDICATION WITH ASSISTANCE Description: STG Manage Bladder With Medication With Assistance. Flowsheets (Taken 04/09/2020 1020) STG: Pt will manage bladder with medication with assistance: 3-Moderate assistance   Problem: RH SKIN INTEGRITY Goal: RH STG SKIN FREE OF INFECTION/BREAKDOWN Note: Patient will verbalize ways to prevent skin breakdown in the hospital.  Goal: RH STG MAINTAIN SKIN INTEGRITY WITH ASSISTANCE Description: STG Maintain Skin Integrity With Assistance. Flowsheets (Taken 04/09/2020 1020) STG: Maintain skin integrity with assistance: 3-Moderate assistance Goal: RH STG ABLE TO PERFORM INCISION/WOUND CARE W/ASSISTANCE Description: STG Able To Perform Incision/Wound Care With Assistance. Flowsheets (Taken 04/09/2020 1020) STG: Pt will be able to perform incision/wound care with assistance: 3-Moderate assistance   Problem: RH SAFETY Goal: RH STG ADHERE TO SAFETY PRECAUTIONS W/ASSISTANCE/DEVICE Description: STG Adhere to Safety Precautions With  Assistance/Device. Flowsheets (Taken 04/09/2020 1020) STG:Pt will adhere to safety precautions with assistance/device: 3-Moderate assistance Goal: RH STG DECREASED RISK OF FALL WITH ASSISTANCE Description: STG Decreased Risk of Fall With Assistance. Flowsheets (Taken 04/09/2020 1020) [id number] risk of fall  with assistance/device: 3-Moderate assistance   Problem: RH KNOWLEDGE DEFICIT SCI Goal: RH STG INCREASE KNOWLEDGE OF SELF CARE AFTER SCI Note: Patient will verbalize ways to promote healthy lifestyle and wellness after discharge.

## 2020-04-09 NOTE — Progress Notes (Signed)
[facility] for Infectious Disease    Date of Admission:  04/07/2020      ID: Johnathan Murray is a 69 y.o. male with  Disseminated enterococcal disease Principal Problem:   Discitis    Subjective: He is not having significant relief from left knee aspirate yesterday.(had cells of 26K, with left shift, WBC, calcium deposit, cultures pending). Still feels his heart racing., feels fatigued  Medications:  . amiodarone  400 mg Oral BID  . apixaban  5 mg Oral BID  . vitamin C  500 mg Oral Daily  . Chlorhexidine Gluconate Cloth  6 each Topical BID  . docusate sodium  100 mg Oral BID  . feeding supplement (PRO-STAT SUGAR FREE 64)  30 mL Oral TID  . insulin aspart  0-15 Units Subcutaneous TID WC  . insulin aspart  3 Units Subcutaneous TID WC  . insulin glargine  10 Units Subcutaneous BID  . iron polysaccharides  150 mg Oral Q breakfast  . levothyroxine  25 mcg Oral Q0600  . methylPREDNISolone acetate  80 mg Intra-articular Once  . oxyCODONE  10 mg Oral Q12H  . pregabalin  100 mg Oral BID  . Ensure Max Protein  11 oz Oral BID  . senna  1 tablet Oral BID  . vitamin B-12  1,000 mcg Oral Daily    Objective: Vital signs in last 24 hours: Temp:  [97.6 F (36.4 C)-99.5 F (37.5 C)] 99.4 F (37.4 C) (05/20 1728) Pulse Rate:  [133-141] 139 (05/20 1728) Resp:  [14-19] 17 (05/20 1728) BP: (95-111)/(64-77) 102/73 (05/20 1728) SpO2:  [97 %-100 %] 97 % (05/20 1728) Physical Exam  Constitutional: He is oriented to person, place, and time. He appears well-developed and well-nourished. No distress.  HENT:  Mouth/Throat: Oropharynx is clear and moist. No oropharyngeal exudate.  Cardiovascular: Normal rate, regular rhythm and normal heart sounds. Exam reveals no gallop and no friction rub.  No murmur heard.  Pulmonary/Chest: Effort normal and breath sounds normal. No respiratory distress. He has no wheezes.  Abdominal: Soft. Bowel sounds are normal. He exhibits no distension. There  is no tenderness.  Ext: fluctuance superior to left knee joint no erythema. Neurological: He is alert and oriented to person, place, and time.  Skin: Skin is warm and dry. No rash noted. No erythema.  Psychiatric: He has a normal mood and affect. His behavior is normal.    Lab Results Recent Labs    04/07/20 0432 04/07/20 0432 04/08/20 1131 04/09/20 0411  WBC 7.7  --  10.5  --   HGB 7.6*  --  7.7*  --   HCT 23.9*  --  24.6*  --   NA 136   < > 135 135  K 4.2   < > 4.0 3.8  CL 101   < > 100 100  CO2 26   < > 25 27  BUN 21   < > 22 19  CREATININE 1.32*   < > 1.60* 1.40*   < > = values in this interval not displayed.   Liver Panel Recent Labs    04/08/20 1131 04/09/20 0411  PROT 6.2* 6.1*  ALBUMIN 1.9* 1.9*  AST 60* 38  ALT 94* 76*  ALKPHOS 106 93  BILITOT 1.2 0.9    Microbiology: Disc aspirate ngtd Left knee arthrocentesis ngtd Studies/Results: No results found.   Assessment/Plan: Disseminated enterococcal disease = continue on ampicillin plus ceftriaxone  afib with rvr = if he is to get cardioverted,  recommend to see if see if any new vegetations. For now he is not yet in sinus rhythm  Gouty flare +/- septic arthritis = awaiting to see if cultures are still positive to warrant repeat washout. Agree with  dr Luanna Cole plan.  [facility] Cell: [phone] Pager: [phone]  04/09/2020, 5:30 PM

## 2020-04-09 NOTE — Progress Notes (Addendum)
[place] Individual Statement of Services  Patient Name:  Johnathan Murray  Date:  04/09/2020  Welcome to the [facility].  Our goal is to provide you with an individualized program based on your diagnosis and situation, designed to meet your specific needs.  With this comprehensive rehabilitation program, you will be expected to participate in at least 3 hours of rehabilitation therapies Monday-Friday, with modified therapy programming on the weekends.  Your rehabilitation program will include the following services:  Physical Therapy (PT), Occupational Therapy (OT), 24 hour per day rehabilitation nursing, Therapeutic Recreaction (TR), Psychology, Neuropsychology, Care Coordinator, Rehabilitation Medicine, Nutrition Services, Pharmacy Services and Other  Weekly team conferences will be held on Tuesdays to discuss your progress.  Your Inpatient Rehabilitation Care Coordinator will talk with you frequently to get your input and to update you on team discussions.  Team conferences with you and your family in attendance may also be held.  Expected length of stay: 2-2.5 weeks   Overall anticipated outcome: Supervision  Depending on your progress and recovery, your program may change. Your Inpatient Rehabilitation Care Coordinator will coordinate services and will keep you informed of any changes. Your Inpatient Rehabilitation Care Coordinator's name and contact numbers are listed  below.  The following services may also be recommended but are not provided by the [facility] will be made to provide these services after discharge if needed.  Arrangements include referral to agencies that provide these services.  Your insurance has been verified to be:  Medicare Part A and [employer]  Your primary  doctor is:  Nelda Bucks  Pertinent information will be shared with your doctor and your insurance company.  Inpatient Rehabilitation Care Coordinator:  Cathleen Corti [phone] or ([phone]  Information discussed with and copy given to patient by: Rana Snare, 04/09/2020, 9:45 AM

## 2020-04-09 NOTE — Progress Notes (Signed)
Subjective: Patient is alert, oriented, laying comfortably in bed. Reports pain in knee to be mild today. States he stood for 1 minute on his own with PT today and had mild pain in left knee, none in right. He is happy with progress he is making with PT, but continues to have constant low back pain.   Objective:  PE: VITALS:   Vitals:   04/09/20 0741 04/09/20 1153 04/09/20 1320 04/09/20 1728  BP: 103/77 100/72 107/77 102/73  Pulse: (!) 141 (!) 139 (!) 140 (!) 139  Resp: 17 18 14 17   Temp: 98.8 F (37.1 C) 97.6 F (36.4 C) 98.3 F (36.8 C) 99.4 F (37.4 C)  TempSrc: Oral Oral Oral   SpO2: 100% 99% 100% 97%  Weight:      Height:       General: Alert, oriented, in no acute distress MSK: 0-45 degrees of flexion of bilateral knees while supine in bed. Moderate swelling at distal femur bilaterally - left worse than right, however this is significantly improved from original swelling seen at time of first aspirations. EHL and FHL intact bilaterally. 2+ DP pulses. Distal sensation intact bilaterally. Arthroscopic surgical sites healing, no signs of infection, no drainage.   LABS  Results for orders placed or performed during the hospital encounter of 04/07/20 (from the past 24 hour(s))  Glucose, capillary     Status: Abnormal   Collection Time: 04/08/20  9:29 PM  Result Value Ref Range   Glucose-Capillary 175 (H) 70 - 99 mg/dL  TSH     Status: None   Collection Time: 04/08/20  9:32 PM  Result Value Ref Range   TSH 4.097 0.350 - 4.500 uIU/mL  Magnesium     Status: None   Collection Time: 04/08/20  9:32 PM  Result Value Ref Range   Magnesium 2.0 1.7 - 2.4 mg/dL  Comprehensive metabolic panel     Status: Abnormal   Collection Time: 04/09/20  4:11 AM  Result Value Ref Range   Sodium 135 135 - 145 mmol/L   Potassium 3.8 3.5 - 5.1 mmol/L   Chloride 100 98 - 111 mmol/L   CO2 27 22 - 32 mmol/L   Glucose, Bld 163 (H) 70 - 99 mg/dL   BUN 19 8 - 23 mg/dL   Creatinine, Ser 1.40  (H) 0.61 - 1.24 mg/dL   Calcium 8.7 (L) 8.9 - 10.3 mg/dL   Total Protein 6.1 (L) 6.5 - 8.1 g/dL   Albumin 1.9 (L) 3.5 - 5.0 g/dL   AST 38 15 - 41 U/L   ALT 76 (H) 0 - 44 U/L   Alkaline Phosphatase 93 38 - 126 U/L   Total Bilirubin 0.9 0.3 - 1.2 mg/dL   GFR calc non Af Amer 52 (L) >60 mL/min   GFR calc Af Amer 60 (L) >60 mL/min   Anion gap 8 5 - 15  Glucose, capillary     Status: Abnormal   Collection Time: 04/09/20  5:57 AM  Result Value Ref Range   Glucose-Capillary 160 (H) 70 - 99 mg/dL  Glucose, capillary     Status: Abnormal   Collection Time: 04/09/20 12:00 PM  Result Value Ref Range   Glucose-Capillary 203 (H) 70 - 99 mg/dL  Glucose, capillary     Status: Abnormal   Collection Time: 04/09/20  4:35 PM  Result Value Ref Range   Glucose-Capillary 67 (L) 70 - 99 mg/dL  Glucose, capillary     Status: Abnormal  Collection Time: 04/09/20  4:38 PM  Result Value Ref Range   Glucose-Capillary 69 (L) 70 - 99 mg/dL  Glucose, capillary     Status: Abnormal   Collection Time: 04/09/20  5:44 PM  Result Value Ref Range   Glucose-Capillary 112 (H) 70 - 99 mg/dL    No results found.  Assessment/Plan:  Bilateral knee pain, septic arthritis s/p bilateral knee irrigation and debridement - 5/1 - left knee aspiration on performed which showedintracellular calcium pyrophosphate crystals with white blood cell count approx. 15,000 -consistent with pseudogout - 5/2 - right knee aspirated due to report of right knee pain and swelling, and bilateral knees received cortisone injection to results above - this aspiration showed intracellular calcium pyrophosphate crystals and white blood cell count approx. 19,000 - 5/3 - left knee culture grewout Enterococcus faecalis, no growth from right knee aspirate - 5/2 - bilateral knee irrigation and debridement - yesterday orthopedics contacted due to left knee swelling, knee aspirated again and only 4 ml was drawn - results of this aspiration are so far  no organisms, white blood cell count approx. 26,000 and intra and extra cellular calcium pyrophosphate crystals - continue to WBAT bilateral knees, ROM improving with PT -arthroscopic incisions appear to be healing well - Dr. Mardelle Matte spoke with patient yesterday and both are agreeable to plan to continue conservative course, we will continue to follow new cultures with no surgical intervention at this time, if we do see new bacterial growth we will consider a repeat washout  Ventura Bruns 04/09/2020, 6:03 PM

## 2020-04-10 ENCOUNTER — Inpatient Hospital Stay (HOSPITAL_COMMUNITY): Payer: BC Managed Care – PPO

## 2020-04-10 ENCOUNTER — Inpatient Hospital Stay (HOSPITAL_COMMUNITY): Payer: BC Managed Care – PPO | Admitting: Physical Therapy

## 2020-04-10 ENCOUNTER — Inpatient Hospital Stay (HOSPITAL_COMMUNITY): Payer: BC Managed Care – PPO | Admitting: Occupational Therapy

## 2020-04-10 DIAGNOSIS — I480 Paroxysmal atrial fibrillation: Secondary | ICD-10-CM

## 2020-04-10 DIAGNOSIS — M4640 Discitis, unspecified, site unspecified: Secondary | ICD-10-CM

## 2020-04-10 LAB — GLUCOSE, CAPILLARY
Glucose-Capillary: 135 mg/dL — ABNORMAL HIGH (ref 70–99)
Glucose-Capillary: 115 mg/dL — ABNORMAL HIGH (ref 70–99)
Glucose-Capillary: 152 mg/dL — ABNORMAL HIGH (ref 70–99)
Glucose-Capillary: 85 mg/dL (ref 70–99)

## 2020-04-10 NOTE — Progress Notes (Signed)
[facility] PHYSICAL MEDICINE & REHABILITATION PROGRESS NOTE   Subjective/Complaints:   Pt reports having difficulties with bed- calling nurse on its own.  HR still elevated. - the same or slightly less per pt.   Wants to know about metoprolol- L knee still swollen- but not painful right now.  Stood by self x 1 minute- and pain much better- not a lot of "heavy pain" in last 24 hours.  Asking about back Cx- is still pending/negative so far.   ROS:  Pt denies SOB, abd pain, CP, N/V/C/D, and vision changes   Objective:   No results found. Recent Labs    04/08/20 1131  WBC 10.5  HGB 7.7*  HCT 24.6*  PLT 330   Recent Labs    04/08/20 1131 04/09/20 0411  NA 135 135  K 4.0 3.8  CL 100 100  CO2 25 27  GLUCOSE 226* 163*  BUN 22 19  CREATININE 1.60* 1.40*  CALCIUM 9.0 8.7*    Intake/Output Summary (Last 24 hours) at 04/10/2020 0849 Last data filed at 04/10/2020 0546 Gross per 24 hour  Intake 1062.69 ml  Output 1350 ml  Net -287.31 ml     Physical Exam: Vital Signs Blood pressure 104/76, pulse (!) 138, temperature 98.8 F (37.1 C), resp. rate 14, height 5\' 10"  (1.778 m), weight 79.4 kg, SpO2 100 %.  Physical Exam Constitutional: awake, alert, appropriate, laying supine in bed; wife at bedside, NAD HENT:  Conjugate gaze Cardiovascular:tachycardic- rate 130s- in afib Respiratory:CTA B/L- good air movement [id number], NT, ND, (+)BS  Musculoskeletal:  Cervical back: Normal range of motion.  Comments: Both knees swollen and tender- L knee still swollen, however less TTP-  specifically suprapatella bursitis noted . Low back TTP and he complains of "stabbing pain". . Neurological: He is alertand oriented to person, place, and time. Nocranial nerve deficit. UE motor 5/5. LE 3-/5 prox (pain inhibition from back/knees) to 4/5 with ADF/PF. No focal sensory loss. DTR's 1+ Skin: Skin iswarm. He isnot diaphoretic.  Psychiatric:  appropriate    Assessment/Plan: 1. Functional deficits secondary to Discitis/Osteomyelitis  which require 3+ hours per day of interdisciplinary therapy in a comprehensive inpatient rehab setting.  Physiatrist is providing close team supervision and 24 hour management of active medical problems listed below.  Physiatrist and rehab team continue to assess barriers to discharge/monitor patient progress toward functional and medical goals  Care Tool:  Bathing    Body parts bathed by patient: Right arm, Left arm, Chest, Abdomen, Front perineal area, Right upper leg, Left upper leg, Face   Body parts bathed by helper: Right lower leg, Buttocks, Left lower leg     Bathing assist Assist Level: Moderate Assistance - Patient 50 - 74%     Upper Body Dressing/Undressing Upper body dressing Upper body dressing/undressing activity did not occur (including orthotics): N/A What is the patient wearing?: Pull over shirt    Upper body assist Assist Level: Minimal Assistance - Patient > 75%    Lower Body Dressing/Undressing Lower body dressing    Lower body dressing activity did not occur: N/A What is the patient wearing?: Pants     Lower body assist Assist for lower body dressing: Total Assistance - Patient < 25%     Toileting Toileting    Toileting assist Assist for toileting: Maximal Assistance - Patient 25 - 49%     Transfers Chair/bed transfer  Transfers assist  Chair/bed transfer activity did not occur: N/A  Chair/bed transfer assist level: Moderate Assistance -  Patient 50 - 74%     Locomotion Ambulation   Ambulation assist      Assist level: Moderate Assistance - Patient 50 - 74% Assistive device: Walker-rolling Max distance: 5'   Walk 10 feet activity   Assist  Walk 10 feet activity did not occur: Safety/medical concerns        Walk 50 feet activity   Assist Walk 50 feet with 2 turns activity did not occur: Safety/medical concerns         Walk  150 feet activity   Assist Walk 150 feet activity did not occur: Safety/medical concerns         Walk 10 feet on uneven surface  activity   Assist Walk 10 feet on uneven surfaces activity did not occur: Safety/medical concerns         Wheelchair     Assist Will patient use wheelchair at discharge?: Yes(pending progress and pain management) Type of Wheelchair: Manual    Wheelchair assist level: Supervision/Verbal cueing Max wheelchair distance: 150'    Wheelchair 50 feet with 2 turns activity    Assist        Assist Level: Supervision/Verbal cueing   Wheelchair 150 feet activity     Assist      Assist Level: Supervision/Verbal cueing   Blood pressure 104/76, pulse (!) 138, temperature 98.8 F (37.1 C), resp. rate 14, height 5\' 10"  (1.778 m), weight 79.4 kg, SpO2 100 %.  Medical Problem List and Plan: 1.Decreased functional mobilitysecondary to sepsis/Enterococcusfaecalisbacteremia with septic knees and lumbar discitis/osteomyelitis -patient may shower -ELOS/Goals: 12-14 days, supervision to mod I 2. Antithrombotics: -DVT/anticoagulation:Eliquis 5mg  BID -antiplatelet therapy: N/A 3. Pain Management:Flexeril as needed, oxycodone as needed -heat, kpad as appropriate, lumbar corset, ice for knees. -ROM, core strengthening with therapies  5/20- added Oxycontin 10 mg BID, increased Lyrica to 100 mg BID and changed Flexeril to 5 mg TID prn for muscle spasms, since so sedating.   5/21- pain better controlled 4. Mood:Trazodone as needed -antipsychotic agents: N/C 5. Neuropsych: This patientiscapable of making decisions on hisown behalf. 6. Skin/Wound Care:Routine skin checks 7. Fluids/Electrolytes/Nutrition:Routine in and outs with follow-up chemistries 8. Acute osteomyelitis/discitis L4-5. Neurosurgery follow-up no surgical intervention.Lumbar corsetfor comfort.   -S/p FNA biopsy L4-5 disc today.Await aspiration results  5/20- ID put pt also on Rocephin as well   9.Recent C3-4 ACDF 03/02/2020 per [name] for cervical myelopathy. Cervical collar has been discontinued. 10. Septic knee. Status post aspiration and steroid injection with I&D of bilateral knees 03/24/2020. Follow-up orthopedic services  5/20- they attempted another aspiration- not real effective per pt- still has suprapatellar bursitis type swelling 11. ID/Enterococcus bacteremia. Continue intravenous ampicillin through 05/20/2020. Follow-up infectious disease  5/20- on Ampicillin AND now Rocephin til 05/20/20  5/21- back Cx begative/not final 12. PAF/bioprosthetic aortic valve replacement 2017. Await plan to resume Eliquis. Amiodarone currently on hold  5/19- Back in Afib with RVR- called Cards to see if can restart Amiodarone.   5/20- restarted Amio at 400 mg BID- rate still 130s-140 13. AKI. Follow-up chemistries 14. Diabetes mellitus. Hemoglobin A1c 6.8. Currently on NovoLog 3 units 3 times daily as well as Lantus insulin 10 units twice daily. Patient on Glucophage prior to admission 500 mg nightly. Currently on hold due to AKI. Titrate regime to effect CBG (last 3)  Recent Labs    04/09/20 1744 04/09/20 2052 04/10/20 0619  GLUCAP 112* 128* 135*    5/21- BGs welll controlled- con't meds 15. Acute on chronic anemia. Continue  Niferex. Follow-up CBC 16. Hypothyroidism. Synthroid 17. Nerve pain in RLE- will add Lyrica 50 mg BID and monitor   5/20- increased to 100 mg BID  5/21- pain better controlled   LOS: 3 days A FACE TO FACE EVALUATION WAS PERFORMED  Nyima Vanacker 04/10/2020, 8:49 AM

## 2020-04-10 NOTE — Progress Notes (Signed)
Physical Therapy Session Note  Patient Details  Name: Johnathan Murray MRN: [id number] Date of Birth: 07/10/1951  Today's Date: 04/10/2020     Short Term Goals: Week 1:  PT Short Term Goal 1 (Week 1): Pt will perform supine<>sit with minA. PT Short Term Goal 2 (Week 1): Pt will perform sit to stand with minA. PT Short Term Goal 3 (Week 1): Pt will perform bed>chair transfer with minA. PT Short Term Goal 4 (Week 1): Pt will ambulate 80' with minA.  Skilled Therapeutic Interventions/Progress Updates:   Pt received supine in bed and reports that he is too exhausted to perform therapy at this time. HR between 39-141 this AM. Pt allowed to remain in bed with all needs in reach. Will re-attempt, provided pt is medically stable and time permits.      Therapy Documentation Precautions:  Precautions Precautions: Fall, Cervical, Other (comment) Precaution Comments: ACDF on 4/12, pt reports they d/c-ed collar 1 week after surgery Required Braces or Orthoses: Spinal Brace Spinal Brace: Lumbar corset Other Brace: for comfort Restrictions Weight Bearing Restrictions: No General: PT Amount of Missed Time (min): 60 Minutes PT Missed Treatment Reason: Patient ill (Comment) Vital Signs: Therapy Vitals Temp: 100.3 F (37.9 C) Pulse Rate: (!) 102 Resp: 17 BP: 106/71 Patient Position (if appropriate): Lying Oxygen Therapy SpO2: 99 % O2 Device: Room Air    Therapy/Group: Individual Therapy  Lorie Phenix 04/10/2020, 6:34 PM

## 2020-04-10 NOTE — Progress Notes (Signed)
No growth as of today from new left knee aspirate. Will continue conservative treatment and will continue to follow aspiration cultures.   Merlene Pulling, PA-C

## 2020-04-10 NOTE — Progress Notes (Addendum)
    [facility] has been requested to perform a transesophageal echocardiogram on Johnathan Murray for atrial flutter RVR.  After careful review of history and examination, the risks and benefits of transesophageal echocardiogram have been explained including risks of esophageal damage, perforation (1:10,000 risk), bleeding, pharyngeal hematoma as well as other potential complications associated with conscious sedation including aspiration, arrhythmia, respiratory failure and death. Alternatives to treatment were discussed, questions were answered. Patient is willing to proceed.   Will be good to look at valves again given his new infection.  Pt is scheduled for TEE-guided cardioversion on Monday 04/13/20 with Dr. Audie Box at 10:30AM.   Ledora Bottcher, [place]  04/10/2020 9:31 AM

## 2020-04-10 NOTE — IPOC Note (Signed)
Overall Plan of Care [facility] Patient Details Name: Johnathan Murray MRN: [id number] DOB: 04-27-1951  Admitting Diagnosis: Discitis  Hospital Problems: Principal Problem:   Discitis     Functional Problem List: Nursing Behavior, Endurance, Medication Management, Motor, Pain, Safety, Sensory, Skin Integrity  PT Balance, Endurance, Motor, Pain, Safety  OT Balance, Endurance, Motor, Pain, Safety, Sensory  SLP    TR         Basic ADL's: OT Bathing, Dressing, Toileting     Advanced  ADL's: OT       Transfers: PT Bed Mobility, Bed to Chair, Teacher, early years/pre, Tub/Shower     Locomotion: PT Ambulation, Emergency planning/management officer, Stairs     Additional Impairments: OT    SLP        TR      Anticipated Outcomes Item Anticipated Outcome  Self Feeding    Swallowing      Basic self-care  Supervision A  Toileting  Supervision A   Bathroom Transfers Supervision A  Bowel/Bladder  Currently continent, will maintain continence, manage with min assisstance  Transfers  Supervision  Locomotion  Supervision  Communication     Cognition     Pain  Pt c/o BLE pain, pain level with be less than or equal to 3, manage w/ min assisstance  Safety/Judgment  Pt to be compliant with floor safety plan, to call if need assistance, no falls during stay on floor   Therapy Plan: PT Intensity: Minimum of 1-2 x/day ,45 to 90 minutes PT Frequency: 5 out of 7 days PT Duration Estimated Length of Stay: 2-2.5 weeks OT Intensity: Minimum of 1-2 x/day, 45 to 90 minutes OT Frequency: 5 out of 7 days OT Duration/Estimated Length of Stay: 2-2.5 wks     Due to the current state of emergency, patients may not be receiving their 3-hours of Medicare-mandated therapy.   Team Interventions: Nursing Interventions Patient/Family Education, Disease Management/Prevention, Pain Management, Medication Management, Skin Care/Wound Management, Discharge Planning, Psychosocial Support  PT interventions  Ambulation/gait training, Cognitive remediation/compensation, Discharge planning, DME/adaptive equipment instruction, Functional mobility training, Pain management, Psychosocial support, Splinting/orthotics, Therapeutic Activities, UE/LE Strength taining/ROM, Visual/perceptual remediation/compensation, Training and development officer, Disease management/prevention, Community reintegration, Technical sales engineer stimulation, Neuromuscular re-education, Patient/family education, IT trainer, Therapeutic Exercise, UE/LE Coordination activities, Wheelchair propulsion/positioning  OT Interventions Community reintegration, Discharge planning, DME/adaptive equipment instruction, Functional mobility training, Pain management, Patient/family education, Self Care/advanced ADL retraining, Psychosocial support, Therapeutic Activities, Therapeutic Exercise, UE/LE Strength taining/ROM, UE/LE Coordination activities  SLP Interventions    TR Interventions    SW/CM Interventions Psychosocial Support, Patient/Family Education, Discharge Planning   Barriers to Discharge MD  Medical stability, Home enviroment access/loayout, IV antibiotics and osteomyelitis pain; Afib with RVR  Nursing Inaccessible home environment, Decreased caregiver support, Medical stability, Home environment access/layout, IV antibiotics, Behavior    PT Home environment access/layout    OT Home environment access/layout    SLP      SW Decreased caregiver support, Lack of/limited family support Wife will be primary caregiver with intermittent support from dtr when she is available.   Team Discharge Planning: Destination: PT-Home ,OT- Home , SLP-  Projected Follow-up: PT-Home health PT, OT-  Home health OT, 24 hour supervision/assistance, SLP-  Projected Equipment Needs: PT-To be determined, OT- To be determined, SLP-  Equipment Details: PT- , OT-  Patient/family involved in discharge planning: PT- Patient, Family member/caregiver,   OT-Patient, Family member/caregiver, SLP-   MD ELOS: 2-2.5 weeks Medical Rehab Prognosis:  Good Assessment: Pt with osteomyelitis/disciitis with  back and leg pain, B/L septic knees, Afib with RVR in 130s-140s currently,  DM- well controlled- and AKI- improved.    Goals- supervision   See Team Conference Notes for weekly updates to the plan of care

## 2020-04-10 NOTE — Progress Notes (Signed)
RR WNL. MEWS for RR is 0. 

## 2020-04-10 NOTE — Progress Notes (Signed)
Physical Therapy Session Note  Patient Details  Name: Yunior Boxx MRN: [id number] Date of Birth: 1951-11-02  Today's Date: 04/10/2020 PT Missed Time: 75 Minutes Missed Time Reason: Other (Comment)(Pt verbalizes exhaustion. HR 39 and with a 99.4 temperature.)  Short Term Goals: Week 1:  PT Short Term Goal 1 (Week 1): Pt will perform supine<>sit with minA. PT Short Term Goal 2 (Week 1): Pt will perform sit to stand with minA. PT Short Term Goal 3 (Week 1): Pt will perform bed>chair transfer with minA. PT Short Term Goal 4 (Week 1): Pt will ambulate 27' with minA.  Pt supine in bed on PT arrival. Verbalizes feeling exhausted. Vitals taken and HR 39 and temperature 99.4. Activity deferred due to symptomatic bradycardia and fever. RN informed. PT to follow up as able.  Therapy Documentation Precautions:  Precautions Precautions: Fall, Cervical, Other (comment) Precaution Comments: ACDF on 4/12, pt reports they d/c-ed collar 1 week after surgery Required Braces or Orthoses: Spinal Brace Spinal Brace: Lumbar corset Other Brace: for comfort Restrictions Weight Bearing Restrictions: No    Therapy/Group: Individual Therapy  Breck Coons, PT, DPT 04/10/2020, 4:21 PM

## 2020-04-10 NOTE — Progress Notes (Signed)
Patient's MEWS score went from yellow to red. No changes in condition. Charge notified, PA notified as well. Amanda Cockayne, LPN

## 2020-04-10 NOTE — Progress Notes (Signed)
Occupational Therapy Session Note  Patient Details  Name: Johnathan Murray MRN: [id number] Date of Birth: 10/09/1951  Today's Date: 04/10/2020 OT Individual Time: [id number] OT Individual Time Calculation (min): 56 min    Short Term Goals: Week 1:  OT Short Term Goal 1 (Week 1): Patient will thread BLE through LB clothing with Min A and AE PRN. OT Short Term Goal 2 (Week 1): Patient will complete toilet transfer to [facility] with LRAD and Min A. OT Short Term Goal 3 (Week 1): Patient will complete 3/3 parts of toileting task in sitting/standing.  Skilled Therapeutic Interventions/Progress Updates:    Pt in supine to sit with mod assist after rolling to the left side of the bed.  Increased time for all movements secondary to increased back pain.  Once in sitting, he was able to sit with close supervision with BUE support.  He next completed sit to stand from the elevated bed with max assist using the RW for support.  Increased pain in standing with knees buckling on one occasion.  He was able to stand for approximately one minute before needing to rest.  He next completed scoot pivot transfer to the wheelchair with mod assist and was then transported via wheelchair down to the therapy gym.  He then completed transfer to the mat at the same mod assist level.  He was able to roll to the left side with supervision and maintain for comfort and stretching of his UEs on the mat.  Pillow was placed between his legs as well.  Increased pain was noted with rolling back to the right and transition to sitting with max assist from right sidelying to sit.  He was then rolled back to the room where he completed transfer to the bed at the same level with max assist for sit to supine secondary to increased pain.  Noted right skin tear just above the elbow from unknown cause.  Therapist applied dressing to the area.  Pt was left with spouse and call button and phone in reach.  Nursing called for pain medicine request.     Therapy Documentation Precautions:  Precautions Precautions: Fall, Cervical, Other (comment) Precaution Comments: ACDF on 4/12, pt reports they d/c-ed collar 1 week after surgery Required Braces or Orthoses: Spinal Brace Spinal Brace: Lumbar corset Other Brace: for comfort Restrictions Weight Bearing Restrictions: No  Pain: Pain Assessment Pain Scale: Faces Faces Pain Scale: Hurts even more Pain Type: Acute pain Pain Location: Back Pain Orientation: Lower Pain Descriptors / Indicators: Discomfort Pain Onset: With Activity Pain Intervention(s): Repositioned Multiple Pain Sites: No ADL: See Care Tool Section for some details of mobility and selfcare  Therapy/Group: Individual Therapy  Berkley Cronkright  OTR/L 04/10/2020, 12:13 PM

## 2020-04-10 NOTE — Progress Notes (Addendum)
Progress Note  Patient Name: Johnathan Murray Date of Encounter: 04/10/2020  Primary Cardiologist: Jenean Lindau, MD   Subjective   Clinically still in atrial flutter RVR rates in the 130-140s.  TEE-DCCV Monday.  Inpatient Medications    Scheduled Meds: . amiodarone  400 mg Oral BID  . apixaban  5 mg Oral BID  . vitamin C  500 mg Oral Daily  . Chlorhexidine Gluconate Cloth  6 each Topical BID  . docusate sodium  100 mg Oral BID  . feeding supplement (PRO-STAT SUGAR FREE 64)  30 mL Oral TID  . insulin aspart  0-15 Units Subcutaneous TID WC  . insulin aspart  3 Units Subcutaneous TID WC  . insulin glargine  10 Units Subcutaneous BID  . iron polysaccharides  150 mg Oral Q breakfast  . levothyroxine  25 mcg Oral Q0600  . methylPREDNISolone acetate  80 mg Intra-articular Once  . oxyCODONE  10 mg Oral Q12H  . pregabalin  100 mg Oral BID  . Ensure Max Protein  11 oz Oral BID  . senna  1 tablet Oral BID  . vitamin B-12  1,000 mcg Oral Daily   Continuous Infusions: . ampicillin (OMNIPEN) IV 2 g (04/10/20 0414)  . cefTRIAXone (ROCEPHIN)  IV 2 g (04/10/20 0849)   PRN Meds: acetaminophen, bisacodyl, cyclobenzaprine, ondansetron **OR** ondansetron (ZOFRAN) IV, oxyCODONE, sodium chloride flush, sorbitol, traZODone   Vital Signs    Vitals:   04/09/20 2201 04/10/20 0229 04/10/20 0543 04/10/20 0630  BP: 106/74 111/85  104/76  Pulse: (!) 139 (!) 141  (!) 138  Resp:  15  14  Temp: 99.5 F (37.5 C) 99 F (37.2 C)  98.8 F (37.1 C)  TempSrc:  Oral    SpO2: 99% 100%  100%  Weight:   79.4 kg   Height:        Intake/Output Summary (Last 24 hours) at 04/10/2020 0912 Last data filed at 04/10/2020 0546 Gross per 24 hour  Intake 1062.69 ml  Output 1350 ml  Net -287.31 ml   Last 3 Weights 04/10/2020 04/08/2020 04/07/2020  Weight (lbs) 175 lb 0.7 oz 181 lb 14.1 oz 181 lb 14.1 oz  Weight (kg) 79.4 kg 82.5 kg 82.5 kg      Telemetry    N/A - Personally Reviewed  ECG      No new tracings - Personally Reviewed  Physical Exam   GEN: No acute distress.   Neck: No JVD Cardiac: irregular rhythm, tachycardic rate  Respiratory: Clear to auscultation bilaterally. GI: Soft, nontender, non-distended  MS: No edema; B knee swelling Neuro:  Nonfocal  Psych: Normal affect   Labs    High Sensitivity Troponin:   Recent Labs  Lab 03/20/20 0331  TROPONINIHS 48*      Chemistry Recent Labs  Lab 04/07/20 0432 04/08/20 1131 04/09/20 0411  NA 136 135 135  K 4.2 4.0 3.8  CL 101 100 100  CO2 26 25 27   GLUCOSE 175* 226* 163*  BUN 21 22 19   CREATININE 1.32* 1.60* 1.40*  CALCIUM 9.0 9.0 8.7*  PROT  --  6.2* 6.1*  ALBUMIN  --  1.9* 1.9*  AST  --  60* 38  ALT  --  94* 76*  ALKPHOS  --  106 93  BILITOT  --  1.2 0.9  GFRNONAA 55* 44* 52*  GFRAA >60 51* 60*  ANIONGAP 9 10 8      Hematology Recent Labs  Lab 04/06/20 [id number] 04/07/20 [phone]  04/08/20 1131  WBC 9.7 7.7 10.5  RBC 2.47* 2.54* 2.64*  HGB 7.4* 7.6* 7.7*  HCT 23.3* 23.9* 24.6*  MCV 94.3 94.1 93.2  MCH 30.0 29.9 29.2  MCHC 31.8 31.8 31.3  RDW 15.4 15.5 15.6*  PLT 308 340 330    BNPNo results for input(s): BNP, PROBNP in the last 168 hours.   DDimer No results for input(s): DDIMER in the last 168 hours.   Radiology    No results found.  Cardiac Studies   TEE 03/26/20: 1. Left ventricular ejection fraction, by estimation, is 60 to 65%. The  left ventricle has normal function. The left ventricle has no regional  wall motion abnormalities.  2. Right ventricular systolic function is normal. The right ventricular  size is normal. There is normal pulmonary artery systolic pressure.  3. No left atrial/left atrial appendage thrombus was detected.  4. The mitral valve is myxomatous. Moderate mitral valve regurgitation.  No evidence of mitral stenosis.  5. The aortic valve has been repaired/replaced. Aortic valve  regurgitation is not visualized. No aortic stenosis is present. There is a   23 mm Edwards bioprosthetic valve present in the aortic position.  Procedure Date: 08/16/2016. Echo findings are  consistent with normal structure and function of the aortic valve  prosthesis.  6. The inferior vena cava is normal in size with greater than 50%  respiratory variability, suggesting right atrial pressure of 3 mmHg.    Echo 03/22/20: 1. There is no obvious vegetation, however transaortic gradients across  the bioprosthetic valve are increased from the prior study on 01/16/2020,  mean gradient 28 mmHg, previously 16 mmHg. Consider TEE for further  evaluation.  2. Left ventricular ejection fraction, by estimation, is 55 to 60%. The  left ventricle has normal function. The left ventricle has no regional  wall motion abnormalities. There is mild concentric left ventricular  hypertrophy. Left ventricular diastolic  parameters are consistent with Grade I diastolic dysfunction (impaired  relaxation). Elevated left atrial pressure.  3. Right ventricular systolic function is normal. The right ventricular  size is normal.  4. Left atrial size was mildly dilated.  5. The mitral valve is normal in structure. Mild mitral valve  regurgitation. No evidence of mitral stenosis.  6. The aortic valve has been repaired/replaced. Aortic valve  regurgitation is not visualized. No aortic stenosis is present. There is a  23 mm Edwards-SAPIEN valve present in the aortic position. Aortic valve  mean gradient measures 28.0 mmHg.  7. The inferior vena cava is normal in size with greater than 50%  respiratory variability, suggesting right atrial pressure of 3 mmHg.   Patient Profile     69 y.o. male with a history of CAD s/p CABG in 2017 with most recent cath in 01/2020 showing 2/3 grafts occluded, aortic stenosis s/p AVR at time of CABG, paroxysmal atrial fibrillation on Eliquis, hypertension, dyslipidemia, diabetes mellitus, recent cervical discectomy who is currently in CIR following  prolonged hospitalization for sepsis. Cardiology consulted today for the evaluation of atrial flutter with RVR   Assessment & Plan    Atrial flutter with RVR Hx of PAF Chronic anticoagulation - held for joint aspiration - clinically tachcyardic - plan for TEE-guided DCCV on Monday - may be difficult to rate control given his ongoing infection - continue PO amiodarone load   CAD s/p CABG - cath 01/2020 with 2/3 grafts occluded with patent LIMA-LAD - no ASA with eliquis - zetia on hold for LFTs   Aortic stenosis  s/p AVR 2017 - good valve structure and function on recent TEE for bacteremia   Hypertension - home amlodipine on hold for marginal pressures       For questions or updates, please contact [facility] Please consult [email or web address] for contact info under        Signed, Ledora Bottcher, PA  04/10/2020, 9:12 AM    Personally seen and examined. Agree with above.  Still in atrial flutter 138.  Symptomatic.  Feels his palpitations.  Feels worn out.  He is alert and oriented with regular tachycardia.  He is currently getting amiodarone loaded with 400 twice a day.  Not using IV amiodarone since he is in rehab.  Plan is for TEE cardioversion on Monday.  Hopefully he will stay in normal rhythm given his underlying discitis/knee infection.  No evidence of angina with his coronary disease.  He continues with Eliquis.  Reason for TEE is that he has been off and on his Eliquis for back procedure.  Prior aortic valve replacement 2017.  TEE will also give [place] another opportunity to look for any signs of endocarditis.  Candee Furbish, MD

## 2020-04-10 NOTE — Progress Notes (Signed)
[facility]    Date of Admission:  04/07/2020              ID: Abdulbasit Mccoury is a 69 y.o. 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 Still feels heart racing. He is tolerate some PT. No fever since 5/19  Medications:  . amiodarone  400 mg Oral BID  . apixaban  5 mg Oral BID  . vitamin C  500 mg Oral Daily  . Chlorhexidine Gluconate Cloth  6 each Topical BID  . docusate sodium  100 mg Oral BID  . feeding supplement (PRO-STAT SUGAR FREE 64)  30 mL Oral TID  . insulin aspart  0-15 Units Subcutaneous TID WC  . insulin aspart  3 Units Subcutaneous TID WC  . insulin glargine  10 Units Subcutaneous BID  . iron polysaccharides  150 mg Oral Q breakfast  . levothyroxine  25 mcg Oral Q0600  . methylPREDNISolone acetate  80 mg Intra-articular Once  . oxyCODONE  10 mg Oral Q12H  . pregabalin  100 mg Oral BID  . Ensure Max Protein  11 oz Oral BID  . senna  1 tablet Oral BID  . vitamin B-12  1,000 mcg Oral Daily    Objective: Vital signs in last 24 hours: Temp:  [98.3 F (36.8 C)-99.8 F (37.7 C)] 99.8 F (37.7 C) (05/21 1039) Pulse Rate:  [138-141] 138 (05/21 1039) Resp:  [14-18] 16 (05/21 1039) BP: (102-118)/(73-86) 115/80 (05/21 1039) SpO2:  [97 %-100 %] 100 % (05/21 1039) Weight:  [79.4 kg] 79.4 kg (05/21 0543) Physical Exam  Constitutional: He is oriented to person, place, and time. He appears well-developed and well-nourished. No distress.  HENT:  Mouth/Throat: Oropharynx is clear and moist. No oropharyngeal exudate.  Cardiovascular: tachycardia, regular rhythm and normal heart sounds. Exam reveals no gallop and no friction rub.  No murmur heard.  Pulmonary/Chest: Effort normal and breath sounds normal. No respiratory distress. He has no wheezes.  Abdominal: Soft. Bowel sounds are normal. He exhibits no distension. There is no tenderness.  Ext:  bilateral knees not warm to touch Neurological: He is alert and oriented to person, place, and time.  Skin: Skin is warm and dry. No rash noted. No erythema.  Psychiatric: He has a normal mood and affect. His behavior is normal.     Lab Results Recent Labs    04/08/20 1131 04/09/20 0411  WBC 10.5  --   HGB 7.7*  --   HCT 24.6*  --   NA 135 135  K 4.0 3.8  CL 100 100  CO2 25 27  BUN 22 19  CREATININE 1.60* 1.40*   Liver Panel Recent Labs    04/08/20 1131 04/09/20 0411  PROT 6.2* 6.1*  ALBUMIN 1.9* 1.9*  AST 60* 38  ALT 94* 76*  ALKPHOS 106 93  BILITOT 1.2 0.9    Microbiology: Fluid culture negative Studies/Results: No results found.   Assessment/Plan: Disseminated enterococcal disease = will keep on dual therapy of ampicillin plus ceftriaxone for the time being. Will follow up on culture from disc aspirate and knee aspirate.   Aflutter = planned to get TEE and cardioversion on Monday. Also will be helpful to ensure no vegetations/thrombus since echo on 5/6 for comparisons  Fever = unclear if related to gouty flare?  [facility] Cell: [phone] Pager: [phone]  04/10/2020, 12:28 PM

## 2020-04-10 NOTE — Plan of Care (Signed)
  Problem: Consults Goal: RH SPINAL CORD INJURY PATIENT EDUCATION Description:  See Patient Education module for education specifics.  Outcome: Progressing   Problem: SCI BOWEL ELIMINATION Goal: RH STG MANAGE BOWEL WITH ASSISTANCE Description: STG Manage Bowel with mod Assistance. Outcome: Progressing Goal: RH STG SCI MANAGE BOWEL WITH MEDICATION WITH ASSISTANCE Description: STG SCI Manage bowel with medication with min assistance. Outcome: Progressing Goal: RH STG SCI MANAGE BOWEL PROGRAM W/ASSIST OR AS APPROPRIATE Description: STG SCI Manage bowel program with mod assist or as appropriate. Outcome: Progressing   Problem: SCI BLADDER ELIMINATION Goal: RH STG MANAGE BLADDER WITH ASSISTANCE Description: STG Manage Bladder With min Assistance Outcome: Progressing Goal: RH STG MANAGE BLADDER WITH MEDICATION WITH ASSISTANCE Description: STG Manage Bladder With Medication With min Assistance. Outcome: Progressing   Problem: RH SKIN INTEGRITY Goal: RH STG SKIN FREE OF INFECTION/BREAKDOWN Description: Skin to remain free from breakdown and infection with min assist while on CIR Outcome: Progressing Goal: RH STG MAINTAIN SKIN INTEGRITY WITH ASSISTANCE Description: STG Maintain Skin Integrity With min Assistance. Outcome: Progressing   Problem: RH SAFETY Goal: RH STG ADHERE TO SAFETY PRECAUTIONS W/ASSISTANCE/DEVICE Description: STG Adhere to Safety Precautions With min Assistance appropriate assistive Device. Outcome: Progressing Goal: RH STG DECREASED RISK OF FALL WITH ASSISTANCE Description: STG Decreased Risk of Fall With min Assistance. Outcome: Progressing   Problem: RH PAIN MANAGEMENT Goal: RH STG PAIN MANAGED AT OR BELOW PT'S PAIN GOAL Description: <3 on a 0-10 pain scale. Outcome: Progressing   Problem: RH KNOWLEDGE DEFICIT SCI Goal: RH STG INCREASE KNOWLEDGE OF SELF CARE AFTER SCI Description: Patient and caregiver will demonstrate knowledge of medication regimen,  dietary restrictions, safety precautions, and follow up care with the MD post discharge with min assist from [place] staff. Outcome: Progressing

## 2020-04-11 ENCOUNTER — Inpatient Hospital Stay (HOSPITAL_COMMUNITY): Payer: BC Managed Care – PPO | Admitting: Physical Therapy

## 2020-04-11 ENCOUNTER — Inpatient Hospital Stay (HOSPITAL_COMMUNITY): Payer: BC Managed Care – PPO | Admitting: Occupational Therapy

## 2020-04-11 DIAGNOSIS — I483 Typical atrial flutter: Secondary | ICD-10-CM

## 2020-04-11 LAB — GLUCOSE, CAPILLARY
Glucose-Capillary: 131 mg/dL — ABNORMAL HIGH (ref 70–99)
Glucose-Capillary: 154 mg/dL — ABNORMAL HIGH (ref 70–99)
Glucose-Capillary: 160 mg/dL — ABNORMAL HIGH (ref 70–99)
Glucose-Capillary: 136 mg/dL — ABNORMAL HIGH (ref 70–99)

## 2020-04-11 LAB — BODY FLUID CULTURE: Culture: NO GROWTH

## 2020-04-11 LAB — URINALYSIS, ROUTINE W REFLEX MICROSCOPIC
Bilirubin Urine: NEGATIVE
Glucose, UA: NEGATIVE mg/dL
Hgb urine dipstick: NEGATIVE
Ketones, ur: NEGATIVE mg/dL
Leukocytes,Ua: NEGATIVE
Nitrite: NEGATIVE
Protein, ur: NEGATIVE mg/dL
Specific Gravity, Urine: 1.009 (ref 1.005–1.030)
pH: 7 (ref 5.0–8.0)

## 2020-04-11 MED ORDER — PREGABALIN 50 MG PO CAPS
100.0000 mg | ORAL_CAPSULE | Freq: Three times a day (TID) | ORAL | Status: DC
  Administered 2020-04-11 – 2020-04-13 (×7): 100 mg via ORAL
  Filled 2020-04-11: qty 2
  Filled 2020-04-11: qty 4
  Filled 2020-04-11 (×5): qty 2

## 2020-04-11 MED ORDER — OXYCODONE HCL 5 MG PO TABS
10.0000 mg | ORAL_TABLET | ORAL | Status: DC | PRN
  Administered 2020-04-11 – 2020-04-13 (×6): 15 mg via ORAL
  Filled 2020-04-11 (×7): qty 3

## 2020-04-11 MED ORDER — AMIODARONE HCL 200 MG PO TABS
400.0000 mg | ORAL_TABLET | Freq: Once | ORAL | Status: AC
  Administered 2020-04-11: 400 mg via ORAL
  Filled 2020-04-11: qty 2

## 2020-04-11 MED ORDER — AMIODARONE HCL 200 MG PO TABS
400.0000 mg | ORAL_TABLET | Freq: Every day | ORAL | Status: DC
  Administered 2020-04-12: 400 mg via ORAL
  Filled 2020-04-11: qty 2

## 2020-04-11 NOTE — Plan of Care (Signed)
  Problem: SCI BOWEL ELIMINATION Goal: RH STG MANAGE BOWEL WITH ASSISTANCE Description: STG Manage Bowel with mod Assistance. 04/11/2020 1116 by Ander Slade, RN Outcome: Not Progressing; LBM 5/19

## 2020-04-11 NOTE — Progress Notes (Signed)
[facility] PHYSICAL MEDICINE & REHABILITATION PROGRESS NOTE   Subjective/Complaints:  Says pain was doing better but then did therapy and missed a dose of pain meds- was in the bed- didn't get somehow- put pain behind and pain has been "horrific" for last 12-24 hours-  Pulse dropped into 30s-40s yesterday.  However back up today into 80s-90s.   Asking to reduce amiodarone, since heart rate went so low.   ROS:  Pt denies SOB, abd pain, CP, N/V/C/D, and vision changes    Objective:   No results found. No results for input(s): WBC, HGB, HCT, PLT in the last 72 hours. Recent Labs    04/09/20 0411  NA 135  K 3.8  CL 100  CO2 27  GLUCOSE 163*  BUN 19  CREATININE 1.40*  CALCIUM 8.7*    Intake/Output Summary (Last 24 hours) at 04/11/2020 1434 Last data filed at 04/11/2020 0500 Gross per 24 hour  Intake 1320 ml  Output 1000 ml  Net 320 ml     Physical Exam: Vital Signs Blood pressure 102/63, pulse 95, temperature 98.8 F (37.1 C), resp. rate 16, height 5\' 10"  (1.778 m), weight 75.2 kg, SpO2 97 %.  Physical Exam Constitutional: awake, wife holding urinal for pt, voiding; PT in room, upset about pain levels, NAD HENT:  Conjugate gaze Cardiovascular:sounds like in Afib however rate 80s-90s Respiratory:CTA B/L- no W/R/R- good air movement [id number], NT, ND, (+)BS  Musculoskeletal:  Cervical back: Normal range of motion.  Comments: Both knees swollen and tender- L knee still swollen, however still less TTP- suprapatella bursitis noted-  . Low back TTP and he complains of "stabbing pain" still- almost writhing in pain.  Neurological: Ox3 Nocranial nerve deficit. UE motor 5/5. LE 3-/5 prox (pain inhibition from back/knees) to 4/5 with ADF/PF. No focal sensory loss. DTR's 1+ Skin: Skin iswarm. He isnot diaphoretic.  Psychiatric:frustrated as is wife    Assessment/Plan: 1. Functional deficits secondary to Discitis/Osteomyelitis  which require 3+ hours per  day of interdisciplinary therapy in a comprehensive inpatient rehab setting.  Physiatrist is providing close team supervision and 24 hour management of active medical problems listed below.  Physiatrist and rehab team continue to assess barriers to discharge/monitor patient progress toward functional and medical goals  Care Tool:  Bathing    Body parts bathed by patient: Right arm, Left arm, Chest, Abdomen, Front perineal area, Right upper leg, Left upper leg, Face   Body parts bathed by helper: Right lower leg, Buttocks, Left lower leg     Bathing assist Assist Level: Moderate Assistance - Patient 50 - 74%     Upper Body Dressing/Undressing Upper body dressing Upper body dressing/undressing activity did not occur (including orthotics): N/A What is the patient wearing?: Pull over shirt    Upper body assist Assist Level: Minimal Assistance - Patient > 75%    Lower Body Dressing/Undressing Lower body dressing    Lower body dressing activity did not occur: N/A What is the patient wearing?: Pants     Lower body assist Assist for lower body dressing: Total Assistance - Patient < 25%     Toileting Toileting    Toileting assist Assist for toileting: Maximal Assistance - Patient 25 - 49%     Transfers Chair/bed transfer  Transfers assist  Chair/bed transfer activity did not occur: N/A  Chair/bed transfer assist level: Moderate Assistance - Patient 50 - 74%(scoot pivot)     Locomotion Ambulation   Ambulation assist      Assist level:  Moderate Assistance - Patient 50 - 74% Assistive device: Walker-rolling Max distance: 5'   Walk 10 feet activity   Assist  Walk 10 feet activity did not occur: Safety/medical concerns        Walk 50 feet activity   Assist Walk 50 feet with 2 turns activity did not occur: Safety/medical concerns         Walk 150 feet activity   Assist Walk 150 feet activity did not occur: Safety/medical concerns         Walk  10 feet on uneven surface  activity   Assist Walk 10 feet on uneven surfaces activity did not occur: Safety/medical concerns         Wheelchair     Assist Will patient use wheelchair at discharge?: Yes(pending progress and pain management) Type of Wheelchair: Manual    Wheelchair assist level: Supervision/Verbal cueing Max wheelchair distance: 150'    Wheelchair 50 feet with 2 turns activity    Assist        Assist Level: Supervision/Verbal cueing   Wheelchair 150 feet activity     Assist      Assist Level: Supervision/Verbal cueing   Blood pressure 102/63, pulse 95, temperature 98.8 F (37.1 C), resp. rate 16, height 5\' 10"  (1.778 m), weight 75.2 kg, SpO2 97 %.  Medical Problem List and Plan: 1.Decreased functional mobilitysecondary to sepsis/Enterococcusfaecalisbacteremia with septic knees and lumbar discitis/osteomyelitis -patient may shower -ELOS/Goals: 12-14 days, supervision to mod I 2. Antithrombotics: -DVT/anticoagulation:Eliquis 5mg  BID -antiplatelet therapy: N/A 3. Pain Management:Flexeril as needed, oxycodone as needed -heat, kpad as appropriate, lumbar corset, ice for knees. -ROM, core strengthening with therapies  5/20- added Oxycontin 10 mg BID, increased Lyrica to 100 mg BID and changed Flexeril to 5 mg TID prn for muscle spasms, since so sedating.   5/21- pain better controlled  5/22- sounds like got behind on pain meds yesterday- will increase oxycodone to 10-15 mg Q4 hours prn- explained could make more sleepy- increased lyrica to 100 mg TID 4. Mood:Trazodone as needed -antipsychotic agents: N/C 5. Neuropsych: This patientiscapable of making decisions on hisown behalf. 6. Skin/Wound Care:Routine skin checks 7. Fluids/Electrolytes/Nutrition:Routine in and outs with follow-up chemistries 8. Acute osteomyelitis/discitis L4-5. Neurosurgery follow-up  no surgical intervention.Lumbar corsetfor comfort.  -S/p FNA biopsy L4-5 disc today.Await aspiration results  5/20- ID put pt also on Rocephin as well   9.Recent C3-4 ACDF 03/02/2020 per [name] for cervical myelopathy. Cervical collar has been discontinued. 10. Septic knee. Status post aspiration and steroid injection with I&D of bilateral knees 03/24/2020. Follow-up orthopedic services  5/20- they attempted another aspiration- not real effective per pt- still has suprapatellar bursitis type swelling  5/22- Cx' no growth so far.  11. ID/Enterococcus bacteremia. Continue intravenous ampicillin through 05/20/2020. Follow-up infectious disease  5/20- on Ampicillin AND now Rocephin til 05/20/20  5/21- back Cx begative/not final  5/22- no change per ID note-  12. PAF/bioprosthetic aortic valve replacement 2017. Await plan to resume Eliquis. Amiodarone currently on hold  5/19- Back in Afib with RVR- called Cards to see if can restart Amiodarone.   5/20- restarted Amio at 400 mg BID- rate still 130s-140  5/22- rate much lower- was 30s-40-s yesterday- however in 80s-90s rate today- changed amio to Qday but also paged Cards.  13. AKI. Follow-up chemistries 14. Diabetes mellitus. Hemoglobin A1c 6.8. Currently on NovoLog 3 units 3 times daily as well as Lantus insulin 10 units twice daily. Patient on Glucophage prior to admission 500 mg  nightly. Currently on hold due to AKI. Titrate regime to effect CBG (last 3)  Recent Labs    04/10/20 2123 04/11/20 0612 04/11/20 1221  GLUCAP 85 131* 154*    5/22- BGs well controlled- con't meds 15. Acute on chronic anemia. Continue Niferex. Follow-up CBC 16. Hypothyroidism. Synthroid 17. Nerve pain in RLE- will add Lyrica 50 mg BID and monitor   5/20- increased to 100 mg BID  5/22- increased lyrica to 100 mg TID   LOS: 4 days A FACE TO FACE EVALUATION WAS PERFORMED  Jennamarie Goings 04/11/2020, 2:34 PM

## 2020-04-11 NOTE — Progress Notes (Signed)
Physical Therapy Session Note  Patient Details  Name: Johnathan Murray MRN: [id number] Date of Birth: 1951/04/29  Today's Date: 04/11/2020 PT Individual Time: 3664-4034 and 1305-1420 PT Individual Time Calculation (min): 55 min and 75 min  Short Term Goals: Week 1:  PT Short Term Goal 1 (Week 1): Pt will perform supine<>sit with minA. PT Short Term Goal 2 (Week 1): Pt will perform sit to stand with minA. PT Short Term Goal 3 (Week 1): Pt will perform bed>chair transfer with minA. PT Short Term Goal 4 (Week 1): Pt will ambulate 58' with minA.  Skilled Therapeutic Interventions/Progress Updates: Tx1: Pt presented in bed with wife present and MD present for am assessment. Pt with c/o back pain 5/10 with pt pre-medicated. Per MD more gentle therex this am for pain management. Pt session focused on relaxation techniques and bed mobility WLP. Pt's wife with questions regarding performing bed mobility without nsg present as was able to do in acute. Discussed being able to sign off once pt able to tolerate supine to/from sit with less assist and improved pain. Pt's wife threaded pants and pt performed rolling L/R with minA and use of bed features in log roll technique to allow PTA and wife to pull pants over hips. Pt required time between rolling to allow pain to subside with all mobility. Pt then rolled to R with minA and performed sidelying to sit at EOB with modA and increased time. Pt with noted increase in pain but was able to sit side of bed for approx 5 min before requesting to return to supine. Transferred sit to sidelying with minA primarily for BLE management and waited until pain subsided to return to supine. Wife discussed if safe to perform PROM/AAROM with stretching which this PTA was agreeable to. Wife was able to assist pt performed SKC, [place] and PTA demonstrated hamstring stretch to pt's tolerance. Pt continuing to have residual pain from bed mobility with intermittent spasms. Deferred  additional activity at this time with PTA placing pillow under pt's R hip to offload with pt stating some relief. Pt left in bed with alarm on, wife present and needs met.   Tx2: Pt presented in bed completing lunch with wife Johnathan Murray present agreeable to therapy. Pt states currently with decreased back pain but concerned about increased back pain with mobility. Nsg notified and received pain meds prior to OOB attempts. Participated in UE strengthening for general conditioning including (all with 3lb dowel) chest press, shoulder flexion to 90, SA punches, bicep curls 2 x 10-12, pt also performed triceps extension with level 2 band 2 x 15. Performed long axis distraction with grade l oscillations for pain management with pt stating moderate relief. Pt then performed supine to sidelying on flat bed with minA followed by sidelying to sit with min/modA due to increased spasms in back once in upright position. PTA donned LSO once sitting EOB with pt stating finding minimal relief. Pt required frequent repositioning while sitting EOB for pain relief. Pt agreeable to attempt transfer to [facility] due to on/off urge for BM. Pt required modA for STS with pt unable to fully extend knees but eventually able to come to improved erect posture. Performed stand pivot to [facility] with modA x 1 (+2 present for safety) (-void/BM). Pt then returned to EOB in same manner as prior. Performed sit to sidelying with minA for BLE management. Pt left in bed at end of session with bed alarm on, call bell within reach and needs met.  Therapy Documentation Precautions:  Precautions Precautions: Fall, Cervical, Other (comment) Precaution Comments: ACDF on 4/12, pt reports they d/c-ed collar 1 week after surgery Required Braces or Orthoses: Spinal Brace Spinal Brace: Lumbar corset Other Brace: for comfort Restrictions Weight Bearing Restrictions: No General: PT Missed Treatment Reason: Pain   Therapy/Group: Individual Therapy  Wynetta Seith  Madisyn Mawhinney, PTA  04/11/2020, 12:42 PM

## 2020-04-11 NOTE — Significant Event (Signed)
During routine vital sign check, patient's MEWS recorded 5(red) triggered by HR, temp, and BP. Pt resting in bed; denies chest discomfort nor SOB. Lonia Mad., Charge RN made aware. Dr. Dagoberto Ligas informed  with orders. MEWS protocol initiated.

## 2020-04-11 NOTE — Progress Notes (Signed)
MEWS remains red (4). UA/cuture specimen sent. Agricultural consultant and Dr. Dagoberto Ligas updated. Will continue to monitor.

## 2020-04-11 NOTE — Progress Notes (Signed)
Occupational Therapy Session Note  Patient Details  Name: Johnathan Murray MRN: [id number] Date of Birth: 08-09-1951  Today's Date: 04/11/2020 OT Individual Time: [id number] OT Individual Time Calculation (min): 27 min   Short Term Goals: Week 1:  OT Short Term Goal 1 (Week 1): Patient will thread BLE through LB clothing with Min A and AE PRN. OT Short Term Goal 2 (Week 1): Patient will complete toilet transfer to [facility] with LRAD and Min A. OT Short Term Goal 3 (Week 1): Patient will complete 3/3 parts of toileting task in sitting/standing.  Skilled Therapeutic Interventions/Progress Updates:    Pt greeted in bed with wife, [name], at bedside. Tx focus was placed on holistic pain mgt education to positively impact his ability to participate in functional tasks. Discussed with both pt and wife the concept of pain perception, delineating ANS and PNS activity, emphasizing the effect of PNS in regards to dampening pain perception. Education, demonstration, and practice completed in regards to diaphragmatic breathing and mindful visualization techniques. Also discussed using aromatherapy interventions and having comfort items from home in his hospital room. Pt and wife were collaborative during discussion, appearing receptive to education. Left pt in care of RN with spouse still at bedside.     Therapy Documentation Precautions:  Precautions Precautions: Fall, Cervical, Other (comment) Precaution Comments: ACDF on 4/12, pt reports they d/c-ed collar 1 week after surgery Required Braces or Orthoses: Spinal Brace Spinal Brace: Lumbar corset Other Brace: for comfort Restrictions Weight Bearing Restrictions: No Pain: in back, pt premedicated    ADL: ADL Upper Body Bathing: Minimal assistance Where Assessed-Upper Body Bathing: Edge of bed Lower Body Bathing: Moderate assistance Where Assessed-Lower Body Bathing: Edge of bed Upper Body Dressing: Minimal assistance Where Assessed-Upper Body  Dressing: Edge of bed Lower Body Dressing: Moderate assistance Where Assessed-Lower Body Dressing: Edge of bed Toileting: Maximal assistance Where Assessed-Toileting: Bedside Commode Toilet Transfer: Moderate assistance Toilet Transfer Method: Stand pivot Toilet Transfer Equipment: Bedside commode     Therapy/Group: Individual Therapy  Jilene Spohr A Elishua Radford 04/11/2020, 12:46 PM

## 2020-04-12 ENCOUNTER — Inpatient Hospital Stay (HOSPITAL_COMMUNITY): Payer: BC Managed Care – PPO

## 2020-04-12 DIAGNOSIS — M4640 Discitis, unspecified, site unspecified: Secondary | ICD-10-CM | POA: Diagnosis not present

## 2020-04-12 DIAGNOSIS — I483 Typical atrial flutter: Secondary | ICD-10-CM | POA: Diagnosis not present

## 2020-04-12 LAB — GLUCOSE, CAPILLARY
Glucose-Capillary: 150 mg/dL — ABNORMAL HIGH (ref 70–99)
Glucose-Capillary: 139 mg/dL — ABNORMAL HIGH (ref 70–99)
Glucose-Capillary: 95 mg/dL (ref 70–99)
Glucose-Capillary: 142 mg/dL — ABNORMAL HIGH (ref 70–99)

## 2020-04-12 MED ORDER — AMIODARONE HCL 200 MG PO TABS
400.0000 mg | ORAL_TABLET | Freq: Two times a day (BID) | ORAL | Status: DC
  Administered 2020-04-12 – 2020-04-13 (×2): 400 mg via ORAL
  Filled 2020-04-12 (×2): qty 2

## 2020-04-12 MED ORDER — SODIUM CHLORIDE 0.9 % IV SOLN: INTRAVENOUS | Status: DC

## 2020-04-12 MED ORDER — TRAZODONE HCL 50 MG PO TABS
75.0000 mg | ORAL_TABLET | Freq: Every day | ORAL | Status: DC
  Administered 2020-04-12: 75 mg via ORAL
  Filled 2020-04-12: qty 2

## 2020-04-12 NOTE — Progress Notes (Signed)
Patient MEWS 5 (red). Caryl Pina, Charge RN made aware. Dr. Dagoberto Ligas informed. Tylenol given. Patient stated he feels fine. Will continue to monitor.

## 2020-04-12 NOTE — Progress Notes (Signed)
ID PROGRESS NOTE   Still having fevers up to 100.11F still elevated HR, afib/flutter at rest  5/18 Disc aspirate cx - reincubating - growth noted   Concern about source control since he still has fevers.  - will continue on ceftriaxone plus ampicillin - has upcoming TEE for cardioversion, will ask Dr Marlou Porch if can quickly look again at valves to ensure no vegetation. - cx from knee aspirate NGTD thus not needing repeat wash out  [place] Amon [facility]. [facility] for Infectious Diseases [phone]

## 2020-04-12 NOTE — Progress Notes (Signed)
   04/12/20 0832  Assess: MEWS Score  Temp 98.4 F (36.9 C)  BP 102/74  Pulse Rate (!) 133  Resp 18  SpO2 99 %  O2 Device Room Air  Assess: MEWS Score  MEWS Temp 0  MEWS Systolic 0  MEWS Pulse 3  MEWS RR 0  MEWS LOC 0  MEWS Score 3  MEWS Score Color Yellow  Assess: if the MEWS score is Yellow or Red  Were vital signs taken at a resting state? Yes  Focused Assessment Documented focused assessment  Early Detection of Sepsis Score *See Row Information* Medium  MEWS guidelines implemented *See Row Information* Yes  Treat  MEWS Interventions Administered prn meds/treatments;Administered scheduled meds/treatments  Take Vital Signs  Increase Vital Sign Frequency  Yellow: Q 2hr X 2 then Q 4hr X 2, if remains yellow, continue Q 4hrs  Escalate  MEWS: Escalate Yellow: discuss with charge nurse/RN and consider discussing with provider and RRT  Notify: Charge Nurse/RN  Name of Charge Nurse/RN Notified ashley rn  Notify: Provider  Provider Name/Title dr. Dagoberto Ligas  Date Provider Notified 04/12/20  Time Provider Notified [phone]  Notification Type Face-to-face  Notification Reason Other (Comment)  Response No new orders  Date of Provider Response 04/12/20  Time of Provider Response [phone]  Document  Patient Outcome Other (Comment)  Progress note created (see row info) Yes

## 2020-04-12 NOTE — Progress Notes (Signed)
Progress Note  Patient Name: Johnathan Murray Date of Encounter: 04/12/2020  Primary Cardiologist: Jenean Lindau, MD   Subjective   Clinically still in atrial flutter RVR rates in the 130-140s. Denies SOB. Febrile.  TEE-DCCV Monday.  Inpatient Medications    Scheduled Meds: . amiodarone  400 mg Oral BID  . apixaban  5 mg Oral BID  . vitamin C  500 mg Oral Daily  . Chlorhexidine Gluconate Cloth  6 each Topical BID  . docusate sodium  100 mg Oral BID  . feeding supplement (PRO-STAT SUGAR FREE 64)  30 mL Oral TID  . insulin aspart  0-15 Units Subcutaneous TID WC  . insulin aspart  3 Units Subcutaneous TID WC  . insulin glargine  10 Units Subcutaneous BID  . iron polysaccharides  150 mg Oral Q breakfast  . levothyroxine  25 mcg Oral Q0600  . methylPREDNISolone acetate  80 mg Intra-articular Once  . oxyCODONE  10 mg Oral Q12H  . pregabalin  100 mg Oral TID  . Ensure Max Protein  11 oz Oral BID  . senna  1 tablet Oral BID  . traZODone  75 mg Oral QHS  . vitamin B-12  1,000 mcg Oral Daily   Continuous Infusions: . ampicillin (OMNIPEN) IV 2 g (04/12/20 0925)  . cefTRIAXone (ROCEPHIN)  IV 2 g (04/12/20 0807)   PRN Meds: acetaminophen, bisacodyl, cyclobenzaprine, ondansetron **OR** ondansetron (ZOFRAN) IV, oxyCODONE, sodium chloride flush, sorbitol   Vital Signs    Vitals:   04/12/20 0032 04/12/20 0427 04/12/20 0832 04/12/20 1043  BP: 101/66 95/63 102/74 118/80  Pulse: (!) 136 (!) 138 (!) 133 (!) 121  Resp: 15 16 18 18   Temp: 98.7 F (37.1 C) 97.7 F (36.5 C) 98.4 F (36.9 C) 98.4 F (36.9 C)  TempSrc: Axillary Oral    SpO2: 98% 100% 99% 99%  Weight:      Height:        Intake/Output Summary (Last 24 hours) at 04/12/2020 1120 Last data filed at 04/12/2020 0700 Gross per 24 hour  Intake 560 ml  Output 2225 ml  Net -1665 ml   Last 3 Weights 04/11/2020 04/10/2020 04/08/2020  Weight (lbs) 165 lb 12.6 oz 175 lb 0.7 oz 181 lb 14.1 oz  Weight (kg) 75.2 kg 79.4  kg 82.5 kg      Telemetry    N/A - Personally Reviewed  ECG    No new tracings - Personally Reviewed  Physical Exam   GEN: No acute distress.   Neck: No JVD Cardiac: irregular rhythm, tachycardic rate  Respiratory: Clear to auscultation bilaterally. GI: Soft, nontender, non-distended  MS: No edema; B knee swelling Neuro:  Nonfocal  Psych: Normal affect   Labs    High Sensitivity Troponin:   Recent Labs  Lab 03/20/20 0331  TROPONINIHS 48*      Chemistry Recent Labs  Lab 04/07/20 0432 04/08/20 1131 04/09/20 0411  NA 136 135 135  K 4.2 4.0 3.8  CL 101 100 100  CO2 26 25 27   GLUCOSE 175* 226* 163*  BUN 21 22 19   CREATININE 1.32* 1.60* 1.40*  CALCIUM 9.0 9.0 8.7*  PROT  --  6.2* 6.1*  ALBUMIN  --  1.9* 1.9*  AST  --  60* 38  ALT  --  94* 76*  ALKPHOS  --  106 93  BILITOT  --  1.2 0.9  GFRNONAA 55* 44* 52*  GFRAA >60 51* 60*  ANIONGAP 9 10 8  Hematology Recent Labs  Lab 04/06/20 0438 04/07/20 0432 04/08/20 1131  WBC 9.7 7.7 10.5  RBC 2.47* 2.54* 2.64*  HGB 7.4* 7.6* 7.7*  HCT 23.3* 23.9* 24.6*  MCV 94.3 94.1 93.2  MCH 30.0 29.9 29.2  MCHC 31.8 31.8 31.3  RDW 15.4 15.5 15.6*  PLT 308 340 330    BNPNo results for input(s): BNP, PROBNP in the last 168 hours.   DDimer No results for input(s): DDIMER in the last 168 hours.   Radiology    No results found.  Cardiac Studies   TEE 03/26/20: 1. Left ventricular ejection fraction, by estimation, is 60 to 65%. The  left ventricle has normal function. The left ventricle has no regional  wall motion abnormalities.  2. Right ventricular systolic function is normal. The right ventricular  size is normal. There is normal pulmonary artery systolic pressure.  3. No left atrial/left atrial appendage thrombus was detected.  4. The mitral valve is myxomatous. Moderate mitral valve regurgitation.  No evidence of mitral stenosis.  5. The aortic valve has been repaired/replaced. Aortic valve    regurgitation is not visualized. No aortic stenosis is present. There is a  23 mm Edwards bioprosthetic valve present in the aortic position.  Procedure Date: 08/16/2016. Echo findings are  consistent with normal structure and function of the aortic valve  prosthesis.  6. The inferior vena cava is normal in size with greater than 50%  respiratory variability, suggesting right atrial pressure of 3 mmHg.    Echo 03/22/20: 1. There is no obvious vegetation, however transaortic gradients across  the bioprosthetic valve are increased from the prior study on 01/16/2020,  mean gradient 28 mmHg, previously 16 mmHg. Consider TEE for further  evaluation.  2. Left ventricular ejection fraction, by estimation, is 55 to 60%. The  left ventricle has normal function. The left ventricle has no regional  wall motion abnormalities. There is mild concentric left ventricular  hypertrophy. Left ventricular diastolic  parameters are consistent with Grade I diastolic dysfunction (impaired  relaxation). Elevated left atrial pressure.  3. Right ventricular systolic function is normal. The right ventricular  size is normal.  4. Left atrial size was mildly dilated.  5. The mitral valve is normal in structure. Mild mitral valve  regurgitation. No evidence of mitral stenosis.  6. The aortic valve has been repaired/replaced. Aortic valve  regurgitation is not visualized. No aortic stenosis is present. There is a  23 mm Edwards-SAPIEN valve present in the aortic position. Aortic valve  mean gradient measures 28.0 mmHg.  7. The inferior vena cava is normal in size with greater than 50%  respiratory variability, suggesting right atrial pressure of 3 mmHg.   Patient Profile     69 y.o. male with a history of CAD s/p CABG in 2017 with most recent cath in 01/2020 showing 2/3 grafts occluded, aortic stenosis s/p AVR at time of CABG, paroxysmal atrial fibrillation on Eliquis, hypertension, dyslipidemia, diabetes  mellitus, recent cervical discectomy who is currently in CIR following prolonged hospitalization for sepsis. Cardiology consulted today for the evaluation of atrial flutter with RVR   Assessment & Plan    Atrial flutter with RVR Hx of PAF Chronic anticoagulation - held for joint aspiration - clinically tachcyardic - plan for TEE-guided DCCV on Monday - may be difficult to rate control given his ongoing infection - continue PO amiodarone load  Fever - will continue on ceftriaxone plus ampicillin - has upcoming TEE for cardioversion, will ask Dr Marlou Porch if  can quickly look again at valves to ensure no vegetation. - ID follows  CAD s/p CABG - cath 01/2020 with 2/3 grafts occluded with patent LIMA-LAD - no ASA with eliquis - zetia on hold for LFTs  Aortic stenosis s/p AVR 2017 - good valve structure and function on recent TEE for bacteremia  Hypertension - home amlodipine on hold for marginal pressures   For questions or updates, please contact [facility] Please consult [email or web address] for contact info under        Signed, Ena Dawley, MD  04/12/2020, 11:20 AM    Personally seen and examined. Agree with above.  Still in atrial flutter 138.  Symptomatic.  Feels his palpitations.  Feels worn out.  He is alert and oriented with regular tachycardia.  He is currently getting amiodarone loaded with 400 twice a day.  Not using IV amiodarone since he is in rehab.  Plan is for TEE cardioversion on Monday.  Hopefully he will stay in normal rhythm given his underlying discitis/knee infection.  No evidence of angina with his coronary disease.  He continues with Eliquis.  Reason for TEE is that he has been off and on his Eliquis for back procedure.  Prior aortic valve replacement 2017.  TEE will also give [place] another opportunity to look for any signs of endocarditis.  Ena Dawley, MD

## 2020-04-12 NOTE — Plan of Care (Signed)
  Problem: SCI BOWEL ELIMINATION Goal: RH STG MANAGE BOWEL WITH ASSISTANCE Description: STG Manage Bowel with mod Assistance. Outcome: Not Progressing; LBM 5/19

## 2020-04-12 NOTE — H&P (View-Only) (Signed)
Progress Note  Patient Name: Johnathan Murray Date of Encounter: 04/12/2020  Primary Cardiologist: Jenean Lindau, MD   Subjective   Clinically still in atrial flutter RVR rates in the 130-140s. Denies SOB. Febrile.  TEE-DCCV Monday.  Inpatient Medications    Scheduled Meds: . amiodarone  400 mg Oral BID  . apixaban  5 mg Oral BID  . vitamin C  500 mg Oral Daily  . Chlorhexidine Gluconate Cloth  6 each Topical BID  . docusate sodium  100 mg Oral BID  . feeding supplement (PRO-STAT SUGAR FREE 64)  30 mL Oral TID  . insulin aspart  0-15 Units Subcutaneous TID WC  . insulin aspart  3 Units Subcutaneous TID WC  . insulin glargine  10 Units Subcutaneous BID  . iron polysaccharides  150 mg Oral Q breakfast  . levothyroxine  25 mcg Oral Q0600  . methylPREDNISolone acetate  80 mg Intra-articular Once  . oxyCODONE  10 mg Oral Q12H  . pregabalin  100 mg Oral TID  . Ensure Max Protein  11 oz Oral BID  . senna  1 tablet Oral BID  . traZODone  75 mg Oral QHS  . vitamin B-12  1,000 mcg Oral Daily   Continuous Infusions: . ampicillin (OMNIPEN) IV 2 g (04/12/20 0925)  . cefTRIAXone (ROCEPHIN)  IV 2 g (04/12/20 0807)   PRN Meds: acetaminophen, bisacodyl, cyclobenzaprine, ondansetron **OR** ondansetron (ZOFRAN) IV, oxyCODONE, sodium chloride flush, sorbitol   Vital Signs    Vitals:   04/12/20 0032 04/12/20 0427 04/12/20 0832 04/12/20 1043  BP: 101/66 95/63 102/74 118/80  Pulse: (!) 136 (!) 138 (!) 133 (!) 121  Resp: 15 16 18 18   Temp: 98.7 F (37.1 C) 97.7 F (36.5 C) 98.4 F (36.9 C) 98.4 F (36.9 C)  TempSrc: Axillary Oral    SpO2: 98% 100% 99% 99%  Weight:      Height:        Intake/Output Summary (Last 24 hours) at 04/12/2020 1120 Last data filed at 04/12/2020 0700 Gross per 24 hour  Intake 560 ml  Output 2225 ml  Net -1665 ml   Last 3 Weights 04/11/2020 04/10/2020 04/08/2020  Weight (lbs) 165 lb 12.6 oz 175 lb 0.7 oz 181 lb 14.1 oz  Weight (kg) 75.2 kg 79.4  kg 82.5 kg      Telemetry    N/A - Personally Reviewed  ECG    No new tracings - Personally Reviewed  Physical Exam   GEN: No acute distress.   Neck: No JVD Cardiac: irregular rhythm, tachycardic rate  Respiratory: Clear to auscultation bilaterally. GI: Soft, nontender, non-distended  MS: No edema; B knee swelling Neuro:  Nonfocal  Psych: Normal affect   Labs    High Sensitivity Troponin:   Recent Labs  Lab 03/20/20 0331  TROPONINIHS 48*      Chemistry Recent Labs  Lab 04/07/20 0432 04/08/20 1131 04/09/20 0411  NA 136 135 135  K 4.2 4.0 3.8  CL 101 100 100  CO2 26 25 27   GLUCOSE 175* 226* 163*  BUN 21 22 19   CREATININE 1.32* 1.60* 1.40*  CALCIUM 9.0 9.0 8.7*  PROT  --  6.2* 6.1*  ALBUMIN  --  1.9* 1.9*  AST  --  60* 38  ALT  --  94* 76*  ALKPHOS  --  106 93  BILITOT  --  1.2 0.9  GFRNONAA 55* 44* 52*  GFRAA >60 51* 60*  ANIONGAP 9 10 8  Hematology Recent Labs  Lab 04/06/20 0438 04/07/20 0432 04/08/20 1131  WBC 9.7 7.7 10.5  RBC 2.47* 2.54* 2.64*  HGB 7.4* 7.6* 7.7*  HCT 23.3* 23.9* 24.6*  MCV 94.3 94.1 93.2  MCH 30.0 29.9 29.2  MCHC 31.8 31.8 31.3  RDW 15.4 15.5 15.6*  PLT 308 340 330    BNPNo results for input(s): BNP, PROBNP in the last 168 hours.   DDimer No results for input(s): DDIMER in the last 168 hours.   Radiology    No results found.  Cardiac Studies   TEE 03/26/20: 1. Left ventricular ejection fraction, by estimation, is 60 to 65%. The  left ventricle has normal function. The left ventricle has no regional  wall motion abnormalities.  2. Right ventricular systolic function is normal. The right ventricular  size is normal. There is normal pulmonary artery systolic pressure.  3. No left atrial/left atrial appendage thrombus was detected.  4. The mitral valve is myxomatous. Moderate mitral valve regurgitation.  No evidence of mitral stenosis.  5. The aortic valve has been repaired/replaced. Aortic valve    regurgitation is not visualized. No aortic stenosis is present. There is a  23 mm Edwards bioprosthetic valve present in the aortic position.  Procedure Date: 08/16/2016. Echo findings are  consistent with normal structure and function of the aortic valve  prosthesis.  6. The inferior vena cava is normal in size with greater than 50%  respiratory variability, suggesting right atrial pressure of 3 mmHg.    Echo 03/22/20: 1. There is no obvious vegetation, however transaortic gradients across  the bioprosthetic valve are increased from the prior study on 01/16/2020,  mean gradient 28 mmHg, previously 16 mmHg. Consider TEE for further  evaluation.  2. Left ventricular ejection fraction, by estimation, is 55 to 60%. The  left ventricle has normal function. The left ventricle has no regional  wall motion abnormalities. There is mild concentric left ventricular  hypertrophy. Left ventricular diastolic  parameters are consistent with Grade I diastolic dysfunction (impaired  relaxation). Elevated left atrial pressure.  3. Right ventricular systolic function is normal. The right ventricular  size is normal.  4. Left atrial size was mildly dilated.  5. The mitral valve is normal in structure. Mild mitral valve  regurgitation. No evidence of mitral stenosis.  6. The aortic valve has been repaired/replaced. Aortic valve  regurgitation is not visualized. No aortic stenosis is present. There is a  23 mm Edwards-SAPIEN valve present in the aortic position. Aortic valve  mean gradient measures 28.0 mmHg.  7. The inferior vena cava is normal in size with greater than 50%  respiratory variability, suggesting right atrial pressure of 3 mmHg.   Patient Profile     69 y.o. male with a history of CAD s/p CABG in 2017 with most recent cath in 01/2020 showing 2/3 grafts occluded, aortic stenosis s/p AVR at time of CABG, paroxysmal atrial fibrillation on Eliquis, hypertension, dyslipidemia, diabetes  mellitus, recent cervical discectomy who is currently in CIR following prolonged hospitalization for sepsis. Cardiology consulted today for the evaluation of atrial flutter with RVR   Assessment & Plan    Atrial flutter with RVR Hx of PAF Chronic anticoagulation - held for joint aspiration - clinically tachcyardic - plan for TEE-guided DCCV on Monday - may be difficult to rate control given his ongoing infection - continue PO amiodarone load  Fever - will continue on ceftriaxone plus ampicillin - has upcoming TEE for cardioversion, will ask Dr Marlou Porch if  can quickly look again at valves to ensure no vegetation. - ID follows  CAD s/p CABG - cath 01/2020 with 2/3 grafts occluded with patent LIMA-LAD - no ASA with eliquis - zetia on hold for LFTs  Aortic stenosis s/p AVR 2017 - good valve structure and function on recent TEE for bacteremia  Hypertension - home amlodipine on hold for marginal pressures   For questions or updates, please contact [facility] Please consult [email or web address] for contact info under        Signed, Ena Dawley, MD  04/12/2020, 11:20 AM    Personally seen and examined. Agree with above.  Still in atrial flutter 138.  Symptomatic.  Feels his palpitations.  Feels worn out.  He is alert and oriented with regular tachycardia.  He is currently getting amiodarone loaded with 400 twice a day.  Not using IV amiodarone since he is in rehab.  Plan is for TEE cardioversion on Monday.  Hopefully he will stay in normal rhythm given his underlying discitis/knee infection.  No evidence of angina with his coronary disease.  He continues with Eliquis.  Reason for TEE is that he has been off and on his Eliquis for back procedure.  Prior aortic valve replacement 2017.  TEE will also give [place] another opportunity to look for any signs of endocarditis.  Ena Dawley, MD

## 2020-04-12 NOTE — Progress Notes (Signed)
[facility] PHYSICAL MEDICINE & REHABILITATION PROGRESS NOTE   Subjective/Complaints:   Pt's heart rate went back last night to 130s again - Afib with RVR- also had Tm of 100.5 last night- afebrile this AM.   Says didn't sleep much with vitals so frequently-  ID and Cards have seen pt today- has Cardioversion and TEE tomorrow- Lumbar aspirate is reincubating per ID note.     ROS:  Pt denies SOB, abd pain, CP, N/V/C/D, and vision changes   Objective:   No results found. No results for input(s): WBC, HGB, HCT, PLT in the last 72 hours. No results for input(s): NA, K, CL, CO2, GLUCOSE, BUN, CREATININE, CALCIUM in the last 72 hours.  Intake/Output Summary (Last 24 hours) at 04/12/2020 1236 Last data filed at 04/12/2020 0700 Gross per 24 hour  Intake 560 ml  Output 2225 ml  Net -1665 ml     Physical Exam: Vital Signs Blood pressure 118/80, pulse (!) 121, temperature 98.4 F (36.9 C), resp. rate 18, height 5\' 10"  (1.778 m), weight 75.2 kg, SpO2 99 %.  Physical Exam Constitutional:pt alone; laying in bed supine; appropriate, sleepy, NAD HENT:  Conjugate gaze Cardiovascular:Afib with RVR- rate 120s-130s Respiratory: CTA B/L- no W/R/R- good air movement [id number], NT, ND, (+)BS  Musculoskeletal:  Cervical back: Normal range of motion.  Comments: Both knees still swollen and somewhat tender- L knee still swollen, however still less TTP- suprapatella bursitis noted-  TTP over low back mainly in midline Neurological: Ox3 UE motor 5/5. LE 3-/5 prox (pain inhibition from back/knees) to 4/5 with ADF/PF. No focal sensory loss. DTR's 1+ Skin: Skin iswarm. He isnot diaphoretic.  Psychiatric:sleepy    Assessment/Plan: 1. Functional deficits secondary to Discitis/Osteomyelitis  which require 3+ hours per day of interdisciplinary therapy in a comprehensive inpatient rehab setting.  Physiatrist is providing close team supervision and 24 hour management of active  medical problems listed below.  Physiatrist and rehab team continue to assess barriers to discharge/monitor patient progress toward functional and medical goals  Care Tool:  Bathing    Body parts bathed by patient: Right arm, Left arm, Chest, Abdomen, Front perineal area, Right upper leg, Left upper leg, Face   Body parts bathed by helper: Right lower leg, Buttocks, Left lower leg     Bathing assist Assist Level: Moderate Assistance - Patient 50 - 74%     Upper Body Dressing/Undressing Upper body dressing Upper body dressing/undressing activity did not occur (including orthotics): N/A What is the patient wearing?: Pull over shirt    Upper body assist Assist Level: Minimal Assistance - Patient > 75%    Lower Body Dressing/Undressing Lower body dressing    Lower body dressing activity did not occur: N/A What is the patient wearing?: Pants     Lower body assist Assist for lower body dressing: Total Assistance - Patient < 25%     Toileting Toileting    Toileting assist Assist for toileting: Maximal Assistance - Patient 25 - 49%     Transfers Chair/bed transfer  Transfers assist  Chair/bed transfer activity did not occur: N/A  Chair/bed transfer assist level: Moderate Assistance - Patient 50 - 74%(scoot pivot)     Locomotion Ambulation   Ambulation assist      Assist level: Moderate Assistance - Patient 50 - 74% Assistive device: Walker-rolling Max distance: 5'   Walk 10 feet activity   Assist  Walk 10 feet activity did not occur: Safety/medical concerns        Walk  50 feet activity   Assist Walk 50 feet with 2 turns activity did not occur: Safety/medical concerns         Walk 150 feet activity   Assist Walk 150 feet activity did not occur: Safety/medical concerns         Walk 10 feet on uneven surface  activity   Assist Walk 10 feet on uneven surfaces activity did not occur: Safety/medical concerns          Wheelchair     Assist Will patient use wheelchair at discharge?: Yes(pending progress and pain management) Type of Wheelchair: Manual    Wheelchair assist level: Supervision/Verbal cueing Max wheelchair distance: 150'    Wheelchair 50 feet with 2 turns activity    Assist        Assist Level: Supervision/Verbal cueing   Wheelchair 150 feet activity     Assist      Assist Level: Supervision/Verbal cueing   Blood pressure 118/80, pulse (!) 121, temperature 98.4 F (36.9 C), resp. rate 18, height 5\' 10"  (1.778 m), weight 75.2 kg, SpO2 99 %.  Medical Problem List and Plan: 1.Decreased functional mobilitysecondary to sepsis/Enterococcusfaecalisbacteremia with septic knees and lumbar discitis/osteomyelitis -patient may shower -ELOS/Goals: 12-14 days, supervision to mod I 2. Antithrombotics: -DVT/anticoagulation:Eliquis 5mg  BID -antiplatelet therapy: N/A 3. Pain Management:Flexeril as needed, oxycodone as needed -heat, kpad as appropriate, lumbar corset, ice for knees. -ROM, core strengthening with therapies  5/20- added Oxycontin 10 mg BID, increased Lyrica to 100 mg BID and changed Flexeril to 5 mg TID prn for muscle spasms, since so sedating.   5/21- pain better controlled  5/22- sounds like got behind on pain meds yesterday- will increase oxycodone to 10-15 mg Q4 hours prn- explained could make more sleepy- increased lyrica to 100 mg TID 4. Mood:Trazodone as needed -antipsychotic agents: N/C 5. Neuropsych: This patientiscapable of making decisions on hisown behalf. 6. Skin/Wound Care:Routine skin checks 7. Fluids/Electrolytes/Nutrition:Routine in and outs with follow-up chemistries 8. Acute osteomyelitis/discitis L4-5. Neurosurgery follow-up no surgical intervention.Lumbar corsetfor comfort.  -S/p FNA biopsy L4-5 disc today.Await aspiration results  5/20-  ID put pt also on Rocephin as well   9.Recent C3-4 ACDF 03/02/2020 per [name] for cervical myelopathy. Cervical collar has been discontinued. 10. Septic knee. Status post aspiration and steroid injection with I&D of bilateral knees 03/24/2020. Follow-up orthopedic services  5/20- they attempted another aspiration- not real effective per pt- still has suprapatellar bursitis type swelling  5/22- Cx' no growth so far.  11. ID/Enterococcus bacteremia. Continue intravenous ampicillin through 05/20/2020. Follow-up infectious disease  5/20- on Ampicillin AND now Rocephin til 05/20/20  5/21- back Cx begative/not final  5/22- no change per ID note-   5/23- Had Tm of 100.5 last night- MEWS of 5 as a result with Afib- U/A and Cx sent- U/A is negative- plan for TEE looking for vegetations tomorrow with cardioversion- back Cx reincubating. 12. PAF/bioprosthetic aortic valve replacement 2017. Await plan to resume Eliquis. Amiodarone currently on hold  5/19- Back in Afib with RVR- called Cards to see if can restart Amiodarone.   5/20- restarted Amio at 400 mg BID- rate still 130s-140  5/22- rate much lower- was 30s-40-s yesterday- however in 80s-90s rate today- changed amio to Qday but also paged Cards.  5/23- changed back to 400 mg BID since rate back up  13. AKI. Follow-up chemistries 14. Diabetes mellitus. Hemoglobin A1c 6.8. Currently on NovoLog 3 units 3 times daily as well as Lantus insulin 10 units twice daily.  Patient on Glucophage prior to admission 500 mg nightly. Currently on hold due to AKI. Titrate regime to effect CBG (last 3)  Recent Labs    04/11/20 2130 04/12/20 0630 04/12/20 1157  GLUCAP 136* 150* 139*    5/23- BGs well controlled- con't meds 15. Acute on chronic anemia. Continue Niferex. Follow-up CBC 16. Hypothyroidism. Synthroid 17. Nerve pain in RLE- will add Lyrica 50 mg BID and monitor   5/20- increased to 100 mg BID  5/22- increased lyrica to 100 mg  TID 18, Insomnia  5/23- change trazodone to 75 mg QHS scheduled   LOS: 5 days A FACE TO FACE EVALUATION WAS PERFORMED  Johnathan Murray 04/12/2020, 12:36 PM

## 2020-04-12 NOTE — Progress Notes (Signed)
   04/12/20 1043  Assess: MEWS Score  Temp 98.4 F (36.9 C)  BP 118/80  Pulse Rate (!) 121  Resp 18  Level of Consciousness Alert  SpO2 99 %  O2 Device Room Air  Assess: MEWS Score  MEWS Temp 0  MEWS Systolic 0  MEWS Pulse 2  MEWS RR 0  MEWS LOC 0  MEWS Score 2  MEWS Score Color Yellow  Assess: if the MEWS score is Yellow or Red  Were vital signs taken at a resting state? Yes  Focused Assessment Documented focused assessment  Early Detection of Sepsis Score *See Row Information* Medium  MEWS guidelines implemented *See Row Information* Yes  Treat  MEWS Interventions Other (Comment)  Take Vital Signs  Increase Vital Sign Frequency  Yellow: Q 2hr X 2 then Q 4hr X 2, if remains yellow, continue Q 4hrs  Escalate  MEWS: Escalate Yellow: discuss with charge nurse/RN and consider discussing with provider and RRT  Notify: Charge Nurse/RN  Name of Charge Nurse/RN Notified ashley rn  Notify: Provider  Provider Name/Title dr. Dagoberto Ligas  Date Provider Notified 04/12/20  Time Provider Notified 1047  Notification Type Rounds  Response No new orders  Date of Provider Response 04/12/20  Time of Provider Response [id number]  Document  Patient Outcome Other (Comment)  Progress note created (see row info) Yes

## 2020-04-13 ENCOUNTER — Telehealth: Payer: Self-pay

## 2020-04-13 ENCOUNTER — Inpatient Hospital Stay (HOSPITAL_COMMUNITY): Payer: BC Managed Care – PPO | Admitting: Anesthesiology

## 2020-04-13 ENCOUNTER — Inpatient Hospital Stay (HOSPITAL_COMMUNITY): Payer: BC Managed Care – PPO

## 2020-04-13 ENCOUNTER — Encounter (HOSPITAL_COMMUNITY): Payer: Self-pay | Admitting: Physical Medicine and Rehabilitation

## 2020-04-13 ENCOUNTER — Encounter (HOSPITAL_COMMUNITY)
Admission: RE | Disposition: A | Discharge status: Other Acute Inpatient Hospital | Payer: Self-pay | Source: Intra-hospital | Attending: Physical Medicine and Rehabilitation

## 2020-04-13 ENCOUNTER — Inpatient Hospital Stay (HOSPITAL_COMMUNITY): Payer: BC Managed Care – PPO | Admitting: Occupational Therapy

## 2020-04-13 ENCOUNTER — Inpatient Hospital Stay (HOSPITAL_COMMUNITY)
Admission: AD | Admit: 2020-04-13 | Discharge: 2020-04-30 | DRG: 219 | Disposition: A | Discharge status: Rehabilitation Facility | Payer: BC Managed Care – PPO | Source: Ambulatory Visit | Attending: Surgery | Admitting: Surgery

## 2020-04-13 ENCOUNTER — Encounter (HOSPITAL_COMMUNITY): Payer: BC Managed Care – PPO | Admitting: Psychology

## 2020-04-13 DIAGNOSIS — E039 Hypothyroidism, unspecified: Secondary | ICD-10-CM | POA: Diagnosis not present

## 2020-04-13 DIAGNOSIS — I482 Chronic atrial fibrillation, unspecified: Secondary | ICD-10-CM | POA: Diagnosis not present

## 2020-04-13 DIAGNOSIS — M23352 Other meniscus derangements, posterior horn of lateral meniscus, left knee: Secondary | ICD-10-CM | POA: Diagnosis not present

## 2020-04-13 DIAGNOSIS — Z01810 Encounter for preprocedural cardiovascular examination: Secondary | ICD-10-CM | POA: Diagnosis not present

## 2020-04-13 DIAGNOSIS — J90 Pleural effusion, not elsewhere classified: Secondary | ICD-10-CM | POA: Diagnosis not present

## 2020-04-13 DIAGNOSIS — D62 Acute posthemorrhagic anemia: Secondary | ICD-10-CM | POA: Diagnosis not present

## 2020-04-13 DIAGNOSIS — B952 Enterococcus as the cause of diseases classified elsewhere: Secondary | ICD-10-CM | POA: Diagnosis not present

## 2020-04-13 DIAGNOSIS — I4891 Unspecified atrial fibrillation: Secondary | ICD-10-CM

## 2020-04-13 DIAGNOSIS — H9012 Conductive hearing loss, unilateral, left ear, with unrestricted hearing on the contralateral side: Secondary | ICD-10-CM | POA: Diagnosis not present

## 2020-04-13 DIAGNOSIS — I2510 Atherosclerotic heart disease of native coronary artery without angina pectoris: Secondary | ICD-10-CM | POA: Diagnosis not present

## 2020-04-13 DIAGNOSIS — Z8249 Family history of ischemic heart disease and other diseases of the circulatory system: Secondary | ICD-10-CM

## 2020-04-13 DIAGNOSIS — Q676 Pectus excavatum: Secondary | ICD-10-CM

## 2020-04-13 DIAGNOSIS — K219 Gastro-esophageal reflux disease without esophagitis: Secondary | ICD-10-CM | POA: Diagnosis present

## 2020-04-13 DIAGNOSIS — I330 Acute and subacute infective endocarditis: Secondary | ICD-10-CM | POA: Diagnosis present

## 2020-04-13 DIAGNOSIS — Y831 Surgical operation with implant of artificial internal device as the cause of abnormal reaction of the patient, or of later complication, without mention of misadventure at the time of the procedure: Secondary | ICD-10-CM | POA: Diagnosis present

## 2020-04-13 DIAGNOSIS — I440 Atrioventricular block, first degree: Secondary | ICD-10-CM | POA: Diagnosis not present

## 2020-04-13 DIAGNOSIS — N183 Chronic kidney disease, stage 3 unspecified: Secondary | ICD-10-CM | POA: Diagnosis not present

## 2020-04-13 DIAGNOSIS — I25118 Atherosclerotic heart disease of native coronary artery with other forms of angina pectoris: Secondary | ICD-10-CM | POA: Diagnosis present

## 2020-04-13 DIAGNOSIS — E1169 Type 2 diabetes mellitus with other specified complication: Secondary | ICD-10-CM | POA: Diagnosis present

## 2020-04-13 DIAGNOSIS — I10 Essential (primary) hypertension: Secondary | ICD-10-CM | POA: Diagnosis not present

## 2020-04-13 DIAGNOSIS — M4626 Osteomyelitis of vertebra, lumbar region: Secondary | ICD-10-CM | POA: Diagnosis not present

## 2020-04-13 DIAGNOSIS — I712 Thoracic aortic aneurysm, without rupture: Secondary | ICD-10-CM | POA: Diagnosis not present

## 2020-04-13 DIAGNOSIS — E785 Hyperlipidemia, unspecified: Secondary | ICD-10-CM | POA: Diagnosis present

## 2020-04-13 DIAGNOSIS — J9811 Atelectasis: Secondary | ICD-10-CM | POA: Diagnosis not present

## 2020-04-13 DIAGNOSIS — M4640 Discitis, unspecified, site unspecified: Secondary | ICD-10-CM | POA: Diagnosis not present

## 2020-04-13 DIAGNOSIS — E119 Type 2 diabetes mellitus without complications: Secondary | ICD-10-CM | POA: Diagnosis not present

## 2020-04-13 DIAGNOSIS — I4819 Other persistent atrial fibrillation: Secondary | ICD-10-CM | POA: Diagnosis not present

## 2020-04-13 DIAGNOSIS — Z954 Presence of other heart-valve replacement: Secondary | ICD-10-CM | POA: Diagnosis not present

## 2020-04-13 DIAGNOSIS — I340 Nonrheumatic mitral (valve) insufficiency: Secondary | ICD-10-CM | POA: Diagnosis not present

## 2020-04-13 DIAGNOSIS — N179 Acute kidney failure, unspecified: Secondary | ICD-10-CM | POA: Diagnosis not present

## 2020-04-13 DIAGNOSIS — M00862 Arthritis due to other bacteria, left knee: Secondary | ICD-10-CM | POA: Diagnosis not present

## 2020-04-13 DIAGNOSIS — I719 Aortic aneurysm of unspecified site, without rupture: Secondary | ICD-10-CM | POA: Diagnosis not present

## 2020-04-13 DIAGNOSIS — M009 Pyogenic arthritis, unspecified: Secondary | ICD-10-CM | POA: Diagnosis present

## 2020-04-13 DIAGNOSIS — K6812 Psoas muscle abscess: Secondary | ICD-10-CM | POA: Diagnosis present

## 2020-04-13 DIAGNOSIS — I483 Typical atrial flutter: Secondary | ICD-10-CM

## 2020-04-13 DIAGNOSIS — I339 Acute and subacute endocarditis, unspecified: Secondary | ICD-10-CM | POA: Diagnosis not present

## 2020-04-13 DIAGNOSIS — M25562 Pain in left knee: Secondary | ICD-10-CM | POA: Diagnosis not present

## 2020-04-13 DIAGNOSIS — J939 Pneumothorax, unspecified: Secondary | ICD-10-CM

## 2020-04-13 DIAGNOSIS — R5381 Other malaise: Secondary | ICD-10-CM | POA: Diagnosis not present

## 2020-04-13 DIAGNOSIS — D638 Anemia in other chronic diseases classified elsewhere: Secondary | ICD-10-CM | POA: Diagnosis present

## 2020-04-13 DIAGNOSIS — K5900 Constipation, unspecified: Secondary | ICD-10-CM | POA: Diagnosis not present

## 2020-04-13 DIAGNOSIS — I350 Nonrheumatic aortic (valve) stenosis: Secondary | ICD-10-CM | POA: Diagnosis not present

## 2020-04-13 DIAGNOSIS — R7881 Bacteremia: Secondary | ICD-10-CM

## 2020-04-13 DIAGNOSIS — I38 Endocarditis, valve unspecified: Secondary | ICD-10-CM | POA: Diagnosis not present

## 2020-04-13 DIAGNOSIS — M4646 Discitis, unspecified, lumbar region: Secondary | ICD-10-CM | POA: Diagnosis not present

## 2020-04-13 DIAGNOSIS — T826XXA Infection and inflammatory reaction due to cardiac valve prosthesis, initial encounter: Principal | ICD-10-CM | POA: Diagnosis present

## 2020-04-13 DIAGNOSIS — Z794 Long term (current) use of insulin: Secondary | ICD-10-CM | POA: Diagnosis not present

## 2020-04-13 DIAGNOSIS — M1711 Unilateral primary osteoarthritis, right knee: Secondary | ICD-10-CM | POA: Diagnosis not present

## 2020-04-13 DIAGNOSIS — I4892 Unspecified atrial flutter: Secondary | ICD-10-CM | POA: Diagnosis not present

## 2020-04-13 DIAGNOSIS — I480 Paroxysmal atrial fibrillation: Secondary | ICD-10-CM | POA: Diagnosis present

## 2020-04-13 DIAGNOSIS — I129 Hypertensive chronic kidney disease with stage 1 through stage 4 chronic kidney disease, or unspecified chronic kidney disease: Secondary | ICD-10-CM | POA: Diagnosis present

## 2020-04-13 DIAGNOSIS — M23222 Derangement of posterior horn of medial meniscus due to old tear or injury, left knee: Secondary | ICD-10-CM | POA: Diagnosis not present

## 2020-04-13 DIAGNOSIS — E43 Unspecified severe protein-calorie malnutrition: Secondary | ICD-10-CM | POA: Diagnosis not present

## 2020-04-13 DIAGNOSIS — M25561 Pain in right knee: Secondary | ICD-10-CM | POA: Diagnosis not present

## 2020-04-13 DIAGNOSIS — Z981 Arthrodesis status: Secondary | ICD-10-CM | POA: Diagnosis not present

## 2020-04-13 DIAGNOSIS — M00869 Arthritis due to other bacteria, unspecified knee: Secondary | ICD-10-CM | POA: Diagnosis not present

## 2020-04-13 DIAGNOSIS — A4181 Sepsis due to Enterococcus: Secondary | ICD-10-CM | POA: Diagnosis not present

## 2020-04-13 DIAGNOSIS — G061 Intraspinal abscess and granuloma: Secondary | ICD-10-CM | POA: Diagnosis not present

## 2020-04-13 DIAGNOSIS — Z419 Encounter for procedure for purposes other than remedying health state, unspecified: Secondary | ICD-10-CM

## 2020-04-13 DIAGNOSIS — E1165 Type 2 diabetes mellitus with hyperglycemia: Secondary | ICD-10-CM | POA: Diagnosis present

## 2020-04-13 DIAGNOSIS — Z952 Presence of prosthetic heart valve: Secondary | ICD-10-CM

## 2020-04-13 DIAGNOSIS — M00262 Other streptococcal arthritis, left knee: Secondary | ICD-10-CM | POA: Diagnosis not present

## 2020-04-13 DIAGNOSIS — Z888 Allergy status to other drugs, medicaments and biological substances status: Secondary | ICD-10-CM

## 2020-04-13 DIAGNOSIS — M11262 Other chondrocalcinosis, left knee: Secondary | ICD-10-CM | POA: Diagnosis not present

## 2020-04-13 DIAGNOSIS — Z7901 Long term (current) use of anticoagulants: Secondary | ICD-10-CM

## 2020-04-13 DIAGNOSIS — I358 Other nonrheumatic aortic valve disorders: Secondary | ICD-10-CM | POA: Diagnosis not present

## 2020-04-13 DIAGNOSIS — E8770 Fluid overload, unspecified: Secondary | ICD-10-CM | POA: Diagnosis not present

## 2020-04-13 DIAGNOSIS — M25461 Effusion, right knee: Secondary | ICD-10-CM | POA: Diagnosis not present

## 2020-04-13 DIAGNOSIS — I361 Nonrheumatic tricuspid (valve) insufficiency: Secondary | ICD-10-CM | POA: Diagnosis not present

## 2020-04-13 DIAGNOSIS — H3530 Unspecified macular degeneration: Secondary | ICD-10-CM | POA: Diagnosis present

## 2020-04-13 DIAGNOSIS — E11649 Type 2 diabetes mellitus with hypoglycemia without coma: Secondary | ICD-10-CM | POA: Diagnosis not present

## 2020-04-13 DIAGNOSIS — Z85828 Personal history of other malignant neoplasm of skin: Secondary | ICD-10-CM

## 2020-04-13 DIAGNOSIS — Z79890 Hormone replacement therapy (postmenopausal): Secondary | ICD-10-CM

## 2020-04-13 DIAGNOSIS — M4647 Discitis, unspecified, lumbosacral region: Secondary | ICD-10-CM | POA: Diagnosis not present

## 2020-04-13 DIAGNOSIS — Z951 Presence of aortocoronary bypass graft: Secondary | ICD-10-CM

## 2020-04-13 DIAGNOSIS — Z01818 Encounter for other preprocedural examination: Secondary | ICD-10-CM | POA: Diagnosis not present

## 2020-04-13 DIAGNOSIS — M00861 Arthritis due to other bacteria, right knee: Secondary | ICD-10-CM | POA: Diagnosis not present

## 2020-04-13 HISTORY — PX: CARDIOVERSION: SHX1299

## 2020-04-13 HISTORY — DX: Typical atrial flutter: I48.3

## 2020-04-13 HISTORY — PX: TEE WITHOUT CARDIOVERSION: SHX5443

## 2020-04-13 HISTORY — DX: Acute and subacute infective endocarditis: I33.0

## 2020-04-13 HISTORY — DX: Endocarditis, valve unspecified: I38

## 2020-04-13 HISTORY — DX: Infection and inflammatory reaction due to cardiac valve prosthesis, initial encounter: T82.6XXA

## 2020-04-13 LAB — BASIC METABOLIC PANEL
Anion gap: 12 (ref 5–15)
BUN: 21 mg/dL (ref 8–23)
CO2: 26 mmol/L (ref 22–32)
Calcium: 8.8 mg/dL — ABNORMAL LOW (ref 8.9–10.3)
Chloride: 101 mmol/L (ref 98–111)
Creatinine, Ser: 1.43 mg/dL — ABNORMAL HIGH (ref 0.61–1.24)
GFR calc Af Amer: 58 mL/min — ABNORMAL LOW (ref >60)
GFR calc non Af Amer: 50 mL/min — ABNORMAL LOW (ref >60)
Glucose, Bld: 168 mg/dL — ABNORMAL HIGH (ref 70–99)
Potassium: 3.9 mmol/L (ref 3.5–5.1)
Sodium: 139 mmol/L (ref 135–145)

## 2020-04-13 LAB — CBC WITH DIFFERENTIAL/PLATELET
Abs Immature Granulocytes: 0.06 10*3/uL (ref 0.00–0.07)
Basophils Absolute: 0 10*3/uL (ref 0.0–0.1)
Basophils Relative: 1 %
Eosinophils Absolute: 0.2 10*3/uL (ref 0.0–0.5)
Eosinophils Relative: 2 %
HCT: 24 % — ABNORMAL LOW (ref 39.0–52.0)
Hemoglobin: 7.5 g/dL — ABNORMAL LOW (ref 13.0–17.0)
Immature Granulocytes: 1 %
Lymphocytes Relative: 16 %
Lymphs Abs: 1.2 10*3/uL (ref 0.7–4.0)
MCH: 29.4 pg (ref 26.0–34.0)
MCHC: 31.3 g/dL (ref 30.0–36.0)
MCV: 94.1 fL (ref 80.0–100.0)
Monocytes Absolute: 0.5 10*3/uL (ref 0.1–1.0)
Monocytes Relative: 6 %
Neutro Abs: 5.9 10*3/uL (ref 1.7–7.7)
Neutrophils Relative %: 74 %
Platelets: 383 10*3/uL (ref 150–400)
RBC: 2.55 MIL/uL — ABNORMAL LOW (ref 4.22–5.81)
RDW: 15.9 % — ABNORMAL HIGH (ref 11.5–15.5)
WBC: 7.9 10*3/uL (ref 4.0–10.5)
nRBC: 0 % (ref 0.0–0.2)

## 2020-04-13 LAB — URINE CULTURE: Culture: NO GROWTH

## 2020-04-13 LAB — GLUCOSE, CAPILLARY
Glucose-Capillary: 151 mg/dL — ABNORMAL HIGH (ref 70–99)
Glucose-Capillary: 72 mg/dL (ref 70–99)
Glucose-Capillary: 165 mg/dL — ABNORMAL HIGH (ref 70–99)
Glucose-Capillary: 85 mg/dL (ref 70–99)
Glucose-Capillary: 119 mg/dL — ABNORMAL HIGH (ref 70–99)

## 2020-04-13 LAB — AEROBIC/ANAEROBIC CULTURE W GRAM STAIN (SURGICAL/DEEP WOUND): Special Requests: NORMAL

## 2020-04-13 LAB — APTT: aPTT: 43 seconds — ABNORMAL HIGH (ref 24–36)

## 2020-04-13 LAB — HEPARIN LEVEL (UNFRACTIONATED): Heparin Unfractionated: >2.2 IU/mL — ABNORMAL HIGH (ref 0.30–0.70)

## 2020-04-13 SURGERY — ECHOCARDIOGRAM, TRANSESOPHAGEAL
Anesthesia: General

## 2020-04-13 MED ORDER — DOCUSATE SODIUM 100 MG PO CAPS
100.0000 mg | ORAL_CAPSULE | Freq: Two times a day (BID) | ORAL | Status: DC
  Administered 2020-04-13 – 2020-04-15 (×5): 100 mg via ORAL
  Filled 2020-04-13 (×5): qty 1

## 2020-04-13 MED ORDER — CYCLOBENZAPRINE HCL 10 MG PO TABS
5.0000 mg | ORAL_TABLET | Freq: Three times a day (TID) | ORAL | Status: DC | PRN
  Administered 2020-04-14 – 2020-04-24 (×6): 5 mg via ORAL
  Filled 2020-04-13 (×8): qty 1

## 2020-04-13 MED ORDER — INSULIN ASPART 100 UNIT/ML ~~LOC~~ SOLN
3.0000 [IU] | Freq: Three times a day (TID) | SUBCUTANEOUS | Status: DC
  Administered 2020-04-14 – 2020-04-15 (×6): 3 [IU] via SUBCUTANEOUS

## 2020-04-13 MED ORDER — SENNA 8.6 MG PO TABS
1.0000 | ORAL_TABLET | Freq: Every day | ORAL | Status: DC
  Administered 2020-04-13 – 2020-04-14 (×2): 8.6 mg via ORAL
  Filled 2020-04-13 (×2): qty 1

## 2020-04-13 MED ORDER — SODIUM CHLORIDE 0.9 % IV SOLN: 2.0000 g | Freq: Two times a day (BID) | INTRAVENOUS | Status: DC

## 2020-04-13 MED ORDER — PREGABALIN 100 MG PO CAPS: 100.0000 mg | ORAL_CAPSULE | Freq: Three times a day (TID) | ORAL | Status: DC

## 2020-04-13 MED ORDER — SODIUM CHLORIDE 0.9 % IV SOLN
2.0000 g | Freq: Two times a day (BID) | INTRAVENOUS | Status: DC
  Administered 2020-04-14 – 2020-04-30 (×32): 2 g via INTRAVENOUS
  Filled 2020-04-13 (×5): qty 2
  Filled 2020-04-13: qty 20
  Filled 2020-04-13 (×8): qty 2
  Filled 2020-04-13: qty 20
  Filled 2020-04-13 (×14): qty 2
  Filled 2020-04-13 (×2): qty 20
  Filled 2020-04-13 (×2): qty 2
  Filled 2020-04-13 (×2): qty 20

## 2020-04-13 MED ORDER — INSULIN GLARGINE 100 UNIT/ML ~~LOC~~ SOLN: 10.0000 [IU] | Freq: Two times a day (BID) | SUBCUTANEOUS | 11 refills | Status: DC

## 2020-04-13 MED ORDER — PREGABALIN 100 MG PO CAPS
100.0000 mg | ORAL_CAPSULE | Freq: Three times a day (TID) | ORAL | Status: DC
  Administered 2020-04-13 – 2020-04-30 (×48): 100 mg via ORAL
  Filled 2020-04-13 (×49): qty 1

## 2020-04-13 MED ORDER — OXYCODONE HCL ER 10 MG PO T12A
10.0000 mg | EXTENDED_RELEASE_TABLET | Freq: Two times a day (BID) | ORAL | Status: DC
  Administered 2020-04-13 – 2020-04-30 (×29): 10 mg via ORAL
  Filled 2020-04-13 (×32): qty 1

## 2020-04-13 MED ORDER — INSULIN ASPART 100 UNIT/ML ~~LOC~~ SOLN: 0.0000 [IU] | Freq: Three times a day (TID) | SUBCUTANEOUS | 11 refills | Status: DC

## 2020-04-13 MED ORDER — TRAZODONE HCL 50 MG PO TABS: 100.0000 mg | ORAL_TABLET | Freq: Every day | ORAL | Status: DC

## 2020-04-13 MED ORDER — SODIUM CHLORIDE 0.9 % IV SOLN
2.0000 g | Freq: Four times a day (QID) | INTRAVENOUS | Status: DC
  Administered 2020-04-13 – 2020-04-17 (×12): 2 g via INTRAVENOUS
  Filled 2020-04-13 (×2): qty 2
  Filled 2020-04-13: qty 2000
  Filled 2020-04-13 (×2): qty 2
  Filled 2020-04-13: qty 2000
  Filled 2020-04-13: qty 2
  Filled 2020-04-13 (×2): qty 2000
  Filled 2020-04-13 (×2): qty 2
  Filled 2020-04-13 (×3): qty 2000
  Filled 2020-04-13: qty 2

## 2020-04-13 MED ORDER — PROPOFOL 500 MG/50ML IV EMUL
INTRAVENOUS | Status: DC | PRN
  Administered 2020-04-13: 100 ug/kg/min via INTRAVENOUS

## 2020-04-13 MED ORDER — AMIODARONE HCL 400 MG PO TABS: 400.0000 mg | ORAL_TABLET | Freq: Two times a day (BID) | ORAL | Status: DC

## 2020-04-13 MED ORDER — PHENYLEPHRINE 40 MCG/ML (10ML) SYRINGE FOR IV PUSH (FOR BLOOD PRESSURE SUPPORT)
PREFILLED_SYRINGE | INTRAVENOUS | Status: DC | PRN
  Administered 2020-04-13: 200 ug via INTRAVENOUS
  Administered 2020-04-13: 160 ug via INTRAVENOUS
  Administered 2020-04-13 (×2): 200 ug via INTRAVENOUS
  Administered 2020-04-13 (×2): 160 ug via INTRAVENOUS
  Administered 2020-04-13: 240 ug via INTRAVENOUS
  Administered 2020-04-13: 160 ug via INTRAVENOUS

## 2020-04-13 MED ORDER — INSULIN GLARGINE 100 UNIT/ML ~~LOC~~ SOLN
10.0000 [IU] | Freq: Two times a day (BID) | SUBCUTANEOUS | Status: DC
  Administered 2020-04-14 – 2020-04-15 (×4): 10 [IU] via SUBCUTANEOUS
  Filled 2020-04-13 (×6): qty 0.1

## 2020-04-13 MED ORDER — METOPROLOL TARTRATE 5 MG/5ML IV SOLN: 2.5000 mg | Freq: Four times a day (QID) | INTRAVENOUS | Status: DC | PRN

## 2020-04-13 MED ORDER — PROPOFOL 10 MG/ML IV BOLUS
INTRAVENOUS | Status: DC | PRN
  Administered 2020-04-13: 50 mg via INTRAVENOUS

## 2020-04-13 MED ORDER — INSULIN ASPART 100 UNIT/ML ~~LOC~~ SOLN: 3.0000 [IU] | Freq: Three times a day (TID) | SUBCUTANEOUS | 11 refills | Status: DC

## 2020-04-13 MED ORDER — LIDOCAINE VISCOUS HCL 2 % MT SOLN
OROMUCOSAL | Status: AC
  Filled 2020-04-13: qty 15

## 2020-04-13 MED ORDER — LIDOCAINE 2% (20 MG/ML) 5 ML SYRINGE
INTRAMUSCULAR | Status: DC | PRN
  Administered 2020-04-13: 50 mg via INTRAVENOUS

## 2020-04-13 MED ORDER — HEPARIN (PORCINE) 25000 UT/250ML-% IV SOLN
1450.0000 [IU]/h | INTRAVENOUS | Status: DC
  Administered 2020-04-13: 1100 [IU]/h via INTRAVENOUS
  Administered 2020-04-14: 1200 [IU]/h via INTRAVENOUS
  Administered 2020-04-15: 1350 [IU]/h via INTRAVENOUS
  Filled 2020-04-13 (×2): qty 250

## 2020-04-13 MED ORDER — LEVOTHYROXINE SODIUM 25 MCG PO TABS
25.0000 ug | ORAL_TABLET | Freq: Every day | ORAL | Status: DC
  Administered 2020-04-14 – 2020-04-30 (×17): 25 ug via ORAL
  Filled 2020-04-13 (×17): qty 1

## 2020-04-13 MED ORDER — INSULIN ASPART 100 UNIT/ML ~~LOC~~ SOLN
0.0000 [IU] | Freq: Three times a day (TID) | SUBCUTANEOUS | Status: DC
  Administered 2020-04-14 (×2): 2 [IU] via SUBCUTANEOUS
  Administered 2020-04-15: 1 [IU] via SUBCUTANEOUS
  Administered 2020-04-15: 2 [IU] via SUBCUTANEOUS

## 2020-04-13 MED ORDER — HEPARIN (PORCINE) 25000 UT/250ML-% IV SOLN
1050.0000 [IU]/h | INTRAVENOUS | Status: DC
  Filled 2020-04-13: qty 250

## 2020-04-13 MED ORDER — AMIODARONE HCL 200 MG PO TABS
400.0000 mg | ORAL_TABLET | Freq: Two times a day (BID) | ORAL | Status: DC
  Administered 2020-04-13 – 2020-04-16 (×6): 400 mg via ORAL
  Filled 2020-04-13 (×6): qty 2

## 2020-04-13 MED ORDER — ACETAMINOPHEN 325 MG PO TABS: 650.0000 mg | ORAL_TABLET | Freq: Four times a day (QID) | ORAL | Status: DC | PRN

## 2020-04-13 MED ORDER — ASCORBIC ACID 500 MG PO TABS
500.0000 mg | ORAL_TABLET | Freq: Every day | ORAL | Status: DC
  Administered 2020-04-14 – 2020-04-30 (×15): 500 mg via ORAL
  Filled 2020-04-13 (×15): qty 1

## 2020-04-13 MED ORDER — LIDOCAINE VISCOUS HCL 2 % MT SOLN
OROMUCOSAL | Status: DC | PRN
  Administered 2020-04-13: 10 mL via OROMUCOSAL

## 2020-04-13 MED ORDER — ACETAMINOPHEN 325 MG PO TABS
650.0000 mg | ORAL_TABLET | Freq: Four times a day (QID) | ORAL | Status: DC | PRN
  Administered 2020-04-14: 650 mg via ORAL
  Filled 2020-04-13: qty 2

## 2020-04-13 MED ORDER — OXYCODONE HCL ER 10 MG PO T12A: 10.0000 mg | EXTENDED_RELEASE_TABLET | Freq: Two times a day (BID) | ORAL | Status: DC

## 2020-04-13 MED ORDER — CYCLOBENZAPRINE HCL 5 MG PO TABS: 5.0000 mg | ORAL_TABLET | Freq: Three times a day (TID) | ORAL | 0 refills | Status: DC | PRN

## 2020-04-13 NOTE — Discharge Summary (Signed)
Physician Discharge Summary  Patient ID: Johnathan Murray MRN: [id number] DOB/AGE: 1951-03-06 69 y.o.  Admit date: 04/07/2020 Discharge date: 04/13/2020  Discharge Diagnoses:  Principal Problem:   Prosthetic valve endocarditis ([facility]) Active Problems:   Bacteremia due to Enterococcus   Discitis   Typical atrial flutter ([facility])   Aortic valve abscess Acute osteomyelitis discitis L4-5 Septic knee Recent ACDF C3-4 PAF/bioprosthetic aortic valve replacement Diabetes mellitus AKI Discharged Condition: Guarded  Significant Diagnostic Studies: CT ABDOMEN PELVIS WO CONTRAST  Result Date: 03/19/2020 CLINICAL DATA:  Generalized abdominal pain EXAM: CT ABDOMEN AND PELVIS WITHOUT CONTRAST TECHNIQUE: Multidetector CT imaging of the abdomen and pelvis was performed following the standard protocol without IV contrast. COMPARISON:  None. FINDINGS: Lower chest: Prosthetic aortic valve is seen. No pericardial fluid/thickening. No hiatal hernia. The visualized portions of the lungs are clear. Hepatobiliary: Although limited due to the lack of intravenous contrast, normal in appearance without gross focal abnormality. Scattered punctate calcifications are seen. No evidence of calcified gallstones or biliary ductal dilatation. Pancreas:  Unremarkable.  No surrounding inflammatory changes. Spleen: Normal in size. Although limited due to the lack of intravenous contrast, normal in appearance. Adrenals/Urinary Tract: Both adrenal glands appear normal. Mild bilateral perinephric stranding is seen, likely due to chronic medical renal disease. No renal or collecting system calculi. No hydronephrosis. Bladder is unremarkable. Stomach/Bowel: The stomach and small bowel are normal in appearance. There is a moderate amount of colonic stool. Scattered colonic diverticula are noted without diverticulitis. Vascular/Lymphatic: There are no enlarged abdominal or pelvic lymph nodes. Scattered aortic atherosclerotic calcifications  are seen without aneurysmal dilatation. Reproductive: The prostate is unremarkable. Other: No evidence of abdominal wall mass or hernia. Musculoskeletal: No acute or significant osseous findings. IMPRESSION: No renal or collecting system calculi. Colonic diverticulosis without diverticulitis. Moderate amount of colonic stool. Aortic Atherosclerosis (ICD10-I70.0). No other acute intra-abdominal or pelvic pathology to explain the patient's symptoms. Electronically Signed   By: Prudencio Pair M.D.   On: 03/19/2020 23:14   CT Cervical Spine Wo Contrast  Result Date: 03/19/2020 CLINICAL DATA:  Recent cervical surgery with neck pain EXAM: CT CERVICAL SPINE WITHOUT CONTRAST TECHNIQUE: Multidetector CT imaging of the cervical spine was performed without intravenous contrast. Multiplanar CT image reconstructions were also generated. COMPARISON:  CT 12/27/2019 FINDINGS: Alignment: Mild straightening. No subluxation. Facet alignment is maintained. Skull base and vertebrae: No acute fracture. No primary bone lesion or focal pathologic process. Soft tissues and spinal canal: No prevertebral fluid or swelling. No visible canal hematoma. Disc levels: Interval removal of fixating plate and screws from C4 through C6 with hardware tracks present in C5 and C6 vertebral bodies. Intervertebral disc devices are present at C4-C5 and C5-C6. Interval placement of anterior plate and fixating screws in addition to interbody device at C3-C4. There appears to be resection of the posteroinferior aspect of C3 and superior posterior aspect of C4. Cut margins appear smooth. Degenerative changes at C6-C7 and C7-T1. Upper chest: Carotid vascular calcifications. Lung apices are clear. Other: No gross retropharyngeal gas or fluid collection. IMPRESSION: 1. Interval removal of fixating plate and screws from C4 through C6 with interval placement of surgical plate, fixating screws and interbody device at C3-C4. No acute osseous abnormality.  Electronically Signed   By: Donavan Foil M.D.   On: 03/19/2020 23:19   MR CERVICAL SPINE WO CONTRAST  Result Date: 03/23/2020 CLINICAL DATA:  Enterococcal bacteremia and possible aortic valve endocarditis. History of prior cervical spine surgery, most recently 03/02/2020 when the patient underwent  C3-4 ACDF. Severe neck pain. EXAM: MRI CERVICAL SPINE WITHOUT CONTRAST TECHNIQUE: Multiplanar, multisequence MR imaging of the cervical spine was performed. No intravenous contrast was administered. COMPARISON:  Cervical spine CT scan 03/19/2020. MRI cervical spine 10/28/2017. Postmyelogram CT scan 12/27/2019. FINDINGS: Alignment: Maintained. Vertebrae: Again seen is postoperative change of C3-6 fusion. Hardware at C4-5 and C5-6 has been removed. Anterior plate and screws are in place at C3-4. No evidence of discitis or osteomyelitis. No fracture or worrisome lesion. Cord: Patient motion somewhat limits evaluation. There is a punctate focus of signal abnormality in the left hemicord at C4-5 compatible with myelomalacia. There may be mild signal abnormality within the cord at C3-4. No epidural abscess. Posterior Fossa, vertebral arteries, paraspinal tissues: Negative. Disc levels: C2-3: Shallow disc osteophyte complex and right worse than left facet arthropathy. There is some ligamentum flavum thickening. Mild central canal and mild to moderate right foraminal stenosis. Left foramen open. No change. C3-4: Status post discectomy and fusion since the prior postmyelogram CT. There is severe facet degenerative disease on the left with marrow edema in the facets. Cord flattening seen on the prior examination appears resolved. The foramina appear open. Motion degrades evaluation. C4-5: Status post discectomy and fusion.  No stenosis. C5-6: Status post discectomy and fusion.  No stenosis. C6-7: Minimal bulge without stenosis. C7-T1: Shallow disc osteophyte complex and uncovertebral disease. No stenosis. T1-2: Disc bulge  without stenosis. This level is imaged in the sagittal plane only. IMPRESSION: Negative for evidence of infection. Status post C3-4 discectomy and fusion. Central canal stenosis seen on the preoperative examination is improved. Marked facet degenerative change on the left with associated marrow edema noted. Although motion somewhat limits evaluation, there appears to be a small focus of myelomalacia in the left cord at C4-5. Electronically Signed   By: Inge Rise M.D.   On: 03/23/2020 11:59   MR LUMBAR SPINE WO CONTRAST  Result Date: 04/02/2020 CLINICAL DATA:  For comparison initial evaluation for acute low back pain, recent surgery, infection suspected. EXAM: MRI LUMBAR SPINE WITHOUT CONTRAST TECHNIQUE: Multiplanar, multisequence MR imaging of the lumbar spine was performed. No intravenous contrast was administered. COMPARISON:  None. FINDINGS: Segmentation: Standard. Lowest well-formed disc space labeled the L5-S1 level. Alignment: Physiologic with preservation of the normal lumbar lordosis. No listhesis. Vertebrae: Abnormal T2/STIR signal intensity seen throughout the L4-5 interspace with associated progressive disc space height loss as compared to previous. Associated abnormal marrow edema with mild endplate irregularity within the adjacent L4 and L5 vertebral bodies. Findings consistent with acute osteomyelitis discitis. Irregular heterogeneous T1/T2 signal intensity seen within the ventral epidural space at this level could reflect postoperative changes and/or epidural phlegmon/abscess (series 2, image 8). Mild reactive marrow edema with small joint effusion seen about the left greater than right L4-5 facets as well, which could also be related to infection and/or arthritic changes. No other evidence for acute infection within the lumbar spine. No evidence for acute or chronic fracture. Underlying bone marrow signal intensity mildly heterogeneous but within normal limits. No worrisome osseous  lesions. Conus medullaris and cauda equina: Conus extends to the L1 level. Conus and cauda equina appear normal. Paraspinal and other soft tissues: Soft tissue edema seen throughout the lower posterior paraspinous soft tissues, which could be related to postoperative changes and/or infection/myositis. Abnormal edema seen within the psoas musculature bilaterally as well, likely related to infection. Few small superimposed soft tissue collections present within both psoas muscles, largest of which measures approximately 11 mm on the right (  series 5, image 29), consistent with small abscesses. Few small cyst noted within the kidneys bilaterally. Visualized visceral structures otherwise unremarkable. Disc levels: L1-2: Negative interspace. Mild facet hypertrophy. No canal or foraminal stenosis. L2-3: Mild diffuse disc bulge with disc desiccation. Disc bulge asymmetric to the right with associated mild reactive endplate changes. Moderate facet and ligament flavum hypertrophy. Resultant mild spinal stenosis. Foramina remain patent. L3-4: Mild disc bulge with disc desiccation. Superimposed broad right extraforaminal disc protrusion (series 5, image 19). Moderate facet and ligament flavum hypertrophy. Resultant moderate canal with bilateral subarticular stenosis. Mild bilateral L3 foraminal narrowing. L4-5: Findings concerning for osteomyelitis discitis as above. Sequelae of recent posterior decompression. Residual left greater than right facet degeneration. Persistent moderate spinal stenosis, improved as compared to preoperative exam. Moderate right with severe left L4 foraminal narrowing. L5-S1: Diffuse disc bulge with disc desiccation and mild intervertebral disc space narrowing. Mild reactive endplate changes. Moderate facet and ligament flavum hypertrophy. Resultant moderate bilateral subarticular stenosis. Moderate right worse than left L5 foraminal narrowing. IMPRESSION: 1. Findings consistent with acute  osteomyelitis discitis at L4-5. Heterogeneous signal intensity within the adjacent ventral epidural space could reflect epidural abscess and/or phlegmon or possibly disc material. Sequelae of recent posterior decompression at this level with persistent moderate spinal stenosis. 2. Evidence for associated soft tissue infection/cellulitis within the psoas musculature bilaterally with a few small superimposed abscesses as above. Edema throughout the posterior paraspinous musculature could reflect postoperative changes and/or myositis/infection. 3. Additional multilevel degenerative spondylosis as above, most pronounced at L3-4 where there is resultant moderate spinal stenosis, otherwise stable. Electronically Signed   By: Jeannine Boga M.D.   On: 04/02/2020 18:56   US Abdomen Limited  Result Date: 03/20/2020 CLINICAL DATA:  Right upper quadrant pain EXAM: ULTRASOUND ABDOMEN LIMITED RIGHT UPPER QUADRANT COMPARISON:  None. FINDINGS: Gallbladder: Partially decompressed gallbladder. No gallstones or wall thickening visualized. No sonographic Murphy sign noted by sonographer. Common bile duct: Diameter: 6 mm Liver: Increased echotexture seen throughout. No focal abnormality or biliary ductal dilatation. Portal vein is patent on color Doppler imaging with normal direction of blood flow towards the liver. Other: None. IMPRESSION: Hepatic steatosis. Partially decompressed gallbladder otherwise unremarkable. Electronically Signed   By: Prudencio Pair M.D.   On: 03/20/2020 00:37   DG Chest Port 1 View  Result Date: 03/20/2020 CLINICAL DATA:  Acute kidney injury EXAM: PORTABLE CHEST 1 VIEW COMPARISON:  05/03/2018 chest radiograph. FINDINGS: Intact sternotomy wires. Cardiac valvular prosthesis and CABG clips noted. Stable cardiomediastinal silhouette with top-normal heart size. No pneumothorax. No pleural effusion. Lungs appear clear, with no acute consolidative airspace disease and no pulmonary edema. IMPRESSION: No  active disease. Electronically Signed   By: Ilona Sorrel M.D.   On: 03/20/2020 12:29   DG Knee Left Port  Result Date: 03/21/2020 CLINICAL DATA:  LEFT knee pain and swelling, question septic knee EXAM: PORTABLE LEFT KNEE - 1-2 VIEW COMPARISON:  Portable exam 1146 hours without priors for comparison FINDINGS: Osseous demineralization. Scattered joint space narrowing and chondrocalcinosis. Lateral subluxation of tibia. Large joint effusion. Anterior soft tissue swelling. No acute fracture, dislocation or bone destruction. IMPRESSION: Osseous demineralization with diffuse degenerative changes and question CPPD LEFT knee. Large joint effusion and mild anterior soft tissue swelling without definite acute osseous findings. Septic joint cannot be excluded radiographically; if this is a clinical concern, recommend joint aspiration. Electronically Signed   By: Lavonia Dana M.D.   On: 03/21/2020 13:20   ECHOCARDIOGRAM COMPLETE  Result Date: 03/22/2020  ECHOCARDIOGRAM REPORT   Patient Name:   RICKARD WICKERS Bonn Date of Exam: 03/22/2020 Medical Rec #:  [id number]           Height:       70.0 in Accession #:    [id number]          Weight:       180.6 lb Date of Birth:  1951/01/14           BSA:          1.999 m Patient Age:    23 years            BP:           117/57 mmHg Patient Gender: M                   HR:           86 bpm. Exam Location:  Inpatient Procedure: 2D Echo and Intracardiac Opacification Agent Indications:    Bacteremia 790.7 / R78.81  History:        Patient has prior history of Echocardiogram examinations, most                 recent 01/16/2020. CAD, 23 mm Edwards tissue valve,                 Arrythmias:Atrial Fibrillation; Risk Factors:Hypertension,                 Diabetes and Dyslipidemia.                 Aortic Valve: 23 mm Edwards-SAPIEN valve is present in the                 aortic position.  Sonographer:    Vikki Ports Turrentine Referring Phys: [place]  1. There is no  obvious vegetation, however transaortic gradients across the bioprosthetic valve are increased from the prior study on 01/16/2020, mean gradient 28 mmHg, previously 16 mmHg. Consider TEE for further evaluation.  2. Left ventricular ejection fraction, by estimation, is 55 to 60%. The left ventricle has normal function. The left ventricle has no regional wall motion abnormalities. There is mild concentric left ventricular hypertrophy. Left ventricular diastolic parameters are consistent with Grade I diastolic dysfunction (impaired relaxation). Elevated left atrial pressure.  3. Right ventricular systolic function is normal. The right ventricular size is normal.  4. Left atrial size was mildly dilated.  5. The mitral valve is normal in structure. Mild mitral valve regurgitation. No evidence of mitral stenosis.  6. The aortic valve has been repaired/replaced. Aortic valve regurgitation is not visualized. No aortic stenosis is present. There is a 23 mm Edwards-SAPIEN valve present in the aortic position. Aortic valve mean gradient measures 28.0 mmHg.  7. The inferior vena cava is normal in size with greater than 50% respiratory variability, suggesting right atrial pressure of 3 mmHg. FINDINGS  Left Ventricle: Left ventricular ejection fraction, by estimation, is 55 to 60%. The left ventricle has normal function. The left ventricle has no regional wall motion abnormalities. Definity contrast agent was given IV to delineate the left ventricular  endocardial borders. The left ventricular internal cavity size was normal in size. There is mild concentric left ventricular hypertrophy. Abnormal (paradoxical) septal motion consistent with post-operative status and abnormal (paradoxical) septal motion, consistent with left bundle branch block. Left ventricular diastolic parameters are consistent with Grade I diastolic dysfunction (impaired relaxation). Elevated left atrial pressure. Right Ventricle: The right  ventricular size is  normal. No increase in right ventricular wall thickness. Right ventricular systolic function is normal. Left Atrium: Left atrial size was mildly dilated. Right Atrium: Right atrial size was normal in size. Pericardium: There is no evidence of pericardial effusion. Mitral Valve: The mitral valve is normal in structure. Normal mobility of the mitral valve leaflets. Mild mitral valve regurgitation. No evidence of mitral valve stenosis. Tricuspid Valve: The tricuspid valve is normal in structure. Tricuspid valve regurgitation is mild . No evidence of tricuspid stenosis. Aortic Valve: The aortic valve has been repaired/replaced. Aortic valve regurgitation is not visualized. No aortic stenosis is present. Aortic valve mean gradient measures 28.0 mmHg. Aortic valve peak gradient measures 45.1 mmHg. Aortic valve area, by VTI measures 1.31 cm. There is a 23 mm Edwards-SAPIEN valve present in the aortic position. Pulmonic Valve: The pulmonic valve was normal in structure. Pulmonic valve regurgitation is not visualized. No evidence of pulmonic stenosis. Aorta: The aortic root is normal in size and structure. Venous: The inferior vena cava is normal in size with greater than 50% respiratory variability, suggesting right atrial pressure of 3 mmHg. IAS/Shunts: No atrial level shunt detected by color flow Doppler.  LEFT VENTRICLE PLAX 2D LVIDd:         4.40 cm  Diastology LVIDs:         3.10 cm  LV e' lateral:   14.00 cm/s LV PW:         1.10 cm  LV E/e' lateral: 8.1 LV IVS:        1.00 cm  LV e' medial:    4.88 cm/s LVOT diam:     1.80 cm  LV E/e' medial:  23.4 LV SV:         70 LV SV Index:   35 LVOT Area:     2.54 cm  LEFT ATRIUM           Index       RIGHT ATRIUM           Index LA diam:      4.00 cm 2.00 cm/m  RA Area:     19.80 cm LA Vol (A4C): 71.6 ml 35.83 ml/m RA Volume:   56.70 ml  28.37 ml/m  AORTIC VALVE AV Area (Vmax):    1.23 cm AV Area (Vmean):   1.17 cm AV Area (VTI):     1.31 cm AV Vmax:           335.60  cm/s AV Vmean:          242.600 cm/s AV VTI:            0.532 m AV Peak Grad:      45.1 mmHg AV Mean Grad:      28.0 mmHg LVOT Vmax:         162.00 cm/s LVOT Vmean:        112.000 cm/s LVOT VTI:          0.273 m LVOT/AV VTI ratio: 0.51  AORTA Ao Root diam: 2.50 cm MITRAL VALVE MV Area (PHT): 3.42 cm     SHUNTS MV Decel Time: 222 msec     Systemic VTI:  0.27 m MV E velocity: 114.00 cm/s  Systemic Diam: 1.80 cm MV A velocity: 95.50 cm/s MV E/A ratio:  1.19 Ena Dawley MD Electronically signed by Ena Dawley MD Signature Date/Time: 03/22/2020/1:01:34 PM    Final    ECHO TEE  Result Date: 03/26/2020    TRANSESOPHOGEAL ECHO  REPORT   Patient Name:   TURNER ECKART Caputi Date of Exam: 03/26/2020 Medical Rec #:  [id number]           Height:       70.0 in Accession #:    [id number]          Weight:       173.0 lb Date of Birth:  22-May-1951           BSA:          1.963 m Patient Age:    87 years            BP:           127/92 mmHg Patient Gender: M                   HR:           76 bpm. Exam Location:  Inpatient Procedure: Transesophageal Echo, Color Doppler, Cardiac Doppler and 3D Echo Indications:     Bacteremia R78.81  History:         Patient has prior history of Echocardiogram examinations, most                  recent 03/22/2020.                  Aortic Valve: 23 mm Edwards bioprosthetic valve is present in                  the aortic position. Procedure Date: 08/16/2016.  Sonographer:     Mikki Santee RDCS (AE) Referring Phys:  [id number] Erasmo Downer FURTH Diagnosing Phys: Candee Furbish MD PROCEDURE: After discussion of the risks and benefits of a TEE, an informed consent was obtained. The transesophogeal probe was passed without difficulty through the esophogus of the patient. Sedation performed by different physician. The patient was monitored while under deep sedation. Anesthestetic sedation was provided intravenously by Anesthesiology: 228.4mg  of Propofol. The patient's vital signs; including heart rate, blood  pressure, and oxygen saturation; remained stable throughout the procedure. The patient developed no complications during the procedure. IMPRESSIONS  1. Left ventricular ejection fraction, by estimation, is 60 to 65%. The left ventricle has normal function. The left ventricle has no regional wall motion abnormalities.  2. Right ventricular systolic function is normal. The right ventricular size is normal. There is normal pulmonary artery systolic pressure.  3. No left atrial/left atrial appendage thrombus was detected.  4. The mitral valve is myxomatous. Moderate mitral valve regurgitation. No evidence of mitral stenosis.  5. The aortic valve has been repaired/replaced. Aortic valve regurgitation is not visualized. No aortic stenosis is present. There is a 23 mm Edwards bioprosthetic valve present in the aortic position. Procedure Date: 08/16/2016. Echo findings are consistent with normal structure and function of the aortic valve prosthesis.  6. The inferior vena cava is normal in size with greater than 50% respiratory variability, suggesting right atrial pressure of 3 mmHg. Conclusion(s)/Recommendation(s): Normal biventricular function without evidence of hemodynamically significant valvular heart disease. No evidence of vegetation/infective endocarditis on this transesophageal echocardiogram. FINDINGS  Left Ventricle: Left ventricular ejection fraction, by estimation, is 60 to 65%. The left ventricle has normal function. The left ventricle has no regional wall motion abnormalities. The left ventricular internal cavity size was normal in size. There is  no left ventricular hypertrophy. Right Ventricle: The right ventricular size is normal. No increase in right ventricular wall thickness. Right ventricular systolic function is normal. There is normal  pulmonary artery systolic pressure. The tricuspid regurgitant velocity is 2.41 m/s, and  with an assumed right atrial pressure of 10 mmHg, the estimated right  ventricular systolic pressure is [id number] mmHg. Left Atrium: Left atrial size was normal in size. No left atrial/left atrial appendage thrombus was detected. Right Atrium: Right atrial size was normal in size. Pericardium: There is no evidence of pericardial effusion. Mitral Valve: The mitral valve is myxomatous. There is mild holosystolic prolapse of multiple segments of the anterior leaflet of the mitral valve. Normal mobility of the mitral valve leaflets. Moderate mitral valve regurgitation, with eccentric posteriorly directed jet. No evidence of mitral valve stenosis. Tricuspid Valve: The tricuspid valve is normal in structure. Tricuspid valve regurgitation is not demonstrated. No evidence of tricuspid stenosis. Aortic Valve: The aortic valve has been repaired/replaced. Aortic valve regurgitation is not visualized. No aortic stenosis is present. There is a 23 mm Edwards bioprosthetic valve present in the aortic position. Procedure Date: 08/16/2016. Echo findings are consistent with normal structure and function of the aortic valve prosthesis. Pulmonic Valve: The pulmonic valve was normal in structure. Pulmonic valve regurgitation is not visualized. No evidence of pulmonic stenosis. Aorta: The aortic root is normal in size and structure. Venous: The inferior vena cava is normal in size with greater than 50% respiratory variability, suggesting right atrial pressure of 3 mmHg. IAS/Shunts: No atrial level shunt detected by color flow Doppler.  TRICUSPID VALVE TR Peak grad:   23.2 mmHg TR Vmax:        241.00 cm/s Candee Furbish MD Electronically signed by Candee Furbish MD Signature Date/Time: 03/26/2020/10:49:18 AM    Final    VAS [place] LOWER EXTREMITY VENOUS (DVT) ([facility] and [facility] 7a-7p)  Result Date: 03/20/2020  Lower Venous DVTStudy Indications: Pain, and Edema.  Performing Technologist: June Leap RDMS, RVT  Examination Guidelines: A complete evaluation includes B-mode imaging, spectral Doppler, color Doppler, and power Doppler  as needed of all accessible portions of each vessel. Bilateral testing is considered an integral part of a complete examination. Limited examinations for reoccurring indications may be performed as noted. The reflux portion of the exam is performed with the patient in reverse Trendelenburg.  +---------+---------------+---------+-----------+----------+--------------+ LEFT     CompressibilityPhasicitySpontaneityPropertiesThrombus Aging +---------+---------------+---------+-----------+----------+--------------+ CFV      Full           Yes      Yes                                 +---------+---------------+---------+-----------+----------+--------------+ SFJ      Full                                                        +---------+---------------+---------+-----------+----------+--------------+ FV Prox  Full                                                        +---------+---------------+---------+-----------+----------+--------------+ FV Mid   Full                                                        +---------+---------------+---------+-----------+----------+--------------+  FV DistalFull                                                        +---------+---------------+---------+-----------+----------+--------------+ PFV      Full                                                        +---------+---------------+---------+-----------+----------+--------------+ POP      Full           Yes      Yes                                 +---------+---------------+---------+-----------+----------+--------------+ PTV      Full                                                        +---------+---------------+---------+-----------+----------+--------------+ PERO     Full                                                        +---------+---------------+---------+-----------+----------+--------------+     Summary: LEFT: - There is no evidence of deep vein  thrombosis in the lower extremity.  - Large collection of fluid with internal echoes extending from popliteal fossa to lower calf. Appears to be large ruptured baker's cyst.  *See table(s) above for measurements and observations. Electronically signed by Monica Martinez MD on 03/20/2020 at 2:00:07 PM.    Final    [place] EKG SITE RITE  Result Date: 03/29/2020 If Site Rite image not attached, placement could not be confirmed due to current cardiac rhythm.  [place] EKG SITE RITE  Result Date: 03/26/2020 If Site Rite image not attached, placement could not be confirmed due to current cardiac rhythm.  IR LUMBAR DISC ASPIRATION W/IMG GUIDE  Result Date: 04/07/2020 INDICATION: 69 year old male with history of paroxysmal atrial fibrillation, hypertension, bioprosthetic aortic valve replacement who had undergone recent cervical discectomy and fusion about 3 weeks ago. Admitted now with enterococcal bacteremia, sepsis now s/p 2 weeks of IV antibiotics. Patient was progressing well until the end of last week when he developed new back pain. MRI of the lumbar spine is concerning for L4-5 discitis. He comes or service for a fluoroscopy guided fine needle aspiration. EXAM: Fluoroscopy guided L4-5 disc fine needle aspiration MEDICATIONS: The patient is currently admitted to the hospital and receiving intravenous antibiotics. The antibiotics were administered within an appropriate time frame prior to the initiation of the procedure. ANESTHESIA/SEDATION: Fentanyl 2 mcg IV; Versed 50 mg IV Moderate Sedation Time:  13 The patient was continuously monitored during the procedure by the interventional radiology nurse under my direct supervision. COMPLICATIONS: None immediate. PROCEDURE: Informed written consent was obtained from the patient after a thorough discussion of the procedural risks, benefits and alternatives. All  questions were addressed. Maximal Sterile Barrier Technique was utilized including caps, mask, sterile gowns,  sterile gloves, sterile drape, hand hygiene and skin antiseptic. A timeout was performed prior to the initiation of the procedure. The patient was made to lie prone on the angiography table. The lower lumbar spine was prepped and draped in a sterile fashion. Under fluoroscopy, the L4-5 disc space was delineated and the skin area was marked. The skin was infiltrated with a 1% Lidocaine approximately 5 cm lateral to the spinous process projection on the right. Then, an 18 gauge by 15 cm spinal needle was advanced under fluoroscopy into the L4-5 disc space. A 22 gauge x 20 cm spinal needle was then advanced through the 18 gauge needle into the L4-5 disc. The disc was aspirated and serosanguineous fluid was obtained and sent into 2 capped syringes to the laboratory for microbiology analysis. The needle was subsequently withdrawn. The access site was cleaned and covered with a sterile bandage. IMPRESSION: Successful and uncomplicated fluoroscopy guided fine needle aspiration of the L4-5 disc. Sample sent for culture and staining. Electronically Signed   By: Pedro Earls M.D.   On: 04/07/2020 14:24    Labs:  Basic Metabolic Panel: Recent Labs  Lab 04/07/20 0432 04/08/20 1131 04/08/20 2132 04/09/20 0411 04/13/20 0418  NA 136 135  --  135 139  K 4.2 4.0  --  3.8 3.9  CL 101 100  --  100 101  CO2 26 25  --  27 26  GLUCOSE 175* 226*  --  163* 168*  BUN 21 22  --  19 21  CREATININE 1.32* 1.60*  --  1.40* 1.43*  CALCIUM 9.0 9.0  --  8.7* 8.8*  MG  --   --  2.0  --   --     CBC: Recent Labs  Lab 04/07/20 0432 04/08/20 1131 04/13/20 0418  WBC 7.7 10.5 7.9  NEUTROABS 6.0 9.5* 5.9  HGB 7.6* 7.7* 7.5*  HCT 23.9* 24.6* 24.0*  MCV 94.1 93.2 94.1  PLT 340 330 383    CBG: Recent Labs  Lab 04/12/20 1157 04/12/20 1741 04/12/20 2110 04/13/20 0605 04/13/20 1149  GLUCAP 139* 95 142* 151* 72    Brief HPI:   Johnathan Murray is a 69 y.o. right-handed male with history of  diabetes mellitus, PAF maintained on amiodarone and Eliquis hypertension bioprosthetic aortic valve 2017 lumbar laminectomy with decompression as well as recent C3-4 anterior cervical discectomy with interbody fusion for cervical stenosis myelopathy 03/02/2020 per [name].  Patient lives with wife 1 level home 7 steps to entry.  Presented 03/19/2020 low-grade fever weakness of lower extremities.  Lactic acid 1.6, sodium 130, BUN 98, creatinine 6.40 with latest creatinine 1.6345 2021, WBC 12,600 hemoglobin 10.4 CK of 20.  LFTs markedly elevated.  CT abdomen pelvis as well as CT cervical spine unremarkable.  MRI cervical spine negative for infection.  Patient was hypotensive received IV fluids.  Placed on broad-spectrum antibiotics for suspected sepsis.  MRI lumbar spine findings consistent with acute osteomyelitis discitis at L4-5.  Evidence for associated soft tissue infection cellulitis within the psoas muscle with a few small superimposed abscesses.  Additional multilevel degenerative spondylosis with most pronounced at L3-4.  Neurosurgery consulted no surgical intervention underwent aspiration of disc space L4-5 with results pending.  TEE at that time showed no obvious vegetation and follow-up cardiology service.  Orthopedic service consulted for bilateral knee pain with noted swelling of left knee x-ray showed  diffuse degenerative change as well as large joint effusion.  Doppler showed ruptured Baker's cyst on lower extremity Dopplers.  Patient underwent left lower extremity joint aspiration 03/21/2020 and right lower extremity joint aspiration 03/22/2020 both showing intracellular calcium pyrophosphate crystals.  Patient did receive steroid injection of both knees.  Underwent I&D bilateral knees 03/24/2020.  Infectious disease consulted continue to follow maintained currently on intravenous ampicillin through 05/20/2020.  PICC line placed 03/29/2020.  AKI on CKD continue to improve multifactorial from bacteremia his  amiodarone was on hold due to AKI.  Normocytic anemia hemoglobin 7.6 he was transfused.  Patient was admitted for a comprehensive rehab program.   Hospital Course: Albaro Jove was admitted to rehab 04/07/2020 for inpatient therapies to consist of PT, ST and OT at least three hours five days a week. Past admission physiatrist, therapy team and rehab RN have worked together to provide customized collaborative inpatient rehab.  In regards to patient's sepsis Enterococcus faecalis bacteremia septic knee lumbar discitis osteomyelitis followed by infectious disease maintained on antibiotic therapy.  Chronic Eliquis and since been resumed however with history of PAF contact made in regards to resuming his amiodarone due to irregular heartbeat.  Chronic pain control as advised.  Blood sugars monitored hemoglobin A1c of 6.8 insulin therapy as directed.  His amiodarone was later resumed after contact made to cardiology services now that his creatinine had improved initiated 400 mg twice daily and plan for cardioversion TEE that was completed showing aortic root abscess with vegetation.  Plan was to repeat blood cultures are recommended due to these findings patient was transferred back to acute care services with cardiology services continue to follow.   Blood pressures were monitored on TID basis and soft and monitored  Diabetes has been monitored with ac/hs CBG checks and SSI was use prn for tighter BS control.   He/ is continent of bowel and bladder.  He/ has made gains during rehab stay and is attending therapies  He/ will continue to receive follow up therapies   after discharge  Rehab course: During patient's stay in rehab weekly team conferences were held to monitor patient's progress, set goals and discuss barriers to discharge. At admission, patient required mod assist sit to stand minimal assist to ambulate stand pivot steps.  Max assist lower body bathing set up upper body dressing minimal  assist lower body dressing minimal assist toilet transfers  He/  has had improvement in activity tolerance, balance, postural control as well as ability to compensate for deficits. He/ has had improvement in functional use RUE/LUE  and RLE/LLE as well as improvement in awareness patient was attending therapies with slow progressive gains due to ongoing cardiac issues       Disposition: Guarded    Diet: Diabetic diet  Special Instructions: As per cardiology services  Medications at discharge 1 Tylenol as needed 2 amiodarone 400 mg twice daily 3.  Ampicillin 2 g every 6 hours 4.  Eliquis 5 mg twice daily 5.  Vitamin C 500 mg daily 6.  Rocephin 2 g every 12 hours 7.  Flexeril 5 mg 3 times daily as needed 8.  Colace 100 mg twice daily 9.  NovoLog 3 units 3 times daily 10.  Lantus insulin 10 units twice daily 11.  Synthroid 25 mcg p.o. daily 12.  Niferex 150 mg p.o. daily 13.  Oxycodone 10 to 15 mg every 4 hours as needed pain 14.  OxyContin 10 mg every 12 hours 15.  Lyrica 100 mg  p.o. 3 times daily 16.  Vitamin B12 1000 mcg p.o. daily  30-35 minutes were spent completing discharge summary   Follow-up Information    Lovorn, Jinny Blossom, MD Follow up.   Specialty: Physical Medicine and Rehabilitation Why: Office to call for appointment Contact information: [id number] [place] [address] [phone]        Marchia Bond, MD Follow up.   Specialty: Orthopedic Surgery Why: Call for appointment Contact information: [place] [address] [phone]        Earnie Larsson, MD Follow up.   Specialty: Neurosurgery Why: Call for appointment Contact information: [address] [place] [address] [phone]        Michel Bickers, MD Follow up.   Specialty: Infectious Diseases Why: Call for appointment Contact information: [address] [place] [address] [phone]         Revankar, Reita Cliche, MD Follow up.   Specialty: Cardiology Why: call for appointment Contact information: [place]  [place] [address] [phone]           Signed: Cathlyn Parsons 04/13/2020, 1:09 PM

## 2020-04-13 NOTE — Progress Notes (Signed)
[facility]  Date of Admission:  04/07/2020      Total days of antibiotics    ASSESSMENT: Johnathan Murray is a 69 y.o. male with disseminated enterococcus faecalis infection involving acute discitis L4-5, bilateral septic arthritis to knees (s/p I&D 03/24/20) and now with repeat TEE an evolved aortic root abscess with vegetation ~5 mm x 8 mm on leaflet of RCC of prosthetic valve. He is still having intermittent fevers. Will repeat blood cultures today and plan to remove PICC line empirically given burden of infection, especially if he is facing valve surgery.   I have spoken with Dr. Guy Sandifer team to make them aware, consultation to follow in AM.  With aortic abscess he should be transferred back to acute hospital for telemetry monitoring. Cardiology is on board.    PLAN: 1. Continue IV ampicillin + ceftriaxone  2. Would transfer patient back to telemetry floor 3. TCTS consultation in AM  4. Blood cultures to be repeated now 5. Place PIV and pull PICC line     Principal Problem:   Prosthetic valve endocarditis (HCC) Active Problems:   Bacteremia due to Enterococcus   Aortic valve abscess   Discitis   Typical atrial flutter ([place])   . amiodarone  400 mg Oral BID  . apixaban  5 mg Oral BID  . vitamin C  500 mg Oral Daily  . Chlorhexidine Gluconate Cloth  6 each Topical BID  . docusate sodium  100 mg Oral BID  . feeding supplement (PRO-STAT SUGAR FREE 64)  30 mL Oral TID  . insulin aspart  0-15 Units Subcutaneous TID WC  . insulin aspart  3 Units Subcutaneous TID WC  . insulin glargine  10 Units Subcutaneous BID  . iron polysaccharides  150 mg Oral Q breakfast  . levothyroxine  25 mcg Oral Q0600  . methylPREDNISolone acetate  80 mg Intra-articular Once  . oxyCODONE  10 mg Oral Q12H  . pregabalin  100 mg Oral TID  . Ensure Max Protein  11 oz Oral BID  . senna  1 tablet Oral BID  . traZODone  75 mg Oral QHS  . vitamin B-12  1,000  mcg Oral Daily    SUBJECTIVE: "didn't get very good news today"  He and his wife are in the room following TEE that revealed: 1. Vegetation ~5 mm x 8 mm on leaflet of RCC of prosthetic valve.  2. Aortic root abscess present with color flow on TEE and supplemental TTE.   No new concerns but is still having some shaking chills at times. His wife reports headaches when he first came in but these had subsided once fevers improved.   Back aspirate now growing rare enterococcus    Review of Systems: Review of Systems  Constitutional: Positive for chills, diaphoresis and fever.  Respiratory: Negative for cough and shortness of breath.   Cardiovascular: Positive for palpitations. Negative for chest pain.  Genitourinary: Negative for dysuria.  Musculoskeletal: Positive for back pain and joint pain.  Skin: Negative for rash.    Allergies  Allergen Reactions  . Atorvastatin Other (See Comments)    Urinary retention   . Nebivolol Diarrhea    OBJECTIVE: Vitals:   04/13/20 1047 04/13/20 1055 04/13/20 1105 04/13/20 1126  BP: (!) 77/45 (!) 95/44 (!) 97/49 108/64  Pulse: 81 81 81 88  Resp: 13 17 14 16   Temp: 97.9 F (36.6 C)   97.7 F (36.5 C)  TempSrc: Oral     SpO2: 100% 100% 96% 100%  Weight:      Height:       Body mass index is 24.48 kg/m.   Physical Exam Vitals reviewed.  HENT:     Mouth/Throat:     Mouth: Mucous membranes are moist.     Pharynx: Oropharynx is clear.  Eyes:     General: No scleral icterus.    Pupils: Pupils are equal, round, and reactive to light.  Cardiovascular:     Rate and Rhythm: Normal rate and regular rhythm.     Heart sounds: No murmur.  Pulmonary:     Effort: Pulmonary effort is normal.     Breath sounds: Normal breath sounds.  Neurological:     Mental Status: He is alert.     Lab Results Lab Results  Component Value Date   WBC 7.9 04/13/2020   HGB 7.5 (L) 04/13/2020   HCT 24.0 (L) 04/13/2020   MCV 94.1 04/13/2020   PLT 383  04/13/2020    Lab Results  Component Value Date   CREATININE 1.43 (H) 04/13/2020   BUN 21 04/13/2020   NA 139 04/13/2020   K 3.9 04/13/2020   CL 101 04/13/2020   CO2 26 04/13/2020    Lab Results  Component Value Date   ALT 76 (H) 04/09/2020   AST 38 04/09/2020   ALKPHOS 93 04/09/2020   BILITOT 0.9 04/09/2020     Microbiology: Recent Results (from the past 240 hour(s))  Aerobic/Anaerobic Culture (surgical/deep wound)     Status: None   Collection Time: 04/07/20  9:20 AM   Specimen: Abscess  Result Value Ref Range Status   Specimen Description ABSCESS L4/L5 DISC SPACE  Final   Special Requests Normal  Final   Gram Stain   Final    MODERATE WBC PRESENT, PREDOMINANTLY PMN NO ORGANISMS SEEN Performed at [facility], [address] [place], [place] [address]    Culture RARE ENTEROCOCCUS FAECALIS  Final   Report Status 04/13/2020 FINAL  Final   Organism ID, Bacteria ENTEROCOCCUS FAECALIS  Final      Susceptibility   Enterococcus faecalis - MIC*    AMPICILLIN <=2 SENSITIVE Sensitive     VANCOMYCIN 1 SENSITIVE Sensitive     GENTAMICIN SYNERGY SENSITIVE Sensitive     * RARE ENTEROCOCCUS FAECALIS  Culture, fungus without smear     Status: None (Preliminary result)   Collection Time: 04/07/20  9:20 AM   Specimen: Abscess  Result Value Ref Range Status   Specimen Description ABSCESS L4/L5 DISC SPACE  Final   Special Requests Normal  Final   Culture   Final    NO FUNGUS ISOLATED AFTER 6 DAYS Performed at [facility], [address], [place], [place] [address]    Report Status PENDING  Incomplete  Stat Gram stain     Status: None   Collection Time: 04/08/20  4:01 PM   Specimen: Body Fluid  Result Value Ref Range Status   Specimen Description FLUID SYNOVIAL LEFT KNEE  Final   Special Requests NONE  Final   Gram Stain   Final    ABUNDANT WBC PRESENT,BOTH PMN AND MONONUCLEAR NO ORGANISMS SEEN Performed at [facility], [address], [place],  [place] [address]    Report Status 04/08/2020 FINAL  Final  Body fluid culture     Status: None   Collection Time: 04/08/20  4:01 PM   Specimen: Body Fluid  Result Value Ref Range Status   Specimen Description FLUID SYNOVIAL LEFT KNEE  Final   Special Requests NONE  Final   Gram Stain   Final    ABUNDANT WBC PRESENT, PREDOMINANTLY PMN NO ORGANISMS SEEN    Culture   Final    NO GROWTH 3 DAYS Performed at [facility], [address] [place], [place] [address]    Report Status 04/11/2020 FINAL  Final  Culture, Urine     Status: None   Collection Time: 04/11/20 10:45 PM   Specimen: Urine, Clean Catch  Result Value Ref Range Status   Specimen Description URINE, CLEAN CATCH  Final   Special Requests NONE  Final   Culture   Final    NO GROWTH Performed at [facility], [place] [address], [place], [place] [address]    Report Status 04/13/2020 FINAL  Final     Janene Madeira, MSN, NP-C [facility] [email or web address] Pager: [phone] Office: [phone] [place]: [phone]

## 2020-04-13 NOTE — Interval H&P Note (Signed)
History and Physical Interval Note:  04/13/2020 9:33 AM  Johnathan Murray  has presented today for surgery, with the diagnosis of AFIB.  The various methods of treatment have been discussed with the patient and family. After consideration of risks, benefits and other options for treatment, the patient has consented to  Procedure(s): TRANSESOPHAGEAL ECHOCARDIOGRAM (TEE) (N/A) CARDIOVERSION (N/A) as a surgical intervention.  The patient's history has been reviewed, patient examined, no change in status, stable for surgery.  I have reviewed the patient's chart and labs.  Questions were answered to the patient's satisfaction.    TEE/DCCV for Afib. NPO. On eliquis. Also will look at valves given fevers.   [place] T. Audie Box, [place]  [address] [place], [place] [address] [phone]  9:33 AM

## 2020-04-13 NOTE — Progress Notes (Signed)
Occupational Therapy Session Note  Patient Details  Name: Johnathan Murray MRN: [id number] Date of Birth: [birth year]  Today's Date: 04/13/2020 OT Individual Time:  -    Patient out for procedure during scheduled am session - 2nd attempt made at 1:30 pm but patient declined due to fatigue      Short Term Goals: Week 1:  OT Short Term Goal 1 (Week 1): Patient will thread BLE through LB clothing with Min A and AE PRN. OT Short Term Goal 2 (Week 1): Patient will complete toilet transfer to [facility] with LRAD and Min A. OT Short Term Goal 3 (Week 1): Patient will complete 3/3 parts of toileting task in sitting/standing.  Skilled Therapeutic Interventions/Progress Updates:      Therapy Documentation Precautions:  Precautions Precautions: Fall, Cervical, Other (comment) Precaution Comments: ACDF on 4/12, pt reports they d/c-ed collar 1 week after surgery Required Braces or Orthoses: Spinal Brace Spinal Brace: Lumbar corset Other Brace: for comfort Restrictions Weight Bearing Restrictions: No General: General OT Amount of Missed Time: 60 Minutes   Therapy/Group: Individual Therapy  Carlos Levering 04/13/2020, 1:41 PM

## 2020-04-13 NOTE — H&P (Signed)
History and Physical    Parmvir Ure [id number] DOB: 1951/08/17 DOA: 04/07/2020  PCP: Nicoletta Dress, MD   Patient coming from: Inpatient rehab unit  I have personally briefly reviewed patient's old medical records in [place]  Chief Complaint: Endocarditis  HPI: Tyreck Mcdonnell is a 69 y.o. male with medical history significant of medical history including previous aortic valve replacement biosynthetic in 2017, IDDM, HTN, paroxysmal A. fib on Eliquis, CAD. He presented on 03/19/2020 with low-grade fevers and weakness in the lower extremities. He underwent extensive diagnostic evaluation and was placed on broad-spectrum antibiotics. He was found on MRI of the lumbar spine to have acute osteomyelitis discitis at L4-5 with soft tissue infection cellulitis within the psoas musculature bilaterally and a few small superimposed abscesses. Neurosurgery was consulted and he underwent aspiration of the disc space. Initial TEE showed no obvious vegetation. Then patient developed bilateral knee pain and swelling and x-ray showed diffuse degenerative changes as well as large joint effusion, and joint aspiration on 03/21/2020 and right lower extremity joint aspiration on 03/22/2020 both showing intracellular calcium pyrophosphate crystals with white blood cell count up to 15,000 and culture grew rare Enterococcus faecalis. Patient was transferred to inpatient rehab. Over the weekend, he did develop atrial fibrillation with RVR and TEE-cardioversion was arranged this morning, during TEE it was found there was a newly developed aortic root abscess with vegetations approximately 5 mm x 8 mm on the leaflet of RCC of prosthetic valve.   Review of Systems: As per HPI otherwise 10 point review of systems negative.    Past Medical History:  Diagnosis Date  . Abnormal LFTs (liver function tests) 10/31/2016  . Abnormal nuclear stress test 05/03/2018  . Acid reflux 02/15/2018   not at present  .  Acute otitis externa of left ear 11/13/2019  . Acute suppurative otitis media of left ear without spontaneous rupture of tympanic membrane 11/13/2019  . Anemia    low iron  . Aortic stenosis 05/03/2018  . Arthritis 02/15/2018   Overview:  shoulder  . Cancer ([place])    skin cancer on left wrist  . Cervical myelopathy ([place]) 04/22/2019  . Conductive hearing loss of left ear 11/13/2019  . Coronary artery disease involving native coronary artery of native heart without angina pectoris 05/30/2016  . Coronary artery disease of native artery of native heart with stable angina pectoris ([place])    pt. denies  . Diabetes mellitus due to underlying condition with unspecified complications ([place]) [id number]  . Dyslipidemia 05/30/2016  . Dyspnea    upon exertion,  as of 06/05/09 not having any recent chest pain  . Dysrhythmia    a fib  . Essential hypertension 05/30/2016  . Heart murmur    Has had an Aortic valve replacement  . Hyperactive gag reflex 02/15/2018  . Hypothyroidism   . Intermittent claudication ([place]) 10/31/2018  . Lumbar stenosis with neurogenic claudication 06/11/2019  . Macular degeneration 02/15/2018  . PAF (paroxysmal atrial fibrillation) ([place]) 05/30/2016  . Pre-operative cardiovascular examination 01/03/2020  . Preoperative cardiovascular examination 04/17/2019  . Pseudogout of left knee 03/21/2020  . Renal insufficiency 01/17/2020  . S/P aortic valve replacement 01/17/2020  . Tinnitus of left ear 11/13/2019    Past Surgical History:  Procedure Laterality Date  . ANTERIOR CERVICAL DECOMP/DISCECTOMY FUSION N/A 04/22/2019   Procedure: Anterior Cervical Discectomy and Fusion, Cervical four-five, Cervical five-six;  Surgeon: Earnie Larsson, MD;  Location: [place];  Service: Neurosurgery;  Laterality: N/A;  .  ANTERIOR CERVICAL DECOMP/DISCECTOMY FUSION N/A 03/02/2020   Procedure: Anterior Cervical Decompression Fusion  - Cervical three-Cervical four;  Surgeon: Earnie Larsson, MD;  Location: [place];  Service:  Neurosurgery;  Laterality: N/A;  . AORTIC VALVE REPLACEMENT    . CARDIAC CATHETERIZATION     several stents  . CARDIAC SURGERY    . CARDIOVERSION N/A 04/13/2020   Procedure: CARDIOVERSION;  Surgeon: Geralynn Rile, MD;  Location: [place];  Service: Cardiovascular;  Laterality: N/A;  . CORONARY ARTERY BYPASS GRAFT  2017   patient unsure how many, had at [facility]  . HAND SURGERY    . IR LUMBAR [place] W/IMG GUIDE  04/07/2020  . IRRIGATION AND DEBRIDEMENT KNEE Bilateral 03/24/2020   Procedure: ARTHROSCOPY IRRIGATION AND DEBRIDEMENT BILATERAL  KNEE;  Surgeon: Marchia Bond, MD;  Location: [place];  Service: Orthopedics;  Laterality: Bilateral;  . KNEE SURGERY    . LEFT HEART CATH AND CORONARY ANGIOGRAPHY N/A 01/21/2020   Procedure: LEFT HEART CATH AND CORONARY ANGIOGRAPHY;  Surgeon: Belva Crome, MD;  Location: [place] CV LAB;  Service: Cardiovascular;  Laterality: N/A;  . LEFT HEART CATH AND CORS/GRAFTS ANGIOGRAPHY N/A 05/16/2018   Procedure: LEFT HEART CATH AND CORS/GRAFTS ANGIOGRAPHY;  Surgeon: Burnell Blanks, MD;  Location: [place] CV [facility];  Service: Cardiovascular;  Laterality: N/A;  . LUMBAR LAMINECTOMY/DECOMPRESSION MICRODISCECTOMY Bilateral 06/11/2019   Procedure: Bilateral Lumbar Four-FiveLaminectomy/Foraminotomy;  Surgeon: Earnie Larsson, MD;  Location: [place];  Service: Neurosurgery;  Laterality: Bilateral;  posterior  . TEE WITHOUT CARDIOVERSION N/A 03/26/2020   Procedure: TRANSESOPHAGEAL ECHOCARDIOGRAM (TEE);  Surgeon: Jerline Pain, MD;  Location: [facility] ENDOSCOPY;  Service: Cardiovascular;  Laterality: N/A;  . TEE WITHOUT CARDIOVERSION N/A 04/13/2020   Procedure: TRANSESOPHAGEAL ECHOCARDIOGRAM (TEE);  Surgeon: Geralynn Rile, MD;  Location: [place];  Service: Cardiovascular;  Laterality: N/A;     reports that he has never smoked. He has never used smokeless tobacco. He reports previous alcohol use. He reports that he does not use  drugs.  Allergies  Allergen Reactions  . Atorvastatin Other (See Comments)    Urinary retention   . Nebivolol Diarrhea    Family History  Problem Relation Age of Onset  . Prostate cancer Brother   . Heart disease Maternal Grandmother   . Brain cancer Mother      Prior to Admission medications   Medication Sig Start Date End Date Taking? Authorizing Provider  acetaminophen (TYLENOL) 325 MG tablet Take 2 tablets (650 mg total) by mouth every 6 (six) hours as needed for moderate pain or fever. 04/13/20   Angiulli, Lavon Paganini, PA-C  amiodarone (PACERONE) 400 MG tablet Take 1 tablet (400 mg total) by mouth 2 (two) times daily. 04/13/20   Angiulli, Lavon Paganini, PA-C  ampicillin 2 g in sodium chloride 0.9 % 100 mL Inject 2 g into the vein every 6 (six) hours. 04/07/20   Alma Friendly, MD  apixaban (ELIQUIS) 5 MG TABS tablet Take 1 tablet (5 mg total) by mouth 2 (two) times daily. 02/04/20   Revankar, Reita Cliche, MD  cefTRIAXone 2 g in sodium chloride 0.9 % 100 mL Inject 2 g into the vein every 12 (twelve) hours. 04/13/20   Angiulli, Lavon Paganini, PA-C  cyclobenzaprine (FLEXERIL) 5 MG tablet Take 1 tablet (5 mg total) by mouth 3 (three) times daily as needed for muscle spasms. 04/13/20   Angiulli, Lavon Paganini, PA-C  docusate sodium (COLACE) 100 MG capsule Take 1 capsule (100 mg  total) by mouth 2 (two) times daily. 04/07/20   Alma Friendly, MD  insulin aspart (NOVOLOG) 100 UNIT/ML injection Inject 0-15 Units into the skin 3 (three) times daily with meals. 04/13/20   Angiulli, Lavon Paganini, PA-C  insulin aspart (NOVOLOG) 100 UNIT/ML injection Inject 3 Units into the skin 3 (three) times daily with meals. 04/13/20   Angiulli, Lavon Paganini, PA-C  insulin glargine (LANTUS) 100 UNIT/ML injection Inject 0.1 mLs (10 Units total) into the skin 2 (two) times daily. 04/13/20   Angiulli, Lavon Paganini, PA-C  levothyroxine (SYNTHROID) 25 MCG tablet Take 25 mcg by mouth daily before breakfast.  01/23/20   [provider]    metoprolol tartrate (LOPRESSOR) 50 MG tablet TAKE 1 TABLET (50 MG TOTAL) BY MOUTH DAILY AS NEEDED. 03/26/20 06/24/20  Revankar, Reita Cliche, MD  nitroGLYCERIN (NITROSTAT) 0.4 MG SL tablet Place 1 tablet (0.4 mg total) under the tongue every 5 (five) minutes as needed for chest pain. 01/13/20   Revankar, Reita Cliche, MD  oxyCODONE (OXYCONTIN) 10 mg 12 hr tablet Take 1 tablet (10 mg total) by mouth every 12 (twelve) hours. 04/13/20   Angiulli, Lavon Paganini, PA-C  pregabalin (LYRICA) 100 MG capsule Take 1 capsule (100 mg total) by mouth 3 (three) times daily. 04/13/20   Angiulli, Lavon Paganini, PA-C  senna (SENOKOT) 8.6 MG TABS tablet Take 1 tablet (8.6 mg total) by mouth at bedtime. 04/07/20   Alma Friendly, MD  vitamin C (ASCORBIC ACID) 500 MG tablet Take 500 mg by mouth daily.     [provider]    Physical Exam: Vitals:   04/13/20 1126 04/13/20 1618 04/13/20 1802 04/13/20 1838  BP: 108/64 (!) 97/57  (!) 96/54  Pulse: 88 92  96  Resp: 16 16    Temp: 97.7 F (36.5 C) 99.7 F (37.6 C)  (!) 100.9 F (38.3 C)  TempSrc:    Axillary  SpO2: 100% 100%  100%  Weight:   82.5 kg   Height:   5\' 10"  (1.778 m)     Constitutional: NAD, calm, comfortable Vitals:   04/13/20 1126 04/13/20 1618 04/13/20 1802 04/13/20 1838  BP: 108/64 (!) 97/57  (!) 96/54  Pulse: 88 92  96  Resp: 16 16    Temp: 97.7 F (36.5 C) 99.7 F (37.6 C)  (!) 100.9 F (38.3 C)  TempSrc:    Axillary  SpO2: 100% 100%  100%  Weight:   82.5 kg   Height:   5\' 10"  (1.778 m)    Eyes: PERRL, lids and conjunctivae normal ENMT: Mucous membranes are moist. Posterior pharynx clear of any exudate or lesions.Normal dentition.  Neck: normal, supple, no masses, no thyromegaly Respiratory: clear to auscultation bilaterally, no wheezing, no crackles. Normal respiratory effort. No accessory muscle use.  Cardiovascular: Regular rate and rhythm, loud murmur systolic on heart base. No extremity edema. 2+ pedal pulses. No carotid bruits.  Abdomen:  no tenderness, no masses palpated. No hepatosplenomegaly. Bowel sounds positive.  Musculoskeletal: no clubbing / cyanosis. No joint deformity upper and lower extremities. Good ROM, no contractures. Normal muscle tone.  Skin: no rashes, lesions, ulcers. No induration Neurologic: CN 2-12 grossly intact. Sensation intact, DTR normal. Strength 5/5 in all 4.  Psychiatric: Normal judgment and insight. Alert and oriented x 3. Normal mood.     Labs on Admission: I have personally reviewed following labs and imaging studies  CBC: Recent Labs  Lab 04/07/20 0432 04/08/20 1131 04/13/20 0418  WBC 7.7 10.5 7.9  NEUTROABS 6.0 9.5* 5.9  HGB 7.6* 7.7* 7.5*  HCT 23.9* 24.6* 24.0*  MCV 94.1 93.2 94.1  PLT 340 330 [id number]   Basic Metabolic Panel: Recent Labs  Lab 04/07/20 0432 04/08/20 1131 04/08/20 2132 04/09/20 0411 04/13/20 0418  NA 136 135  --  135 139  K 4.2 4.0  --  3.8 3.9  CL 101 100  --  100 101  CO2 26 25  --  27 26  GLUCOSE 175* 226*  --  163* 168*  BUN 21 22  --  19 21  CREATININE 1.32* 1.60*  --  1.40* 1.43*  CALCIUM 9.0 9.0  --  8.7* 8.8*  MG  --   --  2.0  --   --    GFR: Estimated Creatinine Clearance: 51.8 mL/min (A) (by C-G formula based on SCr of 1.43 mg/dL (H)). Liver Function Tests: Recent Labs  Lab 04/08/20 1131 04/09/20 0411  AST 60* 38  ALT 94* 76*  ALKPHOS 106 93  BILITOT 1.2 0.9  PROT 6.2* 6.1*  ALBUMIN 1.9* 1.9*   No results for input(s): LIPASE, AMYLASE in the last 168 hours. No results for input(s): AMMONIA in the last 168 hours. Coagulation Profile: No results for input(s): INR, PROTIME in the last 168 hours. Cardiac Enzymes: No results for input(s): CKTOTAL, CKMB, CKMBINDEX, TROPONINI in the last 168 hours. BNP (last 3 results) No results for input(s): PROBNP in the last 8760 hours. HbA1C: No results for input(s): HGBA1C in the last 72 hours. CBG: Recent Labs  Lab 04/12/20 1741 04/12/20 2110 04/13/20 0605 04/13/20 1149 04/13/20 1659    GLUCAP 95 142* 151* 72 165*   Lipid Profile: No results for input(s): CHOL, HDL, LDLCALC, TRIG, CHOLHDL, LDLDIRECT in the last 72 hours. Thyroid Function Tests: No results for input(s): TSH, T4TOTAL, FREET4, T3FREE, THYROIDAB in the last 72 hours. Anemia Panel: No results for input(s): VITAMINB12, FOLATE, FERRITIN, TIBC, IRON, RETICCTPCT in the last 72 hours. Urine analysis:    Component Value Date/Time   COLORURINE YELLOW 04/11/2020 2245   APPEARANCEUR CLEAR 04/11/2020 2245   LABSPEC 1.009 04/11/2020 2245   PHURINE 7.0 04/11/2020 2245   GLUCOSEU NEGATIVE 04/11/2020 2245   HGBUR NEGATIVE 04/11/2020 2245   BILIRUBINUR NEGATIVE 04/11/2020 2245   KETONESUR NEGATIVE 04/11/2020 2245   PROTEINUR NEGATIVE 04/11/2020 2245   NITRITE NEGATIVE 04/11/2020 2245   LEUKOCYTESUR NEGATIVE 04/11/2020 2245    Radiological Exams on Admission: ECHO TEE  Result Date: 04/13/2020    TRANSESOPHOGEAL ECHO REPORT   Patient Name:   BRYNDEN TIDD Krygier Date of Exam: 04/13/2020 Medical Rec #:  [id number]           Height:       70.0 in Accession #:    [id number]          Weight:       170.6 lb Date of Birth:  07/26/1951           BSA:          1.951 m Patient Age:    89 years            BP:           105/77 mmHg Patient Gender: M                   HR:           108 bpm. Exam Location:  Inpatient Procedure: Transesophageal Echo, 3D Echo, Color Doppler and Cardiac Doppler Indications:  I48.91* Unspecified atrial fibrillation  History:         Patient has prior history of Echocardiogram examinations, most                  recent 03/26/2020. CAD, Arrythmias:Atrial Fibrillation; Risk                  Factors:Diabetes, Dyslipidemia and Hypertension. 65mm Edwards                  Sapien Bioprosthetic Aortic Valve implanted 08/16/16.  Sonographer:     Raquel Sarna Senior RDCS Referring Phys:  [id number] Tami Lin [facility] Diagnosing Phys: Eleonore Chiquito MD PROCEDURE: After discussion of the risks and benefits of a TEE, an informed  consent was obtained from the patient. TEE procedure time was 19 minutes. The transesophogeal probe was passed without difficulty through the esophogus of the patient. Local oropharyngeal anesthetic was provided with viscous lidocaine. Sedation performed by different physician. The patient was monitored while under deep sedation. Anesthestetic sedation was provided intravenously by Anesthesiology: 260mg  of Propofol, 50mg  of Lidocaine. Image quality was excellent. The patient's vital signs; including heart rate, blood pressure, and oxygen saturation; remained stable throughout the procedure. The patient developed no complications during the procedure. A successful direct current cardioversion was performed at 200 joules with 1 attempt. IMPRESSIONS  1. There is a small vegetation attached to the leaflet of the [place] that measures 0.8 cm x 0.5 cm. There is a pseudoaneurysm located in the anterior aspect of the aortic root, which is actually best seen on the supplemental TTE images. There is no fistulous connection with the LVOT and no apparent regurgitation. The color flow appears to be in and out of the pseudoaneurysm. 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 A cardiac CT is recommended to better characterize the location and extent of the pseudoaneurysm. The aortic valve has been repaired/replaced. Aortic valve regurgitation is not visualized. Aortic valve mean gradient measures 13.0 mmHg. Aortic valve Vmax measures 2.32 m/s.  2. Left ventricular ejection fraction, by estimation, is 50 to 55%. The left ventricle has low normal function. The left ventricle has no regional wall motion abnormalities.  3. Right ventricular systolic function is normal. The right ventricular size is normal.  4. No left atrial/left atrial appendage thrombus was detected. The LAA emptying velocity was 56 cm/s.  5. The mitral valve is grossly normal. Mild mitral valve regurgitation. No  evidence of mitral stenosis.  6. There is a pseudoaneurysm of the aortic root anterior to the RCC with clear color flow into the affected area. Aortic root abscess present. There is mild (Grade II) layered plaque involving the descending aorta. FINDINGS  Left Ventricle: Left ventricular ejection fraction, by estimation, is 50 to 55%. The left ventricle has low normal function. The left ventricle has no regional wall motion abnormalities. The left ventricular internal cavity size was normal in size. There is no left ventricular hypertrophy. Abnormal (paradoxical) septal motion consistent with post-operative status. Right Ventricle: The right ventricular size is normal. No increase in right ventricular wall thickness. Right ventricular systolic function is normal. Left Atrium: Left atrial size was normal in size. No left atrial/left atrial appendage thrombus was detected. The LAA emptying velocity was 56 cm/s. Right Atrium: Right atrial size was normal in size. Pericardium: Trivial pericardial effusion is present. Mitral Valve: The mitral valve is grossly normal. There is mild thickening of the mitral valve leaflet(s). Mild mitral valve regurgitation. No evidence  of mitral valve stenosis. There is no evidence of mitral valve vegetation. Tricuspid Valve: The tricuspid valve is grossly normal. Tricuspid valve regurgitation is trivial. No evidence of tricuspid stenosis. There is no evidence of tricuspid valve vegetation. Aortic Valve: There is a small vegetation attached to the leaflet of the [place] that measures 0.8 cm x 0.5 cm. There is a pseudoaneurysm located in the anterior aspect of the aortic root, which is actually best seen on the supplemental TTE images. There is no fistulous connection with the LVOT and no apparent regurgitation. The color flow appears to be in and out of the pseudoaneurysm. 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 A cardiac CT is  recommended to better characterize the location and extent of the pseudoaneurysm. The aortic valve has been repaired/replaced. Aortic valve regurgitation is not visualized. Aortic valve mean gradient measures 13.0 mmHg. Aortic valve peak gradient measures 21.5 mmHg. Aortic valve area, by VTI measures 1.79 cm. There is a 23 mm bioprosthetic valve present in the aortic position. Pulmonic Valve: The pulmonic valve was grossly normal. Pulmonic valve regurgitation is not visualized. No evidence of pulmonic stenosis. Aorta: There is a pseudoaneurysm of the aortic root anterior to the RCC with clear color flow into the affected area. Root abscess present. There is mild (Grade II) layered plaque involving the descending aorta. IAS/Shunts: No atrial level shunt detected by color flow Doppler.  LEFT VENTRICLE PLAX 2D LVOT diam:     2.10 cm LV SV:         62 LV SV Index:   32 LVOT Area:     3.46 cm  AORTIC VALVE AV Area (VTI):     1.79 cm AV Vmax:           232.08 cm/s AV Vmean:          172.165 cm/s AV VTI:            0.349 m AV Peak Grad:      21.5 mmHg AV Mean Grad:      13.0 mmHg LVOT VTI:          0.180 m LVOT/AV VTI ratio: 0.52  AORTA Ao Asc diam: 3.20 cm TRICUSPID VALVE TR Peak grad:   17.5 mmHg TR Vmax:        209.00 cm/s  SHUNTS Systemic VTI:  0.18 m Systemic Diam: 2.10 cm Eleonore Chiquito MD Electronically signed by Eleonore Chiquito MD Signature Date/Time: 04/13/2020/2:40:09 PM    Final     EKG: Independently reviewed.  A. fib  Assessment/Plan Principal Problem:   Prosthetic valve endocarditis (HCC) Active Problems:   Bacteremia due to Enterococcus   Discitis   Typical atrial flutter (HCC)   Aortic valve abscess  Acute bacterial endocarditis with vegetation and aortic root abscess -Discussed with cardiologist and cardiac surgery, recommend stop Eliquis and start heparin drip -Continue antibiotics as per infectious disease -Discussed with cardiac surgery, likely will need surgical intervention after for 5  days Eliquis washed off -Neuro check Q shift  L4-L5 discitis with bacteremia -Antibiotics as per ID  IDDM -Lantus plus sliding scale  Hypertension -Borderline hypotensive today, hold BP meds  Chronic A. Fib -Continue rate control medications, switch Eliquis to heparin drip  Hypothyroidism -Continue Synthroid  DVT prophylaxis: Heparin drip Code Status: Full code Family Communication: Wife at bedside Disposition Plan: Complicated case, likely will need more than 1 week hospital stay for cardiac surgery and recovery Consults called: Cardiology and cardiovascular surgery Admission  status: PCU   Lequita Halt MD [facility] Pager [phone]   04/13/2020, 7:32 PM

## 2020-04-13 NOTE — Anesthesia Preprocedure Evaluation (Addendum)
Anesthesia Evaluation  Patient identified by MRN, date of birth, ID band Patient awake    Reviewed: Allergy & Precautions, NPO status , Patient's Chart, lab work & pertinent test results, reviewed documented beta blocker date and time   Airway Mallampati: I  TM Distance: >3 FB Neck ROM: Full    Dental  (+) Edentulous Lower, Dental Advisory Given, Edentulous Upper   Pulmonary shortness of breath and with exertion,    Pulmonary exam normal breath sounds clear to auscultation       Cardiovascular hypertension, Pt. on medications and Pt. on home beta blockers + angina + CAD  Normal cardiovascular exam+ dysrhythmias Atrial Fibrillation + Valvular Problems/Murmurs AS  Rhythm:Irregular Rate:Normal  S/P AVR   Neuro/Psych negative neurological ROS  negative psych ROS   GI/Hepatic Neg liver ROS, GERD  Medicated and Controlled,  Endo/Other  diabetes, Poorly Controlled, Type 2, Oral Hypoglycemic Agents, Insulin DependentHypothyroidism Hyperlipidemia  Renal/GU ARFRenal disease  negative genitourinary   Musculoskeletal  (+) Arthritis , Osteoarthritis,    Abdominal   Peds  Hematology  (+) anemia , Eliquis therapy- last dose   Anesthesia Other Findings   Reproductive/Obstetrics                            Anesthesia Physical Anesthesia Plan  ASA: III  Anesthesia Plan: General   Post-op Pain Management:    Induction: Intravenous  PONV Risk Score and Plan: 2 and Ondansetron, Propofol infusion and Treatment may vary due to age or medical condition  Airway Management Planned: Mask, Natural Airway and Nasal Cannula  Additional Equipment:   Intra-op Plan:   Post-operative Plan:   Informed Consent: I have reviewed the patients History and Physical, chart, labs and discussed the procedure including the risks, benefits and alternatives for the proposed anesthesia with the patient or authorized  representative who has indicated his/her understanding and acceptance.     Dental advisory given  Plan Discussed with: CRNA and Surgeon  Anesthesia Plan Comments:         Anesthesia Quick Evaluation

## 2020-04-13 NOTE — Consult Note (Addendum)
[address]       [place] [address]             [phone]        Johnathan Murray [place] Medical Record [id number] Date of Birth: 1951-11-02  Referring: No ref. provider found Primary Care: Nicoletta Dress, MD Primary Cardiologist:Rajan Reuel Derby, MD  Chief Complaint:  aortic root abscess  History of Present Illness:     Patient is a 69 year old male with a complex medical history including previous aortic valve replacement in 2017. He presented on 03/19/2020 with low-grade fevers and weakness in the lower extremities. He underwent extensive diagnostic evaluation and was placed on broad-spectrum antibiotics. He was found on MRI of the lumbar spine to have acute osteomyelitis discitis at L4-5. Evidence of associated soft tissue infection cellulitis within the psoas musculature bilaterally and a few small superimposed abscesses. Neurosurgery was consulted and he underwent aspiration of the disc space. Initial TEE showed no obvious vegetation. Orthopedic services were obtained by Dr. Mardelle Matte due to bilateral knee pain and swelling and x-ray showed diffuse degenerative changes as well as large joint effusion. Doppler exam showed a ruptured Baker's cyst on the left lower extremity. The patient underwent left lower extremity joint aspiration on 03/21/2020 and right lower extremity joint aspiration on 03/22/2020 both showing intracellular calcium pyrophosphate crystals with white blood cell count up to 15,000 and culture grew rare Enterococcus faecalis. He did improve over time and was transferred to inpatient CIR. He did develop atrial fibrillation with rapid ventricular response and was scheduled for TEE-DCCV on today's date. Unfortunately this revealed an evolved aortic root abscess with vegetations approximately 5 mm x 8 mm on the leaflet of RCC of prosthetic valve. He is still having intermittent fevers. We are asked to see the patient in cardiothoracic surgical  consultation for possible surgical intervention.   Current Activity/ Functional Status:patient currently in CIR for intense rehab  Zubrod Score: At the time of surgery this patient's most appropriate activity status/level should be described as: []     0    Normal activity, no symptoms []     1    Restricted in physical strenuous activity but ambulatory, able to do out light work [x]     2    Ambulatory and capable of self care, unable to do work activities, up and about                 more than 50%  Of the time                            []     3    Only limited self care, in bed greater than 50% of waking hours []     4    Completely disabled, no self care, confined to bed or chair []     5    Moribund  Past Medical History:  Diagnosis Date  . Abnormal LFTs (liver function tests) 10/31/2016  . Abnormal nuclear stress test 05/03/2018  . Acid reflux 02/15/2018   not at present  . Acute otitis externa of left ear 11/13/2019  . Acute suppurative otitis media of left ear without spontaneous rupture of tympanic membrane 11/13/2019  . Anemia    low iron  . Aortic stenosis 05/03/2018  . Arthritis 02/15/2018   Overview:  shoulder  . Cancer ([place])    skin cancer on left wrist  . Cervical  myelopathy ([place]) 04/22/2019  . Conductive hearing loss of left ear 11/13/2019  . Coronary artery disease involving native coronary artery of native heart without angina pectoris 05/30/2016  . Coronary artery disease of native artery of native heart with stable angina pectoris ([place])    pt. denies  . Diabetes mellitus due to underlying condition with unspecified complications ([place]) [id number]  . Dyslipidemia 05/30/2016  . Dyspnea    upon exertion,  as of 06/05/09 not having any recent chest pain  . Dysrhythmia    a fib  . Essential hypertension 05/30/2016  . Heart murmur    Has had an Aortic valve replacement  . Hyperactive gag reflex 02/15/2018  . Hypothyroidism   . Intermittent claudication ([place]) 10/31/2018  .  Lumbar stenosis with neurogenic claudication 06/11/2019  . Macular degeneration 02/15/2018  . PAF (paroxysmal atrial fibrillation) ([place]) 05/30/2016  . Pre-operative cardiovascular examination 01/03/2020  . Preoperative cardiovascular examination 04/17/2019  . Pseudogout of left knee 03/21/2020  . Renal insufficiency 01/17/2020  . S/P aortic valve replacement 01/17/2020  . Tinnitus of left ear 11/13/2019    Past Surgical History:  Procedure Laterality Date  . ANTERIOR CERVICAL DECOMP/DISCECTOMY FUSION N/A 04/22/2019   Procedure: Anterior Cervical Discectomy and Fusion, Cervical four-five, Cervical five-six;  Surgeon: Earnie Larsson, MD;  Location: [facility];  Service: Neurosurgery;  Laterality: N/A;  . ANTERIOR CERVICAL DECOMP/DISCECTOMY FUSION N/A 03/02/2020   Procedure: Anterior Cervical Decompression Fusion  - Cervical three-Cervical four;  Surgeon: Earnie Larsson, MD;  Location: [facility];  Service: Neurosurgery;  Laterality: N/A;  . AORTIC VALVE REPLACEMENT    . CARDIAC CATHETERIZATION     several stents  . CARDIAC SURGERY    . CORONARY ARTERY BYPASS GRAFT  2017   patient unsure how many, had at [facility]  . HAND SURGERY    . IR LUMBAR Lexa W/IMG GUIDE  04/07/2020  . IRRIGATION AND DEBRIDEMENT KNEE Bilateral 03/24/2020   Procedure: ARTHROSCOPY IRRIGATION AND DEBRIDEMENT BILATERAL  KNEE;  Surgeon: Marchia Bond, MD;  Location: [facility];  Service: Orthopedics;  Laterality: Bilateral;  . KNEE SURGERY    . LEFT HEART CATH AND CORONARY ANGIOGRAPHY N/A 01/21/2020   Procedure: LEFT HEART CATH AND CORONARY ANGIOGRAPHY;  Surgeon: Belva Crome, MD;  Location: [facility] [place] CV LAB;  Service: Cardiovascular;  Laterality: N/A;  . LEFT HEART CATH AND CORS/GRAFTS ANGIOGRAPHY N/A 05/16/2018   Procedure: LEFT HEART CATH AND CORS/GRAFTS ANGIOGRAPHY;  Surgeon: Burnell Blanks, MD;  Location: [facility] CV LAB;  Service: Cardiovascular;  Laterality: N/A;  . LUMBAR LAMINECTOMY/DECOMPRESSION  MICRODISCECTOMY Bilateral 06/11/2019   Procedure: Bilateral Lumbar Four-FiveLaminectomy/Foraminotomy;  Surgeon: Earnie Larsson, MD;  Location: [facility];  Service: Neurosurgery;  Laterality: Bilateral;  posterior  . TEE WITHOUT CARDIOVERSION N/A 03/26/2020   Procedure: TRANSESOPHAGEAL ECHOCARDIOGRAM (TEE);  Surgeon: Jerline Pain, MD;  Location: [facility] ENDOSCOPY;  Service: Cardiovascular;  Laterality: N/A;    Social History   Tobacco Use  Smoking Status Never Smoker  Smokeless Tobacco Never Used    Social History   Substance and Sexual Activity  Alcohol Use Not Currently     Allergies  Allergen Reactions  . Atorvastatin Other (See Comments)    Urinary retention   . Nebivolol Diarrhea    Current Facility-Administered Medications  Medication Dose Route Frequency Provider Last Rate Last Admin  . acetaminophen (TYLENOL) tablet 650 mg  650 mg Oral Q6H PRN Cathlyn Parsons, PA-C   650 mg at 04/12/20 2015  . amiodarone (  PACERONE) tablet 400 mg  400 mg Oral BID Lovorn, Jinny Blossom, MD   400 mg at 04/13/20 0850  . ampicillin (OMNIPEN) 2 g in sodium chloride 0.9 % 100 mL IVPB  2 g Intravenous Q6H Angiulli, Lavon Paganini, PA-C 300 mL/hr at 04/13/20 1233 2 g at 04/13/20 1233  . apixaban (ELIQUIS) tablet 5 mg  5 mg Oral BID Cathlyn Parsons, PA-C   5 mg at 04/13/20 0850  . ascorbic acid (VITAMIN C) tablet 500 mg  500 mg Oral Daily Cathlyn Parsons, PA-C   500 mg at 04/13/20 0850  . bisacodyl (DULCOLAX) suppository 10 mg  10 mg Rectal Daily PRN Cathlyn Parsons, PA-C   10 mg at 04/12/20 1606  . cefTRIAXone (ROCEPHIN) 2 g in sodium chloride 0.9 % 100 mL IVPB  2 g Intravenous Q12H Carlyle Basques, MD 200 mL/hr at 04/13/20 1132 2 g at 04/13/20 1132  . Chlorhexidine Gluconate Cloth 2 % PADS 6 each  6 each Topical BID Courtney Heys, MD   6 each at 04/13/20 0528  . cyclobenzaprine (FLEXERIL) tablet 5 mg  5 mg Oral TID PRN Lovorn, Megan, MD      . docusate sodium (COLACE) capsule 100 mg  100 mg Oral BID Cathlyn Parsons, PA-C   100 mg at 04/13/20 0850  . feeding supplement (PRO-STAT SUGAR FREE 64) liquid 30 mL  30 mL Oral TID Bary Leriche, PA-C   30 mL at 04/13/20 0850  . insulin aspart (novoLOG) injection 0-15 Units  0-15 Units Subcutaneous TID WC Cathlyn Parsons, PA-C   3 Units at 04/13/20 0850  . insulin aspart (novoLOG) injection 3 Units  3 Units Subcutaneous TID WC Cathlyn Parsons, PA-C   3 Units at 04/12/20 1804  . insulin glargine (LANTUS) injection 10 Units  10 Units Subcutaneous BID Cathlyn Parsons, PA-C   10 Units at 04/13/20 [id number]  . iron polysaccharides (NIFEREX) capsule 150 mg  150 mg Oral Q breakfast Cathlyn Parsons, PA-C   150 mg at 04/13/20 0850  . levothyroxine (SYNTHROID) tablet 25 mcg  25 mcg Oral Q0600 Cathlyn Parsons, PA-C   25 mcg at 04/13/20 0557  . methylPREDNISolone acetate (DEPO-MEDROL) injection 80 mg  80 mg Intra-articular Once Deo, Latini, PA-C      . ondansetron [facility]) tablet 4 mg  4 mg Oral Q6H PRN Angiulli, Lavon Paganini, PA-C       Or  . ondansetron [facility]) injection 4 mg  4 mg Intravenous Q6H PRN Angiulli, Lavon Paganini, PA-C      . oxyCODONE (Oxy IR/ROXICODONE) immediate release tablet 10-15 mg  10-15 mg Oral Q4H PRN Lovorn, Megan, MD   15 mg at 04/13/20 1246  . oxyCODONE (OXYCONTIN) 12 hr tablet 10 mg  10 mg Oral Q12H Lovorn, Megan, MD   10 mg at 04/13/20 0850  . pregabalin (LYRICA) capsule 100 mg  100 mg Oral TID Courtney Heys, MD   100 mg at 04/13/20 0848  . protein supplement (ENSURE MAX) liquid  11 oz Oral BID Bary Leriche, PA-C   11 oz at 04/13/20 0851  . senna (SENOKOT) tablet 8.6 mg  1 tablet Oral BID Cathlyn Parsons, PA-C   8.6 mg at 04/13/20 0849  . sodium chloride flush (NS) 0.9 % injection 10-40 mL  10-40 mL Intracatheter PRN Lovorn, Megan, MD   10 mL at 04/12/20 0423  . sorbitol 70 % solution 30 mL  30 mL Oral Daily PRN Angiulli,  Lavon Paganini, PA-C   30 mL at 04/11/20 2017  . traZODone (DESYREL) tablet 75 mg  75 mg Oral QHS Lovorn, Megan, MD    75 mg at 04/12/20 2138  . vitamin B-12 (CYANOCOBALAMIN) tablet 1,000 mcg  1,000 mcg Oral Daily Cathlyn Parsons, PA-C   1,000 mcg at 04/13/20 [id number]    Medications Prior to Admission  Medication Sig Dispense Refill Last Dose  . amLODipine (NORVASC) 10 MG tablet Take 10 mg by mouth at bedtime.      Marland Kitchen ampicillin 2 g in sodium chloride 0.9 % 100 mL Inject 2 g into the vein every 6 (six) hours.     Marland Kitchen apixaban (ELIQUIS) 5 MG TABS tablet Take 1 tablet (5 mg total) by mouth 2 (two) times daily. 180 tablet 3   . Coenzyme Q10 (COQ10) 200 MG CAPS Take 200 mg by mouth daily.      Marland Kitchen docusate sodium (COLACE) 100 MG capsule Take 1 capsule (100 mg total) by mouth 2 (two) times daily. 10 capsule 0   . feeding supplement, ENSURE ENLIVE, (ENSURE ENLIVE) LIQD Take 237 mLs by mouth 4 (four) times daily. 237 mL 12   . insulin aspart (NOVOLOG) 100 UNIT/ML injection Inject 0-5 Units into the skin at bedtime. 10 mL 11   . insulin aspart (NOVOLOG) 100 UNIT/ML injection Inject 0-15 Units into the skin 3 (three) times daily with meals. 10 mL 11   . insulin glargine (LANTUS) 100 UNIT/ML injection Inject 0.1 mLs (10 Units total) into the skin 2 (two) times daily. 10 mL 11   . iron polysaccharides (NIFEREX) 150 MG capsule Take 1 capsule (150 mg total) by mouth daily with breakfast.     . levothyroxine (SYNTHROID) 25 MCG tablet Take 25 mcg by mouth daily before breakfast.      . metoprolol tartrate (LOPRESSOR) 50 MG tablet TAKE 1 TABLET (50 MG TOTAL) BY MOUTH DAILY AS NEEDED. 90 tablet 1   . nitroGLYCERIN (NITROSTAT) 0.4 MG SL tablet Place 1 tablet (0.4 mg total) under the tongue every 5 (five) minutes as needed for chest pain. 25 tablet 6   . oxyCODONE (OXY IR/ROXICODONE) 5 MG immediate release tablet Take 1-2 tablets (5-10 mg total) by mouth every 4 (four) hours as needed for moderate pain or severe pain. 30 tablet 0   . senna (SENOKOT) 8.6 MG TABS tablet Take 1 tablet (8.6 mg total) by mouth at bedtime. 120 tablet 0   .  traZODone (DESYREL) 50 MG tablet Take 1 tablet (50 mg total) by mouth at bedtime as needed for sleep.     . vitamin B-12 (CYANOCOBALAMIN) 1000 MCG tablet Take 1,000 mcg by mouth daily.     . vitamin C (ASCORBIC ACID) 500 MG tablet Take 500 mg by mouth daily.      Marland Kitchen zinc gluconate 50 MG tablet Take 50 mg by mouth daily.       Family History  Problem Relation Age of Onset  . Prostate cancer Brother   . Heart disease Maternal Grandmother   . Brain cancer Mother      Review of Systems:   Review of Systems  Constitutional: Positive for chills, diaphoresis, fever and malaise/fatigue. Negative for weight loss.  HENT: Negative for congestion, ear discharge, ear pain, hearing loss, nosebleeds, sinus pain, sore throat and tinnitus.   Eyes: Negative for blurred vision, double vision, photophobia, pain, discharge and redness.  Respiratory: Positive for shortness of breath. Negative for cough, hemoptysis, sputum production, wheezing and  stridor.   Cardiovascular: Positive for palpitations. Negative for chest pain, orthopnea, claudication, leg swelling and PND.  Gastrointestinal: Positive for constipation. Negative for abdominal pain, blood in stool, diarrhea, heartburn, melena, nausea and vomiting.  Genitourinary: Negative.   Musculoskeletal: Positive for back pain, joint pain and myalgias. Negative for neck pain.  Skin: Negative.   Neurological: Positive for speech change, focal weakness and weakness. Negative for dizziness, tingling, tremors, sensory change, seizures, loss of consciousness and headaches.       Left leg weakneess  Endo/Heme/Allergies: Positive for polydipsia. Bruises/bleeds easily.  Psychiatric/Behavioral: The patient is nervous/anxious and has insomnia.     Physical Exam: BP 108/64   Pulse 88   Temp 97.7 F (36.5 C)   Resp 16   Ht 5\' 10"  (1.778 m)   Wt 77.4 kg   SpO2 100%   BMI 24.48 kg/m    Physical Exam  Constitutional: No distress. He appears chronically ill.    HENT:  Nose: Nose normal. No nasal discharge.  Mouth/Throat: No dental caries. Oropharynx is clear. Pharynx is normal.  Has one tooth, otherwise edentulous  Eyes: Pupils are equal, round, and reactive to light. Conjunctivae are normal.  Neck: Thyroid normal. No JVD present. No neck adenopathy. No thyromegaly present.  bilat carotid bruits v transmitted murmur  Cardiovascular: Regular rhythm, S1 normal, S2 normal and intact distal pulses.  Occasional extrasystoles are present.  Murmur heard. Pulmonary/Chest: Effort normal and breath sounds normal. No stridor. He has no wheezes. He has no rales. He exhibits no tenderness.  Minor crackles in the bases   Abdominal: Soft. Bowel sounds are normal. He exhibits no distension and no mass. There is no hepatomegaly. There is no abdominal tenderness.  Musculoskeletal:        General: No tenderness or edema.     Cervical back: Normal range of motion and neck supple.     Comments: bilat knee swelling  Neurological: He is alert and oriented to person, place, and time.  Generalized weakness Lower extremety weakness   Skin: Skin is warm and dry. No rash noted. No cyanosis. No jaundice or pallor. Nails show no clubbing.    Diagnostic Studies & Laboratory data:     Recent Radiology Findings:   No results found.   I have independently reviewed the above radiologic studies and discussed with the patient   Recent Lab Findings: Lab Results  Component Value Date   WBC 7.9 04/13/2020   HGB 7.5 (L) 04/13/2020   HCT 24.0 (L) 04/13/2020   PLT 383 04/13/2020   GLUCOSE 168 (H) 04/13/2020   CHOL 217 (H) 05/21/2018   TRIG 61 05/21/2018   HDL 100 05/21/2018   LDLCALC 105 (H) 05/21/2018   ALT 76 (H) 04/09/2020   AST 38 04/09/2020   NA 139 04/13/2020   K 3.9 04/13/2020   CL 101 04/13/2020   CREATININE 1.43 (H) 04/13/2020   BUN 21 04/13/2020   CO2 26 04/13/2020   TSH 4.097 04/08/2020   INR 1.8 (H) 03/20/2020   HGBA1C 6.8 (H) 02/24/2020       Assessment / Plan: Aortic root infection/endocarditis, status post previous aortic valve replacement.  Patient Active Problem List   Diagnosis Date Noted  . Prosthetic valve endocarditis ([place]) 04/13/2020  . Aortic valve abscess 04/13/2020  . Typical atrial flutter ([place])   . Discitis 04/07/2020  . Protein-calorie malnutrition, severe 03/24/2020  . Septic arthritis ([place]) 03/23/2020  . History of fusion of cervical spine 03/23/2020  . Bacteremia due  to Enterococcus 03/21/2020  . Pseudogout of left knee 03/21/2020  . ARF (acute renal failure) ([place]) 03/20/2020  . S/P aortic valve replacement 01/17/2020  . Renal insufficiency 01/17/2020  . Pre-operative cardiovascular examination 01/03/2020  . Acute otitis externa of left ear 11/13/2019  . Acute suppurative otitis media of left ear without spontaneous rupture of tympanic membrane 11/13/2019  . Conductive hearing loss of left ear 11/13/2019  . Tinnitus of left ear 11/13/2019  . Lumbar stenosis with neurogenic claudication 06/11/2019  . Cervical myelopathy ([place]) 04/22/2019  . Preoperative cardiovascular examination 04/17/2019  . Intermittent claudication ([place]) 10/31/2018  . Coronary artery disease of native artery of native heart with stable angina pectoris ([place])   . Abnormal nuclear stress test 05/03/2018  . Aortic stenosis 05/03/2018  . Acid reflux 02/15/2018  . Arthritis 02/15/2018  . Hyperactive gag reflex 02/15/2018  . Macular degeneration 02/15/2018  . Abnormal LFTs (liver function tests) 10/31/2016  . Coronary artery disease involving native coronary artery of native heart without angina pectoris 05/30/2016  . Diabetes mellitus due to underlying condition with unspecified complications ([place]) [id number]  . Dyslipidemia 05/30/2016  . Essential hypertension 05/30/2016  . PAF (paroxysmal atrial fibrillation) ([place]) 05/30/2016    Studies will be evaluated by the surgeon to determine if he is an operative candidate as well  as the timing of potential surgery.     TRANSESOPHOGEAL ECHO REPORT       Patient Name:  Johnathan Murray Date of Exam: 04/13/2020  Medical Rec #: [id number]      Height:    70.0 in  Accession #:  [id number]     Weight:    170.6 lb  Date of Birth: [birth year]      BSA:     1.951 m  Patient Age:  72 years      BP:      105/77 mmHg  Patient Gender: M          HR:      108 bpm.  Exam Location: Inpatient   Procedure: Transesophageal Echo, 3D Echo, Color Doppler and Cardiac  Doppler   Indications:   I48.91* Unspecified atrial fibrillation    History:     Patient has prior history of Echocardiogram examinations,  most          recent 03/26/2020. CAD, Arrythmias:Atrial Fibrillation;  Risk          Factors:Diabetes, Dyslipidemia and Hypertension. 80mm  Edwards          Sapien Bioprosthetic Aortic Valve implanted 08/16/16.    Sonographer:   Raquel Sarna Senior RDCS  Referring Phys: [id number] Tami Lin [facility]  Diagnosing Phys: Eleonore Chiquito MD   PROCEDURE: After discussion of the risks and benefits of a TEE, an  informed consent was obtained from the patient. TEE procedure time was 19  minutes. The transesophogeal probe was passed without difficulty through  the esophogus of the patient. Local  oropharyngeal anesthetic was provided with viscous lidocaine. Sedation  performed by different physician. The patient was monitored while under  deep sedation. Anesthestetic sedation was provided intravenously by  Anesthesiology: 260mg  of Propofol, 50mg  of  Lidocaine. Image quality was excellent. The patient's vital signs;  including heart rate, blood pressure, and oxygen saturation; remained  stable throughout the procedure. The patient developed no complications  during the procedure. A successful direct  current cardioversion was performed at 200 joules with 1 attempt.   IMPRESSIONS    1. There  is a small vegetation attached to the  leaflet of the [place] that  measures 0.8 cm x 0.5 cm. There is a pseudoaneurysm located in the  anterior aspect of the aortic root, which is actually best seen on the  supplemental TTE images. There is no  fistulous connection with the LVOT and no apparent regurgitation. The  color flow appears to be in and out of the pseudoaneurysm. 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 A cardiac CT is  recommended to better characterize the location and extent of the  pseudoaneurysm. The aortic valve has been repaired/replaced. Aortic valve  regurgitation is not visualized. Aortic valve mean gradient measures 13.0  mmHg. Aortic valve Vmax measures 2.32  m/s.  2. Left ventricular ejection fraction, by estimation, is 50 to 55%. The  left ventricle has low normal function. The left ventricle has no regional  wall motion abnormalities.  3. Right ventricular systolic function is normal. The right ventricular  size is normal.  4. No left atrial/left atrial appendage thrombus was detected. The LAA  emptying velocity was 56 cm/s.  5. The mitral valve is grossly normal. Mild mitral valve regurgitation.  No evidence of mitral stenosis.  6. There is a pseudoaneurysm of the aortic root anterior to the RCC with  clear color flow into the affected area. Aortic root abscess present.  There is mild (Grade II) layered plaque involving the descending aorta.   FINDINGS  Left Ventricle: Left ventricular ejection fraction, by estimation, is 50  to 55%. The left ventricle has low normal function. The left ventricle has  no regional wall motion abnormalities. The left ventricular internal  cavity size was normal in size.  There is no left ventricular hypertrophy. Abnormal (paradoxical) septal  motion consistent with post-operative status.   Right Ventricle: The right ventricular size is normal. No increase in  right  ventricular wall thickness. Right ventricular systolic function is  normal.   Left Atrium: Left atrial size was normal in size. No left atrial/left  atrial appendage thrombus was detected. The LAA emptying velocity was 56  cm/s.   Right Atrium: Right atrial size was normal in size.   Pericardium: Trivial pericardial effusion is present.   Mitral Valve: The mitral valve is grossly normal. There is mild thickening  of the mitral valve leaflet(s). Mild mitral valve regurgitation. No  evidence of mitral valve stenosis. There is no evidence of mitral valve  vegetation.   Tricuspid Valve: The tricuspid valve is grossly normal. Tricuspid valve  regurgitation is trivial. No evidence of tricuspid stenosis. There is no  evidence of tricuspid valve vegetation.   Aortic Valve: There is a small vegetation attached to the leaflet of the  [place] that measures 0.8 cm x 0.5 cm. There is a pseudoaneurysm located in  the anterior aspect of the aortic root, which is actually best seen on the  supplemental TTE images. There is  no fistulous connection with the LVOT and no apparent regurgitation. The  color flow appears to be in and out of the pseudoaneurysm. 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 A cardiac CT is  recommended to better characterize the location and extent of the  pseudoaneurysm. The aortic valve has been repaired/replaced. Aortic valve  regurgitation is not visualized. Aortic valve mean gradient measures 13.0  mmHg. Aortic valve peak gradient  measures 21.5 mmHg. Aortic valve area, by VTI measures 1.79 cm. There is  a 23 mm bioprosthetic valve present  in the aortic position.   Pulmonic Valve: The pulmonic valve was grossly normal. Pulmonic valve  regurgitation is not visualized. No evidence of pulmonic stenosis.   Aorta: There is a pseudoaneurysm of the aortic root anterior to the RCC  with clear color flow into the  affected area. Root abscess present. There  is mild (Grade II) layered plaque involving the descending aorta.   IAS/Shunts: No atrial level shunt detected by color flow Doppler.     LEFT VENTRICLE  PLAX 2D  LVOT diam:   2.10 cm  LV SV:     62  LV SV Index:  32  LVOT Area:   3.46 cm     AORTIC VALVE  AV Area (VTI):   1.79 cm  AV Vmax:      232.08 cm/s  AV Vmean:     172.165 cm/s  AV VTI:      0.349 m  AV Peak Grad:   21.5 mmHg  AV Mean Grad:   13.0 mmHg  LVOT VTI:     0.180 m  LVOT/AV VTI ratio: 0.52    AORTA  Ao Asc diam: 3.20 cm   TRICUSPID VALVE  TR Peak grad:  17.5 mmHg  TR Vmax:    209.00 cm/s    SHUNTS  Systemic VTI: 0.18 m  Systemic Diam: 2.10 cm   Eleonore Chiquito MD  Electronically signed by Eleonore Chiquito MD  Signature Date/Time: 04/13/2020/2:40:09 PM      Physicians Panel Physicians Referring Physician Case Authorizing Physician  Belva Crome, MD (Primary)       Procedures LEFT HEART CATH AND CORONARY ANGIOGRAPHY     Conclusion Aortic bioprosthesis was not crossed and therefore hemodynamics in the LV were not recorded.  Patent LIMA to the LAD.  Occluded saphenous vein graft to the marginal.  Occluded saphenous vein graft to the diagonal.  Total occlusion of the mid to proximal LAD.  Patent circumflex with 50% proximal stenosis and segmental diffuse disease extending into the large obtuse marginal.  RCA is dominant. There is diffuse irregularity noted throughout the proximal to distal vessel with up to 30% in the mid vessel. RECOMMENDATIONS:  Continue current therapy. No change in anatomy is noted compared to the prior angiogram performed in 2019.  The abnormal nuclear study with anterolateral ischemia will be a chronic finding and in absence of symptoms does not warrant repeat invasive investigation.        Surgeon Notes   01/21/2020 1:16 PM CV Procedure signed by Belva Crome, MD       Indications Abnormal nuclear cardiac imaging test [R93.1 (ICD-10-CM)]     Procedural Details Technical Details The left radial area was sterilely prepped and draped. Intravenous sedation with Versed and fentanyl was administered. 1% Xylocaine was infiltrated to achieve local analgesia. Using real-time vascular ultrasound, a double wall stick with an angiocath was utilized to obtain intra-arterial access. A VUS image was saved for the record.The modified Seldinger technique was used to place a 37F " Slender" sheath in the left radial artery. Weight based heparin was administered. Coronary angiography was done using 5 F catheters. Right coronary angiography was performed with a JR4. Left ventricular hemodymics were not recorded as the prosthetic valve was not crossed. Left coronary angiography was performed with a JL 3.5 cm. The internal mammary artery was visualized with the Judkins right catheter.  Hemostasis was achieved using a pneumatic band.  During this procedure the patient is administered a total of Versed 1  mg and Fentanyl 25 mcg to achieve and maintain moderate conscious sedation. The patient's heart rate, blood pressure, and oxygen saturation are monitored continuously during the procedure. The period of conscious sedation is 23 minutes, of which I was present face-to-face 100% of this time. Estimated blood loss <50 mL.   During this procedure medications were administered to achieve and maintain moderate conscious sedation while the patient's heart rate, blood pressure, and oxygen saturation were continuously monitored and I was present face-to-face 100% of this time.     Medications (Filter: Administrations occurring from 01/21/20 1200 to 01/21/20 1314)  Continuous medications are totaled by the amount administered until 01/21/20 1314.  Radial Cocktail/Verapamil only (mL) Total volume: 10 mL  Date/Time   Rate/Dose/Volume Action  01/21/20 1246  10 mL Given  heparin injection  (Units) Total dose: 4,500 Units  Date/Time   Rate/Dose/Volume Action  01/21/20 1251  4,500 Units Given  midazolam (VERSED) injection (mg) Total dose: 1 mg  Date/Time   Rate/Dose/Volume Action  01/21/20 1240  1 mg Given  fentaNYL (SUBLIMAZE) injection (mcg) Total dose: 25 mcg  Date/Time   Rate/Dose/Volume Action  01/21/20 1240  25 mcg Given  Heparin (Porcine) in NaCl 1000-0.9 UT/500ML-% SOLN (mL) Total volume: 1,000 mL  Date/Time   Rate/Dose/Volume Action  01/21/20 1311  500 mL Given  1311  500 mL Given  lidocaine (PF) (XYLOCAINE) 1 % injection (mL) Total volume: 5 mL  Date/Time   Rate/Dose/Volume Action  01/21/20 1244  5 mL Given  iohexol (OMNIPAQUE) 350 MG/ML injection (mL) Total volume: 70 mL  Date/Time   Rate/Dose/Volume Action  01/21/20 1245  70 mL Given  Heparin (Porcine) in NaCl 1000-0.9 UT/500ML-% SOLN (mL) Total volume: 500 mL  Date/Time   Rate/Dose/Volume Action  01/21/20 1245  500 mL Given     Sedation Time Sedation Time Physician-1: 23 minutes 32 seconds        Contrast Medication Name Total Dose  iohexol (OMNIPAQUE) 350 MG/ML injection 70 mL     Radiation/Fluoro Fluoro time: 5.4 (min)  DAP: 13.2 (Gycm2)  Cumulative Air Kerma: 249.5 (mGy)        Coronary Findings Diagnostic Dominance: Right  Left Anterior Descending  Vessel is large. The vessel exhibits minimal luminal irregularities.  Prox LAD lesion 99% stenosed  Prox LAD lesion is 99% stenosed.  Mid LAD lesion 30% stenosed  Mid LAD lesion is 30% stenosed. The lesion was previously treated using a drug eluting stent between 1-2 years ago.   First Diagonal Branch  Ost 1st Diag lesion 100% stenosed  Ost 1st Diag lesion is 100% stenosed. The lesion is chronically occluded.  Ost 1st Diag to 1st Diag lesion 100% stenosed  Ost 1st Diag to 1st Diag lesion is 100% stenosed. The lesion is chronically occluded. The lesion was previously treated.   Left Circumflex  The vessel exhibits  minimal luminal irregularities.  Prox Cx to Mid Cx lesion 50% stenosed  Prox Cx to Mid Cx lesion is 50% stenosed.   First Obtuse Marginal Branch  Vessel is small in size.   Second Obtuse Marginal Branch  Vessel is moderate in size.   Third Obtuse Marginal Branch  Vessel is small in size.  Ost 3rd Mrg lesion 70% stenosed  Ost 3rd Mrg lesion is 70% stenosed.   Right Coronary Artery  There is moderate diffuse disease throughout the vessel.   Saphenous Graft To 2nd Mrg  SVG due to known occlusion.  Origin to Prox Graft lesion 100% stenosed  Origin to Prox Graft lesion is 100% stenosed. The lesion is chronically occluded.   Saphenous Graft To Ost 1st Diag  SVG due to known occlusion.  Origin lesion 100% stenosed  Origin lesion is 100% stenosed. The lesion is chronically occluded.   LIMA LIMA Graft To Mid LAD  LIMA and is normal in caliber.  Intervention No interventions have been documented.                            Coronary Diagrams Diagnostic Dominance: Right  &&&&&  Intervention   I  spent 60 minutes counseling the patient face to face.   John Giovanni, PA-C 04/13/2020 1:58 PM   Chart reviewed, patient examined, agree with above.   This 69 year old gentleman underwent aortic valve replacement with a 23 mm Edwards pericardial valve and coronary bypass graft surgery x3 in September 2017 at [facility] for stage C asymptomatic severe aortic stenosis.  He had a catheterization here in June 2019 which documented occlusion of both vein grafts at that time with a patent left internal mammary graft to the LAD.  He had a nuclear stress test on in February 2021 in anticipation of spine surgery and this was abnormal showing anterior lateral ischemia.  He subsequently underwent cardiac catheterization on 01/21/2020 which was essentially unchanged from his prior catheterization 2019 with known occlusion of both vein grafts to the diagonal and obtuse marginal and  a patent left internal mammary graft to the LAD.  The right coronary was a dominant vessel without significant disease.  The left circumflex had mild to moderate proximal to mid vessel stenosis of 50% at most.  He subsequently underwent C3-4 anterior cervical discectomy by [name] on 03/02/2020.  He reports an uneventful recovery but then developed progressive weakness, fever, malaise, and was admitted with sepsis and acute renal failure with a creatinine of 6.5 compared to his normal baseline of 1.5-1.6.  He was found to have enterococcal bacteremia and blood cultures cleared on 03/23/2020.  He had a TEE performed on 03/26/2020 which was negative.  He subsequently developed swelling in both knees and was found to have septic arthritis requiring bilateral knee washouts.  He was discharged to inpatient rehab on ampicillin but continued to have some fevers.  He began complaining of worsening back pain underwent MRI of the cervical and lumbar spine on 513 and was found to have L4-5 discitis/osteomyelitis with associated soft tissue infection within the psoas musculature bilaterally with small abscesses present.  There is edema throughout the posterior paraspinous musculature felt to possibly be myositis.  He had previous bilateral L4-5 decompressive laminotomies and foraminotomies in July 2020 by [name].  He underwent a repeat TEE today which showed a new small vegetation measuring 0.8 x 0.5 cm on the noncoronary cusp of the prosthetic valve.  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  The color flow appeared to be in and out of the pseudoaneurysm.  There are no signs of infection on the tricuspid or mitral valve.  Left and right heart function was normal.  He has been hemodynamically stable.  He had a fever to 101.9 last night.  He is afebrile at this time.  White blood cell count this morning  was 7.9.  Hemoglobin was 7.5.  Creatinine was 1.43.  Electrocardiogram shows sinus rhythm with no  evidence of heart block.  He has been on Eliquis for atrial fibrillation and that was stopped this afternoon.  With development of prosthetic valve vegetation and probable perivalvular abscess I think he will require redo aortic valve replacement and probably aortic root replacement using a homograft.  I will review his TEE with my colleagues on the heart valve team and decide about the utility of a gated cardiac CTA to better assess the perivalvular area.  His Eliquis was just stopped this afternoon and ideally he should be off of that for 4 to 5 days before proceeding with this type of major cardiac surgery with a high risk of bleeding.  I reviewed all of the above with the patient and his wife and answered their questions.  We will make a decision tomorrow morning about performing a gated cardiac CTA.

## 2020-04-13 NOTE — Telephone Encounter (Signed)
Jeri Lager, NP from Infectious Disease called wanting to speak to Dr. Ranell Patrick or Dr. Dagoberto Ligas about the plan for the patient. Dr. Dagoberto Ligas is off today.

## 2020-04-13 NOTE — Progress Notes (Addendum)
Echocardiogram Echocardiogram Transesophageal has been performed.  [name] 04/13/2020, 11:04 AM

## 2020-04-13 NOTE — H&P (Deleted)
  The note originally documented on this encounter has been moved the the encounter in which it belongs.  

## 2020-04-13 NOTE — Progress Notes (Signed)
Occupational Therapy Note  Patient Details  Name: Johnathan Murray MRN: [id number] Date of Birth: 1951-05-08  Today's Date: 04/13/2020 OT Missed Time: 60 Minutes Missed Time Reason: Other (comment)(Cardiac procedure this am)  Pt greeted asleep in bed with wife also asleep in recliner. Pt easy to wake, but declines therapy this morning 2/2 wanting to rest up for cardiac procedure he is having today. Pt left semi-reclined in bed with needs met.   Daneen Schick Samiyah Stupka 04/13/2020, 7:46 AM

## 2020-04-13 NOTE — CV Procedure (Signed)
   TRANSESOPHAGEAL ECHOCARDIOGRAM GUIDED DIRECT CURRENT CARDIOVERSION  NAME:  Johnathan Murray    MRN: [id number] DOB:  1951-04-23    ADMIT DATE: 04/07/2020  INDICATIONS: Symptomatic atrial fibrillation/bacteremia   PROCEDURE:   Informed consent was obtained prior to the procedure. The risks, benefits and alternatives for the procedure were discussed and the patient comprehended these risks.  Risks include, but are not limited to, cough, sore throat, vomiting, nausea, somnolence, esophageal and stomach trauma or perforation, bleeding, low blood pressure, aspiration, pneumonia, infection, trauma to the teeth and death.    After a procedural time-out, the oropharynx was anesthetized and the patient was sedated by the anesthesia service. The transesophageal probe was inserted in the esophagus and stomach without difficulty and multiple views were obtained. Anesthesia was monitored by Dr. Royce Macadamia.   COMPLICATIONS:    Complications: No complications Patient tolerated procedure well.  KEY FINDINGS:  1. Vegetation ~5 mm x 8 mm on leaflet of RCC of prosthetic valve.  2. Aortic root abscess present with color flow on TEE and supplemental TTE.  3. Normal LVEF, 60%.  4. Full Report to follow.   CARDIOVERSION:     Indications:  Symptomatic Atrial Fibrillation  Procedure Details:  Once the TEE was complete, the patient had the defibrillator pads placed in the anterior and posterior position. Once an appropriate level of sedation was confirmed, the patient was cardioverted x 1 with 200J of biphasic synchronized energy.  The patient converted to NSR.  There were no apparent complications.  The patient had normal neuro status and respiratory status post procedure with vitals stable as recorded elsewhere.  Adequate airway was maintained throughout and vital signs monitored per protocol.  [place] Bells T. Audie Box, [place]  [address] [place], [place] [address] [phone]  10:48 AM

## 2020-04-13 NOTE — Anesthesia Postprocedure Evaluation (Signed)
Anesthesia Post Note  Patient: Johnathan Murray  Procedure(s) Performed: TRANSESOPHAGEAL ECHOCARDIOGRAM (TEE) (N/A ) CARDIOVERSION (N/A )     Patient location during evaluation: PACU Anesthesia Type: General Level of consciousness: awake and alert and oriented Pain management: pain level controlled Vital Signs Assessment: post-procedure vital signs reviewed and stable Respiratory status: spontaneous breathing, nonlabored ventilation and respiratory function stable Cardiovascular status: blood pressure returned to baseline and stable Postop Assessment: no apparent nausea or vomiting Anesthetic complications: no    Last Vitals:  Vitals:   04/13/20 1055 04/13/20 1105  BP: (!) 95/44 (!) 97/49  Pulse: 81 81  Resp: 17 14  Temp:    SpO2: 100% 96%    Last Pain:  Vitals:   04/13/20 1105  TempSrc:   PainSc: 0-No pain                 Yul Diana,[place] A.

## 2020-04-13 NOTE — Progress Notes (Signed)
ANTICOAGULATION CONSULT NOTE - Initial Consult  Pharmacy Consult for IV Heparin Indication: atrial fibrillation  Allergies  Allergen Reactions  . Atorvastatin Other (See Comments)    Urinary retention   . Nebivolol Diarrhea    Patient Measurements: Weight: 81.3 kg (179 lb 3.7 oz) Heparin Dosing Weight: 81.3 kg (using updated wt on transfer back to acute side)  Vital Signs: Temp: 99 F (37.2 C) (05/24 1942) Temp Source: Oral (05/24 1942) BP: 97/66 (05/24 1942) Pulse Rate: 95 (05/24 1942)  Labs: Recent Labs    04/13/20 0418  HGB 7.5*  HCT 24.0*  PLT 383  CREATININE 1.43*    Estimated Creatinine Clearance: 51.8 mL/min (A) (by C-G formula based on SCr of 1.43 mg/dL (H)).   Medical History: Past Medical History:  Diagnosis Date  . Abnormal LFTs (liver function tests) 10/31/2016  . Abnormal nuclear stress test 05/03/2018  . Acid reflux 02/15/2018   not at present  . Acute otitis externa of left ear 11/13/2019  . Acute suppurative otitis media of left ear without spontaneous rupture of tympanic membrane 11/13/2019  . Anemia    low iron  . Aortic stenosis 05/03/2018  . Arthritis 02/15/2018   Overview:  shoulder  . Cancer ([place])    skin cancer on left wrist  . Cervical myelopathy ([place]) 04/22/2019  . Conductive hearing loss of left ear 11/13/2019  . Coronary artery disease involving native coronary artery of native heart without angina pectoris 05/30/2016  . Coronary artery disease of native artery of native heart with stable angina pectoris ([facility])    pt. denies  . Diabetes mellitus due to underlying condition with unspecified complications ([place]) [id number]  . Dyslipidemia 05/30/2016  . Dyspnea    upon exertion,  as of 06/05/09 not having any recent chest pain  . Dysrhythmia    a fib  . Essential hypertension 05/30/2016  . Heart murmur    Has had an Aortic valve replacement  . Hyperactive gag reflex 02/15/2018  . Hypothyroidism   . Intermittent claudication ([place])  10/31/2018  . Lumbar stenosis with neurogenic claudication 06/11/2019  . Macular degeneration 02/15/2018  . PAF (paroxysmal atrial fibrillation) ([place]) 05/30/2016  . Pre-operative cardiovascular examination 01/03/2020  . Preoperative cardiovascular examination 04/17/2019  . Pseudogout of left knee 03/21/2020  . Renal insufficiency 01/17/2020  . S/P aortic valve replacement 01/17/2020  . 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 Pt had TEE-DCCV today (04/13/20), which revealed evolved aortic root abscess with vegetations on leaflet of prosthetic valve. Pharmacy is consulted to transition pt from apixaban to IV heparin, pending evaluation for OR in the near future.  H/H 7.5/24.0, platelets 383 (CBC stable); TBW CrCl ~55 ml/min. Baseline aPTT, heparin level pending; expect heparin level to be elevated, due to recent apixaban therapy. Will monitor anticoagulation using aPTT until aPTT and heparin levels correlated. Will plan to start IV heparin (no bolus) ~12 hrs after last apixaban dose.    Goal of Therapy:  aPTT 66-102 sec Heparin level 0.3-0.7 units/ml Monitor platelets by anticoagulation protocol: Yes   Plan:  Start heparin infusion at 1100 units/hr at ~2100 tonight (~12 hrs after last dose of apixaban) Check aPTT, heparin level ~6 hrs after starting heparin infusion Monitor daily aPTT, heparin level, CBC Monitor for  signs/symptoms of bleeding F/U plans for surgery  Gillermina Hu, PharmD, BCPS, [facility] Clinical Pharmacist 04/13/2020,7:56 PM

## 2020-04-13 NOTE — Consult Note (Signed)
  Went to see Johnathan Murray today but he was being prepaired and taken down for TEE cardioversion and was unable to see.

## 2020-04-13 NOTE — Progress Notes (Signed)
ANTICOAGULATION CONSULT NOTE - Initial Consult  Pharmacy Consult for IV Heparin Indication: atrial fibrillation  Allergies  Allergen Reactions  . Atorvastatin Other (See Comments)    Urinary retention   . Nebivolol Diarrhea    Patient Measurements: Height: 5\' 10"  (177.8 cm) Weight: 77.4 kg (170 lb 10.2 oz) IBW/kg (Calculated) : 73 Heparin Dosing Weight: 75.2 kg  Vital Signs: Temp: 99.7 F (37.6 C) (05/24 1618) Temp Source: Oral (05/24 1047) BP: 97/57 (05/24 1618) Pulse Rate: 92 (05/24 1618)  Labs: Recent Labs    04/13/20 0418  HGB 7.5*  HCT 24.0*  PLT 383  CREATININE 1.43*    Estimated Creatinine Clearance: 51.8 mL/min (A) (by C-G formula based on SCr of 1.43 mg/dL (H)).   Medical History: Past Medical History:  Diagnosis Date  . Abnormal LFTs (liver function tests) 10/31/2016  . Abnormal nuclear stress test 05/03/2018  . Acid reflux 02/15/2018   not at present  . Acute otitis externa of left ear 11/13/2019  . Acute suppurative otitis media of left ear without spontaneous rupture of tympanic membrane 11/13/2019  . Anemia    low iron  . Aortic stenosis 05/03/2018  . Arthritis 02/15/2018   Overview:  shoulder  . Cancer ([place])    skin cancer on left wrist  . Cervical myelopathy ([place]) 04/22/2019  . Conductive hearing loss of left ear 11/13/2019  . Coronary artery disease involving native coronary artery of native heart without angina pectoris 05/30/2016  . Coronary artery disease of native artery of native heart with stable angina pectoris ([place])    pt. denies  . Diabetes mellitus due to underlying condition with unspecified complications ([place]) [id number]  . Dyslipidemia 05/30/2016  . Dyspnea    upon exertion,  as of 06/05/09 not having any recent chest pain  . Dysrhythmia    a fib  . Essential hypertension 05/30/2016  . Heart murmur    Has had an Aortic valve replacement  . Hyperactive gag reflex 02/15/2018  . Hypothyroidism   . Intermittent claudication ([place])  10/31/2018  . Lumbar stenosis with neurogenic claudication 06/11/2019  . Macular degeneration 02/15/2018  . PAF (paroxysmal atrial fibrillation) ([place]) 05/30/2016  . Pre-operative cardiovascular examination 01/03/2020  . Preoperative cardiovascular examination 04/17/2019  . Pseudogout of left knee 03/21/2020  . Renal insufficiency 01/17/2020  . S/P aortic valve replacement 01/17/2020  . 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 Pt had TEE-DCCV today (04/13/20), which revealed evolved aortic root abscess with vegetations on leaflet of prosthetic valve. Pharmacy is consulted to transition pt from apixaban to IV heparin, pending evaluation for OR in the near future.  H/H 7.5/24.0, platelets 383 (CBC stable); TBW CrCl ~55 ml/min. Baseline aPTT, heparin level pending; expect heparin level to be elevated, due to recent apixaban therapy. Will monitor anticoagulation using aPTT until aPTT and heparin levels correlated. Will plan to start IV heparin (no bolus) ~12 hrs after last apixaban dose.    Goal of Therapy:  aPTT 66-102 sec Heparin level 0.3-0.7 units/ml Monitor platelets by anticoagulation protocol: Yes   Plan:  Start heparin infusion at 1050 units/hr at ~2100 tonight (~12 hrs after last dose of apixaban) Check aPTT, heparin level ~6 hrs after starting heparin infusion Monitor daily aPTT, heparin level, CBC Monitor for  signs/symptoms of bleeding F/U plans for surgery  Gillermina Hu, PharmD, BCPS, [facility] Clinical Pharmacist 04/13/2020,4:56 PM

## 2020-04-13 NOTE — Progress Notes (Signed)
[place] PHYSICAL MEDICINE & REHABILITATION PROGRESS NOTE   Subjective/Complaints: Patient is anxious about his procedure this morning. Also anxious about his worsening condition and prognosis. Pain has been severe in lower back.  Sleeping poorly at night despite Trazodone.   ROS: Pt denies SOB, abd pain, CP, N/V/C/D, and vision changes  Objective:   No results found. Recent Labs    04/13/20 0418  WBC 7.9  HGB 7.5*  HCT 24.0*  PLT 383   Recent Labs    04/13/20 0418  NA 139  K 3.9  CL 101  CO2 26  GLUCOSE 168*  BUN 21  CREATININE 1.43*  CALCIUM 8.8*    Intake/Output Summary (Last 24 hours) at 04/13/2020 1109 Last data filed at 04/13/2020 1042 Gross per 24 hour  Intake 2077 ml  Output --  Net 2077 ml     Physical Exam: Vital Signs Blood pressure (!) 97/49, pulse 81, temperature 97.9 F (36.6 C), temperature source Oral, resp. rate 14, height 5\' 10"  (1.778 m), weight 77.4 kg, SpO2 96 %.  Physical Exam Constitutional:pt alone; laying in bed supine, wife at bedside.  HENT:  Conjugate gaze Cardiovascular:Afib with RVR- rate 120s-130s Respiratory: CTA B/L- no W/R/R- good air movement [id number], NT, ND, (+)BS  Musculoskeletal:  Cervical back: Normal range of motion.  Comments: Both knees still swollen and somewhat tender- L knee still swollen, however still less TTP- suprapatella bursitis noted-  TTP over low back mainly in midline Neurological: Ox3 UE motor 5/5. LE 3-/5 prox (pain inhibition from back/knees) to 4/5 with ADF/PF. No focal sensory loss. DTR's 1+ Skin: Skin iswarm. He isnot diaphoretic. Spine assessed and no erythema at incision site, +tenderness Psychiatric:sleepy    Assessment/Plan: 1. Functional deficits secondary to Discitis/Osteomyelitis  which require 3+ hours per day of interdisciplinary therapy in a comprehensive inpatient rehab setting.  Physiatrist is providing close team supervision and 24 hour management of active  medical problems listed below.  Physiatrist and rehab team continue to assess barriers to discharge/monitor patient progress toward functional and medical goals  Care Tool:  Bathing    Body parts bathed by patient: Right arm, Left arm, Chest, Abdomen, Front perineal area, Right upper leg, Left upper leg, Face   Body parts bathed by helper: Right lower leg, Buttocks, Left lower leg     Bathing assist Assist Level: Moderate Assistance - Patient 50 - 74%     Upper Body Dressing/Undressing Upper body dressing Upper body dressing/undressing activity did not occur (including orthotics): N/A What is the patient wearing?: Pull over shirt    Upper body assist Assist Level: Minimal Assistance - Patient > 75%    Lower Body Dressing/Undressing Lower body dressing    Lower body dressing activity did not occur: N/A What is the patient wearing?: Pants     Lower body assist Assist for lower body dressing: Total Assistance - Patient < 25%     Toileting Toileting    Toileting assist Assist for toileting: Maximal Assistance - Patient 25 - 49%     Transfers Chair/bed transfer  Transfers assist  Chair/bed transfer activity did not occur: N/A  Chair/bed transfer assist level: Moderate Assistance - Patient 50 - 74%(scoot pivot)     Locomotion Ambulation   Ambulation assist      Assist level: Moderate Assistance - Patient 50 - 74% Assistive device: Walker-rolling Max distance: 5'   Walk 10 feet activity   Assist  Walk 10 feet activity did not occur: Safety/medical concerns  Walk 50 feet activity   Assist Walk 50 feet with 2 turns activity did not occur: Safety/medical concerns         Walk 150 feet activity   Assist Walk 150 feet activity did not occur: Safety/medical concerns         Walk 10 feet on uneven surface  activity   Assist Walk 10 feet on uneven surfaces activity did not occur: Safety/medical concerns          Wheelchair     Assist Will patient use wheelchair at discharge?: Yes(pending progress and pain management) Type of Wheelchair: Manual    Wheelchair assist level: Supervision/Verbal cueing Max wheelchair distance: 150'    Wheelchair 50 feet with 2 turns activity    Assist        Assist Level: Supervision/Verbal cueing   Wheelchair 150 feet activity     Assist      Assist Level: Supervision/Verbal cueing   Blood pressure (!) 97/49, pulse 81, temperature 97.9 F (36.6 C), temperature source Oral, resp. rate 14, height 5\' 10"  (1.778 m), weight 77.4 kg, SpO2 96 %.  Medical Problem List and Plan: 1.Decreased functional mobilitysecondary to sepsis/Enterococcusfaecalisbacteremia with septic knees and lumbar discitis/osteomyelitis -patient may shower -ELOS/Goals: 12-14 days, supervision to mod I  -Hold therapy  2. Antithrombotics: -DVT/anticoagulation:Eliquis 5mg  BID -antiplatelet therapy: N/A 3. Pain Management:Flexeril as needed, oxycodone as needed -heat, kpad as appropriate, lumbar corset, ice for knees. -ROM, core strengthening with therapies  5/20- added Oxycontin 10 mg BID, increased Lyrica to 100 mg BID and changed Flexeril to 5 mg TID prn for muscle spasms, since so sedating.   5/21- pain better controlled  5/22- sounds like got behind on pain meds yesterday- will increase oxycodone to 10-15 mg Q4 hours prn- explained could make more sleepy- increased lyrica to 100 mg TID  5/24: continues to have significant spinal pain. Lyrica recently increased. Continue to monitor.  4. Mood:Trazodone as needed. Increase to 100mg  as is continuing to have insomnia.  -antipsychotic agents: N/C 5. Neuropsych: This patientiscapable of making decisions on hisown behalf. 6. Skin/Wound Care:Routine skin checks 7. Fluids/Electrolytes/Nutrition:Routine in and outs with follow-up chemistries 8.  Acute osteomyelitis/discitis L4-5. Neurosurgery follow-up no surgical intervention.Lumbar corsetfor comfort.  -S/p FNA biopsy L4-5 disc today.Await aspiration results  5/20- ID put pt also on Rocephin as well   9.Recent C3-4 ACDF 03/02/2020 per [name] for cervical myelopathy. Cervical collar has been discontinued. 10. Septic knee. Status post aspiration and steroid injection with I&D of bilateral knees 03/24/2020. Follow-up orthopedic services  5/20- they attempted another aspiration- not real effective per pt- still has suprapatellar bursitis type swelling  5/22- Cx' no growth so far.  11. ID/Enterococcus bacteremia. Continue intravenous ampicillin through 05/20/2020. Follow-up infectious disease  5/20- on Ampicillin AND now Rocephin til 05/20/20  5/21- back Cx begative/not final  5/22- no change per ID note-   5/23- Had Tm of 100.5 last night- MEWS of 5 as a result with Afib- U/A and Cx sent- U/A is negative- plan for TEE looking for vegetations tomorrow with cardioversion- back Cx reincubating.  5/24: Spoke with ID NP regarding finding of thoracic root abscess on TEE today; transfer to cardiology for telemetry monitoring.  12. PAF/bioprosthetic aortic valve replacement 2017. Await plan to resume Eliquis. Amiodarone currently on hold  5/19- Back in Afib with RVR- called Cards to see if can restart Amiodarone.   5/20- restarted Amio at 400 mg BID- rate still 130s-140  5/22- rate much lower- was  30s-40-s yesterday- however in 80s-90s rate today- changed amio to Qday but also paged Cards.  5/23- changed back to 400 mg BID since rate back up   5/24: cardioversion today 13. AKI. Follow-up chemistries  5/24: Cr is 1.43; stable 14. Diabetes mellitus. Hemoglobin A1c 6.8. Currently on NovoLog 3 units 3 times daily as well as Lantus insulin 10 units twice daily. Patient on Glucophage prior to admission 500 mg nightly. Currently on hold due to AKI. Titrate regime to  effect CBG (last 3)  Recent Labs    04/12/20 1741 04/12/20 2110 04/13/20 0605  GLUCAP 95 142* 151*    5/23- BGs well controlled- con't meds  5/24: CBGs well controlled 15. Acute on chronic anemia. Continue Niferex.   5/24: Hgb is 7.5; stable 16. Hypothyroidism. Synthroid 17. Nerve pain in RLE- will add Lyrica 50 mg BID and monitor   5/20- increased to 100 mg BID  5/22- increased lyrica to 100 mg TID 18, Insomnia  5/23- change trazodone to 75 mg QHS scheduled  5/24: increased trazodone to 100mg  HS for better sleep.  >30 minutes spent in discussion with patient and wife about his pain, insomnia, cardioversion; in discussion with rehabilitation PA and ID NP regarding thoracic root abscess, and in review of labs.    LOS: 6 days A FACE TO FACE EVALUATION WAS PERFORMED  Mauri Temkin P Daisuke Bailey 04/13/2020, 11:09 AM

## 2020-04-13 NOTE — Progress Notes (Signed)
Subjective: Patient states knee pain has been improving, but continues to have severe low back pain.   Objective:  PE: VITALS:   Vitals:   04/13/20 1047 04/13/20 1055 04/13/20 1105 04/13/20 1126  BP: (!) 77/45 (!) 95/44 (!) 97/49 108/64  Pulse: 81 81 81 88  Resp: 13 17 14 16   Temp: 97.9 F (36.6 C)   97.7 F (36.5 C)  TempSrc: Oral     SpO2: 100% 100% 96% 100%  Weight:      Height:        MSK: 0-95 degrees of flexion of bilateral knees while supine in bed. Significant edema at distal femur bilaterally - left worse than right. EHL and FHL intact bilaterally. 2+ DP pulses. Distal sensation intact bilaterally. Arthroscopic surgical sites healing, no drainage. No TTP or edema noted on palpation of prepatellar bursae.   LABS  Results for orders placed or performed during the hospital encounter of 04/07/20 (from the past 24 hour(s))  Glucose, capillary     Status: None   Collection Time: 04/12/20  5:41 PM  Result Value Ref Range   Glucose-Capillary 95 70 - 99 mg/dL  Glucose, capillary     Status: Abnormal   Collection Time: 04/12/20  9:10 PM  Result Value Ref Range   Glucose-Capillary 142 (H) 70 - 99 mg/dL  CBC WITH DIFFERENTIAL     Status: Abnormal   Collection Time: 04/13/20  4:18 AM  Result Value Ref Range   WBC 7.9 4.0 - 10.5 K/uL   RBC 2.55 (L) 4.22 - 5.81 MIL/uL   Hemoglobin 7.5 (L) 13.0 - 17.0 g/dL   HCT 24.0 (L) 39.0 - 52.0 %   MCV 94.1 80.0 - 100.0 fL   MCH 29.4 26.0 - 34.0 pg   MCHC 31.3 30.0 - 36.0 g/dL   RDW 15.9 (H) 11.5 - 15.5 %   Platelets 383 150 - 400 K/uL   nRBC 0.0 0.0 - 0.2 %   Neutrophils Relative % 74 %   Neutro Abs 5.9 1.7 - 7.7 K/uL   Lymphocytes Relative 16 %   Lymphs Abs 1.2 0.7 - 4.0 K/uL   Monocytes Relative 6 %   Monocytes Absolute 0.5 0.1 - 1.0 K/uL   Eosinophils Relative 2 %   Eosinophils Absolute 0.2 0.0 - 0.5 K/uL   Basophils Relative 1 %   Basophils Absolute 0.0 0.0 - 0.1 K/uL   Immature Granulocytes 1 %   Abs Immature  Granulocytes 0.06 0.00 - 0.07 K/uL  Basic metabolic panel     Status: Abnormal   Collection Time: 04/13/20  4:18 AM  Result Value Ref Range   Sodium 139 135 - 145 mmol/L   Potassium 3.9 3.5 - 5.1 mmol/L   Chloride 101 98 - 111 mmol/L   CO2 26 22 - 32 mmol/L   Glucose, Bld 168 (H) 70 - 99 mg/dL   BUN 21 8 - 23 mg/dL   Creatinine, Ser 1.43 (H) 0.61 - 1.24 mg/dL   Calcium 8.8 (L) 8.9 - 10.3 mg/dL   GFR calc non Af Amer 50 (L) >60 mL/min   GFR calc Af Amer 58 (L) >60 mL/min   Anion gap 12 5 - 15  Glucose, capillary     Status: Abnormal   Collection Time: 04/13/20  6:05 AM  Result Value Ref Range   Glucose-Capillary 151 (H) 70 - 99 mg/dL  Glucose, capillary     Status: None   Collection Time: 04/13/20 11:49 AM  Result Value Ref Range   Glucose-Capillary 72 70 - 99 mg/dL    No results found.  Assessment/Plan: Principal Problem:   Prosthetic valve endocarditis (HCC) Active Problems:   Bacteremia due to Enterococcus   Discitis   Typical atrial flutter (HCC)   Aortic valve abscess  Bilateral knee pain, septic arthritis s/p bilateral knee irrigation and debridement - 5/1 - left knee aspiration performed which showedintracellular calcium pyrophosphate crystals with white blood cell count approx. 15,000 -consistent with pseudogout - 5/2 - right knee aspirated due to report of right knee pain and swelling, and bilateral knees received cortisone injection due to results above - this aspiration showed intracellular calcium pyrophosphate crystals and white blood cell count approx. 19,000 - 5/3 - left knee culture grewout Enterococcus faecalis, no growth from right knee aspirate  - 5/2 - bilateral knee irrigation and debridement performed - 5/19 knee aspirated again due to left knee swelling, only able to aspirate 4 ml  - final results of this aspiration show no organisms, white blood cell count approx. 26,000 and intra and extra cellular calcium pyrophosphate crystals - continue to WBAT  bilateral knees, arthroscopic incisions appear to be healing well  - no second irrigation and debridement indicated at this time - will continue to follow intermittently   Ventura Bruns 04/13/2020, 2:19 PM    Contact information:   Weekdays [address] [place] [phone] A fter hours and holidays please check [email or web address] for group call information for [facility]

## 2020-04-13 NOTE — Progress Notes (Signed)
Progress Note  Patient Name: Johnathan Murray Date of Encounter: 04/13/2020  Primary Cardiologist: Jenean Lindau, MD   Subjective   Patient seen post TEE-CV. ID team in the room during my arrival, were discussing results of TEE with him. I also reviewed TEE results. He feels somewhat better in sinus rhythm (by auscultation--not on telemetry). He is concerned about the potential need for surgery due to the abscess around his valve.  Denies chest pain. Still fatigues easily with minimal exertion. Continues to have intermittent fevers, chills, and rigors.  Inpatient Medications    Scheduled Meds: . amiodarone  400 mg Oral BID  . apixaban  5 mg Oral BID  . vitamin C  500 mg Oral Daily  . Chlorhexidine Gluconate Cloth  6 each Topical BID  . docusate sodium  100 mg Oral BID  . feeding supplement (PRO-STAT SUGAR FREE 64)  30 mL Oral TID  . insulin aspart  0-15 Units Subcutaneous TID WC  . insulin aspart  3 Units Subcutaneous TID WC  . insulin glargine  10 Units Subcutaneous BID  . iron polysaccharides  150 mg Oral Q breakfast  . levothyroxine  25 mcg Oral Q0600  . methylPREDNISolone acetate  80 mg Intra-articular Once  . oxyCODONE  10 mg Oral Q12H  . pregabalin  100 mg Oral TID  . Ensure Max Protein  11 oz Oral BID  . senna  1 tablet Oral BID  . traZODone  75 mg Oral QHS  . vitamin B-12  1,000 mcg Oral Daily   Continuous Infusions: . ampicillin (OMNIPEN) IV 2 g (04/13/20 1233)  . cefTRIAXone (ROCEPHIN)  IV 2 g (04/13/20 1132)   PRN Meds: acetaminophen, bisacodyl, cyclobenzaprine, ondansetron **OR** ondansetron (ZOFRAN) IV, oxyCODONE, sodium chloride flush, sorbitol   Vital Signs    Vitals:   04/13/20 1047 04/13/20 1055 04/13/20 1105 04/13/20 1126  BP: (!) 77/45 (!) 95/44 (!) 97/49 108/64  Pulse: 81 81 81 88  Resp: 13 17 14 16   Temp: 97.9 F (36.6 C)   97.7 F (36.5 C)  TempSrc: Oral     SpO2: 100% 100% 96% 100%  Weight:      Height:        Intake/Output  Summary (Last 24 hours) at 04/13/2020 1417 Last data filed at 04/13/2020 1300 Gross per 24 hour  Intake 2554 ml  Output --  Net 2554 ml   Last 3 Weights 04/13/2020 04/11/2020 04/10/2020  Weight (lbs) 170 lb 10.2 oz 165 lb 12.6 oz 175 lb 0.7 oz  Weight (kg) 77.4 kg 75.2 kg 79.4 kg      Telemetry    Not currently on - Personally Reviewed  ECG    Post procedure, NSR, LVH, PR interval 196 ms- Personally Reviewed  Physical Exam   GEN: No acute distress.   Neck: No JVD visible Cardiac: RRR, 3/6 SE murmur, no rubs or gallops.  Respiratory: Clear to auscultation bilaterally. GI: Soft, nontender, non-distended  MS: bilateral mild knee swelling/tenderness Neuro:  reports generalized weakness but moves all 4 limbs independently. Psych: Normal affect   Labs    High Sensitivity Troponin:   Recent Labs  Lab 03/20/20 0331  TROPONINIHS 48*      Chemistry Recent Labs  Lab 04/08/20 1131 04/09/20 0411 04/13/20 0418  NA 135 135 139  K 4.0 3.8 3.9  CL 100 100 101  CO2 25 27 26   GLUCOSE 226* 163* 168*  BUN 22 19 21   CREATININE 1.60* 1.40* 1.43*  CALCIUM 9.0 8.7* 8.8*  PROT 6.2* 6.1*  --   ALBUMIN 1.9* 1.9*  --   AST 60* 38  --   ALT 94* 76*  --   ALKPHOS 106 93  --   BILITOT 1.2 0.9  --   GFRNONAA 44* 52* 50*  GFRAA 51* 60* 58*  ANIONGAP 10 8 12      Hematology Recent Labs  Lab 04/07/20 0432 04/08/20 1131 04/13/20 0418  WBC 7.7 10.5 7.9  RBC 2.54* 2.64* 2.55*  HGB 7.6* 7.7* 7.5*  HCT 23.9* 24.6* 24.0*  MCV 94.1 93.2 94.1  MCH 29.9 29.2 29.4  MCHC 31.8 31.3 31.3  RDW 15.5 15.6* 15.9*  PLT 340 330 383    BNPNo results for input(s): BNP, PROBNP in the last 168 hours.   DDimer No results for input(s): DDIMER in the last 168 hours.   Radiology    No results found.  Cardiac Studies   TEE 03/26/20: 1. Left ventricular ejection fraction, by estimation, is 60 to 65%. The  left ventricle has normal function. The left ventricle has no regional  wall motion  abnormalities.  2. Right ventricular systolic function is normal. The right ventricular  size is normal. There is normal pulmonary artery systolic pressure.  3. No left atrial/left atrial appendage thrombus was detected.  4. The mitral valve is myxomatous. Moderate mitral valve regurgitation.  No evidence of mitral stenosis.  5. The aortic valve has been repaired/replaced. Aortic valve  regurgitation is not visualized. No aortic stenosis is present. There is a  23 mm Edwards bioprosthetic valve present in the aortic position.  Procedure Date: 08/16/2016. Echo findings are  consistent with normal structure and function of the aortic valve  prosthesis.  6. The inferior vena cava is normal in size with greater than 50%  respiratory variability, suggesting right atrial pressure of 3 mmHg.    Echo 03/22/20: 1. There is no obvious vegetation, however transaortic gradients across  the bioprosthetic valve are increased from the prior study on 01/16/2020,  mean gradient 28 mmHg, previously 16 mmHg. Consider TEE for further  evaluation.  2. Left ventricular ejection fraction, by estimation, is 55 to 60%. The  left ventricle has normal function. The left ventricle has no regional  wall motion abnormalities. There is mild concentric left ventricular  hypertrophy. Left ventricular diastolic  parameters are consistent with Grade I diastolic dysfunction (impaired  relaxation). Elevated left atrial pressure.  3. Right ventricular systolic function is normal. The right ventricular  size is normal.  4. Left atrial size was mildly dilated.  5. The mitral valve is normal in structure. Mild mitral valve  regurgitation. No evidence of mitral stenosis.  6. The aortic valve has been repaired/replaced. Aortic valve  regurgitation is not visualized. No aortic stenosis is present. There is a  23 mm Edwards-SAPIEN valve present in the aortic position. Aortic valve  mean gradient measures 28.0 mmHg.    7. The inferior vena cava is normal in size with greater than 50%  respiratory variability, suggesting right atrial pressure of 3 mmHg.   Patient Profile     69 y.o. male with a history of CAD s/p CABG in 2017 with most recent cath in 01/2020 showing 2/3 grafts occluded, aortic stenosis s/p AVR at time of CABG, paroxysmal atrial fibrillation on Eliquis, hypertension, dyslipidemia, diabetes mellitus, recent cervical discectomywho is currently in CIR following prolonged hospitalization for sepsis. Cardiology consultedfor the evaluation of atrial flutterwith RVR. Found to have endocarditis and aortic root  abscess.  Assessment & Plan    Atrial flutter with RVR: -on oral amiodarone load, 400 mg BID -s/p TEE-CV today. Now in sinus rhythm. See below re: endocarditis and abscess -on apixaban currently, may need to transition to heparin if he needs surgery.   Endocarditis of aortic valve, with aortic root abscess of prior AVR (2017) - found on TEE 04/13/20 - ID aware and have contacted Dr. Ricard Dillon' team, will be seen tomorrow by CT surgery - on ampicillin and ceftriaxone - complex case with enterococcus faecalis infection, with discitis, bilateral knee septic arthritis, and now endocarditis and aortic root abscess - given abscess, and the fact that the PR interval today is 196 ms (previously 168 ms 03/19/20), recommend he be re-admitted to acute hospital and placed on telemetry for monitoring.  CAD s/p CABG - cath 01/2020 with 2/3 grafts occluded with patent LIMA-LAD - no ASA with eliquis - was on ezetimibe previously  Hypertension - home amlodipine on hold for soft blood pressures  For questions or updates, please contact [place] Please consult [email or web address] for contact info under     Signed, Buford Dresser, MD  04/13/2020, 2:17 PM

## 2020-04-13 NOTE — Transfer of Care (Signed)
Immediate Anesthesia Transfer of Care Note  Patient: Johnathan Murray  Procedure(s) Performed: TRANSESOPHAGEAL ECHOCARDIOGRAM (TEE) (N/A ) CARDIOVERSION (N/A )  Patient Location: Endoscopy Unit  Anesthesia Type:MAC  Level of Consciousness: awake, alert  and oriented  Airway & Oxygen Therapy: Patient Spontanous Breathing  Post-op Assessment: Report given to RN and Post -op Vital signs reviewed and stable  Post vital signs: Reviewed and stable  Last Vitals:  Vitals Value Taken Time  BP 77/45 04/13/20 1047  Temp 36.6 C 04/13/20 1047  Pulse 82 04/13/20 1053  Resp 18 04/13/20 1053  SpO2 100 % 04/13/20 1053  Vitals shown include unvalidated device data.  Last Pain:  Vitals:   04/13/20 1047  TempSrc: Oral  PainSc: 0-No pain      Patients Stated Pain Goal: 2 (74/08/14 4818)  Complications: No apparent anesthesia complications

## 2020-04-13 NOTE — Progress Notes (Signed)
   04/13/20 1400  Clinical Encounter Type  Visited With Patient  Visit Type Initial  Referral From Nurse  Consult/Referral To Chaplain  Spiritual Encounters  Spiritual Needs Prayer;Sacred text  Stress Factors  Patient Stress Factors Health changes   Chaplain responded to request for prayer. Ronalee Belts was in spiritual distress. Chaplain offered ministry of presence, active listening, and prayer. Chaplain will deliver bible to unit. Chaplains remains available for support as needs arise.   Chaplain Resident, Evelene Croon, M Div [phone] on-call pager

## 2020-04-13 NOTE — Plan of Care (Signed)
  Problem: RH SAFETY Goal: RH STG ADHERE TO SAFETY PRECAUTIONS W/ASSISTANCE/DEVICE Description: STG Adhere to Safety Precautions With min Assistance appropriate assistive Device. Outcome: Progressing   Problem: RH PAIN MANAGEMENT Goal: RH STG PAIN MANAGED AT OR BELOW PT'S PAIN GOAL Description: <3 on a 0-10 pain scale. Outcome: Progressing

## 2020-04-14 ENCOUNTER — Inpatient Hospital Stay (HOSPITAL_COMMUNITY): Payer: BC Managed Care – PPO

## 2020-04-14 DIAGNOSIS — R7881 Bacteremia: Secondary | ICD-10-CM | POA: Diagnosis not present

## 2020-04-14 DIAGNOSIS — T826XXA Infection and inflammatory reaction due to cardiac valve prosthesis, initial encounter: Principal | ICD-10-CM

## 2020-04-14 DIAGNOSIS — Z954 Presence of other heart-valve replacement: Secondary | ICD-10-CM | POA: Diagnosis not present

## 2020-04-14 DIAGNOSIS — M4640 Discitis, unspecified, site unspecified: Secondary | ICD-10-CM | POA: Diagnosis not present

## 2020-04-14 DIAGNOSIS — M00262 Other streptococcal arthritis, left knee: Secondary | ICD-10-CM

## 2020-04-14 DIAGNOSIS — I330 Acute and subacute infective endocarditis: Secondary | ICD-10-CM | POA: Diagnosis not present

## 2020-04-14 DIAGNOSIS — B952 Enterococcus as the cause of diseases classified elsewhere: Secondary | ICD-10-CM | POA: Diagnosis not present

## 2020-04-14 DIAGNOSIS — I38 Endocarditis, valve unspecified: Secondary | ICD-10-CM

## 2020-04-14 DIAGNOSIS — I350 Nonrheumatic aortic (valve) stenosis: Secondary | ICD-10-CM | POA: Diagnosis not present

## 2020-04-14 DIAGNOSIS — I480 Paroxysmal atrial fibrillation: Secondary | ICD-10-CM

## 2020-04-14 DIAGNOSIS — M11262 Other chondrocalcinosis, left knee: Secondary | ICD-10-CM

## 2020-04-14 DIAGNOSIS — I2510 Atherosclerotic heart disease of native coronary artery without angina pectoris: Secondary | ICD-10-CM | POA: Diagnosis not present

## 2020-04-14 DIAGNOSIS — I339 Acute and subacute endocarditis, unspecified: Secondary | ICD-10-CM | POA: Diagnosis not present

## 2020-04-14 LAB — HEMOGLOBIN AND HEMATOCRIT, BLOOD
HCT: 21.7 % — ABNORMAL LOW (ref 39.0–52.0)
Hemoglobin: 6.5 g/dL — CL (ref 13.0–17.0)

## 2020-04-14 LAB — BASIC METABOLIC PANEL
Anion gap: 12 (ref 5–15)
BUN: 19 mg/dL (ref 8–23)
CO2: 26 mmol/L (ref 22–32)
Calcium: 8.6 mg/dL — ABNORMAL LOW (ref 8.9–10.3)
Chloride: 99 mmol/L (ref 98–111)
Creatinine, Ser: 1.48 mg/dL — ABNORMAL HIGH (ref 0.61–1.24)
GFR calc Af Amer: 56 mL/min — ABNORMAL LOW (ref >60)
GFR calc non Af Amer: 48 mL/min — ABNORMAL LOW (ref >60)
Glucose, Bld: 182 mg/dL — ABNORMAL HIGH (ref 70–99)
Potassium: 3.9 mmol/L (ref 3.5–5.1)
Sodium: 137 mmol/L (ref 135–145)

## 2020-04-14 LAB — GLUCOSE, CAPILLARY
Glucose-Capillary: 156 mg/dL — ABNORMAL HIGH (ref 70–99)
Glucose-Capillary: 180 mg/dL — ABNORMAL HIGH (ref 70–99)
Glucose-Capillary: 158 mg/dL — ABNORMAL HIGH (ref 70–99)
Glucose-Capillary: 114 mg/dL — ABNORMAL HIGH (ref 70–99)
Glucose-Capillary: 101 mg/dL — ABNORMAL HIGH (ref 70–99)

## 2020-04-14 LAB — CBC
HCT: 21.9 % — ABNORMAL LOW (ref 39.0–52.0)
Hemoglobin: 6.7 g/dL — CL (ref 13.0–17.0)
MCH: 29.1 pg (ref 26.0–34.0)
MCHC: 30.6 g/dL (ref 30.0–36.0)
MCV: 95.2 fL (ref 80.0–100.0)
Platelets: 362 10*3/uL (ref 150–400)
RBC: 2.3 MIL/uL — ABNORMAL LOW (ref 4.22–5.81)
RDW: 16.3 % — ABNORMAL HIGH (ref 11.5–15.5)
WBC: 8.7 10*3/uL (ref 4.0–10.5)
nRBC: 0 % (ref 0.0–0.2)

## 2020-04-14 LAB — HEPARIN LEVEL (UNFRACTIONATED): Heparin Unfractionated: 1.88 IU/mL — ABNORMAL HIGH (ref 0.30–0.70)

## 2020-04-14 LAB — APTT
aPTT: 56 seconds — ABNORMAL HIGH (ref 24–36)
aPTT: 54 seconds — ABNORMAL HIGH (ref 24–36)

## 2020-04-14 LAB — PREPARE RBC (CROSSMATCH)

## 2020-04-14 MED ORDER — NITROGLYCERIN 0.4 MG SL SUBL
SUBLINGUAL_TABLET | SUBLINGUAL | Status: AC
  Administered 2020-04-14: 0.4 mg via SUBLINGUAL
  Filled 2020-04-14: qty 1

## 2020-04-14 MED ORDER — CHLORHEXIDINE GLUCONATE CLOTH 2 % EX PADS
6.0000 | MEDICATED_PAD | Freq: Every day | CUTANEOUS | Status: DC
  Administered 2020-04-14 – 2020-04-30 (×9): 6 via TOPICAL

## 2020-04-14 MED ORDER — METOPROLOL TARTRATE 5 MG/5ML IV SOLN
INTRAVENOUS | Status: AC
  Administered 2020-04-14: 5 mg via INTRAVENOUS
  Filled 2020-04-14: qty 5

## 2020-04-14 MED ORDER — METOPROLOL TARTRATE 5 MG/5ML IV SOLN: 5.0000 mg | Freq: Once | INTRAVENOUS | Status: AC

## 2020-04-14 MED ORDER — OXYCODONE HCL 5 MG PO TABS
10.0000 mg | ORAL_TABLET | ORAL | Status: AC | PRN
  Administered 2020-04-14 – 2020-04-15 (×6): 10 mg via ORAL
  Filled 2020-04-14 (×5): qty 2

## 2020-04-14 MED ORDER — IOHEXOL 350 MG/ML SOLN
100.0000 mL | Freq: Once | INTRAVENOUS | Status: AC | PRN
  Administered 2020-04-14: 100 mL via INTRAVENOUS

## 2020-04-14 MED ORDER — SODIUM CHLORIDE 0.9% IV SOLUTION: Freq: Once | INTRAVENOUS | Status: DC

## 2020-04-14 MED ORDER — NITROGLYCERIN 0.4 MG SL SUBL: 0.4000 mg | SUBLINGUAL_TABLET | Freq: Once | SUBLINGUAL | Status: AC

## 2020-04-14 NOTE — Progress Notes (Signed)
[place] for IV Heparin Indication: atrial fibrillation  Allergies  Allergen Reactions  . Atorvastatin Other (See Comments)    Urinary retention   . Nebivolol Diarrhea    Patient Measurements: Weight: 81.3 kg (179 lb 3.7 oz) Heparin Dosing Weight: 81.3 kg (using updated wt on transfer back to acute side)  Vital Signs: Temp: 98.9 F (37.2 C) (05/25 0431) Temp Source: Oral (05/25 0431) BP: 112/66 (05/25 0431) Pulse Rate: 82 (05/25 0431)  Labs: Recent Labs    04/13/20 0418 04/13/20 1940 04/14/20 0404  HGB 7.5*  --  6.7*  HCT 24.0*  --  21.9*  PLT 383  --  362  APTT  --  43* 56*  HEPARINUNFRC  --  >2.20* 1.88*  CREATININE 1.43*  --  1.48*    Estimated Creatinine Clearance: 50 mL/min (A) (by C-G formula based on SCr of 1.48 mg/dL (H)).   Assessment: 69 yr old male with prosthetic valve endocarditis, aortic root  abscess, disciitis, septic arthritis, enterococcal bacteremia with  atrial fibrillation, on apixaban PTA and continuing on apixaban while inpatient (last dose: apixaban 5 mg PO at 0850 AM today). Pt had TEE-DCCV today (04/13/20), which revealed evolved aortic root abscess with vegetations on leaflet of prosthetic valve. Pharmacy is consulted to transition pt from apixaban to IV heparin, pending evaluation for OR in the near future.  Heparin level remains elevated due to recent apixaban therapy. Will monitor anticoagulation using aPTT until aPTT and heparin levels correlated.  PTT 56 sec (subtherapeutic) on gtt at 1100 units/hr. Noted that Hgb down to 6.7 this morning (baseline 7.7), plt wnl. No overt bleeding noted. No issue with infusion per RN.   Goal of Therapy:  aPTT 66-102 sec(aim for lower end of goal with low Hgb) Heparin level 0.3-0.7 units/ml Monitor platelets by anticoagulation protocol: Yes   Plan:  Increase heparin infusion slightly to 1200 units/hr  F/u PTT in 6 hours F/u Hgb closely F/U plans for  surgery  Sherlon Handing, PharmD, BCPS Please see amion for complete clinical pharmacist phone list 04/14/2020,5:33 AM

## 2020-04-14 NOTE — Consult Note (Signed)
[facility]ious Disease    Date of Admission:  04/13/2020     Total days of antibiotics 25  Ampicillin day 25  Ceftriaxone day 10               Reason for Consult: Enterococcal Prosthetic Valve Endocarditis with Abscess Referring Provider: ID  Primary Care Provider: Nicoletta Dress, MD     Assessment: Johnathan Murray is a 69 y.o. male with disseminated enterococcus faecalis infection involving acute discitis L4-5, bilateral septic arthritis to knees (s/p I&D 03/24/20) and now with repeat TEE an evolved aortic root abscess with vegetation ~5 mm x 8 mm on leaflet of RCC of prosthetic valve in the setting of ongoing intermittent fevers. Dr Johnathan Murray planning for surgical intervention. Cardiac gated CT pending. Hgb down requiring transfusion but no clear sources of bleeding. Cardiology following with slight prolongation to PR interval, otherwise sinus rhythm.  Will continue dual therapy and follow along with surgery timing. Planning to complete blood and draw post infusion labs then will have IVTeam D/C PICC line tonight.    Plan: 1. Continue IV Ampicillin and Ceftriaxone  2. Follow for repeat blood cultures 5/22 (negative thusfar) 3. Remove PICC tonight after 7pm labs, place PIV.       Active Problems:   Bacteremia due to Enterococcus   Aortic valve abscess   Pseudogout of left knee   Septic arthritis (HCC)   Discitis   Prosthetic valve endocarditis (HCC)   Endocarditis   . sodium chloride   Intravenous Once  . amiodarone  400 mg Oral BID  . vitamin C  500 mg Oral Daily  . Chlorhexidine Gluconate Cloth  6 each Topical Daily  . docusate sodium  100 mg Oral BID  . insulin aspart  0-9 Units Subcutaneous TID WC  . insulin aspart  3 Units Subcutaneous TID WC  . insulin glargine  10 Units Subcutaneous BID  . levothyroxine  25 mcg Oral QAC breakfast  . oxyCODONE  10 mg Oral Q12H  . pregabalin  100 mg Oral TID  . senna  1 tablet Oral QHS     HPI: Johnathan Murray is a 69 y.o. male re-admitted to the acute hospital from rehab for newly discovered enterococcus aortic valve vegetation and root abscess. Other disseminated infection sites noted for b/l knees (s/p I&D to both knees) and acute vertebral discitis L4/5 (s/p IR aspiration).   He was transferred back for telemetry monitoring and CT surgery consultation.  He and his wife report that he had a restful night. Johnathan Murray states he does feel a bit tired today. No obvious signs of bleeding.    Review of Systems: ROS  Past Medical History:  Diagnosis Date  . Abnormal LFTs (liver function tests) 10/31/2016  . Abnormal nuclear stress test 05/03/2018  . Acid reflux 02/15/2018   not at present  . Acute otitis externa of left ear 11/13/2019  . Acute suppurative otitis media of left ear without spontaneous rupture of tympanic membrane 11/13/2019  . Anemia    low iron  . Aortic stenosis 05/03/2018  . Arthritis 02/15/2018   Overview:  shoulder  . Cancer ([place])    skin cancer on left wrist  . Cervical myelopathy ([place]) 04/22/2019  . Conductive hearing loss of left ear 11/13/2019  . Coronary artery disease involving native coronary artery of native heart without angina pectoris 05/30/2016  . Coronary artery disease of native artery of native heart  with stable angina pectoris ([place])    pt. denies  . Diabetes mellitus due to underlying condition with unspecified complications ([place]) [id number]  . Dyslipidemia 05/30/2016  . Dyspnea    upon exertion,  as of 06/05/09 not having any recent chest pain  . Dysrhythmia    a fib  . Essential hypertension 05/30/2016  . Heart murmur    Has had an Aortic valve replacement  . Hyperactive gag reflex 02/15/2018  . Hypothyroidism   . Intermittent claudication ([place]) 10/31/2018  . Lumbar stenosis with neurogenic claudication 06/11/2019  . Macular degeneration 02/15/2018  . PAF (paroxysmal atrial fibrillation) ([place]) 05/30/2016  . Pre-operative  cardiovascular examination 01/03/2020  . Preoperative cardiovascular examination 04/17/2019  . Pseudogout of left knee 03/21/2020  . Renal insufficiency 01/17/2020  . S/P aortic valve replacement 01/17/2020  . Tinnitus of left ear 11/13/2019    Social History   Tobacco Use  . Smoking status: Never Smoker  . Smokeless tobacco: Never Used  Substance Use Topics  . Alcohol use: Not Currently  . Drug use: Never    Family History  Problem Relation Age of Onset  . Prostate cancer Brother   . Heart disease Maternal Grandmother   . Brain cancer Mother    Allergies  Allergen Reactions  . Atorvastatin Other (See Comments)    Urinary retention   . Nebivolol Diarrhea    OBJECTIVE: Blood pressure 122/67, pulse 74, temperature 98 F (36.7 C), temperature source Oral, resp. rate 14, weight 81.3 kg, SpO2 100 %.  Physical Exam  Lab Results Lab Results  Component Value Date   WBC 8.7 04/14/2020   HGB 6.5 (LL) 04/14/2020   HCT 21.7 (L) 04/14/2020   MCV 95.2 04/14/2020   PLT 362 04/14/2020    Lab Results  Component Value Date   CREATININE 1.48 (H) 04/14/2020   BUN 19 04/14/2020   NA 137 04/14/2020   K 3.9 04/14/2020   CL 99 04/14/2020   CO2 26 04/14/2020    Lab Results  Component Value Date   ALT 76 (H) 04/09/2020   AST 38 04/09/2020   ALKPHOS 93 04/09/2020   BILITOT 0.9 04/09/2020     Microbiology: Recent Results (from the past 240 hour(s))  Aerobic/Anaerobic Culture (surgical/deep wound)     Status: None   Collection Time: 04/07/20  9:20 AM   Specimen: Abscess  Result Value Ref Range Status   Specimen Description ABSCESS L4/L5 DISC SPACE  Final   Special Requests Normal  Final   Gram Stain   Final    MODERATE WBC PRESENT, PREDOMINANTLY PMN NO ORGANISMS SEEN Performed at [facility], [address] [place], [place] [address]    Culture RARE ENTEROCOCCUS FAECALIS  Final   Report Status 04/13/2020 FINAL  Final   Organism ID, Bacteria ENTEROCOCCUS FAECALIS   Final      Susceptibility   Enterococcus faecalis - MIC*    AMPICILLIN <=2 SENSITIVE Sensitive     VANCOMYCIN 1 SENSITIVE Sensitive     GENTAMICIN SYNERGY SENSITIVE Sensitive     * RARE ENTEROCOCCUS FAECALIS  Culture, fungus without smear     Status: None (Preliminary result)   Collection Time: 04/07/20  9:20 AM   Specimen: Abscess  Result Value Ref Range Status   Specimen Description ABSCESS L4/L5 DISC SPACE  Final   Special Requests Normal  Final   Culture   Final    NO FUNGUS ISOLATED AFTER 7 DAYS Performed at [facility], [address], [place], [place] [address]    Report Status PENDING  Incomplete  Stat Gram stain     Status: None   Collection Time: 04/08/20  4:01 PM   Specimen: Body Fluid  Result Value Ref Range Status   Specimen Description FLUID SYNOVIAL LEFT KNEE  Final   Special Requests NONE  Final   Gram Stain   Final    ABUNDANT WBC PRESENT,BOTH PMN AND MONONUCLEAR NO ORGANISMS SEEN Performed at [facility], [address], [place], [place] [address]    Report Status 04/08/2020 FINAL  Final  Body fluid culture     Status: None   Collection Time: 04/08/20  4:01 PM   Specimen: Body Fluid  Result Value Ref Range Status   Specimen Description FLUID SYNOVIAL LEFT KNEE  Final   Special Requests NONE  Final   Gram Stain   Final    ABUNDANT WBC PRESENT, PREDOMINANTLY PMN NO ORGANISMS SEEN    Culture   Final    NO GROWTH 3 DAYS Performed at [facility], [address] [place], [place] [address]    Report Status 04/11/2020 FINAL  Final  Culture, Urine     Status: None   Collection Time: 04/11/20 10:45 PM   Specimen: Urine, Clean Catch  Result Value Ref Range Status   Specimen Description URINE, CLEAN CATCH  Final   Special Requests NONE  Final   Culture   Final    NO GROWTH Performed at [facility], [place] [address] [place], [place] [address]    Report Status 04/13/2020 FINAL  Final     Janene Madeira, MSN, NP-C [facility] [email or web address] Pager: [phone] Office: [place]: [phone]   04/14/2020 4:37 PM

## 2020-04-14 NOTE — Progress Notes (Signed)
Progress Note  Patient Name: Johnathan Murray Date of Encounter: 04/14/2020  Primary Cardiologist: Jenean Lindau, MD   Subjective   Has complaints of back pain. No chest pain or SOB. Anticipate CV plan later today.   Inpatient Medications    Scheduled Meds: . sodium chloride   Intravenous Once  . amiodarone  400 mg Oral BID  . vitamin C  500 mg Oral Daily  . docusate sodium  100 mg Oral BID  . insulin aspart  0-9 Units Subcutaneous TID WC  . insulin aspart  3 Units Subcutaneous TID WC  . insulin glargine  10 Units Subcutaneous BID  . levothyroxine  25 mcg Oral QAC breakfast  . oxyCODONE  10 mg Oral Q12H  . pregabalin  100 mg Oral TID  . senna  1 tablet Oral QHS   Continuous Infusions: . ampicillin IVPB 2 gram/NS 100 mL (Mini-Bag Plus) 2 g (04/14/20 0401)  . cefTRIAXone (ROCEPHIN) IVPB 2 gram/100 mL NS (Mini-Bag Plus) 2 g (04/14/20 0025)  . heparin 1,200 Units/hr (04/14/20 0545)   PRN Meds: acetaminophen, cyclobenzaprine, metoprolol tartrate, oxyCODONE   Vital Signs    Vitals:   04/13/20 1942 04/14/20 0134 04/14/20 0431  BP: 97/66 108/66 112/66  Pulse: 95 96 82  Resp:   10  Temp: 99 F (37.2 C) 99 F (37.2 C) 98.9 F (37.2 C)  TempSrc: Oral Oral Oral  SpO2: 100% 100% 100%  Weight: 81.3 kg  81.3 kg    Intake/Output Summary (Last 24 hours) at 04/14/2020 0742 Last data filed at 04/14/2020 0351 Gross per 24 hour  Intake 620 ml  Output 650 ml  Net -30 ml   Filed Weights   04/13/20 1942 04/14/20 0431  Weight: 81.3 kg 81.3 kg    Physical Exam   General: Well developed, well nourished, NAD Neck: Negative for carotid bruits. No JVD Lungs:Clear to ausculation bilaterally. Breathing is unlabored. Cardiovascular: RRR with S1 S2. No murmurs Abdomen: Soft, non-tender, non-distended. No obvious abdominal masses. Extremities: No edema. Radial 2+ bilaterally Neuro: Alert and oriented. No focal deficits. No facial asymmetry. MAE spontaneously. Psych:  Responds to questions appropriately with normal affect.    Labs    Chemistry Recent Labs  Lab 04/08/20 1131 04/08/20 1131 04/09/20 0411 04/13/20 0418 04/14/20 0404  NA 135   < > 135 139 137  K 4.0   < > 3.8 3.9 3.9  CL 100   < > 100 101 99  CO2 25   < > 27 26 26   GLUCOSE 226*   < > 163* 168* 182*  BUN 22   < > 19 21 19   CREATININE 1.60*   < > 1.40* 1.43* 1.48*  CALCIUM 9.0   < > 8.7* 8.8* 8.6*  PROT 6.2*  --  6.1*  --   --   ALBUMIN 1.9*  --  1.9*  --   --   AST 60*  --  38  --   --   ALT 94*  --  76*  --   --   ALKPHOS 106  --  93  --   --   BILITOT 1.2  --  0.9  --   --   GFRNONAA 44*   < > 52* 50* 48*  GFRAA 51*   < > 60* 58* 56*  ANIONGAP 10   < > 8 12 12    < > = values in this interval not displayed.     Hematology  Recent Labs  Lab 04/08/20 1131 04/13/20 0418 04/14/20 0404  WBC 10.5 7.9 8.7  RBC 2.64* 2.55* 2.30*  HGB 7.7* 7.5* 6.7*  HCT 24.6* 24.0* 21.9*  MCV 93.2 94.1 95.2  MCH 29.2 29.4 29.1  MCHC 31.3 31.3 30.6  RDW 15.6* 15.9* 16.3*  PLT 330 383 362    Cardiac EnzymesNo results for input(s): TROPONINI in the last 168 hours. No results for input(s): TROPIPOC in the last 168 hours.   BNPNo results for input(s): BNP, PROBNP in the last 168 hours.   DDimer No results for input(s): DDIMER in the last 168 hours.   Radiology    ECHO TEE  Result Date: 04/13/2020    TRANSESOPHOGEAL ECHO REPORT   Patient Name:   Johnathan Murray Date of Exam: 04/13/2020 Medical Rec #:  [id number]           Height:       70.0 in Accession #:    [id number]          Weight:       170.6 lb Date of Birth:  Oct 06, 1951           BSA:          1.951 m Patient Age:    69 years            BP:           105/77 mmHg Patient Gender: M                   HR:           108 bpm. Exam Location:  Inpatient Procedure: Transesophageal Echo, 3D Echo, Color Doppler and Cardiac Doppler Indications:     I48.91* Unspecified atrial fibrillation  History:         Patient has prior history of  Echocardiogram examinations, most                  recent 03/26/2020. CAD, Arrythmias:Atrial Fibrillation; Risk                  Factors:Diabetes, Dyslipidemia and Hypertension. 36mm Edwards                  Sapien Bioprosthetic Aortic Valve implanted 08/16/16.  Sonographer:     Raquel Sarna Senior RDCS Referring Phys:  [id number] Tami Lin [facility] Diagnosing Phys: Eleonore Chiquito MD PROCEDURE: After discussion of the risks and benefits of a TEE, an informed consent was obtained from the patient. TEE procedure time was 19 minutes. The transesophogeal probe was passed without difficulty through the esophogus of the patient. Local oropharyngeal anesthetic was provided with viscous lidocaine. Sedation performed by different physician. The patient was monitored while under deep sedation. Anesthestetic sedation was provided intravenously by Anesthesiology: 260mg  of Propofol, 50mg  of Lidocaine. Image quality was excellent. The patient's vital signs; including heart rate, blood pressure, and oxygen saturation; remained stable throughout the procedure. The patient developed no complications during the procedure. A successful direct current cardioversion was performed at 200 joules with 1 attempt. IMPRESSIONS  1. There is a small vegetation attached to the leaflet of the [place] that measures 0.8 cm x 0.5 cm. There is a pseudoaneurysm located in the anterior aspect of the aortic root, which is actually best seen on the supplemental TTE images. There is no fistulous connection with the LVOT and no apparent regurgitation. The color flow appears to be in and out of the pseudoaneurysm. Overall, these findings are consistent with prosthetic valve endocarditis  with aortic root abscess. A cardiac CT is recommended to better characterize the location and extent of the pseudoaneurysm. The aortic valve has been repaired/replaced. Aortic valve regurgitation is not visualized. Aortic valve mean gradient measures 13.0 mmHg. Aortic valve Vmax measures 2.32  m/s.  2. Left ventricular ejection fraction, by estimation, is 50 to 55%. The left ventricle has low normal function. The left ventricle has no regional wall motion abnormalities.  3. Right ventricular systolic function is normal. The right ventricular size is normal.  4. No left atrial/left atrial appendage thrombus was detected. The LAA emptying velocity was 56 cm/s.  5. The mitral valve is grossly normal. Mild mitral valve regurgitation. No evidence of mitral stenosis.  6. There is a pseudoaneurysm of the aortic root anterior to the RCC with clear color flow into the affected area. Aortic root abscess present. There is mild (Grade II) layered plaque involving the descending aorta. FINDINGS  Left Ventricle: Left ventricular ejection fraction, by estimation, is 50 to 55%. The left ventricle has low normal function. The left ventricle has no regional wall motion abnormalities. The left ventricular internal cavity size was normal in size. There is no left ventricular hypertrophy. Abnormal (paradoxical) septal motion consistent with post-operative status. Right Ventricle: The right ventricular size is normal. No increase in right ventricular wall thickness. Right ventricular systolic function is normal. Left Atrium: Left atrial size was normal in size. No left atrial/left atrial appendage thrombus was detected. The LAA emptying velocity was 56 cm/s. Right Atrium: Right atrial size was normal in size. Pericardium: Trivial pericardial effusion is present. Mitral Valve: The mitral valve is grossly normal. There is mild thickening of the mitral valve leaflet(s). Mild mitral valve regurgitation. No evidence of mitral valve stenosis. There is no evidence of mitral valve vegetation. Tricuspid Valve: The tricuspid valve is grossly normal. Tricuspid valve regurgitation is trivial. No evidence of tricuspid stenosis. There is no evidence of tricuspid valve vegetation. Aortic Valve: There is a small vegetation attached to the  leaflet of the [place] that measures 0.8 cm x 0.5 cm. There is a pseudoaneurysm located in the anterior aspect of the aortic root, which is actually best seen on the supplemental TTE images. There is no fistulous connection with the LVOT and no apparent regurgitation. The color flow appears to be in and out of the pseudoaneurysm. 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 A cardiac CT is recommended to better characterize the location and extent of the pseudoaneurysm. The aortic valve has been repaired/replaced. Aortic valve regurgitation is not visualized. Aortic valve mean gradient measures 13.0 mmHg. Aortic valve peak gradient measures 21.5 mmHg. Aortic valve area, by VTI measures 1.79 cm. There is a 23 mm bioprosthetic valve present in the aortic position. Pulmonic Valve: The pulmonic valve was grossly normal. Pulmonic valve regurgitation is not visualized. No evidence of pulmonic stenosis. Aorta: There is a pseudoaneurysm of the aortic root anterior to the RCC with clear color flow into the affected area. Root abscess present. There is mild (Grade II) layered plaque involving the descending aorta. IAS/Shunts: No atrial level shunt detected by color flow Doppler.  LEFT VENTRICLE PLAX 2D LVOT diam:     2.10 cm LV SV:         62 LV SV Index:   32 LVOT Area:     3.46 cm  AORTIC VALVE AV Area (VTI):     1.79 cm AV Vmax:  232.08 cm/s AV Vmean:          172.165 cm/s AV VTI:            0.349 m AV Peak Grad:      21.5 mmHg AV Mean Grad:      13.0 mmHg LVOT VTI:          0.180 m LVOT/AV VTI ratio: 0.52  AORTA Ao Asc diam: 3.20 cm TRICUSPID VALVE TR Peak grad:   17.5 mmHg TR Vmax:        209.00 cm/s  SHUNTS Systemic VTI:  0.18 m Systemic Diam: 2.10 cm Eleonore Chiquito MD Electronically signed by Eleonore Chiquito MD Signature Date/Time: 04/13/2020/2:40:09 PM    Final    Telemetry    04/13/20 NSR HR 70-80's  - Personally Reviewed  ECG    No new tracing as of  04/13/20 - Personally Reviewed  Cardiac Studies   TEE 03/26/20: 1. Left ventricular ejection fraction, by estimation, is 60 to 65%. The  left ventricle has normal function. The left ventricle has no regional  wall motion abnormalities.  2. Right ventricular systolic function is normal. The right ventricular  size is normal. There is normal pulmonary artery systolic pressure.  3. No left atrial/left atrial appendage thrombus was detected.  4. The mitral valve is myxomatous. Moderate mitral valve regurgitation.  No evidence of mitral stenosis.  5. The aortic valve has been repaired/replaced. Aortic valve  regurgitation is not visualized. No aortic stenosis is present. There is a  23 mm Edwards bioprosthetic valve present in the aortic position.  Procedure Date: 08/16/2016. Echo findings are  consistent with normal structure and function of the aortic valve  prosthesis.  6. The inferior vena cava is normal in size with greater than 50%  respiratory variability, suggesting right atrial pressure of 3 mmHg.    Echo 03/22/20: 1. There is no obvious vegetation, however transaortic gradients across  the bioprosthetic valve are increased from the prior study on 01/16/2020,  mean gradient 28 mmHg, previously 16 mmHg. Consider TEE for further  evaluation.  2. Left ventricular ejection fraction, by estimation, is 55 to 60%. The  left ventricle has normal function. The left ventricle has no regional  wall motion abnormalities. There is mild concentric left ventricular  hypertrophy. Left ventricular diastolic  parameters are consistent with Grade I diastolic dysfunction (impaired  relaxation). Elevated left atrial pressure.  3. Right ventricular systolic function is normal. The right ventricular  size is normal.  4. Left atrial size was mildly dilated.  5. The mitral valve is normal in structure. Mild mitral valve  regurgitation. No evidence of mitral stenosis.  6. The aortic valve has  been repaired/replaced. Aortic valve  regurgitation is not visualized. No aortic stenosis is present. There is a  23 mm Edwards-SAPIEN valve present in the aortic position. Aortic valve  mean gradient measures 28.0 mmHg.  7. The inferior vena cava is normal in size with greater than 50%  respiratory variability, suggesting right atrial pressure of 3 mmHg.   Patient Profile     69 y.o. male with a history of CAD s/p CABG in 2017 with most recent cath in 01/2020 showing 2/3 grafts occluded, aortic stenosis s/p AVR at time of CABG, paroxysmal atrial fibrillation on Eliquis, hypertension, dyslipidemia, diabetes mellitus, recent cervical discectomywho is currently in CIR following prolonged hospitalization for sepsis. Cardiology consultedfor the evaluation of atrial flutterwith RVR. Found to have endocarditis and aortic root abscess.  Assessment & Plan  1. Atrial flutter with RVR: -On p.o. amiodarone 400 mg twice daily  -s/p TEE-CV 04/13/2020 >>> NSR  -Evidence of endocarditis and abscess>> see plan below -Eliquis stopped 04/13/2020>> ideally should be off 4 to 5 days prior to aortic valve replacement redo per Dr. Cyndia Bent  -Transitioned to heparin>> per pharmacy  2. Endocarditis of aortic valve, with aortic root abscess of prior AVR (2017) -TTE 04/13/2020 found on TEE 04/13/20 -Followed by ID for antibiotic therapy>> ampicillin/ceftriaxone -Seen by T CTS -Plan for aortic valve replacement will be due with probable aortic root replacement per Dr. Orland Mustard -?  May need cardiac CTA to better assess perivalvular area prior to surgery>> decision to be made today  3. CAD s/p CABG -Last LHC 01/2020 with 2/3 grafts occluded with patent LIMA to LAD -No ASA however Eliquis stopped 04/13/2020 in anticipation of cardiac surgery -Denies anginal symptoms  4.  Hypertension -Soft BPs, 112/66, 108/66, 97/66 -PTA antihypertensives currently on hold   Signed, Kathyrn Drown NP-C [facility] Pager:  [phone] 04/14/2020, 7:42 AM     For questions or updates, please contact   Please consult [email or web address] for contact info under Cardiology/STEMI.

## 2020-04-14 NOTE — Progress Notes (Signed)
Procedure(s) (LRB): REDO STERNOTOMY (N/A) ASCENDING AORTIC ROOT REPLACEMENT - HOMOGRAFT (N/A) TRANSESOPHAGEAL ECHOCARDIOGRAM (TEE) (N/A) Subjective: Only complaint is of same back pain. Tmax 100.9 last pm, afebrile since  Objective: Vital signs in last 24 hours: Temp:  [97.8 F (36.6 C)-99 F (37.2 C)] 98.1 F (36.7 C) (05/25 1716) Pulse Rate:  [68-96] 88 (05/25 1716) Cardiac Rhythm: Bundle branch block;Heart block (05/25 0700) Resp:  [10-15] 14 (05/25 1315) BP: (97-148)/(58-74) 148/74 (05/25 1716) SpO2:  [100 %] 100 % (05/25 1716) Weight:  [81.3 kg] 81.3 kg (05/25 0431)  Hemodynamic parameters for last 24 hours:    Intake/Output from previous day: 05/24 0701 - 05/25 0700 In: 620 [P.O.:620] Out: 650 [Urine:650] Intake/Output this shift: Total I/O In: 1036 [P.O.:240; I.V.:185.9; Blood:315; IV Piggyback:295.1] Out: 900 [Urine:900]  General appearance: alert and cooperative Heart: regular rate and rhythm, S1, S2 normal, 2/6 systolic murmur, no diastolic murmur Lungs: clear to auscultation bilaterally Extremities: no edema  Lab Results: Recent Labs    04/13/20 0418 04/13/20 0418 04/14/20 0404 04/14/20 1331  WBC 7.9  --  8.7  --   HGB 7.5*   < > 6.7* 6.5*  HCT 24.0*   < > 21.9* 21.7*  PLT 383  --  362  --    < > = values in this interval not displayed.   BMET:  Recent Labs    04/13/20 0418 04/14/20 0404  NA 139 137  K 3.9 3.9  CL 101 99  CO2 26 26  GLUCOSE 168* 182*  BUN 21 19  CREATININE 1.43* 1.48*  CALCIUM 8.8* 8.6*    PT/INR: No results for input(s): LABPROT, INR in the last 72 hours. 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 I reviewed the gated cardiac CTA done today and there is a pseudoaneurysm at the base  of the aortic valve prosthesis projecting toward the RV outflow tract. He will require aortic root replacement with a homograft. I discussed the operative procedure with the patient and his wife including alternatives, benefits and risks; including but not limited to bleeding, blood transfusion, infection, stroke, myocardial infarction, graft failure, heart block requiring a permanent pacemaker, organ dysfunction, and death.  [name] understands and agrees to proceed.  We will schedule surgery for Thursday this week.   LOS: 1 day    Gaye Pollack 04/14/2020

## 2020-04-14 NOTE — Progress Notes (Signed)
This nurse placed another PIV for non-compatible ATB. This patient has very limited vasculature and nurse made aware. PICC line removed per order. Instructed patient on procedure, HOB less than 45*, pt held breath upon line removal. Pressure held for approx. 25min d/t actively infusing Heparin. No bleeding noted, pressure drsg applied and instructed patient to leave in place for 24 hrs. Keeping it CDI. And to apply pressure to site and notify RN if any bleeding noted. Pt/SO VU. Mary RN made aware of above infor. VU. Fran Lowes, RN VAST

## 2020-04-14 NOTE — Progress Notes (Signed)
PROGRESS NOTE  Johnathan Murray [id number] DOB: 1950-11-27 DOA: 04/13/2020 PCP: Nicoletta Dress, MD  Brief History    Johnathan Murray is a 69 y.o. male with medical history significant of medical history including previous aortic valve replacement biosynthetic in 2017, IDDM, HTN, paroxysmal A. fib on Eliquis, CAD. He presented on 03/19/2020 with low-grade fevers and weakness in the lower extremities. He underwent extensive diagnostic evaluation and was placed on broad-spectrum antibiotics. He was found on MRI of the lumbar spine to have acute osteomyelitis discitis at L4-5 with soft tissue infection cellulitis within the psoas musculature bilaterally and a few small superimposed abscesses. Neurosurgery was consulted and he underwent aspiration of the disc space. Initial TEE showed no obvious vegetation. Then patient developed bilateral knee pain and swelling and x-ray showed diffuse degenerative changes as well as large joint effusion, and joint aspiration on 03/21/2020 and right lower extremity joint aspiration on 03/22/2020 both showing intracellular calcium pyrophosphate crystals with white blood cell count up to 15,000 and culture grew rare Enterococcus faecalis. Patient was transferred to inpatient rehab. Over the weekend, he did develop atrial fibrillation with RVR and TEE-cardioversion was arranged this morning, during TEE it was found there was a newly developed aortic root abscess with vegetations approximately 5 mm x 8 mm on the leaflet of RCC of prosthetic valve.   Review of Systems: As per HPI otherwise 10 point review of systems negative.  The patient has been admitted to a telemetry bed. Infectious disease has been consulted and has asked that PICC be removed and blood cultures collected again. She wishes for the patient to have a line holiday. She want to continue the patient on dual therapy of ampicillin and ceftriaxone which was started on 04/09/2020. They also wish to eliminate  any other possible sources of seeding of infection. Cardiothoracic surgery has been consulted. Dr. Cyndia Bent feels that the patient is likely to require a redo of the aortic valve replacement and aortic root replacement using a homograft.   The patient has been converted from eliquis to heparin gtt. Hemoglobin this morning was 6.7. The patient is receiving 1 unit of blood in transfusion. I have discussed the patient with pharmacy and cardiology. If the patient's hemoglobin continues to drop, heparin will be stopped.  Consultants  . Infectious disease . Cardiology . Cardiothoracic Surgery . neuropsychiatry  Procedures  . None  Antibiotics   Anti-infectives (From admission, onward)   Start     Dose/Rate Route Frequency Ordered Stop   04/13/20 2330  cefTRIAXone (ROCEPHIN) 2 g in sodium chloride 0.9 % 100 mL IVPB     2 g 200 mL/hr over 30 Minutes Intravenous Every 12 hours 04/13/20 2005     04/13/20 2300  ampicillin (OMNIPEN) 2 g in sodium chloride 0.9 % 100 mL IVPB     2 g 300 mL/hr over 20 Minutes Intravenous Every 6 hours 04/13/20 2005      .  Subjective  The patient is resting quietly. No new complaints.  Objective   Vitals:  Vitals:   04/14/20 1315 04/14/20 1350  BP: (!) 102/58 122/67  Pulse: 68 74  Resp: 14   Temp: 97.9 F (36.6 C) 98 F (36.7 C)  SpO2: 100% 100%    Exam:  Constitutional:  . The patient is awake, alert, and oriented x 3. No acute distress. Respiratory:  . No increased work of breathing. . No wheezes, rales, or rhonchi . No tactile fremitus Cardiovascular:  . Regular rate and rhythm .  No ectopy, or gallups . Positive for murmur . No lateral PMI. No thrills. Abdomen:  . Abdomen is soft, non-tender, non-distended . No hernias, masses, or organomegaly . Normoactive bowel sounds.  Musculoskeletal:  . No cyanosis, clubbing, or edema Skin:  . No rashes, lesions, ulcers . palpation of skin: no induration or nodules Neurologic:  . CN 2-12  intact . Sensation all 4 extremities intact Psychiatric:  . Mental status o Mood, affect appropriate o Orientation to person, place, time  . judgment and insight appear intact I have personally reviewed the following:   Today's Data  . Hemoglobin and hematocrit, PTT, BMP, CBC, Glucoses.  Micro Data  . Blood cultures + for Enterococcus faecalis  Imaging  . CT Coronary Morph with CTA Cor with Score, CA, CM & OR, without CM.  Cardiology Data  . TEE 04/13/2020  Scheduled Meds: . sodium chloride   Intravenous Once  . amiodarone  400 mg Oral BID  . vitamin C  500 mg Oral Daily  . Chlorhexidine Gluconate Cloth  6 each Topical Daily  . docusate sodium  100 mg Oral BID  . insulin aspart  0-9 Units Subcutaneous TID WC  . insulin aspart  3 Units Subcutaneous TID WC  . insulin glargine  10 Units Subcutaneous BID  . levothyroxine  25 mcg Oral QAC breakfast  . oxyCODONE  10 mg Oral Q12H  . pregabalin  100 mg Oral TID  . senna  1 tablet Oral QHS   Continuous Infusions: . ampicillin IVPB 2 gram/NS 100 mL (Mini-Bag Plus) 2 g (04/14/20 1010)  . cefTRIAXone (ROCEPHIN) IVPB 2 gram/100 mL NS (Mini-Bag Plus) 2 g (04/14/20 1122)  . heparin 1,200 Units/hr (04/14/20 0545)    Active Problems:   Bacteremia due to Enterococcus   Pseudogout of left knee   Septic arthritis (HCC)   Discitis   Prosthetic valve endocarditis ([place])   Aortic valve abscess   Endocarditis   LOS: 1 day   A & P  Acute bacterial endocarditis with vegetation and aortic root abscess: the patient has been admitted to a telemetry bed. Eliquis has been stopped and heparin drip started. Antibiotics will be continued as per infectious disease (ampicillin and ceftriaxone). PICC will be pulled, blood cultures will be repeated and the patient will have a line holiday. Dr. Cyndia Bent feels that the patient is likely to require a redo of the aortic valve replacement and aortic root replacement using a homograft. This will need to wait  until 5 days have passed to allow for wash out of Eliquis.   L4-L5 discitis with bacteremia: Antibiotics as per ID. Amipicillin and Ceftriaxone.  Anemia: The patient will be transfused with 1 unit of PRBC's. Continue to monitor H&H and consider discontinuation of heparin if patient has continued bleeding.  IDDM: Lantus plus sliding scale. Glucoses have been from 131-160.  Hypertension: Borderline hypotensive today, hold BP meds.  Chronic A. Fib: Continue rate control medications, switch Eliquis to heparin drip.  Hypothyroidism: Continue Synthroid as at home.  I have seen and examined this patient myself. I have spent 35 minutes in his evaluation and care.  DVT prophylaxis: Heparin drip Code Status: Full code Family Communication: Wife at bedside Disposition Plan: The patient is from inpatient rehab unit. I anticipated discharge back to inpatient rehab vs SNF. Barriers to discharge include acute bacterial endocarditis with vegetation and aortic root abscess requiring surgical intervention and control of infection. Johnathan Gilkison, DO [facility] Direct contact: see [email or web address]  7PM-7AM contact  night coverage as above 04/14/2020, 5:16 PM  LOS: 1 day

## 2020-04-14 NOTE — Progress Notes (Signed)
CRITICAL VALUE ALERT  Critical Value:  Hgb 6.7  Date & Time Notied: 04/14/20 0505  Provider Notified: Sharlet Salina  Orders Received/Actions taken: Will await orders

## 2020-04-14 NOTE — Progress Notes (Signed)
CRITICAL VALUE ALERT  Critical Value:  Hgb 6.5  Date & Time Notied:  5/25 1410  Provider Notified: Benny Lennert, MD   Orders Received/Actions taken: RBC currently transfusing. Will continue to monitor

## 2020-04-14 NOTE — Progress Notes (Signed)
Inpatient Rehabilitation Care Coordinator  Discharge Note  The overall goal for the admission was met for:   Discharge location: Yes. D/c to acute hospital  Length of Stay: Yes. 6 days  Discharge activity level: Yes  Home/community participation: Yes  Services provided included: MD, RD, PT, OT, RN, CM, TR, Pharmacy, Neuropsych and SW  Financial Services: Medicare  Follow-up services arranged: N/A  Comments (or additional information):  Patient/Family verbalized understanding of follow-up arrangements: Yes  Individual responsible for coordination of the follow-up plan: Pt to have assistance with coordinating care needs.   Confirmed correct DME delivered: Rana Snare 04/14/2020    Rana Snare

## 2020-04-14 NOTE — Progress Notes (Signed)
   04/14/20 1100  Clinical Encounter Type  Visited With Patient and family together  Visit Type Follow-up  Referral From Chaplain  Consult/Referral To Chaplain  Spiritual Encounters  Spiritual Needs Sacred text;Prayer  Stress Factors  Patient Stress Factors Health changes   Chaplain followed up to be sure bible was delivered. Chaplain will add Ronalee Belts to unit chaplain's rounding list for daily follow up. Chaplains remains available for support as needs arise.   Chaplain Resident, Evelene Croon, M Div [phone] on-call pager

## 2020-04-14 NOTE — Progress Notes (Signed)
[place] for IV Heparin Indication: atrial fibrillation  Allergies  Allergen Reactions  . Atorvastatin Other (See Comments)    Urinary retention   . Nebivolol Diarrhea    Patient Measurements: Weight: 81.3 kg (179 lb 3.7 oz) Heparin Dosing Weight: 81.3 kg (using updated wt on transfer back to acute side)  Vital Signs: Temp: 98 F (36.7 C) (05/25 1350) Temp Source: Oral (05/25 1350) BP: 122/67 (05/25 1350) Pulse Rate: 74 (05/25 1350)  Labs: Recent Labs    04/13/20 0418 04/13/20 0418 04/13/20 1940 04/14/20 0404 04/14/20 1331  HGB 7.5*   < >  --  6.7* 6.5*  HCT 24.0*  --   --  21.9* 21.7*  PLT 383  --   --  362  --   APTT  --   --  43* 56* 54*  HEPARINUNFRC  --   --  >2.20* 1.88*  --   CREATININE 1.43*  --   --  1.48*  --    < > = values in this interval not displayed.    Estimated Creatinine Clearance: 50 mL/min (A) (by C-G formula based on SCr of 1.48 mg/dL (H)).   Medical History: Past Medical History:  Diagnosis Date  . Abnormal LFTs (liver function tests) 10/31/2016  . Abnormal nuclear stress test 05/03/2018  . Acid reflux 02/15/2018   not at present  . Acute otitis externa of left ear 11/13/2019  . Acute suppurative otitis media of left ear without spontaneous rupture of tympanic membrane 11/13/2019  . Anemia    low iron  . Aortic stenosis 05/03/2018  . Arthritis 02/15/2018   Overview:  shoulder  . Cancer ([place])    skin cancer on left wrist  . Cervical myelopathy ([place]) 04/22/2019  . Conductive hearing loss of left ear 11/13/2019  . Coronary artery disease involving native coronary artery of native heart without angina pectoris 05/30/2016  . Coronary artery disease of native artery of native heart with stable angina pectoris ([place])    pt. denies  . Diabetes mellitus due to underlying condition with unspecified complications ([place]) [id number]  . Dyslipidemia 05/30/2016  . Dyspnea    upon exertion,  as of 06/05/09 not having  any recent chest pain  . Dysrhythmia    a fib  . Essential hypertension 05/30/2016  . Heart murmur    Has had an Aortic valve replacement  . Hyperactive gag reflex 02/15/2018  . Hypothyroidism   . Intermittent claudication ([place]) 10/31/2018  . Lumbar stenosis with neurogenic claudication 06/11/2019  . Macular degeneration 02/15/2018  . PAF (paroxysmal atrial fibrillation) ([place]) 05/30/2016  . Pre-operative cardiovascular examination 01/03/2020  . Preoperative cardiovascular examination 04/17/2019  . Pseudogout of left knee 03/21/2020  . Renal insufficiency 01/17/2020  . S/P aortic valve replacement 01/17/2020  . 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 Pt had TEE-DCCV today (04/13/20), which revealed evolved aortic root abscess with vegetations on leaflet of prosthetic valve. Pharmacy dose IV heparin pending evaluation for OR in the near future. -aPTT= 54 and below goal -Hg 6.5 (s/p PRBC)  Spoke with MD: no change in heparin dosing now. Md to discuss plans with cardiology  Goal of Therapy:  aPTT 66-85 sec Heparin level = 0.3-0.5 Monitor platelets by  anticoagulation protocol: Yes   Plan:  No heparin changes now Monitor daily aPTT, heparin level, CBC  Hildred Laser, PharmD Clinical Pharmacist **Pharmacist phone directory can now be found on [email or web address] ([email or web address]).  Listed under [place].

## 2020-04-15 ENCOUNTER — Inpatient Hospital Stay (HOSPITAL_COMMUNITY): Payer: BC Managed Care – PPO

## 2020-04-15 DIAGNOSIS — M00261 Other streptococcal arthritis, right knee: Secondary | ICD-10-CM

## 2020-04-15 DIAGNOSIS — I330 Acute and subacute infective endocarditis: Secondary | ICD-10-CM

## 2020-04-15 DIAGNOSIS — Z794 Long term (current) use of insulin: Secondary | ICD-10-CM

## 2020-04-15 DIAGNOSIS — I4892 Unspecified atrial flutter: Secondary | ICD-10-CM

## 2020-04-15 DIAGNOSIS — I10 Essential (primary) hypertension: Secondary | ICD-10-CM

## 2020-04-15 DIAGNOSIS — I350 Nonrheumatic aortic (valve) stenosis: Secondary | ICD-10-CM | POA: Diagnosis not present

## 2020-04-15 DIAGNOSIS — E1165 Type 2 diabetes mellitus with hyperglycemia: Secondary | ICD-10-CM

## 2020-04-15 DIAGNOSIS — Z951 Presence of aortocoronary bypass graft: Secondary | ICD-10-CM

## 2020-04-15 DIAGNOSIS — Z954 Presence of other heart-valve replacement: Secondary | ICD-10-CM | POA: Diagnosis not present

## 2020-04-15 DIAGNOSIS — I339 Acute and subacute endocarditis, unspecified: Secondary | ICD-10-CM | POA: Diagnosis not present

## 2020-04-15 DIAGNOSIS — T826XXA Infection and inflammatory reaction due to cardiac valve prosthesis, initial encounter: Secondary | ICD-10-CM | POA: Diagnosis not present

## 2020-04-15 DIAGNOSIS — M4640 Discitis, unspecified, site unspecified: Secondary | ICD-10-CM | POA: Diagnosis not present

## 2020-04-15 DIAGNOSIS — Z01810 Encounter for preprocedural cardiovascular examination: Secondary | ICD-10-CM

## 2020-04-15 DIAGNOSIS — B952 Enterococcus as the cause of diseases classified elsewhere: Secondary | ICD-10-CM

## 2020-04-15 DIAGNOSIS — R7881 Bacteremia: Secondary | ICD-10-CM | POA: Diagnosis not present

## 2020-04-15 LAB — COMPREHENSIVE METABOLIC PANEL
ALT: 68 U/L — ABNORMAL HIGH (ref 0–44)
AST: 37 U/L (ref 15–41)
Albumin: 2 g/dL — ABNORMAL LOW (ref 3.5–5.0)
Alkaline Phosphatase: 92 U/L (ref 38–126)
Anion gap: 12 (ref 5–15)
BUN: 17 mg/dL (ref 8–23)
CO2: 25 mmol/L (ref 22–32)
Calcium: 8.6 mg/dL — ABNORMAL LOW (ref 8.9–10.3)
Chloride: 99 mmol/L (ref 98–111)
Creatinine, Ser: 1.45 mg/dL — ABNORMAL HIGH (ref 0.61–1.24)
GFR calc Af Amer: 57 mL/min — ABNORMAL LOW (ref >60)
GFR calc non Af Amer: 49 mL/min — ABNORMAL LOW (ref >60)
Glucose, Bld: 121 mg/dL — ABNORMAL HIGH (ref 70–99)
Potassium: 4.2 mmol/L (ref 3.5–5.1)
Sodium: 136 mmol/L (ref 135–145)
Total Bilirubin: 0.6 mg/dL (ref 0.3–1.2)
Total Protein: 6.1 g/dL — ABNORMAL LOW (ref 6.5–8.1)

## 2020-04-15 LAB — CBC WITH DIFFERENTIAL/PLATELET
Abs Immature Granulocytes: 0.17 10*3/uL — ABNORMAL HIGH (ref 0.00–0.07)
Basophils Absolute: 0.1 10*3/uL (ref 0.0–0.1)
Basophils Relative: 1 %
Eosinophils Absolute: 0.3 10*3/uL (ref 0.0–0.5)
Eosinophils Relative: 3 %
HCT: 29.6 % — ABNORMAL LOW (ref 39.0–52.0)
Hemoglobin: 9.1 g/dL — ABNORMAL LOW (ref 13.0–17.0)
Immature Granulocytes: 2 %
Lymphocytes Relative: 12 %
Lymphs Abs: 1.2 10*3/uL (ref 0.7–4.0)
MCH: 28.4 pg (ref 26.0–34.0)
MCHC: 30.7 g/dL (ref 30.0–36.0)
MCV: 92.5 fL (ref 80.0–100.0)
Monocytes Absolute: 0.5 10*3/uL (ref 0.1–1.0)
Monocytes Relative: 4 %
Neutro Abs: 8.2 10*3/uL — ABNORMAL HIGH (ref 1.7–7.7)
Neutrophils Relative %: 78 %
Platelets: 372 10*3/uL (ref 150–400)
RBC: 3.2 MIL/uL — ABNORMAL LOW (ref 4.22–5.81)
RDW: 16.5 % — ABNORMAL HIGH (ref 11.5–15.5)
WBC: 10.4 10*3/uL (ref 4.0–10.5)
nRBC: 0 % (ref 0.0–0.2)

## 2020-04-15 LAB — TYPE AND SCREEN
ABO/RH(D): A POS
Antibody Screen: NEGATIVE
Unit division: 0
Unit division: 0
Unit division: 0
Unit division: 0
Unit division: 0

## 2020-04-15 LAB — GLUCOSE, CAPILLARY
Glucose-Capillary: 158 mg/dL — ABNORMAL HIGH (ref 70–99)
Glucose-Capillary: 119 mg/dL — ABNORMAL HIGH (ref 70–99)
Glucose-Capillary: 140 mg/dL — ABNORMAL HIGH (ref 70–99)
Glucose-Capillary: 93 mg/dL (ref 70–99)

## 2020-04-15 LAB — BPAM RBC
Blood Product Expiration Date: 202106022359
Blood Product Expiration Date: 202106042359
Blood Product Expiration Date: 202106042359
Blood Product Expiration Date: 202106062359
Blood Product Expiration Date: 202106142359
ISSUE DATE / TIME: 202105251322
Unit Type and Rh: 6200
Unit Type and Rh: 6200
Unit Type and Rh: 6200
Unit Type and Rh: 6200
Unit Type and Rh: 6200

## 2020-04-15 LAB — APTT
aPTT: 39 seconds — ABNORMAL HIGH (ref 24–36)
aPTT: 56 seconds — ABNORMAL HIGH (ref 24–36)

## 2020-04-15 LAB — HEPARIN LEVEL (UNFRACTIONATED): Heparin Unfractionated: 0.56 IU/mL (ref 0.30–0.70)

## 2020-04-15 LAB — PREPARE RBC (CROSSMATCH)

## 2020-04-15 LAB — HEMOGLOBIN AND HEMATOCRIT, BLOOD
HCT: 26.3 % — ABNORMAL LOW (ref 39.0–52.0)
Hemoglobin: 8.3 g/dL — ABNORMAL LOW (ref 13.0–17.0)

## 2020-04-15 MED ORDER — DEXMEDETOMIDINE HCL IN NACL 400 MCG/100ML IV SOLN
0.1000 ug/kg/h | INTRAVENOUS | Status: AC
  Administered 2020-04-16: .3 ug/kg/h via INTRAVENOUS
  Filled 2020-04-15: qty 100

## 2020-04-15 MED ORDER — TRANEXAMIC ACID (OHS) PUMP PRIME SOLUTION
2.0000 mg/kg | INTRAVENOUS | Status: DC
  Filled 2020-04-15: qty 1.63

## 2020-04-15 MED ORDER — CHLORHEXIDINE GLUCONATE CLOTH 2 % EX PADS
6.0000 | MEDICATED_PAD | Freq: Once | CUTANEOUS | Status: AC
  Administered 2020-04-15: 6 via TOPICAL

## 2020-04-15 MED ORDER — MILRINONE LACTATE IN DEXTROSE 20-5 MG/100ML-% IV SOLN
0.3000 ug/kg/min | INTRAVENOUS | Status: AC
  Administered 2020-04-16: .2 ug/kg/min via INTRAVENOUS
  Filled 2020-04-15: qty 100

## 2020-04-15 MED ORDER — DIAZEPAM 5 MG PO TABS
5.0000 mg | ORAL_TABLET | Freq: Once | ORAL | Status: AC
  Administered 2020-04-16: 5 mg via ORAL
  Filled 2020-04-15: qty 1

## 2020-04-15 MED ORDER — TRANEXAMIC ACID 1000 MG/10ML IV SOLN
1.5000 mg/kg/h | INTRAVENOUS | Status: AC
  Administered 2020-04-16: 1.5 mg/kg/h via INTRAVENOUS
  Filled 2020-04-15: qty 25

## 2020-04-15 MED ORDER — VANCOMYCIN HCL 1250 MG/250ML IV SOLN
1250.0000 mg | INTRAVENOUS | Status: AC
  Administered 2020-04-16: 1250 mg via INTRAVENOUS
  Filled 2020-04-15: qty 250

## 2020-04-15 MED ORDER — BISACODYL 5 MG PO TBEC
5.0000 mg | DELAYED_RELEASE_TABLET | Freq: Once | ORAL | Status: AC
  Administered 2020-04-15: 5 mg via ORAL
  Filled 2020-04-15: qty 1

## 2020-04-15 MED ORDER — SODIUM CHLORIDE 0.9 % IV SOLN
INTRAVENOUS | Status: DC
  Filled 2020-04-15: qty 30

## 2020-04-15 MED ORDER — CHLORHEXIDINE GLUCONATE 0.12 % MT SOLN
15.0000 mL | Freq: Once | OROMUCOSAL | Status: AC
  Administered 2020-04-16: 15 mL via OROMUCOSAL
  Filled 2020-04-15: qty 15

## 2020-04-15 MED ORDER — PHENYLEPHRINE HCL-NACL 20-0.9 MG/250ML-% IV SOLN
30.0000 ug/min | INTRAVENOUS | Status: AC
  Administered 2020-04-16: 20 ug/min via INTRAVENOUS
  Filled 2020-04-15: qty 250

## 2020-04-15 MED ORDER — SODIUM CHLORIDE 0.9 % IV SOLN
750.0000 mg | INTRAVENOUS | Status: AC
  Administered 2020-04-16: 750 mg via INTRAVENOUS
  Filled 2020-04-15: qty 750

## 2020-04-15 MED ORDER — SENNOSIDES-DOCUSATE SODIUM 8.6-50 MG PO TABS
1.0000 | ORAL_TABLET | Freq: Two times a day (BID) | ORAL | Status: DC | PRN
  Administered 2020-04-15: 1 via ORAL
  Filled 2020-04-15: qty 1

## 2020-04-15 MED ORDER — NITROGLYCERIN IN D5W 200-5 MCG/ML-% IV SOLN
2.0000 ug/min | INTRAVENOUS | Status: DC
  Filled 2020-04-15: qty 250

## 2020-04-15 MED ORDER — SODIUM CHLORIDE 0.9 % IV SOLN
1.5000 g | INTRAVENOUS | Status: AC
  Administered 2020-04-16: 1.5 g via INTRAVENOUS
  Filled 2020-04-15: qty 1.5

## 2020-04-15 MED ORDER — EPINEPHRINE HCL 5 MG/250ML IV SOLN IN NS
0.0000 ug/min | INTRAVENOUS | Status: DC
  Filled 2020-04-15: qty 250

## 2020-04-15 MED ORDER — POTASSIUM CHLORIDE 2 MEQ/ML IV SOLN
80.0000 meq | INTRAVENOUS | Status: DC
  Filled 2020-04-15: qty 40

## 2020-04-15 MED ORDER — INSULIN REGULAR(HUMAN) IN NACL 100-0.9 UT/100ML-% IV SOLN
INTRAVENOUS | Status: AC
  Administered 2020-04-16: 2 [IU]/h via INTRAVENOUS
  Filled 2020-04-15: qty 100

## 2020-04-15 MED ORDER — PLASMA-LYTE 148 IV SOLN
INTRAVENOUS | Status: DC
  Filled 2020-04-15: qty 2.5

## 2020-04-15 MED ORDER — TEMAZEPAM 15 MG PO CAPS
15.0000 mg | ORAL_CAPSULE | Freq: Once | ORAL | Status: AC | PRN
  Administered 2020-04-15: 15 mg via ORAL
  Filled 2020-04-15: qty 1

## 2020-04-15 MED ORDER — METOPROLOL TARTRATE 12.5 MG HALF TABLET
12.5000 mg | ORAL_TABLET | Freq: Once | ORAL | Status: AC
  Administered 2020-04-16: 12.5 mg via ORAL
  Filled 2020-04-15: qty 1

## 2020-04-15 MED ORDER — MAGNESIUM SULFATE 50 % IJ SOLN
40.0000 meq | INTRAMUSCULAR | Status: DC
  Filled 2020-04-15: qty 9.85

## 2020-04-15 MED ORDER — POLYETHYLENE GLYCOL 3350 17 G PO PACK
17.0000 g | PACK | Freq: Two times a day (BID) | ORAL | Status: DC | PRN
  Administered 2020-04-15: 17 g via ORAL
  Filled 2020-04-15: qty 1

## 2020-04-15 MED ORDER — NOREPINEPHRINE 4 MG/250ML-% IV SOLN
0.0000 ug/min | INTRAVENOUS | Status: AC
  Administered 2020-04-16: 4 ug/min via INTRAVENOUS
  Filled 2020-04-15: qty 250

## 2020-04-15 MED ORDER — TRANEXAMIC ACID (OHS) BOLUS VIA INFUSION
15.0000 mg/kg | INTRAVENOUS | Status: AC
  Administered 2020-04-16: 1219.5 mg via INTRAVENOUS
  Filled 2020-04-15: qty 1220

## 2020-04-15 NOTE — Progress Notes (Signed)
[place] for IV Heparin Indication: atrial fibrillation  Allergies  Allergen Reactions  . Atorvastatin Other (See Comments)    Urinary retention   . Nebivolol Diarrhea    Patient Measurements: Weight: 81.3 kg (179 lb 3.7 oz) Heparin Dosing Weight: 81.3 kg  Vital Signs: BP: 134/86 (05/26 1155)  Labs: Recent Labs    04/13/20 0418 04/13/20 0418 04/13/20 1940 04/14/20 0404 04/14/20 0404 04/14/20 1331 04/14/20 1331 04/14/20 1855 04/15/20 1012 04/15/20 1842  HGB 7.5*   < >  --  6.7*   < > 6.5*   < > 8.3* 9.1*  --   HCT 24.0*   < >  --  21.9*   < > 21.7*  --  26.3* 29.6*  --   PLT 383  --   --  362  --   --   --   --  372  --   APTT  --    < > 43* 56*   < > 54*  --   --  39* 56*  HEPARINUNFRC  --   --  >2.20* 1.88*  --   --   --   --  0.56  --   CREATININE 1.43*  --   --  1.48*  --   --   --   --  1.45*  --    < > = values in this interval not displayed.    Estimated Creatinine Clearance: 51 mL/min (A) (by C-G formula based on SCr of 1.45 mg/dL (H)).   Medical History: Past Medical History:  Diagnosis Date  . Abnormal LFTs (liver function tests) 10/31/2016  . Abnormal nuclear stress test 05/03/2018  . Acid reflux 02/15/2018   not at present  . Acute otitis externa of left ear 11/13/2019  . Acute suppurative otitis media of left ear without spontaneous rupture of tympanic membrane 11/13/2019  . Anemia    low iron  . Aortic stenosis 05/03/2018  . Arthritis 02/15/2018   Overview:  shoulder  . Cancer ([place])    skin cancer on left wrist  . Cervical myelopathy ([place]) 04/22/2019  . Conductive hearing loss of left ear 11/13/2019  . Coronary artery disease involving native coronary artery of native heart without angina pectoris 05/30/2016  . Coronary artery disease of native artery of native heart with stable angina pectoris ([place])    pt. denies  . Diabetes mellitus due to underlying condition with unspecified complications ([place]) [id number]   . Dyslipidemia 05/30/2016  . Dyspnea    upon exertion,  as of 06/05/09 not having any recent chest pain  . Dysrhythmia    a fib  . Essential hypertension 05/30/2016  . Heart murmur    Has had an Aortic valve replacement  . Hyperactive gag reflex 02/15/2018  . Hypothyroidism   . Intermittent claudication ([place]) 10/31/2018  . Lumbar stenosis with neurogenic claudication 06/11/2019  . Macular degeneration 02/15/2018  . PAF (paroxysmal atrial fibrillation) ([place]) 05/30/2016  . Pre-operative cardiovascular examination 01/03/2020  . Preoperative cardiovascular examination 04/17/2019  . Pseudogout of left knee 03/21/2020  . Renal insufficiency 01/17/2020  . S/P aortic valve replacement 01/17/2020  . 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 Pt had TEE-DCCV on 04/13/20, which revealed evolved  aortic root abscess with vegetations on leaflet of prosthetic valve. Pharmacy consulted to dose IV heparin pending evaluation for valve replacement on 5/27.   aPTT ~6 hrs after heparin infusion was increased to 1350 units/hr was 56 sec, which is below the goal range for this pt. Hgb  6.5>>9.1 (S/P PRBCs), platelets 372. Per RN, no issues with IV or bleeding observed.  Pt is scheduled for repeat aortic valve and aortic root replacement using homograft tomorrow, 5/27.  Goal of Therapy:  aPTT 66-85 sec Heparin level = 0.3-0.5 units/ml Monitor platelets by anticoagulation protocol: Yes   Plan:  Increase heparin infusion to 1450 units/hr Check aPTT in 6 hrs Monitor daily aPTT, heparin level, CBC Monitor for signs/symptoms of bleeding F/U after surgery tomorrow  Gillermina Hu, PharmD, BCPS, [facility] Clinical Pharmacist 04/16/20, 19:45 PM

## 2020-04-15 NOTE — Progress Notes (Signed)
Procedure(s) (LRB): REDO STERNOTOMY (N/A) ASCENDING AORTIC ROOT REPLACEMENT - HOMOGRAFT (N/A) TRANSESOPHAGEAL ECHOCARDIOGRAM (TEE) (N/A) Subjective:  Only complaint is back pain. Hurts to sit up in bed this am.  Objective: Vital signs in last 24 hours: Temp:  [97.8 F (36.6 C)-100 F (37.8 C)] 98.3 F (36.8 C) (05/26 0735) Pulse Rate:  [68-91] 75 (05/26 0735) Cardiac Rhythm: Normal sinus rhythm;Bundle branch block (05/26 0659) Resp:  [14-18] 18 (05/26 0444) BP: (102-148)/(58-78) 128/69 (05/26 0735) SpO2:  [97 %-100 %] 100 % (05/26 0735)  Hemodynamic parameters for last 24 hours:    Intake/Output from previous day: 05/25 0701 - 05/26 0700 In: 1255.2 [P.O.:240; I.V.:185.9; Blood:315; IV Piggyback:514.3] Out: 2300 [Urine:2300] Intake/Output this shift: No intake/output data recorded.  General appearance: alert and cooperative Heart: regular rate and rhythm and no murmur Lungs: clear to auscultation bilaterally Extremities: no edema  Lab Results: Recent Labs    04/13/20 0418 04/13/20 0418 04/14/20 0404 04/14/20 0404 04/14/20 1331 04/14/20 1855  WBC 7.9  --  8.7  --   --   --   HGB 7.5*   < > 6.7*   < > 6.5* 8.3*  HCT 24.0*   < > 21.9*   < > 21.7* 26.3*  PLT 383  --  362  --   --   --    < > = values in this interval not displayed.   BMET:  Recent Labs    04/13/20 0418 04/14/20 0404  NA 139 137  K 3.9 3.9  CL 101 99  CO2 26 26  GLUCOSE 168* 182*  BUN 21 19  CREATININE 1.43* 1.48*  CALCIUM 8.8* 8.6*    PT/INR: No results for input(s): LABPROT, INR in the last 72 hours. ABG No results found for: PHART, HCO3, TCO2, ACIDBASEDEF, O2SAT CBG (last 3)  Recent Labs    04/14/20 1600 04/14/20 2105 04/15/20 0733  GLUCAP 114* 101* 158*   ADDENDUM REPORT: 04/14/2020 20:07  CLINICAL DATA:  Endocarditis/Aortic root abscess/Prosthetic aortic valve.  EXAM: Cardiac TAVR CT  TECHNIQUE: The patient was scanned on a [employer]. A 120  kV retrospective scan was triggered in the descending thoracic aorta at 111 HU's. Gantry rotation speed was 250 msecs and collimation was .6 mm. No beta blockade or nitro were given. The 3D data set was reconstructed in 5% intervals of the R-R cycle. Systolic and diastolic phases were analyzed on a dedicated work station using MPR, MIP and VRT modes. The patient received 80 cc of contrast.  FINDINGS: Image quality: Average.  Noise artifact is: There is cardiac motion (PVC) and respiratory artifact.  Aortic Valve: There is a 23 mm Magna ease bioprosthetic valve present in the aortic position. The leaflet of the LCC appears thickened with likely vegetation. There is an aortic root abscess with a large pseudoaneurysm that measures 30 mm x 10 mm located off the [place] of the aortic root with extension anteriorly in front of the RCC. There is a fistulous connection between the pseudoaneurysm and the LVOT anterior to the RCC of the aortic root.  Coronary Arteries: Normal coronary origin. Right dominance. CAC score not performed due to prior CABG. Please refer to recent cardiac cath for definitive assessment of coronary arteries and bypass grafts.  Bypass Grafts: A patent LIMA to mid LAD is seen. No other vein grafts observed. At least one is occluded, likely 2, on the anterior aspect of the aorta.  Left main: The left main is a large caliber vessel with  a normal take off from the left coronary cusp that bifurcates to form a left anterior descending artery and a left circumflex artery. There is minimal calcified plaque (<25%).  Left anterior descending artery: The LAD is 100% occluded proximally.  Left circumflex artery: The LCX is non-dominant. The proximal LCX contains mild calcified plaque (25-49%). Distal evaluation is limited by artifact.  Right coronary artery: The RCA is dominant with normal take off from the right coronary cusp. There is mild to moderate heavily  calcified plaque throughout. Vessel severity is unable to be determined. The RCA terminates as PDA and PLV branches with heavily calcified plaque.  Cardiac Morphology:  Right Atrium: Right atrial size is within normal limits.  Right Ventricle: The right ventricular cavity is within normal limits.  Left Atrium: Left atrial size is normal in size with no left atrial appendage filling defect.  Left Ventricle: The ventricular cavity size is within normal limits.  Pulmonary arteries: Normal in size without proximal filling defect.  Pulmonary veins: Normal pulmonary venous drainage.  Pericardium: Normal thickness with no significant effusion or calcium present.  Mitral Valve: The mitral valve is normal structure without significant calcification.  Extra-cardiac findings: See attached radiology report for non-cardiac structures.  IMPRESSION: 1. Prosthetic valve (23 mm Magna ease) endocarditis with leaflet thickening concerning for vegetation.  2. 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  3. Fistulous connection between the pseudoaneurysm and LVOT described above.  3. 3-vessel CAD with patent LIMA-LAD.  [place] T. 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 Plan surgical repair in am with aortic root replacement using a homograft. Pt and wife have no further questions. CMET pending from this am.   LOS: 2 days    Gaye Pollack 04/15/2020

## 2020-04-15 NOTE — Progress Notes (Signed)
[facility]  Date of Admission:  04/13/2020      Total days of antibiotics    ASSESSMENT: Johnathan Murray a 69 y.o.malewith disseminated enterococcus faecalis infection involving acute discitis L4-5, bilateral septic arthritis to knees (s/p I&D 03/24/20) and aortic root abscess with vegetation prosthetic valve in the setting of ongoing intermittent fevers. He is scheduled for surgery with Dr. Cyndia Bent for valve/root replacement tomorrow.  Will follow micro from valve tissue/abscess, presuming same enterococcus faecalis that has been found at multiple other sites.   Will continue dual therapy and follow along with surgery timing. PICC is out. Blood cultures repeated today with fevers ongoing.    PLAN: 1. Continue IV ampicillin + ceftriaxone  2. For valve/root surgery tomorrow 3. Follow up valve micro/path     Active Problems:   Bacteremia due to Enterococcus   Aortic valve abscess   Pseudogout of left knee   Septic arthritis (HCC)   Discitis   Prosthetic valve endocarditis (HCC)   Endocarditis   . sodium chloride   Intravenous Once  . amiodarone  400 mg Oral BID  . vitamin C  500 mg Oral Daily  . Chlorhexidine Gluconate Cloth  6 each Topical Daily  . docusate sodium  100 mg Oral BID  . [START ON 04/16/2020] epinephrine  0-10 mcg/min Intravenous To OR  . [START ON 04/16/2020] heparin-papaverine-plasmalyte irrigation   Irrigation To OR  . insulin aspart  0-9 Units Subcutaneous TID WC  . insulin aspart  3 Units Subcutaneous TID WC  . insulin glargine  10 Units Subcutaneous BID  . [START ON 04/16/2020] insulin   Intravenous To OR  . levothyroxine  25 mcg Oral QAC breakfast  . [START ON 04/16/2020] magnesium sulfate  40 mEq Other To OR  . oxyCODONE  10 mg Oral Q12H  . [START ON 04/16/2020] phenylephrine  30-200 mcg/min Intravenous To OR  . [START ON 04/16/2020] potassium chloride  80 mEq Other To OR  . pregabalin  100 mg Oral TID  .  [START ON 04/16/2020] tranexamic acid  15 mg/kg Intravenous To OR  . [START ON 04/16/2020] tranexamic acid  2 mg/kg Intracatheter To OR    SUBJECTIVE: Going for surgery tomorrow with Dr. Cyndia Bent.    Review of Systems: Review of Systems  Constitutional: Positive for chills, diaphoresis and fever.  Respiratory: Negative for cough and shortness of breath.   Cardiovascular: Positive for palpitations. Negative for chest pain.  Genitourinary: Negative for dysuria.  Musculoskeletal: Positive for back pain and joint pain.  Skin: Negative for rash.    Allergies  Allergen Reactions  . Atorvastatin Other (See Comments)    Urinary retention   . Nebivolol Diarrhea    OBJECTIVE: Vitals:   04/14/20 2106 04/15/20 0020 04/15/20 0444 04/15/20 0735  BP: (!) 144/78 127/66 126/76 128/69  Pulse: 85 91 79 75  Resp: 18 17 18    Temp: 98.2 F (36.8 C) 100 F (37.8 C) 98.4 F (36.9 C) 98.3 F (36.8 C)  TempSrc: Oral Oral Oral Oral  SpO2: 100% 97% 100% 100%  Weight:       Body mass index is 25.72 kg/m.   Physical Exam Vitals reviewed.  HENT:     Mouth/Throat:     Mouth: Mucous membranes are moist.     Pharynx: Oropharynx is clear.  Eyes:     General: No scleral icterus.    Pupils: Pupils are equal, round, and reactive to light.  Cardiovascular:     Rate and Rhythm: Normal rate and regular rhythm.     Heart sounds: No murmur.  Pulmonary:     Effort: Pulmonary effort is normal.     Breath sounds: Normal breath sounds.  Neurological:     Mental Status: He is alert.     Lab Results Lab Results  Component Value Date   WBC 10.4 04/15/2020   HGB 9.1 (L) 04/15/2020   HCT 29.6 (L) 04/15/2020   MCV 92.5 04/15/2020   PLT 372 04/15/2020    Lab Results  Component Value Date   CREATININE 1.45 (H) 04/15/2020   BUN 17 04/15/2020   NA 136 04/15/2020   K 4.2 04/15/2020   CL 99 04/15/2020   CO2 25 04/15/2020    Lab Results  Component Value Date   ALT 68 (H) 04/15/2020   AST 37  04/15/2020   ALKPHOS 92 04/15/2020   BILITOT 0.6 04/15/2020     Microbiology: Recent Results (from the past 240 hour(s))  Aerobic/Anaerobic Culture (surgical/deep wound)     Status: None   Collection Time: 04/07/20  9:20 AM   Specimen: Abscess  Result Value Ref Range Status   Specimen Description ABSCESS L4/L5 DISC SPACE  Final   Special Requests Normal  Final   Gram Stain   Final    MODERATE WBC PRESENT, PREDOMINANTLY PMN NO ORGANISMS SEEN Performed at [facility], [address], [place], [place] [address]    Culture RARE ENTEROCOCCUS FAECALIS  Final   Report Status 04/13/2020 FINAL  Final   Organism ID, Bacteria ENTEROCOCCUS FAECALIS  Final      Susceptibility   Enterococcus faecalis - MIC*    AMPICILLIN <=2 SENSITIVE Sensitive     VANCOMYCIN 1 SENSITIVE Sensitive     GENTAMICIN SYNERGY SENSITIVE Sensitive     * RARE ENTEROCOCCUS FAECALIS  Culture, fungus without smear     Status: None (Preliminary result)   Collection Time: 04/07/20  9:20 AM   Specimen: Abscess  Result Value Ref Range Status   Specimen Description ABSCESS L4/L5 DISC SPACE  Final   Special Requests Normal  Final   Culture   Final    NO FUNGUS ISOLATED AFTER 8 DAYS Performed at [facility], [place] [address], [place], [place] [address]    Report Status PENDING  Incomplete  Stat Gram stain     Status: None   Collection Time: 04/08/20  4:01 PM   Specimen: Body Fluid  Result Value Ref Range Status   Specimen Description FLUID SYNOVIAL LEFT KNEE  Final   Special Requests NONE  Final   Gram Stain   Final    ABUNDANT WBC PRESENT,BOTH PMN AND MONONUCLEAR NO ORGANISMS SEEN Performed at [facility], [address], [place], [place] [address]    Report Status 04/08/2020 FINAL  Final  Body fluid culture     Status: None   Collection Time: 04/08/20  4:01 PM   Specimen: Body Fluid  Result Value Ref Range Status   Specimen Description FLUID SYNOVIAL LEFT KNEE  Final   Special Requests NONE   Final   Gram Stain   Final    ABUNDANT WBC PRESENT, PREDOMINANTLY PMN NO ORGANISMS SEEN    Culture   Final    NO GROWTH 3 DAYS Performed at [facility], [address], [place], [place] [address]    Report Status 04/11/2020 FINAL  Final  Culture, Urine     Status:  None   Collection Time: 04/11/20 10:45 PM   Specimen: Urine, Clean Catch  Result Value Ref Range Status   Specimen Description URINE, CLEAN CATCH  Final   Special Requests NONE  Final   Culture   Final    NO GROWTH Performed at [facility], [address], [place], [place] [address]    Report Status 04/13/2020 FINAL  Final     Janene Madeira, MSN, NP-C [facility] [email or web address] Pager: [phone] Office: [phone] [place]: [phone]

## 2020-04-15 NOTE — Progress Notes (Signed)
Pre-CABG testing has been completed. Preliminary results can be found in CV Proc through chart review.   04/15/20 2:04 PM Johnathan Murray RVT

## 2020-04-15 NOTE — Progress Notes (Signed)
Patient discharged to [facility]. All needs met and patient resting comfortable in his bed leaving room.

## 2020-04-15 NOTE — Progress Notes (Signed)
[facility] for IV Heparin Indication: atrial fibrillation  Allergies  Allergen Reactions  . Atorvastatin Other (See Comments)    Urinary retention   . Nebivolol Diarrhea    Patient Measurements: Weight: 81.3 kg (179 lb 3.7 oz) Heparin Dosing Weight: 81.3 kg  Vital Signs: Temp: 98.3 F (36.8 C) (05/26 0735) Temp Source: Oral (05/26 0735) BP: 128/69 (05/26 0735) Pulse Rate: 75 (05/26 0735)  Labs: Recent Labs    04/13/20 0418 04/13/20 0418 04/13/20 1940 04/14/20 0404 04/14/20 0404 04/14/20 1331 04/14/20 1331 04/14/20 1855 04/15/20 1012  HGB 7.5*   < >  --  6.7*   < > 6.5*   < > 8.3* 9.1*  HCT 24.0*   < >  --  21.9*   < > 21.7*  --  26.3* 29.6*  PLT 383  --   --  362  --   --   --   --  372  APTT  --    < > 43* 56*  --  54*  --   --  39*  HEPARINUNFRC  --   --  >2.20* 1.88*  --   --   --   --  0.56  CREATININE 1.43*  --   --  1.48*  --   --   --   --  1.45*   < > = values in this interval not displayed.    Estimated Creatinine Clearance: 51 mL/min (A) (by C-G formula based on SCr of 1.45 mg/dL (H)).   Medical History: Past Medical History:  Diagnosis Date  . Abnormal LFTs (liver function tests) 10/31/2016  . Abnormal nuclear stress test 05/03/2018  . Acid reflux 02/15/2018   not at present  . Acute otitis externa of left ear 11/13/2019  . Acute suppurative otitis media of left ear without spontaneous rupture of tympanic membrane 11/13/2019  . Anemia    low iron  . Aortic stenosis 05/03/2018  . Arthritis 02/15/2018   Overview:  shoulder  . Cancer ([place])    skin cancer on left wrist  . Cervical myelopathy ([place]) 04/22/2019  . Conductive hearing loss of left ear 11/13/2019  . Coronary artery disease involving native coronary artery of native heart without angina pectoris 05/30/2016  . Coronary artery disease of native artery of native heart with stable angina pectoris ([place])    pt. denies  . Diabetes mellitus due to underlying  condition with unspecified complications ([place]) [id number]  . Dyslipidemia 05/30/2016  . Dyspnea    upon exertion,  as of 06/05/09 not having any recent chest pain  . Dysrhythmia    a fib  . Essential hypertension 05/30/2016  . Heart murmur    Has had an Aortic valve replacement  . Hyperactive gag reflex 02/15/2018  . Hypothyroidism   . Intermittent claudication ([place]) 10/31/2018  . Lumbar stenosis with neurogenic claudication 06/11/2019  . Macular degeneration 02/15/2018  . PAF (paroxysmal atrial fibrillation) ([place]) 05/30/2016  . Pre-operative cardiovascular examination 01/03/2020  . Preoperative cardiovascular examination 04/17/2019  . Pseudogout of left knee 03/21/2020  . Renal insufficiency 01/17/2020  . S/P aortic valve replacement 01/17/2020  . Tinnitus of left ear 11/13/2019    Assessment: 68 yr old male with prosthetic valve endocarditis, aortic root  abscess, disciitis, septic arthritis, enterococcal bacteremia with  atrial fibrillation, on apixaban PTA and continuing on apixaban while inpatient (last dose: apixaban 5 mg PO at 0850 AM today). Pt had TEE-DCCV today (04/13/20), which revealed evolved aortic root  abscess with vegetations on leaflet of prosthetic valve. Pharmacy dose IV heparin pending evaluation for CABG on 5/27 -aPTT= 54 and below goal -Hg 6.5>>9.1 (s/p PRBC)    Goal of Therapy:  aPTT 66-85 sec Heparin level = 0.3-0.5 Monitor platelets by anticoagulation protocol: Yes   Plan:  -Increase heparin to 1350 units/hr -aPTT in 6hrs -Daily aPTT/HL  Hildred Laser, PharmD Clinical Pharmacist **Pharmacist phone directory can now be found on [email or web address] ([email or web address]).  Listed under [place].

## 2020-04-15 NOTE — Progress Notes (Signed)
Progress Note  Patient Name: Johnathan Murray Date of Encounter: 04/15/2020  Primary Cardiologist: Jenean Lindau, MD  Subjective   Having significant back pain this AM otherwise doing well. Surgery planned for tomorrow morning. I reviewed the images of his CT with him from yesterday and pointed out the abscess. Wife is very concerned about the risk of recurrent infection. Patient asking if his brother can visit--hasn't seen him in a month, and he is worried he won't make it out of surgery and will not see him again. We discussed this today.  Inpatient Medications    Scheduled Meds: . sodium chloride   Intravenous Once  . amiodarone  400 mg Oral BID  . vitamin C  500 mg Oral Daily  . Chlorhexidine Gluconate Cloth  6 each Topical Daily  . docusate sodium  100 mg Oral BID  . [START ON 04/16/2020] epinephrine  0-10 mcg/min Intravenous To OR  . [START ON 04/16/2020] heparin-papaverine-plasmalyte irrigation   Irrigation To OR  . insulin aspart  0-9 Units Subcutaneous TID WC  . insulin aspart  3 Units Subcutaneous TID WC  . insulin glargine  10 Units Subcutaneous BID  . [START ON 04/16/2020] insulin   Intravenous To OR  . levothyroxine  25 mcg Oral QAC breakfast  . [START ON 04/16/2020] magnesium sulfate  40 mEq Other To OR  . oxyCODONE  10 mg Oral Q12H  . [START ON 04/16/2020] phenylephrine  30-200 mcg/min Intravenous To OR  . [START ON 04/16/2020] potassium chloride  80 mEq Other To OR  . pregabalin  100 mg Oral TID  . [START ON 04/16/2020] tranexamic acid  15 mg/kg Intravenous To OR  . [START ON 04/16/2020] tranexamic acid  2 mg/kg Intracatheter To OR   Continuous Infusions: . ampicillin IVPB 2 gram/NS 100 mL (Mini-Bag Plus) 2 g (04/15/20 0529)  . cefTRIAXone (ROCEPHIN) IVPB 2 gram/100 mL NS (Mini-Bag Plus) 2 g (04/14/20 2251)  . [START ON 04/16/2020] cefUROXime (ZINACEF)  IV    . [START ON 04/16/2020] cefUROXime (ZINACEF)  IV    . [START ON 04/16/2020] dexmedetomidine    .  [START ON 04/16/2020] heparin 30,000 units/NS 1000 mL solution for CELLSAVER    . heparin 1,200 Units/hr (04/15/20 0100)  . [START ON 04/16/2020] milrinone    . [START ON 04/16/2020] nitroGLYCERIN    . [START ON 04/16/2020] norepinephrine    . [START ON 04/16/2020] tranexamic acid (CYKLOKAPRON) infusion ([facility])    . [START ON 04/16/2020] vancomycin     PRN Meds: acetaminophen, cyclobenzaprine, metoprolol tartrate, oxyCODONE, polyethylene glycol, senna-docusate   Vital Signs    Vitals:   04/14/20 2106 04/15/20 0020 04/15/20 0444 04/15/20 0735  BP: (!) 144/78 127/66 126/76 128/69  Pulse: 85 91 79 75  Resp: 18 17 18    Temp: 98.2 F (36.8 C) 100 F (37.8 C) 98.4 F (36.9 C) 98.3 F (36.8 C)  TempSrc: Oral Oral Oral Oral  SpO2: 100% 97% 100% 100%  Weight:        Intake/Output Summary (Last 24 hours) at 04/15/2020 1146 Last data filed at 04/15/2020 0646 Gross per 24 hour  Intake 1015.23 ml  Output 1850 ml  Net -834.77 ml   Filed Weights   04/13/20 1942 04/14/20 0431  Weight: 81.3 kg 81.3 kg    Physical Exam   GEN: Well nourished, well developed in no acute distress HEENT: Normal, moist mucous membranes NECK: No JVD CARDIAC: regular rhythm, normal S1 and S2, no rubs or  gallops. 3/6 SE murmur. VASCULAR: Radial and DP pulses 2+ bilaterally. No carotid bruits RESPIRATORY:  Clear to auscultation without rales, wheezing or rhonchi  ABDOMEN: Soft, non-tender, non-distended MUSCULOSKELETAL:  Tender in multiple joints and back SKIN: Warm and dry, no edema NEUROLOGIC:  Alert and oriented x 3. Reports generalized weakness but no focal symptoms PSYCHIATRIC:  Normal affect   Labs    Chemistry Recent Labs  Lab 04/09/20 0411 04/09/20 0411 04/13/20 0418 04/14/20 0404 04/15/20 1012  NA 135   < > 139 137 136  K 3.8   < > 3.9 3.9 4.2  CL 100   < > 101 99 99  CO2 27   < > 26 26 25   GLUCOSE 163*   < > 168* 182* 121*  BUN 19   < > 21 19 17   CREATININE 1.40*   < > 1.43* 1.48* 1.45*    CALCIUM 8.7*   < > 8.8* 8.6* 8.6*  PROT 6.1*  --   --   --  6.1*  ALBUMIN 1.9*  --   --   --  2.0*  AST 38  --   --   --  37  ALT 76*  --   --   --  68*  ALKPHOS 93  --   --   --  92  BILITOT 0.9  --   --   --  0.6  GFRNONAA 52*   < > 50* 48* 49*  GFRAA 60*   < > 58* 56* 57*  ANIONGAP 8   < > 12 12 12    < > = values in this interval not displayed.     Hematology Recent Labs  Lab 04/13/20 0418 04/13/20 0418 04/14/20 0404 04/14/20 0404 04/14/20 1331 04/14/20 1855 04/15/20 1012  WBC 7.9  --  8.7  --   --   --  10.4  RBC 2.55*  --  2.30*  --   --   --  3.20*  HGB 7.5*   < > 6.7*   < > 6.5* 8.3* 9.1*  HCT 24.0*   < > 21.9*   < > 21.7* 26.3* 29.6*  MCV 94.1  --  95.2  --   --   --  92.5  MCH 29.4  --  29.1  --   --   --  28.4  MCHC 31.3  --  30.6  --   --   --  30.7  RDW 15.9*  --  16.3*  --   --   --  16.5*  PLT 383  --  362  --   --   --  372   < > = values in this interval not displayed.    Cardiac EnzymesNo results for input(s): TROPONINI in the last 168 hours. No results for input(s): TROPIPOC in the last 168 hours.   BNPNo results for input(s): BNP, PROBNP in the last 168 hours.   DDimer No results for input(s): DDIMER in the last 168 hours.   Radiology    CT CORONARY MORPH W/CTA COR W/SCORE W/CA W/CM &/OR WO/CM  Addendum Date: 04/14/2020   ADDENDUM REPORT: 04/14/2020 20:07 CLINICAL DATA:  Endocarditis/Aortic root abscess/Prosthetic aortic valve. EXAM: Cardiac TAVR CT TECHNIQUE: The patient was scanned on a [employer]. A 120 kV retrospective scan was triggered in the descending thoracic aorta at 111 HU's. Gantry rotation speed was 250 msecs and collimation was .6 mm. No beta blockade or nitro were given. The 3D data  set was reconstructed in 5% intervals of the R-R cycle. Systolic and diastolic phases were analyzed on a dedicated work station using MPR, MIP and VRT modes. The patient received 80 cc of contrast. FINDINGS: Image quality: Average. Noise artifact  is: There is cardiac motion (PVC) and respiratory artifact. Aortic Valve: There is a 23 mm Magna ease bioprosthetic valve present in the aortic position. The leaflet of the LCC appears thickened with likely vegetation. There is an aortic root abscess with a large pseudoaneurysm that measures 30 mm x 10 mm located off the [place] of the aortic root with extension anteriorly in front of the RCC. There is a fistulous connection between the pseudoaneurysm and the LVOT anterior to the RCC of the aortic root. Coronary Arteries: Normal coronary origin. Right dominance. CAC score not performed due to prior CABG. Please refer to recent cardiac cath for definitive assessment of coronary arteries and bypass grafts. Bypass Grafts: A patent LIMA to mid LAD is seen. No other vein grafts observed. At least one is occluded, likely 2, on the anterior aspect of the aorta. Left main: The left main is a large caliber vessel with a normal take off from the left coronary cusp that bifurcates to form a left anterior descending artery and a left circumflex artery. There is minimal calcified plaque (<25%). Left anterior descending artery: The LAD is 100% occluded proximally. Left circumflex artery: The LCX is non-dominant. The proximal LCX contains mild calcified plaque (25-49%). Distal evaluation is limited by artifact. Right coronary artery: The RCA is dominant with normal take off from the right coronary cusp. There is mild to moderate heavily calcified plaque throughout. Vessel severity is unable to be determined. The RCA terminates as PDA and PLV branches with heavily calcified plaque. Cardiac Morphology: Right Atrium: Right atrial size is within normal limits. Right Ventricle: The right ventricular cavity is within normal limits. Left Atrium: Left atrial size is normal in size with no left atrial appendage filling defect. Left Ventricle: The ventricular cavity size is within normal limits. Pulmonary arteries: Normal in size without  proximal filling defect. Pulmonary veins: Normal pulmonary venous drainage. Pericardium: Normal thickness with no significant effusion or calcium present. Mitral Valve: The mitral valve is normal structure without significant calcification. Extra-cardiac findings: See attached radiology report for non-cardiac structures. IMPRESSION: 1. Prosthetic valve (23 mm Magna ease) endocarditis with leaflet thickening concerning for vegetation. 2. 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 3. Fistulous connection between the pseudoaneurysm and LVOT described above. 3. 3-vessel CAD with patent LIMA-LAD. [place] T. Audie Box, MD Electronically Signed   By: Eleonore Chiquito   On: 04/14/2020 20:07   Result Date: 04/14/2020 EXAM: OVER-READ INTERPRETATION  CT CHEST The following report is an over-read performed by radiologist Dr. Vinnie Langton of [facility], [place] on 04/14/2020. This over-read does not include interpretation of cardiac or coronary anatomy or pathology. The coronary calcium score/coronary CTA interpretation by the cardiologist is attached. COMPARISON:  None. FINDINGS: Within the visualized portions of the thorax there are no suspicious appearing pulmonary nodules or masses, there is no acute consolidative airspace disease, no pleural effusions, no pneumothorax and no lymphadenopathy. Visualized portions of the upper abdomen are unremarkable. There are no aggressive appearing lytic or blastic lesions noted in the visualized portions of the skeleton. Status post median sternotomy for aortic valve replacement. IMPRESSION: No significant incidental noncardiac findings are noted. Electronically Signed: By: Vinnie Langton M.D. On: 04/14/2020 13:26  Telemetry    01/17/20 NSR, PR stable from yesterday - Personally Reviewed  ECG    No new tracing as of 04/15/20- Personally Reviewed  Cardiac Studies   TEE 03/26/20: 1. Left ventricular ejection fraction, by  estimation, is 60 to 65%. The  left ventricle has normal function. The left ventricle has no regional  wall motion abnormalities.  2. Right ventricular systolic function is normal. The right ventricular  size is normal. There is normal pulmonary artery systolic pressure.  3. No left atrial/left atrial appendage thrombus was detected.  4. The mitral valve is myxomatous. Moderate mitral valve regurgitation.  No evidence of mitral stenosis.  5. The aortic valve has been repaired/replaced. Aortic valve  regurgitation is not visualized. No aortic stenosis is present. There is a  23 mm Edwards bioprosthetic valve present in the aortic position.  Procedure Date: 08/16/2016. Echo findings are  consistent with normal structure and function of the aortic valve  prosthesis.  6. The inferior vena cava is normal in size with greater than 50%  respiratory variability, suggesting right atrial pressure of 3 mmHg.    Echo 03/22/20: 1. There is no obvious vegetation, however transaortic gradients across  the bioprosthetic valve are increased from the prior study on 01/16/2020,  mean gradient 28 mmHg, previously 16 mmHg. Consider TEE for further  evaluation.  2. Left ventricular ejection fraction, by estimation, is 55 to 60%. The  left ventricle has normal function. The left ventricle has no regional  wall motion abnormalities. There is mild concentric left ventricular  hypertrophy. Left ventricular diastolic  parameters are consistent with Grade I diastolic dysfunction (impaired  relaxation). Elevated left atrial pressure.  3. Right ventricular systolic function is normal. The right ventricular  size is normal.  4. Left atrial size was mildly dilated.  5. The mitral valve is normal in structure. Mild mitral valve  regurgitation. No evidence of mitral stenosis.  6. The aortic valve has been repaired/replaced. Aortic valve  regurgitation is not visualized. No aortic stenosis is present. There  is a  23 mm Edwards-SAPIEN valve present in the aortic position. Aortic valve  mean gradient measures 28.0 mmHg.  7. The inferior vena cava is normal in size with greater than 50%  respiratory variability, suggesting right atrial pressure of 3 mmHg.   CTA 04/14/20 FINDINGS: Image quality: Average.  Noise artifact is: There is cardiac motion (PVC) and respiratory artifact.  Aortic Valve: There is a 23 mm Magna ease bioprosthetic valve present in the aortic position. The leaflet of the LCC appears thickened with likely vegetation. There is an aortic root abscess with a large pseudoaneurysm that measures 30 mm x 10 mm located off the [place] of the aortic root with extension anteriorly in front of the RCC. There is a fistulous connection between the pseudoaneurysm and the LVOT anterior to the RCC of the aortic root.  Coronary Arteries: Normal coronary origin. Right dominance. CAC score not performed due to prior CABG. Please refer to recent cardiac cath for definitive assessment of coronary arteries and bypass grafts.  Bypass Grafts: A patent LIMA to mid LAD is seen. No other vein grafts observed. At least one is occluded, likely 2, on the anterior aspect of the aorta.  Left main: The left main is a large caliber vessel with a normal take off from the left coronary cusp that bifurcates to form a left anterior descending artery and a left circumflex artery. There is minimal calcified plaque (<25%).  Left anterior descending artery:  The LAD is 100% occluded proximally.  Left circumflex artery: The LCX is non-dominant. The proximal LCX contains mild calcified plaque (25-49%). Distal evaluation is limited by artifact.  Right coronary artery: The RCA is dominant with normal take off from the right coronary cusp. There is mild to moderate heavily calcified plaque throughout. Vessel severity is unable to be determined. The RCA terminates as PDA and PLV branches with heavily  calcified plaque.  Cardiac Morphology:  Right Atrium: Right atrial size is within normal limits.  Right Ventricle: The right ventricular cavity is within normal limits.  Left Atrium: Left atrial size is normal in size with no left atrial appendage filling defect.  Left Ventricle: The ventricular cavity size is within normal limits.  Pulmonary arteries: Normal in size without proximal filling defect.  Pulmonary veins: Normal pulmonary venous drainage.  Pericardium: Normal thickness with no significant effusion or calcium present.  Mitral Valve: The mitral valve is normal structure without significant calcification.  Extra-cardiac findings: See attached radiology report for non-cardiac structures.  IMPRESSION: 1. Prosthetic valve (23 mm Magna ease) endocarditis with leaflet thickening concerning for vegetation.  2. 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  3. Fistulous connection between the pseudoaneurysm and LVOT described above.  3. 3-vessel CAD with patent LIMA-LAD.  Patient Profile     69 y.o. male with a history of CAD s/p CABG in 2017 with most recent cath in 01/2020 showing 2/3 grafts occluded, aortic stenosis s/p AVR at time of CABG, paroxysmal atrial fibrillation on Eliquis, hypertension, dyslipidemia, diabetes mellitus, recent cervical discectomywho is currently in CIR following prolonged hospitalization for sepsis. Cardiology consultedfor the evaluation of atrial flutterwith RVR.Found to have endocarditis and aortic root abscess.  Assessment & Plan    1. Atrial flutter with RVR: -On p.o. amiodarone 400 mg twice daily  -s/p TEE-CV 04/13/2020 >>>NSR  -Evidence of endocarditis and abscess>> see plan below -Eliquis stopped 04/13/2020  -Transitioned to heparin>> per pharmacy  2. Endocarditis of aortic valve, with aortic root abscess of prior AVR (2017): -TTE 04/13/2020,found on TEE  04/13/20 -Followed by ID for antibiotic therapy>> ampicillin/ceftriaxone -Followed by [facility] with for aortic valve replacement will be due with aortic root replacement per Dr. Gay Filler 04/14/20 performed which showed pseudoaneurysm at the base of the aortic valve prosthesis -Surgery planned for tomorrow, 04/16/2020 -given the plans for surgery, I am ok if policy allows for his brother to come and visit. He is very nervous that he will not make it out of surgery.  3. CAD s/p CABG -Last LHC 01/2020 with 2/3 grafts occluded with patent LIMA to LAD -No ASA however Eliquis stopped 04/13/2020 in anticipation of cardiac surgery -Placed on IV heparin infusion -Monitor closely for bleeding, has received transfusion as below -Denies anginal symptoms  4.  Hypertension -has been running low -PTA antihypertensives currently on hold  5. Anemia: -Has received PRBC transfusion -Concerning given heparin infusion -Hemoglobin this a.m., 6.5>>>8.3 post transfusion  Buford Dresser, MD, PhD [facility]  [address] [place], [place] [address] [phone] 04/15/2020, 11:46 AM     For questions or updates, please contact   Please consult [email or web address] for contact info under Cardiology/STEMI.

## 2020-04-15 NOTE — Progress Notes (Signed)
PROGRESS NOTE  Johnathan Murray [id number] DOB: 1951-01-19   PCP: Nicoletta Dress, MD  Patient is from: Home  DOA: 04/13/2020 LOS: 2  Brief Narrative / Interim history: 69 year old male with history of previous bioprosthetic AVR in 2017, IDDM-2, paroxysmal A. fib on Eliquis, CAD and HTN He was hospitalized 4/29-5/18 with sepsis due to Enterococcus faecalis bacteremia (no endocarditis), L4-5 osteomyelitis/discitis and septic right knee due to Enterococcus faecalis infection.  He was discharged to CIR on IV ampicillin and ceftriaxone for a total of 7 to 8 weeks through May 20, 2020.  Patient readmitted from CIR on 5/24 with A. fib with RVR atrial fibrillation withRVR.  He underwent TEE-CV on 5/24.  He was also found to have aortic root abscess with 8 mm x 5 mm vegetations on the leaflet of NCC of prosthetic valve.  TEE also concerning for left atrial appendiceal thrombus.  Cardioversion was aborted.  Infectious disease consulted.  Blood cultures obtained.  PICC line removed for line holiday. Cardiothoracic surgery consulted.  Plan is for repeat aortic valve and aortic root replacement using homograft on 5/27.  Patient was transitioned from Eliquis to heparin drip.  Hemoglobin dropped to 6.7.  Transfused 1 unit with appropriate response.  Hemoglobin remained stable.  Subjective: Seen and examined earlier this morning.  No major events overnight of this morning.  No complaints other than back pain mainly with movement and sitting.  Denies back pain with lying flat in bed.  He denies focal neuro symptoms in his legs.  Denies bowel or bladder issues.  He denies chest pain, dyspnea, GI or UTI symptoms.  Objective: Vitals:   04/15/20 0020 04/15/20 0444 04/15/20 0735 04/15/20 1155  BP: 127/66 126/76 128/69 134/86  Pulse: 91 79 75   Resp: 17 18  16   Temp: 100 F (37.8 C) 98.4 F (36.9 C) 98.3 F (36.8 C)   TempSrc: Oral Oral Oral   SpO2: 97% 100% 100%   Weight:         Intake/Output Summary (Last 24 hours) at 04/15/2020 1745 Last data filed at 04/15/2020 1647 Gross per 24 hour  Intake 819.2 ml  Output 3060 ml  Net -2240.8 ml   Filed Weights   04/13/20 1942 04/14/20 0431  Weight: 81.3 kg 81.3 kg    Examination:  GENERAL: No apparent distress.  Nontoxic. HEENT: MMM.  Vision and hearing grossly intact.  NECK: Supple.  No apparent JVD.  RESP:  No IWOB.  Fair aeration bilaterally. CVS:  RRR. Heart sounds normal.  ABD/GI/GU: BS+. Abd soft, NTND.  MSK/EXT:  Moves extremities. No apparent deformity. No edema.  SKIN: no apparent skin lesion or wound NEURO: Awake, alert and oriented appropriately.  Motor 4/5 in BLE.  Otherwise neuro exam is normal. PSYCH: Calm. Normal affect.  Procedures:  5/24-TEE  Microbiology summarized: 5/26-blood cultures NGTD  Assessment & Plan: Prosthetic valve endocarditis Aortic root abscess with large pseudoaneurysm Fistulous connection between pseudoaneurysm and LVOT -Plan for TEE, ascending aortic root replacement with homograft by CTS on 5/27 -Repeat blood cultures on 5/25 NGTD -Continue ceftriaxone and ampicillin -Appreciate ID insight and guidance.  History of Enterococcus faecalis bacteremia L4-L5 osteomyelitis/discitis Septic right knee with Enterococcus faecalis -Hospitalized for this 4/29-5/18 and discharged to CIR on ampicillin through 6/30 -Repeat blood culture on 5/25 NGTD. -IV antibiotics as above  History of 3-vessel CAD/CABG with patent LIMA-LAD: No anginal symptoms. -Continue metoprolol  Atrial appendagal thrombus?  Reported on TEE from  5/24. -On IV heparin  IDDM-2 with  hyperglycemia Recent Labs    04/15/20 0733 04/15/20 1133 04/15/20 1645  GLUCAP 158* 119* 140*  -Continue current regimen -Check hemoglobin A1c  Anemia of chronic disease: Recent baseline Hgb 7-8> 7.5 (admit)>> 6.5>1u> 8.3> 9.1 -Continue monitoring.  Atrial flutter with RVR: Converted to NSR after TEE/CV on 5/24 -On  amiodarone 400 mg twice daily per cardiology -On IV heparin for anticoagulation -Current neurologic managing  Essential hypertension: Normotensive -Continue metoprolol  Hypothyroidism -Continue home Synthroid          DVT prophylaxis: On IV heparin Code Status: Full code Family Communication: Updated patient's wife at bedside. Status is: Inpatient  Remains inpatient appropriate because:Ongoing diagnostic testing needed not appropriate for outpatient work up, IV treatments appropriate due to intensity of illness or inability to take PO, Inpatient level of care appropriate due to severity of illness and Planned cardiothoracic surgery   Dispo: The patient is from: CIR              Anticipated d/c is to: To be determined              Anticipated d/c date is: > 3 days              Patient currently is not medically stable to d/c.        Consultants:  Cardiothoracic surgery Cardiology Infectious disease   Sch Meds:  Scheduled Meds: . sodium chloride   Intravenous Once  . amiodarone  400 mg Oral BID  . vitamin C  500 mg Oral Daily  . [START ON 04/16/2020] chlorhexidine  15 mL Mouth/Throat Once  . Chlorhexidine Gluconate Cloth  6 each Topical Daily  . Chlorhexidine Gluconate Cloth  6 each Topical Once   And  . Chlorhexidine Gluconate Cloth  6 each Topical Once  . [START ON 04/16/2020] diazepam  5 mg Oral Once  . docusate sodium  100 mg Oral BID  . [START ON 04/16/2020] epinephrine  0-10 mcg/min Intravenous To OR  . [START ON 04/16/2020] heparin-papaverine-plasmalyte irrigation   Irrigation To OR  . insulin aspart  0-9 Units Subcutaneous TID WC  . insulin aspart  3 Units Subcutaneous TID WC  . insulin glargine  10 Units Subcutaneous BID  . [START ON 04/16/2020] insulin   Intravenous To OR  . levothyroxine  25 mcg Oral QAC breakfast  . [START ON 04/16/2020] magnesium sulfate  40 mEq Other To OR  . [START ON 04/16/2020] metoprolol tartrate  12.5 mg Oral Once  . oxyCODONE  10  mg Oral Q12H  . [START ON 04/16/2020] phenylephrine  30-200 mcg/min Intravenous To OR  . [START ON 04/16/2020] potassium chloride  80 mEq Other To OR  . pregabalin  100 mg Oral TID  . [START ON 04/16/2020] tranexamic acid  15 mg/kg Intravenous To OR  . [START ON 04/16/2020] tranexamic acid  2 mg/kg Intracatheter To OR   Continuous Infusions: . ampicillin IVPB 2 gram/NS 100 mL (Mini-Bag Plus) 2 g (04/15/20 1713)  . cefTRIAXone (ROCEPHIN) IVPB 2 gram/100 mL NS (Mini-Bag Plus) 2 g (04/15/20 1258)  . [START ON 04/16/2020] cefUROXime (ZINACEF)  IV    . [START ON 04/16/2020] cefUROXime (ZINACEF)  IV    . [START ON 04/16/2020] dexmedetomidine    . [START ON 04/16/2020] heparin 30,000 units/NS 1000 mL solution for CELLSAVER    . heparin 1,350 Units/hr (04/15/20 1530)  . [START ON 04/16/2020] milrinone    . [START ON 04/16/2020] nitroGLYCERIN    . [START ON 04/16/2020] norepinephrine    . [  START ON 04/16/2020] tranexamic acid (CYKLOKAPRON) infusion (OHS)    . [START ON 04/16/2020] vancomycin     PRN Meds:.acetaminophen, cyclobenzaprine, metoprolol tartrate, oxyCODONE, polyethylene glycol, senna-docusate, temazepam  Antimicrobials: Anti-infectives (From admission, onward)   Start     Dose/Rate Route Frequency Ordered Stop   04/16/20 0400  vancomycin (VANCOREADY) IVPB 1250 mg/250 mL     1,250 mg 166.7 mL/hr over 90 Minutes Intravenous To Surgery 04/15/20 1133 04/17/20 0400   04/16/20 0400  cefUROXime (ZINACEF) 1.5 g in sodium chloride 0.9 % 100 mL IVPB     1.5 g 200 mL/hr over 30 Minutes Intravenous To Surgery 04/15/20 1133 04/17/20 0400   04/16/20 0400  cefUROXime (ZINACEF) 750 mg in sodium chloride 0.9 % 100 mL IVPB     750 mg 200 mL/hr over 30 Minutes Intravenous To Surgery 04/15/20 1133 04/17/20 0400   04/13/20 2330  cefTRIAXone (ROCEPHIN) 2 g in sodium chloride 0.9 % 100 mL IVPB     2 g 200 mL/hr over 30 Minutes Intravenous Every 12 hours 04/13/20 2005     04/13/20 2300  ampicillin (OMNIPEN) 2 g  in sodium chloride 0.9 % 100 mL IVPB     2 g 300 mL/hr over 20 Minutes Intravenous Every 6 hours 04/13/20 2005         I have personally reviewed the following labs and images: CBC: Recent Labs  Lab 04/13/20 0418 04/14/20 0404 04/14/20 1331 04/14/20 1855 04/15/20 1012  WBC 7.9 8.7  --   --  10.4  NEUTROABS 5.9  --   --   --  8.2*  HGB 7.5* 6.7* 6.5* 8.3* 9.1*  HCT 24.0* 21.9* 21.7* 26.3* 29.6*  MCV 94.1 95.2  --   --  92.5  PLT 383 362  --   --  372   BMP &GFR Recent Labs  Lab 04/08/20 2132 04/09/20 0411 04/13/20 0418 04/14/20 0404 04/15/20 1012  NA  --  135 139 137 136  K  --  3.8 3.9 3.9 4.2  CL  --  100 101 99 99  CO2  --  27 26 26 25   GLUCOSE  --  163* 168* 182* 121*  BUN  --  19 21 19 17   CREATININE  --  1.40* 1.43* 1.48* 1.45*  CALCIUM  --  8.7* 8.8* 8.6* 8.6*  MG 2.0  --   --   --   --    Estimated Creatinine Clearance: 51 mL/min (A) (by C-G formula based on SCr of 1.45 mg/dL (H)). Liver & Pancreas: Recent Labs  Lab 04/09/20 0411 04/15/20 1012  AST 38 37  ALT 76* 68*  ALKPHOS 93 92  BILITOT 0.9 0.6  PROT 6.1* 6.1*  ALBUMIN 1.9* 2.0*   No results for input(s): LIPASE, AMYLASE in the last 168 hours. No results for input(s): AMMONIA in the last 168 hours. Diabetic: No results for input(s): HGBA1C in the last 72 hours. Recent Labs  Lab 04/14/20 1600 04/14/20 2105 04/15/20 0733 04/15/20 1133 04/15/20 1645  GLUCAP 114* 101* 158* 119* 140*   Cardiac Enzymes: No results for input(s): CKTOTAL, CKMB, CKMBINDEX, TROPONINI in the last 168 hours. No results for input(s): PROBNP in the last 8760 hours. Coagulation Profile: No results for input(s): INR, PROTIME in the last 168 hours. Thyroid Function Tests: No results for input(s): TSH, T4TOTAL, FREET4, T3FREE, THYROIDAB in the last 72 hours. Lipid Profile: No results for input(s): CHOL, HDL, LDLCALC, TRIG, CHOLHDL, LDLDIRECT in the last 72 hours. Anemia Panel: No  results for input(s): VITAMINB12,  FOLATE, FERRITIN, TIBC, IRON, RETICCTPCT in the last 72 hours. Urine analysis:    Component Value Date/Time   COLORURINE YELLOW 04/11/2020 [place] 04/11/2020 2245   LABSPEC 1.009 04/11/2020 2245   PHURINE 7.0 04/11/2020 2245   GLUCOSEU NEGATIVE 04/11/2020 2245   HGBUR NEGATIVE 04/11/2020 2245   BILIRUBINUR NEGATIVE 04/11/2020 2245   KETONESUR NEGATIVE 04/11/2020 2245   PROTEINUR NEGATIVE 04/11/2020 2245   NITRITE NEGATIVE 04/11/2020 2245   LEUKOCYTESUR NEGATIVE 04/11/2020 2245   Sepsis Labs: Invalid input(s): PROCALCITONIN, [facility]  Microbiology: Recent Results (from the past 240 hour(s))  Aerobic/Anaerobic Culture (surgical/deep wound)     Status: None   Collection Time: 04/07/20  9:20 AM   Specimen: Abscess  Result Value Ref Range Status   Specimen Description ABSCESS L4/L5 DISC SPACE  Final   Special Requests Normal  Final   Gram Stain   Final    MODERATE WBC PRESENT, PREDOMINANTLY PMN NO ORGANISMS SEEN Performed at [facility], [address] [place], [place] [address]    Culture RARE ENTEROCOCCUS FAECALIS  Final   Report Status 04/13/2020 FINAL  Final   Organism ID, Bacteria ENTEROCOCCUS FAECALIS  Final      Susceptibility   Enterococcus faecalis - MIC*    AMPICILLIN <=2 SENSITIVE Sensitive     VANCOMYCIN 1 SENSITIVE Sensitive     GENTAMICIN SYNERGY SENSITIVE Sensitive     * RARE ENTEROCOCCUS FAECALIS  Culture, fungus without smear     Status: None (Preliminary result)   Collection Time: 04/07/20  9:20 AM   Specimen: Abscess  Result Value Ref Range Status   Specimen Description ABSCESS L4/L5 DISC SPACE  Final   Special Requests Normal  Final   Culture   Final    NO FUNGUS ISOLATED AFTER 8 DAYS Performed at [facility], [place] [address], [place], [place] [address]    Report Status PENDING  Incomplete  Stat Gram stain     Status: None   Collection Time: 04/08/20  4:01 PM   Specimen: Body Fluid  Result Value Ref Range Status    Specimen Description FLUID SYNOVIAL LEFT KNEE  Final   Special Requests NONE  Final   Gram Stain   Final    ABUNDANT WBC PRESENT,BOTH PMN AND MONONUCLEAR NO ORGANISMS SEEN Performed at [facility], [address], [place], [place] [address]    Report Status 04/08/2020 FINAL  Final  Body fluid culture     Status: None   Collection Time: 04/08/20  4:01 PM   Specimen: Body Fluid  Result Value Ref Range Status   Specimen Description FLUID SYNOVIAL LEFT KNEE  Final   Special Requests NONE  Final   Gram Stain   Final    ABUNDANT WBC PRESENT, PREDOMINANTLY PMN NO ORGANISMS SEEN    Culture   Final    NO GROWTH 3 DAYS Performed at [facility], [address] [place], [place] [address]    Report Status 04/11/2020 FINAL  Final  Culture, Urine     Status: None   Collection Time: 04/11/20 10:45 PM   Specimen: Urine, Clean Catch  Result Value Ref Range Status   Specimen Description URINE, CLEAN CATCH  Final   Special Requests NONE  Final   Culture   Final    NO GROWTH Performed at [facility], [place] [address], [place], [place] [address]    Report Status 04/13/2020 FINAL  Final  Culture, blood (Routine X 2) w Reflex to ID Panel     Status: None (Preliminary result)   Collection Time: 04/15/20 11:59 AM   Specimen: BLOOD LEFT WRIST  Result Value Ref Range Status   Specimen Description BLOOD LEFT WRIST  Final   Special Requests   Final    BOTTLES DRAWN AEROBIC AND ANAEROBIC Blood Culture adequate volume   Culture   Final    NO GROWTH <12 HOURS Performed at [facility], [address], [place], [place] [address]    Report Status PENDING  Incomplete  Culture, blood (Routine X 2) w Reflex to ID Panel     Status: None (Preliminary result)   Collection Time: 04/15/20 12:04 PM   Specimen: BLOOD LEFT HAND  Result Value Ref Range Status   Specimen Description BLOOD LEFT HAND  Final   Special Requests   Final    BOTTLES DRAWN AEROBIC AND ANAEROBIC Blood Culture  adequate volume   Culture   Final    NO GROWTH <12 HOURS Performed at [facility], [place] [address], [place], [place] [address]    Report Status PENDING  Incomplete    Radiology Studies: VAS US DOPPLER PRE CABG  Result Date: 04/15/2020 PREOPERATIVE VASCULAR EVALUATION  Indications:      Pre-CABG. Risk Factors:     Diabetes. Comparison Study: No prior studies. Performing Technologist: Carlos Levering Rvt  Examination Guidelines: A complete evaluation includes B-mode imaging, spectral Doppler, color Doppler, and power Doppler as needed of all accessible portions of each vessel. Bilateral testing is considered an integral part of a complete examination. Limited examinations for reoccurring indications may be performed as noted.  Right Carotid Findings: +----------+--------+--------+--------+-----------------------+--------+           PSV cm/sEDV cm/sStenosisDescribe               Comments +----------+--------+--------+--------+-----------------------+--------+ CCA Prox  87      17              smooth and heterogenous         +----------+--------+--------+--------+-----------------------+--------+ CCA Distal72      20              smooth and heterogenous         +----------+--------+--------+--------+-----------------------+--------+ ICA Prox  60      14              calcific                        +----------+--------+--------+--------+-----------------------+--------+ ICA Distal75      28                                              +----------+--------+--------+--------+-----------------------+--------+ ECA       89      14                                              +----------+--------+--------+--------+-----------------------+--------+ Portions of this table do not appear on this page. +----------+--------+-------+--------+------------+           PSV cm/sEDV cmsDescribeArm Pressure +----------+--------+-------+--------+------------+ [id number]                                   +----------+--------+-------+--------+------------+ +---------+--------+--+--------+-+---------+  VertebralPSV cm/s29EDV cm/s7Antegrade +---------+--------+--+--------+-+---------+ Left Carotid Findings: +----------+--------+--------+--------+-----------------------+--------+           PSV cm/sEDV cm/sStenosisDescribe               Comments +----------+--------+--------+--------+-----------------------+--------+ CCA Prox  89      15              smooth and heterogenous         +----------+--------+--------+--------+-----------------------+--------+ CCA Distal80      27              calcific                        +----------+--------+--------+--------+-----------------------+--------+ ICA Prox  71      24              calcific                        +----------+--------+--------+--------+-----------------------+--------+ ICA Distal85      34                                              +----------+--------+--------+--------+-----------------------+--------+ ECA       78      9                                               +----------+--------+--------+--------+-----------------------+--------+ +----------+--------+--------+--------+------------+ SubclavianPSV cm/sEDV cm/sDescribeArm Pressure +----------+--------+--------+--------+------------+           94                                   +----------+--------+--------+--------+------------+ +---------+--------+--+--------+--+---------+ VertebralPSV cm/s52EDV cm/s23Antegrade +---------+--------+--+--------+--+---------+  ABI Findings: +--------+------------------+-----+---------+--------------+ Right   Rt Pressure (mmHg)IndexWaveform Comment        +--------+------------------+-----+---------+--------------+ Brachial                                Restricted arm +--------+------------------+-----+---------+--------------+ PTA     163               1.24  triphasic               +--------+------------------+-----+---------+--------------+ DP      147               1.12 triphasic               +--------+------------------+-----+---------+--------------+ +--------+------------------+-----+---------+-------+ Left    Lt Pressure (mmHg)IndexWaveform Comment +--------+------------------+-----+---------+-------+ [id number]                    triphasic        +--------+------------------+-----+---------+-------+ PTA     156               1.19 triphasic        +--------+------------------+-----+---------+-------+ DP      138               1.05 triphasic        +--------+------------------+-----+---------+-------+ +-------+---------------+----------------+ ABI/TBIToday's ABI/TBIPrevious ABI/TBI +-------+---------------+----------------+ Right  1.24                            +-------+---------------+----------------+  Left   1.19                            +-------+---------------+----------------+  Right Doppler Findings: +--------+--------+-----+-------+--------------+ Site    PressureIndexDopplerComments       +--------+--------+-----+-------+--------------+ Brachial                    Restricted arm +--------+--------+-----+-------+--------------+  Left Doppler Findings: +--------+--------+-----+---------+--------+ Site    PressureIndexDoppler  Comments +--------+--------+-----+---------+--------+ [id number]          triphasic         +--------+--------+-----+---------+--------+ Radial               triphasic         +--------+--------+-----+---------+--------+ Ulnar                triphasic         +--------+--------+-----+---------+--------+  Summary: Right Carotid: Velocities in the right ICA are consistent with a 1-39% stenosis. Left Carotid: Velocities in the left ICA are consistent with a 1-39% stenosis. Vertebrals: Bilateral vertebral arteries demonstrate antegrade flow. Right ABI:  Resting right ankle-brachial index is within normal range. No evidence of significant right lower extremity arterial disease. Left ABI: Resting left ankle-brachial index is within normal range. No evidence of significant left lower extremity arterial disease. Left Upper Extremity: Doppler waveform obliterate with left radial compression. Doppler waveforms decrease >50% with left ulnar compression.     Preliminary     50 minutes with more than 50% spent in reviewing records, counseling patient and coordinating care.     Taye T. [place]  If 7PM-7AM, please contact night-coverage [email or web address] Password [facility] 04/15/2020, 5:45 PM

## 2020-04-15 NOTE — Progress Notes (Signed)
CARDIAC REHAB PHASE I   Preop education completed with pt and wife. Pt given IS and able to demonstrate 1500 properly. Pt given cardiac surgery booklet, in-the-tube sheet and OHS care guide. Stressed importance of sternal precautions post-op as this is how pt currently adjusts himself in the bed. Wife states concerns as his last OHS involved a long recovery, and has already been away from home almost a month already. Provided support and encouragement. Will continue to follow throughout his hospital stay.  [id number] Rufina Falco, RN BSN 04/15/2020 10:22 AM

## 2020-04-16 ENCOUNTER — Inpatient Hospital Stay (HOSPITAL_COMMUNITY): Payer: BC Managed Care – PPO

## 2020-04-16 ENCOUNTER — Ambulatory Visit: Payer: BC Managed Care – PPO | Admitting: Cardiology

## 2020-04-16 ENCOUNTER — Inpatient Hospital Stay: Payer: BC Managed Care – PPO | Admitting: Internal Medicine

## 2020-04-16 ENCOUNTER — Inpatient Hospital Stay (HOSPITAL_COMMUNITY): Payer: BC Managed Care – PPO | Admitting: Certified Registered Nurse Anesthetist

## 2020-04-16 ENCOUNTER — Inpatient Hospital Stay (HOSPITAL_COMMUNITY)
Admission: AD | Disposition: A | Discharge status: Rehabilitation Facility | Payer: Self-pay | Source: Ambulatory Visit | Attending: Surgery

## 2020-04-16 ENCOUNTER — Encounter (HOSPITAL_COMMUNITY): Payer: Self-pay | Admitting: Internal Medicine

## 2020-04-16 DIAGNOSIS — J9811 Atelectasis: Secondary | ICD-10-CM | POA: Diagnosis not present

## 2020-04-16 DIAGNOSIS — T826XXA Infection and inflammatory reaction due to cardiac valve prosthesis, initial encounter: Secondary | ICD-10-CM | POA: Diagnosis not present

## 2020-04-16 DIAGNOSIS — I358 Other nonrheumatic aortic valve disorders: Secondary | ICD-10-CM | POA: Diagnosis not present

## 2020-04-16 DIAGNOSIS — I330 Acute and subacute infective endocarditis: Secondary | ICD-10-CM | POA: Diagnosis not present

## 2020-04-16 DIAGNOSIS — I712 Thoracic aortic aneurysm, without rupture: Secondary | ICD-10-CM | POA: Diagnosis not present

## 2020-04-16 DIAGNOSIS — Z01818 Encounter for other preprocedural examination: Secondary | ICD-10-CM | POA: Diagnosis not present

## 2020-04-16 DIAGNOSIS — G061 Intraspinal abscess and granuloma: Secondary | ICD-10-CM | POA: Diagnosis not present

## 2020-04-16 DIAGNOSIS — I480 Paroxysmal atrial fibrillation: Secondary | ICD-10-CM | POA: Diagnosis not present

## 2020-04-16 DIAGNOSIS — I719 Aortic aneurysm of unspecified site, without rupture: Secondary | ICD-10-CM | POA: Diagnosis not present

## 2020-04-16 DIAGNOSIS — I350 Nonrheumatic aortic (valve) stenosis: Secondary | ICD-10-CM | POA: Diagnosis not present

## 2020-04-16 DIAGNOSIS — E785 Hyperlipidemia, unspecified: Secondary | ICD-10-CM | POA: Diagnosis not present

## 2020-04-16 DIAGNOSIS — K6812 Psoas muscle abscess: Secondary | ICD-10-CM | POA: Diagnosis not present

## 2020-04-16 HISTORY — PX: TEE WITHOUT CARDIOVERSION: SHX5443

## 2020-04-16 HISTORY — PX: ASCENDING AORTIC ROOT REPLACEMENT: SHX5729

## 2020-04-16 LAB — CBC
HCT: 22.2 % — ABNORMAL LOW (ref 39.0–52.0)
Hemoglobin: 7.2 g/dL — ABNORMAL LOW (ref 13.0–17.0)
MCH: 29.4 pg (ref 26.0–34.0)
MCHC: 32.4 g/dL (ref 30.0–36.0)
MCV: 90.6 fL (ref 80.0–100.0)
Platelets: 191 10*3/uL (ref 150–400)
RBC: 2.45 MIL/uL — ABNORMAL LOW (ref 4.22–5.81)
RDW: 15.7 % — ABNORMAL HIGH (ref 11.5–15.5)
WBC: 15.8 10*3/uL — ABNORMAL HIGH (ref 4.0–10.5)
nRBC: 0 % (ref 0.0–0.2)

## 2020-04-16 LAB — GLUCOSE, CAPILLARY
Glucose-Capillary: 127 mg/dL — ABNORMAL HIGH (ref 70–99)
Glucose-Capillary: 131 mg/dL — ABNORMAL HIGH (ref 70–99)
Glucose-Capillary: 143 mg/dL — ABNORMAL HIGH (ref 70–99)
Glucose-Capillary: 146 mg/dL — ABNORMAL HIGH (ref 70–99)
Glucose-Capillary: 150 mg/dL — ABNORMAL HIGH (ref 70–99)
Glucose-Capillary: 152 mg/dL — ABNORMAL HIGH (ref 70–99)

## 2020-04-16 LAB — POCT I-STAT 7, (LYTES, BLD GAS, ICA,H+H)
Acid-Base Excess: 0 mmol/L (ref 0.0–2.0)
Acid-Base Excess: 1 mmol/L (ref 0.0–2.0)
Acid-Base Excess: 2 mmol/L (ref 0.0–2.0)
Bicarbonate: 25 mmol/L (ref 20.0–28.0)
Bicarbonate: 25.9 mmol/L (ref 20.0–28.0)
Bicarbonate: 27.1 mmol/L (ref 20.0–28.0)
Calcium, Ion: 1.13 mmol/L — ABNORMAL LOW (ref 1.15–1.40)
Calcium, Ion: 1.16 mmol/L (ref 1.15–1.40)
Calcium, Ion: 1.17 mmol/L (ref 1.15–1.40)
HCT: 19 % — ABNORMAL LOW (ref 39.0–52.0)
HCT: 20 % — ABNORMAL LOW (ref 39.0–52.0)
HCT: 24 % — ABNORMAL LOW (ref 39.0–52.0)
Hemoglobin: 6.5 g/dL — CL (ref 13.0–17.0)
Hemoglobin: 6.8 g/dL — CL (ref 13.0–17.0)
Hemoglobin: 8.2 g/dL — ABNORMAL LOW (ref 13.0–17.0)
O2 Saturation: 95 %
O2 Saturation: 96 %
O2 Saturation: 98 %
Patient temperature: 36.1
Patient temperature: 36.2
Patient temperature: 36.3
Potassium: 4.3 mmol/L (ref 3.5–5.1)
Potassium: 4.4 mmol/L (ref 3.5–5.1)
Potassium: 4.5 mmol/L (ref 3.5–5.1)
Sodium: 140 mmol/L (ref 135–145)
Sodium: 140 mmol/L (ref 135–145)
Sodium: 140 mmol/L (ref 135–145)
TCO2: 26 mmol/L (ref 22–32)
TCO2: 27 mmol/L (ref 22–32)
TCO2: 29 mmol/L (ref 22–32)
pCO2 arterial: 39.2 mmHg (ref 32.0–48.0)
pCO2 arterial: 40.5 mmHg (ref 32.0–48.0)
pCO2 arterial: 46.1 mmHg (ref 32.0–48.0)
pH, Arterial: 7.374 (ref 7.350–7.450)
pH, Arterial: 7.409 (ref 7.350–7.450)
pH, Arterial: 7.411 (ref 7.350–7.450)
pO2, Arterial: 110 mmHg — ABNORMAL HIGH (ref 83.0–108.0)
pO2, Arterial: 72 mmHg — ABNORMAL LOW (ref 83.0–108.0)
pO2, Arterial: 80 mmHg — ABNORMAL LOW (ref 83.0–108.0)

## 2020-04-16 LAB — SURGICAL PCR SCREEN
MRSA, PCR: NEGATIVE
Staphylococcus aureus: NEGATIVE

## 2020-04-16 LAB — HEMOGLOBIN AND HEMATOCRIT, BLOOD
HCT: 24.5 % — ABNORMAL LOW (ref 39.0–52.0)
Hemoglobin: 8.1 g/dL — ABNORMAL LOW (ref 13.0–17.0)

## 2020-04-16 LAB — PREPARE RBC (CROSSMATCH)

## 2020-04-16 LAB — ECHO INTRAOPERATIVE TEE
Height: 70 in
Weight: 2776.03 oz

## 2020-04-16 LAB — APTT
aPTT: 69 seconds — ABNORMAL HIGH (ref 24–36)
aPTT: 46 seconds — ABNORMAL HIGH (ref 24–36)

## 2020-04-16 LAB — PROTIME-INR
INR: 0.9 (ref 0.8–1.2)
Prothrombin Time: 11.3 seconds — ABNORMAL LOW (ref 11.4–15.2)

## 2020-04-16 LAB — FIBRINOGEN: Fibrinogen: 367 mg/dL (ref 210–475)

## 2020-04-16 LAB — PLATELET COUNT: Platelets: 125 10*3/uL — ABNORMAL LOW (ref 150–400)

## 2020-04-16 LAB — HEPARIN LEVEL (UNFRACTIONATED): Heparin Unfractionated: 0.58 IU/mL (ref 0.30–0.70)

## 2020-04-16 SURGERY — REDO STERNOTOMY
Anesthesia: General | Site: Chest

## 2020-04-16 MED ORDER — PHENYLEPHRINE 40 MCG/ML (10ML) SYRINGE FOR IV PUSH (FOR BLOOD PRESSURE SUPPORT)
PREFILLED_SYRINGE | INTRAVENOUS | Status: AC
  Filled 2020-04-16: qty 10

## 2020-04-16 MED ORDER — DEXMEDETOMIDINE HCL IN NACL 200 MCG/50ML IV SOLN
INTRAVENOUS | Status: AC
  Filled 2020-04-16: qty 100

## 2020-04-16 MED ORDER — ONDANSETRON HCL 4 MG/2ML IJ SOLN
INTRAMUSCULAR | Status: AC
  Filled 2020-04-16: qty 2

## 2020-04-16 MED ORDER — PROTAMINE SULFATE 10 MG/ML IV SOLN
INTRAVENOUS | Status: DC | PRN
  Administered 2020-04-16: 250 mg via INTRAVENOUS

## 2020-04-16 MED ORDER — SODIUM CHLORIDE 0.9% IV SOLUTION: Freq: Once | INTRAVENOUS | Status: DC

## 2020-04-16 MED ORDER — BISACODYL 5 MG PO TBEC
10.0000 mg | DELAYED_RELEASE_TABLET | Freq: Every day | ORAL | Status: DC
  Administered 2020-04-17 – 2020-04-30 (×10): 10 mg via ORAL
  Filled 2020-04-16 (×11): qty 2

## 2020-04-16 MED ORDER — PHENYLEPHRINE HCL (PRESSORS) 10 MG/ML IV SOLN
INTRAVENOUS | Status: DC | PRN
  Administered 2020-04-16 (×2): 120 ug via INTRAVENOUS
  Administered 2020-04-16: 80 ug via INTRAVENOUS
  Administered 2020-04-16 (×2): 120 ug via INTRAVENOUS

## 2020-04-16 MED ORDER — LACTATED RINGERS IV SOLN: INTRAVENOUS | Status: DC

## 2020-04-16 MED ORDER — ASPIRIN 81 MG PO CHEW
324.0000 mg | CHEWABLE_TABLET | Freq: Every day | ORAL | Status: DC
  Filled 2020-04-16 (×4): qty 4

## 2020-04-16 MED ORDER — ALBUMIN HUMAN 5 % IV SOLN
250.0000 mL | INTRAVENOUS | Status: DC | PRN
  Administered 2020-04-16: 12.5 g via INTRAVENOUS

## 2020-04-16 MED ORDER — METOPROLOL TARTRATE 25 MG/10 ML ORAL SUSPENSION
12.5000 mg | Freq: Two times a day (BID) | ORAL | Status: DC
  Administered 2020-04-16: 12.5 mg
  Filled 2020-04-16: qty 5

## 2020-04-16 MED ORDER — NOREPINEPHRINE 4 MG/250ML-% IV SOLN: 0.0000 ug/min | INTRAVENOUS | Status: DC

## 2020-04-16 MED ORDER — ACETAMINOPHEN 160 MG/5ML PO SOLN: 650.0000 mg | Freq: Once | ORAL | Status: AC

## 2020-04-16 MED ORDER — FAMOTIDINE IN NACL 20-0.9 MG/50ML-% IV SOLN
20.0000 mg | Freq: Two times a day (BID) | INTRAVENOUS | Status: AC
  Administered 2020-04-16: 20 mg via INTRAVENOUS
  Filled 2020-04-16: qty 50

## 2020-04-16 MED ORDER — GLYCOPYRROLATE 0.2 MG/ML IJ SOLN
INTRAMUSCULAR | Status: DC | PRN
Start: 2020-04-16 — End: 2020-04-16
  Administered 2020-04-16 (×2): .1 mg via INTRAVENOUS

## 2020-04-16 MED ORDER — LACTATED RINGERS IV SOLN: INTRAVENOUS | Status: DC | PRN

## 2020-04-16 MED ORDER — HEPARIN SODIUM (PORCINE) 1000 UNIT/ML IJ SOLN
INTRAMUSCULAR | Status: AC
  Filled 2020-04-16: qty 1

## 2020-04-16 MED ORDER — ALBUMIN HUMAN 5 % IV SOLN: INTRAVENOUS | Status: DC | PRN

## 2020-04-16 MED ORDER — THROMBIN 20000 UNITS EX SOLR
CUTANEOUS | Status: DC | PRN
  Administered 2020-04-16: 20000 [IU] via TOPICAL

## 2020-04-16 MED ORDER — DEXTROSE 50 % IV SOLN: 0.0000 mL | INTRAVENOUS | Status: DC | PRN

## 2020-04-16 MED ORDER — PROPOFOL 10 MG/ML IV BOLUS
INTRAVENOUS | Status: DC | PRN
  Administered 2020-04-16: 50 mg via INTRAVENOUS

## 2020-04-16 MED ORDER — THROMBIN (RECOMBINANT) 20000 UNITS EX SOLR
CUTANEOUS | Status: AC
  Filled 2020-04-16: qty 20000

## 2020-04-16 MED ORDER — SODIUM CHLORIDE 0.45 % IV SOLN: INTRAVENOUS | Status: DC | PRN

## 2020-04-16 MED ORDER — SODIUM CHLORIDE 0.9% FLUSH: 3.0000 mL | INTRAVENOUS | Status: DC | PRN

## 2020-04-16 MED ORDER — MORPHINE SULFATE (PF) 2 MG/ML IV SOLN
1.0000 mg | INTRAVENOUS | Status: DC | PRN
  Administered 2020-04-17: 4 mg via INTRAVENOUS
  Administered 2020-04-17: 2 mg via INTRAVENOUS
  Administered 2020-04-17 (×2): 4 mg via INTRAVENOUS
  Administered 2020-04-17: 2 mg via INTRAVENOUS
  Administered 2020-04-18: 4 mg via INTRAVENOUS
  Filled 2020-04-16: qty 1
  Filled 2020-04-16: qty 2
  Filled 2020-04-16: qty 1
  Filled 2020-04-16 (×3): qty 2

## 2020-04-16 MED ORDER — PANTOPRAZOLE SODIUM 40 MG PO TBEC
40.0000 mg | DELAYED_RELEASE_TABLET | Freq: Every day | ORAL | Status: DC
  Administered 2020-04-18 – 2020-04-30 (×13): 40 mg via ORAL
  Filled 2020-04-16 (×13): qty 1

## 2020-04-16 MED ORDER — NITROGLYCERIN IN D5W 200-5 MCG/ML-% IV SOLN: 0.0000 ug/min | INTRAVENOUS | Status: DC

## 2020-04-16 MED ORDER — DOCUSATE SODIUM 100 MG PO CAPS
200.0000 mg | ORAL_CAPSULE | Freq: Every day | ORAL | Status: DC
  Administered 2020-04-17 – 2020-04-30 (×12): 200 mg via ORAL
  Filled 2020-04-16 (×13): qty 2

## 2020-04-16 MED ORDER — HEPARIN SODIUM (PORCINE) 1000 UNIT/ML IJ SOLN
INTRAMUSCULAR | Status: DC | PRN
  Administered 2020-04-16: 5000 [IU] via INTRAVENOUS
  Administered 2020-04-16: 25000 [IU] via INTRAVENOUS

## 2020-04-16 MED ORDER — DOPAMINE-DEXTROSE 3.2-5 MG/ML-% IV SOLN
INTRAVENOUS | Status: DC | PRN
  Administered 2020-04-16: 10 ug/kg/min via INTRAVENOUS

## 2020-04-16 MED ORDER — SODIUM CHLORIDE 0.9 % IR SOLN
Status: DC | PRN
  Administered 2020-04-16: 1

## 2020-04-16 MED ORDER — EPHEDRINE SULFATE 50 MG/ML IJ SOLN
INTRAMUSCULAR | Status: DC | PRN
  Administered 2020-04-16 (×2): 5 mg via INTRAVENOUS
  Administered 2020-04-16: 10 mg via INTRAVENOUS

## 2020-04-16 MED ORDER — COAGULATION FACTOR VIIA RECOMB 1 MG IV SOLR
45.0000 ug/kg | INTRAVENOUS | Status: AC
  Administered 2020-04-16: 4000 ug via INTRAVENOUS
  Filled 2020-04-16: qty 4

## 2020-04-16 MED ORDER — SODIUM CHLORIDE 0.9 % IV SOLN: 250.0000 mL | INTRAVENOUS | Status: DC

## 2020-04-16 MED ORDER — 0.9 % SODIUM CHLORIDE (POUR BTL) OPTIME
TOPICAL | Status: DC | PRN
  Administered 2020-04-16: 5000 mL
  Administered 2020-04-16: 1000 mL
  Administered 2020-04-16: 2000 mL

## 2020-04-16 MED ORDER — ROCURONIUM BROMIDE 10 MG/ML (PF) SYRINGE
PREFILLED_SYRINGE | INTRAVENOUS | Status: AC
  Filled 2020-04-16: qty 20

## 2020-04-16 MED ORDER — ORAL CARE MOUTH RINSE: 15.0000 mL | OROMUCOSAL | Status: DC

## 2020-04-16 MED ORDER — FENTANYL CITRATE (PF) 250 MCG/5ML IJ SOLN
INTRAMUSCULAR | Status: AC
  Filled 2020-04-16: qty 20

## 2020-04-16 MED ORDER — MIDAZOLAM HCL (PF) 10 MG/2ML IJ SOLN
INTRAMUSCULAR | Status: AC
  Filled 2020-04-16: qty 2

## 2020-04-16 MED ORDER — PROPOFOL 10 MG/ML IV BOLUS
INTRAVENOUS | Status: AC
  Filled 2020-04-16: qty 20

## 2020-04-16 MED ORDER — METOPROLOL TARTRATE 5 MG/5ML IV SOLN: 2.5000 mg | INTRAVENOUS | Status: DC | PRN

## 2020-04-16 MED ORDER — METOPROLOL TARTRATE 12.5 MG HALF TABLET: 12.5000 mg | ORAL_TABLET | Freq: Two times a day (BID) | ORAL | Status: DC

## 2020-04-16 MED ORDER — PLASMA-LYTE 148 IV SOLN
INTRAVENOUS | Status: DC | PRN
  Administered 2020-04-16: 500 mL

## 2020-04-16 MED ORDER — TRAMADOL HCL 50 MG PO TABS
50.0000 mg | ORAL_TABLET | ORAL | Status: DC | PRN
  Administered 2020-04-17 – 2020-04-28 (×7): 50 mg via ORAL
  Filled 2020-04-16 (×8): qty 1

## 2020-04-16 MED ORDER — PROTAMINE SULFATE 10 MG/ML IV SOLN
INTRAVENOUS | Status: AC
  Filled 2020-04-16: qty 25

## 2020-04-16 MED ORDER — HEMOSTATIC AGENTS (NO CHARGE) OPTIME
TOPICAL | Status: DC | PRN
  Administered 2020-04-16 (×5): 1 via TOPICAL

## 2020-04-16 MED ORDER — ACETAMINOPHEN 500 MG PO TABS
1000.0000 mg | ORAL_TABLET | Freq: Four times a day (QID) | ORAL | Status: AC
  Administered 2020-04-17 – 2020-04-21 (×17): 1000 mg via ORAL
  Filled 2020-04-16 (×19): qty 2

## 2020-04-16 MED ORDER — DEXMEDETOMIDINE HCL IN NACL 400 MCG/100ML IV SOLN: 0.0000 ug/kg/h | INTRAVENOUS | Status: DC

## 2020-04-16 MED ORDER — BISACODYL 10 MG RE SUPP
10.0000 mg | Freq: Every day | RECTAL | Status: DC
  Administered 2020-04-20 – 2020-04-22 (×2): 10 mg via RECTAL
  Filled 2020-04-16 (×3): qty 1

## 2020-04-16 MED ORDER — MIDAZOLAM HCL 2 MG/2ML IJ SOLN: 2.0000 mg | INTRAMUSCULAR | Status: DC | PRN

## 2020-04-16 MED ORDER — EPHEDRINE 5 MG/ML INJ
INTRAVENOUS | Status: AC
  Filled 2020-04-16: qty 20

## 2020-04-16 MED ORDER — FENTANYL CITRATE (PF) 250 MCG/5ML IJ SOLN
INTRAMUSCULAR | Status: AC
  Filled 2020-04-16: qty 5

## 2020-04-16 MED ORDER — ROCURONIUM BROMIDE 10 MG/ML (PF) SYRINGE
PREFILLED_SYRINGE | INTRAVENOUS | Status: DC | PRN
  Administered 2020-04-16: 50 mg via INTRAVENOUS
  Administered 2020-04-16: 100 mg via INTRAVENOUS
  Administered 2020-04-16 (×2): 50 mg via INTRAVENOUS
  Administered 2020-04-16: 20 mg via INTRAVENOUS

## 2020-04-16 MED ORDER — ASPIRIN EC 325 MG PO TBEC
325.0000 mg | DELAYED_RELEASE_TABLET | Freq: Every day | ORAL | Status: DC
  Administered 2020-04-17 – 2020-04-27 (×11): 325 mg via ORAL
  Filled 2020-04-16 (×11): qty 1

## 2020-04-16 MED ORDER — LACTATED RINGERS IV SOLN
INTRAVENOUS | Status: DC | PRN
Start: 2020-04-16 — End: 2020-04-16

## 2020-04-16 MED ORDER — ACETAMINOPHEN 160 MG/5ML PO SOLN
1000.0000 mg | Freq: Four times a day (QID) | ORAL | Status: AC
  Administered 2020-04-16: 1000 mg
  Filled 2020-04-16: qty 40.6

## 2020-04-16 MED ORDER — FENTANYL CITRATE (PF) 250 MCG/5ML IJ SOLN
INTRAMUSCULAR | Status: DC | PRN
  Administered 2020-04-16: 250 ug via INTRAVENOUS
  Administered 2020-04-16: 200 ug via INTRAVENOUS
  Administered 2020-04-16: 750 ug via INTRAVENOUS
  Administered 2020-04-16: 50 ug via INTRAVENOUS

## 2020-04-16 MED ORDER — CHLORHEXIDINE GLUCONATE 0.12% ORAL RINSE (MEDLINE KIT)
15.0000 mL | Freq: Two times a day (BID) | OROMUCOSAL | Status: DC
  Administered 2020-04-17 – 2020-04-23 (×9): 15 mL via OROMUCOSAL

## 2020-04-16 MED ORDER — SODIUM CHLORIDE 0.9% IV SOLUTION: Freq: Once | INTRAVENOUS | Status: AC

## 2020-04-16 MED ORDER — ROCURONIUM BROMIDE 10 MG/ML (PF) SYRINGE
PREFILLED_SYRINGE | INTRAVENOUS | Status: AC
  Filled 2020-04-16: qty 10

## 2020-04-16 MED ORDER — SODIUM CHLORIDE 0.9% FLUSH
3.0000 mL | Freq: Two times a day (BID) | INTRAVENOUS | Status: DC
  Administered 2020-04-17 – 2020-04-22 (×8): 3 mL via INTRAVENOUS

## 2020-04-16 MED ORDER — ACETAMINOPHEN 650 MG RE SUPP
650.0000 mg | Freq: Once | RECTAL | Status: AC
  Administered 2020-04-16: 650 mg via RECTAL

## 2020-04-16 MED ORDER — ONDANSETRON HCL 4 MG/2ML IJ SOLN
4.0000 mg | Freq: Four times a day (QID) | INTRAMUSCULAR | Status: DC | PRN
  Administered 2020-04-18: 4 mg via INTRAVENOUS
  Filled 2020-04-16: qty 2

## 2020-04-16 MED ORDER — PROTAMINE SULFATE 10 MG/ML IV SOLN
INTRAVENOUS | Status: AC
  Filled 2020-04-16: qty 5

## 2020-04-16 MED ORDER — PHENYLEPHRINE HCL-NACL 20-0.9 MG/250ML-% IV SOLN
0.0000 ug/min | INTRAVENOUS | Status: DC
  Administered 2020-04-17: 15 ug/min via INTRAVENOUS
  Filled 2020-04-16: qty 250

## 2020-04-16 MED ORDER — EPHEDRINE 5 MG/ML INJ
INTRAVENOUS | Status: AC
  Filled 2020-04-16: qty 10

## 2020-04-16 MED ORDER — MAGNESIUM SULFATE 4 GM/100ML IV SOLN
4.0000 g | Freq: Once | INTRAVENOUS | Status: AC
  Administered 2020-04-16: 4 g via INTRAVENOUS
  Filled 2020-04-16: qty 100

## 2020-04-16 MED ORDER — LACTATED RINGERS IV SOLN: 500.0000 mL | Freq: Once | INTRAVENOUS | Status: DC | PRN

## 2020-04-16 MED ORDER — CHLORHEXIDINE GLUCONATE 0.12 % MT SOLN
15.0000 mL | OROMUCOSAL | Status: AC
  Administered 2020-04-16: 15 mL via OROMUCOSAL

## 2020-04-16 MED ORDER — SODIUM CHLORIDE 0.9 % IV SOLN: INTRAVENOUS | Status: DC | PRN

## 2020-04-16 MED ORDER — PHENYLEPHRINE 40 MCG/ML (10ML) SYRINGE FOR IV PUSH (FOR BLOOD PRESSURE SUPPORT)
PREFILLED_SYRINGE | INTRAVENOUS | Status: AC
  Filled 2020-04-16: qty 20

## 2020-04-16 MED ORDER — MILRINONE LACTATE IN DEXTROSE 20-5 MG/100ML-% IV SOLN: 0.2000 ug/kg/min | INTRAVENOUS | Status: DC

## 2020-04-16 MED ORDER — MIDAZOLAM HCL 5 MG/5ML IJ SOLN
INTRAMUSCULAR | Status: DC | PRN
  Administered 2020-04-16 (×4): 2 mg via INTRAVENOUS

## 2020-04-16 MED ORDER — POTASSIUM CHLORIDE 10 MEQ/50ML IV SOLN: 10.0000 meq | INTRAVENOUS | Status: AC

## 2020-04-16 MED ORDER — INSULIN REGULAR(HUMAN) IN NACL 100-0.9 UT/100ML-% IV SOLN: INTRAVENOUS | Status: DC

## 2020-04-16 MED ORDER — SODIUM CHLORIDE 0.9 % IV SOLN: INTRAVENOUS | Status: DC

## 2020-04-16 SURGICAL SUPPLY — 117 items
ADAPTER CARDIO PERF ANTE/RETRO (ADAPTER) ×3 IMPLANT
APPLICATOR TIP COSEAL (VASCULAR PRODUCTS) IMPLANT
ATTRACTOMAT 16X20 MAGNETIC DRP (DRAPES) IMPLANT
BAG DECANTER FOR FLEXI CONT (MISCELLANEOUS) ×9 IMPLANT
BLADE CLIPPER SURG (BLADE) IMPLANT
BLADE CORE FAN STRYKER (BLADE) ×3 IMPLANT
BLADE STERNUM SYSTEM 6 (BLADE) IMPLANT
BLADE SURG 15 STRL LF DISP TIS (BLADE) ×4 IMPLANT
BLADE SURG 15 STRL SS (BLADE) ×2
CANISTER SUCT 3000ML PPV (MISCELLANEOUS) ×3 IMPLANT
CANNULA AORTIC ROOT 9FR (CANNULA) ×3 IMPLANT
CANNULA ARTERIAL VENT 3/8 20FR (CANNULA) IMPLANT
CANNULA GUNDRY RCSP 15FR (MISCELLANEOUS) ×3 IMPLANT
CANNULA MC2 2 STG 36/46 NON-V (CANNULA) ×2 IMPLANT
CANNULA SUMP PERICARDIAL (CANNULA) ×3 IMPLANT
CANNULA VENOUS 2 STG 34/46 (CANNULA) ×1
CATH HEART VENT LEFT (CATHETERS) ×2 IMPLANT
CATH ROBINSON RED A/P 18FR (CATHETERS) ×9 IMPLANT
CATH THORACIC 36FR (CATHETERS) IMPLANT
CATH THORACIC 36FR RT ANG (CATHETERS) IMPLANT
CAUTERY SURG HI TEMP FINE TIP (MISCELLANEOUS) ×3 IMPLANT
CLIP FOGARTY SPRING 6M (CLIP) ×3 IMPLANT
CNTNR URN SCR LID CUP LEK RST (MISCELLANEOUS) ×6 IMPLANT
CONT SPEC 4OZ STRL OR WHT (MISCELLANEOUS) ×3
DRAPE CARDIOVASCULAR INCISE (DRAPES) ×1
DRAPE HALF SHEET 40X57 (DRAPES) ×3 IMPLANT
DRAPE SLUSH/WARMER DISC (DRAPES) ×3 IMPLANT
DRAPE SRG 135X102X78XABS (DRAPES) ×2 IMPLANT
DRSG COVADERM 4X14 (GAUZE/BANDAGES/DRESSINGS) ×3 IMPLANT
DRSG COVADERM 4X8 (GAUZE/BANDAGES/DRESSINGS) ×3 IMPLANT
ELECT BLADE 4.0 EZ CLEAN MEGAD (MISCELLANEOUS) ×3
ELECT BLADE 6.5 EXT (BLADE) ×3 IMPLANT
ELECT CAUTERY BLADE 6.4 (BLADE) ×3 IMPLANT
ELECT REM PT RETURN 9FT ADLT (ELECTROSURGICAL) ×6
ELECTRODE BLDE 4.0 EZ CLN MEGD (MISCELLANEOUS) ×2 IMPLANT
ELECTRODE REM PT RTRN 9FT ADLT (ELECTROSURGICAL) ×4 IMPLANT
FELT TEFLON 1X6 (MISCELLANEOUS) ×6 IMPLANT
GAUZE SPONGE 4X4 12PLY STRL (GAUZE/BANDAGES/DRESSINGS) ×6 IMPLANT
GLOVE BIO SURGEON STRL SZ 6 (GLOVE) ×6 IMPLANT
GLOVE BIO SURGEON STRL SZ 6.5 (GLOVE) ×6 IMPLANT
GLOVE BIO SURGEON STRL SZ7 (GLOVE) IMPLANT
GLOVE BIO SURGEON STRL SZ7.5 (GLOVE) IMPLANT
GLOVE BIOGEL PI IND STRL 6 (GLOVE) ×14 IMPLANT
GLOVE BIOGEL PI IND STRL 6.5 (GLOVE) ×2 IMPLANT
GLOVE BIOGEL PI IND STRL 7.5 (GLOVE) ×6 IMPLANT
GLOVE BIOGEL PI INDICATOR 6 (GLOVE) ×7
GLOVE BIOGEL PI INDICATOR 6.5 (GLOVE) ×1
GLOVE BIOGEL PI INDICATOR 7.5 (GLOVE) ×3
GLOVE ECLIPSE 6.5 STRL STRAW (GLOVE) ×9 IMPLANT
GLOVE SS BIOGEL STRL SZ 7 (GLOVE) IMPLANT
GLOVE SUPERSENSE BIOGEL SZ 7 (GLOVE)
GLOVE SURG SS PI 7.0 STRL IVOR (GLOVE) ×3 IMPLANT
GLOVE TRIUMPH SURG SIZE 7.0 (KITS) ×6 IMPLANT
GOWN STRL REUS W/ TWL LRG LVL3 (GOWN DISPOSABLE) ×24 IMPLANT
GOWN STRL REUS W/ TWL XL LVL3 (GOWN DISPOSABLE) ×2 IMPLANT
GOWN STRL REUS W/TWL LRG LVL3 (GOWN DISPOSABLE) ×12
GOWN STRL REUS W/TWL XL LVL3 (GOWN DISPOSABLE) ×1
GRAFT CV 30X8WVN NDL (Graft) ×2 IMPLANT
GRAFT HEMASHIELD 8MM (Graft) ×1 IMPLANT
HEMOSTAT POWDER SURGIFOAM 1G (HEMOSTASIS) ×9 IMPLANT
HEMOSTAT SURGICEL 2X14 (HEMOSTASIS) ×3 IMPLANT
KIT BASIN OR (CUSTOM PROCEDURE TRAY) ×3 IMPLANT
KIT CATH CPB BARTLE (MISCELLANEOUS) IMPLANT
KIT SUCTION CATH 14FR (SUCTIONS) ×3 IMPLANT
KIT TURNOVER KIT B (KITS) ×3 IMPLANT
LINE VENT (MISCELLANEOUS) ×3 IMPLANT
NS IRRIG 1000ML POUR BTL (IV SOLUTION) ×27 IMPLANT
PACK E OPEN HEART (SUTURE) ×3 IMPLANT
PACK OPEN HEART (CUSTOM PROCEDURE TRAY) ×3 IMPLANT
PAD ARMBOARD 7.5X6 YLW CONV (MISCELLANEOUS) ×6 IMPLANT
PENCIL BUTTON HOLSTER BLD 10FT (ELECTRODE) ×6 IMPLANT
POSITIONER HEAD DONUT 9IN (MISCELLANEOUS) ×3 IMPLANT
PUNCH AORTIC ROTATE 5MM 8IN (MISCELLANEOUS) ×3 IMPLANT
SEALANT SURG COSEAL 8ML (VASCULAR PRODUCTS) ×3 IMPLANT
SET CARDIOPLEGIA MPS 5001102 (MISCELLANEOUS) ×3 IMPLANT
SPONGE LAP 18X18 RF (DISPOSABLE) ×6 IMPLANT
SPONGE LAP 4X18 RFD (DISPOSABLE) ×3 IMPLANT
SUT BONE WAX W31G (SUTURE) ×3 IMPLANT
SUT ETHIBON 2 0 V 52N 30 (SUTURE) IMPLANT
SUT ETHIBON EXCEL 2-0 V-5 (SUTURE) IMPLANT
SUT ETHIBOND 4 0 RB 1 (SUTURE) ×12 IMPLANT
SUT ETHIBOND V-5 VALVE (SUTURE) IMPLANT
SUT PROLENE 3 0 SH 1 (SUTURE) ×3 IMPLANT
SUT PROLENE 3 0 SH 48 (SUTURE) ×6 IMPLANT
SUT PROLENE 3 0 SH DA (SUTURE) ×3 IMPLANT
SUT PROLENE 4 0 RB 1 (SUTURE) ×4
SUT PROLENE 4 0 SH DA (SUTURE) ×6 IMPLANT
SUT PROLENE 4-0 RB1 .5 CRCL 36 (SUTURE) ×8 IMPLANT
SUT PROLENE 5 0 C 1 36 (SUTURE) ×9 IMPLANT
SUT PROLENE 5 0 RB 2 (SUTURE) IMPLANT
SUT SILK 1 TIES 10X30 (SUTURE) ×3 IMPLANT
SUT SILK 3 0 (SUTURE) ×2
SUT SILK 3-0 18XBRD TIE 12 (SUTURE) ×4 IMPLANT
SUT STEEL 6MS V (SUTURE) IMPLANT
SUT STEEL STERNAL CCS#1 18IN (SUTURE) IMPLANT
SUT STEEL SZ 6 DBL 3X14 BALL (SUTURE) IMPLANT
SUT VIC AB 1 CTX 36 (SUTURE) ×2
SUT VIC AB 1 CTX36XBRD ANBCTR (SUTURE) ×4 IMPLANT
SUT VIC AB 2-0 CT1 27 (SUTURE) ×1
SUT VIC AB 2-0 CT1 TAPERPNT 27 (SUTURE) ×2 IMPLANT
SUT VIC AB 3-0 SH 27 (SUTURE) ×1
SUT VIC AB 3-0 SH 27X BRD (SUTURE) ×2 IMPLANT
SUT VIC AB 3-0 X1 27 (SUTURE) ×3 IMPLANT
SYSTEM SAHARA CHEST DRAIN ATS (WOUND CARE) ×3 IMPLANT
TAPE CLOTH SURG 4X10 WHT LF (GAUZE/BANDAGES/DRESSINGS) ×3 IMPLANT
TAPE PAPER 2X10 WHT MICROPORE (GAUZE/BANDAGES/DRESSINGS) ×3 IMPLANT
TOWEL GREEN STERILE (TOWEL DISPOSABLE) ×3 IMPLANT
TOWEL GREEN STERILE FF (TOWEL DISPOSABLE) ×3 IMPLANT
TRAY FOLEY SLVR 16FR TEMP STAT (SET/KITS/TRAYS/PACK) ×3 IMPLANT
TUBE CONNECTING 12X1/4 (SUCTIONS) ×3 IMPLANT
TUBE CONNECTING 20X1/4 (TUBING) ×3 IMPLANT
TUBE SUCT INTRACARD DLP 20F (MISCELLANEOUS) ×3 IMPLANT
UNDERPAD 30X36 HEAVY ABSORB (UNDERPADS AND DIAPERS) ×3 IMPLANT
VALVE AORTIC CRYO 23MM (Valve) ×3 IMPLANT
VENT LEFT HEART 12002 (CATHETERS) ×3
WATER STERILE IRR 1000ML POUR (IV SOLUTION) ×6 IMPLANT
YANKAUER SUCT BULB TIP NO VENT (SUCTIONS) ×6 IMPLANT

## 2020-04-16 NOTE — Progress Notes (Signed)
  Echocardiogram Echocardiogram Transesophageal has been performed.  Michiel Cowboy 04/16/2020, 9:28 AM

## 2020-04-16 NOTE — Op Note (Signed)
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   Postoperative Diagnosis:  Same   Procedure:  1. Redo median Sternotomy 2. Right axillary artery graft for cardiopulmonary bypass 3. Extracorporeal circulation 4. Debridement of aortic root abscess and pseudoaneurysm 5.   Aortic root replacement using a 23 mm Homograft aortic root with reimplantation of coronary arteries.  Anesthesia:  General Endotracheal  Anesthesiologist: Lillia Abed, MD  Clinical History/Surgical Indication:  This 69 year old gentleman underwent aortic valve replacement with a 23 mm Edwards pericardial valve and coronary bypass graft surgery x3 in September 2017 at [facility] for stage C asymptomatic severe aortic stenosis.  He had a catheterization here in June 2019 which documented occlusion of both vein grafts at that time with a patent left internal mammary graft to the LAD.  He had a nuclear stress test on in February 2021 in anticipation of spine surgery and this was abnormal showing anterior lateral ischemia.  He subsequently underwent cardiac catheterization on 01/21/2020 which was essentially unchanged from his prior catheterization 2019 with known occlusion of both vein grafts to the diagonal and obtuse marginal and a patent left internal mammary graft to the LAD.  The right coronary was a dominant vessel without significant disease.  The left circumflex had mild to moderate proximal to mid vessel stenosis of 50% at most.  He subsequently underwent C3-4 anterior cervical discectomy by [name] on 03/02/2020.  He reports an uneventful recovery but then developed progressive weakness, fever, malaise, and was admitted with sepsis and acute renal failure with a creatinine of 6.5 compared to his normal baseline of  1.5-1.6.  He was found to have enterococcal bacteremia and blood cultures cleared on 03/23/2020.  He had a TEE performed on 03/26/2020 which was negative.  He subsequently developed swelling in both knees and was found to have septic arthritis requiring bilateral knee washouts.  He was discharged to inpatient rehab on ampicillin but continued to have some fevers.  He began complaining of worsening back pain underwent MRI of the cervical and lumbar spine on 513 and was found to have L4-5 discitis/osteomyelitis with associated soft tissue infection within the psoas musculature bilaterally with small abscesses present.  There is edema throughout the posterior paraspinous musculature felt to possibly be myositis.  He had previous bilateral L4-5 decompressive laminotomies and foraminotomies in July 2020 by [name].  He underwent a repeat TEE today which showed a new small vegetation measuring 0.8 x 0.5 cm on the noncoronary cusp of the prosthetic valve.  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  The color flow appeared to be in and out of the pseudoaneurysm.  There are no signs of infection on the tricuspid or mitral valve.  Left and right heart function was normal.  He has been hemodynamically stable.  He had a fever to 101.9 last night.  He is afebrile at this time.  White blood cell count this morning was 7.9.  Hemoglobin was 7.5.  Creatinine was 1.43.  Electrocardiogram shows sinus rhythm with no evidence of heart block.  He has been on Eliquis for atrial fibrillation and that was stopped. 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 With development of prosthetic valve vegetation and perivalvular abscess with pseudoaneurym I felt that he required redo sternotomy for aortic root replacement using a  homograft. I discussed the operative procedure with the patient and his wife including alternatives, benefits and risks; including but not limited to bleeding, blood transfusion, infection, stroke, myocardial infarction, graft failure, heart block requiring a permanent pacemaker, organ dysfunction, and death. I also discussed the possibility that his homograft valve may become infected although the chance of that is much lower than with a bioprosthesis or mechanical valve.  Johnathan Murray understands and agrees to proceed.     Pre-bypass TEE:  Prosthetic aortic valve vegetation without stenosis or regurgitation. There is a fistula from the LVOT just below the valve into a pseudoaneurysm adjacent to the RV outflow tract. Good LV systolic function. No sign of mitral or tricuspid valve vegetation.   Preparation:  The patient was seen in the preoperative holding area and the correct patient, correct operation were confirmed with the patient after reviewing the medical record and catheterization. The consent was signed by me. Preoperative antibiotics were given. A pulmonary arterial line and radial arterial line were placed by the anesthesia team. The patient was taken back to the operating room and positioned supine on the operating room table. After being placed under general endotracheal anesthesia by the anesthesia team a foley catheter was placed. The neck, chest, abdomen, and both legs were prepped with betadine soap and solution and draped in the usual sterile manner. A surgical time-out was taken and the correct patient and operative procedure were confirmed with the nursing and anesthesia staff.  Right axillary artery exposure and cannulation:  A transverse incision was made below the right clavicle. The pectoralis major muscle was split along its fibers and the pectoralis minor muscle was retracted laterally. The brachial plexus was identified and gently retracted laterally to expose the  axillary artery. The artery was controlled proximally and distally with vessel loops. The patient was fully heparinized and ACT maintained greater than 400. The axillary artery was clamped proximally and distally with peripheral Debakey clamps. It was opened longitudinally. There was no sign of dissection here. An 8 mm Hemashield dacron graft was anastomosed in an end to side manner using continuous 5-0 prolene suture. CoSeal was applied for hemostasis and the clamp removed. The graft was connected to the arterial end of the bypass circuit.    Cardiopulmonary Bypass:  A redo median sternotomy was performed using the oscillating saw without difficulty. The sternal edges were retracted with bone hooks and the heart was dissected from the sternum using electrocautery. Exposure was more difficult due to a pectus excavatum deformity so I used the IMA sternal retractor which worked well to elevate the sternal edges. The sternal retractor was placed and dissection performed to expose the right atrium. The right atrium was cannulated using a large two stage venous cannula.  After instituting CPB the ascending aorta was exposed by dividing the adhesions without difficulty.  The ascending aorta was of normal size and had no palpable plaque. A left ventricular vent was placed via the right superior pulmonary vein and a retrograde cardioplegia cannula was placed via the right atrium and advanced into the coronary sinus.  Aortic occlusion was performed with a single Pond-clamp. Systemic cooling to 32 degrees Centigrade and topical cooling of the heart with iced saline were used. Hyperkalemic antegrade and retrograde cold blood cardioplegia was used to induce diastolic arrest and  was then retrograde was given at about 20 minute intervals throughout the period of arrest to maintain myocardial temperature at or below 10 degrees centigrade. A temperature probe was inserted into the interventricular septum and an insulating  pad was placed in the pericardium. CO2 was insufflated into the pericardium throughout the case to minimize intracardiac air.   Aortic root replacement:   The ascending aorta was transected above the STJ. Examination of the aortic root showed that the previous bioprosthetic valve had multiple small vegetations on the surface and had dehisced from the annulus over approximately 1/2 cm along the right coronary sinus. There was a fistula from this area into a pseudoaneurysm adjacent to the RV outflow tract measuring about 3 cm in width.  The right and left coronary arteries were removed from the aortic root with a button of aortic wall around the ostia. They were retracted carefully out of the way with stay sutures to prevent rotation. The old bioprosthetic valve was removed without difficulty. The area of the aortic root abscess and fistula was debrided to viable tissue. The annulus was essentially gone after removal of the valve and debridement but there was a tough rim of LVOT. I sized the LVOT and a 23 mm Homograft valve/root was prepared (ID [id number]). A series of 4-0 Ethibond simple sutures were placed around the LVOT keeping the sutures in a single plane.  The mitral leaflet was excised along with the excess septal muscle. The sutures were placed through the base of the homograft root taking care not to injure the valve leaflets. The homograft was lowered into place in the LVOT. The sutures were tied sequentially. The homograft left coronary stump was excised and the patient's left coronary button was anastomosed to it in an end to end manner using continuous 5-0 prolene suture. The homograft right coronary stump was excised and the right coronary button was anastomosed in the same manner. The homograft aorta was cut to the appropriate length and anastomosed to the patient's ascending aorta in end to end manner using continuous 4-0 prolene suture.  A vent cannula was placed into the homograft aorta to  remove any air. Deairing maneuvers were performed and the bed placed in trendelenburg position.   Completion:  The patient was rewarmed to 37 degrees Centigrade. The crossclamp was removed with a time of 281 minutes. There was spontaneous return of ventricular fibrillation and the patient spontaneously converted to asystole. The vascular anastomoses all appeared hemostatic. Two temporary epicardial pacing wires were placed on the right atrium and two on the right ventricle. He was AV paced at 80. The patient was weaned from CPB without difficulty on dopamine and milrinone.  CPB time was 351 minutes. Cardiac output was 5 LPM. TEE showed normal homograft aortic valve function with mild central AI. There was mild MR and  LV and RV function was normal. Heparin was fully reversed with protamine and the axillary arterial graft was ligated with a 0-silk tie, divided and the stump suture ligated with pledgetted 3-0 Prolene.  The patient was coagulopathic and was given cryoprecipitate, FFP and platelets to correct laboratory abnormalities.  He remained coagulopathic after the above blood products were given and therefore was given 4000 mcg of NovoSeven. Hemostasis was achieved. Mediastinal  drainage tubes were placed. The sternum was closed with double #6 stainless steel wires. The fascia was closed with continuous # 1 vicryl suture. The subcutaneous tissue was closed with 2-0 vicryl continuous suture. The skin was closed with 3-0 vicryl  subcuticular suture. The right axillary artery incision was closed in layers in a similar manner.  All sponge, needle, and instrument counts were reported correct at the end of the case. Dry sterile dressings were placed over the incisions and around the chest tubes which were connected to pleurevac suction. The patient was then transported to the surgical intensive care unit in stable condition.

## 2020-04-16 NOTE — Procedures (Signed)
Extubation Procedure Note  Patient Details:   Name: Johnathan Murray DOB: Oct 16, 1951 MRN: [id number]   Airway Documentation:    Vent end date: 04/16/20 Vent end time: 2248   Evaluation  O2 sats: stable throughout Complications: No apparent complications Patient did tolerate procedure well. Bilateral Breath Sounds: Clear, Diminished   Yes, pt able to hoarsely vocalize name and DOB and cough to clear secretions. Pt had NIF of -23 and VC of 750. Pt positive for cuff leak prior to extubation. Placed on 5L humidified nasal cannula and tolerating well at this time.   Virgilio Frees 04/16/2020, 10:52 PM

## 2020-04-16 NOTE — Progress Notes (Signed)
Settings modified per rapid wean protocol

## 2020-04-16 NOTE — Progress Notes (Signed)
Rapid wean protocol started at this time

## 2020-04-16 NOTE — Anesthesia Procedure Notes (Signed)
Arterial Line Insertion Start/End5/27/2021 7:10 AM, 04/16/2020 7:11 AM Performed by: Neldon Newport, CRNA, CRNA  Preanesthetic checklist: patient identified, IV checked, site marked, risks and benefits discussed, surgical consent, monitors and equipment checked, pre-op evaluation, timeout performed and anesthesia consent Lidocaine 1% used for infiltration and patient sedated Right, radial was placed Catheter size: 20 G Hand hygiene performed  and maximum sterile barriers used  Allen's test indicative of satisfactory collateral circulation Attempts: 1 Procedure performed without using ultrasound guided technique. Following insertion, dressing applied and Biopatch. Post procedure assessment: normal  Patient tolerated the procedure well with no immediate complications.

## 2020-04-16 NOTE — Progress Notes (Deleted)
  Echocardiogram 2D Echocardiogram has been performed.  Johnathan Murray 04/16/2020, 9:20 AM

## 2020-04-16 NOTE — Anesthesia Procedure Notes (Signed)
Procedure Name: Intubation Date/Time: 04/16/2020 7:54 AM Performed by: Inda Coke, CRNA Pre-anesthesia Checklist: Patient identified, Emergency Drugs available, Suction available and Patient being monitored Patient Re-evaluated:Patient Re-evaluated prior to induction Oxygen Delivery Method: Circle System Utilized Preoxygenation: Pre-oxygenation with 100% oxygen Induction Type: IV induction Ventilation: Mask ventilation without difficulty and Oral airway inserted - appropriate to patient size Laryngoscope Size: Mac and 4 Grade View: Grade I Tube type: Oral Tube size: 8.0 mm Number of attempts: 1 Airway Equipment and Method: Stylet and Oral airway Placement Confirmation: ETT inserted through vocal cords under direct vision,  positive ETCO2 and breath sounds checked- equal and bilateral Secured at: 23 cm Tube secured with: Tape Dental Injury: Teeth and Oropharynx as per pre-operative assessment

## 2020-04-16 NOTE — Anesthesia Procedure Notes (Signed)
Central Venous Catheter Insertion Performed by: Suzette Battiest, MD, anesthesiologist Start/End5/27/2021 6:40 AM, 04/16/2020 7:00 AM Patient location: Pre-op. Preanesthetic checklist: patient identified, IV checked, site marked, risks and benefits discussed, surgical consent, monitors and equipment checked, pre-op evaluation, timeout performed and anesthesia consent Position: Trendelenburg Lidocaine 1% used for infiltration and patient sedated Hand hygiene performed , maximum sterile barriers used  and Seldinger technique used Total catheter length 10. Central line and PA cath was placed.MAC introducer Swan type:thermodilution PA Cath depth:50 Procedure performed using ultrasound guided technique. Ultrasound Notes:anatomy identified, needle tip was noted to be adjacent to the nerve/plexus identified, no ultrasound evidence of intravascular and/or intraneural injection and image(s) printed for medical record Attempts: 1 Following insertion, line sutured, dressing applied and Biopatch. Post procedure assessment: blood return through all ports, free fluid flow and no air  Patient tolerated the procedure well with no immediate complications.

## 2020-04-16 NOTE — Anesthesia Procedure Notes (Signed)
Central Venous Catheter Insertion Performed by: Suzette Battiest, MD, anesthesiologist Start/End5/27/2021 6:40 AM, 04/16/2020 7:00 AM Patient location: Pre-op. Preanesthetic checklist: patient identified, IV checked, site marked, risks and benefits discussed, surgical consent, monitors and equipment checked, pre-op evaluation, timeout performed and anesthesia consent Hand hygiene performed  and maximum sterile barriers used  PA cath was placed.Swan type:thermodilution PA Cath depth:50 Procedure performed without using ultrasound guided technique. Attempts: 1 Patient tolerated the procedure well with no immediate complications.

## 2020-04-16 NOTE — Brief Op Note (Signed)
04/13/2020 - 04/16/2020  4:35 PM  PATIENT:  Johnathan Murray  69 y.o. male  PRE-OPERATIVE DIAGNOSIS:  1. DISSEMINATED ENTEROCOCCUS FAECALIS 2. AORTIC ROOT ABCESS 3. PROSTHETIC VALVE ENDOCARDITIS  POST-OPERATIVE DIAGNOSIS:  1. DISSEMINATED ENTEROCOCCUS FAECALIS 2. AORTIC ROOT ABSCESS  PROCEDURE: TRANSESOPHAGEAL ECHOCARDIOGRAM (TEE), REDO STERNOTOMY, ASCENDING AORTIC ROOT REPLACEMENT  With HOMOGRAFT using [employer] 23 MM Cryo Aortic Valve, REIMPLANTATION of CORONARY ARTERIES 3. PROSTHETIC VALVE ENDOCARDITIS  SURGEON:  Surgeon(s) and Role:    Bartle, Fernande Boyden, MD - Primary  PHYSICIAN ASSISTANT: Lars Pinks PA-C  ASSISTANTS: Rodman Key RNFA  ANESTHESIA:   general  EBL:  Per perfusion and anesthesia record  BLOOD ADMINISTERED:Two CC PRBC, two FFP and two PLTS, and 2 Cryo  DRAINS: Chest tubes placed in the mediastianl and pleural spaces   SPECIMEN:  Source of Specimen:  Aortic valve (previous replacement), felt strips, portion of aorta  DISPOSITION OF SPECIMEN:  Culture, AFB, and fungal  COUNTS CORRECT:  YES  DICTATION: .Dragon Dictation  PLAN OF CARE: Admit to inpatient   PATIENT DISPOSITION:  ICU - intubated and hemodynamically stable.   Delay start of Pharmacological VTE agent (>24hrs) due to surgical blood loss or risk of bleeding: yes  BASELINE WEIGHT: 78.7 kg

## 2020-04-16 NOTE — Transfer of Care (Addendum)
Immediate Anesthesia Transfer of Care Note  Patient: [name]  Procedure(s) Performed: REDO STERNOTOMY (N/A Chest) ASCENDING AORTIC ROOT REPLACEMENT - HOMOGRAFT using [employer] 23 MM Cryo Aortic Valve. (N/A Chest) TRANSESOPHAGEAL ECHOCARDIOGRAM (TEE) (N/A )  Patient Location: SICU  Anesthesia Type:General  Level of Consciousness: Patient remains intubated per anesthesia plan  Airway & Oxygen Therapy: Patient remains intubated per anesthesia plan and Patient placed on Ventilator (see vital sign flow sheet for setting)  Post-op Assessment: Report given to RN and Post -op Vital signs reviewed and stable  Post vital signs: Reviewed and stable BP 104/58, SR HR 90, pox 100%  Last Vitals:  Vitals Value Taken Time  BP    Temp    Pulse    Resp    SpO2      Last Pain:  Vitals:   04/16/20 0500  TempSrc: Oral  PainSc:       Patients Stated Pain Goal: 0 ([id number])  Complications: No apparent anesthesia complications

## 2020-04-16 NOTE — Progress Notes (Signed)
[place] for IV Heparin Indication: atrial fibrillation  Allergies  Allergen Reactions  . Atorvastatin Other (See Comments)    Urinary retention   . Nebivolol Diarrhea    Patient Measurements: Weight: 81.3 kg (179 lb 3.7 oz) Heparin Dosing Weight: 81.3 kg  Vital Signs: Temp: 99.3 F (37.4 C) (05/26 2042) Temp Source: Oral (05/26 2042) BP: 122/67 (05/26 2042) Pulse Rate: 79 (05/26 2042)  Labs: Recent Labs    04/13/20 0418 04/13/20 1940 04/14/20 0404 04/14/20 0404 04/14/20 1331 04/14/20 1331 04/14/20 1855 04/15/20 1012 04/15/20 1842 04/16/20 0231  HGB 7.5*  --  6.7*   < > 6.5*   < > 8.3* 9.1*  --   --   HCT 24.0*  --  21.9*   < > 21.7*  --  26.3* 29.6*  --   --   PLT 383  --  362  --   --   --   --  372  --   --   APTT  --    < > 56*   < > 54*   < >  --  39* 56* 69*  HEPARINUNFRC  --    < > 1.88*  --   --   --   --  0.56  --  0.58  CREATININE 1.43*  --  1.48*  --   --   --   --  1.45*  --   --    < > = values in this interval not displayed.    Estimated Creatinine Clearance: 51 mL/min (A) (by C-G formula based on SCr of 1.45 mg/dL (H)).  Assessment: 69 yr old male with prosthetic valve endocarditis, aortic root  abscess, disciitis, septic arthritis, enterococcal bacteremia with  atrial fibrillation, on apixaban PTA and continuing on apixaban while inpatient (last dose: apixaban 5 mg PO at 0850 AM today). Pt had TEE-DCCV on 04/13/20, which revealed evolved aortic root abscess with vegetations on leaflet of prosthetic valve. Pharmacy consulted to dose IV heparin pending evaluation for valve replacement on 5/27.   PTT 69 sec (therapeutic), heparin level 0.58 (still being affected by apixaban)  Pt is scheduled for repeat aortic valve and aortic root replacement using homograft tomorrow, 5/27.  Goal of Therapy:  aPTT 66-85 sec Heparin level = 0.3-0.5 units/ml Monitor platelets by anticoagulation protocol: Yes   Plan:  Continue  heparin infusion at 1450 units/hr F/U after surgery tomorrow  Sherlon Handing, PharmD, BCPS Please see amion for complete clinical pharmacist phone list 04/16/2020 3:44 AM

## 2020-04-16 NOTE — Anesthesia Procedure Notes (Signed)
Arterial Line Insertion Start/End5/27/2021 7:00 AM, 04/16/2020 7:05 AM Performed by: Inda Coke, CRNA, CRNA  Preanesthetic checklist: patient identified, IV checked, site marked, risks and benefits discussed, surgical consent, monitors and equipment checked, pre-op evaluation, timeout performed and anesthesia consent Lidocaine 1% used for infiltration and patient sedated Left, radial was placed Catheter size: 20 G Hand hygiene performed  Allen's test indicative of satisfactory collateral circulation Attempts: 2 Procedure performed without using ultrasound guided technique. Following insertion, dressing applied and Biopatch. Post procedure assessment: normal  Patient tolerated the procedure well with no immediate complications.

## 2020-04-16 NOTE — Anesthesia Preprocedure Evaluation (Addendum)
Anesthesia Evaluation  Patient identified by MRN, date of birth, ID band Patient awake    Reviewed: Allergy & Precautions, NPO status , Patient's Chart, lab work & pertinent test results  Airway Mallampati: I  TM Distance: >3 FB Neck ROM: Full    Dental   Pulmonary    Pulmonary exam normal        Cardiovascular hypertension, Pt. on medications + CAD  Normal cardiovascular exam+ dysrhythmias Atrial Fibrillation + Valvular Problems/Murmurs AS      Neuro/Psych    GI/Hepatic GERD  Medicated and Controlled,  Endo/Other  diabetes, Type 2  Renal/GU      Musculoskeletal   Abdominal   Peds  Hematology   Anesthesia Other Findings   Reproductive/Obstetrics                             Anesthesia Physical Anesthesia Plan  ASA: III  Anesthesia Plan: General   Post-op Pain Management:    Induction: Intravenous  PONV Risk Score and Plan: 2 and Treatment may vary due to age or medical condition  Airway Management Planned: Oral ETT  Additional Equipment: Arterial line, PA Cath, TEE and Ultrasound Guidance Line Placement  Intra-op Plan:   Post-operative Plan: Post-operative intubation/ventilation  Informed Consent: I have reviewed the patients History and Physical, chart, labs and discussed the procedure including the risks, benefits and alternatives for the proposed anesthesia with the patient or authorized representative who has indicated his/her understanding and acceptance.       Plan Discussed with: CRNA and Surgeon  Anesthesia Plan Comments:         Anesthesia Quick Evaluation

## 2020-04-16 NOTE — Progress Notes (Signed)
Patient was taken to the OR for TEE and cardiothoracic surgery early in the morning.  He was in the OR the whole day.  He is a still in OR.  From the latest cardiothoracic surgery note, it appears patient will be transferred to ICU after surgery.

## 2020-04-17 ENCOUNTER — Inpatient Hospital Stay (HOSPITAL_COMMUNITY): Payer: BC Managed Care – PPO

## 2020-04-17 ENCOUNTER — Encounter: Payer: Self-pay | Admitting: *Deleted

## 2020-04-17 DIAGNOSIS — I330 Acute and subacute infective endocarditis: Secondary | ICD-10-CM | POA: Diagnosis not present

## 2020-04-17 DIAGNOSIS — Z952 Presence of prosthetic heart valve: Secondary | ICD-10-CM

## 2020-04-17 DIAGNOSIS — R7881 Bacteremia: Secondary | ICD-10-CM | POA: Diagnosis not present

## 2020-04-17 DIAGNOSIS — I2510 Atherosclerotic heart disease of native coronary artery without angina pectoris: Secondary | ICD-10-CM | POA: Diagnosis not present

## 2020-04-17 DIAGNOSIS — K6812 Psoas muscle abscess: Secondary | ICD-10-CM | POA: Diagnosis not present

## 2020-04-17 DIAGNOSIS — G061 Intraspinal abscess and granuloma: Secondary | ICD-10-CM | POA: Diagnosis not present

## 2020-04-17 DIAGNOSIS — J9811 Atelectasis: Secondary | ICD-10-CM | POA: Diagnosis not present

## 2020-04-17 DIAGNOSIS — T826XXA Infection and inflammatory reaction due to cardiac valve prosthesis, initial encounter: Secondary | ICD-10-CM | POA: Diagnosis not present

## 2020-04-17 LAB — PREPARE FRESH FROZEN PLASMA: Unit division: 0

## 2020-04-17 LAB — PREPARE PLATELET PHERESIS
Unit division: 0
Unit division: 0

## 2020-04-17 LAB — POCT I-STAT 7, (LYTES, BLD GAS, ICA,H+H)
Acid-Base Excess: 7 mmol/L — ABNORMAL HIGH (ref 0.0–2.0)
Acid-Base Excess: 5 mmol/L — ABNORMAL HIGH (ref 0.0–2.0)
Acid-Base Excess: 8 mmol/L — ABNORMAL HIGH (ref 0.0–2.0)
Acid-Base Excess: 5 mmol/L — ABNORMAL HIGH (ref 0.0–2.0)
Acid-Base Excess: 5 mmol/L — ABNORMAL HIGH (ref 0.0–2.0)
Acid-Base Excess: 5 mmol/L — ABNORMAL HIGH (ref 0.0–2.0)
Acid-Base Excess: 2 mmol/L (ref 0.0–2.0)
Acid-Base Excess: 3 mmol/L — ABNORMAL HIGH (ref 0.0–2.0)
Bicarbonate: 31.5 mmol/L — ABNORMAL HIGH (ref 20.0–28.0)
Bicarbonate: 29.6 mmol/L — ABNORMAL HIGH (ref 20.0–28.0)
Bicarbonate: 31.4 mmol/L — ABNORMAL HIGH (ref 20.0–28.0)
Bicarbonate: 29.9 mmol/L — ABNORMAL HIGH (ref 20.0–28.0)
Bicarbonate: 29.1 mmol/L — ABNORMAL HIGH (ref 20.0–28.0)
Bicarbonate: 28.7 mmol/L — ABNORMAL HIGH (ref 20.0–28.0)
Bicarbonate: 26.2 mmol/L (ref 20.0–28.0)
Bicarbonate: 27.9 mmol/L (ref 20.0–28.0)
Calcium, Ion: 1.27 mmol/L (ref 1.15–1.40)
Calcium, Ion: 1.09 mmol/L — ABNORMAL LOW (ref 1.15–1.40)
Calcium, Ion: 1.18 mmol/L (ref 1.15–1.40)
Calcium, Ion: 1.19 mmol/L (ref 1.15–1.40)
Calcium, Ion: 1.16 mmol/L (ref 1.15–1.40)
Calcium, Ion: 1.15 mmol/L (ref 1.15–1.40)
Calcium, Ion: 1.12 mmol/L — ABNORMAL LOW (ref 1.15–1.40)
Calcium, Ion: 1.12 mmol/L — ABNORMAL LOW (ref 1.15–1.40)
HCT: 24 % — ABNORMAL LOW (ref 39.0–52.0)
HCT: 24 % — ABNORMAL LOW (ref 39.0–52.0)
HCT: 24 % — ABNORMAL LOW (ref 39.0–52.0)
HCT: 27 % — ABNORMAL LOW (ref 39.0–52.0)
HCT: 27 % — ABNORMAL LOW (ref 39.0–52.0)
HCT: 26 % — ABNORMAL LOW (ref 39.0–52.0)
HCT: 23 % — ABNORMAL LOW (ref 39.0–52.0)
HCT: 27 % — ABNORMAL LOW (ref 39.0–52.0)
Hemoglobin: 8.2 g/dL — ABNORMAL LOW (ref 13.0–17.0)
Hemoglobin: 8.2 g/dL — ABNORMAL LOW (ref 13.0–17.0)
Hemoglobin: 8.2 g/dL — ABNORMAL LOW (ref 13.0–17.0)
Hemoglobin: 9.2 g/dL — ABNORMAL LOW (ref 13.0–17.0)
Hemoglobin: 9.2 g/dL — ABNORMAL LOW (ref 13.0–17.0)
Hemoglobin: 8.8 g/dL — ABNORMAL LOW (ref 13.0–17.0)
Hemoglobin: 7.8 g/dL — ABNORMAL LOW (ref 13.0–17.0)
Hemoglobin: 9.2 g/dL — ABNORMAL LOW (ref 13.0–17.0)
O2 Saturation: 100 %
O2 Saturation: 100 %
O2 Saturation: 100 %
O2 Saturation: 100 %
O2 Saturation: 100 %
O2 Saturation: 100 %
O2 Saturation: 100 %
O2 Saturation: 98 %
Potassium: 3.8 mmol/L (ref 3.5–5.1)
Potassium: 4.4 mmol/L (ref 3.5–5.1)
Potassium: 3.7 mmol/L (ref 3.5–5.1)
Potassium: 4.5 mmol/L (ref 3.5–5.1)
Potassium: 4.6 mmol/L (ref 3.5–5.1)
Potassium: 4.4 mmol/L (ref 3.5–5.1)
Potassium: 4.6 mmol/L (ref 3.5–5.1)
Potassium: 5.3 mmol/L — ABNORMAL HIGH (ref 3.5–5.1)
Sodium: 137 mmol/L (ref 135–145)
Sodium: 139 mmol/L (ref 135–145)
Sodium: 139 mmol/L (ref 135–145)
Sodium: 139 mmol/L (ref 135–145)
Sodium: 138 mmol/L (ref 135–145)
Sodium: 138 mmol/L (ref 135–145)
Sodium: 138 mmol/L (ref 135–145)
Sodium: 139 mmol/L (ref 135–145)
TCO2: 33 mmol/L — ABNORMAL HIGH (ref 22–32)
TCO2: 31 mmol/L (ref 22–32)
TCO2: 33 mmol/L — ABNORMAL HIGH (ref 22–32)
TCO2: 31 mmol/L (ref 22–32)
TCO2: 30 mmol/L (ref 22–32)
TCO2: 30 mmol/L (ref 22–32)
TCO2: 27 mmol/L (ref 22–32)
TCO2: 29 mmol/L (ref 22–32)
pCO2 arterial: 44.3 mmHg (ref 32.0–48.0)
pCO2 arterial: 42.7 mmHg (ref 32.0–48.0)
pCO2 arterial: 39.6 mmHg (ref 32.0–48.0)
pCO2 arterial: 43.3 mmHg (ref 32.0–48.0)
pCO2 arterial: 38.6 mmHg (ref 32.0–48.0)
pCO2 arterial: 39.9 mmHg (ref 32.0–48.0)
pCO2 arterial: 39.3 mmHg (ref 32.0–48.0)
pCO2 arterial: 41.5 mmHg (ref 32.0–48.0)
pH, Arterial: 7.46 — ABNORMAL HIGH (ref 7.350–7.450)
pH, Arterial: 7.449 (ref 7.350–7.450)
pH, Arterial: 7.507 — ABNORMAL HIGH (ref 7.350–7.450)
pH, Arterial: 7.447 (ref 7.350–7.450)
pH, Arterial: 7.485 — ABNORMAL HIGH (ref 7.350–7.450)
pH, Arterial: 7.464 — ABNORMAL HIGH (ref 7.350–7.450)
pH, Arterial: 7.431 (ref 7.350–7.450)
pH, Arterial: 7.436 (ref 7.350–7.450)
pO2, Arterial: 524 mmHg — ABNORMAL HIGH (ref 83.0–108.0)
pO2, Arterial: 320 mmHg — ABNORMAL HIGH (ref 83.0–108.0)
pO2, Arterial: 313 mmHg — ABNORMAL HIGH (ref 83.0–108.0)
pO2, Arterial: 324 mmHg — ABNORMAL HIGH (ref 83.0–108.0)
pO2, Arterial: 327 mmHg — ABNORMAL HIGH (ref 83.0–108.0)
pO2, Arterial: 316 mmHg — ABNORMAL HIGH (ref 83.0–108.0)
pO2, Arterial: 310 mmHg — ABNORMAL HIGH (ref 83.0–108.0)
pO2, Arterial: 94 mmHg (ref 83.0–108.0)

## 2020-04-17 LAB — POCT I-STAT, CHEM 8
BUN: 15 mg/dL (ref 8–23)
BUN: 15 mg/dL (ref 8–23)
BUN: 14 mg/dL (ref 8–23)
BUN: 15 mg/dL (ref 8–23)
BUN: 15 mg/dL (ref 8–23)
BUN: 15 mg/dL (ref 8–23)
BUN: 16 mg/dL (ref 8–23)
BUN: 15 mg/dL (ref 8–23)
BUN: 15 mg/dL (ref 8–23)
Calcium, Ion: 1.29 mmol/L (ref 1.15–1.40)
Calcium, Ion: 1.25 mmol/L (ref 1.15–1.40)
Calcium, Ion: 1.08 mmol/L — ABNORMAL LOW (ref 1.15–1.40)
Calcium, Ion: 1.2 mmol/L (ref 1.15–1.40)
Calcium, Ion: 1.13 mmol/L — ABNORMAL LOW (ref 1.15–1.40)
Calcium, Ion: 1.1 mmol/L — ABNORMAL LOW (ref 1.15–1.40)
Calcium, Ion: 1.18 mmol/L (ref 1.15–1.40)
Calcium, Ion: 1.09 mmol/L — ABNORMAL LOW (ref 1.15–1.40)
Calcium, Ion: 1.1 mmol/L — ABNORMAL LOW (ref 1.15–1.40)
Chloride: 101 mmol/L (ref 98–111)
Chloride: 101 mmol/L (ref 98–111)
Chloride: 99 mmol/L (ref 98–111)
Chloride: 103 mmol/L (ref 98–111)
Chloride: 102 mmol/L (ref 98–111)
Chloride: 103 mmol/L (ref 98–111)
Chloride: 101 mmol/L (ref 98–111)
Chloride: 103 mmol/L (ref 98–111)
Chloride: 103 mmol/L (ref 98–111)
Creatinine, Ser: 1.2 mg/dL (ref 0.61–1.24)
Creatinine, Ser: 1.1 mg/dL (ref 0.61–1.24)
Creatinine, Ser: 1 mg/dL (ref 0.61–1.24)
Creatinine, Ser: 1.2 mg/dL (ref 0.61–1.24)
Creatinine, Ser: 1 mg/dL (ref 0.61–1.24)
Creatinine, Ser: 1.1 mg/dL (ref 0.61–1.24)
Creatinine, Ser: 1 mg/dL (ref 0.61–1.24)
Creatinine, Ser: 0.9 mg/dL (ref 0.61–1.24)
Creatinine, Ser: 0.9 mg/dL (ref 0.61–1.24)
Glucose, Bld: 130 mg/dL — ABNORMAL HIGH (ref 70–99)
Glucose, Bld: 111 mg/dL — ABNORMAL HIGH (ref 70–99)
Glucose, Bld: 121 mg/dL — ABNORMAL HIGH (ref 70–99)
Glucose, Bld: 117 mg/dL — ABNORMAL HIGH (ref 70–99)
Glucose, Bld: 111 mg/dL — ABNORMAL HIGH (ref 70–99)
Glucose, Bld: 119 mg/dL — ABNORMAL HIGH (ref 70–99)
Glucose, Bld: 124 mg/dL — ABNORMAL HIGH (ref 70–99)
Glucose, Bld: 111 mg/dL — ABNORMAL HIGH (ref 70–99)
Glucose, Bld: 133 mg/dL — ABNORMAL HIGH (ref 70–99)
HCT: 26 % — ABNORMAL LOW (ref 39.0–52.0)
HCT: 23 % — ABNORMAL LOW (ref 39.0–52.0)
HCT: 29 % — ABNORMAL LOW (ref 39.0–52.0)
HCT: 27 % — ABNORMAL LOW (ref 39.0–52.0)
HCT: 25 % — ABNORMAL LOW (ref 39.0–52.0)
HCT: 26 % — ABNORMAL LOW (ref 39.0–52.0)
HCT: 28 % — ABNORMAL LOW (ref 39.0–52.0)
HCT: 23 % — ABNORMAL LOW (ref 39.0–52.0)
HCT: 29 % — ABNORMAL LOW (ref 39.0–52.0)
Hemoglobin: 8.8 g/dL — ABNORMAL LOW (ref 13.0–17.0)
Hemoglobin: 7.8 g/dL — ABNORMAL LOW (ref 13.0–17.0)
Hemoglobin: 9.9 g/dL — ABNORMAL LOW (ref 13.0–17.0)
Hemoglobin: 9.2 g/dL — ABNORMAL LOW (ref 13.0–17.0)
Hemoglobin: 8.5 g/dL — ABNORMAL LOW (ref 13.0–17.0)
Hemoglobin: 8.8 g/dL — ABNORMAL LOW (ref 13.0–17.0)
Hemoglobin: 9.5 g/dL — ABNORMAL LOW (ref 13.0–17.0)
Hemoglobin: 7.8 g/dL — ABNORMAL LOW (ref 13.0–17.0)
Hemoglobin: 9.9 g/dL — ABNORMAL LOW (ref 13.0–17.0)
Potassium: 3.8 mmol/L (ref 3.5–5.1)
Potassium: 4 mmol/L (ref 3.5–5.1)
Potassium: 4.4 mmol/L (ref 3.5–5.1)
Potassium: 4.7 mmol/L (ref 3.5–5.1)
Potassium: 4.7 mmol/L (ref 3.5–5.1)
Potassium: 4.7 mmol/L (ref 3.5–5.1)
Potassium: 4.5 mmol/L (ref 3.5–5.1)
Potassium: 4.9 mmol/L (ref 3.5–5.1)
Potassium: 4.6 mmol/L (ref 3.5–5.1)
Sodium: 135 mmol/L (ref 135–145)
Sodium: 138 mmol/L (ref 135–145)
Sodium: 138 mmol/L (ref 135–145)
Sodium: 138 mmol/L (ref 135–145)
Sodium: 138 mmol/L (ref 135–145)
Sodium: 137 mmol/L (ref 135–145)
Sodium: 142 mmol/L (ref 135–145)
Sodium: 136 mmol/L (ref 135–145)
Sodium: 138 mmol/L (ref 135–145)
TCO2: 31 mmol/L (ref 22–32)
TCO2: 29 mmol/L (ref 22–32)
TCO2: 30 mmol/L (ref 22–32)
TCO2: 32 mmol/L (ref 22–32)
TCO2: 30 mmol/L (ref 22–32)
TCO2: 30 mmol/L (ref 22–32)
TCO2: 26 mmol/L (ref 22–32)
TCO2: 30 mmol/L (ref 22–32)
TCO2: 25 mmol/L (ref 22–32)

## 2020-04-17 LAB — PREPARE CRYOPRECIPITATE
Unit division: 0
Unit division: 0

## 2020-04-17 LAB — BASIC METABOLIC PANEL
Anion gap: 7 (ref 5–15)
Anion gap: 10 (ref 5–15)
BUN: 16 mg/dL (ref 8–23)
BUN: 15 mg/dL (ref 8–23)
CO2: 25 mmol/L (ref 22–32)
CO2: 23 mmol/L (ref 22–32)
Calcium: 7.9 mg/dL — ABNORMAL LOW (ref 8.9–10.3)
Calcium: 8.3 mg/dL — ABNORMAL LOW (ref 8.9–10.3)
Chloride: 107 mmol/L (ref 98–111)
Chloride: 106 mmol/L (ref 98–111)
Creatinine, Ser: 1.13 mg/dL (ref 0.61–1.24)
Creatinine, Ser: 1.13 mg/dL (ref 0.61–1.24)
GFR calc Af Amer: >60 mL/min (ref >60)
GFR calc Af Amer: >60 mL/min (ref >60)
GFR calc non Af Amer: >60 mL/min (ref >60)
GFR calc non Af Amer: >60 mL/min (ref >60)
Glucose, Bld: 177 mg/dL — ABNORMAL HIGH (ref 70–99)
Glucose, Bld: 90 mg/dL (ref 70–99)
Potassium: 4.2 mmol/L (ref 3.5–5.1)
Potassium: 3.7 mmol/L (ref 3.5–5.1)
Sodium: 139 mmol/L (ref 135–145)
Sodium: 139 mmol/L (ref 135–145)

## 2020-04-17 LAB — BPAM PLATELET PHERESIS
Blood Product Expiration Date: 202105292359
Blood Product Expiration Date: 202105302359
ISSUE DATE / TIME: 202105271618
ISSUE DATE / TIME: 202105271618
Unit Type and Rh: 6200
Unit Type and Rh: 7300

## 2020-04-17 LAB — GLUCOSE, CAPILLARY
Glucose-Capillary: 156 mg/dL — ABNORMAL HIGH (ref 70–99)
Glucose-Capillary: 160 mg/dL — ABNORMAL HIGH (ref 70–99)
Glucose-Capillary: 153 mg/dL — ABNORMAL HIGH (ref 70–99)
Glucose-Capillary: 147 mg/dL — ABNORMAL HIGH (ref 70–99)
Glucose-Capillary: 160 mg/dL — ABNORMAL HIGH (ref 70–99)
Glucose-Capillary: 143 mg/dL — ABNORMAL HIGH (ref 70–99)
Glucose-Capillary: 125 mg/dL — ABNORMAL HIGH (ref 70–99)
Glucose-Capillary: 131 mg/dL — ABNORMAL HIGH (ref 70–99)
Glucose-Capillary: 130 mg/dL — ABNORMAL HIGH (ref 70–99)
Glucose-Capillary: 111 mg/dL — ABNORMAL HIGH (ref 70–99)
Glucose-Capillary: 83 mg/dL (ref 70–99)
Glucose-Capillary: 141 mg/dL — ABNORMAL HIGH (ref 70–99)

## 2020-04-17 LAB — CBC
HCT: 20.9 % — ABNORMAL LOW (ref 39.0–52.0)
HCT: 27.6 % — ABNORMAL LOW (ref 39.0–52.0)
Hemoglobin: 7 g/dL — ABNORMAL LOW (ref 13.0–17.0)
Hemoglobin: 9.3 g/dL — ABNORMAL LOW (ref 13.0–17.0)
MCH: 29.8 pg (ref 26.0–34.0)
MCH: 29.5 pg (ref 26.0–34.0)
MCHC: 33.5 g/dL (ref 30.0–36.0)
MCHC: 33.7 g/dL (ref 30.0–36.0)
MCV: 88.9 fL (ref 80.0–100.0)
MCV: 87.6 fL (ref 80.0–100.0)
Platelets: 160 10*3/uL (ref 150–400)
Platelets: 161 10*3/uL (ref 150–400)
RBC: 2.35 MIL/uL — ABNORMAL LOW (ref 4.22–5.81)
RBC: 3.15 MIL/uL — ABNORMAL LOW (ref 4.22–5.81)
RDW: 15.6 % — ABNORMAL HIGH (ref 11.5–15.5)
RDW: 15.1 % (ref 11.5–15.5)
WBC: 12 10*3/uL — ABNORMAL HIGH (ref 4.0–10.5)
WBC: 11 10*3/uL — ABNORMAL HIGH (ref 4.0–10.5)
nRBC: 0 % (ref 0.0–0.2)
nRBC: 0 % (ref 0.0–0.2)

## 2020-04-17 LAB — BPAM FFP
Blood Product Expiration Date: 202105292359
Blood Product Expiration Date: 202105292359
ISSUE DATE / TIME: 202105271617
ISSUE DATE / TIME: 202105271617
Unit Type and Rh: 6200
Unit Type and Rh: 6200

## 2020-04-17 LAB — MAGNESIUM
Magnesium: 2.9 mg/dL — ABNORMAL HIGH (ref 1.7–2.4)
Magnesium: 2.6 mg/dL — ABNORMAL HIGH (ref 1.7–2.4)

## 2020-04-17 LAB — BPAM CRYOPRECIPITATE
Blood Product Expiration Date: 202105272206
Blood Product Expiration Date: 202105272206
ISSUE DATE / TIME: 202105271624
ISSUE DATE / TIME: 202105271624
Unit Type and Rh: 6200
Unit Type and Rh: 6200

## 2020-04-17 LAB — PREPARE RBC (CROSSMATCH)

## 2020-04-17 MED ORDER — FUROSEMIDE 10 MG/ML IJ SOLN: 40.0000 mg | Freq: Once | INTRAMUSCULAR | Status: DC

## 2020-04-17 MED ORDER — INSULIN DETEMIR 100 UNIT/ML ~~LOC~~ SOLN
25.0000 [IU] | Freq: Every day | SUBCUTANEOUS | Status: DC
  Administered 2020-04-17 – 2020-04-19 (×3): 25 [IU] via SUBCUTANEOUS
  Filled 2020-04-17 (×4): qty 0.25

## 2020-04-17 MED ORDER — SODIUM CHLORIDE 0.9% IV SOLUTION: Freq: Once | INTRAVENOUS | Status: DC

## 2020-04-17 MED ORDER — INSULIN DETEMIR 100 UNIT/ML ~~LOC~~ SOLN: 25.0000 [IU] | Freq: Every day | SUBCUTANEOUS | Status: DC

## 2020-04-17 MED ORDER — SODIUM CHLORIDE 0.9% FLUSH
10.0000 mL | Freq: Two times a day (BID) | INTRAVENOUS | Status: DC
  Administered 2020-04-17 – 2020-04-23 (×4): 10 mL

## 2020-04-17 MED ORDER — ENSURE ENLIVE PO LIQD
237.0000 mL | Freq: Three times a day (TID) | ORAL | Status: DC
  Administered 2020-04-17 – 2020-04-30 (×29): 237 mL via ORAL

## 2020-04-17 MED ORDER — AMIODARONE HCL 200 MG PO TABS
200.0000 mg | ORAL_TABLET | Freq: Two times a day (BID) | ORAL | Status: DC
  Administered 2020-04-17 – 2020-04-24 (×15): 200 mg via ORAL
  Filled 2020-04-17 (×15): qty 1

## 2020-04-17 MED ORDER — SODIUM CHLORIDE 0.9 % IV SOLN
2.0000 g | INTRAVENOUS | Status: DC
  Administered 2020-04-17 – 2020-04-30 (×76): 2 g via INTRAVENOUS
  Filled 2020-04-17 (×4): qty 2
  Filled 2020-04-17 (×2): qty 2000
  Filled 2020-04-17 (×2): qty 2
  Filled 2020-04-17: qty 2000
  Filled 2020-04-17 (×2): qty 2
  Filled 2020-04-17: qty 2000
  Filled 2020-04-17 (×3): qty 2
  Filled 2020-04-17 (×3): qty 2000
  Filled 2020-04-17: qty 2
  Filled 2020-04-17: qty 2000
  Filled 2020-04-17 (×13): qty 2
  Filled 2020-04-17: qty 2000
  Filled 2020-04-17 (×2): qty 2
  Filled 2020-04-17: qty 2000
  Filled 2020-04-17 (×8): qty 2
  Filled 2020-04-17 (×2): qty 2000
  Filled 2020-04-17 (×2): qty 2
  Filled 2020-04-17 (×2): qty 2000
  Filled 2020-04-17 (×5): qty 2
  Filled 2020-04-17: qty 2000
  Filled 2020-04-17 (×2): qty 2
  Filled 2020-04-17: qty 2000
  Filled 2020-04-17 (×8): qty 2
  Filled 2020-04-17: qty 2000
  Filled 2020-04-17 (×17): qty 2

## 2020-04-17 MED ORDER — DOPAMINE-DEXTROSE 3.2-5 MG/ML-% IV SOLN: 5.0000 ug/kg/min | INTRAVENOUS | Status: DC

## 2020-04-17 MED ORDER — INSULIN ASPART 100 UNIT/ML ~~LOC~~ SOLN
0.0000 [IU] | SUBCUTANEOUS | Status: DC
  Administered 2020-04-17 – 2020-04-20 (×4): 2 [IU] via SUBCUTANEOUS

## 2020-04-17 MED ORDER — FUROSEMIDE 10 MG/ML IJ SOLN
40.0000 mg | Freq: Two times a day (BID) | INTRAMUSCULAR | Status: DC
  Administered 2020-04-17 – 2020-04-18 (×3): 40 mg via INTRAVENOUS
  Filled 2020-04-17 (×3): qty 4

## 2020-04-17 MED ORDER — DOPAMINE-DEXTROSE 3.2-5 MG/ML-% IV SOLN
2.0000 ug/kg/min | INTRAVENOUS | Status: DC
  Administered 2020-04-17: 2 ug/kg/min via INTRAVENOUS

## 2020-04-17 MED ORDER — ENOXAPARIN SODIUM 40 MG/0.4ML ~~LOC~~ SOLN
40.0000 mg | Freq: Every day | SUBCUTANEOUS | Status: DC
  Administered 2020-04-17 – 2020-04-26 (×9): 40 mg via SUBCUTANEOUS
  Filled 2020-04-17 (×10): qty 0.4

## 2020-04-17 MED ORDER — FE FUMARATE-B12-VIT C-FA-IFC PO CAPS
1.0000 | ORAL_CAPSULE | Freq: Two times a day (BID) | ORAL | Status: DC
  Administered 2020-04-17 – 2020-04-30 (×26): 1 via ORAL
  Filled 2020-04-17 (×26): qty 1

## 2020-04-17 MED ORDER — ORAL CARE MOUTH RINSE
15.0000 mL | Freq: Two times a day (BID) | OROMUCOSAL | Status: DC
  Administered 2020-04-17 – 2020-04-23 (×11): 15 mL via OROMUCOSAL

## 2020-04-17 MED ORDER — SODIUM CHLORIDE 0.9% FLUSH: 10.0000 mL | INTRAVENOUS | Status: DC | PRN

## 2020-04-17 MED FILL — Potassium Chloride Inj 2 mEq/ML: INTRAVENOUS | Qty: 40 | Status: AC

## 2020-04-17 MED FILL — Thrombin (Recombinant) For Soln 20000 Unit: CUTANEOUS | Qty: 1 | Status: AC

## 2020-04-17 MED FILL — Heparin Sodium (Porcine) Inj 1000 Unit/ML: INTRAMUSCULAR | Qty: 30 | Status: AC

## 2020-04-17 MED FILL — Magnesium Sulfate Inj 50%: INTRAMUSCULAR | Qty: 10 | Status: AC

## 2020-04-17 NOTE — Plan of Care (Signed)

## 2020-04-17 NOTE — Progress Notes (Signed)
PHARMACY NOTE:  ANTIMICROBIAL RENAL DOSAGE ADJUSTMENT  Current antimicrobial regimen includes a mismatch between antimicrobial dosage and estimated renal function.  As per policy approved by the Pharmacy & Therapeutics and Medical Executive Committees, the antimicrobial dosage will be adjusted accordingly.  Current antimicrobial dosage: Ampicillin 2 gm Q 6 hours  Indication: Enterococcal endocarditis   Renal Function:  Estimated Creatinine Clearance: 71.1 mL/min (by C-G formula based on SCr of 1.13 mg/dL). []      On intermittent HD, scheduled: []      On CRRT    Antimicrobial dosage has been changed to:  Ampicillin 2 gm q 4 hours   Additional comments:   Thank you for allowing pharmacy to be a part of this patient's care.  Jimmy Footman, PharmD, BCPS, BCIDP Infectious Diseases Clinical Pharmacist Phone: [phone] 04/17/2020 7:57 AM

## 2020-04-17 NOTE — Anesthesia Postprocedure Evaluation (Signed)
Anesthesia Post Note  Patient: Johnathan Murray  Procedure(s) Performed: REDO STERNOTOMY (N/A Chest) ASCENDING AORTIC ROOT REPLACEMENT - HOMOGRAFT using [employer] 23 MM Cryo Aortic Valve. (N/A Chest) TRANSESOPHAGEAL ECHOCARDIOGRAM (TEE) (N/A )     Patient location during evaluation: SICU Anesthesia Type: General Level of consciousness: sedated Pain management: pain level controlled Vital Signs Assessment: post-procedure vital signs reviewed and stable Respiratory status: patient remains intubated per anesthesia plan Cardiovascular status: stable Postop Assessment: no apparent nausea or vomiting Anesthetic complications: no    Last Vitals:  Vitals:   04/17/20 0615 04/17/20 0630  BP:    Pulse: 89 90  Resp: 17 19  Temp: (!) 36.2 C 36.6 C  SpO2: 100% 97%    Last Pain:  Vitals:   04/17/20 0631  TempSrc:   PainSc: 5                  Nishika Parkhurst DAVID

## 2020-04-17 NOTE — Progress Notes (Signed)
       [facility]  Date of Admission:  04/13/2020      Total days of antibiotics   Ampicillin and Ceftriaxone     ASSESSMENT: Johnathan Murrayis a 68 y.o.malewith disseminated enterococcus faecalis infection involving acute discitis/osteomyelitis at L4-5, bilateral septic arthritis to knees (s/p I&D 03/24/20) and aortic root abscess with vegetation on prosthetic valve (s/p valve/root replacement 5/27).  He is doing well post op and on minimal drips. Still junctional rhythm underlying temp pacer.   Valve tissue cultures so far negative preliminarily. Will hold on PICC for now as he has plenty of access at the moment post-op open heart surgery. Will need 6 weeks of dual therapy with ampicillin and ceftriaxone, restarting his clock from heart surgery.   Long discussion about back pain and expectation for recovery from discitis. No surgery indicated, only prolonged course of antibiotics. He will need a repeat MRI at the end of projected IV therapy to follow up epidural phlegmon; likely he will require some oral amoxicillin for an additional few weeks given burden of infection.     PLAN: 1. Continue IV ampicillin + ceftriaxone  2. Follow valve tissue  3. Post op care per Johnathan Murray   Active Problems:   Bacteremia due to Enterococcus   Aortic valve abscess   Pseudogout of left knee   Septic arthritis (HCC)   Discitis   Prosthetic valve endocarditis (HCC)   Endocarditis   . sodium chloride   Intravenous Once  . acetaminophen  1,000 mg Oral Q6H   Or  . acetaminophen (TYLENOL) oral liquid 160 mg/5 mL  1,000 mg Per Tube Q6H  . amiodarone  200 mg Oral BID  . vitamin C  500 mg Oral Daily  . aspirin EC  325 mg Oral Daily   Or  . aspirin  324 mg Per Tube Daily  . bisacodyl  10 mg Oral Daily   Or  . bisacodyl  10 mg Rectal Daily  . chlorhexidine gluconate (MEDLINE KIT)  15 mL Mouth Rinse BID  . Chlorhexidine Gluconate Cloth  6 each Topical Daily   . docusate sodium  200 mg Oral Daily  . enoxaparin (LOVENOX) injection  40 mg Subcutaneous QHS  . ferrous fumarate-b12-vitamic C-folic acid  1 capsule Oral BID PC  . furosemide  40 mg Intravenous BID  . insulin aspart  0-24 Units Subcutaneous Q4H  . insulin detemir  25 Units Subcutaneous Daily  . levothyroxine  25 mcg Oral QAC breakfast  . mouth rinse  15 mL Mouth Rinse BID  . oxyCODONE  10 mg Oral Q12H  . [START ON 04/18/2020] pantoprazole  40 mg Oral Daily  . pregabalin  100 mg Oral TID  . sodium chloride flush  10-40 mL Intracatheter Q12H  . sodium chloride flush  3 mL Intravenous Q12H    SUBJECTIVE: Still with severe back pain. Worse since surgery. Can raise legs and normal sensation bilaterally.  No deficits to LEs.    Review of Systems: Review of Systems  Constitutional: Negative for chills, diaphoresis and fever.  Respiratory: Negative for cough and shortness of breath.   Cardiovascular: Negative for chest pain and palpitations.  Genitourinary: Negative for dysuria.  Musculoskeletal: Positive for back pain. Negative for joint pain.  Skin: Negative for rash.    Allergies  Allergen Reactions  . Atorvastatin Other (See Comments)    Urinary retention   . Nebivolol Diarrhea    OBJECTIVE: Vitals:   04/17/20   1112 04/17/20 1115 04/17/20 1145 04/17/20 1200  BP:      Pulse: 90 90 90 89  Resp: 15 (!) 23 15 13  Temp: (!) 97.2 F (36.2 C) 98.4 F (36.9 C) (!) 97.2 F (36.2 C) 99.7 F (37.6 C)  TempSrc: Core Core Core   SpO2: 98% 99% 100% 100%  Weight:      Height:       Body mass index is 27.99 kg/m.   Physical Exam Vitals reviewed.  HENT:     Mouth/Throat:     Mouth: Mucous membranes are moist.     Pharynx: Oropharynx is clear.  Eyes:     General: No scleral icterus.    Pupils: Pupils are equal, round, and reactive to light.  Cardiovascular:     Rate and Rhythm: Normal rate and regular rhythm.     Heart sounds: No murmur.  Pulmonary:     Effort:  Pulmonary effort is normal.     Breath sounds: Normal breath sounds.  Neurological:     Mental Status: He is alert.     Lab Results Lab Results  Component Value Date   WBC 12.0 (H) 04/17/2020   HGB 7.0 (L) 04/17/2020   HCT 20.9 (L) 04/17/2020   MCV 88.9 04/17/2020   PLT 160 04/17/2020    Lab Results  Component Value Date   CREATININE 1.13 04/17/2020   BUN 16 04/17/2020   NA 139 04/17/2020   K 4.2 04/17/2020   CL 107 04/17/2020   CO2 25 04/17/2020    Lab Results  Component Value Date   ALT 68 (H) 04/15/2020   AST 37 04/15/2020   ALKPHOS 92 04/15/2020   BILITOT 0.6 04/15/2020     Microbiology: Recent Results (from the past 240 hour(s))  Stat Gram stain     Status: None   Collection Time: 04/08/20  4:01 PM   Specimen: Body Fluid  Result Value Ref Range Status   Specimen Description FLUID SYNOVIAL LEFT KNEE  Final   Special Requests NONE  Final   Gram Stain   Final    ABUNDANT WBC PRESENT,BOTH PMN AND MONONUCLEAR NO ORGANISMS SEEN Performed at [facility], [address] [place], [place] [address]    Report Status 04/08/2020 FINAL  Final  Body fluid culture     Status: None   Collection Time: 04/08/20  4:01 PM   Specimen: Body Fluid  Result Value Ref Range Status   Specimen Description FLUID SYNOVIAL LEFT KNEE  Final   Special Requests NONE  Final   Gram Stain   Final    ABUNDANT WBC PRESENT, PREDOMINANTLY PMN NO ORGANISMS SEEN    Culture   Final    NO GROWTH 3 DAYS Performed at [facility], [address] [place], [place] [address]    Report Status 04/11/2020 FINAL  Final  Culture, Urine     Status: None   Collection Time: 04/11/20 10:45 PM   Specimen: Urine, Clean Catch  Result Value Ref Range Status   Specimen Description URINE, CLEAN CATCH  Final   Special Requests NONE  Final   Culture   Final    NO GROWTH Performed at [facility], [address] [place], [place] [address]    Report Status 04/13/2020 FINAL  Final   Surgical pcr screen     Status: None   Collection Time: 04/15/20  2:39 AM   Specimen: Nasal Mucosa; Nasal Swab  Result Value Ref Range   Status   MRSA, PCR NEGATIVE NEGATIVE Final   Staphylococcus aureus NEGATIVE NEGATIVE Final    Comment: (NOTE) The Xpert SA Assay (FDA approved for NASAL specimens in patients 22 years of age and older), is one component of a comprehensive surveillance program. It is not intended to diagnose infection nor to guide or monitor treatment. Performed at [facility], [address] [place], [place] [address]   Culture, blood (Routine X 2) w Reflex to ID Panel     Status: None (Preliminary result)   Collection Time: 04/15/20 11:59 AM   Specimen: BLOOD LEFT WRIST  Result Value Ref Range Status   Specimen Description BLOOD LEFT WRIST  Final   Special Requests   Final    BOTTLES DRAWN AEROBIC AND ANAEROBIC Blood Culture adequate volume   Culture   Final    NO GROWTH 2 DAYS Performed at [facility], [address] [place], [place] [address]    Report Status PENDING  Incomplete  Culture, blood (Routine X 2) w Reflex to ID Panel     Status: None (Preliminary result)   Collection Time: 04/15/20 12:04 PM   Specimen: BLOOD LEFT HAND  Result Value Ref Range Status   Specimen Description BLOOD LEFT HAND  Final   Special Requests   Final    BOTTLES DRAWN AEROBIC AND ANAEROBIC Blood Culture adequate volume   Culture   Final    NO GROWTH 2 DAYS Performed at [facility], [address] [place], [place] [address]    Report Status PENDING  Incomplete  Aerobic/Anaerobic Culture (surgical/deep wound)     Status: None (Preliminary result)   Collection Time: 04/16/20  1:08 PM   Specimen: Aortic Valve Tissue  Result Value Ref Range Status   Specimen Description AORTIC VALVE TISSUE  Final   Special Requests AORTIC FELT SPECIMEN A  Final   Gram Stain   Final    RARE WBC PRESENT,BOTH PMN AND MONONUCLEAR NO ORGANISMS SEEN    Culture   Final     NO GROWTH < 24 HOURS Performed at [facility], [address] [place], [place] [address]    Report Status PENDING  Incomplete  Aerobic/Anaerobic Culture (surgical/deep wound)     Status: None (Preliminary result)   Collection Time: 04/16/20  1:08 PM   Specimen: Aortic Valve Tissue  Result Value Ref Range Status   Specimen Description AORTIC VALVE TISSUE  Final   Special Requests SPECIMEN B  Final   Gram Stain   Final    FEW WBC PRESENT, PREDOMINANTLY PMN NO ORGANISMS SEEN    Culture   Final    NO GROWTH < 24 HOURS Performed at [facility], [address] [place], [place] [address]    Report Status PENDING  Incomplete     Stephanie Dixon, MSN, NP-C [facility]  [email or web address] Pager: [phone] Office: [phone] [facility] Main Line: [phone]  

## 2020-04-17 NOTE — Progress Notes (Signed)
1 Day Post-Op Procedure(s) (LRB): REDO STERNOTOMY (N/A) ASCENDING AORTIC ROOT REPLACEMENT - HOMOGRAFT using [employer] [facility] 23 MM Cryo Aortic Valve. (N/A) TRANSESOPHAGEAL ECHOCARDIOGRAM (TEE) (N/A) Subjective: Extubated last night. No specific complaints this am. Reportedly had a lot of back and hip pain with sitting up this am to weigh and could not stand.  Objective: Vital signs in last 24 hours: Temp:  [96.3 F (35.7 C)-98.1 F (36.7 C)] 97.5 F (36.4 C) (05/28 0730) Pulse Rate:  [68-92] 89 (05/28 0730) Cardiac Rhythm: Atrial paced (05/28 0000) Resp:  [10-28] 11 (05/28 0730) BP: (99-143)/(56-74) 143/74 (05/27 2240) SpO2:  [94 %-100 %] 94 % (05/28 0730) Arterial Line BP: (88-143)/(50-74) 99/59 (05/28 0730) FiO2 (%):  [40 %-50 %] 40 % (05/27 2224) Weight:  [88.5 kg] 88.5 kg (05/28 0500)  Hemodynamic parameters for last 24 hours: PAP: (23-38)/(10-25) 28/15 CO:  [5.1 L/min-6.1 L/min] 6.1 L/min CI:  [2.6 L/min/m2-3.1 L/min/m2] 3.1 L/min/m2  Intake/Output from previous day: 05/27 0701 - 05/28 0700 In: 7488.4 [I.V.:3962.8; Blood:2040; IV Piggyback:1485.6] Out: 4346 [Urine:3090; Blood:946; Chest Tube:310] Intake/Output this shift: No intake/output data recorded.  General appearance: alert and cooperative Neurologic: intact Heart: regular rate and rhythm, S1, S2 normal, no murmur, click, rub or gallop Lungs: clear to auscultation bilaterally Extremities: edema mild Wound: dressings dry  Lab Results: Recent Labs    04/16/20 1939 04/16/20 1940 04/16/20 2343 04/17/20 0200  WBC 15.8*  --   --  12.0*  HGB 7.2*   < > 6.8* 7.0*  HCT 22.2*   < > 20.0* 20.9*  PLT 191  --   --  160   < > = values in this interval not displayed.   BMET:  Recent Labs    04/15/20 1012 04/16/20 1940 04/16/20 2343 04/17/20 0200  NA 136   < > 140 139  K 4.2   < > 4.3 4.2  CL 99  --   --  107  CO2 25  --   --  25  GLUCOSE 121*  --   --  177*  BUN 17  --   --  16  CREATININE 1.45*  --   --   1.13  CALCIUM 8.6*  --   --  7.9*   < > = values in this interval not displayed.    PT/INR:  Recent Labs    04/16/20 1939  LABPROT 11.3*  INR 0.9   ABG    Component Value Date/Time   PHART 7.409 04/16/2020 2343   HCO3 25.0 04/16/2020 2343   TCO2 26 04/16/2020 2343   O2SAT 96.0 04/16/2020 2343   CBG (last 3)  Recent Labs    04/17/20 0450 04/17/20 0610 04/17/20 0731  GLUCAP 147* 160* 143*   CXR: ok  ECG: atrial paced  Assessment/Plan: S/P Procedure(s) (LRB): REDO STERNOTOMY (N/A) ASCENDING AORTIC ROOT REPLACEMENT - HOMOGRAFT using [employer] 23 MM Cryo Aortic Valve. (N/A) TRANSESOPHAGEAL ECHOCARDIOGRAM (TEE) (N/A)  POD 1  Hemodynamically stable. Levophed off and on neo 15. Wean off milrinone. Decrease dopamine to 2 for renal effect given basline stage 3 CKD.  Preop atrial fib on amio. Not cardioverted preop. Postop he was in sinus. This am he is junctional 30's. Will continue atrial pacing for now. Decrease amio to 200 bid. No beta blocker. Hopefully sinus node function will return over next few days.  Stage 3 CKD: creat up to 6.5 on admission with sepsis but decreased to 1.45 preop. Follow.  Acute postop blood loss anemia on top  of baseline preop anemia of chronic disease. Hgb is 7.0 this am so will give him two units. Start iron.  Volume excess: start diuresis today.  Keep chest tubes in today and remove tomorrow.  DC arterial lines and swan.  Spinal osteo/discitis with small psoas abscesses and probable myositis in paraspinous muscles related to enterococcal bacteremia. He had persistent severe pain preop due to this and may need to be reimaged if it does not improve. [name] is his neurosurgeon.  Continue Rocephin and IV ampicillin for enterococcal sepsis and endocarditis per ID. Follow up on cultures of valve, aorta.  PT eval    LOS: 4 days    Johnathan Murray 04/17/2020

## 2020-04-17 NOTE — Discharge Summary (Addendum)
Physician Discharge Summary       [address]       [place] [address]             [phone]    Patient ID: Johnathan Murray MRN: [id number] DOB/AGE: [birth year] 69 y.o.  Admit date: 04/13/2020 Discharge date: 04/30/2020  Admission Diagnoses: 1.Disseminated bacteremia due to Enterococcus 2. 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 Periaortic pseudoaneurysm  Discharge Diagnoses:  1. S/p Redo median sternotomy, right axillary artery graft for cardiopulmonary bypass, extracorporeal circulation, debridement of aortic root abscess and pseudoaneurysm, and aortic root replacement using a 23 mm Homograft aortic root with reimplantation of coronary arteries. 2. Expected post op anemia 3. Septic arthritis ([place]) 4. Discitis 5. Acute renal insufficiency-etiology multifactorial 6. History of Diabetes mellitus due to underlying condition with unspecified complications ([place]) 7. History of Dyslipidemia 8. History of skin cancer (left wrist)   9. History of acute suppurative otitis media of left ear without spontaneous rupture of tympanic membrane 10. History of cervical myelopathy ([place]) 11. History of coronary artery disease of native artery of native heart with stable angina pectoris ([place]) 12. History of essential hypertension 13. History of Lumbar stenosis with neurogenic claudication 14. History of Intermittent claudication (HCC) 15. History of Hypothyroidism 16. History of PAF (paroxysmal atrial fibrillation) ([place]) 17. History of macular degeneration 18. History of pseudogout of left knee   Consults: cardiology, pulmonary/intensive care, ID, neurology, orthopedic surgery and radiology  Procedure (s):  1. Redo median Sternotomy 2. Right axillary artery graft for cardiopulmonary bypass 3. Extracorporeal circulation 4. Debridement of aortic root abscess and pseudoaneurysm 5.   Aortic root replacement using a 23 mm Homograft aortic root  with reimplantation of coronary arteries by Dr. Cyndia Bent on 04/16/2020.  History of Presenting Illness: Patient is a 69 year old male with a complex medical history including previous aortic valve replacement in 2017. He presented on 03/19/2020 with low-grade fevers and weakness in the lower extremities. He underwent extensive diagnostic evaluation and was placed on broad-spectrum antibiotics. He was found on MRI of the lumbar spine to have acute osteomyelitis discitis at L4-5. Evidence of associated soft tissue infection cellulitis within the psoas musculature bilaterally and a few small superimposed abscesses. Neurosurgery was consulted and he underwent aspiration of the disc space. Initial TEE showed no obvious vegetation. Orthopedic services were obtained by Dr. Mardelle Matte due to bilateral knee pain and swelling and x-ray showed diffuse degenerative changes as well as large joint effusion. Doppler exam showed a ruptured Baker's cyst on the left lower extremity. The patient underwent left lower extremity joint aspiration on 03/21/2020 and right lower extremity joint aspiration on 03/22/2020 both showing intracellular calcium pyrophosphate crystals with white blood cell count up to 15,000 and culture grew rare Enterococcus faecalis. He did improve over time and was transferred to inpatient CIR. He did develop atrial fibrillation with rapid ventricular response and was scheduled for TEE-DCCV on today's date. Unfortunately this revealed an evolved aortic root abscess with vegetations approximately 5 mm x 8 mm on the leaflet of RCC of prosthetic valve. He is still having intermittent fevers. We are asked to see the patient in cardiothoracic surgical consultation for possible surgical intervention. As previously stated, patient underwent aortic valve replacement with a 23 mm Edwards pericardial valve and coronary bypass graft surgery x3 in September 2017 at [facility] for stage C asymptomatic severe aortic stenosis.   He had a catheterization here in June 2019 which documented occlusion of both  vein grafts at that time with a patent left internal mammary graft to the LAD.  He had a nuclear stress test on in February 2021 in anticipation of spine surgery and this was abnormal showing anterior lateral ischemia.  He subsequently underwent cardiac catheterization on 01/21/2020 which was essentially unchanged from his prior catheterization 2019 with known occlusion of both vein grafts to the diagonal and obtuse marginal and a patent left internal mammary graft to the LAD.  The right coronary was a dominant vessel without significant disease.  The left circumflex had mild to moderate proximal to mid vessel stenosis of 50% at most.  He subsequently underwent C3-4 anterior cervical discectomy by [name] on 03/02/2020.  He reports an uneventful recovery but then developed progressive weakness, fever, malaise, and was admitted with sepsis and acute renal failure with a creatinine of 6.5 compared to his normal baseline of 1.5-1.6.  He was found to have enterococcal bacteremia and blood cultures cleared on 03/23/2020.  He had a TEE performed on 03/26/2020 which was negative.  He subsequently developed swelling in both knees and was found to have septic arthritis requiring bilateral knee washouts.  He was discharged to inpatient rehab on ampicillin but continued to have some fevers.  He began complaining of worsening back pain underwent MRI of the cervical and lumbar spine on 513 and was found to have L4-5 discitis/osteomyelitis with associated soft tissue infection within the psoas musculature bilaterally with small abscesses present.  There is edema throughout the posterior paraspinous musculature felt to possibly be myositis.  He had previous bilateral L4-5 decompressive laminotomies and foraminotomies in July 2020 by [name].  He underwent a repeat TEE today which showed a new small vegetation measuring 0.8 x 0.5 cm on the noncoronary cusp of  the prosthetic valve.  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  The color flow appeared to be in and out of the pseudoaneurysm.  There are no signs of infection on the tricuspid or mitral valve.  Left and right heart function was normal.  He has been hemodynamically stable.  He had a fever to 101.9 last night.  He is afebrile at this time.  White blood cell count this morning was 7.9.  Hemoglobin was 7.5.  Creatinine was 1.43.  Electrocardiogram shows sinus rhythm with no evidence of heart block.  He has been on Eliquis for atrial fibrillation and that was stopped this afternoon.  With development of prosthetic valve vegetation and probable perivalvular abscess I think he will require redo aortic valve replacement and probably aortic root replacement using a homograft. Dr. Cyndia Bent will review his TEE with his colleagues on the heart valve team and decide about the utility of a gated cardiac CTA to better assess the perivalvular area.  His Eliquis was just stopped this afternoon and ideally he should be off of that for 4 to 5 days before proceeding with this type of major cardiac surgery with a high risk of bleeding.  Dr. Cyndia Bent reviewed all of the above with the patient and his wife and answered their questions.  Gated CTA done 05/25 showed a pseudoaneurysm at the base of the aortic valve prosthesis projecting toward the RV outflow tract. He will require aortic root replacement with a homograft. Dr. Cyndia Bent discussed the operative procedure with the patient and his wife including alternatives, benefits and risks; including but not limited  to bleeding, blood transfusion, infection, stroke, myocardial infarction, graft failure, heart block requiring a permanent pacemaker, organ dysfunction, and death.  [name] understands and agrees to proceed. Pre operative carotid duplex US  showed no significant internal carotid artery stenosis bilaterally. He underwent a redo median sternotomy, right axillary artery graft for cardiopulmonary bypass, extracorporal circulation, debridement of aortic root abscess and pseudoaneurysm, and ARR (using a 23 mm Homograft aortic root with reimplantation of coronary arteries) on 04/17/2020.  Brief Hospital Course:  The patient was extubated later the evening of surgery without difficulty. He remained afebrile and hemodynamically stable. He was weaned off Dopamine and Neo Synephrine drips. Gordy Councilman, a line, chest tubes, and foley were removed early in the post operative course. He was initially in sinus rhythm post op. He had atrial fibrillation prior to surgery and he was continued on oral Amiodarone.  He was volume over loaded and diuresed. He had AKI on admission (multifactorial-sepsis, hypotension) and creatinine was slightly elevated post op. He has spinal osteo/discitis with small psoas abscesses and probable myositis in paraspinous muscles related to enterococcal bacteremia. He had persistent severe pain preop due to this and may need to be reimaged if it does not improve. [name] is his neurosurgeon. Ceftriaxone and Ampicillin were continued for He had ABL anemia. He did require a post op transfusion and was put on oral iron. Last H and H was 9.1 and 29.2. He was weaned off the insulin drip.   The patient's glucose remained well controlled.The patient's HGA1C pre op was 6.8. The patient was felt surgically stable for transfer from the ICU to PCTU for further convalescence on 05/29. He continues to progress with cardiac rehab. He was ambulating on room air. He has been tolerating a diet and was constipated. He was not able to ambulate much secondary to his back pain (spinal osteo/discitis with small psoas abscesses and probable myositis in paraspinous muscles related to enterococcal bacteremia). Dr. Cyndia Bent spoke with [name] who felt there was not  much else to do for patient's back pain other than continue antibiotics for now. He was given laxatives and was finally able to have a bowel movement on 06/01. He continued to have bowel movements with laxatives and was feeling much better. Epicardial pacing wires were removed on 05/31. He went back into afib on 6/6/ with RVR. He was started on Amio IV which slowed his rate but didn't convert him. He underwent a cardioversion per cardiology's recommendation on 6/9.  The patient is felt surgically stable for discharge today.   Latest Vital Signs: Blood pressure 111/65, pulse 65, temperature 98.4 F (36.9 C), temperature source Oral, resp. rate 18, height '5\' 10"'$  (1.778 m), weight 80.6 kg, SpO2 98 %.  Physical Exam:  General appearance: alert, cooperative and no distress Heart: regular rate and rhythm, S1, S2 normal, no murmur, click, rub or gallop Lungs: clear to auscultation bilaterally Abdomen: soft, non-tender; bowel sounds normal; no masses,  no organomegaly Extremities: extremities normal, atraumatic, no cyanosis or edema and left knee swelling Wound: clean and dry sternal incision  Discharge Condition:Stable and discharged to CIR  Recent laboratory studies:  Lab Results  Component Value Date   WBC 7.8 04/29/2020   HGB 8.3 (L) 04/29/2020   HCT 27.1 (L) 04/29/2020   MCV 95.4 04/29/2020   PLT 328 04/29/2020   Lab Results  Component Value Date   NA 137 04/29/2020   K 3.7 04/29/2020   CL 102 04/29/2020   CO2  25 04/29/2020   CREATININE 1.28 (H) 04/29/2020   GLUCOSE 121 (H) 04/29/2020      Diagnostic Studies: DG Chest 2 View  Result Date: 04/26/2020 CLINICAL DATA:  Endocarditis. EXAM: CHEST - 2 VIEW COMPARISON:  04/18/2020 FINDINGS: Patient has RIGHT-sided PICC line, tip overlying the superior vena cava. Status post median sternotomy. The heart is enlarged and stable in configuration. Stable prominence of the RIGHT first costochondral junction. No focal consolidations or pulmonary  edema. Small bilateral pleural effusions are present. IMPRESSION: Stable cardiomegaly. Small effusions. Electronically Signed   By: Nolon Nations M.D.   On: 04/26/2020 08:22   MR LUMBAR SPINE WO CONTRAST  Result Date: 04/02/2020 CLINICAL DATA:  For comparison initial evaluation for acute low back pain, recent surgery, infection suspected. EXAM: MRI LUMBAR SPINE WITHOUT CONTRAST TECHNIQUE: Multiplanar, multisequence MR imaging of the lumbar spine was performed. No intravenous contrast was administered. COMPARISON:  None. FINDINGS: Segmentation: Standard. Lowest well-formed disc space labeled the L5-S1 level. Alignment: Physiologic with preservation of the normal lumbar lordosis. No listhesis. Vertebrae: Abnormal T2/STIR signal intensity seen throughout the L4-5 interspace with associated progressive disc space height loss as compared to previous. Associated abnormal marrow edema with mild endplate irregularity within the adjacent L4 and L5 vertebral bodies. Findings consistent with acute osteomyelitis discitis. Irregular heterogeneous T1/T2 signal intensity seen within the ventral epidural space at this level could reflect postoperative changes and/or epidural phlegmon/abscess (series 2, image 8). Mild reactive marrow edema with small joint effusion seen about the left greater than right L4-5 facets as well, which could also be related to infection and/or arthritic changes. No other evidence for acute infection within the lumbar spine. No evidence for acute or chronic fracture. Underlying bone marrow signal intensity mildly heterogeneous but within normal limits. No worrisome osseous lesions. Conus medullaris and cauda equina: Conus extends to the L1 level. Conus and cauda equina appear normal. Paraspinal and other soft tissues: Soft tissue edema seen throughout the lower posterior paraspinous soft tissues, which could be related to postoperative changes and/or infection/myositis. Abnormal edema seen within  the psoas musculature bilaterally as well, likely related to infection. Few small superimposed soft tissue collections present within both psoas muscles, largest of which measures approximately 11 mm on the right (series 5, image 29), consistent with small abscesses. Few small cyst noted within the kidneys bilaterally. Visualized visceral structures otherwise unremarkable. Disc levels: L1-2: Negative interspace. Mild facet hypertrophy. No canal or foraminal stenosis. L2-3: Mild diffuse disc bulge with disc desiccation. Disc bulge asymmetric to the right with associated mild reactive endplate changes. Moderate facet and ligament flavum hypertrophy. Resultant mild spinal stenosis. Foramina remain patent. L3-4: Mild disc bulge with disc desiccation. Superimposed broad right extraforaminal disc protrusion (series 5, image 19). Moderate facet and ligament flavum hypertrophy. Resultant moderate canal with bilateral subarticular stenosis. Mild bilateral L3 foraminal narrowing. L4-5: Findings concerning for osteomyelitis discitis as above. Sequelae of recent posterior decompression. Residual left greater than right facet degeneration. Persistent moderate spinal stenosis, improved as compared to preoperative exam. Moderate right with severe left L4 foraminal narrowing. L5-S1: Diffuse disc bulge with disc desiccation and mild intervertebral disc space narrowing. Mild reactive endplate changes. Moderate facet and ligament flavum hypertrophy. Resultant moderate bilateral subarticular stenosis. Moderate right worse than left L5 foraminal narrowing. IMPRESSION: 1. Findings consistent with acute osteomyelitis discitis at L4-5. Heterogeneous signal intensity within the adjacent ventral epidural space could reflect epidural abscess and/or phlegmon or possibly disc material. Sequelae of recent posterior decompression at this level with  persistent moderate spinal stenosis. 2. Evidence for associated soft tissue infection/cellulitis  within the psoas musculature bilaterally with a few small superimposed abscesses as above. Edema throughout the posterior paraspinous musculature could reflect postoperative changes and/or myositis/infection. 3. Additional multilevel degenerative spondylosis as above, most pronounced at L3-4 where there is resultant moderate spinal stenosis, otherwise stable. Electronically Signed   By: Jeannine Boga M.D.   On: 04/02/2020 18:56   CT CORONARY MORPH W/CTA COR W/SCORE W/CA W/CM &/OR WO/CM  Addendum Date: 04/14/2020   ADDENDUM REPORT: 04/14/2020 20:07 CLINICAL DATA:  Endocarditis/Aortic root abscess/Prosthetic aortic valve. EXAM: Cardiac TAVR CT TECHNIQUE: The patient was scanned on a [employer]. A 120 kV retrospective scan was triggered in the descending thoracic aorta at 111 HU's. Gantry rotation speed was 250 msecs and collimation was .6 mm. No beta blockade or nitro were given. The 3D data set was reconstructed in 5% intervals of the R-R cycle. Systolic and diastolic phases were analyzed on a dedicated work station using MPR, MIP and VRT modes. The patient received 80 cc of contrast. FINDINGS: Image quality: Average. Noise artifact is: There is cardiac motion (PVC) and respiratory artifact. Aortic Valve: There is a 23 mm Magna ease bioprosthetic valve present in the aortic position. The leaflet of the LCC appears thickened with likely vegetation. There is an aortic root abscess with a large pseudoaneurysm that measures 30 mm x 10 mm located off the [place] of the aortic root with extension anteriorly in front of the RCC. There is a fistulous connection between the pseudoaneurysm and the LVOT anterior to the RCC of the aortic root. Coronary Arteries: Normal coronary origin. Right dominance. CAC score not performed due to prior CABG. Please refer to recent cardiac cath for definitive assessment of coronary arteries and bypass grafts. Bypass Grafts: A patent LIMA to mid LAD is seen. No other vein  grafts observed. At least one is occluded, likely 2, on the anterior aspect of the aorta. Left main: The left main is a large caliber vessel with a normal take off from the left coronary cusp that bifurcates to form a left anterior descending artery and a left circumflex artery. There is minimal calcified plaque (<25%). Left anterior descending artery: The LAD is 100% occluded proximally. Left circumflex artery: The LCX is non-dominant. The proximal LCX contains mild calcified plaque (25-49%). Distal evaluation is limited by artifact. Right coronary artery: The RCA is dominant with normal take off from the right coronary cusp. There is mild to moderate heavily calcified plaque throughout. Vessel severity is unable to be determined. The RCA terminates as PDA and PLV branches with heavily calcified plaque. Cardiac Morphology: Right Atrium: Right atrial size is within normal limits. Right Ventricle: The right ventricular cavity is within normal limits. Left Atrium: Left atrial size is normal in size with no left atrial appendage filling defect. Left Ventricle: The ventricular cavity size is within normal limits. Pulmonary arteries: Normal in size without proximal filling defect. Pulmonary veins: Normal pulmonary venous drainage. Pericardium: Normal thickness with no significant effusion or calcium present. Mitral Valve: The mitral valve is normal structure without significant calcification. Extra-cardiac findings: See attached radiology report for non-cardiac structures. IMPRESSION: 1. Prosthetic valve (23 mm Magna ease) endocarditis with leaflet thickening concerning for vegetation. 2. 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 3. Fistulous connection between the pseudoaneurysm and LVOT described above. 3. 3-vessel CAD with patent LIMA-LAD. [place] T. Audie Box, MD Electronically Signed  ByEleonore Chiquito   On: 04/14/2020 20:07   Result Date: 04/14/2020 EXAM:  OVER-READ INTERPRETATION  CT CHEST The following report is an over-read performed by radiologist Dr. Vinnie Langton of [facility], [place] on 04/14/2020. This over-read does not include interpretation of cardiac or coronary anatomy or pathology. The coronary calcium score/coronary CTA interpretation by the cardiologist is attached. COMPARISON:  None. FINDINGS: Within the visualized portions of the thorax there are no suspicious appearing pulmonary nodules or masses, there is no acute consolidative airspace disease, no pleural effusions, no pneumothorax and no lymphadenopathy. Visualized portions of the upper abdomen are unremarkable. There are no aggressive appearing lytic or blastic lesions noted in the visualized portions of the skeleton. Status post median sternotomy for aortic valve replacement. IMPRESSION: No significant incidental noncardiac findings are noted. Electronically Signed: By: Vinnie Langton M.D. On: 04/14/2020 13:26   DG Chest Port 1 View  Result Date: 04/18/2020 CLINICAL DATA:  Status post aortic valve repair EXAM: PORTABLE CHEST 1 VIEW COMPARISON:  04/17/2020 FINDINGS: Swan-Ganz catheter has been removed in the interval. Right jugular sheath remains in place. Cardiac shadow is stable. Pericardial drain and mediastinal drain are again seen and stable. The lungs are well aerated without pneumothorax. No focal infiltrate is seen. No bony abnormality is noted. IMPRESSION: Tubes and lines as described above. No acute infiltrate is seen. Electronically Signed   By: Inez Catalina M.D.   On: 04/18/2020 09:19   DG Chest Port 1 View  Result Date: 04/17/2020 CLINICAL DATA:  Status post aortic valve repair EXAM: PORTABLE CHEST 1 VIEW COMPARISON:  04/16/2020 FINDINGS: Endotracheal tube and gastric catheter have been removed in the interval. Mediastinal drain and pericardial drain remain in place as does a Swan-Ganz catheter. Lungs are hypoinflated with mild basilar atelectasis. No pneumothorax  is seen. No bony abnormality is noted. IMPRESSION: Tubes and lines as described above. Poor inspiratory effort with mild bibasilar atelectasis. Electronically Signed   By: Inez Catalina M.D.   On: 04/17/2020 08:46   DG Chest Port 1 View  Result Date: 04/16/2020 CLINICAL DATA:  Status post aortic valve repair. EXAM: PORTABLE CHEST 1 VIEW COMPARISON:  Apr 16, 2020. FINDINGS: Stable cardiomegaly. Endotracheal and nasogastric tubes are in grossly good position. Right internal jugular catheter is noted with distal tip in expected position of main pulmonary artery. No pneumothorax or significant pleural effusion is noted. Minimal bibasilar subsegmental atelectasis is noted. Bony thorax is unremarkable. IMPRESSION: Minimal bibasilar subsegmental atelectasis. Endotracheal and nasogastric tubes are in grossly good position. No pneumothorax is noted. Electronically Signed   By: Marijo Conception M.D.   On: 04/16/2020 19:58   DG CHEST PORT 1 VIEW  Result Date: 04/16/2020 CLINICAL DATA:  Preop. EXAM: PORTABLE CHEST 1 VIEW COMPARISON:  Radiograph 03/20/2020 FINDINGS: Post median sternotomy. Prosthetic aortic valve. Mild cardiomegaly with normal mediastinal contours. No pulmonary edema, focal airspace disease, pleural effusion or pneumothorax. No acute osseous abnormalities are seen. IMPRESSION: Mild cardiomegaly without acute abnormality. Electronically Signed   By: Keith Rake M.D.   On: 04/16/2020 03:42   ECHO TEE  Result Date: 04/29/2020    TRANSESOPHOGEAL ECHO REPORT   Patient Name:   BURTON GAHAN Plamondon Date of Exam: 04/29/2020 Medical Rec #:  [id number]           Height:       70.0 in Accession #:    [id number]          Weight:       177.7  lb Date of Birth:  17-Mar-1951           BSA:          1.985 m Patient Age:    29 years            BP:           76/37 mmHg Patient Gender: M                   HR:           68 bpm. Exam Location:  Inpatient Procedure: Transesophageal Echo, Cardiac Doppler and Color Doppler  Indications:     Atrial fibrillation 427.31 / I48.91  History:         Patient has prior history of Echocardiogram examinations, most                  recent 04/16/2020. CAD, Aortic Valve Disease, Arrythmias:Atrial                  Fibrillation and Atrial Flutter, Signs/Symptoms:Bacteremia;                  Risk Factors:Hypertension, Dyslipidemia and Non-Smoker.                  Aortic Valve: 23 mm Homograft valve/aortic root valve is                  present in the aortic position. Procedure Date: 04/16/2020.  Sonographer:     Vickie Epley RDCS Referring Phys:  [id number] Gaye Pollack Diagnosing Phys: Skeet Latch MD PROCEDURE: After discussion of the risks and benefits of a TEE, an informed consent was obtained from the patient. The transesophogeal probe was passed without difficulty through the esophogus of the patient. Sedation performed by different physician. The patient was monitored while under deep sedation. Anesthestetic sedation was provided intravenously by Anesthesiology: 317.'97mg'$  of Propofol. Image quality was excellent. The patient's vital signs; including heart rate, blood pressure, and oxygen saturation; remained stable throughout the procedure. The patient developed no complications during the procedure. A successful direct current cardioversion was performed at 200 joules with 1 attempt. IMPRESSIONS  1. Left ventricular ejection fraction, by estimation, is 55 to 60%. The left ventricle has normal function. The left ventricle has no regional wall motion abnormalities.  2. Right ventricular systolic function is mildly reduced. The right ventricular size is normal.  3. No left atrial/left atrial appendage thrombus was detected. The LAA emptying velocity was 50 cm/s.  4. Multiple regurgitant jets each with mild regurgitation. The mitral valve is normal in structure. Mild to moderate mitral valve regurgitation. No evidence of mitral stenosis.  5. Tricuspid valve regurgitation is mild to moderate.  6. The  aortic valve has been repaired/replaced. Aortic valve regurgitation is trivial. No aortic stenosis is present. There is a 23 mm Homograft valve/aortic root valve present in the aortic position. Procedure Date: 04/16/2020. Echo findings are consistent with normal structure and function of the aortic valve prosthesis.  7. The inferior vena cava is normal in size with greater than 50% respiratory variability, suggesting right atrial pressure of 3 mmHg. Conclusion(s)/Recommendation(s): Normal biventricular function without evidence of hemodynamically significant valvular heart disease. FINDINGS  Left Ventricle: Left ventricular ejection fraction, by estimation, is 55 to 60%. The left ventricle has normal function. The left ventricle has no regional wall motion abnormalities. The left ventricular internal cavity size was normal in size. There is  no left ventricular hypertrophy. Right Ventricle:  The right ventricular size is normal. No increase in right ventricular wall thickness. Right ventricular systolic function is mildly reduced. Left Atrium: Left atrial size was normal in size. No left atrial/left atrial appendage thrombus was detected. The LAA emptying velocity was 50 cm/s. Right Atrium: Right atrial size was normal in size. Pericardium: There is no evidence of pericardial effusion. Mitral Valve: Multiple regurgitant jets each with mild regurgitation. The mitral valve is normal in structure. Normal mobility of the mitral valve leaflets. Mild to moderate mitral valve regurgitation. No evidence of mitral valve stenosis. Tricuspid Valve: The tricuspid valve is normal in structure. Tricuspid valve regurgitation is mild to moderate. No evidence of tricuspid stenosis. Aortic Valve: No perivalvular regurgitation or abscess. The aortic valve has been repaired/replaced. Aortic valve regurgitation is trivial. No aortic stenosis is present. There is a 23 mm Homograft valve/aortic root valve present in the aortic position.  Procedure Date: 04/16/2020. Echo findings are consistent with normal structure and function of the aortic valve prosthesis. Pulmonic Valve: The pulmonic valve was normal in structure. Pulmonic valve regurgitation is trivial. No evidence of pulmonic stenosis. Aorta: The aortic root is normal in size and structure. There is minimal (Grade I) plaque involving the descending aorta. Venous: The inferior vena cava is normal in size with greater than 50% respiratory variability, suggesting right atrial pressure of 3 mmHg. IAS/Shunts: No atrial level shunt detected by color flow Doppler. There is no evidence of a patent foramen ovale. There is no evidence of an atrial septal defect.  MR Peak grad:    75.3 mmHg   TRICUSPID VALVE MR Mean grad:    42.0 mmHg   TR Peak grad:   18.1 mmHg MR Vmax:         434.00 cm/s TR Vmax:        213.00 cm/s MR Vmean:        296.0 cm/s MR PISA:         1.01 cm MR PISA Eff ROA: 9 mm MR PISA Radius:  0.40 cm Skeet Latch MD Electronically signed by Skeet Latch MD Signature Date/Time: 04/29/2020/1:20:20 PM    Final    ECHO TEE  Result Date: 04/13/2020    TRANSESOPHOGEAL ECHO REPORT   Patient Name:   AARYA QUEBEDEAUX Lessley Date of Exam: 04/13/2020 Medical Rec #:  [id number]           Height:       70.0 in Accession #:    [id number]          Weight:       170.6 lb Date of Birth:  [birth year]           BSA:          1.951 m Patient Age:    12 years            BP:           105/77 mmHg Patient Gender: M                   HR:           108 bpm. Exam Location:  Inpatient Procedure: Transesophageal Echo, 3D Echo, Color Doppler and Cardiac Doppler Indications:     I48.91* Unspecified atrial fibrillation  History:         Patient has prior history of Echocardiogram examinations, most                  recent 03/26/2020. CAD, Arrythmias:Atrial Fibrillation; Risk  Factors:Diabetes, Dyslipidemia and Hypertension. 17m Edwards                  Sapien Bioprosthetic Aortic Valve implanted  08/16/16.  Sonographer:     ERaquel SarnaSenior RDCS Referring Phys:  [id number] LinDUKE Diagnosing Phys: WEleonore ChiquitoMD PROCEDURE: After discussion of the risks and benefits of a TEE, an informed consent was obtained from the patient. TEE procedure time was 19 minutes. The transesophogeal probe was passed without difficulty through the esophogus of the patient. Local oropharyngeal anesthetic was provided with viscous lidocaine. Sedation performed by different physician. The patient was monitored while under deep sedation. Anesthestetic sedation was provided intravenously by Anesthesiology: '260mg'$  of Propofol, '50mg'$  of Lidocaine. Image quality was excellent. The patient's vital signs; including heart rate, blood pressure, and oxygen saturation; remained stable throughout the procedure. The patient developed no complications during the procedure. A successful direct current cardioversion was performed at 200 joules with 1 attempt. IMPRESSIONS  1. There is a small vegetation attached to the leaflet of the NFriendshipthat measures 0.8 cm x 0.5 cm. There is a pseudoaneurysm located in the anterior aspect of the aortic root, which is actually best seen on the supplemental TTE images. There is no fistulous connection with the LVOT and no apparent regurgitation. The color flow appears to be in and out of the pseudoaneurysm. 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 A cardiac CT is recommended to better characterize the location and extent of the pseudoaneurysm. The aortic valve has been repaired/replaced. Aortic valve regurgitation is not visualized. Aortic valve mean gradient measures 13.0 mmHg. Aortic valve Vmax measures 2.32 m/s.  2. Left ventricular ejection fraction, by estimation, is 50 to 55%. The left ventricle has low normal function. The left ventricle has no regional wall motion abnormalities.  3. Right ventricular systolic function is normal. The right  ventricular size is normal.  4. No left atrial/left atrial appendage thrombus was detected. The LAA emptying velocity was 56 cm/s.  5. The mitral valve is grossly normal. Mild mitral valve regurgitation. No evidence of mitral stenosis.  6. There is a pseudoaneurysm of the aortic root anterior to the RCC with clear color flow into the affected area. Aortic root abscess present. There is mild (Grade II) layered plaque involving the descending aorta. FINDINGS  Left Ventricle: Left ventricular ejection fraction, by estimation, is 50 to 55%. The left ventricle has low normal function. The left ventricle has no regional wall motion abnormalities. The left ventricular internal cavity size was normal in size. There is no left ventricular hypertrophy. Abnormal (paradoxical) septal motion consistent with post-operative status. Right Ventricle: The right ventricular size is normal. No increase in right ventricular wall thickness. Right ventricular systolic function is normal. Left Atrium: Left atrial size was normal in size. No left atrial/left atrial appendage thrombus was detected. The LAA emptying velocity was 56 cm/s. Right Atrium: Right atrial size was normal in size. Pericardium: Trivial pericardial effusion is present. Mitral Valve: The mitral valve is grossly normal. There is mild thickening of the mitral valve leaflet(s). Mild mitral valve regurgitation. No evidence of mitral valve stenosis. There is no evidence of mitral valve vegetation. Tricuspid Valve: The tricuspid valve is grossly normal. Tricuspid valve regurgitation is trivial. No evidence of tricuspid stenosis. There is no evidence of tricuspid valve vegetation. Aortic Valve: There is a small vegetation attached to the leaflet of the NChest Springsthat measures 0.8 cm x 0.5 cm. There is a  pseudoaneurysm located in the anterior aspect of the aortic root, which is actually best seen on the supplemental TTE images. There is no fistulous connection with the LVOT and no  apparent regurgitation. The color flow appears to be in and out of the pseudoaneurysm. 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 A cardiac CT is recommended to better characterize the location and extent of the pseudoaneurysm. The aortic valve has been repaired/replaced. Aortic valve regurgitation is not visualized. Aortic valve mean gradient measures 13.0 mmHg. Aortic valve peak gradient measures 21.5 mmHg. Aortic valve area, by VTI measures 1.79 cm. There is a 23 mm bioprosthetic valve present in the aortic position. Pulmonic Valve: The pulmonic valve was grossly normal. Pulmonic valve regurgitation is not visualized. No evidence of pulmonic stenosis. Aorta: There is a pseudoaneurysm of the aortic root anterior to the RCC with clear color flow into the affected area. Root abscess present. There is mild (Grade II) layered plaque involving the descending aorta. IAS/Shunts: No atrial level shunt detected by color flow Doppler.  LEFT VENTRICLE PLAX 2D LVOT diam:     2.10 cm LV SV:         62 LV SV Index:   32 LVOT Area:     3.46 cm  AORTIC VALVE AV Area (VTI):     1.79 cm AV Vmax:           232.08 cm/s AV Vmean:          172.165 cm/s AV VTI:            0.349 m AV Peak Grad:      21.5 mmHg AV Mean Grad:      13.0 mmHg LVOT VTI:          0.180 m LVOT/AV VTI ratio: 0.52  AORTA Ao Asc diam: 3.20 cm TRICUSPID VALVE TR Peak grad:   17.5 mmHg TR Vmax:        209.00 cm/s  SHUNTS Systemic VTI:  0.18 m Systemic Diam: 2.10 cm Eleonore Chiquito MD Electronically signed by Eleonore Chiquito MD Signature Date/Time: 04/13/2020/2:40:09 PM    Final    VAS US DOPPLER PRE CABG  Result Date: 04/15/2020 PREOPERATIVE VASCULAR EVALUATION  Indications:      Pre-CABG. Risk Factors:     Diabetes. Comparison Study: No prior studies. Performing Technologist: Carlos Levering Rvt  Examination Guidelines: A complete evaluation includes B-mode imaging, spectral Doppler, color Doppler, and power  Doppler as needed of all accessible portions of each vessel. Bilateral testing is considered an integral part of a complete examination. Limited examinations for reoccurring indications may be performed as noted.  Right Carotid Findings: +----------+--------+--------+--------+-----------------------+--------+           PSV cm/sEDV cm/sStenosisDescribe               Comments +----------+--------+--------+--------+-----------------------+--------+ CCA Prox  87      17              smooth and heterogenous         +----------+--------+--------+--------+-----------------------+--------+ CCA Distal72      20              smooth and heterogenous         +----------+--------+--------+--------+-----------------------+--------+ ICA Prox  60      14              calcific                        +----------+--------+--------+--------+-----------------------+--------+  ICA Distal75      28                                              +----------+--------+--------+--------+-----------------------+--------+ ECA       89      14                                              +----------+--------+--------+--------+-----------------------+--------+ Portions of this table do not appear on this page. +----------+--------+-------+--------+------------+           PSV cm/sEDV cmsDescribeArm Pressure +----------+--------+-------+--------+------------+ [id number]                                  +----------+--------+-------+--------+------------+ +---------+--------+--+--------+-+---------+ VertebralPSV cm/s29EDV cm/s7Antegrade +---------+--------+--+--------+-+---------+ Left Carotid Findings: +----------+--------+--------+--------+-----------------------+--------+           PSV cm/sEDV cm/sStenosisDescribe               Comments +----------+--------+--------+--------+-----------------------+--------+ CCA Prox  89      15              smooth and heterogenous          +----------+--------+--------+--------+-----------------------+--------+ CCA Distal80      27              calcific                        +----------+--------+--------+--------+-----------------------+--------+ ICA Prox  71      24              calcific                        +----------+--------+--------+--------+-----------------------+--------+ ICA Distal85      34                                              +----------+--------+--------+--------+-----------------------+--------+ ECA       78      9                                               +----------+--------+--------+--------+-----------------------+--------+ +----------+--------+--------+--------+------------+ SubclavianPSV cm/sEDV cm/sDescribeArm Pressure +----------+--------+--------+--------+------------+           94                                   +----------+--------+--------+--------+------------+ +---------+--------+--+--------+--+---------+ VertebralPSV cm/s52EDV cm/s23Antegrade +---------+--------+--+--------+--+---------+  ABI Findings: +--------+------------------+-----+---------+--------------+ Right   Rt Pressure (mmHg)IndexWaveform Comment        +--------+------------------+-----+---------+--------------+ Brachial                                Restricted arm +--------+------------------+-----+---------+--------------+ PTA     163               1.24 triphasic               +--------+------------------+-----+---------+--------------+  DP      147               1.12 triphasic               +--------+------------------+-----+---------+--------------+ +--------+------------------+-----+---------+-------+ Left    Lt Pressure (mmHg)IndexWaveform Comment +--------+------------------+-----+---------+-------+ [id number]                    triphasic        +--------+------------------+-----+---------+-------+ PTA     156               1.19 triphasic         +--------+------------------+-----+---------+-------+ DP      138               1.05 triphasic        +--------+------------------+-----+---------+-------+ +-------+---------------+----------------+ ABI/TBIToday's ABI/TBIPrevious ABI/TBI +-------+---------------+----------------+ Right  1.24                            +-------+---------------+----------------+ Left   1.19                            +-------+---------------+----------------+  Right Doppler Findings: +--------+--------+-----+-------+--------------+ Site    PressureIndexDopplerComments       +--------+--------+-----+-------+--------------+ Brachial                    Restricted arm +--------+--------+-----+-------+--------------+  Left Doppler Findings: +--------+--------+-----+---------+--------+ Site    PressureIndexDoppler  Comments +--------+--------+-----+---------+--------+ [id number]          triphasic         +--------+--------+-----+---------+--------+ Radial               triphasic         +--------+--------+-----+---------+--------+ Ulnar                triphasic         +--------+--------+-----+---------+--------+  Summary: Right Carotid: Velocities in the right ICA are consistent with a 1-39% stenosis. Left Carotid: Velocities in the left ICA are consistent with a 1-39% stenosis. Vertebrals: Bilateral vertebral arteries demonstrate antegrade flow. Right ABI: Resting right ankle-brachial index is within normal range. No evidence of significant right lower extremity arterial disease. Left ABI: Resting left ankle-brachial index is within normal range. No evidence of significant left lower extremity arterial disease. Left Upper Extremity: Doppler waveform obliterate with left radial compression. Doppler waveforms decrease >50% with left ulnar compression.  Electronically signed by Curt Jews MD on 04/15/2020 at 7:14:30 PM.    Final    [place] EKG SITE RITE  Result Date: 04/21/2020 If Site  Rite image not attached, placement could not be confirmed due to current cardiac rhythm.  IR LUMBAR DISC ASPIRATION W/IMG GUIDE  Result Date: 04/07/2020 INDICATION: 69 year old male with history of paroxysmal atrial fibrillation, hypertension, bioprosthetic aortic valve replacement who had undergone recent cervical discectomy and fusion about 3 weeks ago. Admitted now with enterococcal bacteremia, sepsis now s/p 2 weeks of IV antibiotics. Patient was progressing well until the end of last week when he developed new back pain. MRI of the lumbar spine is concerning for L4-5 discitis. He comes or service for a fluoroscopy guided fine needle aspiration. EXAM: Fluoroscopy guided L4-5 disc fine needle aspiration MEDICATIONS: The patient is currently admitted to the hospital and receiving intravenous antibiotics. The antibiotics were administered within an appropriate time frame prior to the initiation of the procedure. ANESTHESIA/SEDATION: Fentanyl 2 mcg IV; Versed 50 mg IV Moderate  Sedation Time:  13 The patient was continuously monitored during the procedure by the interventional radiology nurse under my direct supervision. COMPLICATIONS: None immediate. PROCEDURE: Informed written consent was obtained from the patient after a thorough discussion of the procedural risks, benefits and alternatives. All questions were addressed. Maximal Sterile Barrier Technique was utilized including caps, mask, sterile gowns, sterile gloves, sterile drape, hand hygiene and skin antiseptic. A timeout was performed prior to the initiation of the procedure. The patient was made to lie prone on the angiography table. The lower lumbar spine was prepped and draped in a sterile fashion. Under fluoroscopy, the L4-5 disc space was delineated and the skin area was marked. The skin was infiltrated with a 1% Lidocaine approximately 5 cm lateral to the spinous process projection on the right. Then, an 18 gauge by 15 cm spinal needle was advanced  under fluoroscopy into the L4-5 disc space. A 22 gauge x 20 cm spinal needle was then advanced through the 18 gauge needle into the L4-5 disc. The disc was aspirated and serosanguineous fluid was obtained and sent into 2 capped syringes to the laboratory for microbiology analysis. The needle was subsequently withdrawn. The access site was cleaned and covered with a sterile bandage. IMPRESSION: Successful and uncomplicated fluoroscopy guided fine needle aspiration of the L4-5 disc. Sample sent for culture and staining. Electronically Signed   By: Pedro Earls M.D.   On: 04/07/2020 14:24   Discharge Instructions    [facility] pharmacist to adjust dose for Vancomycin, Aminoglycosides and other anti-infective therapies as requested by physician.   Complete by: As directed    [facility] to provide Cath Flo '2mg'$    Complete by: As directed    Administer for PICC line occlusion and as ordered by physician for other access device issues.   Amb Referral to Cardiac Rehabilitation   Complete by: As directed    Diagnosis: Valve Replacement   Valve: Aortic   After initial evaluation and assessments completed: Virtual Based Care may be provided alone or in conjunction with Phase 2 Cardiac Rehab based on patient barriers.: Yes   Anaphylaxis Kit: Provided to treat any anaphylactic reaction to the medication being provided to the patient if First Dose or when requested by physician   Complete by: As directed    Epinephrine '1mg'$ /ml vial / amp: Administer 0.'3mg'$  (0.80m) subcutaneously once for moderate to severe anaphylaxis, nurse to call physician and pharmacy when reaction occurs and call 911 if needed for immediate care   Diphenhydramine '50mg'$ /ml IV vial: Administer 25-'50mg'$  IV/IM PRN for first dose reaction, rash, itching, mild reaction, nurse to call physician and pharmacy when reaction occurs   Sodium Chloride 0.9% NS 5077mIV: Administer if needed for hypovolemic blood  pressure drop or as ordered by physician after call to physician with anaphylactic reaction   Change dressing on IV access line weekly and PRN   Complete by: As directed    Discharge patient   Complete by: As directed    CIR   Discharge [place]: [place]   Discharge patient date: 04/27/2020   Flush IV access with Sodium Chloride 0.9% and Heparin 10 units/ml or 100 units/ml   Complete by: As directed    Home infusion instructions - [facility]   Complete by: As directed    Instructions: Flush IV access with Sodium Chloride 0.9% and Heparin 10units/ml or 100units/ml   Change dressing on IV access line: Weekly and PRN  Instructions Cath Flo '2mg'$ : Administer for PICC Line occlusion and as ordered by physician for other access device   [facility] pharmacist to adjust dose for: Vancomycin, Aminoglycosides and other anti-infective therapies as requested by physician   Method of administration may be changed at the discretion of home infusion pharmacist based upon assessment of the patient and/or caregiver's ability to self-administer the medication ordered   Complete by: As directed      Discharge Medications: Allergies as of 04/30/2020      Reactions   Atorvastatin Other (See Comments)   Urinary retention    Nebivolol Diarrhea      Medication List    STOP taking these medications   nitroGLYCERIN 0.4 MG SL tablet Commonly known as: NITROSTAT     TAKE these medications   acetaminophen 325 MG tablet Commonly known as: TYLENOL Take 2 tablets (650 mg total) by mouth every 6 (six) hours as needed for moderate pain or fever.   amiodarone 400 MG tablet Commonly known as: PACERONE Take 1 tablet (400 mg total) by mouth 2 (two) times daily for 7 days.   ampicillin 2 g in sodium chloride 0.9 % 100 mL Inject 2 g into the vein every 4 (four) hours. What changed: when to take this   apixaban 5 MG Tabs tablet Commonly known as:  Eliquis Take 1 tablet (5 mg total) by mouth 2 (two) times daily.   aspirin 81 MG EC tablet Take 1 tablet (81 mg total) by mouth daily. Start taking on: May 01, 2020   cefTRIAXone 2 g in sodium chloride 0.9 % 100 mL Inject 2 g into the vein every 12 (twelve) hours.   cyclobenzaprine 5 MG tablet Commonly known as: FLEXERIL Take 1 tablet (5 mg total) by mouth 3 (three) times daily as needed for muscle spasms.   docusate sodium 100 MG capsule Commonly known as: COLACE Take 1 capsule (100 mg total) by mouth 2 (two) times daily.   ferrous [id number] C-folic acid capsule Commonly known as: TRINSICON / FOLTRIN Take 1 capsule by mouth daily with breakfast. For one month then stop.   insulin aspart 100 UNIT/ML injection Commonly known as: novoLOG Inject 0-15 Units into the skin 3 (three) times daily with meals.   insulin aspart 100 UNIT/ML injection Commonly known as: novoLOG Inject 3 Units into the skin 3 (three) times daily with meals.   insulin glargine 100 UNIT/ML injection Commonly known as: LANTUS Inject 0.1 mLs (10 Units total) into the skin 2 (two) times daily.   levothyroxine 25 MCG tablet Commonly known as: SYNTHROID Take 25 mcg by mouth daily before breakfast.   metoprolol tartrate 25 MG tablet Commonly known as: LOPRESSOR Take 1 tablet (25 mg total) by mouth 2 (two) times daily. What changed:   medication strength  how much to take  when to take this  reasons to take this   oxyCODONE 10 mg 12 hr tablet Commonly known as: OXYCONTIN Take 1 tablet (10 mg total) by mouth every 12 (twelve) hours.   pregabalin 100 MG capsule Commonly known as: LYRICA Take 1 capsule (100 mg total) by mouth 3 (three) times daily.   senna 8.6 MG Tabs tablet Commonly known as: SENOKOT Take 1 tablet (8.6 mg total) by mouth at bedtime.   vitamin C 500 MG tablet Commonly known as: ASCORBIC ACID Take 500 mg by mouth daily.            Discharge Care Instructions    (From admission, onward)  Start     Ordered   04/27/20 0000  Change dressing on IV access line weekly and PRN  (Home infusion instructions - [facility] )        04/27/20 1047         The patient has been discharged on:   1.Beta Blocker:  Yes [ x ]                              No   [   ]                              If No, reason:  2.Ace Inhibitor/ARB: Yes [   ]                                     No  [  x  ]                                     If No, reason: elevated creatinine  3.Statin:   Yes [   ]                  No  [  x ]                  If No, reason:No CAD, intolerance 4.S[place]a [place]:  Yes  [  x ]                  No   [   ]                  If No, reason:  Follow Up Appointments:  Follow-up Information    Gaye Pollack, MD. Go on 05/27/2020.   Specialty: Cardiothoracic Surgery Why: PA/LAT CXR to be taken (at [facility] which is in the same building as Dr. Vivi Martens office) on 07/07 at 10:00 am;Appointment time is at 10:30 am Contact information: [place] [address] [phone]        Revankar, Reita Cliche, MD .   Specialty: Cardiology Contact information: [address] [place]  [place] [address] [phone]               Signed:  Original Note by:  Nicholes Rough PA-C    Alessandria Henken N ContePA-C 04/30/2020, 1:43 PM

## 2020-04-17 NOTE — Progress Notes (Signed)
Progress Note  Patient Name: Johnathan Murray Date of Encounter: 04/17/2020  Primary Cardiologist: Jenean Lindau, MD  Subjective   Post op this AM. Awake, conversant. Pain well controlled.   Inpatient Medications    Scheduled Meds: . sodium chloride   Intravenous Once  . acetaminophen  1,000 mg Oral Q6H   Or  . acetaminophen (TYLENOL) oral liquid 160 mg/5 mL  1,000 mg Per Tube Q6H  . amiodarone  200 mg Oral BID  . vitamin C  500 mg Oral Daily  . aspirin EC  325 mg Oral Daily   Or  . aspirin  324 mg Per Tube Daily  . bisacodyl  10 mg Oral Daily   Or  . bisacodyl  10 mg Rectal Daily  . chlorhexidine gluconate (MEDLINE KIT)  15 mL Mouth Rinse BID  . Chlorhexidine Gluconate Cloth  6 each Topical Daily  . docusate sodium  200 mg Oral Daily  . enoxaparin (LOVENOX) injection  40 mg Subcutaneous QHS  . ferrous [id number] C-folic acid  1 capsule Oral BID PC  . furosemide  40 mg Intravenous BID  . insulin aspart  0-24 Units Subcutaneous Q4H  . insulin detemir  25 Units Subcutaneous Daily  . levothyroxine  25 mcg Oral QAC breakfast  . mouth rinse  15 mL Mouth Rinse BID  . oxyCODONE  10 mg Oral Q12H  . [START ON 04/18/2020] pantoprazole  40 mg Oral Daily  . pregabalin  100 mg Oral TID  . sodium chloride flush  10-40 mL Intracatheter Q12H  . sodium chloride flush  3 mL Intravenous Q12H   Continuous Infusions: . sodium chloride 20 mL/hr at 04/17/20 0700  . sodium chloride    . sodium chloride 20 mL/hr at 04/16/20 1933  . ampicillin IVPB 2 gram/NS 100 mL (Mini-Bag Plus) 2 g (04/17/20 0837)  . cefTRIAXone (ROCEPHIN) IVPB 2 gram/100 mL NS (Mini-Bag Plus) Stopped (04/17/20 0109)  . DOPamine    . insulin 2.2 mL/hr at 04/17/20 0700  . lactated ringers Stopped (04/16/20 2018)  . lactated ringers 20 mL/hr at 04/17/20 0700  . nitroGLYCERIN    . phenylephrine (NEO-SYNEPHRINE) Adult infusion 25 mcg/min (04/17/20 0700)   PRN Meds: sodium chloride, cyclobenzaprine,  dextrose, morphine injection, ondansetron (ZOFRAN) IV, sodium chloride flush, sodium chloride flush, traMADol   Vital Signs    Vitals:   04/17/20 0700 04/17/20 0715 04/17/20 0730 04/17/20 0845  BP:    (!) 105/49  Pulse: 89 89 89 89  Resp: '14 11 11 13  '$ Temp: (!) 96.6 F (35.9 C) (!) 97.3 F (36.3 C) (!) 97.5 F (36.4 C) (!) 96.6 F (35.9 C)  TempSrc:    Core  SpO2: 95% 94% 94% 98%  Weight:      Height:        Intake/Output Summary (Last 24 hours) at 04/17/2020 0857 Last data filed at 04/17/2020 0700 Gross per 24 hour  Intake 7238.37 ml  Output 3946 ml  Net 3292.37 ml   Filed Weights   04/16/20 0500 04/16/20 0541 04/17/20 0500  Weight: 78.7 kg 78.7 kg 88.5 kg    Physical Exam   GEN: Appears fatigued, multiple lines in place HEENT: Normal, moist mucous membranes NECK: Central line with PA catheter in place CARDIAC: regular rhythm, normal S1 and S2, no rubs or gallops. 1/6 SE murmur. VASCULAR: arterial line in place. Faintly palpable pedal pulses RESPIRATORY:  Shallow breathing, but clear  ABDOMEN: Soft, non-tender, non-distended MUSCULOSKELETAL:  Can move  limbs independently SKIN: Warm and dry, trivial LE edema NEUROLOGIC:  Alert and oriented PSYCHIATRIC:  Normal affect   Labs    Chemistry Recent Labs  Lab 04/14/20 0404 04/14/20 0404 04/15/20 1012 04/16/20 1940 04/16/20 2243 04/16/20 2343 04/17/20 0200  NA 137   < > 136   < > 140 140 139  K 3.9   < > 4.2   < > 4.5 4.3 4.2  CL 99  --  99  --   --   --  107  CO2 26  --  25  --   --   --  25  GLUCOSE 182*  --  121*  --   --   --  177*  BUN 19  --  17  --   --   --  16  CREATININE 1.48*  --  1.45*  --   --   --  1.13  CALCIUM 8.6*  --  8.6*  --   --   --  7.9*  PROT  --   --  6.1*  --   --   --   --   ALBUMIN  --   --  2.0*  --   --   --   --   AST  --   --  37  --   --   --   --   ALT  --   --  68*  --   --   --   --   ALKPHOS  --   --  92  --   --   --   --   BILITOT  --   --  0.6  --   --   --   --     GFRNONAA 48*  --  49*  --   --   --  >60  GFRAA 56*  --  57*  --   --   --  >60  ANIONGAP 12  --  12  --   --   --  7   < > = values in this interval not displayed.     Hematology Recent Labs  Lab 04/15/20 1012 04/15/20 1012 04/16/20 1554 04/16/20 1554 04/16/20 1939 04/16/20 1940 04/16/20 2243 04/16/20 2343 04/17/20 0200  WBC 10.4  --   --   --  15.8*  --   --   --  12.0*  RBC 3.20*  --   --   --  2.45*  --   --   --  2.35*  HGB 9.1*   < > 8.1*   < > 7.2*   < > 6.5* 6.8* 7.0*  HCT 29.6*   < > 24.5*   < > 22.2*   < > 19.0* 20.0* 20.9*  MCV 92.5  --   --   --  90.6  --   --   --  88.9  MCH 28.4  --   --   --  29.4  --   --   --  29.8  MCHC 30.7  --   --   --  32.4  --   --   --  33.5  RDW 16.5*  --   --   --  15.7*  --   --   --  15.6*  PLT 372   < > 125*  --  191  --   --   --  160   < > = values in this  interval not displayed.    Cardiac EnzymesNo results for input(s): TROPONINI in the last 168 hours. No results for input(s): TROPIPOC in the last 168 hours.   BNPNo results for input(s): BNP, PROBNP in the last 168 hours.   DDimer No results for input(s): DDIMER in the last 168 hours.   Radiology    DG Chest Port 1 View  Result Date: 04/17/2020 CLINICAL DATA:  Status post aortic valve repair EXAM: PORTABLE CHEST 1 VIEW COMPARISON:  04/16/2020 FINDINGS: Endotracheal tube and gastric catheter have been removed in the interval. Mediastinal drain and pericardial drain remain in place as does a Swan-Ganz catheter. Lungs are hypoinflated with mild basilar atelectasis. No pneumothorax is seen. No bony abnormality is noted. IMPRESSION: Tubes and lines as described above. Poor inspiratory effort with mild bibasilar atelectasis. Electronically Signed   By: Inez Catalina M.D.   On: 04/17/2020 08:46   DG Chest Port 1 View  Result Date: 04/16/2020 CLINICAL DATA:  Status post aortic valve repair. EXAM: PORTABLE CHEST 1 VIEW COMPARISON:  Apr 16, 2020. FINDINGS: Stable cardiomegaly.  Endotracheal and nasogastric tubes are in grossly good position. Right internal jugular catheter is noted with distal tip in expected position of main pulmonary artery. No pneumothorax or significant pleural effusion is noted. Minimal bibasilar subsegmental atelectasis is noted. Bony thorax is unremarkable. IMPRESSION: Minimal bibasilar subsegmental atelectasis. Endotracheal and nasogastric tubes are in grossly good position. No pneumothorax is noted. Electronically Signed   By: Marijo Conception M.D.   On: 04/16/2020 19:58   DG CHEST PORT 1 VIEW  Result Date: 04/16/2020 CLINICAL DATA:  Preop. EXAM: PORTABLE CHEST 1 VIEW COMPARISON:  Radiograph 03/20/2020 FINDINGS: Post median sternotomy. Prosthetic aortic valve. Mild cardiomegaly with normal mediastinal contours. No pulmonary edema, focal airspace disease, pleural effusion or pneumothorax. No acute osseous abnormalities are seen. IMPRESSION: Mild cardiomegaly without acute abnormality. Electronically Signed   By: Keith Rake M.D.   On: 04/16/2020 03:42   VAS US DOPPLER PRE CABG  Result Date: 04/15/2020 PREOPERATIVE VASCULAR EVALUATION  Indications:      Pre-CABG. Risk Factors:     Diabetes. Comparison Study: No prior studies. Performing Technologist: Carlos Levering Rvt  Examination Guidelines: A complete evaluation includes B-mode imaging, spectral Doppler, color Doppler, and power Doppler as needed of all accessible portions of each vessel. Bilateral testing is considered an integral part of a complete examination. Limited examinations for reoccurring indications may be performed as noted.  Right Carotid Findings: +----------+--------+--------+--------+-----------------------+--------+           PSV cm/sEDV cm/sStenosisDescribe               Comments +----------+--------+--------+--------+-----------------------+--------+ CCA Prox  87      17              smooth and heterogenous          +----------+--------+--------+--------+-----------------------+--------+ CCA Distal72      20              smooth and heterogenous         +----------+--------+--------+--------+-----------------------+--------+ ICA Prox  60      14              calcific                        +----------+--------+--------+--------+-----------------------+--------+ ICA Distal75      28                                              +----------+--------+--------+--------+-----------------------+--------+  ECA       89      14                                              +----------+--------+--------+--------+-----------------------+--------+ Portions of this table do not appear on this page. +----------+--------+-------+--------+------------+           PSV cm/sEDV cmsDescribeArm Pressure +----------+--------+-------+--------+------------+ [id number]                                  +----------+--------+-------+--------+------------+ +---------+--------+--+--------+-+---------+ VertebralPSV cm/s29EDV cm/s7Antegrade +---------+--------+--+--------+-+---------+ Left Carotid Findings: +----------+--------+--------+--------+-----------------------+--------+           PSV cm/sEDV cm/sStenosisDescribe               Comments +----------+--------+--------+--------+-----------------------+--------+ CCA Prox  89      15              smooth and heterogenous         +----------+--------+--------+--------+-----------------------+--------+ CCA Distal80      27              calcific                        +----------+--------+--------+--------+-----------------------+--------+ ICA Prox  71      24              calcific                        +----------+--------+--------+--------+-----------------------+--------+ ICA Distal85      34                                              +----------+--------+--------+--------+-----------------------+--------+ ECA       78       9                                               +----------+--------+--------+--------+-----------------------+--------+ +----------+--------+--------+--------+------------+ SubclavianPSV cm/sEDV cm/sDescribeArm Pressure +----------+--------+--------+--------+------------+           94                                   +----------+--------+--------+--------+------------+ +---------+--------+--+--------+--+---------+ VertebralPSV cm/s52EDV cm/s23Antegrade +---------+--------+--+--------+--+---------+  ABI Findings: +--------+------------------+-----+---------+--------------+ Right   Rt Pressure (mmHg)IndexWaveform Comment        +--------+------------------+-----+---------+--------------+ Brachial                                Restricted arm +--------+------------------+-----+---------+--------------+ PTA     163               1.24 triphasic               +--------+------------------+-----+---------+--------------+ DP      147               1.12 triphasic               +--------+------------------+-----+---------+--------------+ +--------+------------------+-----+---------+-------+ Left    Lt Pressure (mmHg)IndexWaveform Comment +--------+------------------+-----+---------+-------+  [phone]                    triphasic        +--------+------------------+-----+---------+-------+ PTA     156               1.19 triphasic        +--------+------------------+-----+---------+-------+ DP      138               1.05 triphasic        +--------+------------------+-----+---------+-------+ +-------+---------------+----------------+ ABI/TBIToday's ABI/TBIPrevious ABI/TBI +-------+---------------+----------------+ Right  1.24                            +-------+---------------+----------------+ Left   1.19                            +-------+---------------+----------------+  Right Doppler Findings:  +--------+--------+-----+-------+--------------+ Site    PressureIndexDopplerComments       +--------+--------+-----+-------+--------------+ Brachial                    Restricted arm +--------+--------+-----+-------+--------------+  Left Doppler Findings: +--------+--------+-----+---------+--------+ Site    PressureIndexDoppler  Comments +--------+--------+-----+---------+--------+ [id number]          triphasic         +--------+--------+-----+---------+--------+ Radial               triphasic         +--------+--------+-----+---------+--------+ Ulnar                triphasic         +--------+--------+-----+---------+--------+  Summary: Right Carotid: Velocities in the right ICA are consistent with a 1-39% stenosis. Left Carotid: Velocities in the left ICA are consistent with a 1-39% stenosis. Vertebrals: Bilateral vertebral arteries demonstrate antegrade flow. Right ABI: Resting right ankle-brachial index is within normal range. No evidence of significant right lower extremity arterial disease. Left ABI: Resting left ankle-brachial index is within normal range. No evidence of significant left lower extremity arterial disease. Left Upper Extremity: Doppler waveform obliterate with left radial compression. Doppler waveforms decrease >50% with left ulnar compression.  Electronically signed by Curt Jews MD on 04/15/2020 at 7:14:30 PM.    Final    Telemetry    Currently paced at 90 bpm. Underlying rhythm is sinus bradycardia this AM - Personally Reviewed  ECG    5.28.21 atrial paced- Personally Reviewed  Cardiac Studies   TEE 03/26/20: 1. Left ventricular ejection fraction, by estimation, is 60 to 65%. The  left ventricle has normal function. The left ventricle has no regional  wall motion abnormalities.  2. Right ventricular systolic function is normal. The right ventricular  size is normal. There is normal pulmonary artery systolic pressure.  3. No left  atrial/left atrial appendage thrombus was detected.  4. The mitral valve is myxomatous. Moderate mitral valve regurgitation.  No evidence of mitral stenosis.  5. The aortic valve has been repaired/replaced. Aortic valve  regurgitation is not visualized. No aortic stenosis is present. There is a  23 mm Edwards bioprosthetic valve present in the aortic position.  Procedure Date: 08/16/2016. Echo findings are  consistent with normal structure and function of the aortic valve  prosthesis.  6. The inferior vena cava is normal in size with greater than 50%  respiratory variability, suggesting right atrial pressure of 3 mmHg.    Echo 03/22/20: 1. There is no obvious vegetation, however transaortic gradients across  the bioprosthetic valve are increased from the prior study on 01/16/2020,  mean gradient 28 mmHg, previously 16 mmHg. Consider TEE for further  evaluation.  2. Left ventricular ejection fraction, by estimation, is 55 to 60%. The  left ventricle has normal function. The left ventricle has no regional  wall motion abnormalities. There is mild concentric left ventricular  hypertrophy. Left ventricular diastolic  parameters are consistent with Grade I diastolic dysfunction (impaired  relaxation). Elevated left atrial pressure.  3. Right ventricular systolic function is normal. The right ventricular  size is normal.  4. Left atrial size was mildly dilated.  5. The mitral valve is normal in structure. Mild mitral valve  regurgitation. No evidence of mitral stenosis.  6. The aortic valve has been repaired/replaced. Aortic valve  regurgitation is not visualized. No aortic stenosis is present. There is a  23 mm Edwards-SAPIEN valve present in the aortic position. Aortic valve  mean gradient measures 28.0 mmHg.  7. The inferior vena cava is normal in size with greater than 50%  respiratory variability, suggesting right atrial pressure of 3 mmHg.   CTA 04/14/20 FINDINGS: Image  quality: Average.  Noise artifact is: There is cardiac motion (PVC) and respiratory artifact.  Aortic Valve: There is a 23 mm Magna ease bioprosthetic valve present in the aortic position. The leaflet of the LCC appears thickened with likely vegetation. 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 There is a fistulous connection between the pseudoaneurysm and the LVOT anterior to the RCC of the aortic root.  Coronary Arteries: Normal coronary origin. Right dominance. CAC score not performed due to prior CABG. Please refer to recent cardiac cath for definitive assessment of coronary arteries and bypass grafts.  Bypass Grafts: A patent LIMA to mid LAD is seen. No other vein grafts observed. At least one is occluded, likely 2, on the anterior aspect of the aorta.  Left main: The left main is a large caliber vessel with a normal take off from the left coronary cusp that bifurcates to form a left anterior descending artery and a left circumflex artery. There is minimal calcified plaque (<25%).  Left anterior descending artery: The LAD is 100% occluded proximally.  Left circumflex artery: The LCX is non-dominant. The proximal LCX contains mild calcified plaque (25-49%). Distal evaluation is limited by artifact.  Right coronary artery: The RCA is dominant with normal take off from the right coronary cusp. There is mild to moderate heavily calcified plaque throughout. Vessel severity is unable to be determined. The RCA terminates as PDA and PLV branches with heavily calcified plaque.  Cardiac Morphology:  Right Atrium: Right atrial size is within normal limits.  Right Ventricle: The right ventricular cavity is within normal limits.  Left Atrium: Left atrial size is normal in size with no left atrial appendage filling defect.  Left  Ventricle: The ventricular cavity size is within normal limits.  Pulmonary arteries: Normal in size without proximal filling defect.  Pulmonary veins: Normal pulmonary venous drainage.  Pericardium: Normal thickness with no significant effusion or calcium present.  Mitral Valve: The mitral valve is normal structure without significant calcification.  Extra-cardiac findings: See attached radiology report for non-cardiac structures.  IMPRESSION: 1. Prosthetic valve (23 mm Magna ease) endocarditis with leaflet thickening concerning for vegetation.  2. 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  3. Fistulous connection between the pseudoaneurysm and LVOT described above.  3. 3-vessel CAD with patent LIMA-LAD.  Patient Profile     69 y.o. male with a history of CAD s/p CABG in 2017 with most recent cath in 01/2020 showing 2/3 grafts occluded, aortic stenosis s/p AVR at time of CABG, paroxysmal atrial fibrillation on Eliquis, hypertension, dyslipidemia, diabetes mellitus, recent cervical discectomywho was in CIR following prolonged hospitalization for sepsis. Cardiology consultedfor the evaluation of atrial flutterwith RVR.Found to have endocarditis and aortic root abscess. Now s/p aortic root replacement 5.27.21 by Dr. Cyndia Bent.  Assessment & Plan    Atrial flutter with RVR, reported history of atrial fibrillation as well: -On p.o. amiodarone 200 mg twice daily currently. If he goes into flutter postop, would use IV amiodarone -s/p TEE-CV 04/13/2020, with conversion to NSR  -due to plan for surgery, apixaban stopped 04/13/2020. Transitioned to heparin preoperatively.  -would restart anticoagulation when clear from a surgical perspective. He is in the high risk window post cardioversion, but need for urgent surgery required interruption of anticoagulation -CHA2DS2/VAS Stroke Risk Points = 4  Endocarditis of aortic  valve, with aortic root abscess of prior AVR (2017): -TTE 04/13/2020 and TEE 03/26/20 without clear evidence, butfound on TEE 04/13/20 -Followed by ID for antibiotic therapy, on ampicillin/ceftriaxone -underwent surgery by Dr. Cyndia Bent. Debridement of aortic root abscess/pseudoaneurysm. Had aortic root replacement with 69m homograft aortic valve/root with reimplantation of coronary buttons to homograft. -pressor wean, line removal per CT surgery schedule -will need to mobilize when able. May have issues with this given his discitis/septic arthritis. Monitor closely.   CAD s/p prior CABG -Last LHC 01/2020 with 2/3 grafts occluded with patent LIMA to LAD -No ASA as an outpatient, but now on aspirin 325 mg post CT surgery -apixaban stopped 04/13/2020 in anticipation of cardiac surgery -restart anticoagulation when clear from surgical standpoint. Would recommend dropping aspirin to 81 mg daily when anticoagulation restarted -does not appear he was on statin prior to his admission. Was on ezetimibe. Last LDL 105 that I can see. Would consider PCSK9i as an outpatient or trial of another statin (urinary retention on atorvastatin). History of mildly elevated LFTs. -restart beta blocker when able, monitor ECG/telemetry closely  Anemia: -was anemic preoperatively and received transfusion. Expected post op blood loss anemia today. Transfusion per CT surgery -will need to restart anticoagulation when able due to recent cardioversion. Monitor CBC closely.  BBuford Dresser MD, PhD [facility] [address] [place] [address]([phone]/28/2021, 8:57 AM     For questions or updates, please contact  Please consult [email or web address] for contact info under Cardiology/STEMI.

## 2020-04-17 NOTE — Discharge Instructions (Signed)

## 2020-04-17 NOTE — Progress Notes (Signed)
Cardiothoracic surgery, Dr. Cyndia Bent kindly assumed attending role this morning. TRH signed off but available if needed.

## 2020-04-18 ENCOUNTER — Inpatient Hospital Stay (HOSPITAL_COMMUNITY): Payer: BC Managed Care – PPO

## 2020-04-18 DIAGNOSIS — Z952 Presence of prosthetic heart valve: Secondary | ICD-10-CM | POA: Diagnosis not present

## 2020-04-18 LAB — GLUCOSE, CAPILLARY
Glucose-Capillary: 102 mg/dL — ABNORMAL HIGH (ref 70–99)
Glucose-Capillary: 110 mg/dL — ABNORMAL HIGH (ref 70–99)
Glucose-Capillary: 132 mg/dL — ABNORMAL HIGH (ref 70–99)
Glucose-Capillary: 157 mg/dL — ABNORMAL HIGH (ref 70–99)
Glucose-Capillary: 70 mg/dL (ref 70–99)
Glucose-Capillary: 71 mg/dL (ref 70–99)

## 2020-04-18 LAB — CBC
HCT: 26.5 % — ABNORMAL LOW (ref 39.0–52.0)
Hemoglobin: 9 g/dL — ABNORMAL LOW (ref 13.0–17.0)
MCH: 30.1 pg (ref 26.0–34.0)
MCHC: 34 g/dL (ref 30.0–36.0)
MCV: 88.6 fL (ref 80.0–100.0)
Platelets: 155 10*3/uL (ref 150–400)
RBC: 2.99 MIL/uL — ABNORMAL LOW (ref 4.22–5.81)
RDW: 16 % — ABNORMAL HIGH (ref 11.5–15.5)
WBC: 11.2 10*3/uL — ABNORMAL HIGH (ref 4.0–10.5)
nRBC: 0 % (ref 0.0–0.2)

## 2020-04-18 LAB — BASIC METABOLIC PANEL
Anion gap: 10 (ref 5–15)
BUN: 17 mg/dL (ref 8–23)
CO2: 26 mmol/L (ref 22–32)
Calcium: 8.1 mg/dL — ABNORMAL LOW (ref 8.9–10.3)
Chloride: 103 mmol/L (ref 98–111)
Creatinine, Ser: 1.27 mg/dL — ABNORMAL HIGH (ref 0.61–1.24)
GFR calc Af Amer: >60 mL/min (ref >60)
GFR calc non Af Amer: 58 mL/min — ABNORMAL LOW (ref >60)
Glucose, Bld: 82 mg/dL (ref 70–99)
Potassium: 4.5 mmol/L (ref 3.5–5.1)
Sodium: 139 mmol/L (ref 135–145)

## 2020-04-18 MED ORDER — SODIUM CHLORIDE 0.9 % IV SOLN
250.0000 mL | INTRAVENOUS | Status: DC | PRN
  Administered 2020-04-28: 500 mL via INTRAVENOUS

## 2020-04-18 MED ORDER — SODIUM CHLORIDE 0.9% FLUSH
3.0000 mL | Freq: Two times a day (BID) | INTRAVENOUS | Status: DC
  Administered 2020-04-18 – 2020-04-30 (×18): 3 mL via INTRAVENOUS

## 2020-04-18 MED ORDER — ~~LOC~~ CARDIAC SURGERY, PATIENT & FAMILY EDUCATION: Freq: Once | Status: AC

## 2020-04-18 MED ORDER — SODIUM CHLORIDE 0.9% FLUSH: 3.0000 mL | INTRAVENOUS | Status: DC | PRN

## 2020-04-18 MED ORDER — FUROSEMIDE 40 MG PO TABS: 40.0000 mg | ORAL_TABLET | Freq: Every day | ORAL | Status: DC

## 2020-04-18 MED ORDER — FUROSEMIDE 40 MG PO TABS
40.0000 mg | ORAL_TABLET | Freq: Every day | ORAL | Status: AC
  Administered 2020-04-19 – 2020-04-22 (×4): 40 mg via ORAL
  Filled 2020-04-18 (×4): qty 1

## 2020-04-18 NOTE — Evaluation (Signed)
Physical Therapy Evaluation Patient Details Name: Johnathan Murray MRN: [id number] DOB: October 26, 1951 Today's Date: 04/18/2020   History of Present Illness  Pt transferred from [place] on 5/24 after he developed afib with RVR and found to have new aortic root abscess with vegetations and prosthetic aortic valve endocarditis. Underwent  aortic root replacement on 04/16/20. Pt originally admitted 4/30 with acute discitis L4-5 and bil septic knees. Transferred to CIR on 5/18. PMH - ACDF on 4/12, back surg 05/2019, CAD, CABG, DM, HTN.  Clinical Impression  Pt admitted with above diagnosis and presents to PT with functional limitations due to deficits listed below (See PT problem list). Pt needs skilled PT to maximize independence and safety to allow discharge back to CIR. Pt's back pain seems to be better controlled than during prior therapy enabling him to participate fully.      Follow Up Recommendations CIR    Equipment Recommendations  Other (comment)(To be determined)    Recommendations for Other Services OT consult     Precautions / Restrictions Precautions Precautions: Sternal;Fall;Cervical Precaution Comments: ACDF on 4/12, pt reports they d/c-ed collar 1 week after surgery Required Braces or Orthoses: Spinal Brace Spinal Brace: Lumbar corset Other Brace: for comfort      Mobility  Bed Mobility Overal bed mobility: Needs Assistance Bed Mobility: Rolling;Sidelying to Sit;Sit to Sidelying Rolling: Mod assist Sidelying to sit: Mod assist     Sit to sidelying: +2 for physical assistance;Mod assist General bed mobility comments: Assist to bring shoulders over to roll. Assist to bring legs off of bed, elevate trunk into sitting and bring hips to EOB. Assist to lower shoulders and bring feet back up into bed.  Transfers Overall transfer level: Needs assistance Equipment used: Ambulation equipment used Transfers: Sit to/from Stand Sit to Stand: +2 physical assistance;Mod assist;From  elevated surface         General transfer comment: assist to bring hips up and for balance. Used bed pad under hips or hands under hips to raise. Verbal cues for pt to place hands on bar of [place] but not to pull up with them. Bed to bsc to bed using [facility]  Ambulation/Gait                Stairs            Wheelchair Mobility    Modified Rankin (Stroke Patients Only)       Balance Overall balance assessment: Needs assistance Sitting-balance support: Bilateral upper extremity supported;Feet supported Sitting balance-Leahy Scale: Poor Sitting balance - Comments: UE support and min guard for safety   Standing balance support: Bilateral upper extremity supported Standing balance-Leahy Scale: Poor Standing balance comment: Mod assist to maintain standing in [place]                             Pertinent Vitals/Pain Pain Assessment: Faces Faces Pain Scale: Hurts even more Pain Location: back with mobility Pain Descriptors / Indicators: Grimacing;Guarding Pain Intervention(s): Limited activity within patient's tolerance;Monitored during session;Repositioned;Premedicated before session    Home Living Family/patient expects to be discharged to:: Private residence Living Arrangements: Spouse/significant other Available Help at Discharge: Family;Available 24 hours/day Type of Home: House Home Access: Stairs to enter Entrance Stairs-Rails: Can reach both Entrance Stairs-Number of Steps: 7 Home Layout: One level Home Equipment: Grab bars - tub/shower;Shower seat - built in;Cane - single point;Walker - 2 wheels Additional Comments: Grab bars in shower with built in seat, no grab  bars near toilet    Prior Function     Gait / Transfers Assistance Needed: Prior to admission on 4/28 pt amb with RW or cane           Hand Dominance   Dominant Hand: Right    Extremity/Trunk Assessment   Upper Extremity Assessment Upper Extremity Assessment: Defer to OT  evaluation    Lower Extremity Assessment Lower Extremity Assessment: Generalized weakness       Communication   Communication: No difficulties  Cognition Arousal/Alertness: Awake/alert Behavior During Therapy: WFL for tasks assessed/performed Overall Cognitive Status: Within Functional Limits for tasks assessed                                        General Comments General comments (skin integrity, edema, etc.): VSS    Exercises     Assessment/Plan    PT Assessment Patient needs continued PT services  PT Problem List Decreased strength;Decreased activity tolerance;Decreased balance;Decreased mobility;Pain;Decreased knowledge of precautions       PT Treatment Interventions DME instruction;Gait training;Stair training;Functional mobility training;Therapeutic activities;Therapeutic exercise;Balance training;Patient/family education    PT Goals (Current goals can be found in the Care Plan section)  Acute Rehab PT Goals Patient Stated Goal: get better PT Goal Formulation: With patient/family Time For Goal Achievement: 05/02/20 Potential to Achieve Goals: Good    Frequency Min 3X/week   Barriers to discharge        Co-evaluation               AM-PAC PT "6 Clicks" Mobility  Outcome Measure Help needed turning from your back to your side while in a flat bed without using bedrails?: A Lot Help needed moving from lying on your back to sitting on the side of a flat bed without using bedrails?: A Lot Help needed moving to and from a bed to a chair (including a wheelchair)?: Total Help needed standing up from a chair using your arms (e.g., wheelchair or bedside chair)?: Total Help needed to walk in hospital room?: Total Help needed climbing 3-5 steps with a railing? : Total 6 Click Score: 8    End of Session Equipment Utilized During Treatment: Gait belt Activity Tolerance: Patient tolerated treatment well Patient left: in bed;with call bell/phone  within reach;with nursing/sitter in room;with family/visitor present Nurse Communication: Mobility status;Need for lift equipment(nursing assisted) PT Visit Diagnosis: Other abnormalities of gait and mobility (R26.89);Muscle weakness (generalized) (M62.81);Pain Pain - part of body: (back)    Time: [id number] PT Time Calculation (min) (ACUTE ONLY): 34 min   Charges:   PT Evaluation $PT Eval Moderate Complexity: 1 Mod PT Treatments $Therapeutic Activity: 8-22 mins        [place] Pager [phone] Office [place] 04/18/2020, 5:34 PM

## 2020-04-18 NOTE — Progress Notes (Signed)
      [address]       [place] [address]             [phone]                 2 Days Post-Op Procedure(s) (LRB): REDO STERNOTOMY (N/A) ASCENDING AORTIC ROOT REPLACEMENT - HOMOGRAFT using [employer] 23 MM Cryo Aortic Valve. (N/A) TRANSESOPHAGEAL ECHOCARDIOGRAM (TEE) (N/A)   Events: No events.  Complains of pain at the CT sites _______________________________________________________________ Vitals: BP 126/71   Pulse 89   Temp 99.1 F (37.3 C) (Oral)   Resp 12   Ht 5\' 10"  (1.778 m)   Wt 88.2 kg   SpO2 94%   BMI 27.90 kg/m   - Neuro: alert NAD  - Cardiovascular: sinus in 80s  Drips: dopamine 3.   PAP: (31-37)/(18-23) 37/23  - Pulm: EWOB, clear    ABG    Component Value Date/Time   PHART 7.409 04/16/2020 2343   PCO2ART 39.2 04/16/2020 2343   PO2ART 80 (L) 04/16/2020 2343   HCO3 25.0 04/16/2020 2343   TCO2 26 04/16/2020 2343   O2SAT 96.0 04/16/2020 2343    - Abd: soft - Extremity: warm  .Intake/Output      05/28 0701 - 05/29 0700 05/29 0701 - 05/30 0700   P.O. 240    I.V. (mL/kg) 1014 (11.5)    Blood 630    IV Piggyback 809.8    Total Intake(mL/kg) 2693.8 (30.5)    Urine (mL/kg/hr) 3410 (1.6) 200 (0.6)   Blood     Chest Tube 200    Total Output 3610 200   Net -916.2 -200           _______________________________________________________________ Labs: CBC Latest Ref Rng & Units 04/18/2020 04/17/2020 04/17/2020  WBC 4.0 - 10.5 K/uL 11.2(H) 11.0(H) 12.0(H)  Hemoglobin 13.0 - 17.0 g/dL 9.0(L) 9.3(L) 7.0(L)  Hematocrit 39.0 - 52.0 % 26.5(L) 27.6(L) 20.9(L)  Platelets 150 - 400 K/uL 155 161 160   CMP Latest Ref Rng & Units 04/18/2020 04/17/2020 04/17/2020  Glucose 70 - 99 mg/dL 82 90 177(H)  BUN 8 - 23 mg/dL 17 15 16   Creatinine 0.61 - 1.24 mg/dL 1.27(H) 1.13 1.13  Sodium 135 - 145 mmol/L 139 139 139  Potassium 3.5 - 5.1 mmol/L 4.5 3.7 4.2  Chloride 98 - 111 mmol/L 103 106 107  CO2 22 - 32 mmol/L 26 23 25   Calcium 8.9 - 10.3  mg/dL 8.1(L) 8.3(L) 7.9(L)  Total Protein 6.5 - 8.1 g/dL - - -  Total Bilirubin 0.3 - 1.2 mg/dL - - -  Alkaline Phos 38 - 126 U/L - - -  AST 15 - 41 U/L - - -  ALT 0 - 44 U/L - - -    CXR: clear  _______________________________________________________________  Assessment and Plan: POD 2 s/p root replacement for PVE  Neuro: pain controlled. Will remove CT CV: stable rhythm.  Pacer set to back-up of 50 Pulm: continue pulm toilet Renal: will d/c dopamine.  Creat slightly up.  Made good urine.  Will decrease lasix for now GI: on diet Heme: stable ID: on abx for PVE Endo: SSI  Dispo: floor today  Melodie Bouillon, MD 04/18/2020 11:00 AM

## 2020-04-19 LAB — TYPE AND SCREEN
ABO/RH(D): A POS
Antibody Screen: NEGATIVE
Unit division: 0
Unit division: 0
Unit division: 0
Unit division: 0
Unit division: 0
Unit division: 0
Unit division: 0
Unit division: 0

## 2020-04-19 LAB — CBC
HCT: 29.5 % — ABNORMAL LOW (ref 39.0–52.0)
Hemoglobin: 9.6 g/dL — ABNORMAL LOW (ref 13.0–17.0)
MCH: 29.8 pg (ref 26.0–34.0)
MCHC: 32.5 g/dL (ref 30.0–36.0)
MCV: 91.6 fL (ref 80.0–100.0)
Platelets: 170 10*3/uL (ref 150–400)
RBC: 3.22 MIL/uL — ABNORMAL LOW (ref 4.22–5.81)
RDW: 16.4 % — ABNORMAL HIGH (ref 11.5–15.5)
WBC: 13.8 10*3/uL — ABNORMAL HIGH (ref 4.0–10.5)
nRBC: 0 % (ref 0.0–0.2)

## 2020-04-19 LAB — BPAM RBC
Blood Product Expiration Date: 202106022359
Blood Product Expiration Date: 202106042359
Blood Product Expiration Date: 202106042359
Blood Product Expiration Date: 202106062359
Blood Product Expiration Date: 202106132359
Blood Product Expiration Date: 202106142359
Blood Product Expiration Date: 202106162359
Blood Product Expiration Date: 202106162359
ISSUE DATE / TIME: 202105240759
ISSUE DATE / TIME: 202105270802
ISSUE DATE / TIME: 202105270802
ISSUE DATE / TIME: 202105271320
ISSUE DATE / TIME: 202105272059
ISSUE DATE / TIME: 202105280905
ISSUE DATE / TIME: 202105281124
Unit Type and Rh: 6200
Unit Type and Rh: 6200
Unit Type and Rh: 6200
Unit Type and Rh: 6200
Unit Type and Rh: 6200
Unit Type and Rh: 6200
Unit Type and Rh: 6200
Unit Type and Rh: 6200

## 2020-04-19 LAB — GLUCOSE, CAPILLARY
Glucose-Capillary: 103 mg/dL — ABNORMAL HIGH (ref 70–99)
Glucose-Capillary: 65 mg/dL — ABNORMAL LOW (ref 70–99)
Glucose-Capillary: 65 mg/dL — ABNORMAL LOW (ref 70–99)
Glucose-Capillary: 71 mg/dL (ref 70–99)
Glucose-Capillary: 80 mg/dL (ref 70–99)
Glucose-Capillary: 84 mg/dL (ref 70–99)
Glucose-Capillary: 91 mg/dL (ref 70–99)
Glucose-Capillary: 94 mg/dL (ref 70–99)

## 2020-04-19 LAB — BASIC METABOLIC PANEL
Anion gap: 11 (ref 5–15)
BUN: 26 mg/dL — ABNORMAL HIGH (ref 8–23)
CO2: 27 mmol/L (ref 22–32)
Calcium: 8.3 mg/dL — ABNORMAL LOW (ref 8.9–10.3)
Chloride: 100 mmol/L (ref 98–111)
Creatinine, Ser: 1.51 mg/dL — ABNORMAL HIGH (ref 0.61–1.24)
GFR calc Af Amer: 55 mL/min — ABNORMAL LOW (ref >60)
GFR calc non Af Amer: 47 mL/min — ABNORMAL LOW (ref >60)
Glucose, Bld: 72 mg/dL (ref 70–99)
Potassium: 4.5 mmol/L (ref 3.5–5.1)
Sodium: 138 mmol/L (ref 135–145)

## 2020-04-19 NOTE — Progress Notes (Addendum)
Hypoglycemic Event  CBG: 65 @ 0531  Treatment: 237ml of ensure, patient refused orange juice and and soda Symptoms: pt reported no symptom   Follow-up CBG: Time:0556 CBG Result:84  Possible Reasons for Event: Inadequate meal intake  Comments/MD notified:    Renee Rival

## 2020-04-19 NOTE — Progress Notes (Addendum)
      [address]       [place] [address]             [phone]      3 Days Post-Op Procedure(s) (LRB): REDO STERNOTOMY (N/A) ASCENDING AORTIC ROOT REPLACEMENT - HOMOGRAFT using [employer] 23 MM Cryo Aortic Valve. (N/A) TRANSESOPHAGEAL ECHOCARDIOGRAM (TEE) (N/A) Subjective: Back pain and constipation this morning.   Objective: Vital signs in last 24 hours: Temp:  [97.6 F (36.4 C)-98.6 F (37 C)] 97.8 F (36.6 C) (05/30 0808) Pulse Rate:  [58-90] 64 (05/30 0808) Cardiac Rhythm: Normal sinus rhythm (05/30 0851) Resp:  [8-19] 16 (05/30 0808) BP: (101-155)/(59-92) 131/71 (05/30 0808) SpO2:  [97 %-100 %] 97 % (05/30 0808) Weight:  [91.1 kg-92.2 kg] 92.2 kg (05/30 0500)     Intake/Output from previous day: 05/29 0701 - 05/30 0700 In: 1462.5 [P.O.:480; I.V.:282.4; IV Piggyback:700.1] Out: [id number] [Urine:5480; Chest Tube:60] Intake/Output this shift: No intake/output data recorded.  General appearance: alert, cooperative and no distress Heart: regular rate and rhythm, S1, S2 normal, no murmur, click, rub or gallop Lungs: clear to auscultation bilaterally and diminished in the lower lobes Abdomen: few BS Extremities: 1+ pitting edema in lower ext Wound: clean and dry  Lab Results: Recent Labs    04/18/20 0345 04/19/20 0306  WBC 11.2* 13.8*  HGB 9.0* 9.6*  HCT 26.5* 29.5*  PLT 155 170   BMET:  Recent Labs    04/18/20 0345 04/19/20 0306  NA 139 138  K 4.5 4.5  CL 103 100  CO2 26 27  GLUCOSE 82 72  BUN 17 26*  CREATININE 1.27* 1.51*  CALCIUM 8.1* 8.3*    PT/INR:  Recent Labs    04/16/20 1939  LABPROT 11.3*  INR 0.9   ABG    Component Value Date/Time   PHART 7.409 04/16/2020 2343   HCO3 25.0 04/16/2020 2343   TCO2 26 04/16/2020 2343   O2SAT 96.0 04/16/2020 2343   CBG (last 3)  Recent Labs    04/19/20 0001 04/19/20 0531 04/19/20 0556  GLUCAP 94 65* 84    Assessment/Plan: S/P Procedure(s) (LRB): REDO STERNOTOMY  (N/A) ASCENDING AORTIC ROOT REPLACEMENT - HOMOGRAFT using [employer] 23 MM Cryo Aortic Valve. (N/A) TRANSESOPHAGEAL ECHOCARDIOGRAM (TEE) (N/A)  1. CV-NSR in the 60s, BP well controlled. Continue Amio 200mg  BID, asa, no BB 2. Pulm-tolerating room air with good saturation 3. Renal-creatinine 1.51, will trend. Weight remains up about 10kg. Continue lasix 40mg  daily.  4. ID- continue omnipen and rocephin for enterococcal sepsis and endocarditis. ID following 5. Spinal osteo/discitis with small psoas abscesses and probable myositis in paraspinous muscles related to enterococcal bacteremia. He had persistent severe pain preop due to this and may need to be reimaged if it does not improve. [name] is his neurosurgeon 6. Endo- blood glucose well controlled 7. Continue lovenox for DVT prophylaxis  Plan: EPW still in place. Will plan to remove tomorrow. Continue lasix for fluid overload. Continue antibiotics. Patient asking for suppository which is ordered. Continue pain control.  Discontinued morphine but can add back if the patient needs it.     LOS: 6 days    Johnathan Murray 04/19/2020   Doing well. Mobility limited due to spinal abscess. Continue antibiotics Dispo planning.  Johnathan Murray

## 2020-04-20 LAB — GLUCOSE, CAPILLARY
Glucose-Capillary: 63 mg/dL — ABNORMAL LOW (ref 70–99)
Glucose-Capillary: 114 mg/dL — ABNORMAL HIGH (ref 70–99)
Glucose-Capillary: 78 mg/dL (ref 70–99)
Glucose-Capillary: 152 mg/dL — ABNORMAL HIGH (ref 70–99)
Glucose-Capillary: 111 mg/dL — ABNORMAL HIGH (ref 70–99)
Glucose-Capillary: 79 mg/dL (ref 70–99)

## 2020-04-20 LAB — BASIC METABOLIC PANEL
Anion gap: 8 (ref 5–15)
BUN: 19 mg/dL (ref 8–23)
CO2: 27 mmol/L (ref 22–32)
Calcium: 8.5 mg/dL — ABNORMAL LOW (ref 8.9–10.3)
Chloride: 103 mmol/L (ref 98–111)
Creatinine, Ser: 1.39 mg/dL — ABNORMAL HIGH (ref 0.61–1.24)
GFR calc Af Amer: >60 mL/min (ref >60)
GFR calc non Af Amer: 52 mL/min — ABNORMAL LOW (ref >60)
Glucose, Bld: 143 mg/dL — ABNORMAL HIGH (ref 70–99)
Potassium: 4.4 mmol/L (ref 3.5–5.1)
Sodium: 138 mmol/L (ref 135–145)

## 2020-04-20 LAB — CULTURE, BLOOD (ROUTINE X 2)
Culture: NO GROWTH
Culture: NO GROWTH
Special Requests: ADEQUATE
Special Requests: ADEQUATE

## 2020-04-20 MED ORDER — METOLAZONE 5 MG PO TABS
2.5000 mg | ORAL_TABLET | Freq: Every day | ORAL | Status: AC
  Administered 2020-04-21 – 2020-04-22 (×2): 2.5 mg via ORAL
  Filled 2020-04-20 (×2): qty 1

## 2020-04-20 MED ORDER — MORPHINE SULFATE (PF) 2 MG/ML IV SOLN
2.0000 mg | Freq: Once | INTRAVENOUS | Status: AC
  Administered 2020-04-20: 2 mg via INTRAVENOUS
  Filled 2020-04-20: qty 1

## 2020-04-20 MED ORDER — MORPHINE SULFATE (PF) 2 MG/ML IV SOLN
1.0000 mg | Freq: Four times a day (QID) | INTRAVENOUS | Status: DC | PRN
  Administered 2020-04-20 – 2020-04-26 (×6): 2 mg via INTRAVENOUS
  Administered 2020-04-28: 1 mg via INTRAVENOUS
  Administered 2020-04-28: 2 mg via INTRAVENOUS
  Administered 2020-04-29 (×2): 1 mg via INTRAVENOUS
  Filled 2020-04-20 (×10): qty 1

## 2020-04-20 MED ORDER — MORPHINE SULFATE (PF) 2 MG/ML IV SOLN: 1.0000 mg | INTRAVENOUS | Status: DC | PRN

## 2020-04-20 MED ORDER — INSULIN DETEMIR 100 UNIT/ML ~~LOC~~ SOLN
20.0000 [IU] | Freq: Every day | SUBCUTANEOUS | Status: DC
  Administered 2020-04-20: 20 [IU] via SUBCUTANEOUS
  Filled 2020-04-20 (×2): qty 0.2

## 2020-04-20 NOTE — Progress Notes (Addendum)
      [name]       [place] [address]             [phone]      4 Days Post-Op Procedure(s) (LRB): REDO STERNOTOMY (N/A) ASCENDING AORTIC ROOT REPLACEMENT - HOMOGRAFT using [employer] 23 MM Cryo Aortic Valve. (N/A) TRANSESOPHAGEAL ECHOCARDIOGRAM (TEE) (N/A) Subjective: Back pain and constipation remains an issue.   Objective: Vital signs in last 24 hours: Temp:  [97.9 F (36.6 C)-98.2 F (36.8 C)] 98 F (36.7 C) (05/31 0805) Pulse Rate:  [62-72] 72 (05/31 0805) Cardiac Rhythm: Normal sinus rhythm (05/31 0806) Resp:  [12-19] 16 (05/31 0805) BP: (111-146)/(58-86) 146/86 (05/31 0805) SpO2:  [96 %-99 %] 97 % (05/31 0805) Weight:  [91.6 kg] 91.6 kg (05/31 0403)     Intake/Output from previous day: 05/30 0701 - 05/31 0700 In: 240 [P.O.:240] Out: 3400 [Urine:3400] Intake/Output this shift: No intake/output data recorded.  General appearance: alert, cooperative and no distress Heart: regular rate and rhythm, S1, S2 normal, no murmur, click, rub or gallop Lungs: clear to auscultation bilaterally Abdomen: soft, non-tender; bowel sounds normal; no masses,  no organomegaly Extremities: extremities normal, atraumatic, no cyanosis or edema Wound: clean and dry  Lab Results: Recent Labs    04/18/20 0345 04/19/20 0306  WBC 11.2* 13.8*  HGB 9.0* 9.6*  HCT 26.5* 29.5*  PLT 155 170   BMET:  Recent Labs    04/18/20 0345 04/19/20 0306  NA 139 138  K 4.5 4.5  CL 103 100  CO2 26 27  GLUCOSE 82 72  BUN 17 26*  CREATININE 1.27* 1.51*  CALCIUM 8.1* 8.3*    PT/INR: No results for input(s): LABPROT, INR in the last 72 hours. ABG    Component Value Date/Time   PHART 7.409 04/16/2020 2343   HCO3 25.0 04/16/2020 2343   TCO2 26 04/16/2020 2343   O2SAT 96.0 04/16/2020 2343   CBG (last 3)  Recent Labs    04/20/20 0402 04/20/20 0436 04/20/20 0808  GLUCAP 63* 114* 78    Assessment/Plan: S/P Procedure(s) (LRB): REDO STERNOTOMY  (N/A) ASCENDING AORTIC ROOT REPLACEMENT - HOMOGRAFT using [employer] 23 MM Cryo Aortic Valve. (N/A) TRANSESOPHAGEAL ECHOCARDIOGRAM (TEE) (N/A)   1. CV-NSR in the 70s, BP well controlled. Continue Amio 200mg  BID, asa, no BB 2. Pulm-tolerating room air with good saturation 3. Renal-creatinine 1.51, will trend. Weight remains up about 10kg. Continue lasix 40mg  daily.  4. ID- continue omnipen and rocephin for enterococcal sepsis and endocarditis. ID following 5. Spinal osteo/discitis with small psoas abscesses and probable myositis in paraspinous muscles related to enterococcal bacteremia. He had persistent severe pain preop due to this and may need to be reimaged if it does not improve. [name] is his neurosurgeon 6. Endo- will decrease Levemir due to hypoglycemia overnight. 7. Continue lovenox for DVT prophylaxis  Plan: Will add morphine back at a reduced dose for back pain. It is limiting patient's mobility significantly. EPW removed today. Will try another suppository for constipation.    LOS: 7 days    Johnathan Murray 04/20/2020  bilateral low back pain biggest complaint and limitation to progressing  I have seen and examined Johnathan Murray and agree with the above assessment  and plan.  Grace Isaac MD [place] [phone] Office [phone] 04/20/2020 12:26 PM

## 2020-04-20 NOTE — Progress Notes (Signed)
Rehab Admissions Coordinator Note:  Per PT recommendation, this patient was screened by Raechel Ache for appropriateness for an Inpatient Acute Rehab Consult.  At this time, we are recommending Inpatient Rehab consult. AC will contact attending service to request order.   Raechel Ache 04/20/2020, 9:56 AM  I can be reached at [phone].

## 2020-04-20 NOTE — Progress Notes (Signed)
Physical Therapy Treatment Patient Details Name: Johnathan Murray MRN: [id number] DOB: [birth year] Today's Date: 04/20/2020    History of Present Illness Pt transferred from [place] on 5/24 after he developed afib with RVR and found to have new aortic root abscess with vegetations and prosthetic aortic valve endocarditis. Underwent  aortic root replacement on 04/16/20. Pt originally admitted 4/30 with acute discitis L4-5 and bil septic knees. Transferred to CIR on 5/18. PMH - ACDF on 4/12, back surg 05/2019, CAD, CABG, DM, HTN.    PT Comments    Patient had just returned to bed with OT upon PT arrival reporting increased pain despite administration of morphine. Tolerated LE there ex in bed. RLE appears stronger then LLE but pt able to mobilize BLEs against gravity. Instructed pt in HEP to perform while in the hospital daily to improve LE strength/ROM. Pt agreeable. Appears in a sad mood regarding hospitalization and current medical condition. Will continue to follow and progress as tolerated.   Follow Up Recommendations  CIR     Equipment Recommendations  Other (comment)(defer to CIR)    Recommendations for Other Services       Precautions / Restrictions Precautions Precautions: Sternal;Fall;Cervical Precaution Comments: ACDF on 4/12, pt reports they d/c-ed collar 1 week after surgery, pacing wires Required Braces or Orthoses: Spinal Brace Spinal Brace: Lumbar corset Other Brace: for comfort, no brace in room Restrictions Weight Bearing Restrictions: No    Mobility  Bed Mobility               General bed mobility comments: Pt had just gotten back into bed with OT upon PT arrival but pt agreeable to LE exercises in bed. Reports pain bad today with mobility. Given morphine prior to arrival.  Transfers                    Ambulation/Gait                 Stairs             Wheelchair Mobility    Modified Rankin (Stroke Patients Only)        Balance                                            Cognition Arousal/Alertness: Awake/alert Behavior During Therapy: WFL for tasks assessed/performed Overall Cognitive Status: No family/caregiver present to determine baseline cognitive functioning                                        Exercises General Exercises - Lower Extremity Ankle Circles/Pumps: AROM;Both;15 reps;Supine Quad Sets: AROM;Both;10 reps;Supine Heel Slides: AROM;Both;10 reps;Supine Hip ABduction/ADduction: AROM;Both;10 reps;Supine Straight Leg Raises: AROM;AAROM;Both;10 reps;Supine(assist needed with LLE not RLE)    General Comments        Pertinent Vitals/Pain Pain Assessment: No/denies pain    Home Living                      Prior Function            PT Goals (current goals can now be found in the care plan section) Progress towards PT goals: Not progressing toward goals - comment(due to having just returned to supine with OT and pain)    Frequency  Min 3X/week      PT Plan Current plan remains appropriate    Co-evaluation              AM-PAC PT "6 Clicks" Mobility   Outcome Measure  Help needed turning from your back to your side while in a flat bed without using bedrails?: A Lot Help needed moving from lying on your back to sitting on the side of a flat bed without using bedrails?: A Lot Help needed moving to and from a bed to a chair (including a wheelchair)?: Total Help needed standing up from a chair using your arms (e.g., wheelchair or bedside chair)?: Total Help needed to walk in hospital room?: Total Help needed climbing 3-5 steps with a railing? : Total 6 Click Score: 8    End of Session   Activity Tolerance: Patient tolerated treatment well Patient left: in bed;with call bell/phone within reach;with bed alarm set Nurse Communication: Mobility status;Need for lift equipment PT Visit Diagnosis: Other abnormalities of gait and  mobility (R26.89);Muscle weakness (generalized) (M62.81)     Time: 0950-1005 PT Time Calculation (min) (ACUTE ONLY): 15 min  Charges:  $Therapeutic Exercise: 8-22 mins                     Marisa Severin, PT, DPT Acute Rehabilitation Services Pager [phone] Office [phone]       Marguarite Arbour A Sabra Heck 04/20/2020, 3:39 PM

## 2020-04-20 NOTE — Progress Notes (Signed)
EPW pulled per protocol and as ordered. All ends intact. Slight resistance met with left ventricle wire but then was removed. Patient reminded to lie supine approximately one hour. Will monitor patient. 144/85 bp heart rate 65 Adalaide Jaskolski, Bettina Gavia RN

## 2020-04-20 NOTE — Progress Notes (Signed)
Occupational Therapy Evaluation Patient Details Name: Johnathan Murray MRN: [id number] DOB: 06/22/1951 Today's Date: 04/20/2020    History of Present Illness Pt transferred from [place] on 5/24 after he developed afib with RVR and found to have new aortic root abscess with vegetations and prosthetic aortic valve endocarditis. Underwent  aortic root replacement on 04/16/20. Pt originally admitted 4/30 with acute discitis L4-5 and bil septic knees. Transferred to CIR on 5/18. PMH - ACDF on 4/12, back surg 05/2019, CAD, CABG, DM, HTN.   Clinical Impression   Prior to hospitalization, pt independent with ADL and mobility. 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 Pt premedicated with Morphine prior to session and able to progress to EOB however unable to stand at this time. Pt requires +2 assistance with mobility and Max A with LB ADL due to pain, significant BLE weakness and deficits listed below. Recommend rehab at CIR to maximize functional level of independence and facilitate safe DC home with family.     Follow Up Recommendations  CIR;Supervision/Assistance - 24 hour    Equipment Recommendations  None recommended by OT    Recommendations for Other Services Rehab consult     Precautions / Restrictions Precautions Precautions: Sternal;Fall;Cervical; (pacing wires) Precaution Comments: ACDF on 4/12, pt reports they d/c-ed collar 1 week after surgery Spinal Brace: Lumbar corset Other Brace: for comfort(no brace in room)      Mobility Bed Mobility Overal bed mobility: Needs Assistance Bed Mobility: Sidelying to Sit;Sit to Sidelying Rolling: Mod assist Sidelying to sit: Mod assist     Sit to sidelying: Mod assist;+2 for physical assistance    Transfers Overall transfer level: Needs assistance               General transfer comment: +2 A with squat to scoot forward while sitting EOB; unable to tolerate  attempts at standing this session. May do well with [facility]    Balance     Sitting balance-Leahy Scale: Poor Sitting balance - Comments: Pt requires support due to back pain                                   ADL either performed or assessed with clinical judgement   ADL Overall ADL's : Needs assistance/impaired     Grooming: Set up;Sitting   Upper Body Bathing: Set up;Sitting   Lower Body Bathing: Maximal assistance;Bed level   Upper Body Dressing : Minimal assistance;Sitting   Lower Body Dressing: Maximal assistance;Bed level   Toilet Transfer: (unable to complete this session due to pain)   Toileting- Clothing Manipulation and Hygiene: Maximal assistance       Functional mobility during ADLs: +2 for physical assistance;Moderate assistance General ADL Comments: reviewed sternal precautions for ADL; mulitple cues required as pt reaching back to brace self on bed     Vision         Perception     Praxis      Pertinent Vitals/Pain Pain Assessment: 0-10(14 with mobility) Pain Score: (14 with mobility) Pain Location: back with mobility Pain Descriptors / Indicators: Grimacing;Guarding;Moaning Pain Intervention(s): Limited activity within patient's tolerance;Premedicated before session;Repositioned;Relaxation     Hand Dominance Right   Extremity/Trunk Assessment Upper Extremity Assessment Upper Extremity Assessment: Generalized weakness   Lower Extremity Assessment Lower Extremity Assessment: Defer to PT evaluation   Cervical / Trunk Assessment Cervical /  Trunk Exceptions: s/p ACDF on 4/12; low back pain   Communication Communication Communication: No difficulties   Cognition Arousal/Alertness: Awake/alert Behavior During Therapy: Flat affect(appears frustrated with his situation) Overall Cognitive Status: No family/caregiver present to determine baseline cognitive functioning                                 General Comments:  impaired judgement; pain and poor sleep most likely affecting cognition   General Comments       Exercises General Exercises - Upper Extremity Shoulder Flexion: 10 reps;AROM;Supine(move "in the tube")   Shoulder Instructions      Home Living Family/patient expects to be discharged to:: Private residence Living Arrangements: Spouse/significant other Available Help at Discharge: Family;Available 24 hours/day Type of Home: House Home Access: Stairs to enter [employer] of Steps: 7 Entrance Stairs-Rails: Can reach both Home Layout: One level     Bathroom Shower/Tub: Occupational psychologist: Handicapped height Bathroom Accessibility: Yes How Accessible: Accessible via walker Home Equipment: Grab bars - tub/shower;Shower seat - built in;Cane - single point;Walker - 2 wheels   Additional Comments: Grab bars in shower with built in seat, no grab bars near toilet  Lives With: Spouse    Prior Functioning/Environment Level of Independence: Independent        Comments: worked as a Tax inspector prior to [employer]        OT Problem List: Decreased strength;Decreased activity tolerance;Impaired balance (sitting and/or standing);Decreased safety awareness;Decreased cognition;Decreased knowledge of use of DME or AE;Cardiopulmonary status limiting activity;Obesity;Pain      OT Treatment/Interventions: Self-care/ADL training;Therapeutic exercise;Neuromuscular education;Energy conservation;DME and/or AE instruction;Therapeutic activities;Cognitive remediation/compensation;Patient/family education;Balance training    OT Goals(Current goals can be found in the care plan section) Acute Rehab OT Goals Patient Stated Goal: to get stronger and go back to work OT Goal Formulation: With patient Time For Goal Achievement: 05/04/20 Potential to Achieve Goals: Good  OT Frequency: Min 2X/week   Barriers to D/C:            Co-evaluation               AM-PAC OT "6 Clicks" Daily Activity     Outcome Measure Help from another person eating meals?: None Help from another person taking care of personal grooming?: A Little Help from another person toileting, which includes using toliet, bedpan, or urinal?: A Lot Help from another person bathing (including washing, rinsing, drying)?: A Lot Help from another person to put on and taking off regular upper body clothing?: A Little Help from another person to put on and taking off regular lower body clothing?: A Lot 6 Click Score: 16   End of Session Nurse Communication: Mobility status;Patient requests pain meds;Precautions  Activity Tolerance: Patient limited by pain Patient left: in bed;with call bell/phone within reach;Other (comment)(working with PT)  OT Visit Diagnosis: Unsteadiness on feet (R26.81);Other abnormalities of gait and mobility (R26.89);Muscle weakness (generalized) (M62.81);Other symptoms and signs involving cognitive function;Pain Pain - part of body: (back)                Time: [id number] OT Time Calculation (min): 31 min Charges:  OT General Charges $OT Visit: 1 Visit OT Evaluation $OT Eval Moderate Complexity: 1 Mod OT Treatments $Self Care/Home Management : 8-22 mins  Maurie Boettcher, OT/L   Acute OT Clinical Specialist Acute Rehabilitation Services Pager [phone] Office [phone]   [facility] 04/20/2020, 11:17 AM

## 2020-04-21 ENCOUNTER — Other Ambulatory Visit: Payer: Self-pay

## 2020-04-21 ENCOUNTER — Inpatient Hospital Stay: Payer: Self-pay

## 2020-04-21 LAB — CBC
HCT: 29.2 % — ABNORMAL LOW (ref 39.0–52.0)
Hemoglobin: 9.1 g/dL — ABNORMAL LOW (ref 13.0–17.0)
MCH: 29.4 pg (ref 26.0–34.0)
MCHC: 31.2 g/dL (ref 30.0–36.0)
MCV: 94.5 fL (ref 80.0–100.0)
Platelets: 229 10*3/uL (ref 150–400)
RBC: 3.09 MIL/uL — ABNORMAL LOW (ref 4.22–5.81)
RDW: 16.4 % — ABNORMAL HIGH (ref 11.5–15.5)
WBC: 10.7 10*3/uL — ABNORMAL HIGH (ref 4.0–10.5)
nRBC: 0 % (ref 0.0–0.2)

## 2020-04-21 LAB — AEROBIC/ANAEROBIC CULTURE W GRAM STAIN (SURGICAL/DEEP WOUND)
Culture: NO GROWTH
Culture: NO GROWTH

## 2020-04-21 LAB — GLUCOSE, CAPILLARY
Glucose-Capillary: 123 mg/dL — ABNORMAL HIGH (ref 70–99)
Glucose-Capillary: 166 mg/dL — ABNORMAL HIGH (ref 70–99)
Glucose-Capillary: 212 mg/dL — ABNORMAL HIGH (ref 70–99)
Glucose-Capillary: 56 mg/dL — ABNORMAL LOW (ref 70–99)
Glucose-Capillary: 74 mg/dL (ref 70–99)
Glucose-Capillary: 76 mg/dL (ref 70–99)
Glucose-Capillary: 78 mg/dL (ref 70–99)

## 2020-04-21 LAB — SURGICAL PATHOLOGY

## 2020-04-21 MED ORDER — INSULIN ASPART 100 UNIT/ML ~~LOC~~ SOLN
0.0000 [IU] | Freq: Three times a day (TID) | SUBCUTANEOUS | Status: DC
  Administered 2020-04-21: 8 [IU] via SUBCUTANEOUS
  Administered 2020-04-23 (×3): 2 [IU] via SUBCUTANEOUS
  Administered 2020-04-24: 4 [IU] via SUBCUTANEOUS
  Administered 2020-04-25: 2 [IU] via SUBCUTANEOUS
  Administered 2020-04-26: 4 [IU] via SUBCUTANEOUS
  Administered 2020-04-27: 8 [IU] via SUBCUTANEOUS
  Administered 2020-04-27 (×2): 2 [IU] via SUBCUTANEOUS
  Administered 2020-04-28: 4 [IU] via SUBCUTANEOUS
  Administered 2020-04-29 – 2020-04-30 (×4): 2 [IU] via SUBCUTANEOUS

## 2020-04-21 MED ORDER — METOPROLOL TARTRATE 5 MG/5ML IV SOLN
5.0000 mg | Freq: Once | INTRAVENOUS | Status: AC
  Administered 2020-04-21: 5 mg via INTRAVENOUS
  Filled 2020-04-21: qty 5

## 2020-04-21 MED ORDER — INSULIN DETEMIR 100 UNIT/ML ~~LOC~~ SOLN
8.0000 [IU] | Freq: Every day | SUBCUTANEOUS | Status: DC
  Administered 2020-04-21 – 2020-04-30 (×9): 8 [IU] via SUBCUTANEOUS
  Filled 2020-04-21 (×11): qty 0.08

## 2020-04-21 MED ORDER — METOPROLOL TARTRATE 12.5 MG HALF TABLET
12.5000 mg | ORAL_TABLET | Freq: Two times a day (BID) | ORAL | Status: DC
  Administered 2020-04-21 – 2020-04-27 (×13): 12.5 mg via ORAL
  Filled 2020-04-21 (×13): qty 1

## 2020-04-21 MED ORDER — LACTULOSE 10 GM/15ML PO SOLN
20.0000 g | Freq: Once | ORAL | Status: AC
  Administered 2020-04-21: 20 g via ORAL
  Filled 2020-04-21: qty 30

## 2020-04-21 MED ORDER — INSULIN DETEMIR 100 UNIT/ML ~~LOC~~ SOLN
10.0000 [IU] | Freq: Every day | SUBCUTANEOUS | Status: DC
  Filled 2020-04-21: qty 0.1

## 2020-04-21 NOTE — Progress Notes (Signed)
Plan of care reviewed. Pt's progressing. He's hemodynamically stable. Remained afebrile, NSR on monitor, HR 60s-70s, BP 119/ 64 - 159/ 82 mmHg, RR 15-22, SPO2 99-100% on room air. No acute distress noted tonight. Sternal incision dry and clean, no drainage. Pain tolerated well. Pt had constipation. Per RN day shift reported Dulcolax suppository and Colace already given on day shift. Pt stated last BM on 5/30th small one, + flatus, good and active bowel sound.  No immediate distress noted tonight. We will continue to monitor.  Kennyth Lose, RN

## 2020-04-21 NOTE — Progress Notes (Signed)
Physical Therapy Treatment Patient Details Name: Johnathan Murray MRN: [id number] DOB: [birth year] Today's Date: 04/21/2020    History of Present Illness Pt transferred from [place] on 5/24 after he developed afib with RVR and found to have new aortic root abscess with vegetations and prosthetic aortic valve endocarditis. Underwent  aortic root replacement on 04/16/20. Pt originally admitted 4/30 with acute discitis L4-5 and bil septic knees. Transferred to CIR on 5/18. PMH - ACDF on 4/12, back surg 05/2019, CAD, CABG, DM, HTN.    PT Comments    Patient progressing slowly towards PT goals. Highly motivated to mobilize but limited mainly by pain despite being premedicated with morphine. Requires assist of 2 for multiple standing bouts in stedy. Pt not able to stand fully upright due to worsening back pain. Used stedy to get pt to toilet; reports constipation. Pt went into A-fib at end of session with HR up to 161 bpm. Pt reports feeling flutters in his heart. RN made aware. Pt requests planning PT ~1 hour after morphine for optimal pain relief and activity tolerance, if possible. Will continue to follow and progress as tolerated.   Follow Up Recommendations  CIR     Equipment Recommendations  Other (comment)(defer)    Recommendations for Other Services       Precautions / Restrictions Precautions Precautions: Sternal;Fall;Cervical Precaution Comments: ACDF on 4/12, pt reports they d/c-ed collar 1 week after surgery Required Braces or Orthoses: Spinal Brace Spinal Brace: Lumbar corset Other Brace: for comfort, no brace in room Restrictions Weight Bearing Restrictions: Yes(sternal precautions)    Mobility  Bed Mobility Overal bed mobility: Needs Assistance Bed Mobility: Rolling;Sidelying to Sit;Sit to Sidelying Rolling: Min guard Sidelying to sit: Min assist;HOB elevated     Sit to sidelying: HOB elevated;Mod assist General bed mobility comments: Able to roll towards left, assist  with trunk to get to EOB. Assist to bring LEs into bed to return to supine. Back pain improved with leaning back.  Transfers Overall transfer level: Needs assistance Equipment used: Ambulation equipment used Transfers: Sit to/from Stand Sit to Stand: +2 physical assistance;Mod assist;From elevated surface;Max assist         General transfer comment: Assist of 2 to stand from EOB x2, from stedy seat x3, from toilet x1, more assist needed from lower surfaces and towards end of session when fatigued and in pain.  Ambulation/Gait                 Stairs             Wheelchair Mobility    Modified Rankin (Stroke Patients Only)       Balance Overall balance assessment: Needs assistance Sitting-balance support: Feet supported;Bilateral upper extremity supported Sitting balance-Leahy Scale: Poor Sitting balance - Comments: Pt requires support due to back pain Postural control: Posterior lean Standing balance support: During functional activity Standing balance-Leahy Scale: Poor Standing balance comment: able to stand in stedy with Min guard-Min A; leaning forward on stedy bar despite cues for upright trunk. Able to perform mini marches in standing with Min A.                            Cognition Arousal/Alertness: Awake/alert Behavior During Therapy: WFL for tasks assessed/performed Overall Cognitive Status: No family/caregiver present to determine baseline cognitive functioning  General Comments: Follows simple commands. Distracted by pain.      Exercises      General Comments General comments (skin integrity, edema, etc.): Pt went into A-fib post session, with HR up to 161 bpm max. RN notified.      Pertinent Vitals/Pain Pain Assessment: 0-10 Pain Score: 10-Worst pain ever Pain Location: back with mobility Pain Descriptors / Indicators: Grimacing;Guarding;Moaning;Sore;Discomfort Pain  Intervention(s): Limited activity within patient's tolerance;Monitored during session;Premedicated before session;Repositioned    Home Living                      Prior Function            PT Goals (current goals can now be found in the care plan section) Progress towards PT goals: Progressing toward goals    Frequency    Min 3X/week      PT Plan Current plan remains appropriate    Co-evaluation              AM-PAC PT "6 Clicks" Mobility   Outcome Measure  Help needed turning from your back to your side while in a flat bed without using bedrails?: A Little Help needed moving from lying on your back to sitting on the side of a flat bed without using bedrails?: A Little Help needed moving to and from a bed to a chair (including a wheelchair)?: Total Help needed standing up from a chair using your arms (e.g., wheelchair or bedside chair)?: Total Help needed to walk in hospital room?: Total Help needed climbing 3-5 steps with a railing? : Total 6 Click Score: 10    End of Session Equipment Utilized During Treatment: Gait belt Activity Tolerance: Patient limited by pain Patient left: in bed;with call bell/phone within reach;with bed alarm set Nurse Communication: Mobility status;Need for lift equipment PT Visit Diagnosis: Other abnormalities of gait and mobility (R26.89);Muscle weakness (generalized) (M62.81);Pain Pain - part of body: (back)     Time: 1002-1030 PT Time Calculation (min) (ACUTE ONLY): 28 min  Charges:  $Therapeutic Activity: 23-37 mins                     Marisa Severin, PT, DPT Acute Rehabilitation Services Pager [phone] Office [phone]       Marguarite Arbour A Sabra Heck 04/21/2020, 12:50 PM

## 2020-04-21 NOTE — Progress Notes (Signed)
New onset A-fib.  On-call notified.  New orders received- lopressor 5 mg IV now, lopressor 12.5 mg PO bid. Patient noted with "allergy" to bystolic with side effect of diarrhea.  Patient unaware of any allergy or diarrhea from that medicine.  MD notified.  Give anyway, can adjust if needed later.

## 2020-04-21 NOTE — Progress Notes (Signed)
Results for SAMVEL, STURDY (MRN [id number]) as of 04/21/2020 04:46  Ref. Range 04/20/2020 16:08 04/20/2020 20:33 04/21/2020 00:13 04/21/2020 04:19 04/21/2020 04:30  Glucose-Capillary Latest Ref Range: 70 - 99 mg/dL 111 (H) 79 74  56 (L)    Hypoglycemic standing order initiated. Encouraged increasing oral intake. Pt agreed to drink orange juice and Ensure, ones serving each before bedtime and at 4 am. Pt's symptomatic, diaphoresis and feeling lethargy.  Vital remained stable. We will continue to monitor.  Kennyth Lose, RN

## 2020-04-21 NOTE — Progress Notes (Signed)
Mobility Specialist: Progress Note    04/21/20 1655  Mobility  Activity Transferred to/from [facility]  Level of Assistance Maximum assist, patient does 25-49%  Assistive Device BSC  Mobility Response Tolerated fair  Mobility performed by Mobility specialist  $Mobility charge 1 Mobility   Pre-Mobility: 66 HR, 145/83 BP, 96% SpO2  Pt tolerated transfer fair. Pt was transferred to [facility] and then back to bed. Pt endorsed back pain and nurse was notified. Pt was positioned back in bed with call bell and phone within reach.   [facility] Janyia Guion Mobility Specialist

## 2020-04-21 NOTE — Progress Notes (Signed)
At patient bedside to place PICC.  Consent obtained earlier this afternoon.  Patient is sleepy and states that he would like to wait until tomorrow since he has not gotten much sleep.  Patient's wife confirms that he is very tired.  Patient's bedside nurse updated and agreeable as patient has adequate IV access.  Patient is aware that if current IV stops working, he will have to be stuck for new IV.

## 2020-04-21 NOTE — Progress Notes (Addendum)
      [address]       [place] [address]             [phone]        5 Days Post-Op Procedure(s) (LRB): REDO STERNOTOMY (N/A) ASCENDING AORTIC ROOT REPLACEMENT - HOMOGRAFT using [employer] [facility] 23 MM Cryo Aortic Valve. (N/A) TRANSESOPHAGEAL ECHOCARDIOGRAM (TEE) (N/A)  Subjective: Patient with complaints of severe constipation this am. He also does not have much appetite and it is harder for him to urinate (last did so around 4 am)  because he is so constipated.  Objective: Vital signs in last 24 hours: Temp:  [97.5 F (36.4 C)-98 F (36.7 C)] 97.6 F (36.4 C) (06/01 0427) Pulse Rate:  [61-74] 74 (06/01 0427) Cardiac Rhythm: Normal sinus rhythm (06/01 0427) Resp:  [12-21] 12 (06/01 0427) BP: (110-159)/(59-108) 141/75 (06/01 0427) SpO2:  [96 %-100 %] 97 % (06/01 0427) Weight:  [91.4 kg] 91.4 kg (06/01 0427)  Pre op weight 78.7 kg Current Weight  04/21/20 91.4 kg      Intake/Output from previous day: 05/31 0701 - 06/01 0700 In: 2180 [P.O.:1540; I.V.:240; IV Piggyback:400] Out: 2860 [Urine:2860]   Physical Exam:  Cardiovascular: RRR, no murmur Pulmonary: Slightly diminished bibasilar breath sounds Abdomen: Soft, non tender, bowel sounds present. Extremities: Mild bilateral lower extremity edema. Wounds: Clean and dry.  No erythema or signs of infection.  Lab Results: CBC: Recent Labs    04/19/20 0306 04/21/20 0419  WBC 13.8* 10.7*  HGB 9.6* 9.1*  HCT 29.5* 29.2*  PLT 170 229   BMET:  Recent Labs    04/19/20 0306 04/20/20 1014  NA 138 138  K 4.5 4.4  CL 100 103  CO2 27 27  GLUCOSE 72 143*  BUN 26* 19  CREATININE 1.51* 1.39*  CALCIUM 8.3* 8.5*    PT/INR:  Lab Results  Component Value Date   INR 0.9 04/16/2020   INR 1.8 (H) 03/20/2020   INR 1.2 03/02/2020   ABG:  INR: Will add last result for INR, ABG once components are confirmed Will add last 4 CBG results once components are confirmed  Assessment/Plan:  1. CV  - Previous a fib. First degree heart block, SR with HR in the 80's. On Amiodarone 200 mg bid 2.  Pulmonary - On room air. Encourage incentive spirometer 3. Volume Overload - On Lasix 40 mg daily. I doubt his weight is correct as he has been negative I/O for most of his post op course 4. DM-CBGs 74/56/78. On Insulin but will decrease to avoid further hypogylcemia. Pre op HGA1C 6.8. 5. Expected acute blood loss anemia-H and H this am stable at 9.1 and 29.2. Continue Trinsicon 6. Creatinine yesterday down to 1.39. Will recheck in am 7. ID-on Ampicillin and Ceftriaxone for disseminated Enterococcus Faecalis 8. Hypothyroidism Levothyroxine 25 mcg daily 9. LOC constipation  Donielle M ZimmermanPA-C 04/21/2020,7:21 AM   Chart reviewed, patient examined, agree with above. Main complaint now is constipation. Received lactulose today but no BM yet. May need to repeat tomorrow or try warm water enema. Will have PICC line inserted tomorrow. CIR consult in progress

## 2020-04-21 NOTE — Progress Notes (Signed)
[facility] for Infectious Disease  Date of Admission:  04/13/2020            ASSESSMENT:  Mr. Fjeld is POD #5 from redo sternotomy and remains clinically stable without fever in the setting of disseminated enterococcal infection. Surgical cultures finalized without growth. Will need prolonged course of at least 6 weeks of therapy for discitis and prosthetic valve endocarditis. Based on date of surgery of 5/27 this will place estimated end date of 05/28/20.  Continue current dose of ampicillin and ceftriaxone. [place] for PICC line placement. Continues to have issues with constipation and received lactulose today.   PLAN:  1. Continue ampicillin and ceftriaxone. 2. Prolonged course of 6 weeks of therapy for disseminated infection. 3. Constipation management per primary team.   Active Problems:   Septic arthritis ([place])   S/P AVR   Bacteremia due to Enterococcus   Pseudogout of left knee   Discitis   Prosthetic valve endocarditis (HCC)   Aortic valve abscess   Endocarditis   . sodium chloride   Intravenous Once  . acetaminophen  1,000 mg Oral Q6H   Or  . acetaminophen (TYLENOL) oral liquid 160 mg/5 mL  1,000 mg Per Tube Q6H  . amiodarone  200 mg Oral BID  . vitamin C  500 mg Oral Daily  . aspirin EC  325 mg Oral Daily   Or  . aspirin  324 mg Per Tube Daily  . bisacodyl  10 mg Oral Daily   Or  . bisacodyl  10 mg Rectal Daily  . chlorhexidine gluconate (MEDLINE KIT)  15 mL Mouth Rinse BID  . Chlorhexidine Gluconate Cloth  6 each Topical Daily  . docusate sodium  200 mg Oral Daily  . enoxaparin (LOVENOX) injection  40 mg Subcutaneous QHS  . feeding supplement (ENSURE ENLIVE)  237 mL Oral TID BM  . ferrous [id number] C-folic acid  1 capsule Oral BID PC  . furosemide  40 mg Oral Daily  . insulin aspart  0-24 Units Subcutaneous TID WC  . insulin detemir  8 Units Subcutaneous Daily  . levothyroxine  25 mcg Oral QAC breakfast  . mouth rinse  15 mL Mouth Rinse BID  .  metolazone  2.5 mg Oral Daily  . metoprolol tartrate  12.5 mg Oral BID  . oxyCODONE  10 mg Oral Q12H  . pantoprazole  40 mg Oral Daily  . pregabalin  100 mg Oral TID  . sodium chloride flush  10-40 mL Intracatheter Q12H  . sodium chloride flush  3 mL Intravenous Q12H  . sodium chloride flush  3 mL Intravenous Q12H    SUBJECTIVE:  Afebrile overnight with no acute events. Constipated and working on having a bowel movement. Tired after working with rehabilitation this morning.   Allergies  Allergen Reactions  . Atorvastatin Other (See Comments)    Urinary retention   . Nebivolol Diarrhea     Review of Systems: Review of Systems  Constitutional: Negative for chills, fever and weight loss.  Respiratory: Negative for cough, shortness of breath and wheezing.   Cardiovascular: Negative for chest pain and leg swelling.  Gastrointestinal: Positive for constipation. Negative for abdominal pain, diarrhea, nausea and vomiting.  Musculoskeletal: Positive for back pain.  Skin: Negative for rash.      OBJECTIVE: Vitals:   04/21/20 0427 04/21/20 0803 04/21/20 1100 04/21/20 1137  BP: (!) 141/75 (!) 156/85  133/80  Pulse: 74 69 (!) 110 82  Resp: _0 13  Temp: 97.6 F (36.4 C) 98 F (36.7 C)  98.1 F (36.7 C)  TempSrc: Oral Tympanic  Tympanic  SpO2: 97% 100% 96% 96%  Weight: 91.4 kg     Height:       Body mass index is 28.91 kg/m.  Physical Exam Constitutional:      General: He is not in acute distress.    Appearance: He is well-developed.     Comments: Lying in bed with head of bed elevated; pleasant.   Cardiovascular:     Rate and Rhythm: Normal rate and regular rhythm.     Heart sounds: Normal heart sounds.  Pulmonary:     Effort: Pulmonary effort is normal.     Breath sounds: Normal breath sounds.  Skin:    General: Skin is warm and dry.  Neurological:     Mental Status: He is alert and oriented to person, place, and time.  Psychiatric:        Behavior:  Behavior normal.        Thought Content: Thought content normal.        Judgment: Judgment normal.     Lab Results Lab Results  Component Value Date   WBC 10.7 (H) 04/21/2020   HGB 9.1 (L) 04/21/2020   HCT 29.2 (L) 04/21/2020   MCV 94.5 04/21/2020   PLT 229 04/21/2020    Lab Results  Component Value Date   CREATININE 1.39 (H) 04/20/2020   BUN 19 04/20/2020   NA 138 04/20/2020   K 4.4 04/20/2020   CL 103 04/20/2020   CO2 27 04/20/2020    Lab Results  Component Value Date   ALT 68 (H) 04/15/2020   AST 37 04/15/2020   ALKPHOS 92 04/15/2020   BILITOT 0.6 04/15/2020     Microbiology: Recent Results (from the past 240 hour(s))  Culture, Urine     Status: None   Collection Time: 04/11/20 10:45 PM   Specimen: Urine, Clean Catch  Result Value Ref Range Status   Specimen Description URINE, CLEAN CATCH  Final   Special Requests NONE  Final   Culture   Final    NO GROWTH Performed at [facility], [address], [place], [place] [address]    Report Status 04/13/2020 FINAL  Final  Surgical pcr screen     Status: None   Collection Time: 04/15/20  2:39 AM   Specimen: Nasal Mucosa; Nasal Swab  Result Value Ref Range Status   MRSA, PCR NEGATIVE NEGATIVE Final   Staphylococcus aureus NEGATIVE NEGATIVE Final    Comment: (NOTE) The Xpert SA Assay (FDA approved for NASAL specimens in patients 8 years of age and older), is one component of a comprehensive surveillance program. It is not intended to diagnose infection nor to guide or monitor treatment. Performed at [facility], [place] [address], [place], [place] [address]   Culture, blood (Routine X 2) w Reflex to ID Panel     Status: None   Collection Time: 04/15/20 11:59 AM   Specimen: BLOOD LEFT WRIST  Result Value Ref Range Status   Specimen Description BLOOD LEFT WRIST  Final   Special Requests   Final    BOTTLES DRAWN AEROBIC AND ANAEROBIC Blood Culture adequate volume   Culture   Final    NO GROWTH 5  DAYS Performed at [facility], [place] [address] [place], [place] [address]    Report Status 04/20/2020 FINAL  Final  Culture, blood (Routine X 2) w  Reflex to ID Panel     Status: None   Collection Time: 04/15/20 12:04 PM   Specimen: BLOOD LEFT HAND  Result Value Ref Range Status   Specimen Description BLOOD LEFT HAND  Final   Special Requests   Final    BOTTLES DRAWN AEROBIC AND ANAEROBIC Blood Culture adequate volume   Culture   Final    NO GROWTH 5 DAYS Performed at [facility], [address], [place], [place] [address]    Report Status 04/20/2020 FINAL  Final  Aerobic/Anaerobic Culture (surgical/deep wound)     Status: None   Collection Time: 04/16/20  1:08 PM   Specimen: Aortic Valve Tissue  Result Value Ref Range Status   Specimen Description AORTIC VALVE TISSUE  Final   Special Requests AORTIC FELT SPECIMEN A  Final   Gram Stain   Final    RARE WBC PRESENT,BOTH PMN AND MONONUCLEAR NO ORGANISMS SEEN    Culture   Final    No growth aerobically or anaerobically. Performed at [facility], [place] [address], [place], [place] [address]    Report Status 04/21/2020 FINAL  Final  Fungus Culture With Stain     Status: None (Preliminary result)   Collection Time: 04/16/20  1:08 PM  Result Value Ref Range Status   Fungus Stain Final report  Final    Comment: (NOTE) Performed At: [facility] [place], [place] [id number] Rush Farmer MD SJ:[id number]    Fungus (Mycology) Culture PENDING  Incomplete   Fungal Source AORTIC VALVE TISSUE  Final    Comment: SPECIMEN B  Aerobic/Anaerobic Culture (surgical/deep wound)     Status: None   Collection Time: 04/16/20  1:08 PM   Specimen: Aortic Valve Tissue  Result Value Ref Range Status   Specimen Description AORTIC VALVE TISSUE  Final   Special Requests SPECIMEN B  Final   Gram Stain   Final    FEW WBC PRESENT, PREDOMINANTLY PMN NO ORGANISMS SEEN    Culture   Final    No growth  aerobically or anaerobically. Performed at [facility], [place] [address] [place],  [address]    Report Status 04/21/2020 FINAL  Final  Acid Fast Smear (AFB)     Status: None (Preliminary result)   Collection Time: 04/16/20  1:08 PM  Result Value Ref Range Status   AFB Specimen Processing Concentration  Final   Acid Fast Smear Negative  Final    Comment: (NOTE) Performed At: [facility] [place], [place] [id number] Rush Farmer MD EX:[id number]    Source (AFB) PENDING  Incomplete  Fungus Culture Result     Status: None   Collection Time: 04/16/20  1:08 PM  Result Value Ref Range Status   Result 1 Comment  Final    Comment: (NOTE) KOH/Calcofluor preparation:  no fungus observed. Performed At: [facility] [address] [place], [place] [id number] Rush Farmer MD WG:[id number]      Terri Piedra, [facility] Infectious [facility]  04/21/2020  3:14 PM

## 2020-04-21 NOTE — Progress Notes (Signed)
Inpatient Rehab Admissions:  Inpatient Rehab Consult received.  I attempted x2 to meet with pt.  On first attempt pt attempting to toilet, on second attempt speaking with admissions nurse.  Will f/u tomorrow.   Signed: Shann Medal, PT, DPT Admissions Coordinator [phone] 04/21/20  2:49 PM

## 2020-04-21 NOTE — Progress Notes (Signed)
PHARMACY CONSULT NOTE FOR:  OUTPATIENT  PARENTERAL ANTIBIOTIC THERAPY (OPAT)  Indication: Enterococcal endocarditis Regimen: Ampicillin 12 gm every 24 hours as a continuous infusion + ceftriaxone 2 gm q 12 hours  End date: 05/28/2020  IV antibiotic discharge orders are pended. To discharging provider:  please sign these orders via discharge navigator,  Select New Orders & click on the button choice - Manage This Unsigned Work.     Thank you for allowing pharmacy to be a part of this patient's care.  Jimmy Footman, PharmD, BCPS, BCIDP Infectious Diseases Clinical Pharmacist Phone: [phone] 04/21/2020, 11:19 AM

## 2020-04-22 ENCOUNTER — Ambulatory Visit: Payer: BC Managed Care – PPO | Admitting: Cardiology

## 2020-04-22 LAB — BASIC METABOLIC PANEL
Anion gap: 14 (ref 5–15)
BUN: 22 mg/dL (ref 8–23)
CO2: 27 mmol/L (ref 22–32)
Calcium: 8.9 mg/dL (ref 8.9–10.3)
Chloride: 99 mmol/L (ref 98–111)
Creatinine, Ser: 1.33 mg/dL — ABNORMAL HIGH (ref 0.61–1.24)
GFR calc Af Amer: >60 mL/min (ref >60)
GFR calc non Af Amer: 55 mL/min — ABNORMAL LOW (ref >60)
Glucose, Bld: 116 mg/dL — ABNORMAL HIGH (ref 70–99)
Potassium: 4.1 mmol/L (ref 3.5–5.1)
Sodium: 140 mmol/L (ref 135–145)

## 2020-04-22 LAB — GLUCOSE, CAPILLARY
Glucose-Capillary: 104 mg/dL — ABNORMAL HIGH (ref 70–99)
Glucose-Capillary: 110 mg/dL — ABNORMAL HIGH (ref 70–99)
Glucose-Capillary: 132 mg/dL — ABNORMAL HIGH (ref 70–99)
Glucose-Capillary: 132 mg/dL — ABNORMAL HIGH (ref 70–99)
Glucose-Capillary: 160 mg/dL — ABNORMAL HIGH (ref 70–99)
Glucose-Capillary: 71 mg/dL (ref 70–99)

## 2020-04-22 MED ORDER — SODIUM CHLORIDE 0.9% FLUSH
10.0000 mL | Freq: Two times a day (BID) | INTRAVENOUS | Status: DC
  Administered 2020-04-22 – 2020-04-30 (×8): 10 mL

## 2020-04-22 MED ORDER — POTASSIUM CHLORIDE CRYS ER 20 MEQ PO TBCR
40.0000 meq | EXTENDED_RELEASE_TABLET | Freq: Once | ORAL | Status: AC
  Administered 2020-04-22: 40 meq via ORAL
  Filled 2020-04-22: qty 2

## 2020-04-22 MED ORDER — DIPHENHYDRAMINE HCL 25 MG PO CAPS
25.0000 mg | ORAL_CAPSULE | Freq: Every day | ORAL | Status: DC
  Administered 2020-04-22 – 2020-04-29 (×8): 25 mg via ORAL
  Filled 2020-04-22 (×8): qty 1

## 2020-04-22 MED ORDER — MINERAL OIL RE ENEM
1.0000 | ENEMA | Freq: Once | RECTAL | Status: AC
  Administered 2020-04-22: 1 via RECTAL
  Filled 2020-04-22: qty 1

## 2020-04-22 MED ORDER — POLYETHYLENE GLYCOL 3350 17 G PO PACK
17.0000 g | PACK | Freq: Every day | ORAL | Status: DC
  Administered 2020-04-22 – 2020-04-30 (×7): 17 g via ORAL
  Filled 2020-04-22 (×8): qty 1

## 2020-04-22 MED ORDER — SODIUM CHLORIDE 0.9% FLUSH: 10.0000 mL | INTRAVENOUS | Status: DC | PRN

## 2020-04-22 NOTE — Progress Notes (Signed)
Mobility Specialist: Progress Note    04/22/20 1646  Mobility  Activity Dangled on edge of bed  Level of Assistance Minimal assist, patient does 75% or more  Assistive Device None  Mobility Response Tolerated fair  Mobility performed by Mobility specialist  $Mobility charge 1 Mobility   Pre-Mobility: 80 HR, 113/83 BP, 99% SpO2 Post-Mobility: 84 HR, 135/65 BP, 99% SpO2  Pt tolerated dangling on the EOB fair. Pt c/o of lower back and L hip pain while on EOB. Pt dangled for 2 minutes and had to lay back down because of the pain. Pt states he wants to try to stand up tomorrow and possibly do sit to stands. Pt was positioned back in bed with call bell and phone within reach and family in the room.   [facility] Kasiyah Platter Mobility Specialist

## 2020-04-22 NOTE — Progress Notes (Signed)
Peripherally Inserted Central Catheter Placement  The IV Nurse has discussed with the patient and/or persons authorized to consent for the patient, the purpose of this procedure and the potential benefits and risks involved with this procedure.  The benefits include less needle sticks, lab draws from the catheter, and the patient may be discharged home with the catheter. Risks include, but not limited to, infection, bleeding, blood clot (thrombus formation), and puncture of an artery; nerve damage and irregular heartbeat and possibility to perform a PICC exchange if needed/ordered by physician.  Alternatives to this procedure were also discussed.  Bard Power PICC patient education guide, fact sheet on infection prevention and patient information card has been provided to patient /or left at bedside.    PICC Placement Documentation  PICC Single Lumen 04/22/20 PICC Right Brachial 41 cm 0 cm (Active)  Indication for Insertion or Continuance of Line Home intravenous therapies (PICC only) 04/22/20 2113  Exposed Catheter (cm) 0 cm 04/22/20 2113  Site Assessment Clean;Dry;Intact 04/22/20 2113  Line Status Flushed;Saline locked;Blood return noted 04/22/20 2113  Dressing Type Transparent 04/22/20 2113  Dressing Status Clean;Dry;Intact;Antimicrobial disc in place 04/22/20 2113  Safety Lock Not Applicable [id number]  Dressing Intervention New dressing 04/22/20 2113  Dressing Change Due 04/29/20 04/22/20 2113       [facility] Payment 04/22/2020, 9:16 PM

## 2020-04-22 NOTE — Progress Notes (Signed)
Subjective:   Patient is alert, oriented. Reports severe pain in low back, but no knee pain today. Patient is very happy that he was able to stand up on his own yesterday.    Objective:  PE: VITALS:   Vitals:   04/22/20 0002 04/22/20 0308 04/22/20 0835 04/22/20 1239  BP: 118/71 (!) 142/82 (!) 157/89 (!) 152/88  Pulse: (!) 56 74 83 80  Resp: 13 15 15 20   Temp: 97.9 F (36.6 C) 97.7 F (36.5 C) 97.6 F (36.4 C) 98.4 F (36.9 C)  TempSrc: Oral Oral Oral Oral  SpO2: 99% 97% 98% 100%  Weight:  88.7 kg    Height:       General: Laying comfortably in bed, in no acute distress MSK: 0-90 deg AROM left knee, 0-100 AROM righ knee while supine in bed. Edema at distal femur bilaterally - left worse than right, though appears improved over hospital course. No TTP to lateral or medial joint line of either knee. EHL and FHL intact bilaterally. 2+ DP pulses. Distal sensation intact bilaterally. Arthroscopic surgical sites healed.  LABS  Results for orders placed or performed during the hospital encounter of 04/13/20 (from the past 24 hour(s))  Glucose, capillary     Status: None   Collection Time: 04/21/20  4:23 PM  Result Value Ref Range   Glucose-Capillary 76 70 - 99 mg/dL   Comment 1 Notify RN   Glucose, capillary     Status: Abnormal   Collection Time: 04/21/20  9:38 PM  Result Value Ref Range   Glucose-Capillary 166 (H) 70 - 99 mg/dL  Glucose, capillary     Status: None   Collection Time: 04/22/20 12:05 AM  Result Value Ref Range   Glucose-Capillary 71 70 - 99 mg/dL  Glucose, capillary     Status: Abnormal   Collection Time: 04/22/20  4:03 AM  Result Value Ref Range   Glucose-Capillary 132 (H) 70 - 99 mg/dL  Glucose, capillary     Status: Abnormal   Collection Time: 04/22/20  8:32 AM  Result Value Ref Range   Glucose-Capillary 104 (H) 70 - 99 mg/dL  Basic metabolic panel     Status: Abnormal   Collection Time: 04/22/20  8:54 AM  Result Value Ref Range   Sodium 140  135 - 145 mmol/L   Potassium 4.1 3.5 - 5.1 mmol/L   Chloride 99 98 - 111 mmol/L   CO2 27 22 - 32 mmol/L   Glucose, Bld 116 (H) 70 - 99 mg/dL   BUN 22 8 - 23 mg/dL   Creatinine, Ser 1.33 (H) 0.61 - 1.24 mg/dL   Calcium 8.9 8.9 - 10.3 mg/dL   GFR calc non Af Amer 55 (L) >60 mL/min   GFR calc Af Amer >60 >60 mL/min   Anion gap 14 5 - 15  Glucose, capillary     Status: Abnormal   Collection Time: 04/22/20 11:45 AM  Result Value Ref Range   Glucose-Capillary 110 (H) 70 - 99 mg/dL    [place] EKG SITE RITE  Result Date: 04/21/2020 If Site Rite image not attached, placement could not be confirmed due to current cardiac rhythm.   Assessment/Plan: Active Problems:   S/P AVR   Bacteremia due to Enterococcus   Pseudogout of left knee   Septic arthritis (HCC)   Discitis   Prosthetic valve endocarditis (HCC)   Aortic valve abscess   Endocarditis  Bilateral knee pain, septic arthritis s/p bilateral knee irrigation and debridement -  5/1 - left knee aspiration performed whichshowedintracellular calcium pyrophosphate crystals withwhite blood cell count approx.15,000consistent with pseudogout - 5/2 - right knee aspirated due to report of right knee pain and swelling, and bilateral knees received cortisone injection due to results above - this aspiration showed intracellular calcium pyrophosphate crystals and white blood cell count approx. 19,000 - 5/3 - left knee culturegrewout Enterococcus faecalis, no growth from right knee aspirate  - 5/2 - bilateral knee irrigation and debridement performed - 5/19 knee aspirated again due to left knee swelling, only able to aspirate 4 ml  - final results of this aspiration showed no organisms, white blood cell count approx. 26,000 and intra and extra cellular calcium pyrophosphate crystals - continue to WBAT bilateral knees, continue working with PT. Patient with significant motivation to increase his mobility. - will continue to follow intermittently    Ventura Bruns 04/22/2020, 1:41 PM   Contact information:   Weekdays [address] PA-C [phone] A fter hours and holidays please check [email or web address] for group call information for [facility]

## 2020-04-22 NOTE — Progress Notes (Signed)
Inpatient Rehab Admissions:  Inpatient Rehab Consult received.  I met with patient and his wife at the bedside for rehabilitation assessment and to discuss goals and expectations of an inpatient rehab admission.  Both are hopeful to return to [facility] pending insurance authorization and bed availability.  Signed: Shann Medal, PT, DPT Admissions Coordinator [phone] 04/22/20  5:09 PM

## 2020-04-22 NOTE — Progress Notes (Signed)
Extremely large firm bowel movement this am.

## 2020-04-22 NOTE — Progress Notes (Signed)
Went to patient to place bedside PICC. Patient state " I don't feel good right now and cannot stay still while doing this procedure". Will come back whenever patient is ready for PICC placement. RN made aware.

## 2020-04-22 NOTE — Progress Notes (Addendum)
[address]       [place] [address]             [phone]        6 Days Post-Op Procedure(s) (LRB): REDO STERNOTOMY (N/A) ASCENDING AORTIC ROOT REPLACEMENT - HOMOGRAFT using [employer] 23 MM Cryo Aortic Valve. (N/A) TRANSESOPHAGEAL ECHOCARDIOGRAM (TEE) (N/A)  Subjective: Patient had some bowel movements (small and hard). He thinks he might need disimpaction. He did not sleep much last night because of going to the bathroom.  Objective: Vital signs in last 24 hours: Temp:  [97.7 F (36.5 C)-98.4 F (36.9 C)] 97.7 F (36.5 C) (06/02 0308) Pulse Rate:  [56-110] 74 (06/02 0308) Cardiac Rhythm: Normal sinus rhythm (06/02 0400) Resp:  [11-16] 15 (06/02 0308) BP: (118-156)/(67-85) 142/82 (06/02 0308) SpO2:  [95 %-100 %] 97 % (06/02 0308) Weight:  [88.7 kg] 88.7 kg (06/02 0308)  Pre op weight 78.7 kg Current Weight  04/22/20 88.7 kg      Intake/Output from previous day: 06/01 0701 - 06/02 0700 In: 740 [P.O.:240; IV Piggyback:500] Out: 3660 [Urine:3660]   Physical Exam:  Cardiovascular: RRR, no murmur Pulmonary: Slightly diminished bibasilar breath sounds Abdomen: Soft, non tender, bowel sounds present. Extremities: Mild bilateral lower extremity edema. Wounds: Clean and dry.  No erythema or signs of infection.  Lab Results: CBC: Recent Labs    04/21/20 0419  WBC 10.7*  HGB 9.1*  HCT 29.2*  PLT 229   BMET:  Recent Labs    04/20/20 1014  NA 138  K 4.4  CL 103  CO2 27  GLUCOSE 143*  BUN 19  CREATININE 1.39*  CALCIUM 8.5*    PT/INR:  Lab Results  Component Value Date   INR 0.9 04/16/2020   INR 1.8 (H) 03/20/2020   INR 1.2 03/02/2020   ABG:  INR: Will add last result for INR, ABG once components are confirmed Will add last 4 CBG results once components are confirmed  Assessment/Plan:  1. CV - Previous a fib. He had a fib yesterday. First degree heart block, SR with HR in the 80's this am. On Amiodarone 200 mg  bid and started on Lopressor 12.5 mg bid. May need to consider restarting Apixaban 2.  Pulmonary - On room air. Encourage incentive spirometer 3. Volume Overload - On Lasix 40 mg daily and Metolazone 2.5 mg daily. 4. DM-CBGs 166/71/132. On Insulin. Pre op HGA1C 6.8. 5. Expected acute blood loss anemia-H and H yesterday stable at 9.1 and 29.2. Continue Trinsicon 6. Creatinine yesterday down to 1.39. BMET ordered but PICC line to be placed  7. ID-on Ampicillin and Ceftriaxone for disseminated Enterococcus Faecalis 8. Hypothyroidism Levothyroxine 25 mcg daily 9. Will try to disimpact patient; may need another laxative 10. Patient unable to walk much as has significant back pain;Spinal osteo/discitis with small psoas abscesses and probable myositis in paraspinous muscles related to enterococcal bacteremia.May need to ask [name] to evaluate  [facility] M ZimmermanPA-C 04/22/2020,7:17 AM    Chart reviewed, patient examined, agree with above. He was - 3L yesterday and wt down 6 lbs. He is close to preop baseline but will give one more dose of diuretic today and then stop. Check BMET later today after PICC line placed.  Main issue at this time is constipation. May need disimpaction. He is going to continue to have pain from spinal disease and discitis/osteo. I had already discussed with [name] and he did not think there was anything  else to do at this point but continue antibiotics and pain meds. He needs continued attention to bowel function. Will add miralax daily. Rhythm has been sinus with one episode of atrial fib yesterday since surgery. Will hold off on anticoagulation for now given his diagnosis.

## 2020-04-22 NOTE — Progress Notes (Signed)
OT Cancellation Note  Patient Details Name: Keyron Rosasco MRN: [id number] DOB: 1951/07/25   Cancelled Treatment:    Reason Eval/Treat Not Completed: Other (comment)(Spoke with RN this morning, pt has been undergoing 2 hours of disimpaction. Will follow up later as pt available and approrpiate)   Zenovia Jarred, MSOT, OTR/L [facility] Office Number: [phone] Pager: [phone]  Zenovia Jarred 04/22/2020, 11:16 AM

## 2020-04-23 ENCOUNTER — Other Ambulatory Visit: Payer: Self-pay

## 2020-04-23 LAB — BASIC METABOLIC PANEL
Anion gap: 9 (ref 5–15)
BUN: 20 mg/dL (ref 8–23)
CO2: 28 mmol/L (ref 22–32)
Calcium: 9.1 mg/dL (ref 8.9–10.3)
Chloride: 104 mmol/L (ref 98–111)
Creatinine, Ser: 1.35 mg/dL — ABNORMAL HIGH (ref 0.61–1.24)
GFR calc Af Amer: >60 mL/min (ref >60)
GFR calc non Af Amer: 54 mL/min — ABNORMAL LOW (ref >60)
Glucose, Bld: 125 mg/dL — ABNORMAL HIGH (ref 70–99)
Potassium: 4.1 mmol/L (ref 3.5–5.1)
Sodium: 141 mmol/L (ref 135–145)

## 2020-04-23 LAB — GLUCOSE, CAPILLARY
Glucose-Capillary: 128 mg/dL — ABNORMAL HIGH (ref 70–99)
Glucose-Capillary: 131 mg/dL — ABNORMAL HIGH (ref 70–99)
Glucose-Capillary: 139 mg/dL — ABNORMAL HIGH (ref 70–99)
Glucose-Capillary: 173 mg/dL — ABNORMAL HIGH (ref 70–99)

## 2020-04-23 MED ORDER — FE FUMARATE-B12-VIT C-FA-IFC PO CAPS: 1.0000 | ORAL_CAPSULE | Freq: Every day | ORAL | Status: DC

## 2020-04-23 MED ORDER — ASPIRIN 325 MG PO TBEC: 325.0000 mg | DELAYED_RELEASE_TABLET | Freq: Every day | ORAL | 0 refills | Status: DC

## 2020-04-23 NOTE — Progress Notes (Signed)
Physical Therapy Treatment Patient Details Name: Johnathan Murray MRN: [id number] DOB: 08-12-1951 Today's Date: 04/23/2020    History of Present Illness Pt transferred from [place] on 5/24 after he developed afib with RVR and found to have new aortic root abscess with vegetations and prosthetic aortic valve endocarditis. Underwent  aortic root replacement on 04/16/20. Pt originally admitted 4/30 with acute discitis L4-5 and bil septic knees. Transferred to CIR on 5/18. PMH - ACDF on 4/12, back surg 05/2019, CAD, CABG, DM, HTN.    PT Comments    Pt able to sit EOB with supervision for log roll and min(A) for trunk elevation with sitting EOB. Pt reported increases in pain with sitting and was unable to sit up for longer than 2 min. Pt agreeable to complete exercises in bed. Pt wife demonstrated ROM she does with pt. Educated pt and pt wife on LE therex to complete in bed and pt and pt wife demonstrated understanding. Reminded pt of sternal precautions with mobility and pt adhered to precautions during session. Will continue to follow acutely.    Follow Up Recommendations  CIR     Equipment Recommendations  Other (comment)(tbd)    Recommendations for Other Services       Precautions / Restrictions Precautions Precautions: Sternal;Fall;Cervical Precaution Comments: ACDF on 4/12, pt reports they d/c-ed collar 1 week after surgery Required Braces or Orthoses: Spinal Brace Spinal Brace: Lumbar corset Other Brace: for comfort, no brace observed in room    Mobility  Bed Mobility Overal bed mobility: Needs Assistance Bed Mobility: Rolling;Sidelying to Sit Rolling: Min guard Sidelying to sit: Min assist;HOB elevated       General bed mobility comments: Pt able to log roll to EOB supervision and sit up with min(A) for trunk elevation and maintaining sternal precautions. Pt reported pain with this and was able to sit for about 2 min before requiring to lay back down. Pt able to complete bed  mobility and demonstrate exercises he does with his wife to stretch legs  Transfers                    Ambulation/Gait                 Stairs             Wheelchair Mobility    Modified Rankin (Stroke Patients Only)       Balance Overall balance assessment: Needs assistance Sitting-balance support: Feet supported;Bilateral upper extremity supported Sitting balance-Leahy Scale: Fair Sitting balance - Comments: Pt able to sit EOB without assistance for balance for short duration due to pain     Standing balance-Leahy Scale: Poor Standing balance comment: unable tos tand today                            Cognition Arousal/Alertness: Awake/alert Behavior During Therapy: WFL for tasks assessed/performed Overall Cognitive Status: Within Functional Limits for tasks assessed                                 General Comments: Pt was able to follow multi step commands and verbalized awareness of precautions. Pt wife present and both pt and wife demonstrated the exercises they complete while the pt is in bed.      Exercises Total Joint Exercises Ankle Circles/Pumps: AROM;10 reps;Both;Supine Short Arc Quad: AROM;10 reps;Both;Supine Heel Slides: AROM;5 reps;Both;Supine Hip ABduction/ADduction:  AROM;Both;Supine;10 reps    General Comments General comments (skin integrity, edema, etc.): Pt wife present during session and participative. Both pt and wife motivated for pt to complete therapy and get stronger. Pt stating "i told God if I made it out of this I would be a changed man and that is what I am trying to do". Educated pt and wife on LE therex to complete in bed to increase strength      Pertinent Vitals/Pain Pain Assessment: Faces Faces Pain Scale: Hurts whole lot Pain Location: back and hips with mobility Pain Descriptors / Indicators: Grimacing;Guarding;Moaning;Sore;Discomfort Pain Intervention(s): Limited activity within  patient's tolerance;Monitored during session    Home Living                      Prior Function            PT Goals (current goals can now be found in the care plan section) Acute Rehab PT Goals Patient Stated Goal: to get stronger and go back to work PT Goal Formulation: With patient/family Time For Goal Achievement: 05/02/20 Potential to Achieve Goals: Good Progress towards PT goals: Progressing toward goals    Frequency    Min 3X/week      PT Plan Current plan remains appropriate    Co-evaluation              AM-PAC PT "6 Clicks" Mobility   Outcome Measure  Help needed turning from your back to your side while in a flat bed without using bedrails?: None Help needed moving from lying on your back to sitting on the side of a flat bed without using bedrails?: A Little Help needed moving to and from a bed to a chair (including a wheelchair)?: Total Help needed standing up from a chair using your arms (e.g., wheelchair or bedside chair)?: Total Help needed to walk in hospital room?: Total Help needed climbing 3-5 steps with a railing? : Total 6 Click Score: 11    End of Session   Activity Tolerance: Patient limited by pain Patient left: in bed;with call bell/phone within reach;with family/visitor present Nurse Communication: Mobility status PT Visit Diagnosis: Other abnormalities of gait and mobility (R26.89);Muscle weakness (generalized) (M62.81);Pain Pain - part of body: Hip     Time: [id number] PT Time Calculation (min) (ACUTE ONLY): 21 min  Charges:  $Therapeutic Exercise: 8-22 mins                     [employer] SPT 04/23/2020    Rolland Porter 04/23/2020, 6:14 PM

## 2020-04-23 NOTE — Progress Notes (Signed)
Mobility Specialist: Progress Note    04/23/20 1359  Mobility  Activity Stood at bedside  Level of Assistance Maximum assist, patient does 25-49%  Assistive Device None  Mobility Response Tolerated poorly  Mobility performed by Mobility specialist  $Mobility charge 1 Mobility   Pre-Mobility: 71 HR, 95/63 BP, 95% SpO2 Post-Mobility: 72 HR, 115/93 BP, 98% SpO2  Pt tolerated standing poorly. Pt c/o lower back pain when sitting on EOB. After standing pt said lower back pain eased up. Pt held onto me to stand and stood for 30-45 seconds and had to sit back down b/c of legs feeling weak. Pt was positioned back in bed with wife in room.   [facility] Earle Burson Mobility Specialist

## 2020-04-23 NOTE — Plan of Care (Signed)
  Problem: Clinical Measurements: Goal: Ability to maintain clinical measurements within normal limits will improve Outcome: Progressing Goal: Cardiovascular complication will be avoided Outcome: Progressing   

## 2020-04-23 NOTE — Progress Notes (Addendum)
      [address]       [place] [address]             [phone]        7 Days Post-Op Procedure(s) (LRB): REDO STERNOTOMY (N/A) ASCENDING AORTIC ROOT REPLACEMENT - HOMOGRAFT using [employer] 23 MM Cryo Aortic Valve. (N/A) TRANSESOPHAGEAL ECHOCARDIOGRAM (TEE) (N/A)  Subjective: PICC line placed yesterday. He has had more bowel movements and slept a few hours (with Benadryl).  Objective: Vital signs in last 24 hours: Temp:  [97.4 F (36.3 C)-98.4 F (36.9 C)] 98 F (36.7 C) (06/03 0407) Pulse Rate:  [68-83] 77 (06/03 0627) Cardiac Rhythm: Normal sinus rhythm;Heart block (06/02 1900) Resp:  [11-20] 14 (06/03 0627) BP: (112-157)/(71-89) 119/84 (06/03 0407) SpO2:  [98 %-100 %] 100 % (06/03 0627) Weight:  [78.7 kg] 78.7 kg (06/03 0627)  Pre op weight 78.7 kg Current Weight  04/23/20 78.7 kg      Intake/Output from previous day: 06/02 0701 - 06/03 0700 In: 500 [IV Piggyback:500] Out: [id number] [Urine:4910; Stool:1]   Physical Exam:  Cardiovascular: RRR, no murmur Pulmonary: Mostly clear Abdomen: Soft, non tender, bowel sounds present. Extremities: Trace bilateral lower extremity edema. Wounds: Clean and dry.  No erythema or signs of infection.  Lab Results: CBC: Recent Labs    04/21/20 0419  WBC 10.7*  HGB 9.1*  HCT 29.2*  PLT 229   BMET:  Recent Labs    04/22/20 0854 04/23/20 0500  NA 140 141  K 4.1 4.1  CL 99 104  CO2 27 28  GLUCOSE 116* 125*  BUN 22 20  CREATININE 1.33* 1.35*  CALCIUM 8.9 9.1    PT/INR:  Lab Results  Component Value Date   INR 0.9 04/16/2020   INR 1.8 (H) 03/20/2020   INR 1.2 03/02/2020   ABG:  INR: Will add last result for INR, ABG once components are confirmed Will add last 4 CBG results once components are confirmed  Assessment/Plan:  1. CV - Previous a fib (he had a brief episode 2 days ago).  First degree heart block, continues to maintain SR with HR in the 80's this am. On Amiodarone 200 mg  bid and started on Lopressor 12.5 mg bid.  2.  Pulmonary - On room air. Encourage incentive spirometer 3. Volume Overload - Previously diuresed with Lasix and Metolazone. If weight is correct, he is at pre op weight. Monitor without diuresis today 4. DM-CBGs 132/160/128. On Insulin. Pre op HGA1C 6.8. 5. Expected acute blood loss anemia-Last H and H  stable at 9.1 and 29.2. Continue Trinsicon 6. Creatinine this am slightly increased to 1.35.  7. ID-on Ampicillin and Ceftriaxone for disseminated Enterococcus Faecalis 8. Hypothyroidism Levothyroxine 25 mcg daily 9. Bilateral knee pain, septic arthritis s/p bilateral knee irrigation and debridement 10. Patient unable to walk much as has significant back pain;Spinal osteo/discitis with small psoas abscesses and probable myositis in paraspinous muscles related to enterococcal bacteremia.Hopefully, will improve with time/antibiotics 11. Surgically stable, will need rehab CIR at discharge   [facility] 04/23/2020,7:20 AM   Chart reviewed, patient examined, agree with above. Feeling better today after bowels working. Still has a lot of back pain limiting activity.  Able to get some sleep since belly feeling better.

## 2020-04-23 NOTE — Plan of Care (Signed)
  Problem: Education: Goal: Knowledge of General Education information will improve Description Including pain rating scale, medication(s)/side effects and non-pharmacologic comfort measures Outcome: Progressing   Problem: Health Behavior/Discharge Planning: Goal: Ability to manage health-related needs will improve Outcome: Progressing   

## 2020-04-23 NOTE — Progress Notes (Signed)
Mobility Specialist: Progress Note    04/23/20 1720  Mobility  Activity Transferred to/from [facility]  Level of Assistance Maximum assist, patient does 25-49%  Assistive Device None  Mobility Response Tolerated fair  Mobility performed by Mobility specialist  $Mobility charge 1 Mobility   Pre-Mobility: 70 HR, 133/68 BP, 96% SpO2 Post-Mobility: 72 HR, 150/70 BP, 100% SpO2  Pt tolerated transfer to BSC fair. Pt c/o of lower back pain but tolerated better than first session today. Pt used his arms to wrap around my shoulders to assist in standing.   [facility] Johnathan Murray Mobility Specialist

## 2020-04-24 LAB — GLUCOSE, CAPILLARY
Glucose-Capillary: 116 mg/dL — ABNORMAL HIGH (ref 70–99)
Glucose-Capillary: 172 mg/dL — ABNORMAL HIGH (ref 70–99)
Glucose-Capillary: 189 mg/dL — ABNORMAL HIGH (ref 70–99)
Glucose-Capillary: 164 mg/dL — ABNORMAL HIGH (ref 70–99)

## 2020-04-24 LAB — BASIC METABOLIC PANEL
Anion gap: 9 (ref 5–15)
BUN: 17 mg/dL (ref 8–23)
CO2: 27 mmol/L (ref 22–32)
Calcium: 8.6 mg/dL — ABNORMAL LOW (ref 8.9–10.3)
Chloride: 102 mmol/L (ref 98–111)
Creatinine, Ser: 1.35 mg/dL — ABNORMAL HIGH (ref 0.61–1.24)
GFR calc Af Amer: >60 mL/min (ref >60)
GFR calc non Af Amer: 54 mL/min — ABNORMAL LOW (ref >60)
Glucose, Bld: 109 mg/dL — ABNORMAL HIGH (ref 70–99)
Potassium: 3.9 mmol/L (ref 3.5–5.1)
Sodium: 138 mmol/L (ref 135–145)

## 2020-04-24 MED ORDER — AMIODARONE HCL 200 MG PO TABS
200.0000 mg | ORAL_TABLET | Freq: Every day | ORAL | Status: DC
  Administered 2020-04-25 – 2020-04-27 (×3): 200 mg via ORAL
  Filled 2020-04-24 (×3): qty 1

## 2020-04-24 MED FILL — Mannitol IV Soln 20%: INTRAVENOUS | Qty: 500 | Status: AC

## 2020-04-24 MED FILL — Sodium Bicarbonate IV Soln 8.4%: INTRAVENOUS | Qty: 50 | Status: AC

## 2020-04-24 MED FILL — Electrolyte-R (PH 7.4) Solution: INTRAVENOUS | Qty: 6000 | Status: AC

## 2020-04-24 MED FILL — Potassium Chloride Inj 2 mEq/ML: INTRAVENOUS | Qty: 20 | Status: AC

## 2020-04-24 MED FILL — Heparin Sodium (Porcine) Inj 1000 Unit/ML: INTRAMUSCULAR | Qty: 30 | Status: AC

## 2020-04-24 MED FILL — Albumin, Human Inj 5%: INTRAVENOUS | Qty: 250 | Status: AC

## 2020-04-24 MED FILL — Lidocaine HCl Local Soln Prefilled Syringe 100 MG/5ML (2%): INTRAMUSCULAR | Qty: 10 | Status: AC

## 2020-04-24 NOTE — Progress Notes (Signed)
Occupational Therapy Treatment Patient Details Name: Johnathan Murray MRN: [id number] DOB: 1951-06-28 Today's Date: 04/24/2020    History of present illness Pt transferred from [place] on 5/24 after he developed afib with RVR and found to have new aortic root abscess with vegetations and prosthetic aortic valve endocarditis. Underwent  aortic root replacement on 04/16/20. Pt originally admitted 4/30 with acute discitis L4-5 and bil septic knees. Transferred to CIR on 5/18. PMH - ACDF on 4/12, back surg 05/2019, CAD, CABG, DM, HTN.   OT comments  Pt. Seen for skilled OT treatment.  States upon arrival that he is feeling down and sad.  Expresses concerns that he will not regain mobility.  Provided reassurance and encouraged participation in therapy session.  Pt. Eager and agreeable to eob/oob.  Performed initial bed mobility to eob with min a.  Tolerated eob sitting with partial stand max a to side step x3 towards hob.  Pt. With increased c/o pain in back.  Assisted back to bed mod a.  rn present and was preparing to provide iv pain meds.  Encouraged pt. Eob/oob more throughout the day as possible. Pt. Verbalized understanding and agreed. States even though he was in pain he felt better that he had gotten up and repositioned.    Follow Up Recommendations  CIR;Supervision/Assistance - 24 hour    Equipment Recommendations  None recommended by OT    Recommendations for Other Services Rehab consult    Precautions / Restrictions Precautions Precautions: Sternal;Fall;Cervical Precaution Comments: ACDF on 4/12, pt reports they d/c-ed collar 1 week after surgery       Mobility Bed Mobility Overal bed mobility: Needs Assistance Bed Mobility: Rolling;Sidelying to Sit;Sit to Sidelying Rolling: Min guard Sidelying to sit: Min assist;HOB elevated     Sit to sidelying: Mod assist General bed mobility comments: pt. able to log roll and transition into sitting with min a.  tolerated eob wit partial  sit/stand with side steps towards hob. for back to bed. pt. presented with increased anxiety about pain.  attempting to bring legs into bed but required mod a to bring les into bed. cues for precautions while repositioning  Transfers Overall transfer level: Needs assistance Equipment used: 1 person hand held assist Transfers: Sit to/from Stand Sit to Stand: Max assist        Lateral/Scoot Transfers: Max assist General transfer comment: "no offense but youre not strong enough to get me up".  educated pt. on tech. we would be using. placed gait belt and assisted pt. into partial standing.  encouraged a scoot towards eob. pt. able to complete x3. able to scoot towards hob with heavy reliance on ue on therapist    Balance                                           ADL either performed or assessed with clinical judgement   ADL                                               Vision       Perception     Praxis      Cognition Arousal/Alertness: Awake/alert Behavior During Therapy: WFL for tasks assessed/performed Overall Cognitive Status: Within Functional Limits for tasks assessed  General Comments: reports upon arrival that he is feeling "depressed". "i cant end up in a bed or w/c the rest of my life".  provided support and encouraged participation in therapy to aide in his goals for himself        Exercises     Shoulder Instructions       General Comments      Pertinent Vitals/ Pain       Pain Assessment: Faces Faces Pain Scale: Hurts whole lot Pain Location: back and hips with mobility Pain Descriptors / Indicators: Grimacing;Guarding;Moaning;Sore;Discomfort Pain Intervention(s): Limited activity within patient's tolerance;Monitored during session;Repositioned;Patient requesting pain meds-RN notified;RN gave pain meds during session  Home Living                                           Prior Functioning/Environment              Frequency  Min 2X/week        Progress Toward Goals  OT Goals(current goals can now be found in the care plan section)  Progress towards OT goals: Progressing toward goals     Plan Discharge plan remains appropriate    Co-evaluation                 AM-PAC OT "6 Clicks" Daily Activity     Outcome Measure   Help from another person eating meals?: None Help from another person taking care of personal grooming?: A Little Help from another person toileting, which includes using toliet, bedpan, or urinal?: A Lot Help from another person bathing (including washing, rinsing, drying)?: A Lot Help from another person to put on and taking off regular upper body clothing?: A Little Help from another person to put on and taking off regular lower body clothing?: A Lot 6 Click Score: 16    End of Session Equipment Utilized During Treatment: Gait belt  OT Visit Diagnosis: Unsteadiness on feet (R26.81);Other abnormalities of gait and mobility (R26.89);Muscle weakness (generalized) (M62.81);Other symptoms and signs involving cognitive function;Pain   Activity Tolerance Patient limited by pain   Patient Left in bed;with call bell/phone within reach;with nursing/sitter in room   Nurse Communication Other (comment)(rn presnt for most of session.  also encouraging pt. participation as he was doing well)        Time: 7939-0300 OT Time Calculation (min): 12 min  Charges: OT General Charges $OT Visit: 1 Visit OT Treatments $Self Care/Home Management : 8-22 mins  Sonia Baller, [place]   Janice Coffin 04/24/2020, 12:14 PM

## 2020-04-24 NOTE — PMR Pre-admission (Addendum)
PMR Admission Coordinator Pre-Admission Assessment  Patient: Johnathan Murray is an 69 y.o., male MRN: [id number] DOB: March 07, 1951 Height: 5' 10" (177.8 cm) Weight: 79.4 kg  Insurance Information HMO:     PPO: yes     PCP:      IPA:      80/20:      OTHER:  PRIMARY: [employer] [place]      Policy#: [id number]      Subscriber: pt CM Name: Johnathan Murray      Phone#: [phone]     Fax#: [phone] Pre-Cert#: [id number] auth for CIR provided by Johnathan Murray with [employer] [place], updates due via fax on 6/11      Employer:  Benefits:  Phone #: [phone]     Name:  Johnathan Murray     Deduct: $600 (met $600)      Out of Pocket Max: $1800 (met $1800)      Life Max: n/a CIR: 80%      SNF: 80% Outpatient:      Co-Pay: $30/visit Home Health: 100%      Co-Pay:  DME: 80%     Co-Pay: 20% Providers:   SECONDARY:       Policy#:      Phone#:   Development worker, community:       Phone#:   The Therapist, art Information Summary" for patients in Inpatient Rehabilitation Facilities with attached "Privacy Act [place] Records" was provided and verbally reviewed with: Patient and Family  Emergency Contact Information Contact Information    Name Relation Home Work Mobile   Johnathan Murray Spouse [phone]  [phone]      Current Medical History  Patient Admitting Diagnosis: endocarditis, discitis, bilat septic knees  History of Present Illness: Johnathan Murray is a 69 year old right-handed male history of diabetes mellitus, PAF maintained on amiodarone as well as Eliquis, hypertension, bioprosthetic aortic valve replacement 2017, lumbar laminectomy decompression 06/11/2019 as well as recent C3-4 anterior cervical discectomy with interbody fusion for cervical stenosis myelopathy 03/02/2020 per Johnathan Murray.  Presented 03/19/2020 with low-grade fever and weakness of lower extremities.  Lactic acid 1.6, sodium 130, BUN 98, creatinine 6.40 with latest prior creatinine 1.6345 2021, WBC 12,600, hemoglobin 10.4, CK 20,  urinalysis showed RBCs of 21-50 WBC 21-50 protein around 100 bacteria rare hyaline cast, SARS coronavirus negative.  His LFTs were markedly elevated AST 415 ALT 429 total bilirubin 5.5.  CT abdomen pelvis as well as CT cervical spine unremarkable.  MRI cervical spine negative for infection.  Patient was hypotensive received fluid bolus.  Placed on broad-spectrum antibiotics for suspected sepsis.  MRI lumbar spine findings consistent with acute osteomyelitis discitis L4-5.  Evidence for associated soft tissue infection cellulitis within the psoas musculature bilaterally with a few small superimposed abscesses.  Additional multilevel degenerative spondylosis most pronounced at L3-4.  Neurosurgery consulted no plan for surgical intervention underwent aspiration of disc space L4-5.  TEE showed no obvious vegetation and follow-up cardiology services.  Orthopedic services consulted Dr. Mardelle Murray for bilateral knee pain with noted swelling of left knee.  X-ray showed diffuse degenerative changes as well as large joint effusion.  Doppler showed ruptured Baker's cyst on the left lower extremity.  Patient underwent left lower extremity joint aspiration 03/21/2020 and right lower extremity joint aspiration 03/22/2020 both showing intracellular calcium pyrophosphate crystals WBC was up to 15,000 culture grew rare E Faecalis.  Patient did receive steroid injection of both knees.  Underwent I&D bilateral knees 03/24/2020.  Infectious disease continue to follow maintained on  intravenous ampicillin x7 to 8 weeks through 05/20/2020 PICC line was placed 03/29/2020.  AKI on CKD stage III continue to improve multifactorial from bacteremia with latest creatinine 1.35.  Normocytic anemia postoperative patient was transfused 2 units packed red blood cells with latest hemoglobin 9.1 and WBC 10,700.  Patient was admitted to inpatient rehab services 04/07/2020 for ongoing comprehensive inpatient rehab services.  Slow gains with noted bouts of  tachycardia, followed by cardiology services.  His amiodarone had since been resumed now that her renal function had improved.  His Eliquis was ongoing, as prior to admission.  Patient with bouts of low-grade fever, followed by infectious disease.  Ceftriaxone was added to patient's ampicillin for increased coverage.  A follow-up TEE was completed, findings of thoracic root abscess, and patient was transferred to acute care services for ongoing care 04/13/2020.  Patient underwent redo sternotomy with ascending aortic root replacement 04/16/2020 per Dr. Cyndia Murray.  Currently maintained on full-strength aspirin therapy as well as subcutaneous Lovenox.  Ceftriaxone and ampicillin ongoing, again, followed by infectious disease.  He remains on amiodarone 200 mg twice daily.  Therapy evaluations completed and patient was again recommended for inpatient rehab services for comprehensive therapies    Patient's medical record from [facility] has been reviewed by the rehabilitation admission coordinator and physician.  Past Medical History  Past Medical History:  Diagnosis Date  . Abnormal LFTs (liver function tests) 10/31/2016  . Abnormal nuclear stress test 05/03/2018  . Acid reflux 02/15/2018   not at present  . Acute otitis externa of left ear 11/13/2019  . Acute suppurative otitis media of left ear without spontaneous rupture of tympanic membrane 11/13/2019  . Anemia    low iron  . Aortic stenosis 05/03/2018  . Aortic valve abscess 04/13/2020  . ARF (acute renal failure) ([place]) 03/20/2020  . Arthritis 02/15/2018   Overview:  shoulder  . Bacteremia due to Enterococcus 03/21/2020  . Cancer ([place])    skin cancer on left wrist  . Cervical myelopathy ([place]) 04/22/2019  . Conductive hearing loss of left ear 11/13/2019  . Coronary artery disease involving native coronary artery of native heart without angina pectoris 05/30/2016  . Coronary artery disease of native artery of native heart with Murray angina  pectoris ([place])    pt. denies  . Diabetes mellitus due to underlying condition with unspecified complications ([place]) 9/37/3428  . Discitis 04/07/2020  . Dyslipidemia 05/30/2016  . Dyspnea    upon exertion,  as of 06/05/09 not having any recent chest pain  . Dysrhythmia    a fib  . Endocarditis 04/13/2020  . Essential hypertension 05/30/2016  . Heart murmur    Has had an Aortic valve replacement  . History of fusion of cervical spine 03/23/2020  . Hyperactive gag reflex 02/15/2018  . Hypothyroidism   . Intermittent claudication ([place]) 10/31/2018  . Lumbar stenosis with neurogenic claudication 06/11/2019  . Macular degeneration 02/15/2018  . PAF (paroxysmal atrial fibrillation) ([place]) 05/30/2016  . Pre-operative cardiovascular examination 01/03/2020  . Preoperative cardiovascular examination 04/17/2019  . 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 Protein-calorie malnutrition, severe 03/24/2020  . Pseudogout of left knee 03/21/2020  . Renal insufficiency 01/17/2020  . S/P aortic valve replacement 01/17/2020  . S/P AVR 01/17/2020  . Septic arthritis ([place]) 03/23/2020  . Tinnitus of left ear 11/13/2019  . Typical atrial flutter ([facility])     Family History   family history includes Brain cancer in his mother; Heart disease in his maternal grandmother; Prostate  cancer in his brother.  Prior Rehab/Hospitalizations Has the patient had prior rehab or hospitalizations prior to admission? Yes  Has the patient had major surgery during 100 days prior to admission? Yes   Current Medications  Current Facility-Administered Medications:  .  0.45 % sodium chloride infusion, , Intravenous, Continuous PRN, Nani Skillern, PA-C, Stopped at 04/18/20 1418 .  0.9 %  sodium chloride infusion, 250 mL, Intravenous, Continuous, Zimmerman, Donielle M, PA-C .  0.9 %  sodium chloride infusion, 250 mL, Intravenous, PRN, Lightfoot, Harrell O, MD .  amiodarone (PACERONE) tablet 200 mg, 200 mg, Oral, Daily, Gaye Pollack, MD, 200 mg at 04/27/20 0853 .  ampicillin (OMNIPEN) 2 g in sodium chloride 0.9 % 100 mL IVPB, 2 g, Intravenous, Q4H, Golden Circle, FNP, Last Rate: 300 mL/hr at 04/27/20 0901, 2 g at 04/27/20 0901 .  ascorbic acid (VITAMIN C) tablet 500 mg, 500 mg, Oral, Daily, Lars Pinks M, PA-C, 500 mg at 04/27/20 0853 .  aspirin EC tablet 325 mg, 325 mg, Oral, Daily, 325 mg at 04/27/20 0853 **OR** aspirin chewable tablet 324 mg, 324 mg, Per Tube, Daily, Tacy Dura, Donielle M, PA-C .  bisacodyl (DULCOLAX) EC tablet 10 mg, 10 mg, Oral, Daily, 10 mg at 04/27/20 0853 **OR** bisacodyl (DULCOLAX) suppository 10 mg, 10 mg, Rectal, Daily, Lars Pinks M, PA-C, 10 mg at 04/22/20 0915 .  cefTRIAXone (ROCEPHIN) 2 g in sodium chloride 0.9 % 100 mL IVPB, 2 g, Intravenous, Q12H, Golden Circle, FNP, Last Rate: 200 mL/hr at 04/27/20 0055, 2 g at 04/27/20 0055 .  Chlorhexidine Gluconate Cloth 2 % PADS 6 each, 6 each, Topical, Daily, Nani Skillern, PA-C, 6 each at 04/25/20 0854 .  cyclobenzaprine (FLEXERIL) tablet 5 mg, 5 mg, Oral, TID PRN, Lars Pinks M, PA-C, 5 mg at 04/24/20 1730 .  dextrose 50 % solution 0-50 mL, 0-50 mL, Intravenous, PRN, Lars Pinks M, PA-C .  diphenhydrAMINE (BENADRYL) capsule 25 mg, 25 mg, Oral, QHS, Lars Pinks M, PA-C, 25 mg at 04/26/20 2113 .  docusate sodium (COLACE) capsule 200 mg, 200 mg, Oral, Daily, Lars Pinks M, PA-C, 200 mg at 04/27/20 0854 .  enoxaparin (LOVENOX) injection 40 mg, 40 mg, Subcutaneous, QHS, Gaye Pollack, MD, 40 mg at 04/26/20 2114 .  feeding supplement (ENSURE ENLIVE) (ENSURE ENLIVE) liquid 237 mL, 237 mL, Oral, TID BM, Gaye Pollack, MD, 237 mL at 04/26/20 2114 .  ferrous [id number] C-folic acid (TRINSICON / FOLTRIN) capsule 1 capsule, 1 capsule, Oral, BID PC, Gaye Pollack, MD, 1 capsule at 04/27/20 0852 .  CBG monitoring, , , 4x Daily, AC & HS **AND** insulin aspart (novoLOG) injection 0-24 Units,  0-24 Units, Subcutaneous, TID WC, Gaye Pollack, MD, 2 Units at 04/27/20 [phone] .  insulin detemir (LEVEMIR) injection 8 Units, 8 Units, Subcutaneous, Daily, Lars Pinks M, PA-C, 8 Units at 04/26/20 0945 .  lactated ringers infusion, , Intravenous, Continuous, Lars Pinks M, PA-C, Stopped at 04/16/20 2018 .  levothyroxine (SYNTHROID) tablet 25 mcg, 25 mcg, Oral, QAC breakfast, Lars Pinks M, PA-C, 25 mcg at 04/27/20 6433 .  metoprolol tartrate (LOPRESSOR) tablet 12.5 mg, 12.5 mg, Oral, BID, Barrett, Erin R, PA-C, 12.5 mg at 04/27/20 0853 .  morphine 2 MG/ML injection 1-2 mg, 1-2 mg, Intravenous, Q6H PRN, Harriet Pho, Tessa N, PA-C, 2 mg at 04/26/20 1143 .  ondansetron (ZOFRAN) injection 4 mg, 4 mg, Intravenous, Q6H PRN, Lars Pinks M, PA-C, 4 mg at 04/18/20 1333 .  oxyCODONE (OXYCONTIN) 12 hr tablet 10 mg, 10 mg, Oral, Q12H, Lars Pinks M, PA-C, 10 mg at 04/27/20 0853 .  pantoprazole (PROTONIX) EC tablet 40 mg, 40 mg, Oral, Daily, Lars Pinks M, PA-C, 40 mg at 04/27/20 7672 .  polyethylene glycol (MIRALAX / GLYCOLAX) packet 17 g, 17 g, Oral, Daily, Gaye Pollack, MD, 17 g at 04/27/20 0854 .  pregabalin (LYRICA) capsule 100 mg, 100 mg, Oral, TID, Lars Pinks M, PA-C, 100 mg at 04/27/20 0854 .  sodium chloride flush (NS) 0.9 % injection 10-40 mL, 10-40 mL, Intracatheter, Q12H, Gaye Pollack, MD, 10 mL at 04/26/20 2123 .  sodium chloride flush (NS) 0.9 % injection 10-40 mL, 10-40 mL, Intracatheter, PRN, Gaye Pollack, MD .  sodium chloride flush (NS) 0.9 % injection 3 mL, 3 mL, Intravenous, Q12H, Lightfoot, Harrell O, MD, 3 mL at 04/26/20 0909 .  sodium chloride flush (NS) 0.9 % injection 3 mL, 3 mL, Intravenous, PRN, Lightfoot, Harrell O, MD .  traMADol (ULTRAM) tablet 50 mg, 50 mg, Oral, Q4H PRN, Lars Pinks M, PA-C, 50 mg at 04/23/20 1307  Patients Current Diet:  Diet Order            Diet heart healthy/carb modified Room service  appropriate? Yes; Fluid consistency: Thin  Diet effective now              Precautions / Restrictions Precautions Precautions: Sternal, Fall, Cervical Precaution Comments: ACDF on 4/12, pt reports they d/c-ed collar 1 week after surgery Spinal Brace: Lumbar corset Other Brace: for comfort, no brace observed in room Restrictions Weight Bearing Restrictions: Yes(sternal precautions)   Has the patient had 2 or more falls or a fall with injury in the past year? No  Prior Activity Level Community (5-7x/wk): up until ACDF in April was independent, has been significant limited by pain in the last 4 weeks or so  Prior Functional Level Self Care: Did the patient need help bathing, dressing, using the toilet or eating? Independent  Indoor Mobility: Did the patient need assistance with walking from room to room (with or without device)? Independent  Stairs: Did the patient need assistance with internal or external stairs (with or without device)? Independent  Functional Cognition: Did the patient need help planning regular tasks such as shopping or remembering to take medications? [place] / Equipment Home Assistive Devices/Equipment: Cane (specify quad or straight), Environmental consultant (specify type) Home Equipment: Grab bars - tub/shower, Shower seat - built in, [place] - single point, Environmental consultant - 2 wheels  Prior Device Use: Indicate devices/aids used by the patient prior to current illness, exacerbation or injury? None of the above  Current Functional Level Cognition  Overall Cognitive Status: Within Functional Limits for tasks assessed Orientation Level: Oriented X4 General Comments: reports upon arrival that he is feeling "depressed". "i cant end up in a bed or w/c the rest of my life".  provided support and encouraged participation in therapy to aide in his goals for himself    Extremity Assessment (includes Sensation/Coordination)  Upper Extremity Assessment:  Generalized weakness  Lower Extremity Assessment: Defer to PT evaluation    ADLs  Overall ADL's : Needs assistance/impaired Grooming: Set up, Sitting Upper Body Bathing: Set up, Sitting Lower Body Bathing: Maximal assistance, Bed level Upper Body Dressing : Minimal assistance, Sitting Lower Body Dressing: Maximal assistance, Bed level Toilet Transfer: (unable to complete this session due to pain) Toileting- Clothing Manipulation and Hygiene: Maximal assistance Functional mobility during ADLs: +2  for physical assistance, Moderate assistance General ADL Comments: reviewed sternal precautions for ADL; mulitple cues required as pt reaching back to brace self on bed    Mobility  Overal bed mobility: Needs Assistance Bed Mobility: Rolling, Sidelying to Sit, Sit to Sidelying Rolling: Min guard Sidelying to sit: Min assist, HOB elevated Sit to sidelying: Mod assist General bed mobility comments: pt. able to log roll and transition into sitting with min a.  tolerated eob wit partial sit/stand with side steps towards hob. for back to bed. pt. presented with increased anxiety about pain.  attempting to bring legs into bed but required mod a to bring les into bed. cues for precautions while repositioning    Transfers  Overall transfer level: Needs assistance Equipment used: 1 person hand held assist Transfer via Lift Equipment: Stedy Transfers: Sit to/from Stand Sit to Stand: Max assist  Lateral/Scoot Transfers: Max assist General transfer comment: "no offense but youre not strong enough to get me up".  educated pt. on tech. we would be using. placed gait belt and assisted pt. into partial standing.  encouraged a scoot towards eob. pt. able to complete x3. able to scoot towards hob with heavy reliance on ue on therapist    Ambulation / Gait / Stairs / Office manager / Balance Dynamic Sitting Balance Sitting balance - Comments: Pt able to sit EOB without assistance for  balance for short duration due to pain Balance Overall balance assessment: Needs assistance Sitting-balance support: Feet supported, Bilateral upper extremity supported Sitting balance-Leahy Scale: Fair Sitting balance - Comments: Pt able to sit EOB without assistance for balance for short duration due to pain Postural control: Posterior lean Standing balance support: During functional activity Standing balance-Leahy Scale: Poor Standing balance comment: unable tos tand today    Special needs/care consideration Skin sternotomy, Diabetic management yes, Special service needs IV abx and Designated visitor wife, [place] (from acute therapy documentation) Living Arrangements: Spouse/significant other  Lives With: Spouse Available Help at Discharge: Family, Available 24 hours/day Type of Home: House Home Layout: One level Home Access: Stairs to enter Entrance Stairs-Rails: Can reach both Entrance Stairs-Number of Steps: 7 Bathroom Shower/Tub: Multimedia programmer: Handicapped height Bathroom Accessibility: Yes How Accessible: Accessible via walker Home Care Services: No Additional Comments: Grab bars in shower with built in seat, no grab bars near toilet  Discharge Living Setting Plans for Discharge Living Setting: Patient's home Type of Home at Discharge: House Discharge Home Layout: One level Discharge Home Access: Stairs to enter Entrance Stairs-Rails: Right, Left Entrance Stairs-Number of Steps: 7 Discharge Bathroom Shower/Tub: Walk-in shower, Tub only Discharge Bathroom Toilet: Standard Discharge Bathroom Accessibility: Yes How Accessible: Accessible via walker Does the patient have any problems obtaining your medications?: No  Social/Family/Support Systems Patient Roles: Spouse Anticipated Caregiver: Wife- intends to take FMLA Anticipated Caregiver's Contact Information: [name] [phone] Ability/Limitations of Caregiver: None  reported Caregiver Availability: 24/7 Discharge Plan Discussed with Primary Caregiver: Yes Is Caregiver In Agreement with Plan?: Yes Does Caregiver/Family have Issues with Lodging/Transportation while Pt is in Rehab?: No  Goals Patient/Family Goal for Rehab: PT/OT supervision to mod I, SLP n/a Expected length of stay: 14-18 days Pt/Family Agrees to Admission and willing to participate: Yes Program Orientation Provided & Reviewed with Pt/Caregiver Including Roles  & Responsibilities: Yes  Decrease burden of Care through IP rehab admission: n/a  Possible need for SNF placement upon discharge: Not anticipated  Patient Condition:  I have reviewed medical records from [facility], spoken with CM, and patient and spouse. I met with patient at the bedside for inpatient rehabilitation assessment.  Patient will benefit from ongoing PT and OT, can actively participate in 3 hours of therapy a day 5 days of the week, and can make measurable gains during the admission.  Patient will also benefit from the coordinated team approach during an Inpatient Acute Rehabilitation admission.  The patient will receive intensive therapy as well as Rehabilitation physician, nursing, social worker, and care management interventions.  Due to safety, skin/wound care, disease management, medication administration, pain management and patient education the patient requires 24 hour a day rehabilitation nursing.  The patient is currently max to total with mobility and basic ADLs.  Discharge setting and therapy post discharge at home with home health is anticipated.  Patient has agreed to participate in the Acute Inpatient Rehabilitation Program and will admit today.  Preadmission Screen Completed By:  Michel Santee, PT, DPT 04/27/2020 10:43 AM ______________________________________________________________________   Discussed status with Dr. Letta Pate on 04/27/20  at 10:43 AM  and received approval for admission  today.  Admission Coordinator:  Michel Santee, PT, DPT time 10:43 AM Sudie Grumbling 04/27/20    Assessment/Plan: Diagnosis:  Debility after AVR, infected prosthetic valve 1. Does the need for close, 24 hr/day Medical supervision in concert with the patient's rehab needs make it unreasonable for this patient to be served in a less intensive setting? Yes 2. Co-Morbidities requiring supervision/potential complications: DM, PAF, HTN, L4-5 discitis on IV abx 3. Due to bladder management, bowel management, safety, skin/wound care, disease management, medication administration, pain management and patient education, does the patient require 24 hr/day rehab nursing? Yes 4. Does the patient require coordinated care of a physician, rehab nurse, PT, OT, and SLP to address physical and functional deficits in the context of the above medical diagnosis(es)? Yes Addressing deficits in the following areas: balance, endurance, locomotion, strength, transferring, bowel/bladder control, bathing, dressing, feeding, grooming, toileting and psychosocial support 5. Can the patient actively participate in an intensive therapy program of at least 3 hrs of therapy 5 days a week? Yes 6. The potential for patient to make measurable gains while on inpatient rehab is good 7. Anticipated functional outcomes upon discharge from inpatient rehab: supervision PT, supervision OT, n/a SLP 8. Estimated rehab length of stay to reach the above functional goals is: 14-18d 9. Anticipated discharge destination: Home 10. Overall Rehab/Functional Prognosis: good   Addendum: Patient was held for CIR admission due to Afib with RVR- is appropriate for admission TODAY 04/28/2020-     MD Signature: Charlett Blake M.D. [facility] FAAPM&R (Neuromuscular Med) Diplomate Am Board of Electrodiagnostic Med Fellow Am Board of Interventional Pain

## 2020-04-24 NOTE — Progress Notes (Signed)
Mobility Specialist - Progress Note   04/24/20 1332  Mobility  Activity Stood at bedside  Level of Assistance Maximum assist, patient does 25-49%  Assistive Device None  Mobility Response Tolerated poorly  Mobility performed by Mobility specialist  $Mobility charge 1 Mobility    Pre-mobility: 63 HR, 120/63 BP, 100% SpO2 Post-mobility: 71 HR, 106/90 BP, 100% SPO2  Pt was able to sit up on the edge of the bed w/o assistance, he needed max assist in order to stand. We stood by him putting his arm around my neck and me squatting him up as he didn't feel confident to support himself at all. We stood this way for 45 seconds with me supporting him 25%, when walking I had to do 50% and his L leg buckled on the third step. I safely returned him to his bed.   [place] Specialist

## 2020-04-24 NOTE — Progress Notes (Signed)
Inpatient Rehab Admissions Coordinator:   Spoke to representative from [facility] [place] and their typical turn around time for prior authorization is about 72 hours.  Will continue to follow for potential rehab admit (hopefully early next week), pending insurance auth and bed availability.   Shann Medal, PT, DPT Admissions Coordinator [phone] 04/24/20  3:46 PM

## 2020-04-24 NOTE — Progress Notes (Signed)
Discussed sternal precautions, IS, and CRPII. Pt and daughter receptive. Declines diet sheet but sts knows how to watch his a1c, esp when he is more mobile. Will refer to [place] for "down the road" when more ambulatory. Will sign off at this time since he is not able to ambulate and is working with PT/OT/MS. [place], ACSM 2:34 PM 04/24/2020

## 2020-04-24 NOTE — Progress Notes (Addendum)
      [address]       [place] [address]             [phone]        8 Days Post-Op Procedure(s) (LRB): REDO STERNOTOMY (N/A) ASCENDING AORTIC ROOT REPLACEMENT - HOMOGRAFT using [employer] 23 MM Cryo Aortic Valve. (N/A) TRANSESOPHAGEAL ECHOCARDIOGRAM (TEE) (N/A)  Subjective: PICC line placed yesterday. He has had more bowel movements and feels much better.  Objective: Vital signs in last 24 hours: Temp:  [97.7 F (36.5 C)-98.3 F (36.8 C)] 98.2 F (36.8 C) (06/04 0437) Pulse Rate:  [64-108] 81 (06/04 0437) Cardiac Rhythm: Normal sinus rhythm (06/03 1901) Resp:  [11-19] 19 (06/04 0437) BP: (100-147)/(60-89) 147/69 (06/04 0437) SpO2:  [95 %-100 %] 98 % (06/04 0437) Weight:  [77.8 kg] 77.8 kg (06/04 0305)  Pre op weight 78.7 kg Current Weight  04/24/20 77.8 kg      Intake/Output from previous day: 06/03 0701 - 06/04 0700 In: 860 [P.O.:360; IV Piggyback:500] Out: 2102 [Urine:2100; Stool:2]   Physical Exam:  Cardiovascular: RRR, no murmur Pulmonary: Mostly clear Abdomen: Soft, non tender, bowel sounds present. Extremities: No lower extremity edema. Wounds: Clean and dry.  No erythema or signs of infection.  Lab Results: CBC: No results for input(s): WBC, HGB, HCT, PLT in the last 72 hours. BMET:  Recent Labs    04/23/20 0500 04/24/20 0524  NA 141 138  K 4.1 3.9  CL 104 102  CO2 28 27  GLUCOSE 125* 109*  BUN 20 17  CREATININE 1.35* 1.35*  CALCIUM 9.1 8.6*    PT/INR:  Lab Results  Component Value Date   INR 0.9 04/16/2020   INR 1.8 (H) 03/20/2020   INR 1.2 03/02/2020   ABG:  INR: Will add last result for INR, ABG once components are confirmed Will add last 4 CBG results once components are confirmed  Assessment/Plan:  1. CV - Previous a fib;he had a brief episode 2 days ago  but continues to maintain SR, first degree heart block with HR in the 80's this am. On Amiodarone 200 mg bid and started on Lopressor 12.5 mg  bid.  2.  Pulmonary - On room air. Encourage incentive spirometer 3. DM-CBGs 139/173/116. On Insulin. Pre op HGA1C 6.8. 4. Expected acute blood loss anemia-Last H and H  stable at 9.1 and 29.2. Continue Trinsicon 5. Creatinine this am remains 1.35. Diuresis stopped yesterday 6. ID-on Ampicillin and Ceftriaxone for disseminated Enterococcus Faecalis 7. Hypothyroidism Levothyroxine 25 mcg daily 8. Bilateral knee pain, septic arthritis s/p bilateral knee irrigation and debridement 9. Patient unable to walk much as has significant back pain;Spinal osteo/discitis with small psoas abscesses and probable myositis in paraspinous muscles related to enterococcal bacteremia.Hopefully, will improve with time/antibiotics 10. To CIR when bed available  Donielle M ZimmermanPA-C 04/24/2020,7:11 AM    Chart reviewed, patient examined, agree with above. Continues to feel better daily. Bowels working well now. Had BM at 3 am this morning. Back pain stable. He says he got up yesterday with PT and was able to stand. Had minimal pain standing. Rhythm is sinus 50's with no further Afib. Will decrease amiodarone to 200 mg daily. Overall he is medically very stable. I think he could go to CIR if approved when bed available.

## 2020-04-25 LAB — GLUCOSE, CAPILLARY
Glucose-Capillary: 129 mg/dL — ABNORMAL HIGH (ref 70–99)
Glucose-Capillary: 121 mg/dL — ABNORMAL HIGH (ref 70–99)
Glucose-Capillary: 132 mg/dL — ABNORMAL HIGH (ref 70–99)
Glucose-Capillary: 134 mg/dL — ABNORMAL HIGH (ref 70–99)

## 2020-04-25 NOTE — Progress Notes (Signed)
Mobility Specialist - Progress Note   04/25/20 1036  Mobility  Activity Dangled on edge of bed  Level of Assistance Independent  Assistive Device None  Mobility Response Tolerated fair  Mobility performed by Mobility specialist  $Mobility charge 1 Mobility    Pre-mobility: 79 HR, 96% SpO2 Post-mobility:71 HR, 97%SPO2  Pt's L leg cramped 3x when trying to sit up on his own, after finally sitting up he said that his pain was too great to try any further mobility. He said he would be willing to try more after he gets his pain med, I discussed coordinating this with his nurse and will try further ambulation later today.   [place] Specialist

## 2020-04-25 NOTE — Progress Notes (Addendum)
      [address]       [place] [address]             [phone]        9 Days Post-Op Procedure(s) (LRB): REDO STERNOTOMY (N/A) ASCENDING AORTIC ROOT REPLACEMENT - HOMOGRAFT using [employer] 23 MM Cryo Aortic Valve. (N/A) TRANSESOPHAGEAL ECHOCARDIOGRAM (TEE) (N/A)  Subjective: Patient asking for menu to order breakfast. He has no specific complaint this am  Objective: Vital signs in last 24 hours: Temp:  [97.5 F (36.4 C)-98.6 F (37 C)] 97.8 F (36.6 C) (06/05 0749) Pulse Rate:  [57-73] 68 (06/05 0749) Cardiac Rhythm: Normal sinus rhythm (06/05 0348) Resp:  [12-17] 14 (06/05 0749) BP: (103-141)/(53-70) 125/67 (06/05 0749) SpO2:  [95 %-99 %] 99 % (06/05 0749) Weight:  [78.9 kg] 78.9 kg (06/05 0500)  Pre op weight 78.7 kg Current Weight  04/25/20 78.9 kg      Intake/Output from previous day: 06/04 0701 - 06/05 0700 In: 360 [P.O.:360] Out: 3050 [Urine:3050]   Physical Exam:  Cardiovascular: RRR, no murmur Pulmonary: Mostly clear Abdomen: Soft, non tender, bowel sounds present. Extremities: No lower extremity edema. Wounds: Clean and dry.  No erythema or signs of infection.  Lab Results: CBC: No results for input(s): WBC, HGB, HCT, PLT in the last 72 hours. BMET:  Recent Labs    04/23/20 0500 04/24/20 0524  NA 141 138  K 4.1 3.9  CL 104 102  CO2 28 27  GLUCOSE 125* 109*  BUN 20 17  CREATININE 1.35* 1.35*  CALCIUM 9.1 8.6*    PT/INR:  Lab Results  Component Value Date   INR 0.9 04/16/2020   INR 1.8 (H) 03/20/2020   INR 1.2 03/02/2020   ABG:  INR: Will add last result for INR, ABG once components are confirmed Will add last 4 CBG results once components are confirmed  Assessment/Plan:  1. CV - Previous a fib;he had a brief episode 2 days ago  but continues to maintain SR, first degree heart block with HR in the 60's this am. On Amiodarone 200 mg daily and Lopressor 12.5 mg bid.  2.  Pulmonary - On room air.  Encourage incentive spirometer 3. DM-CBGs 189/164/129. On Insulin. Pre op HGA1C 6.8. 4. Expected acute blood loss anemia-Last H and H  stable at 9.1 and 29.2. Continue Trinsicon 5. ID-on Ampicillin and Ceftriaxone for disseminated Enterococcus Faecalis 6. Hypothyroidism- Levothyroxine 25 mcg daily 7. Bilateral knee pain, septic arthritis s/p bilateral knee irrigation and debridement 8. Spinal osteo/discitis with small psoas abscesses and probable myositis in paraspinous muscles related to enterococcal bacteremia.Hopefully, will improve with time/antibiotics 10. To CIR when insurance approves and when bed available  Donielle M ZimmermanPA-C 04/25/2020,7:52 AM  patient examined and medical record reviewed,agree with above note. Tharon Aquas Trigt III 04/25/2020

## 2020-04-25 NOTE — Progress Notes (Signed)
Mobility Specialist - Progress Note   04/25/20 1409  Mobility  Activity Stood at bedside  Level of Assistance Minimal assist, patient does 75% or more  Assistive Device Front wheel walker  Mobility Response Tolerated fair  Mobility performed by Mobility specialist  $Mobility charge 1 Mobility    Pre-mobility: 65 HR, 136/74 BP, 99% SpO2 Post-mobility: 65 HR, 144/62 BP, 98% SPO2  Pt able to stand up beside bed with min assist and used a RW to hold himself up. He walked in place and took 6 steps before having to lay back down due to lower back pain.   [place] Specialist

## 2020-04-26 ENCOUNTER — Inpatient Hospital Stay (HOSPITAL_COMMUNITY): Payer: BC Managed Care – PPO

## 2020-04-26 DIAGNOSIS — J90 Pleural effusion, not elsewhere classified: Secondary | ICD-10-CM | POA: Diagnosis not present

## 2020-04-26 DIAGNOSIS — I38 Endocarditis, valve unspecified: Secondary | ICD-10-CM | POA: Diagnosis not present

## 2020-04-26 LAB — CBC
HCT: 28 % — ABNORMAL LOW (ref 39.0–52.0)
Hemoglobin: 8.6 g/dL — ABNORMAL LOW (ref 13.0–17.0)
MCH: 29.5 pg (ref 26.0–34.0)
MCHC: 30.7 g/dL (ref 30.0–36.0)
MCV: 95.9 fL (ref 80.0–100.0)
Platelets: 321 10*3/uL (ref 150–400)
RBC: 2.92 MIL/uL — ABNORMAL LOW (ref 4.22–5.81)
RDW: 15.4 % (ref 11.5–15.5)
WBC: 9.4 10*3/uL (ref 4.0–10.5)
nRBC: 0 % (ref 0.0–0.2)

## 2020-04-26 LAB — GLUCOSE, CAPILLARY
Glucose-Capillary: 120 mg/dL — ABNORMAL HIGH (ref 70–99)
Glucose-Capillary: 167 mg/dL — ABNORMAL HIGH (ref 70–99)
Glucose-Capillary: 199 mg/dL — ABNORMAL HIGH (ref 70–99)
Glucose-Capillary: 137 mg/dL — ABNORMAL HIGH (ref 70–99)

## 2020-04-26 LAB — BASIC METABOLIC PANEL
Anion gap: 12 (ref 5–15)
BUN: 19 mg/dL (ref 8–23)
CO2: 26 mmol/L (ref 22–32)
Calcium: 8.9 mg/dL (ref 8.9–10.3)
Chloride: 99 mmol/L (ref 98–111)
Creatinine, Ser: 1.39 mg/dL — ABNORMAL HIGH (ref 0.61–1.24)
GFR calc Af Amer: >60 mL/min (ref >60)
GFR calc non Af Amer: 52 mL/min — ABNORMAL LOW (ref >60)
Glucose, Bld: 176 mg/dL — ABNORMAL HIGH (ref 70–99)
Potassium: 4 mmol/L (ref 3.5–5.1)
Sodium: 137 mmol/L (ref 135–145)

## 2020-04-26 NOTE — Progress Notes (Addendum)
      [address]       [place] [address]             [phone]        10 Days Post-Op Procedure(s) (LRB): REDO STERNOTOMY (N/A) ASCENDING AORTIC ROOT REPLACEMENT - HOMOGRAFT using [employer] 23 MM Cryo Aortic Valve. (N/A) TRANSESOPHAGEAL ECHOCARDIOGRAM (TEE) (N/A)  Subjective: Patient sleeping this am. He states he did walk yesterday.  Objective: Vital signs in last 24 hours: Temp:  [97.6 F (36.4 C)-98.7 F (37.1 C)] 98 F (36.7 C) (06/06 0333) Pulse Rate:  [64-68] 64 (06/06 0333) Cardiac Rhythm: Normal sinus rhythm (06/05 1900) Resp:  [12-15] 12 (06/06 0333) BP: (111-136)/(57-74) 111/57 (06/06 0333) SpO2:  [98 %-100 %] 99 % (06/06 0333) Weight:  [79.9 kg] 79.9 kg (06/06 0604)  Pre op weight 78.7 kg Current Weight  04/26/20 79.9 kg      Intake/Output from previous day: 06/05 0701 - 06/06 0700 In: 988.9 [P.O.:240; IV Piggyback:748.9] Out: 2510 [Urine:2510]   Physical Exam:  Cardiovascular: RRR, no murmur Pulmonary: Mostly clear Abdomen: Soft, non tender, bowel sounds present. Extremities: No lower extremity edema. Wounds: Clean and dry.  No erythema or signs of infection.  Lab Results: CBC: Recent Labs    04/26/20 0315  WBC 9.4  HGB 8.6*  HCT 28.0*  PLT 321   BMET:  Recent Labs    04/24/20 0524 04/26/20 0315  NA 138 137  K 3.9 4.0  CL 102 99  CO2 27 26  GLUCOSE 109* 176*  BUN 17 19  CREATININE 1.35* 1.39*  CALCIUM 8.6* 8.9    PT/INR:  Lab Results  Component Value Date   INR 0.9 04/16/2020   INR 1.8 (H) 03/20/2020   INR 1.2 03/02/2020   ABG:  INR: Will add last result for INR, ABG once components are confirmed Will add last 4 CBG results once components are confirmed  Assessment/Plan:  1. CV - Previous a fib;he had a brief episode 2 days ago  but continues to maintain SR, first degree heart block with HR in the 70's this am. On Amiodarone 200 mg daily and Lopressor 12.5 mg bid.  2.  Pulmonary - On room  air. Encourage incentive spirometer 3. DM-CBGs 132/134/120. On Insulin. Pre op HGA1C 6.8. 4. Expected acute blood loss anemia-Last H and H  stable at 8.6 and 28. Continue Trinsicon 5. ID-on Ampicillin and Ceftriaxone for disseminated Enterococcus Faecalis 6. Hypothyroidism- Levothyroxine 25 mcg daily 7. Bilateral knee pain, septic arthritis s/p bilateral knee irrigation and debridement 8. Spinal osteo/discitis with small psoas abscesses and probable myositis in paraspinous muscles related to enterococcal bacteremia.Hopefully, will improve with time/antibiotics 9. Creatinine has remained under 1.4 for days (1.39 this am). He is not on diuretic or ACE/ARB  10. To CIR when insurance approves and when bed available  Donielle M ZimmermanPA-C 04/26/2020,7:30 AM Patient continues to improve and is able to walk about 50 feet in the hallway.  Chest incisions all healing nicely. patient examined and medical record reviewed,agree with above note. Tharon Aquas Trigt III 04/26/2020

## 2020-04-26 NOTE — Progress Notes (Signed)
Mobility Specialist - Progress Note   04/26/20 1230  Mobility  Activity Ambulated in room  Level of Assistance Minimal assist, patient does 75% or more  Assistive Device Front wheel walker  Distance Ambulated (ft) 24 ft  Mobility Response Tolerated fair  Mobility performed by Mobility specialist  $Mobility charge 1 Mobility    Pre-mobility: 68 HR, 121/64 BP, 97% SpO2 Post-mobility: 70 HR, 135/66 BP, 99% SPO2  Pt able to stand at bedside with min assist, he then walked to the wall by his bedside and back using a RW. Pt did this twice but required sitting rest breaks in between due to R sided hip pain. After the second time he said he couldn't do anymore due to his hip pain.   [place] Specialist

## 2020-04-27 LAB — GLUCOSE, CAPILLARY
Glucose-Capillary: 126 mg/dL — ABNORMAL HIGH (ref 70–99)
Glucose-Capillary: 226 mg/dL — ABNORMAL HIGH (ref 70–99)
Glucose-Capillary: 135 mg/dL — ABNORMAL HIGH (ref 70–99)
Glucose-Capillary: 164 mg/dL — ABNORMAL HIGH (ref 70–99)

## 2020-04-27 MED ORDER — ASPIRIN 325 MG PO TBEC: 325.0000 mg | DELAYED_RELEASE_TABLET | Freq: Every day | ORAL | 0 refills | Status: DC

## 2020-04-27 MED ORDER — SODIUM CHLORIDE 0.9 % IV SOLN: 2.0000 g | INTRAVENOUS | Status: DC

## 2020-04-27 MED ORDER — METOPROLOL TARTRATE 25 MG PO TABS
25.0000 mg | ORAL_TABLET | Freq: Two times a day (BID) | ORAL | Status: DC
  Administered 2020-04-27 – 2020-04-30 (×4): 25 mg via ORAL
  Filled 2020-04-27 (×6): qty 1

## 2020-04-27 MED ORDER — SODIUM CHLORIDE 0.9 % IV SOLN: 2.0000 g | Freq: Two times a day (BID) | INTRAVENOUS | Status: DC

## 2020-04-27 MED ORDER — MORPHINE SULFATE (PF) 2 MG/ML IV SOLN: 1.0000 mg | Freq: Three times a day (TID) | INTRAVENOUS | 0 refills | Status: DC | PRN

## 2020-04-27 MED ORDER — AMIODARONE HCL 200 MG PO TABS: 200.0000 mg | ORAL_TABLET | Freq: Every day | ORAL | Status: DC

## 2020-04-27 MED ORDER — APIXABAN 5 MG PO TABS
5.0000 mg | ORAL_TABLET | Freq: Two times a day (BID) | ORAL | Status: DC
  Administered 2020-04-27 – 2020-04-30 (×6): 5 mg via ORAL
  Filled 2020-04-27 (×6): qty 1

## 2020-04-27 MED ORDER — ASPIRIN EC 81 MG PO TBEC
81.0000 mg | DELAYED_RELEASE_TABLET | Freq: Every day | ORAL | Status: DC
  Administered 2020-04-27 – 2020-04-30 (×4): 81 mg via ORAL
  Filled 2020-04-27 (×4): qty 1

## 2020-04-27 MED ORDER — METOPROLOL TARTRATE 25 MG PO TABS: 12.5000 mg | ORAL_TABLET | Freq: Two times a day (BID) | ORAL | Status: DC

## 2020-04-27 NOTE — Progress Notes (Signed)
Mobility Specialist: Progress Note    04/27/20 1451  Mobility  Activity Ambulated in room  Level of Assistance Moderate assist, patient does 50-74%  Assistive Device Front wheel walker  Distance Ambulated (ft) 10 ft (Pt walked around bed)  Mobility Response Tolerated fair  Mobility performed by Mobility specialist  $Mobility charge 1 Mobility   Pre-Mobility: 105 HR, 117/62 BP, 98% SpO2 During Mobility: 141 HR Post-Mobility: 131 HR, 115/96 BP, 100% SpO2  Pt c/o of lower back pain but said it has gotten better. Pt walked around the end of the bed to the other side. Pt said he felt tired after returning to bed.   [facility] Shalan Neault Mobility Specialist

## 2020-04-27 NOTE — Progress Notes (Signed)
Pt in bed eating breakfast, refusing ambulation at this time.  Encouraged to prepare for getting OOB later this morning.

## 2020-04-27 NOTE — Progress Notes (Signed)
Notified by preadmission coordinator that patient's heart rate on monitor is elevated. Patient in the bed.  He states that this morning he felt his heart rate go up and he knew he was back in atrial fibrillation.  He feels weak. Examination General no acute distress Mood and affect are appropriate Lungs are clear to auscultation Chest has well-healed sternotomy incision and right subclavicular incision. Heart irregularly irregular, tachycardia, no murmur Abdomen plus bowel sounds soft nontender Extremities without edema Impression paroxysmal atrial fibrillation back in A. fib with RVR. Would hold off on rehab admission today ( no monitored beds on rehab ), question cardioversion versus rate control, defer to CVTS, question need for anticoagulation.  Charlett Blake M.D. [facility] FAAPM&R (Neuromuscular Med) Diplomate Am Board of Electrodiagnostic Med Fellow Am Board of Interventional Pain

## 2020-04-27 NOTE — Progress Notes (Signed)
Mobility Specialist: Progress Note   04/27/20 1658  Mobility  Activity Refused mobility  Mobility performed by Mobility specialist   Pt refused mobility. Pt states he is tired and wants to rest.   [place] Gave Nera Haw[place] Mobility Specialist

## 2020-04-27 NOTE — Progress Notes (Signed)
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 Underwent  aortic root replacement on 04/16/20. Pt originally admitted 4/30 with acute discitis L4-5 and bil septic knees. Transferred to CIR on 5/18. PMH - ACDF on 4/12, back surg 05/2019, CAD, CABG, DM, HTN.    PT Comments    Pt reports he just walked with mobility tech, however agreeable to exercise in bed. Bed placed in chair position and pt able to perform ankle, knee and hip exercise before increased pain in back. Pain relieved with return to supine. D/c plans remain appropriate. PT will continue to follow acutely.   Follow Up Recommendations  CIR     Equipment Recommendations  None recommended by PT       Precautions / Restrictions Precautions Precautions: Sternal;Fall;Cervical Precaution Comments: ACDF on 4/12, pt reports they d/c-ed collar 1 week after surgery Spinal Brace: Lumbar corset Other Brace: for comfort, no brace in room Restrictions Weight Bearing Restrictions: Yes(sternal precautions)    Mobility  Bed Mobility               General bed mobility comments: deferred as he just walked with mobility tech               Cognition Arousal/Alertness: Awake/alert Behavior During Therapy: WFL for tasks assessed/performed Overall Cognitive Status: Within Functional Limits for tasks assessed                                        Exercises Total Joint Exercises Ankle Circles/Pumps: Seated;10 reps;Strengthening Hip ABduction/ADduction: Strengthening;Both;10 reps;Seated General Exercises - Lower Extremity Quad Sets: AROM;Both;10 reps;Supine Gluteal Sets: AROM;Both;10 reps;Seated Long Arc Quad: AROM;Both;10 reps;Seated     General Comments General comments (skin integrity, edema, etc.): Wife present, A-fib with HR 82-102bpm      Pertinent Vitals/Pain Pain Assessment: Faces Faces Pain Scale: Hurts even more Pain Location: back when improperly engaged with LE exercise Pain Descriptors / Indicators: Grimacing;Guarding;Moaning;Sore;Discomfort Pain Intervention(s): Limited activity within patient's tolerance;Monitored during session;Repositioned           PT Goals (current goals can now be found in the care plan section) Acute Rehab PT Goals PT Goal Formulation: With patient/family Time For Goal Achievement: 05/02/20 Potential to Achieve Goals: Good Progress towards PT goals: Progressing toward goals    Frequency    Min 3X/week      PT Plan Current plan remains appropriate       AM-PAC PT "6 Clicks" Mobility   Outcome Measure  Help needed turning from your back to your side while in a flat bed without using bedrails?: None Help needed moving from lying on your back to sitting on the side of a flat bed without using bedrails?: A Little Help needed moving to and from a bed to a chair (including a wheelchair)?: A Lot Help needed standing up from a chair using your arms (e.g., wheelchair or bedside chair)?: A Lot Help needed to walk in hospital room?: A Lot Help needed climbing 3-5 steps with a railing? : Total 6 Click Score: 14    End of Session   Activity Tolerance: Patient limited by pain Patient left: in bed;with call bell/phone within  reach;with family/visitor present Nurse Communication: Mobility status PT Visit Diagnosis: Other abnormalities of gait and mobility (R26.89);Muscle weakness (generalized) (M62.81);Pain Pain - part of body: (back)     Time: 0383-3383 PT Time Calculation (min) (ACUTE ONLY): 21 min  Charges:  $Therapeutic Exercise: 8-22 mins                     Lesli Issa B. Migdalia Dk PT, DPT Acute Rehabilitation Services Pager [phone] Office [phone]    [place] 04/27/2020, 5:49 PM

## 2020-04-27 NOTE — Progress Notes (Signed)
Inpatient Rehab Admissions Coordinator:   Per Dr. Letta Pate will hold CIR admission today due to pt back in afib with RVR.  Will contact CVTS and TOC team and let them know.   Shann Medal, PT, DPT Admissions Coordinator [phone] 04/27/20  11:54 AM

## 2020-04-27 NOTE — Progress Notes (Signed)
CCMD notification received that pt has converted to AF, rate 90-100s.  Joellyn Rued, PA notified.  Will administer scheduled amio and lopressor and continue to monitor.

## 2020-04-27 NOTE — Progress Notes (Signed)
Inpatient Rehab Admissions Coordinator:   I have insurance authorization and a bed available for pt to admit to CIR today.  Nicholes Rough, PA-C, in agreement.  Will let pt/family and TOC team know.   Shann Medal, PT, DPT Admissions Coordinator [phone] 04/27/20  10:40 AM

## 2020-04-27 NOTE — Consult Note (Signed)
   [facility] Inpatient Consult   04/27/2020  Hence Derrick 1951/10/13 [id number]   [facility] [ACO] Patient:  Medicare NextGen  Patient screened for high risk score for unplanned readmission score and for readmission in less than 7 days readmission hospitalizations.  Medicare record reviewed to check if potential [facility] Management service needs.  Review of patient's medical record reveals patient is being considered and recommended for an inpatient rehabilitation level of care.   Primary Care Provider is Nicoletta Dress, MD, this provider is listed to provide the transition of care [TOC] for post hospital follow up.  Plan: Continue to follow progress and disposition to assess for post hospital care management needs.    Please place a [facility] Care Management consult as appropriate and for questions contact:   Natividad Brood, RN BSN [facility] Liaison  [phone] business mobile phone Toll free office [phone]  Fax number: [phone] [email or web address]

## 2020-04-27 NOTE — Progress Notes (Addendum)
      [address]       [place] [address]             [phone]      11 Days Post-Op Procedure(s) (LRB): REDO STERNOTOMY (N/A) ASCENDING AORTIC ROOT REPLACEMENT - HOMOGRAFT using [employer] 23 MM Cryo Aortic Valve. (N/A) TRANSESOPHAGEAL ECHOCARDIOGRAM (TEE) (N/A) Subjective: Feels okay this morning. He did walk some yesterday with therapy.   Objective: Vital signs in last 24 hours: Temp:  [98 F (36.7 C)-99 F (37.2 C)] 98.2 F (36.8 C) (06/07 0420) Pulse Rate:  [68-79] 71 (06/07 0420) Cardiac Rhythm: Normal sinus rhythm (06/06 1900) Resp:  [12-18] 14 (06/07 0420) BP: (110-124)/(55-71) 122/62 (06/07 0420) SpO2:  [97 %-100 %] 100 % (06/07 0420) Weight:  [79.4 kg] 79.4 kg (06/07 0420)     Intake/Output from previous day: 06/06 0701 - 06/07 0700 In: -  Out: 0160 [Urine:1275] Intake/Output this shift: No intake/output data recorded.  General appearance: alert, cooperative and no distress Heart: regular rate and rhythm, S1, S2 normal, no murmur, click, rub or gallop Lungs: clear to auscultation bilaterally Abdomen: soft, non-tender; bowel sounds normal; no masses,  no organomegaly Extremities: extremities normal, atraumatic, no cyanosis or edema Wound: clean and dry  Lab Results: Recent Labs    04/26/20 0315  WBC 9.4  HGB 8.6*  HCT 28.0*  PLT 321   BMET:  Recent Labs    04/26/20 0315  NA 137  K 4.0  CL 99  CO2 26  GLUCOSE 176*  BUN 19  CREATININE 1.39*  CALCIUM 8.9    PT/INR: No results for input(s): LABPROT, INR in the last 72 hours. ABG    Component Value Date/Time   PHART 7.409 04/16/2020 2343   HCO3 25.0 04/16/2020 2343   TCO2 26 04/16/2020 2343   O2SAT 96.0 04/16/2020 2343   CBG (last 3)  Recent Labs    04/26/20 1627 04/26/20 2210 04/27/20 0606  GLUCAP 199* 137* 126*    Assessment/Plan: S/P Procedure(s) (LRB): REDO STERNOTOMY (N/A) ASCENDING AORTIC ROOT REPLACEMENT - HOMOGRAFT using [employer] 23 MM  Cryo Aortic Valve. (N/A) TRANSESOPHAGEAL ECHOCARDIOGRAM (TEE) (N/A)  1. CV-previous afib a few days ago, but in NSR this morning in the 70s, BP well controlled 2. Pulm- tolerating room air with good oxygen saturation 3. Renal-CXR yesterday stable with small pleural effusions.  4. H and h 8.6/28.0, expected acute blood loss anemia 5. Endo-blood glucose well controlled.  6. ID-on Ampicillin and Ceftriaxone for disseminated Enterococcus Faecalis 7. Spinal osteo/discitis with small psoas abscesses and probable myositis in paraspinous muscles related to enterococcal bacteremia  Plan: Feels okay this morning-he continues to have back pain but if he gets his morphine before therapy he does better. Hopefully to CIR soon.     LOS: 14 days    Johnathan Murray 04/27/2020   Chart reviewed, patient examined, agree with above. He went back into atrial fib today but rate controlled 80-90's. I would not change his amiodarone or Lopressor. Will resume Eliquis. He can go to rehab tomorrow.

## 2020-04-27 NOTE — Care Management Important Message (Signed)
Important Message  Patient Details  Name: Breydon Senters MRN: [id number] Date of Birth: 31-May-1951   Medicare Important Message Given:  Yes     Shelda Altes 04/27/2020, 3:30 PM

## 2020-04-28 DIAGNOSIS — M00869 Arthritis due to other bacteria, unspecified knee: Secondary | ICD-10-CM | POA: Diagnosis not present

## 2020-04-28 DIAGNOSIS — T826XXA Infection and inflammatory reaction due to cardiac valve prosthesis, initial encounter: Secondary | ICD-10-CM | POA: Diagnosis not present

## 2020-04-28 DIAGNOSIS — I340 Nonrheumatic mitral (valve) insufficiency: Secondary | ICD-10-CM | POA: Diagnosis not present

## 2020-04-28 DIAGNOSIS — Z981 Arthrodesis status: Secondary | ICD-10-CM | POA: Diagnosis not present

## 2020-04-28 DIAGNOSIS — M00861 Arthritis due to other bacteria, right knee: Secondary | ICD-10-CM | POA: Diagnosis not present

## 2020-04-28 DIAGNOSIS — N179 Acute kidney failure, unspecified: Secondary | ICD-10-CM | POA: Diagnosis not present

## 2020-04-28 DIAGNOSIS — Z79899 Other long term (current) drug therapy: Secondary | ICD-10-CM | POA: Diagnosis not present

## 2020-04-28 DIAGNOSIS — M25562 Pain in left knee: Secondary | ICD-10-CM | POA: Diagnosis not present

## 2020-04-28 DIAGNOSIS — I4891 Unspecified atrial fibrillation: Secondary | ICD-10-CM | POA: Diagnosis not present

## 2020-04-28 DIAGNOSIS — Z7901 Long term (current) use of anticoagulants: Secondary | ICD-10-CM | POA: Diagnosis not present

## 2020-04-28 DIAGNOSIS — I4819 Other persistent atrial fibrillation: Secondary | ICD-10-CM | POA: Diagnosis not present

## 2020-04-28 DIAGNOSIS — E119 Type 2 diabetes mellitus without complications: Secondary | ICD-10-CM | POA: Diagnosis not present

## 2020-04-28 DIAGNOSIS — M23222 Derangement of posterior horn of medial meniscus due to old tear or injury, left knee: Secondary | ICD-10-CM | POA: Diagnosis not present

## 2020-04-28 DIAGNOSIS — I129 Hypertensive chronic kidney disease with stage 1 through stage 4 chronic kidney disease, or unspecified chronic kidney disease: Secondary | ICD-10-CM | POA: Diagnosis not present

## 2020-04-28 DIAGNOSIS — I361 Nonrheumatic tricuspid (valve) insufficiency: Secondary | ICD-10-CM | POA: Diagnosis not present

## 2020-04-28 DIAGNOSIS — M25561 Pain in right knee: Secondary | ICD-10-CM | POA: Diagnosis not present

## 2020-04-28 DIAGNOSIS — K5900 Constipation, unspecified: Secondary | ICD-10-CM | POA: Diagnosis not present

## 2020-04-28 DIAGNOSIS — D62 Acute posthemorrhagic anemia: Secondary | ICD-10-CM | POA: Diagnosis not present

## 2020-04-28 DIAGNOSIS — E1169 Type 2 diabetes mellitus with other specified complication: Secondary | ICD-10-CM | POA: Diagnosis present

## 2020-04-28 DIAGNOSIS — Z953 Presence of xenogenic heart valve: Secondary | ICD-10-CM | POA: Diagnosis not present

## 2020-04-28 DIAGNOSIS — G061 Intraspinal abscess and granuloma: Secondary | ICD-10-CM | POA: Diagnosis not present

## 2020-04-28 DIAGNOSIS — K6812 Psoas muscle abscess: Secondary | ICD-10-CM | POA: Diagnosis not present

## 2020-04-28 DIAGNOSIS — K219 Gastro-esophageal reflux disease without esophagitis: Secondary | ICD-10-CM | POA: Diagnosis not present

## 2020-04-28 DIAGNOSIS — M23352 Other meniscus derangements, posterior horn of lateral meniscus, left knee: Secondary | ICD-10-CM | POA: Diagnosis not present

## 2020-04-28 DIAGNOSIS — E43 Unspecified severe protein-calorie malnutrition: Secondary | ICD-10-CM | POA: Diagnosis not present

## 2020-04-28 DIAGNOSIS — E1122 Type 2 diabetes mellitus with diabetic chronic kidney disease: Secondary | ICD-10-CM | POA: Diagnosis present

## 2020-04-28 DIAGNOSIS — M00862 Arthritis due to other bacteria, left knee: Secondary | ICD-10-CM | POA: Diagnosis not present

## 2020-04-28 DIAGNOSIS — M009 Pyogenic arthritis, unspecified: Secondary | ICD-10-CM | POA: Diagnosis not present

## 2020-04-28 DIAGNOSIS — Z794 Long term (current) use of insulin: Secondary | ICD-10-CM | POA: Diagnosis not present

## 2020-04-28 DIAGNOSIS — E039 Hypothyroidism, unspecified: Secondary | ICD-10-CM | POA: Diagnosis not present

## 2020-04-28 DIAGNOSIS — E1151 Type 2 diabetes mellitus with diabetic peripheral angiopathy without gangrene: Secondary | ICD-10-CM | POA: Diagnosis present

## 2020-04-28 DIAGNOSIS — R7881 Bacteremia: Secondary | ICD-10-CM | POA: Diagnosis not present

## 2020-04-28 DIAGNOSIS — M1711 Unilateral primary osteoarthritis, right knee: Secondary | ICD-10-CM | POA: Diagnosis not present

## 2020-04-28 DIAGNOSIS — I330 Acute and subacute infective endocarditis: Secondary | ICD-10-CM | POA: Diagnosis not present

## 2020-04-28 DIAGNOSIS — M4646 Discitis, unspecified, lumbar region: Secondary | ICD-10-CM | POA: Diagnosis not present

## 2020-04-28 DIAGNOSIS — I480 Paroxysmal atrial fibrillation: Secondary | ICD-10-CM | POA: Diagnosis not present

## 2020-04-28 DIAGNOSIS — Y831 Surgical operation with implant of artificial internal device as the cause of abnormal reaction of the patient, or of later complication, without mention of misadventure at the time of the procedure: Secondary | ICD-10-CM | POA: Diagnosis present

## 2020-04-28 DIAGNOSIS — B952 Enterococcus as the cause of diseases classified elsewhere: Secondary | ICD-10-CM | POA: Diagnosis present

## 2020-04-28 DIAGNOSIS — I10 Essential (primary) hypertension: Secondary | ICD-10-CM | POA: Diagnosis not present

## 2020-04-28 DIAGNOSIS — M4647 Discitis, unspecified, lumbosacral region: Secondary | ICD-10-CM | POA: Diagnosis not present

## 2020-04-28 DIAGNOSIS — M4626 Osteomyelitis of vertebra, lumbar region: Secondary | ICD-10-CM | POA: Diagnosis not present

## 2020-04-28 DIAGNOSIS — M25461 Effusion, right knee: Secondary | ICD-10-CM | POA: Diagnosis not present

## 2020-04-28 DIAGNOSIS — N183 Chronic kidney disease, stage 3 unspecified: Secondary | ICD-10-CM | POA: Diagnosis not present

## 2020-04-28 LAB — GLUCOSE, CAPILLARY
Glucose-Capillary: 120 mg/dL — ABNORMAL HIGH (ref 70–99)
Glucose-Capillary: 107 mg/dL — ABNORMAL HIGH (ref 70–99)
Glucose-Capillary: 173 mg/dL — ABNORMAL HIGH (ref 70–99)
Glucose-Capillary: 108 mg/dL — ABNORMAL HIGH (ref 70–99)

## 2020-04-28 LAB — CULTURE, FUNGUS WITHOUT SMEAR: Special Requests: NORMAL

## 2020-04-28 MED ORDER — AMIODARONE HCL IN DEXTROSE 360-4.14 MG/200ML-% IV SOLN
30.0000 mg/h | INTRAVENOUS | Status: DC
  Administered 2020-04-28 – 2020-04-30 (×4): 30 mg/h via INTRAVENOUS
  Filled 2020-04-28 (×3): qty 200

## 2020-04-28 MED ORDER — AMIODARONE HCL IN DEXTROSE 360-4.14 MG/200ML-% IV SOLN
INTRAVENOUS | Status: AC
  Filled 2020-04-28: qty 200

## 2020-04-28 MED ORDER — AMIODARONE HCL 200 MG PO TABS
200.0000 mg | ORAL_TABLET | Freq: Two times a day (BID) | ORAL | Status: DC
  Administered 2020-04-28: 200 mg via ORAL
  Filled 2020-04-28: qty 1

## 2020-04-28 MED ORDER — AMIODARONE HCL IN DEXTROSE 360-4.14 MG/200ML-% IV SOLN
60.0000 mg/h | INTRAVENOUS | Status: DC
  Administered 2020-04-28 (×2): 60 mg/h via INTRAVENOUS
  Filled 2020-04-28: qty 200

## 2020-04-28 MED ORDER — AMIODARONE IV BOLUS ONLY 150 MG/100ML
150.0000 mg | Freq: Once | INTRAVENOUS | Status: AC
  Administered 2020-04-28: 150 mg via INTRAVENOUS
  Filled 2020-04-28: qty 100

## 2020-04-28 NOTE — Progress Notes (Signed)
Inpatient Rehab Admissions Coordinator:   Met with patient at bedside and spoke to [facility], [place].  Plan for TEE/DCCV tomorrow per Dr. Audie Box.  Will hold admit again today and f/u with patient on Thursday.   Shann Medal, PT, DPT Admissions Coordinator [phone] 04/28/20  12:17 PM

## 2020-04-28 NOTE — Progress Notes (Signed)
  Amiodarone Drug - Drug Interaction Consult Note  Recommendations: No current drug-drug interactions found.    Amiodarone is metabolized by the cytochrome P450 system and therefore has the potential to cause many drug interactions. Amiodarone has an average plasma half-life of 50 days (range 20 to 100 days).   There is potential for drug interactions to occur several weeks or months after stopping treatment and the onset of drug interactions may be slow after initiating amiodarone.   []  Statins: Increased risk of myopathy. Simvastatin- restrict dose to 20mg  daily. Other statins: counsel patients to report any muscle pain or weakness immediately.  []  Anticoagulants: Amiodarone can increase anticoagulant effect. Consider warfarin dose reduction. Patients should be monitored closely and the dose of anticoagulant altered accordingly, remembering that amiodarone levels take several weeks to stabilize.  []  Antiepileptics: Amiodarone can increase plasma concentration of phenytoin, the dose should be reduced. Note that small changes in phenytoin dose can result in large changes in levels. Monitor patient and counsel on signs of toxicity.  []  Beta blockers: increased risk of bradycardia, AV block and myocardial depression. Sotalol - avoid concomitant use.  []   Calcium channel blockers (diltiazem and verapamil): increased risk of bradycardia, AV block and myocardial depression.  []   Cyclosporine: Amiodarone increases levels of cyclosporine. Reduced dose of cyclosporine is recommended.  []  Digoxin dose should be halved when amiodarone is started.  []  Diuretics: increased risk of cardiotoxicity if hypokalemia occurs.  []  Oral hypoglycemic agents (glyburide, glipizide, glimepiride): increased risk of hypoglycemia. Patient's glucose levels should be monitored closely when initiating amiodarone therapy.   []  Drugs that prolong the QT interval:  Torsades de pointes risk may be increased with concurrent  use - avoid if possible.  Monitor QTc, also keep magnesium/potassium WNL if concurrent therapy can't be avoided. Marland Kitchen Antibiotics: e.g. fluoroquinolones, erythromycin. . Antiarrhythmics: e.g. quinidine, procainamide, disopyramide, sotalol. . Antipsychotics: e.g. phenothiazines, haloperidol.  . Lithium, tricyclic antidepressants, and methadone.  Thank You,  Nicole Cella, RPh Clinical Pharmacist 04/28/2020 12:30 PM

## 2020-04-28 NOTE — Progress Notes (Addendum)
      [address]       [place] [address]             [phone]      12 Days Post-Op Procedure(s) (LRB): REDO STERNOTOMY (N/A) ASCENDING AORTIC ROOT REPLACEMENT - HOMOGRAFT using [employer] 23 MM Cryo Aortic Valve. (N/A) TRANSESOPHAGEAL ECHOCARDIOGRAM (TEE) (N/A) Subjective: Feels good this morning. Was disappointed that he didn't go to rehab yesterday.    Objective: Vital signs in last 24 hours: Temp:  [98.1 F (36.7 C)-98.6 F (37 C)] 98.1 F (36.7 C) (06/08 0409) Pulse Rate:  [81-111] 93 (06/08 0409) Cardiac Rhythm: Atrial fibrillation (06/07 2030) Resp:  [11-20] 13 (06/08 0409) BP: (100-128)/(65-84) 111/84 (06/08 0409) SpO2:  [94 %-99 %] 99 % (06/08 0409) Weight:  [81.3 kg] 81.3 kg (06/08 0409)     Intake/Output from previous day: 06/07 0701 - 06/08 0700 In: 240 [P.O.:240] Out: 1350 [Urine:1350] Intake/Output this shift: No intake/output data recorded.  General appearance: alert, cooperative and no distress Heart: irregularly irregular rhythm Lungs: clear to auscultation bilaterally Abdomen: soft, non-tender; bowel sounds normal; no masses,  no organomegaly Extremities: extremities normal, atraumatic, no cyanosis or edema Wound: clean and dry  Lab Results: Recent Labs    04/26/20 0315  WBC 9.4  HGB 8.6*  HCT 28.0*  PLT 321   BMET:  Recent Labs    04/26/20 0315  NA 137  K 4.0  CL 99  CO2 26  GLUCOSE 176*  BUN 19  CREATININE 1.39*  CALCIUM 8.9    PT/INR: No results for input(s): LABPROT, INR in the last 72 hours. ABG    Component Value Date/Time   PHART 7.409 04/16/2020 2343   HCO3 25.0 04/16/2020 2343   TCO2 26 04/16/2020 2343   O2SAT 96.0 04/16/2020 2343   CBG (last 3)  Recent Labs    04/27/20 1635 04/27/20 2114 04/28/20 0647  GLUCAP 135* 164* 120*    Assessment/Plan: S/P Procedure(s) (LRB): REDO STERNOTOMY (N/A) ASCENDING AORTIC ROOT REPLACEMENT - HOMOGRAFT using [employer] 23 MM Cryo Aortic  Valve. (N/A) TRANSESOPHAGEAL ECHOCARDIOGRAM (TEE) (N/A)  1. CV-previous afib a few days ago, back in afib with RVR yesterday. Continue Amio 200mg  daily, continue BB.  BP well controlled 2. Pulm- tolerating room air with good oxygen saturation 3. Renal-CXR yesterday stable with small pleural effusions.  4. H and h 8.6/28.0, expected acute blood loss anemia 5. Endo-blood glucose well controlled.  6. ID-on Ampicillin and Ceftriaxone for disseminated Enterococcus Faecalis 7. Spinal osteo/discitis with small psoas abscesses and probable myositis in paraspinous muscles related to enterococcal bacteremia  Plan: Remains in afib but rate controlled this morning. Eliquis started yesterday. Rhythm needs to be stable before transfer to CIR but should be okay to go today.    LOS: 15 days    Elgie Collard 04/28/2020   Chart reviewed, patient examined, agree with above. Remains in atrial fib. Rate 100-110 this am. Will give him a bolus of amio and increase to 200 bid po. I think he could go to rehab when bed available. His HR is controlled and he may continue to have atrial fib for a while. He had this preop.

## 2020-04-28 NOTE — Progress Notes (Signed)
Mobility Specialist: Progress Note    04/28/20 1730  Mobility  Activity Ambulated in room  Level of Assistance Moderate assist, patient does 50-74%  Assistive Device Front wheel walker  Distance Ambulated (ft) 10 ft  Mobility Response Tolerated fair  Mobility performed by Mobility specialist  $Mobility charge 1 Mobility   Pre-Mobility: 77 HR, 106/63 BP, 97% SpO2 Post-Mobility: 102 HR, 125/80 BP, 99% SpO2  Pt tolerated ambulation fair. Pt walked from one side of the bed to the other. Pt c/o of hip pain before and during ambulation. Pain resolved after getting back in the bed.   [facility] Sharnae Winfree Mobility Specialist

## 2020-04-28 NOTE — Progress Notes (Signed)
Cardiology Progress Note  Patient ID: Johnathan Murray MRN: [id number] DOB: 1951/05/13 Date of Encounter: 04/28/2020  Primary Cardiologist: Jenean Lindau, MD  Subjective   Chief Complaint: Afib  HPI: Back in Afib since 8 am yesterday. Anticoagulation started yesterday. Reports he is tired and fatigued and aware his heart races.   ROS:  All other ROS reviewed and negative. Pertinent positives noted in the HPI.     Inpatient Medications  Scheduled Meds: . apixaban  5 mg Oral BID  . vitamin C  500 mg Oral Daily  . aspirin EC  81 mg Oral Daily  . bisacodyl  10 mg Oral Daily   Or  . bisacodyl  10 mg Rectal Daily  . Chlorhexidine Gluconate Cloth  6 each Topical Daily  . diphenhydrAMINE  25 mg Oral QHS  . docusate sodium  200 mg Oral Daily  . feeding supplement (ENSURE ENLIVE)  237 mL Oral TID BM  . ferrous [id number] C-folic acid  1 capsule Oral BID PC  . insulin aspart  0-24 Units Subcutaneous TID WC  . insulin detemir  8 Units Subcutaneous Daily  . levothyroxine  25 mcg Oral QAC breakfast  . metoprolol tartrate  25 mg Oral BID  . oxyCODONE  10 mg Oral Q12H  . pantoprazole  40 mg Oral Daily  . polyethylene glycol  17 g Oral Daily  . pregabalin  100 mg Oral TID  . sodium chloride flush  10-40 mL Intracatheter Q12H  . sodium chloride flush  3 mL Intravenous Q12H   Continuous Infusions: . sodium chloride Stopped (04/18/20 1418)  . sodium chloride    . sodium chloride    . amiodarone    . amiodarone     Followed by  . amiodarone    . ampicillin IVPB 2 gram/NS 100 mL (Mini-Bag Plus) 2 g (04/28/20 0902)  . cefTRIAXone (ROCEPHIN) IVPB 2 gram/100 mL NS (Mini-Bag Plus) 2 g (04/28/20 1055)  . lactated ringers Stopped (04/16/20 2018)   PRN Meds: sodium chloride, sodium chloride, cyclobenzaprine, dextrose, morphine injection, ondansetron (ZOFRAN) IV, sodium chloride flush, sodium chloride flush, traMADol   Vital Signs   Vitals:   04/28/20 0100 04/28/20 0409  04/28/20 0841 04/28/20 0922  BP:  111/84 113/73 113/73  Pulse:  93 100 99  Resp: 16 13 19    Temp:  98.1 F (36.7 C) 98.2 F (36.8 C)   TempSrc:  Oral Oral   SpO2:  99% 99%   Weight:  81.3 kg    Height:        Intake/Output Summary (Last 24 hours) at 04/28/2020 1155 Last data filed at 04/28/2020 0844 Gross per 24 hour  Intake 240 ml  Output 1550 ml  Net -1310 ml   Last 3 Weights 04/28/2020 04/27/2020 04/26/2020  Weight (lbs) 179 lb 3.7 oz 175 lb 0.7 oz 176 lb 2.4 oz  Weight (kg) 81.3 kg 79.4 kg 79.9 kg      Telemetry  Overnight telemetry shows Afib with RVR 120s, which I personally reviewed.   ECG  The most recent ECG shows Afib with RVR 103 bpm, which I personally reviewed.   Physical Exam   Vitals:   04/28/20 0100 04/28/20 0409 04/28/20 0841 04/28/20 0922  BP:  111/84 113/73 113/73  Pulse:  93 100 99  Resp: 16 13 19    Temp:  98.1 F (36.7 C) 98.2 F (36.8 C)   TempSrc:  Oral Oral   SpO2:  99% 99%   Weight:  81.3  kg    Height:         Intake/Output Summary (Last 24 hours) at 04/28/2020 1155 Last data filed at 04/28/2020 0844 Gross per 24 hour  Intake 240 ml  Output 1550 ml  Net -1310 ml    Last 3 Weights 04/28/2020 04/27/2020 04/26/2020  Weight (lbs) 179 lb 3.7 oz 175 lb 0.7 oz 176 lb 2.4 oz  Weight (kg) 81.3 kg 79.4 kg 79.9 kg    Body mass index is 25.72 kg/m.  General: Well nourished, well developed, in no acute distress Head: Atraumatic, normal size  Eyes: PEERLA, EOMI  Neck: Supple, no JVD Endocrine: No thryomegaly Cardiac: irregular rhythm, no m/r/g Lungs: Clear to auscultation bilaterally, no wheezing, rhonchi or rales  Abd: Soft, nontender, no hepatomegaly  Ext: No edema, pulses 2+ Musculoskeletal: No deformities, BUE and BLE strength normal and equal Skin: Warm and dry, no rashes   Neuro: Alert and oriented to person, place, time, and situation, CNII-XII grossly intact, no focal deficits  Psych: Normal mood and affect   Labs  High Sensitivity Troponin:   No results for input(s): TROPONINIHS in the last 720 hours.   Cardiac EnzymesNo results for input(s): TROPONINI in the last 168 hours. No results for input(s): TROPIPOC in the last 168 hours.  Chemistry Recent Labs  Lab 04/23/20 0500 04/24/20 0524 04/26/20 0315  NA 141 138 137  K 4.1 3.9 4.0  CL 104 102 99  CO2 28 27 26   GLUCOSE 125* 109* 176*  BUN 20 17 19   CREATININE 1.35* 1.35* 1.39*  CALCIUM 9.1 8.6* 8.9  GFRNONAA 54* 54* 52*  GFRAA >60 >60 >60  ANIONGAP 9 9 12     Hematology Recent Labs  Lab 04/26/20 0315  WBC 9.4  RBC 2.92*  HGB 8.6*  HCT 28.0*  MCV 95.9  MCH 29.5  MCHC 30.7  RDW 15.4  PLT 321   BNPNo results for input(s): BNP, PROBNP in the last 168 hours.  DDimer No results for input(s): DDIMER in the last 168 hours.   Radiology  No results found.  Cardiac Studies  TEE 04/13/2020  1. There is a small vegetation attached to the leaflet of the [place] that  measures 0.8 cm x 0.5 cm. There is a pseudoaneurysm located in the  anterior aspect of the aortic root, which is actually best seen on the  supplemental TTE images. There is no  fistulous connection with the LVOT and no apparent regurgitation. The  color flow appears to be in and out of the pseudoaneurysm. 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 A cardiac CT is  recommended to better characterize the location and extent of the  pseudoaneurysm. The aortic valve has been repaired/replaced. Aortic valve  regurgitation is not visualized. Aortic valve mean gradient measures 13.0  mmHg. Aortic valve Vmax measures 2.32  m/s.  2. Left ventricular ejection fraction, by estimation, is 50 to 55%. The  left ventricle has low normal function. The left ventricle has no regional  wall motion abnormalities.  3. Right ventricular systolic function is normal. The right ventricular  size is normal.  4. No left atrial/left atrial appendage thrombus was  detected. The LAA  emptying velocity was 56 cm/s.  5. The mitral valve is grossly normal. Mild mitral valve regurgitation.  No evidence of mitral stenosis.  6. There is a pseudoaneurysm of the aortic root anterior to the RCC with  clear color flow into the affected area.  Aortic root abscess present.  There is mild (Grade II) layered plaque involving the descending aorta.   Patient Profile  Johnathan Murray is a 69 y.o. male with history of CAD s/p CABG, AVR, pAF, DM who was admitted with sepsis and found to have aortic root abscess. He is s/p aortic valve replacement and aortic repair (23 mm homograft root replacement with coronary reimplant 04/16/2020. He was cardioverted initially for Aflutter 04/13/2020 and now developed Afib 04/27/2020.   Assessment & Plan   1. Atrial fibrillation with RVR -suspect postop afib. Had issues with flutter prior to finding aortic root abscess. We will plan for IV amiodarone drip today and then TEE/DCCV tomorrow.  -NPO at midnight -started eliquis 5 mg BID yesterday and should be good for tomorrow -we will transition back to oral amiodarone tomorrow  For questions or updates, please contact [place] Please consult [email or web address] for contact info under   Time Spent with Patient: I have spent a total of 25 minutes with patient reviewing hospital notes, telemetry, EKGs, labs and examining the patient as well as establishing an assessment and plan that was discussed with the patient.  > 50% of time was spent in direct patient care.    Signed, Addison Naegeli. Audie Box, [place]  04/28/2020 11:55 AM

## 2020-04-28 NOTE — TOC Transition Note (Addendum)
Transition of Care [name]) - CM/SW Discharge Note Marvetta Gibbons RN, BSN Transitions of Care Unit 4E- RN Case Manager [phone]   Patient Details  Name: Johnathan Murray MRN: [id number] Date of Birth: [birth year]  Transition of Care [facility]) CM/SW Contact:  [name], RN Phone Number: 04/28/2020, 11:24 AM   Clinical Narrative:    Pt re-admitted from [name]- goal is to return home with wife. Pt will need 6 wks of IV abx- PICC line has been placed- Pt has hx with [facility] for [facility] needs.  CIR has received new insurance auth per [name] with CIR- bed available today and [facility] re[facility] ready to transition pt to [name] later today.   1230- update- noted pt now scheduled for cardioversion tomorrow, admission to CIR no hold until after procedure- CIR AC will f/u on Thursday 6/10.    Final next level of care: IP Rehab Facility Barriers to Discharge: No Barriers Identified   Patient Goals and CMS Choice Patient states their goals for this hospitalization and ongoing recovery are:: return to rehab CMS Medicare.gov Compare Post Acute Care list provided to:: Patient    Discharge Placement               [facility]        Discharge Plan and Services                                     Social Determinants of Health (SDOH) Interventions     Readmission Risk Interventions Readmission Risk Prevention Plan 04/28/2020  Transportation Screening (No Data)  Social Work Consult for [name] Planning/Counseling [place] Not Applicable  Medication Review Press photographer) Complete  Some recent data might be hidden

## 2020-04-28 NOTE — Progress Notes (Signed)
Inpatient Rehab Admissions Coordinator:   I have a bed available and approval from acute medical team to admit pt today.  Will let pt/family and TOC team know.   Shann Medal, PT, DPT Admissions Coordinator [phone] 04/28/20  10:09 AM

## 2020-04-28 NOTE — Progress Notes (Signed)
Occupational Therapy Treatment Patient Details Name: Johnathan Murray MRN: [id number] DOB: 05/27/1951 Today's Date: 04/28/2020    History of present illness Pt transferred from CIR on 5/24 after he developed afib with RVR and found to have new aortic root abscess with vegetations and prosthetic aortic valve endocarditis. Underwent  aortic root replacement on 04/16/20. Pt originally admitted 4/30 with acute discitis L4-5 and bil septic knees. Transferred to CIR on 5/18. PMH - ACDF on 4/12, back surg 05/2019, CAD, CABG, DM, HTN.   OT comments  Patient continues to make steady progress towards goals in skilled OT session. Patient's session encompassed transfer OOB to chair and then back to bed due to significant pain in low back. Pt able to demonstrate appropriate bed mobility and transfer skills, however experienced significant pain when transitioning to sit. Pt and therapist attempted to reposition, but to no avail and pt requesting to get back in bed with RN providing pain medication. Pt repositioned in bed with all needs met. Discharge remains appropriate; will continue to follow acutely.    Follow Up Recommendations  CIR;Supervision/Assistance - 24 hour    Equipment Recommendations  None recommended by OT    Recommendations for Other Services      Precautions / Restrictions Precautions Precautions: Sternal;Fall;Cervical Precaution Comments: ACDF on 4/12, pt reports they d/c-ed collar 1 week after surgery Other Brace: for comfort, no brace in room Restrictions Weight Bearing Restrictions: Yes Other Position/Activity Restrictions: sternal precautions       Mobility Bed Mobility Overal bed mobility: Needs Assistance Bed Mobility: Rolling;Sidelying to Sit;Sit to Sidelying Rolling: Min guard Sidelying to sit: Modified independent (Device/Increase time)          Transfers Overall transfer level: Needs assistance Equipment used: Rolling walker (2 wheeled) Transfers: Sit to/from  Stand Sit to Stand: Mod assist;From elevated surface;Max assist         General transfer comment: Pt skepitcal in therapist's abilities to trasfer pt, however with bed raised minimally was able to transfer to chair, however pain was too great in sitting despite repositioning in chair and required max A of one to return to bed    Balance Overall balance assessment: Needs assistance Sitting-balance support: Feet supported;Single extremity supported Sitting balance-Leahy Scale: Good     Standing balance support: During functional activity;Bilateral upper extremity supported Standing balance-Leahy Scale: Poor Standing balance comment: able to stand, however greatly limited by pain                           ADL either performed or assessed with clinical judgement   ADL Overall ADL's : Needs assistance/impaired                                     Functional mobility during ADLs: +2 for physical assistance;Moderate assistance General ADL Comments: Session focus on transfers OOB, greatly limited by pain in session     Vision       Perception     Praxis      Cognition Arousal/Alertness: Awake/alert Behavior During Therapy: WFL for tasks assessed/performed Overall Cognitive Status: Within Functional Limits for tasks assessed                                          Exercises       Shoulder Instructions       General Comments      Pertinent Vitals/ Pain       Pain Assessment: Faces Faces Pain Scale: Hurts worst Pain Location: Low back Pain Descriptors / Indicators: Grimacing;Guarding;Moaning;Sore;Discomfort;Crushing Pain Intervention(s): Limited activity within patient's tolerance;Monitored during session;Repositioned;Patient requesting pain meds-RN notified;RN gave pain meds during session  Home Living                                          Prior Functioning/Environment              Frequency  Min  2X/week        Progress Toward Goals  OT Goals(current goals can now be found in the care plan section)  Progress towards OT goals: Progressing toward goals  Acute Rehab OT Goals Patient Stated Goal: to get stronger and go back to work OT Goal Formulation: With patient Time For Goal Achievement: 05/04/20 Potential to Achieve Goals: Good  Plan Discharge plan remains appropriate    Co-evaluation                 AM-PAC OT "6 Clicks" Daily Activity     Outcome Measure   Help from another person eating meals?: None Help from another person taking care of personal grooming?: A Little Help from another person toileting, which includes using toliet, bedpan, or urinal?: A Lot Help from another person bathing (including washing, rinsing, drying)?: A Lot Help from another person to put on and taking off regular upper body clothing?: A Little Help from another person to put on and taking off regular lower body clothing?: A Lot 6 Click Score: 16    End of Session Equipment Utilized During Treatment: Gait belt;Rolling walker  OT Visit Diagnosis: Unsteadiness on feet (R26.81);Other abnormalities of gait and mobility (R26.89);Muscle weakness (generalized) (M62.81);Other symptoms and signs involving cognitive function;Pain Pain - part of body: (Low back)   Activity Tolerance Patient limited by pain   Patient Left in bed;with call bell/phone within reach;with nursing/sitter in room   Nurse Communication Mobility status;Patient requests pain meds        Time: 1200-1222 OT Time Calculation (min): 22 min  Charges: OT General Charges $OT Visit: 1 Visit OT Treatments $Self Care/Home Management : 8-22 mins  Mary Kate E. Young, COTA/L Acute Rehabilitation Services [phone]   Mary Kate Young 04/28/2020, 2:04 PM    

## 2020-04-29 ENCOUNTER — Inpatient Hospital Stay (HOSPITAL_COMMUNITY): Payer: BC Managed Care – PPO

## 2020-04-29 ENCOUNTER — Encounter (HOSPITAL_COMMUNITY)
Admission: AD | Disposition: A | Discharge status: Rehabilitation Facility | Payer: Self-pay | Source: Ambulatory Visit | Attending: Surgery

## 2020-04-29 ENCOUNTER — Inpatient Hospital Stay (HOSPITAL_COMMUNITY): Payer: BC Managed Care – PPO | Admitting: Anesthesiology

## 2020-04-29 ENCOUNTER — Encounter (HOSPITAL_COMMUNITY): Payer: Self-pay | Admitting: Internal Medicine

## 2020-04-29 DIAGNOSIS — I340 Nonrheumatic mitral (valve) insufficiency: Secondary | ICD-10-CM

## 2020-04-29 DIAGNOSIS — I361 Nonrheumatic tricuspid (valve) insufficiency: Secondary | ICD-10-CM

## 2020-04-29 DIAGNOSIS — I4819 Other persistent atrial fibrillation: Secondary | ICD-10-CM

## 2020-04-29 HISTORY — PX: CARDIOVERSION: SHX1299

## 2020-04-29 HISTORY — PX: TEE WITHOUT CARDIOVERSION: SHX5443

## 2020-04-29 LAB — BASIC METABOLIC PANEL
Anion gap: 10 (ref 5–15)
BUN: 16 mg/dL (ref 8–23)
CO2: 25 mmol/L (ref 22–32)
Calcium: 8.7 mg/dL — ABNORMAL LOW (ref 8.9–10.3)
Chloride: 102 mmol/L (ref 98–111)
Creatinine, Ser: 1.28 mg/dL — ABNORMAL HIGH (ref 0.61–1.24)
GFR calc Af Amer: >60 mL/min (ref >60)
GFR calc non Af Amer: 58 mL/min — ABNORMAL LOW (ref >60)
Glucose, Bld: 121 mg/dL — ABNORMAL HIGH (ref 70–99)
Potassium: 3.7 mmol/L (ref 3.5–5.1)
Sodium: 137 mmol/L (ref 135–145)

## 2020-04-29 LAB — CBC
HCT: 27.1 % — ABNORMAL LOW (ref 39.0–52.0)
Hemoglobin: 8.3 g/dL — ABNORMAL LOW (ref 13.0–17.0)
MCH: 29.2 pg (ref 26.0–34.0)
MCHC: 30.6 g/dL (ref 30.0–36.0)
MCV: 95.4 fL (ref 80.0–100.0)
Platelets: 328 10*3/uL (ref 150–400)
RBC: 2.84 MIL/uL — ABNORMAL LOW (ref 4.22–5.81)
RDW: 14.9 % (ref 11.5–15.5)
WBC: 7.8 10*3/uL (ref 4.0–10.5)
nRBC: 0 % (ref 0.0–0.2)

## 2020-04-29 LAB — GLUCOSE, CAPILLARY
Glucose-Capillary: 124 mg/dL — ABNORMAL HIGH (ref 70–99)
Glucose-Capillary: 119 mg/dL — ABNORMAL HIGH (ref 70–99)
Glucose-Capillary: 155 mg/dL — ABNORMAL HIGH (ref 70–99)
Glucose-Capillary: 178 mg/dL — ABNORMAL HIGH (ref 70–99)

## 2020-04-29 SURGERY — ECHOCARDIOGRAM, TRANSESOPHAGEAL
Anesthesia: General

## 2020-04-29 MED ORDER — PHENYLEPHRINE 40 MCG/ML (10ML) SYRINGE FOR IV PUSH (FOR BLOOD PRESSURE SUPPORT)
PREFILLED_SYRINGE | INTRAVENOUS | Status: DC | PRN
  Administered 2020-04-29: 80 ug via INTRAVENOUS
  Administered 2020-04-29 (×2): 40 ug via INTRAVENOUS
  Administered 2020-04-29: 80 ug via INTRAVENOUS

## 2020-04-29 MED ORDER — PROPOFOL 500 MG/50ML IV EMUL
INTRAVENOUS | Status: DC | PRN
  Administered 2020-04-29: 100 ug/kg/min via INTRAVENOUS

## 2020-04-29 NOTE — Progress Notes (Signed)
ekg post procedure completed

## 2020-04-29 NOTE — H&P (View-Only) (Signed)
Progress Note  Patient Name: Johnathan Murray Date of Encounter: 04/29/2020  [facility] Cardiologist: Jenean Lindau, MD   Subjective   Plan for TEE/DCCV today. Afib with rates in the 90s. Some borderline pressures. Patient did not sleep well, feels very tired. No chest pain but can feel the afib.  Inpatient Medications    Scheduled Meds: . apixaban  5 mg Oral BID  . vitamin C  500 mg Oral Daily  . aspirin EC  81 mg Oral Daily  . bisacodyl  10 mg Oral Daily   Or  . bisacodyl  10 mg Rectal Daily  . Chlorhexidine Gluconate Cloth  6 each Topical Daily  . diphenhydrAMINE  25 mg Oral QHS  . docusate sodium  200 mg Oral Daily  . feeding supplement (ENSURE ENLIVE)  237 mL Oral TID BM  . ferrous [id number] C-folic acid  1 capsule Oral BID PC  . insulin aspart  0-24 Units Subcutaneous TID WC  . insulin detemir  8 Units Subcutaneous Daily  . levothyroxine  25 mcg Oral QAC breakfast  . metoprolol tartrate  25 mg Oral BID  . oxyCODONE  10 mg Oral Q12H  . pantoprazole  40 mg Oral Daily  . polyethylene glycol  17 g Oral Daily  . pregabalin  100 mg Oral TID  . sodium chloride flush  10-40 mL Intracatheter Q12H  . sodium chloride flush  3 mL Intravenous Q12H   Continuous Infusions: . sodium chloride Stopped (04/18/20 1418)  . sodium chloride    . sodium chloride 500 mL (04/28/20 2346)  . amiodarone 30 mg/hr (04/29/20 0148)  . ampicillin IVPB 2 gram/NS 100 mL (Mini-Bag Plus) 2 g (04/29/20 0848)  . cefTRIAXone (ROCEPHIN) IVPB 2 gram/100 mL NS (Mini-Bag Plus) 2 g (04/29/20 0011)  . lactated ringers Stopped (04/16/20 2018)   PRN Meds: sodium chloride, sodium chloride, cyclobenzaprine, dextrose, morphine injection, ondansetron (ZOFRAN) IV, sodium chloride flush, sodium chloride flush, traMADol   Vital Signs    Vitals:   04/29/20 0146 04/29/20 0444 04/29/20 0500 04/29/20 0838  BP: 110/79 100/64  109/67  Pulse: 93 95 90 98  Resp: 14 13 18    Temp: 98 F (36.7 C)  98 F (36.7 C)  98.3 F (36.8 C)  TempSrc: Oral Oral  Oral  SpO2: 98% 98% 97% 100%  Weight:   80.6 kg   Height:        Intake/Output Summary (Last 24 hours) at 04/29/2020 0859 Last data filed at 04/29/2020 0848 Gross per 24 hour  Intake 1919.71 ml  Output 3750 ml  Net -1830.29 ml   Last 3 Weights 04/29/2020 04/28/2020 04/27/2020  Weight (lbs) 177 lb 11.1 oz 179 lb 3.7 oz 175 lb 0.7 oz  Weight (kg) 80.6 kg 81.3 kg 79.4 kg      Telemetry    Afib, HR 90s, PVCs - Personally Reviewed  ECG    Afib, 103 bpm - Personally Reviewed  Physical Exam   GEN: No acute distress.   Neck: No JVD Cardiac: Irreg Irreg, no murmurs, rubs, or gallops.  Respiratory: Clear to auscultation bilaterally. GI: Soft, nontender, non-distended  MS: No edema; No deformity. Neuro:  Nonfocal  Psych: Normal affect   Labs    High Sensitivity Troponin:  No results for input(s): TROPONINIHS in the last 720 hours.    Chemistry Recent Labs  Lab 04/24/20 0524 04/26/20 0315 04/29/20 0500  NA 138 137 137  K 3.9 4.0 3.7  CL 102 99 102  CO2 27 26 25   GLUCOSE 109* 176* 121*  BUN 17 19 16   CREATININE 1.35* 1.39* 1.28*  CALCIUM 8.6* 8.9 8.7*  GFRNONAA 54* 52* 58*  GFRAA >60 >60 >60  ANIONGAP 9 12 10      Hematology Recent Labs  Lab 04/26/20 0315 04/29/20 0500  WBC 9.4 7.8  RBC 2.92* 2.84*  HGB 8.6* 8.3*  HCT 28.0* 27.1*  MCV 95.9 95.4  MCH 29.5 29.2  MCHC 30.7 30.6  RDW 15.4 14.9  PLT 321 328    BNPNo results for input(s): BNP, PROBNP in the last 168 hours.   DDimer No results for input(s): DDIMER in the last 168 hours.   Radiology    No results found.  Cardiac Studies   TEE 04/13/2020  1. There is a small vegetation attached to the leaflet of the [place] that  measures 0.8 cm x 0.5 cm. There is a pseudoaneurysm located in the  anterior aspect of the aortic root, which is actually best seen on the  supplemental TTE images. There is no  fistulous connection with the LVOT and no apparent  regurgitation. The  color flow appears to be in and out of the pseudoaneurysm. 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 A cardiac CT is  recommended to better characterize the location and extent of the  pseudoaneurysm. The aortic valve has been repaired/replaced. Aortic valve  regurgitation is not visualized. Aortic valve mean gradient measures 13.0  mmHg. Aortic valve Vmax measures 2.32  m/s.  2. Left ventricular ejection fraction, by estimation, is 50 to 55%. The  left ventricle has low normal function. The left ventricle has no regional  wall motion abnormalities.  3. Right ventricular systolic function is normal. The right ventricular  size is normal.  4. No left atrial/left atrial appendage thrombus was detected. The LAA  emptying velocity was 56 cm/s.  5. The mitral valve is grossly normal. Mild mitral valve regurgitation.  No evidence of mitral stenosis.  6. There is a pseudoaneurysm of the aortic root anterior to the RCC with  clear color flow into the affected area. Aortic root abscess present.  There is mild (Grade II) layered plaque involving the descending aorta.   Cardiac cath 01/21/20  Aortic bioprosthesis was not crossed and therefore hemodynamics in the LV were not recorded.  Patent LIMA to the LAD.  Occluded saphenous vein graft to the marginal.  Occluded saphenous vein graft to the diagonal.  Total occlusion of the mid to proximal LAD.  Patent circumflex with 50% proximal stenosis and segmental diffuse disease extending into the large obtuse marginal.  RCA is dominant.  There is diffuse irregularity noted throughout the proximal to distal vessel with up to 30% in the mid vessel.  RECOMMENDATIONS:   Continue current therapy.  No change in anatomy is noted compared to the prior angiogram performed in 2019.  The abnormal nuclear study with anterolateral ischemia will be a chronic finding and  in absence of symptoms does not warrant repeat invasive investigation.  Patient Profile     69 y.o. male with history of CAD s/p CABG, AVR, pAF, DM who was admitted with sepsis and found to have aortic root abscess. He is s/p aortic valve replacement and aortic repair (23 mm homograft root replacement with coronary reimplant 04/16/2020. He was cardioverted initially for Aflutter 04/13/2020 and now developed Afib 04/27/2020.   Assessment & Plan    Afib with RVR - History of afib  s/p DCCV 04/13/20 now with postop afib  - started on IV amio drip plan to transition to oral - Eliquis 5 mg BID - plan for TEE/DCCV today  For questions or updates, please contact [place] Please consult [email or web address] for contact info under        Signed, Jenice Leiner Ninfa Meeker, PA-C  04/29/2020, 8:59 AM

## 2020-04-29 NOTE — Progress Notes (Signed)
Mobility Specialist: Progress Note    04/29/20 1648  Mobility  Activity Ambulated in room  Level of Assistance Moderate assist, patient does 50-74%  Assistive Device Front wheel walker  Distance Ambulated (ft) 30 ft  Mobility Response Tolerated fair  Mobility performed by Mobility specialist  $Mobility charge 1 Mobility   Pre-Mobility: 71 HR, 108/59 BP, 98% SpO2 Post-Mobility: 93 HR, 142/74 BP, 100% SpO2  Pt tolerated ambulation fair. Pt walked to the door and back today. Pt c/o of R hip and lower back pain during ambulation. Pain resolved after getting back in the bed.   [facility] Lavalle Skoda Mobility Specialist

## 2020-04-29 NOTE — PMR Pre-admission (Signed)
PMR Admission Coordinator Pre-Admission Assessment   Patient: Johnathan Murray is an 68 y.o., male MRN: [id number] DOB: 05/11/1951 Height: 5' 10" (177.8 cm) Weight: 80.6 kg   Insurance Information HMO:     PPO: yes     PCP:      IPA:      80/20:      OTHER:  PRIMARY: [employer] [place]      Policy#: [id number]      Subscriber: pt CM Name: Tonya      Phone#: [phone]     Fax#: [phone] Pre-Cert#: [id number] auth for CIR provided by Tonya with [employer] [place], updates due via fax on 6/17  Employer:  Benefits:  Phone #: [phone]     Name:  Eff. Date: 11/21/2013     Deduct: $600 (met $600)      Out of Pocket Max: $1800 (met $1800)      Life Max: n/a CIR: 80%      SNF: 80% Outpatient:      Co-Pay: $30/visit Home Health: 100%      Co-Pay:  DME: 80%     Co-Pay: 20% Providers:   SECONDARY:       Policy#:      Phone#:     Financial Counselor:       Phone#:    The "Data Collection Information Summary" for patients in Inpatient Rehabilitation Facilities with attached "Privacy Act Statement-Health Care Records" was provided and verbally reviewed with: Patient and Family   Emergency Contact Information         Contact Information     Name Relation Home Work Mobile    Rylee,[name] Spouse [phone]   [phone]         Current Medical History  Patient Admitting Diagnosis: endocarditis, discitis, bilat septic knees   History of Present Illness: Johnathan Murray is a 69-year-old right-handed male history of diabetes mellitus, PAF maintained on amiodarone as well as Eliquis, hypertension, bioprosthetic aortic valve replacement 2017, lumbar laminectomy decompression 06/11/2019 as well as recent C3-4 anterior cervical discectomy with interbody fusion for cervical stenosis myelopathy 03/02/2020 per Dr. Pool.  Presented 03/19/2020 with low-grade fever and weakness of lower extremities.  Lactic acid 1.6, sodium 130, BUN 98, creatinine 6.40 with latest prior creatinine 1.6345 2021, WBC 12,600,  hemoglobin 10.4, CK 20, urinalysis showed RBCs of 21-50 WBC 21-50 protein around 100 bacteria rare hyaline cast, SARS coronavirus negative.  His LFTs were markedly elevated AST 415 ALT 429 total bilirubin 5.5.  CT abdomen pelvis as well as CT cervical spine unremarkable.  MRI cervical spine negative for infection.  Patient was hypotensive received fluid bolus.  Placed on broad-spectrum antibiotics for suspected sepsis.  MRI lumbar spine findings consistent with acute osteomyelitis discitis L4-5.  Evidence for associated soft tissue infection cellulitis within the psoas musculature bilaterally with a few small superimposed abscesses.  Additional multilevel degenerative spondylosis most pronounced at L3-4.  Neurosurgery consulted no plan for surgical intervention underwent aspiration of disc space L4-5.  TEE showed no obvious vegetation and follow-up cardiology services.  Orthopedic services consulted Dr. Landau for bilateral knee pain with noted swelling of left knee.  X-ray showed diffuse degenerative changes as well as large joint effusion.  Doppler showed ruptured Baker's cyst on the left lower extremity.  Patient underwent left lower extremity joint aspiration 03/21/2020 and right lower extremity joint aspiration 03/22/2020 both showing intracellular calcium pyrophosphate crystals WBC was up to 15,000 culture grew rare E Faecalis.  Patient did receive steroid injection   of both knees.  Underwent I&D bilateral knees 03/24/2020.  Infectious disease continue to follow maintained on intravenous ampicillin x7 to 8 weeks through 05/20/2020 PICC line was placed 03/29/2020.  AKI on CKD stage III continue to improve multifactorial from bacteremia with latest creatinine 1.39.  Normocytic anemia postoperative patient was transfused 2 units packed red blood cells with latest hemoglobin 8.6 and WBC 9400.  Patient was admitted to inpatient rehab services 04/07/2020 for ongoing comprehensive inpatient rehab services.  Slow progressive  gains with noted bouts of tachycardia followed by cardiology services.  His amiodarone had since been resumed now that his renal function had improved.  His Eliquis was ongoing as prior to admission.  Patient with bouts of low-grade fever followed by infectious disease.  Ceftriaxone was added to patient's ampicillin for increased coverage.  A follow-up TEE was completed findings of thoracic root abscess and patient was transferred to acute care services for ongoing care 04/13/2020.  Patient underwent redo sternotomy with ascending aortic root replacement 04/16/2020 per Dr. Bartle.  His Eliquis has been resumed.  Patient with persistent bouts of A. fib with RVR undergoing TEE/DCCV 04/29/2020 per cardiology services and was initially placed on IV amiodarone and transition to p.o. amiodarone 200 mg twice daily as well as low-dose beta-blocker and close monitoring of heart rate.  Ceftriaxone ampicillin ongoing again followed by infectious disease through 05/20/2020.  Therapy evaluations completed and patient was recommended for inpatient rehab services.   Patient's medical record from [facility] has been reviewed by the rehabilitation admission coordinator and physician.   Past Medical History      Past Medical History:  Diagnosis Date  . Abnormal LFTs (liver function tests) 10/31/2016  . Abnormal nuclear stress test 05/03/2018  . Acid reflux 02/15/2018    not at present  . Acute otitis externa of left ear 11/13/2019  . Acute suppurative otitis media of left ear without spontaneous rupture of tympanic membrane 11/13/2019  . Anemia      low iron  . Aortic stenosis 05/03/2018  . Aortic valve abscess 04/13/2020  . ARF (acute renal failure) (HCC) 03/20/2020  . Arthritis 02/15/2018    Overview:  shoulder  . Bacteremia due to Enterococcus 03/21/2020  . Cancer (HCC)      skin cancer on left wrist  . Cervical myelopathy (HCC) 04/22/2019  . Conductive hearing loss of left ear 11/13/2019  . Coronary artery  disease involving native coronary artery of native heart without angina pectoris 05/30/2016  . Coronary artery disease of native artery of native heart with stable angina pectoris (HCC)      pt. denies  . Diabetes mellitus due to underlying condition with unspecified complications (HCC) 05/30/2016  . Discitis 04/07/2020  . Dyslipidemia 05/30/2016  . Dyspnea      upon exertion,  as of 06/05/09 not having any recent chest pain  . Dysrhythmia      a fib  . Endocarditis 04/13/2020  . Essential hypertension 05/30/2016  . Heart murmur      Has had an Aortic valve replacement  . History of fusion of cervical spine 03/23/2020  . Hyperactive gag reflex 02/15/2018  . Hypothyroidism    . Intermittent claudication (HCC) 10/31/2018  . Lumbar stenosis with neurogenic claudication 06/11/2019  . Macular degeneration 02/15/2018  . PAF (paroxysmal atrial fibrillation) (HCC) 05/30/2016  . Pre-operative cardiovascular examination 01/03/2020  . Preoperative cardiovascular examination 04/17/2019  . Prosthetic valve endocarditis (HCC) 04/13/2020  . Protein-calorie malnutrition, severe 03/24/2020  . Pseudogout of   left knee 03/21/2020  . Renal insufficiency 01/17/2020  . S/P aortic valve replacement 01/17/2020  . S/P AVR 01/17/2020  . Septic arthritis (HCC) 03/23/2020  . Tinnitus of left ear 11/13/2019  . Typical atrial flutter (HCC)        Family History   family history includes Brain cancer in his mother; Heart disease in his maternal grandmother; Prostate cancer in his brother.   Prior Rehab/Hospitalizations Has the patient had prior rehab or hospitalizations prior to admission? Yes   Has the patient had major surgery during 100 days prior to admission? Yes              Current Medications   Current Facility-Administered Medications:  .  0.45 % sodium chloride infusion, , Intravenous, Continuous PRN, [place], Tiffany, MD, Stopped at 04/18/20 1418 .  0.9 %  sodium chloride infusion, 250 mL, Intravenous, Continuous,  [place], Tiffany, MD .  0.9 %  sodium chloride infusion, 250 mL, Intravenous, PRN, [place], Tiffany, MD, Last Rate: 10 mL/hr at 04/28/20 2346, 500 mL at 04/28/20 2346 .  amiodarone (PACERONE) tablet 400 mg, 400 mg, Oral, BID, Barrett, Erin R, PA-C, 400 mg at 04/30/20 0834 .  ampicillin (OMNIPEN) 2 g in sodium chloride 0.9 % 100 mL IVPB, 2 g, Intravenous, Q4H, [place], Tiffany, MD, Last Rate: 300 mL/hr at 04/30/20 0842, 2 g at 04/30/20 0842 .  apixaban (ELIQUIS) tablet 5 mg, 5 mg, Oral, BID, [place], Tiffany, MD, 5 mg at 04/30/20 0822 .  ascorbic acid (VITAMIN C) tablet 500 mg, 500 mg, Oral, Daily, [place], Tiffany, MD, 500 mg at 04/30/20 0822 .  aspirin EC tablet 81 mg, 81 mg, Oral, Daily, [place], Tiffany, MD, 81 mg at 04/30/20 0823 .  bisacodyl (DULCOLAX) EC tablet 10 mg, 10 mg, Oral, Daily, 10 mg at 04/30/20 0829 **OR** bisacodyl (DULCOLAX) suppository 10 mg, 10 mg, Rectal, Daily, [place], Tiffany, MD, 10 mg at 04/22/20 0915 .  cefTRIAXone (ROCEPHIN) 2 g in sodium chloride 0.9 % 100 mL IVPB, 2 g, Intravenous, Q12H, [place], Tiffany, MD, Last Rate: 200 mL/hr at 04/30/20 1057, 2 g at 04/30/20 1057 .  Chlorhexidine Gluconate Cloth 2 % PADS 6 each, 6 each, Topical, Daily, [place], Tiffany, MD, 6 each at 04/30/20 0847 .  cyclobenzaprine (FLEXERIL) tablet 5 mg, 5 mg, Oral, TID PRN, [place], Tiffany, MD, 5 mg at 04/24/20 1730 .  dextrose 50 % solution 0-50 mL, 0-50 mL, Intravenous, PRN, [place], Tiffany, MD .  diphenhydrAMINE (BENADRYL) capsule 25 mg, 25 mg, Oral, QHS, [place], Tiffany, MD, 25 mg at 04/29/20 2258 .  docusate sodium (COLACE) capsule 200 mg, 200 mg, Oral, Daily, [place], Tiffany, MD, 200 mg at 04/30/20 0824 .  feeding supplement (ENSURE ENLIVE) (ENSURE ENLIVE) liquid 237 mL, 237 mL, Oral, TID BM, [place], Tiffany, MD, 237 mL at 04/30/20 0847 .  ferrous fumarate-b12-vitamic C-folic acid (TRINSICON / FOLTRIN) capsule 1 capsule, 1 capsule, Oral, BID PC, [place], Tiffany, MD, 1  capsule at 04/30/20 0821 .  CBG monitoring, , , 4x Daily, AC & HS **AND** insulin aspart (novoLOG) injection 0-24 Units, 0-24 Units, Subcutaneous, TID WC, [place], Tiffany, MD, 2 Units at 04/30/20 0824 .  insulin detemir (LEVEMIR) injection 8 Units, 8 Units, Subcutaneous, Daily, [place], Tiffany, MD, 8 Units at 04/30/20 0837 .  lactated ringers infusion, , Intravenous, Continuous, [place], Tiffany, MD, Stopped at 04/16/20 2018 .  levothyroxine (SYNTHROID) tablet 25 mcg, 25 mcg, Oral, QAC breakfast, , Tiffany, MD, 25 mcg at 04/30/20 0616 .  metoprolol tartrate (LOPRESSOR) tablet   25 mg, 25 mg, Oral, BID, [place], Tiffany, MD, 25 mg at 04/30/20 0821 .  morphine 2 MG/ML injection 1-2 mg, 1-2 mg, Intravenous, Q6H PRN, [place], Tiffany, MD, 1 mg at 04/29/20 1521 .  ondansetron (ZOFRAN) injection 4 mg, 4 mg, Intravenous, Q6H PRN, [place], Tiffany, MD, 4 mg at 04/18/20 1333 .  oxyCODONE (OXYCONTIN) 12 hr tablet 10 mg, 10 mg, Oral, Q12H, [place], Tiffany, MD, 10 mg at 04/30/20 0822 .  pantoprazole (PROTONIX) EC tablet 40 mg, 40 mg, Oral, Daily, [place], Tiffany, MD, 40 mg at 04/30/20 0821 .  polyethylene glycol (MIRALAX / GLYCOLAX) packet 17 g, 17 g, Oral, Daily, [place], Tiffany, MD, 17 g at 04/30/20 0849 .  pregabalin (LYRICA) capsule 100 mg, 100 mg, Oral, TID, [place], Tiffany, MD, 100 mg at 04/30/20 0823 .  sodium chloride flush (NS) 0.9 % injection 10-40 mL, 10-40 mL, Intracatheter, Q12H, [place], Tiffany, MD, 10 mL at 04/30/20 0847 .  sodium chloride flush (NS) 0.9 % injection 10-40 mL, 10-40 mL, Intracatheter, PRN, [place], Tiffany, MD .  sodium chloride flush (NS) 0.9 % injection 3 mL, 3 mL, Intravenous, Q12H, [place], Tiffany, MD, 3 mL at 04/30/20 0848 .  sodium chloride flush (NS) 0.9 % injection 3 mL, 3 mL, Intravenous, PRN, [place], Tiffany, MD .  traMADol (ULTRAM) tablet 50 mg, 50 mg, Oral, Q4H PRN, , Tiffany, MD, 50 mg at 04/28/20 1553   Patients Current Diet:      Diet Order                      Diet regular Room service appropriate? Yes with Assist; Fluid consistency: Thin  Diet effective now                      Precautions / Restrictions Precautions Precautions: Sternal, Fall, Cervical Precaution Comments: ACDF on 4/12, pt reports they d/c-ed collar 1 week after surgery Spinal Brace: Lumbar corset Other Brace: for comfort, pt reports "it doesn't really help me" so we didn't use back brace Restrictions Weight Bearing Restrictions: Yes Other Position/Activity Restrictions: sternal precautions    Has the patient had 2 or more falls or a fall with injury in the past year? No   Prior Activity Level Community (5-7x/wk): up until ACDF in April was independent, has been significant limited by pain in the last 4 weeks or so   Prior Functional Level Self Care: Did the patient need help bathing, dressing, using the toilet or eating? Independent   Indoor Mobility: Did the patient need assistance with walking from room to room (with or without device)? Independent   Stairs: Did the patient need assistance with internal or external stairs (with or without device)? Independent   Functional Cognition: Did the patient need help planning regular tasks such as shopping or remembering to take medications? Independent   Home Assistive Devices / Equipment Home Assistive Devices/Equipment: Cane (specify quad or straight), Walker (specify type) Home Equipment: Grab bars - tub/shower, Shower seat - built in, Cane - single point, Walker - 2 wheels   Prior Device Use: Indicate devices/aids used by the patient prior to current illness, exacerbation or injury? None of the above   Current Functional Level Cognition   Overall Cognitive Status: Within Functional Limits for tasks assessed Orientation Level: Oriented X4 General Comments: pt requiring max encouragement to participate due to pain but did participate    Extremity Assessment (includes  Sensation/Coordination)   Upper Extremity Assessment: Generalized weakness  Lower Extremity   Assessment: Defer to PT evaluation     ADLs   Overall ADL's : Needs assistance/impaired Grooming: Set up, Sitting Upper Body Bathing: Set up, Sitting Lower Body Bathing: Maximal assistance, Bed level Upper Body Dressing : Minimal assistance, Sitting Lower Body Dressing: Maximal assistance, Bed level Toilet Transfer:  (unable to complete this session due to pain) Toileting- Clothing Manipulation and Hygiene: Maximal assistance Functional mobility during ADLs: +2 for physical assistance, Moderate assistance General ADL Comments: Session focus on transfers OOB, greatly limited by pain in session     Mobility   Overal bed mobility: Needs Assistance Bed Mobility: Rolling, Sidelying to Sit, Sit to Sidelying Rolling: Min guard Sidelying to sit: Min assist Sit to supine: Mod assist Sit to sidelying: Mod assist General bed mobility comments: HOB elevated, used bed rail to roll, minA for trunk elevation wtih HOB elevated, modA for LE management back into bed     Transfers   Overall transfer level: Needs assistance Equipment used: Rolling walker (2 wheeled) Transfer via Lift Equipment: Stedy Transfers: Sit to/from Stand Sit to Stand: Mod assist, From elevated surface  Lateral/Scoot Transfers: Max assist General transfer comment: bed elevated very high and still required modA to power up and steady during transition of hands from bed to RW     Ambulation / Gait / Stairs / Wheelchair Mobility   Ambulation/Gait Ambulation/Gait assistance: Min assist, Mod assist Gait Distance (Feet): 8 Feet (x2) Assistive device: Rolling walker (2 wheeled) Gait Pattern/deviations: Step-to pattern, Decreased stride length, Trunk flexed General Gait Details: short, shuffled steps with progressive trunk flexion, cords got caught up and pt had to sit down on BSC due to pain and inability to stand while PT got cords  untangled Gait velocity: very slow Gait velocity interpretation: <1.8 ft/sec, indicate of risk for recurrent falls     Posture / Balance Dynamic Sitting Balance Sitting balance - Comments: Pt able to sit EOB without assistance for balance for short duration due to pain Balance Overall balance assessment: Needs assistance Sitting-balance support: Feet supported, Single extremity supported Sitting balance-Leahy Scale: Fair Sitting balance - Comments: Pt able to sit EOB without assistance for balance for short duration due to pain Postural control: Posterior lean Standing balance support: During functional activity, Bilateral upper extremity supported Standing balance-Leahy Scale: Poor Standing balance comment: dependent on RW     Special needs/care consideration Skin sternotomy, Diabetic management yes, Special service needs IV abx and Designated visitor wife, [name]     Previous Home Environment (from acute therapy documentation) Living Arrangements: Spouse/significant other  Lives With: Spouse Available Help at Discharge: Family, Available 24 hours/day Type of Home: House Home Layout: One level Home Access: Stairs to enter Entrance Stairs-Rails: Can reach both Entrance Stairs-Number of Steps: 7 Bathroom Shower/Tub: Walk-in shower Bathroom Toilet: Handicapped height Bathroom Accessibility: Yes How Accessible: Accessible via walker Home Care Services: No Additional Comments: Grab bars in shower with built in seat, no grab bars near toilet   Discharge Living Setting Plans for Discharge Living Setting: Patient's home Type of Home at Discharge: House Discharge Home Layout: One level Discharge Home Access: Stairs to enter Entrance Stairs-Rails: Right, Left Entrance Stairs-Number of Steps: 7 Discharge Bathroom Shower/Tub: Walk-in shower, Tub only Discharge Bathroom Toilet: Standard Discharge Bathroom Accessibility: Yes How Accessible: Accessible via walker Does the patient have  any problems obtaining your medications?: No   Social/Family/Support Systems Patient Roles: Spouse Anticipated Caregiver: Wife- intends to take FMLA Anticipated Caregiver's Contact Information: [name] [phone] Ability/Limitations of Caregiver: None reported   Caregiver Availability: 24/7 Discharge Plan Discussed with Primary Caregiver: Yes Is Caregiver In Agreement with Plan?: Yes Does Caregiver/Family have Issues with Lodging/Transportation while Pt is in Rehab?: No   Goals Patient/Family Goal for Rehab: PT/OT supervision to mod I, SLP n/a Expected length of stay: 14-18 days Pt/Family Agrees to Admission and willing to participate: Yes Program Orientation Provided & Reviewed with Pt/Caregiver Including Roles  & Responsibilities: Yes   Decrease burden of Care through IP rehab admission: n/a   Possible need for SNF placement upon discharge: Not anticipated   Patient Condition: I have reviewed medical records from [facility], spoken with CM, and patient and spouse. I met with patient at the bedside for inpatient rehabilitation assessment.  Patient will benefit from ongoing PT and OT, can actively participate in 3 hours of therapy a day 5 days of the week, and can make measurable gains during the admission.  Patient will also benefit from the coordinated team approach during an Inpatient Acute Rehabilitation admission.  The patient will receive intensive therapy as well as Rehabilitation physician, nursing, social worker, and care management interventions.  Due to safety, skin/wound care, disease management, medication administration, pain management and patient education the patient requires 24 hour a day rehabilitation nursing.  The patient is currently max to total with mobility and basic ADLs.  Discharge setting and therapy post discharge at home with home health is anticipated.  Patient has agreed to participate in the Acute Inpatient Rehabilitation Program and will admit today.    Preadmission Screen Completed By:  Caitlin E Warren, PT, DPT 04/30/2020 11:21 AM ______________________________________________________________________   Discussed status with Dr. Sherle Mello on 04/30/20  at 11:21 AM  and received approval for admission today.   Admission Coordinator:  Caitlin E Warren, PT, DPT time 11:21 AM /Date 04/30/20     Assessment/Plan: Diagnosis:  Debility after AVR, infected prosthetic valve 1. Does the need for close, 24 hr/day Medical supervision in concert with the patient's rehab needs make it unreasonable for this patient to be served in a less intensive setting? Yes 2. Co-Morbidities requiring supervision/potential complications: DM, PAF, HTN, L4-5 discitis on IV abx 3. Due to bladder management, bowel management, safety, skin/wound care, disease management, medication administration, pain management and patient education, does the patient require 24 hr/day rehab nursing? Yes 4. Does the patient require coordinated care of a physician, rehab nurse, PT, OT, and SLP to address physical and functional deficits in the context of the above medical diagnosis(es)? Yes Addressing deficits in the following areas: balance, endurance, locomotion, strength, transferring, bowel/bladder control, bathing, dressing, feeding, grooming, toileting and psychosocial support 5. Can the patient actively participate in an intensive therapy program of at least 3 hrs of therapy 5 days a week? Yes 6. The potential for patient to make measurable gains while on inpatient rehab is good 7. Anticipated functional outcomes upon discharge from inpatient rehab: supervision PT, supervision OT, n/a SLP 8. Estimated rehab length of stay to reach the above functional goals is: 14-18d 9. Anticipated discharge destination: Home 10. Overall Rehab/Functional Prognosis: good     MD Signature:      

## 2020-04-29 NOTE — Progress Notes (Addendum)
      [address]       [place] [address]             [phone]      13 Days Post-Op Procedure(s) (LRB): REDO STERNOTOMY (N/A) ASCENDING AORTIC ROOT REPLACEMENT - HOMOGRAFT using [employer] 23 MM Cryo Aortic Valve. (N/A) TRANSESOPHAGEAL ECHOCARDIOGRAM (TEE) (N/A) Subjective: Feels okay this morning. He is NPO this morning for his procedure.   Objective: Vital signs in last 24 hours: Temp:  [97.4 F (36.3 C)-98.7 F (37.1 C)] 98 F (36.7 C) (06/09 0444) Pulse Rate:  [75-103] 90 (06/09 0500) Cardiac Rhythm: Atrial fibrillation (06/09 0444) Resp:  [11-19] 18 (06/09 0500) BP: (96-113)/(61-79) 100/64 (06/09 0444) SpO2:  [97 %-99 %] 97 % (06/09 0500) Weight:  [80.6 kg] 80.6 kg (06/09 0500)     Intake/Output from previous day: 06/08 0701 - 06/09 0700 In: 1919.7 [P.O.:900; I.V.:419.7; IV Piggyback:600] Out: 3750 [Urine:3750] Intake/Output this shift: No intake/output data recorded.  General appearance: alert, cooperative and no distress Heart: irregularly irregular Lungs: clear to auscultation bilaterally Abdomen: soft, non-tender; bowel sounds normal; no masses,  no organomegaly Extremities: extremities normal, atraumatic, no cyanosis or edema Wound: clean and dry  Lab Results: Recent Labs    04/29/20 0500  WBC 7.8  HGB 8.3*  HCT 27.1*  PLT 328   BMET:  Recent Labs    04/29/20 0500  NA 137  K 3.7  CL 102  CO2 25  GLUCOSE 121*  BUN 16  CREATININE 1.28*  CALCIUM 8.7*    PT/INR: No results for input(s): LABPROT, INR in the last 72 hours. ABG    Component Value Date/Time   PHART 7.409 04/16/2020 2343   HCO3 25.0 04/16/2020 2343   TCO2 26 04/16/2020 2343   O2SAT 96.0 04/16/2020 2343   CBG (last 3)  Recent Labs    04/28/20 1631 04/28/20 2119 04/29/20 0602  GLUCAP 173* 108* 124*    Assessment/Plan: S/P Procedure(s) (LRB): REDO STERNOTOMY (N/A) ASCENDING AORTIC ROOT REPLACEMENT - HOMOGRAFT using [employer] 23 MM Cryo  Aortic Valve. (N/A) TRANSESOPHAGEAL ECHOCARDIOGRAM (TEE) (N/A)  1. CV-previous afib a few days ago, rate-controlled afib.  Amio bolus and drip yesterday, continue BB.  BP well controlled 2. Pulm- tolerating room air with good oxygen saturation 3. Renal-CXR with stable with small pleural effusions.  4. H and h 8.3/27.1, expected acute blood loss anemia 5. Endo-blood glucose well controlled.  6. ID-on Ampicillin and Ceftriaxone for disseminated Enterococcus Faecalis 7.Spinal osteo/discitis with small psoas abscesses and probable myositis in paraspinous muscles related to enterococcal bacteremia  Plan: Cardioversion this morning. Spoke with CIR yesterday. Will plan for transfer tomorrow if patient remains stable. Continue to work with PT.    LOS: 16 days    Johnathan Murray 04/29/2020   Chart reviewed, patient examined, agree with above. Doing well overall. He has had a long history of atrial fib in past and was on amiodarone before but says he had gone for a couple years without atrial fib until he had myelogram in 12/2019 and developed AF during the procedure. Hopefully will hold sinus after DCCV but will likely need to continue higher dose amio for a while.

## 2020-04-29 NOTE — Progress Notes (Addendum)
Progress Note  Patient Name: Johnathan Murray Date of Encounter: 04/29/2020  [facility] Cardiologist: Jenean Lindau, MD   Subjective   Plan for TEE/DCCV today. Afib with rates in the 90s. Some borderline pressures. Patient did not sleep well, feels very tired. No chest pain but can feel the afib.  Inpatient Medications    Scheduled Meds: . apixaban  5 mg Oral BID  . vitamin C  500 mg Oral Daily  . aspirin EC  81 mg Oral Daily  . bisacodyl  10 mg Oral Daily   Or  . bisacodyl  10 mg Rectal Daily  . Chlorhexidine Gluconate Cloth  6 each Topical Daily  . diphenhydrAMINE  25 mg Oral QHS  . docusate sodium  200 mg Oral Daily  . feeding supplement (ENSURE ENLIVE)  237 mL Oral TID BM  . ferrous [id number] C-folic acid  1 capsule Oral BID PC  . insulin aspart  0-24 Units Subcutaneous TID WC  . insulin detemir  8 Units Subcutaneous Daily  . levothyroxine  25 mcg Oral QAC breakfast  . metoprolol tartrate  25 mg Oral BID  . oxyCODONE  10 mg Oral Q12H  . pantoprazole  40 mg Oral Daily  . polyethylene glycol  17 g Oral Daily  . pregabalin  100 mg Oral TID  . sodium chloride flush  10-40 mL Intracatheter Q12H  . sodium chloride flush  3 mL Intravenous Q12H   Continuous Infusions: . sodium chloride Stopped (04/18/20 1418)  . sodium chloride    . sodium chloride 500 mL (04/28/20 2346)  . amiodarone 30 mg/hr (04/29/20 0148)  . ampicillin IVPB 2 gram/NS 100 mL (Mini-Bag Plus) 2 g (04/29/20 0848)  . cefTRIAXone (ROCEPHIN) IVPB 2 gram/100 mL NS (Mini-Bag Plus) 2 g (04/29/20 0011)  . lactated ringers Stopped (04/16/20 2018)   PRN Meds: sodium chloride, sodium chloride, cyclobenzaprine, dextrose, morphine injection, ondansetron (ZOFRAN) IV, sodium chloride flush, sodium chloride flush, traMADol   Vital Signs    Vitals:   04/29/20 0146 04/29/20 0444 04/29/20 0500 04/29/20 0838  BP: 110/79 100/64  109/67  Pulse: 93 95 90 98  Resp: 14 13 18    Temp: 98 F (36.7 C)  98 F (36.7 C)  98.3 F (36.8 C)  TempSrc: Oral Oral  Oral  SpO2: 98% 98% 97% 100%  Weight:   80.6 kg   Height:        Intake/Output Summary (Last 24 hours) at 04/29/2020 0859 Last data filed at 04/29/2020 0848 Gross per 24 hour  Intake 1919.71 ml  Output 3750 ml  Net -1830.29 ml   Last 3 Weights 04/29/2020 04/28/2020 04/27/2020  Weight (lbs) 177 lb 11.1 oz 179 lb 3.7 oz 175 lb 0.7 oz  Weight (kg) 80.6 kg 81.3 kg 79.4 kg      Telemetry    Afib, HR 90s, PVCs - Personally Reviewed  ECG    Afib, 103 bpm - Personally Reviewed  Physical Exam   GEN: No acute distress.   Neck: No JVD Cardiac: Irreg Irreg, no murmurs, rubs, or gallops.  Respiratory: Clear to auscultation bilaterally. GI: Soft, nontender, non-distended  MS: No edema; No deformity. Neuro:  Nonfocal  Psych: Normal affect   Labs    High Sensitivity Troponin:  No results for input(s): TROPONINIHS in the last 720 hours.    Chemistry Recent Labs  Lab 04/24/20 0524 04/26/20 0315 04/29/20 0500  NA 138 137 137  K 3.9 4.0 3.7  CL 102 99 102  CO2 27 26 25   GLUCOSE 109* 176* 121*  BUN 17 19 16   CREATININE 1.35* 1.39* 1.28*  CALCIUM 8.6* 8.9 8.7*  GFRNONAA 54* 52* 58*  GFRAA >60 >60 >60  ANIONGAP 9 12 10      Hematology Recent Labs  Lab 04/26/20 0315 04/29/20 0500  WBC 9.4 7.8  RBC 2.92* 2.84*  HGB 8.6* 8.3*  HCT 28.0* 27.1*  MCV 95.9 95.4  MCH 29.5 29.2  MCHC 30.7 30.6  RDW 15.4 14.9  PLT 321 328    BNPNo results for input(s): BNP, PROBNP in the last 168 hours.   DDimer No results for input(s): DDIMER in the last 168 hours.   Radiology    No results found.  Cardiac Studies   TEE 04/13/2020  1. There is a small vegetation attached to the leaflet of the [place] that  measures 0.8 cm x 0.5 cm. There is a pseudoaneurysm located in the  anterior aspect of the aortic root, which is actually best seen on the  supplemental TTE images. There is no  fistulous connection with the LVOT and no apparent  regurgitation. The  color flow appears to be in and out of the pseudoaneurysm. 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 A cardiac CT is  recommended to better characterize the location and extent of the  pseudoaneurysm. The aortic valve has been repaired/replaced. Aortic valve  regurgitation is not visualized. Aortic valve mean gradient measures 13.0  mmHg. Aortic valve Vmax measures 2.32  m/s.  2. Left ventricular ejection fraction, by estimation, is 50 to 55%. The  left ventricle has low normal function. The left ventricle has no regional  wall motion abnormalities.  3. Right ventricular systolic function is normal. The right ventricular  size is normal.  4. No left atrial/left atrial appendage thrombus was detected. The LAA  emptying velocity was 56 cm/s.  5. The mitral valve is grossly normal. Mild mitral valve regurgitation.  No evidence of mitral stenosis.  6. There is a pseudoaneurysm of the aortic root anterior to the RCC with  clear color flow into the affected area. Aortic root abscess present.  There is mild (Grade II) layered plaque involving the descending aorta.   Cardiac cath 01/21/20  Aortic bioprosthesis was not crossed and therefore hemodynamics in the LV were not recorded.  Patent LIMA to the LAD.  Occluded saphenous vein graft to the marginal.  Occluded saphenous vein graft to the diagonal.  Total occlusion of the mid to proximal LAD.  Patent circumflex with 50% proximal stenosis and segmental diffuse disease extending into the large obtuse marginal.  RCA is dominant.  There is diffuse irregularity noted throughout the proximal to distal vessel with up to 30% in the mid vessel.  RECOMMENDATIONS:   Continue current therapy.  No change in anatomy is noted compared to the prior angiogram performed in 2019.  The abnormal nuclear study with anterolateral ischemia will be a chronic finding and  in absence of symptoms does not warrant repeat invasive investigation.  Patient Profile     69 y.o. male with history of CAD s/p CABG, AVR, pAF, DM who was admitted with sepsis and found to have aortic root abscess. He is s/p aortic valve replacement and aortic repair (23 mm homograft root replacement with coronary reimplant 04/16/2020. He was cardioverted initially for Aflutter 04/13/2020 and now developed Afib 04/27/2020.   Assessment & Plan    Afib with RVR - History of afib  s/p DCCV 04/13/20 now with postop afib  - started on IV amio drip plan to transition to oral - Eliquis 5 mg BID - plan for TEE/DCCV today  For questions or updates, please contact [place] Please consult [email or web address] for contact info under        Signed, Andrw Mcguirt Ninfa Meeker, PA-C  04/29/2020, 8:59 AM

## 2020-04-29 NOTE — Anesthesia Postprocedure Evaluation (Signed)
Anesthesia Post Note  Patient: Kaniel Kiang  Procedure(s) Performed: TRANSESOPHAGEAL ECHOCARDIOGRAM (TEE) (N/A ) CARDIOVERSION (N/A )     Patient location during evaluation: Endoscopy Anesthesia Type: General Level of consciousness: awake and alert Pain management: pain level controlled Vital Signs Assessment: post-procedure vital signs reviewed and stable Respiratory status: spontaneous breathing, nonlabored ventilation, respiratory function stable and patient connected to nasal cannula oxygen Cardiovascular status: blood pressure returned to baseline and stable Postop Assessment: no apparent nausea or vomiting Anesthetic complications: no    Last Vitals:  Vitals:   04/29/20 1240 04/29/20 1245  BP: (!) 114/41 (!) 116/43  Pulse: (!) 52 (!) 53  Resp: 14 14  Temp:    SpO2: 100% 100%    Last Pain:  Vitals:   04/29/20 1240  TempSrc:   PainSc: 0-No pain                 Asaiah Scarber,W. EDMOND

## 2020-04-29 NOTE — Progress Notes (Addendum)
Plan of care reviewed. Pt's alert and oriented x 4. MEW score yellow due to soft BP.Metoprolol was held tonight, Atrial fibrillation on monitor, HR 70s-90s, BP 98/ 66 - 110-79 mmHg. Continuing Amiodarone 30 mg/hr IV. SPO2 98% on room air, RR 14-18, Temp 98.0 F.   His sternal and right clavicle incision dry and clean, no drainage. Pain tolerated well. Oxycodone given per schedule. NPO aftermidnight for TEE at am. No immediate distress tonight. We will continue to monitor.  Kennyth Lose, RN

## 2020-04-29 NOTE — CV Procedure (Signed)
Brief TEE Note  LVEF >55% No LA/LAA thrombus or masses Mild-moderate MR with multiple jets Mild intravalvular AR and mild TR Trivial PR Bioprosthetic aortic valve well-seated without any perivalvular regurgitation. No ASD or PFO by color flow Doppler.   For additional detail see full report.   Electrical Cardioversion Procedure Note Johnathan Murray [id number] [birth year]  Procedure: Electrical Cardioversion Indications:  Atrial Fibrillation  Procedure Details Consent: Risks of procedure as well as the alternatives and risks of each were explained to the (patient/caregiver).  Consent for procedure obtained. Time Out: Verified patient identification, verified procedure, site/side was marked, verified correct patient position, special equipment/implants available, medications/allergies/relevent history reviewed, required imaging and test results available.  Performed  Patient placed on cardiac monitor, pulse oximetry, supplemental oxygen as necessary.  Sedation given: propofol Pacer pads placed anterior and posterior chest.  Cardioverted 1 time(s).  Cardioverted at [place].  Evaluation Findings: Post procedure EKG shows: sinus rhythm Complications: None Patient did tolerate procedure well.   Johnathan Latch, MD 04/29/2020, 12:23 PM

## 2020-04-29 NOTE — Transfer of Care (Signed)
Immediate Anesthesia Transfer of Care Note  Patient: Johnathan Murray  Procedure(s) Performed: TRANSESOPHAGEAL ECHOCARDIOGRAM (TEE) (N/A ) CARDIOVERSION (N/A )  Patient Location: Endoscopy Unit  Anesthesia Type:MAC  Level of Consciousness: awake  Airway & Oxygen Therapy: Patient Spontanous Breathing  Post-op Assessment: Report given to RN and Post -op Vital signs reviewed and stable  Post vital signs: Reviewed and stable  Last Vitals:  Vitals Value Taken Time  BP 104/34 04/29/20 1228  Temp 36.7 C 04/29/20 1224  Pulse 52 04/29/20 1228  Resp 13 04/29/20 1228  SpO2 100 % 04/29/20 1228  Vitals shown include unvalidated device data.  Last Pain:  Vitals:   04/29/20 1224  TempSrc: Oral  PainSc: Asleep      Patients Stated Pain Goal: 3 (16/83/72 9021)  Complications: No apparent anesthesia complications

## 2020-04-29 NOTE — Interval H&P Note (Signed)
History and Physical Interval Note:  04/29/2020 11:53 AM  Johnathan Murray  has presented today for surgery, with the diagnosis of A-FIB.  The various methods of treatment have been discussed with the patient and family. After consideration of risks, benefits and other options for treatment, the patient has consented to  Procedure(s): TRANSESOPHAGEAL ECHOCARDIOGRAM (TEE) (N/A) CARDIOVERSION (N/A) as a surgical intervention.  The patient's history has been reviewed, patient examined, no change in status, stable for surgery.  I have reviewed the patient's chart and labs.  Questions were answered to the patient's satisfaction.     Skeet Latch, MD

## 2020-04-29 NOTE — Anesthesia Preprocedure Evaluation (Addendum)
Anesthesia Evaluation  Patient identified by MRN, date of birth, ID band Patient awake    Reviewed: Allergy & Precautions, H&P , NPO status , Patient's Chart, lab work & pertinent test results  Airway Mallampati: III  TM Distance: >3 FB Neck ROM: Full    Dental no notable dental hx. (+) Teeth Intact, Dental Advisory Given   Pulmonary neg pulmonary ROS,    Pulmonary exam normal breath sounds clear to auscultation       Cardiovascular hypertension, Pt. on medications + dysrhythmias Atrial Fibrillation + Valvular Problems/Murmurs  Rhythm:Irregular Rate:Normal     Neuro/Psych negative neurological ROS  negative psych ROS   GI/Hepatic Neg liver ROS, GERD  ,  Endo/Other  diabetes, Insulin DependentHypothyroidism   Renal/GU Renal InsufficiencyRenal disease  negative genitourinary   Musculoskeletal  (+) Arthritis , Osteoarthritis,    Abdominal   Peds  Hematology  (+) Blood dyscrasia, anemia ,   Anesthesia Other Findings   Reproductive/Obstetrics negative OB ROS                            Anesthesia Physical Anesthesia Plan  ASA: III  Anesthesia Plan: General   Post-op Pain Management:    Induction: Intravenous  PONV Risk Score and Plan: 2 and Propofol infusion and Treatment may vary due to age or medical condition  Airway Management Planned: Nasal Cannula  Additional Equipment:   Intra-op Plan:   Post-operative Plan:   Informed Consent: I have reviewed the patients History and Physical, chart, labs and discussed the procedure including the risks, benefits and alternatives for the proposed anesthesia with the patient or authorized representative who has indicated his/her understanding and acceptance.     Dental advisory given  Plan Discussed with: CRNA  Anesthesia Plan Comments:         Anesthesia Quick Evaluation

## 2020-04-29 NOTE — Progress Notes (Signed)
Physical Therapy Treatment Patient Details Name: Johnathan Murray MRN: [id number] DOB: 1951/09/11 Today's Date: 04/29/2020    History of Present Illness Pt transferred from [place] on 5/24 after he developed afib with RVR and found to have new aortic root abscess with vegetations and prosthetic aortic valve endocarditis. Underwent  aortic root replacement on 04/16/20. Pt originally admitted 4/30 with acute discitis L4-5 and bil septic knees. Transferred to CIR on 5/18. PMH - ACDF on 4/12, back surg 05/2019, CAD, CABG, DM, HTN.. Pt underwent cardioversion on 6/9.    PT Comments    Pt received morphine about 40 min prior PT session. Pt continues to be in 10/10 back pain with mobility greatly limiting mobility and ambulation tolerance. Pt did amb 8'x2 with RW today with minA x1. Pt was leaving for cardioversion therefore unable to complete exercises. Con't to recommend CIR Upon d/c for maximal functional recovery. Acute PT to cont to follow.   Follow Up Recommendations  CIR     Equipment Recommendations  (TBD at next venue)    Recommendations for Other Services       Precautions / Restrictions Precautions Precautions: Sternal;Fall;Cervical Precaution Comments: ACDF on 4/12, pt reports they d/c-ed collar 1 week after surgery Required Braces or Orthoses: Spinal Brace Spinal Brace: Lumbar corset Other Brace: for comfort, pt reports "it doesn't really help me" so we didn't use back brace Restrictions Weight Bearing Restrictions: Yes Other Position/Activity Restrictions: sternal precautions    Mobility  Bed Mobility Overal bed mobility: Needs Assistance Bed Mobility: Rolling;Sidelying to Sit;Sit to Sidelying Rolling: Min guard Sidelying to sit: Min assist   Sit to supine: Mod assist   General bed mobility comments: HOB elevated, used bed rail to roll, minA for trunk elevation wtih HOB elevated, modA for LE management back into bed  Transfers Overall transfer level: Needs assistance  Equipment used: Rolling walker (2 wheeled) Transfers: Sit to/from Stand Sit to Stand: Mod assist;From elevated surface         General transfer comment: bed elevated very high and still required modA to power up and steady during transition of hands from bed to RW  Ambulation/Gait Ambulation/Gait assistance: Min assist;Mod assist Gait Distance (Feet): 8 Feet(x2) Assistive device: Rolling walker (2 wheeled) Gait Pattern/deviations: Step-to pattern;Decreased stride length;Trunk flexed Gait velocity: very slow Gait velocity interpretation: <1.8 ft/sec, indicate of risk for recurrent falls General Gait Details: short, shuffled steps with progressive trunk flexion, cords got caught up and pt had to sit down on BSC due to pain and inability to stand while PT got cords untangled   Stairs             Wheelchair Mobility    Modified Rankin (Stroke Patients Only)       Balance Overall balance assessment: Needs assistance Sitting-balance support: Feet supported;Single extremity supported Sitting balance-Leahy Scale: Fair     Standing balance support: During functional activity;Bilateral upper extremity supported Standing balance-Leahy Scale: Poor Standing balance comment: dependent on RW                            Cognition Arousal/Alertness: Awake/alert Behavior During Therapy: WFL for tasks assessed/performed Overall Cognitive Status: Within Functional Limits for tasks assessed                                 General Comments: pt requiring max encouragement to participate due to pain but  did participate      Exercises      General Comments General comments (skin integrity, edema, etc.): VSS, HR mildly elevated, RN aware as pt going for cardioveresion      Pertinent Vitals/Pain Pain Assessment: Faces Faces Pain Scale: Hurts worst Pain Location: low back with amb Pain Descriptors / Indicators:  Grimacing;Guarding;Moaning;Sore;Discomfort;Crushing Pain Intervention(s): Limited activity within patient's tolerance    Home Living                      Prior Function            PT Goals (current goals can now be found in the care plan section) Progress towards PT goals: Progressing toward goals    Frequency    Min 3X/week      PT Plan Current plan remains appropriate    Co-evaluation              AM-PAC PT "6 Clicks" Mobility   Outcome Measure  Help needed turning from your back to your side while in a flat bed without using bedrails?: A Little Help needed moving from lying on your back to sitting on the side of a flat bed without using bedrails?: A Little Help needed moving to and from a bed to a chair (including a wheelchair)?: A Lot Help needed standing up from a chair using your arms (e.g., wheelchair or bedside chair)?: A Lot Help needed to walk in hospital room?: A Lot Help needed climbing 3-5 steps with a railing? : Total 6 Click Score: 13    End of Session Equipment Utilized During Treatment: Gait belt Activity Tolerance: Patient limited by pain Patient left: in bed;with call bell/phone within reach;with family/visitor present(transportation present to take pt for cardioversion) Nurse Communication: Mobility status PT Visit Diagnosis: Other abnormalities of gait and mobility (R26.89);Muscle weakness (generalized) (M62.81);Pain Pain - part of body: (back)     Time: 1050-1106 PT Time Calculation (min) (ACUTE ONLY): 16 min  Charges:  $Gait Training: 8-22 mins                     Kittie Plater, PT, DPT Acute Rehabilitation Services Pager #: [phone] Office #: [phone]    Berline Lopes 04/29/2020, 12:40 PM

## 2020-04-29 NOTE — Progress Notes (Signed)
  Echocardiogram Echocardiogram Transesophageal has been performed.  Johnathan Murray 04/29/2020, 1:13 PM

## 2020-04-30 ENCOUNTER — Encounter (HOSPITAL_COMMUNITY): Payer: Self-pay | Admitting: Cardiovascular Disease

## 2020-04-30 ENCOUNTER — Inpatient Hospital Stay (HOSPITAL_COMMUNITY): Payer: BC Managed Care – PPO

## 2020-04-30 ENCOUNTER — Inpatient Hospital Stay (HOSPITAL_COMMUNITY)
Admission: RE | Admit: 2020-04-30 | Discharge: 2020-05-02 | DRG: 485 | Disposition: A | Discharge status: Other Acute Inpatient Hospital | Payer: BC Managed Care – PPO | Source: Intra-hospital | Attending: Physical Medicine and Rehabilitation | Admitting: Physical Medicine and Rehabilitation

## 2020-04-30 DIAGNOSIS — I330 Acute and subacute infective endocarditis: Secondary | ICD-10-CM | POA: Diagnosis not present

## 2020-04-30 DIAGNOSIS — M65861 Other synovitis and tenosynovitis, right lower leg: Secondary | ICD-10-CM | POA: Diagnosis present

## 2020-04-30 DIAGNOSIS — M4626 Osteomyelitis of vertebra, lumbar region: Secondary | ICD-10-CM | POA: Diagnosis not present

## 2020-04-30 DIAGNOSIS — M65862 Other synovitis and tenosynovitis, left lower leg: Secondary | ICD-10-CM | POA: Diagnosis present

## 2020-04-30 DIAGNOSIS — I480 Paroxysmal atrial fibrillation: Secondary | ICD-10-CM | POA: Diagnosis present

## 2020-04-30 DIAGNOSIS — Z951 Presence of aortocoronary bypass graft: Secondary | ICD-10-CM

## 2020-04-30 DIAGNOSIS — M4646 Discitis, unspecified, lumbar region: Secondary | ICD-10-CM | POA: Diagnosis present

## 2020-04-30 DIAGNOSIS — D631 Anemia in chronic kidney disease: Secondary | ICD-10-CM | POA: Diagnosis present

## 2020-04-30 DIAGNOSIS — K5900 Constipation, unspecified: Secondary | ICD-10-CM | POA: Diagnosis present

## 2020-04-30 DIAGNOSIS — T826XXD Infection and inflammatory reaction due to cardiac valve prosthesis, subsequent encounter: Secondary | ICD-10-CM

## 2020-04-30 DIAGNOSIS — Z981 Arthrodesis status: Secondary | ICD-10-CM

## 2020-04-30 DIAGNOSIS — I2510 Atherosclerotic heart disease of native coronary artery without angina pectoris: Secondary | ICD-10-CM | POA: Diagnosis present

## 2020-04-30 DIAGNOSIS — Z8042 Family history of malignant neoplasm of prostate: Secondary | ICD-10-CM

## 2020-04-30 DIAGNOSIS — Z7901 Long term (current) use of anticoagulants: Secondary | ICD-10-CM

## 2020-04-30 DIAGNOSIS — M009 Pyogenic arthritis, unspecified: Secondary | ICD-10-CM

## 2020-04-30 DIAGNOSIS — D62 Acute posthemorrhagic anemia: Secondary | ICD-10-CM | POA: Diagnosis not present

## 2020-04-30 DIAGNOSIS — M00869 Arthritis due to other bacteria, unspecified knee: Secondary | ICD-10-CM | POA: Diagnosis not present

## 2020-04-30 DIAGNOSIS — M4647 Discitis, unspecified, lumbosacral region: Secondary | ICD-10-CM | POA: Diagnosis not present

## 2020-04-30 DIAGNOSIS — I129 Hypertensive chronic kidney disease with stage 1 through stage 4 chronic kidney disease, or unspecified chronic kidney disease: Secondary | ICD-10-CM | POA: Diagnosis present

## 2020-04-30 DIAGNOSIS — N183 Chronic kidney disease, stage 3 unspecified: Secondary | ICD-10-CM | POA: Diagnosis present

## 2020-04-30 DIAGNOSIS — M47896 Other spondylosis, lumbar region: Secondary | ICD-10-CM | POA: Diagnosis present

## 2020-04-30 DIAGNOSIS — Z888 Allergy status to other drugs, medicaments and biological substances status: Secondary | ICD-10-CM

## 2020-04-30 DIAGNOSIS — N179 Acute kidney failure, unspecified: Secondary | ICD-10-CM | POA: Diagnosis present

## 2020-04-30 DIAGNOSIS — Z953 Presence of xenogenic heart valve: Secondary | ICD-10-CM

## 2020-04-30 DIAGNOSIS — R7881 Bacteremia: Secondary | ICD-10-CM | POA: Diagnosis present

## 2020-04-30 DIAGNOSIS — Z79899 Other long term (current) drug therapy: Secondary | ICD-10-CM

## 2020-04-30 DIAGNOSIS — Y831 Surgical operation with implant of artificial internal device as the cause of abnormal reaction of the patient, or of later complication, without mention of misadventure at the time of the procedure: Secondary | ICD-10-CM | POA: Diagnosis present

## 2020-04-30 DIAGNOSIS — E1151 Type 2 diabetes mellitus with diabetic peripheral angiopathy without gangrene: Secondary | ICD-10-CM | POA: Diagnosis present

## 2020-04-30 DIAGNOSIS — E1169 Type 2 diabetes mellitus with other specified complication: Secondary | ICD-10-CM | POA: Diagnosis present

## 2020-04-30 DIAGNOSIS — M11262 Other chondrocalcinosis, left knee: Secondary | ICD-10-CM | POA: Diagnosis present

## 2020-04-30 DIAGNOSIS — M660 Rupture of popliteal cyst: Secondary | ICD-10-CM | POA: Diagnosis present

## 2020-04-30 DIAGNOSIS — Z8249 Family history of ischemic heart disease and other diseases of the circulatory system: Secondary | ICD-10-CM

## 2020-04-30 DIAGNOSIS — E1122 Type 2 diabetes mellitus with diabetic chronic kidney disease: Secondary | ICD-10-CM | POA: Diagnosis present

## 2020-04-30 DIAGNOSIS — E43 Unspecified severe protein-calorie malnutrition: Secondary | ICD-10-CM | POA: Diagnosis not present

## 2020-04-30 DIAGNOSIS — Z794 Long term (current) use of insulin: Secondary | ICD-10-CM | POA: Diagnosis not present

## 2020-04-30 DIAGNOSIS — M00862 Arthritis due to other bacteria, left knee: Principal | ICD-10-CM | POA: Diagnosis present

## 2020-04-30 DIAGNOSIS — B952 Enterococcus as the cause of diseases classified elsewhere: Secondary | ICD-10-CM | POA: Diagnosis present

## 2020-04-30 DIAGNOSIS — K219 Gastro-esophageal reflux disease without esophagitis: Secondary | ICD-10-CM | POA: Diagnosis not present

## 2020-04-30 DIAGNOSIS — Z6824 Body mass index (BMI) 24.0-24.9, adult: Secondary | ICD-10-CM

## 2020-04-30 DIAGNOSIS — M4640 Discitis, unspecified, site unspecified: Secondary | ICD-10-CM | POA: Diagnosis present

## 2020-04-30 DIAGNOSIS — E039 Hypothyroidism, unspecified: Secondary | ICD-10-CM | POA: Diagnosis present

## 2020-04-30 DIAGNOSIS — M00861 Arthritis due to other bacteria, right knee: Secondary | ICD-10-CM | POA: Diagnosis not present

## 2020-04-30 DIAGNOSIS — E785 Hyperlipidemia, unspecified: Secondary | ICD-10-CM | POA: Diagnosis present

## 2020-04-30 DIAGNOSIS — H9012 Conductive hearing loss, unilateral, left ear, with unrestricted hearing on the contralateral side: Secondary | ICD-10-CM | POA: Diagnosis present

## 2020-04-30 DIAGNOSIS — Z808 Family history of malignant neoplasm of other organs or systems: Secondary | ICD-10-CM

## 2020-04-30 LAB — SYNOVIAL CELL COUNT + DIFF, W/ CRYSTALS
Eosinophils-Synovial: 0 % (ref 0–1)
Lymphocytes-Synovial Fld: 1 % (ref 0–20)
Monocyte-Macrophage-Synovial Fluid: 3 % — ABNORMAL LOW (ref 50–90)
Neutrophil, Synovial: 96 % — ABNORMAL HIGH (ref 0–25)
WBC, Synovial: 114500 /mm3 — ABNORMAL HIGH (ref 0–200)

## 2020-04-30 LAB — GLUCOSE, CAPILLARY
Glucose-Capillary: 142 mg/dL — ABNORMAL HIGH (ref 70–99)
Glucose-Capillary: 135 mg/dL — ABNORMAL HIGH (ref 70–99)
Glucose-Capillary: 182 mg/dL — ABNORMAL HIGH (ref 70–99)
Glucose-Capillary: 77 mg/dL (ref 70–99)

## 2020-04-30 MED ORDER — ASPIRIN EC 81 MG PO TBEC
81.0000 mg | DELAYED_RELEASE_TABLET | Freq: Every day | ORAL | Status: DC
  Administered 2020-05-01 – 2020-05-02 (×2): 81 mg via ORAL
  Filled 2020-04-30 (×2): qty 1

## 2020-04-30 MED ORDER — OXYCODONE HCL 5 MG PO TABS
5.0000 mg | ORAL_TABLET | ORAL | Status: DC | PRN
  Administered 2020-05-01 – 2020-05-02 (×2): 5 mg via ORAL
  Filled 2020-04-30 (×2): qty 1

## 2020-04-30 MED ORDER — AMIODARONE HCL 400 MG PO TABS: 400.0000 mg | ORAL_TABLET | Freq: Two times a day (BID) | ORAL | Status: DC

## 2020-04-30 MED ORDER — TRAMADOL HCL 50 MG PO TABS
50.0000 mg | ORAL_TABLET | ORAL | Status: DC | PRN
  Administered 2020-05-01: 50 mg via ORAL
  Filled 2020-04-30: qty 1

## 2020-04-30 MED ORDER — PANTOPRAZOLE SODIUM 40 MG PO TBEC
40.0000 mg | DELAYED_RELEASE_TABLET | Freq: Every day | ORAL | Status: DC
  Administered 2020-05-01 – 2020-05-02 (×2): 40 mg via ORAL
  Filled 2020-04-30 (×2): qty 1

## 2020-04-30 MED ORDER — OXYCODONE HCL ER 10 MG PO T12A
10.0000 mg | EXTENDED_RELEASE_TABLET | Freq: Two times a day (BID) | ORAL | Status: DC
  Administered 2020-04-30 – 2020-05-01 (×3): 10 mg via ORAL
  Filled 2020-04-30 (×3): qty 1

## 2020-04-30 MED ORDER — BISACODYL 10 MG RE SUPP
10.0000 mg | Freq: Every day | RECTAL | Status: DC
  Filled 2020-04-30: qty 1

## 2020-04-30 MED ORDER — BISACODYL 5 MG PO TBEC
10.0000 mg | DELAYED_RELEASE_TABLET | Freq: Every day | ORAL | Status: DC
  Administered 2020-05-01 – 2020-05-02 (×2): 10 mg via ORAL
  Filled 2020-04-30 (×2): qty 2

## 2020-04-30 MED ORDER — CYCLOBENZAPRINE HCL 5 MG PO TABS
5.0000 mg | ORAL_TABLET | Freq: Three times a day (TID) | ORAL | Status: DC | PRN
  Administered 2020-05-01: 5 mg via ORAL
  Filled 2020-04-30: qty 1

## 2020-04-30 MED ORDER — ASCORBIC ACID 500 MG PO TABS
500.0000 mg | ORAL_TABLET | Freq: Every day | ORAL | Status: DC
  Administered 2020-05-01 – 2020-05-02 (×2): 500 mg via ORAL
  Filled 2020-04-30 (×2): qty 1

## 2020-04-30 MED ORDER — INSULIN ASPART 100 UNIT/ML ~~LOC~~ SOLN
0.0000 [IU] | Freq: Three times a day (TID) | SUBCUTANEOUS | Status: DC
  Administered 2020-04-30: 4 [IU] via SUBCUTANEOUS
  Administered 2020-05-01 (×2): 2 [IU] via SUBCUTANEOUS
  Administered 2020-05-02 (×2): 8 [IU] via SUBCUTANEOUS

## 2020-04-30 MED ORDER — DOCUSATE SODIUM 100 MG PO CAPS
200.0000 mg | ORAL_CAPSULE | Freq: Every day | ORAL | Status: DC
  Administered 2020-05-01: 200 mg via ORAL
  Filled 2020-04-30: qty 2

## 2020-04-30 MED ORDER — SODIUM CHLORIDE 0.9 % IV SOLN
2.0000 g | INTRAVENOUS | Status: DC
  Administered 2020-04-30 – 2020-05-02 (×12): 2 g via INTRAVENOUS
  Filled 2020-04-30: qty 2
  Filled 2020-04-30: qty 2000
  Filled 2020-04-30 (×3): qty 2
  Filled 2020-04-30 (×2): qty 2000
  Filled 2020-04-30: qty 2
  Filled 2020-04-30: qty 2000
  Filled 2020-04-30 (×2): qty 2
  Filled 2020-04-30: qty 2000
  Filled 2020-04-30: qty 2
  Filled 2020-04-30: qty 2000
  Filled 2020-04-30: qty 2
  Filled 2020-04-30: qty 2000
  Filled 2020-04-30: qty 2

## 2020-04-30 MED ORDER — PREGABALIN 50 MG PO CAPS
100.0000 mg | ORAL_CAPSULE | Freq: Three times a day (TID) | ORAL | Status: DC
  Administered 2020-04-30 – 2020-05-02 (×6): 100 mg via ORAL
  Filled 2020-04-30 (×6): qty 2

## 2020-04-30 MED ORDER — AMIODARONE HCL 200 MG PO TABS: 200.0000 mg | ORAL_TABLET | Freq: Every day | ORAL | Status: DC

## 2020-04-30 MED ORDER — SORBITOL 70 % SOLN: 30.0000 mL | Freq: Every day | Status: DC | PRN

## 2020-04-30 MED ORDER — FE FUMARATE-B12-VIT C-FA-IFC PO CAPS
1.0000 | ORAL_CAPSULE | Freq: Two times a day (BID) | ORAL | Status: DC
  Administered 2020-04-30 – 2020-05-01 (×3): 1 via ORAL
  Filled 2020-04-30 (×5): qty 1

## 2020-04-30 MED ORDER — LEVOTHYROXINE SODIUM 25 MCG PO TABS
25.0000 ug | ORAL_TABLET | Freq: Every day | ORAL | Status: DC
  Administered 2020-05-01 – 2020-05-02 (×2): 25 ug via ORAL
  Filled 2020-04-30 (×2): qty 1

## 2020-04-30 MED ORDER — BUPIVACAINE HCL (PF) 0.5 % IJ SOLN
10.0000 mL | Freq: Once | INTRAMUSCULAR | Status: DC
  Filled 2020-04-30: qty 10

## 2020-04-30 MED ORDER — AMIODARONE HCL 200 MG PO TABS
400.0000 mg | ORAL_TABLET | Freq: Two times a day (BID) | ORAL | Status: DC
  Administered 2020-04-30: 400 mg via ORAL
  Filled 2020-04-30: qty 2

## 2020-04-30 MED ORDER — ASPIRIN 81 MG PO TBEC: 81.0000 mg | DELAYED_RELEASE_TABLET | Freq: Every day | ORAL | Status: DC

## 2020-04-30 MED ORDER — SODIUM CHLORIDE 0.9 % IV SOLN
2.0000 g | Freq: Two times a day (BID) | INTRAVENOUS | Status: DC
  Administered 2020-04-30 – 2020-05-02 (×5): 2 g via INTRAVENOUS
  Filled 2020-04-30 (×5): qty 2

## 2020-04-30 MED ORDER — INSULIN DETEMIR 100 UNIT/ML ~~LOC~~ SOLN
8.0000 [IU] | Freq: Every day | SUBCUTANEOUS | Status: DC
  Administered 2020-05-01 – 2020-05-02 (×2): 8 [IU] via SUBCUTANEOUS
  Filled 2020-04-30 (×3): qty 0.08

## 2020-04-30 MED ORDER — POLYETHYLENE GLYCOL 3350 17 G PO PACK
17.0000 g | PACK | Freq: Every day | ORAL | Status: DC
  Administered 2020-05-01 – 2020-05-02 (×2): 17 g via ORAL
  Filled 2020-04-30 (×2): qty 1

## 2020-04-30 MED ORDER — METOPROLOL TARTRATE 25 MG PO TABS
25.0000 mg | ORAL_TABLET | Freq: Two times a day (BID) | ORAL | Status: DC
  Administered 2020-04-30 – 2020-05-01 (×3): 25 mg via ORAL
  Filled 2020-04-30 (×3): qty 1

## 2020-04-30 MED ORDER — SENNA 8.6 MG PO TABS
1.0000 | ORAL_TABLET | Freq: Two times a day (BID) | ORAL | Status: DC
  Administered 2020-04-30 – 2020-05-01 (×3): 8.6 mg via ORAL
  Filled 2020-04-30 (×3): qty 1

## 2020-04-30 MED ORDER — ENSURE ENLIVE PO LIQD
237.0000 mL | Freq: Three times a day (TID) | ORAL | Status: DC
  Administered 2020-04-30 – 2020-05-02 (×5): 237 mL via ORAL

## 2020-04-30 MED ORDER — APIXABAN 5 MG PO TABS
5.0000 mg | ORAL_TABLET | Freq: Two times a day (BID) | ORAL | Status: DC
  Administered 2020-04-30 – 2020-05-02 (×4): 5 mg via ORAL
  Filled 2020-04-30 (×4): qty 1

## 2020-04-30 MED ORDER — METOPROLOL TARTRATE 25 MG PO TABS: 25.0000 mg | ORAL_TABLET | Freq: Two times a day (BID) | ORAL | Status: DC

## 2020-04-30 MED ORDER — AMIODARONE HCL 200 MG PO TABS
400.0000 mg | ORAL_TABLET | Freq: Two times a day (BID) | ORAL | Status: DC
  Administered 2020-04-30 – 2020-05-02 (×4): 400 mg via ORAL
  Filled 2020-04-30 (×4): qty 2

## 2020-04-30 NOTE — H&P (Signed)
Physical Medicine and Rehabilitation Admission H&P     HPI: Johnathan Murray is a 69 year old right-handed male history of diabetes mellitus, PAF maintained on amiodarone as well as Eliquis, hypertension, bioprosthetic aortic valve replacement 2017, lumbar laminectomy decompression 06/11/2019 as well as recent C3-4 anterior cervical discectomy with interbody fusion for cervical stenosis myelopathy 03/02/2020 per [name].  Per chart review lives with spouse.  1 level home 7 steps to entry.  He was using a rolling walker prior to admission.  Wife works during the day.  Presented 03/19/2020 with low-grade fever and weakness of lower extremities.  Lactic acid 1.6, sodium 130, BUN 98, creatinine 6.40 with latest prior creatinine 1.6345 2021, WBC 12,600, hemoglobin 10.4, CK 20, urinalysis showed RBCs of 21-50 WBC 21-50 protein around 100 bacteria rare hyaline cast, SARS coronavirus negative.  His LFTs were markedly elevated AST 415 ALT 429 total bilirubin 5.5.  CT abdomen pelvis as well as CT cervical spine unremarkable.  MRI cervical spine negative for infection.  Patient was hypotensive received fluid bolus.  Placed on broad-spectrum antibiotics for suspected sepsis.  MRI lumbar spine findings consistent with acute osteomyelitis discitis L4-5.  Evidence for associated soft tissue infection cellulitis within the psoas musculature bilaterally with a few small superimposed abscesses.  Additional multilevel degenerative spondylosis most pronounced at L3-4.  Neurosurgery consulted no plan for surgical intervention underwent aspiration of disc space L4-5.  TEE showed no obvious vegetation and follow-up cardiology services.  Orthopedic services consulted Dr. Mardelle Matte for bilateral knee pain with noted swelling of left knee.  X-ray showed diffuse degenerative changes as well as large joint effusion.  Doppler showed ruptured Baker's cyst on the left lower extremity.  Patient underwent left lower extremity joint aspiration  03/21/2020 and right lower extremity joint aspiration 03/22/2020 both showing intracellular calcium pyrophosphate crystals WBC was up to 15,000 culture grew rare E Faecalis.  Patient did receive steroid injection of both knees.  Underwent I&D bilateral knees 03/24/2020.  Infectious disease continue to follow maintained on intravenous ampicillin x7 to 8 weeks through 05/20/2020 PICC line was placed 03/29/2020.  AKI on CKD stage III continue to improve multifactorial from bacteremia with latest creatinine 1.39.  Normocytic anemia postoperative patient was transfused 2 units packed red blood cells with latest hemoglobin 8.6 and WBC 9400.  Patient was admitted to inpatient rehab services 04/07/2020 for ongoing comprehensive inpatient rehab services.  Slow progressive gains with noted bouts of tachycardia followed by cardiology services.  His amiodarone had since been resumed now that his renal function had improved.  His Eliquis was ongoing as prior to admission.  Patient with bouts of low-grade fever followed by infectious disease.  Ceftriaxone was added to patient's ampicillin for increased coverage.  A follow-up TEE was completed findings of thoracic root abscess and patient was transferred to acute care services for ongoing care 04/13/2020.  Patient underwent redo sternotomy with ascending aortic root replacement 04/16/2020 per Dr. Cyndia Bent.  His Eliquis has been resumed.  Patient with persistent bouts of A. fib with RVR undergoing TEE/DCCV 04/29/2020 per cardiology services that showed,  bioprosthetic aortic valve well-seated without an perivalvular regurgitation.  No LA/LAA thrombus or masses and was initially placed on IV amiodarone and transition to p.o. amiodarone 200 mg twice daily as well as low-dose beta-blocker and close monitoring of heart rate.  Ceftriaxone ampicillin ongoing again followed by infectious disease through 05/20/2020.  Therapy evaluations completed and patient was admitted back to inpatient rehab services  for comprehensive therapies.  Pt reports LBM was 2 days ago- feels a little constipated- not too bad- is concerned about increasing pain meds, because could make him very constipated- explained will change bowel meds to compensate. Voiding well- pain still very bad- mainly in low back from osteomyelitis, however L knee swelling/suprapatellar bursitis also decreases ROM.  Told OT pain was "10/10".    Review of Systems  Constitutional: Positive for fever.  HENT: Negative for hearing loss.   Eyes: Negative for blurred vision and double vision.  Respiratory: Positive for shortness of breath.   Cardiovascular: Positive for palpitations and leg swelling.  Gastrointestinal: Positive for constipation. Negative for heartburn, nausea and vomiting.       GERD  Genitourinary: Positive for urgency.  Musculoskeletal: Positive for joint pain and myalgias.  Skin: Negative for rash.  Neurological: Positive for weakness.  All other systems reviewed and are negative.  Past Medical History:  Diagnosis Date  . Abnormal LFTs (liver function tests) 10/31/2016  . Abnormal nuclear stress test 05/03/2018  . Acid reflux 02/15/2018   not at present  . Acute otitis externa of left ear 11/13/2019  . Acute suppurative otitis media of left ear without spontaneous rupture of tympanic membrane 11/13/2019  . Anemia    low iron  . Aortic stenosis 05/03/2018  . Aortic valve abscess 04/13/2020  . ARF (acute renal failure) ([place]) 03/20/2020  . Arthritis 02/15/2018   Overview:  shoulder  . Bacteremia due to Enterococcus 03/21/2020  . Cervical myelopathy ([place]) 04/22/2019  . Conductive hearing loss of left ear 11/13/2019  . Coronary artery disease involving native coronary artery of native heart without angina pectoris 05/30/2016  . Coronary artery disease of native artery of native heart with stable angina pectoris ([place])    pt. denies  . Diabetes mellitus due to underlying condition with unspecified complications ([place])  6/76/7209  . Discitis 04/07/2020  . Dyslipidemia 05/30/2016  . Dyspnea    upon exertion,  as of 06/05/09 not having any recent chest pain  . Dysrhythmia    a fib  . Endocarditis 04/13/2020  . Essential hypertension 05/30/2016  . Heart murmur    Has had an Aortic valve replacement  . History of fusion of cervical spine 03/23/2020  . Hyperactive gag reflex 02/15/2018  . Hypothyroidism   . Intermittent claudication ([place]) 10/31/2018  . Lumbar stenosis with neurogenic claudication 06/11/2019  . Macular degeneration 02/15/2018  . PAF (paroxysmal atrial fibrillation) ([place]) 05/30/2016  . Pre-operative cardiovascular examination 01/03/2020  . Preoperative cardiovascular examination 04/17/2019  . 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 Protein-calorie malnutrition, severe 03/24/2020  . Pseudogout of left knee 03/21/2020  . Renal insufficiency 01/17/2020  . S/P aortic valve replacement 01/17/2020  . S/P AVR 01/17/2020  . Septic arthritis ([place]) 03/23/2020  . Tinnitus of left ear 11/13/2019  . Typical atrial flutter [facility])    Past Surgical History:  Procedure Laterality Date  . ANTERIOR CERVICAL DECOMP/DISCECTOMY FUSION N/A 04/22/2019   Procedure: Anterior Cervical Discectomy and Fusion, Cervical four-five, Cervical five-six;  Surgeon: Earnie Larsson, MD;  Location: [place];  Service: Neurosurgery;  Laterality: N/A;  . ANTERIOR CERVICAL DECOMP/DISCECTOMY FUSION N/A 03/02/2020   Procedure: Anterior Cervical Decompression Fusion  - Cervical three-Cervical four;  Surgeon: Earnie Larsson, MD;  Location: [place];  Service: Neurosurgery;  Laterality: N/A;  . AORTIC VALVE REPLACEMENT    . ASCENDING AORTIC ROOT REPLACEMENT N/A 04/16/2020   Procedure: ASCENDING AORTIC ROOT REPLACEMENT - HOMOGRAFT using [place] 23 MM Cryo Aortic Valve.;  Surgeon: Gaye Pollack, MD;  Location: [place];  Service: Open Heart Surgery;  Laterality: N/A;  . CARDIAC CATHETERIZATION     several stents  . CARDIAC SURGERY    . CARDIOVERSION N/A  04/13/2020   Procedure: CARDIOVERSION;  Surgeon: Geralynn Rile, MD;  Location: [place];  Service: Cardiovascular;  Laterality: N/A;  . CARDIOVERSION N/A 04/29/2020   Procedure: CARDIOVERSION;  Surgeon: Skeet Latch, MD;  Location[facility]COPY;  Service: Cardiovascular;  Laterality: N/A;  . CORONARY ARTERY BYPASS GRAFT  2017   patient unsure how many, had at [facility]  . HAND SURGERY    . IR LUMBAR [place] W/IMG GUIDE  04/07/2020  . IRRIGATION AND DEBRIDEMENT KNEE Bilateral 03/24/2020   Procedure: ARTHROSCOPY IRRIGATION AND DEBRIDEMENT BILATERAL  KNEE;  Surgeon: Marchia Bond, MD;  Location: [place];  Service: Orthopedics;  Laterality: Bilateral;  . KNEE SURGERY    . LEFT HEART CATH AND CORONARY ANGIOGRAPHY N/A 01/21/2020   Procedure: LEFT HEART CATH AND CORONARY ANGIOGRAPHY;  Surgeon: Belva Crome, MD;  Location: [place] CV LAB;  Service: Cardiovascular;  Laterality: N/A;  . LEFT HEART CATH AND CORS/GRAFTS ANGIOGRAPHY N/A 05/16/2018   Procedure: LEFT HEART CATH AND CORS/GRAFTS ANGIOGRAPHY;  Surgeon: Burnell Blanks, MD;  Location: [place] CV LAB;  Service: Cardiovascular;  Laterality: N/A;  . LUMBAR LAMINECTOMY/DECOMPRESSION MICRODISCECTOMY Bilateral 06/11/2019   Procedure: Bilateral Lumbar Four-FiveLaminectomy/Foraminotomy;  Surgeon: Earnie Larsson, MD;  Location: [place];  Service: Neurosurgery;  Laterality: Bilateral;  posterior  . TEE WITHOUT CARDIOVERSION N/A 03/26/2020   Procedure: TRANSESOPHAGEAL ECHOCARDIOGRAM (TEE);  Surgeon: Jerline Pain, MD;  Location: [facility] ENDOSCOPY;  Service: Cardiovascular;  Laterality: N/A;  . TEE WITHOUT CARDIOVERSION N/A 04/13/2020   Procedure: TRANSESOPHAGEAL ECHOCARDIOGRAM (TEE);  Surgeon: Geralynn Rile, MD;  Location: [place];  Service: Cardiovascular;  Laterality: N/A;  . TEE WITHOUT CARDIOVERSION N/A 04/16/2020   Procedure: TRANSESOPHAGEAL ECHOCARDIOGRAM (TEE);  Surgeon: Gaye Pollack, MD;  Location:  [place];  Service: Open Heart Surgery;  Laterality: N/A;  . TEE WITHOUT CARDIOVERSION N/A 04/29/2020   Procedure: TRANSESOPHAGEAL ECHOCARDIOGRAM (TEE);  Surgeon: Skeet Latch, MD;  Location[facility] ENDOSCOPY;  Service: Cardiovascular;  Laterality: N/A;   Family History  Problem Relation Age of Onset  . Prostate cancer Brother   . Heart disease Maternal Grandmother   . Brain cancer Mother    Social History:  reports that he has never smoked. He has never used smokeless tobacco. He reports previous alcohol use. He reports that he does not use drugs. Allergies:  Allergies  Allergen Reactions  . Atorvastatin Other (See Comments)    Urinary retention   . Nebivolol Diarrhea   Medications Prior to Admission  Medication Sig Dispense Refill  . acetaminophen (TYLENOL) 325 MG tablet Take 2 tablets (650 mg total) by mouth every 6 (six) hours as needed for moderate pain or fever.    Marland Kitchen amiodarone (PACERONE) 400 MG tablet Take 1 tablet (400 mg total) by mouth 2 (two) times daily for 7 days.    Marland Kitchen ampicillin 2 g in sodium chloride 0.9 % 100 mL Inject 2 g into the vein every 4 (four) hours.    Marland Kitchen apixaban (ELIQUIS) 5 MG TABS tablet Take 1 tablet (5 mg total) by mouth 2 (two) times daily. 180 tablet 3  . [START ON 05/01/2020] aspirin EC 81 MG EC tablet Take 1 tablet (81 mg total) by mouth daily.    . cefTRIAXone 2 g in sodium  chloride 0.9 % 100 mL Inject 2 g into the vein every 12 (twelve) hours.    . cyclobenzaprine (FLEXERIL) 5 MG tablet Take 1 tablet (5 mg total) by mouth 3 (three) times daily as needed for muscle spasms. 30 tablet 0  . docusate sodium (COLACE) 100 MG capsule Take 1 capsule (100 mg total) by mouth 2 (two) times daily. 10 capsule 0  . ferrous [id number] C-folic acid (TRINSICON / FOLTRIN) capsule Take 1 capsule by mouth daily with breakfast. For one month then stop.    . insulin aspart (NOVOLOG) 100 UNIT/ML injection Inject 0-15 Units into the skin 3 (three) times daily with meals.  10 mL 11  . insulin aspart (NOVOLOG) 100 UNIT/ML injection Inject 3 Units into the skin 3 (three) times daily with meals. 10 mL 11  . insulin glargine (LANTUS) 100 UNIT/ML injection Inject 0.1 mLs (10 Units total) into the skin 2 (two) times daily. 10 mL 11  . levothyroxine (SYNTHROID) 25 MCG tablet Take 25 mcg by mouth daily before breakfast.     . metoprolol tartrate (LOPRESSOR) 25 MG tablet Take 1 tablet (25 mg total) by mouth 2 (two) times daily.    Marland Kitchen oxyCODONE (OXYCONTIN) 10 mg 12 hr tablet Take 1 tablet (10 mg total) by mouth every 12 (twelve) hours. 14 tablet   . pregabalin (LYRICA) 100 MG capsule Take 1 capsule (100 mg total) by mouth 3 (three) times daily.    Marland Kitchen senna (SENOKOT) 8.6 MG TABS tablet Take 1 tablet (8.6 mg total) by mouth at bedtime. 120 tablet 0  . vitamin C (ASCORBIC ACID) 500 MG tablet Take 500 mg by mouth daily.       Drug Regimen Review  Drug Regimen was reviewed and remains appropriate with no significant issues identified  Home:     Functional History:    Functional Status:  Mobility:          ADL:    Cognition: Cognition Orientation Level: Oriented X4    Physical Exam: Blood pressure (!) 107/55, pulse 65, temperature 98.3 F (36.8 C), temperature source Oral, resp. rate 17, height 5\' 10"  (1.778 m), weight 81.1 kg, SpO2 100 %. Physical Exam  Nursing note and vitals reviewed. Constitutional: He is oriented to person, place, and time.  Awake, alert, sitting up in bed; eating lunch, appropriate, NAD  HENT:  Head: Normocephalic and atraumatic.  Right Ear: External ear normal.  Left Ear: External ear normal.  Nose: Nose normal.  Mouth/Throat: Mucous membranes are dry. Oropharynx is clear.  Eyes: Pupils are equal, round, and reactive to light. Conjunctivae are normal. Right eye exhibits no discharge. Left eye exhibits no discharge.  Cardiovascular: Normal pulses.  Regular rate and rhythm s/p Cardioversion- rate in 60s- no M/R/G   Respiratory:  Effort normal and breath sounds normal. No stridor. No respiratory distress. He has no wheezes. He has no rales.  GI: Soft. Normal appearance.  Soft, NT, slightly distended; hyperactive BS since eating  Musculoskeletal:     Cervical back: Normal range of motion and neck supple.     Comments: RUE- biceps 5/5, triceps 4+/5, WE, and grip 5/5, and finger abd 5-5 LUE- Biceps 5/5, triceps 4+/5, WE and grip 5/5; finger abd 5-/5 RLE- HF 3/5, KE/KF 4-/5, DF and PF 4+/5 LLE- HF 3-/5, KE/KF 3+/5, DF and PF 5-/5  Looks like has suprapatellar bursitis on L knee- squishy- slightly TTP  Neurological: He is alert and oriented to person, place, and time. No cranial nerve deficit.  Sensation intact in all 4 extremities to light touch No Hoffman's, no Clonus   Skin: Skin is warm and dry.  Backside slightly pink- healed stage II- and pink is blanchable currently- no significant moisture Sternal incision already detailed Ecchymoses on Arms B/L  Psychiatric: Mood normal.  Brighter affect then when seen last    Results for orders placed or performed during the hospital encounter of 04/13/20 (from the past 48 hour(s))  Glucose, capillary     Status: Abnormal   Collection Time: 04/28/20  4:31 PM  Result Value Ref Range   Glucose-Capillary 173 (H) 70 - 99 mg/dL    Comment: Glucose reference range applies only to samples taken after fasting for at least 8 hours.  Glucose, capillary     Status: Abnormal   Collection Time: 04/28/20  9:19 PM  Result Value Ref Range   Glucose-Capillary 108 (H) 70 - 99 mg/dL    Comment: Glucose reference range applies only to samples taken after fasting for at least 8 hours.  Basic metabolic panel     Status: Abnormal   Collection Time: 04/29/20  5:00 AM  Result Value Ref Range   Sodium 137 135 - 145 mmol/L   Potassium 3.7 3.5 - 5.1 mmol/L   Chloride 102 98 - 111 mmol/L   CO2 25 22 - 32 mmol/L   Glucose, Bld 121 (H) 70 - 99 mg/dL    Comment: Glucose reference range applies  only to samples taken after fasting for at least 8 hours.   BUN 16 8 - 23 mg/dL   Creatinine, Ser 1.28 (H) 0.61 - 1.24 mg/dL   Calcium 8.7 (L) 8.9 - 10.3 mg/dL   GFR calc non Af Amer 58 (L) >60 mL/min   GFR calc Af Amer >60 >60 mL/min   Anion gap 10 5 - 15    Comment: Performed at [facility] [address], [place],  [address]  CBC     Status: Abnormal   Collection Time: 04/29/20  5:00 AM  Result Value Ref Range   WBC 7.8 4.0 - 10.5 K/uL   RBC 2.84 (L) 4.22 - 5.81 MIL/uL   Hemoglobin 8.3 (L) 13.0 - 17.0 g/dL   HCT 27.1 (L) 39 - 52 %   MCV 95.4 80.0 - 100.0 fL   MCH 29.2 26.0 - 34.0 pg   MCHC 30.6 30.0 - 36.0 g/dL   RDW 14.9 11.5 - 15.5 %   Platelets 328 150 - 400 K/uL   nRBC 0.0 0.0 - 0.2 %    Comment: Performed at [facility], [place] [address] [place], [place] [address]  Glucose, capillary     Status: Abnormal   Collection Time: 04/29/20  6:02 AM  Result Value Ref Range   Glucose-Capillary 124 (H) 70 - 99 mg/dL    Comment: Glucose reference range applies only to samples taken after fasting for at least 8 hours.  Glucose, capillary     Status: Abnormal   Collection Time: 04/29/20  1:22 PM  Result Value Ref Range   Glucose-Capillary 119 (H) 70 - 99 mg/dL    Comment: Glucose reference range applies only to samples taken after fasting for at least 8 hours.  Glucose, capillary     Status: Abnormal   Collection Time: 04/29/20  4:54 PM  Result Value Ref Range   Glucose-Capillary 155 (H) 70 - 99 mg/dL    Comment: Glucose reference range applies only to samples taken after fasting for at  least 8 hours.  Glucose, capillary     Status: Abnormal   Collection Time: 04/29/20  8:51 PM  Result Value Ref Range   Glucose-Capillary 178 (H) 70 - 99 mg/dL    Comment: Glucose reference range applies only to samples taken after fasting for at least 8 hours.  Glucose, capillary     Status: Abnormal   Collection Time: 04/30/20  6:12 AM  Result Value Ref Range    Glucose-Capillary 142 (H) 70 - 99 mg/dL    Comment: Glucose reference range applies only to samples taken after fasting for at least 8 hours.  Glucose, capillary     Status: Abnormal   Collection Time: 04/30/20 11:46 AM  Result Value Ref Range   Glucose-Capillary 135 (H) 70 - 99 mg/dL    Comment: Glucose reference range applies only to samples taken after fasting for at least 8 hours.   Comment 1 Notify RN    Comment 2 Document in Chart    ECHO TEE  Result Date: 04/29/2020    TRANSESOPHOGEAL ECHO REPORT   Patient Name:   Johnathan Murray Date of Exam: 04/29/2020 Medical Rec #:  [id number]           Height:       70.0 in Accession #:    [id number]          Weight:       177.7 lb Date of Birth:  [birth year]           BSA:          1.985 m Patient Age:    22 years            BP:           76/37 mmHg Patient Gender: M                   HR:           68 bpm. Exam Location:  Inpatient Procedure: Transesophageal Echo, Cardiac Doppler and Color Doppler Indications:     Atrial fibrillation 427.31 / I48.91  History:         Patient has prior history of Echocardiogram examinations, most                  recent 04/16/2020. CAD, Aortic Valve Disease, Arrythmias:Atrial                  Fibrillation and Atrial Flutter, Signs/Symptoms:Bacteremia;                  Risk Factors:Hypertension, Dyslipidemia and Non-Smoker.                  Aortic Valve: 23 mm Homograft valve/aortic root valve is                  present in the aortic position. Procedure Date: 04/16/2020.  Sonographer:     Vickie Epley RDCS Referring Phys:  [id number] Gaye Pollack Diagnosing Phys: Skeet Latch MD PROCEDURE: After discussion of the risks and benefits of a TEE, an informed consent was obtained from the patient. The transesophogeal probe was passed without difficulty through the esophogus of the patient. Sedation performed by different physician. The patient was monitored while under deep sedation. Anesthestetic sedation was provided intravenously  by Anesthesiology: 317.97mg  of Propofol. Image quality was excellent. The patient's vital signs; including heart rate, blood pressure, and oxygen saturation; remained stable throughout the procedure. The patient developed no complications during the procedure.  A successful direct current cardioversion was performed at 200 joules with 1 attempt. IMPRESSIONS  1. Left ventricular ejection fraction, by estimation, is 55 to 60%. The left ventricle has normal function. The left ventricle has no regional wall motion abnormalities.  2. Right ventricular systolic function is mildly reduced. The right ventricular size is normal.  3. No left atrial/left atrial appendage thrombus was detected. The LAA emptying velocity was 50 cm/s.  4. Multiple regurgitant jets each with mild regurgitation. The mitral valve is normal in structure. Mild to moderate mitral valve regurgitation. No evidence of mitral stenosis.  5. Tricuspid valve regurgitation is mild to moderate.  6. The aortic valve has been repaired/replaced. Aortic valve regurgitation is trivial. No aortic stenosis is present. There is a 23 mm Homograft valve/aortic root valve present in the aortic position. Procedure Date: 04/16/2020. Echo findings are consistent with normal structure and function of the aortic valve prosthesis.  7. The inferior vena cava is normal in size with greater than 50% respiratory variability, suggesting right atrial pressure of 3 mmHg. Conclusion(s)/Recommendation(s): Normal biventricular function without evidence of hemodynamically significant valvular heart disease. FINDINGS  Left Ventricle: Left ventricular ejection fraction, by estimation, is 55 to 60%. The left ventricle has normal function. The left ventricle has no regional wall motion abnormalities. The left ventricular internal cavity size was normal in size. There is  no left ventricular hypertrophy. Right Ventricle: The right ventricular size is normal. No increase in right ventricular  wall thickness. Right ventricular systolic function is mildly reduced. Left Atrium: Left atrial size was normal in size. No left atrial/left atrial appendage thrombus was detected. The LAA emptying velocity was 50 cm/s. Right Atrium: Right atrial size was normal in size. Pericardium: There is no evidence of pericardial effusion. Mitral Valve: Multiple regurgitant jets each with mild regurgitation. The mitral valve is normal in structure. Normal mobility of the mitral valve leaflets. Mild to moderate mitral valve regurgitation. No evidence of mitral valve stenosis. Tricuspid Valve: The tricuspid valve is normal in structure. Tricuspid valve regurgitation is mild to moderate. No evidence of tricuspid stenosis. Aortic Valve: No perivalvular regurgitation or abscess. The aortic valve has been repaired/replaced. Aortic valve regurgitation is trivial. No aortic stenosis is present. There is a 23 mm Homograft valve/aortic root valve present in the aortic position. Procedure Date: 04/16/2020. Echo findings are consistent with normal structure and function of the aortic valve prosthesis. Pulmonic Valve: The pulmonic valve was normal in structure. Pulmonic valve regurgitation is trivial. No evidence of pulmonic stenosis. Aorta: The aortic root is normal in size and structure. There is minimal (Grade I) plaque involving the descending aorta. Venous: The inferior vena cava is normal in size with greater than 50% respiratory variability, suggesting right atrial pressure of 3 mmHg. IAS/Shunts: No atrial level shunt detected by color flow Doppler. There is no evidence of a patent foramen ovale. There is no evidence of an atrial septal defect.  MR Peak grad:    75.3 mmHg   TRICUSPID VALVE MR Mean grad:    42.0 mmHg   TR Peak grad:   18.1 mmHg MR Vmax:         434.00 cm/s TR Vmax:        213.00 cm/s MR Vmean:        296.0 cm/s MR PISA:         1.01 cm MR PISA Eff ROA: 9 mm MR PISA Radius:  0.40 cm Skeet Latch MD  Electronically signed by Skeet Latch MD  Signature Date/Time: 04/29/2020/1:20:20 PM    Final        Medical Problem List and Plan: 1.  Decreased functional mobility secondary to sepsis/Enterococcus faecalis bacteremia/discitis/osteomyelitis of L4/5 s/p aortic root replacement 04/17/20 s/p cardioversion for Afib with  STERNAL PRECAUTIONS  -patient may  shower  -ELOS/Goals: 2-2.5 weeks- mod I to supervision 2.  Antithrombotics: -DVT/anticoagulation: Eliquis resumed  -antiplatelet therapy: Aspirin 81 mg daily 3. Pain Management: Lyrica 100 mg 3 times daily, OxyContin 10 mg every 12 hours, Flexeril as needed, tramadol as needed- will add Oxycodone 5 mg q4 hours prn- and advise pt to take bowel meds to prevent constipation 4. Mood: Provide emotional support  -antipsychotic agents: N/A 5. Neuropsych: This patient is capable of making decisions on his own behalf. 6. Skin/Wound Care: Routine skin checks 7. Fluids/Electrolytes/Nutrition: Routine in and outs with follow-up chemistries 8.  ID/Enterococcus bacteremia.  Continue intravenous ampicillin 2 g every 4 hours and ceftriaxone 2 g every 12 hours through 05/20/2020.  Follow-up infectious disease 9.  AKI.  Follow-up chemistries 10.  Thoracic root abscess.  Status post sternotomy with ascending aortic root replacement 04/16/2020 as well as history of bioprosthetic aortic valve replacement 2017. 12.  PAF.  Amiodarone 400 mg BID x7 days then 200 mg daily,Lopressor 25 mg twice daily.  Cardiac rate controlled 12.  Diabetes mellitus.  Hemoglobin A1c 6.8.  Levemir 8 units daily.  Check blood sugars before meals and at bedtime 13.  Acute on chronic anemia.  Follow-up CBC.  Continue iron supplement 14.  Septic knee.  Status post aspiration and steroid injection with I&D of bilateral knees 03/24/2020.  Follow-up orthopedic services 15.  Recent C3-4 ACDF 03/02/2020 per [name] for cervical myelopathy.  Cervical collar discontinued 16.  Hypothyroidism.   Synthroid 17.  Hypertension.  Continue Lopressor Lopressor 25 mg twice daily 18.  Constipation.  Continue Colace as well as scheduled MiraLAX- will also add Senokot and Sorbitol if no BM in 2 days 19.  GERD.  Protonix   Lavon Paganini. Anguiili, PA-C, 04/30/2020    I have personally performed a face to face diagnostic evaluation of this patient and formulated the key components of the plan.  Additionally, I have personally reviewed laboratory data, imaging studies, as well as relevant notes and concur with the physician assistant's documentation above.   The patient's status has not changed from the original H&P.  Any changes in documentation from the acute care chart have been noted above.     Courtney Heys, MD 04/30/2020

## 2020-04-30 NOTE — Progress Notes (Signed)
Patient arrived to unit via bed A/Ox4. Denies pain. Plan of care, patient safety plan, and visitors policy has been reviewed with patient. Oriented to room and unit, explained the use of the call bell and how to operate functions. Will continue to monitor.Amanda Cockayne, LPN

## 2020-04-30 NOTE — Plan of Care (Signed)
  Problem: Consults Goal: RH GENERAL PATIENT EDUCATION Description: See Patient Education module for education specifics. 04/30/2020 1858 by Toy Cookey F, LPN Outcome: Progressing 04/30/2020 1858 by Amanda Cockayne, LPN Outcome: Progressing   Problem: RH SKIN INTEGRITY Goal: RH STG SKIN FREE OF INFECTION/BREAKDOWN Description: Skin remain free of breakdown with min assist 04/30/2020 1858 by Amanda Cockayne, LPN Outcome: Progressing 04/30/2020 1858 by Amanda Cockayne, LPN Outcome: Progressing   Problem: RH PAIN MANAGEMENT Goal: RH STG PAIN MANAGED AT OR BELOW PT'S PAIN GOAL Description: Less than 4 04/30/2020 1858 by Amanda Cockayne, LPN Outcome: Progressing 04/30/2020 1858 by Amanda Cockayne, LPN Outcome: Progressing

## 2020-04-30 NOTE — Progress Notes (Signed)
Cardiology Progress Note  Patient ID: Johnathan Murray MRN: [id number] DOB: Dec 22, 1950 Date of Encounter: 04/30/2020  Primary Cardiologist: Jenean Lindau, MD  Subjective   Chief Complaint: Atrial fibrillation  HPI: Doing well this morning. In normal sinus rhythm.  ROS:  All other ROS reviewed and negative. Pertinent positives noted in the HPI.     Inpatient Medications  Scheduled Meds: . amiodarone  400 mg Oral BID  . apixaban  5 mg Oral BID  . vitamin C  500 mg Oral Daily  . aspirin EC  81 mg Oral Daily  . bisacodyl  10 mg Oral Daily   Or  . bisacodyl  10 mg Rectal Daily  . Chlorhexidine Gluconate Cloth  6 each Topical Daily  . diphenhydrAMINE  25 mg Oral QHS  . docusate sodium  200 mg Oral Daily  . feeding supplement (ENSURE ENLIVE)  237 mL Oral TID BM  . ferrous [id number] C-folic acid  1 capsule Oral BID PC  . insulin aspart  0-24 Units Subcutaneous TID WC  . insulin detemir  8 Units Subcutaneous Daily  . levothyroxine  25 mcg Oral QAC breakfast  . metoprolol tartrate  25 mg Oral BID  . oxyCODONE  10 mg Oral Q12H  . pantoprazole  40 mg Oral Daily  . polyethylene glycol  17 g Oral Daily  . pregabalin  100 mg Oral TID  . sodium chloride flush  10-40 mL Intracatheter Q12H  . sodium chloride flush  3 mL Intravenous Q12H   Continuous Infusions: . sodium chloride Stopped (04/18/20 1418)  . sodium chloride    . sodium chloride 500 mL (04/28/20 2346)  . ampicillin IVPB 2 gram/NS 100 mL (Mini-Bag Plus) 2 g (04/30/20 0842)  . cefTRIAXone (ROCEPHIN) IVPB 2 gram/100 mL NS (Mini-Bag Plus) 2 g (04/29/20 2312)  . lactated ringers Stopped (04/16/20 2018)   PRN Meds: sodium chloride, sodium chloride, cyclobenzaprine, dextrose, morphine injection, ondansetron (ZOFRAN) IV, sodium chloride flush, sodium chloride flush, traMADol   Vital Signs   Vitals:   04/29/20 1948 04/29/20 2346 04/30/20 0418 04/30/20 0756  BP: (!) 108/58 114/69 119/69 136/73  Pulse: 79 73  71 80  Resp: 18 16 18 18   Temp: 97.7 F (36.5 C) 99.2 F (37.3 C) 98.5 F (36.9 C) 98.3 F (36.8 C)  TempSrc: Oral Oral Oral Oral  SpO2: 100% 98% 98% 100%  Weight:      Height:        Intake/Output Summary (Last 24 hours) at 04/30/2020 1049 Last data filed at 04/30/2020 1047 Gross per 24 hour  Intake --  Output 800 ml  Net -800 ml   Last 3 Weights 04/29/2020 04/28/2020 04/27/2020  Weight (lbs) 177 lb 11.1 oz 179 lb 3.7 oz 175 lb 0.7 oz  Weight (kg) 80.6 kg 81.3 kg 79.4 kg      Telemetry  Overnight telemetry shows normal sinus rhythm with heart rate in the 60s, which I personally reviewed.   ECG  The most recent ECG shows sinus bradycardia, heart rate 53, old anteroseptal infarct, which I personally reviewed.   Physical Exam   Vitals:   04/29/20 1948 04/29/20 2346 04/30/20 0418 04/30/20 0756  BP: (!) 108/58 114/69 119/69 136/73  Pulse: 79 73 71 80  Resp: 18 16 18 18   Temp: 97.7 F (36.5 C) 99.2 F (37.3 C) 98.5 F (36.9 C) 98.3 F (36.8 C)  TempSrc: Oral Oral Oral Oral  SpO2: 100% 98% 98% 100%  Weight:  Height:         Intake/Output Summary (Last 24 hours) at 04/30/2020 1049 Last data filed at 04/30/2020 1047 Gross per 24 hour  Intake --  Output 800 ml  Net -800 ml    Last 3 Weights 04/29/2020 04/28/2020 04/27/2020  Weight (lbs) 177 lb 11.1 oz 179 lb 3.7 oz 175 lb 0.7 oz  Weight (kg) 80.6 kg 81.3 kg 79.4 kg    Body mass index is 25.5 kg/m.   General: Well nourished, well developed, in no acute distress Head: Atraumatic, normal size  Eyes: PEERLA, EOMI  Neck: Supple, no JVD Endocrine: No thryomegaly Cardiac: Normal S1, S2; faint 2 out of 6 systolic ejection murmur Lungs: Clear to auscultation bilaterally, no wheezing, rhonchi or rales  Abd: Soft, nontender, no hepatomegaly  Ext: No edema, pulses 2+ Musculoskeletal: No deformities, BUE and BLE strength normal and equal Skin: Warm and dry, no rashes   Neuro: Alert and oriented to person, place, time, and  situation, CNII-XII grossly intact, no focal deficits  Psych: Normal mood and affect   Labs  High Sensitivity Troponin:  No results for input(s): TROPONINIHS in the last 720 hours.   Cardiac EnzymesNo results for input(s): TROPONINI in the last 168 hours. No results for input(s): TROPIPOC in the last 168 hours.  Chemistry Recent Labs  Lab 04/24/20 0524 04/26/20 0315 04/29/20 0500  NA 138 137 137  K 3.9 4.0 3.7  CL 102 99 102  CO2 27 26 25   GLUCOSE 109* 176* 121*  BUN 17 19 16   CREATININE 1.35* 1.39* 1.28*  CALCIUM 8.6* 8.9 8.7*  GFRNONAA 54* 52* 58*  GFRAA >60 >60 >60  ANIONGAP 9 12 10     Hematology Recent Labs  Lab 04/26/20 0315 04/29/20 0500  WBC 9.4 7.8  RBC 2.92* 2.84*  HGB 8.6* 8.3*  HCT 28.0* 27.1*  MCV 95.9 95.4  MCH 29.5 29.2  MCHC 30.7 30.6  RDW 15.4 14.9  PLT 321 328   BNPNo results for input(s): BNP, PROBNP in the last 168 hours.  DDimer No results for input(s): DDIMER in the last 168 hours.   Radiology  ECHO TEE  Result Date: 04/29/2020    TRANSESOPHOGEAL ECHO REPORT   Patient Name:   Johnathan [place] Date of Exam: 04/29/2020 Medical Rec #:  [id number]           Height:       70.0 in Accession #:    [id number]          Weight:       177.7 lb Date of Birth:  1951/05/11           BSA:          1.985 m Patient Age:    69 years            BP:           76/37 mmHg Patient Gender: M                   HR:           68 bpm. Exam Location:  Inpatient Procedure: Transesophageal Echo, Cardiac Doppler and Color Doppler Indications:     Atrial fibrillation 427.31 / I48.91  History:         Patient has prior history of Echocardiogram examinations, most                  recent 04/16/2020. CAD, Aortic Valve Disease, Arrythmias:Atrial  Fibrillation and Atrial Flutter, Signs/Symptoms:Bacteremia;                  Risk Factors:Hypertension, Dyslipidemia and Non-Smoker.                  Aortic Valve: 23 mm Homograft valve/aortic root valve is                  present  in the aortic position. Procedure Date: 04/16/2020.  Sonographer:     Vickie Epley RDCS Referring Phys:  [id number] Gaye Pollack Diagnosing Phys: Skeet Latch MD PROCEDURE: After discussion of the risks and benefits of a TEE, an informed consent was obtained from the patient. The transesophogeal probe was passed without difficulty through the esophogus of the patient. Sedation performed by different physician. The patient was monitored while under deep sedation. Anesthestetic sedation was provided intravenously by Anesthesiology: 317.97mg  of Propofol. Image quality was excellent. The patient's vital signs; including heart rate, blood pressure, and oxygen saturation; remained stable throughout the procedure. The patient developed no complications during the procedure. A successful direct current cardioversion was performed at 200 joules with 1 attempt. IMPRESSIONS  1. Left ventricular ejection fraction, by estimation, is 55 to 60%. The left ventricle has normal function. The left ventricle has no regional wall motion abnormalities.  2. Right ventricular systolic function is mildly reduced. The right ventricular size is normal.  3. No left atrial/left atrial appendage thrombus was detected. The LAA emptying velocity was 50 cm/s.  4. Multiple regurgitant jets each with mild regurgitation. The mitral valve is normal in structure. Mild to moderate mitral valve regurgitation. No evidence of mitral stenosis.  5. Tricuspid valve regurgitation is mild to moderate.  6. The aortic valve has been repaired/replaced. Aortic valve regurgitation is trivial. No aortic stenosis is present. There is a 23 mm Homograft valve/aortic root valve present in the aortic position. Procedure Date: 04/16/2020. Echo findings are consistent with normal structure and function of the aortic valve prosthesis.  7. The inferior vena cava is normal in size with greater than 50% respiratory variability, suggesting right atrial pressure of 3 mmHg.  Conclusion(s)/Recommendation(s): Normal biventricular function without evidence of hemodynamically significant valvular heart disease. FINDINGS  Left Ventricle: Left ventricular ejection fraction, by estimation, is 55 to 60%. The left ventricle has normal function. The left ventricle has no regional wall motion abnormalities. The left ventricular internal cavity size was normal in size. There is  no left ventricular hypertrophy. Right Ventricle: The right ventricular size is normal. No increase in right ventricular wall thickness. Right ventricular systolic function is mildly reduced. Left Atrium: Left atrial size was normal in size. No left atrial/left atrial appendage thrombus was detected. The LAA emptying velocity was 50 cm/s. Right Atrium: Right atrial size was normal in size. Pericardium: There is no evidence of pericardial effusion. Mitral Valve: Multiple regurgitant jets each with mild regurgitation. The mitral valve is normal in structure. Normal mobility of the mitral valve leaflets. Mild to moderate mitral valve regurgitation. No evidence of mitral valve stenosis. Tricuspid Valve: The tricuspid valve is normal in structure. Tricuspid valve regurgitation is mild to moderate. No evidence of tricuspid stenosis. Aortic Valve: No perivalvular regurgitation or abscess. The aortic valve has been repaired/replaced. Aortic valve regurgitation is trivial. No aortic stenosis is present. There is a 23 mm Homograft valve/aortic root valve present in the aortic position. Procedure Date: 04/16/2020. Echo findings are consistent with normal structure and function of the aortic valve prosthesis. Pulmonic Valve: The  pulmonic valve was normal in structure. Pulmonic valve regurgitation is trivial. No evidence of pulmonic stenosis. Aorta: The aortic root is normal in size and structure. There is minimal (Grade I) plaque involving the descending aorta. Venous: The inferior vena cava is normal in size with greater than 50%  respiratory variability, suggesting right atrial pressure of 3 mmHg. IAS/Shunts: No atrial level shunt detected by color flow Doppler. There is no evidence of a patent foramen ovale. There is no evidence of an atrial septal defect.  MR Peak grad:    75.3 mmHg   TRICUSPID VALVE MR Mean grad:    42.0 mmHg   TR Peak grad:   18.1 mmHg MR Vmax:         434.00 cm/s TR Vmax:        213.00 cm/s MR Vmean:        296.0 cm/s MR PISA:         1.01 cm MR PISA Eff ROA: 9 mm MR PISA Radius:  0.40 cm Skeet Latch MD Electronically signed by Skeet Latch MD Signature Date/Time: 04/29/2020/1:20:20 PM    Final     Cardiac Studies  TEE 04/29/2020  1. Left ventricular ejection fraction, by estimation, is 55 to 60%. The  left ventricle has normal function. The left ventricle has no regional  wall motion abnormalities.  2. Right ventricular systolic function is mildly reduced. The right  ventricular size is normal.  3. No left atrial/left atrial appendage thrombus was detected. The LAA  emptying velocity was 50 cm/s.  4. Multiple regurgitant jets each with mild regurgitation. The mitral  valve is normal in structure. Mild to moderate mitral valve regurgitation.  No evidence of mitral stenosis.  5. Tricuspid valve regurgitation is mild to moderate.  6. The aortic valve has been repaired/replaced. Aortic valve  regurgitation is trivial. No aortic stenosis is present. There is a 23 mm  Homograft valve/aortic root valve present in the aortic position.  Procedure Date: 04/16/2020. Echo findings are  consistent with normal structure and function of the aortic valve  prosthesis.  7. The inferior vena cava is normal in size with greater than 50%  respiratory variability, suggesting right atrial pressure of 3 mmHg.   Patient Profile  69 y.o. male with history of CAD s/p CABG, AVR, pAF, DM who was admitted with sepsis and found to have aortic root abscess. He is s/p aortic valve replacement and aortic repair  (23 mm homograft root replacement with coronary reimplant 04/16/2020. He was cardioverted initially for Aflutter 04/13/2020 and now developed Afib 04/27/2020.  Assessment & Plan   1. Postop A. Fib -Status post TEE/cardioversion yesterday. Would recommend to continue oral amiodarone load 400 twice daily x7 days and then 200 mg daily for 21 days. Would recommend he probably stay on amiodarone given his second recurrence until he sees his primary cardiologist Dr. Geraldo Pitter after discharge. -Continue metoprolol 25 mg twice daily -Continue Eliquis 5 mg twice daily  [facility] will sign off.   Medication Recommendations: Amiodarone, metoprolol, Eliquis as above Other recommendations (labs, testing, etc): He will need an echocardiogram and follow-up with his primary cardiologist Follow up as an outpatient: Please arrange follow-up with Dr. Geraldo Pitter upon discharge. Please notify our group once he is close to discharge and we can arrange this appointment. He will go to inpatient rehab but we do not know his ultimate discharge date but please reach out to [place] for scheduling that appointment.  For questions or updates, please contact [facility] Please consult [email or web address] for contact info under   Time Spent with Patient: I have spent a total of 15 minutes with patient reviewing hospital notes, telemetry, EKGs, labs and examining the patient as well as establishing an assessment and plan that was discussed with the patient.  > 50% of time was spent in direct patient care.    Signed, Addison Naegeli. Audie Box, [place]  04/30/2020 10:49 AM

## 2020-04-30 NOTE — Progress Notes (Signed)
PMR Admission Coordinator Pre-Admission Assessment   Patient: Johnathan Murray is an 69 y.o., male MRN: [id number] DOB: Dec 11, 1950 Height: _0  (177.8 cm) Weight: 80.6 kg   Insurance Information HMO:     PPO: yes     PCP:      IPA:      80/20:      OTHER:  PRIMARY: [employer] [place]      Policy#: [id number]      Subscriber: pt CM Name: Kenney Houseman      Phone#: [phone]     Fax#: [phone] Pre-Cert#: [id number] auth for CIR provided by Kenney Houseman with [employer] [place], updates due via fax on 6/17  Employer:  Benefits:  Phone #: [phone]     Name:  Eff. Date: 11/21/2013     Deduct: $600 (met $600)      Out of Pocket Max: $1800 (met $1800)      Life Max: n/a CIR: 80%      SNF: 80% Outpatient:      Co-Pay: $30/visit Home Health: 100%      Co-Pay:  DME: 80%     Co-Pay: 20% Providers:   SECONDARY:       Policy#:      Phone#:     Development worker, community:       Phone#:    The Therapist, art Information Summary" for patients in Inpatient Rehabilitation Facilities with attached "Privacy Act [place] Records" was provided and verbally reviewed with: Patient and Family   Emergency Contact Information         Contact Information     Name Relation Home Work Mobile    Bigley,[name] Spouse [phone]   [phone]         Current Medical History  Patient Admitting Diagnosis: endocarditis, discitis, bilat septic knees   History of Present Illness: Johnathan Murray is a 69 year old right-handed male history of diabetes mellitus, PAF maintained on amiodarone as well as Eliquis, hypertension, bioprosthetic aortic valve replacement 2017, lumbar laminectomy decompression 06/11/2019 as well as recent C3-4 anterior cervical discectomy with interbody fusion for cervical stenosis myelopathy 03/02/2020 per [name].  Presented 03/19/2020 with low-grade fever and weakness of lower extremities.  Lactic acid 1.6, sodium 130, BUN 98, creatinine 6.40 with latest prior creatinine 1.6345 2021, WBC 12,600,  hemoglobin 10.4, CK 20, urinalysis showed RBCs of 21-50 WBC 21-50 protein around 100 bacteria rare hyaline cast, SARS coronavirus negative.  His LFTs were markedly elevated AST 415 ALT 429 total bilirubin 5.5.  CT abdomen pelvis as well as CT cervical spine unremarkable.  MRI cervical spine negative for infection.  Patient was hypotensive received fluid bolus.  Placed on broad-spectrum antibiotics for suspected sepsis.  MRI lumbar spine findings consistent with acute osteomyelitis discitis L4-5.  Evidence for associated soft tissue infection cellulitis within the psoas musculature bilaterally with a few small superimposed abscesses.  Additional multilevel degenerative spondylosis most pronounced at L3-4.  Neurosurgery consulted no plan for surgical intervention underwent aspiration of disc space L4-5.  TEE showed no obvious vegetation and follow-up cardiology services.  Orthopedic services consulted Dr. Mardelle Matte for bilateral knee pain with noted swelling of left knee.  X-ray showed diffuse degenerative changes as well as large joint effusion.  Doppler showed ruptured Baker's cyst on the left lower extremity.  Patient underwent left lower extremity joint aspiration 03/21/2020 and right lower extremity joint aspiration 03/22/2020 both showing intracellular calcium pyrophosphate crystals WBC was up to 15,000 culture grew rare E Faecalis.  Patient did receive steroid injection  of both knees.  Underwent I&D bilateral knees 03/24/2020.  Infectious disease continue to follow maintained on intravenous ampicillin x7 to 8 weeks through 05/20/2020 PICC line was placed 03/29/2020.  AKI on CKD stage III continue to improve multifactorial from bacteremia with latest creatinine 1.39.  Normocytic anemia postoperative patient was transfused 2 units packed red blood cells with latest hemoglobin 8.6 and WBC 9400.  Patient was admitted to inpatient rehab services 04/07/2020 for ongoing comprehensive inpatient rehab services.  Slow progressive  gains with noted bouts of tachycardia followed by cardiology services.  His amiodarone had since been resumed now that his renal function had improved.  His Eliquis was ongoing as prior to admission.  Patient with bouts of low-grade fever followed by infectious disease.  Ceftriaxone was added to patient's ampicillin for increased coverage.  A follow-up TEE was completed findings of thoracic root abscess and patient was transferred to acute care services for ongoing care 04/13/2020.  Patient underwent redo sternotomy with ascending aortic root replacement 04/16/2020 per Dr. Cyndia Bent.  His Eliquis has been resumed.  Patient with persistent bouts of A. fib with RVR undergoing TEE/DCCV 04/29/2020 per cardiology services and was initially placed on IV amiodarone and transition to p.o. amiodarone 200 mg twice daily as well as low-dose beta-blocker and close monitoring of heart rate.  Ceftriaxone ampicillin ongoing again followed by infectious disease through 05/20/2020.  Therapy evaluations completed and patient was recommended for inpatient rehab services.   Patient's medical record from [facility] has been reviewed by the rehabilitation admission coordinator and physician.   Past Medical History      Past Medical History:  Diagnosis Date  . Abnormal LFTs (liver function tests) 10/31/2016  . Abnormal nuclear stress test 05/03/2018  . Acid reflux 02/15/2018    not at present  . Acute otitis externa of left ear 11/13/2019  . Acute suppurative otitis media of left ear without spontaneous rupture of tympanic membrane 11/13/2019  . Anemia      low iron  . Aortic stenosis 05/03/2018  . Aortic valve abscess 04/13/2020  . ARF (acute renal failure) ([place]) 03/20/2020  . Arthritis 02/15/2018    Overview:  shoulder  . Bacteremia due to Enterococcus 03/21/2020  . Cancer ([place])      skin cancer on left wrist  . Cervical myelopathy ([place]) 04/22/2019  . Conductive hearing loss of left ear 11/13/2019  . Coronary artery  disease involving native coronary artery of native heart without angina pectoris 05/30/2016  . Coronary artery disease of native artery of native heart with stable angina pectoris ([place])      pt. denies  . Diabetes mellitus due to underlying condition with unspecified complications ([place]) 1/61/0960  . Discitis 04/07/2020  . Dyslipidemia 05/30/2016  . Dyspnea      upon exertion,  as of 06/05/09 not having any recent chest pain  . Dysrhythmia      a fib  . Endocarditis 04/13/2020  . Essential hypertension 05/30/2016  . Heart murmur      Has had an Aortic valve replacement  . History of fusion of cervical spine 03/23/2020  . Hyperactive gag reflex 02/15/2018  . Hypothyroidism    . Intermittent claudication ([place]) 10/31/2018  . Lumbar stenosis with neurogenic claudication 06/11/2019  . Macular degeneration 02/15/2018  . PAF (paroxysmal atrial fibrillation) ([place]) 05/30/2016  . Pre-operative cardiovascular examination 01/03/2020  . Preoperative cardiovascular examination 04/17/2019  . Prosthetic valve endocarditis ([place]) 04/13/2020  . Protein-calorie malnutrition, severe 03/24/2020  . Pseudogout of  left knee 03/21/2020  . Renal insufficiency 01/17/2020  . S/P aortic valve replacement 01/17/2020  . S/P AVR 01/17/2020  . Septic arthritis ([place]) 03/23/2020  . Tinnitus of left ear 11/13/2019  . Typical atrial flutter ([facility])        Family History   family history includes Brain cancer in his mother; Heart disease in his maternal grandmother; Prostate cancer in his brother.   Prior Rehab/Hospitalizations Has the patient had prior rehab or hospitalizations prior to admission? Yes   Has the patient had major surgery during 100 days prior to admission? Yes              Current Medications   Current Facility-Administered Medications:  .  0.45 % sodium chloride infusion, , Intravenous, Continuous PRN, Skeet Latch, MD, Stopped at 04/18/20 1418 .  0.9 %  sodium chloride infusion, 250 mL, Intravenous, Continuous,  Skeet Latch, MD .  0.9 %  sodium chloride infusion, 250 mL, Intravenous, PRN, Skeet Latch, MD, Last Rate: 10 mL/hr at 04/28/20 2346, 500 mL at 04/28/20 2346 .  amiodarone (PACERONE) tablet 400 mg, 400 mg, Oral, BID, Barrett, Erin R, PA-C, 400 mg at 04/30/20 0834 .  ampicillin (OMNIPEN) 2 g in sodium chloride 0.9 % 100 mL IVPB, 2 g, Intravenous, Q4H, Skeet Latch, MD, Last Rate: 300 mL/hr at 04/30/20 0842, 2 g at 04/30/20 0842 .  apixaban (ELIQUIS) tablet 5 mg, 5 mg, Oral, BID, Skeet Latch, MD, 5 mg at 04/30/20 7078 .  ascorbic acid (VITAMIN C) tablet 500 mg, 500 mg, Oral, Daily, Skeet Latch, MD, 500 mg at 04/30/20 6754 .  aspirin EC tablet 81 mg, 81 mg, Oral, Daily, Skeet Latch, MD, 81 mg at 04/30/20 0823 .  bisacodyl (DULCOLAX) EC tablet 10 mg, 10 mg, Oral, Daily, 10 mg at 04/30/20 0829 **OR** bisacodyl (DULCOLAX) suppository 10 mg, 10 mg, Rectal, Daily, Skeet Latch, MD, 10 mg at 04/22/20 0915 .  cefTRIAXone (ROCEPHIN) 2 g in sodium chloride 0.9 % 100 mL IVPB, 2 g, Intravenous, Q12H, Skeet Latch, MD, Last Rate: 200 mL/hr at 04/30/20 1057, 2 g at 04/30/20 1057 .  Chlorhexidine Gluconate Cloth 2 % PADS 6 each, 6 each, Topical, Daily, Skeet Latch, MD, 6 each at 04/30/20 0847 .  cyclobenzaprine (FLEXERIL) tablet 5 mg, 5 mg, Oral, TID PRN, Skeet Latch, MD, 5 mg at 04/24/20 1730 .  dextrose 50 % solution 0-50 mL, 0-50 mL, Intravenous, PRN, Skeet Latch, MD .  diphenhydrAMINE (BENADRYL) capsule 25 mg, 25 mg, Oral, QHS, Skeet Latch, MD, 25 mg at 04/29/20 2258 .  docusate sodium (COLACE) capsule 200 mg, 200 mg, Oral, Daily, Skeet Latch, MD, 200 mg at 04/30/20 0824 .  feeding supplement (ENSURE ENLIVE) (ENSURE ENLIVE) liquid 237 mL, 237 mL, Oral, TID BM, Skeet Latch, MD, 237 mL at 04/30/20 0847 .  ferrous [id number] C-folic acid (TRINSICON / FOLTRIN) capsule 1 capsule, 1 capsule, Oral, BID PC, Skeet Latch, MD, 1  capsule at 04/30/20 0821 .  CBG monitoring, , , 4x Daily, AC & HS **AND** insulin aspart (novoLOG) injection 0-24 Units, 0-24 Units, Subcutaneous, TID WC, Skeet Latch, MD, 2 Units at 04/30/20 [phone] .  insulin detemir (LEVEMIR) injection 8 Units, 8 Units, Subcutaneous, Daily, Skeet Latch, MD, 8 Units at 04/30/20 [phone] .  lactated ringers infusion, , Intravenous, Continuous, Skeet Latch, MD, Stopped at 04/16/20 2018 .  levothyroxine (SYNTHROID) tablet 25 mcg, 25 mcg, Oral, QAC breakfast, Skeet Latch, MD, 25 mcg at 04/30/20 [phone] .  metoprolol tartrate (LOPRESSOR) tablet  25 mg, 25 mg, Oral, BID, Skeet Latch, MD, 25 mg at 04/30/20 3382 .  morphine 2 MG/ML injection 1-2 mg, 1-2 mg, Intravenous, Q6H PRN, Skeet Latch, MD, 1 mg at 04/29/20 1521 .  ondansetron (ZOFRAN) injection 4 mg, 4 mg, Intravenous, Q6H PRN, Skeet Latch, MD, 4 mg at 04/18/20 1333 .  oxyCODONE (OXYCONTIN) 12 hr tablet 10 mg, 10 mg, Oral, Q12H, Skeet Latch, MD, 10 mg at 04/30/20 5053 .  pantoprazole (PROTONIX) EC tablet 40 mg, 40 mg, Oral, Daily, Skeet Latch, MD, 40 mg at 04/30/20 9767 .  polyethylene glycol (MIRALAX / GLYCOLAX) packet 17 g, 17 g, Oral, Daily, Skeet Latch, MD, 17 g at 04/30/20 0849 .  pregabalin (LYRICA) capsule 100 mg, 100 mg, Oral, TID, Skeet Latch, MD, 100 mg at 04/30/20 0823 .  sodium chloride flush (NS) 0.9 % injection 10-40 mL, 10-40 mL, Intracatheter, Q12H, Skeet Latch, MD, 10 mL at 04/30/20 0847 .  sodium chloride flush (NS) 0.9 % injection 10-40 mL, 10-40 mL, Intracatheter, PRN, Skeet Latch, MD .  sodium chloride flush (NS) 0.9 % injection 3 mL, 3 mL, Intravenous, Q12H, Skeet Latch, MD, 3 mL at 04/30/20 0848 .  sodium chloride flush (NS) 0.9 % injection 3 mL, 3 mL, Intravenous, PRN, Skeet Latch, MD .  traMADol Veatrice [place]) tablet 50 mg, 50 mg, Oral, Q4H PRN, Skeet Latch, MD, 50 mg at 04/28/20 1553   Patients Current Diet:      Diet Order                      Diet regular Room service appropriate? Yes with Assist; Fluid consistency: Thin  Diet effective now                      Precautions / Restrictions Precautions Precautions: Sternal, Fall, Cervical Precaution Comments: ACDF on 4/12, pt reports they d/c-ed collar 1 week after surgery Spinal Brace: Lumbar corset Other Brace: for comfort, pt reports "it doesn't really help me" so we didn't use back brace Restrictions Weight Bearing Restrictions: Yes Other Position/Activity Restrictions: sternal precautions    Has the patient had 2 or more falls or a fall with injury in the past year? No   Prior Activity Level Community (5-7x/wk): up until ACDF in April was independent, has been significant limited by pain in the last 4 weeks or so   Prior Functional Level Self Care: Did the patient need help bathing, dressing, using the toilet or eating? Independent   Indoor Mobility: Did the patient need assistance with walking from room to room (with or without device)? Independent   Stairs: Did the patient need assistance with internal or external stairs (with or without device)? Independent   Functional Cognition: Did the patient need help planning regular tasks such as shopping or remembering to take medications? [place] / Equipment Home Assistive Devices/Equipment: Cane (specify quad or straight), Environmental consultant (specify type) Home Equipment: Grab bars - tub/shower, Shower seat - built in, [place] - single point, Environmental consultant - 2 wheels   Prior Device Use: Indicate devices/aids used by the patient prior to current illness, exacerbation or injury? None of the above   Current Functional Level Cognition   Overall Cognitive Status: Within Functional Limits for tasks assessed Orientation Level: Oriented X4 General Comments: pt requiring max encouragement to participate due to pain but did participate    Extremity Assessment (includes  Sensation/Coordination)   Upper Extremity Assessment: Generalized weakness  Lower Extremity  Assessment: Defer to PT evaluation     ADLs   Overall ADL's : Needs assistance/impaired Grooming: Set up, Sitting Upper Body Bathing: Set up, Sitting Lower Body Bathing: Maximal assistance, Bed level Upper Body Dressing : Minimal assistance, Sitting Lower Body Dressing: Maximal assistance, Bed level Toilet Transfer:  (unable to complete this session due to pain) Toileting- Clothing Manipulation and Hygiene: Maximal assistance Functional mobility during ADLs: +2 for physical assistance, Moderate assistance General ADL Comments: Session focus on transfers OOB, greatly limited by pain in session     Mobility   Overal bed mobility: Needs Assistance Bed Mobility: Rolling, Sidelying to Sit, Sit to Sidelying Rolling: Min guard Sidelying to sit: Min assist Sit to supine: Mod assist Sit to sidelying: Mod assist General bed mobility comments: HOB elevated, used bed rail to roll, minA for trunk elevation wtih HOB elevated, modA for LE management back into bed     Transfers   Overall transfer level: Needs assistance Equipment used: Rolling walker (2 wheeled) Transfer via Lift Equipment: Stedy Transfers: Sit to/from Stand Sit to Stand: Mod assist, From elevated surface  Lateral/Scoot Transfers: Max assist General transfer comment: bed elevated very high and still required modA to power up and steady during transition of hands from bed to RW     Ambulation / Gait / Stairs / Wheelchair Mobility   Ambulation/Gait Ambulation/Gait assistance: Min assist, Mod assist Gait Distance (Feet): 8 Feet (x2) Assistive device: Rolling walker (2 wheeled) Gait Pattern/deviations: Step-to pattern, Decreased stride length, Trunk flexed General Gait Details: short, shuffled steps with progressive trunk flexion, cords got caught up and pt had to sit down on BSC due to pain and inability to stand while PT got cords  untangled Gait velocity: very slow Gait velocity interpretation: <1.8 ft/sec, indicate of risk for recurrent falls     Posture / Balance Dynamic Sitting Balance Sitting balance - Comments: Pt able to sit EOB without assistance for balance for short duration due to pain Balance Overall balance assessment: Needs assistance Sitting-balance support: Feet supported, Single extremity supported Sitting balance-Leahy Scale: Fair Sitting balance - Comments: Pt able to sit EOB without assistance for balance for short duration due to pain Postural control: Posterior lean Standing balance support: During functional activity, Bilateral upper extremity supported Standing balance-Leahy Scale: Poor Standing balance comment: dependent on RW     Special needs/care consideration Skin sternotomy, Diabetic management yes, Special service needs IV abx and Designated visitor wife, [place] (from acute therapy documentation) Living Arrangements: Spouse/significant other  Lives With: Spouse Available Help at Discharge: Family, Available 24 hours/day Type of Home: House Home Layout: One level Home Access: Stairs to enter Entrance Stairs-Rails: Can reach both Entrance Stairs-Number of Steps: 7 Bathroom Shower/Tub: Multimedia programmer: Handicapped height Bathroom Accessibility: Yes How Accessible: Accessible via walker [place]: No Additional Comments: Grab bars in shower with built in seat, no grab bars near toilet   Discharge Living Setting Plans for Discharge Living Setting: Patient's home Type of Home at Discharge: House Discharge Home Layout: One level Discharge Home Access: Stairs to enter Entrance Stairs-Rails: Right, Left Entrance Stairs-Number of Steps: 7 Discharge Bathroom Shower/Tub: Walk-in shower, Tub only Discharge Bathroom Toilet: Standard Discharge Bathroom Accessibility: Yes How Accessible: Accessible via walker Does the patient have  any problems obtaining your medications?: No   Social/Family/Support Systems Patient Roles: Spouse Anticipated Caregiver: Wife- intends to take FMLA Anticipated Caregiver's Contact Information: [name] [phone] Ability/Limitations of Caregiver: None reported  Caregiver Availability: 24/7 Discharge Plan Discussed with Primary Caregiver: Yes Is Caregiver In Agreement with Plan?: Yes Does Caregiver/Family have Issues with Lodging/Transportation while Pt is in Rehab?: No   Goals Patient/Family Goal for Rehab: PT/OT supervision to mod I, SLP n/a Expected length of stay: 14-18 days Pt/Family Agrees to Admission and willing to participate: Yes Program Orientation Provided & Reviewed with Pt/Caregiver Including Roles  & Responsibilities: Yes   Decrease burden of Care through IP rehab admission: n/a   Possible need for SNF placement upon discharge: Not anticipated   Patient Condition: I have reviewed medical records from [facility], spoken with CM, and patient and spouse. I met with patient at the bedside for inpatient rehabilitation assessment.  Patient will benefit from ongoing PT and OT, can actively participate in 3 hours of therapy a day 5 days of the week, and can make measurable gains during the admission.  Patient will also benefit from the coordinated team approach during an Inpatient Acute Rehabilitation admission.  The patient will receive intensive therapy as well as Rehabilitation physician, nursing, social worker, and care management interventions.  Due to safety, skin/wound care, disease management, medication administration, pain management and patient education the patient requires 24 hour a day rehabilitation nursing.  The patient is currently max to total with mobility and basic ADLs.  Discharge setting and therapy post discharge at home with home health is anticipated.  Patient has agreed to participate in the Acute Inpatient Rehabilitation Program and will admit today.    Preadmission Screen Completed By:  Michel Santee, PT, DPT 04/30/2020 11:21 AM ______________________________________________________________________   Discussed status with Dr. Dagoberto Ligas on 04/30/20  at 11:21 AM  and received approval for admission today.   Admission Coordinator:  Michel Santee, PT, DPT time 11:21 AM Sudie Grumbling 04/30/20     Assessment/Plan: Diagnosis:  Debility after AVR, infected prosthetic valve 1. Does the need for close, 24 hr/day Medical supervision in concert with the patient's rehab needs make it unreasonable for this patient to be served in a less intensive setting? Yes 2. Co-Morbidities requiring supervision/potential complications: DM, PAF, HTN, L4-5 discitis on IV abx 3. Due to bladder management, bowel management, safety, skin/wound care, disease management, medication administration, pain management and patient education, does the patient require 24 hr/day rehab nursing? Yes 4. Does the patient require coordinated care of a physician, rehab nurse, PT, OT, and SLP to address physical and functional deficits in the context of the above medical diagnosis(es)? Yes Addressing deficits in the following areas: balance, endurance, locomotion, strength, transferring, bowel/bladder control, bathing, dressing, feeding, grooming, toileting and psychosocial support 5. Can the patient actively participate in an intensive therapy program of at least 3 hrs of therapy 5 days a week? Yes 6. The potential for patient to make measurable gains while on inpatient rehab is good 7. Anticipated functional outcomes upon discharge from inpatient rehab: supervision PT, supervision OT, n/a SLP 8. Estimated rehab length of stay to reach the above functional goals is: 14-18d 9. Anticipated discharge destination: Home 10. Overall Rehab/Functional Prognosis: good     MD Signature:

## 2020-04-30 NOTE — Progress Notes (Addendum)
Subjective:  Patient today complaining of mild knee pain, left worse than right, and feels his knees are more swollen than they have been recently. Denies any paraesthesias but continues to have severe back pain which limits mobility. He is very motivated to start therapy and that he will not be able to progress quickly.   Objective:  PE: VITALS:   Vitals:   04/30/20 1350  BP: (!) 107/55  Pulse: 65  Resp: 17  Temp: 98.3 F (36.8 C)  TempSrc: Oral  SpO2: 100%  Weight: 81.1 kg  Height: 5\' 10"  (1.778 m)   General: Laying in bed, in no acute distress Resp: no use of accessory musculature MSK: EHL and FHL intact bilaterally. Distal sensation intact bilaterally. + DP pulses bilterally. Patient continues to have same amount of edema around both distal femurs, however he has new edema located medially at the joint line bilaterally, please see photos below. Knees are not red or warm. No JLT bilaterally.       LABS  Results for orders placed or performed during the hospital encounter of 04/30/20 (from the past 24 hour(s))  Glucose, capillary     Status: Abnormal   Collection Time: 04/30/20  4:40 PM  Result Value Ref Range   Glucose-Capillary 182 (H) 70 - 99 mg/dL    ECHO TEE  Result Date: 04/29/2020    TRANSESOPHOGEAL ECHO REPORT   Patient Name:   Johnathan Murray Fahy Date of Exam: 04/29/2020 Medical Rec #:  [id number]           Height:       70.0 in Accession #:    [id number]          Weight:       177.7 lb Date of Birth:  09-Sep-1951           BSA:          1.985 m Patient Age:    69 years            BP:           76/37 mmHg Patient Gender: M                   HR:           68 bpm. Exam Location:  Inpatient Procedure: Transesophageal Echo, Cardiac Doppler and Color Doppler Indications:     Atrial fibrillation 427.31 / I48.91  History:         Patient has prior history of Echocardiogram examinations, most                  recent 04/16/2020. CAD, Aortic Valve Disease, Arrythmias:Atrial                   Fibrillation and Atrial Flutter, Signs/Symptoms:Bacteremia;                  Risk Factors:Hypertension, Dyslipidemia and Non-Smoker.                  Aortic Valve: 23 mm Homograft valve/aortic root valve is                  present in the aortic position. Procedure Date: 04/16/2020.  Sonographer:     Vickie Epley RDCS Referring Phys:  [id number] Gaye Pollack Diagnosing Phys: Skeet Latch MD PROCEDURE: After discussion of the risks and benefits of a TEE, an informed consent was obtained from the patient. The transesophogeal probe was passed without  difficulty through the esophogus of the patient. Sedation performed by different physician. The patient was monitored while under deep sedation. Anesthestetic sedation was provided intravenously by Anesthesiology: 317.97mg  of Propofol. Image quality was excellent. The patient's vital signs; including heart rate, blood pressure, and oxygen saturation; remained stable throughout the procedure. The patient developed no complications during the procedure. A successful direct current cardioversion was performed at 200 joules with 1 attempt. IMPRESSIONS  1. Left ventricular ejection fraction, by estimation, is 55 to 60%. The left ventricle has normal function. The left ventricle has no regional wall motion abnormalities.  2. Right ventricular systolic function is mildly reduced. The right ventricular size is normal.  3. No left atrial/left atrial appendage thrombus was detected. The LAA emptying velocity was 50 cm/s.  4. Multiple regurgitant jets each with mild regurgitation. The mitral valve is normal in structure. Mild to moderate mitral valve regurgitation. No evidence of mitral stenosis.  5. Tricuspid valve regurgitation is mild to moderate.  6. The aortic valve has been repaired/replaced. Aortic valve regurgitation is trivial. No aortic stenosis is present. There is a 23 mm Homograft valve/aortic root valve present in the aortic position. Procedure Date:  04/16/2020. Echo findings are consistent with normal structure and function of the aortic valve prosthesis.  7. The inferior vena cava is normal in size with greater than 50% respiratory variability, suggesting right atrial pressure of 3 mmHg. Conclusion(s)/Recommendation(s): Normal biventricular function without evidence of hemodynamically significant valvular heart disease. FINDINGS  Left Ventricle: Left ventricular ejection fraction, by estimation, is 55 to 60%. The left ventricle has normal function. The left ventricle has no regional wall motion abnormalities. The left ventricular internal cavity size was normal in size. There is  no left ventricular hypertrophy. Right Ventricle: The right ventricular size is normal. No increase in right ventricular wall thickness. Right ventricular systolic function is mildly reduced. Left Atrium: Left atrial size was normal in size. No left atrial/left atrial appendage thrombus was detected. The LAA emptying velocity was 50 cm/s. Right Atrium: Right atrial size was normal in size. Pericardium: There is no evidence of pericardial effusion. Mitral Valve: Multiple regurgitant jets each with mild regurgitation. The mitral valve is normal in structure. Normal mobility of the mitral valve leaflets. Mild to moderate mitral valve regurgitation. No evidence of mitral valve stenosis. Tricuspid Valve: The tricuspid valve is normal in structure. Tricuspid valve regurgitation is mild to moderate. No evidence of tricuspid stenosis. Aortic Valve: No perivalvular regurgitation or abscess. The aortic valve has been repaired/replaced. Aortic valve regurgitation is trivial. No aortic stenosis is present. There is a 23 mm Homograft valve/aortic root valve present in the aortic position. Procedure Date: 04/16/2020. Echo findings are consistent with normal structure and function of the aortic valve prosthesis. Pulmonic Valve: The pulmonic valve was normal in structure. Pulmonic valve regurgitation  is trivial. No evidence of pulmonic stenosis. Aorta: The aortic root is normal in size and structure. There is minimal (Grade I) plaque involving the descending aorta. Venous: The inferior vena cava is normal in size with greater than 50% respiratory variability, suggesting right atrial pressure of 3 mmHg. IAS/Shunts: No atrial level shunt detected by color flow Doppler. There is no evidence of a patent foramen ovale. There is no evidence of an atrial septal defect.  MR Peak grad:    75.3 mmHg   TRICUSPID VALVE MR Mean grad:    42.0 mmHg   TR Peak grad:   18.1 mmHg MR Vmax:  434.00 cm/s TR Vmax:        213.00 cm/s MR Vmean:        296.0 cm/s MR PISA:         1.01 cm MR PISA Eff ROA: 9 mm MR PISA Radius:  0.40 cm Skeet Latch MD Electronically signed by Skeet Latch MD Signature Date/Time: 04/29/2020/1:20:20 PM    Final     Assessment/Plan: Active Problems:   Discitis   Bilateral knee pain, septic arthritis s/p bilateral knee irrigation and debridement with new swelling - 5/1 - left knee aspiration performed whichshowedintracellular calcium pyrophosphate crystals withwhite blood cell count approx.15,000consistent with pseudogout - 5/2 - right knee aspirated due to report of right knee pain and swelling, and bilateral knees received cortisone injectiondueto results above - this aspiration showed intracellular calcium pyrophosphate crystals and white blood cell count approx. 19,000 - 5/3 - left knee culturegrewout Enterococcus faecalis, no growth from right knee aspirate  - 5/2 - bilateral knee irrigation and debridementperformed -5/19knee aspirated again due to left knee swelling,only able to aspirate4 ml - finalresults of this aspirationshowed no organisms, white blood cell count approx. 26,000 and intra and extra cellular calcium pyrophosphate crystals - both knees aspirated again today, was able to aspirate 10cc's of fluid from the left knee, was only able to aspirate  1cc of blood from right knee. Will send left knee aspirate for gram stain, culture, cell count + differential w/ crystals - will order MRIs of bilateral knees as well - will await results with possible repeat washout of both knees on Saturday - continue WBAT bilateral knees, continue working with PT  PRE-PROCEDURE DIAGNOSIS:  Bilateral  knee effusion(s) POST-OPERATIVE DIAGNOSIS:  Same PROCEDURE:  Aspiration of bilateral knees  PROCEDURE DETAILS:  After informed verbal consent was obtained the superolateral portal of the left knee was prepped with chlorhexidine. Approximately 3 mL of marcaine was injected superficially and then into the deeper tissues and into the joint space. Medicine was left to sit for several minutes to take effect. Next, a 60 mL syringe with an 18-gauge needle was used to aspirate approximately 10 ml of turbid yellow fluid. The same procedure was performed on the right knee, however no aspirate was able to be obtained from the right knee. He tolerated this well without complication and a Band-Aid was placed.   Contact information:   Weekdays [address], [place] [phone] A fter hours and holidays please check [email or web address] for group call information for [facility]  Ventura Bruns 04/30/2020, 5:39 PM

## 2020-04-30 NOTE — Progress Notes (Signed)
Inpatient Rehab Admissions Coordinator:   I have a bed available for pt to admit to CIR today.  CTS in agreement.  Will let pt/family and TOC team know.   Shann Medal, PT, DPT Admissions Coordinator [phone] 04/30/20  11:19 AM

## 2020-04-30 NOTE — Care Management Important Message (Signed)
Important Message  Patient Details  Name: Johnathan Murray MRN: [id number] Date of Birth: 11/10/1951   Medicare Important Message Given:  Yes     Shelda Altes 04/30/2020, 10:43 AM

## 2020-04-30 NOTE — Progress Notes (Addendum)
      [address]       [place] [address]             [phone]      1 Day Post-Op Procedure(s) (LRB): TRANSESOPHAGEAL ECHOCARDIOGRAM (TEE) (N/A) CARDIOVERSION (N/A) Subjective: Feels okay this morning, he did admit that his legs "gave out" while trying to walk to the door yesterday.   Objective: Vital signs in last 24 hours: Temp:  [97.6 F (36.4 C)-99.2 F (37.3 C)] 98.5 F (36.9 C) (06/10 0418) Pulse Rate:  [51-98] 71 (06/10 0418) Cardiac Rhythm: Atrial fibrillation (06/09 2027) Resp:  [13-18] 18 (06/10 0418) BP: (76-120)/(34-74) 119/69 (06/10 0418) SpO2:  [94 %-100 %] 98 % (06/10 0418)     Intake/Output from previous day: 06/09 0701 - 06/10 0700 In: -  Out: 600 [Urine:600] Intake/Output this shift: No intake/output data recorded.  General appearance: alert, cooperative and no distress Heart: regular rate and rhythm, S1, S2 normal, no murmur, click, rub or gallop Lungs: clear to auscultation bilaterally Abdomen: soft, non-tender; bowel sounds normal; no masses,  no organomegaly Extremities: extremities normal, atraumatic, no cyanosis or edema and left knee swelling Wound: clean and dry sternal incision  Lab Results: Recent Labs    04/29/20 0500  WBC 7.8  HGB 8.3*  HCT 27.1*  PLT 328   BMET:  Recent Labs    04/29/20 0500  NA 137  K 3.7  CL 102  CO2 25  GLUCOSE 121*  BUN 16  CREATININE 1.28*  CALCIUM 8.7*    PT/INR: No results for input(s): LABPROT, INR in the last 72 hours. ABG    Component Value Date/Time   PHART 7.409 04/16/2020 2343   HCO3 25.0 04/16/2020 2343   TCO2 26 04/16/2020 2343   O2SAT 96.0 04/16/2020 2343   CBG (last 3)  Recent Labs    04/29/20 1654 04/29/20 2051 04/30/20 0612  GLUCAP 155* 178* 142*    Assessment/Plan: S/P Procedure(s) (LRB): TRANSESOPHAGEAL ECHOCARDIOGRAM (TEE) (N/A) CARDIOVERSION (N/A)    1. CV-DCCV yesterday. In and out of rate-controlled afib this morning. Rate in the 70s.  BP well controlled.  2. Pulm- tolerating room air with good oxygen saturation 3. Renal-creatinine 1.28, electrolytes okay 4. H and h 8.3/27.1, expected acute blood loss anemia 5. Endo-blood glucose well controlled.  6. ID-on Ampicillin and Ceftriaxone for disseminated Enterococcus Faecalis 7.Spinal osteo/discitis with small psoas abscesses and probable myositis in paraspinous muscles related to enterococcal bacteremia 8. Septic bilateral knees previously drained and irrigated. Patient is requesting for ortho to take another look.  Plan: Hopefully to rehab today.    LOS: 17 days    Chart reviewed, patient examined, agree with above. Continue oral amio and lopressor. I think he can go to CIR.  Elgie Collard 04/30/2020

## 2020-04-30 NOTE — Progress Notes (Signed)
Chest tube sutures removed per MD order without difficulty.  Will continue to monitor.

## 2020-05-01 ENCOUNTER — Inpatient Hospital Stay (HOSPITAL_COMMUNITY): Payer: BC Managed Care – PPO

## 2020-05-01 ENCOUNTER — Inpatient Hospital Stay (HOSPITAL_COMMUNITY): Payer: BC Managed Care – PPO | Admitting: Physical Therapy

## 2020-05-01 ENCOUNTER — Telehealth (HOSPITAL_COMMUNITY): Payer: Self-pay

## 2020-05-01 ENCOUNTER — Telehealth: Payer: Self-pay

## 2020-05-01 ENCOUNTER — Inpatient Hospital Stay (HOSPITAL_COMMUNITY): Payer: BC Managed Care – PPO | Admitting: Occupational Therapy

## 2020-05-01 ENCOUNTER — Other Ambulatory Visit: Payer: Self-pay

## 2020-05-01 DIAGNOSIS — M00869 Arthritis due to other bacteria, unspecified knee: Secondary | ICD-10-CM

## 2020-05-01 LAB — CBC WITH DIFFERENTIAL/PLATELET
Abs Immature Granulocytes: 0.05 10*3/uL (ref 0.00–0.07)
Basophils Absolute: 0.1 10*3/uL (ref 0.0–0.1)
Basophils Relative: 1 %
Eosinophils Absolute: 0.3 10*3/uL (ref 0.0–0.5)
Eosinophils Relative: 4 %
HCT: 24.7 % — ABNORMAL LOW (ref 39.0–52.0)
Hemoglobin: 7.4 g/dL — ABNORMAL LOW (ref 13.0–17.0)
Immature Granulocytes: 1 %
Lymphocytes Relative: 15 %
Lymphs Abs: 1 10*3/uL (ref 0.7–4.0)
MCH: 28.9 pg (ref 26.0–34.0)
MCHC: 30 g/dL (ref 30.0–36.0)
MCV: 96.5 fL (ref 80.0–100.0)
Monocytes Absolute: 0.6 10*3/uL (ref 0.1–1.0)
Monocytes Relative: 8 %
Neutro Abs: 4.8 10*3/uL (ref 1.7–7.7)
Neutrophils Relative %: 71 %
Platelets: 283 10*3/uL (ref 150–400)
RBC: 2.56 MIL/uL — ABNORMAL LOW (ref 4.22–5.81)
RDW: 15 % (ref 11.5–15.5)
WBC: 6.8 10*3/uL (ref 4.0–10.5)
nRBC: 0 % (ref 0.0–0.2)

## 2020-05-01 LAB — COMPREHENSIVE METABOLIC PANEL
ALT: 58 U/L — ABNORMAL HIGH (ref 0–44)
AST: 44 U/L — ABNORMAL HIGH (ref 15–41)
Albumin: 2 g/dL — ABNORMAL LOW (ref 3.5–5.0)
Alkaline Phosphatase: 126 U/L (ref 38–126)
Anion gap: 6 (ref 5–15)
BUN: 15 mg/dL (ref 8–23)
CO2: 27 mmol/L (ref 22–32)
Calcium: 8.7 mg/dL — ABNORMAL LOW (ref 8.9–10.3)
Chloride: 102 mmol/L (ref 98–111)
Creatinine, Ser: 1.25 mg/dL — ABNORMAL HIGH (ref 0.61–1.24)
GFR calc Af Amer: >60 mL/min (ref >60)
GFR calc non Af Amer: 59 mL/min — ABNORMAL LOW (ref >60)
Glucose, Bld: 102 mg/dL — ABNORMAL HIGH (ref 70–99)
Potassium: 4 mmol/L (ref 3.5–5.1)
Sodium: 135 mmol/L (ref 135–145)
Total Bilirubin: 0.8 mg/dL (ref 0.3–1.2)
Total Protein: 5.7 g/dL — ABNORMAL LOW (ref 6.5–8.1)

## 2020-05-01 LAB — GLUCOSE, CAPILLARY
Glucose-Capillary: 109 mg/dL — ABNORMAL HIGH (ref 70–99)
Glucose-Capillary: 130 mg/dL — ABNORMAL HIGH (ref 70–99)
Glucose-Capillary: 153 mg/dL — ABNORMAL HIGH (ref 70–99)
Glucose-Capillary: 180 mg/dL — ABNORMAL HIGH (ref 70–99)

## 2020-05-01 MED ORDER — SODIUM CHLORIDE 0.9% FLUSH
10.0000 mL | Freq: Two times a day (BID) | INTRAVENOUS | Status: DC
  Administered 2020-05-01: 10 mL

## 2020-05-01 MED ORDER — CHLORHEXIDINE GLUCONATE CLOTH 2 % EX PADS
6.0000 | MEDICATED_PAD | Freq: Every day | CUTANEOUS | Status: DC
  Administered 2020-05-01: 6 via TOPICAL

## 2020-05-01 MED ORDER — SODIUM CHLORIDE 0.9% FLUSH: 10.0000 mL | INTRAVENOUS | Status: DC | PRN

## 2020-05-01 NOTE — Progress Notes (Signed)
Informed consent was signed by patient for procedure tomorrow and place in chart. Patient does not have any questions or concerns at this moment. Amanda Cockayne, LPN

## 2020-05-01 NOTE — Progress Notes (Signed)
Patient ID: Johnathan Murray, male   DOB: [birth year], 69 y.o.   MRN: [id number]   SW familiar with pt. Refer to assessment completed on 5/19. Per EMR, Pt admitted to CIR on 5/18  For further management. Pt was d/c to acute services after follow-up TEE  was completed findings of thoracic root abscess. Patient underwent redo sternotomy with ascending aortic root replacement 04/16/2020 per Dr. Cyndia Bent.  His Eliquis has been resumed.  Patient with persistent bouts of A. fib with RVR undergoing TEE/DCCV 04/29/2020 per cardiology services and was initially placed on IV amiodarone and transition to p.o. amiodarone 200 mg twice daily as well as low-dose beta-blocker and close monitoring of heart rate.  Ceftriaxone ampicillin ongoing again followed by infectious disease through 05/20/2020.  *SW met with pt and pt wife in room to welcome back to rehab. SW made copies of Living will/Medical Directive/Durable POA and place in pt chart. Pt and pt wife had questions about procedure tomorrow and concerns. SW relayed to medical staff and encouraged to follow-up with orthopedics.   SW spoke with Triage at [facility] [phone]) at pt request for Dr. Sharol Given or a staff member to follow-up about questions/concerns he has about the nerve block being placed and would like to discuss the procedure. Reports pt will have a washout and nerve block placed. They state they will follow-up with the patient. SW informed that his wife was here at the hospital and to call her as well to discuss further.   Loralee Pacas, MSW, [place] Office: [phone] Cell: [phone] Fax: [phone]

## 2020-05-01 NOTE — Telephone Encounter (Signed)
Faxed referral to [facility] per [facility]

## 2020-05-01 NOTE — Telephone Encounter (Signed)
Patients wife called wanting a call back. He has some questions on his surgery tomorrow and how he will be put to sleep

## 2020-05-01 NOTE — Progress Notes (Signed)
Inpatient Rehabilitation Medication Review by a Pharmacist  A complete drug regimen review was completed for this patient to identify any potential clinically significant medication issues.  Clinically significant medication issues were identified:  no   Thank you Anette Guarneri, PharmD

## 2020-05-01 NOTE — Progress Notes (Signed)
I have spoken with Dr. Sharol Given, who recommends open synovectomy and washout.  He will plan for surgery in am.   Npo after midnight.  Will plan to readmit to rehab after surgery, with nerve blocks for post op pain control, restarting therapy wbat post op.   Will update the patient.   Johnny Bridge, MD

## 2020-05-01 NOTE — Evaluation (Signed)
Occupational Therapy Assessment and Plan  Patient Details  Name: Akiel Fennell MRN: [id number] Date of Birth: 1951/06/23  OT Diagnosis: abnormal posture, acute pain and muscle weakness (generalized) Rehab Potential: Rehab Potential (ACUTE ONLY): Good ELOS: 2-2.5 wks   Today's Date: 05/01/2020 OT Individual Time: 1000-1110 OT Individual Time Calculation (min): 70 min     Problem List:  Patient Active Problem List   Diagnosis Date Noted  . Persistent atrial fibrillation ([place])   . Prosthetic valve endocarditis ([place]) 04/13/2020  . Aortic valve abscess 04/13/2020  . Endocarditis 04/13/2020  . Typical atrial flutter ([place])   . Discitis 04/07/2020  . Protein-calorie malnutrition, severe 03/24/2020  . Septic arthritis ([place]) 03/23/2020  . History of fusion of cervical spine 03/23/2020  . Bacteremia due to Enterococcus 03/21/2020  . Pseudogout of left knee 03/21/2020  . ARF (acute renal failure) ([place]) 03/20/2020  . S/P AVR 01/17/2020  . Renal insufficiency 01/17/2020  . Pre-operative cardiovascular examination 01/03/2020  . Acute otitis externa of left ear 11/13/2019  . Acute suppurative otitis media of left ear without spontaneous rupture of tympanic membrane 11/13/2019  . Conductive hearing loss of left ear 11/13/2019  . Tinnitus of left ear 11/13/2019  . Lumbar stenosis with neurogenic claudication 06/11/2019  . Cervical myelopathy ([place]) 04/22/2019  . Preoperative cardiovascular examination 04/17/2019  . Intermittent claudication ([place]) 10/31/2018  . Coronary artery disease of native artery of native heart with stable angina pectoris ([place])   . Abnormal nuclear stress test 05/03/2018  . Aortic stenosis 05/03/2018  . Acid reflux 02/15/2018  . Arthritis 02/15/2018  . Hyperactive gag reflex 02/15/2018  . Macular degeneration 02/15/2018  . Abnormal LFTs (liver function tests) 10/31/2016  . Coronary artery disease involving native coronary artery of native heart without angina  pectoris 05/30/2016  . Diabetes mellitus due to underlying condition with unspecified complications ([place]) 12/45/8099  . Dyslipidemia 05/30/2016  . Essential hypertension 05/30/2016  . PAF (paroxysmal atrial fibrillation) ([place]) 05/30/2016    Past Medical History:  Past Medical History:  Diagnosis Date  . Abnormal LFTs (liver function tests) 10/31/2016  . Abnormal nuclear stress test 05/03/2018  . Acid reflux 02/15/2018   not at present  . Acute otitis externa of left ear 11/13/2019  . Acute suppurative otitis media of left ear without spontaneous rupture of tympanic membrane 11/13/2019  . Anemia    low iron  . Aortic stenosis 05/03/2018  . Aortic valve abscess 04/13/2020  . ARF (acute renal failure) ([address]) 03/20/2020  . Arthritis 02/15/2018   Overview:  shoulder  . Bacteremia due to Enterococcus 03/21/2020  . Cervical myelopathy ([place]) 04/22/2019  . Conductive hearing loss of left ear 11/13/2019  . Coronary artery disease involving native coronary artery of native heart without angina pectoris 05/30/2016  . Coronary artery disease of native artery of native heart with stable angina pectoris ([place])    pt. denies  . Diabetes mellitus due to underlying condition with unspecified complications ([place]) 8/33/8250  . Discitis 04/07/2020  . Dyslipidemia 05/30/2016  . Dyspnea    upon exertion,  as of 06/05/09 not having any recent chest pain  . Dysrhythmia    a fib  . Endocarditis 04/13/2020  . Essential hypertension 05/30/2016  . Heart murmur    Has had an Aortic valve replacement  . History of fusion of cervical spine 03/23/2020  . Hyperactive gag reflex 02/15/2018  . Hypothyroidism   . Intermittent claudication ([place]) 10/31/2018  . Lumbar stenosis with neurogenic claudication 06/11/2019  . Macular  degeneration 02/15/2018  . PAF (paroxysmal atrial fibrillation) ([place]) 05/30/2016  . Pre-operative cardiovascular examination 01/03/2020  . Preoperative cardiovascular examination 04/17/2019  . Prosthetic valve  endocarditis ([place]) 04/13/2020  . Protein-calorie malnutrition, severe 03/24/2020  . Pseudogout of left knee 03/21/2020  . Renal insufficiency 01/17/2020  . S/P aortic valve replacement 01/17/2020  . S/P AVR 01/17/2020  . Septic arthritis ([place]) 03/23/2020  . Tinnitus of left ear 11/13/2019  . Typical atrial flutter [facility])    Past Surgical History:  Past Surgical History:  Procedure Laterality Date  . ANTERIOR CERVICAL DECOMP/DISCECTOMY FUSION N/A 04/22/2019   Procedure: Anterior Cervical Discectomy and Fusion, Cervical four-five, Cervical five-six;  Surgeon: Earnie Larsson, MD;  Location: [place];  Service: Neurosurgery;  Laterality: N/A;  . ANTERIOR CERVICAL DECOMP/DISCECTOMY FUSION N/A 03/02/2020   Procedure: Anterior Cervical Decompression Fusion  - Cervical three-Cervical four;  Surgeon: Earnie Larsson, MD;  Location: [place];  Service: Neurosurgery;  Laterality: N/A;  . AORTIC VALVE REPLACEMENT    . ASCENDING AORTIC ROOT REPLACEMENT N/A 04/16/2020   Procedure: ASCENDING AORTIC ROOT REPLACEMENT - HOMOGRAFT using [place] 23 MM Cryo Aortic Valve.;  Surgeon: Gaye Pollack, MD;  Location: [facility] OR;  Service: Open Heart Surgery;  Laterality: N/A;  . CARDIAC CATHETERIZATION     several stents  . CARDIAC SURGERY    . CARDIOVERSION N/A 04/13/2020   Procedure: CARDIOVERSION;  Surgeon: Geralynn Rile, MD;  Location: [place];  Service: Cardiovascular;  Laterality: N/A;  . CARDIOVERSION N/A 04/29/2020   Procedure: CARDIOVERSION;  Surgeon: Skeet Latch, MD;  Location: [facility] ENDOSCOPY;  Service: Cardiovascular;  Laterality: N/A;  . CORONARY ARTERY BYPASS GRAFT  2017   patient unsure how many, had at [facility]  . HAND SURGERY    . IR LUMBAR [place] W/IMG GUIDE  04/07/2020  . IRRIGATION AND DEBRIDEMENT KNEE Bilateral 03/24/2020   Procedure: ARTHROSCOPY IRRIGATION AND DEBRIDEMENT BILATERAL  KNEE;  Surgeon: Marchia Bond, MD;  Location: [place];  Service: Orthopedics;  Laterality:  Bilateral;  . KNEE SURGERY    . LEFT HEART CATH AND CORONARY ANGIOGRAPHY N/A 01/21/2020   Procedure: LEFT HEART CATH AND CORONARY ANGIOGRAPHY;  Surgeon: Belva Crome, MD;  Location: [place] CV LAB;  Service: Cardiovascular;  Laterality: N/A;  . LEFT HEART CATH AND CORS/GRAFTS ANGIOGRAPHY N/A 05/16/2018   Procedure: LEFT HEART CATH AND CORS/GRAFTS ANGIOGRAPHY;  Surgeon: Burnell Blanks, MD;  Location: [place] CV LAB;  Service: Cardiovascular;  Laterality: N/A;  . LUMBAR LAMINECTOMY/DECOMPRESSION MICRODISCECTOMY Bilateral 06/11/2019   Procedure: Bilateral Lumbar Four-FiveLaminectomy/Foraminotomy;  Surgeon: Earnie Larsson, MD;  Location: [place];  Service: Neurosurgery;  Laterality: Bilateral;  posterior  . TEE WITHOUT CARDIOVERSION N/A 03/26/2020   Procedure: TRANSESOPHAGEAL ECHOCARDIOGRAM (TEE);  Surgeon: Jerline Pain, MD;  Location: [facility] ENDOSCOPY;  Service: Cardiovascular;  Laterality: N/A;  . TEE WITHOUT CARDIOVERSION N/A 04/13/2020   Procedure: TRANSESOPHAGEAL ECHOCARDIOGRAM (TEE);  Surgeon: Geralynn Rile, MD;  Location: [place];  Service: Cardiovascular;  Laterality: N/A;  . TEE WITHOUT CARDIOVERSION N/A 04/16/2020   Procedure: TRANSESOPHAGEAL ECHOCARDIOGRAM (TEE);  Surgeon: Gaye Pollack, MD;  Location: [place];  Service: Open Heart Surgery;  Laterality: N/A;  . TEE WITHOUT CARDIOVERSION N/A 04/29/2020   Procedure: TRANSESOPHAGEAL ECHOCARDIOGRAM (TEE);  Surgeon: Skeet Latch, MD;  Location: [facility] ENDOSCOPY;  Service: Cardiovascular;  Laterality: N/A;    Assessment & Plan Clinical Impression: Patient is a 69 y.o. year old male diabetes mellitus, PAF maintained on amiodarone as well as  Eliquis, hypertension, bioprosthetic aortic valve replacement 2017, lumbar laminectomy decompression 06/11/2019 as well as recent C3-4 anterior cervical discectomy with interbody fusion for cervical stenosis myelopathy 03/02/2020 per [name].  Per chart review lives with spouse.  1 level home 7  steps to entry.  He was using a rolling walker prior to admission.  Wife works during the day.  Presented 03/19/2020 with low-grade fever and weakness of lower extremities.  Lactic acid 1.6, sodium 130, BUN 98, creatinine 6.40 with latest prior creatinine 1.6345 2021, WBC 12,600, hemoglobin 10.4, CK 20, urinalysis showed RBCs of 21-50 WBC 21-50 protein around 100 bacteria rare hyaline cast, SARS coronavirus negative.  His LFTs were markedly elevated AST 415 ALT 429 total bilirubin 5.5.  CT abdomen pelvis as well as CT cervical spine unremarkable.  MRI cervical spine negative for infection.  Patient was hypotensive received fluid bolus.  Placed on broad-spectrum antibiotics for suspected sepsis.  MRI lumbar spine findings consistent with acute osteomyelitis discitis L4-5.  Evidence for associated soft tissue infection cellulitis within the psoas musculature bilaterally with a few small superimposed abscesses.  Additional multilevel degenerative spondylosis most pronounced at L3-4.  Neurosurgery consulted no plan for surgical intervention underwent aspiration of disc space L4-5.  TEE showed no obvious vegetation and follow-up cardiology services.  Orthopedic services consulted Dr. Mardelle Matte for bilateral knee pain with noted swelling of left knee.  X-ray showed diffuse degenerative changes as well as large joint effusion.  Doppler showed ruptured Baker's cyst on the left lower extremity.  Patient underwent left lower extremity joint aspiration 03/21/2020 and right lower extremity joint aspiration 03/22/2020 both showing intracellular calcium pyrophosphate crystals WBC was up to 15,000 culture grew rare E Faecalis.  Patient did receive steroid injection of both knees.  Underwent I&D bilateral knees 03/24/2020.  Infectious disease continue to follow maintained on intravenous ampicillin x7 to 8 weeks through 05/20/2020 PICC line was placed 03/29/2020.  AKI on CKD stage III continue to improve multifactorial from bacteremia with  latest creatinine 1.39.  Normocytic anemia postoperative patient was transfused 2 units packed red blood cells with latest hemoglobin 8.6 and WBC 9400.  Patient was admitted to inpatient rehab services 04/07/2020 for ongoing comprehensive inpatient rehab services.  Slow progressive gains with noted bouts of tachycardia followed by cardiology services.  His amiodarone had since been resumed now that his renal function had improved.  His Eliquis was ongoing as prior to admission.  Patient with bouts of low-grade fever followed by infectious disease.  Ceftriaxone was added to patient's ampicillin for increased coverage.  A follow-up TEE was completed findings of thoracic root abscess and patient was transferred to acute care services for ongoing care 04/13/2020.  Patient underwent redo sternotomy with ascending aortic root replacement 04/16/2020 per Dr. Cyndia Bent.  His Eliquis has been resumed.  Patient with persistent bouts of A. fib with RVR undergoing TEE/DCCV 04/29/2020 per cardiology services that showed,  bioprosthetic aortic valve well-seated without an perivalvular regurgitation.  No LA/LAA thrombus or masses and was initially placed on IV amiodarone and transition to p.o. amiodarone 200 mg twice daily as well as low-dose beta-blocker and close monitoring of heart rate.  Ceftriaxone ampicillin ongoing again followed by infectious disease through 05/20/2020.  Therapy evaluations completed and patient was admitted back to inpatient rehab services for comprehensive therapies..  Patient transferred to CIR on 04/30/2020 .    Patient currently requires min -total A with basic self-care skills and IADL secondary to muscle weakness, decreased cardiorespiratoy endurance and decreased sitting  balance, decreased standing balance and decreased balance strategies.  Prior to hospitalization, patient could complete ADLs and IADLs with independent .  Patient will benefit from skilled intervention to decrease level of assist with  basic self-care skills prior to discharge home with care partner.  Anticipate patient will require 24 hour supervision and follow up outpatient.  OT - End of Session Activity Tolerance: Decreased this session Endurance Deficit: Yes Endurance Deficit Description: Secondary to deconditioning OT Assessment Rehab Potential (ACUTE ONLY): Good OT Barriers to Discharge: Home environment access/layout OT Barriers to Discharge Comments: 8 STE OT Patient demonstrates impairments in the following area(s): Balance;Endurance;Motor;Pain;Safety OT Basic ADL's Functional Problem(s): Bathing;Dressing;Toileting;Grooming OT Transfers Functional Problem(s): Toilet;Tub/Shower OT Additional Impairment(s): None OT Plan OT Intensity: Minimum of 1-2 x/day, 45 to 90 minutes OT Frequency: 5 out of 7 days OT Duration/Estimated Length of Stay: 2-2.5 wks OT Treatment/Interventions: Community reintegration;Discharge planning;DME/adaptive equipment instruction;Functional mobility training;Pain management;Patient/family education;Self Care/advanced ADL retraining;Psychosocial support;Therapeutic Activities;Therapeutic Exercise;UE/LE Strength taining/ROM;UE/LE Coordination activities OT Basic Self-Care Anticipated Outcome(s): S OT Toileting Anticipated Outcome(s): S OT Bathroom Transfers Anticipated Outcome(s): S OT Recommendation Recommendations for Other Services: Neuropsych consult Patient destination: Home Follow Up Recommendations: Home health OT;24 hour supervision/assistance Equipment Recommended: To be determined   Skilled Therapeutic Intervention Upon entering the room, pt supine in bed with wife present in the room. Pt with c/o pain in B knees ( more L knee) and lower back this session. OT educated pt on OT purpose, POC, and goals with pt and wife verbalizing understanding and agreement. Pt performed bed mobility with min A to EOB. Pt unable to sit for long secondary to increasing back pain. Pt able to don UB  clothing with mod A and LB clothing with max A. Pt attempting figure four but unable while seated on EOB. Pt returning to supine and able to do figure four more comfortably in this position. Pt required increased time for mobility this session secondary to increased pain. OT answering questions for pt and caregiver as able. Pt remained in bed at end of session with call bell and all needed items within reach. Pt declined to attempt standing secondary to pain and endurance.   OT Evaluation Precautions/Restrictions  Precautions Precautions: Sternal;Fall Precaution Comments: ACDF on 4/12, pt reports they d/c-ed collar 1 week after surgery Required Braces or Orthoses: Spinal Brace Spinal Brace: Lumbar corset Other Brace: for comfort, pt reports "it doesn't really help me" so we didn't use back brace Restrictions Weight Bearing Restrictions: Yes Other Position/Activity Restrictions: sternal precautions Vital Signs Therapy Vitals Temp: 98.4 F (36.9 C) Pulse Rate: 61 Resp: 16 BP: 102/60 Patient Position (if appropriate): Lying Oxygen Therapy SpO2: 97 % O2 Device: Room Air Pain Pain Assessment Pain Scale: 0-10 Pain Score: 7  Pain Type: Acute pain Pain Location: Back Pain Orientation: Lower Pain Descriptors / Indicators: Aching;Guarding;Dull Pain Onset: On-going Patients Stated Pain Goal: 2 Pain Intervention(s): RN made aware;Repositioned Multiple Pain Sites: No Home Living/Prior Functioning Home Living Family/patient expects to be discharged to:: Private residence Living Arrangements: Spouse/significant other Available Help at Discharge: Family, Available 24 hours/day Type of Home: House Home Access: Stairs to enter Technical brewer of Steps: 8 Entrance Stairs-Rails: Right, Left Home Layout: One level Bathroom Shower/Tub: Multimedia programmer: Handicapped height Bathroom Accessibility: Yes Additional Comments: Grab bars in shower with built in seat, no grab  bars near toilet  Lives With: Spouse IADL History IADL Comments: Wife was responsible for IADLs Prior Function Level of Independence: Independent with basic ADLs, Requires  assistive device for independence, Independent with transfers  Able to Take Stairs?: Yes Driving: Yes Vocation: Full time employment Comments: worked as a Tax inspector prior to [place] Vision/History: Wears glasses Wears Glasses: Reading only Patient Visual Report: No change from baseline Vision Assessment?: No apparent visual deficits Perception  Perception: Within Functional Limits Praxis Praxis: Intact Cognition Overall Cognitive Status: Within Functional Limits for tasks assessed Arousal/Alertness: Awake/alert Orientation Level: Person;Place;Situation Person: Oriented Place: Oriented Situation: Oriented Year: 2021 Month: June Day of Week: Correct Memory: Appears intact Immediate Memory Recall: Sock;Blue;Bed Memory Recall Sock: Without Cue Memory Recall Blue: Without Cue Memory Recall Bed: Without Cue Safety/Judgment: Appears intact Sensation Sensation Light Touch: Impaired by gross assessment Peripheral sensation comments: Some loss of light touch sensation in distal extremities Stereognosis: Not tested Coordination Gross Motor Movements are Fluid and Coordinated: Yes Fine Motor Movements are Fluid and Coordinated: No Motor  Motor Motor: Within Functional Limits Motor - Skilled Clinical Observations: generalized weakness Mobility  Bed Mobility Bed Mobility: Supine to Sit;Sit to Supine Rolling Right: Minimal Assistance - Patient > 75% Rolling Left: Minimal Assistance - Patient > 75% Supine to Sit: Supervision/Verbal cueing Sit to Supine: Minimal Assistance - Patient > 75% Transfers Sit to Stand: Moderate Assistance - Patient 50-74% Stand to Sit: Moderate Assistance - Patient 50-74%  Trunk/Postural Assessment  Cervical Assessment Cervical Assessment:  Exceptions to [facility] (forward head) Thoracic Assessment Thoracic Assessment: Exceptions to [facility] (rounded shoulders) Lumbar Assessment Lumbar Assessment: Exceptions to [facility] (posterior pelvic tilt) Postural Control Postural Control: Within Functional Limits  Balance Balance Balance Assessed: Yes Static Sitting Balance Static Sitting - Balance Support: Feet supported Static Sitting - Level of Assistance: 5: Stand by assistance Dynamic Sitting Balance Dynamic Sitting - Balance Support: Feet supported Dynamic Sitting - Level of Assistance: 4: Min assist Static Standing Balance Static Standing - Balance Support: During functional activity Static Standing - Level of Assistance: 4: Min assist Dynamic Standing Balance Dynamic Standing - Balance Support: Bilateral upper extremity supported Dynamic Standing - Level of Assistance: 4: Min assist Extremity/Trunk Assessment RUE Assessment RUE Assessment: Within Functional Limits Passive Range of Motion (PROM) Comments: WFL Active Range of Motion (AROM) Comments: WFL LUE Assessment LUE Assessment: Within Functional Limits Passive Range of Motion (PROM) Comments: WFL Active Range of Motion (AROM) Comments: WFL     Refer to Care Plan for Long Term Goals  Recommendations for other services: Neuropsych   Discharge Criteria: Patient will be discharged from OT if patient refuses treatment 3 consecutive times without medical reason, if treatment goals not met, if there is a change in medical status, if patient makes no progress towards goals or if patient is discharged from hospital.  The above assessment, treatment plan, treatment alternatives and goals were discussed and mutually agreed upon: by patient  Gypsy Decant 05/01/2020, 5:29 PM

## 2020-05-01 NOTE — Discharge Instructions (Signed)
Inpatient Rehab Discharge Instructions  [facility] Discharge date and time: No discharge date for patient encounter.   Activities/Precautions/ Functional Status: Activity: activity as tolerated Diet: diabetic diet Wound Care: keep wound clean and dry Functional status:  ___ No restrictions     ___ Walk up steps independently ___ 24/7 supervision/assistance   ___ Walk up steps with assistance ___ Intermittent supervision/assistance  ___ Bathe/dress independently ___ Walk with walker     _x__ Bathe/dress with assistance ___ Walk Independently    ___ Shower independently ___ Walk with assistance    ___ Shower with assistance ___ No alcohol     ___ Return to work/school ________  Special Instructions: Continue intravenous ampicillin 2 g every 4 hours and Rocephin 2 g every 12 hours through 05/20/2020 and stop   My questions have been answered and I understand these instructions. I will adhere to these goals and the provided educational materials after my discharge from the hospital.  Patient/Caregiver Signature _______________________________ Date __________  Clinician Signature _______________________________________ Date __________  Please bring this form and your medication list with you to all your follow-up doctor's appointments.

## 2020-05-01 NOTE — Progress Notes (Signed)
Subjective:  Patient reports some left knee pain, denies significant right knee pain, but he has had significant recurrence of effusions.  Aspiration results from yesterday on the left knee demonstrated more than 100,000 white blood cells, positive crystals.  Fluid was not able to be obtained from the right knee.  Patient reports some pain to movement of the left knee, the right knee is not that bad.    Objective:   VITALS:   Vitals:   04/30/20 1350 04/30/20 1940 04/30/20 2054 05/01/20 0327  BP: (!) 107/55 125/63 122/81 (!) 98/57  Pulse: 65 64 68 65  Resp: 17 16  17   Temp: 98.3 F (36.8 C) 98.6 F (37 C)  98.4 F (36.9 C)  TempSrc: Oral   Oral  SpO2: 100% 100%  97%  Weight: 81.1 kg     Height: 5\' 10"  (1.778 m)      Bilateral knees have no significant erythema over the knee itself, but there are substantial fluid collections with effusions, left much greater than right.  Range of motion from 5degrees to 125 degrees on the right side, and 120 degrees on the left side.  Lab Results  Component Value Date   WBC 6.8 05/01/2020   HGB 7.4 (L) 05/01/2020   HCT 24.7 (L) 05/01/2020   MCV 96.5 05/01/2020   PLT 283 05/01/2020   BMET    Component Value Date/Time   NA 135 05/01/2020 0420   NA 139 02/04/2020 1451   K 4.0 05/01/2020 0420   CL 102 05/01/2020 0420   CO2 27 05/01/2020 0420   GLUCOSE 102 (H) 05/01/2020 0420   BUN 15 05/01/2020 0420   BUN 22 02/04/2020 1451   CREATININE 1.25 (H) 05/01/2020 0420   CALCIUM 8.7 (L) 05/01/2020 0420   GFRNONAA 59 (L) 05/01/2020 0420   GFRAA >60 05/01/2020 0420     Assessment/Plan:     Active Problems:   Discitis   Bilateral knee effusion, history of left knee sepsis, status post bilateral knee arthroscopic washout, with recurrent fluid accumulations, as well as some pain on the left side, with an aspiration of the left side demonstrates over 100,000 white blood cells, with history of systemic sepsis including his heart  valves, his spine, and his knee.  This is been a persistent complicated and life-threatening problem.  I have discussed the options with him, I am not convinced that his right side is recurrent infection but it definitely has recurrent effusion.  The left side is very worrisome given the large effusion, pain, and white cell count greater than 100,000.  I am awaiting MRI results from both of his knees, I am concerned about the development of osteomyelitis, but I would recommend a repeat arthroscopic washout of at least the left knee, maybe even both knees.  After an extensive discussion with the patient, work on a plan for bilateral knee arthroscopy with lavage and washout, the time continue management under control of infection.  He will be weightbearing as tolerated and can resume physical therapy, he is very worried about not being able to continue with the rehab floor, hopefully we can admit him back to the rehab unit after his operation, were planning on surgery later this afternoon.  He had a single egg to eat for breakfast about an hour ago.  We will keep him n.p.o. going forward, and plan for surgery later today.  Risks benefits and alternatives been discussed in detail, all questions been answered, we have discussed the  risks for the need for future for surgical intervention, development of osteomyelitis, even the potential for amputation, and he understands and is willing to proceed.  Johnny Bridge 05/01/2020, 8:09 AM   Marchia Bond, MD Cell [phone]

## 2020-05-01 NOTE — Progress Notes (Signed)
Patient information reviewed and entered into [facility] by Becky Tatem Fesler, PPS coordinator. Information including medical coding, function ability, and quality indicators will be reviewed and updated through discharge.   

## 2020-05-01 NOTE — Progress Notes (Signed)
Physical Therapy Session Note  Patient Details  Name: Johnathan Murray MRN: [id number] Date of Birth: 1951-01-27  Today's Date: 05/01/2020 PT Individual Time: 5072-2575 PT Individual Time Calculation (min): 30 min   Short Term Goals: Week 1:  PT Short Term Goal 1 (Week 1): Pt will perform supine<>sit with supervision. PT Short Term Goal 2 (Week 1): Pt will perform sit to stand with minA. PT Short Term Goal 3 (Week 1): Pt will perform bed>chair transfer with minA. PT Short Term Goal 4 (Week 1): Pt will ambulate 32' with minA.  Skilled Therapeutic Interventions/Progress Updates:   Pt received supine in bed and agreeable to PT at bed level. Pt instructed in supine therex for BLE: SLR with AAROM to the LLEx 8. hee slides x 8 BLE, hip abduction x 10 BLE with intermittent AAROM on the L, ankle PF/DF x 20 in to bolster on foot board. SAQ x 10 with AAROM on the R. Cues for full ROM and proper speed to maximize strengthening benefits of each movement. Pt reporting increased LE and back pain throughout treatment. Pt allowed rest as pain levels increased in preparation for procedure in AM on 6/12. Left supine in bed with call bell in reach and all needs met.       Therapy Documentation Precautions:  Precautions Precautions: Sternal, Fall Precaution Comments: ACDF on 4/12, pt reports they d/c-ed collar 1 week after surgery Required Braces or Orthoses: Spinal Brace Spinal Brace: Lumbar corset Other Brace: for comfort, pt reports "it doesn't really help me" so we didn't use back brace Restrictions Weight Bearing Restrictions: Yes Other Position/Activity Restrictions: sternal precautions General: PT Amount of Missed Time (min): 15 Minutes PT Missed Treatment Reason: Pain Vital Signs: Therapy Vitals Temp: 99.2 F (37.3 C) Temp Source: Oral Pulse Rate: 61 Resp: 16 BP: 124/74 Patient Position (if appropriate): Lying Oxygen Therapy SpO2: 98 % O2 Device: Room Air Pain: Pain  Assessment Pain Scale: 0-10 Pain Score: 0-No pain at rest. Increased to 6/10 following therex in the LLE and low back. Pt repositioned.     Therapy/Group: Individual Therapy  Lorie Phenix 05/01/2020, 11:38 PM

## 2020-05-01 NOTE — Evaluation (Signed)
Physical Therapy Assessment and Plan  Patient Details  Name: Johnathan Murray MRN: [id number] Date of Birth: [birth year]  PT Diagnosis: Difficulty walking, Low back pain and Muscle weakness Rehab Potential: Good ELOS: 2-2.5 weeks   Today's Date: 05/01/2020 PT Individual Time: 0762-2633 PT Individual Time Calculation (min): 42 min   PT Time Missed : 18 minutes (pain)  Problem List:  Patient Active Problem List   Diagnosis Date Noted  . Persistent atrial fibrillation ([place])   . Prosthetic valve endocarditis ([place]) 04/13/2020  . Aortic valve abscess 04/13/2020  . Endocarditis 04/13/2020  . Typical atrial flutter ([place])   . Discitis 04/07/2020  . Protein-calorie malnutrition, severe 03/24/2020  . Septic arthritis ([place]) 03/23/2020  . History of fusion of cervical spine 03/23/2020  . Bacteremia due to Enterococcus 03/21/2020  . Pseudogout of left knee 03/21/2020  . ARF (acute renal failure) ([place]) 03/20/2020  . S/P AVR 01/17/2020  . Renal insufficiency 01/17/2020  . Pre-operative cardiovascular examination 01/03/2020  . Acute otitis externa of left ear 11/13/2019  . Acute suppurative otitis media of left ear without spontaneous rupture of tympanic membrane 11/13/2019  . Conductive hearing loss of left ear 11/13/2019  . Tinnitus of left ear 11/13/2019  . Lumbar stenosis with neurogenic claudication 06/11/2019  . Cervical myelopathy ([place]) 04/22/2019  . Preoperative cardiovascular examination 04/17/2019  . Intermittent claudication ([place]) 10/31/2018  . Coronary artery disease of native artery of native heart with stable angina pectoris ([place])   . Abnormal nuclear stress test 05/03/2018  . Aortic stenosis 05/03/2018  . Acid reflux 02/15/2018  . Arthritis 02/15/2018  . Hyperactive gag reflex 02/15/2018  . Macular degeneration 02/15/2018  . Abnormal LFTs (liver function tests) 10/31/2016  . Coronary artery disease involving native coronary artery of native heart without angina pectoris  05/30/2016  . Diabetes mellitus due to underlying condition with unspecified complications ([place]) 35/45/6256  . Dyslipidemia 05/30/2016  . Essential hypertension 05/30/2016  . PAF (paroxysmal atrial fibrillation) ([place]) 05/30/2016    Past Medical History:  Past Medical History:  Diagnosis Date  . Abnormal LFTs (liver function tests) 10/31/2016  . Abnormal nuclear stress test 05/03/2018  . Acid reflux 02/15/2018   not at present  . Acute otitis externa of left ear 11/13/2019  . Acute suppurative otitis media of left ear without spontaneous rupture of tympanic membrane 11/13/2019  . Anemia    low iron  . Aortic stenosis 05/03/2018  . Aortic valve abscess 04/13/2020  . ARF (acute renal failure) ([place]) 03/20/2020  . Arthritis 02/15/2018   Overview:  shoulder  . Bacteremia due to Enterococcus 03/21/2020  . Cervical myelopathy ([place]) 04/22/2019  . Conductive hearing loss of left ear 11/13/2019  . Coronary artery disease involving native coronary artery of native heart without angina pectoris 05/30/2016  . Coronary artery disease of native artery of native heart with stable angina pectoris ([place])    pt. denies  . Diabetes mellitus due to underlying condition with unspecified complications ([place]) [birth year]  . Discitis 04/07/2020  . Dyslipidemia 05/30/2016  . Dyspnea    upon exertion,  as of 06/05/09 not having any recent chest pain  . Dysrhythmia    a fib  . Endocarditis 04/13/2020  . Essential hypertension 05/30/2016  . Heart murmur    Has had an Aortic valve replacement  . History of fusion of cervical spine 03/23/2020  . Hyperactive gag reflex 02/15/2018  . Hypothyroidism   . Intermittent claudication ([place]) 10/31/2018  . Lumbar stenosis with neurogenic claudication 06/11/2019  .  Macular degeneration 02/15/2018  . PAF (paroxysmal atrial fibrillation) ([place]) 05/30/2016  . Pre-operative cardiovascular examination 01/03/2020  . Preoperative cardiovascular examination 04/17/2019  . Prosthetic valve  endocarditis ([place]) 04/13/2020  . Protein-calorie malnutrition, severe 03/24/2020  . Pseudogout of left knee 03/21/2020  . Renal insufficiency 01/17/2020  . S/P aortic valve replacement 01/17/2020  . S/P AVR 01/17/2020  . Septic arthritis ([place]) 03/23/2020  . Tinnitus of left ear 11/13/2019  . Typical atrial flutter [facility])    Past Surgical History:  Past Surgical History:  Procedure Laterality Date  . ANTERIOR CERVICAL DECOMP/DISCECTOMY FUSION N/A 04/22/2019   Procedure: Anterior Cervical Discectomy and Fusion, Cervical four-five, Cervical five-six;  Surgeon: Earnie Larsson, MD;  Location: [place];  Service: Neurosurgery;  Laterality: N/A;  . ANTERIOR CERVICAL DECOMP/DISCECTOMY FUSION N/A 03/02/2020   Procedure: Anterior Cervical Decompression Fusion  - Cervical three-Cervical four;  Surgeon: Earnie Larsson, MD;  Location: [place];  Service: Neurosurgery;  Laterality: N/A;  . AORTIC VALVE REPLACEMENT    . ASCENDING AORTIC ROOT REPLACEMENT N/A 04/16/2020   Procedure: ASCENDING AORTIC ROOT REPLACEMENT - HOMOGRAFT using [place] 23 MM Cryo Aortic Valve.;  Surgeon: Gaye Pollack, MD;  Location: [facility] OR;  Service: Open Heart Surgery;  Laterality: N/A;  . CARDIAC CATHETERIZATION     several stents  . CARDIAC SURGERY    . CARDIOVERSION N/A 04/13/2020   Procedure: CARDIOVERSION;  Surgeon: Geralynn Rile, MD;  Location: [place];  Service: Cardiovascular;  Laterality: N/A;  . CARDIOVERSION N/A 04/29/2020   Procedure: CARDIOVERSION;  Surgeon: Skeet Latch, MD;  Location: [facility] ENDOSCOPY;  Service: Cardiovascular;  Laterality: N/A;  . CORONARY ARTERY BYPASS GRAFT  2017   patient unsure how many, had at [facility]  . HAND SURGERY    . IR LUMBAR [place] W/IMG GUIDE  04/07/2020  . IRRIGATION AND DEBRIDEMENT KNEE Bilateral 03/24/2020   Procedure: ARTHROSCOPY IRRIGATION AND DEBRIDEMENT BILATERAL  KNEE;  Surgeon: Marchia Bond, MD;  Location: [place];  Service: Orthopedics;  Laterality:  Bilateral;  . KNEE SURGERY    . LEFT HEART CATH AND CORONARY ANGIOGRAPHY N/A 01/21/2020   Procedure: LEFT HEART CATH AND CORONARY ANGIOGRAPHY;  Surgeon: Belva Crome, MD;  Location: [place] CV LAB;  Service: Cardiovascular;  Laterality: N/A;  . LEFT HEART CATH AND CORS/GRAFTS ANGIOGRAPHY N/A 05/16/2018   Procedure: LEFT HEART CATH AND CORS/GRAFTS ANGIOGRAPHY;  Surgeon: Burnell Blanks, MD;  Location: [place] CV LAB;  Service: Cardiovascular;  Laterality: N/A;  . LUMBAR LAMINECTOMY/DECOMPRESSION MICRODISCECTOMY Bilateral 06/11/2019   Procedure: Bilateral Lumbar Four-FiveLaminectomy/Foraminotomy;  Surgeon: Earnie Larsson, MD;  Location: [place];  Service: Neurosurgery;  Laterality: Bilateral;  posterior  . TEE WITHOUT CARDIOVERSION N/A 03/26/2020   Procedure: TRANSESOPHAGEAL ECHOCARDIOGRAM (TEE);  Surgeon: Jerline Pain, MD;  Location: [facility] ENDOSCOPY;  Service: Cardiovascular;  Laterality: N/A;  . TEE WITHOUT CARDIOVERSION N/A 04/13/2020   Procedure: TRANSESOPHAGEAL ECHOCARDIOGRAM (TEE);  Surgeon: Geralynn Rile, MD;  Location: [place];  Service: Cardiovascular;  Laterality: N/A;  . TEE WITHOUT CARDIOVERSION N/A 04/16/2020   Procedure: TRANSESOPHAGEAL ECHOCARDIOGRAM (TEE);  Surgeon: Gaye Pollack, MD;  Location: [place];  Service: Open Heart Surgery;  Laterality: N/A;  . TEE WITHOUT CARDIOVERSION N/A 04/29/2020   Procedure: TRANSESOPHAGEAL ECHOCARDIOGRAM (TEE);  Surgeon: Skeet Latch, MD;  Location: [facility] ENDOSCOPY;  Service: Cardiovascular;  Laterality: N/A;    Assessment & Plan Clinical Impression: Patient is a 69 year old right-handed male history of diabetes mellitus, PAF maintained on amiodarone as well  as Eliquis, hypertension, bioprosthetic aortic valve replacement 2017, lumbar laminectomy decompression 06/11/2019 as well as recent C3-4 anterior cervical discectomy with interbody fusion for cervical stenosis myelopathy 03/02/2020 per [name].  Per chart review lives with  spouse.  1 level home 7 steps to entry.  He was using a rolling walker prior to admission.  Wife works during the day.  Presented 03/19/2020 with low-grade fever and weakness of lower extremities.  Lactic acid 1.6, sodium 130, BUN 98, creatinine 6.40 with latest prior creatinine 1.6345 2021, WBC 12,600, hemoglobin 10.4, CK 20, urinalysis showed RBCs of 21-50 WBC 21-50 protein around 100 bacteria rare hyaline cast, SARS coronavirus negative.  His LFTs were markedly elevated AST 415 ALT 429 total bilirubin 5.5.  CT abdomen pelvis as well as CT cervical spine unremarkable.  MRI cervical spine negative for infection.  Patient was hypotensive received fluid bolus.  Placed on broad-spectrum antibiotics for suspected sepsis.  MRI lumbar spine findings consistent with acute osteomyelitis discitis L4-5.  Evidence for associated soft tissue infection cellulitis within the psoas musculature bilaterally with a few small superimposed abscesses.  Additional multilevel degenerative spondylosis most pronounced at L3-4.  Neurosurgery consulted no plan for surgical intervention underwent aspiration of disc space L4-5.  TEE showed no obvious vegetation and follow-up cardiology services.  Orthopedic services consulted Dr. Mardelle Matte for bilateral knee pain with noted swelling of left knee.  X-ray showed diffuse degenerative changes as well as large joint effusion.  Doppler showed ruptured Baker's cyst on the left lower extremity.  Patient underwent left lower extremity joint aspiration 03/21/2020 and right lower extremity joint aspiration 03/22/2020 both showing intracellular calcium pyrophosphate crystals WBC was up to 15,000 culture grew rare E Faecalis.  Patient did receive steroid injection of both knees.  Underwent I&D bilateral knees 03/24/2020.  Infectious disease continue to follow maintained on intravenous ampicillin x7 to 8 weeks through 05/20/2020 PICC line was placed 03/29/2020.  AKI on CKD stage III continue to improve multifactorial  from bacteremia with latest creatinine 1.39.  Normocytic anemia postoperative patient was transfused 2 units packed red blood cells with latest hemoglobin 8.6 and WBC 9400.  Patient was admitted to inpatient rehab services 04/07/2020 for ongoing comprehensive inpatient rehab services.  Slow progressive gains with noted bouts of tachycardia followed by cardiology services.  His amiodarone had since been resumed now that his renal function had improved.  His Eliquis was ongoing as prior to admission.  Patient with bouts of low-grade fever followed by infectious disease.  Ceftriaxone was added to patient's ampicillin for increased coverage.  A follow-up TEE was completed findings of thoracic root abscess and patient was transferred to acute care services for ongoing care 04/13/2020.  Patient underwent redo sternotomy with ascending aortic root replacement 04/16/2020 per Dr. Cyndia Bent.  His Eliquis has been resumed.  Patient with persistent bouts of A. fib with RVR undergoing TEE/DCCV 04/29/2020 per cardiology services that showed,  bioprosthetic aortic valve well-seated without an perivalvular regurgitation.  No LA/LAA thrombus or masses and was initially placed on IV amiodarone and transition to p.o. amiodarone 200 mg twice daily as well as low-dose beta-blocker and close monitoring of heart rate.  Ceftriaxone ampicillin ongoing again followed by infectious disease through 05/20/2020.  Therapy evaluations completed and patient was admitted back to inpatient rehab services for comprehensive therapies   Patient transferred to CIR on 04/30/2020 .   Patient currently requires mod with mobility secondary to muscle weakness, decreased cardiorespiratoy endurance and decreased sitting balance, decreased standing balance and  decreased balance strategies.  Prior to hospitalization, patient was modified independent  with mobility and lived with Spouse in a House home.  Home access is 8 .  Patient will benefit from skilled PT  intervention to maximize safe functional mobility, minimize fall risk and decrease caregiver burden for planned discharge home with 24 hour supervision.  Anticipate patient will benefit from follow up [place] at discharge.  PT - End of Session Activity Tolerance: Tolerates 30+ min activity with multiple rests Endurance Deficit: Yes Endurance Deficit Description: Secondary to deconditioning PT Assessment Rehab Potential (ACUTE/IP ONLY): Good PT Barriers to Discharge: Home environment access/layout;Pending surgery PT Patient demonstrates impairments in the following area(s): Balance;Edema;Endurance;Motor;Pain;Safety PT Transfers Functional Problem(s): Bed Mobility;Bed to Chair;Car PT Locomotion Functional Problem(s): Ambulation;Stairs PT Plan PT Intensity: Minimum of 1-2 x/day ,45 to 90 minutes PT Frequency: 5 out of 7 days PT Duration Estimated Length of Stay: 2-2.5 weeks PT Treatment/Interventions: Ambulation/gait training;Cognitive remediation/compensation;Discharge planning;DME/adaptive equipment instruction;Functional mobility training;Pain management;Psychosocial support;Splinting/orthotics;Therapeutic Activities;UE/LE Strength taining/ROM;Visual/perceptual remediation/compensation;Balance/vestibular training;Disease management/prevention;Community reintegration;Functional electrical stimulation;Neuromuscular re-education;Patient/family education;Stair training;Therapeutic Exercise;UE/LE Coordination activities;Wheelchair propulsion/positioning PT Transfers Anticipated Outcome(s): Supervision PT Locomotion Anticipated Outcome(s): Supervision PT Recommendation Follow Up Recommendations: Home health PT;24 hour supervision/assistance Patient destination: Home Equipment Recommended: To be determined  Skilled Therapeutic Intervention  Evaluation completed (see details above and below) with education on PT POC and goals and individual treatment initiated with focus on bed mobility, functional  transfers, ambulation, and car transfers.   Pt received supine in bed and agreeable to therapy. Reports pain in back upon movement as well as bilateral knees. Number not provided. PT provides rest breaks as needed and end session early secondary to back discomfort. RN aware. Sit to supine with supervision and verbal cues for sequencing. ModA for stand pivot to [facility] with HHA and tactile cues at hips and verbal cues for hand placement and sequencing.   Pt performs car transfer with PT education on technique and modA tactile support at hips. Pt reports L knee "goes out" during transfer and requires rest break to recover. Pt requires modA for sit to stand with cues on body mechanics and ambulates 5' with RW and minA at hips for safety. Pt experiences increased discomfort in back and requests to return to room. ModA for lateral transfer back to bed and minA BLE management for return to supine. Session ends 18 minutes early due to pain.     PT Evaluation Precautions/Restrictions Precautions Precautions: Sternal;Fall Precaution Comments: ACDF on 4/12, pt reports they d/c-ed collar 1 week after surgery Required Braces or Orthoses: Spinal Brace Spinal Brace: Lumbar corset Other Brace: for comfort, pt reports "it doesn't really help me" so we didn't use back brace Restrictions Weight Bearing Restrictions: Yes Other Position/Activity Restrictions: sternal precautions General Chart Reviewed: Yes Family/Caregiver Present: Yes  Home Living/Prior Functioning Home Living Available Help at Discharge: Family;Available 24 hours/day Type of Home: House Home Access: Stairs to enter [employer] of Steps: 8 Entrance Stairs-Rails: Right;Left Home Layout: One level  Lives With: Spouse Prior Function Level of Independence: Independent with basic ADLs;Requires assistive device for independence;Independent with transfers  Able to Take Stairs?: Yes Driving: Yes Vision/Perception   Perception Perception: Within Functional Limits Praxis Praxis: Intact  Cognition Overall Cognitive Status: Within Functional Limits for tasks assessed Arousal/Alertness: Awake/alert Orientation Level: Oriented X4 Safety/Judgment: Appears intact Sensation Sensation Light Touch: Impaired by gross assessment Peripheral sensation comments: Some loss of light touch sensation in distal extremities Coordination Gross Motor Movements are Fluid and Coordinated: Yes Motor  Motor  Motor: Within Functional Limits  Mobility Bed Mobility Bed Mobility: Supine to Sit;Sit to Supine Supine to Sit: Supervision/Verbal cueing Sit to Supine: Minimal Assistance - Patient > 75% Transfers Transfers: Sit to Stand;Stand to [employer];Lateral/Scoot Transfers Sit to Stand: Moderate Assistance - Patient 50-74% Stand to Sit: Moderate Assistance - Patient 50-74% Stand Pivot Transfers: Moderate Assistance - Patient 50 - 74% Stand Pivot Transfer Details: Tactile cues for posture;Verbal cues for technique;Verbal cues for precautions/safety;Tactile cues for initiation Lateral/Scoot Transfers: Moderate Assistance - Patient 50-74% Transfer (Assistive device): 1 person hand held assist Locomotion  Gait Ambulation: Yes Gait Assistance: Minimal Assistance - Patient > 75% Gait Distance (Feet): 5 Feet Assistive device: Rolling walker Gait Assistance Details: Verbal cues for technique;Tactile cues for posture;Verbal cues for sequencing;Verbal cues for safe use of DME/AE Gait Gait: Yes Gait Pattern: Shuffle;Trunk flexed Gait velocity: decreased Stairs / Additional Locomotion Stairs: No Wheelchair Mobility Wheelchair Mobility: No  Trunk/Postural Assessment  Cervical Assessment Cervical Assessment: Exceptions to [facility] (forward head) Thoracic Assessment Thoracic Assessment: Exceptions to [facility] (rounded shoulders) Lumbar Assessment Lumbar Assessment: Exceptions to [facility] (posterior pelvic tilt in  sitting) Postural Control Postural Control: Within Functional Limits  Balance Balance Balance Assessed: Yes Static Sitting Balance Static Sitting - Balance Support: Feet supported Static Sitting - Level of Assistance: 5: Stand by assistance Dynamic Sitting Balance Dynamic Sitting - Balance Support: Feet supported Dynamic Sitting - Level of Assistance: 4: Min assist Static Standing Balance Static Standing - Balance Support: During functional activity Static Standing - Level of Assistance: 4: Min assist Dynamic Standing Balance Dynamic Standing - Balance Support: Bilateral upper extremity supported Dynamic Standing - Level of Assistance: 4: Min assist Extremity Assessment      RLE Assessment RLE Assessment: Exceptions to [facility] Active Range of Motion (AROM) Comments: Limited by pain and weakness General Strength Comments: Not formally tested due to knee pain. Grossly at least 3/5 LLE Assessment LLE Assessment: Exceptions to [facility] Active Range of Motion (AROM) Comments: Limited by pain and weakness General Strength Comments: No formally tested due to knee pain. Grossly 3/5    Refer to Care Plan for Long Term Goals  Recommendations for other services: None   Discharge Criteria: Patient will be discharged from PT if patient refuses treatment 3 consecutive times without medical reason, if treatment goals not met, if there is a change in medical status, if patient makes no progress towards goals or if patient is discharged from hospital.  The above assessment, treatment plan, treatment alternatives and goals were discussed and mutually agreed upon: by patient and by family  Breck Coons 05/01/2020, 5:49 PM

## 2020-05-01 NOTE — Telephone Encounter (Signed)
Called and left a message for his wife [name] on her identified phone number.  I spoke with Dr. Sharol Given and he is helping on this case tomorrow but has not seen the patient has reviewed the scans and asked that we refer the patient's wife to Dr. Luanna Cole office for further information I did give her a basics synopsis but certainly more information could be determined from Dr. Mardelle Matte

## 2020-05-01 NOTE — Plan of Care (Signed)
  Problem: Consults Goal: RH GENERAL PATIENT EDUCATION Description: See Patient Education module for education specifics. Outcome: Progressing   Problem: RH SKIN INTEGRITY Goal: RH STG SKIN FREE OF INFECTION/BREAKDOWN Description: Skin remain free of breakdown with min assist Outcome: Progressing   Problem: RH PAIN MANAGEMENT Goal: RH STG PAIN MANAGED AT OR BELOW PT'S PAIN GOAL Description: Less than 4 Outcome: Progressing

## 2020-05-01 NOTE — Telephone Encounter (Signed)
Do you know anything about this pt and him having surgery with Dr. Sharol Given tomorrow?

## 2020-05-01 NOTE — Progress Notes (Signed)
Johnathan Murray PHYSICAL MEDICINE & REHABILITATION PROGRESS NOTE   Subjective/Complaints:  Pt reports didn't sleep well last night- kept waking up.  They drained L knee again- got a tube full out- also spoke to surgeon- they want to take him back to OR for open washout of B/L knees- since even though R didn't get fluid out, concerned about infection in both since not responding to IV ABX like expected.   Since has eaten today, will do in AM and send back to CIR- explained if need to do IV pain meds, will need to go back to Acute hospital- they say they will do blocks to prevent need for IV pain meds and use PO pain meds.     ROS:  Pt denies SOB, abd pain, CP, N/V/C/D, and vision changes  Objective:   MR KNEE RIGHT WO CONTRAST  Result Date: 05/01/2020 CLINICAL DATA:  Bilateral knee pain and swelling. Previous bilateral knee irrigation and debridement on 03/22/2020. Bilateral knee aspiration today. Concern for septic arthritis. EXAM: MRI OF THE RIGHT KNEE WITHOUT CONTRAST TECHNIQUE: Multiplanar, multisequence MR imaging of the knee was performed. No intravenous contrast was administered. COMPARISON:  None. FINDINGS: MENISCI Medial meniscus:  Intact with normal morphology. Lateral meniscus: Mild degeneration of the anterior horn. No evidence of meniscal tear. LIGAMENTS Cruciates: Severe ACL mucoid degeneration without evidence of acute ligamentous injury. The PCL is intact. Collaterals:  Intact. CARTILAGE Patellofemoral: Severe patellofemoral degenerative changes with diffuse chondral thinning and osteophyte formation. Medial: Mild chondral thinning with peripheral osteophytes and subchondral edema. Lateral: Moderate chondral thinning with surface irregularity, osteophytes and peripheral subchondral edema in the lateral femoral condyle. There is a small peripheral osteochondral lesion which appears chronic. No evidence of erosive disease. MISCELLANEOUS Joint: Moderate to large complex joint effusion  with heterogeneous signal, most consistent with synovitis. Popliteal Fossa: There is complex fluid within the popliteus hiatus. No significant Baker's cyst. Extensor Mechanism:  Intact. Bones: No evidence of acute fracture or dislocation. Scattered arthropathic changes as described without bone destruction or generalized periarticular edema to suggest septic arthritis. Other: Subcutaneous edema posterolaterally. No focal fluid collection. IMPRESSION: 1. Moderate to large complex joint effusion with heterogeneous signal, most consistent with synovitis. Associated complex fluid in the popliteus hiatus. 2. No specific evidence of osteomyelitis. 3. Tricompartmental degenerative changes, severe at the patellofemoral compartment. No acute osseous findings. 4. Severe ACL mucoid degeneration without evidence of acute ligamentous injury. Electronically Signed   By: Richardean Sale M.D.   On: 05/01/2020 08:15   MR KNEE LEFT WO CONTRAST  Result Date: 05/01/2020 CLINICAL DATA:  Bilateral knee pain and swelling. Previous bilateral knee irrigation and debridement on 03/22/2020. Bilateral knee aspiration today. Concern for septic arthritis. EXAM: MRI OF THE LEFT KNEE WITHOUT CONTRAST TECHNIQUE: Multiplanar, multisequence MR imaging of the knee was performed. No intravenous contrast was administered. COMPARISON:  Radiographs 03/21/2020 FINDINGS: MENISCI Medial meniscus: Irregular degenerative tear of the posterior horn, involving the meniscal root. There is resulting peripheral extrusion of the meniscus from the joint space. No centrally displaced meniscal fragment identified. Lateral meniscus: There is a radial tear of the posterior horn, best seen on the coronal images. No centrally displaced meniscal fragment. The meniscus is partially extruded peripherally from the joint. LIGAMENTS Cruciates: Poorly defined anterior cruciate ligament, likely due to advanced ACL mucoid degeneration. Integrity of the ligament is difficult to  confirm. There is also probable mucoid degeneration of the PCL. No acute ligamentous findings. Collaterals: Intact. Moderate fluid in the medial collateral ligament bursa.  CARTILAGE Patellofemoral: Advanced patellofemoral arthropathy with diffuse joint space narrowing and osteophytes. Medial: Diffuse chondral thinning, surface irregularity and osteophyte formation. Scattered nonspecific edema and subchondral cyst formation without focal erosions or bone destruction. Lateral: Diffuse chondral thinning, surface irregularity and osteophyte formation. Scattered nonspecific subchondral cyst formation and edema in the lateral femoral condyle. No bone destruction. MISCELLANEOUS Joint: Large complex joint effusion with diffuse synovial irregularity suspicious for synovitis. Popliteal Fossa: There is complex fluid in the popliteus hiatus. There is a small Baker's cyst. Extensor Mechanism:  Intact. Bones: Nonspecific marrow changes as described above, consistent with underlying arthropathy/synovitis. No bone destruction or generalized periarticular edema to suggest septic arthritis or osteomyelitis. Other: Mild soft tissue edema posterolaterally without other focal fluid collection. IMPRESSION: 1. Large complex joint effusion with diffuse synovial irregularity suspicious for synovitis, nonspecific in etiology. Based on prior radiographs and aspiration results, findings could be secondary to CPPD arthropathy. 2. No specific evidence of septic arthritis or osteomyelitis. 3. Complex degenerative tear of the posterior horn of the medial meniscus, involving the meniscal root. Radial tear of the posterior horn of the lateral meniscus. 4. Advanced ACL mucoid degeneration and probable mucoid degeneration of the PCL. 5. Moderate medial collateral ligament bursitis. Electronically Signed   By: Richardean Sale M.D.   On: 05/01/2020 08:24   ECHO TEE  Result Date: 04/29/2020    TRANSESOPHOGEAL ECHO REPORT   Patient Name:   Johnathan Murray Date of Exam: 04/29/2020 Medical Rec #:  [id number]           Height:       70.0 in Accession #:    [id number]          Weight:       177.7 lb Date of Birth:  05-03-1951           BSA:          1.985 m Patient Age:    69 years            BP:           76/37 mmHg Patient Gender: M                   HR:           68 bpm. Exam Location:  Inpatient Procedure: Transesophageal Echo, Cardiac Doppler and Color Doppler Indications:     Atrial fibrillation 427.31 / I48.91  History:         Patient has prior history of Echocardiogram examinations, most                  recent 04/16/2020. CAD, Aortic Valve Disease, Arrythmias:Atrial                  Fibrillation and Atrial Flutter, Signs/Symptoms:Bacteremia;                  Risk Factors:Hypertension, Dyslipidemia and Non-Smoker.                  Aortic Valve: 23 mm Homograft valve/aortic root valve is                  present in the aortic position. Procedure Date: 04/16/2020.  Sonographer:     Vickie Epley RDCS Referring Phys:  [id number] Gaye Pollack Diagnosing Phys: Skeet Latch MD PROCEDURE: After discussion of the risks and benefits of a TEE, an informed consent was obtained from the patient. The transesophogeal probe was passed without difficulty through the esophogus  of the patient. Sedation performed by different physician. The patient was monitored while under deep sedation. Anesthestetic sedation was provided intravenously by Anesthesiology: 317.97mg  of Propofol. Image quality was excellent. The patient's vital signs; including heart rate, blood pressure, and oxygen saturation; remained stable throughout the procedure. The patient developed no complications during the procedure. A successful direct current cardioversion was performed at 200 joules with 1 attempt. IMPRESSIONS  1. Left ventricular ejection fraction, by estimation, is 55 to 60%. The left ventricle has normal function. The left ventricle has no regional wall motion abnormalities.  2. Right  ventricular systolic function is mildly reduced. The right ventricular size is normal.  3. No left atrial/left atrial appendage thrombus was detected. The LAA emptying velocity was 50 cm/s.  4. Multiple regurgitant jets each with mild regurgitation. The mitral valve is normal in structure. Mild to moderate mitral valve regurgitation. No evidence of mitral stenosis.  5. Tricuspid valve regurgitation is mild to moderate.  6. The aortic valve has been repaired/replaced. Aortic valve regurgitation is trivial. No aortic stenosis is present. There is a 23 mm Homograft valve/aortic root valve present in the aortic position. Procedure Date: 04/16/2020. Echo findings are consistent with normal structure and function of the aortic valve prosthesis.  7. The inferior vena cava is normal in size with greater than 50% respiratory variability, suggesting right atrial pressure of 3 mmHg. Conclusion(s)/Recommendation(s): Normal biventricular function without evidence of hemodynamically significant valvular heart disease. FINDINGS  Left Ventricle: Left ventricular ejection fraction, by estimation, is 55 to 60%. The left ventricle has normal function. The left ventricle has no regional wall motion abnormalities. The left ventricular internal cavity size was normal in size. There is  no left ventricular hypertrophy. Right Ventricle: The right ventricular size is normal. No increase in right ventricular wall thickness. Right ventricular systolic function is mildly reduced. Left Atrium: Left atrial size was normal in size. No left atrial/left atrial appendage thrombus was detected. The LAA emptying velocity was 50 cm/s. Right Atrium: Right atrial size was normal in size. Pericardium: There is no evidence of pericardial effusion. Mitral Valve: Multiple regurgitant jets each with mild regurgitation. The mitral valve is normal in structure. Normal mobility of the mitral valve leaflets. Mild to moderate mitral valve regurgitation. No  evidence of mitral valve stenosis. Tricuspid Valve: The tricuspid valve is normal in structure. Tricuspid valve regurgitation is mild to moderate. No evidence of tricuspid stenosis. Aortic Valve: No perivalvular regurgitation or abscess. The aortic valve has been repaired/replaced. Aortic valve regurgitation is trivial. No aortic stenosis is present. There is a 23 mm Homograft valve/aortic root valve present in the aortic position. Procedure Date: 04/16/2020. Echo findings are consistent with normal structure and function of the aortic valve prosthesis. Pulmonic Valve: The pulmonic valve was normal in structure. Pulmonic valve regurgitation is trivial. No evidence of pulmonic stenosis. Aorta: The aortic root is normal in size and structure. There is minimal (Grade I) plaque involving the descending aorta. Venous: The inferior vena cava is normal in size with greater than 50% respiratory variability, suggesting right atrial pressure of 3 mmHg. IAS/Shunts: No atrial level shunt detected by color flow Doppler. There is no evidence of a patent foramen ovale. There is no evidence of an atrial septal defect.  MR Peak grad:    75.3 mmHg   TRICUSPID VALVE MR Mean grad:    42.0 mmHg   TR Peak grad:   18.1 mmHg MR Vmax:         434.00 cm/s  TR Vmax:        213.00 cm/s MR Vmean:        296.0 cm/s MR PISA:         1.01 cm MR PISA Eff ROA: 9 mm MR PISA Radius:  0.40 cm Skeet Latch MD Electronically signed by Skeet Latch MD Signature Date/Time: 04/29/2020/1:20:20 PM    Final    Recent Labs    04/29/20 0500 05/01/20 0420  WBC 7.8 6.8  HGB 8.3* 7.4*  HCT 27.1* 24.7*  PLT 328 283   Recent Labs    04/29/20 0500 05/01/20 0420  NA 137 135  K 3.7 4.0  CL 102 102  CO2 25 27  GLUCOSE 121* 102*  BUN 16 15  CREATININE 1.28* 1.25*  CALCIUM 8.7* 8.7*    Intake/Output Summary (Last 24 hours) at 05/01/2020 0917 Last data filed at 05/01/2020 0700 Gross per 24 hour  Intake 724.48 ml  Output 1475 ml  Net  -750.52 ml     Physical Exam: Vital Signs Blood pressure 127/64, pulse 61, temperature 98.4 F (36.9 C), temperature source Oral, resp. rate 17, height 5\' 10"  (1.778 m), weight 81.1 kg, SpO2 97 %.   Physical Exam  Nursing note and vitals reviewed. Constitutional: sitting up in bed- appropriate, sad, NAD HENT:   conjugate gaze  Cardiovascular: RRR Respiratory:CTA B/L- no W/R/R- good air movement GI: Soft, NT, ND, (+)BS  Hyperactive BS Musculoskeletal:     Cervical back: Normal range of motion and neck supple.     Comments: RUE- biceps 5/5, triceps 4+/5, WE, and grip 5/5, and finger abd 5-5 LUE- Biceps 5/5, triceps 4+/5, WE and grip 5/5; finger abd 5-/5 RLE- HF 3/5, KE/KF 4-/5, DF and PF 4+/5 LLE- HF 3-/5, KE/KF 3+/5, DF and PF 5-/5 L knee still swollen on proximal aspect of patella and R knee also looks swollen, but less so than the L.  Neurological: Ox3.  Sensation intact in all 4 extremities to light touch No Hoffman's, no Clonus   Skin: Skin is warm and dry.  Backside slightly pink- healed stage II- and pink is blanchable currently- no significant moisture Sternal incision looks great; healing Ecchymoses on Arms B/L  Psychiatric: Mood normal.  Sad about surgery    Assessment/Plan: 1. Functional deficits secondary to discitis/ostemyelitis and aortic root replacmeent which require 3+ hours per day of interdisciplinary therapy in a comprehensive inpatient rehab setting.  Physiatrist is providing close team supervision and 24 hour management of active medical problems listed below.  Physiatrist and rehab team continue to assess barriers to discharge/monitor patient progress toward functional and medical goals  Care Tool:  Bathing              Bathing assist       Upper Body Dressing/Undressing Upper body dressing   What is the patient wearing?: Hospital gown only    Upper body assist Assist Level: Minimal Assistance - Patient > 75%    Lower Body  Dressing/Undressing Lower body dressing      What is the patient wearing?: Hospital gown only     Lower body assist Assist for lower body dressing: Minimal Assistance - Patient > 75%     Toileting Toileting    Toileting assist Assist for toileting: Minimal Assistance - Patient > 75% (urinal)     Transfers Chair/bed transfer  Transfers assist           Locomotion Ambulation   Ambulation assist  Walk 10 feet activity   Assist           Walk 50 feet activity   Assist           Walk 150 feet activity   Assist           Walk 10 feet on uneven surface  activity   Assist           Wheelchair     Assist               Wheelchair 50 feet with 2 turns activity    Assist            Wheelchair 150 feet activity     Assist          Blood pressure 127/64, pulse 61, temperature 98.4 F (36.9 C), temperature source Oral, resp. rate 17, height 5\' 10"  (1.778 m), weight 81.1 kg, SpO2 97 %.  Medical Problem List and Plan: 1.  Decreased functional mobility secondary to sepsis/Enterococcus faecalis bacteremia/discitis/osteomyelitis of L4/5 s/p aortic root replacement 04/17/20 s/p cardioversion for Afib with  STERNAL PRECAUTIONS             -patient may  shower             -ELOS/Goals: 2-2.5 weeks- mod I to supervision 2.  Antithrombotics: -DVT/anticoagulation: Eliquis resumed             -antiplatelet therapy: Aspirin 81 mg daily 3. Pain Management: Lyrica 100 mg 3 times daily, OxyContin 10 mg every 12 hours, Flexeril as needed, tramadol as needed- will add Oxycodone 5 mg q4 hours prn- and advise pt to take bowel meds to prevent constipation 4. Mood: Provide emotional support             -antipsychotic agents: N/A 5. Neuropsych: This patient is capable of making decisions on his own behalf. 6. Skin/Wound Care: Routine skin checks 7. Fluids/Electrolytes/Nutrition: Routine in and outs with follow-up  chemistries 8.  ID/Enterococcus bacteremia.  Continue intravenous ampicillin 2 g every 4 hours and ceftriaxone 2 g every 12 hours through 05/20/2020.  Follow-up infectious disease  6/11- going back to OR for B/L septic knees per Ortho tomorrow- thinking might require 6 MORE weeks of IV ABX.  9.  AKI.  Follow-up chemistries 10.  Thoracic root abscess.  Status post sternotomy with ascending aortic root replacement 04/16/2020 as well as history of bioprosthetic aortic valve replacement 2017. 12.  PAF.  Amiodarone 400 mg BID x7 days then 200 mg daily,Lopressor 25 mg twice daily.  Cardiac rate controlled 12.  Diabetes mellitus.  Hemoglobin A1c 6.8.  Levemir 8 units daily.  Check blood sugars before meals and at bedtime CBG (last 3)  Recent Labs    04/30/20 1640 04/30/20 2105 05/01/20 0622  GLUCAP 182* 77 109*   6/11- BGs 77-182- con't regimen for now  13.  Acute on chronic anemia.  Follow-up CBC.  Continue iron supplement  6/11- Hb down to 7.4- might need transfusion soon 14.  Septic knee.  Status post aspiration and steroid injection with I&D of bilateral knees 03/24/2020.  Follow-up orthopedic services 15.  Recent C3-4 ACDF 03/02/2020 per [name] for cervical myelopathy.  Cervical collar discontinued 16.  Hypothyroidism.  Synthroid 17.  Hypertension.  Continue Lopressor Lopressor 25 mg twice daily 18.  Constipation.  Continue Colace as well as scheduled MiraLAX- will also add Senokot and Sorbitol if no BM in 2 days 19.  GERD.  Protonix    LOS: 1  days A FACE TO FACE EVALUATION WAS PERFORMED  Ignacia Gentzler 05/01/2020, 9:17 AM

## 2020-05-02 ENCOUNTER — Encounter (HOSPITAL_COMMUNITY)
Admission: RE | Disposition: A | Discharge status: Other Acute Inpatient Hospital | Payer: Self-pay | Source: Intra-hospital | Attending: Physical Medicine and Rehabilitation

## 2020-05-02 ENCOUNTER — Inpatient Hospital Stay (HOSPITAL_COMMUNITY): Payer: BC Managed Care – PPO | Admitting: Certified Registered"

## 2020-05-02 ENCOUNTER — Inpatient Hospital Stay: Admit: 2020-05-02 | Payer: Medicare Other | Admitting: Orthopedic Surgery

## 2020-05-02 ENCOUNTER — Encounter (HOSPITAL_COMMUNITY): Payer: Self-pay | Admitting: Physical Medicine and Rehabilitation

## 2020-05-02 ENCOUNTER — Inpatient Hospital Stay (HOSPITAL_COMMUNITY)
Admission: EM | Admit: 2020-05-02 | Discharge: 2020-05-14 | DRG: 811 | Disposition: A | Discharge status: Rehabilitation Facility | Payer: BC Managed Care – PPO | Source: Ambulatory Visit | Attending: Internal Medicine | Admitting: Internal Medicine

## 2020-05-02 DIAGNOSIS — I330 Acute and subacute infective endocarditis: Secondary | ICD-10-CM | POA: Diagnosis present

## 2020-05-02 DIAGNOSIS — J392 Other diseases of pharynx: Secondary | ICD-10-CM | POA: Diagnosis present

## 2020-05-02 DIAGNOSIS — Z20822 Contact with and (suspected) exposure to covid-19: Secondary | ICD-10-CM | POA: Diagnosis not present

## 2020-05-02 DIAGNOSIS — I493 Ventricular premature depolarization: Secondary | ICD-10-CM | POA: Diagnosis present

## 2020-05-02 DIAGNOSIS — T826XXA Infection and inflammatory reaction due to cardiac valve prosthesis, initial encounter: Secondary | ICD-10-CM | POA: Diagnosis present

## 2020-05-02 DIAGNOSIS — Y831 Surgical operation with implant of artificial internal device as the cause of abnormal reaction of the patient, or of later complication, without mention of misadventure at the time of the procedure: Secondary | ICD-10-CM | POA: Diagnosis present

## 2020-05-02 DIAGNOSIS — E088 Diabetes mellitus due to underlying condition with unspecified complications: Secondary | ICD-10-CM | POA: Diagnosis not present

## 2020-05-02 DIAGNOSIS — I483 Typical atrial flutter: Secondary | ICD-10-CM | POA: Diagnosis present

## 2020-05-02 DIAGNOSIS — I38 Endocarditis, valve unspecified: Secondary | ICD-10-CM | POA: Diagnosis not present

## 2020-05-02 DIAGNOSIS — G8918 Other acute postprocedural pain: Secondary | ICD-10-CM | POA: Diagnosis not present

## 2020-05-02 DIAGNOSIS — H3530 Unspecified macular degeneration: Secondary | ICD-10-CM | POA: Diagnosis present

## 2020-05-02 DIAGNOSIS — I9589 Other hypotension: Secondary | ICD-10-CM | POA: Diagnosis not present

## 2020-05-02 DIAGNOSIS — L7622 Postprocedural hemorrhage and hematoma of skin and subcutaneous tissue following other procedure: Secondary | ICD-10-CM | POA: Diagnosis present

## 2020-05-02 DIAGNOSIS — R5381 Other malaise: Secondary | ICD-10-CM | POA: Diagnosis not present

## 2020-05-02 DIAGNOSIS — I10 Essential (primary) hypertension: Secondary | ICD-10-CM | POA: Diagnosis present

## 2020-05-02 DIAGNOSIS — I081 Rheumatic disorders of both mitral and tricuspid valves: Secondary | ICD-10-CM | POA: Diagnosis present

## 2020-05-02 DIAGNOSIS — E1169 Type 2 diabetes mellitus with other specified complication: Secondary | ICD-10-CM | POA: Diagnosis not present

## 2020-05-02 DIAGNOSIS — R9431 Abnormal electrocardiogram [ECG] [EKG]: Secondary | ICD-10-CM | POA: Diagnosis present

## 2020-05-02 DIAGNOSIS — Z888 Allergy status to other drugs, medicaments and biological substances status: Secondary | ICD-10-CM

## 2020-05-02 DIAGNOSIS — M170 Bilateral primary osteoarthritis of knee: Secondary | ICD-10-CM | POA: Diagnosis present

## 2020-05-02 DIAGNOSIS — I4892 Unspecified atrial flutter: Secondary | ICD-10-CM | POA: Diagnosis not present

## 2020-05-02 DIAGNOSIS — I2582 Chronic total occlusion of coronary artery: Secondary | ICD-10-CM | POA: Diagnosis present

## 2020-05-02 DIAGNOSIS — Z951 Presence of aortocoronary bypass graft: Secondary | ICD-10-CM

## 2020-05-02 DIAGNOSIS — E1165 Type 2 diabetes mellitus with hyperglycemia: Secondary | ICD-10-CM | POA: Diagnosis present

## 2020-05-02 DIAGNOSIS — Z79899 Other long term (current) drug therapy: Secondary | ICD-10-CM

## 2020-05-02 DIAGNOSIS — R7881 Bacteremia: Secondary | ICD-10-CM | POA: Diagnosis not present

## 2020-05-02 DIAGNOSIS — B952 Enterococcus as the cause of diseases classified elsewhere: Secondary | ICD-10-CM | POA: Diagnosis not present

## 2020-05-02 DIAGNOSIS — H902 Conductive hearing loss, unspecified: Secondary | ICD-10-CM | POA: Diagnosis present

## 2020-05-02 DIAGNOSIS — E1151 Type 2 diabetes mellitus with diabetic peripheral angiopathy without gangrene: Secondary | ICD-10-CM | POA: Diagnosis present

## 2020-05-02 DIAGNOSIS — M009 Pyogenic arthritis, unspecified: Secondary | ICD-10-CM | POA: Diagnosis not present

## 2020-05-02 DIAGNOSIS — N183 Chronic kidney disease, stage 3 unspecified: Secondary | ICD-10-CM | POA: Diagnosis not present

## 2020-05-02 DIAGNOSIS — A4181 Sepsis due to Enterococcus: Secondary | ICD-10-CM | POA: Diagnosis present

## 2020-05-02 DIAGNOSIS — Y838 Other surgical procedures as the cause of abnormal reaction of the patient, or of later complication, without mention of misadventure at the time of the procedure: Secondary | ICD-10-CM | POA: Diagnosis present

## 2020-05-02 DIAGNOSIS — I480 Paroxysmal atrial fibrillation: Secondary | ICD-10-CM | POA: Diagnosis not present

## 2020-05-02 DIAGNOSIS — I4819 Other persistent atrial fibrillation: Secondary | ICD-10-CM | POA: Diagnosis not present

## 2020-05-02 DIAGNOSIS — M25561 Pain in right knee: Secondary | ICD-10-CM | POA: Diagnosis not present

## 2020-05-02 DIAGNOSIS — E785 Hyperlipidemia, unspecified: Secondary | ICD-10-CM | POA: Diagnosis present

## 2020-05-02 DIAGNOSIS — T826XXD Infection and inflammatory reaction due to cardiac valve prosthesis, subsequent encounter: Secondary | ICD-10-CM | POA: Diagnosis not present

## 2020-05-02 DIAGNOSIS — E876 Hypokalemia: Secondary | ICD-10-CM | POA: Diagnosis not present

## 2020-05-02 DIAGNOSIS — M4646 Discitis, unspecified, lumbar region: Secondary | ICD-10-CM | POA: Diagnosis present

## 2020-05-02 DIAGNOSIS — Z981 Arthrodesis status: Secondary | ICD-10-CM

## 2020-05-02 DIAGNOSIS — I959 Hypotension, unspecified: Secondary | ICD-10-CM | POA: Diagnosis present

## 2020-05-02 DIAGNOSIS — L89302 Pressure ulcer of unspecified buttock, stage 2: Secondary | ICD-10-CM | POA: Diagnosis not present

## 2020-05-02 DIAGNOSIS — E440 Moderate protein-calorie malnutrition: Secondary | ICD-10-CM | POA: Diagnosis not present

## 2020-05-02 DIAGNOSIS — I130 Hypertensive heart and chronic kidney disease with heart failure and stage 1 through stage 4 chronic kidney disease, or unspecified chronic kidney disease: Secondary | ICD-10-CM | POA: Diagnosis not present

## 2020-05-02 DIAGNOSIS — D62 Acute posthemorrhagic anemia: Secondary | ICD-10-CM | POA: Diagnosis not present

## 2020-05-02 DIAGNOSIS — M25562 Pain in left knee: Secondary | ICD-10-CM | POA: Diagnosis not present

## 2020-05-02 DIAGNOSIS — N179 Acute kidney failure, unspecified: Secondary | ICD-10-CM | POA: Diagnosis not present

## 2020-05-02 DIAGNOSIS — I2510 Atherosclerotic heart disease of native coronary artery without angina pectoris: Secondary | ICD-10-CM | POA: Diagnosis not present

## 2020-05-02 DIAGNOSIS — I447 Left bundle-branch block, unspecified: Secondary | ICD-10-CM | POA: Diagnosis present

## 2020-05-02 DIAGNOSIS — R7401 Elevation of levels of liver transaminase levels: Secondary | ICD-10-CM | POA: Diagnosis not present

## 2020-05-02 DIAGNOSIS — E43 Unspecified severe protein-calorie malnutrition: Secondary | ICD-10-CM | POA: Diagnosis not present

## 2020-05-02 DIAGNOSIS — Z8249 Family history of ischemic heart disease and other diseases of the circulatory system: Secondary | ICD-10-CM

## 2020-05-02 DIAGNOSIS — Z7901 Long term (current) use of anticoagulants: Secondary | ICD-10-CM

## 2020-05-02 DIAGNOSIS — R609 Edema, unspecified: Secondary | ICD-10-CM | POA: Diagnosis not present

## 2020-05-02 DIAGNOSIS — E119 Type 2 diabetes mellitus without complications: Secondary | ICD-10-CM | POA: Diagnosis not present

## 2020-05-02 DIAGNOSIS — R7309 Other abnormal glucose: Secondary | ICD-10-CM | POA: Diagnosis not present

## 2020-05-02 DIAGNOSIS — M19019 Primary osteoarthritis, unspecified shoulder: Secondary | ICD-10-CM | POA: Diagnosis present

## 2020-05-02 DIAGNOSIS — I9581 Postprocedural hypotension: Secondary | ICD-10-CM | POA: Diagnosis present

## 2020-05-02 DIAGNOSIS — E039 Hypothyroidism, unspecified: Secondary | ICD-10-CM | POA: Diagnosis present

## 2020-05-02 DIAGNOSIS — R578 Other shock: Secondary | ICD-10-CM | POA: Diagnosis present

## 2020-05-02 DIAGNOSIS — R601 Generalized edema: Secondary | ICD-10-CM | POA: Diagnosis not present

## 2020-05-02 DIAGNOSIS — I952 Hypotension due to drugs: Secondary | ICD-10-CM | POA: Diagnosis not present

## 2020-05-02 DIAGNOSIS — Z952 Presence of prosthetic heart valve: Secondary | ICD-10-CM

## 2020-05-02 DIAGNOSIS — I4891 Unspecified atrial fibrillation: Secondary | ICD-10-CM | POA: Diagnosis not present

## 2020-05-02 DIAGNOSIS — M4647 Discitis, unspecified, lumbosacral region: Secondary | ICD-10-CM | POA: Diagnosis not present

## 2020-05-02 DIAGNOSIS — Z794 Long term (current) use of insulin: Secondary | ICD-10-CM | POA: Diagnosis not present

## 2020-05-02 DIAGNOSIS — M25461 Effusion, right knee: Secondary | ICD-10-CM | POA: Diagnosis not present

## 2020-05-02 DIAGNOSIS — D638 Anemia in other chronic diseases classified elsewhere: Secondary | ICD-10-CM | POA: Diagnosis not present

## 2020-05-02 DIAGNOSIS — Z87898 Personal history of other specified conditions: Secondary | ICD-10-CM | POA: Diagnosis not present

## 2020-05-02 DIAGNOSIS — M00861 Arthritis due to other bacteria, right knee: Secondary | ICD-10-CM | POA: Diagnosis not present

## 2020-05-02 DIAGNOSIS — I25810 Atherosclerosis of coronary artery bypass graft(s) without angina pectoris: Secondary | ICD-10-CM | POA: Diagnosis not present

## 2020-05-02 DIAGNOSIS — I5033 Acute on chronic diastolic (congestive) heart failure: Secondary | ICD-10-CM | POA: Diagnosis not present

## 2020-05-02 DIAGNOSIS — M4626 Osteomyelitis of vertebra, lumbar region: Secondary | ICD-10-CM | POA: Diagnosis not present

## 2020-05-02 DIAGNOSIS — K5903 Drug induced constipation: Secondary | ICD-10-CM | POA: Diagnosis not present

## 2020-05-02 DIAGNOSIS — M00862 Arthritis due to other bacteria, left knee: Secondary | ICD-10-CM | POA: Diagnosis not present

## 2020-05-02 DIAGNOSIS — Z7982 Long term (current) use of aspirin: Secondary | ICD-10-CM

## 2020-05-02 DIAGNOSIS — Z79890 Hormone replacement therapy (postmenopausal): Secondary | ICD-10-CM

## 2020-05-02 DIAGNOSIS — E8809 Other disorders of plasma-protein metabolism, not elsewhere classified: Secondary | ICD-10-CM | POA: Diagnosis not present

## 2020-05-02 DIAGNOSIS — E46 Unspecified protein-calorie malnutrition: Secondary | ICD-10-CM | POA: Diagnosis not present

## 2020-05-02 HISTORY — DX: Acute posthemorrhagic anemia: D62

## 2020-05-02 HISTORY — PX: SYNOVECTOMY: SHX5180

## 2020-05-02 HISTORY — DX: Hypotension, unspecified: I95.9

## 2020-05-02 HISTORY — DX: Abnormal electrocardiogram (ECG) (EKG): R94.31

## 2020-05-02 HISTORY — PX: I & D EXTREMITY: SHX5045

## 2020-05-02 LAB — PREPARE RBC (CROSSMATCH)

## 2020-05-02 LAB — GLUCOSE, CAPILLARY
Glucose-Capillary: 148 mg/dL — ABNORMAL HIGH (ref 70–99)
Glucose-Capillary: 228 mg/dL — ABNORMAL HIGH (ref 70–99)
Glucose-Capillary: 217 mg/dL — ABNORMAL HIGH (ref 70–99)
Glucose-Capillary: 319 mg/dL — ABNORMAL HIGH (ref 70–99)
Glucose-Capillary: 299 mg/dL — ABNORMAL HIGH (ref 70–99)

## 2020-05-02 LAB — HEMOGLOBIN AND HEMATOCRIT, BLOOD
HCT: 17.7 % — ABNORMAL LOW (ref 39.0–52.0)
Hemoglobin: 5.6 g/dL — CL (ref 13.0–17.0)

## 2020-05-02 SURGERY — SYNOVECTOMY
Anesthesia: General | Laterality: Bilateral

## 2020-05-02 SURGERY — SYNOVECTOMY
Anesthesia: Choice | Laterality: Bilateral

## 2020-05-02 SURGERY — IRRIGATION AND DEBRIDEMENT EXTREMITY
Anesthesia: Choice | Laterality: Bilateral

## 2020-05-02 MED ORDER — SODIUM CHLORIDE 0.9 % IV SOLN
2.0000 g | INTRAVENOUS | Status: DC
  Administered 2020-05-02 – 2020-05-14 (×71): 2 g via INTRAVENOUS
  Filled 2020-05-02: qty 2
  Filled 2020-05-02 (×4): qty 2000
  Filled 2020-05-02: qty 2
  Filled 2020-05-02 (×2): qty 2000
  Filled 2020-05-02: qty 2
  Filled 2020-05-02 (×8): qty 2000
  Filled 2020-05-02: qty 2
  Filled 2020-05-02: qty 2000
  Filled 2020-05-02: qty 2
  Filled 2020-05-02 (×3): qty 2000
  Filled 2020-05-02 (×2): qty 2
  Filled 2020-05-02 (×4): qty 2000
  Filled 2020-05-02: qty 2
  Filled 2020-05-02: qty 2000
  Filled 2020-05-02: qty 2
  Filled 2020-05-02 (×2): qty 2000
  Filled 2020-05-02: qty 2
  Filled 2020-05-02: qty 2000
  Filled 2020-05-02: qty 2
  Filled 2020-05-02 (×7): qty 2000
  Filled 2020-05-02 (×2): qty 2
  Filled 2020-05-02 (×18): qty 2000
  Filled 2020-05-02: qty 2
  Filled 2020-05-02: qty 2000
  Filled 2020-05-02: qty 2
  Filled 2020-05-02 (×4): qty 2000
  Filled 2020-05-02: qty 2
  Filled 2020-05-02 (×3): qty 2000
  Filled 2020-05-02: qty 2
  Filled 2020-05-02 (×3): qty 2000

## 2020-05-02 MED ORDER — DEXAMETHASONE SODIUM PHOSPHATE 10 MG/ML IJ SOLN
INTRAMUSCULAR | Status: DC | PRN
  Administered 2020-05-02: 4 mg via INTRAVENOUS

## 2020-05-02 MED ORDER — FENTANYL CITRATE (PF) 100 MCG/2ML IJ SOLN
25.0000 ug | INTRAMUSCULAR | Status: DC | PRN
  Administered 2020-05-02: 25 ug via INTRAVENOUS
  Administered 2020-05-02: 50 ug via INTRAVENOUS

## 2020-05-02 MED ORDER — ONDANSETRON HCL 4 MG/2ML IJ SOLN
INTRAMUSCULAR | Status: DC | PRN
  Administered 2020-05-02: 4 mg via INTRAVENOUS

## 2020-05-02 MED ORDER — AMIODARONE HCL 200 MG PO TABS
400.0000 mg | ORAL_TABLET | Freq: Two times a day (BID) | ORAL | Status: DC
  Administered 2020-05-02 – 2020-05-09 (×14): 400 mg via ORAL
  Filled 2020-05-02 (×15): qty 2

## 2020-05-02 MED ORDER — OXYCODONE HCL 5 MG PO TABS
5.0000 mg | ORAL_TABLET | ORAL | Status: DC | PRN
  Administered 2020-05-03 – 2020-05-13 (×16): 5 mg via ORAL
  Filled 2020-05-02 (×16): qty 1

## 2020-05-02 MED ORDER — INSULIN GLARGINE 100 UNIT/ML ~~LOC~~ SOLN
5.0000 [IU] | Freq: Two times a day (BID) | SUBCUTANEOUS | Status: DC
  Administered 2020-05-02 – 2020-05-03 (×2): 5 [IU] via SUBCUTANEOUS
  Filled 2020-05-02 (×3): qty 0.05

## 2020-05-02 MED ORDER — SODIUM CHLORIDE 0.9 % IV SOLN: INTRAVENOUS | Status: DC | PRN

## 2020-05-02 MED ORDER — FENTANYL CITRATE (PF) 100 MCG/2ML IJ SOLN
INTRAMUSCULAR | Status: DC | PRN
  Administered 2020-05-02 (×5): 50 ug via INTRAVENOUS

## 2020-05-02 MED ORDER — PHENYLEPHRINE 40 MCG/ML (10ML) SYRINGE FOR IV PUSH (FOR BLOOD PRESSURE SUPPORT)
PREFILLED_SYRINGE | INTRAVENOUS | Status: AC
  Filled 2020-05-02: qty 10

## 2020-05-02 MED ORDER — PHENYLEPHRINE 40 MCG/ML (10ML) SYRINGE FOR IV PUSH (FOR BLOOD PRESSURE SUPPORT)
PREFILLED_SYRINGE | INTRAVENOUS | Status: DC | PRN
  Administered 2020-05-02 (×2): 80 ug via INTRAVENOUS
  Administered 2020-05-02: 120 ug via INTRAVENOUS

## 2020-05-02 MED ORDER — OXYCODONE HCL ER 10 MG PO T12A
10.0000 mg | EXTENDED_RELEASE_TABLET | Freq: Two times a day (BID) | ORAL | Status: DC
  Administered 2020-05-02 – 2020-05-14 (×24): 10 mg via ORAL
  Filled 2020-05-02 (×25): qty 1

## 2020-05-02 MED ORDER — SODIUM CHLORIDE 0.9% FLUSH
10.0000 mL | Freq: Two times a day (BID) | INTRAVENOUS | Status: DC
  Administered 2020-05-02 – 2020-05-12 (×11): 10 mL
  Administered 2020-05-13: 20 mL
  Administered 2020-05-14: 10 mL

## 2020-05-02 MED ORDER — ACETAMINOPHEN 325 MG PO TABS: 650.0000 mg | ORAL_TABLET | Freq: Four times a day (QID) | ORAL | Status: DC | PRN

## 2020-05-02 MED ORDER — ONDANSETRON HCL 4 MG/2ML IJ SOLN: 4.0000 mg | Freq: Once | INTRAMUSCULAR | Status: DC | PRN

## 2020-05-02 MED ORDER — ENSURE ENLIVE PO LIQD
237.0000 mL | Freq: Three times a day (TID) | ORAL | Status: DC
  Administered 2020-05-02 – 2020-05-14 (×33): 237 mL via ORAL

## 2020-05-02 MED ORDER — DEXAMETHASONE SODIUM PHOSPHATE 10 MG/ML IJ SOLN
INTRAMUSCULAR | Status: AC
  Filled 2020-05-02: qty 1

## 2020-05-02 MED ORDER — METHOCARBAMOL 500 MG PO TABS: 500.0000 mg | ORAL_TABLET | Freq: Four times a day (QID) | ORAL | Status: DC | PRN

## 2020-05-02 MED ORDER — MIDAZOLAM HCL 2 MG/2ML IJ SOLN
INTRAMUSCULAR | Status: AC
  Filled 2020-05-02: qty 2

## 2020-05-02 MED ORDER — METOCLOPRAMIDE HCL 5 MG/ML IJ SOLN
5.0000 mg | Freq: Three times a day (TID) | INTRAMUSCULAR | Status: DC | PRN
  Administered 2020-05-02: 10 mg via INTRAVENOUS
  Filled 2020-05-02: qty 2

## 2020-05-02 MED ORDER — SODIUM CHLORIDE 0.9% IV SOLUTION: Freq: Once | INTRAVENOUS | Status: DC

## 2020-05-02 MED ORDER — EPHEDRINE 5 MG/ML INJ
INTRAVENOUS | Status: AC
  Filled 2020-05-02: qty 10

## 2020-05-02 MED ORDER — LEVOTHYROXINE SODIUM 25 MCG PO TABS
25.0000 ug | ORAL_TABLET | Freq: Every day | ORAL | Status: DC
  Administered 2020-05-03 – 2020-05-14 (×12): 25 ug via ORAL
  Filled 2020-05-02 (×12): qty 1

## 2020-05-02 MED ORDER — ONDANSETRON HCL 4 MG/2ML IJ SOLN
INTRAMUSCULAR | Status: AC
  Filled 2020-05-02: qty 2

## 2020-05-02 MED ORDER — TRANEXAMIC ACID 1000 MG/10ML IV SOLN
INTRAVENOUS | Status: DC | PRN
  Administered 2020-05-02 (×2): 2000 mg via TOPICAL

## 2020-05-02 MED ORDER — CHLORHEXIDINE GLUCONATE 0.12 % MT SOLN
15.0000 mL | Freq: Once | OROMUCOSAL | Status: AC
  Administered 2020-05-02: 15 mL via OROMUCOSAL

## 2020-05-02 MED ORDER — ALBUMIN HUMAN 5 % IV SOLN
INTRAVENOUS | Status: AC
  Administered 2020-05-02: 12.5 g
  Filled 2020-05-02: qty 250

## 2020-05-02 MED ORDER — LIDOCAINE 2% (20 MG/ML) 5 ML SYRINGE
INTRAMUSCULAR | Status: AC
  Filled 2020-05-02: qty 5

## 2020-05-02 MED ORDER — INSULIN ASPART 100 UNIT/ML ~~LOC~~ SOLN
0.0000 [IU] | Freq: Three times a day (TID) | SUBCUTANEOUS | Status: DC
  Administered 2020-05-03: 11 [IU] via SUBCUTANEOUS
  Administered 2020-05-03: 7 [IU] via SUBCUTANEOUS
  Administered 2020-05-03: 4 [IU] via SUBCUTANEOUS
  Administered 2020-05-04: 7 [IU] via SUBCUTANEOUS
  Administered 2020-05-04: 4 [IU] via SUBCUTANEOUS
  Administered 2020-05-04: 7 [IU] via SUBCUTANEOUS
  Administered 2020-05-05: 3 [IU] via SUBCUTANEOUS
  Administered 2020-05-05: 7 [IU] via SUBCUTANEOUS
  Administered 2020-05-06 (×2): 3 [IU] via SUBCUTANEOUS
  Administered 2020-05-06: 4 [IU] via SUBCUTANEOUS
  Administered 2020-05-07: 3 [IU] via SUBCUTANEOUS
  Administered 2020-05-07 – 2020-05-08 (×2): 4 [IU] via SUBCUTANEOUS
  Administered 2020-05-08 (×2): 3 [IU] via SUBCUTANEOUS
  Administered 2020-05-09: 4 [IU] via SUBCUTANEOUS
  Administered 2020-05-09 (×2): 3 [IU] via SUBCUTANEOUS
  Administered 2020-05-10: 4 [IU] via SUBCUTANEOUS
  Administered 2020-05-10 (×2): 3 [IU] via SUBCUTANEOUS
  Administered 2020-05-11: 4 [IU] via SUBCUTANEOUS
  Administered 2020-05-11: 3 [IU] via SUBCUTANEOUS
  Administered 2020-05-11: 4 [IU] via SUBCUTANEOUS
  Administered 2020-05-12: 3 [IU] via SUBCUTANEOUS
  Administered 2020-05-12: 7 [IU] via SUBCUTANEOUS
  Administered 2020-05-13: 3 [IU] via SUBCUTANEOUS
  Administered 2020-05-13 – 2020-05-14 (×2): 4 [IU] via SUBCUTANEOUS

## 2020-05-02 MED ORDER — MIDAZOLAM HCL 5 MG/5ML IJ SOLN
INTRAMUSCULAR | Status: DC | PRN
  Administered 2020-05-02: 2 mg via INTRAVENOUS

## 2020-05-02 MED ORDER — SODIUM CHLORIDE 0.9 % IV SOLN
2.0000 g | Freq: Two times a day (BID) | INTRAVENOUS | Status: DC
  Administered 2020-05-03 – 2020-05-14 (×23): 2 g via INTRAVENOUS
  Filled 2020-05-02 (×23): qty 20

## 2020-05-02 MED ORDER — SODIUM CHLORIDE 0.9% FLUSH
10.0000 mL | INTRAVENOUS | Status: DC | PRN
  Administered 2020-05-04 – 2020-05-06 (×2): 10 mL

## 2020-05-02 MED ORDER — TRANEXAMIC ACID 1000 MG/10ML IV SOLN
2000.0000 mg | INTRAVENOUS | Status: DC
  Filled 2020-05-02: qty 20

## 2020-05-02 MED ORDER — METOCLOPRAMIDE HCL 5 MG PO TABS: 5.0000 mg | ORAL_TABLET | Freq: Three times a day (TID) | ORAL | Status: DC | PRN

## 2020-05-02 MED ORDER — TRANEXAMIC ACID-NACL 1000-0.7 MG/100ML-% IV SOLN
INTRAVENOUS | Status: AC
  Filled 2020-05-02: qty 100

## 2020-05-02 MED ORDER — SODIUM CHLORIDE 0.9 % IR SOLN
Status: DC | PRN
  Administered 2020-05-02 (×2): 3000 mL

## 2020-05-02 MED ORDER — LIDOCAINE 2% (20 MG/ML) 5 ML SYRINGE
INTRAMUSCULAR | Status: DC | PRN
  Administered 2020-05-02: 50 mg via INTRAVENOUS

## 2020-05-02 MED ORDER — PREGABALIN 100 MG PO CAPS
100.0000 mg | ORAL_CAPSULE | Freq: Three times a day (TID) | ORAL | Status: DC
  Administered 2020-05-02 – 2020-05-14 (×35): 100 mg via ORAL
  Filled 2020-05-02 (×35): qty 1

## 2020-05-02 MED ORDER — ACETAMINOPHEN 650 MG RE SUPP: 650.0000 mg | Freq: Four times a day (QID) | RECTAL | Status: DC | PRN

## 2020-05-02 MED ORDER — TRANEXAMIC ACID-NACL 1000-0.7 MG/100ML-% IV SOLN
INTRAVENOUS | Status: DC | PRN
  Administered 2020-05-02: 1000 mg via INTRAVENOUS

## 2020-05-02 MED ORDER — PROPOFOL 10 MG/ML IV BOLUS
INTRAVENOUS | Status: DC | PRN
  Administered 2020-05-02: 150 mg via INTRAVENOUS

## 2020-05-02 MED ORDER — METHOCARBAMOL 1000 MG/10ML IJ SOLN
500.0000 mg | Freq: Four times a day (QID) | INTRAVENOUS | Status: DC | PRN
  Filled 2020-05-02: qty 5

## 2020-05-02 MED ORDER — INSULIN ASPART 100 UNIT/ML ~~LOC~~ SOLN
SUBCUTANEOUS | Status: AC
  Filled 2020-05-02: qty 1

## 2020-05-02 MED ORDER — PROPOFOL 10 MG/ML IV BOLUS
INTRAVENOUS | Status: AC
  Filled 2020-05-02: qty 20

## 2020-05-02 MED ORDER — INSULIN ASPART 100 UNIT/ML ~~LOC~~ SOLN
0.0000 [IU] | Freq: Every day | SUBCUTANEOUS | Status: DC
  Administered 2020-05-02: 3 [IU] via SUBCUTANEOUS

## 2020-05-02 MED ORDER — DOCUSATE SODIUM 100 MG PO CAPS
100.0000 mg | ORAL_CAPSULE | Freq: Two times a day (BID) | ORAL | Status: DC
  Administered 2020-05-02 – 2020-05-04 (×4): 100 mg via ORAL
  Filled 2020-05-02 (×4): qty 1

## 2020-05-02 MED ORDER — CYCLOBENZAPRINE HCL 5 MG PO TABS
5.0000 mg | ORAL_TABLET | Freq: Three times a day (TID) | ORAL | Status: DC | PRN
  Administered 2020-05-08 – 2020-05-10 (×5): 5 mg via ORAL
  Filled 2020-05-02 (×5): qty 1

## 2020-05-02 MED ORDER — POLYETHYLENE GLYCOL 3350 17 G PO PACK: 17.0000 g | PACK | Freq: Every day | ORAL | Status: DC | PRN

## 2020-05-02 MED ORDER — OXYCODONE HCL 5 MG PO TABS: 5.0000 mg | ORAL_TABLET | Freq: Once | ORAL | Status: DC | PRN

## 2020-05-02 MED ORDER — EPHEDRINE SULFATE-NACL 50-0.9 MG/10ML-% IV SOSY
PREFILLED_SYRINGE | INTRAVENOUS | Status: DC | PRN
  Administered 2020-05-02 (×2): 15 mg via INTRAVENOUS
  Administered 2020-05-02 (×2): 10 mg via INTRAVENOUS

## 2020-05-02 MED ORDER — CHLORHEXIDINE GLUCONATE CLOTH 2 % EX PADS
6.0000 | MEDICATED_PAD | Freq: Every day | CUTANEOUS | Status: DC
  Administered 2020-05-03 – 2020-05-14 (×12): 6 via TOPICAL

## 2020-05-02 MED ORDER — BISACODYL 10 MG RE SUPP: 10.0000 mg | Freq: Every day | RECTAL | Status: DC | PRN

## 2020-05-02 MED ORDER — SODIUM CHLORIDE 0.9% FLUSH
3.0000 mL | Freq: Two times a day (BID) | INTRAVENOUS | Status: DC
  Administered 2020-05-04 – 2020-05-08 (×7): 3 mL via INTRAVENOUS

## 2020-05-02 MED ORDER — FE FUMARATE-B12-VIT C-FA-IFC PO CAPS
1.0000 | ORAL_CAPSULE | Freq: Every day | ORAL | Status: DC
  Administered 2020-05-03 – 2020-05-14 (×12): 1 via ORAL
  Filled 2020-05-02 (×12): qty 1

## 2020-05-02 MED ORDER — MAGNESIUM CITRATE PO SOLN
1.0000 | Freq: Once | ORAL | Status: DC | PRN
  Filled 2020-05-02: qty 296

## 2020-05-02 MED ORDER — DOCUSATE SODIUM 100 MG PO CAPS
100.0000 mg | ORAL_CAPSULE | Freq: Two times a day (BID) | ORAL | Status: DC
  Administered 2020-05-02: 100 mg via ORAL
  Filled 2020-05-02: qty 1

## 2020-05-02 MED ORDER — FENTANYL CITRATE (PF) 250 MCG/5ML IJ SOLN
INTRAMUSCULAR | Status: AC
  Filled 2020-05-02: qty 5

## 2020-05-02 MED ORDER — SODIUM CHLORIDE 0.9% IV SOLUTION: Freq: Once | INTRAVENOUS | Status: AC

## 2020-05-02 MED ORDER — OXYCODONE HCL 5 MG/5ML PO SOLN: 5.0000 mg | Freq: Once | ORAL | Status: DC | PRN

## 2020-05-02 MED ORDER — FENTANYL CITRATE (PF) 100 MCG/2ML IJ SOLN
INTRAMUSCULAR | Status: AC
  Filled 2020-05-02: qty 2

## 2020-05-02 MED ORDER — SODIUM CHLORIDE 0.9 % IV SOLN: 2.0000 g | Freq: Two times a day (BID) | INTRAVENOUS | Status: DC

## 2020-05-02 MED ORDER — 0.9 % SODIUM CHLORIDE (POUR BTL) OPTIME
TOPICAL | Status: DC | PRN
  Administered 2020-05-02: 1000 mL

## 2020-05-02 MED ORDER — SODIUM CHLORIDE 0.9 % IV SOLN: INTRAVENOUS | Status: DC

## 2020-05-02 MED ORDER — ONDANSETRON HCL 4 MG/2ML IJ SOLN: 4.0000 mg | Freq: Four times a day (QID) | INTRAMUSCULAR | Status: DC | PRN

## 2020-05-02 MED ORDER — ALBUTEROL SULFATE (2.5 MG/3ML) 0.083% IN NEBU: 2.5000 mg | INHALATION_SOLUTION | Freq: Four times a day (QID) | RESPIRATORY_TRACT | Status: DC | PRN

## 2020-05-02 MED ORDER — SENNA 8.6 MG PO TABS
1.0000 | ORAL_TABLET | Freq: Every day | ORAL | Status: DC
  Administered 2020-05-02 – 2020-05-13 (×11): 8.6 mg via ORAL
  Filled 2020-05-02 (×12): qty 1

## 2020-05-02 MED ORDER — ONDANSETRON HCL 4 MG PO TABS: 4.0000 mg | ORAL_TABLET | Freq: Four times a day (QID) | ORAL | Status: DC | PRN

## 2020-05-02 MED ORDER — SODIUM CHLORIDE 0.9 % IV BOLUS
500.0000 mL | Freq: Once | INTRAVENOUS | Status: AC
  Administered 2020-05-02: 500 mL via INTRAVENOUS

## 2020-05-02 SURGICAL SUPPLY — 41 items
BAG DECANTER FOR FLEXI CONT (MISCELLANEOUS) ×4 IMPLANT
BLADE SURG 21 STRL SS (BLADE) ×4 IMPLANT
BNDG COHESIVE 6X5 TAN STRL LF (GAUZE/BANDAGES/DRESSINGS) IMPLANT
BNDG GAUZE ELAST 4 BULKY (GAUZE/BANDAGES/DRESSINGS) IMPLANT
CNTNR URN SCR LID CUP LEK RST (MISCELLANEOUS) ×2 IMPLANT
CONT SPEC 4OZ STRL OR WHT (MISCELLANEOUS) ×2
COVER SURGICAL LIGHT HANDLE (MISCELLANEOUS) ×4 IMPLANT
DRAPE BILATERAL LIMB T (DRAPES) ×2 IMPLANT
DRAPE INCISE IOBAN 66X45 STRL (DRAPES) ×6 IMPLANT
DRAPE U-SHAPE 47X51 STRL (DRAPES) ×4 IMPLANT
DRSG ADAPTIC 3X8 NADH LF (GAUZE/BANDAGES/DRESSINGS) IMPLANT
DURAPREP 26ML APPLICATOR (WOUND CARE) ×4 IMPLANT
ELECT REM PT RETURN 9FT ADLT (ELECTROSURGICAL) ×2
ELECTRODE REM PT RTRN 9FT ADLT (ELECTROSURGICAL) ×1 IMPLANT
GAUZE SPONGE 4X4 12PLY STRL (GAUZE/BANDAGES/DRESSINGS) IMPLANT
GLOVE BIOGEL PI IND STRL 9 (GLOVE) ×1 IMPLANT
GLOVE BIOGEL PI INDICATOR 9 (GLOVE) ×1
GLOVE SURG ORTHO 9.0 STRL STRW (GLOVE) ×4 IMPLANT
GOWN STRL REUS W/ TWL XL LVL3 (GOWN DISPOSABLE) ×2 IMPLANT
GOWN STRL REUS W/TWL XL LVL3 (GOWN DISPOSABLE) ×2
HANDPIECE INTERPULSE COAX TIP (DISPOSABLE) ×1
KIT BASIN OR (CUSTOM PROCEDURE TRAY) ×2 IMPLANT
KIT TURNOVER KIT B (KITS) ×2 IMPLANT
MANIFOLD NEPTUNE II (INSTRUMENTS) ×2 IMPLANT
NS IRRIG 1000ML POUR BTL (IV SOLUTION) ×2 IMPLANT
PACK ORTHO EXTREMITY (CUSTOM PROCEDURE TRAY) ×2 IMPLANT
PAD ARMBOARD 7.5X6 YLW CONV (MISCELLANEOUS) ×4 IMPLANT
SET HNDPC FAN SPRY TIP SCT (DISPOSABLE) ×1 IMPLANT
SPONGE LAP 18X18 RF (DISPOSABLE) ×6 IMPLANT
STAPLER VISISTAT 35W (STAPLE) ×2 IMPLANT
STOCKINETTE IMPERVIOUS 9X36 MD (GAUZE/BANDAGES/DRESSINGS) ×4 IMPLANT
SUT ETHILON 2 0 PSLX (SUTURE) ×2 IMPLANT
SUT VIC AB 1 CT1 27 (SUTURE) ×2
SUT VIC AB 1 CT1 27XBRD ANBCTR (SUTURE) ×2 IMPLANT
SWAB COLLECTION DEVICE MRSA (MISCELLANEOUS) IMPLANT
SWAB CULTURE ESWAB REG 1ML (MISCELLANEOUS) IMPLANT
SYR 50ML SLIP (SYRINGE) ×2 IMPLANT
TOWEL GREEN STERILE (TOWEL DISPOSABLE) ×2 IMPLANT
TOWEL GREEN STERILE FF (TOWEL DISPOSABLE) ×2 IMPLANT
TUBE CONNECTING 12X1/4 (SUCTIONS) ×2 IMPLANT
YANKAUER SUCT BULB TIP NO VENT (SUCTIONS) ×2 IMPLANT

## 2020-05-02 NOTE — Progress Notes (Signed)
During shift change/transfer to floor patient's left knee wound vac was found to be not charged. Wife of patient noticed this when he was transferred. Visible blood under tegaderm dressing. Upon examining there was a leak in the tegaderm and a clot in the line to the canister. Was able to fix seal and unclog line. Within one hour the canister filled up (150 mL). Ordered new canister and will watch for amount of drainage. Patient stable and 1 unit blood was completed.

## 2020-05-02 NOTE — Progress Notes (Addendum)
Report ghiven to receiving RN. Transfered patient per bed with NT/RN/. Wife in room.

## 2020-05-02 NOTE — Progress Notes (Signed)
Patient's wife requesting to stay the night with patient. She says her husband has anxiety when she is not there and that she has been staying the night with him throughout his last 6 weeks of hospitalization. AC said it was okay for her to stay the night with him while he is here.

## 2020-05-02 NOTE — Op Note (Signed)
05/02/2020  9:31 AM  PATIENT:  Johnathan Murray    PRE-OPERATIVE DIAGNOSIS:  Bilateral septic knees  POST-OPERATIVE DIAGNOSIS:  Same  PROCEDURE: Total SYNOVECTOMY, IRRIGATION AND DEBRIDEMENT BILATERAL KNEES Application of Prevena arthroform wound vacs x2  SURGEON:  Newt Minion, MD  PHYSICIAN ASSISTANT:None ANESTHESIA:   General  PREOPERATIVE INDICATIONS:  Johnathan Murray is a  69 y.o. male with a diagnosis of Bilateral septic knees who failed conservative measures and elected for surgical management.    The risks benefits and alternatives were discussed with the patient preoperatively including but not limited to the risks of infection, bleeding, nerve injury, cardiopulmonary complications, the need for revision surgery, among others, and the patient was willing to proceed.  OPERATIVE IMPLANTS: Worthy Keeler form wound vacs x2  @ENCIMAGES @  OPERATIVE FINDINGS: Abscess and extensive inflammation of the capsule bilateral knees left worse than right.  Tissue and fluid sent for cultures from both knees.  OPERATIVE PROCEDURE: Patient was brought the operating room and underwent a general anesthetic.  After adequate levels anesthesia were obtained patient's both lower extremities were prepped using DuraPrep draped into a sterile field Ioban was used to cover all exposed skin.  Attention was first focused on the left knee.  A midline incision was made carried down to the medial parapatellar retinacular incision.  There is purulent drainage this was drained.  Patient had extensive inflammation of the capsule.  Patient underwent a total capsulectomy and this tissue was sent for cultures electrocardio used for hemostasis the wound was irrigated with 3 L of normal saline the joint was filled with TXA closed with towel clips and then attention was then focused to the right lower extremity.  The right lower extremity underwent a midline incision of the right knee this is carried down to  the medial parapatellar retinacular incision.  There is a joint effusion but not as bad as the left.  Patient also had extensive synovitis of the capsule and a total capsulectomy was performed of the right knee as well the tissue and fluid was sent for cultures electrocautery was used hemostasis the wound was irrigated with 3 L normal saline and then the joint was soaked in TXA.  Attention was then focused back to the left knee.  The wound was irrigated again with pulsatile lavage.  The joint was then again soaked in TXA the retinaculum was closed with #1 Vicryl the skin was closed using staples and a Praveena Arthur form wound VAC was applied this had a good suction fit.  Attention was then focused on the right lower extremity the knee was again irrigated with pulsatile lavage the knee was then again soaked in TXA the retinaculum was closed with #1 Vicryl the skin was closed using staples a Praveena Arthur form wound VAC was applied this had a good suction fit patient was extubated taken the PACU in stable condition.  Tissue cultures from both knees were sent in separate containers.   DISCHARGE PLANNING:  Antibiotic duration: Continue IV antibiotics anticipate 6 weeks antibiotics from the time of this surgery  Weightbearing: Weightbearing as tolerated bilateral lower extremitys no restrictions with range of motion or weightbearing  Pain medication: Continue current pain regimen.  Dressing care/ Wound VAC: Continue wound vacs for a total of 2 weeks  Ambulatory devices: Walker  Discharge to: Back to inpatient rehab.  Follow-up: In the office 1 week post operative.

## 2020-05-02 NOTE — Consult Note (Signed)
ORTHOPAEDIC CONSULTATION  REQUESTING PHYSICIAN: Courtney Heys, MD  Chief Complaint: Painful recurrent effusions bilateral knees.  HPI: Johnathan Murray is a 69 y.o. male who presents with recent history of septic arthritis both knees he has undergone irrigation and debridement and has massive recurrent effusion of both knees.  He has a white cell count of 100,000 elevated C-reactive protein.  Complains of painful hard effusion of the left knee and less painful hard effusion of the right knee.  Past Medical History:  Diagnosis Date  . Abnormal LFTs (liver function tests) 10/31/2016  . Abnormal nuclear stress test 05/03/2018  . Acid reflux 02/15/2018   not at present  . Acute otitis externa of left ear 11/13/2019  . Acute suppurative otitis media of left ear without spontaneous rupture of tympanic membrane 11/13/2019  . Anemia    low iron  . Aortic stenosis 05/03/2018  . Aortic valve abscess 04/13/2020  . ARF (acute renal failure) ([place]) 03/20/2020  . Arthritis 02/15/2018   Overview:  shoulder  . Bacteremia due to Enterococcus 03/21/2020  . Cervical myelopathy ([place]) 04/22/2019  . Conductive hearing loss of left ear 11/13/2019  . Coronary artery disease involving native coronary artery of native heart without angina pectoris 05/30/2016  . Coronary artery disease of native artery of native heart with stable angina pectoris ([place])    pt. denies  . Diabetes mellitus due to underlying condition with unspecified complications ([place]) 3/41/9622  . Discitis 04/07/2020  . Dyslipidemia 05/30/2016  . Dyspnea    upon exertion,  as of 06/05/09 not having any recent chest pain  . Dysrhythmia    a fib  . Endocarditis 04/13/2020  . Essential hypertension 05/30/2016  . Heart murmur    Has had an Aortic valve replacement  . History of fusion of cervical spine 03/23/2020  . Hyperactive gag reflex 02/15/2018  . Hypothyroidism   . Intermittent claudication ([place]) 10/31/2018  . Lumbar stenosis with neurogenic  claudication 06/11/2019  . Macular degeneration 02/15/2018  . PAF (paroxysmal atrial fibrillation) ([place]) 05/30/2016  . Pre-operative cardiovascular examination 01/03/2020  . Preoperative cardiovascular examination 04/17/2019  . Prosthetic valve endocarditis ([place]) 04/13/2020  . Protein-calorie malnutrition, severe 03/24/2020  . Pseudogout of left knee 03/21/2020  . Renal insufficiency 01/17/2020  . S/P aortic valve replacement 01/17/2020  . S/P AVR 01/17/2020  . Septic arthritis ([place]) 03/23/2020  . Tinnitus of left ear 11/13/2019  . Typical atrial flutter [facility])    Past Surgical History:  Procedure Laterality Date  . ANTERIOR CERVICAL DECOMP/DISCECTOMY FUSION N/A 04/22/2019   Procedure: Anterior Cervical Discectomy and Fusion, Cervical four-five, Cervical five-six;  Surgeon: Earnie Larsson, MD;  Location: [place];  Service: Neurosurgery;  Laterality: N/A;  . ANTERIOR CERVICAL DECOMP/DISCECTOMY FUSION N/A 03/02/2020   Procedure: Anterior Cervical Decompression Fusion  - Cervical three-Cervical four;  Surgeon: Earnie Larsson, MD;  Location: [place];  Service: Neurosurgery;  Laterality: N/A;  . AORTIC VALVE REPLACEMENT    . ASCENDING AORTIC ROOT REPLACEMENT N/A 04/16/2020   Procedure: ASCENDING AORTIC ROOT REPLACEMENT - HOMOGRAFT using [place] 23 MM Cryo Aortic Valve.;  Surgeon: Gaye Pollack, MD;  Location: [facility] OR;  Service: Open Heart Surgery;  Laterality: N/A;  . CARDIAC CATHETERIZATION     several stents  . CARDIAC SURGERY    . CARDIOVERSION N/A 04/13/2020   Procedure: CARDIOVERSION;  Surgeon: Geralynn Rile, MD;  Location: [place];  Service: Cardiovascular;  Laterality: N/A;  . CARDIOVERSION N/A 04/29/2020   Procedure: CARDIOVERSION;  Surgeon: Skeet Latch, MD;  Location: [facility] ENDOSCOPY;  Service: Cardiovascular;  Laterality: N/A;  . CORONARY ARTERY BYPASS GRAFT  2017   patient unsure how many, had at [facility]  . HAND SURGERY    . IR LUMBAR [place] W/IMG GUIDE   04/07/2020  . IRRIGATION AND DEBRIDEMENT KNEE Bilateral 03/24/2020   Procedure: ARTHROSCOPY IRRIGATION AND DEBRIDEMENT BILATERAL  KNEE;  Surgeon: Marchia Bond, MD;  Location: [place];  Service: Orthopedics;  Laterality: Bilateral;  . KNEE SURGERY    . LEFT HEART CATH AND CORONARY ANGIOGRAPHY N/A 01/21/2020   Procedure: LEFT HEART CATH AND CORONARY ANGIOGRAPHY;  Surgeon: Belva Crome, MD;  Location: [place] CV LAB;  Service: Cardiovascular;  Laterality: N/A;  . LEFT HEART CATH AND CORS/GRAFTS ANGIOGRAPHY N/A 05/16/2018   Procedure: LEFT HEART CATH AND CORS/GRAFTS ANGIOGRAPHY;  Surgeon: Burnell Blanks, MD;  Location: [place] CV LAB;  Service: Cardiovascular;  Laterality: N/A;  . LUMBAR LAMINECTOMY/DECOMPRESSION MICRODISCECTOMY Bilateral 06/11/2019   Procedure: Bilateral Lumbar Four-FiveLaminectomy/Foraminotomy;  Surgeon: Earnie Larsson, MD;  Location: [place];  Service: Neurosurgery;  Laterality: Bilateral;  posterior  . TEE WITHOUT CARDIOVERSION N/A 03/26/2020   Procedure: TRANSESOPHAGEAL ECHOCARDIOGRAM (TEE);  Surgeon: Jerline Pain, MD;  Location: [facility] ENDOSCOPY;  Service: Cardiovascular;  Laterality: N/A;  . TEE WITHOUT CARDIOVERSION N/A 04/13/2020   Procedure: TRANSESOPHAGEAL ECHOCARDIOGRAM (TEE);  Surgeon: Geralynn Rile, MD;  Location: [place];  Service: Cardiovascular;  Laterality: N/A;  . TEE WITHOUT CARDIOVERSION N/A 04/16/2020   Procedure: TRANSESOPHAGEAL ECHOCARDIOGRAM (TEE);  Surgeon: Gaye Pollack, MD;  Location: [place];  Service: Open Heart Surgery;  Laterality: N/A;  . TEE WITHOUT CARDIOVERSION N/A 04/29/2020   Procedure: TRANSESOPHAGEAL ECHOCARDIOGRAM (TEE);  Surgeon: Skeet Latch, MD;  Location: [facility] ENDOSCOPY;  Service: Cardiovascular;  Laterality: N/A;   Social History   Socioeconomic History  . Marital status: Married    Spouse name: [name]  . Number of children: 1  . Years of education: Not on file  . Highest education level: GED or equivalent    Occupational History    Employer: [employer]  Tobacco Use  . Smoking status: Never Smoker  . Smokeless tobacco: Never Used  Vaping Use  . Vaping Use: Never used  Substance and Sexual Activity  . Alcohol use: Not Currently  . Drug use: Never  . Sexual activity: Not Currently  Other Topics Concern  . Not on file  Social History Narrative   He drives a truck for [employer].   He lives with wife in a one-level home.   Highest level of education:  GED      Patient is right-handed. There are 8 steps to enter his home.   Social Determinants of Health   Financial Resource Strain:   . Difficulty of Paying Living Expenses:   Food Insecurity:   . Worried About Charity fundraiser in the Last Year:   . Arboriculturist in the Last Year:   Transportation Needs:   . Film/video editor (Medical):   Marland Kitchen Lack of Transportation (Non-Medical):   Physical Activity:   . Days of Exercise per Week:   . Minutes of Exercise per Session:   Stress:   . Feeling of Stress :   Social Connections:   . Frequency of Communication with Friends and Family:   . Frequency of Social Gatherings with Friends and Family:   . Attends Religious Services:   . Active Member of Clubs or Organizations:   .  Attends Archivist Meetings:   Marland Kitchen Marital Status:    Family History  Problem Relation Age of Onset  . Prostate cancer Brother   . Heart disease Maternal Grandmother   . Brain cancer Mother    - negative except otherwise stated in the family history section Allergies  Allergen Reactions  . Atorvastatin Other (See Comments)    Urinary retention   . Nebivolol Diarrhea   Prior to Admission medications   Medication Sig Start Date End Date Taking? Authorizing Provider  acetaminophen (TYLENOL) 325 MG tablet Take 2 tablets (650 mg total) by mouth every 6 (six) hours as needed for moderate pain or fever. 04/13/20   Angiulli, Lavon Paganini, PA-C  amiodarone (PACERONE) 400 MG tablet Take 1  tablet (400 mg total) by mouth 2 (two) times daily for 7 days. 04/30/20 05/07/20  Elgie Collard, PA-C  ampicillin 2 g in sodium chloride 0.9 % 100 mL Inject 2 g into the vein every 4 (four) hours. 04/27/20   Elgie Collard, PA-C  apixaban (ELIQUIS) 5 MG TABS tablet Take 1 tablet (5 mg total) by mouth 2 (two) times daily. 02/04/20   Revankar, Reita Cliche, MD  aspirin EC 81 MG EC tablet Take 1 tablet (81 mg total) by mouth daily. 05/01/20   Barrett, Erin R, PA-C  cefTRIAXone 2 g in sodium chloride 0.9 % 100 mL Inject 2 g into the vein every 12 (twelve) hours. 04/27/20   Elgie Collard, PA-C  cyclobenzaprine (FLEXERIL) 5 MG tablet Take 1 tablet (5 mg total) by mouth 3 (three) times daily as needed for muscle spasms. 04/13/20   Angiulli, Lavon Paganini, PA-C  docusate sodium (COLACE) 100 MG capsule Take 1 capsule (100 mg total) by mouth 2 (two) times daily. 04/07/20   Alma Friendly, MD  ferrous [id number] C-folic acid (TRINSICON / FOLTRIN) capsule Take 1 capsule by mouth daily with breakfast. For one month then stop. 04/23/20   Nani Skillern, PA-C  insulin aspart (NOVOLOG) 100 UNIT/ML injection Inject 0-15 Units into the skin 3 (three) times daily with meals. 04/13/20   Angiulli, Lavon Paganini, PA-C  insulin aspart (NOVOLOG) 100 UNIT/ML injection Inject 3 Units into the skin 3 (three) times daily with meals. 04/13/20   Angiulli, Lavon Paganini, PA-C  insulin glargine (LANTUS) 100 UNIT/ML injection Inject 0.1 mLs (10 Units total) into the skin 2 (two) times daily. 04/13/20   Angiulli, Lavon Paganini, PA-C  levothyroxine (SYNTHROID) 25 MCG tablet Take 25 mcg by mouth daily before breakfast.  01/23/20   [provider]  metoprolol tartrate (LOPRESSOR) 25 MG tablet Take 1 tablet (25 mg total) by mouth 2 (two) times daily. 04/30/20   Barrett, Lodema Hong, PA-C  oxyCODONE (OXYCONTIN) 10 mg 12 hr tablet Take 1 tablet (10 mg total) by mouth every 12 (twelve) hours. 04/13/20   Angiulli, Lavon Paganini, PA-C  pregabalin (LYRICA) 100 MG  capsule Take 1 capsule (100 mg total) by mouth 3 (three) times daily. 04/13/20   Angiulli, Lavon Paganini, PA-C  senna (SENOKOT) 8.6 MG TABS tablet Take 1 tablet (8.6 mg total) by mouth at bedtime. 04/07/20   Alma Friendly, MD  vitamin C (ASCORBIC ACID) 500 MG tablet Take 500 mg by mouth daily.     [provider]   MR KNEE RIGHT WO CONTRAST  Result Date: 05/01/2020 CLINICAL DATA:  Bilateral knee pain and swelling. Previous bilateral knee irrigation and debridement on 03/22/2020. Bilateral knee aspiration today. Concern for septic arthritis. EXAM:  MRI OF THE RIGHT KNEE WITHOUT CONTRAST TECHNIQUE: Multiplanar, multisequence MR imaging of the knee was performed. No intravenous contrast was administered. COMPARISON:  None. FINDINGS: MENISCI Medial meniscus:  Intact with normal morphology. Lateral meniscus: Mild degeneration of the anterior horn. No evidence of meniscal tear. LIGAMENTS Cruciates: Severe ACL mucoid degeneration without evidence of acute ligamentous injury. The PCL is intact. Collaterals:  Intact. CARTILAGE Patellofemoral: Severe patellofemoral degenerative changes with diffuse chondral thinning and osteophyte formation. Medial: Mild chondral thinning with peripheral osteophytes and subchondral edema. Lateral: Moderate chondral thinning with surface irregularity, osteophytes and peripheral subchondral edema in the lateral femoral condyle. There is a small peripheral osteochondral lesion which appears chronic. No evidence of erosive disease. MISCELLANEOUS Joint: Moderate to large complex joint effusion with heterogeneous signal, most consistent with synovitis. Popliteal Fossa: There is complex fluid within the popliteus hiatus. No significant Baker's cyst. Extensor Mechanism:  Intact. Bones: No evidence of acute fracture or dislocation. Scattered arthropathic changes as described without bone destruction or generalized periarticular edema to suggest septic arthritis. Other: Subcutaneous edema  posterolaterally. No focal fluid collection. IMPRESSION: 1. Moderate to large complex joint effusion with heterogeneous signal, most consistent with synovitis. Associated complex fluid in the popliteus hiatus. 2. No specific evidence of osteomyelitis. 3. Tricompartmental degenerative changes, severe at the patellofemoral compartment. No acute osseous findings. 4. Severe ACL mucoid degeneration without evidence of acute ligamentous injury. Electronically Signed   By: Richardean Sale M.D.   On: 05/01/2020 08:15   MR KNEE LEFT WO CONTRAST  Result Date: 05/01/2020 CLINICAL DATA:  Bilateral knee pain and swelling. Previous bilateral knee irrigation and debridement on 03/22/2020. Bilateral knee aspiration today. Concern for septic arthritis. EXAM: MRI OF THE LEFT KNEE WITHOUT CONTRAST TECHNIQUE: Multiplanar, multisequence MR imaging of the knee was performed. No intravenous contrast was administered. COMPARISON:  Radiographs 03/21/2020 FINDINGS: MENISCI Medial meniscus: Irregular degenerative tear of the posterior horn, involving the meniscal root. There is resulting peripheral extrusion of the meniscus from the joint space. No centrally displaced meniscal fragment identified. Lateral meniscus: There is a radial tear of the posterior horn, best seen on the coronal images. No centrally displaced meniscal fragment. The meniscus is partially extruded peripherally from the joint. LIGAMENTS Cruciates: Poorly defined anterior cruciate ligament, likely due to advanced ACL mucoid degeneration. Integrity of the ligament is difficult to confirm. There is also probable mucoid degeneration of the PCL. No acute ligamentous findings. Collaterals: Intact. Moderate fluid in the medial collateral ligament bursa. CARTILAGE Patellofemoral: Advanced patellofemoral arthropathy with diffuse joint space narrowing and osteophytes. Medial: Diffuse chondral thinning, surface irregularity and osteophyte formation. Scattered nonspecific edema  and subchondral cyst formation without focal erosions or bone destruction. Lateral: Diffuse chondral thinning, surface irregularity and osteophyte formation. Scattered nonspecific subchondral cyst formation and edema in the lateral femoral condyle. No bone destruction. MISCELLANEOUS Joint: Large complex joint effusion with diffuse synovial irregularity suspicious for synovitis. Popliteal Fossa: There is complex fluid in the popliteus hiatus. There is a small Baker's cyst. Extensor Mechanism:  Intact. Bones: Nonspecific marrow changes as described above, consistent with underlying arthropathy/synovitis. No bone destruction or generalized periarticular edema to suggest septic arthritis or osteomyelitis. Other: Mild soft tissue edema posterolaterally without other focal fluid collection. IMPRESSION: 1. Large complex joint effusion with diffuse synovial irregularity suspicious for synovitis, nonspecific in etiology. Based on prior radiographs and aspiration results, findings could be secondary to CPPD arthropathy. 2. No specific evidence of septic arthritis or osteomyelitis. 3. Complex degenerative tear of the posterior horn of the  medial meniscus, involving the meniscal root. Radial tear of the posterior horn of the lateral meniscus. 4. Advanced ACL mucoid degeneration and probable mucoid degeneration of the PCL. 5. Moderate medial collateral ligament bursitis. Electronically Signed   By: Richardean Sale M.D.   On: 05/01/2020 08:24   - pertinent xrays, CT, MRI studies were reviewed and independently interpreted  Positive ROS: All other systems have been reviewed and were otherwise negative with the exception of those mentioned in the HPI and as above.  Physical Exam: General: Alert, no acute distress Psychiatric: Patient is competent for consent with normal mood and affect Lymphatic: No axillary or cervical lymphadenopathy Cardiovascular: No pedal edema Respiratory: No cyanosis, no use of accessory  musculature GI: No organomegaly, abdomen is soft and non-tender    Images:  @ENCIMAGES @  Labs:  Lab Results  Component Value Date   HGBA1C 6.8 (H) 02/24/2020   HGBA1C 6.0 (H) 04/18/2019   ESRSEDRATE 118 (H) 04/05/2020   CRP 12.4 (H) 04/05/2020   REPTSTATUS PENDING 04/30/2020   GRAMSTAIN  04/30/2020    ABUNDANT WBC PRESENT, PREDOMINANTLY PMN NO ORGANISMS SEEN    CULT  04/30/2020    NO GROWTH < 24 HOURS Performed at [facility] [address], [place], [place] [address]    LABORGA ENTEROCOCCUS FAECALIS 04/07/2020    Lab Results  Component Value Date   ALBUMIN 2.0 (L) 05/01/2020   ALBUMIN 2.0 (L) 04/15/2020   ALBUMIN 1.9 (L) 04/09/2020    Neurologic: Patient does not have protective sensation bilateral lower extremities.   MUSCULOSKELETAL:   Skin: Examination patient has a tense effusion of the left and right knee.  There is more tenderness to palpation around the left knee than the right knee he has limited range of motion of both knees.  Patient appears to have consolidation of the effusion of the left knee with hard mass involving the suprapatellar pouch worse on the left knee than the right knee.  Patient's most recent sed rate was greater than 100 and CRP greater than 10.  Hemoglobin A1c well controlled at 6.8 with severe protein caloric malnutrition with an albumin 2.0.  Review of the MRI scan of both knees shows consolidation of a massive amount of swelling in both knees with bony changes of the distal femur and proximal tibia possibly consistent with osteomyelitis.  Assessment: Recurrent effusion both knees with history of septic arthritis and bony changes consistent with possible osteomyelitis with severe protein caloric malnutrition with an albumin of 2.0.  Plan: Plan: Discussed that the best way to try to resolve this recurrent infection of both knees would be a total synovectomy of both knees followed by 6 weeks further IV antibiotics.  Risks and  benefits were discussed including persistent infection of the bone need for additional surgery.  Patient states he understands wished to proceed with surgery on both knees.  Thank you for the consult and the opportunity to see Johnathan Murray, [place] [phone] 7:35 AM

## 2020-05-02 NOTE — Progress Notes (Signed)
   05/02/20 1323  Assess: MEWS Score  Temp 97.9 F (36.6 C)  BP (!) 72/53  Pulse Rate (!) 103  Resp 20  SpO2 100 %  O2 Device Room Air  Assess: MEWS Score  MEWS Temp 0  MEWS Systolic 2  MEWS Pulse 1  MEWS RR 0  MEWS LOC 0  MEWS Score 3  MEWS Score Color Yellow  Assess: if the MEWS score is Yellow or Red  Were vital signs taken at a resting state? Yes  Focused Assessment Documented focused assessment  Early Detection of Sepsis Score *See Row Information* Medium  MEWS guidelines implemented *See Row Information* Yes  Treat  MEWS Interventions Other (Comment) (MD notified)  Take Vital Signs  Increase Vital Sign Frequency  Yellow: Q 2hr X 2 then Q 4hr X 2, if remains yellow, continue Q 4hrs  Escalate  MEWS: Escalate Yellow: discuss with charge nurse/RN and consider discussing with provider and RRT  Notify: Charge Nurse/RN  Name of Charge Nurse/RN Notified Genene Churn  Date Charge Nurse/RN Notified 05/02/20  Time Charge Nurse/RN Notified 1400  Notify: Provider  Provider Name/Title Naaman Plummer  Date Provider Notified 05/02/20  Time Provider Notified 1340  Notification Type Call  Response Other (Comment) (recommended to call DR. Sharol Given)  Date of Provider Response 05/02/20  Time of Provider Response 1345  Document  Patient Outcome Other (Comment)

## 2020-05-02 NOTE — H&P (Addendum)
History and Physical    Kamarie Palma KDX:[id number] DOB: 1950-11-28 DOA: 04/30/2020  Referring MD/NP/PA: Marchia Bond, MD PCP: Nicoletta Dress, MD  Patient coming from: Rehab Transfer   Chief Complaint: Weakness  I have personally briefly reviewed patient's old medical records in [place]   HPI: Matthewjames Petrasek is a 69 y.o. male with medical history significant of PAF on Eliquis, HTN, bioprosthetic aortic valve replacement in 2017, CAD, and IDDM.  He had presented to the hospital on 03/19/2020 with low-grade fevers weakness of the lower extremities.  He previously underwent extensive diagnostic evaluation which revealed signs of acute osteomyelitis discitis at L4-5 with soft tissue infection infection of the psoas muscles with signs small abscesses.  He had been placed on broad-spectrum antibiotics.  Neurosurgery was consulted and aspirated the disc space.  His initial TEE showed no obvious vegetation.  However, patient developed bilateral knee pain and swelling and x-ray showed diffuse degenerative changes as well as large joint effusion, and joint aspiration on 03/21/2020 and right lower extremity joint aspiration on 03/22/2020 both showing intracellular calcium pyrophosphate crystals with white blood cell count up to 15,000 and culture grew rare Enterococcus faecalis.   During repeat TEE on 5/24 patient was found  to have a newly developed aortic root abscess with vegetations approximately 5 mm x 8 mm on the leaflet of RCC of prosthetic valve.  Cardiothoracic surgery consultation was obtained.  He underwent a redo of the median sternotomy, right axillary artery graft for cardiopulmonary bypass, extracorporal circulation, debridement of aortic root abscess and pseudoaneurysm, and ARR (using a 26mm Homograft aortic root with reimplantation of coronary arteries) on 04/17/2020.  Infectious disease have been following the patient and had a PICC line placed on 5/9.  He set to continue  ampicillin through 05/20/2020.  He had been discharged back to inpatient rehab on 6/10 to continue with therapies. Records note that patient's hemoglobin however had been slowly trending down 7.4 on 6/11.   However, today the patient had been taken to the OR by Dr. Sharol Given for recurrent bilateral knee effusions.  Question possibility of osteomyelitis he underwent total synovectomy of both knees.  Notes revealed patient recommended to have continued antibiotics for 6 weeks.  Following the procedure patient noted to have low blood pressures into the 70s and was in atrial fibrillation.  TRH called to admit as did not feel comfortable with the patient receiving blood transfusions on the rehab floor.  Patient denied having any complaints besides bilateral knee pain.   ED Course: As seen above  Review of Systems  Respiratory: Negative for shortness of breath.   Cardiovascular: Negative for chest pain.  Musculoskeletal: Positive for joint pain and myalgias.  All other systems reviewed and are negative.    Past Medical History:  Diagnosis Date  . Abnormal LFTs (liver function tests) 10/31/2016  . Abnormal nuclear stress test 05/03/2018  . Acid reflux 02/15/2018   not at present  . Acute otitis externa of left ear 11/13/2019  . Acute suppurative otitis media of left ear without spontaneous rupture of tympanic membrane 11/13/2019  . Anemia    low iron  . Aortic stenosis 05/03/2018  . Aortic valve abscess 04/13/2020  . ARF (acute renal failure) ([place]) 03/20/2020  . Arthritis 02/15/2018   Overview:  shoulder  . Bacteremia due to Enterococcus 03/21/2020  . Cervical myelopathy ([place]) 04/22/2019  . Conductive hearing loss of left ear 11/13/2019  . Coronary artery disease involving native coronary artery of  native heart without angina pectoris 05/30/2016  . Coronary artery disease of native artery of native heart with stable angina pectoris ([place])    pt. denies  . Diabetes mellitus due to underlying condition with  unspecified complications ([place]) 0/27/7412  . Discitis 04/07/2020  . Dyslipidemia 05/30/2016  . Dyspnea    upon exertion,  as of 06/05/09 not having any recent chest pain  . Dysrhythmia    a fib  . Endocarditis 04/13/2020  . Essential hypertension 05/30/2016  . Heart murmur    Has had an Aortic valve replacement  . History of fusion of cervical spine 03/23/2020  . Hyperactive gag reflex 02/15/2018  . Hypothyroidism   . Intermittent claudication ([place]) 10/31/2018  . Lumbar stenosis with neurogenic claudication 06/11/2019  . Macular degeneration 02/15/2018  . PAF (paroxysmal atrial fibrillation) ([place]) 05/30/2016  . Pre-operative cardiovascular examination 01/03/2020  . Preoperative cardiovascular examination 04/17/2019  . Prosthetic valve endocarditis ([place]) 04/13/2020  . Protein-calorie malnutrition, severe 03/24/2020  . Pseudogout of left knee 03/21/2020  . Renal insufficiency 01/17/2020  . S/P aortic valve replacement 01/17/2020  . S/P AVR 01/17/2020  . Septic arthritis ([place]) 03/23/2020  . Tinnitus of left ear 11/13/2019  . Typical atrial flutter [facility])     Past Surgical History:  Procedure Laterality Date  . ANTERIOR CERVICAL DECOMP/DISCECTOMY FUSION N/A 04/22/2019   Procedure: Anterior Cervical Discectomy and Fusion, Cervical four-five, Cervical five-six;  Surgeon: Earnie Larsson, MD;  Location: [place];  Service: Neurosurgery;  Laterality: N/A;  . ANTERIOR CERVICAL DECOMP/DISCECTOMY FUSION N/A 03/02/2020   Procedure: Anterior Cervical Decompression Fusion  - Cervical three-Cervical four;  Surgeon: Earnie Larsson, MD;  Location: [place];  Service: Neurosurgery;  Laterality: N/A;  . AORTIC VALVE REPLACEMENT    . ASCENDING AORTIC ROOT REPLACEMENT N/A 04/16/2020   Procedure: ASCENDING AORTIC ROOT REPLACEMENT - HOMOGRAFT using [place] 23 MM Cryo Aortic Valve.;  Surgeon: Gaye Pollack, MD;  Location: [facility] OR;  Service: Open Heart Surgery;  Laterality: N/A;  . CARDIAC CATHETERIZATION     several stents  .  CARDIAC SURGERY    . CARDIOVERSION N/A 04/13/2020   Procedure: CARDIOVERSION;  Surgeon: Geralynn Rile, MD;  Location: [place];  Service: Cardiovascular;  Laterality: N/A;  . CARDIOVERSION N/A 04/29/2020   Procedure: CARDIOVERSION;  Surgeon: Skeet Latch, MD;  Location: [facility] ENDOSCOPY;  Service: Cardiovascular;  Laterality: N/A;  . CORONARY ARTERY BYPASS GRAFT  2017   patient unsure how many, had at [facility]  . HAND SURGERY    . IR LUMBAR [place] W/IMG GUIDE  04/07/2020  . IRRIGATION AND DEBRIDEMENT KNEE Bilateral 03/24/2020   Procedure: ARTHROSCOPY IRRIGATION AND DEBRIDEMENT BILATERAL  KNEE;  Surgeon: Marchia Bond, MD;  Location: [place];  Service: Orthopedics;  Laterality: Bilateral;  . KNEE SURGERY    . LEFT HEART CATH AND CORONARY ANGIOGRAPHY N/A 01/21/2020   Procedure: LEFT HEART CATH AND CORONARY ANGIOGRAPHY;  Surgeon: Belva Crome, MD;  Location: [place] CV LAB;  Service: Cardiovascular;  Laterality: N/A;  . LEFT HEART CATH AND CORS/GRAFTS ANGIOGRAPHY N/A 05/16/2018   Procedure: LEFT HEART CATH AND CORS/GRAFTS ANGIOGRAPHY;  Surgeon: Burnell Blanks, MD;  Location: [place] CV LAB;  Service: Cardiovascular;  Laterality: N/A;  . LUMBAR LAMINECTOMY/DECOMPRESSION MICRODISCECTOMY Bilateral 06/11/2019   Procedure: Bilateral Lumbar Four-FiveLaminectomy/Foraminotomy;  Surgeon: Earnie Larsson, MD;  Location: [place];  Service: Neurosurgery;  Laterality: Bilateral;  posterior  . TEE WITHOUT CARDIOVERSION N/A 03/26/2020   Procedure: TRANSESOPHAGEAL ECHOCARDIOGRAM (TEE);  Surgeon: Jerline Pain, MD;  Location: [facility] ENDOSCOPY;  Service: Cardiovascular;  Laterality: N/A;  . TEE WITHOUT CARDIOVERSION N/A 04/13/2020   Procedure: TRANSESOPHAGEAL ECHOCARDIOGRAM (TEE);  Surgeon: Geralynn Rile, MD;  Location: [place];  Service: Cardiovascular;  Laterality: N/A;  . TEE WITHOUT CARDIOVERSION N/A 04/16/2020   Procedure: TRANSESOPHAGEAL ECHOCARDIOGRAM (TEE);   Surgeon: Gaye Pollack, MD;  Location: [place];  Service: Open Heart Surgery;  Laterality: N/A;  . TEE WITHOUT CARDIOVERSION N/A 04/29/2020   Procedure: TRANSESOPHAGEAL ECHOCARDIOGRAM (TEE);  Surgeon: Skeet Latch, MD;  Location: [facility] ENDOSCOPY;  Service: Cardiovascular;  Laterality: N/A;     reports that he has never smoked. He has never used smokeless tobacco. He reports previous alcohol use. He reports that he does not use drugs.  Allergies  Allergen Reactions  . Atorvastatin Other (See Comments)    Urinary retention   . Nebivolol Diarrhea    Family History  Problem Relation Age of Onset  . Prostate cancer Brother   . Heart disease Maternal Grandmother   . Brain cancer Mother     Prior to Admission medications   Medication Sig Start Date End Date Taking? Authorizing Provider  acetaminophen (TYLENOL) 325 MG tablet Take 2 tablets (650 mg total) by mouth every 6 (six) hours as needed for moderate pain or fever. 04/13/20   Angiulli, Lavon Paganini, PA-C  amiodarone (PACERONE) 400 MG tablet Take 1 tablet (400 mg total) by mouth 2 (two) times daily for 7 days. 04/30/20 05/07/20  Elgie Collard, PA-C  ampicillin 2 g in sodium chloride 0.9 % 100 mL Inject 2 g into the vein every 4 (four) hours. 04/27/20   Elgie Collard, PA-C  apixaban (ELIQUIS) 5 MG TABS tablet Take 1 tablet (5 mg total) by mouth 2 (two) times daily. 02/04/20   Revankar, Reita Cliche, MD  aspirin EC 81 MG EC tablet Take 1 tablet (81 mg total) by mouth daily. 05/01/20   Barrett, Erin R, PA-C  cefTRIAXone 2 g in sodium chloride 0.9 % 100 mL Inject 2 g into the vein every 12 (twelve) hours. 04/27/20   Elgie Collard, PA-C  cyclobenzaprine (FLEXERIL) 5 MG tablet Take 1 tablet (5 mg total) by mouth 3 (three) times daily as needed for muscle spasms. 04/13/20   Angiulli, Lavon Paganini, PA-C  docusate sodium (COLACE) 100 MG capsule Take 1 capsule (100 mg total) by mouth 2 (two) times daily. 04/07/20   Alma Friendly, MD  ferrous [id number]  C-folic acid (TRINSICON / FOLTRIN) capsule Take 1 capsule by mouth daily with breakfast. For one month then stop. 04/23/20   Nani Skillern, PA-C  insulin aspart (NOVOLOG) 100 UNIT/ML injection Inject 0-15 Units into the skin 3 (three) times daily with meals. 04/13/20   Angiulli, Lavon Paganini, PA-C  insulin aspart (NOVOLOG) 100 UNIT/ML injection Inject 3 Units into the skin 3 (three) times daily with meals. 04/13/20   Angiulli, Lavon Paganini, PA-C  insulin glargine (LANTUS) 100 UNIT/ML injection Inject 0.1 mLs (10 Units total) into the skin 2 (two) times daily. 04/13/20   Angiulli, Lavon Paganini, PA-C  levothyroxine (SYNTHROID) 25 MCG tablet Take 25 mcg by mouth daily before breakfast.  01/23/20   [provider]  metoprolol tartrate (LOPRESSOR) 25 MG tablet Take 1 tablet (25 mg total) by mouth 2 (two) times daily. 04/30/20   Barrett, Lodema Hong, PA-C  oxyCODONE (OXYCONTIN) 10 mg 12 hr tablet Take 1 tablet (10 mg total) by mouth every 12 (twelve) hours. 04/13/20  Angiulli, Lavon Paganini, PA-C  pregabalin (LYRICA) 100 MG capsule Take 1 capsule (100 mg total) by mouth 3 (three) times daily. 04/13/20   Angiulli, Lavon Paganini, PA-C  senna (SENOKOT) 8.6 MG TABS tablet Take 1 tablet (8.6 mg total) by mouth at bedtime. 04/07/20   Alma Friendly, MD  vitamin C (ASCORBIC ACID) 500 MG tablet Take 500 mg by mouth daily.     [provider]    Physical Exam:  Constitutional: Elderly male currently in NAD, calm, comfortable Vitals:   05/02/20 1131 05/02/20 1237 05/02/20 1323 05/02/20 1436  BP: (!) 89/55 (!) 86/57 (!) 72/53 (!) 90/56  Pulse: 97 91 (!) 103 (!) 116  Resp: 17 18 20    Temp: 97.9 F (36.6 C) (!) 97.4 F (36.3 C) 97.9 F (36.6 C) 97.9 F (36.6 C)  TempSrc:    Oral  SpO2: 100% 95% 100% 100%  Weight:      Height:       Eyes: PERRL, lids and conjunctivae normal ENMT: Mucous membranes are moist. Posterior pharynx clear of any exudate or lesions..  Neck: normal, supple, no masses, no thyromegaly  Respiratory: clear to auscultation bilaterally, no wheezing, no crackles. Normal respiratory effort. No accessory muscle use.  Cardiovascular: Irregular irregular, no murmurs / rubs / gallops. No extremity edema. 2+ pedal pulses. No carotid bruits.  Abdomen: no tenderness, no masses palpated. No hepatosplenomegaly. Bowel sounds positive.  Musculoskeletal: no clubbing / cyanosis. No joint deformity upper and lower extremities. Good ROM, no contractures. Normal muscle tone.  Skin: no rashes, lesions, ulcers. No induration Neurologic: CN 2-12 grossly intact. Sensation intact, DTR normal. Strength 5/5 in all 4.  Psychiatric: Normal judgment and insight. Alert and oriented x 3. Normal mood.     Labs on Admission: I have personally reviewed following labs and imaging studies  CBC: Recent Labs  Lab 04/26/20 0315 04/29/20 0500 05/01/20 0420  WBC 9.4 7.8 6.8  NEUTROABS  --   --  4.8  HGB 8.6* 8.3* 7.4*  HCT 28.0* 27.1* 24.7*  MCV 95.9 95.4 96.5  PLT 321 328 620   Basic Metabolic Panel: Recent Labs  Lab 04/26/20 0315 04/29/20 0500 05/01/20 0420  NA 137 137 135  K 4.0 3.7 4.0  CL 99 102 102  CO2 26 25 27   GLUCOSE 176* 121* 102*  BUN 19 16 15   CREATININE 1.39* 1.28* 1.25*  CALCIUM 8.9 8.7* 8.7*   GFR: Estimated Creatinine Clearance: 59.2 mL/min (A) (by C-G formula based on SCr of 1.25 mg/dL (H)). Liver Function Tests: Recent Labs  Lab 05/01/20 0420  AST 44*  ALT 58*  ALKPHOS 126  BILITOT 0.8  PROT 5.7*  ALBUMIN 2.0*   No results for input(s): LIPASE, AMYLASE in the last 168 hours. No results for input(s): AMMONIA in the last 168 hours. Coagulation Profile: No results for input(s): INR, PROTIME in the last 168 hours. Cardiac Enzymes: No results for input(s): CKTOTAL, CKMB, CKMBINDEX, TROPONINI in the last 168 hours. BNP (last 3 results) No results for input(s): PROBNP in the last 8760 hours. HbA1C: No results for input(s): HGBA1C in the last 72 hours. CBG: Recent  Labs  Lab 05/01/20 1657 05/01/20 2116 05/02/20 0555 05/02/20 0945 05/02/20 1133  GLUCAP 153* 180* 148* 228* 217*   Lipid Profile: No results for input(s): CHOL, HDL, LDLCALC, TRIG, CHOLHDL, LDLDIRECT in the last 72 hours. Thyroid Function Tests: No results for input(s): TSH, T4TOTAL, FREET4, T3FREE, THYROIDAB in the last 72 hours. Anemia Panel: No results  for input(s): VITAMINB12, FOLATE, FERRITIN, TIBC, IRON, RETICCTPCT in the last 72 hours. Urine analysis:    Component Value Date/Time   COLORURINE YELLOW 04/11/2020 [place] 04/11/2020 2245   LABSPEC 1.009 04/11/2020 2245   PHURINE 7.0 04/11/2020 2245   GLUCOSEU NEGATIVE 04/11/2020 2245   HGBUR NEGATIVE 04/11/2020 2245   BILIRUBINUR NEGATIVE 04/11/2020 2245   KETONESUR NEGATIVE 04/11/2020 2245   PROTEINUR NEGATIVE 04/11/2020 2245   NITRITE NEGATIVE 04/11/2020 2245   LEUKOCYTESUR NEGATIVE 04/11/2020 2245   Sepsis Labs: Recent Results (from the past 240 hour(s))  Body fluid culture     Status: None (Preliminary result)   Collection Time: 04/30/20  5:28 PM   Specimen: Synovium; Body Fluid  Result Value Ref Range Status   Specimen Description SYNOVIAL LEFT KNEE  Final   Special Requests NONE  Final   Gram Stain   Final    ABUNDANT WBC PRESENT, PREDOMINANTLY PMN NO ORGANISMS SEEN    Culture   Final    NO GROWTH 2 DAYS Performed at [facility], [address], [place], [place] [address]    Report Status PENDING  Incomplete     Radiological Exams on Admission: MR KNEE RIGHT WO CONTRAST  Result Date: 05/01/2020 CLINICAL DATA:  Bilateral knee pain and swelling. Previous bilateral knee irrigation and debridement on 03/22/2020. Bilateral knee aspiration today. Concern for septic arthritis. EXAM: MRI OF THE RIGHT KNEE WITHOUT CONTRAST TECHNIQUE: Multiplanar, multisequence MR imaging of the knee was performed. No intravenous contrast was administered. COMPARISON:  None. FINDINGS: MENISCI Medial  meniscus:  Intact with normal morphology. Lateral meniscus: Mild degeneration of the anterior horn. No evidence of meniscal tear. LIGAMENTS Cruciates: Severe ACL mucoid degeneration without evidence of acute ligamentous injury. The PCL is intact. Collaterals:  Intact. CARTILAGE Patellofemoral: Severe patellofemoral degenerative changes with diffuse chondral thinning and osteophyte formation. Medial: Mild chondral thinning with peripheral osteophytes and subchondral edema. Lateral: Moderate chondral thinning with surface irregularity, osteophytes and peripheral subchondral edema in the lateral femoral condyle. There is a small peripheral osteochondral lesion which appears chronic. No evidence of erosive disease. MISCELLANEOUS Joint: Moderate to large complex joint effusion with heterogeneous signal, most consistent with synovitis. Popliteal Fossa: There is complex fluid within the popliteus hiatus. No significant Baker's cyst. Extensor Mechanism:  Intact. Bones: No evidence of acute fracture or dislocation. Scattered arthropathic changes as described without bone destruction or generalized periarticular edema to suggest septic arthritis. Other: Subcutaneous edema posterolaterally. No focal fluid collection. IMPRESSION: 1. Moderate to large complex joint effusion with heterogeneous signal, most consistent with synovitis. Associated complex fluid in the popliteus hiatus. 2. No specific evidence of osteomyelitis. 3. Tricompartmental degenerative changes, severe at the patellofemoral compartment. No acute osseous findings. 4. Severe ACL mucoid degeneration without evidence of acute ligamentous injury. Electronically Signed   By: Richardean Sale M.D.   On: 05/01/2020 08:15   MR KNEE LEFT WO CONTRAST  Result Date: 05/01/2020 CLINICAL DATA:  Bilateral knee pain and swelling. Previous bilateral knee irrigation and debridement on 03/22/2020. Bilateral knee aspiration today. Concern for septic arthritis. EXAM: MRI OF THE  LEFT KNEE WITHOUT CONTRAST TECHNIQUE: Multiplanar, multisequence MR imaging of the knee was performed. No intravenous contrast was administered. COMPARISON:  Radiographs 03/21/2020 FINDINGS: MENISCI Medial meniscus: Irregular degenerative tear of the posterior horn, involving the meniscal root. There is resulting peripheral extrusion of the meniscus from the joint space. No centrally displaced meniscal fragment identified. Lateral meniscus: There is a radial tear of the posterior  horn, best seen on the coronal images. No centrally displaced meniscal fragment. The meniscus is partially extruded peripherally from the joint. LIGAMENTS Cruciates: Poorly defined anterior cruciate ligament, likely due to advanced ACL mucoid degeneration. Integrity of the ligament is difficult to confirm. There is also probable mucoid degeneration of the PCL. No acute ligamentous findings. Collaterals: Intact. Moderate fluid in the medial collateral ligament bursa. CARTILAGE Patellofemoral: Advanced patellofemoral arthropathy with diffuse joint space narrowing and osteophytes. Medial: Diffuse chondral thinning, surface irregularity and osteophyte formation. Scattered nonspecific edema and subchondral cyst formation without focal erosions or bone destruction. Lateral: Diffuse chondral thinning, surface irregularity and osteophyte formation. Scattered nonspecific subchondral cyst formation and edema in the lateral femoral condyle. No bone destruction. MISCELLANEOUS Joint: Large complex joint effusion with diffuse synovial irregularity suspicious for synovitis. Popliteal Fossa: There is complex fluid in the popliteus hiatus. There is a small Baker's cyst. Extensor Mechanism:  Intact. Bones: Nonspecific marrow changes as described above, consistent with underlying arthropathy/synovitis. No bone destruction or generalized periarticular edema to suggest septic arthritis or osteomyelitis. Other: Mild soft tissue edema posterolaterally without  other focal fluid collection. IMPRESSION: 1. Large complex joint effusion with diffuse synovial irregularity suspicious for synovitis, nonspecific in etiology. Based on prior radiographs and aspiration results, findings could be secondary to CPPD arthropathy. 2. No specific evidence of septic arthritis or osteomyelitis. 3. Complex degenerative tear of the posterior horn of the medial meniscus, involving the meniscal root. Radial tear of the posterior horn of the lateral meniscus. 4. Advanced ACL mucoid degeneration and probable mucoid degeneration of the PCL. 5. Moderate medial collateral ligament bursitis. Electronically Signed   By: Richardean Sale M.D.   On: 05/01/2020 08:24    EKG: Independently reviewed.  Atrial fibrillation 103 bpm with QTc 529  Assessment/Plan  Acute blood loss anemia Transient hypotension: Patient noted to have hemoglobin slowly trending down to 8.6-> 8.3 ->7.4 yesterday prior to procedure with Dr. Sharol Given today.  Patient found to have  blood pressures as low as 72/53.  Blood pressures noted to temporarily improve improve with IV fluids. -Admit to a progressive bed -Stat H&H ordered -Ordered transfusion of 1 unit of PRBCs -Continue to monitor H&H and transfuse blood products to maintain hemoglobin of at least 8 g/dL given prior cardiac history -Hold blood pressure medications  Paroxysmal atrial fibrillation on chronic anticoagulation: Patient noted to be in atrial fibrillation with heart rates into 110s.  He had been restarted on Eliquis.  Previously evaluated by cardiology who had recommended amiodarone as seen below. -Continue amiodarone 400 mg twice daily until 6/18, and then started on 200 mg daily -Goal potassium 4 and magnesium 2 -Resume Eliquis in a.m. hemoglobin is remained stable  Prosthetic valve endocarditis with aortic root abscess: Patient status post aortic valve replacement. -Follow-up with ID for any further recommendations regarding Rocephin and ampicillin   History of Enterococcus faecalis bacteremia L4-L5 osteomyelitis/discitis Septic arthritis of knee, bilateral -ID previously recommended continue ampicillin through 6/30 -Continue PT/OT  -Transitions of care consulted  Diabetes mellitus type 2: Blood sugars seem to trend up to 300s.  Patient appears to have been on a resistant sliding scale insulin with meals and 8 units of Lantus daily while in rehab. -Hypoglycemic protocols -Lantus 5 units twice daily -CBGs before every meal with resistant SSI  History of CAD/CABG with patent LIMA-LAD -Holding metoprolol due to low blood pressures at this time  Prolonged QT interval: Initial QTc elevated at 529. -Correcting electrolytes -Limiting QT prolonging medications  Hypothyroidism -Continue  levothyroxine    DVT prophylaxis: Restart Eliquis no signs of continued bleed Code Status: Full Family Communication: Family member updated at bedside Disposition Plan: Likely discharge back to the rehab inpatient unit when medically stable Consults called: None Admission status: Inpatient  Norval Morton MD [facility] Pager [phone]   If 7PM-7AM, please contact night-coverage [email or web address] Password [facility]  05/02/2020, 2:52 PM

## 2020-05-02 NOTE — Progress Notes (Signed)
Patient back from PACU . Report received; assessed patient vital signs taken as scheduled.

## 2020-05-02 NOTE — Progress Notes (Signed)
Blood currently infusing. Will retime H&H for 2 hours after blood transfusion completed.

## 2020-05-02 NOTE — Transfer of Care (Signed)
Immediate Anesthesia Transfer of Care Note  Patient: Johnathan Murray  Procedure(s) Performed: SYNOVECTOMY (Bilateral ) IRRIGATION AND DEBRIDEMENT BILATERAL KNEES (Bilateral )  Patient Location: PACU  Anesthesia Type:General  Level of Consciousness: drowsy and patient cooperative  Airway & Oxygen Therapy: Patient Spontanous Breathing and Patient connected to nasal cannula oxygen  Post-op Assessment: Report given to RN, Post -op Vital signs reviewed and stable and Patient moving all extremities  Post vital signs: Reviewed and stable  Last Vitals:  Vitals Value Taken Time  BP    Temp    Pulse 79 05/02/20 0928  Resp 10 05/02/20 0928  SpO2 100 % 05/02/20 0928  Vitals shown include unvalidated device data.  Last Pain:  Vitals:   05/02/20 0600  TempSrc:   PainSc: 0-No pain      Patients Stated Pain Goal: 2 (70/17/79 3903)  Complications: No complications documented.

## 2020-05-02 NOTE — Anesthesia Procedure Notes (Signed)
Procedure Name: LMA Insertion Date/Time: 05/02/2020 8:06 AM Performed by: Moshe Salisbury, CRNA Pre-anesthesia Checklist: Patient identified, Emergency Drugs available, Suction available and Patient being monitored Patient Re-evaluated:Patient Re-evaluated prior to induction Oxygen Delivery Method: Circle System Utilized Preoxygenation: Pre-oxygenation with 100% oxygen Induction Type: IV induction Ventilation: Mask ventilation without difficulty LMA: LMA inserted LMA Size: 5.0 Number of attempts: 1 Placement Confirmation: positive ETCO2 Tube secured with: Tape Dental Injury: Teeth and Oropharynx as per pre-operative assessment

## 2020-05-02 NOTE — Anesthesia Preprocedure Evaluation (Addendum)
Anesthesia Evaluation  Patient identified by MRN, date of birth, ID band Patient awake    Reviewed: Allergy & Precautions, NPO status , Patient's Chart, lab work & pertinent test results, reviewed documented beta blocker date and time   Airway Mallampati: II  TM Distance: >3 FB Neck ROM: Full    Dental  (+) Edentulous Upper, Edentulous Lower   Pulmonary    breath sounds clear to auscultation + decreased breath sounds      Cardiovascular hypertension,  Rhythm:Regular Rate:Normal     Neuro/Psych    GI/Hepatic   Endo/Other  diabetes, Well Controlled, Type 2  Renal/GU      Musculoskeletal   Abdominal   Peds  Hematology   Anesthesia Other Findings   Reproductive/Obstetrics                            Anesthesia Physical Anesthesia Plan  ASA: III  Anesthesia Plan: General   Post-op Pain Management:    Induction: Intravenous  PONV Risk Score and Plan: Ondansetron, Dexamethasone and Midazolam  Airway Management Planned: LMA  Additional Equipment: None  Intra-op Plan:   Post-operative Plan: Extubation in OR  Informed Consent: I have reviewed the patients History and Physical, chart, labs and discussed the procedure including the risks, benefits and alternatives for the proposed anesthesia with the patient or authorized representative who has indicated his/her understanding and acceptance.     Dental advisory given  Plan Discussed with: CRNA and Anesthesiologist  Anesthesia Plan Comments:         Anesthesia Quick Evaluation

## 2020-05-02 NOTE — Progress Notes (Addendum)
CRITICAL VALUE STICKER  CRITICAL VALUE: Hbg 5.6  RECEIVER (on-site recipient of call): Johnathan Murray  DATE & TIME NOTIFIED: 05/02/20 @ 2345  MESSENGER (representative from lab): Johnathan Auerbach  MD NOTIFIEDKennon Murray  TIME OF NOTIFICATION: 05/02/20 @ 5035  RESPONSE: received - admin 2 units PRBC   Pt received 1 unit PRBC. CBC was drawn 2 hours after blood transfusion completed. Pt A&Ox4. 100/70BP, 110P, O2 is 100% Room Air     **At shift change wound vac did not have seal. It was estimated that it was in this state for 1-1.5 hours. Seal and clog fixed and working. 150 mL was pulled off via wound vac and this was documented in my last note. Canister changed and as of 05/02/20 @ 2345 there is 42mL of blood in Wound vac canister.

## 2020-05-03 ENCOUNTER — Inpatient Hospital Stay (HOSPITAL_COMMUNITY): Payer: BC Managed Care – PPO | Admitting: Occupational Therapy

## 2020-05-03 ENCOUNTER — Encounter (HOSPITAL_COMMUNITY): Payer: Self-pay | Admitting: Orthopedic Surgery

## 2020-05-03 ENCOUNTER — Inpatient Hospital Stay (HOSPITAL_COMMUNITY): Payer: BC Managed Care – PPO

## 2020-05-03 DIAGNOSIS — D62 Acute posthemorrhagic anemia: Principal | ICD-10-CM

## 2020-05-03 DIAGNOSIS — I330 Acute and subacute infective endocarditis: Secondary | ICD-10-CM | POA: Diagnosis not present

## 2020-05-03 DIAGNOSIS — M4646 Discitis, unspecified, lumbar region: Secondary | ICD-10-CM | POA: Diagnosis not present

## 2020-05-03 DIAGNOSIS — A4181 Sepsis due to Enterococcus: Secondary | ICD-10-CM | POA: Diagnosis not present

## 2020-05-03 DIAGNOSIS — Z952 Presence of prosthetic heart valve: Secondary | ICD-10-CM

## 2020-05-03 DIAGNOSIS — I4819 Other persistent atrial fibrillation: Secondary | ICD-10-CM | POA: Diagnosis not present

## 2020-05-03 DIAGNOSIS — I959 Hypotension, unspecified: Secondary | ICD-10-CM

## 2020-05-03 DIAGNOSIS — I480 Paroxysmal atrial fibrillation: Secondary | ICD-10-CM

## 2020-05-03 DIAGNOSIS — T826XXD Infection and inflammatory reaction due to cardiac valve prosthesis, subsequent encounter: Secondary | ICD-10-CM | POA: Diagnosis not present

## 2020-05-03 DIAGNOSIS — R7881 Bacteremia: Secondary | ICD-10-CM | POA: Diagnosis not present

## 2020-05-03 DIAGNOSIS — R578 Other shock: Secondary | ICD-10-CM | POA: Diagnosis not present

## 2020-05-03 DIAGNOSIS — M009 Pyogenic arthritis, unspecified: Secondary | ICD-10-CM | POA: Diagnosis not present

## 2020-05-03 DIAGNOSIS — E088 Diabetes mellitus due to underlying condition with unspecified complications: Secondary | ICD-10-CM | POA: Diagnosis not present

## 2020-05-03 LAB — CBC
HCT: 22.6 % — ABNORMAL LOW (ref 39.0–52.0)
HCT: 21.8 % — ABNORMAL LOW (ref 39.0–52.0)
Hemoglobin: 7.5 g/dL — ABNORMAL LOW (ref 13.0–17.0)
Hemoglobin: 7.3 g/dL — ABNORMAL LOW (ref 13.0–17.0)
MCH: 29.6 pg (ref 26.0–34.0)
MCH: 29.6 pg (ref 26.0–34.0)
MCHC: 33.2 g/dL (ref 30.0–36.0)
MCHC: 33.5 g/dL (ref 30.0–36.0)
MCV: 89.3 fL (ref 80.0–100.0)
MCV: 88.3 fL (ref 80.0–100.0)
Platelets: 250 10*3/uL (ref 150–400)
Platelets: 226 10*3/uL (ref 150–400)
RBC: 2.53 MIL/uL — ABNORMAL LOW (ref 4.22–5.81)
RBC: 2.47 MIL/uL — ABNORMAL LOW (ref 4.22–5.81)
RDW: 14.6 % (ref 11.5–15.5)
RDW: 14.6 % (ref 11.5–15.5)
WBC: 9.6 10*3/uL (ref 4.0–10.5)
WBC: 10.2 10*3/uL (ref 4.0–10.5)
nRBC: 0 % (ref 0.0–0.2)
nRBC: 0 % (ref 0.0–0.2)

## 2020-05-03 LAB — BASIC METABOLIC PANEL
Anion gap: 8 (ref 5–15)
BUN: 25 mg/dL — ABNORMAL HIGH (ref 8–23)
CO2: 24 mmol/L (ref 22–32)
Calcium: 8.3 mg/dL — ABNORMAL LOW (ref 8.9–10.3)
Chloride: 102 mmol/L (ref 98–111)
Creatinine, Ser: 1.44 mg/dL — ABNORMAL HIGH (ref 0.61–1.24)
GFR calc Af Amer: 58 mL/min — ABNORMAL LOW (ref >60)
GFR calc non Af Amer: 50 mL/min — ABNORMAL LOW (ref >60)
Glucose, Bld: 269 mg/dL — ABNORMAL HIGH (ref 70–99)
Potassium: 4.6 mmol/L (ref 3.5–5.1)
Sodium: 134 mmol/L — ABNORMAL LOW (ref 135–145)

## 2020-05-03 LAB — MAGNESIUM: Magnesium: 1.9 mg/dL (ref 1.7–2.4)

## 2020-05-03 LAB — GLUCOSE, CAPILLARY
Glucose-Capillary: 272 mg/dL — ABNORMAL HIGH (ref 70–99)
Glucose-Capillary: 196 mg/dL — ABNORMAL HIGH (ref 70–99)

## 2020-05-03 LAB — PREPARE RBC (CROSSMATCH)

## 2020-05-03 MED ORDER — ACETAMINOPHEN 325 MG PO TABS
650.0000 mg | ORAL_TABLET | Freq: Once | ORAL | Status: AC
  Administered 2020-05-03: 650 mg via ORAL
  Filled 2020-05-03: qty 2

## 2020-05-03 MED ORDER — INSULIN GLARGINE 100 UNIT/ML ~~LOC~~ SOLN
10.0000 [IU] | Freq: Two times a day (BID) | SUBCUTANEOUS | Status: DC
  Administered 2020-05-03 – 2020-05-14 (×22): 10 [IU] via SUBCUTANEOUS
  Filled 2020-05-03 (×23): qty 0.1

## 2020-05-03 MED ORDER — METOPROLOL TARTRATE 12.5 MG HALF TABLET
12.5000 mg | ORAL_TABLET | Freq: Two times a day (BID) | ORAL | Status: DC
  Administered 2020-05-03 – 2020-05-13 (×18): 12.5 mg via ORAL
  Filled 2020-05-03 (×21): qty 1

## 2020-05-03 MED ORDER — SODIUM CHLORIDE 0.9% IV SOLUTION: Freq: Once | INTRAVENOUS | Status: AC

## 2020-05-03 MED ORDER — DIPHENHYDRAMINE HCL 50 MG/ML IJ SOLN
25.0000 mg | Freq: Once | INTRAMUSCULAR | Status: AC
  Administered 2020-05-03: 25 mg via INTRAVENOUS
  Filled 2020-05-03: qty 1

## 2020-05-03 MED ORDER — FUROSEMIDE 10 MG/ML IJ SOLN
20.0000 mg | Freq: Once | INTRAMUSCULAR | Status: AC
  Administered 2020-05-03: 20 mg via INTRAVENOUS
  Filled 2020-05-03: qty 2

## 2020-05-03 NOTE — Progress Notes (Addendum)
05/03/20 @ 0001: Pts wife noticed serosanguinous fluid leaking out of bottom of leg wound dressing. Upon assessment there is serosanguinous fluid pooling at bottom of negative pressure dressing on left knee. Foam appears flat/compressed at top but not as well toward bottom of foam. Foam possibly moved when seal was lost around shift change not allowing proper seal thus continues to steadily drain? Canister now reads 100 mL for a total of 285mL serosanguinous fluid since 05/02/20 @ 1900. Paged Dr. Sharol Given and spoke with him about everything that has happened with patients left knee from arrival to unit. He recommended reinforcing dressing with Koban or Ace wrap. We applied more tegaderm and placed pad underneath to monitor for leakage. Pt vitals are stable, pt is A&O and receiving blood. Will continue to monitor.     05/03/20 @ 0400: 1 of 2 units PRBC administered. Preparing for second unit. Assessed left knee dressing and there is scant amount of leakage on pad under knee dressing. Currently 70mL in vac container.

## 2020-05-03 NOTE — Progress Notes (Signed)
OT Cancellation Note  Patient Details Name: Johnathan Murray MRN: [id number] DOB: [birth year]   Cancelled Treatment:    Reason Eval/Treat Not Completed: Fatigue/lethargy limiting ability to participate;Medical issues which prohibited therapy; pt currently receiving blood, HR fluctuating (110s up to 140s while in supine and with bed mobility when working with PT), pt fatigued and requesting hold further therapies until tomorrow. Will follow up for OT eval as able.  Johnathan Murray, OT Acute Rehabilitation Services Pager [phone] Office [phone]   Johnathan Murray 05/03/2020, 3:33 PM

## 2020-05-03 NOTE — IPOC Note (Signed)
No IPOC given short LOS

## 2020-05-03 NOTE — Progress Notes (Addendum)
Cardiology Consultation:   Patient ID: Johnathan Murray MRN: [id number]; DOB: 1951-02-17  Admit date: 05/02/2020 Date of Consult: 05/03/2020  Primary Care Provider: Nicoletta Dress, MD [facility] Cardiologist: Jenean Lindau, MD  [facility] Electrophysiologist:  None    Patient Profile:   Johnathan Murray is a 69 y.o. male with a history of CAD s/p CABG in 2017 with most recent cath in 01/2020 showing 2/3 graft occluded, aortic stenosis s/p bioprosthetic AVR at time of CABG with recently diagnosed endocarditis of aortic valve on 04/13/2020 now s/p debridement of aortic root abscess and aortic root replacement on 04/16/2020, paroxysmal atrial fibrillation on Eliquis, recurrent bilateral knee effusions, hypertension, dyslipidemia, diabetes on insulin, hypothyroidism, lumbar stenosis with neurogenic claudication, and cervical decompression in 01/2020 who is being seen today for the evaluation of atrial fibrillation at the request of Dr. Sloan Leiter.  History of Present Illness:   [name] is a 69 year old male with the above history who is followed by Dr. Geraldo Pitter. Patient underwent stress test in 12/2019 as part of pre-op evaluation for upcoming neck surgery. Stress test came back abnormal with a defect in basal anterolateral and mid anterolateral location. Therefore, she was referred for cardiac catheterization. He underwent left heart catheterization on 01/21/2020 which showed patent LIMA to LAD but occluded SVG to OM and SVG to Diag. Patient felt to be at acceptable risk for surgery. Patient underwent C3-C4 anterior cervical decompression and fusion for treatment of his compressive cervical myelopathy on 03/02/2020. He tolerated the procedure well with no immediate complication and was discharged the next day. However, she then developed progressive weakness, increasing back pain, and poor appetite and presented back to the ED on 03/19/2020 where she was found to be in AKI with creatinine of  6.40 on presentation (baseline 1.3 to 1.6). She was admitted for sepsis likely secondary to Enterococcus faecalis bacteremia but was also found to have acute osteomyelitis discitis of L4-L5 and likely left lower extremity septic knee. TEE during admission showed no obvious vegetation but increased transaortic gradient across the bioprosthetic valve. Hhe was treated with IV antibiotics. Neurosurgery was consulted for acute osteomyelitis discitis and recommended disc aspiration by IR which was performed on 04/07/2020. He was also noted to transaminitis on admission which slowly improved throughout admission. This was felt to be secondary to sepsis/hypotension/shock liver. Hepatitis panel non-reactive. Amiodarone was discontinued. Patient was discharged on 04/07/2020 to CIR. We were consulted the next day on 04/08/2020 for atrial flutter with RVR. He was restarted on Amiodarone and then underwent successful TEE/DCCV on 04/13/2020. 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 Dr. Cyndia Bent performed debridement of aortic root abscess/pseudoaneurysm and aortic root replacement with a 66mm homograft aortic valve/root reimplantation of coronary button to homograft on 04/16/2020. He was noted to be back in atrial fibrillation on 04/27/2020 and underwent repeat TEE/DCCV on 04/29/2020 which was successful. Cardiology signed off on 04/30/2020 and patient was discharged back to CIR. Patient was then taken back to the OR on 05/02/2020 for total synovectomy of both knees given recurrent bilateral knee effusions. Following procedure, patient hypotensive with systolic BP's in the 45'Y and was back in atrial fibrillation. Patient hemoglobin noted to be 5.6 so he was transfused 3 units of PRBCs. Patient was readmitted as inpatient and Cardiology again consulted.   At the time of this evaluation, patient resting comfortably in no acute distress. He has both knees wrapped with  drainage tubes  in placed with sanguineous drainage (more from left knee). Currently in atrial fibrillation with rates ranging from 100's to 140's. He notes intermittent palpitations but denies any other cardiac symptoms. No chest pain, shortness of breath, lightheadedness, dizziness, syncope, orthopnea, or PND. He does have some mild lower extremity edema (left > right). He notes some pain his left knee but that is it. No other complaints at this time.   Past Medical History:  Diagnosis Date  . Abnormal LFTs (liver function tests) 10/31/2016  . Abnormal nuclear stress test 05/03/2018  . Acid reflux 02/15/2018   not at present  . Acute otitis externa of left ear 11/13/2019  . Acute suppurative otitis media of left ear without spontaneous rupture of tympanic membrane 11/13/2019  . Anemia    low iron  . Aortic stenosis 05/03/2018  . Aortic valve abscess 04/13/2020  . ARF (acute renal failure) ([place]) 03/20/2020  . Arthritis 02/15/2018   Overview:  shoulder  . Bacteremia due to Enterococcus 03/21/2020  . Cervical myelopathy ([place]) 04/22/2019  . Conductive hearing loss of left ear 11/13/2019  . Coronary artery disease involving native coronary artery of native heart without angina pectoris 05/30/2016  . Coronary artery disease of native artery of native heart with stable angina pectoris ([place])    pt. denies  . Diabetes mellitus due to underlying condition with unspecified complications ([place]) [id number]/7341  . Discitis 04/07/2020  . Dyslipidemia 05/30/2016  . Dyspnea    upon exertion,  as of 06/05/09 not having any recent chest pain  . Dysrhythmia    a fib  . Endocarditis 04/13/2020  . Essential hypertension 05/30/2016  . Heart murmur    Has had an Aortic valve replacement  . History of fusion of cervical spine 03/23/2020  . Hyperactive gag reflex 02/15/2018  . Hypothyroidism   . Intermittent claudication ([place]) 10/31/2018  . Lumbar stenosis with neurogenic claudication 06/11/2019  . Macular degeneration  02/15/2018  . PAF (paroxysmal atrial fibrillation) ([place]) 05/30/2016  . Pre-operative cardiovascular examination 01/03/2020  . Preoperative cardiovascular examination 04/17/2019  . Prosthetic valve endocarditis ([place]) 04/13/2020  . Protein-calorie malnutrition, severe 03/24/2020  . Pseudogout of left knee 03/21/2020  . Renal insufficiency 01/17/2020  . S/P aortic valve replacement 01/17/2020  . S/P AVR 01/17/2020  . Septic arthritis ([place]) 03/23/2020  . Tinnitus of left ear 11/13/2019  . Typical atrial flutter [facility])     Past Surgical History:  Procedure Laterality Date  . ANTERIOR CERVICAL DECOMP/DISCECTOMY FUSION N/A 04/22/2019   Procedure: Anterior Cervical Discectomy and Fusion, Cervical four-five, Cervical five-six;  Surgeon: Earnie Larsson, MD;  Location: [place];  Service: Neurosurgery;  Laterality: N/A;  . ANTERIOR CERVICAL DECOMP/DISCECTOMY FUSION N/A 03/02/2020   Procedure: Anterior Cervical Decompression Fusion  - Cervical three-Cervical four;  Surgeon: Earnie Larsson, MD;  Location: [place];  Service: Neurosurgery;  Laterality: N/A;  . AORTIC VALVE REPLACEMENT    . ASCENDING AORTIC ROOT REPLACEMENT N/A 04/16/2020   Procedure: ASCENDING AORTIC ROOT REPLACEMENT - HOMOGRAFT using Corinth 23 MM Cryo Aortic Valve.;  Surgeon: Gaye Pollack, MD;  Location: [facility];  Service: Open Heart Surgery;  Laterality: N/A;  . CARDIAC CATHETERIZATION     several stents  . CARDIAC SURGERY    . CARDIOVERSION N/A 04/13/2020   Procedure: CARDIOVERSION;  Surgeon: Geralynn Rile, MD;  Location: [place];  Service: Cardiovascular;  Laterality: N/A;  . CARDIOVERSION N/A 04/29/2020   Procedure: CARDIOVERSION;  Surgeon: Skeet Latch, MD;  Location: [place];  Service: Cardiovascular;  Laterality: N/A;  . CORONARY ARTERY BYPASS GRAFT  2017   patient unsure how many, had at [facility]  . HAND SURGERY    . I & D EXTREMITY Bilateral 05/02/2020   Procedure: IRRIGATION AND DEBRIDEMENT BILATERAL  KNEES;  Surgeon: Newt Minion, MD;  Location: [place];  Service: Orthopedics;  Laterality: Bilateral;  . IR LUMBAR DISC ASPIRATION W/IMG GUIDE  04/07/2020  . IRRIGATION AND DEBRIDEMENT KNEE Bilateral 03/24/2020   Procedure: ARTHROSCOPY IRRIGATION AND DEBRIDEMENT BILATERAL  KNEE;  Surgeon: Marchia Bond, MD;  Location: [place];  Service: Orthopedics;  Laterality: Bilateral;  . KNEE SURGERY    . LEFT HEART CATH AND CORONARY ANGIOGRAPHY N/A 01/21/2020   Procedure: LEFT HEART CATH AND CORONARY ANGIOGRAPHY;  Surgeon: Belva Crome, MD;  Location: [place] CV LAB;  Service: Cardiovascular;  Laterality: N/A;  . LEFT HEART CATH AND CORS/GRAFTS ANGIOGRAPHY N/A 05/16/2018   Procedure: LEFT HEART CATH AND CORS/GRAFTS ANGIOGRAPHY;  Surgeon: Burnell Blanks, MD;  Location: [place] CV LAB;  Service: Cardiovascular;  Laterality: N/A;  . LUMBAR LAMINECTOMY/DECOMPRESSION MICRODISCECTOMY Bilateral 06/11/2019   Procedure: Bilateral Lumbar Four-FiveLaminectomy/Foraminotomy;  Surgeon: Earnie Larsson, MD;  Location: [place];  Service: Neurosurgery;  Laterality: Bilateral;  posterior  . SYNOVECTOMY Bilateral 05/02/2020   Procedure: SYNOVECTOMY;  Surgeon: Newt Minion, MD;  Location: [place];  Service: Orthopedics;  Laterality: Bilateral;  . TEE WITHOUT CARDIOVERSION N/A 03/26/2020   Procedure: TRANSESOPHAGEAL ECHOCARDIOGRAM (TEE);  Surgeon: Jerline Pain, MD;  Location: [facility] ENDOSCOPY;  Service: Cardiovascular;  Laterality: N/A;  . TEE WITHOUT CARDIOVERSION N/A 04/13/2020   Procedure: TRANSESOPHAGEAL ECHOCARDIOGRAM (TEE);  Surgeon: Geralynn Rile, MD;  Location: [place];  Service: Cardiovascular;  Laterality: N/A;  . TEE WITHOUT CARDIOVERSION N/A 04/16/2020   Procedure: TRANSESOPHAGEAL ECHOCARDIOGRAM (TEE);  Surgeon: Gaye Pollack, MD;  Location: [place];  Service: Open Heart Surgery;  Laterality: N/A;  . TEE WITHOUT CARDIOVERSION N/A 04/29/2020   Procedure: TRANSESOPHAGEAL ECHOCARDIOGRAM (TEE);  Surgeon:  Skeet Latch, MD;  Location: [place];  Service: Cardiovascular;  Laterality: N/A;     Home Medications:  Prior to Admission medications   Medication Sig Start Date End Date Taking? Authorizing Provider  amiodarone (PACERONE) 400 MG tablet Take 1 tablet (400 mg total) by mouth 2 (two) times daily for 7 days. Patient taking differently: Take 200 mg by mouth 2 (two) times daily.  04/30/20 05/07/20 Yes Conte, Tessa N, PA-C  amLODipine (NORVASC) 10 MG tablet Take 10 mg by mouth daily. 04/27/20  Yes [provider]  apixaban (ELIQUIS) 5 MG TABS tablet Take 1 tablet (5 mg total) by mouth 2 (two) times daily. 02/04/20  Yes Revankar, Reita Cliche, MD  benazepril (LOTENSIN) 40 MG tablet Take 40 mg by mouth at bedtime.   Yes [provider]  Coenzyme Q10 (CO Q10) 200 MG CAPS Take 200 mg by mouth daily.   Yes [provider]  ezetimibe (ZETIA) 10 MG tablet Take 10 mg by mouth at bedtime.   Yes [provider]  ferrous sulfate 325 (65 FE) MG tablet Take 325 mg by mouth daily with breakfast.   Yes [provider]  furosemide (LASIX) 40 MG tablet Take 40 mg by mouth at bedtime.   Yes [provider]  glimepiride (AMARYL) 2 MG tablet Take 2 mg by mouth daily with breakfast.   Yes [provider]  levothyroxine (SYNTHROID) 25 MCG tablet Take 25 mcg by mouth daily before breakfast.  01/23/20  Yes [provider]  vitamin B-12 (CYANOCOBALAMIN) 1000 MCG tablet Take 1,000 mcg by mouth daily.   Yes [provider]  vitamin C (ASCORBIC ACID) 500 MG tablet Take 500 mg by mouth daily.    Yes [provider]  zinc gluconate 50 MG tablet Take 50 mg by mouth daily.   Yes [provider]  acetaminophen (TYLENOL) 325 MG tablet Take 2 tablets (650 mg total) by mouth every 6 (six) hours as needed for moderate pain or fever. Patient not taking: Reported on 05/03/2020 04/13/20   Angiulli, Lavon Paganini, PA-C  ampicillin 2 g in sodium  chloride 0.9 % 100 mL Inject 2 g into the vein every 4 (four) hours. Patient not taking: Reported on 05/03/2020 04/27/20   Elgie Collard, PA-C  aspirin EC 81 MG EC tablet Take 1 tablet (81 mg total) by mouth daily. Patient not taking: Reported on 05/03/2020 05/01/20   Barrett, Erin R, PA-C  cefTRIAXone 2 g in sodium chloride 0.9 % 100 mL Inject 2 g into the vein every 12 (twelve) hours. Patient not taking: Reported on 05/03/2020 04/27/20   Elgie Collard, PA-C  cyclobenzaprine (FLEXERIL) 5 MG tablet Take 1 tablet (5 mg total) by mouth 3 (three) times daily as needed for muscle spasms. Patient not taking: Reported on 05/03/2020 04/13/20   Angiulli, Lavon Paganini, PA-C  docusate sodium (COLACE) 100 MG capsule Take 1 capsule (100 mg total) by mouth 2 (two) times daily. Patient not taking: Reported on 05/03/2020 04/07/20   Alma Friendly, MD  ferrous [id number] C-folic acid (TRINSICON / FOLTRIN) capsule Take 1 capsule by mouth daily with breakfast. For one month then stop. Patient not taking: Reported on 05/03/2020 04/23/20   Nani Skillern, PA-C  insulin aspart (NOVOLOG) 100 UNIT/ML injection Inject 0-15 Units into the skin 3 (three) times daily with meals. Patient not taking: Reported on 05/03/2020 04/13/20   Angiulli, Lavon Paganini, PA-C  insulin aspart (NOVOLOG) 100 UNIT/ML injection Inject 3 Units into the skin 3 (three) times daily with meals. Patient not taking: Reported on 05/03/2020 04/13/20   Angiulli, Lavon Paganini, PA-C  insulin glargine (LANTUS) 100 UNIT/ML injection Inject 0.1 mLs (10 Units total) into the skin 2 (two) times daily. Patient not taking: Reported on 05/03/2020 04/13/20   Angiulli, Lavon Paganini, PA-C  metoprolol tartrate (LOPRESSOR) 25 MG tablet Take 1 tablet (25 mg total) by mouth 2 (two) times daily. Patient not taking: Reported on 05/03/2020 04/30/20   Barrett, Lodema Hong, PA-C  oxyCODONE (OXYCONTIN) 10 mg 12 hr tablet Take 1 tablet (10 mg total) by mouth every 12 (twelve) hours. Patient not  taking: Reported on 05/03/2020 04/13/20   Angiulli, Lavon Paganini, PA-C  pregabalin (LYRICA) 100 MG capsule Take 1 capsule (100 mg total) by mouth 3 (three) times daily. Patient not taking: Reported on 05/03/2020 04/13/20   Angiulli, Lavon Paganini, PA-C  senna (SENOKOT) 8.6 MG TABS tablet Take 1 tablet (8.6 mg total) by mouth at bedtime. Patient not taking: Reported on 05/03/2020 04/07/20   Alma Friendly, MD    Inpatient Medications: Scheduled Meds: . sodium chloride   Intravenous Once  . amiodarone  400 mg Oral BID  . Chlorhexidine Gluconate Cloth  6 each Topical Daily  . docusate sodium  100 mg Oral BID  . feeding supplement (ENSURE ENLIVE)  237 mL Oral TID BM  . ferrous [id number] C-folic acid  1 capsule Oral Q breakfast  . insulin aspart  0-20 Units Subcutaneous TID WC  .  insulin aspart  0-5 Units Subcutaneous QHS  . insulin glargine  10 Units Subcutaneous BID  . levothyroxine  25 mcg Oral QAC breakfast  . metoprolol tartrate  12.5 mg Oral BID  . oxyCODONE  10 mg Oral Q12H  . pregabalin  100 mg Oral TID  . senna  1 tablet Oral QHS  . sodium chloride flush  10-40 mL Intracatheter Q12H  . sodium chloride flush  3 mL Intravenous Q12H   Continuous Infusions: . ampicillin (OMNIPEN) IV 2 g (05/03/20 1212)  . cefTRIAXone (ROCEPHIN) IVPB 2 gram/100 mL NS (Mini-Bag Plus) 2 g (05/03/20 0653)   PRN Meds: acetaminophen **OR** acetaminophen, albuterol, cyclobenzaprine, oxyCODONE, sodium chloride flush  Allergies:    Allergies  Allergen Reactions  . Atorvastatin Other (See Comments)    Urinary retention   . Nebivolol Diarrhea    Social History:   Social History   Socioeconomic History  . Marital status: Married    Spouse name: [name]  . Number of children: 1  . Years of education: Not on file  . Highest education level: GED or equivalent  Occupational History    Employer: [employer]  Tobacco Use  . Smoking status: Never Smoker  . Smokeless tobacco: Never Used    Vaping Use  . Vaping Use: Never used  Substance and Sexual Activity  . Alcohol use: Not Currently  . Drug use: Never  . Sexual activity: Not Currently  Other Topics Concern  . Not on file  Social History Narrative   He drives a truck for [employer].   He lives with wife in a one-level home.   Highest level of education:  GED      Patient is right-handed. There are 8 steps to enter his home.   Social Determinants of Health   Financial Resource Strain:   . Difficulty of Paying Living Expenses:   Food Insecurity:   . Worried About Charity fundraiser in the Last Year:   . Arboriculturist in the Last Year:   Transportation Needs:   . Film/video editor (Medical):   Marland Kitchen Lack of Transportation (Non-Medical):   Physical Activity:   . Days of Exercise per Week:   . Minutes of Exercise per Session:   Stress:   . Feeling of Stress :   Social Connections:   . Frequency of Communication with Friends and Family:   . Frequency of Social Gatherings with Friends and Family:   . Attends Religious Services:   . Active Member of Clubs or Organizations:   . Attends Archivist Meetings:   Marland Kitchen Marital Status:   Intimate Partner Violence:   . Fear of Current or Ex-Partner:   . Emotionally Abused:   Marland Kitchen Physically Abused:   . Sexually Abused:     Family History:    Family History  Problem Relation Age of Onset  . Prostate cancer Brother   . Heart disease Maternal Grandmother   . Brain cancer Mother      ROS:  Please see the history of present illness.  All other ROS reviewed and negative.     Physical Exam/Data:   Vitals:   05/03/20 0428 05/03/20 0600 05/03/20 0645 05/03/20 1244  BP: (!) 133/94 109/75 132/81 109/83  Pulse: (!) 116 (!) 102 (!) 119 (!) 104  Resp: 18 12 16    Temp: 98.4 F (36.9 C) 98.3 F (36.8 C) 98.3 F (36.8 C)   TempSrc: Oral Oral Oral   SpO2: 100%  97% 98%   Weight:        Intake/Output Summary (Last 24 hours) at 05/03/2020 1401 Last  data filed at 05/03/2020 0700 Gross per 24 hour  Intake 710 ml  Output 1250 ml  Net -540 ml   Last 3 Weights 05/03/2020 05/02/2020 04/30/2020  Weight (lbs) 195 lb 8.8 oz 173 lb 15.1 oz 178 lb 12.7 oz  Weight (kg) 88.7 kg 78.9 kg 81.1 kg     Body mass index is 28.06 kg/m.  General: 69 y.o. male resting comfortably in no acute distress. HEENT: Normocephalic and atraumatic. Sclera clear.  Neck: Supple. No carotid bruits. No JVD. Heart: Tachycardic with irregular rhythm. Distinct S1 and S2. No murmurs, gallops, or rubs. Radial pulses 2+ and equal bilaterally. Lungs: No increased work of breathing. Clear to ausculation bilaterally. No wheezes, rhonchi, or rales.  Abdomen: Soft, non-distended, and non-tender to palpation.  MSK: Normal strength and tone for age. Extremities: Drainage tubes/wound back in place for bilateral pleural effusion. Sanguineous drainage coming from left knee. Mild bilateral lower extremities edema (left > right). Skin: Warm and dry. Neuro: Alert and oriented x3. No focal deficits. Psych: Normal affect. Responds appropriately.   EKG:  The EKG was personally reviewed and demonstrates:  Atrial fibrillation, rate 128 bpm, with incomplete LBBB.   Telemetry:  Telemetry was personally reviewed and demonstrates:  Atrial fibrillation with rates ranging from 100's to 140's.  Relevant CV Studies:  TEE 04/29/2020: 1. Left ventricular ejection fraction, by estimation, is 55 to 60%. The  left ventricle has normal function. The left ventricle has no regional  wall motion abnormalities.  2. Right ventricular systolic function is mildly reduced. The right  ventricular size is normal.  3. No left atrial/left atrial appendage thrombus was detected. The LAA  emptying velocity was 50 cm/s.  4. Multiple regurgitant jets each with mild regurgitation. The mitral  valve is normal in structure. Mild to moderate mitral valve regurgitation.  No evidence of mitral stenosis.  5.  Tricuspid valve regurgitation is mild to moderate.  6. The aortic valve has been repaired/replaced. Aortic valve  regurgitation is trivial. No aortic stenosis is present. There is a 23 mm  Homograft valve/aortic root valve present in the aortic position.  Procedure Date: 04/16/2020. Echo findings are  consistent with normal structure and function of the aortic valve  prosthesis.  7. The inferior vena cava is normal in size with greater than 50%  respiratory variability, suggesting right atrial pressure of 3 mmHg.   Laboratory Data:  High Sensitivity Troponin:  No results for input(s): TROPONINIHS in the last 720 hours.   Chemistry Recent Labs  Lab 04/29/20 0500 05/01/20 0420 05/03/20 0845  NA 137 135 134*  K 3.7 4.0 4.6  CL 102 102 102  CO2 25 27 24   GLUCOSE 121* 102* 269*  BUN 16 15 25*  CREATININE 1.28* 1.25* 1.44*  CALCIUM 8.7* 8.7* 8.3*  GFRNONAA 58* 59* 50*  GFRAA >60 >60 58*  ANIONGAP 10 6 8     Recent Labs  Lab 05/01/20 0420  PROT 5.7*  ALBUMIN 2.0*  AST 44*  ALT 58*  ALKPHOS 126  BILITOT 0.8   Hematology Recent Labs  Lab 04/29/20 0500 04/29/20 0500 05/01/20 0420 05/02/20 2252 05/03/20 0845  WBC 7.8  --  6.8  --  9.6  RBC 2.84*  --  2.56*  --  2.53*  HGB 8.3*   < > 7.4* 5.6* 7.5*  HCT 27.1*   < > 24.7* 17.7*  22.6*  MCV 95.4  --  96.5  --  89.3  MCH 29.2  --  28.9  --  29.6  MCHC 30.6  --  30.0  --  33.2  RDW 14.9  --  15.0  --  14.6  PLT 328  --  283  --  250   < > = values in this interval not displayed.   BNPNo results for input(s): BNP, PROBNP in the last 168 hours.  DDimer No results for input(s): DDIMER in the last 168 hours.   Radiology/Studies:  MR KNEE RIGHT WO CONTRAST  Result Date: 05/01/2020 CLINICAL DATA:  Bilateral knee pain and swelling. Previous bilateral knee irrigation and debridement on 03/22/2020. Bilateral knee aspiration today. Concern for septic arthritis. EXAM: MRI OF THE RIGHT KNEE WITHOUT CONTRAST TECHNIQUE:  Multiplanar, multisequence MR imaging of the knee was performed. No intravenous contrast was administered. COMPARISON:  None. FINDINGS: MENISCI Medial meniscus:  Intact with normal morphology. Lateral meniscus: Mild degeneration of the anterior horn. No evidence of meniscal tear. LIGAMENTS Cruciates: Severe ACL mucoid degeneration without evidence of acute ligamentous injury. The PCL is intact. Collaterals:  Intact. CARTILAGE Patellofemoral: Severe patellofemoral degenerative changes with diffuse chondral thinning and osteophyte formation. Medial: Mild chondral thinning with peripheral osteophytes and subchondral edema. Lateral: Moderate chondral thinning with surface irregularity, osteophytes and peripheral subchondral edema in the lateral femoral condyle. There is a small peripheral osteochondral lesion which appears chronic. No evidence of erosive disease. MISCELLANEOUS Joint: Moderate to large complex joint effusion with heterogeneous signal, most consistent with synovitis. Popliteal Fossa: There is complex fluid within the popliteus hiatus. No significant Baker's cyst. Extensor Mechanism:  Intact. Bones: No evidence of acute fracture or dislocation. Scattered arthropathic changes as described without bone destruction or generalized periarticular edema to suggest septic arthritis. Other: Subcutaneous edema posterolaterally. No focal fluid collection. IMPRESSION: 1. Moderate to large complex joint effusion with heterogeneous signal, most consistent with synovitis. Associated complex fluid in the popliteus hiatus. 2. No specific evidence of osteomyelitis. 3. Tricompartmental degenerative changes, severe at the patellofemoral compartment. No acute osseous findings. 4. Severe ACL mucoid degeneration without evidence of acute ligamentous injury. Electronically Signed   By: Richardean Sale M.D.   On: 05/01/2020 08:15   MR KNEE LEFT WO CONTRAST  Result Date: 05/01/2020 CLINICAL DATA:  Bilateral knee pain and  swelling. Previous bilateral knee irrigation and debridement on 03/22/2020. Bilateral knee aspiration today. Concern for septic arthritis. EXAM: MRI OF THE LEFT KNEE WITHOUT CONTRAST TECHNIQUE: Multiplanar, multisequence MR imaging of the knee was performed. No intravenous contrast was administered. COMPARISON:  Radiographs 03/21/2020 FINDINGS: MENISCI Medial meniscus: Irregular degenerative tear of the posterior horn, involving the meniscal root. There is resulting peripheral extrusion of the meniscus from the joint space. No centrally displaced meniscal fragment identified. Lateral meniscus: There is a radial tear of the posterior horn, best seen on the coronal images. No centrally displaced meniscal fragment. The meniscus is partially extruded peripherally from the joint. LIGAMENTS Cruciates: Poorly defined anterior cruciate ligament, likely due to advanced ACL mucoid degeneration. Integrity of the ligament is difficult to confirm. There is also probable mucoid degeneration of the PCL. No acute ligamentous findings. Collaterals: Intact. Moderate fluid in the medial collateral ligament bursa. CARTILAGE Patellofemoral: Advanced patellofemoral arthropathy with diffuse joint space narrowing and osteophytes. Medial: Diffuse chondral thinning, surface irregularity and osteophyte formation. Scattered nonspecific edema and subchondral cyst formation without focal erosions or bone destruction. Lateral: Diffuse chondral thinning, surface irregularity and osteophyte formation. Scattered nonspecific  subchondral cyst formation and edema in the lateral femoral condyle. No bone destruction. MISCELLANEOUS Joint: Large complex joint effusion with diffuse synovial irregularity suspicious for synovitis. Popliteal Fossa: There is complex fluid in the popliteus hiatus. There is a small Baker's cyst. Extensor Mechanism:  Intact. Bones: Nonspecific marrow changes as described above, consistent with underlying arthropathy/synovitis. No  bone destruction or generalized periarticular edema to suggest septic arthritis or osteomyelitis. Other: Mild soft tissue edema posterolaterally without other focal fluid collection. IMPRESSION: 1. Large complex joint effusion with diffuse synovial irregularity suspicious for synovitis, nonspecific in etiology. Based on prior radiographs and aspiration results, findings could be secondary to CPPD arthropathy. 2. No specific evidence of septic arthritis or osteomyelitis. 3. Complex degenerative tear of the posterior horn of the medial meniscus, involving the meniscal root. Radial tear of the posterior horn of the lateral meniscus. 4. Advanced ACL mucoid degeneration and probable mucoid degeneration of the PCL. 5. Moderate medial collateral ligament bursitis. Electronically Signed   By: Richardean Sale M.D.   On: 05/01/2020 08:24    Assessment and Plan:   Paroxysmal Atrial Fibrillation - Patient has had long complicated hospitalization with recurrent atrial fibrillation in setting of bacteremia and post-operatively. S/p DCCV x2. Now back in atrial fibrillation with RVR in setting of acute blood loss anemia with hemoglobin as low as 5.6.  - Rates in the 100's to 140's. Relatively asymptomatic.  - LVEF of 55-60% on TEE on 04/29/2020. - Continue Amiodarone 400mg  twice daily. - Suspect tachycardia is a physiologic response at this point and suspect rates will improve as hemoglobin improves. Do not think there is much to add at this point but will discuss with Dr. Rayann Heman. - Eliquis on hold due to anemia. Restart when OK with Dr. Sharol Given.   CAD s/p CABG - Recent cath in 01/2020 for preo-op evaluation showed 2/3 occluded grafts with patient LIMA to LAD. Continued medical therapy recommended.  - Stable. No chest pain. - Not on Aspirin due to need for anticoagulation. - On Zetia at home for hyperlipidemia but currently on hold due to transaminitis.   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  - Continue antibiotics per ID.   Hypertension - BP soft at times but stable.  - Not on any antihypertensive at this time. - Continue to monitor.   Acute Anemia - Hemoglobin dropped to 5.6 yesterday. S/p 3 units of PRBCs. Hemoglobin 7.5.  - Sanguineous drainage from left knee in wound vac canister.  - Suspect patient needs another transfusions. Defer to primary team.   Otherwise, per primary team.   For questions or updates, please contact [facility] Please consult [email or web address] for contact info under    Signed, Darreld Mclean, PA-C  05/03/2020 2:01 PM   I have seen, examined the patient, and reviewed the above assessment and plan.  Changes to above are made where necessary.  On exam, iRRR. The patient has had an unfortunately recent health history.  afib is likely due to underlying medical issues.  In the setting of his anemia and bleeding, we cannot anticoagulate.   Continue amiodarone for rate control at this time.    Cardiology to follow  Co Sign: Thompson Grayer, MD 05/03/2020 4:01 PM

## 2020-05-03 NOTE — Progress Notes (Signed)
   05/03/20 1829  Assess: MEWS Score  Temp 97.9 F (36.6 C)  BP 110/83  Pulse Rate (!) 108  ECG Heart Rate (!) 110  Resp 19  Level of Consciousness Alert  SpO2 100 %  O2 Device Room Air  Assess: MEWS Score  MEWS Temp 0  MEWS Systolic 0  MEWS Pulse 1  MEWS RR 0  MEWS LOC 0  MEWS Score 1  MEWS Score Color Green  Document  Patient Outcome Stabilized after interventions

## 2020-05-03 NOTE — Consult Note (Signed)
[facility]       Reason for Consult: septic arthritis    Referring Physician: Dr. Sloan Leiter  Active Problems:   Diabetes mellitus due to underlying condition with unspecified complications (HCC)   Dyslipidemia   PAF (paroxysmal atrial fibrillation) (HCC)   S/P AVR   Acute blood loss anemia   Prolonged QT interval   Transient hypotension   . sodium chloride   Intravenous Once  . amiodarone  400 mg Oral BID  . Chlorhexidine Gluconate Cloth  6 each Topical Daily  . docusate sodium  100 mg Oral BID  . feeding supplement (ENSURE ENLIVE)  237 mL Oral TID BM  . ferrous [id number] C-folic acid  1 capsule Oral Q breakfast  . insulin aspart  0-20 Units Subcutaneous TID WC  . insulin aspart  0-5 Units Subcutaneous QHS  . insulin glargine  10 Units Subcutaneous BID  . levothyroxine  25 mcg Oral QAC breakfast  . metoprolol tartrate  12.5 mg Oral BID  . oxyCODONE  10 mg Oral Q12H  . pregabalin  100 mg Oral TID  . senna  1 tablet Oral QHS  . sodium chloride flush  10-40 mL Intracatheter Q12H  . sodium chloride flush  3 mL Intravenous Q12H    Recommendations: Continue ampicillin + ceftriaxone   Assessment: He has multiple issues related to disseminated Enterococcal infection including bilateral septic arthritis initially debrided on 5/4, PV endocarditis with aortic root abscess s/p redo on 5/27 and now again transferred back from rehab with bilateral septic arthritis s/p debridement.   Dr. Megan Salon to follow up tomorrow  Antibiotics: Ampicillin and ceftriaxone day 27  HPI: Johnathan Murray is a 69 y.o. male with afib, bioprosthetic valve replacement in 2017 with recent redo due to the infection as above by Dr. Cyndia Bent, CAD and DM who was transferred back from rehab due to anemia, septic arthritis, recurrent.  Culture in the disc aspiration and knee aspirate with Enterococcus.  He was taken to the OR on 6/12 by Dr. Sharol Given for bilateral knee  washout.  MRI done most concerning for synovitis with no osteomyelitis but over 100,000 WBCs most c/w infection.     Review of Systems:  Constitutional: negative for fevers and chills Gastrointestinal: negative for diarrhea All other systems reviewed and are negative    Past Medical History:  Diagnosis Date  . Abnormal LFTs (liver function tests) 10/31/2016  . Abnormal nuclear stress test 05/03/2018  . Acid reflux 02/15/2018   not at present  . Acute otitis externa of left ear 11/13/2019  . Acute suppurative otitis media of left ear without spontaneous rupture of tympanic membrane 11/13/2019  . Anemia    low iron  . Aortic stenosis 05/03/2018  . Aortic valve abscess 04/13/2020  . ARF (acute renal failure) ([place]) 03/20/2020  . Arthritis 02/15/2018   Overview:  shoulder  . Bacteremia due to Enterococcus 03/21/2020  . Cervical myelopathy ([place]) 04/22/2019  . Conductive hearing loss of left ear 11/13/2019  . Coronary artery disease involving native coronary artery of native heart without angina pectoris 05/30/2016  . Coronary artery disease of native artery of native heart with stable angina pectoris ([place])    pt. denies  . Diabetes mellitus due to underlying condition with unspecified complications ([place]) 7/82/9562  . Discitis 04/07/2020  . Dyslipidemia 05/30/2016  . Dyspnea    upon exertion,  as of 06/05/09 not having any recent chest pain  . Dysrhythmia    a fib  .  Endocarditis 04/13/2020  . Essential hypertension 05/30/2016  . Heart murmur    Has had an Aortic valve replacement  . History of fusion of cervical spine 03/23/2020  . Hyperactive gag reflex 02/15/2018  . Hypothyroidism   . Intermittent claudication ([place]) 10/31/2018  . Lumbar stenosis with neurogenic claudication 06/11/2019  . Macular degeneration 02/15/2018  . PAF (paroxysmal atrial fibrillation) ([place]) 05/30/2016  . Pre-operative cardiovascular examination 01/03/2020  . Preoperative cardiovascular examination 04/17/2019  . Prosthetic  valve endocarditis ([place]) 04/13/2020  . Protein-calorie malnutrition, severe 03/24/2020  . Pseudogout of left knee 03/21/2020  . Renal insufficiency 01/17/2020  . S/P aortic valve replacement 01/17/2020  . S/P AVR 01/17/2020  . Septic arthritis ([place]) 03/23/2020  . Tinnitus of left ear 11/13/2019  . Typical atrial flutter (HCC)     Social History   Tobacco Use  . Smoking status: Never Smoker  . Smokeless tobacco: Never Used  Vaping Use  . Vaping Use: Never used  Substance Use Topics  . Alcohol use: Not Currently  . Drug use: Never    Family History  Problem Relation Age of Onset  . Prostate cancer Brother   . Heart disease Maternal Grandmother   . Brain cancer Mother     Allergies  Allergen Reactions  . Atorvastatin Other (See Comments)    Urinary retention   . Nebivolol Diarrhea    Physical Exam: Constitutional: in no apparent distress  Vitals:   05/03/20 0645 05/03/20 1244  BP: 132/81 109/83  Pulse: (!) 119 (!) 104  Resp: 16   Temp: 98.3 F (36.8 C)   SpO2: 98%    EYES: anicteric Cardiovascular: irr irr Respiratory: clear; Musculoskeletal: wrapped, + bilateral VAC Skin: no rash Hematologic: no cervical lad  Lab Results  Component Value Date   WBC 9.6 05/03/2020   HGB 7.5 (L) 05/03/2020   HCT 22.6 (L) 05/03/2020   MCV 89.3 05/03/2020   PLT 250 05/03/2020    Lab Results  Component Value Date   CREATININE 1.44 (H) 05/03/2020   BUN 25 (H) 05/03/2020   NA 134 (L) 05/03/2020   K 4.6 05/03/2020   CL 102 05/03/2020   CO2 24 05/03/2020    Lab Results  Component Value Date   ALT 58 (H) 05/01/2020   AST 44 (H) 05/01/2020   ALKPHOS 126 05/01/2020     Microbiology: Recent Results (from the past 240 hour(s))  Body fluid culture     Status: None (Preliminary result)   Collection Time: 04/30/20  5:28 PM   Specimen: Synovium; Body Fluid  Result Value Ref Range Status   Specimen Description SYNOVIAL LEFT KNEE  Final   Special Requests NONE  Final   Gram  Stain   Final    ABUNDANT WBC PRESENT, PREDOMINANTLY PMN NO ORGANISMS SEEN    Culture   Final    NO GROWTH 3 DAYS Performed at [facility], [address] [place], [place] [address]    Report Status PENDING  Incomplete  Aerobic/Anaerobic Culture (surgical/deep wound)     Status: None (Preliminary result)   Collection Time: 05/02/20  8:55 AM   Specimen: Soft Tissue, Other  Result Value Ref Range Status   Specimen Description TISSUE  Final   Special Requests LEFT KNEE TISSUE SPEC A  Final   Gram Stain   Final    ABUNDANT WBC PRESENT, PREDOMINANTLY PMN NO ORGANISMS SEEN    Culture   Final    NO GROWTH < 24 HOURS Performed at  [facility], [place] [address], [place], [place] [address]    Report Status PENDING  Incomplete  Aerobic/Anaerobic Culture (surgical/deep wound)     Status: None (Preliminary result)   Collection Time: 05/02/20  8:58 AM   Specimen: Soft Tissue, Other  Result Value Ref Range Status   Specimen Description TISSUE  Final   Special Requests RIGHT KNEE TISSUE SPEC B  Final   Gram Stain   Final    RARE WBC PRESENT,BOTH PMN AND MONONUCLEAR NO ORGANISMS SEEN    Culture   Final    NO GROWTH < 24 HOURS Performed at [facility], [place] [address] [place], [place] [address]    Report Status PENDING  Incomplete    Thayer Headings, [facility] [place] [email or web address] 05/03/2020, 1:21 PM

## 2020-05-03 NOTE — Progress Notes (Signed)
Patient ID: Johnathan Murray, male   DOB: 12-Oct-1951, 68 y.o.   MRN: [id number] Postoperative day 1 status post open debridement both knees with extensive synovectomy.  Patient denies any pain in the right knee no drainage in the wound VAC canister on the right.  Patient has filled up about 400 cc of drainage in the portable Praveena pump on the left.  Patient may progressively ambulate with physical therapy no restrictions weightbearing as tolerated bilaterally.  I have placed an order for patient to be placed on the hospital wound VAC pump for the left knee.  Reinforce dressing as needed plan to change dressing prior to discharge.  Tissue cultures results have not been posted yet.

## 2020-05-03 NOTE — Progress Notes (Signed)
05/03/20 1 unit of PRBC was administered.

## 2020-05-03 NOTE — Anesthesia Postprocedure Evaluation (Signed)
Anesthesia Post Note  Patient: Johnathan Murray  Procedure(s) Performed: SYNOVECTOMY (Bilateral ) IRRIGATION AND DEBRIDEMENT BILATERAL KNEES (Bilateral )     Patient location during evaluation: PACU Anesthesia Type: General Level of consciousness: awake and alert Pain management: pain level controlled Vital Signs Assessment: post-procedure vital signs reviewed and stable Respiratory status: spontaneous breathing, nonlabored ventilation, respiratory function stable and patient connected to nasal cannula oxygen Cardiovascular status: blood pressure returned to baseline and stable Postop Assessment: no apparent nausea or vomiting Anesthetic complications: no   No complications documented.  Last Vitals:  Vitals:   05/02/20 1436 05/02/20 1630  BP: (!) 90/56 92/65  Pulse: (!) 116 86  Resp:  18  Temp: 36.6 C 36.6 C  SpO2: 100% 100%    Last Pain:  Vitals:   05/02/20 1436  TempSrc: Oral  PainSc:                  [name]

## 2020-05-03 NOTE — Progress Notes (Signed)
PROGRESS NOTE        PATIENT DETAILS Name: Johnathan Murray Age: 69 y.o. Sex: male Date of Birth: 04-Jul-1951 Admit Date: 05/02/2020 Admitting Physician Norval Morton, MD PJA:[id number], [name], MD  Brief Narrative: Patient is a 69 y.o. 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  Patient was seen by cardiothoracic surgery-underwent debridement of aortic root abscess/aortic valve replacement with bioprosthetic valve-ID was consulted-patient was subsequently discharged to CIR on 6/10.  While at CIR-patient started having gradually worsening anemia, bilateral knee pain/swelling-arthrocentesis was suggestive of septic knee-patient subsequently underwent irrigation/debridement and total synovectomy of bilateral knees-post procedure-patient was noted to have A. fib with RVR and severe anemia-and readmitted to the hospitalist service.  See below for further details.  Significant events: 6/12>> admit to [facility] for A. fib with RVR, severe anemia following bilateral knee washout 6/10-6/12>> admitted by CIR-found to have bilateral septic knees 5/24-6/10>> admitted to [facility] from CIR-after he was found to have prosthetic aortic valve vegetation with aortic root abscess during TEE cardioversion-underwent aortic root replacement/reimplantation of coronary arteries managed 5/18-5/24>> admit to CIR 4/29-5/18>> admit to [facility] for sepsis secondary to Enterococcus faecalis, found to have acute osteo-/discitis of L/-/5, left knee septic  arthritis-discharge to CIR.  Antimicrobial therapy: Ampicillin: 5/18>> Rocephin: 5/19>>  Microbiology data: 6/12>> knee culture: Pending 6/10>> left knee synovial fluid culture: No growth  5/26>> blood culture: No growth 5/18>> L4-L5 disc aspirate: Enterococcus faecalis 5/6>> blood culture: No growth 5/3>> blood culture: No growth 5/2>> right knee synovial fluid: No growth 5/1>> left knee synovial fluid: Enterococcus faecalis 4/30>> blood culture: Enterococcus faecalis  Procedures : 6/12>> irrigation/debridement with total thymectomy of bilateral knees 6/9>> TEE guided cardioversion 5/27>>S/p Redo median sternotomy, rightaxillaryartery graftfor cardiopulmonary bypass, extracorporeal circulation, debridement of aortic root abscess and pseudoaneurysm, and aortic root replacement using a 18mm Homograft aortic root with reimplantation of coronary arteries. 5/24>> TEE guided cardioversion.  Found to have vegetation on prosthetic aortic valve on TEE. 5/4>> B/L knee irrigation irrigation debridement arthroscopic. B/L knee medial and lateral menisectomies.  Consults: ID [facility] Cardiology Ortho  DVT Prophylaxis : SCD's  Subjective: Had significant bloody drainage into the vacuum from left knee-around 400 cc overnight.  Assessment/Plan: Hemorrhagic shock with acute blood loss anemia superimposed on anemia of chronic disease: From left knee bleeding-BP stable-transfused 3 units of PRBC on 6/12-hemoglobin also improved-still at 7.5-given extensive cardiac history-we will go and transfuse 1 additional unit.  Anticoagulation remains on hold.  Spoke with Dr. Joice Lofts to hold Eliquis for at least 2-3 days-until bloody drainage from left knee ceases  A. fib with RVR: Remains in A. fib this morning-with rates in the low 100s-has undergone cardioversion x2 on 6/9 and 5/24.  Remains on amiodarone-anticoagulation on hold due to severity of anemia/bleeding.  Have consulted  cardiology.  Enterococcus bacteremia-with L4-L5 discitis, bilateral septic knee, prosthetic valve vegetation with aortic root abscess: Remains on IV ampicillin/Rocephin-have consulted ID.  CAD-s/p CABG-no anginal symptoms-antiplatelets/anticoagulation on hold.  Hypothyroidism: Continue levothyroxine  DM-2 with uncontrolled hyperglycemia:  Increase Lantus to 10 units twice daily, continue SSI-follow and adjust.  CBG (last 3)  Recent Labs    05/02/20 1630 05/02/20 2046 05/03/20 0740  GLUCAP 319* 299* 272*   Debility/deconditioning: Secondary to acute illness-we will need PT/OT-possibly back to CIR at some point.   Diet: Diet Order            Diet Carb Modified Fluid consistency: Thin; Room service appropriate? Yes  Diet effective now                Code Status: Full code   Family Communication: Spouse at bedside  Disposition Plan: Status is: Inpatient  Remains inpatient appropriate because:Inpatient level of care appropriate due to severity of illness  Dispo: The patient is from: Home              Anticipated d/c is to: CIR              Anticipated d/c date is: > 3 days              Patient currently is not medically stable to d/c.   Barriers to Discharge: Hemorrhagic shock with acute blood loss anemia-bilateral septic arthritis of knees-on IV antibiotics-remains A. fib with RVR.  Antimicrobial agents: Anti-infectives (From admission, onward)   Start     Dose/Rate Route Frequency Ordered Stop   05/03/20 0500  cefTRIAXone (ROCEPHIN) 2 g in sodium chloride 0.9 % 100 mL IVPB     Discontinue     2 g 200 mL/hr over 30 Minutes Intravenous Every 12 hours 05/02/20 2155     05/02/20 2245  cefTRIAXone (ROCEPHIN) 2 g in sodium chloride 0.9 % 100 mL IVPB  Status:  Discontinued        2 g 200 mL/hr over 30 Minutes Intravenous Every 12 hours 05/02/20 1943 05/02/20 2155   05/02/20 1800  ampicillin (OMNIPEN) 2 g in sodium chloride 0.9 % 100 mL IVPB     Discontinue     2 g 300  mL/hr over 20 Minutes Intravenous Every 4 hours 05/02/20 1745         Time spent: 35 minutes-Greater than 50% of this time was spent in counseling, explanation of diagnosis, planning of further management, and coordination of care.  MEDICATIONS: Scheduled Meds: . sodium chloride   Intravenous Once  . amiodarone  400 mg Oral BID  . Chlorhexidine Gluconate Cloth  6 each Topical Daily  . docusate sodium  100 mg Oral BID  . feeding supplement (ENSURE ENLIVE)  237 mL Oral TID BM  . ferrous [id number] C-folic acid  1 capsule Oral Q breakfast  . insulin aspart  0-20 Units Subcutaneous TID WC  . insulin aspart  0-5 Units Subcutaneous QHS  . insulin glargine  5 Units Subcutaneous BID  . levothyroxine  25 mcg Oral QAC breakfast  . oxyCODONE  10 mg Oral Q12H  . pregabalin  100 mg Oral TID  . senna  1 tablet Oral QHS  . sodium chloride flush  10-40 mL Intracatheter Q12H  . sodium chloride flush  3 mL Intravenous Q12H   Continuous Infusions: . ampicillin (OMNIPEN) IV 2 g (05/03/20 0823)  . cefTRIAXone (ROCEPHIN) IVPB 2 gram/100 mL NS (Mini-Bag Plus) 2 g (05/03/20 0653)   PRN Meds:.acetaminophen **OR** acetaminophen, albuterol, cyclobenzaprine, oxyCODONE, sodium chloride flush   PHYSICAL EXAM: Vital signs: Vitals:   05/03/20 0405 05/03/20 0428 05/03/20 0600 05/03/20 0645  BP:  (!) 133/94 109/75 132/81  Pulse:  (!) 116 (!) 102 Marland Kitchen)  119  Resp:  18 12 16   Temp:  98.4 F (36.9 C) 98.3 F (36.8 C) 98.3 F (36.8 C)  TempSrc:  Oral Oral Oral  SpO2:  100% 97% 98%  Weight: 88.7 kg      Filed Weights   05/03/20 0405  Weight: 88.7 kg   Body mass index is 28.06 kg/m.   Gen Exam:Alert awake-not in any distress HEENT:atraumatic, normocephalic Chest: B/L clear to auscultation anteriorly CVS:S1S2 irregular Abdomen:soft non tender, non distended Extremities:no edema-both knees covered with dressing/drains in place. Neurology: Non focal Skin: no rash  I have personally  reviewed following labs and imaging studies  LABORATORY DATA: CBC: Recent Labs  Lab 04/29/20 0500 05/01/20 0420 05/02/20 2252 05/03/20 0845  WBC 7.8 6.8  --  9.6  NEUTROABS  --  4.8  --   --   HGB 8.3* 7.4* 5.6* 7.5*  HCT 27.1* 24.7* 17.7* 22.6*  MCV 95.4 96.5  --  89.3  PLT 328 283  --  381    Basic Metabolic Panel: Recent Labs  Lab 04/29/20 0500 05/01/20 0420 05/03/20 0845  NA 137 135 134*  K 3.7 4.0 4.6  CL 102 102 102  CO2 25 27 24   GLUCOSE 121* 102* 269*  BUN 16 15 25*  CREATININE 1.28* 1.25* 1.44*  CALCIUM 8.7* 8.7* 8.3*  MG  --   --  1.9    GFR: Estimated Creatinine Clearance: 55.8 mL/min (A) (by C-G formula based on SCr of 1.44 mg/dL (H)).  Liver Function Tests: Recent Labs  Lab 05/01/20 0420  AST 44*  ALT 58*  ALKPHOS 126  BILITOT 0.8  PROT 5.7*  ALBUMIN 2.0*   No results for input(s): LIPASE, AMYLASE in the last 168 hours. No results for input(s): AMMONIA in the last 168 hours.  Coagulation Profile: No results for input(s): INR, PROTIME in the last 168 hours.  Cardiac Enzymes: No results for input(s): CKTOTAL, CKMB, CKMBINDEX, TROPONINI in the last 168 hours.  BNP (last 3 results) No results for input(s): PROBNP in the last 8760 hours.  Lipid Profile: No results for input(s): CHOL, HDL, LDLCALC, TRIG, CHOLHDL, LDLDIRECT in the last 72 hours.  Thyroid Function Tests: No results for input(s): TSH, T4TOTAL, FREET4, T3FREE, THYROIDAB in the last 72 hours.  Anemia Panel: No results for input(s): VITAMINB12, FOLATE, FERRITIN, TIBC, IRON, RETICCTPCT in the last 72 hours.  Urine analysis:    Component Value Date/Time   COLORURINE YELLOW 04/11/2020 2245   APPEARANCEUR CLEAR 04/11/2020 2245   LABSPEC 1.009 04/11/2020 2245   PHURINE 7.0 04/11/2020 2245   GLUCOSEU NEGATIVE 04/11/2020 2245   HGBUR NEGATIVE 04/11/2020 2245   BILIRUBINUR NEGATIVE 04/11/2020 2245   KETONESUR NEGATIVE 04/11/2020 2245   PROTEINUR NEGATIVE 04/11/2020 2245    NITRITE NEGATIVE 04/11/2020 2245   LEUKOCYTESUR NEGATIVE 04/11/2020 2245    Sepsis Labs: Lactic Acid, Venous    Component Value Date/Time   LATICACIDVEN 1.0 03/20/2020 1517    MICROBIOLOGY: Recent Results (from the past 240 hour(s))  Body fluid culture     Status: None (Preliminary result)   Collection Time: 04/30/20  5:28 PM   Specimen: Synovium; Body Fluid  Result Value Ref Range Status   Specimen Description SYNOVIAL LEFT KNEE  Final   Special Requests NONE  Final   Gram Stain   Final    ABUNDANT WBC PRESENT, PREDOMINANTLY PMN NO ORGANISMS SEEN    Culture   Final    NO GROWTH 3 DAYS Performed at [facility]  Lab, [address] [place], [place] [address]    Report Status PENDING  Incomplete  Aerobic/Anaerobic Culture (surgical/deep wound)     Status: None (Preliminary result)   Collection Time: 05/02/20  8:55 AM   Specimen: Soft Tissue, Other  Result Value Ref Range Status   Specimen Description TISSUE  Final   Special Requests LEFT KNEE TISSUE SPEC A  Final   Gram Stain   Final    ABUNDANT WBC PRESENT, PREDOMINANTLY PMN NO ORGANISMS SEEN    Culture   Final    NO GROWTH < 24 HOURS Performed at [facility], [place] [address] [place], [place] [address]    Report Status PENDING  Incomplete  Aerobic/Anaerobic Culture (surgical/deep wound)     Status: None (Preliminary result)   Collection Time: 05/02/20  8:58 AM   Specimen: Soft Tissue, Other  Result Value Ref Range Status   Specimen Description TISSUE  Final   Special Requests RIGHT KNEE TISSUE SPEC B  Final   Gram Stain   Final    RARE WBC PRESENT,BOTH PMN AND MONONUCLEAR NO ORGANISMS SEEN    Culture   Final    NO GROWTH < 24 HOURS Performed at [facility], [place] [address], [place], [place] [address]    Report Status PENDING  Incomplete    RADIOLOGY STUDIES/RESULTS: No results found.   LOS: 1 day   Oren Binet, MD  [facility]    To contact the attending provider between  7A-7P or the covering provider during after hours 7P-7A, please log into the web site [email or web address] and access using universal [place] password for that web site. If you do not have the password, please call the hospital operator.  05/03/2020, 10:54 AM

## 2020-05-03 NOTE — Evaluation (Signed)
Physical Therapy Evaluation Patient Details Name: Johnathan Murray MRN: [id number] DOB: Sep 29, 1951 Today's Date: 05/03/2020   History of Present Illness  Patient is a 69 y.o. 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  Patient was seen by cardiothoracic surgery-underwent debridement of aortic root abscess/aortic valve replacement with bioprosthetic valve-ID was consulted-patient was subsequently discharged to CIR on 6/10.  While at CIR-patient started having gradually worsening anemia, bilateral knee pain/swelling-arthrocentesis was suggestive of septic knee-patient subsequently underwent irrigation/debridement and total synovectomy of bilateral knees-post procedure-patient was noted to have A. fib with RVR and severe anemia-and readmitted to the hospitalist service.  Clinical Impression   Patient is s/p above surgery and recent medical history as outlined above resulting in functional limitations due to the deficits listed below (see PT Problem List). He was originally admitted from home, where he lives withhis wife, independent prior to the beginning of this hospital ordeal; Presents to PT today with tachycardia, decr functional mobility, decr activity tolerance, notable incr drainage from L knee, and low Hgb (receiving transfusion); BP soft as well, and combined with tachycardia, opted not to push OOB today; Mod assist to help with rolling R and L for bedpan, hygeine, and positioning; Although painful, [name] has demonstrated the ability to participate  despite pain, he is familiar with the rigors of CIR, and hopes to return to CIR soon;  Patient will benefit from skilled PT to increase their independence and safety with mobility to allow discharge to the venue listed below.       Follow Up Recommendations CIR    Equipment Recommendations  None recommended by PT    Recommendations for Other Services       Precautions / Restrictions Precautions Precautions: Sternal;Fall Precaution Comments: ACDF on 4/12, pt reports they d/c-ed collar 1 week after surgery Spinal Brace: Lumbar corset Other Brace: for comfort, pt reports "it doesn't really help me" so we didn't use back brace Restrictions Other Position/Activity Restrictions: sternal precautions      Mobility  Bed Mobility Overal bed mobility: Needs Assistance Bed Mobility: Rolling Rolling: Mod assist         General bed mobility comments: Bed mobility limited to rolling today due to pain, tachycardia, and drainage from dressings; Assisted pt in rolling to place bedpan and help with hygeine; required R knee support with rolling due to pain  Transfers                    Ambulation/Gait                Stairs            Wheelchair Mobility    Modified Rankin (Stroke Patients Only)       Balance                                             Pertinent Vitals/Pain Pain Assessment: Faces Faces Pain Scale: Hurts whole lot Pain Location: Bil knees, R knee worse than L Pain Descriptors / Indicators: Grimacing;Guarding;Moaning;Sore;Discomfort;Crushing  Pain Intervention(s): Monitored during session    Home Living Family/patient expects to be discharged to:: Private residence Living Arrangements: Spouse/significant other Available Help at Discharge: Family;Available 24 hours/day Type of Home: House Home Access: Stairs to enter Entrance Stairs-Rails: Psychiatric nurse of Steps: 8 Home Layout: One level Home  Equipment: Grab bars - tub/shower;Shower seat - built in;Cane - single point;Walker - 2 wheels Additional Comments: Grab bars in shower with built in seat, no grab bars near toilet    Prior Function Level of Independence: Independent   Gait / Transfers Assistance Needed: Prior to admission on 4/28 pt amb with RW or cane     Comments: worked as a Tax inspector prior to [place]: Right    Extremity/Trunk Assessment   Upper Extremity Assessment Upper Extremity Assessment: Defer to OT evaluation    Lower Extremity Assessment Lower Extremity Assessment: Generalized weakness;RLE deficits/detail;LLE deficits/detail RLE Deficits / Details: Ankle, Hip grossly WFL; substantial VAC dressing surrounding R knee; very painful RLE: Unable to fully assess due to pain LLE Deficits / Details: anke and hip grossly WFL; knee with substantial VAC dressing     Cervical / Trunk Assessment Cervical / Trunk Exceptions: s/p ACDF on 4/12; low back pain  Communication   Communication: No difficulties  Cognition Arousal/Alertness: Awake/alert Behavior During Therapy: WFL for tasks assessed/performed Overall Cognitive Status: Within Functional Limits for tasks assessed                                        General Comments      Exercises     Assessment/Plan    PT Assessment Patient needs continued PT services  PT Problem List Decreased strength;Decreased activity tolerance;Decreased balance;Decreased mobility;Pain;Decreased knowledge of precautions       PT Treatment Interventions DME instruction;Gait training;Stair training;Functional mobility training;Therapeutic activities;Therapeutic exercise;Balance training;Patient/family education    PT Goals (Current goals can be found in the Care Plan section)  Acute Rehab PT Goals Patient Stated Goal: to get stronger and go back to work PT Goal Formulation: With patient/family Time  For Goal Achievement: 05/17/20 Potential to Achieve Goals: Good    Frequency Min 3X/week   Barriers to discharge        Co-evaluation               AM-PAC PT "6 Clicks" Mobility  Outcome Measure Help needed turning from your back to your side while in a flat bed without using bedrails?: A Little Help needed moving from lying on your back to sitting on the side of a flat bed without using bedrails?: A Lot Help needed moving to and from a bed to a chair (including a wheelchair)?: A Lot Help needed standing up from a chair using your arms (e.g., wheelchair or bedside chair)?: A Lot Help needed to walk in hospital room?: A Lot Help needed climbing 3-5 steps with a railing? : Total 6 Click Score: 12    End of Session Equipment Utilized During Treatment: Other (comment) (bed pad) Activity Tolerance: Patient limited by pain Patient left: in bed;with call bell/phone within reach;with family/visitor present Nurse Communication: Mobility status PT Visit Diagnosis: Other abnormalities of gait and mobility (R26.89);Muscle weakness (generalized) (M62.81);Pain Pain - Right/Left: Right Pain - part of body: Knee (and back)    Time: 1450-1505 (in and out times are approximate) PT Time  Calculation (min) (ACUTE ONLY): 15 min   Charges:   PT Evaluation $PT Eval Moderate Complexity: 1 Mod          Roney [place], [place]  Acute Rehabilitation Services Pager [phone] Office [phone]   Colletta [place] 05/03/2020, 7:01 PM

## 2020-05-03 NOTE — Progress Notes (Signed)
Hospital wound vac was applied to left knee and dressing was reinforced per order. Suction was set at 125.  Will continue to monitor remaining of shift.

## 2020-05-04 ENCOUNTER — Inpatient Hospital Stay (HOSPITAL_COMMUNITY): Payer: BC Managed Care – PPO | Admitting: Occupational Therapy

## 2020-05-04 ENCOUNTER — Inpatient Hospital Stay (HOSPITAL_COMMUNITY): Payer: BC Managed Care – PPO

## 2020-05-04 DIAGNOSIS — M00861 Arthritis due to other bacteria, right knee: Secondary | ICD-10-CM

## 2020-05-04 DIAGNOSIS — M00862 Arthritis due to other bacteria, left knee: Secondary | ICD-10-CM

## 2020-05-04 DIAGNOSIS — B952 Enterococcus as the cause of diseases classified elsewhere: Secondary | ICD-10-CM

## 2020-05-04 DIAGNOSIS — R7881 Bacteremia: Secondary | ICD-10-CM | POA: Diagnosis not present

## 2020-05-04 DIAGNOSIS — M4646 Discitis, unspecified, lumbar region: Secondary | ICD-10-CM | POA: Diagnosis present

## 2020-05-04 DIAGNOSIS — T826XXA Infection and inflammatory reaction due to cardiac valve prosthesis, initial encounter: Secondary | ICD-10-CM

## 2020-05-04 DIAGNOSIS — I480 Paroxysmal atrial fibrillation: Secondary | ICD-10-CM | POA: Diagnosis not present

## 2020-05-04 DIAGNOSIS — E088 Diabetes mellitus due to underlying condition with unspecified complications: Secondary | ICD-10-CM | POA: Diagnosis not present

## 2020-05-04 DIAGNOSIS — M009 Pyogenic arthritis, unspecified: Secondary | ICD-10-CM | POA: Diagnosis not present

## 2020-05-04 DIAGNOSIS — T826XXD Infection and inflammatory reaction due to cardiac valve prosthesis, subsequent encounter: Secondary | ICD-10-CM | POA: Diagnosis not present

## 2020-05-04 DIAGNOSIS — D62 Acute posthemorrhagic anemia: Secondary | ICD-10-CM | POA: Diagnosis not present

## 2020-05-04 HISTORY — DX: Discitis, unspecified, lumbar region: M46.46

## 2020-05-04 LAB — CBC
HCT: 20.5 % — ABNORMAL LOW (ref 39.0–52.0)
HCT: 21.5 % — ABNORMAL LOW (ref 39.0–52.0)
HCT: 22.7 % — ABNORMAL LOW (ref 39.0–52.0)
Hemoglobin: 6.8 g/dL — CL (ref 13.0–17.0)
Hemoglobin: 7.2 g/dL — ABNORMAL LOW (ref 13.0–17.0)
Hemoglobin: 7.5 g/dL — ABNORMAL LOW (ref 13.0–17.0)
MCH: 27.9 pg (ref 26.0–34.0)
MCH: 28.3 pg (ref 26.0–34.0)
MCH: 29.6 pg (ref 26.0–34.0)
MCHC: 33 g/dL (ref 30.0–36.0)
MCHC: 33.2 g/dL (ref 30.0–36.0)
MCHC: 33.5 g/dL (ref 30.0–36.0)
MCV: 84.4 fL (ref 80.0–100.0)
MCV: 85.4 fL (ref 80.0–100.0)
MCV: 88.5 fL (ref 80.0–100.0)
Platelets: 220 10*3/uL (ref 150–400)
Platelets: 231 10*3/uL (ref 150–400)
Platelets: 252 10*3/uL (ref 150–400)
RBC: 2.4 MIL/uL — ABNORMAL LOW (ref 4.22–5.81)
RBC: 2.43 MIL/uL — ABNORMAL LOW (ref 4.22–5.81)
RBC: 2.69 MIL/uL — ABNORMAL LOW (ref 4.22–5.81)
RDW: 14.8 % (ref 11.5–15.5)
RDW: 19.6 % — ABNORMAL HIGH (ref 11.5–15.5)
RDW: 19.6 % — ABNORMAL HIGH (ref 11.5–15.5)
WBC: 10.2 10*3/uL (ref 4.0–10.5)
WBC: 10.4 10*3/uL (ref 4.0–10.5)
WBC: 10.8 10*3/uL — ABNORMAL HIGH (ref 4.0–10.5)
nRBC: 0 % (ref 0.0–0.2)
nRBC: 0 % (ref 0.0–0.2)
nRBC: 0 % (ref 0.0–0.2)

## 2020-05-04 LAB — BASIC METABOLIC PANEL
Anion gap: 9 (ref 5–15)
BUN: 24 mg/dL — ABNORMAL HIGH (ref 8–23)
CO2: 27 mmol/L (ref 22–32)
Calcium: 8.1 mg/dL — ABNORMAL LOW (ref 8.9–10.3)
Chloride: 97 mmol/L — ABNORMAL LOW (ref 98–111)
Creatinine, Ser: 1.38 mg/dL — ABNORMAL HIGH (ref 0.61–1.24)
GFR calc Af Amer: >60 mL/min (ref >60)
GFR calc non Af Amer: 53 mL/min — ABNORMAL LOW (ref >60)
Glucose, Bld: 200 mg/dL — ABNORMAL HIGH (ref 70–99)
Potassium: 4.4 mmol/L (ref 3.5–5.1)
Sodium: 133 mmol/L — ABNORMAL LOW (ref 135–145)

## 2020-05-04 LAB — GLUCOSE, CAPILLARY
Glucose-Capillary: 188 mg/dL — ABNORMAL HIGH (ref 70–99)
Glucose-Capillary: 220 mg/dL — ABNORMAL HIGH (ref 70–99)
Glucose-Capillary: 166 mg/dL — ABNORMAL HIGH (ref 70–99)

## 2020-05-04 LAB — BODY FLUID CULTURE: Culture: NO GROWTH

## 2020-05-04 LAB — PREPARE RBC (CROSSMATCH)

## 2020-05-04 MED ORDER — SODIUM CHLORIDE 0.9% IV SOLUTION: Freq: Once | INTRAVENOUS | Status: AC

## 2020-05-04 MED ORDER — POLYETHYLENE GLYCOL 3350 17 G PO PACK
17.0000 g | PACK | Freq: Every day | ORAL | Status: DC
  Administered 2020-05-04 – 2020-05-14 (×10): 17 g via ORAL
  Filled 2020-05-04 (×9): qty 1

## 2020-05-04 MED ORDER — ACETAMINOPHEN 325 MG PO TABS
650.0000 mg | ORAL_TABLET | Freq: Once | ORAL | Status: AC
  Administered 2020-05-04: 650 mg via ORAL
  Filled 2020-05-04: qty 2

## 2020-05-04 MED ORDER — FUROSEMIDE 10 MG/ML IJ SOLN
20.0000 mg | Freq: Once | INTRAMUSCULAR | Status: AC
  Administered 2020-05-04: 20 mg via INTRAVENOUS
  Filled 2020-05-04: qty 2

## 2020-05-04 MED ORDER — DIPHENHYDRAMINE HCL 50 MG/ML IJ SOLN
25.0000 mg | Freq: Once | INTRAMUSCULAR | Status: AC
  Administered 2020-05-04: 25 mg via INTRAVENOUS
  Filled 2020-05-04: qty 1

## 2020-05-04 NOTE — Progress Notes (Signed)
Trying to get a hold of phelbotomy for this patients CBC that's been ordered since this morning.  I've attempted 3 times already with no answer.  Trying another extension. Will contact the Phlebotomy supervisor on the next attempt.

## 2020-05-04 NOTE — Progress Notes (Signed)
Patient ID: Johnathan Murray, male   DOB: 1951/09/08, 69 y.o.   MRN: [id number]         [facility]  Date of Admission:  05/02/2020   Total days of antibiotics 76         ASSESSMENT: I am not sure why he has persistent/recurrent bilateral knee synovitis 2-1/2 months into therapy for enterococcal septic arthritis.  I wonder if this could be due to recurrent pseudogout as opposed to infection.  PLAN: 1. Continue ampicillin and ceftriaxone 2. Await results of final cultures  Principal Problem:   Bacteremia due to Enterococcus Active Problems:   S/P AVR   Septic arthritis of knee, bilateral (HCC)   Prosthetic valve endocarditis (HCC)   Lumbar discitis   Diabetes mellitus due to underlying condition with unspecified complications (HCC)   Dyslipidemia   PAF (paroxysmal atrial fibrillation) (HCC)   Acute blood loss anemia   Prolonged QT interval   Transient hypotension   Scheduled Meds: . sodium chloride   Intravenous Once  . amiodarone  400 mg Oral BID  . Chlorhexidine Gluconate Cloth  6 each Topical Daily  . docusate sodium  100 mg Oral BID  . feeding supplement (ENSURE ENLIVE)  237 mL Oral TID BM  . ferrous [id number] C-folic acid  1 capsule Oral Q breakfast  . insulin aspart  0-20 Units Subcutaneous TID WC  . insulin aspart  0-5 Units Subcutaneous QHS  . insulin glargine  10 Units Subcutaneous BID  . levothyroxine  25 mcg Oral QAC breakfast  . metoprolol tartrate  12.5 mg Oral BID  . oxyCODONE  10 mg Oral Q12H  . pregabalin  100 mg Oral TID  . senna  1 tablet Oral QHS  . sodium chloride flush  10-40 mL Intracatheter Q12H  . sodium chloride flush  3 mL Intravenous Q12H   Continuous Infusions: . ampicillin (OMNIPEN) IV 2 g (05/04/20 1516)  . cefTRIAXone (ROCEPHIN) IVPB 2 gram/100 mL NS (Mini-Bag Plus) 2 g (05/04/20 2992)   PRN Meds:.acetaminophen **OR** acetaminophen, albuterol, cyclobenzaprine, oxyCODONE, sodium chloride  flush   SUBJECTIVE: He tells me that he has had problems with arthritis in both knees since he was a teenager.  He recently developed worsening bilateral knee swelling while on therapy for enterococcal bacteremia, septic arthritis, lumbar infection and prosthetic aortic valve endocarditis.  He underwent redo aortic valve replacement and aortic root reconstruction on 04/16/2020.  Operative cultures were negative.    MRI showed evidence of bilateral synovitis without evidence of osteomyelitis.  His left knee was aspirated.  No organisms were seen on stain or culture.  Extracellular calcium pyrophosphate crystals were noted.  The synovial white blood cell count was 114,500 of which 96% were segmented neutrophils.  He underwent bilateral knee surgery 2 days ago.  No organisms were seen on stain and cultures remain negative.  The operative note indicates that there was extensive synovitis bilaterally.  Review of Systems: Review of Systems  Constitutional: Negative for chills, diaphoresis and fever.  Gastrointestinal: Negative for abdominal pain, diarrhea, nausea and vomiting.  Musculoskeletal: Positive for joint pain.    Allergies  Allergen Reactions  . Atorvastatin Other (See Comments)    Urinary retention   . Nebivolol Diarrhea    OBJECTIVE: Vitals:   05/04/20 0407 05/04/20 0410 05/04/20 0441 05/04/20 0758  BP: 100/69 100/69  109/64  Pulse: 92 92  98  Resp: 15 15  14   Temp: 98.3 F (36.8 C) 98.3 F (36.8 C)  99 F (37.2 C)  TempSrc:  Axillary  Oral  SpO2:  97%  99%  Weight:   93.1 kg    Body mass index is 29.45 kg/m.  Physical Exam Constitutional:      Comments: He is resting quietly in bed.  Cardiovascular:     Rate and Rhythm: Normal rate. Rhythm irregular.     Heart sounds: No murmur heard.   Pulmonary:     Effort: Pulmonary effort is normal.     Breath sounds: Normal breath sounds.  Musculoskeletal:     Comments: He has Ace wraps on both knees.  Psychiatric:         Mood and Affect: Mood normal.     Lab Results Lab Results  Component Value Date   WBC 10.8 (H) 05/04/2020   HGB 7.5 (L) 05/04/2020   HCT 22.7 (L) 05/04/2020   MCV 84.4 05/04/2020   PLT 252 05/04/2020    Lab Results  Component Value Date   CREATININE 1.38 (H) 05/04/2020   BUN 24 (H) 05/04/2020   NA 133 (L) 05/04/2020   K 4.4 05/04/2020   CL 97 (L) 05/04/2020   CO2 27 05/04/2020    Lab Results  Component Value Date   ALT 58 (H) 05/01/2020   AST 44 (H) 05/01/2020   ALKPHOS 126 05/01/2020   BILITOT 0.8 05/01/2020     Microbiology: Recent Results (from the past 240 hour(s))  Body fluid culture     Status: None   Collection Time: 04/30/20  5:28 PM   Specimen: Synovium; Body Fluid  Result Value Ref Range Status   Specimen Description SYNOVIAL LEFT KNEE  Final   Special Requests NONE  Final   Gram Stain   Final    ABUNDANT WBC PRESENT, PREDOMINANTLY PMN NO ORGANISMS SEEN    Culture   Final    NO GROWTH Performed at [facility], [address], [place], [place] [address]    Report Status 05/04/2020 FINAL  Final  Aerobic/Anaerobic Culture (surgical/deep wound)     Status: None (Preliminary result)   Collection Time: 05/02/20  8:55 AM   Specimen: Soft Tissue, Other  Result Value Ref Range Status   Specimen Description TISSUE  Final   Special Requests LEFT KNEE TISSUE SPEC A  Final   Gram Stain   Final    ABUNDANT WBC PRESENT, PREDOMINANTLY PMN NO ORGANISMS SEEN    Culture   Final    NO GROWTH 2 DAYS NO ANAEROBES ISOLATED; CULTURE IN PROGRESS FOR 5 DAYS Performed at [facility], [place] [address] [place], [place] [address]    Report Status PENDING  Incomplete  Aerobic/Anaerobic Culture (surgical/deep wound)     Status: None (Preliminary result)   Collection Time: 05/02/20  8:58 AM   Specimen: Soft Tissue, Other  Result Value Ref Range Status   Specimen Description TISSUE  Final   Special Requests RIGHT KNEE TISSUE SPEC B  Final   Gram Stain   Final     RARE WBC PRESENT,BOTH PMN AND MONONUCLEAR NO ORGANISMS SEEN    Culture   Final    NO GROWTH 2 DAYS NO ANAEROBES ISOLATED; CULTURE IN PROGRESS FOR 5 DAYS Performed at [facility], [place] [address], [place], [place] [address]    Report Status PENDING  Incomplete    Michel Bickers, MD [facility] [phone] pager   [phone] cell 05/04/2020, 3:43 PM

## 2020-05-04 NOTE — Progress Notes (Signed)
Patient blood sugar was checked by techs but not relayed to this nurse that it was elevated.    Administering coverage at this time.

## 2020-05-04 NOTE — TOC Initial Note (Signed)
Transition of Care [facility]) - Initial/Assessment Note    Patient Details  Name: Johnathan Murray MRN: [id number] Date of Birth: 10-Feb-1951  Transition of Care [facility]) CM/SW Contact:    Ninfa Meeker, RN Phone Number: [phone] (working remotely) 05/04/2020, 12:58 PM  Clinical Narrative:    Patient is a 69 y.o. 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  Patient was seen by cardiothoracic surgery-underwent debridement of aortic root abscess/aortic valve replacement with bioprosthetic valve-ID was consulted-patient was subsequently discharged to CIR on 6/10.  While at CIR-patient started having gradually worsening anemia, bilateral knee pain/swelling-arthrocentesis was suggestive of septic knee-patient subsequently underwent irrigation/debridement and total synovectomy of bilateral knees-post procedure-patient was noted to have A. fib with RVR and severe anemia-and readmitted to the hospitalist service.   Prior to hospital admission patient was independent, lives home with wife.                             Patient Goals and CMS Choice        Expected Discharge Plan and Services:  To be determined                                                Prior Living Arrangements/Services                       Activities of Daily Living      Permission Sought/Granted                  Emotional Assessment              Admission diagnosis:  Acute blood loss anemia [D62] Patient Active Problem List    Diagnosis Date Noted  . Lumbar discitis 05/04/2020  . Acute blood loss anemia 05/02/2020  . Prolonged QT interval 05/02/2020  . Transient hypotension 05/02/2020  . Persistent atrial fibrillation ([place])   . Prosthetic valve endocarditis ([place]) 04/13/2020  . Aortic valve abscess 04/13/2020  . Endocarditis 04/13/2020  . Typical atrial flutter ([place])   . Discitis 04/07/2020  . Protein-calorie malnutrition, severe 03/24/2020  . Septic arthritis of knee, bilateral ([place]) 03/23/2020  . History of fusion of cervical spine 03/23/2020  . Bacteremia due to Enterococcus 03/21/2020  . Pseudogout of left knee 03/21/2020  . ARF (acute renal failure) ([place]) 03/20/2020  . S/P AVR 01/17/2020  . Renal insufficiency 01/17/2020  . Pre-operative cardiovascular examination 01/03/2020  . Acute otitis externa of left ear 11/13/2019  . Acute suppurative otitis media of left ear without spontaneous rupture of tympanic membrane 11/13/2019  . Conductive hearing loss of left ear 11/13/2019  . Tinnitus of left ear 11/13/2019  . Lumbar stenosis with neurogenic claudication 06/11/2019  . Cervical myelopathy ([place]) 04/22/2019  . Preoperative cardiovascular examination 04/17/2019  . Intermittent claudication ([place]) 10/31/2018  . Coronary artery disease of native artery of native heart with stable angina pectoris ([place])   . Abnormal nuclear stress test 05/03/2018  .  Aortic stenosis 05/03/2018  . Acid reflux 02/15/2018  . Arthritis 02/15/2018  . Hyperactive gag reflex 02/15/2018  . Macular degeneration 02/15/2018  . Abnormal LFTs (liver function tests) 10/31/2016  . Coronary artery disease involving native coronary artery of native heart without angina pectoris 05/30/2016  . Diabetes mellitus due to underlying condition with unspecified complications ([place]) 85/27/7824  . Dyslipidemia 05/30/2016  . Essential hypertension 05/30/2016  . PAF (paroxysmal atrial fibrillation) ([place]) 05/30/2016   PCP:  Nicoletta Dress,  MD Pharmacy:   [id number] - [place], [place] [address] Phone: [phone] Fax: [phone]     Social Determinants of Health (SDOH) Interventions    Readmission Risk Interventions Readmission Risk Prevention Plan 04/28/2020  Transportation Screening (No Data)  Social Work Consult for [place] Planning/Counseling [place] Not Applicable  Medication Review Press photographer) Complete  Some recent data might be hidden

## 2020-05-04 NOTE — Progress Notes (Signed)
Patient is postop day 2 status post bilateral irrigation and debridement of knees.  Left knee wound VAC is not functioning despite reinforcement  Both wound vacs were removed.  On the left side there was significant clotted blood.  No significant active bleeding however.  Bulky dressing was applied with some pressure.  Right knee wound VAC also had a bit of clot upon removal.  There is some small amount of bleeding to the central portion again reinforced dressing was applied will order daily dressing changes

## 2020-05-04 NOTE — Progress Notes (Addendum)
PROGRESS NOTE        PATIENT DETAILS Name: Johnathan Murray Age: 69 y.o. Sex: male Date of Birth: 06-23-1951 Admit Date: 05/02/2020 Admitting Physician Norval Morton, MD [id number], [name], MD  Brief Narrative: Patient is a 69 y.o. 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  Patient was seen by cardiothoracic surgery-underwent debridement of aortic root abscess/aortic valve replacement with bioprosthetic valve-ID was consulted-patient was subsequently discharged to CIR on 6/10.  While at CIR-patient started having gradually worsening anemia, bilateral knee pain/swelling-arthrocentesis was suggestive of septic knee-patient subsequently underwent irrigation/debridement and total synovectomy of bilateral knees-postoperatively-patient was noted to have A. fib with RVR and severe anemia-and readmitted to the hospitalist service.  See below for further details.  Significant events: 4/29-5/18>> admit by [facility] for sepsis secondary to Enterococcus faecalis, found to have acute osteo-/discitis of L/-/5, left knee septic arthritis-discharge to CIR  5/18-5/24>> at CIR- found to have aortic root abscess/prosthetic aortic valve vegetation on TEE 5/24-6/10>> admit by [facility] from CIR-underwent aortic root replacement/reimplantation of coronary arteries managed.  Subsequently discharged back to CIR. 6/10-6/12>> at [facility] diagnosed with recurrent bilateral septic arthritis 6/12-current>> admit to [facility] for A. fib with RVR, severe anemia  following bilateral knee washout for septic arthritis  Significant studies: 4/29>> CT abdomen/pelvis: No acute intra-abdominal or pelvic pathology 4/29>> CT C-spine: Interval removal of fixating plate/screws from C4-C6, no acute osseous abnormality 4/30>>left lower extremity Doppler: No DVT, ruptured Baker's cyst 5/3>> MRI C-spine: No evidence of infection, s/p C3-4 discectomy/fusion. 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  Procedures : 6/12>> irrigation/debridement with total thymectomy of bilateral knees 6/9>> TEE guided cardioversion 5/27>>S/p Redo median sternotomy, rightaxillaryartery graftfor cardiopulmonary bypass, extracorporeal circulation, debridement of aortic root abscess and pseudoaneurysm, and aortic root replacement using a 32mm Homograft aortic root with reimplantation of coronary arteries. 5/24>> TEE guided cardioversion.  Found to have vegetation on prosthetic aortic valve on TEE. 5/18>> fluoroscopy guided needle aspiration of L4-L5 disc space 5/4>> B/L knee irrigation irrigation debridement arthroscopic. B/L knee medial and lateral menisectomies.  Antimicrobial therapy: Ampicillin: 5/18>> Rocephin: 5/19>>  Microbiology data: 6/12>> knee culture: No growth 6/10>> left knee synovial fluid culture: No growth  5/27>> aortic valve tissue culture: No growth 5/26>>  blood culture: No growth 5/22>> urine culture: No growth 5/19>> left synovitic fluid culture: No growth 5/18>> L4-L5 disc aspirate: Enterococcus faecalis 5/6>> blood culture: No growth 5/3>> blood culture: No growth 5/2>> right knee synovial fluid: No growth 5/1>> left knee synovial fluid: Enterococcus faecalis 4/30>> blood culture: Enterococcus faecalis 4/29>> urine culture:  No growth  Consults: ID [facility] Cardiology Ortho  DVT Prophylaxis : SCD's  Subjective: Per orthopedics PA-C significant clotted blood at the left knee-VAC removed-compressive dressing applied.  Around 350 cc of bloody drainage output via VAC drain overnight.  Assessment/Plan: Hemorrhagic shock with acute blood loss anemia superimposed on anemia of chronic disease: From left knee bleeding-BP stable-transfused a total of 4 units of PRBC this admission alone-hemoglobin 7.5 today.  Per nursing staff-around 350 cc of bloody drainage overnight from mostly left knee.  Awaiting repeat CBC this afternoon-may require another unit of PRBC if significant drop in hemoglobin.  Per orthopedic PA-C-no active bleeding seen this morning upon VAC removal-some clots and some oozing but no active bleeding.  This MD had discussed with Dr. Joice Lofts to hold Eliquis until bleeding ceases.    A. fib with RVR: Rate controlled but remains in atrial fibrillation-continue amiodarone and low-dose beta-blocker.  Cardiology following.  Eliquis on hold due to severity of anemia and bleeding from left knee.    Enterococcus bacteremia-with L4-L5 discitis, bilateral septic knee, prosthetic valve vegetation with aortic root abscess: Remains on IV ampicillin/Rocephin-ID following-intraoperative synovial fluid cultures on 6/12 - so far.  Postop care regarding bilateral knees defer to orthopedics.  CAD-s/p CABG-no anginal symptoms-antiplatelets/anticoagulation on hold.  Hypothyroidism: Continue levothyroxine  DM-2 with uncontrolled hyperglycemia:  CBGs relatively stable-continue Lantus 10 units twice daily, SSI.  Follow and adjust.     Recent Labs    05/03/20 2055 05/04/20 0800 05/04/20 1223  GLUCAP 196* 188* 220*   Debility/deconditioning: Secondary to acute illness-we will need PT/OT-possibly back to CIR at some point.   Diet: Diet Order            Diet Carb Modified Fluid consistency: Thin; Room service appropriate? Yes  Diet effective now                Code Status: Full code   Family Communication: Spouse at bedside  Disposition Plan: Status is: Inpatient  Remains inpatient appropriate because:Inpatient level of care appropriate due to severity of illness  Dispo: The patient is from: Home              Anticipated d/c is to: CIR              Anticipated d/c date is: > 3 days              Patient currently is not medically stable to d/c.   Barriers to Discharge: Hemorrhagic shock with acute blood loss anemia-bilateral septic arthritis of knees-on IV antibiotics-remains A. fib with RVR.  Antimicrobial agents: Anti-infectives (From admission, onward)   Start     Dose/Rate Route Frequency Ordered Stop   05/03/20 0500  cefTRIAXone (ROCEPHIN) 2 g in sodium chloride 0.9 % 100 mL IVPB     Discontinue     2 g 200 mL/hr over 30 Minutes Intravenous Every 12 hours 05/02/20 2155     05/02/20 2245  cefTRIAXone (ROCEPHIN) 2 g in sodium chloride 0.9 % 100 mL IVPB  Status:  Discontinued        2 g 200 mL/hr over 30 Minutes Intravenous Every 12 hours 05/02/20 1943 05/02/20 2155   05/02/20 1800  ampicillin (OMNIPEN) 2 g in sodium chloride 0.9 % 100 mL IVPB     Discontinue     2 g 300 mL/hr over 20 Minutes Intravenous Every 4 hours 05/02/20 1745         Time spent: 25 minutes-Greater than 50% of this time was  spent in counseling, explanation of diagnosis, planning of further management, and coordination of care.  MEDICATIONS: Scheduled Meds: . sodium chloride   Intravenous Once  . amiodarone  400 mg Oral BID  .  Chlorhexidine Gluconate Cloth  6 each Topical Daily  . docusate sodium  100 mg Oral BID  . feeding supplement (ENSURE ENLIVE)  237 mL Oral TID BM  . ferrous [id number] C-folic acid  1 capsule Oral Q breakfast  . insulin aspart  0-20 Units Subcutaneous TID WC  . insulin aspart  0-5 Units Subcutaneous QHS  . insulin glargine  10 Units Subcutaneous BID  . levothyroxine  25 mcg Oral QAC breakfast  . metoprolol tartrate  12.5 mg Oral BID  . oxyCODONE  10 mg Oral Q12H  . pregabalin  100 mg Oral TID  . senna  1 tablet Oral QHS  . sodium chloride flush  10-40 mL Intracatheter Q12H  . sodium chloride flush  3 mL Intravenous Q12H   Continuous Infusions: . ampicillin (OMNIPEN) IV 2 g (05/04/20 1010)  . cefTRIAXone (ROCEPHIN) IVPB 2 gram/100 mL NS (Mini-Bag Plus) 2 g (05/04/20 1751)   PRN Meds:.acetaminophen **OR** acetaminophen, albuterol, cyclobenzaprine, oxyCODONE, sodium chloride flush   PHYSICAL EXAM: Vital signs: Vitals:   05/04/20 0407 05/04/20 0410 05/04/20 0441 05/04/20 0758  BP: 100/69 100/69  109/64  Pulse: 92 92  98  Resp: 15 15  14   Temp: 98.3 F (36.8 C) 98.3 F (36.8 C)  99 F (37.2 C)  TempSrc:  Axillary  Oral  SpO2:  97%  99%  Weight:   93.1 kg    Filed Weights   05/03/20 0405 05/04/20 0441  Weight: 88.7 kg 93.1 kg   Body mass index is 29.45 kg/m.   Gen Exam:Alert awake-not in any distress HEENT:atraumatic, normocephalic Chest: B/L clear to auscultation anteriorly CVS:S1S2 regular Abdomen:soft non tender, non distended Extremities:no edema-bilateral knees wrapped by orthopedics this morning. Neurology: Non focal Skin: no rash  I have personally reviewed following labs and imaging studies  LABORATORY DATA: CBC: Recent Labs  Lab 05/01/20 0420 05/01/20 0420 05/02/20 2252 05/03/20 0007 05/03/20 0845 05/03/20 2125 05/04/20 0600  WBC 6.8  --   --  10.4 9.6 10.2 10.8*  NEUTROABS 4.8  --   --   --   --   --   --   HGB 7.4*   < > 5.6* 7.2*  7.5* 7.3* 7.5*  HCT 24.7*   < > 17.7* 21.5* 22.6* 21.8* 22.7*  MCV 96.5  --   --  88.5 89.3 88.3 84.4  PLT 283  --   --  220 250 226 252   < > = values in this interval not displayed.    Basic Metabolic Panel: Recent Labs  Lab 04/29/20 0500 05/01/20 0420 05/03/20 0845 05/04/20 0600  NA 137 135 134* 133*  K 3.7 4.0 4.6 4.4  CL 102 102 102 97*  CO2 25 27 24 27   GLUCOSE 121* 102* 269* 200*  BUN 16 15 25* 24*  CREATININE 1.28* 1.25* 1.44* 1.38*  CALCIUM 8.7* 8.7* 8.3* 8.1*  MG  --   --  1.9  --     GFR: Estimated Creatinine Clearance: 59.5 mL/min (A) (by C-G formula based on SCr of 1.38 mg/dL (H)).  Liver Function Tests: Recent Labs  Lab 05/01/20 0420  AST 44*  ALT 58*  ALKPHOS 126  BILITOT 0.8  PROT 5.7*  ALBUMIN 2.0*   No results for input(s): LIPASE, AMYLASE in the  last 168 hours. No results for input(s): AMMONIA in the last 168 hours.  Coagulation Profile: No results for input(s): INR, PROTIME in the last 168 hours.  Cardiac Enzymes: No results for input(s): CKTOTAL, CKMB, CKMBINDEX, TROPONINI in the last 168 hours.  BNP (last 3 results) No results for input(s): PROBNP in the last 8760 hours.  Lipid Profile: No results for input(s): CHOL, HDL, LDLCALC, TRIG, CHOLHDL, LDLDIRECT in the last 72 hours.  Thyroid Function Tests: No results for input(s): TSH, T4TOTAL, FREET4, T3FREE, THYROIDAB in the last 72 hours.  Anemia Panel: No results for input(s): VITAMINB12, FOLATE, FERRITIN, TIBC, IRON, RETICCTPCT in the last 72 hours.  Urine analysis:    Component Value Date/Time   COLORURINE YELLOW 04/11/2020 2245   APPEARANCEUR CLEAR 04/11/2020 2245   LABSPEC 1.009 04/11/2020 2245   PHURINE 7.0 04/11/2020 2245   GLUCOSEU NEGATIVE 04/11/2020 2245   HGBUR NEGATIVE 04/11/2020 2245   BILIRUBINUR NEGATIVE 04/11/2020 2245   KETONESUR NEGATIVE 04/11/2020 2245   PROTEINUR NEGATIVE 04/11/2020 2245   NITRITE NEGATIVE 04/11/2020 2245   LEUKOCYTESUR NEGATIVE  04/11/2020 2245    Sepsis Labs: Lactic Acid, Venous    Component Value Date/Time   LATICACIDVEN 1.0 03/20/2020 1517    MICROBIOLOGY: Recent Results (from the past 240 hour(s))  Body fluid culture     Status: None   Collection Time: 04/30/20  5:28 PM   Specimen: Synovium; Body Fluid  Result Value Ref Range Status   Specimen Description SYNOVIAL LEFT KNEE  Final   Special Requests NONE  Final   Gram Stain   Final    ABUNDANT WBC PRESENT, PREDOMINANTLY PMN NO ORGANISMS SEEN    Culture   Final    NO GROWTH Performed at [facility], [address], [place], [place] [address]    Report Status 05/04/2020 FINAL  Final  Aerobic/Anaerobic Culture (surgical/deep wound)     Status: None (Preliminary result)   Collection Time: 05/02/20  8:55 AM   Specimen: Soft Tissue, Other  Result Value Ref Range Status   Specimen Description TISSUE  Final   Special Requests LEFT KNEE TISSUE SPEC A  Final   Gram Stain   Final    ABUNDANT WBC PRESENT, PREDOMINANTLY PMN NO ORGANISMS SEEN    Culture   Final    NO GROWTH 2 DAYS NO ANAEROBES ISOLATED; CULTURE IN PROGRESS FOR 5 DAYS Performed at [facility], [place] [address] [place], [place] [address]    Report Status PENDING  Incomplete  Aerobic/Anaerobic Culture (surgical/deep wound)     Status: None (Preliminary result)   Collection Time: 05/02/20  8:58 AM   Specimen: Soft Tissue, Other  Result Value Ref Range Status   Specimen Description TISSUE  Final   Special Requests RIGHT KNEE TISSUE SPEC B  Final   Gram Stain   Final    RARE WBC PRESENT,BOTH PMN AND MONONUCLEAR NO ORGANISMS SEEN    Culture   Final    NO GROWTH 2 DAYS NO ANAEROBES ISOLATED; CULTURE IN PROGRESS FOR 5 DAYS Performed at [facility], [place] [address] [place], [place] [address]    Report Status PENDING  Incomplete    RADIOLOGY STUDIES/RESULTS: No results found.   LOS: 2 days   Oren Binet, MD  [facility]    To contact the  attending provider between 7A-7P or the covering provider during after hours 7P-7A, please log into the web site [email or web address] and access using universal  password for that  web site. If you do not have the password, please call the hospital operator.  05/04/2020, 2:35 PM

## 2020-05-04 NOTE — Progress Notes (Addendum)
Progress Note  Patient Name: Johnathan Murray Date of Encounter: 05/04/2020  [facility] Cardiologist: Jenean Lindau, MD   Subjective   Denies any CP or SOB. Able to sit up today with physical therapy  Inpatient Medications    Scheduled Meds: . sodium chloride   Intravenous Once  . amiodarone  400 mg Oral BID  . Chlorhexidine Gluconate Cloth  6 each Topical Daily  . docusate sodium  100 mg Oral BID  . feeding supplement (ENSURE ENLIVE)  237 mL Oral TID BM  . ferrous [id number] C-folic acid  1 capsule Oral Q breakfast  . insulin aspart  0-20 Units Subcutaneous TID WC  . insulin aspart  0-5 Units Subcutaneous QHS  . insulin glargine  10 Units Subcutaneous BID  . levothyroxine  25 mcg Oral QAC breakfast  . metoprolol tartrate  12.5 mg Oral BID  . oxyCODONE  10 mg Oral Q12H  . pregabalin  100 mg Oral TID  . senna  1 tablet Oral QHS  . sodium chloride flush  10-40 mL Intracatheter Q12H  . sodium chloride flush  3 mL Intravenous Q12H   Continuous Infusions: . ampicillin (OMNIPEN) IV 2 g (05/04/20 1010)  . cefTRIAXone (ROCEPHIN) IVPB 2 gram/100 mL NS (Mini-Bag Plus) 2 g (05/04/20 1700)   PRN Meds: acetaminophen **OR** acetaminophen, albuterol, cyclobenzaprine, oxyCODONE, sodium chloride flush   Vital Signs    Vitals:   05/04/20 0407 05/04/20 0410 05/04/20 0441 05/04/20 0758  BP: 100/69 100/69  109/64  Pulse: 92 92  98  Resp: 15 15  14   Temp: 98.3 F (36.8 C) 98.3 F (36.8 C)  99 F (37.2 C)  TempSrc:  Axillary  Oral  SpO2:  97%  99%  Weight:   93.1 kg     Intake/Output Summary (Last 24 hours) at 05/04/2020 1117 Last data filed at 05/04/2020 1010 Gross per 24 hour  Intake 1159 ml  Output 1700 ml  Net -541 ml   Last 3 Weights 05/04/2020 05/03/2020 05/02/2020  Weight (lbs) 205 lb 4 oz 195 lb 8.8 oz 173 lb 15.1 oz  Weight (kg) 93.1 kg 88.7 kg 78.9 kg      Telemetry    Atrial fibrillation, HR 100-110s, rarely peak into 130s - Personally  Reviewed  ECG    Atrial fibrillation - Personally Reviewed  Physical Exam   GEN: No acute distress.   Neck: No JVD Cardiac: irregularly irregular, no murmurs, rubs, or gallops.  Respiratory: Clear to auscultation bilaterally. GI: Soft, nontender, non-distended  MS: No edema; No deformity. Neuro:  Nonfocal  Psych: Normal affect   Labs    High Sensitivity Troponin:  No results for input(s): TROPONINIHS in the last 720 hours.    Chemistry Recent Labs  Lab 05/01/20 0420 05/03/20 0845 05/04/20 0600  NA 135 134* 133*  K 4.0 4.6 4.4  CL 102 102 97*  CO2 27 24 27   GLUCOSE 102* 269* 200*  BUN 15 25* 24*  CREATININE 1.25* 1.44* 1.38*  CALCIUM 8.7* 8.3* 8.1*  PROT 5.7*  --   --   ALBUMIN 2.0*  --   --   AST 44*  --   --   ALT 58*  --   --   ALKPHOS 126  --   --   BILITOT 0.8  --   --   GFRNONAA 59* 50* 53*  GFRAA >60 58* >60  ANIONGAP 6 8 9      Hematology Recent Labs  Lab 05/03/20 0845  05/03/20 2125 05/04/20 0600  WBC 9.6 10.2 10.8*  RBC 2.53* 2.47* 2.69*  HGB 7.5* 7.3* 7.5*  HCT 22.6* 21.8* 22.7*  MCV 89.3 88.3 84.4  MCH 29.6 29.6 27.9  MCHC 33.2 33.5 33.0  RDW 14.6 14.6 19.6*  PLT 250 226 252    BNPNo results for input(s): BNP, PROBNP in the last 168 hours.   DDimer No results for input(s): DDIMER in the last 168 hours.   Radiology    No results found.  Cardiac Studies   Procedure: 04/16/2020 by Dr. Cyndia Bent  1. Redo median Sternotomy 2. Right axillary artery graft for cardiopulmonary bypass 3. Extracorporeal circulation 4. Debridement of aortic root abscess and pseudoaneurysm 5.   Aortic root replacement using a 23 mm Homograft aortic root with reimplantation of coronary arteries.  Patient Profile     69 y.o. male with PMH of AVR and CABG x 3 in 07/2016 at [facility], PAF, HTN, DM II and HLD who had cervical spine surgery in Apr 2021 and developed AKI and enterococcal bacteremia and septic arthritis of the knees. MRI reviewed discitis and  osteomyelitis of the cervical and lumbar spine, underwent disc aspiration by IR on 5/18. TEE showed endocarditis with pseudoaneurysm. Patient eventually underwent ebridement of aortic root abscess and pseudoaneurysm and aortic root replacement using a 23 mm homograft aortic root with reimplantation of coronary arteries by Dr. Cyndia Bent on 04/16/2020.   Assessment & Plan    1. PAF on Eliquis  - occurred in the setting of bacteremia, surgery and anemia  - s/p TEE DCCV 5/24 and repeat TEE/DCCV 6/9  - Eliquis on hold given surgery and anemia. Continue metoprolol tartrate 12.5mg  BID and amiodarone 400mg  BID. May consider cardioversion in a few month once he is recovered and able to take anticoagulation again.   - current HR 100-110s, likely physiological response to recent stress.   2. CAD s/p CABG in 2017  - myoview in Feb 2021 prior to spine surgery was abnormal. Cath 01/21/2020 was unchanged when compare to previous cath in 2019, 2/3 grafts occluded.   3. Aortic stenosis s/p AVR 2017 with subsequent aortic root replacement 04/16/2020 due to SBE  - endocarditis and abscess seen on TEE 5/24  - underwent debridement of aortic root abscess and pseudoaneurysm and aortic root replacement using a 23 mm homograft aortic root with reimplantation of coronary arteries  4. HTN  5. Anemia: s/p blood transfusion  6. Bilateral septic knee        For questions or updates, please contact [place] Please consult [email or web address] for contact info under        Signed, Almyra Deforest, [place]  05/04/2020, 11:17 AM     Personally seen and examined. Agree with above.   In bed, comfortable. Spoke to family Irreg irreg tachy, alert  PAF  - Eliquis on hold - surgery and anemia  - AMIO 400 BID, Metop 12.5 BID  - HR 100-120  - Continue with PT  CAD  - stable. 2/3 grafts down  AS post AVR with aortic root replacement 04/16/20 (abscess)  Candee Furbish, MD

## 2020-05-04 NOTE — Progress Notes (Signed)
Inpatient Rehabilitation Care Coordinator  Discharge Note  The overall goal for the admission was met for:   Discharge location: Yes. Pt d/c to acute hospital.  Length of Stay: Yes. 2 days  Discharge activity level: Yes. Min A   Home/community participation: Yes  Services provided included: MD, RD, PT, OT, RN, CM, TR, Pharmacy, Neuropsych and SW  Financial Services: Private Insurance: [employer]  Follow-up services arranged: N/A  Comments (or additional information):  Patient/Family verbalized understanding of follow-up arrangements: Yes  Individual responsible for coordination of the follow-up plan: Pt to have assistance with coordinating care needs.   Confirmed correct DME delivered: Rana Snare 05/04/2020    Rana Snare

## 2020-05-04 NOTE — Progress Notes (Signed)
[facility] consult to draw a CBC. Physician reported to unit RN there were no results for CBC from this am. Lab results from 0600, 05/04/20 are available: HGB = 7.5. Pt has had HGB between 7.2 and 7.5 since 6/13 @ midnight. VAST RN on way to pt's bedside to draw a repeat CBC.  Educated unit RN that each time a lab is ordered by physician and the patient has a midline or central line, a new IVT consult has to be placed by unit RN to notify [facility] to draw lab. Unfortunately, the lab computer program and the [facility] computer program do not communicate and there is no way for [facility] to be aware of labs ordered without a new IVT consult.

## 2020-05-04 NOTE — Progress Notes (Signed)
Notified by lab Cyril Mourning) patient hgb is 6.8.Marland KitchenMarland Kitchen Notified provider Dr. Darnell Level.  Also notified provider that patient's MEWS was yellow and now is green. Patient in no acute distress.

## 2020-05-04 NOTE — Evaluation (Signed)
Occupational Therapy Evaluation Patient Details Name: Johnathan Murray MRN: [id number] DOB: Apr 02, 1951 Today's Date: 05/04/2020    History of Present Illness Patient is a 69 y.o. 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  Patient was seen by cardiothoracic surgery-underwent debridement of aortic root abscess/aortic valve replacement with bioprosthetic valve-ID was consulted-patient was subsequently discharged to CIR on 6/10.  While at CIR-patient started having gradually worsening anemia, bilateral knee pain/swelling-arthrocentesis was suggestive of septic knee-patient subsequently underwent irrigation/debridement and total synovectomy of bilateral knees-post procedure-patient was noted to have A. fib with RVR and severe anemia-and readmitted to the hospitalist service.   Clinical Impression   At his baseline, pt functions independently. Presents with B knee pain and inability to tolerate weight on LEs. Assisted pt to sit EOB x 5 minutes with +2 assist. Pt props on B UEs with sitting due to pain. Pt requires set up to total assist for ADL. Recommend return to CIR to complete rehab prior to return home with his supportive wife.    Follow Up Recommendations  CIR;Supervision/Assistance - 24 hour    Equipment Recommendations  Wheelchair (measurements OT);Wheelchair cushion (measurements OT)    Recommendations for Other Services       Precautions / Restrictions Precautions Precautions: Sternal;Fall Precaution Comments: sternal Spinal Brace:  Lumbar corset Other Brace: for comfort, pt reports "it doesn't really help me" so we didn't use back brace Restrictions Weight Bearing Restrictions: Yes      Mobility Bed Mobility Overal bed mobility: Needs Assistance Bed Mobility: Supine to Sit;Sit to Supine     Supine to sit: +2 for physical assistance;Mod assist Sit to supine: +2 for physical assistance;Max assist   General bed mobility comments: assisted LEs over EOB with bed pad, mod assist to raise trunk, guided trunk while LEs returned to bed  Transfers                 General transfer comment: pt unable to tolerate weight on LEs    Balance Overall balance assessment: Needs assistance Sitting-balance support: Bilateral upper extremity supported;Feet supported Sitting balance-Leahy Scale: Fair Sitting balance - Comments: x 5 minutes                                   ADL either performed or assessed with clinical judgement   ADL Overall ADL's : Needs assistance/impaired Eating/Feeding: Set up;Bed level   Grooming: Set up;Bed level   Upper Body Bathing: Moderate assistance;Bed level   Lower Body Bathing: Total assistance;Bed level   Upper Body Dressing : Minimal assistance;Bed level   Lower Body Dressing: Total assistance;Bed level       Toileting- Clothing Manipulation and Hygiene: Total assistance;Bed level         General ADL Comments: pt sat EOB x 5 minutes      Vision Baseline Vision/History: Wears glasses Patient Visual Report: No change from baseline       Perception     Praxis      Pertinent Vitals/Pain Pain Assessment: Faces Faces Pain Scale: Hurts even more Pain Location: B knees  Pain Descriptors / Indicators: Grimacing;Guarding Pain Intervention(s): Monitored during session;Premedicated before session;Repositioned     Hand Dominance Right   Extremity/Trunk Assessment         Cervical / Trunk Assessment Cervical / Trunk Exceptions: s/p ACDF on 4/12; low  back pain   Communication Communication Communication: No difficulties   Cognition Arousal/Alertness: Awake/alert Behavior During Therapy: WFL for tasks assessed/performed Overall Cognitive Status: Within Functional Limits for tasks assessed                                     General Comments       Exercises     Shoulder Instructions      Home Living Family/patient expects to be discharged to:: Private residence Living Arrangements: Spouse/significant other Available Help at Discharge: Family;Available 24 hours/day Type of Home: House Home Access: Stairs to enter [employer] of Steps: 8 Entrance Stairs-Rails: Right;Left Home Layout: One level     Bathroom Shower/Tub: Occupational psychologist: Handicapped height   How Accessible: Accessible via walker Home Equipment: Grab bars - tub/shower;Shower seat - built in;Cane - single point;Walker - 2 wheels      Lives With: Spouse    Prior Functioning/Environment Level of Independence: Independent  Gait / Transfers Assistance Needed: Prior to admission on 4/28 pt amb with RW or cane     Comments: independent in ADL and IADL prior to recent admission, worked as a Tax inspector prior to [employer]        OT Problem List: Decreased strength;Decreased activity tolerance;Impaired balance (sitting and/or standing);Decreased safety awareness;Decreased cognition;Decreased knowledge of use of DME or AE;Cardiopulmonary status limiting activity;Obesity;Pain      OT Treatment/Interventions: Self-care/ADL training;Energy conservation;DME and/or AE instruction;Therapeutic activities;Patient/family education;Balance training    OT Goals(Current goals can be found in the care plan section) Acute Rehab OT Goals Patient Stated Goal: to get stronger and go back to work OT Goal Formulation: With patient Time For Goal Achievement: 05/18/20 Potential to Achieve Goals: Good ADL Goals Pt Will  Perform Grooming: with supervision;sitting Pt Will Perform Upper Body Bathing: with supervision;sitting Pt Will Perform Upper Body Dressing: with supervision;sitting Pt Will Transfer to Toilet: with mod assist;stand pivot transfer;bedside commode Additional ADL Goal #1: Pt will perform bed mobility with min assist adhering to sternal precautions. Additional ADL Goal #2: Pt will tolerate sitting EOB unsupported by UEs x 10 minutes while engaged in ADL.  OT Frequency: Min 2X/week   Barriers to D/C:            Co-evaluation              AM-PAC OT "6 Clicks" Daily Activity     Outcome Measure Help from another person eating meals?: None Help from another person taking care of personal grooming?: A Little Help from another person toileting, which includes using toliet, bedpan, or urinal?: Total Help from another person bathing (including washing, rinsing, drying)?: A Lot Help from another person to put on and taking off regular upper body clothing?: A Little Help from another person to put on and taking off regular lower body clothing?: Total 6 Click Score: 14   End of Session    Activity Tolerance: Patient limited by pain Patient left: in bed;with call bell/phone within reach;with family/visitor present (cardiology PA in room)  OT Visit Diagnosis: Muscle weakness (generalized) (M62.81);Other symptoms and signs involving cognitive function;Pain  Time: 1043-1110 OT Time Calculation (min): 27 min Charges:  OT General Charges $OT Visit: 1 Visit OT Evaluation $OT Eval Moderate Complexity: 1 Mod OT Treatments $Therapeutic Activity: 8-22 mins  Nestor Lewandowsky, OTR/L Acute Rehabilitation Services Pager: [phone] Office: [phone]  Malka So 05/04/2020, 12:01 PM

## 2020-05-04 NOTE — Progress Notes (Signed)
   05/04/20 1700  Assess: MEWS Score  Temp 98.8 F (37.1 C)  BP (!) 100/53  Pulse Rate 84  Resp 16  SpO2 100 %  O2 Device Room Air  Assess: MEWS Score  MEWS Temp 0  MEWS Systolic 1  MEWS Pulse 0  MEWS RR 0  MEWS LOC 0  MEWS Score 1  MEWS Score Color Green  Assess: if the MEWS score is Yellow or Red  Were vital signs taken at a resting state? Yes  Focused Assessment Documented focused assessment  Early Detection of Sepsis Score *See Row Information* Low  Treat  MEWS Interventions Other (Comment)  Notify: Charge Nurse/RN  Name of Charge Nurse/RN Notified Erin, RN  Date Charge Nurse/RN Notified 05/04/20  Time Charge Nurse/RN Notified 1700  Notify: Provider  Provider Name/Title Dr. Sloan Leiter  Date Provider Notified 05/04/20  Time Provider Notified [id number]  Notification Type Page  Notification Reason Change in status  Response No new orders  Date of Provider Response 05/04/20  Time of Provider Response 1729  Document  Patient Outcome Stabilized after interventions

## 2020-05-04 NOTE — Progress Notes (Signed)
7:15 am Dr. Jess Barters Physician Assistant is at bedside with patient and removing wound vacs. Patient is alert and oriented and in no acute distress.

## 2020-05-04 NOTE — Progress Notes (Addendum)
   05/04/20 1613  Assess: MEWS Score  Temp 98.6 F (37 C)  BP 104/70  Pulse Rate (!) 112  Resp 12  SpO2 100 %  Assess: MEWS Score  MEWS Temp 0  MEWS Systolic 0  MEWS Pulse 2  MEWS RR 1  MEWS LOC 0  MEWS Score 3  MEWS Score Color Yellow  Assess: if the MEWS score is Yellow or Red  Were vital signs taken at a resting state? Yes  Focused Assessment Documented focused assessment  Early Detection of Sepsis Score *See Row Information* Low  Treat  MEWS Interventions Other (Comment)  Notify: Charge Nurse/RN  Name of Charge Nurse/RN Notified Erin, RN   Date Charge Nurse/RN Notified 05/04/20  Time Charge Nurse/RN Notified 1615  Notify: Provider  Provider Name/Title Dr. Sloan Leiter  Date Provider Notified 05/04/20  Time Provider Notified 1615  Notification Type Page  Notification Reason Change in status  Response No new orders  Date of Provider Response 05/04/20  Time of Provider Response 1616  Document  Patient Outcome Stabilized after interventions

## 2020-05-04 NOTE — Progress Notes (Signed)
Patient sitting up right in bed with wife in room also.  Patient is having 1 unit PRBC infusing and in no acute distress.  Will report to next shift.

## 2020-05-04 NOTE — Progress Notes (Signed)
2140 Contacted Dr. Sharol Given d/t left knee wound vac incision leaking blood. Wound vac on, continous @ 859 no complications with seal, charger plugged into wall. 162mL output in wound vac canister. Advised to keep reinforcing the dressing and MD or PA will assess in the morning. Dressing reinforced per MD.  Repeat CBC ordered at 2300 by MD Ghimire. Hgb resulted in 7.2 notified on call MD and 1 unit of blood was ordered.  Dressing to left leg reinforced.  0407 1 unit of blood completed (363mL) CBC to be collected at 0600.  Will continue to monitor

## 2020-05-05 DIAGNOSIS — M009 Pyogenic arthritis, unspecified: Secondary | ICD-10-CM

## 2020-05-05 DIAGNOSIS — I38 Endocarditis, valve unspecified: Secondary | ICD-10-CM

## 2020-05-05 DIAGNOSIS — M4646 Discitis, unspecified, lumbar region: Secondary | ICD-10-CM

## 2020-05-05 DIAGNOSIS — T826XXD Infection and inflammatory reaction due to cardiac valve prosthesis, subsequent encounter: Secondary | ICD-10-CM

## 2020-05-05 LAB — COMPREHENSIVE METABOLIC PANEL
ALT: 41 U/L (ref 0–44)
AST: 25 U/L (ref 15–41)
Albumin: 1.8 g/dL — ABNORMAL LOW (ref 3.5–5.0)
Alkaline Phosphatase: 75 U/L (ref 38–126)
Anion gap: 10 (ref 5–15)
BUN: 20 mg/dL (ref 8–23)
CO2: 26 mmol/L (ref 22–32)
Calcium: 8.3 mg/dL — ABNORMAL LOW (ref 8.9–10.3)
Chloride: 100 mmol/L (ref 98–111)
Creatinine, Ser: 1.23 mg/dL (ref 0.61–1.24)
GFR calc Af Amer: >60 mL/min (ref >60)
GFR calc non Af Amer: >60 mL/min (ref >60)
Glucose, Bld: 172 mg/dL — ABNORMAL HIGH (ref 70–99)
Potassium: 4.1 mmol/L (ref 3.5–5.1)
Sodium: 136 mmol/L (ref 135–145)
Total Bilirubin: 0.5 mg/dL (ref 0.3–1.2)
Total Protein: 4.7 g/dL — ABNORMAL LOW (ref 6.5–8.1)

## 2020-05-05 LAB — CBC
HCT: 24.5 % — ABNORMAL LOW (ref 39.0–52.0)
Hemoglobin: 8.1 g/dL — ABNORMAL LOW (ref 13.0–17.0)
MCH: 28.9 pg (ref 26.0–34.0)
MCHC: 33.1 g/dL (ref 30.0–36.0)
MCV: 87.5 fL (ref 80.0–100.0)
Platelets: 248 10*3/uL (ref 150–400)
RBC: 2.8 MIL/uL — ABNORMAL LOW (ref 4.22–5.81)
RDW: 18.4 % — ABNORMAL HIGH (ref 11.5–15.5)
WBC: 9.4 10*3/uL (ref 4.0–10.5)
nRBC: 0 % (ref 0.0–0.2)

## 2020-05-05 LAB — BPAM RBC
Blood Product Expiration Date: 202106192359
Blood Product Expiration Date: 202106202359
Blood Product Expiration Date: 202107022359
Blood Product Expiration Date: 202107022359
Blood Product Expiration Date: 202107022359
Blood Product Expiration Date: 202107072359
ISSUE DATE / TIME: 202106121809
ISSUE DATE / TIME: 202106130051
ISSUE DATE / TIME: 202106130408
ISSUE DATE / TIME: 202106131504
ISSUE DATE / TIME: 202106140118
ISSUE DATE / TIME: 202106141747
Unit Type and Rh: 6200
Unit Type and Rh: 6200
Unit Type and Rh: 6200
Unit Type and Rh: 6200
Unit Type and Rh: 6200
Unit Type and Rh: 6200

## 2020-05-05 LAB — TYPE AND SCREEN
ABO/RH(D): A POS
Antibody Screen: NEGATIVE
Unit division: 0
Unit division: 0
Unit division: 0
Unit division: 0
Unit division: 0
Unit division: 0

## 2020-05-05 LAB — GLUCOSE, CAPILLARY: Glucose-Capillary: 208 mg/dL — ABNORMAL HIGH (ref 70–99)

## 2020-05-05 LAB — SEDIMENTATION RATE: Sed Rate: 30 mm/hr — ABNORMAL HIGH (ref 0–16)

## 2020-05-05 LAB — C-REACTIVE PROTEIN: CRP: 11.4 mg/dL — ABNORMAL HIGH (ref <1.0)

## 2020-05-05 NOTE — Progress Notes (Addendum)
PROGRESS NOTE        PATIENT DETAILS Name: Johnathan Murray Age: 69 y.o. Sex: male Date of Birth: 08-Dec-1950 Admit Date: 05/02/2020 Admitting Physician Norval Morton, MD XNA:[id number], [name], MD  Brief Narrative: Patient is a 69 y.o. 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  Patient was seen by cardiothoracic surgery-underwent debridement of aortic root abscess/aortic valve replacement with bioprosthetic valve-ID was consulted-patient was subsequently discharged to CIR on 6/10.  While at CIR-patient started having gradually worsening anemia, bilateral knee pain/swelling-arthrocentesis was suggestive of septic knee-patient subsequently underwent irrigation/debridement and total synovectomy of bilateral knees-postoperatively-patient was noted to have A. fib with RVR and severe anemia-and readmitted to the hospitalist service.  See below for further details.  Significant events: 4/29-5/18>> admit by [facility] for sepsis secondary to Enterococcus faecalis, found to have acute osteo/discitis of L/-/5, left knee septic arthritis-discharge to CIR  5/18-5/24>> at CIR-aortic root abscess/prosthetic aortic valve vegetation on TEE (done for cardioversion) 5/24-6/10>> admit by [facility] from CIR-for aortic root replacement-discharged back to CIR. 6/10-6/12>> at CIR- recurrent bilateral knee septic arthritis on arthrocentesis-ortho reconsulted 6/12-current>> admit to [facility] for A. fib with RVR, severe anemia following bilateral knee washout for  septic arthritis.  Significant postoperative bleeding from left knee.  Significant studies: 4/29>> CT abdomen/pelvis: No acute intra-abdominal or pelvic pathology 4/29>> CT C-spine: Interval removal of fixating plate/screws from C4-C6, no acute osseous abnormality 4/30>>left lower extremity Doppler: No DVT, ruptured Baker's cyst 5/3>> MRI C-spine: No evidence of infection, s/p C3-4 discectomy/fusion. 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  Procedures : 6/12>> irrigation/debridement with total synovectomy of bilateral knees 6/9>> TEE guided cardioversion 5/27>>S/p Redo median sternotomy, rightaxillaryartery graftfor cardiopulmonary bypass, extracorporeal circulation, debridement of aortic root abscess and pseudoaneurysm, and aortic root replacement using a 77mm Homograft aortic root with reimplantation of coronary arteries. 5/24>> TEE guided cardioversion.  Found to have vegetation on prosthetic aortic valve on TEE. 5/18>> fluoroscopy guided needle aspiration of L4-L5 disc space 5/4>> B/L knee irrigation irrigation debridement arthroscopic. B/L knee medial and lateral menisectomies.  Antimicrobial therapy: Ampicillin: 5/18>> Rocephin: 5/19>>  Microbiology data: 6/12>> knee culture: No growth 6/10>> left knee synovial fluid culture: No growth  5/27>> aortic valve tissue culture: No  growth 5/26>> blood culture: No growth 5/22>> urine culture: No growth 5/19>> left synovitic fluid culture: No growth 5/18>> L4-L5 disc aspirate: Enterococcus faecalis 5/6>> blood culture: No growth 5/3>> blood culture: No growth 5/2>> right knee synovial fluid: No growth 5/1>> left knee synovial fluid: Enterococcus faecalis 4/30>> blood culture: Enterococcus faecalis 4/29>> urine  culture: No growth  Consults: ID CTVS Cardiology Ortho  DVT Prophylaxis : SCD's  Subjective: No major events overnight-left knee-1 small area soaked with dried blood-denies any chest pain or shortness of breath.  Assessment/Plan: Hemorrhagic shock with acute blood loss anemia superimposed on anemia of chronic disease: Shock physiology has resolved-transfused a total of 5 units of PRBC this admission.  Left knee bleeding seems to have essentially resolved this morning.  Will touch base with orthopedics-to see if Eliquis can be resumed in a day or so.  Hemoglobin stable this morning.  A. fib with RVR: Rate controlled but remains in atrial fibrillation-continue amiodarone and low-dose beta-blocker.  Cardiology following.  Eliquis on hold due to severity of anemia and bleeding from left knee.    Enterococcus bacteremia-with L4-L5 discitis, bilateral septic knee, prosthetic valve vegetation with aortic root abscess: Remains on IV ampicillin/Rocephin-ID following-intraoperative synovial fluid cultures on 6/12 neg so far.  Postop care regarding bilateral knees defer to orthopedics.  CAD-s/p CABG-no anginal symptoms-antiplatelets/anticoagulation on hold.  Hypothyroidism: Continue levothyroxine  DM-2 with uncontrolled hyperglycemia: CBGs relatively stable-continue Lantus 10 units twice daily, SSI.  Follow and adjust.     Recent Labs    05/04/20 0800 05/04/20 1223 05/04/20 2104  GLUCAP 188* 220* 166*   Debility/deconditioning: Secondary to acute illness-we will need PT/OT-possibly back to CIR at some  point.   Diet: Diet Order            Diet Carb Modified Fluid consistency: Thin; Room service appropriate? Yes  Diet effective now                Code Status: Full code   Family Communication: Spouse at bedside  Disposition Plan: Status is: Inpatient  Remains inpatient appropriate because:Inpatient level of care appropriate due to severity of illness  Dispo: The patient is from: Home              Anticipated d/c is to: CIR              Anticipated d/c date is: > 3 days              Patient currently is not medically stable to d/c.   Barriers to Discharge: Hemorrhagic shock with acute blood loss anemia-bilateral septic arthritis of knees-on IV antibiotics-remains A. fib with RVR.  Antimicrobial agents: Anti-infectives (From admission, onward)   Start     Dose/Rate Route Frequency Ordered Stop   05/03/20 0500  cefTRIAXone (ROCEPHIN) 2 g in sodium chloride 0.9 % 100 mL IVPB     Discontinue     2 g 200 mL/hr over 30 Minutes Intravenous Every 12 hours 05/02/20 2155     05/02/20 2245  cefTRIAXone (ROCEPHIN) 2 g in sodium chloride 0.9 % 100 mL IVPB  Status:  Discontinued        2 g 200 mL/hr over 30 Minutes Intravenous Every 12 hours 05/02/20 1943 05/02/20 2155   05/02/20 1800  ampicillin (OMNIPEN) 2 g in sodium chloride 0.9 % 100 mL IVPB     Discontinue     2 g 300 mL/hr over 20 Minutes Intravenous Every 4 hours 05/02/20 1745         Time spent: 25 minutes-Greater than 50% of this time was spent in counseling, explanation of diagnosis, planning of further management, and coordination of care.  MEDICATIONS: Scheduled Meds: . sodium chloride   Intravenous Once  . amiodarone  400 mg Oral BID  . Chlorhexidine Gluconate Cloth  6 each Topical Daily  .  feeding supplement (ENSURE ENLIVE)  237 mL Oral TID BM  . ferrous [id number] C-folic acid  1 capsule Oral Q breakfast  . insulin aspart  0-20 Units Subcutaneous TID WC  . insulin aspart  0-5 Units Subcutaneous QHS    . insulin glargine  10 Units Subcutaneous BID  . levothyroxine  25 mcg Oral QAC breakfast  . metoprolol tartrate  12.5 mg Oral BID  . oxyCODONE  10 mg Oral Q12H  . polyethylene glycol  17 g Oral Daily  . pregabalin  100 mg Oral TID  . senna  1 tablet Oral QHS  . sodium chloride flush  10-40 mL Intracatheter Q12H  . sodium chloride flush  3 mL Intravenous Q12H   Continuous Infusions: . ampicillin (OMNIPEN) IV 2 g (05/05/20 1344)  . cefTRIAXone (ROCEPHIN) IVPB 2 gram/100 mL NS (Mini-Bag Plus) Stopped (05/05/20 1247)   PRN Meds:.acetaminophen **OR** acetaminophen, albuterol, cyclobenzaprine, oxyCODONE, sodium chloride flush   PHYSICAL EXAM: Vital signs: Vitals:   05/05/20 0000 05/05/20 0400 05/05/20 0837 05/05/20 1203  BP: 95/61 97/64 118/68 101/62  Pulse: 91 83 97 98  Resp: 16 16 (!) 21 14  Temp: 98 F (36.7 C) 98 F (36.7 C) 98.7 F (37.1 C) 97.8 F (36.6 C)  TempSrc: Oral Axillary Oral Oral  SpO2: 100% 97% 99% 97%  Weight:       Filed Weights   05/03/20 0405 05/04/20 0441  Weight: 88.7 kg 93.1 kg   Body mass index is 29.45 kg/m.   Gen Exam:Alert awake-not in any distress HEENT:atraumatic, normocephalic Chest: B/L clear to auscultation anteriorly CVS:S1S2 regular Abdomen:soft non tender, non distended Extremities:trace edema Neurology: Non focal Skin: no rash  I have personally reviewed following labs and imaging studies  LABORATORY DATA: CBC: Recent Labs  Lab 05/01/20 0420 05/02/20 2252 05/03/20 0845 05/03/20 2125 05/04/20 0600 05/04/20 1714 05/05/20 0403  WBC 6.8   < > 9.6 10.2 10.8* 10.2 9.4  NEUTROABS 4.8  --   --   --   --   --   --   HGB 7.4*   < > 7.5* 7.3* 7.5* 6.8* 8.1*  HCT 24.7*   < > 22.6* 21.8* 22.7* 20.5* 24.5*  MCV 96.5   < > 89.3 88.3 84.4 85.4 87.5  PLT 283   < > 250 226 252 231 248   < > = values in this interval not displayed.    Basic Metabolic Panel: Recent Labs  Lab 04/29/20 0500 05/01/20 0420 05/03/20 0845  05/04/20 0600 05/05/20 0403  NA 137 135 134* 133* 136  K 3.7 4.0 4.6 4.4 4.1  CL 102 102 102 97* 100  CO2 25 27 24 27 26   GLUCOSE 121* 102* 269* 200* 172*  BUN 16 15 25* 24* 20  CREATININE 1.28* 1.25* 1.44* 1.38* 1.23  CALCIUM 8.7* 8.7* 8.3* 8.1* 8.3*  MG  --   --  1.9  --   --     GFR: Estimated Creatinine Clearance: 66.8 mL/min (by C-G formula based on SCr of 1.23 mg/dL).  Liver Function Tests: Recent Labs  Lab 05/01/20 0420 05/05/20 0403  AST 44* 25  ALT 58* 41  ALKPHOS 126 75  BILITOT 0.8 0.5  PROT 5.7* 4.7*  ALBUMIN 2.0* 1.8*   No results for input(s): LIPASE, AMYLASE in the last 168 hours. No results for input(s): AMMONIA in the last 168 hours.  Coagulation Profile: No results for input(s): INR, PROTIME in the last 168 hours.  Cardiac Enzymes: No results  for input(s): CKTOTAL, CKMB, CKMBINDEX, TROPONINI in the last 168 hours.  BNP (last 3 results) No results for input(s): PROBNP in the last 8760 hours.  Lipid Profile: No results for input(s): CHOL, HDL, LDLCALC, TRIG, CHOLHDL, LDLDIRECT in the last 72 hours.  Thyroid Function Tests: No results for input(s): TSH, T4TOTAL, FREET4, T3FREE, THYROIDAB in the last 72 hours.  Anemia Panel: No results for input(s): VITAMINB12, FOLATE, FERRITIN, TIBC, IRON, RETICCTPCT in the last 72 hours.  Urine analysis:    Component Value Date/Time   COLORURINE YELLOW 04/11/2020 2245   APPEARANCEUR CLEAR 04/11/2020 2245   LABSPEC 1.009 04/11/2020 2245   PHURINE 7.0 04/11/2020 2245   GLUCOSEU NEGATIVE 04/11/2020 2245   HGBUR NEGATIVE 04/11/2020 2245   BILIRUBINUR NEGATIVE 04/11/2020 2245   KETONESUR NEGATIVE 04/11/2020 2245   PROTEINUR NEGATIVE 04/11/2020 2245   NITRITE NEGATIVE 04/11/2020 2245   LEUKOCYTESUR NEGATIVE 04/11/2020 2245    Sepsis Labs: Lactic Acid, Venous    Component Value Date/Time   LATICACIDVEN 1.0 03/20/2020 1517    MICROBIOLOGY: Recent Results (from the past 240 hour(s))  Body fluid culture      Status: None   Collection Time: 04/30/20  5:28 PM   Specimen: Synovium; Body Fluid  Result Value Ref Range Status   Specimen Description SYNOVIAL LEFT KNEE  Final   Special Requests NONE  Final   Gram Stain   Final    ABUNDANT WBC PRESENT, PREDOMINANTLY PMN NO ORGANISMS SEEN    Culture   Final    NO GROWTH Performed at [facility], [address] [place], [place] [address]    Report Status 05/04/2020 FINAL  Final  Aerobic/Anaerobic Culture (surgical/deep wound)     Status: None (Preliminary result)   Collection Time: 05/02/20  8:55 AM   Specimen: Soft Tissue, Other  Result Value Ref Range Status   Specimen Description TISSUE  Final   Special Requests LEFT KNEE TISSUE SPEC A  Final   Gram Stain   Final    ABUNDANT WBC PRESENT, PREDOMINANTLY PMN NO ORGANISMS SEEN    Culture   Final    NO GROWTH 3 DAYS NO ANAEROBES ISOLATED; CULTURE IN PROGRESS FOR 5 DAYS Performed at [facility], [place] [address] [place], [place] [address]    Report Status PENDING  Incomplete  Aerobic/Anaerobic Culture (surgical/deep wound)     Status: None (Preliminary result)   Collection Time: 05/02/20  8:58 AM   Specimen: Soft Tissue, Other  Result Value Ref Range Status   Specimen Description TISSUE  Final   Special Requests RIGHT KNEE TISSUE SPEC B  Final   Gram Stain   Final    RARE WBC PRESENT,BOTH PMN AND MONONUCLEAR NO ORGANISMS SEEN    Culture   Final    NO GROWTH 3 DAYS NO ANAEROBES ISOLATED; CULTURE IN PROGRESS FOR 5 DAYS Performed at [facility], [place] [address], [place], [place] [address]    Report Status PENDING  Incomplete    RADIOLOGY STUDIES/RESULTS: No results found.   LOS: 3 days   Oren Binet, MD  [facility]    To contact the attending provider between 7A-7P or the covering provider during after hours 7P-7A, please log into the web site [email or web address] and access using universal [place] password for that web site. If you do not have the  password, please call the hospital operator.  05/05/2020, 2:36 PM

## 2020-05-05 NOTE — Progress Notes (Signed)
  Subjective:  Says he feels better today. Was able to stand and has minimal pain in knees. Back pain about the same but seems to be improving some with time as long as standing or sitting straight up.  Objective: Vital signs in last 24 hours: Temp:  [97.8 F (36.6 C)-98.7 F (37.1 C)] 98 F (36.7 C) (06/15 1600) Pulse Rate:  [83-98] 90 (06/15 1600) Cardiac Rhythm: Atrial fibrillation (06/15 1037) Resp:  [12-21] 13 (06/15 1600) BP: (95-118)/(57-80) 105/70 (06/15 1600) SpO2:  [94 %-100 %] 99 % (06/15 1600)  Hemodynamic parameters for last 24 hours:    Intake/Output from previous day: 06/14 0701 - 06/15 0700 In: 1944 [P.O.:1004; I.V.:10; Blood:630; IV Piggyback:300] Out: 2850 [Urine:2850] Intake/Output this shift: Total I/O In: 794.9 [P.O.:240; IV Piggyback:554.9] Out: 500 [Urine:500]  General appearance: alert and cooperative Neurologic: intact Heart: regular rate and rhythm, S1, S2 normal, no murmur Lungs: clear to auscultation bilaterally Extremities: edema mild in both lower legs and feet Wound: chest incision well-healed, sternum stable.  Lab Results: Recent Labs    05/04/20 1714 05/05/20 0403  WBC 10.2 9.4  HGB 6.8* 8.1*  HCT 20.5* 24.5*  PLT 231 248   BMET:  Recent Labs    05/04/20 0600 05/05/20 0403  NA 133* 136  K 4.4 4.1  CL 97* 100  CO2 27 26  GLUCOSE 200* 172*  BUN 24* 20  CREATININE 1.38* 1.23  CALCIUM 8.1* 8.3*    PT/INR: No results for input(s): LABPROT, INR in the last 72 hours. ABG    Component Value Date/Time   PHART 7.409 04/16/2020 2343   HCO3 25.0 04/16/2020 2343   TCO2 26 04/16/2020 2343   O2SAT 96.0 04/16/2020 2343   CBG (last 3)  Recent Labs    05/04/20 1223 05/04/20 2104 05/05/20 1647  GLUCAP 220* 166* 208*    Assessment/Plan:  He is stable from a cardiac surgical standpoint and seems to be healing well. He is back in atrial fib with controlled rate on amiodarone. Eliquis stopped due to bleeding from knees. Would  restart anticoagulation at some point and could use heparin initially if continued concern about bleeding. Cardiology will decide about DCCV later if he remains in AF but as long as rate controlled there is no hurry.  Antibiotics for endocarditis per ID. We will follow progress.  LOS: 3 days    Gaye Pollack 05/05/2020

## 2020-05-05 NOTE — Progress Notes (Signed)
Patient ID: Johnathan Murray, male   DOB: 01/19/1951, 69 y.o.   MRN: [id number]         [facility]  Date of Admission:  05/02/2020   Total days of antibiotics 77           ASSESSMENT: Johnathan Murray is a 69 y.o. 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   Unfortunately he recently required repeat bilateral knee I&D with synovectomy with Dr. Sharol Given on 6/12 due to acute painful knees with recurrent massive effusion - WBC > 100,000. He has improved with ROM and pain following surgery and working on physical therapy. ?superimposed infection on pseudogout (+crystals) --> drainage was described to be purulent and pre-op MRI was concerning for bone changes c/w osteomyelitis.    PLAN: 1. Continue ampicillin and ceftriaxone  2. Await results of final cultures from b/l knee synovium     Principal Problem:   Bacteremia due to Enterococcus Active Problems:   Diabetes mellitus due to underlying condition with unspecified complications (HCC)   Dyslipidemia   PAF (paroxysmal atrial fibrillation) (HCC)   S/P AVR   Septic arthritis of knee, bilateral (HCC)   Prosthetic valve endocarditis (HCC)   Acute blood loss anemia   Prolonged QT interval   Transient hypotension   Lumbar discitis   Scheduled Meds: . sodium chloride   Intravenous Once  . amiodarone  400 mg Oral BID  . Chlorhexidine Gluconate Cloth  6 each Topical Daily  . feeding supplement (ENSURE ENLIVE)  237 mL Oral TID BM  . ferrous [id number] C-folic acid  1 capsule Oral Q breakfast  . insulin aspart  0-20 Units Subcutaneous TID WC  . insulin aspart  0-5 Units Subcutaneous QHS  . insulin glargine  10 Units Subcutaneous BID  . levothyroxine  25  mcg Oral QAC breakfast  . metoprolol tartrate  12.5 mg Oral BID  . oxyCODONE  10 mg Oral Q12H  . polyethylene glycol  17 g Oral Daily  . pregabalin  100 mg Oral TID  . senna  1 tablet Oral QHS  . sodium chloride flush  10-40 mL Intracatheter Q12H  . sodium chloride flush  3 mL Intravenous Q12H   Continuous Infusions: . ampicillin (OMNIPEN) IV 2 g (05/05/20 1056)  . cefTRIAXone (ROCEPHIN) IVPB 2 gram/100 mL NS (Mini-Bag Plus) 2 g (05/05/20 0430)   PRN Meds:.acetaminophen **OR** acetaminophen, albuterol, cyclobenzaprine, oxyCODONE, sodium chloride flush   SUBJECTIVE: He and his wife are in the room watching TV. Still having some palpitations - Cardiology following and in and out of AFib. Dr. Marlou Porch following with cards team.   Dr. Sharol Given has not been by to see him yet today. Micro from 6/12 knee wash out - no growth, pending from both samples. He was able to put full weight on knees yesterday with PT but had a hard time getting his right to move well. Pain is overall improved however since wash out completed.   CRP 11.4 (12.4 81mago) ESR 30 (118 163mgo)   Review of Systems: Review of Systems  Constitutional: Negative for chills, diaphoresis and fever.  Respiratory: Negative for cough and shortness of breath.   Cardiovascular: Positive for palpitations.  Gastrointestinal: Negative for abdominal pain, diarrhea, nausea and vomiting.  Musculoskeletal: Positive for joint pain.  All other systems  reviewed and are negative.   Allergies  Allergen Reactions  . Atorvastatin Other (See Comments)    Urinary retention   . Nebivolol Diarrhea    OBJECTIVE: Vitals:   05/04/20 2100 05/05/20 0000 05/05/20 0400 05/05/20 0837  BP: 1'14/80 95/61 97/64 '$ 118/68  Pulse:  91 83 97  Resp:  16 16 (!) 21  Temp: 98.1 F (36.7 C) 98 F (36.7 C) 98 F (36.7 C) 98.7 F (37.1 C)  TempSrc: Oral Oral Axillary Oral  SpO2:  100% 97% 99%  Weight:       Body mass index is 29.45 kg/m.  Physical  Exam Constitutional:      Comments: He is resting quietly in bed. Sleeping upon arrival. Awakens easily.   Cardiovascular:     Rate and Rhythm: Normal rate. Rhythm irregular.     Heart sounds: No murmur heard.   Pulmonary:     Effort: Pulmonary effort is normal.     Breath sounds: Normal breath sounds.  Musculoskeletal:     Comments: Clean and dry ACE wraps to both knees.   Psychiatric:        Mood and Affect: Mood normal.     Lab Results Lab Results  Component Value Date   WBC 9.4 05/05/2020   HGB 8.1 (L) 05/05/2020   HCT 24.5 (L) 05/05/2020   MCV 87.5 05/05/2020   PLT 248 05/05/2020    Lab Results  Component Value Date   CREATININE 1.23 05/05/2020   BUN 20 05/05/2020   NA 136 05/05/2020   K 4.1 05/05/2020   CL 100 05/05/2020   CO2 26 05/05/2020    Lab Results  Component Value Date   ALT 41 05/05/2020   AST 25 05/05/2020   ALKPHOS 75 05/05/2020   BILITOT 0.5 05/05/2020     Microbiology: Recent Results (from the past 240 hour(s))  Body fluid culture     Status: None   Collection Time: 04/30/20  5:28 PM   Specimen: Synovium; Body Fluid  Result Value Ref Range Status   Specimen Description SYNOVIAL LEFT KNEE  Final   Special Requests NONE  Final   Gram Stain   Final    ABUNDANT WBC PRESENT, PREDOMINANTLY PMN NO ORGANISMS SEEN    Culture   Final    NO GROWTH Performed at [facility], [address], [place], [place] [address]    Report Status 05/04/2020 FINAL  Final  Aerobic/Anaerobic Culture (surgical/deep wound)     Status: None (Preliminary result)   Collection Time: 05/02/20  8:55 AM   Specimen: Soft Tissue, Other  Result Value Ref Range Status   Specimen Description TISSUE  Final   Special Requests LEFT KNEE TISSUE SPEC A  Final   Gram Stain   Final    ABUNDANT WBC PRESENT, PREDOMINANTLY PMN NO ORGANISMS SEEN    Culture   Final    NO GROWTH 2 DAYS NO ANAEROBES ISOLATED; CULTURE IN PROGRESS FOR 5 DAYS Performed at [facility], [place] [address], [place], [place] [address]    Report Status PENDING  Incomplete  Aerobic/Anaerobic Culture (surgical/deep wound)     Status: None (Preliminary result)   Collection Time: 05/02/20  8:58 AM   Specimen: Soft Tissue, Other  Result Value Ref Range Status   Specimen Description TISSUE  Final   Special Requests RIGHT KNEE TISSUE SPEC B  Final   Gram Stain   Final    RARE WBC PRESENT,BOTH PMN AND MONONUCLEAR NO ORGANISMS  SEEN    Culture   Final    NO GROWTH 2 DAYS NO ANAEROBES ISOLATED; CULTURE IN PROGRESS FOR 5 DAYS Performed at [facility], [place] [address], [place], [place] [address]    Report Status PENDING  Incomplete     Janene Madeira, MSN, NP-C [facility] [email or web address] Pager: [phone] Office: [place]: [phone]

## 2020-05-05 NOTE — Progress Notes (Addendum)
Progress Note  Patient Name: Johnathan Murray Date of Encounter: 05/05/2020  [facility] Cardiologist: Jenean Lindau, MD   Subjective   Says had some fast HR overnight, was aware of them, o/w not aware of Afib. Wife says pt quit breathing while asleep and eyes rolled back, she woke him. Pt says he was having strange dreams, thinks meds are doing this.  Has trouble sleeping because of back/knee pain.  Inpatient Medications    Scheduled Meds: . sodium chloride   Intravenous Once  . amiodarone  400 mg Oral BID  . Chlorhexidine Gluconate Cloth  6 each Topical Daily  . feeding supplement (ENSURE ENLIVE)  237 mL Oral TID BM  . ferrous [id number] C-folic acid  1 capsule Oral Q breakfast  . insulin aspart  0-20 Units Subcutaneous TID WC  . insulin aspart  0-5 Units Subcutaneous QHS  . insulin glargine  10 Units Subcutaneous BID  . levothyroxine  25 mcg Oral QAC breakfast  . metoprolol tartrate  12.5 mg Oral BID  . oxyCODONE  10 mg Oral Q12H  . polyethylene glycol  17 g Oral Daily  . pregabalin  100 mg Oral TID  . senna  1 tablet Oral QHS  . sodium chloride flush  10-40 mL Intracatheter Q12H  . sodium chloride flush  3 mL Intravenous Q12H   Continuous Infusions: . ampicillin (OMNIPEN) IV 2 g (05/05/20 0515)  . cefTRIAXone (ROCEPHIN) IVPB 2 gram/100 mL NS (Mini-Bag Plus) 2 g (05/05/20 0430)   PRN Meds: acetaminophen **OR** acetaminophen, albuterol, cyclobenzaprine, oxyCODONE, sodium chloride flush   Vital Signs    Vitals:   05/04/20 2000 05/04/20 2100 05/05/20 0000 05/05/20 0400  BP: 99/65 114/80 95/61 97/64   Pulse: 94  91 83  Resp: 15  16 16   Temp: 97.9 F (36.6 C) 98.1 F (36.7 C) 98 F (36.7 C) 98 F (36.7 C)  TempSrc:  Oral Oral Axillary  SpO2: 94%  100% 97%  Weight:        Intake/Output Summary (Last 24 hours) at 05/05/2020 0747 Last data filed at 05/05/2020 0515 Gross per 24 hour  Intake 1944 ml  Output 2300 ml  Net -356 ml   Last 3  Weights 05/04/2020 05/03/2020 05/02/2020  Weight (lbs) 205 lb 4 oz 195 lb 8.8 oz 173 lb 15.1 oz  Weight (kg) 93.1 kg 88.7 kg 78.9 kg      Telemetry    Afib, HR controlled this am, was higher overnight, brief spikes of high HR did not last long Apnea on monitor associated w/ poor wave forms - Personally Reviewed  ECG    None today- Personally Reviewed  Physical Exam   GEN: No acute distress.   Neck: No JVD Cardiac: Irreg R&R, soft murmur, no rubs, or gallops.  Respiratory: diminished to auscultation bilaterally   GI: Soft, nontender, non-distended  MS: No edema; knees wrapped Neuro:  Nonfocal  Psych: Normal affect   Labs    High Sensitivity Troponin:  No results for input(s): TROPONINIHS in the last 720 hours.    Chemistry Recent Labs  Lab 05/01/20 0420 05/01/20 0420 05/03/20 0845 05/04/20 0600 05/05/20 0403  NA 135   < > 134* 133* 136  K 4.0   < > 4.6 4.4 4.1  CL 102   < > 102 97* 100  CO2 27   < > 24 27 26   GLUCOSE 102*   < > 269* 200* 172*  BUN 15   < > 25* 24*  20  CREATININE 1.25*   < > 1.44* 1.38* 1.23  CALCIUM 8.7*   < > 8.3* 8.1* 8.3*  PROT 5.7*  --   --   --  4.7*  ALBUMIN 2.0*  --   --   --  1.8*  AST 44*  --   --   --  25  ALT 58*  --   --   --  41  ALKPHOS 126  --   --   --  75  BILITOT 0.8  --   --   --  0.5  GFRNONAA 59*   < > 50* 53* >60  GFRAA >60   < > 58* >60 >60  ANIONGAP 6   < > 8 9 10    < > = values in this interval not displayed.     Hematology Recent Labs  Lab 05/04/20 0600 05/04/20 1714 05/05/20 0403  WBC 10.8* 10.2 9.4  RBC 2.69* 2.40* 2.80*  HGB 7.5* 6.8* 8.1*  HCT 22.7* 20.5* 24.5*  MCV 84.4 85.4 87.5  MCH 27.9 28.3 28.9  MCHC 33.0 33.2 33.1  RDW 19.6* 19.6* 18.4*  PLT 252 231 248    BNPNo results for input(s): BNP, PROBNP in the last 168 hours.   DDimer No results for input(s): DDIMER in the last 168 hours.   Radiology    No results found.  Cardiac Studies   Procedure: 04/16/2020 by Dr. Cyndia Bent  1. Redo median  Sternotomy 2. Right axillary artery graft for cardiopulmonary bypass 3. Extracorporeal circulation 4. Debridement of aortic root abscess and pseudoaneurysm 5.   Aortic root replacement using a 23 mm Homograft aortic root with reimplantation of coronary arteries.  Patient Profile     69 y.o. male with PMH of AVR and CABG x 3 in 07/2016 at [facility], PAF, HTN, DM II and HLD who had cervical spine surgery in Apr 2021 and developed AKI and enterococcal bacteremia and septic arthritis of the knees. MRI w/ discitis and osteomyelitis of the cervical and lumbar spine, underwent disc aspiration by IR on 5/18. TEE showed endocarditis with pseudoaneurysm. Patient eventually underwent debridement of aortic root abscess and pseudoaneurysm and aortic root replacement using a 23 mm homograft aortic root with reimplantation of coronary arteries by Dr. Cyndia Bent on 04/16/2020, to inpt rehab 06/10. Admitted 06/12 from rehab after total synovectomy of both knees when he became hypotensive, was in Afib and needed transfusions.  Assessment & Plan    1. PAF on Eliquis - had TEE/DCCV 05/24 & 06/09 - persistent Afib felt 2nd acute illness, bacteremia, anemia and surgery - Eliquis is on hold 2nd recent surgery and anemia - on metop 12.5 mg bid and amio 400 mg bid >>no doses missed - HR appropriate for acute illness  2. CAD s/p CABG in 2017 - cath 01/2020 w/ 2/3 grafts occluded, no sig change from 2019 - was not on ASA pta 2nd Eliquis - on BB and Zetia - no lipid profile in system since 2019, f/u as outpt  3. Aortic stenosis s/p AVR 2017 with subsequent redo of the median sternotomy, right axillary artery graft for cardiopulmonary bypass, debridement of aortic root abscess and pseudoaneurysm, and ARR (using a35mm Homograft aortic root with reimplantation of coronary arteries) on 04/16/2020.  - per TCTS  4. HTN - SBP 90s at times, no med changes  5. Anemia: s/p blood transfusion - H&H dropped after surgery - no  other acute blood loss known  6. Bilateral septic knee - s/p  bilat synovectomy - pt says will try to put weight on his knees today - per Dr Sharol Given, IM   For questions or updates, please contact [facility] Please consult [email or web address] for contact info under        Signed, Rosaria Ferries, PA-C  05/05/2020, 7:47 AM    Personally seen and examined. Agree with above.   In regards to his atrial fibrillation, continue with amiodarone, anticoagulation however anemia still persists.  Required transfusion.  Very concerned about this.  Once infection has cleared and inflammation has quelled, could consider repeat attempt at cardioversion.  For now continue with rate control on his atrial fibrillation.  Candee Furbish, MD

## 2020-05-05 NOTE — Progress Notes (Signed)
Physical Therapy Treatment Patient Details Name: Oval Cavazos MRN: [id number] DOB: 05-21-1951 Today's Date: 05/05/2020    History of Present Illness Patient is a 69 y.o. 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  Patient was seen by cardiothoracic surgery-underwent debridement of aortic root abscess/aortic valve replacement with bioprosthetic valve 04/17/20 via median sternotomy-ID was consulted-patient was subsequently discharged to [facility] on 6/10.  While at CIR-patient started having gradually worsening anemia, bilateral knee pain/swelling-arthrocentesis was suggestive of septic knee-patient subsequently underwent irrigation/debridement and total synovectomy of bilateral knees-post procedure-patient was noted to have A. fib with RVR and severe anemia-and readmitted to the hospitalist service.    PT Comments    Patient was able to stand with weight on bil LEs with RW for up to 45 seconds. Supine exercises completed prior to sitting EOB to incr circulation. Incr time spent on education re: positioning for 1) decreasing LE edema to help reduce pain, 2) for relieving back pain, and 3) for skin integrity over sacrum. RN made aware of skin issues on sacrum observed while assisting pt with bedpan.      Follow Up Recommendations  CIR     Equipment Recommendations  None recommended by PT    Recommendations for Other Services OT consult     Precautions / Restrictions Precautions Precautions: Sternal;Fall Precaution Comments: sternal Required  Braces or Orthoses: Spinal Brace Spinal Brace: Lumbar corset Other Brace: for comfort, pt reports "it doesn't really help me" so we didn't use back brace Restrictions Other Position/Activity Restrictions: sternal precautions    Mobility  Bed Mobility Overal bed mobility: Needs Assistance Bed Mobility: Rolling;Sidelying to Sit;Sit to Sidelying Rolling: Min assist (with rail) Sidelying to sit: Mod assist;+2 for physical assistance     Sit to sidelying: Mod assist;+2 for safety/equipment General bed mobility comments: entire process repeated x 2 as pt had to return to supine to use bedpan prior to attempting to stand; assisted LEs over EOB with bed pad, mod assist to raise trunk, guided trunk while LEs returned to bed  Transfers Overall transfer level: Needs assistance Equipment used: Rolling walker (2 wheeled) Transfers: Sit to/from Stand Sit to Stand: Mod assist;From elevated surface;+2 physical assistance        Lateral/Scoot Transfers: Max assist;+2 physical assistance General transfer comment: Bed elevated with hips ~8" higher than knees; pt allowed to put hands on RW handles to push up;Lateral scoot x 3 with bed pad  Ambulation/Gait Ambulation/Gait assistance: Min assist Gait Distance (Feet): 0 Feet Assistive device: Rolling walker (2 wheeled)       General Gait Details: pre-gait wt-shifiting; able to lift Lt foot off floor, could not lift rt foot   Stairs             Wheelchair Mobility    Modified Rankin (Stroke Patients Only)       Balance Overall balance assessment: Needs assistance Sitting-balance support: Feet supported;No upper extremity supported Sitting balance-Leahy Scale: Fair     Standing balance support: During functional activity;Bilateral upper extremity supported Standing balance-Leahy Scale: Poor Standing balance comment: dependent on RW  Cognition Arousal/Alertness: Awake/alert Behavior During  Therapy: WFL for tasks assessed/performed Overall Cognitive Status: Within Functional Limits for tasks assessed                                        Exercises General Exercises - Upper Extremity Shoulder Flexion: 10 reps;AROM;Supine (move "in the tube") General Exercises - Lower Extremity Ankle Circles/Pumps: AROM;Both;10 reps;Supine (2 sets) Quad Sets: AROM;Both;Supine;5 reps Heel Slides: AROM;Both;Supine;AAROM;5 reps    General Comments General comments (skin integrity, edema, etc.): HR 92 up to 145 bpm in standing; BP stable supine to sit      Pertinent Vitals/Pain Pain Assessment: 0-10 Pain Score: 10-Worst pain ever Pain Location: low back Pain Descriptors / Indicators: Grimacing;Guarding;Moaning Pain Intervention(s): Limited activity within patient's tolerance;Monitored during session;Premedicated before session;Repositioned    Home Living                      Prior Function            PT Goals (current goals can now be found in the care plan section) Acute Rehab PT Goals Patient Stated Goal: to get stronger and go back to work Time For Goal Achievement: 05/17/20 Potential to Achieve Goals: Good Progress towards PT goals: Progressing toward goals    Frequency    Min 3X/week      PT Plan Current plan remains appropriate    Co-evaluation              AM-PAC PT "6 Clicks" Mobility   Outcome Measure  Help needed turning from your back to your side while in a flat bed without using bedrails?: A Little Help needed moving from lying on your back to sitting on the side of a flat bed without using bedrails?: A Lot Help needed moving to and from a bed to a chair (including a wheelchair)?: Total Help needed standing up from a chair using your arms (e.g., wheelchair or bedside chair)?: A Lot Help needed to walk in hospital room?: Total Help needed climbing 3-5 steps with a railing? : Total 6 Click Score: 10    End of Session  Equipment Utilized During Treatment: Other (comment) (bed pad) Activity Tolerance: Patient limited by pain Patient left: in bed;with call bell/phone within reach;with family/visitor present Nurse Communication: Mobility status PT Visit Diagnosis: Other abnormalities of gait and mobility (R26.89);Muscle weakness (generalized) (M62.81);Pain Pain - part of body: Knee (and back)     Time: 5573-2202 PT Time Calculation (min) (ACUTE ONLY): 44 min  Charges:  $Therapeutic Exercise: 8-22 mins $Therapeutic Activity: 8-22 mins $Self Care/Home Management: 8-22                      Arby Barrette, PT Pager [phone]    Rexanne Mano 05/05/2020, 11:12 AM

## 2020-05-05 NOTE — Progress Notes (Signed)
Inpatient Rehab Admissions Coordinator:   Pt returned to acute setting follow bilateral knee synovectomy.  Post surgical recommendations for pt to return to CIR.  Will place an order per our protocol and f/u with patient.   Shann Medal, PT, DPT Admissions Coordinator [phone] 05/05/20  10:48 AM

## 2020-05-06 LAB — GLUCOSE, CAPILLARY
Glucose-Capillary: 146 mg/dL — ABNORMAL HIGH (ref 70–99)
Glucose-Capillary: 197 mg/dL — ABNORMAL HIGH (ref 70–99)
Glucose-Capillary: 144 mg/dL — ABNORMAL HIGH (ref 70–99)
Glucose-Capillary: 109 mg/dL — ABNORMAL HIGH (ref 70–99)

## 2020-05-06 LAB — CBC
HCT: 24.3 % — ABNORMAL LOW (ref 39.0–52.0)
Hemoglobin: 7.7 g/dL — ABNORMAL LOW (ref 13.0–17.0)
MCH: 28.6 pg (ref 26.0–34.0)
MCHC: 31.7 g/dL (ref 30.0–36.0)
MCV: 90.3 fL (ref 80.0–100.0)
Platelets: 293 10*3/uL (ref 150–400)
RBC: 2.69 MIL/uL — ABNORMAL LOW (ref 4.22–5.81)
RDW: 18.6 % — ABNORMAL HIGH (ref 11.5–15.5)
WBC: 9.2 10*3/uL (ref 4.0–10.5)
nRBC: 0 % (ref 0.0–0.2)

## 2020-05-06 LAB — APTT: aPTT: 49 seconds — ABNORMAL HIGH (ref 24–36)

## 2020-05-06 LAB — HEPARIN LEVEL (UNFRACTIONATED): Heparin Unfractionated: 0.56 IU/mL (ref 0.30–0.70)

## 2020-05-06 MED ORDER — MORPHINE SULFATE (PF) 2 MG/ML IV SOLN
1.0000 mg | INTRAVENOUS | Status: DC | PRN
  Administered 2020-05-07 – 2020-05-13 (×13): 2 mg via INTRAVENOUS
  Filled 2020-05-06 (×16): qty 1

## 2020-05-06 MED ORDER — HEPARIN (PORCINE) 25000 UT/250ML-% IV SOLN
1100.0000 [IU]/h | INTRAVENOUS | Status: AC
  Administered 2020-05-06: 1100 [IU]/h via INTRAVENOUS
  Filled 2020-05-06: qty 250

## 2020-05-06 MED ORDER — AMOXICILLIN 500 MG PO CAPS: 500.0000 mg | ORAL_CAPSULE | Freq: Two times a day (BID) | ORAL | Status: DC

## 2020-05-06 MED ORDER — AMOXICILLIN 500 MG PO CAPS: 500.0000 mg | ORAL_CAPSULE | Freq: Two times a day (BID) | ORAL | 3 refills | Status: DC

## 2020-05-06 NOTE — Progress Notes (Signed)
PROGRESS NOTE        PATIENT DETAILS Name: Johnathan Murray Age: 69 y.o. Sex: male Date of Birth: 26-Dec-1950 Admit Date: 05/02/2020 Admitting Physician Norval Morton, MD [id number], [name], MD  Brief Narrative: Patient is a 69 y.o. 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  Patient was seen by cardiothoracic surgery-underwent debridement of aortic root abscess/aortic valve replacement with bioprosthetic valve-ID was consulted-patient was subsequently discharged to CIR on 6/10.  While at CIR-patient started having gradually worsening anemia, bilateral knee pain/swelling-arthrocentesis was suggestive of septic knee-patient subsequently underwent irrigation/debridement and total synovectomy of bilateral knees-postoperatively-patient was noted to have A. fib with RVR and severe anemia-and readmitted to the hospitalist service.  See below for further details.  Significant events: 4/29-5/18>> admit by [facility] for sepsis secondary to Enterococcus faecalis, found to have acute osteo/discitis of L/-/5, left knee septic arthritis-discharge to CIR  5/18-5/24>> at CIR-aortic root abscess/prosthetic aortic valve vegetation on TEE (done for cardioversion) 5/24-6/10>> admit by [facility] from CIR-for aortic root replacement-discharged back to CIR. 6/10-6/12>> at CIR- recurrent bilateral knee septic arthritis on arthrocentesis-ortho reconsulted 6/12-current>> admit to [facility] for A. fib with RVR, severe anemia following bilateral knee washout for  septic arthritis.  Significant postoperative bleeding from left knee.  Significant studies: 4/29>> CT abdomen/pelvis: No acute intra-abdominal or pelvic pathology 4/29>> CT C-spine: Interval removal of fixating plate/screws from C4-C6, no acute osseous abnormality 4/30>>left lower extremity Doppler: No DVT, ruptured Baker's cyst 5/3>> MRI C-spine: No evidence of infection, s/p C3-4 discectomy/fusion. 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  Procedures : 6/12>> irrigation/debridement with total synovectomy of bilateral knees 6/9>> TEE guided cardioversion 5/27>>S/p Redo median sternotomy, rightaxillaryartery graftfor cardiopulmonary bypass, extracorporeal circulation, debridement of aortic root abscess and pseudoaneurysm, and aortic root replacement using a 54mm Homograft aortic root with reimplantation of coronary arteries. 5/24>> TEE guided cardioversion.  Found to have vegetation on prosthetic aortic valve on TEE. 5/18>> fluoroscopy guided needle aspiration of L4-L5 disc space 5/4>> B/L knee irrigation irrigation debridement arthroscopic. B/L knee medial and lateral menisectomies.  Antimicrobial therapy: Ampicillin: 5/18>> Rocephin: 5/19>>  Microbiology data: 6/12>> knee culture: No growth 6/10>> left knee synovial fluid culture: No growth  5/27>> aortic valve tissue culture: No  growth 5/26>> blood culture: No growth 5/22>> urine culture: No growth 5/19>> left synovitic fluid culture: No growth 5/18>> L4-L5 disc aspirate: Enterococcus faecalis 5/6>> blood culture: No growth 5/3>> blood culture: No growth 5/2>> right knee synovial fluid: No growth 5/1>> left knee synovial fluid: Enterococcus faecalis 4/30>> blood culture: Enterococcus faecalis 4/29>> urine  culture: No growth  Consults: ID CTVS Cardiology Ortho  DVT Prophylaxis : SCD's  Subjective: Lying comfortably in bed. No major issues overnight.   Assessment/Plan: Hemorrhagic shock 2/2 to post op bleeding from left knee with acute blood loss anemia superimposed on anemia of chronic disease: Shock physiology has resolved-transfused a total of 5 units of PRBC this admission.  Left knee bleeding seems to have essentially resolved.Evaluated by Ortho this morning-ok to restart anticoagulation  A. fib with RVR: Rate controlled but remains in atrial fibrillation-continue amiodarone and low-dose beta-blocker.  Cardiology following.  Per Ortho-ok to start anticoagulation-will start IV heparin for 24 hours-if bleeding does not recur-will plan on Eliquis from tomorrow  Enterococcus bacteremia-with L4-L5 discitis, bilateral septic knee, prosthetic valve vegetation with aortic root abscess: Remains on IV ampicillin/Rocephin-ID following-intraoperative synovial fluid cultures on 6/12 neg so far.  Postop care regarding bilateral knees defer to orthopedics.  CAD-s/p CABG-no anginal symptoms-antiplatelets/anticoagulation on hold.  Hypothyroidism: Continue levothyroxine  DM-2 with uncontrolled hyperglycemia: CBGs relatively stable-continue Lantus 10 units twice daily, SSI.  Follow and adjust.     Recent Labs    05/04/20 2104 05/05/20 1647 05/06/20 0747  GLUCAP 166* 208* 146*   Debility/deconditioning: Secondary to acute illness-we will need PT/OT-possibly back to CIR at some point.   Diet: Diet Order             Diet Carb Modified Fluid consistency: Thin; Room service appropriate? Yes  Diet effective now                Code Status: Full code   Family Communication: Spouse at bedside  Disposition Plan: Status is: Inpatient  Remains inpatient appropriate because:Inpatient level of care appropriate due to severity of illness  Dispo: The patient is from: Home              Anticipated d/c is to: CIR              Anticipated d/c date is: > 3 days              Patient currently is not medically stable to d/c.   Barriers to Discharge: Hemorrhagic shock with acute blood loss anemia-bilateral septic arthritis of knees-on IV antibiotics-remains A. fib with RVR.  Antimicrobial agents: Anti-infectives (From admission, onward)   Start     Dose/Rate Route Frequency Ordered Stop   05/03/20 0500  cefTRIAXone (ROCEPHIN) 2 g in sodium chloride 0.9 % 100 mL IVPB     Discontinue     2 g 200 mL/hr over 30 Minutes Intravenous Every 12 hours 05/02/20 2155     05/02/20 2245  cefTRIAXone (ROCEPHIN) 2 g in sodium chloride 0.9 % 100 mL IVPB  Status:  Discontinued        2 g 200 mL/hr over 30 Minutes Intravenous Every 12 hours 05/02/20 1943 05/02/20 2155   05/02/20 1800  ampicillin (OMNIPEN) 2 g in sodium chloride 0.9 % 100 mL IVPB     Discontinue     2 g 300 mL/hr over 20 Minutes Intravenous Every 4 hours 05/02/20 1745         Time spent: 25 minutes-Greater than 50% of this time was spent in counseling, explanation of diagnosis, planning of further management, and coordination of care.  MEDICATIONS: Scheduled Meds: . amiodarone  400 mg Oral BID  . Chlorhexidine Gluconate Cloth  6 each Topical Daily  . feeding supplement (ENSURE ENLIVE)  237 mL Oral TID BM  . ferrous [id number] C-folic acid  1 capsule  Oral Q breakfast  . insulin aspart  0-20 Units Subcutaneous TID WC  . insulin aspart  0-5 Units Subcutaneous QHS  . insulin glargine  10 Units Subcutaneous BID  . levothyroxine  25 mcg  Oral QAC breakfast  . metoprolol tartrate  12.5 mg Oral BID  . oxyCODONE  10 mg Oral Q12H  . polyethylene glycol  17 g Oral Daily  . pregabalin  100 mg Oral TID  . senna  1 tablet Oral QHS  . sodium chloride flush  10-40 mL Intracatheter Q12H  . sodium chloride flush  3 mL Intravenous Q12H   Continuous Infusions: . ampicillin (OMNIPEN) IV 2 g (05/06/20 0920)  . cefTRIAXone (ROCEPHIN) IVPB 2 gram/100 mL NS (Mini-Bag Plus) 2 g (05/06/20 0454)   PRN Meds:.acetaminophen **OR** acetaminophen, albuterol, cyclobenzaprine, oxyCODONE, sodium chloride flush   PHYSICAL EXAM: Vital signs: Vitals:   05/05/20 1957 05/06/20 0012 05/06/20 0331 05/06/20 0749  BP: 113/68 (!) 100/55 99/60 107/67  Pulse: (!) 108 90 89 97  Resp: 16 20 16 16   Temp: 97.9 F (36.6 C) 98 F (36.7 C) 98.3 F (36.8 C) 98.3 F (36.8 C)  TempSrc: Oral Oral Axillary Oral  SpO2: 97% 94% 99% 97%  Weight:       Filed Weights   05/03/20 0405 05/04/20 0441  Weight: 88.7 kg 93.1 kg   Body mass index is 29.45 kg/m.   Gen Exam:Alert awake-not in any distress HEENT:atraumatic, normocephalic Chest: B/L clear to auscultation anteriorly CVS:S1S2 regular Abdomen:soft non tender, non distended Extremities:no edema Neurology: Non focal Skin: no rash  I have personally reviewed following labs and imaging studies  LABORATORY DATA: CBC: Recent Labs  Lab 05/01/20 0420 05/02/20 2252 05/03/20 2125 05/04/20 0600 05/04/20 1714 05/05/20 0403 05/06/20 0500  WBC 6.8   < > 10.2 10.8* 10.2 9.4 9.2  NEUTROABS 4.8  --   --   --   --   --   --   HGB 7.4*   < > 7.3* 7.5* 6.8* 8.1* 7.7*  HCT 24.7*   < > 21.8* 22.7* 20.5* 24.5* 24.3*  MCV 96.5   < > 88.3 84.4 85.4 87.5 90.3  PLT 283   < > 226 252 231 248 293   < > = values in this interval not displayed.    Basic Metabolic Panel: Recent Labs  Lab 05/01/20 0420 05/03/20 0845 05/04/20 0600 05/05/20 0403  NA 135 134* 133* 136  K 4.0 4.6 4.4 4.1  CL 102 102 97* 100  CO2  27 24 27 26   GLUCOSE 102* 269* 200* 172*  BUN 15 25* 24* 20  CREATININE 1.25* 1.44* 1.38* 1.23  CALCIUM 8.7* 8.3* 8.1* 8.3*  MG  --  1.9  --   --     GFR: Estimated Creatinine Clearance: 66.8 mL/min (by C-G formula based on SCr of 1.23 mg/dL).  Liver Function Tests: Recent Labs  Lab 05/01/20 0420 05/05/20 0403  AST 44* 25  ALT 58* 41  ALKPHOS 126 75  BILITOT 0.8 0.5  PROT 5.7* 4.7*  ALBUMIN 2.0* 1.8*   No results for input(s): LIPASE, AMYLASE in the last 168 hours. No results for input(s): AMMONIA in the last 168 hours.  Coagulation Profile: No results for input(s): INR, PROTIME in the last 168 hours.  Cardiac Enzymes: No results for input(s): CKTOTAL, CKMB, CKMBINDEX, TROPONINI in the last 168 hours.  BNP (last 3 results) No results for input(s): PROBNP in the last 8760 hours.  Lipid Profile: No results  for input(s): CHOL, HDL, LDLCALC, TRIG, CHOLHDL, LDLDIRECT in the last 72 hours.  Thyroid Function Tests: No results for input(s): TSH, T4TOTAL, FREET4, T3FREE, THYROIDAB in the last 72 hours.  Anemia Panel: No results for input(s): VITAMINB12, FOLATE, FERRITIN, TIBC, IRON, RETICCTPCT in the last 72 hours.  Urine analysis:    Component Value Date/Time   COLORURINE YELLOW 04/11/2020 2245   APPEARANCEUR CLEAR 04/11/2020 2245   LABSPEC 1.009 04/11/2020 2245   PHURINE 7.0 04/11/2020 2245   GLUCOSEU NEGATIVE 04/11/2020 2245   HGBUR NEGATIVE 04/11/2020 2245   BILIRUBINUR NEGATIVE 04/11/2020 2245   KETONESUR NEGATIVE 04/11/2020 2245   PROTEINUR NEGATIVE 04/11/2020 2245   NITRITE NEGATIVE 04/11/2020 2245   LEUKOCYTESUR NEGATIVE 04/11/2020 2245    Sepsis Labs: Lactic Acid, Venous    Component Value Date/Time   LATICACIDVEN 1.0 03/20/2020 1517    MICROBIOLOGY: Recent Results (from the past 240 hour(s))  Body fluid culture     Status: None   Collection Time: 04/30/20  5:28 PM   Specimen: Synovium; Body Fluid  Result Value Ref Range Status   Specimen  Description SYNOVIAL LEFT KNEE  Final   Special Requests NONE  Final   Gram Stain   Final    ABUNDANT WBC PRESENT, PREDOMINANTLY PMN NO ORGANISMS SEEN    Culture   Final    NO GROWTH Performed at [facility], [address] [place], [place] [address]    Report Status 05/04/2020 FINAL  Final  Aerobic/Anaerobic Culture (surgical/deep wound)     Status: None (Preliminary result)   Collection Time: 05/02/20  8:55 AM   Specimen: Soft Tissue, Other  Result Value Ref Range Status   Specimen Description TISSUE  Final   Special Requests LEFT KNEE TISSUE SPEC A  Final   Gram Stain   Final    ABUNDANT WBC PRESENT, PREDOMINANTLY PMN NO ORGANISMS SEEN    Culture   Final    NO GROWTH 3 DAYS NO ANAEROBES ISOLATED; CULTURE IN PROGRESS FOR 5 DAYS Performed at [facility], [place] [address] [place], [place] [address]    Report Status PENDING  Incomplete  Aerobic/Anaerobic Culture (surgical/deep wound)     Status: None (Preliminary result)   Collection Time: 05/02/20  8:58 AM   Specimen: Soft Tissue, Other  Result Value Ref Range Status   Specimen Description TISSUE  Final   Special Requests RIGHT KNEE TISSUE SPEC B  Final   Gram Stain   Final    RARE WBC PRESENT,BOTH PMN AND MONONUCLEAR NO ORGANISMS SEEN    Culture   Final    NO GROWTH 3 DAYS NO ANAEROBES ISOLATED; CULTURE IN PROGRESS FOR 5 DAYS Performed at [facility], [place] [address] [place], [place] [address]    Report Status PENDING  Incomplete    RADIOLOGY STUDIES/RESULTS: No results found.   LOS: 4 days   Oren Binet, MD  [facility]    To contact the attending provider between 7A-7P or the covering provider during after hours 7P-7A, please log into the web site [email or web address] and access using universal [place] password for that web site. If you do not have the password, please call the hospital operator.  05/06/2020, 9:25 AM

## 2020-05-06 NOTE — Progress Notes (Addendum)
Physical Therapy Treatment Patient Details Name: Johnathan Murray MRN: [id number] DOB: 1951-09-12 Today's Date: 05/06/2020    History of Present Illness Patient is a 69 y.o. 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  Patient was seen by cardiothoracic surgery-underwent debridement of aortic root abscess/aortic valve replacement with bioprosthetic valve 04/17/20 via median sternotomy-ID was consulted-patient was subsequently discharged to [place] on 6/10.  While at CIR-patient started having gradually worsening anemia, bilateral knee pain/swelling-arthrocentesis was suggestive of septic knee-patient subsequently underwent irrigation/debridement and total synovectomy of bilateral knees-post procedure-patient was noted to have A. fib with RVR and severe anemia-and readmitted to the hospitalist service.    PT Comments    Patient not able to tolerate standing with bed elevated and RW as he did yesterday. In fact, as soon as he put weight on his legs, bil knees buckled with pt assisted back onto bed and fall prevented. In addition to exercises, incr time spent with pt/wife to develop a plan moving forward to address his pain and his safety during therapy. Pt/wife very appreciative. Spoke with Dr. Sloan Leiter and he agreed to add a pain medication he can have specifically prior to therapy.     Follow Up Recommendations  [facility]     Equipment Recommendations  None recommended by PT    Recommendations for Other Services       Precautions / Restrictions  Precautions Precautions: Sternal;Fall Precaution Comments: sternal Required Braces or Orthoses: Spinal Brace Spinal Brace: Lumbar corset Other Brace: for comfort, pt reports "it doesn't really help me" so we didn't use back brace Restrictions Other Position/Activity Restrictions: sternal precautions    Mobility  Bed Mobility Overal bed mobility: Needs Assistance Bed Mobility: Rolling;Sidelying to Sit;Sit to Sidelying Rolling: Mod assist;+2 for physical assistance (with rail) Sidelying to sit: Mod assist;+2 for physical assistance     Sit to sidelying: Mod assist;+2 for safety/equipment General bed mobility comments: requires assist at his legs due to painful knees and at torso  Transfers Overall transfer level: Needs assistance Equipment used: Rolling walker (2 wheeled) Transfers: Sit to/from Stand Sit to Stand: Mod assist;From elevated surface;+2 physical assistance        Lateral/Scoot Transfers: +2 physical assistance;Mod assist General transfer comment: Bed elevated with hips ~8" higher than knees; pt allowed to put hands on RW handles to push up and as soon as he put weight on bil LEs, his knees buckled and used pad to get his hips back on the bed; lowered bed and Lateral scoot x 3 with bed pad  Ambulation/Gait                 Stairs             Wheelchair Mobility    Modified Rankin (Stroke Patients Only)       Balance Overall balance assessment: Needs assistance Sitting-balance support: Feet supported;No upper extremity supported Sitting balance-Leahy Scale: Fair     Standing balance support: During functional activity;Bilateral upper extremity supported Standing balance-Leahy Scale: Zero Standing balance comment: unable to stand due to pain, knees buckling  Cognition Arousal/Alertness: Lethargic;Suspect due to medications (and due to lack of sleep) Behavior During Therapy: [facility] for tasks  assessed/performed                                   General Comments: cognition NT; per RN not oriented to place      Exercises General Exercises - Upper Extremity Shoulder Flexion:  (move "in the tube") General Exercises - Lower Extremity Ankle Circles/Pumps: AROM;Both;10 reps;Supine Quad Sets: AROM;Both;Supine;5 reps Gluteal Sets: AROM;Both;5 reps;Supine Long Arc Quad: AROM;Both;Seated;Other reps (comment) Heel Slides: AROM;Both;Supine;AAROM;5 reps    General Comments General comments (skin integrity, edema, etc.): Incr time discussing barriers to progressing with PT. Pt/wife frustrated at his current pain medication regimen and report he did much better with therapy when he could have morphine prior to PT. (Later discussed with Dr. Sloan Leiter and he agreed to order). Discussed pt's limitations with sternal precautions and knee pain with buckling this date. Educated on Sara Geophysicist/field seismologist and potential benefit and Building services engineer. Pt/wife requesting male therapist and late morning or afternoon sessions. Explained we could try to accomodate these requests, however male PTs are limited in number and availability of +2 tech help often dictates times available for sessions. Both pt/wife very pleasant during discussion and grateful for attempts today and for planning for future sesssions.       Pertinent Vitals/Pain Pain Assessment: 0-10 Faces Pain Scale: Hurts worst (bil knees with weight-bearing) Pain Location: bil knee> back Pain Descriptors / Indicators: Grimacing;Guarding;Moaning Pain Intervention(s): Limited activity within patient's tolerance;Monitored during session;Premedicated before session;Repositioned    Home Living                      Prior Function            PT Goals (current goals can now be found in the care plan section) Acute Rehab PT Goals Patient Stated Goal: to get stronger and go back to work PT Goal Formulation: With patient/family Time  For Goal Achievement: 05/17/20 Potential to Achieve Goals: Good Progress towards PT goals: Not progressing toward goals - comment (due to pain)    Frequency    Min 4X/week      PT Plan Current plan remains appropriate    Co-evaluation              AM-PAC PT "6 Clicks" Mobility   Outcome Measure  Help needed turning from your back to your side while in a flat bed without using bedrails?: A Lot Help needed moving from lying on your back to sitting on the side of a flat bed without using bedrails?: A Lot Help needed moving to and from a bed to a chair (including a wheelchair)?: Total Help needed standing up from a chair using your arms (e.g., wheelchair or bedside chair)?: Total Help needed to walk in hospital room?: Total Help needed climbing 3-5 steps with a railing? : Total 6 Click Score: 8    End of Session Equipment Utilized During Treatment: Other (comment) (bed pad) Activity Tolerance: Patient limited by pain Patient left: in bed;with call bell/phone within reach;with family/visitor present Nurse Communication: Mobility status;Other (comment);Need for lift equipment;Precautions (buckles; Clarise Cruz or stedy if OOB attempted) PT Visit Diagnosis: Other abnormalities of gait and mobility (R26.89);Muscle weakness (generalized) (M62.81);Pain Pain - Right/Left:  (both) Pain - part of body: Knee (and back)     Time: 9892-1194 PT Time Calculation (min) (ACUTE  ONLY): 46 min  Charges:  $Therapeutic Exercise: 8-22 mins $Therapeutic Activity: 8-22 mins $Self Care/Home Management: 8-22                      Arby Barrette, PT Pager [phone]    Rexanne Mano 05/06/2020, 1:23 PM

## 2020-05-06 NOTE — Progress Notes (Signed)
[facility] for Infectious Disease  Date of Admission:  05/02/2020   Total Antibiotic Days: 78  Ceftriaxone + Ampicillin Day 28      ASSESSMENT: Continue treatment with dual therapy ampicillin + ceftriaxone to complete 2 more total weeks through 05/20/2020 with transition to suppressive oral amoxicillin 500 mg BID thereafter for a duration yet to be determined, but given his course of infection suspect a year at least. We will work to arrange [place] to dispense amox before discharge home.   Please continue to monitor at least weekly CBC, CMP, ESR, CRP   We have arranged follow up appointment with Dr. Megan Salon in [place] clinic on 06/03/2020 @ 2:45 pm --> this can be adjusted if he is still in CIR.    PLAN: 1. Ceftriaxone + Ampicillin through 05/20/20 2. Start PO Amoxicillin 500 mg BID 05/21/2020 3. Weekly CBC, CMP, ESR, CRP   Will sign off for now but happy to see him back before discharge if there is a change in condition or questions.     Principal Problem:   Bacteremia due to Enterococcus Active Problems:   Diabetes mellitus due to underlying condition with unspecified complications (HCC)   Dyslipidemia   PAF (paroxysmal atrial fibrillation) (HCC)   S/P AVR   Septic arthritis of knee, bilateral (HCC)   Prosthetic valve endocarditis (HCC)   Acute blood loss anemia   Prolonged QT interval   Transient hypotension   Lumbar discitis   . amiodarone  400 mg Oral BID  . Chlorhexidine Gluconate Cloth  6 each Topical Daily  . feeding supplement (ENSURE ENLIVE)  237 mL Oral TID BM  . ferrous [id number] C-folic acid  1 capsule Oral Q breakfast  . insulin aspart  0-20 Units Subcutaneous TID WC  . insulin aspart  0-5 Units Subcutaneous QHS  . insulin glargine  10 Units Subcutaneous BID  . levothyroxine  25 mcg Oral QAC breakfast  . metoprolol tartrate  12.5 mg Oral BID  . oxyCODONE  10 mg Oral Q12H  . polyethylene glycol  17 g Oral Daily  . pregabalin   100 mg Oral TID  . senna  1 tablet Oral QHS  . sodium chloride flush  10-40 mL Intracatheter Q12H  . sodium chloride flush  3 mL Intravenous Q12H    SUBJECTIVE: Had a rough night with regards to timing of pain medications and physical therapy. Didn't sleep well.  Disappointed with lack of ability to walk today given right leg pain/immobility.    Review of Systems: Review of Systems  Constitutional: Negative for chills, diaphoresis and fever.  Respiratory: Negative for cough and shortness of breath.   Cardiovascular: Positive for palpitations and leg swelling. Negative for chest pain.  Gastrointestinal: Negative for abdominal pain, diarrhea and vomiting.  Musculoskeletal: Positive for back pain and joint pain. Negative for myalgias.  Skin: Negative for itching and rash.  Neurological: Negative for dizziness.  Psychiatric/Behavioral: Negative for depression.    Allergies  Allergen Reactions  . Atorvastatin Other (See Comments)    Urinary retention   . Nebivolol Diarrhea    OBJECTIVE: Vitals:   05/06/20 0012 05/06/20 0331 05/06/20 0749 05/06/20 1000  BP: (!) 100/55 99/60 107/67   Pulse: 90 89 97   Resp: '20 16 16   '$ Temp: 98 F (36.7 C) 98.3 F (36.8 C) 98.3 F (36.8 C)   TempSrc: Oral Axillary Oral   SpO2: 94% 99% 97%   Weight:  Height:    '5\' 10"'$  (1.778 m)   Body mass index is 29.45 kg/m.  Physical Exam Nursing note reviewed.  Constitutional:      Appearance: He is well-developed.     Comments: Resting in bed. Having a hard time getting comfortable.   HENT:     Mouth/Throat:     Mouth: Mucous membranes are moist.     Dentition: Normal dentition. No dental abscesses.  Eyes:     General: No scleral icterus.    Pupils: Pupils are equal, round, and reactive to light.  Cardiovascular:     Rate and Rhythm: Normal rate and regular rhythm.     Heart sounds: Normal heart sounds.  Pulmonary:     Effort: Pulmonary effort is normal.     Breath sounds: Normal  breath sounds.  Abdominal:     General: There is no distension.     Palpations: Abdomen is soft.     Tenderness: There is no abdominal tenderness.  Musculoskeletal:     Comments: Bilateral knees wrapped in clean/dry ACE wraps.  Swelling improved compared to when I last saw him before heart surgery.   Lymphadenopathy:     Cervical: No cervical adenopathy.  Skin:    General: Skin is warm and dry.     Capillary Refill: Capillary refill takes less than 2 seconds.     Findings: No rash.  Neurological:     Mental Status: He is alert and oriented to person, place, and time.  Psychiatric:        Judgment: Judgment normal.     Comments:       Lab Results Lab Results  Component Value Date   WBC 9.2 05/06/2020   HGB 7.7 (L) 05/06/2020   HCT 24.3 (L) 05/06/2020   MCV 90.3 05/06/2020   PLT 293 05/06/2020    Lab Results  Component Value Date   CREATININE 1.23 05/05/2020   BUN 20 05/05/2020   NA 136 05/05/2020   K 4.1 05/05/2020   CL 100 05/05/2020   CO2 26 05/05/2020    Lab Results  Component Value Date   ALT 41 05/05/2020   AST 25 05/05/2020   ALKPHOS 75 05/05/2020   BILITOT 0.5 05/05/2020     Microbiology: Recent Results (from the past 240 hour(s))  Body fluid culture     Status: None   Collection Time: 04/30/20  5:28 PM   Specimen: Synovium; Body Fluid  Result Value Ref Range Status   Specimen Description SYNOVIAL LEFT KNEE  Final   Special Requests NONE  Final   Gram Stain   Final    ABUNDANT WBC PRESENT, PREDOMINANTLY PMN NO ORGANISMS SEEN    Culture   Final    NO GROWTH Performed at [facility], [address], [place], [place] [address]    Report Status 05/04/2020 FINAL  Final  Aerobic/Anaerobic Culture (surgical/deep wound)     Status: None (Preliminary result)   Collection Time: 05/02/20  8:55 AM   Specimen: Soft Tissue, Other  Result Value Ref Range Status   Specimen Description TISSUE  Final   Special Requests LEFT KNEE TISSUE SPEC A  Final    Gram Stain   Final    ABUNDANT WBC PRESENT, PREDOMINANTLY PMN NO ORGANISMS SEEN    Culture   Final    NO GROWTH 3 DAYS NO ANAEROBES ISOLATED; CULTURE IN PROGRESS FOR 5 DAYS Performed at [facility], [place] [address], [place], [place] [address]  Report Status PENDING  Incomplete  Aerobic/Anaerobic Culture (surgical/deep wound)     Status: None (Preliminary result)   Collection Time: 05/02/20  8:58 AM   Specimen: Soft Tissue, Other  Result Value Ref Range Status   Specimen Description TISSUE  Final   Special Requests RIGHT KNEE TISSUE SPEC B  Final   Gram Stain   Final    RARE WBC PRESENT,BOTH PMN AND MONONUCLEAR NO ORGANISMS SEEN    Culture   Final    NO GROWTH 3 DAYS NO ANAEROBES ISOLATED; CULTURE IN PROGRESS FOR 5 DAYS Performed at [facility], [place] [address], [place], [place] [address]    Report Status PENDING  Incomplete     Janene Madeira, MSN, NP-C [place] for Infectious Disease [email or web address] Pager: [phone] Office: [phone] [place]: [phone]

## 2020-05-06 NOTE — Progress Notes (Signed)
Patient is postop day 4 status post irrigation debridement bilateral knees.  Wound vacs were removed 2 days ago and an order was placed for daily dressing changes.   Patient is alert and awake wife at bedside patient states the dressings were not changed yesterday.  Right knee dressing is completely dry without any drainage.  Left knee dressing has old drainage on the posterior aspect.  I did take down his dressing in place new dressing.  There is just a small amount of bloody drainage from the central portion of the wound without any wound dehiscence.  All the rest of the blood on the dressings are dry.  Spoke with nursing to change dressing on right side and to begin daily dressing changes with 4 x 4's and Ace wraps.  Spoke with Dr. Sharol Given and it is fine to go forward and restart Eliquis.  We will continue to monitor as needed for any increased drainage

## 2020-05-06 NOTE — Progress Notes (Signed)
ANTICOAGULATION CONSULT NOTE - Initial Consult  Pharmacy Consult for Heparin Indication: atrial fibrillation  Allergies  Allergen Reactions  . Atorvastatin Other (See Comments)    Urinary retention   . Nebivolol Diarrhea    Patient Measurements: Height: 5\' 10"  (177.8 cm) Weight: 93.1 kg (205 lb 4 oz) IBW/kg (Calculated) : 73 Heparin Dosing Weight: 93.1 kg  Vital Signs: Temp: 98.3 F (36.8 C) (06/16 0749) Temp Source: Oral (06/16 0749) BP: 107/67 (06/16 0749) Pulse Rate: 97 (06/16 0749)  Labs: Recent Labs    05/04/20 0600 05/04/20 0600 05/04/20 1714 05/04/20 1714 05/05/20 0403 05/06/20 0500  HGB 7.5*   < > 6.8*   < > 8.1* 7.7*  HCT 22.7*   < > 20.5*  --  24.5* 24.3*  PLT 252   < > 231  --  248 293  CREATININE 1.38*  --   --   --  1.23  --    < > = values in this interval not displayed.    Estimated Creatinine Clearance: 66.8 mL/min (by C-G formula based on SCr of 1.23 mg/dL).   Medical History: Past Medical History:  Diagnosis Date  . Abnormal LFTs (liver function tests) 10/31/2016  . Abnormal nuclear stress test 05/03/2018  . Acid reflux 02/15/2018   not at present  . Acute otitis externa of left ear 11/13/2019  . Acute suppurative otitis media of left ear without spontaneous rupture of tympanic membrane 11/13/2019  . Anemia    low iron  . Aortic stenosis 05/03/2018  . Aortic valve abscess 04/13/2020  . ARF (acute renal failure) ([place]) 03/20/2020  . Arthritis 02/15/2018   Overview:  shoulder  . Bacteremia due to Enterococcus 03/21/2020  . Cervical myelopathy ([place]) 04/22/2019  . Conductive hearing loss of left ear 11/13/2019  . Coronary artery disease involving native coronary artery of native heart without angina pectoris 05/30/2016  . Coronary artery disease of native artery of native heart with stable angina pectoris ([place])    pt. denies  . Diabetes mellitus due to underlying condition with unspecified complications ([place]) 8/65/7846  . Discitis 04/07/2020  .  Dyslipidemia 05/30/2016  . Dyspnea    upon exertion,  as of 06/05/09 not having any recent chest pain  . Dysrhythmia    a fib  . Endocarditis 04/13/2020  . Essential hypertension 05/30/2016  . Heart murmur    Has had an Aortic valve replacement  . History of fusion of cervical spine 03/23/2020  . Hyperactive gag reflex 02/15/2018  . Hypothyroidism   . Intermittent claudication ([place]) 10/31/2018  . Lumbar stenosis with neurogenic claudication 06/11/2019  . Macular degeneration 02/15/2018  . PAF (paroxysmal atrial fibrillation) ([place]) 05/30/2016  . Pre-operative cardiovascular examination 01/03/2020  . Preoperative cardiovascular examination 04/17/2019  . Prosthetic valve endocarditis ([place]) 04/13/2020  . Protein-calorie malnutrition, severe 03/24/2020  . Pseudogout of left knee 03/21/2020  . Renal insufficiency 01/17/2020  . S/P aortic valve replacement 01/17/2020  . S/P AVR 01/17/2020  . Septic arthritis ([place]) 03/23/2020  . Tinnitus of left ear 11/13/2019  . Typical atrial flutter [facility])    Assessment:  69 yr old male on Eliquis 5 mg BID PTA for atrial fibrillation.  Eliquis has been on hold since 6/12 due to surgery for septic arthritis/ recurrent effusions of both knees. Significant bleeding from left knee post-op and anticoagulation remained on hold. Has had 5 units PRBCs 6/12>>6/14. Bleeding noted essentially resolved and to resume anticoagulation with IV heparin today, If no recurrent bleeding, Eliquis to resume  on 6/17. Hgb 7.7 today, platelet count normal.    Plan no heparin boluses due to recent bleeding.  Last Eliquis dose 6/12, so do not expect any significant effect on heparin level, but will monitor with aPTT and heparin level for now.  Will target low-therapeutic levels.    Goal of Therapy:  Heparin level 0.3-0.5 units/ml aPTT 66-85 seconds Monitor platelets by anticoagulation protocol: Yes   Plan:   Heparin drip at 1100 units/hr (~12 units/kg/hr)  Heparin level and aPTT ~8 hrs after drip  begins.  CBC in am.  Monitor for any bleeding.  Planning to resume Eliquis on 6/17 if no recurrent bleeding.  Arty Baumgartner, [place] Phone: [phone] 05/06/2020,11:00 AM

## 2020-05-06 NOTE — Progress Notes (Signed)
Inpatient Rehab Admissions:  Inpatient Rehab Consult received.  I spoke with the patient's wife for rehabilitation assessment and to discuss goals and expectations of an inpatient rehab admission.  They are hopeful to return to [facility] pending insurance authorization.  I've opened the case and will update once I have a determination.   Signed: Shann Medal, PT, DPT Admissions Coordinator [phone] 05/06/20  9:56 AM

## 2020-05-06 NOTE — Progress Notes (Signed)
ANTICOAGULATION CONSULT NOTE - Follow Up Consult  Pharmacy Consult for Heparin Indication: atrial fibrillation  Allergies  Allergen Reactions  . Atorvastatin Other (See Comments)    Urinary retention   . Nebivolol Diarrhea    Patient Measurements: Height: 5\' 10"  (177.8 cm) Weight: 93.1 kg (205 lb 4 oz) IBW/kg (Calculated) : 73 Heparin Dosing Weight:    Vital Signs: Temp: 98.6 F (37 C) (06/16 2009) Temp Source: Oral (06/16 2009) BP: 105/81 (06/16 2009) Pulse Rate: 98 (06/16 2009)  Labs: Recent Labs    05/04/20 0600 05/04/20 0600 05/04/20 1714 05/04/20 1714 05/05/20 0403 05/06/20 0500 05/06/20 2044  HGB 7.5*   < > 6.8*   < > 8.1* 7.7*  --   HCT 22.7*   < > 20.5*  --  24.5* 24.3*  --   PLT 252   < > 231  --  248 293  --   APTT  --   --   --   --   --   --  49*  HEPARINUNFRC  --   --   --   --   --   --  0.56  CREATININE 1.38*  --   --   --  1.23  --   --    < > = values in this interval not displayed.    Estimated Creatinine Clearance: 66.8 mL/min (by C-G formula based on SCr of 1.23 mg/dL).   Assessment:  Anticoag: Heparin for afib, on Eliquis PTA, LD 6/12 at 1508, held for OR and then for bleeding > 6/16 begin IV heparin and chg back to Eliquis on 6/17 if no bleeding - PM HL 0.56  Goal of Therapy:  Heparin level 0.3-0.5 units/ml Monitor platelets by anticoagulation protocol: Yes   Plan:  Con't IV heparin at 1100 units/hr Next HL with AM labs. Possibly back to Eliquis tomorrow if bleeding resolved.   Berkley Wrightsman S. Alford Highland, PharmD, BCPS Clinical Staff Pharmacist [email or web address] Alford Highland, [place] 05/06/2020,9:24 PM

## 2020-05-06 NOTE — Progress Notes (Signed)
Progress Note  Patient Name: Johnathan Murray Date of Encounter: 05/06/2020  [facility] Cardiologist: Jenean Lindau, MD   Subjective   Mildly tachycardic when trying to get up.  Otherwise stable.  Inpatient Medications    Scheduled Meds: . amiodarone  400 mg Oral BID  . [START ON 05/21/2020] amoxicillin  500 mg Oral Q12H  . Chlorhexidine Gluconate Cloth  6 each Topical Daily  . feeding supplement (ENSURE ENLIVE)  237 mL Oral TID BM  . ferrous [id number] C-folic acid  1 capsule Oral Q breakfast  . insulin aspart  0-20 Units Subcutaneous TID WC  . insulin aspart  0-5 Units Subcutaneous QHS  . insulin glargine  10 Units Subcutaneous BID  . levothyroxine  25 mcg Oral QAC breakfast  . metoprolol tartrate  12.5 mg Oral BID  . oxyCODONE  10 mg Oral Q12H  . polyethylene glycol  17 g Oral Daily  . pregabalin  100 mg Oral TID  . senna  1 tablet Oral QHS  . sodium chloride flush  10-40 mL Intracatheter Q12H  . sodium chloride flush  3 mL Intravenous Q12H   Continuous Infusions: . ampicillin (OMNIPEN) IV 2 g (05/06/20 0920)  . cefTRIAXone (ROCEPHIN) IVPB 2 gram/100 mL NS (Mini-Bag Plus) 2 g (05/06/20 0454)  . heparin 1,100 Units/hr (05/06/20 1058)   PRN Meds: acetaminophen **OR** acetaminophen, albuterol, cyclobenzaprine, oxyCODONE, sodium chloride flush   Vital Signs    Vitals:   05/06/20 0012 05/06/20 0331 05/06/20 0749 05/06/20 1000  BP: (!) 100/55 99/60 107/67   Pulse: 90 89 97   Resp: 20 16 16    Temp: 98 F (36.7 C) 98.3 F (36.8 C) 98.3 F (36.8 C)   TempSrc: Oral Axillary Oral   SpO2: 94% 99% 97%   Weight:      Height:    5\' 10"  (1.778 m)    Intake/Output Summary (Last 24 hours) at 05/06/2020 1106 Last data filed at 05/06/2020 0504 Gross per 24 hour  Intake 804.93 ml  Output 2600 ml  Net -1795.07 ml   Last 3 Weights 05/04/2020 05/03/2020 05/02/2020  Weight (lbs) 205 lb 4 oz 195 lb 8.8 oz 173 lb 15.1 oz  Weight (kg) 93.1 kg 88.7 kg 78.9 kg       Telemetry    Atrial fibrillation heart rate less than 100 on average- Personally Reviewed  ECG    No new- Personally Reviewed  Physical Exam   GEN: No acute distress.   Neck: No JVD Cardiac:  Irregularly irregular soft systolic murmur,no rubs, or gallops.  Respiratory: Clear to auscultation bilaterally. GI: Soft, nontender, non-distended  MS: No edema; No deformity.  Legs wrapped Neuro:  Nonfocal  Psych: Normal affect   Labs    High Sensitivity Troponin:  No results for input(s): TROPONINIHS in the last 720 hours.    Chemistry Recent Labs  Lab 05/01/20 0420 05/01/20 0420 05/03/20 0845 05/04/20 0600 05/05/20 0403  NA 135   < > 134* 133* 136  K 4.0   < > 4.6 4.4 4.1  CL 102   < > 102 97* 100  CO2 27   < > 24 27 26   GLUCOSE 102*   < > 269* 200* 172*  BUN 15   < > 25* 24* 20  CREATININE 1.25*   < > 1.44* 1.38* 1.23  CALCIUM 8.7*   < > 8.3* 8.1* 8.3*  PROT 5.7*  --   --   --  4.7*  ALBUMIN 2.0*  --   --   --  1.8*  AST 44*  --   --   --  25  ALT 58*  --   --   --  41  ALKPHOS 126  --   --   --  75  BILITOT 0.8  --   --   --  0.5  GFRNONAA 59*   < > 50* 53* >60  GFRAA >60   < > 58* >60 >60  ANIONGAP 6   < > 8 9 10    < > = values in this interval not displayed.     Hematology Recent Labs  Lab 05/04/20 1714 05/05/20 0403 05/06/20 0500  WBC 10.2 9.4 9.2  RBC 2.40* 2.80* 2.69*  HGB 6.8* 8.1* 7.7*  HCT 20.5* 24.5* 24.3*  MCV 85.4 87.5 90.3  MCH 28.3 28.9 28.6  MCHC 33.2 33.1 31.7  RDW 19.6* 18.4* 18.6*  PLT 231 248 293    BNPNo results for input(s): BNP, PROBNP in the last 168 hours.   DDimer No results for input(s): DDIMER in the last 168 hours.   Radiology    No results found.    69 year old with the following issues:  Aortic root repair/aortic valve replacement -Stable, appreciate Dr. Cyndia Bent  Persistent atrial fibrillation -Okay with IV heparin for today, transition to Eliquis tomorrow.  Watch for stability of hemoglobin.  Explained to  the family risk versus benefit.  Severe anemia -Multiple transfusions.     For questions or updates, please contact [place] Please consult [email or web address] for contact info under        Signed, Candee Furbish, MD  05/06/2020, 11:06 AM

## 2020-05-07 LAB — CBC
HCT: 24.2 % — ABNORMAL LOW (ref 39.0–52.0)
Hemoglobin: 7.5 g/dL — ABNORMAL LOW (ref 13.0–17.0)
MCH: 28.5 pg (ref 26.0–34.0)
MCHC: 31 g/dL (ref 30.0–36.0)
MCV: 92 fL (ref 80.0–100.0)
Platelets: 308 10*3/uL (ref 150–400)
RBC: 2.63 MIL/uL — ABNORMAL LOW (ref 4.22–5.81)
RDW: 18.4 % — ABNORMAL HIGH (ref 11.5–15.5)
WBC: 8.4 10*3/uL (ref 4.0–10.5)
nRBC: 0 % (ref 0.0–0.2)

## 2020-05-07 LAB — GLUCOSE, CAPILLARY
Glucose-Capillary: 300 mg/dL — ABNORMAL HIGH (ref 70–99)
Glucose-Capillary: 207 mg/dL — ABNORMAL HIGH (ref 70–99)
Glucose-Capillary: 173 mg/dL — ABNORMAL HIGH (ref 70–99)
Glucose-Capillary: 131 mg/dL — ABNORMAL HIGH (ref 70–99)
Glucose-Capillary: 200 mg/dL — ABNORMAL HIGH (ref 70–99)
Glucose-Capillary: 107 mg/dL — ABNORMAL HIGH (ref 70–99)
Glucose-Capillary: 98 mg/dL (ref 70–99)
Glucose-Capillary: 161 mg/dL — ABNORMAL HIGH (ref 70–99)
Glucose-Capillary: 137 mg/dL — ABNORMAL HIGH (ref 70–99)
Glucose-Capillary: 150 mg/dL — ABNORMAL HIGH (ref 70–99)

## 2020-05-07 LAB — AEROBIC/ANAEROBIC CULTURE W GRAM STAIN (SURGICAL/DEEP WOUND)
Culture: NO GROWTH
Culture: NO GROWTH

## 2020-05-07 LAB — HEPARIN LEVEL (UNFRACTIONATED): Heparin Unfractionated: 0.56 IU/mL (ref 0.30–0.70)

## 2020-05-07 MED ORDER — APIXABAN 5 MG PO TABS
5.0000 mg | ORAL_TABLET | Freq: Two times a day (BID) | ORAL | Status: DC
  Administered 2020-05-07 – 2020-05-09 (×5): 5 mg via ORAL
  Filled 2020-05-07 (×5): qty 1

## 2020-05-07 MED ORDER — SODIUM CHLORIDE 0.9 % IV BOLUS
500.0000 mL | Freq: Once | INTRAVENOUS | Status: AC
  Administered 2020-05-07: 500 mL via INTRAVENOUS

## 2020-05-07 NOTE — Progress Notes (Signed)
PROGRESS NOTE        PATIENT DETAILS Name: Johnathan Murray Age: 69 y.o. Sex: male Date of Birth: 1950/11/30 Admit Date: 05/02/2020 Admitting Physician Norval Morton, MD [id number], [name], MD  Brief Narrative: Patient is a 69 y.o. 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  Patient was seen by cardiothoracic surgery-underwent debridement of aortic root abscess/aortic valve replacement with bioprosthetic valve-ID was consulted-patient was subsequently discharged to CIR on 6/10.  While at CIR-patient started having gradually worsening anemia, bilateral knee pain/swelling-arthrocentesis was suggestive of septic knee-patient subsequently underwent irrigation/debridement and total synovectomy of bilateral knees-postoperatively-patient was noted to have A. fib with RVR and severe anemia-and readmitted to the hospitalist service.  See below for further details.  Significant events: 4/29-5/18>> admit by [facility] for sepsis secondary to Enterococcus faecalis, found to have acute osteo/discitis of L/-/5, left knee septic arthritis-discharge to CIR  5/18-5/24>> at CIR-aortic root abscess/prosthetic aortic valve vegetation on TEE (done for cardioversion) 5/24-6/10>> admit by [facility] from CIR-for aortic root replacement-discharged back to CIR. 6/10-6/12>> at CIR- recurrent bilateral knee septic arthritis on arthrocentesis-ortho reconsulted 6/12-current>> admit to [facility] for A. fib with RVR, severe anemia following bilateral knee washout for  septic arthritis.  Significant postoperative bleeding from left knee.  Significant studies: 4/29>> CT abdomen/pelvis: No acute intra-abdominal or pelvic pathology 4/29>> CT C-spine: Interval removal of fixating plate/screws from C4-C6, no acute osseous abnormality 4/30>>left lower extremity Doppler: No DVT, ruptured Baker's cyst 5/3>> MRI C-spine: No evidence of infection, s/p C3-4 discectomy/fusion. 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  Procedures : 6/12>> irrigation/debridement with total synovectomy of bilateral knees 6/9>> TEE guided cardioversion 5/27>>S/p Redo median sternotomy, rightaxillaryartery graftfor cardiopulmonary bypass, extracorporeal circulation, debridement of aortic root abscess and pseudoaneurysm, and aortic root replacement using a 38mm Homograft aortic root with reimplantation of coronary arteries. 5/24>> TEE guided cardioversion.  Found to have vegetation on prosthetic aortic valve on TEE. 5/18>> fluoroscopy guided needle aspiration of L4-L5 disc space 5/4>> B/L knee irrigation irrigation debridement arthroscopic. B/L knee medial and lateral menisectomies.  Antimicrobial therapy: Ampicillin: 5/18>> Rocephin: 5/19>>  Microbiology data: 6/12>> knee culture: No growth 6/10>> left knee synovial fluid culture: No growth  5/27>> aortic valve tissue culture: No  growth 5/26>> blood culture: No growth 5/22>> urine culture: No growth 5/19>> left synovitic fluid culture: No growth 5/18>> L4-L5 disc aspirate: Enterococcus faecalis 5/6>> blood culture: No growth 5/3>> blood culture: No growth 5/2>> right knee synovial fluid: No growth 5/1>> left knee synovial fluid: Enterococcus faecalis 4/30>> blood culture: Enterococcus faecalis 4/29>> urine  culture: No growth  Consults: ID [facility] Cardiology Ortho  DVT Prophylaxis : SCD's  Subjective: Had significant pain when he attempted to ambulate with PT yesterday-wants to try with IV narcotics.  Dressing in left knee dry-Per nursing staff-no bleeding.  Assessment/Plan: Hemorrhagic shock 2/2 to post op bleeding from left knee with acute blood loss anemia superimposed on anemia of chronic disease: Shock physiology has resolved-transfused a total of 5 units of PRBC this admission.  Left knee bleeding seems to have essentially resolved.Evaluated by Ortho on 6/16-okay to start anticoagulation-started on IV heparin-since no bleeding-we will transition to Eliquis on 6/17.  A. fib with RVR: Rate controlled but remains in atrial fibrillation-continue amiodarone and low-dose beta-blocker.  Eliquis to be started on 6/17.  Cardiology following.    Enterococcus bacteremia-with L4-L5 discitis, bilateral septic knee, prosthetic valve vegetation with aortic root abscess: Evaluated by infectious disease-recommendations are to continue ceftriaxone/ampicillin till 05/20/2020-and then to start p.o. amoxicillin 500 mg twice daily from 05/21/2020.  Appointment made with Dr. Megan Salon on 06/03/2020 at 2:45 PM.    CAD-s/p CABG-no anginal symptoms-antiplatelets/anticoagulation on hold.  Hypothyroidism: Continue levothyroxine  DM-2 with uncontrolled hyperglycemia: CBGs relatively stable-continue Lantus 10 units twice daily, SSI.  Follow and adjust.     Recent Labs    05/06/20 2119 05/07/20 0746 05/07/20 1304  GLUCAP 109* 98  161*   Debility/deconditioning: Secondary to acute illness-we will need PT/OT-plans are to discharge back to CIR.   Diet: Diet Order            Diet Carb Modified Fluid consistency: Thin; Room service appropriate? Yes  Diet effective now                Code Status: Full code   Family Communication: Spouse at bedside  Disposition Plan: Status is: Inpatient  Remains inpatient appropriate because:Inpatient level of care appropriate due to severity of illness  Dispo: The patient is from: Home              Anticipated d/c is to: CIR              Anticipated d/c date is: > 1 days              Patient currently is  medically stable to d/c.   Barriers to Discharge: Awaiting CIR bed/insurance authorization  Antimicrobial agents: Anti-infectives (From admission, onward)   Start     Dose/Rate Route Frequency Ordered Stop   05/21/20 1000  amoxicillin (AMOXIL) capsule 500 mg     Discontinue     500 mg Oral Every 12 hours 05/06/20 1103     05/21/20 0000  amoxicillin (AMOXIL) 500 MG capsule     Discontinue    Note to Pharmacy: Put on file. Patient going to CIR prior to discharge   500 mg Oral 2 times daily 05/06/20 1109 09/18/20 2359   05/03/20 0500  cefTRIAXone (ROCEPHIN) 2 g in sodium chloride 0.9 % 100 mL IVPB     Discontinue     2 g 200 mL/hr over 30 Minutes Intravenous Every 12 hours 05/02/20 2155 05/20/20 2359   05/02/20 2245  cefTRIAXone (ROCEPHIN) 2 g in sodium chloride 0.9 % 100 mL IVPB  Status:  Discontinued        2 g 200 mL/hr over 30 Minutes Intravenous Every 12 hours 05/02/20 1943 05/02/20 2155   05/02/20 1800  ampicillin (OMNIPEN) 2 g in sodium chloride 0.9 % 100 mL IVPB     Discontinue     2  g 300 mL/hr over 20 Minutes Intravenous Every 4 hours 05/02/20 1745 05/20/20 2359       Time spent: 25 minutes-Greater than 50% of this time was spent in counseling, explanation of diagnosis, planning of further management, and coordination of  care.  MEDICATIONS: Scheduled Meds: . amiodarone  400 mg Oral BID  . [START ON 05/21/2020] amoxicillin  500 mg Oral Q12H  . apixaban  5 mg Oral BID  . Chlorhexidine Gluconate Cloth  6 each Topical Daily  . feeding supplement (ENSURE ENLIVE)  237 mL Oral TID BM  . ferrous [id number] C-folic acid  1 capsule Oral Q breakfast  . insulin aspart  0-20 Units Subcutaneous TID WC  . insulin aspart  0-5 Units Subcutaneous QHS  . insulin glargine  10 Units Subcutaneous BID  . levothyroxine  25 mcg Oral QAC breakfast  . metoprolol tartrate  12.5 mg Oral BID  . oxyCODONE  10 mg Oral Q12H  . polyethylene glycol  17 g Oral Daily  . pregabalin  100 mg Oral TID  . senna  1 tablet Oral QHS  . sodium chloride flush  10-40 mL Intracatheter Q12H  . sodium chloride flush  3 mL Intravenous Q12H   Continuous Infusions: . ampicillin (OMNIPEN) IV 2 g (05/07/20 0829)  . cefTRIAXone (ROCEPHIN) IVPB 2 gram/100 mL NS (Mini-Bag Plus) 2 g (05/07/20 0535)   PRN Meds:.acetaminophen **OR** acetaminophen, albuterol, cyclobenzaprine, morphine injection, oxyCODONE, sodium chloride flush   PHYSICAL EXAM: Vital signs: Vitals:   05/07/20 0318 05/07/20 0540 05/07/20 0750 05/07/20 1255  BP: 116/75  (!) 126/114 111/86  Pulse: 83 93 99 83  Resp: 15 10 20  (!) 22  Temp: 98.3 F (36.8 C)  99.2 F (37.3 C) 98.8 F (37.1 C)  TempSrc: Oral  Oral Oral  SpO2: 96% 95% 95% 100%  Weight:  92.6 kg    Height:       Filed Weights   05/03/20 0405 05/04/20 0441 05/07/20 0540  Weight: 88.7 kg 93.1 kg 92.6 kg   Body mass index is 29.29 kg/m.   Gen Exam:Alert awake-not in any distress HEENT:atraumatic, normocephalic Chest: B/L clear to auscultation anteriorly CVS:S1S2 regular Abdomen:soft non tender, non distended Extremities:no edema Neurology: Non focal Skin: no rash  I have personally reviewed following labs and imaging studies  LABORATORY DATA: CBC: Recent Labs  Lab 05/01/20 0420 05/02/20 2252  05/04/20 0600 05/04/20 1714 05/05/20 0403 05/06/20 0500 05/07/20 0430  WBC 6.8   < > 10.8* 10.2 9.4 9.2 8.4  NEUTROABS 4.8  --   --   --   --   --   --   HGB 7.4*   < > 7.5* 6.8* 8.1* 7.7* 7.5*  HCT 24.7*   < > 22.7* 20.5* 24.5* 24.3* 24.2*  MCV 96.5   < > 84.4 85.4 87.5 90.3 92.0  PLT 283   < > 252 231 248 293 308   < > = values in this interval not displayed.    Basic Metabolic Panel: Recent Labs  Lab 05/01/20 0420 05/03/20 0845 05/04/20 0600 05/05/20 0403  NA 135 134* 133* 136  K 4.0 4.6 4.4 4.1  CL 102 102 97* 100  CO2 27 24 27 26   GLUCOSE 102* 269* 200* 172*  BUN 15 25* 24* 20  CREATININE 1.25* 1.44* 1.38* 1.23  CALCIUM 8.7* 8.3* 8.1* 8.3*  MG  --  1.9  --   --     GFR: Estimated Creatinine Clearance: 66.6 mL/min (by C-G formula  based on SCr of 1.23 mg/dL).  Liver Function Tests: Recent Labs  Lab 05/01/20 0420 05/05/20 0403  AST 44* 25  ALT 58* 41  ALKPHOS 126 75  BILITOT 0.8 0.5  PROT 5.7* 4.7*  ALBUMIN 2.0* 1.8*   No results for input(s): LIPASE, AMYLASE in the last 168 hours. No results for input(s): AMMONIA in the last 168 hours.  Coagulation Profile: No results for input(s): INR, PROTIME in the last 168 hours.  Cardiac Enzymes: No results for input(s): CKTOTAL, CKMB, CKMBINDEX, TROPONINI in the last 168 hours.  BNP (last 3 results) No results for input(s): PROBNP in the last 8760 hours.  Lipid Profile: No results for input(s): CHOL, HDL, LDLCALC, TRIG, CHOLHDL, LDLDIRECT in the last 72 hours.  Thyroid Function Tests: No results for input(s): TSH, T4TOTAL, FREET4, T3FREE, THYROIDAB in the last 72 hours.  Anemia Panel: No results for input(s): VITAMINB12, FOLATE, FERRITIN, TIBC, IRON, RETICCTPCT in the last 72 hours.  Urine analysis:    Component Value Date/Time   COLORURINE YELLOW 04/11/2020 2245   APPEARANCEUR CLEAR 04/11/2020 2245   LABSPEC 1.009 04/11/2020 2245   PHURINE 7.0 04/11/2020 2245   GLUCOSEU NEGATIVE 04/11/2020 2245    HGBUR NEGATIVE 04/11/2020 2245   BILIRUBINUR NEGATIVE 04/11/2020 2245   KETONESUR NEGATIVE 04/11/2020 2245   PROTEINUR NEGATIVE 04/11/2020 2245   NITRITE NEGATIVE 04/11/2020 2245   LEUKOCYTESUR NEGATIVE 04/11/2020 2245    Sepsis Labs: Lactic Acid, Venous    Component Value Date/Time   LATICACIDVEN 1.0 03/20/2020 1517    MICROBIOLOGY: Recent Results (from the past 240 hour(s))  Body fluid culture     Status: None   Collection Time: 04/30/20  5:28 PM   Specimen: Synovium; Body Fluid  Result Value Ref Range Status   Specimen Description SYNOVIAL LEFT KNEE  Final   Special Requests NONE  Final   Gram Stain   Final    ABUNDANT WBC PRESENT, PREDOMINANTLY PMN NO ORGANISMS SEEN    Culture   Final    NO GROWTH Performed at [facility], [address] [place], [place] [address]    Report Status 05/04/2020 FINAL  Final  Aerobic/Anaerobic Culture (surgical/deep wound)     Status: None   Collection Time: 05/02/20  8:55 AM   Specimen: Soft Tissue, Other  Result Value Ref Range Status   Specimen Description TISSUE  Final   Special Requests LEFT KNEE TISSUE SPEC A  Final   Gram Stain   Final    ABUNDANT WBC PRESENT, PREDOMINANTLY PMN NO ORGANISMS SEEN    Culture   Final    No growth aerobically or anaerobically. Performed at [facility], [place] [address] [place], [place] [address]    Report Status 05/07/2020 FINAL  Final  Aerobic/Anaerobic Culture (surgical/deep wound)     Status: None   Collection Time: 05/02/20  8:58 AM   Specimen: Soft Tissue, Other  Result Value Ref Range Status   Specimen Description TISSUE  Final   Special Requests RIGHT KNEE TISSUE SPEC B  Final   Gram Stain   Final    RARE WBC PRESENT,BOTH PMN AND MONONUCLEAR NO ORGANISMS SEEN    Culture   Final    No growth aerobically or anaerobically. Performed at [facility], [place] [address] [place],  [address]    Report Status 05/07/2020 FINAL  Final    RADIOLOGY  STUDIES/RESULTS: No results found.   LOS: 5 days   Oren Binet, MD  [facility]  To contact the attending provider between 7A-7P or the covering provider during after hours 7P-7A, please log into the web site [email or web address] and access using universal [place] password for that web site. If you do not have the password, please call the hospital operator.  05/07/2020, 2:44 PM

## 2020-05-07 NOTE — Progress Notes (Addendum)
Progress Note  Patient Name: Johnathan Murray Date of Encounter: 05/07/2020  Primary Cardiologist: Jenean Lindau, MD   Subjective   Feeling okay. Eager to get back to rehab. He reports awareness of his atrial fibrillation, though not terribly bothersome. No complaints of chest pain or SOB.   Inpatient Medications    Scheduled Meds: . amiodarone  400 mg Oral BID  . [START ON 05/21/2020] amoxicillin  500 mg Oral Q12H  . apixaban  5 mg Oral BID  . Chlorhexidine Gluconate Cloth  6 each Topical Daily  . feeding supplement (ENSURE ENLIVE)  237 mL Oral TID BM  . ferrous [id number] C-folic acid  1 capsule Oral Q breakfast  . insulin aspart  0-20 Units Subcutaneous TID WC  . insulin aspart  0-5 Units Subcutaneous QHS  . insulin glargine  10 Units Subcutaneous BID  . levothyroxine  25 mcg Oral QAC breakfast  . metoprolol tartrate  12.5 mg Oral BID  . oxyCODONE  10 mg Oral Q12H  . polyethylene glycol  17 g Oral Daily  . pregabalin  100 mg Oral TID  . senna  1 tablet Oral QHS  . sodium chloride flush  10-40 mL Intracatheter Q12H  . sodium chloride flush  3 mL Intravenous Q12H   Continuous Infusions: . ampicillin (OMNIPEN) IV 2 g (05/07/20 0829)  . cefTRIAXone (ROCEPHIN) IVPB 2 gram/100 mL NS (Mini-Bag Plus) 2 g (05/07/20 0535)  . heparin 1,100 Units/hr (05/06/20 1058)   PRN Meds: acetaminophen **OR** acetaminophen, albuterol, cyclobenzaprine, morphine injection, oxyCODONE, sodium chloride flush   Vital Signs    Vitals:   05/06/20 2332 05/07/20 0318 05/07/20 0540 05/07/20 0750  BP: 99/63 116/75  (!) 126/114  Pulse: 86 83 93 99  Resp: 15 15 10 20   Temp: 98.3 F (36.8 C) 98.3 F (36.8 C)  99.2 F (37.3 C)  TempSrc: Oral Oral  Oral  SpO2: 98% 96% 95% 95%  Weight:   92.6 kg   Height:        Intake/Output Summary (Last 24 hours) at 05/07/2020 0912 Last data filed at 05/07/2020 0829 Gross per 24 hour  Intake 1737.49 ml  Output 2450 ml  Net -712.51 ml    Filed Weights   05/03/20 0405 05/04/20 0441 05/07/20 0540  Weight: 88.7 kg 93.1 kg 92.6 kg    Telemetry    Atrial fibrillation with HR predominately in the 80s, though up to 100s with activity. - Personally Reviewed  ECG    No new tracings - Personally Reviewed  Physical Exam   GEN: No acute distress.   Neck: No JVD, no carotid bruits Cardiac: IRIR, no murmurs, rubs, or gallops.  Respiratory: Clear to auscultation bilaterally, no wheezes/ rales/ rhonchi GI: NABS, Soft, nontender, non-distended  MS: 1-2+ LE edema; knees with elastic wraps, no deformity. Neuro:  Nonfocal, moving all extremities spontaneously Psych: Normal affect   Labs    Chemistry Recent Labs  Lab 05/01/20 0420 05/01/20 0420 05/03/20 0845 05/04/20 0600 05/05/20 0403  NA 135   < > 134* 133* 136  K 4.0   < > 4.6 4.4 4.1  CL 102   < > 102 97* 100  CO2 27   < > 24 27 26   GLUCOSE 102*   < > 269* 200* 172*  BUN 15   < > 25* 24* 20  CREATININE 1.25*   < > 1.44* 1.38* 1.23  CALCIUM 8.7*   < > 8.3* 8.1* 8.3*  PROT 5.7*  --   --   --  4.7*  ALBUMIN 2.0*  --   --   --  1.8*  AST 44*  --   --   --  25  ALT 58*  --   --   --  41  ALKPHOS 126  --   --   --  75  BILITOT 0.8  --   --   --  0.5  GFRNONAA 59*   < > 50* 53* >60  GFRAA >60   < > 58* >60 >60  ANIONGAP 6   < > 8 9 10    < > = values in this interval not displayed.     Hematology Recent Labs  Lab 05/05/20 0403 05/06/20 0500 05/07/20 0430  WBC 9.4 9.2 8.4  RBC 2.80* 2.69* 2.63*  HGB 8.1* 7.7* 7.5*  HCT 24.5* 24.3* 24.2*  MCV 87.5 90.3 92.0  MCH 28.9 28.6 28.5  MCHC 33.1 31.7 31.0  RDW 18.4* 18.6* 18.4*  PLT 248 293 308    Cardiac EnzymesNo results for input(s): TROPONINI in the last 168 hours. No results for input(s): TROPIPOC in the last 168 hours.   BNPNo results for input(s): BNP, PROBNP in the last 168 hours.   DDimer No results for input(s): DDIMER in the last 168 hours.   Radiology    No results found.  Cardiac Studies    TEE 04/29/20: 1. Left ventricular ejection fraction, by estimation, is 55 to 60%. The  left ventricle has normal function. The left ventricle has no regional  wall motion abnormalities.  2. Right ventricular systolic function is mildly reduced. The right  ventricular size is normal.  3. No left atrial/left atrial appendage thrombus was detected. The LAA  emptying velocity was 50 cm/s.  4. Multiple regurgitant jets each with mild regurgitation. The mitral  valve is normal in structure. Mild to moderate mitral valve regurgitation.  No evidence of mitral stenosis.  5. Tricuspid valve regurgitation is mild to moderate.  6. The aortic valve has been repaired/replaced. Aortic valve  regurgitation is trivial. No aortic stenosis is present. There is a 23 mm  Homograft valve/aortic root valve present in the aortic position.  Procedure Date: 04/16/2020. Echo findings are  consistent with normal structure and function of the aortic valve  prosthesis.  7. The inferior vena cava is normal in size with greater than 50%  respiratory variability, suggesting right atrial pressure of 3 mmHg.  Patient Profile     69 y.o. male with a history of CAD s/p CABG in 2017 with most recent cath in 01/2020 showing 2/3 graft occluded, aortic stenosis s/p bioprosthetic AVR at time of CABG with recently diagnosed endocarditis of aortic valve on 04/13/2020 now s/p debridement of aortic root abscess and aortic root replacement on 04/16/2020, paroxysmal atrial fibrillation on Eliquis, recurrent bilateral knee effusions, hypertension, dyslipidemia, diabetes on insulin, hypothyroidism, lumbar stenosis with neurogenic claudication, and cervical decompression in 01/2020 who is being followed by cardiology for the evaluation of atrial fibrillation.  Assessment & Plan    1. Persistent atrial fibrillation: patient was found to have Afib with RVR in the setting of bilateral septic knee's, now s/p irrigation/debridement and  total synovectomy. ID following for management of IV antibiotics. Hgb stable and patient started on eliquis this morning. Soft blood pressures limit titration of AV nodal blocking agents. Hrs stable on amiodarone and metoprolol tartrate. - Continue amiodarone 400mg  BID x1 weeks total with plans to taper to 200mg  BID x2 weeks, then 200mg  daily going forward.  - Continue  metoprolol tartrate 12.5mg  BID for rate control - Continue eliquis 5mg  BID for stroke ppx - Could consider repeat DCCV in the future if atrial fibrillation persists and infection improves.   2. AS s/p bioprosthetic AVR/aortic root repair: initially underwent AVR in 2017. Recently found to have endocarditis of aortic valve 04/13/20 and underwent debridement of aortic root abscess and aortic root replacement 04/16/20. On IV antibiotics per ID.  - Continue close monitoring per Dr. Cyndia Bent  3. CAD s/p CABG in 2017: no anginal complaints. No on aspirin given need for anticoagulation - Continue zetia and metoprolol tartrate  4. HTN: BP is soft, likely 2/2 recent infection. Home amlodipine, benazepril and lasix are on hold - Continue metoprolol tartrate for now. - Re-introduce medications as needed.   [facility] will sign off.   Medication Recommendations:  As above Other recommendations (labs, testing, etc):  None Follow up as an outpatient:  Will arrange outpatient follow-up in 2-3 weeks.      For questions or updates, please contact [place] Please consult [email or web address] for contact info under Cardiology/STEMI.      Signed, Abigail Butts, PA-C  05/07/2020, 9:12 AM   [phone]  Personally seen and examined. Agree with above.   Back on Eliquis.  Stable hemoglobin.  Continue with PT and exercise.  Atrial fibrillation under good rate control currently.  Exam irregular irregular heart.  Knees remained Ace wrapped.  Lungs are clear.  Discussed with he and his wife.  We will see on as-needed basis.  Please let  us know if we can be of further assistance.  Candee Furbish, MD

## 2020-05-07 NOTE — Progress Notes (Signed)
Physical Therapy Treatment Patient Details Name: Johnathan Murray MRN: [id number] DOB: Feb 23, 1951 Today's Date: 05/07/2020    History of Present Illness Patient is a 69 y.o. 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  Patient was seen by cardiothoracic surgery-underwent debridement of aortic root abscess/aortic valve replacement with bioprosthetic valve 04/17/20 via median sternotomy-ID was consulted-patient was subsequently discharged to [facility] on 6/10.  While at CIR-patient started having gradually worsening anemia, bilateral knee pain/swelling-arthrocentesis was suggestive of septic knee-patient subsequently underwent irrigation/debridement and total synovectomy of bilateral knees-post procedure-patient was noted to have A. fib with RVR and severe anemia-and readmitted to the hospitalist service.    PT Comments    Pt was interesting in participating as much as possible.  He states the break through meds helps him be able to participate, but pain is still up to as much as 10/10 in his back.  Emphasis on maintaining sternal precautions during the stand, but allowing for the use of the RW, transitions to EOB, scooting without UE's, sit to stand with minimal use of UE's, worked on pregait and stepping forward/back 3 feet x 2.    Follow Up Recommendations  [facility]     Equipment Recommendations  None recommended by PT    Recommendations for Other Services       Precautions / Restrictions Precautions Precautions: Sternal;Fall Spinal Brace: Lumbar  corset Other Brace: for comfort, pt reports "it doesn't really help me" so we didn't use back brace Restrictions Other Position/Activity Restrictions: sternal precautions    Mobility  Bed Mobility Overal bed mobility: Needs Assistance Bed Mobility: Rolling;Sidelying to Sit;Sit to Sidelying Rolling: Mod assist;+2 for physical assistance Sidelying to sit: Mod assist;+2 for physical assistance     Sit to sidelying: Mod assist;+2 for physical assistance General bed mobility comments: cued for technique,  assisted up via R elbow with truncal assist  Transfers Overall transfer level: Needs assistance Equipment used: Rolling walker (2 wheeled);None Transfers: Sit to/from Stand Sit to Stand: Mod assist;+2 physical assistance        Lateral/Scoot Transfers: Mod assist General transfer comment: Stress on Sternal precautions "in the tube".  Assist to help pt come forward and boost with control x3 in the to RW.  Ambulation/Gait Ambulation/Gait assistance: Min assist;+2 physical assistance;Mod assist (more assist with increased pain and fatigue) Gait Distance (Feet): 3 Feet (forward and back) Assistive device: Rolling walker (2 wheeled) Gait Pattern/deviations: Step-to pattern Gait velocity: decreased Gait velocity interpretation: <1.31 ft/sec, indicative of household ambulator General Gait Details: Mostly pre-gait activities including w/shift, unweighting, advancing feet, maneuvering the RW forward and back.  working on posture.   Stairs             Wheelchair Mobility    Modified Rankin (Stroke Patients Only)       Balance Overall balance assessment: Needs assistance   Sitting balance-Leahy Scale: Fair Sitting balance - Comments: Due to back pain sitting with support is difficult and for a short period only.   Standing balance support: Bilateral upper extremity supported;Single extremity supported;During functional activity Standing balance-Leahy Scale: Fair Standing  balance comment: pregait activities for >1-2 min x3.  Cognition Arousal/Alertness: Awake/alert Behavior During Therapy: WFL for tasks assessed/performed Overall Cognitive Status: Within Functional Limits for tasks assessed                                        Exercises Total Joint Exercises Heel Slides: AROM;AAROM;Both;10 reps;Supine;Strengthening (with graded resistance gross ext, stress on knee ext.) General Exercises - Lower Extremity Quad Sets: AROM;Both;Supine;5 reps    General Comments General comments (skin integrity, edema, etc.): vss      Pertinent Vitals/Pain Pain Assessment: 0-10 Pain Score: 10-Worst pain ever Pain Location: bil knee> back Pain Descriptors / Indicators: Grimacing;Guarding Pain Intervention(s): Monitored during session;Premedicated before session    Home Living                      Prior Function            PT Goals (current goals can now be found in the care plan section) Acute Rehab PT Goals PT Goal Formulation: With patient/family Time For Goal Achievement: 05/17/20 Potential to Achieve Goals: Good Progress towards PT goals: Progressing toward goals    Frequency    Min 4X/week      PT Plan Current plan remains appropriate    Co-evaluation              AM-PAC PT "6 Clicks" Mobility   Outcome Measure  Help needed turning from your back to your side while in a flat bed without using bedrails?: A Lot Help needed moving from lying on your back to sitting on the side of a flat bed without using bedrails?: A Lot Help needed moving to and from a bed to a chair (including a wheelchair)?: A Lot Help needed standing up from a chair using your arms (e.g., wheelchair or bedside chair)?: A Lot Help needed to walk in hospital room?: A Lot Help needed climbing 3-5 steps with a railing? : Total 6 Click Score: 11    End of Session         PT Visit Diagnosis: Other  abnormalities of gait and mobility (R26.89);Muscle weakness (generalized) (M62.81);Pain Pain - part of body:  (knees, back)     Time: 2919-1660 PT Time Calculation (min) (ACUTE ONLY): 33 min  Charges:  $Gait Training: 8-22 mins $Therapeutic Activity: 8-22 mins                     05/07/2020  Johnathan Carne., PT Acute Rehabilitation Services [phone]  (pager) [phone]  (office)   Johnathan Murray 05/07/2020, 5:48 PM

## 2020-05-07 NOTE — Progress Notes (Signed)
Patient mews score in yellow due to elevated heart rate and low blood pressure.Vital signs as follows temp. 98.2 heart rate 113 RR 14 oxygen saturations 97% on room air.2030 paged Dr. Claria Dice .[phone] New orders received.Will follow mews protocol.

## 2020-05-08 LAB — GLUCOSE, CAPILLARY
Glucose-Capillary: 151 mg/dL — ABNORMAL HIGH (ref 70–99)
Glucose-Capillary: 128 mg/dL — ABNORMAL HIGH (ref 70–99)
Glucose-Capillary: 127 mg/dL — ABNORMAL HIGH (ref 70–99)
Glucose-Capillary: 136 mg/dL — ABNORMAL HIGH (ref 70–99)

## 2020-05-08 MED ORDER — OXYCODONE HCL 5 MG PO TABS
5.0000 mg | ORAL_TABLET | Freq: Once | ORAL | Status: AC
  Administered 2020-05-08: 5 mg via ORAL
  Filled 2020-05-08: qty 1

## 2020-05-08 NOTE — Discharge Instructions (Addendum)

## 2020-05-08 NOTE — Progress Notes (Signed)
Later Progress Note  Pt spent a hour in the recliner working on sitting tolerance.    05/08/20 1925  PT Visit Information  Last PT Received On 05/08/20  Assistance Needed +2  History of Present Illness Patient is a 69 y.o. 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  Patient was seen by cardiothoracic surgery-underwent debridement of aortic root abscess/aortic valve replacement with bioprosthetic valve 04/17/20 via median sternotomy-ID was consulted-patient was subsequently discharged to [facility] on 6/10.  While at CIR-patient started having gradually worsening anemia, bilateral knee pain/swelling-arthrocentesis was suggestive of septic knee-patient subsequently underwent irrigation/debridement and total synovectomy of bilateral knees-post procedure-patient was noted to have A. fib with RVR and severe anemia-and readmitted to the hospitalist service.  Precautions  Precautions Sternal;Fall  Precaution Comments sternal  Required Braces or Orthoses Spinal Brace  Spinal Brace Lumbar corset  Other Brace for comfort, pt reports "it doesn't really help me" so we didn't use back brace  Restrictions  Other Position/Activity Restrictions sternal precautions  Pain Assessment  Pain Location back > Bil knees  Pain Descriptors / Indicators Grimacing;Guarding;Discomfort  Pain Intervention(s) Monitored during session  Cognition  Arousal/Alertness Awake/alert  Behavior During Therapy WFL for tasks assessed/performed  Overall Cognitive Status Within Functional Limits  for tasks assessed  Bed Mobility  Overal bed mobility Needs Assistance  Sit to sidelying Mod assist;+2 for physical assistance  General bed mobility comments cues for best technique to return to sidelying.  Assist at trunk and LE's  Transfers  Overall transfer level Needs assistance  Equipment used None  Transfers Sit to/from Stand;Stand Pivot Transfers  Sit to Stand Mod assist  Stand pivot transfers Mod assist  General transfer comment face to face assist to stand from chair and pivot without AD  Balance  Overall balance assessment Needs assistance  Sitting balance-Leahy Scale Fair  Standing balance support Bilateral upper extremity supported;Single extremity supported;During functional activity  Standing balance-Leahy Scale Poor  Standing balance comment reliant on external assist or AD  PT - End of Session  Activity Tolerance Patient limited by pain  Patient left in bed;with call bell/phone within reach;with family/visitor present   PT - Assessment/Plan  PT Plan Current plan remains appropriate  PT Visit Diagnosis Other abnormalities of gait and mobility (R26.89);Muscle weakness (generalized) (M62.81);Pain  Pain - Right/Left  (right > Left LE)  Pain - part of body Knee  PT Frequency (ACUTE ONLY) Min 4X/week  Follow Up Recommendations CIR  PT equipment None recommended by PT  AM-PAC PT "6 Clicks" Mobility Outcome Measure (Version 2)  Help needed turning from your back to your side while in a flat bed without using bedrails? 2  Help needed moving from lying on your back to sitting on the side of a flat bed without using bedrails? 2  Help needed moving to and from a bed to a chair (including a wheelchair)? 2  Help needed standing up from a chair using your arms (e.g., wheelchair or bedside chair)? 2  Help needed to walk in hospital room? 2  Help needed climbing 3-5 steps with a railing?  1  6 Click Score 11  Consider Recommendation  of Discharge To: CIR/SNF/LTACH  PT Goal  Progression  Progress towards PT goals Progressing toward goals  Acute Rehab PT Goals  PT Goal Formulation With patient/family  Time For Goal Achievement 05/17/20  Potential to Achieve Goals Good  PT Time Calculation  PT Start Time (ACUTE ONLY) 1610  PT Stop Time (ACUTE ONLY) 1624  PT Time Calculation (min) (ACUTE ONLY) 14 min  PT General Charges  $$ ACUTE PT VISIT 1 Visit  PT Treatments  $Therapeutic Activity 8-22 mins   05/08/2020  Ginger Carne., PT Acute Rehabilitation Services [phone]  (pager) [phone]  (office)

## 2020-05-08 NOTE — Progress Notes (Addendum)
PROGRESS NOTE        PATIENT DETAILS Name: Johnathan Murray Age: 69 y.o. Sex: male Date of Birth: 08-13-1951 Admit Date: 05/02/2020 Admitting Physician Norval Morton, MD JYN:[id number], [name], MD  Brief Narrative: Patient is a 69 y.o. 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  Patient was seen by cardiothoracic surgery-underwent debridement of aortic root abscess/aortic valve replacement with bioprosthetic valve-ID was consulted-patient was subsequently discharged to CIR on 6/10.  While at CIR-patient started having gradually worsening anemia, bilateral knee pain/swelling-arthrocentesis was suggestive of septic knee-patient subsequently underwent irrigation/debridement and total synovectomy of bilateral knees-postoperatively-patient was noted to have A. fib with RVR and severe anemia due to blood loss from left knee bleeding-and readmitted to the hospitalist service.  See below for further details.  Significant events: 4/29-5/18>> admit by [facility] for sepsis secondary to Enterococcus faecalis, found to have acute osteo/discitis of L/-/5, left knee septic arthritis-discharge to CIR  5/18-5/24>> at CIR-aortic root abscess/prosthetic aortic valve vegetation on TEE (done for cardioversion) 5/24-6/10>> admit by [facility] from CIR-for aortic root replacement-discharged back to CIR. 6/10-6/12>> at CIR- recurrent bilateral knee septic arthritis on arthrocentesis-ortho reconsulted 6/12-current>> admit to [facility] for A. fib with RVR, severe  anemia following bilateral knee washout for septic arthritis.  Significant postoperative bleeding from left knee.  Significant studies: 4/29>> CT abdomen/pelvis: No acute intra-abdominal or pelvic pathology 4/29>> CT C-spine: Interval removal of fixating plate/screws from C4-C6, no acute osseous abnormality 4/30>>left lower extremity Doppler: No DVT, ruptured Baker's cyst 5/3>> MRI C-spine: No evidence of infection, s/p C3-4 discectomy/fusion. 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  Procedures : 6/12>> irrigation/debridement with total synovectomy of bilateral knees 6/9>> TEE guided cardioversion 5/27>>S/p Redo median sternotomy, rightaxillaryartery graftfor cardiopulmonary bypass, extracorporeal circulation, debridement of aortic root abscess and pseudoaneurysm, and aortic root replacement using a 64mm Homograft aortic root with reimplantation of coronary arteries. 5/24>> TEE guided cardioversion.  Found to have vegetation on prosthetic aortic valve on TEE. 5/18>> fluoroscopy guided needle aspiration of L4-L5 disc space 5/4>> B/L knee irrigation irrigation debridement arthroscopic. B/L knee medial and lateral menisectomies.  Antimicrobial therapy: Ampicillin: 5/18>> Rocephin: 5/19>>  Microbiology data: 6/12>> knee culture: No growth 6/10>> left knee synovial fluid culture: No  growth  5/27>> aortic valve tissue culture: No growth 5/26>> blood culture: No growth 5/22>> urine culture: No growth 5/19>> left synovitic fluid culture: No growth 5/18>> L4-L5 disc aspirate: Enterococcus faecalis 5/6>> blood culture: No growth 5/3>> blood culture: No growth 5/2>> right knee synovial fluid: No growth 5/1>> left knee synovial fluid: Enterococcus  faecalis 4/30>> blood culture: Enterococcus faecalis 4/29>> urine culture: No growth  Consults: ID [facility] Cardiology Ortho  DVT Prophylaxis : Eliquis  Subjective: Lying comfortably in bed-no further bleeding from his left knee.  Assessment/Plan: Hemorrhagic shock 2/2 to post op bleeding from left knee with acute blood loss anemia superimposed on anemia of chronic disease: Shock physiology has resolved-transfused a total of 5 units of PRBC this admission.  Left knee bleeding seems to have essentially resolved.Evaluated by Manson Passey Sharol Given) on 6/16-okay to start anticoagulation-started on IV heparin-since no bleeding-transitioned to Eliquis on 6/17.  Hemoglobin stable at 7.5-he does not appear to have significant symptoms from anemia-reasonable to watch him for a few more days before considering transfusion.  A. fib with RVR: Rate controlled but remains in atrial fibrillation-continue amiodarone and low-dose beta-blocker.  Eliquis to be started on 6/17.  Cardiology following.    Enterococcus bacteremia-with L4-L5 discitis, bilateral septic knee, prosthetic valve vegetation with aortic root abscess: Evaluated by infectious disease-recommendations are to continue ceftriaxone/ampicillin till 05/20/2020-and then to start p.o. amoxicillin 500 mg twice daily from 05/21/2020.  Appointment made with Dr. Megan Salon on 06/03/2020 at 2:45 PM.    CAD-s/p CABG-no anginal symptoms-continue supportive care  Hypothyroidism: Continue levothyroxine  DM-2 with uncontrolled hyperglycemia: CBGs relatively stable-continue Lantus 10 units twice daily, SSI.   Follow and adjust.     Recent Labs    05/07/20 2148 05/08/20 0724 05/08/20 1206  GLUCAP 150* 151* 128*   Debility/deconditioning: Secondary to acute illness-we will need PT/OT-plans are to discharge back to CIR once insurance authorization/bed available..   Diet: Diet Order            Diet Carb Modified Fluid consistency: Thin; Room service appropriate? Yes  Diet effective now                Code Status: Full code   Family Communication: Spouse at bedside  Disposition Plan: Status is: Inpatient  Remains inpatient appropriate because:Inpatient level of care appropriate due to severity of illness  Dispo: The patient is from: Home              Anticipated d/c is to: CIR              Anticipated d/c date is: > 1 days              Patient currently is  medically stable to d/c.   Barriers to Discharge: Awaiting CIR bed/insurance authorization  Antimicrobial agents: Anti-infectives (From admission, onward)   Start     Dose/Rate Route Frequency Ordered Stop   05/21/20 1000  amoxicillin (AMOXIL) capsule 500 mg     Discontinue     500 mg Oral Every 12 hours 05/06/20 1103     05/21/20 0000  amoxicillin (AMOXIL) 500 MG capsule     Discontinue    Note to Pharmacy: Put on file. Patient going to CIR prior to discharge   500 mg Oral 2 times daily 05/06/20 1109 09/18/20 2359   05/03/20 0500  cefTRIAXone (ROCEPHIN) 2 g in sodium chloride 0.9 % 100 mL IVPB     Discontinue     2 g 200 mL/hr over 30 Minutes Intravenous Every 12 hours 05/02/20 2155 05/20/20 2359   05/02/20 2245  cefTRIAXone (ROCEPHIN) 2 g in sodium chloride 0.9 % 100 mL IVPB  Status:  Discontinued        2 g 200 mL/hr over 30 Minutes Intravenous Every 12 hours 05/02/20 1943 05/02/20 2155   05/02/20 1800  ampicillin (  OMNIPEN) 2 g in sodium chloride 0.9 % 100 mL IVPB     Discontinue     2 g 300 mL/hr over 20 Minutes Intravenous Every 4 hours 05/02/20 1745 05/20/20 2359       Time spent: 25 minutes-Greater than  50% of this time was spent in counseling, explanation of diagnosis, planning of further management, and coordination of care.  MEDICATIONS: Scheduled Meds: . amiodarone  400 mg Oral BID  . [START ON 05/21/2020] amoxicillin  500 mg Oral Q12H  . apixaban  5 mg Oral BID  . Chlorhexidine Gluconate Cloth  6 each Topical Daily  . feeding supplement (ENSURE ENLIVE)  237 mL Oral TID BM  . ferrous [id number] C-folic acid  1 capsule Oral Q breakfast  . insulin aspart  0-20 Units Subcutaneous TID WC  . insulin aspart  0-5 Units Subcutaneous QHS  . insulin glargine  10 Units Subcutaneous BID  . levothyroxine  25 mcg Oral QAC breakfast  . metoprolol tartrate  12.5 mg Oral BID  . oxyCODONE  10 mg Oral Q12H  . polyethylene glycol  17 g Oral Daily  . pregabalin  100 mg Oral TID  . senna  1 tablet Oral QHS  . sodium chloride flush  10-40 mL Intracatheter Q12H  . sodium chloride flush  3 mL Intravenous Q12H   Continuous Infusions: . ampicillin (OMNIPEN) IV 2 g (05/08/20 1411)  . cefTRIAXone (ROCEPHIN) IVPB 2 gram/100 mL NS (Mini-Bag Plus) 2 g (05/08/20 0512)   PRN Meds:.acetaminophen **OR** acetaminophen, albuterol, cyclobenzaprine, morphine injection, oxyCODONE, sodium chloride flush   PHYSICAL EXAM: Vital signs: Vitals:   05/08/20 0415 05/08/20 0735 05/08/20 0800 05/08/20 1308  BP: 92/64 106/71 109/68 104/75  Pulse: 100 91 85 (!) 108  Resp: 16 11 14 20   Temp: 97.9 F (36.6 C)  98 F (36.7 C) 98.3 F (36.8 C)  TempSrc: Oral  Oral Oral  SpO2: 97% 96% 98% 100%  Weight: 91.4 kg     Height:       Filed Weights   05/04/20 0441 05/07/20 0540 05/08/20 0415  Weight: 93.1 kg 92.6 kg 91.4 kg   Body mass index is 28.91 kg/m.   Gen Exam:Alert awake-not in any distress HEENT:atraumatic, normocephalic Chest: B/L clear to auscultation anteriorly CVS:S1S2 regular Abdomen:soft non tender, non distended Extremities:no edema Neurology: Non focal Skin: no rash  I have personally  reviewed following labs and imaging studies  LABORATORY DATA: CBC: Recent Labs  Lab 05/04/20 0600 05/04/20 1714 05/05/20 0403 05/06/20 0500 05/07/20 0430  WBC 10.8* 10.2 9.4 9.2 8.4  HGB 7.5* 6.8* 8.1* 7.7* 7.5*  HCT 22.7* 20.5* 24.5* 24.3* 24.2*  MCV 84.4 85.4 87.5 90.3 92.0  PLT 252 231 248 293 676    Basic Metabolic Panel: Recent Labs  Lab 05/03/20 0845 05/04/20 0600 05/05/20 0403  NA 134* 133* 136  K 4.6 4.4 4.1  CL 102 97* 100  CO2 24 27 26   GLUCOSE 269* 200* 172*  BUN 25* 24* 20  CREATININE 1.44* 1.38* 1.23  CALCIUM 8.3* 8.1* 8.3*  MG 1.9  --   --     GFR: Estimated Creatinine Clearance: 66.3 mL/min (by C-G formula based on SCr of 1.23 mg/dL).  Liver Function Tests: Recent Labs  Lab 05/05/20 0403  AST 25  ALT 41  ALKPHOS 75  BILITOT 0.5  PROT 4.7*  ALBUMIN 1.8*   No results for input(s): LIPASE, AMYLASE in the last 168 hours. No results for input(s): AMMONIA in the  last 168 hours.  Coagulation Profile: No results for input(s): INR, PROTIME in the last 168 hours.  Cardiac Enzymes: No results for input(s): CKTOTAL, CKMB, CKMBINDEX, TROPONINI in the last 168 hours.  BNP (last 3 results) No results for input(s): PROBNP in the last 8760 hours.  Lipid Profile: No results for input(s): CHOL, HDL, LDLCALC, TRIG, CHOLHDL, LDLDIRECT in the last 72 hours.  Thyroid Function Tests: No results for input(s): TSH, T4TOTAL, FREET4, T3FREE, THYROIDAB in the last 72 hours.  Anemia Panel: No results for input(s): VITAMINB12, FOLATE, FERRITIN, TIBC, IRON, RETICCTPCT in the last 72 hours.  Urine analysis:    Component Value Date/Time   COLORURINE YELLOW 04/11/2020 2245   APPEARANCEUR CLEAR 04/11/2020 2245   LABSPEC 1.009 04/11/2020 2245   PHURINE 7.0 04/11/2020 2245   GLUCOSEU NEGATIVE 04/11/2020 2245   HGBUR NEGATIVE 04/11/2020 2245   BILIRUBINUR NEGATIVE 04/11/2020 2245   KETONESUR NEGATIVE 04/11/2020 2245   PROTEINUR NEGATIVE 04/11/2020 2245    NITRITE NEGATIVE 04/11/2020 2245   LEUKOCYTESUR NEGATIVE 04/11/2020 2245    Sepsis Labs: Lactic Acid, Venous    Component Value Date/Time   LATICACIDVEN 1.0 03/20/2020 1517    MICROBIOLOGY: Recent Results (from the past 240 hour(s))  Body fluid culture     Status: None   Collection Time: 04/30/20  5:28 PM   Specimen: Synovium; Body Fluid  Result Value Ref Range Status   Specimen Description SYNOVIAL LEFT KNEE  Final   Special Requests NONE  Final   Gram Stain   Final    ABUNDANT WBC PRESENT, PREDOMINANTLY PMN NO ORGANISMS SEEN    Culture   Final    NO GROWTH Performed at [facility], [address] [place], [place] [address]    Report Status 05/04/2020 FINAL  Final  Aerobic/Anaerobic Culture (surgical/deep wound)     Status: None   Collection Time: 05/02/20  8:55 AM   Specimen: Soft Tissue, Other  Result Value Ref Range Status   Specimen Description TISSUE  Final   Special Requests LEFT KNEE TISSUE SPEC A  Final   Gram Stain   Final    ABUNDANT WBC PRESENT, PREDOMINANTLY PMN NO ORGANISMS SEEN    Culture   Final    No growth aerobically or anaerobically. Performed at [facility], [place] [address] [place], [place] [address]    Report Status 05/07/2020 FINAL  Final  Aerobic/Anaerobic Culture (surgical/deep wound)     Status: None   Collection Time: 05/02/20  8:58 AM   Specimen: Soft Tissue, Other  Result Value Ref Range Status   Specimen Description TISSUE  Final   Special Requests RIGHT KNEE TISSUE SPEC B  Final   Gram Stain   Final    RARE WBC PRESENT,BOTH PMN AND MONONUCLEAR NO ORGANISMS SEEN    Culture   Final    No growth aerobically or anaerobically. Performed at [facility], [place] [address] [place], [place] [address]    Report Status 05/07/2020 FINAL  Final    RADIOLOGY STUDIES/RESULTS: No results found.   LOS: 6 days   Oren Binet, MD  [facility]    To contact the attending provider between 7A-7P or the  covering provider during after hours 7P-7A, please log into the web site [email or web address] and access using universal [place] password for that web site. If you do not have the password, please call the hospital operator.  05/08/2020, 3:33 PM

## 2020-05-08 NOTE — Progress Notes (Signed)
Inpatient Rehab Admissions Coordinator:   Met with pt and his wife at bedside.  I am still awaiting a determination from insurance and will have no beds over the weekend for Johnathan Murray to admit.  Will f/u with pt next week for possible admission pending insurance auth and bed availability.   Shann Medal, PT, DPT Admissions Coordinator [phone] 05/08/20  11:39 AM

## 2020-05-08 NOTE — Progress Notes (Signed)
Occupational Therapy Treatment Patient Details Name: Johnathan Murray MRN: [id number] DOB: 01/11/1951 Today's Date: 05/08/2020    History of present illness Patient is a 69 y.o. 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  Patient was seen by cardiothoracic surgery-underwent debridement of aortic root abscess/aortic valve replacement with bioprosthetic valve 04/17/20 via median sternotomy-ID was consulted-patient was subsequently discharged to [facility] on 6/10.  While at CIR-patient started having gradually worsening anemia, bilateral knee pain/swelling-arthrocentesis was suggestive of septic knee-patient subsequently underwent irrigation/debridement and total synovectomy of bilateral knees-post procedure-patient was noted to have A. fib with RVR and severe anemia-and readmitted to the hospitalist service.   OT comments  Pt seen in conjunction with PT to optimize tolerance and participation to progress pt towards functional mobility goals. Overall, pt requires MOD - MAX A +2 for bed mobility and sit<>stand from EOB. Pt able to progress OOB to chair via stand pivot to recliner with MOD- MAX A +2. Pt noted to have increased pain and appeared to more guarded in comparison to previous session. Pt continues to requires cues to maintain sternal precautions functionally. Dc plan remains appropriate, will follow acutely per POC.   Follow Up Recommendations  CIR;Supervision/Assistance - 24 hour    Equipment Recommendations  Wheelchair (measurements OT);Wheelchair  cushion (measurements OT)    Recommendations for Other Services      Precautions / Restrictions Precautions Precautions: Sternal;Fall Precaution Comments: sternal Required Braces or Orthoses: Spinal Brace Spinal Brace: Lumbar corset Other Brace: for comfort, pt reports "it doesn't really help me" so we didn't use back brace Restrictions Other Position/Activity Restrictions: sternal precautions       Mobility Bed Mobility Overal bed mobility: Needs Assistance Bed Mobility: Rolling;Sidelying to Sit Rolling: Mod assist;+2 for physical assistance Sidelying to sit: Mod assist;+2 for physical assistance       General bed mobility comments: cues for technique to roll to pts R side. assist to bend bil knees to roll; MOD A +2 to elevate trunk  Transfers Overall transfer level: Needs assistance Equipment used: Rolling walker (2 wheeled) Transfers: Sit to/from [facility] Sit to Stand: Max assist;+2 physical assistance;From elevated surface Stand pivot transfers: Mod assist;+2 physical assistance;Max assist       General transfer comment: continued cues to maintain sternal precautions as pt continues to attempt to pull up on RW; MAX A +2 to power up into standing from elevated surface x2. Pt complete stand pivot to recliner with MOD A +2 needing cues initiate pivotal steps to recliner and manage RW    Balance Overall balance assessment: Needs assistance Sitting-balance support: Feet supported;No upper extremity supported Sitting balance-Leahy Scale: Fair     Standing balance support: Bilateral upper extremity supported;During functional activity Standing balance-Leahy Scale: Poor Standing balance comment: reliant on external assist                           ADL either performed or assessed with clinical judgement   ADL Overall ADL's : Needs assistance/impaired  Toilet Transfer: Moderate assistance;Maximal  assistance;+2 for physical assistance;+2 for safety/equipment;RW;Stand-pivot Toilet Transfer Details (indicate cue type and reason): simulated to recliner; cues for safety to maintain sternal precautions during mobility         Functional mobility during ADLs: +2 for physical assistance;Moderate assistance;+2 for safety/equipment;Maximal assistance;Rolling walker General ADL Comments: pt continues to present with pain, decreased activity tolerance and BLEs weakness impacting pts ability to engage in BADLs     Vision       Perception     Praxis      Cognition Arousal/Alertness: Awake/alert Behavior During Therapy: WFL for tasks assessed/performed Overall Cognitive Status: Within Functional Limits for tasks assessed                                          Exercises     Shoulder Instructions       General Comments pt in afib during session with HR max 147 bpm    Pertinent Vitals/ Pain       Pain Assessment: 0-10 Pain Score: 10-Worst pain ever (10 while moving; 6 while seated in chair) Pain Location: back > Bil knees Pain Descriptors / Indicators: Grimacing;Guarding;Discomfort Pain Intervention(s): Repositioned;Monitored during session  Home Living                                          Prior Functioning/Environment              Frequency  Min 2X/week        Progress Toward Goals  OT Goals(current goals can now be found in the care plan section)  Progress towards OT goals: Progressing toward goals  Acute Rehab OT Goals Patient Stated Goal: to get stronger and go back to work OT Goal Formulation: With patient Time For Goal Achievement: 05/18/20 Potential to Achieve Goals: Good  Plan Discharge plan remains appropriate;Frequency remains appropriate    Co-evaluation    PT/OT/SLP Co-Evaluation/Treatment: Yes Reason for Co-Treatment: Complexity of the patient's impairments (multi-system involvement);For  patient/therapist safety;To address functional/ADL transfers   OT goals addressed during session: ADL's and self-care      AM-PAC OT "6 Clicks" Daily Activity     Outcome Measure   Help from another person eating meals?: None Help from another person taking care of personal grooming?: A Little Help from another person toileting, which includes using toliet, bedpan, or urinal?: Total Help from another person bathing (including washing, rinsing, drying)?: A Lot Help from another person to put on and taking off regular upper body clothing?: A Little Help from another person to put on and taking off regular lower body clothing?: Total 6 Click Score: 14    End of Session Equipment Utilized During Treatment: Gait belt;Rolling walker  OT Visit Diagnosis: Muscle weakness (generalized) (M62.81);Other symptoms and signs involving cognitive function;Pain   Activity Tolerance Patient tolerated treatment well   Patient Left in chair;with call bell/phone within reach;with family/visitor present   Nurse Communication Mobility status        Time: 1445-1519 OT Time Calculation (min): 34 min  Charges: OT General Charges $OT Visit: 1 Visit OT Treatments $Therapeutic Activity: 8-22 mins  Lanier Clam., COTA/L Acute Rehabilitation Services [phone] [place] 05/08/2020, 4:44 PM

## 2020-05-08 NOTE — Progress Notes (Signed)
Physical Therapy Treatment Patient Details Name: Johnathan Murray MRN: [id number] DOB: 11/12/1951 Today's Date: 05/08/2020    History of Present Illness Patient is a 69 y.o. 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  Patient was seen by cardiothoracic surgery-underwent debridement of aortic root abscess/aortic valve replacement with bioprosthetic valve 04/17/20 via median sternotomy-ID was consulted-patient was subsequently discharged to [facility] on 6/10.  While at CIR-patient started having gradually worsening anemia, bilateral knee pain/swelling-arthrocentesis was suggestive of septic knee-patient subsequently underwent irrigation/debridement and total synovectomy of bilateral knees-post procedure-patient was noted to have A. fib with RVR and severe anemia-and readmitted to the hospitalist service.    PT Comments    Pt was unable to mobilize quite to the same level as last session, more painful, more stiff.  Emphasis on warm up, transition to EOB, sit to stand, pre gait and transfers.    Follow Up Recommendations  CIR     Equipment Recommendations  None recommended by PT    Recommendations for Other Services       Precautions / Restrictions Precautions Precautions: Sternal;Fall Precaution Comments: sternal Required Braces or Orthoses: Spinal Brace Spinal Brace: Lumbar corset Other Brace: for comfort, pt reports "it doesn't really help me" so we didn't use back brace Restrictions Other Position/Activity Restrictions: sternal precautions     Mobility  Bed Mobility Overal bed mobility: Needs Assistance Bed Mobility: Rolling;Sidelying to Sit Rolling: Mod assist;+2 for physical assistance Sidelying to sit: Mod assist;+2 for physical assistance     Sit to sidelying: Mod assist;+2 for physical assistance General bed mobility comments: cues for technique to roll to pts R side. assist to bend bil knees to roll; MOD A +2 to elevate trunk  Transfers Overall transfer level: Needs assistance Equipment used: Rolling walker (2 wheeled) Transfers: Sit to/from [facility] Sit to Stand: Max assist;+2 physical assistance;From elevated surface Stand pivot transfers: Mod assist;+2 physical assistance;Max assist      Lateral/Scoot Transfers: Mod assist General transfer comment: continued cues to maintain sternal precautions as pt continues to attempt to pull up on RW; MAX A +2 to power up into standing from elevated surface x2. Pt complete stand pivot to recliner with MOD A +2 needing cues initiate pivotal steps to recliner and manage RW  Ambulation/Gait Ambulation/Gait assistance:  (more assist with increased pain and fatigue)               Stairs             Wheelchair Mobility    Modified Rankin (Stroke Patients Only)       Balance Overall balance assessment: Needs assistance Sitting-balance support: Feet supported;No upper extremity supported Sitting balance-Leahy Scale: Fair Sitting balance - Comments: Due to back pain sitting with support is difficult and for a short period only.   Standing balance support: Bilateral upper extremity supported;Single extremity supported;During functional activity Standing balance-Leahy Scale: Fair Standing balance comment: reliant on external assist or AD                            Cognition Arousal/Alertness: Awake/alert Behavior During Therapy:  WFL for tasks assessed/performed Overall Cognitive Status: Within Functional Limits for tasks  assessed                                        Exercises Total Joint Exercises Heel Slides: AROM;AAROM;Both;10 reps;Supine;Strengthening (with graded resistance) General Exercises - Lower Extremity Quad Sets: AROM;Both;Supine;5 reps    General Comments General comments (skin integrity, edema, etc.): pt in afib during session with HR max 147 bpm      Pertinent Vitals/Pain Pain Assessment: 0-10 Pain Score: 10-Worst pain ever (10 while moving; 6 while seated in chair) Pain Location: back > Bil knees Pain Descriptors / Indicators: Grimacing;Guarding;Discomfort Pain Intervention(s): Repositioned;Monitored during session    Home Living                      Prior Function            PT Goals (current goals can now be found in the care plan section) Acute Rehab PT Goals Patient Stated Goal: to get stronger and go back to work PT Goal Formulation: With patient/family Time For Goal Achievement: 05/17/20 Potential to Achieve Goals: Good Progress towards PT goals: Progressing toward goals    Frequency    Min 4X/week      PT Plan Current plan remains appropriate    Co-evaluation PT/OT/SLP Co-Evaluation/Treatment: Yes Reason for Co-Treatment: Complexity of the patient's impairments (multi-system involvement) PT goals addressed during session: Mobility/safety with mobility OT goals addressed during session: ADL's and self-care      AM-PAC PT "6 Clicks" Mobility   Outcome Measure  Help needed turning from your back to your side while in a flat bed without using bedrails?: A Lot Help needed moving from lying on your back to sitting on the side of a flat bed without using bedrails?: A Lot Help needed moving to and from a bed to a chair (including a wheelchair)?: A Lot Help needed standing up from a chair using your arms (e.g., wheelchair or bedside chair)?: A Lot Help needed to walk in hospital room?: A Lot Help needed climbing 3-5 steps with a  railing? : Total 6 Click Score: 11    End of Session         PT Visit Diagnosis: Other abnormalities of gait and mobility (R26.89);Muscle weakness (generalized) (M62.81);Pain Pain - part of body:  (knees, back)     Time: 3888-2800 PT Time Calculation (min) (ACUTE ONLY): 34 min  Charges:  $Therapeutic Activity: 8-22 mins                     05/08/2020  Ginger Carne., PT Acute Rehabilitation Services [phone]  (pager) [phone]  (office)   Tessie Fass Audryana Hockenberry 05/08/2020, 7:23 PM

## 2020-05-08 NOTE — Progress Notes (Signed)
Post therapy pts HR was 90's to 140s jumping up and down. Notified MD, no new orders at this time.

## 2020-05-09 ENCOUNTER — Inpatient Hospital Stay (HOSPITAL_COMMUNITY): Payer: BC Managed Care – PPO

## 2020-05-09 LAB — GLUCOSE, CAPILLARY
Glucose-Capillary: 184 mg/dL — ABNORMAL HIGH (ref 70–99)
Glucose-Capillary: 142 mg/dL — ABNORMAL HIGH (ref 70–99)
Glucose-Capillary: 148 mg/dL — ABNORMAL HIGH (ref 70–99)
Glucose-Capillary: 142 mg/dL — ABNORMAL HIGH (ref 70–99)

## 2020-05-09 LAB — BASIC METABOLIC PANEL
Anion gap: 7 (ref 5–15)
BUN: 15 mg/dL (ref 8–23)
CO2: 27 mmol/L (ref 22–32)
Calcium: 8.4 mg/dL — ABNORMAL LOW (ref 8.9–10.3)
Chloride: 101 mmol/L (ref 98–111)
Creatinine, Ser: 1.25 mg/dL — ABNORMAL HIGH (ref 0.61–1.24)
GFR calc Af Amer: >60 mL/min (ref >60)
GFR calc non Af Amer: 59 mL/min — ABNORMAL LOW (ref >60)
Glucose, Bld: 190 mg/dL — ABNORMAL HIGH (ref 70–99)
Potassium: 4.2 mmol/L (ref 3.5–5.1)
Sodium: 135 mmol/L (ref 135–145)

## 2020-05-09 LAB — CBC
HCT: 22.8 % — ABNORMAL LOW (ref 39.0–52.0)
HCT: 23.4 % — ABNORMAL LOW (ref 39.0–52.0)
Hemoglobin: 7 g/dL — ABNORMAL LOW (ref 13.0–17.0)
Hemoglobin: 7.1 g/dL — ABNORMAL LOW (ref 13.0–17.0)
MCH: 28.1 pg (ref 26.0–34.0)
MCH: 28.2 pg (ref 26.0–34.0)
MCHC: 30.7 g/dL (ref 30.0–36.0)
MCHC: 30.3 g/dL (ref 30.0–36.0)
MCV: 91.6 fL (ref 80.0–100.0)
MCV: 92.9 fL (ref 80.0–100.0)
Platelets: 348 10*3/uL (ref 150–400)
Platelets: 345 10*3/uL (ref 150–400)
RBC: 2.49 MIL/uL — ABNORMAL LOW (ref 4.22–5.81)
RBC: 2.52 MIL/uL — ABNORMAL LOW (ref 4.22–5.81)
RDW: 17.8 % — ABNORMAL HIGH (ref 11.5–15.5)
RDW: 17.9 % — ABNORMAL HIGH (ref 11.5–15.5)
WBC: 8.9 10*3/uL (ref 4.0–10.5)
WBC: 9.2 10*3/uL (ref 4.0–10.5)
nRBC: 0 % (ref 0.0–0.2)
nRBC: 0 % (ref 0.0–0.2)

## 2020-05-09 LAB — BRAIN NATRIURETIC PEPTIDE: B Natriuretic Peptide: 151 pg/mL — ABNORMAL HIGH (ref 0.0–100.0)

## 2020-05-09 LAB — APTT: aPTT: 42 seconds — ABNORMAL HIGH (ref 24–36)

## 2020-05-09 LAB — MAGNESIUM: Magnesium: 1.9 mg/dL (ref 1.7–2.4)

## 2020-05-09 MED ORDER — AMIODARONE HCL 200 MG PO TABS
200.0000 mg | ORAL_TABLET | Freq: Two times a day (BID) | ORAL | Status: DC
  Administered 2020-05-09 – 2020-05-14 (×10): 200 mg via ORAL
  Filled 2020-05-09 (×10): qty 1

## 2020-05-09 MED ORDER — AMIODARONE HCL 200 MG PO TABS: 200.0000 mg | ORAL_TABLET | Freq: Every day | ORAL | Status: DC

## 2020-05-09 MED ORDER — HEPARIN (PORCINE) 25000 UT/250ML-% IV SOLN
1100.0000 [IU]/h | INTRAVENOUS | Status: DC
  Administered 2020-05-09: 1000 [IU]/h via INTRAVENOUS
  Filled 2020-05-09: qty 250

## 2020-05-09 MED ORDER — HEPARIN (PORCINE) 25000 UT/250ML-% IV SOLN: 1000.0000 [IU]/h | INTRAVENOUS | Status: DC

## 2020-05-09 NOTE — Progress Notes (Addendum)
ANTICOAGULATION CONSULT NOTE - Initial Consult  Pharmacy Consult for Heparin Indication: atrial fibrillation  Allergies  Allergen Reactions  . Atorvastatin Other (See Comments)    Urinary retention   . Nebivolol Diarrhea    Patient Measurements: Height: 5\' 10"  (177.8 cm) Weight: 91.6 kg (201 lb 15.1 oz) IBW/kg (Calculated) : 73 Heparin Dosing Weight: 91.4kg   Vital Signs: Temp: 98.9 F (37.2 C) (06/19 0720) Temp Source: Oral (06/19 0720) BP: 108/72 (06/19 0720) Pulse Rate: 119 (06/19 0720)  Labs: Recent Labs    05/06/20 2044 05/07/20 0430 05/09/20 0829  HGB  --  7.5* 7.0*  HCT  --  24.2* 22.8*  PLT  --  308 348  APTT 49*  --   --   HEPARINUNFRC 0.56 0.56  --   CREATININE  --   --  1.25*    Estimated Creatinine Clearance: 65.2 mL/min (A) (by C-G formula based on SCr of 1.25 mg/dL (H)).   Medical History: Past Medical History:  Diagnosis Date  . Abnormal LFTs (liver function tests) 10/31/2016  . Abnormal nuclear stress test 05/03/2018  . Acid reflux 02/15/2018   not at present  . Acute otitis externa of left ear 11/13/2019  . Acute suppurative otitis media of left ear without spontaneous rupture of tympanic membrane 11/13/2019  . Anemia    low iron  . Aortic stenosis 05/03/2018  . Aortic valve abscess 04/13/2020  . ARF (acute renal failure) ([place]) 03/20/2020  . Arthritis 02/15/2018   Overview:  shoulder  . Bacteremia due to Enterococcus 03/21/2020  . Cervical myelopathy ([place]) 04/22/2019  . Conductive hearing loss of left ear 11/13/2019  . Coronary artery disease involving native coronary artery of native heart without angina pectoris 05/30/2016  . Coronary artery disease of native artery of native heart with stable angina pectoris ([place])    pt. denies  . Diabetes mellitus due to underlying condition with unspecified complications ([place]) 2/77/8242  . Discitis 04/07/2020  . Dyslipidemia 05/30/2016  . Dyspnea    upon exertion,  as of 06/05/09 not having any recent chest  pain  . Dysrhythmia    a fib  . Endocarditis 04/13/2020  . Essential hypertension 05/30/2016  . Heart murmur    Has had an Aortic valve replacement  . History of fusion of cervical spine 03/23/2020  . Hyperactive gag reflex 02/15/2018  . Hypothyroidism   . Intermittent claudication ([place]) 10/31/2018  . Lumbar stenosis with neurogenic claudication 06/11/2019  . Macular degeneration 02/15/2018  . PAF (paroxysmal atrial fibrillation) ([place]) 05/30/2016  . Pre-operative cardiovascular examination 01/03/2020  . Preoperative cardiovascular examination 04/17/2019  . Prosthetic valve endocarditis ([place]) 04/13/2020  . Protein-calorie malnutrition, severe 03/24/2020  . Pseudogout of left knee 03/21/2020  . Renal insufficiency 01/17/2020  . S/P aortic valve replacement 01/17/2020  . S/P AVR 01/17/2020  . Septic arthritis ([place]) 03/23/2020  . Tinnitus of left ear 11/13/2019  . Typical atrial flutter [facility])     Assessment: 69 yo male admitted on 03/19/2020 with osteomylities/discitis at L4-5 and transitioned to CIR where he developed bilateral knee pain and swelling found to have bilateral septic knee and underwent I&D on 05/02/2020 and subsequently developed postoperative bleeding in left knee. Patient was on heparin on 6/16 and transitioned back to apixaban, which he was on prior to admission for Afib, on 6/17 after recommendation from ortho given bleed resolved. Today, Hgb 7.0 and pharmacy consulted to dose heparin for Afib. Will target lower end of goal range given bleeding. Last dose of  apixaban at [place] on 6/19.   Goal of Therapy:  Heparin level 0.3 - 0.5 units/ml given current bleeding  APTT 66 - 84 seconds  Monitor platelets by anticoagulation protocol: Yes   Plan:  Start heparin at 1000 units/hr at [place] (when next dose of apixaban would be due) Check aPTT at 0130 on 6/20  Will adjust based on aPTT given heparin level will be falsely elevated from apixaban.  Monitor heparin level, aPTT, CBC, and S/S of  bleeding daily   Cristela Felt, PharmD PGY1 Pharmacy Resident [employer]: [phone]  05/09/2020,9:18 AM   Addendum: Dr. Candiss Norse requested heparin be started after 2200 on 05/09/2020. Heparin infusion start time adjusted. aPTT to checked at 0400 after start of heparin infusion.

## 2020-05-09 NOTE — Progress Notes (Signed)
PROGRESS NOTE        PATIENT DETAILS Name: Johnathan Murray Age: 69 y.o. Sex: male Date of Birth: Jun 22, 1951 Admit Date: 05/02/2020 Admitting Physician Norval Morton, MD [id number], [name], MD  Brief Narrative: Patient is a 69 y.o. 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  Patient was seen by cardiothoracic surgery-underwent debridement of aortic root abscess/aortic valve replacement with bioprosthetic valve-ID was consulted-patient was subsequently discharged to CIR on 6/10.  While at CIR-patient started having gradually worsening anemia, bilateral knee pain/swelling-arthrocentesis was suggestive of septic knee-patient subsequently underwent irrigation/debridement and total synovectomy of bilateral knees-postoperatively-patient was noted to have A. fib with RVR and severe anemia due to blood loss from left knee bleeding-and readmitted to the hospitalist service.  See below for further details.  Significant events: 4/29-5/18>> admit by [facility] for sepsis secondary to Enterococcus faecalis, found to have acute osteo/discitis of L/-/5, left knee septic arthritis-discharge to CIR  5/18-5/24>> at CIR-aortic root abscess/prosthetic aortic valve vegetation on TEE (done for cardioversion) 5/24-6/10>> admit by [facility] from CIR-for aortic root replacement-discharged back to CIR. 6/10-6/12>> at CIR- recurrent bilateral knee septic arthritis on arthrocentesis-ortho reconsulted 6/12-current>> admit to [facility] for A. fib with RVR, severe  anemia following bilateral knee washout for septic arthritis.  Significant postoperative bleeding from left knee.  Significant studies: 4/29>> CT abdomen/pelvis: No acute intra-abdominal or pelvic pathology 4/29>> CT C-spine: Interval removal of fixating plate/screws from C4-C6, no acute osseous abnormality 4/30>>left lower extremity Doppler: No DVT, ruptured Baker's cyst 5/3>> MRI C-spine: No evidence of infection, s/p C3-4 discectomy/fusion. 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  Procedures : 6/12>> irrigation/debridement with total synovectomy of bilateral knees 6/9>> TEE guided cardioversion 5/27>>S/p Redo median sternotomy, rightaxillaryartery graftfor cardiopulmonary bypass, extracorporeal circulation, debridement of aortic root abscess and pseudoaneurysm, and aortic root replacement using a 63mm Homograft aortic root with reimplantation of coronary arteries. 5/24>> TEE guided cardioversion.  Found to have vegetation on prosthetic aortic valve on TEE. 5/18>> fluoroscopy guided needle aspiration of L4-L5 disc space 5/4>> B/L knee irrigation irrigation debridement arthroscopic. B/L knee medial and lateral menisectomies.  Antimicrobial therapy: Ampicillin: 5/18>> Rocephin: 5/19>>  Microbiology data: 6/12>> knee culture: No growth 6/10>> left knee synovial fluid culture: No  growth  5/27>> aortic valve tissue culture: No growth 5/26>> blood culture: No growth 5/22>> urine culture: No growth 5/19>> left synovitic fluid culture: No growth 5/18>> L4-L5 disc aspirate: Enterococcus faecalis 5/6>> blood culture: No growth 5/3>> blood culture: No growth 5/2>> right knee synovial fluid: No growth 5/1>> left knee synovial fluid: Enterococcus  faecalis 4/30>> blood culture: Enterococcus faecalis 4/29>> urine culture: No growth  Consults: ID [facility] Cardiology Ortho  DVT Prophylaxis : Eliquis  Subjective: Patient in bed, appears comfortable, denies any headache, no fever, no chest pain or pressure, no shortness of breath , no abdominal pain. No focal weakness.  Having bilateral knee pain right more than left.  Assessment/Plan:  Hemorrhagic shock 2/2 to post op bleeding from left knee with acute blood loss anemia superimposed on anemia of chronic disease: Shock physiology has resolved-transfused a total of 5 units of PRBC this admission.  Left knee bleeding seems to have essentially resolved.Evaluated by Manson Passey Sharol Given) on 6/16-okay to start anticoagulation-started on IV heparin-pain has worsened on 05/09/2020 after working with PT, hemoglobin around 7 which is essentially close to his baseline of 7.5, however ongoing bleeding or internal hematoma cannot be ruled out, hold Eliquis for 12 hours, start on bolus heparin after 10 PM on 05/09/2020, continue to monitor H&H, case discussed with Dr. Sharol Given.  A. fib with RVR with [place] vas 2 score of greater than 3: Rate controlled but remains in atrial fibrillation-continue amiodarone and low-dose beta-blocker.  Cardiology following, anticoagulation as above.  Enterococcus bacteremia-with L4-L5 discitis, bilateral septic knee, prosthetic valve vegetation with aortic root abscess: Evaluated by infectious disease-recommendations are to continue ceftriaxone/ampicillin till 05/20/2020-and then to start p.o. amoxicillin 500 mg twice daily from  05/21/2020.  Appointment made with Dr. Megan Salon on 06/03/2020 at 2:45 PM.    CAD-s/p CABG-no anginal symptoms-continue supportive care  Hypothyroidism: Continue levothyroxine  DM-2 with uncontrolled hyperglycemia: CBGs relatively stable-continue Lantus 10 units twice daily, SSI.  Follow and adjust.     Recent Labs    05/08/20 1640 05/08/20 2148 05/09/20 0758  GLUCAP 127* 136* 184*   Debility/deconditioning: Secondary to acute illness-we will need PT/OT-plans are to discharge back to CIR once insurance authorization/bed available..   Diet: Diet Order            Diet Carb Modified Fluid consistency: Thin; Room service appropriate? Yes  Diet effective now                Code Status: Full code   Family Communication: Spouse at bedside on 05/09/2020  Disposition Plan: Status is: Inpatient  Remains inpatient appropriate because:Inpatient level of care appropriate due to severity of illness  Dispo: The patient is from: Home              Anticipated d/c is to: CIR              Anticipated d/c date is: > 1 days              Patient currently is  medically stable to d/c.   Barriers to Discharge: Awaiting CIR bed/insurance authorization  Antimicrobial agents: Anti-infectives (From admission, onward)   Start     Dose/Rate Route Frequency Ordered Stop   05/21/20 1000  amoxicillin (AMOXIL) capsule 500 mg     Discontinue     500 mg Oral Every 12 hours 05/06/20 1103     05/21/20 0000  amoxicillin (AMOXIL) 500 MG capsule     Discontinue    Note to Pharmacy: Put on file. Patient going to CIR prior to discharge   500 mg Oral 2 times daily 05/06/20 1109 09/18/20 2359   05/03/20 0500  cefTRIAXone (ROCEPHIN) 2 g in sodium chloride 0.9 % 100 mL IVPB     Discontinue     2 g 200 mL/hr over 30 Minutes Intravenous Every 12 hours  05/02/20 2155 05/20/20 2359   05/02/20 2245  cefTRIAXone (ROCEPHIN) 2 g in sodium chloride 0.9 % 100 mL IVPB  Status:  Discontinued        2 g 200 mL/hr over 30  Minutes Intravenous Every 12 hours 05/02/20 1943 05/02/20 2155   05/02/20 1800  ampicillin (OMNIPEN) 2 g in sodium chloride 0.9 % 100 mL IVPB     Discontinue     2 g 300 mL/hr over 20 Minutes Intravenous Every 4 hours 05/02/20 1745 05/20/20 2359       Time spent: 25 minutes-Greater than 50% of this time was spent in counseling, explanation of diagnosis, planning of further management, and coordination of care.  MEDICATIONS: Scheduled Meds: . amiodarone  200 mg Oral BID   Followed by  . [START ON 05/24/2020] amiodarone  200 mg Oral Daily  . [START ON 05/21/2020] amoxicillin  500 mg Oral Q12H  . Chlorhexidine Gluconate Cloth  6 each Topical Daily  . feeding supplement (ENSURE ENLIVE)  237 mL Oral TID BM  . ferrous [id number] C-folic acid  1 capsule Oral Q breakfast  . insulin aspart  0-20 Units Subcutaneous TID WC  . insulin aspart  0-5 Units Subcutaneous QHS  . insulin glargine  10 Units Subcutaneous BID  . levothyroxine  25 mcg Oral QAC breakfast  . metoprolol tartrate  12.5 mg Oral BID  . oxyCODONE  10 mg Oral Q12H  . polyethylene glycol  17 g Oral Daily  . pregabalin  100 mg Oral TID  . senna  1 tablet Oral QHS  . sodium chloride flush  10-40 mL Intracatheter Q12H   Continuous Infusions: . ampicillin (OMNIPEN) IV 2 g (05/09/20 4270)  . cefTRIAXone (ROCEPHIN) IVPB 2 gram/100 mL NS (Mini-Bag Plus) 2 g (05/09/20 0504)  . heparin     PRN Meds:.acetaminophen **OR** acetaminophen, albuterol, cyclobenzaprine, morphine injection, oxyCODONE   PHYSICAL EXAM: Vital signs: Vitals:   05/09/20 0437 05/09/20 0444 05/09/20 0720 05/09/20 0900  BP: (!) 97/58  108/72   Pulse: (!) 128 (!) 119 (!) 119   Resp: 13 16 (!) 21   Temp: 98.7 F (37.1 C)  98.9 F (37.2 C)   TempSrc: Oral  Oral   SpO2: 96% 96% 96%   Weight: 91.6 kg   91.6 kg  Height:    5\' 10"  (1.778 m)   Filed Weights   05/08/20 0415 05/09/20 0437 05/09/20 0900  Weight: 91.4 kg 91.6 kg 91.6 kg   Body mass index  is 28.98 kg/m.   Gen Exam:  Awake Alert, No new F.N deficits, Normal affect [place].AT,PERRAL Supple Neck,No JVD, No cervical lymphadenopathy appriciated.  Symmetrical Chest wall movement, Good air movement bilaterally, CTAB RRR,No Gallops, positive heart murmur, +ve B.Sounds, Abd Soft, No tenderness, No organomegaly appriciated, No rebound - guarding or rigidity. No Cyanosis, bilateral moderate knee swelling, minimal bleeding around the left knee incision site, both incision sites appear stable no signs of hematoma on exam i.e. no discoloration   I have personally reviewed following labs and imaging studies  LABORATORY DATA: CBC: Recent Labs  Lab 05/04/20 1714 05/05/20 0403 05/06/20 0500 05/07/20 0430 05/09/20 0829  WBC 10.2 9.4 9.2 8.4 8.9  HGB 6.8* 8.1* 7.7* 7.5* 7.0*  HCT 20.5* 24.5* 24.3* 24.2* 22.8*  MCV 85.4 87.5 90.3 92.0 91.6  PLT 231 248 293 308 623    Basic Metabolic Panel: Recent Labs  Lab 05/03/20 0845 05/04/20 0600 05/05/20 0403 05/09/20 0829  NA 134* 133* 136 135  K 4.6 4.4 4.1 4.2  CL 102 97* 100 101  CO2 24 27 26 27   GLUCOSE 269* 200* 172* 190*  BUN 25* 24* 20 15  CREATININE 1.44* 1.38* 1.23 1.25*  CALCIUM 8.3* 8.1* 8.3* 8.4*  MG 1.9  --   --  1.9    GFR: Estimated Creatinine Clearance: 65.2 mL/min (A) (by C-G formula based on SCr of 1.25 mg/dL (H)).  Liver Function Tests: Recent Labs  Lab 05/05/20 0403  AST 25  ALT 41  ALKPHOS 75  BILITOT 0.5  PROT 4.7*  ALBUMIN 1.8*   No results for input(s): LIPASE, AMYLASE in the last 168 hours. No results for input(s): AMMONIA in the last 168 hours.  Coagulation Profile: No results for input(s): INR, PROTIME in the last 168 hours.  Cardiac Enzymes: No results for input(s): CKTOTAL, CKMB, CKMBINDEX, TROPONINI in the last 168 hours.  BNP (last 3 results) No results for input(s): PROBNP in the last 8760 hours.  Lipid Profile: No results for input(s): CHOL, HDL, LDLCALC, TRIG, CHOLHDL,  LDLDIRECT in the last 72 hours.  Thyroid Function Tests: No results for input(s): TSH, T4TOTAL, FREET4, T3FREE, THYROIDAB in the last 72 hours.  Anemia Panel: No results for input(s): VITAMINB12, FOLATE, FERRITIN, TIBC, IRON, RETICCTPCT in the last 72 hours.  Urine analysis:    Component Value Date/Time   COLORURINE YELLOW 04/11/2020 2245   APPEARANCEUR CLEAR 04/11/2020 2245   LABSPEC 1.009 04/11/2020 2245   PHURINE 7.0 04/11/2020 2245   GLUCOSEU NEGATIVE 04/11/2020 2245   HGBUR NEGATIVE 04/11/2020 2245   BILIRUBINUR NEGATIVE 04/11/2020 2245   KETONESUR NEGATIVE 04/11/2020 2245   PROTEINUR NEGATIVE 04/11/2020 2245   NITRITE NEGATIVE 04/11/2020 2245   LEUKOCYTESUR NEGATIVE 04/11/2020 2245    Sepsis Labs: Lactic Acid, Venous    Component Value Date/Time   LATICACIDVEN 1.0 03/20/2020 1517    MICROBIOLOGY: Recent Results (from the past 240 hour(s))  Body fluid culture     Status: None   Collection Time: 04/30/20  5:28 PM   Specimen: Synovium; Body Fluid  Result Value Ref Range Status   Specimen Description SYNOVIAL LEFT KNEE  Final   Special Requests NONE  Final   Gram Stain   Final    ABUNDANT WBC PRESENT, PREDOMINANTLY PMN NO ORGANISMS SEEN    Culture   Final    NO GROWTH Performed at [facility], [address], [place], [place] [address]    Report Status 05/04/2020 FINAL  Final  Aerobic/Anaerobic Culture (surgical/deep wound)     Status: None   Collection Time: 05/02/20  8:55 AM   Specimen: Soft Tissue, Other  Result Value Ref Range Status   Specimen Description TISSUE  Final   Special Requests LEFT KNEE TISSUE SPEC A  Final   Gram Stain   Final    ABUNDANT WBC PRESENT, PREDOMINANTLY PMN NO ORGANISMS SEEN    Culture   Final    No growth aerobically or anaerobically. Performed at [facility], [place] [address], [place], [place] [address]    Report Status 05/07/2020 FINAL  Final  Aerobic/Anaerobic Culture (surgical/deep wound)     Status: None     Collection Time: 05/02/20  8:58 AM   Specimen: Soft Tissue, Other  Result Value Ref Range Status   Specimen Description TISSUE  Final   Special Requests RIGHT KNEE TISSUE SPEC B  Final   Gram Stain   Final    RARE WBC PRESENT,BOTH PMN AND MONONUCLEAR NO ORGANISMS SEEN  Culture   Final    No growth aerobically or anaerobically. Performed at [facility], [place] [address], [place], [place] [address]    Report Status 05/07/2020 FINAL  Final    RADIOLOGY STUDIES/RESULTS: No results found.   LOS: 7 days   Lala Lund, MD  [facility]    To contact the attending provider between 7A-7P or the covering provider during after hours 7P-7A, please log into the web site [email or web address] and access using universal [place] password for that web site. If you do not have the password, please call the hospital operator.  05/09/2020, 10:58 AM

## 2020-05-10 LAB — GLUCOSE, CAPILLARY
Glucose-Capillary: 146 mg/dL — ABNORMAL HIGH (ref 70–99)
Glucose-Capillary: 158 mg/dL — ABNORMAL HIGH (ref 70–99)
Glucose-Capillary: 148 mg/dL — ABNORMAL HIGH (ref 70–99)
Glucose-Capillary: 111 mg/dL — ABNORMAL HIGH (ref 70–99)

## 2020-05-10 LAB — BRAIN NATRIURETIC PEPTIDE: B Natriuretic Peptide: 149 pg/mL — ABNORMAL HIGH (ref 0.0–100.0)

## 2020-05-10 LAB — CBC WITH DIFFERENTIAL/PLATELET
Abs Immature Granulocytes: 0.09 10*3/uL — ABNORMAL HIGH (ref 0.00–0.07)
Basophils Absolute: 0 10*3/uL (ref 0.0–0.1)
Basophils Relative: 1 %
Eosinophils Absolute: 0.4 10*3/uL (ref 0.0–0.5)
Eosinophils Relative: 5 %
HCT: 20.4 % — ABNORMAL LOW (ref 39.0–52.0)
Hemoglobin: 6.2 g/dL — CL (ref 13.0–17.0)
Immature Granulocytes: 1 %
Lymphocytes Relative: 12 %
Lymphs Abs: 1 10*3/uL (ref 0.7–4.0)
MCH: 28.3 pg (ref 26.0–34.0)
MCHC: 30.4 g/dL (ref 30.0–36.0)
MCV: 93.2 fL (ref 80.0–100.0)
Monocytes Absolute: 0.7 10*3/uL (ref 0.1–1.0)
Monocytes Relative: 8 %
Neutro Abs: 6.3 10*3/uL (ref 1.7–7.7)
Neutrophils Relative %: 73 %
Platelets: 324 10*3/uL (ref 150–400)
RBC: 2.19 MIL/uL — ABNORMAL LOW (ref 4.22–5.81)
RDW: 18 % — ABNORMAL HIGH (ref 11.5–15.5)
WBC: 8.5 10*3/uL (ref 4.0–10.5)
nRBC: 0 % (ref 0.0–0.2)

## 2020-05-10 LAB — PREPARE RBC (CROSSMATCH)

## 2020-05-10 LAB — COMPREHENSIVE METABOLIC PANEL
ALT: 33 U/L (ref 0–44)
AST: 22 U/L (ref 15–41)
Albumin: 1.7 g/dL — ABNORMAL LOW (ref 3.5–5.0)
Alkaline Phosphatase: 74 U/L (ref 38–126)
Anion gap: 7 (ref 5–15)
BUN: 16 mg/dL (ref 8–23)
CO2: 26 mmol/L (ref 22–32)
Calcium: 8 mg/dL — ABNORMAL LOW (ref 8.9–10.3)
Chloride: 99 mmol/L (ref 98–111)
Creatinine, Ser: 1.31 mg/dL — ABNORMAL HIGH (ref 0.61–1.24)
GFR calc Af Amer: >60 mL/min (ref >60)
GFR calc non Af Amer: 56 mL/min — ABNORMAL LOW (ref >60)
Glucose, Bld: 183 mg/dL — ABNORMAL HIGH (ref 70–99)
Potassium: 4 mmol/L (ref 3.5–5.1)
Sodium: 132 mmol/L — ABNORMAL LOW (ref 135–145)
Total Bilirubin: 0.5 mg/dL (ref 0.3–1.2)
Total Protein: 4.7 g/dL — ABNORMAL LOW (ref 6.5–8.1)

## 2020-05-10 LAB — HEMOGLOBIN AND HEMATOCRIT, BLOOD
HCT: 27 % — ABNORMAL LOW (ref 39.0–52.0)
Hemoglobin: 8.5 g/dL — ABNORMAL LOW (ref 13.0–17.0)

## 2020-05-10 LAB — MAGNESIUM: Magnesium: 1.9 mg/dL (ref 1.7–2.4)

## 2020-05-10 LAB — HEPARIN LEVEL (UNFRACTIONATED): Heparin Unfractionated: >2 IU/mL — ABNORMAL HIGH (ref 0.30–0.70)

## 2020-05-10 LAB — APTT: aPTT: 63 seconds — ABNORMAL HIGH (ref 24–36)

## 2020-05-10 MED ORDER — POTASSIUM CHLORIDE CRYS ER 20 MEQ PO TBCR
20.0000 meq | EXTENDED_RELEASE_TABLET | Freq: Once | ORAL | Status: AC
  Administered 2020-05-10: 20 meq via ORAL
  Filled 2020-05-10: qty 1

## 2020-05-10 MED ORDER — SODIUM CHLORIDE 0.9% IV SOLUTION: Freq: Once | INTRAVENOUS | Status: AC

## 2020-05-10 MED ORDER — FUROSEMIDE 10 MG/ML IJ SOLN
40.0000 mg | Freq: Once | INTRAMUSCULAR | Status: AC
  Administered 2020-05-10: 40 mg via INTRAVENOUS
  Filled 2020-05-10: qty 4

## 2020-05-10 NOTE — Progress Notes (Signed)
Patient A&O x4. VSS. Patient on tele, Afib on the monitor. 2 units of PRBC transfused during shift per orders. Pt turns self in bed.  Pt tolerating fluids and meals. Pain managed with PRN, patient educated on pain management regimen. Pt voiding adequately during shift. Potassium supplemented during shift. Bed wheels locked. Phone and call bell within reach. Pt is resting, no distress. POC reviewed with patient and wife.

## 2020-05-10 NOTE — Progress Notes (Signed)
Johnathan Murray for Heparin Indication: atrial fibrillation  Allergies  Allergen Reactions  . Atorvastatin Other (See Comments)    Urinary retention   . Nebivolol Diarrhea    Patient Measurements: Height: 5\' 10"  (177.8 cm) Weight: 91.6 kg (201 lb 15.1 oz) IBW/kg (Calculated) : 73 Heparin Dosing Weight: 91.4kg   Vital Signs: Temp: 98.3 F (36.8 C) (06/20 0347) Temp Source: Oral (06/19 2339) BP: 93/54 (06/20 0347) Pulse Rate: 86 (06/20 0347)  Labs: Recent Labs    05/09/20 0829 05/09/20 1124 05/10/20 0409  HGB 7.0* 7.1*  --   HCT 22.8* 23.4*  --   PLT 348 345  --   APTT  --  42* 63*  CREATININE 1.25*  --   --     Estimated Creatinine Clearance: 65.2 mL/min (A) (by C-G formula based on SCr of 1.25 mg/dL (H)).   Medical History: Past Medical History:  Diagnosis Date  . Abnormal LFTs (liver function tests) 10/31/2016  . Abnormal nuclear stress test 05/03/2018  . Acid reflux 02/15/2018   not at present  . Acute otitis externa of left ear 11/13/2019  . Acute suppurative otitis media of left ear without spontaneous rupture of tympanic membrane 11/13/2019  . Anemia    low iron  . Aortic stenosis 05/03/2018  . Aortic valve abscess 04/13/2020  . ARF (acute renal failure) ([place]) 03/20/2020  . Arthritis 02/15/2018   Overview:  shoulder  . Bacteremia due to Enterococcus 03/21/2020  . Cervical myelopathy ([place]) 04/22/2019  . Conductive hearing loss of left ear 11/13/2019  . Coronary artery disease involving native coronary artery of native heart without angina pectoris 05/30/2016  . Coronary artery disease of native artery of native heart with stable angina pectoris ([place])    pt. denies  . Diabetes mellitus due to underlying condition with unspecified complications ([place]) 1/85/6314  . Discitis 04/07/2020  . Dyslipidemia 05/30/2016  . Dyspnea    upon exertion,  as of 06/05/09 not having any recent chest pain  . Dysrhythmia    a fib  . Endocarditis  04/13/2020  . Essential hypertension 05/30/2016  . Heart murmur    Has had an Aortic valve replacement  . History of fusion of cervical spine 03/23/2020  . Hyperactive gag reflex 02/15/2018  . Hypothyroidism   . Intermittent claudication ([place]) 10/31/2018  . Lumbar stenosis with neurogenic claudication 06/11/2019  . Macular degeneration 02/15/2018  . PAF (paroxysmal atrial fibrillation) ([place]) 05/30/2016  . Pre-operative cardiovascular examination 01/03/2020  . Preoperative cardiovascular examination 04/17/2019  . Prosthetic valve endocarditis ([place]) 04/13/2020  . Protein-calorie malnutrition, severe 03/24/2020  . Pseudogout of left knee 03/21/2020  . Renal insufficiency 01/17/2020  . S/P aortic valve replacement 01/17/2020  . S/P AVR 01/17/2020  . Septic arthritis ([place]) 03/23/2020  . Tinnitus of left ear 11/13/2019  . Typical atrial flutter [facility])     Assessment: 69 yo male admitted on 03/19/2020 with osteomylities/discitis at L4-5 and transitioned to CIR where he developed bilateral knee pain and swelling found to have bilateral septic knee and underwent I&D on 05/02/2020 and subsequently developed postoperative bleeding in left knee. Patient was on heparin on 6/16 and transitioned back to apixaban, which he was on prior to admission for Afib, on 6/17 after recommendation from ortho given bleed resolved. Today, Hgb 7.0 and pharmacy consulted to dose heparin for Afib. Will target lower end of goal range given bleeding. Last dose of apixaban at [place] on 6/19.   6/20 AM update:  APTT just below goal at 63  Goal of Therapy:  Heparin level 0.3 - 0.5 units/ml given current bleeding  APTT 66 - 84 seconds  Monitor platelets by anticoagulation protocol: Yes   Plan:  Inc heparin to 1100 units/hr Re-check aPTT and heparin level at [place], PharmD, [place] Pharmacist Phone: [phone]

## 2020-05-10 NOTE — Progress Notes (Signed)
PROGRESS NOTE        PATIENT DETAILS Name: Johnathan Murray Age: 69 y.o. Sex: male Date of Birth: 1951-09-30 Admit Date: 05/02/2020 Admitting Physician Norval Morton, MD GEX:[id number], [name], MD  Brief Narrative: Patient is a 69 y.o. 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  Patient was seen by cardiothoracic surgery-underwent debridement of aortic root abscess/aortic valve replacement with bioprosthetic valve-ID was consulted-patient was subsequently discharged to CIR on 6/10.  While at CIR-patient started having gradually worsening anemia, bilateral knee pain/swelling-arthrocentesis was suggestive of septic knee-patient subsequently underwent irrigation/debridement and total synovectomy of bilateral knees-postoperatively-patient was noted to have A. fib with RVR and severe anemia due to blood loss from left knee bleeding-and readmitted to the hospitalist service.  See below for further details.  Significant events: 4/29-5/18>> admit by [facility] for sepsis secondary to Enterococcus faecalis, found to have acute osteo/discitis of L/-/5, left knee septic arthritis-discharge to CIR  5/18-5/24>> at CIR-aortic root abscess/prosthetic aortic valve vegetation on TEE (done for cardioversion) 5/24-6/10>> admit by [facility] from CIR-for aortic root replacement-discharged back to CIR. 6/10-6/12>> at CIR- recurrent bilateral knee septic arthritis on arthrocentesis-ortho reconsulted 6/12-current>> admit to [facility] for A. fib with RVR, severe  anemia following bilateral knee washout for septic arthritis.  Significant postoperative bleeding from left knee.  Significant studies: 4/29>> CT abdomen/pelvis: No acute intra-abdominal or pelvic pathology 4/29>> CT C-spine: Interval removal of fixating plate/screws from C4-C6, no acute osseous abnormality 4/30>>left lower extremity Doppler: No DVT, ruptured Baker's cyst 5/3>> MRI C-spine: No evidence of infection, s/p C3-4 discectomy/fusion. 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  Procedures : 6/12>> irrigation/debridement with total synovectomy of bilateral knees 6/9>> TEE guided cardioversion 5/27>>S/p Redo median sternotomy, rightaxillaryartery graftfor cardiopulmonary bypass, extracorporeal circulation, debridement of aortic root abscess and pseudoaneurysm, and aortic root replacement using a 34mm Homograft aortic root with reimplantation of coronary arteries. 5/24>> TEE guided cardioversion.  Found to have vegetation on prosthetic aortic valve on TEE. 5/18>> fluoroscopy guided needle aspiration of L4-L5 disc space 5/4>> B/L knee irrigation irrigation debridement arthroscopic. B/L knee medial and lateral menisectomies.  Antimicrobial therapy: Ampicillin: 5/18>> Rocephin: 5/19>>  Microbiology data: 6/12>> knee culture: No growth 6/10>> left knee synovial fluid culture: No  growth  5/27>> aortic valve tissue culture: No growth 5/26>> blood culture: No growth 5/22>> urine culture: No growth 5/19>> left synovitic fluid culture: No growth 5/18>> L4-L5 disc aspirate: Enterococcus faecalis 5/6>> blood culture: No growth 5/3>> blood culture: No growth 5/2>> right knee synovial fluid: No growth 5/1>> left knee synovial fluid: Enterococcus  faecalis 4/30>> blood culture: Enterococcus faecalis 4/29>> urine culture: No growth  Consults: ID [facility] Cardiology Ortho  DVT Prophylaxis : Eliquis  Subjective:  Patient in bed, appears comfortable, denies any headache, no fever, no chest pain or pressure, no shortness of breath , no abdominal pain. No focal weakness.  Today has more pain in the left knee than right.  Assessment/Plan:  Hemorrhagic shock 2/2 to post op bleeding from left knee with acute blood loss anemia superimposed on anemia of chronic disease: Shock physiology has resolved-transfused a total of 5 units of PRBC this admission.  Left knee bleeding seems to have essentially resolved.Evaluated by Manson Passey Sharol Given) on 6/16-okay to start anticoagulation-  Coagulation was cautiously started on 05/06/2020 however on 05/10/2020 he likely has bled again and heparin drip has been stopped.  Will transfuse another 2 units of packed RBCs on 05/10/2020.  Case has been discussed with Dr. Sharol Given orthopedics he will see the patient shortly.   A. fib with RVR with [place] vas 2 score of greater than 3: Rate controlled but remains in atrial fibrillation-continue amiodarone and low-dose beta-blocker.  Cardiology following, anticoagulation as above.  Patient and family understand the risks and benefits of holding anticoagulation or using it.  Enterococcus bacteremia-with L4-L5 discitis, bilateral septic knee, prosthetic valve vegetation with aortic root abscess: Evaluated by infectious disease-recommendations are to continue ceftriaxone/ampicillin till 05/20/2020-and then to start p.o.  amoxicillin 500 mg twice daily from 05/21/2020.  Appointment made with Dr. Megan Salon on 06/03/2020 at 2:45 PM.    CAD-s/p CABG-no anginal symptoms-continue supportive care  Hypothyroidism: Continue levothyroxine  DM-2 with uncontrolled hyperglycemia: CBGs relatively stable-continue Lantus 10 units twice daily, SSI.  Follow and adjust.     Recent Labs    05/09/20 1130 05/09/20 1622 05/09/20 2139  GLUCAP 142* 148* 142*   Debility/deconditioning: Secondary to acute illness-we will need PT/OT-plans are to discharge back to CIR once insurance authorization/bed available..   Diet: Diet Order            Diet Carb Modified Fluid consistency: Thin; Room service appropriate? Yes  Diet effective now                Code Status: Full code   Family Communication: Spouse at bedside on 05/09/2020  Disposition Plan: Status is: Inpatient  Remains inpatient appropriate because:Inpatient level of care appropriate due to severity of illness  Dispo: The patient is from: Home              Anticipated d/c is to: CIR              Anticipated d/c date is: > 1 days              Patient currently is  medically stable to d/c.   Barriers to Discharge: Awaiting CIR bed/insurance authorization  Antimicrobial agents: Anti-infectives (From admission, onward)   Start     Dose/Rate Route Frequency Ordered Stop   05/21/20 1000  amoxicillin (AMOXIL) capsule 500 mg     Discontinue     500 mg Oral Every 12 hours 05/06/20 1103     05/21/20 0000  amoxicillin (AMOXIL) 500 MG capsule     Discontinue    Note to Pharmacy: Put on file. Patient going to CIR prior to discharge   500 mg Oral 2 times daily 05/06/20 1109 09/18/20 2359   05/03/20 0500  cefTRIAXone (ROCEPHIN) 2 g in sodium chloride 0.9 % 100 mL IVPB     Discontinue     2  g 200 mL/hr over 30 Minutes Intravenous Every 12 hours 05/02/20 2155 05/20/20 2359   05/02/20 2245  cefTRIAXone (ROCEPHIN) 2 g in sodium chloride 0.9 % 100 mL IVPB  Status:   Discontinued        2 g 200 mL/hr over 30 Minutes Intravenous Every 12 hours 05/02/20 1943 05/02/20 2155   05/02/20 1800  ampicillin (OMNIPEN) 2 g in sodium chloride 0.9 % 100 mL IVPB     Discontinue     2 g 300 mL/hr over 20 Minutes Intravenous Every 4 hours 05/02/20 1745 05/20/20 2359       Time spent: 25 minutes-Greater than 50% of this time was spent in counseling, explanation of diagnosis, planning of further management, and coordination of care.  MEDICATIONS: Scheduled Meds: . amiodarone  200 mg Oral BID   Followed by  . [START ON 05/24/2020] amiodarone  200 mg Oral Daily  . [START ON 05/21/2020] amoxicillin  500 mg Oral Q12H  . Chlorhexidine Gluconate Cloth  6 each Topical Daily  . feeding supplement (ENSURE ENLIVE)  237 mL Oral TID BM  . ferrous [id number] C-folic acid  1 capsule Oral Q breakfast  . insulin aspart  0-20 Units Subcutaneous TID WC  . insulin aspart  0-5 Units Subcutaneous QHS  . insulin glargine  10 Units Subcutaneous BID  . levothyroxine  25 mcg Oral QAC breakfast  . metoprolol tartrate  12.5 mg Oral BID  . oxyCODONE  10 mg Oral Q12H  . polyethylene glycol  17 g Oral Daily  . pregabalin  100 mg Oral TID  . senna  1 tablet Oral QHS  . sodium chloride flush  10-40 mL Intracatheter Q12H   Continuous Infusions: . ampicillin (OMNIPEN) IV 2 g (05/10/20 0940)  . cefTRIAXone (ROCEPHIN) IVPB 2 gram/100 mL NS (Mini-Bag Plus) 2 g (05/10/20 0517)   PRN Meds:.acetaminophen **OR** acetaminophen, albuterol, cyclobenzaprine, morphine injection, oxyCODONE   PHYSICAL EXAM: Vital signs: Vitals:   05/10/20 0910 05/10/20 0915 05/10/20 0945 05/10/20 1000  BP: 93/71 96/65  124/84  Pulse: (!) 105 (!) 110 100 (!) 113  Resp: 18 16 19 15   Temp: 98.2 F (36.8 C)     TempSrc: Oral     SpO2: 100% 100% 100% 100%  Weight:      Height:       Filed Weights   05/08/20 0415 05/09/20 0437 05/09/20 0900  Weight: 91.4 kg 91.6 kg 91.6 kg   Body mass index is 28.98  kg/m.   Gen Exam:  Awake Alert, No new F.N deficits, Normal affect [place].AT,PERRAL Supple Neck,No JVD, No cervical lymphadenopathy appriciated.  Symmetrical Chest wall movement, Good air movement bilaterally, CTAB RRR,No Gallops, positive heart murmur, No Parasternal Heave +ve B.Sounds, Abd Soft, No tenderness, No organomegaly appriciated, No rebound - guarding or rigidity. No Cyanosis, bilateral moderate knee swelling, minimal bleeding around the left knee incision site, both incision sites appear stable no signs of hematoma on exam i.e. no discoloration   I have personally reviewed following labs and imaging studies  LABORATORY DATA: CBC: Recent Labs  Lab 05/06/20 0500 05/07/20 0430 05/09/20 0829 05/09/20 1124 05/10/20 0409  WBC 9.2 8.4 8.9 9.2 8.5  NEUTROABS  --   --   --   --  6.3  HGB 7.7* 7.5* 7.0* 7.1* 6.2*  HCT 24.3* 24.2* 22.8* 23.4* 20.4*  MCV 90.3 92.0 91.6 92.9 93.2  PLT 293 308 348 345 976    Basic Metabolic Panel: Recent Labs  Lab 05/04/20 0600  05/05/20 0403 05/09/20 0829 05/10/20 0409  NA 133* 136 135 132*  K 4.4 4.1 4.2 4.0  CL 97* 100 101 99  CO2 27 26 27 26   GLUCOSE 200* 172* 190* 183*  BUN 24* 20 15 16   CREATININE 1.38* 1.23 1.25* 1.31*  CALCIUM 8.1* 8.3* 8.4* 8.0*  MG  --   --  1.9 1.9    GFR: Estimated Creatinine Clearance: 62.2 mL/min (A) (by C-G formula based on SCr of 1.31 mg/dL (H)).  Liver Function Tests: Recent Labs  Lab 05/05/20 0403 05/10/20 0409  AST 25 22  ALT 41 33  ALKPHOS 75 74  BILITOT 0.5 0.5  PROT 4.7* 4.7*  ALBUMIN 1.8* 1.7*   No results for input(s): LIPASE, AMYLASE in the last 168 hours. No results for input(s): AMMONIA in the last 168 hours.  Coagulation Profile: No results for input(s): INR, PROTIME in the last 168 hours.  Cardiac Enzymes: No results for input(s): CKTOTAL, CKMB, CKMBINDEX, TROPONINI in the last 168 hours.  BNP (last 3 results) No results for input(s): PROBNP in the last 8760  hours.  Lipid Profile: No results for input(s): CHOL, HDL, LDLCALC, TRIG, CHOLHDL, LDLDIRECT in the last 72 hours.  Thyroid Function Tests: No results for input(s): TSH, T4TOTAL, FREET4, T3FREE, THYROIDAB in the last 72 hours.  Anemia Panel: No results for input(s): VITAMINB12, FOLATE, FERRITIN, TIBC, IRON, RETICCTPCT in the last 72 hours.  Urine analysis:    Component Value Date/Time   COLORURINE YELLOW 04/11/2020 2245   APPEARANCEUR CLEAR 04/11/2020 2245   LABSPEC 1.009 04/11/2020 2245   PHURINE 7.0 04/11/2020 2245   GLUCOSEU NEGATIVE 04/11/2020 2245   HGBUR NEGATIVE 04/11/2020 2245   BILIRUBINUR NEGATIVE 04/11/2020 2245   KETONESUR NEGATIVE 04/11/2020 2245   PROTEINUR NEGATIVE 04/11/2020 2245   NITRITE NEGATIVE 04/11/2020 2245   LEUKOCYTESUR NEGATIVE 04/11/2020 2245    Sepsis Labs: Lactic Acid, Venous    Component Value Date/Time   LATICACIDVEN 1.0 03/20/2020 1517    MICROBIOLOGY: Recent Results (from the past 240 hour(s))  Body fluid culture     Status: None   Collection Time: 04/30/20  5:28 PM   Specimen: Synovium; Body Fluid  Result Value Ref Range Status   Specimen Description SYNOVIAL LEFT KNEE  Final   Special Requests NONE  Final   Gram Stain   Final    ABUNDANT WBC PRESENT, PREDOMINANTLY PMN NO ORGANISMS SEEN    Culture   Final    NO GROWTH Performed at [facility], [address], [place], [place] [address]    Report Status 05/04/2020 FINAL  Final  Aerobic/Anaerobic Culture (surgical/deep wound)     Status: None   Collection Time: 05/02/20  8:55 AM   Specimen: Soft Tissue, Other  Result Value Ref Range Status   Specimen Description TISSUE  Final   Special Requests LEFT KNEE TISSUE SPEC A  Final   Gram Stain   Final    ABUNDANT WBC PRESENT, PREDOMINANTLY PMN NO ORGANISMS SEEN    Culture   Final    No growth aerobically or anaerobically. Performed at [facility], [place] [address], [place], [place] [address]    Report Status  05/07/2020 FINAL  Final  Aerobic/Anaerobic Culture (surgical/deep wound)     Status: None   Collection Time: 05/02/20  8:58 AM   Specimen: Soft Tissue, Other  Result Value Ref Range Status   Specimen Description TISSUE  Final   Special Requests RIGHT KNEE TISSUE SPEC B  Final   Gram Stain   Final    RARE WBC PRESENT,BOTH PMN AND MONONUCLEAR NO ORGANISMS SEEN    Culture   Final    No growth aerobically or anaerobically. Performed at [facility], [place] [address], [place], [place] [address]    Report Status 05/07/2020 FINAL  Final    RADIOLOGY STUDIES/RESULTS: DG Knee 1-2 Views Right  Result Date: 05/09/2020 CLINICAL DATA:  Swelling and pain. Recurrent bilateral septic arthritis. EXAM: RIGHT KNEE - 1-2 VIEW COMPARISON:  None. FINDINGS: Anterior skin staples are identified. Suggested bony erosion is seen along the lateral tibial plateau, apparently present on the MRI from April 30, 2020 but not appreciated on the x-ray from Mar 21, 2020. A similar but smaller region of possible erosion is seen along the medial tibial plateau, unchanged since the Mar 21, 2020 x-ray and the comparison MRI. Possible mild erosion along the lateral femoral epicondyle, possibly on the recent MRI as well. No other sites of erosion are identified. No fractures. A large suprapatellar joint effusion is identified, containing foci of air. Anterior soft tissue swelling is seen, associated with the skin staples. No other abnormalities. IMPRESSION: 1. Apparent bony erosion along the medial tibial plateau, not seen on the Mar 21, 2020 radiograph but apparently present on the April 30, 2020 MRI. Possible subtle erosion along the distal lateral femoral epicondyle as well, also possibly on the recent MRI. A small region of possible erosion along the medial tibial plateau is unchanged since Mar 21, 2020. These findings suggest osteomyelitis and are consistent with the recent known diagnosis of bilateral septic knees. The patient has  undergone recent surgery for septic knees. Recommend clinical correlation and attention on follow-up. 2. Large joint effusion containing foci of gas. These findings may simply be due to recent surgery. It would be difficult to exclude a superimposed infection on today's imaging. Electronically Signed   By: Dorise Bullion III M.D   On: 05/09/2020 12:15     LOS: 8 days   Lala Lund, MD  [facility]    To contact the attending provider between 7A-7P or the covering provider during after hours 7P-7A, please log into the web site [email or web address] and access using universal [place] password for that web site. If you do not have the password, please call the hospital operator.  05/10/2020, 10:37 AM

## 2020-05-10 NOTE — Progress Notes (Signed)
   05/10/20 0910  Assess: MEWS Score  Temp 98.2 F (36.8 C)  BP 93/71  Pulse Rate (!) 105  ECG Heart Rate (!) 105  Resp 18  Level of Consciousness Alert  SpO2 100 %  O2 Device Room Air  Assess: MEWS Score  MEWS Temp 0  MEWS Systolic 1  MEWS Pulse 1  MEWS RR 0  MEWS LOC 0  MEWS Score 2  MEWS Score Color Yellow  Assess: if the MEWS score is Yellow or Red  Were vital signs taken at a resting state? No  Focused Assessment Documented focused assessment  Early Detection of Sepsis Score *See Row Information* Low  MEWS guidelines implemented *See Row Information* Yes  Treat  MEWS Interventions Administered prn meds/treatments  Take Vital Signs  Increase Vital Sign Frequency  Yellow: Q 2hr X 2 then Q 4hr X 2, if remains yellow, continue Q 4hrs  Escalate  MEWS: Escalate Yellow: discuss with charge nurse/RN and consider discussing with provider and RRT  Notify: Charge Nurse/RN  Name of Charge Nurse/RN Notified [place]  Date Charge Nurse/RN Notified 05/10/20  Time Charge Nurse/RN Notified 0910  Document  Patient Outcome Stabilized after interventions  Progress note created (see row info) Yes   Patient is stable. No further interventions needed at this time.

## 2020-05-11 LAB — COMPREHENSIVE METABOLIC PANEL
ALT: 37 U/L (ref 0–44)
AST: 26 U/L (ref 15–41)
Albumin: 1.9 g/dL — ABNORMAL LOW (ref 3.5–5.0)
Alkaline Phosphatase: 89 U/L (ref 38–126)
Anion gap: 9 (ref 5–15)
BUN: 18 mg/dL (ref 8–23)
CO2: 26 mmol/L (ref 22–32)
Calcium: 8.3 mg/dL — ABNORMAL LOW (ref 8.9–10.3)
Chloride: 100 mmol/L (ref 98–111)
Creatinine, Ser: 1.29 mg/dL — ABNORMAL HIGH (ref 0.61–1.24)
GFR calc Af Amer: >60 mL/min (ref >60)
GFR calc non Af Amer: 57 mL/min — ABNORMAL LOW (ref >60)
Glucose, Bld: 130 mg/dL — ABNORMAL HIGH (ref 70–99)
Potassium: 4.1 mmol/L (ref 3.5–5.1)
Sodium: 135 mmol/L (ref 135–145)
Total Bilirubin: 1 mg/dL (ref 0.3–1.2)
Total Protein: 5.1 g/dL — ABNORMAL LOW (ref 6.5–8.1)

## 2020-05-11 LAB — TYPE AND SCREEN
ABO/RH(D): A POS
Antibody Screen: NEGATIVE
Unit division: 0
Unit division: 0

## 2020-05-11 LAB — MAGNESIUM: Magnesium: 2 mg/dL (ref 1.7–2.4)

## 2020-05-11 LAB — CBC WITH DIFFERENTIAL/PLATELET
Abs Immature Granulocytes: 0.12 10*3/uL — ABNORMAL HIGH (ref 0.00–0.07)
Basophils Absolute: 0.1 10*3/uL (ref 0.0–0.1)
Basophils Relative: 1 %
Eosinophils Absolute: 0.4 10*3/uL (ref 0.0–0.5)
Eosinophils Relative: 4 %
HCT: 26.7 % — ABNORMAL LOW (ref 39.0–52.0)
Hemoglobin: 8.4 g/dL — ABNORMAL LOW (ref 13.0–17.0)
Immature Granulocytes: 1 %
Lymphocytes Relative: 9 %
Lymphs Abs: 1 10*3/uL (ref 0.7–4.0)
MCH: 28.3 pg (ref 26.0–34.0)
MCHC: 31.5 g/dL (ref 30.0–36.0)
MCV: 89.9 fL (ref 80.0–100.0)
Monocytes Absolute: 0.8 10*3/uL (ref 0.1–1.0)
Monocytes Relative: 8 %
Neutro Abs: 8.5 10*3/uL — ABNORMAL HIGH (ref 1.7–7.7)
Neutrophils Relative %: 77 %
Platelets: 359 10*3/uL (ref 150–400)
RBC: 2.97 MIL/uL — ABNORMAL LOW (ref 4.22–5.81)
RDW: 18.3 % — ABNORMAL HIGH (ref 11.5–15.5)
WBC: 10.9 10*3/uL — ABNORMAL HIGH (ref 4.0–10.5)
nRBC: 0 % (ref 0.0–0.2)

## 2020-05-11 LAB — GLUCOSE, CAPILLARY
Glucose-Capillary: 121 mg/dL — ABNORMAL HIGH (ref 70–99)
Glucose-Capillary: 200 mg/dL — ABNORMAL HIGH (ref 70–99)
Glucose-Capillary: 156 mg/dL — ABNORMAL HIGH (ref 70–99)
Glucose-Capillary: 96 mg/dL (ref 70–99)

## 2020-05-11 LAB — BPAM RBC
Blood Product Expiration Date: 202107072359
Blood Product Expiration Date: 202107072359
ISSUE DATE / TIME: 202106200835
ISSUE DATE / TIME: 202106201151
Unit Type and Rh: 6200
Unit Type and Rh: 6200

## 2020-05-11 LAB — BRAIN NATRIURETIC PEPTIDE: B Natriuretic Peptide: 165.7 pg/mL — ABNORMAL HIGH (ref 0.0–100.0)

## 2020-05-11 NOTE — Progress Notes (Addendum)
Occupational Therapy Treatment Patient Details Name: Johnathan Murray MRN: [id number] DOB: [birth year] Today's Date: 05/11/2020    History of present illness Patient is a 69 y.o. 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  Patient was seen by cardiothoracic surgery-underwent debridement of aortic root abscess/aortic valve replacement with bioprosthetic valve 04/17/20 via median sternotomy-ID was consulted-patient was subsequently discharged to [facility] on 6/10.  While at CIR-patient started having gradually worsening anemia, bilateral knee pain/swelling-arthrocentesis was suggestive of septic knee-patient subsequently underwent irrigation/debridement and total synovectomy of bilateral knees-post procedure-patient was noted to have A. fib with RVR and severe anemia-and readmitted to the hospitalist service.  post op bleeding from left knee with acute blood loss anemia superimposed on anemia of chronic disease   OT comments  Patient supine in bed on arrival with wife in room for session.  Patient upset about recent setback of newer R knee pain (as of 6/18).  It very motivated to work hard in therapy though is limited by pain.  Moves slowly.  Able to get to EOB today with mod assist for torso to come upright, able to move legs off side of bed without assist.  Sat EOB 10 min in preparation for OOB ADLs.  Patient would benefit from CIR after discharge as he is motivated and was independent prior.  Will continue to follow with OT acutely to address the deficits listed  below.    Follow Up Recommendations  CIR;Supervision/Assistance - 24 hour    Equipment Recommendations  Other (comment) (defer to next venue)    Recommendations for Other Services      Precautions / Restrictions Precautions Precautions: Sternal;Fall;Other (comment) Precaution Comments: bleeding from left knee; esp if anticoagulated Required Braces or Orthoses: Spinal Brace Spinal Brace: Lumbar corset Other Brace: for comfort, pt reports "it doesn't really help me" so we didn't use back brace Restrictions Other Position/Activity Restrictions: sternal precautions       Mobility Bed Mobility Overal bed mobility: Needs Assistance Bed Mobility: Rolling;Sidelying to Sit;Sit to Sidelying Rolling: Mod assist (rail) Sidelying to sit: Mod assist (HOB elevated)     Sit to sidelying: Mod assist;+2 for safety/equipment General bed mobility comments: requires assist at his legs due to painful knees and at torso  Transfers                 General transfer comment: Not able to stand due to pain in knees.  Wife present during session    Balance Overall balance assessment: Needs assistance Sitting-balance support: Feet supported;Bilateral upper extremity supported Sitting balance-Leahy Scale: Fair Sitting balance - Comments: sat eOB for LE exercises and balance x 10 minutes                                   ADL either performed or assessed with clinical judgement   ADL Overall ADL's : Needs assistance/impaired                 Upper Body Dressing : Minimal assistance;Sitting  Functional mobility during ADLs: Minimal assistance General ADL Comments: Worked on seated EOB tolerance to prep for OOB ADLs     Vision       Perception     Praxis      Cognition Arousal/Alertness: Awake/alert Behavior During Therapy: WFL for tasks assessed/performed;Anxious Overall Cognitive Status: Within Functional Limits for tasks assessed                                          Exercises    Shoulder Instructions       General Comments Patient moves very slowly due to pain. Wife present during session, likes to be involved and help    Pertinent Vitals/ Pain       Pain Assessment: 0-10 Pain Score: 6  Faces Pain Scale:  (bil knees with weight-bearing) Pain Location: bil knee> back Pain Descriptors / Indicators: Grimacing;Guarding Pain Intervention(s): Limited activity within patient's tolerance;Premedicated before session;Repositioned  Home Living                                          Prior Functioning/Environment              Frequency  Min 2X/week        Progress Toward Goals  OT Goals(current goals can now be found in the care plan section)  Progress towards OT goals: Progressing toward goals  Acute Rehab OT Goals Patient Stated Goal: to get stronger and go back to work OT Goal Formulation: With patient Time For Goal Achievement: 05/18/20 Potential to Achieve Goals: Good  Plan Discharge plan remains appropriate;Frequency remains appropriate    Co-evaluation                 AM-PAC OT "6 Clicks" Daily Activity     Outcome Measure   Help from another person eating meals?: None Help from another person taking care of personal grooming?: A Little Help from another person toileting, which includes using toliet, bedpan, or urinal?: Total Help from another person bathing (including washing, rinsing, drying)?: A Lot Help from another person to put on and taking off regular upper body clothing?: A Little Help from another person to put on and taking off regular lower body clothing?: Total 6 Click Score: 14    End of Session    OT Visit Diagnosis: Muscle weakness (generalized) (M62.81);Other symptoms and signs involving cognitive function;Pain Pain - Right/Left:  (both) Pain - part of body: Knee   Activity Tolerance Patient limited by pain   Patient  Left in bed;with call bell/phone within reach;with family/visitor present   Nurse Communication Mobility status        Time: 2831-5176 OT Time Calculation (min): 21 min  Charges: OT General Charges $OT Visit: 1 Visit OT Treatments $Therapeutic Activity: 8-22 mins  August Luz, OTR/L    Phylliss Bob 05/11/2020, 2:47 PM

## 2020-05-11 NOTE — Progress Notes (Signed)
PROGRESS NOTE        PATIENT DETAILS Name: Johnathan Murray Age: 69 y.o. Sex: male Date of Birth: 16-Sep-1951 Admit Date: 05/02/2020 Admitting Physician Norval Morton, MD ZES:[id number], [name], MD  Brief Narrative: Patient is a 69 y.o. 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  Patient was seen by cardiothoracic surgery-underwent debridement of aortic root abscess/aortic valve replacement with bioprosthetic valve-ID was consulted-patient was subsequently discharged to CIR on 6/10.  While at CIR-patient started having gradually worsening anemia, bilateral knee pain/swelling-arthrocentesis was suggestive of septic knee-patient subsequently underwent irrigation/debridement and total synovectomy of bilateral knees-postoperatively-patient was noted to have A. fib with RVR and severe anemia due to blood loss from left knee bleeding-and readmitted to the hospitalist service.  See below for further details.  Significant events: 4/29-5/18>> admit by [facility] for sepsis secondary to Enterococcus faecalis, found to have acute osteo/discitis of L/-/5, left knee septic arthritis-discharge to CIR  5/18-5/24>> at CIR-aortic root abscess/prosthetic aortic valve vegetation on TEE (done for cardioversion) 5/24-6/10>> admit by [facility] from CIR-for aortic root replacement-discharged back to CIR. 6/10-6/12>> at CIR- recurrent bilateral knee septic arthritis on arthrocentesis-ortho reconsulted 6/12-current>> admit to [facility] for A. fib with RVR, severe  anemia following bilateral knee washout for septic arthritis.  Significant postoperative bleeding from left knee.  Significant studies: 4/29>> CT abdomen/pelvis: No acute intra-abdominal or pelvic pathology 4/29>> CT C-spine: Interval removal of fixating plate/screws from C4-C6, no acute osseous abnormality 4/30>>left lower extremity Doppler: No DVT, ruptured Baker's cyst 5/3>> MRI C-spine: No evidence of infection, s/p C3-4 discectomy/fusion. 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  Procedures : 6/12>> irrigation/debridement with total synovectomy of bilateral knees 6/9>> TEE guided cardioversion 5/27>>S/p Redo median sternotomy, rightaxillaryartery graftfor cardiopulmonary bypass, extracorporeal circulation, debridement of aortic root abscess and pseudoaneurysm, and aortic root replacement using a 62mm Homograft aortic root with reimplantation of coronary arteries. 5/24>> TEE guided cardioversion.  Found to have vegetation on prosthetic aortic valve on TEE. 5/18>> fluoroscopy guided needle aspiration of L4-L5 disc space 5/4>> B/L knee irrigation irrigation debridement arthroscopic. B/L knee medial and lateral menisectomies.  Antimicrobial therapy: Ampicillin: 5/18>> Rocephin: 5/19>>  Microbiology data: 6/12>> knee culture: No growth 6/10>> left knee synovial fluid culture: No  growth  5/27>> aortic valve tissue culture: No growth 5/26>> blood culture: No growth 5/22>> urine culture: No growth 5/19>> left synovitic fluid culture: No growth 5/18>> L4-L5 disc aspirate: Enterococcus faecalis 5/6>> blood culture: No growth 5/3>> blood culture: No growth 5/2>> right knee synovial fluid: No growth 5/1>> left knee synovial fluid: Enterococcus  faecalis 4/30>> blood culture: Enterococcus faecalis 4/29>> urine culture: No growth  Consults: ID [facility] Cardiology Ortho  DVT Prophylaxis : Eliquis  Subjective:  Patient in bed, appears comfortable, denies any headache, no fever, no chest pain or pressure, no shortness of breath , no abdominal pain. No focal weakness. Improved bilateral knee pain.  Assessment/Plan:  Hemorrhagic shock 2/2 to post op bleeding from left knee with acute blood loss anemia superimposed on anemia of chronic disease: Shock physiology has resolved-transfused a total of 5 units of PRBC this admission.  Left knee bleeding seems to have essentially resolved. Coagulation was cautiously started on 05/06/2020 however on 05/10/2020 he likely has bled again and heparin drip has been stopped.  Will transfuse another 2 units of packed RBCs on 05/10/2020.  Case has been discussed with Dr. Sharol Given orthopedics he is following the patient closely.  We will think for now seems to have resolved on 05/11/2021 will monitor closely.  For now continue to hold anticoagulation.   A. fib with RVR with [place] vas 2 score of greater than 3: Rate controlled but remains in atrial fibrillation-continue amiodarone and low-dose beta-blocker.  Cardiology following, anticoagulation as above.  Patient and family understand the risks and benefits of holding anticoagulation or using it.  Enterococcus bacteremia-with L4-L5 discitis, bilateral septic knee, prosthetic valve vegetation with aortic root abscess: Evaluated by infectious disease-recommendations are to continue ceftriaxone/ampicillin  till 05/20/2020-and then to start p.o. amoxicillin 500 mg twice daily from 05/21/2020.  Appointment made with Dr. Megan Salon on 06/03/2020 at 2:45 PM.    CAD-s/p CABG-no anginal symptoms-continue supportive care  Hypothyroidism: Continue levothyroxine  DM-2 with uncontrolled hyperglycemia: CBGs relatively stable-continue Lantus 10 units twice daily, SSI.  Follow and adjust.     Recent Labs    05/10/20 2128 05/11/20 0742 05/11/20 1145  GLUCAP 111* 121* 200*   Debility/deconditioning: Secondary to acute illness-we will need PT/OT-plans are to discharge back to CIR once insurance authorization/bed available..   Diet: Diet Order            Diet Carb Modified Fluid consistency: Thin; Room service appropriate? Yes  Diet effective now                Code Status: Full code   Family Communication: Spouse at bedside on 05/09/2020  Disposition Plan: Status is: Inpatient  Remains inpatient appropriate because:Inpatient level of care appropriate due to severity of illness  Dispo: The patient is from: Home              Anticipated d/c is to: CIR              Anticipated d/c date is: > 1 days              Patient currently is  medically stable to d/c.   Barriers to Discharge: Awaiting CIR bed/insurance authorization  Antimicrobial agents: Anti-infectives (From admission, onward)   Start     Dose/Rate Route Frequency Ordered Stop   05/21/20 1000  amoxicillin (AMOXIL) capsule 500 mg     Discontinue     500 mg Oral Every 12 hours 05/06/20 1103     05/21/20 0000  amoxicillin (AMOXIL) 500 MG capsule     Discontinue    Note to Pharmacy: Put on file. Patient going to CIR prior to discharge   500 mg Oral 2 times daily 05/06/20 1109 09/18/20 2359   05/03/20 0500  cefTRIAXone (ROCEPHIN) 2 g in sodium chloride 0.9 % 100 mL IVPB  Discontinue     2 g 200 mL/hr over 30 Minutes Intravenous Every 12 hours 05/02/20 2155 05/20/20 2359   05/02/20 2245  cefTRIAXone (ROCEPHIN) 2 g in sodium chloride  0.9 % 100 mL IVPB  Status:  Discontinued        2 g 200 mL/hr over 30 Minutes Intravenous Every 12 hours 05/02/20 1943 05/02/20 2155   05/02/20 1800  ampicillin (OMNIPEN) 2 g in sodium chloride 0.9 % 100 mL IVPB     Discontinue     2 g 300 mL/hr over 20 Minutes Intravenous Every 4 hours 05/02/20 1745 05/20/20 2359       Time spent: 25 minutes-Greater than 50% of this time was spent in counseling, explanation of diagnosis, planning of further management, and coordination of care.  MEDICATIONS: Scheduled Meds: . amiodarone  200 mg Oral BID   Followed by  . [START ON 05/24/2020] amiodarone  200 mg Oral Daily  . [START ON 05/21/2020] amoxicillin  500 mg Oral Q12H  . Chlorhexidine Gluconate Cloth  6 each Topical Daily  . feeding supplement (ENSURE ENLIVE)  237 mL Oral TID BM  . ferrous [id number] C-folic acid  1 capsule Oral Q breakfast  . insulin aspart  0-20 Units Subcutaneous TID WC  . insulin aspart  0-5 Units Subcutaneous QHS  . insulin glargine  10 Units Subcutaneous BID  . levothyroxine  25 mcg Oral QAC breakfast  . metoprolol tartrate  12.5 mg Oral BID  . oxyCODONE  10 mg Oral Q12H  . polyethylene glycol  17 g Oral Daily  . pregabalin  100 mg Oral TID  . senna  1 tablet Oral QHS  . sodium chloride flush  10-40 mL Intracatheter Q12H   Continuous Infusions: . ampicillin (OMNIPEN) IV 2 g (05/11/20 2376)  . cefTRIAXone (ROCEPHIN) IVPB 2 gram/100 mL NS (Mini-Bag Plus) 2 g (05/11/20 0526)   PRN Meds:.acetaminophen **OR** acetaminophen, albuterol, cyclobenzaprine, morphine injection, oxyCODONE   PHYSICAL EXAM: Vital signs: Vitals:   05/11/20 0410 05/11/20 0444 05/11/20 0700 05/11/20 0803  BP:    96/67  Pulse:      Resp:   19   Temp: 99.4 F (37.4 C)   98.4 F (36.9 C)  TempSrc: Oral   Oral  SpO2:      Weight:  91.5 kg    Height:       Filed Weights   05/09/20 0437 05/09/20 0900 05/11/20 0444  Weight: 91.6 kg 91.6 kg 91.5 kg   Body mass index is 28.94  kg/m.   Gen Exam:  Awake Alert, No new F.N deficits, Normal affect [place].AT,PERRAL Supple Neck,No JVD, No cervical lymphadenopathy appriciated.  Symmetrical Chest wall movement, Good air movement bilaterally, CTAB RRR,No Gallops, +ve  heart murmur, No Parasternal Heave +ve B.Sounds, Abd Soft, No tenderness, No organomegaly appriciated, No rebound - guarding or rigidity. bilateral moderate knee swelling, minimal bleeding around the left knee incision site, both incision sites appear stable no signs of hematoma on exam i.e. no discoloration   I have personally reviewed following labs and imaging studies  LABORATORY DATA: CBC: Recent Labs  Lab 05/07/20 0430 05/07/20 0430 05/09/20 0829 05/09/20 1124 05/10/20 0409 05/10/20 2155 05/11/20 0415  WBC 8.4  --  8.9 9.2 8.5  --  10.9*  NEUTROABS  --   --   --   --  6.3  --  8.5*  HGB 7.5*   < > 7.0* 7.1* 6.2* 8.5* 8.4*  HCT 24.2*   < > 22.8*  23.4* 20.4* 27.0* 26.7*  MCV 92.0  --  91.6 92.9 93.2  --  89.9  PLT 308  --  348 345 324  --  359   < > = values in this interval not displayed.    Basic Metabolic Panel: Recent Labs  Lab 05/05/20 0403 05/09/20 0829 05/10/20 0409 05/11/20 0415  NA 136 135 132* 135  K 4.1 4.2 4.0 4.1  CL 100 101 99 100  CO2 26 27 26 26   GLUCOSE 172* 190* 183* 130*  BUN 20 15 16 18   CREATININE 1.23 1.25* 1.31* 1.29*  CALCIUM 8.3* 8.4* 8.0* 8.3*  MG  --  1.9 1.9 2.0    GFR: Estimated Creatinine Clearance: 63.2 mL/min (A) (by C-G formula based on SCr of 1.29 mg/dL (H)).  Liver Function Tests: Recent Labs  Lab 05/05/20 0403 05/10/20 0409 05/11/20 0415  AST 25 22 26   ALT 41 33 37  ALKPHOS 75 74 89  BILITOT 0.5 0.5 1.0  PROT 4.7* 4.7* 5.1*  ALBUMIN 1.8* 1.7* 1.9*   No results for input(s): LIPASE, AMYLASE in the last 168 hours. No results for input(s): AMMONIA in the last 168 hours.  Coagulation Profile: No results for input(s): INR, PROTIME in the last 168 hours.  Cardiac Enzymes: No  results for input(s): CKTOTAL, CKMB, CKMBINDEX, TROPONINI in the last 168 hours.  BNP (last 3 results) No results for input(s): PROBNP in the last 8760 hours.  Lipid Profile: No results for input(s): CHOL, HDL, LDLCALC, TRIG, CHOLHDL, LDLDIRECT in the last 72 hours.  Thyroid Function Tests: No results for input(s): TSH, T4TOTAL, FREET4, T3FREE, THYROIDAB in the last 72 hours.  Anemia Panel: No results for input(s): VITAMINB12, FOLATE, FERRITIN, TIBC, IRON, RETICCTPCT in the last 72 hours.  Urine analysis:    Component Value Date/Time   COLORURINE YELLOW 04/11/2020 2245   APPEARANCEUR CLEAR 04/11/2020 2245   LABSPEC 1.009 04/11/2020 2245   PHURINE 7.0 04/11/2020 2245   GLUCOSEU NEGATIVE 04/11/2020 2245   HGBUR NEGATIVE 04/11/2020 2245   BILIRUBINUR NEGATIVE 04/11/2020 2245   KETONESUR NEGATIVE 04/11/2020 2245   PROTEINUR NEGATIVE 04/11/2020 2245   NITRITE NEGATIVE 04/11/2020 2245   LEUKOCYTESUR NEGATIVE 04/11/2020 2245    Sepsis Labs: Lactic Acid, Venous    Component Value Date/Time   LATICACIDVEN 1.0 03/20/2020 1517    MICROBIOLOGY: Recent Results (from the past 240 hour(s))  Aerobic/Anaerobic Culture (surgical/deep wound)     Status: None   Collection Time: 05/02/20  8:55 AM   Specimen: Soft Tissue, Other  Result Value Ref Range Status   Specimen Description TISSUE  Final   Special Requests LEFT KNEE TISSUE SPEC A  Final   Gram Stain   Final    ABUNDANT WBC PRESENT, PREDOMINANTLY PMN NO ORGANISMS SEEN    Culture   Final    No growth aerobically or anaerobically. Performed at [facility], [place] [address] [place], [place] [address]    Report Status 05/07/2020 FINAL  Final  Aerobic/Anaerobic Culture (surgical/deep wound)     Status: None   Collection Time: 05/02/20  8:58 AM   Specimen: Soft Tissue, Other  Result Value Ref Range Status   Specimen Description TISSUE  Final   Special Requests RIGHT KNEE TISSUE SPEC B  Final   Gram Stain   Final    RARE  WBC PRESENT,BOTH PMN AND MONONUCLEAR NO ORGANISMS SEEN    Culture   Final    No growth aerobically or anaerobically. Performed at [facility], [place] [address] [place], [place] [address]    Report Status 05/07/2020 FINAL  Final    RADIOLOGY STUDIES/RESULTS: No results found.   LOS: 9 days   Lala Lund, MD  [facility]    To contact the attending provider between 7A-7P or the covering provider during after hours 7P-7A, please log into the web site [email or web address] and access using universal [place] password for that web site. If you do not have the password, please call the hospital operator.  05/11/2020, 11:51 AM

## 2020-05-11 NOTE — Progress Notes (Signed)
Patient ID: Johnathan Murray, male   DOB: 20-Aug-1951, 69 y.o.   MRN: [id number] Patient is status post synovectomy both knees.  Cultures are still negative.  Both incisions are clean and dry no drainage.  Patient states that he can move his knees better this morning that he could yesterday he feels like the swelling has decreased.  Minimal effusion right knee moderate effusion left knee, patient's hemoglobin is stable, no clinical signs of active bleeding in either knee.  There is minimal swelling in both legs no tenderness to palpation.  Patient's knees are stable, ok to resume anticoagulation therapy.

## 2020-05-11 NOTE — Progress Notes (Signed)
Physical Therapy Treatment Patient Details Name: Johnathan Murray MRN: [id number] DOB: 26-Feb-1951 Today's Date: 05/11/2020    History of Present Illness Patient is a 69 y.o. 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  Patient was seen by cardiothoracic surgery-underwent debridement of aortic root abscess/aortic valve replacement with bioprosthetic valve 04/17/20 via median sternotomy-ID was consulted-patient was subsequently discharged to [facility] on 6/10.  While at CIR-patient started having gradually worsening anemia, bilateral knee pain/swelling-arthrocentesis was suggestive of septic knee-patient subsequently underwent irrigation/debridement and total synovectomy of bilateral knees-post procedure-patient was noted to have A. fib with RVR and severe anemia-and readmitted to the hospitalist service.  post op bleeding from left knee with acute blood loss anemia superimposed on anemia of chronic disease    PT Comments    Patient/wife report significant increase in bil knee pain (although rt worst) after PT session on 05/08/20. Wife reports pt had to use morphine throughout the weekend due to severity of pain and would not tolerate her performing exercises with his legs. She reports he also had bleeding from left knee again (Dr. Sharol Given, orthopedist, has already re-evaluated). Currently anticoagulation is being held but to restart today. Patient able to perform gentle AAROM with bil LEs and progress to sitting at EOB (he was not able to do either of these things for  the past 2 days per wife). Brought [employer] lift equipment to room for attempting standing and pt unable to bend his knees enough to be able to properly place feet on the foot plate. (AAROM bil knees to ~45-50 degrees in sitting with bed elevated). Patient remains very motivated to walk again, however disappointed by yet another setback.     Follow Up Recommendations  CIR     Equipment Recommendations  None recommended by PT    Recommendations for Other Services       Precautions / Restrictions Precautions Precautions: Sternal;Fall;Other (comment) Precaution Comments: bleeding from left knee; esp if anticoagulated Required Braces or Orthoses: Spinal Brace Spinal Brace: Lumbar corset Other Brace: for comfort, pt reports "it doesn't really help me" so we didn't use back brace Restrictions Other Position/Activity Restrictions: sternal precautions    Mobility  Bed Mobility Overal bed mobility: Needs Assistance Bed Mobility: Rolling;Sidelying to Sit;Sit to Sidelying Rolling: Mod assist (with rail; each direction x 2) Sidelying to sit: Mod assist;+2 for physical assistance;HOB elevated (with rail)     Sit to sidelying: Mod assist;+2 for safety/equipment General bed mobility comments: requires assist at his legs due to painful knees and at torso  Transfers                 General transfer comment: Even with bed elevated with hips~6" higher than knees, pt unable to flex knees to 90 degrees to allow attempt to weightbear on bil LEs  Ambulation/Gait                 Stairs             Wheelchair Mobility    Modified Rankin (Stroke Patients Only)       Balance Overall balance assessment: Needs assistance Sitting-balance support: Feet supported;Bilateral upper extremity  supported Sitting balance-Leahy Scale: Fair Sitting balance - Comments: sat eOB for LE exercises and balance x 10 minutes                                    Cognition  Arousal/Alertness: Awake/alert Behavior During Therapy: Anxious (re: anticipated pain)                                   General Comments: cognition NT      Exercises General Exercises - Lower Extremity Ankle Circles/Pumps: AROM;Both;10 reps;Supine Quad Sets: AROM;Both;Supine;5 reps Gluteal Sets: AROM;Both;5 reps;Supine Long Arc Quad: Both;Seated;Other reps (comment);AAROM (3 reps) Heel Slides: Both;AAROM;Seated;10 reps (working on knee flexion) Hip Flexion/Marching:  (attempted on rt in sitting with incr back pain) Other Exercises Other Exercises: bil LE internal rotation towards neutral (pt rests in external rotation)    General Comments General comments (skin integrity, edema, etc.): Increased time for all mobility/exercises due to pain. Wife present. Both report patient had an awful weekend due to incr bil knee pain after increasing mobility with PT on Friday. Wife reports pt had to use morphine throughout the weekend due to severity of pain and would not tolerate her performing exercises with his legs. She reports he also had bleeding from left knee again      Pertinent Vitals/Pain Pain Assessment: 0-10 Pain Score: 6  Faces Pain Scale:  (bil knees with weight-bearing) Pain Location: bil knee> back Pain Descriptors / Indicators: Grimacing;Guarding Pain Intervention(s): Limited activity within patient's tolerance;Monitored during session;Premedicated before session    Home Living                      Prior Function            PT Goals (current goals can now be found in the care plan section) Acute Rehab PT Goals Patient Stated Goal: to get stronger and go back to work Time For Goal Achievement: 05/17/20 Potential to Achieve Goals: Good Progress towards PT goals: Not progressing toward goals - comment (set back due to incr pain/stiffness)    Frequency    Min 4X/week      PT Plan Current plan remains appropriate    Co-evaluation               AM-PAC PT "6 Clicks" Mobility   Outcome Measure  Help needed turning from your back to your side while in a flat bed without using bedrails?: A Lot Help needed moving from lying on your back to sitting on the side of a flat bed without using bedrails?: A Lot Help needed moving to and from a bed to a chair (including a wheelchair)?: Total Help needed standing up from a chair using your arms (e.g., wheelchair or bedside chair)?: Total Help needed to walk in hospital room?: Total Help needed climbing 3-5 steps with a railing? : Total 6 Click Score: 8    End of Session Equipment Utilized During Treatment: Other (comment) (bed pad) Activity Tolerance: Patient limited by pain Patient left: in bed;with call bell/phone within reach;with family/visitor present   PT Visit Diagnosis: Other abnormalities of gait and mobility (R26.89);Muscle weakness (generalized) (M62.81);Pain Pain - Right/Left:  (both) Pain - part of body: Knee (and back)     Time: 6967-8938 PT Time Calculation (min) (ACUTE ONLY): 38 min  Charges:  $Therapeutic Exercise:  8-22 mins $Therapeutic Activity: 23-37 mins                      Arby Barrette, PT Pager [phone]    Rexanne Mano 05/11/2020, 1:17 PM

## 2020-05-11 NOTE — Discharge Summary (Signed)
Physician Discharge Summary  Patient ID: Johnathan Murray MRN: [id number] DOB/AGE: 12/17/1950 69 y.o.  Admit date: 04/30/2020 Discharge date: 05/02/2020  Discharge Diagnoses:  Principal Problem:   Bacteremia due to Enterococcus Active Problems:   Diabetes mellitus due to underlying condition with unspecified complications (HCC)   Dyslipidemia   PAF (paroxysmal atrial fibrillation) (HCC)   S/P AVR   Septic arthritis of knee, bilateral (HCC)   Prosthetic valve endocarditis (HCC)   Acute blood loss anemia   Prolonged QT interval   Transient hypotension   Lumbar discitis Bilateral knee effusion with history of left knee sepsis  Discharged Condition: Stable  Significant Diagnostic Studies: DG Chest 2 View  Result Date: 04/26/2020 CLINICAL DATA:  Endocarditis. EXAM: CHEST - 2 VIEW COMPARISON:  04/18/2020 FINDINGS: Patient has RIGHT-sided PICC line, tip overlying the superior vena cava. Status post median sternotomy. The heart is enlarged and stable in configuration. Stable prominence of the RIGHT first costochondral junction. No focal consolidations or pulmonary edema. Small bilateral pleural effusions are present. IMPRESSION: Stable cardiomegaly. Small effusions. Electronically Signed   By: Nolon Nations M.D.   On: 04/26/2020 08:22   DG Knee 1-2 Views Right  Result Date: 05/09/2020 CLINICAL DATA:  Swelling and pain. Recurrent bilateral septic arthritis. EXAM: RIGHT KNEE - 1-2 VIEW COMPARISON:  None. FINDINGS: Anterior skin staples are identified. Suggested bony erosion is seen along the lateral tibial plateau, apparently present on the MRI from April 30, 2020 but not appreciated on the x-ray from Mar 21, 2020. A similar but smaller region of possible erosion is seen along the medial tibial plateau, unchanged since the Mar 21, 2020 x-ray and the comparison MRI. Possible mild erosion along the lateral femoral epicondyle, possibly on the recent MRI as well. No other sites of erosion are  identified. No fractures. A large suprapatellar joint effusion is identified, containing foci of air. Anterior soft tissue swelling is seen, associated with the skin staples. No other abnormalities. IMPRESSION: 1. Apparent bony erosion along the medial tibial plateau, not seen on the Mar 21, 2020 radiograph but apparently present on the April 30, 2020 MRI. Possible subtle erosion along the distal lateral femoral epicondyle as well, also possibly on the recent MRI. A small region of possible erosion along the medial tibial plateau is unchanged since Mar 21, 2020. These findings suggest osteomyelitis and are consistent with the recent known diagnosis of bilateral septic knees. The patient has undergone recent surgery for septic knees. Recommend clinical correlation and attention on follow-up. 2. Large joint effusion containing foci of gas. These findings may simply be due to recent surgery. It would be difficult to exclude a superimposed infection on today's imaging. Electronically Signed   By: Dorise Bullion III M.D   On: 05/09/2020 12:15   MR KNEE RIGHT WO CONTRAST  Result Date: 05/01/2020 CLINICAL DATA:  Bilateral knee pain and swelling. Previous bilateral knee irrigation and debridement on 03/22/2020. Bilateral knee aspiration today. Concern for septic arthritis. EXAM: MRI OF THE RIGHT KNEE WITHOUT CONTRAST TECHNIQUE: Multiplanar, multisequence MR imaging of the knee was performed. No intravenous contrast was administered. COMPARISON:  None. FINDINGS: MENISCI Medial meniscus:  Intact with normal morphology. Lateral meniscus: Mild degeneration of the anterior horn. No evidence of meniscal tear. LIGAMENTS Cruciates: Severe ACL mucoid degeneration without evidence of acute ligamentous injury. The PCL is intact. Collaterals:  Intact. CARTILAGE Patellofemoral: Severe patellofemoral degenerative changes with diffuse chondral thinning and osteophyte formation. Medial: Mild chondral thinning with peripheral osteophytes  and subchondral edema. Lateral:  Moderate chondral thinning with surface irregularity, osteophytes and peripheral subchondral edema in the lateral femoral condyle. There is a small peripheral osteochondral lesion which appears chronic. No evidence of erosive disease. MISCELLANEOUS Joint: Moderate to large complex joint effusion with heterogeneous signal, most consistent with synovitis. Popliteal Fossa: There is complex fluid within the popliteus hiatus. No significant Baker's cyst. Extensor Mechanism:  Intact. Bones: No evidence of acute fracture or dislocation. Scattered arthropathic changes as described without bone destruction or generalized periarticular edema to suggest septic arthritis. Other: Subcutaneous edema posterolaterally. No focal fluid collection. IMPRESSION: 1. Moderate to large complex joint effusion with heterogeneous signal, most consistent with synovitis. Associated complex fluid in the popliteus hiatus. 2. No specific evidence of osteomyelitis. 3. Tricompartmental degenerative changes, severe at the patellofemoral compartment. No acute osseous findings. 4. Severe ACL mucoid degeneration without evidence of acute ligamentous injury. Electronically Signed   By: Richardean Sale M.D.   On: 05/01/2020 08:15   MR KNEE LEFT WO CONTRAST  Result Date: 05/01/2020 CLINICAL DATA:  Bilateral knee pain and swelling. Previous bilateral knee irrigation and debridement on 03/22/2020. Bilateral knee aspiration today. Concern for septic arthritis. EXAM: MRI OF THE LEFT KNEE WITHOUT CONTRAST TECHNIQUE: Multiplanar, multisequence MR imaging of the knee was performed. No intravenous contrast was administered. COMPARISON:  Radiographs 03/21/2020 FINDINGS: MENISCI Medial meniscus: Irregular degenerative tear of the posterior horn, involving the meniscal root. There is resulting peripheral extrusion of the meniscus from the joint space. No centrally displaced meniscal fragment identified. Lateral meniscus: There is a  radial tear of the posterior horn, best seen on the coronal images. No centrally displaced meniscal fragment. The meniscus is partially extruded peripherally from the joint. LIGAMENTS Cruciates: Poorly defined anterior cruciate ligament, likely due to advanced ACL mucoid degeneration. Integrity of the ligament is difficult to confirm. There is also probable mucoid degeneration of the PCL. No acute ligamentous findings. Collaterals: Intact. Moderate fluid in the medial collateral ligament bursa. CARTILAGE Patellofemoral: Advanced patellofemoral arthropathy with diffuse joint space narrowing and osteophytes. Medial: Diffuse chondral thinning, surface irregularity and osteophyte formation. Scattered nonspecific edema and subchondral cyst formation without focal erosions or bone destruction. Lateral: Diffuse chondral thinning, surface irregularity and osteophyte formation. Scattered nonspecific subchondral cyst formation and edema in the lateral femoral condyle. No bone destruction. MISCELLANEOUS Joint: Large complex joint effusion with diffuse synovial irregularity suspicious for synovitis. Popliteal Fossa: There is complex fluid in the popliteus hiatus. There is a small Baker's cyst. Extensor Mechanism:  Intact. Bones: Nonspecific marrow changes as described above, consistent with underlying arthropathy/synovitis. No bone destruction or generalized periarticular edema to suggest septic arthritis or osteomyelitis. Other: Mild soft tissue edema posterolaterally without other focal fluid collection. IMPRESSION: 1. Large complex joint effusion with diffuse synovial irregularity suspicious for synovitis, nonspecific in etiology. Based on prior radiographs and aspiration results, findings could be secondary to CPPD arthropathy. 2. No specific evidence of septic arthritis or osteomyelitis. 3. Complex degenerative tear of the posterior horn of the medial meniscus, involving the meniscal root. Radial tear of the posterior horn  of the lateral meniscus. 4. Advanced ACL mucoid degeneration and probable mucoid degeneration of the PCL. 5. Moderate medial collateral ligament bursitis. Electronically Signed   By: Richardean Sale M.D.   On: 05/01/2020 08:24   CT CORONARY MORPH W/CTA COR W/SCORE W/CA W/CM &/OR WO/CM  Addendum Date: 04/14/2020   ADDENDUM REPORT: 04/14/2020 20:07 CLINICAL DATA:  Endocarditis/Aortic root abscess/Prosthetic aortic valve. EXAM: Cardiac TAVR CT TECHNIQUE: The patient was scanned on a [employer]  Force scanner. A 120 kV retrospective scan was triggered in the descending thoracic aorta at 111 HU's. Gantry rotation speed was 250 msecs and collimation was .6 mm. No beta blockade or nitro were given. The 3D data set was reconstructed in 5% intervals of the R-R cycle. Systolic and diastolic phases were analyzed on a dedicated work station using MPR, MIP and VRT modes. The patient received 80 cc of contrast. FINDINGS: Image quality: Average. Noise artifact is: There is cardiac motion (PVC) and respiratory artifact. Aortic Valve: There is a 23 mm Magna ease bioprosthetic valve present in the aortic position. The leaflet of the LCC appears thickened with likely vegetation. 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 There is a fistulous connection between the pseudoaneurysm and the LVOT anterior to the RCC of the aortic root. Coronary Arteries: Normal coronary origin. Right dominance. CAC score not performed due to prior CABG. Please refer to recent cardiac cath for definitive assessment of coronary arteries and bypass grafts. Bypass Grafts: A patent LIMA to mid LAD is seen. No other vein grafts observed. At least one is occluded, likely 2, on the anterior aspect of the aorta. Left main: The left main is a large caliber vessel with a normal take off from the left coronary cusp that bifurcates to form  a left anterior descending artery and a left circumflex artery. There is minimal calcified plaque (<25%). Left anterior descending artery: The LAD is 100% occluded proximally. Left circumflex artery: The LCX is non-dominant. The proximal LCX contains mild calcified plaque (25-49%). Distal evaluation is limited by artifact. Right coronary artery: The RCA is dominant with normal take off from the right coronary cusp. There is mild to moderate heavily calcified plaque throughout. Vessel severity is unable to be determined. The RCA terminates as PDA and PLV branches with heavily calcified plaque. Cardiac Morphology: Right Atrium: Right atrial size is within normal limits. Right Ventricle: The right ventricular cavity is within normal limits. Left Atrium: Left atrial size is normal in size with no left atrial appendage filling defect. Left Ventricle: The ventricular cavity size is within normal limits. Pulmonary arteries: Normal in size without proximal filling defect. Pulmonary veins: Normal pulmonary venous drainage. Pericardium: Normal thickness with no significant effusion or calcium present. Mitral Valve: The mitral valve is normal structure without significant calcification. Extra-cardiac findings: See attached radiology report for non-cardiac structures. IMPRESSION: 1. Prosthetic valve (23 mm Magna ease) endocarditis with leaflet thickening concerning for vegetation. 2. 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 3. Fistulous connection between the pseudoaneurysm and LVOT described above. 3. 3-vessel CAD with patent LIMA-LAD. [place] T. Audie Box, MD Electronically Signed   By: Eleonore Chiquito   On: 04/14/2020 20:07   Result Date: 04/14/2020 EXAM: OVER-READ INTERPRETATION  CT CHEST The following report is an over-read performed by radiologist Dr. Vinnie Langton of [facility], [place] on 04/14/2020. This over-read does not include interpretation of cardiac or  coronary anatomy or pathology. The coronary calcium score/coronary CTA interpretation by the cardiologist is attached. COMPARISON:  None. FINDINGS: Within the visualized portions of the thorax there are no suspicious appearing pulmonary nodules or masses, there is no acute consolidative airspace disease, no pleural effusions, no pneumothorax and no lymphadenopathy. Visualized portions of the upper abdomen are unremarkable. There are no aggressive appearing lytic  or blastic lesions noted in the visualized portions of the skeleton. Status post median sternotomy for aortic valve replacement. IMPRESSION: No significant incidental noncardiac findings are noted. Electronically Signed: By: Vinnie Langton M.D. On: 04/14/2020 13:26   DG Chest Port 1 View  Result Date: 04/18/2020 CLINICAL DATA:  Status post aortic valve repair EXAM: PORTABLE CHEST 1 VIEW COMPARISON:  04/17/2020 FINDINGS: Swan-Ganz catheter has been removed in the interval. Right jugular sheath remains in place. Cardiac shadow is stable. Pericardial drain and mediastinal drain are again seen and stable. The lungs are well aerated without pneumothorax. No focal infiltrate is seen. No bony abnormality is noted. IMPRESSION: Tubes and lines as described above. No acute infiltrate is seen. Electronically Signed   By: Inez Catalina M.D.   On: 04/18/2020 09:19   DG Chest Port 1 View  Result Date: 04/17/2020 CLINICAL DATA:  Status post aortic valve repair EXAM: PORTABLE CHEST 1 VIEW COMPARISON:  04/16/2020 FINDINGS: Endotracheal tube and gastric catheter have been removed in the interval. Mediastinal drain and pericardial drain remain in place as does a Swan-Ganz catheter. Lungs are hypoinflated with mild basilar atelectasis. No pneumothorax is seen. No bony abnormality is noted. IMPRESSION: Tubes and lines as described above. Poor inspiratory effort with mild bibasilar atelectasis. Electronically Signed   By: Inez Catalina M.D.   On: 04/17/2020 08:46   DG  Chest Port 1 View  Result Date: 04/16/2020 CLINICAL DATA:  Status post aortic valve repair. EXAM: PORTABLE CHEST 1 VIEW COMPARISON:  Apr 16, 2020. FINDINGS: Stable cardiomegaly. Endotracheal and nasogastric tubes are in grossly good position. Right internal jugular catheter is noted with distal tip in expected position of main pulmonary artery. No pneumothorax or significant pleural effusion is noted. Minimal bibasilar subsegmental atelectasis is noted. Bony thorax is unremarkable. IMPRESSION: Minimal bibasilar subsegmental atelectasis. Endotracheal and nasogastric tubes are in grossly good position. No pneumothorax is noted. Electronically Signed   By: Marijo Conception M.D.   On: 04/16/2020 19:58   DG CHEST PORT 1 VIEW  Result Date: 04/16/2020 CLINICAL DATA:  Preop. EXAM: PORTABLE CHEST 1 VIEW COMPARISON:  Radiograph 03/20/2020 FINDINGS: Post median sternotomy. Prosthetic aortic valve. Mild cardiomegaly with normal mediastinal contours. No pulmonary edema, focal airspace disease, pleural effusion or pneumothorax. No acute osseous abnormalities are seen. IMPRESSION: Mild cardiomegaly without acute abnormality. Electronically Signed   By: Keith Rake M.D.   On: 04/16/2020 03:42   ECHO TEE  Result Date: 04/29/2020    TRANSESOPHOGEAL ECHO REPORT   Patient Name:   Johnathan Murray Royce Date of Exam: 04/29/2020 Medical Rec #:  [id number]           Height:       70.0 in Accession #:    [id number]          Weight:       177.7 lb Date of Birth:  1950-12-06           BSA:          1.985 m Patient Age:    81 years            BP:           76/37 mmHg Patient Gender: M                   HR:           68 bpm. Exam Location:  Inpatient Procedure: Transesophageal Echo, Cardiac Doppler and Color Doppler Indications:     Atrial fibrillation  427.31 / I48.91  History:         Patient has prior history of Echocardiogram examinations, most                  recent 04/16/2020. CAD, Aortic Valve Disease, Arrythmias:Atrial                   Fibrillation and Atrial Flutter, Signs/Symptoms:Bacteremia;                  Risk Factors:Hypertension, Dyslipidemia and Non-Smoker.                  Aortic Valve: 23 mm Homograft valve/aortic root valve is                  present in the aortic position. Procedure Date: 04/16/2020.  Sonographer:     Vickie Epley RDCS Referring Phys:  [id number] Gaye Pollack Diagnosing Phys: Skeet Latch MD PROCEDURE: After discussion of the risks and benefits of a TEE, an informed consent was obtained from the patient. The transesophogeal probe was passed without difficulty through the esophogus of the patient. Sedation performed by different physician. The patient was monitored while under deep sedation. Anesthestetic sedation was provided intravenously by Anesthesiology: 317.97mg  of Propofol. Image quality was excellent. The patient's vital signs; including heart rate, blood pressure, and oxygen saturation; remained stable throughout the procedure. The patient developed no complications during the procedure. A successful direct current cardioversion was performed at 200 joules with 1 attempt. IMPRESSIONS  1. Left ventricular ejection fraction, by estimation, is 55 to 60%. The left ventricle has normal function. The left ventricle has no regional wall motion abnormalities.  2. Right ventricular systolic function is mildly reduced. The right ventricular size is normal.  3. No left atrial/left atrial appendage thrombus was detected. The LAA emptying velocity was 50 cm/s.  4. Multiple regurgitant jets each with mild regurgitation. The mitral valve is normal in structure. Mild to moderate mitral valve regurgitation. No evidence of mitral stenosis.  5. Tricuspid valve regurgitation is mild to moderate.  6. The aortic valve has been repaired/replaced. Aortic valve regurgitation is trivial. No aortic stenosis is present. There is a 23 mm Homograft valve/aortic root valve present in the aortic position. Procedure Date: 04/16/2020.  Echo findings are consistent with normal structure and function of the aortic valve prosthesis.  7. The inferior vena cava is normal in size with greater than 50% respiratory variability, suggesting right atrial pressure of 3 mmHg. Conclusion(s)/Recommendation(s): Normal biventricular function without evidence of hemodynamically significant valvular heart disease. FINDINGS  Left Ventricle: Left ventricular ejection fraction, by estimation, is 55 to 60%. The left ventricle has normal function. The left ventricle has no regional wall motion abnormalities. The left ventricular internal cavity size was normal in size. There is  no left ventricular hypertrophy. Right Ventricle: The right ventricular size is normal. No increase in right ventricular wall thickness. Right ventricular systolic function is mildly reduced. Left Atrium: Left atrial size was normal in size. No left atrial/left atrial appendage thrombus was detected. The LAA emptying velocity was 50 cm/s. Right Atrium: Right atrial size was normal in size. Pericardium: There is no evidence of pericardial effusion. Mitral Valve: Multiple regurgitant jets each with mild regurgitation. The mitral valve is normal in structure. Normal mobility of the mitral valve leaflets. Mild to moderate mitral valve regurgitation. No evidence of mitral valve stenosis. Tricuspid Valve: The tricuspid valve is normal in structure. Tricuspid valve regurgitation is mild to moderate.  No evidence of tricuspid stenosis. Aortic Valve: No perivalvular regurgitation or abscess. The aortic valve has been repaired/replaced. Aortic valve regurgitation is trivial. No aortic stenosis is present. There is a 23 mm Homograft valve/aortic root valve present in the aortic position. Procedure Date: 04/16/2020. Echo findings are consistent with normal structure and function of the aortic valve prosthesis. Pulmonic Valve: The pulmonic valve was normal in structure. Pulmonic valve regurgitation is trivial.  No evidence of pulmonic stenosis. Aorta: The aortic root is normal in size and structure. There is minimal (Grade I) plaque involving the descending aorta. Venous: The inferior vena cava is normal in size with greater than 50% respiratory variability, suggesting right atrial pressure of 3 mmHg. IAS/Shunts: No atrial level shunt detected by color flow Doppler. There is no evidence of a patent foramen ovale. There is no evidence of an atrial septal defect.  MR Peak grad:    75.3 mmHg   TRICUSPID VALVE MR Mean grad:    42.0 mmHg   TR Peak grad:   18.1 mmHg MR Vmax:         434.00 cm/s TR Vmax:        213.00 cm/s MR Vmean:        296.0 cm/s MR PISA:         1.01 cm MR PISA Eff ROA: 9 mm MR PISA Radius:  0.40 cm Skeet Latch MD Electronically signed by Skeet Latch MD Signature Date/Time: 04/29/2020/1:20:20 PM    Final    ECHO TEE  Result Date: 04/13/2020    TRANSESOPHOGEAL ECHO REPORT   Patient Name:   Johnathan Murray Sikora Date of Exam: 04/13/2020 Medical Rec #:  [id number]           Height:       70.0 in Accession #:    [id number]          Weight:       170.6 lb Date of Birth:  1951-08-20           BSA:          1.951 m Patient Age:    6 years            BP:           105/77 mmHg Patient Gender: M                   HR:           108 bpm. Exam Location:  Inpatient Procedure: Transesophageal Echo, 3D Echo, Color Doppler and Cardiac Doppler Indications:     I48.91* Unspecified atrial fibrillation  History:         Patient has prior history of Echocardiogram examinations, most                  recent 03/26/2020. CAD, Arrythmias:Atrial Fibrillation; Risk                  Factors:Diabetes, Dyslipidemia and Hypertension. 33mm Edwards                  Sapien Bioprosthetic Aortic Valve implanted 08/16/16.  Sonographer:     Raquel Sarna Senior RDCS Referring Phys:  [id number] Tami Lin [facility] Diagnosing Phys: Eleonore Chiquito MD PROCEDURE: After discussion of the risks and benefits of a TEE, an informed consent was obtained from the  patient. TEE procedure time was 19 minutes. The transesophogeal probe was passed without difficulty through the esophogus of the patient. Local oropharyngeal anesthetic was provided with viscous lidocaine.  Sedation performed by different physician. The patient was monitored while under deep sedation. Anesthestetic sedation was provided intravenously by Anesthesiology: 260mg  of Propofol, 50mg  of Lidocaine. Image quality was excellent. The patient's vital signs; including heart rate, blood pressure, and oxygen saturation; remained stable throughout the procedure. The patient developed no complications during the procedure. A successful direct current cardioversion was performed at 200 joules with 1 attempt. IMPRESSIONS  1. There is a small vegetation attached to the leaflet of the [place] that measures 0.8 cm x 0.5 cm. There is a pseudoaneurysm located in the anterior aspect of the aortic root, which is actually best seen on the supplemental TTE images. There is no fistulous connection with the LVOT and no apparent regurgitation. The color flow appears to be in and out of the pseudoaneurysm. 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 A cardiac CT is recommended to better characterize the location and extent of the pseudoaneurysm. The aortic valve has been repaired/replaced. Aortic valve regurgitation is not visualized. Aortic valve mean gradient measures 13.0 mmHg. Aortic valve Vmax measures 2.32 m/s.  2. Left ventricular ejection fraction, by estimation, is 50 to 55%. The left ventricle has low normal function. The left ventricle has no regional wall motion abnormalities.  3. Right ventricular systolic function is normal. The right ventricular size is normal.  4. No left atrial/left atrial appendage thrombus was detected. The LAA emptying velocity was 56 cm/s.  5. The mitral valve is grossly normal. Mild mitral valve regurgitation. No evidence of mitral stenosis.   6. There is a pseudoaneurysm of the aortic root anterior to the RCC with clear color flow into the affected area. Aortic root abscess present. There is mild (Grade II) layered plaque involving the descending aorta. FINDINGS  Left Ventricle: Left ventricular ejection fraction, by estimation, is 50 to 55%. The left ventricle has low normal function. The left ventricle has no regional wall motion abnormalities. The left ventricular internal cavity size was normal in size. There is no left ventricular hypertrophy. Abnormal (paradoxical) septal motion consistent with post-operative status. Right Ventricle: The right ventricular size is normal. No increase in right ventricular wall thickness. Right ventricular systolic function is normal. Left Atrium: Left atrial size was normal in size. No left atrial/left atrial appendage thrombus was detected. The LAA emptying velocity was 56 cm/s. Right Atrium: Right atrial size was normal in size. Pericardium: Trivial pericardial effusion is present. Mitral Valve: The mitral valve is grossly normal. There is mild thickening of the mitral valve leaflet(s). Mild mitral valve regurgitation. No evidence of mitral valve stenosis. There is no evidence of mitral valve vegetation. Tricuspid Valve: The tricuspid valve is grossly normal. Tricuspid valve regurgitation is trivial. No evidence of tricuspid stenosis. There is no evidence of tricuspid valve vegetation. Aortic Valve: There is a small vegetation attached to the leaflet of the [place] that measures 0.8 cm x 0.5 cm. There is a pseudoaneurysm located in the anterior aspect of the aortic root, which is actually best seen on the supplemental TTE images. There is no fistulous connection with the LVOT and no apparent regurgitation. The color flow appears to be in and out of the pseudoaneurysm. 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 A cardiac CT is recommended to better characterize  the location and extent of the pseudoaneurysm. The aortic valve has been repaired/replaced. Aortic valve regurgitation is not visualized. Aortic valve mean gradient measures 13.0 mmHg. Aortic valve peak gradient measures  21.5 mmHg. Aortic valve area, by VTI measures 1.79 cm. There is a 23 mm bioprosthetic valve present in the aortic position. Pulmonic Valve: The pulmonic valve was grossly normal. Pulmonic valve regurgitation is not visualized. No evidence of pulmonic stenosis. Aorta: There is a pseudoaneurysm of the aortic root anterior to the RCC with clear color flow into the affected area. Root abscess present. There is mild (Grade II) layered plaque involving the descending aorta. IAS/Shunts: No atrial level shunt detected by color flow Doppler.  LEFT VENTRICLE PLAX 2D LVOT diam:     2.10 cm LV SV:         62 LV SV Index:   32 LVOT Area:     3.46 cm  AORTIC VALVE AV Area (VTI):     1.79 cm AV Vmax:           232.08 cm/s AV Vmean:          172.165 cm/s AV VTI:            0.349 m AV Peak Grad:      21.5 mmHg AV Mean Grad:      13.0 mmHg LVOT VTI:          0.180 m LVOT/AV VTI ratio: 0.52  AORTA Ao Asc diam: 3.20 cm TRICUSPID VALVE TR Peak grad:   17.5 mmHg TR Vmax:        209.00 cm/s  SHUNTS Systemic VTI:  0.18 m Systemic Diam: 2.10 cm Eleonore Chiquito MD Electronically signed by Eleonore Chiquito MD Signature Date/Time: 04/13/2020/2:40:09 PM    Final    VAS US DOPPLER PRE CABG  Result Date: 04/15/2020 PREOPERATIVE VASCULAR EVALUATION  Indications:      Pre-CABG. Risk Factors:     Diabetes. Comparison Study: No prior studies. Performing Technologist: Carlos Levering Rvt  Examination Guidelines: A complete evaluation includes B-mode imaging, spectral Doppler, color Doppler, and power Doppler as needed of all accessible portions of each vessel. Bilateral testing is considered an integral part of a complete examination. Limited examinations for reoccurring indications may be performed as noted.  Right Carotid Findings:  +----------+--------+--------+--------+-----------------------+--------+           PSV cm/sEDV cm/sStenosisDescribe               Comments +----------+--------+--------+--------+-----------------------+--------+ CCA Prox  87      17              smooth and heterogenous         +----------+--------+--------+--------+-----------------------+--------+ CCA Distal72      20              smooth and heterogenous         +----------+--------+--------+--------+-----------------------+--------+ ICA Prox  60      14              calcific                        +----------+--------+--------+--------+-----------------------+--------+ ICA Distal75      28                                              +----------+--------+--------+--------+-----------------------+--------+ ECA       89      14                                              +----------+--------+--------+--------+-----------------------+--------+  Portions of this table do not appear on this page. +----------+--------+-------+--------+------------+           PSV cm/sEDV cmsDescribeArm Pressure +----------+--------+-------+--------+------------+ [id number]                                  +----------+--------+-------+--------+------------+ +---------+--------+--+--------+-+---------+ VertebralPSV cm/s29EDV cm/s7Antegrade +---------+--------+--+--------+-+---------+ Left Carotid Findings: +----------+--------+--------+--------+-----------------------+--------+           PSV cm/sEDV cm/sStenosisDescribe               Comments +----------+--------+--------+--------+-----------------------+--------+ CCA Prox  89      15              smooth and heterogenous         +----------+--------+--------+--------+-----------------------+--------+ CCA Distal80      27              calcific                        +----------+--------+--------+--------+-----------------------+--------+ ICA Prox  71       24              calcific                        +----------+--------+--------+--------+-----------------------+--------+ ICA Distal85      34                                              +----------+--------+--------+--------+-----------------------+--------+ ECA       78      9                                               +----------+--------+--------+--------+-----------------------+--------+ +----------+--------+--------+--------+------------+ SubclavianPSV cm/sEDV cm/sDescribeArm Pressure +----------+--------+--------+--------+------------+           94                                   +----------+--------+--------+--------+------------+ +---------+--------+--+--------+--+---------+ VertebralPSV cm/s52EDV cm/s23Antegrade +---------+--------+--+--------+--+---------+  ABI Findings: +--------+------------------+-----+---------+--------------+ Right   Rt Pressure (mmHg)IndexWaveform Comment        +--------+------------------+-----+---------+--------------+ Brachial                                Restricted arm +--------+------------------+-----+---------+--------------+ PTA     163               1.24 triphasic               +--------+------------------+-----+---------+--------------+ DP      147               1.12 triphasic               +--------+------------------+-----+---------+--------------+ +--------+------------------+-----+---------+-------+ Left    Lt Pressure (mmHg)IndexWaveform Comment +--------+------------------+-----+---------+-------+ [id number]                    triphasic        +--------+------------------+-----+---------+-------+ PTA     156               1.19 triphasic        +--------+------------------+-----+---------+-------+ DP  138               1.05 triphasic        +--------+------------------+-----+---------+-------+ +-------+---------------+----------------+ ABI/TBIToday's ABI/TBIPrevious  ABI/TBI +-------+---------------+----------------+ Right  1.24                            +-------+---------------+----------------+ Left   1.19                            +-------+---------------+----------------+  Right Doppler Findings: +--------+--------+-----+-------+--------------+ Site    PressureIndexDopplerComments       +--------+--------+-----+-------+--------------+ Brachial                    Restricted arm +--------+--------+-----+-------+--------------+  Left Doppler Findings: +--------+--------+-----+---------+--------+ Site    PressureIndexDoppler  Comments +--------+--------+-----+---------+--------+ [id number]          triphasic         +--------+--------+-----+---------+--------+ Radial               triphasic         +--------+--------+-----+---------+--------+ Ulnar                triphasic         +--------+--------+-----+---------+--------+  Summary: Right Carotid: Velocities in the right ICA are consistent with a 1-39% stenosis. Left Carotid: Velocities in the left ICA are consistent with a 1-39% stenosis. Vertebrals: Bilateral vertebral arteries demonstrate antegrade flow. Right ABI: Resting right ankle-brachial index is within normal range. No evidence of significant right lower extremity arterial disease. Left ABI: Resting left ankle-brachial index is within normal range. No evidence of significant left lower extremity arterial disease. Left Upper Extremity: Doppler waveform obliterate with left radial compression. Doppler waveforms decrease >50% with left ulnar compression.  Electronically signed by Curt Jews MD on 04/15/2020 at 7:14:30 PM.    Final    [place] EKG SITE RITE  Result Date: 04/21/2020 If Site Rite image not attached, placement could not be confirmed due to current cardiac rhythm.   Labs:  Basic Metabolic Panel: Recent Labs  Lab 05/05/20 0403 05/09/20 0829 05/10/20 0409 05/11/20 0415  NA 136 135 132* 135  K 4.1 4.2 4.0  4.1  CL 100 101 99 100  CO2 26 27 26 26   GLUCOSE 172* 190* 183* 130*  BUN 20 15 16 18   CREATININE 1.23 1.25* 1.31* 1.29*  CALCIUM 8.3* 8.4* 8.0* 8.3*  MG  --  1.9 1.9 2.0    CBC: Recent Labs  Lab 05/09/20 1124 05/09/20 1124 05/10/20 0409 05/10/20 2155 05/11/20 0415  WBC 9.2  --  8.5  --  10.9*  NEUTROABS  --   --  6.3  --  8.5*  HGB 7.1*   < > 6.2* 8.5* 8.4*  HCT 23.4*   < > 20.4* 27.0* 26.7*  MCV 92.9  --  93.2  --  89.9  PLT 345  --  324  --  359   < > = values in this interval not displayed.    CBG: Recent Labs  Lab 05/09/20 2139 05/10/20 0754 05/10/20 1202 05/10/20 1640 05/10/20 2128  GLUCAP 142* 146* 158* 148* 111*    Brief HPI:   Enos Muhl is a 69 y.o. right-handed male with history of diabetes mellitus, PAF maintained on amiodarone as well as Eliquis, hypertension, bioprosthetic aortic valve 2017 lumbar decompression 06/11/2019 and recent C3-4 anterior cervical discectomy.  Patient lives with spouse.  Used a rolling  walker prior to admission.  Presented 03/19/2020 with low-grade fever and weakness of lower extremities.  BUN 98 creatinine 6.40 his LFTs were markedly elevated.  CT abdomen pelvis as well as CT cervical spine unremarkable.  MRI cervical spine negative for infection.  He did receive a fluid bolus for some hypotension.  Placed on broad-spectrum antibiotics.  MRI lumbar spine findings consistent with acute osteomyelitis discitis L4-5.  Evidence for associated soft tissue infection cellulitis within the psoas musculature bilaterally.  Additional multilevel degenerative spondylosis.  Neurosurgery consulted no current plan for surgical intervention he did undergo aspiration of disc space L4-5.  TEE showed no obvious vegetation.  Orthopedic services consulted Dr. Mardelle Matte for bilateral knee pain noted swelling of left knee x-rays degenerative change as well as large joint effusion.  Doppler showed ruptured Baker's cyst on left lower extremity.  Patient  underwent lower extremity joint aspiration right lower extremity joint aspiration 03/22/2020 both showing intracellular calcium pyrophosphate crystals culture grew E Faecalis.  Patient did receive steroid injection of both knees.  Underwent I&D bilateral knees 03/24/2020.  Infectious disease consulted placed on IV antibiotics for 7 to 8 weeks through 05/20/2020.  AKI on CKD stage III continue to improve multifactorial from bacteremia latest creatinine 1.39.  Normocytic anemia postoperative patient transfused.  He is admitted to inpatient rehab services 04/07/2020 for ongoing comprehensive rehab therapies.  Slow progress noted.  Bouts of tachycardia followed by cardiology services his amiodarone was resumed now that his renal function had improved.  His Eliquis was ongoing as prior to admission.  Patient bouts of low-grade fever followed by infectious disease.  Ceftriaxone was added to patient's ampicillin for increased coverage.  A follow-up TEE was completed findings of thoracic root abscess patient transferred to acute care services for ongoing care 04/13/2020.  He underwent redo sternotomy ascending aortic root replacement 04/16/2020 per Dr. Cyndia Bent.  His Eliquis again was resumed.  Bouts of A. fib RVR underwent TEE/DCCV 04/29/2020 per cardiology services.  No thrombus or mass noted initially placed on IV amiodarone transition to p.o.  Antibiotic therapy ongoing.  Patient was again readmitted inpatient rehab services for comprehensive therapies.  Orthopedic service did follow-up for septic knee recommendations were made for open synovectomy and washout for 05/02/2020.  Initial plans are patient to return to inpatient rehab services however with ongoing issues of infection septic knee he was transferred to acute care services for ongoing care 05/02/2020.   Hospital Course: Johnathan Murray was admitted to rehab 05/02/2020 for inpatient therapies to consist of PT, ST and OT at least three hours five days a week. Past  admission physiatrist, therapy team and rehab RN have worked together to provide customized collaborative inpatient rehab.  Patient was admitted to inpatient rehab services 05/02/2020 to resume inpatient rehab services however orthopedic services have followed up in regards to septic knee plan for open synovectomy and patient was transferred to acute care services 05/02/2020.   Blood pressures were monitored on TID basis and controlled     Rehab course: During patient's stay in rehab weekly team conferences were held to monitor patient's progress, set goals and discuss barriers to discharge. At admission, patient required minimal assist side-lying to sitting moderate assist sit to supine.  +2 physical assist for functional mobility during ADLs.  Physical exam.  Blood pressure 107/55 pulse 65 temperature 98.3 respirations 17 oxygen saturation 100% room air Constitutional alert and oriented x3 Head.  Normocephalic and atraumatic Eyes.  Pupils round and reactive to light no discharge without  nystagmus Neck.  Supple nontender no JVD without thyromegaly Cardiac regular rate rhythm without any extra sounds or murmur heard Abdomen.  Soft nontender positive bowel sounds without rebound Respiratory effort normal no respiratory distress without wheeze Musculoskeletal.  Right upper extremity biceps 5/5 triceps 4+/5 wrist extension and grip 5/5 in finger abduction 5 -/5 Left upper extremity biceps 5/5 triceps 4+/5 wrist extension and grip 5/5 finger abduction 5 -/5 Right lower extremity hip flexion 3/5 knee extension knee flexion 4 -/5 dorsiflexion plantarflexion 4+/5 Left lower extremity hip flexion 3 -/5 knee extension knee flexion 3+/5 dorsiflexion plantarflexion 5 -/5   He/  has had improvement in activity tolerance, balance, postural control as well as ability to compensate for deficits. He/ has had improvement in functional use RUE/LUE  and RLE/LLE as well as improvement in awareness.  Therapy  evaluations were to be completed however plan is for open synovectomy in place and was transferred to acute care services       Disposition: Transfer to acute care services    Diet: Regular consistency  Special Instructions:  30-35 minutes were spent completing discharge summary     Follow-up Information    Revankar, Reita Cliche, MD Follow up on 05/27/2020.   Specialty: Cardiology Why: Please arrive 15 minutes early for your 4pm post-hospital cardiology follow-up appointment.  Contact information: [address] [place] [address] [phone]               Signed: Lavon Paganini [place] 05/11/2020, 7:38 AM

## 2020-05-11 NOTE — Progress Notes (Signed)
Inpatient Rehab Admissions Coordinator:   Awaiting insurance authorization.  Faxed updated clinicals per their request.    Shann Medal, PT, DPT Admissions Coordinator [phone] 05/11/20  3:36 PM

## 2020-05-11 NOTE — Discharge Summary (Deleted)
  The note originally documented on this encounter has been moved the the encounter in which it belongs.  

## 2020-05-12 LAB — CBC WITH DIFFERENTIAL/PLATELET
Abs Immature Granulocytes: 0.07 10*3/uL (ref 0.00–0.07)
Basophils Absolute: 0.1 10*3/uL (ref 0.0–0.1)
Basophils Relative: 1 %
Eosinophils Absolute: 0.4 10*3/uL (ref 0.0–0.5)
Eosinophils Relative: 5 %
HCT: 25.5 % — ABNORMAL LOW (ref 39.0–52.0)
Hemoglobin: 8 g/dL — ABNORMAL LOW (ref 13.0–17.0)
Immature Granulocytes: 1 %
Lymphocytes Relative: 12 %
Lymphs Abs: 1 10*3/uL (ref 0.7–4.0)
MCH: 28.5 pg (ref 26.0–34.0)
MCHC: 31.4 g/dL (ref 30.0–36.0)
MCV: 90.7 fL (ref 80.0–100.0)
Monocytes Absolute: 0.6 10*3/uL (ref 0.1–1.0)
Monocytes Relative: 7 %
Neutro Abs: 6.3 10*3/uL (ref 1.7–7.7)
Neutrophils Relative %: 74 %
Platelets: 373 10*3/uL (ref 150–400)
RBC: 2.81 MIL/uL — ABNORMAL LOW (ref 4.22–5.81)
RDW: 17.8 % — ABNORMAL HIGH (ref 11.5–15.5)
WBC: 8.4 10*3/uL (ref 4.0–10.5)
nRBC: 0 % (ref 0.0–0.2)

## 2020-05-12 LAB — MAGNESIUM: Magnesium: 2.1 mg/dL (ref 1.7–2.4)

## 2020-05-12 LAB — GLUCOSE, CAPILLARY
Glucose-Capillary: 81 mg/dL (ref 70–99)
Glucose-Capillary: 219 mg/dL — ABNORMAL HIGH (ref 70–99)
Glucose-Capillary: 129 mg/dL — ABNORMAL HIGH (ref 70–99)
Glucose-Capillary: 195 mg/dL — ABNORMAL HIGH (ref 70–99)

## 2020-05-12 LAB — COMPREHENSIVE METABOLIC PANEL
ALT: 41 U/L (ref 0–44)
AST: 32 U/L (ref 15–41)
Albumin: 1.8 g/dL — ABNORMAL LOW (ref 3.5–5.0)
Alkaline Phosphatase: 90 U/L (ref 38–126)
Anion gap: 9 (ref 5–15)
BUN: 18 mg/dL (ref 8–23)
CO2: 27 mmol/L (ref 22–32)
Calcium: 8.4 mg/dL — ABNORMAL LOW (ref 8.9–10.3)
Chloride: 101 mmol/L (ref 98–111)
Creatinine, Ser: 1.24 mg/dL (ref 0.61–1.24)
GFR calc Af Amer: >60 mL/min (ref >60)
GFR calc non Af Amer: 60 mL/min — ABNORMAL LOW (ref >60)
Glucose, Bld: 92 mg/dL (ref 70–99)
Potassium: 3.8 mmol/L (ref 3.5–5.1)
Sodium: 137 mmol/L (ref 135–145)
Total Bilirubin: 1.2 mg/dL (ref 0.3–1.2)
Total Protein: 5.1 g/dL — ABNORMAL LOW (ref 6.5–8.1)

## 2020-05-12 LAB — BRAIN NATRIURETIC PEPTIDE: B Natriuretic Peptide: 133.4 pg/mL — ABNORMAL HIGH (ref 0.0–100.0)

## 2020-05-12 MED ORDER — FUROSEMIDE 10 MG/ML IJ SOLN
40.0000 mg | Freq: Once | INTRAMUSCULAR | Status: AC
  Administered 2020-05-12: 40 mg via INTRAVENOUS
  Filled 2020-05-12: qty 4

## 2020-05-12 MED ORDER — POTASSIUM CHLORIDE CRYS ER 20 MEQ PO TBCR
40.0000 meq | EXTENDED_RELEASE_TABLET | Freq: Once | ORAL | Status: AC
  Administered 2020-05-12: 40 meq via ORAL
  Filled 2020-05-12: qty 2

## 2020-05-12 NOTE — Progress Notes (Signed)
PROGRESS NOTE        PATIENT DETAILS Name: Johnathan Murray Age: 69 y.o. Sex: male Date of Birth: [birth year] Admit Date: 05/02/2020 Admitting Physician Norval Morton, MD [id number], [name], MD  Brief Narrative: Patient is a 69 y.o. 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  Patient was seen by cardiothoracic surgery-underwent debridement of aortic root abscess/aortic valve replacement with bioprosthetic valve-ID was consulted-patient was subsequently discharged to CIR on 6/10.  While at CIR-patient started having gradually worsening anemia, bilateral knee pain/swelling-arthrocentesis was suggestive of septic knee-patient subsequently underwent irrigation/debridement and total synovectomy of bilateral knees-postoperatively-patient was noted to have A. fib with RVR and severe anemia due to blood loss from left knee bleeding-and readmitted to the hospitalist service.  See below for further details.  Significant events: 4/29-5/18>> admit by [facility] for sepsis secondary to Enterococcus faecalis, found to have acute osteo/discitis of L/-/5, left knee septic arthritis-discharge to CIR  5/18-5/24>> at CIR-aortic root abscess/prosthetic aortic valve vegetation on TEE (done for cardioversion) 5/24-6/10>> admit by [facility] from CIR-for aortic root replacement-discharged back to CIR. 6/10-6/12>> at CIR- recurrent bilateral knee septic arthritis on arthrocentesis-ortho reconsulted 6/12-current>> admit to [facility] for A. fib with RVR, severe  anemia following bilateral knee washout for septic arthritis.  Significant postoperative bleeding from left knee.  Significant studies: 4/29>> CT abdomen/pelvis: No acute intra-abdominal or pelvic pathology 4/29>> CT C-spine: Interval removal of fixating plate/screws from C4-C6, no acute osseous abnormality 4/30>>left lower extremity Doppler: No DVT, ruptured Baker's cyst 5/3>> MRI C-spine: No evidence of infection, s/p C3-4 discectomy/fusion. 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  Procedures : 6/12>> irrigation/debridement with total synovectomy of bilateral knees 6/9>> TEE guided cardioversion 5/27>>S/p Redo median sternotomy, rightaxillaryartery graftfor cardiopulmonary bypass, extracorporeal circulation, debridement of aortic root abscess and pseudoaneurysm, and aortic root replacement using a 1mm Homograft aortic root with reimplantation of coronary arteries. 5/24>> TEE guided cardioversion.  Found to have vegetation on prosthetic aortic valve on TEE. 5/18>> fluoroscopy guided needle aspiration of L4-L5 disc space 5/4>> B/L knee irrigation irrigation debridement arthroscopic. B/L knee medial and lateral menisectomies.  Antimicrobial therapy: Ampicillin: 5/18>> Rocephin: 5/19>>  Microbiology data: 6/12>> knee culture: No growth 6/10>> left knee synovial fluid culture: No  growth  5/27>> aortic valve tissue culture: No growth 5/26>> blood culture: No growth 5/22>> urine culture: No growth 5/19>> left synovitic fluid culture: No growth 5/18>> L4-L5 disc aspirate: Enterococcus faecalis 5/6>> blood culture: No growth 5/3>> blood culture: No growth 5/2>> right knee synovial fluid: No growth 5/1>> left knee synovial fluid: Enterococcus  faecalis 4/30>> blood culture: Enterococcus faecalis 4/29>> urine culture: No growth  Consults: ID [facility] Cardiology Ortho  DVT Prophylaxis : Eliquis  Subjective:  Patient in bed, appears comfortable, denies any headache, no fever, no chest pain or pressure, no shortness of breath , no abdominal pain. No focal weakness.  Assessment/Plan:  Hemorrhagic shock 2/2 to post op bleeding from left knee with acute blood loss anemia superimposed on anemia of chronic disease: Shock physiology has resolved-transfused a total of 5 units of PRBC this admission.  Left knee bleeding seems to have essentially resolved. Coagulation was cautiously started on 05/06/2020 however on 05/10/2020 he likely has bled again and heparin drip has been stopped.  Will transfuse another 2 units of packed RBCs on 05/10/2020.  Case has been discussed with Dr. Sharol Given orthopedics he is following the patient closely.  We will think for now seems to have resolved on 05/11/2021 will monitor closely.  For now continue to hold anticoagulation.   A. fib with RVR with [place] vas 2 score of greater than 3: Rate controlled but remains in atrial fibrillation-continue amiodarone and low-dose beta-blocker.  Cardiology following, anticoagulation as above.  Patient and family understand the risks and benefits of holding anticoagulation or using it.  Enterococcus bacteremia-with L4-L5 discitis, bilateral septic knee, prosthetic valve vegetation with aortic root abscess: Evaluated by infectious disease-recommendations are to continue ceftriaxone/ampicillin till 05/20/2020-and then to  start p.o. amoxicillin 500 mg twice daily from 05/21/2020.  Appointment made with Dr. Megan Salon on 06/03/2020 at 2:45 PM.    CAD-s/p CABG-no anginal symptoms-continue supportive care  Hypothyroidism: Continue levothyroxine  DM-2 with uncontrolled hyperglycemia: CBGs relatively stable-continue Lantus 10 units twice daily, SSI.  Follow and adjust.     Recent Labs    05/11/20 1623 05/11/20 2131 05/12/20 0735  GLUCAP 156* 96 81   Debility/deconditioning: Secondary to acute illness-we will need PT/OT-plans are to discharge back to CIR once insurance authorization/bed available..   Diet: Diet Order            Diet Carb Modified Fluid consistency: Thin; Room service appropriate? Yes  Diet effective now                Code Status: Full code   Family Communication: Spouse at bedside on 05/09/2020  Disposition Plan: Status is: Inpatient  Remains inpatient appropriate because:Inpatient level of care appropriate due to severity of illness  Dispo: The patient is from: Home              Anticipated d/c is to: CIR              Anticipated d/c date is: > 1 days              Patient currently is  medically stable to d/c.   Barriers to Discharge: Awaiting CIR bed/insurance authorization  Antimicrobial agents: Anti-infectives (From admission, onward)   Start     Dose/Rate Route Frequency Ordered Stop   05/21/20 1000  amoxicillin (AMOXIL) capsule 500 mg     Discontinue     500 mg Oral Every 12 hours 05/06/20 1103     05/21/20 0000  amoxicillin (AMOXIL) 500 MG capsule     Discontinue    Note to Pharmacy: Put on file. Patient going to CIR prior to discharge   500 mg Oral 2 times daily 05/06/20 1109 09/18/20 2359   05/03/20 0500  cefTRIAXone (ROCEPHIN) 2 g in sodium chloride 0.9 % 100 mL IVPB     Discontinue  2 g 200 mL/hr over 30 Minutes Intravenous Every 12 hours 05/02/20 2155 05/20/20 2359   05/02/20 2245  cefTRIAXone (ROCEPHIN) 2 g in sodium chloride 0.9 % 100 mL IVPB  Status:   Discontinued        2 g 200 mL/hr over 30 Minutes Intravenous Every 12 hours 05/02/20 1943 05/02/20 2155   05/02/20 1800  ampicillin (OMNIPEN) 2 g in sodium chloride 0.9 % 100 mL IVPB     Discontinue     2 g 300 mL/hr over 20 Minutes Intravenous Every 4 hours 05/02/20 1745 05/20/20 2359       Time spent: 25 minutes-Greater than 50% of this time was spent in counseling, explanation of diagnosis, planning of further management, and coordination of care.  MEDICATIONS: Scheduled Meds: . amiodarone  200 mg Oral BID   Followed by  . [START ON 05/24/2020] amiodarone  200 mg Oral Daily  . [START ON 05/21/2020] amoxicillin  500 mg Oral Q12H  . Chlorhexidine Gluconate Cloth  6 each Topical Daily  . feeding supplement (ENSURE ENLIVE)  237 mL Oral TID BM  . ferrous [id number] C-folic acid  1 capsule Oral Q breakfast  . insulin aspart  0-20 Units Subcutaneous TID WC  . insulin aspart  0-5 Units Subcutaneous QHS  . insulin glargine  10 Units Subcutaneous BID  . levothyroxine  25 mcg Oral QAC breakfast  . metoprolol tartrate  12.5 mg Oral BID  . oxyCODONE  10 mg Oral Q12H  . polyethylene glycol  17 g Oral Daily  . pregabalin  100 mg Oral TID  . senna  1 tablet Oral QHS  . sodium chloride flush  10-40 mL Intracatheter Q12H   Continuous Infusions: . ampicillin (OMNIPEN) IV 2 g (05/12/20 0812)  . cefTRIAXone (ROCEPHIN) IVPB 2 gram/100 mL NS (Mini-Bag Plus) 2 g (05/12/20 0553)   PRN Meds:.acetaminophen **OR** acetaminophen, albuterol, cyclobenzaprine, morphine injection, oxyCODONE   PHYSICAL EXAM: Vital signs: Vitals:   05/12/20 0008 05/12/20 0402 05/12/20 0555 05/12/20 0752  BP: 108/74 113/66  104/78  Pulse: (!) 140 100    Resp: 13 14  13   Temp: 98.4 F (36.9 C) 98.3 F (36.8 C)  98.3 F (36.8 C)  TempSrc: Oral Oral  Oral  SpO2: 100% 92%  100%  Weight:   90.5 kg   Height:       Filed Weights   05/09/20 0900 05/11/20 0444 05/12/20 0555  Weight: 91.6 kg 91.5 kg 90.5 kg    Body mass index is 28.63 kg/m.   Gen Exam:  Awake Alert, No new F.N deficits, Normal affect [place].AT,PERRAL Supple Neck,No JVD, No cervical lymphadenopathy appriciated.  Symmetrical Chest wall movement, Good air movement bilaterally, CTAB RRR,No Gallops, positive systolic murmur, No Parasternal Heave +ve B.Sounds, Abd Soft, No tenderness, No organomegaly appriciated, No rebound - guarding or rigidity. bilateral moderate knee swelling, minimal bleeding around the left knee incision site, both incision sites appear stable no signs of hematoma on exam i.e. no discoloration   I have personally reviewed following labs and imaging studies  LABORATORY DATA: CBC: Recent Labs  Lab 05/09/20 0829 05/09/20 0829 05/09/20 1124 05/10/20 0409 05/10/20 2155 05/11/20 0415 05/12/20 0340  WBC 8.9  --  9.2 8.5  --  10.9* 8.4  NEUTROABS  --   --   --  6.3  --  8.5* 6.3  HGB 7.0*   < > 7.1* 6.2* 8.5* 8.4* 8.0*  HCT 22.8*   < > 23.4* 20.4* 27.0* 26.7* 25.5*  MCV 91.6  --  92.9 93.2  --  89.9 90.7  PLT 348  --  345 324  --  359 373   < > = values in this interval not displayed.    Basic Metabolic Panel: Recent Labs  Lab 05/09/20 0829 05/10/20 0409 05/11/20 0415 05/12/20 0340  NA 135 132* 135 137  K 4.2 4.0 4.1 3.8  CL 101 99 100 101  CO2 27 26 26 27   GLUCOSE 190* 183* 130* 92  BUN 15 16 18 18   CREATININE 1.25* 1.31* 1.29* 1.24  CALCIUM 8.4* 8.0* 8.3* 8.4*  MG 1.9 1.9 2.0 2.1    GFR: Estimated Creatinine Clearance: 65.4 mL/min (by C-G formula based on SCr of 1.24 mg/dL).  Liver Function Tests: Recent Labs  Lab 05/10/20 0409 05/11/20 0415 05/12/20 0340  AST 22 26 32  ALT 33 37 41  ALKPHOS 74 89 90  BILITOT 0.5 1.0 1.2  PROT 4.7* 5.1* 5.1*  ALBUMIN 1.7* 1.9* 1.8*   No results for input(s): LIPASE, AMYLASE in the last 168 hours. No results for input(s): AMMONIA in the last 168 hours.  Coagulation Profile: No results for input(s): INR, PROTIME in the last 168  hours.  Cardiac Enzymes: No results for input(s): CKTOTAL, CKMB, CKMBINDEX, TROPONINI in the last 168 hours.  BNP (last 3 results) No results for input(s): PROBNP in the last 8760 hours.  Lipid Profile: No results for input(s): CHOL, HDL, LDLCALC, TRIG, CHOLHDL, LDLDIRECT in the last 72 hours.  Thyroid Function Tests: No results for input(s): TSH, T4TOTAL, FREET4, T3FREE, THYROIDAB in the last 72 hours.  Anemia Panel: No results for input(s): VITAMINB12, FOLATE, FERRITIN, TIBC, IRON, RETICCTPCT in the last 72 hours.  Urine analysis:    Component Value Date/Time   COLORURINE YELLOW 04/11/2020 2245   APPEARANCEUR CLEAR 04/11/2020 2245   LABSPEC 1.009 04/11/2020 2245   PHURINE 7.0 04/11/2020 2245   GLUCOSEU NEGATIVE 04/11/2020 2245   HGBUR NEGATIVE 04/11/2020 2245   BILIRUBINUR NEGATIVE 04/11/2020 2245   KETONESUR NEGATIVE 04/11/2020 2245   PROTEINUR NEGATIVE 04/11/2020 2245   NITRITE NEGATIVE 04/11/2020 2245   LEUKOCYTESUR NEGATIVE 04/11/2020 2245    Sepsis Labs: Lactic Acid, Venous    Component Value Date/Time   LATICACIDVEN 1.0 03/20/2020 1517    MICROBIOLOGY: No results found for this or any previous visit (from the past 240 hour(s)).  RADIOLOGY STUDIES/RESULTS: No results found.   LOS: 10 days   Lala Lund, MD  [facility]    To contact the attending provider between 7A-7P or the covering provider during after hours 7P-7A, please log into the web site [email or web address] and access using universal [place] password for that web site. If you do not have the password, please call the hospital operator.  05/12/2020, 11:31 AM

## 2020-05-12 NOTE — Progress Notes (Signed)
Physical Therapy Treatment Patient Details Name: Johnathan Murray MRN: [id number] DOB: 08/18/1951 Today's Date: 05/12/2020    History of Present Illness Patient is a 69 y.o. 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  Patient was seen by cardiothoracic surgery-underwent debridement of aortic root abscess/aortic valve replacement with bioprosthetic valve 04/17/20 via median sternotomy-ID was consulted-patient was subsequently discharged to [place] on 6/10.  While at CIR-patient started having gradually worsening anemia, bilateral knee pain/swelling-arthrocentesis was suggestive of septic knee-patient subsequently underwent irrigation/debridement and total synovectomy of bilateral knees-post procedure-patient was noted to have A. fib with RVR and severe anemia-and readmitted to the hospitalist service.  post op bleeding from left knee with acute blood loss anemia superimposed on anemia of chronic disease    PT Comments    Patient able to stand with Sara-plus (motorized standing lift) for at least 3 minutes while completing pre-gait activities and eventually walking 3 ft. Distance limited due to following pt with elevated bed for him to sit on when done walking (as he cannot tolerate enough knee flexion to sit in standard (or built up) chair). Bed was lowered to assure his buttocks did not slip off edge of bed and pt with significant bil knee pain due to knee flexion. Patient quickly moved to allow him to straighten his knees. Returned pt to supine. Bil knee  bandages had slid down pt's legs in standing and noted that left bandage had very small amount of serosanguineous drainage. No new drainage observed. Applied ice packs to quads (just above patella) for pain relief and to decr edema.    Follow Up Recommendations  CIR     Equipment Recommendations  None recommended by PT    Recommendations for Other Services OT consult     Precautions / Restrictions Precautions Precautions: Sternal;Fall;Other (comment) Precaution Comments: bleeding from left knee; esp if anticoagulated Required Braces or Orthoses: Spinal Brace Spinal Brace: Lumbar corset Other Brace: for comfort, pt reports "it doesn't really help me" so we didn't use back brace Restrictions Other Position/Activity Restrictions: sternal precautions    Mobility  Bed Mobility Overal bed mobility: Needs Assistance Bed Mobility: Rolling;Sidelying to Sit;Sit to Sidelying Rolling: Mod assist (rail) Sidelying to sit: Mod assist (HOB elevated)     Sit to sidelying: Mod assist;+2 for safety/equipment General bed mobility comments: requires assist at his legs due to painful knees and at torso  Transfers Overall transfer level: Needs assistance   Transfers: Sit to/from Stand Sit to Stand: +2 safety/equipment (stood with motorized lift)         General transfer comment: able to tolerate weight on bil LEs and even weight-shfit and then march prior to ambulation  Ambulation/Gait Ambulation/Gait assistance: Mod assist;+2 safety/equipment (assist with Sara-Plus) Gait Distance (Feet): 3 Feet Assistive device:  (Sara-Plus) Gait Pattern/deviations: Step-to pattern;Decreased stride length Gait velocity: decreased   General Gait Details: initial pre-gait as above; 2 person assist to guide Sara-Plus and manage IV/monitor; distance limited by need to follow with bed to have elevated surface for pt to return to   [employer]  Modified Rankin (Stroke  Patients Only)       Balance Overall balance assessment: Needs assistance Sitting-balance support: Feet supported;Bilateral upper extremity supported Sitting balance-Leahy Scale: Fair     Standing balance support: Bilateral upper extremity supported;Single extremity supported;During functional activity Standing balance-Leahy Scale: Poor Standing balance comment: reliant on external assist or AD                            Cognition Arousal/Alertness: Awake/alert Behavior During Therapy: WFL for tasks assessed/performed;Anxious Overall Cognitive Status: Within Functional Limits for tasks assessed                                 General Comments: cognition not specifically tested      Exercises      General Comments General comments (skin integrity, edema, etc.): Wife and pt performed warm-up/ROM exercises for bil LEs prior to PT arrival      Pertinent Vitals/Pain Pain Assessment: 0-10 Pain Score: 9  Faces Pain Scale:  (bil knees with weight-bearing) Pain Location: bil knee> back Pain Descriptors / Indicators: Grimacing;Guarding;Moaning Pain Intervention(s): Monitored during session;Premedicated before session;Repositioned;Ice applied    Home Living                      Prior Function            PT Goals (current goals can now be found in the care plan section) Acute Rehab PT Goals Patient Stated Goal: to get stronger and go back to work Time For Goal Achievement: 05/17/20 Potential to Achieve Goals: Good Progress towards PT goals: Progressing toward goals    Frequency    Min 4X/week      PT Plan Current plan remains appropriate    Co-evaluation              AM-PAC PT "6 Clicks" Mobility   Outcome Measure  Help needed turning from your back to your side while in a flat bed without using bedrails?: A Lot Help needed moving from lying on your back to sitting on the side of a flat bed without using bedrails?: A  Lot Help needed moving to and from a bed to a chair (including a wheelchair)?: Total Help needed standing up from a chair using your arms (e.g., wheelchair or bedside chair)?: Total Help needed to walk in hospital room?: A Lot Help needed climbing 3-5 steps with a railing? : Total 6 Click Score: 9    End of Session Equipment Utilized During Treatment: Other (comment) (Sara-plus) Activity Tolerance: Patient limited by pain Patient left: in bed;with call bell/phone within reach;with family/visitor present Nurse Communication: Mobility status;Other (comment);Need for lift equipment;Precautions (used Sara-Plus; incr knee pain with attempt to sit) PT Visit Diagnosis: Other abnormalities of gait and mobility (R26.89);Muscle weakness (generalized) (M62.81);Pain Pain - Right/Left:  (both) Pain - part of body: Knee (and back)     Time: 1451-1535 PT Time Calculation (min) (ACUTE ONLY): 44 min  Charges:  $Gait Training: 8-22 mins $Therapeutic Activity: 23-37 mins                      Arby Barrette, PT Pager [phone]    Rexanne Mano 05/12/2020, 4:23 PM

## 2020-05-12 NOTE — PMR Pre-admission (Signed)
PMR Admission Coordinator Pre-Admission Assessment  Patient: Johnathan Murray is an 69 y.o., male MRN: [id number] DOB: Oct 05, 1951 Height: '5\' 10"'$  (177.8 cm) Weight: 90.6 kg  Insurance Information HMO:     PPO: yes     PCP:      IPA:      80/20:      OTHER:  PRIMARY: [employer] [place]      Policy#: [id number]      Subscriber: pt CM Name: Seth Bake      Phone#: [phone]     Fax#: [phone] Pre-Cert#: [id number] auth provided via fax, with updates due on 6/28 to fax listed above      Employer:  Benefits:  Phone #: [phone]     Name:  Eff. Date: 11/21/2013     Deduct: $600 (met)      Out of Pocket Max: $1800 (met)      Life Max: n/a CIR: 80%      SNF: 80% Outpatient: 80%     Co-Pay: 20% Home Health: 100%      Co-Pay:  DME: 80%     Co-Pay: 20% Providers:  SECONDARY: Medicare A      Policy#: [id number]     Phone#:   Development worker, community:       Phone#:   The Engineer, petroleum" for patients in Inpatient Rehabilitation Facilities with attached "Privacy Act [place] Records" was provided and verbally reviewed with: Patient and Family  Emergency Contact Information Contact Information    Name Relation Home Work Mobile   Pursel,[name] Spouse [phone]  [phone]      Current Medical History  Patient Admitting Diagnosis: debility following multiple medical issues/procedures (discitis, endocarditis s/p aortic root repair, bilateral septic arthritis in knees s/p bilat synovectomies)  History of Present Illness: Johnathan Murray is a 69 year old right-handed male history of diabetes mellitus, PAF maintained on amiodarone as well as Eliquis, hypertension, bioprosthetic aortic valve replacement 2017, lumbar laminectomy decompression 06/11/2019 as well as recent C3-4 anterior cervical discectomy with interbody fusion for cervical stenosis myelopathy 03/02/2020 per [name]. Patient initially presented 03/19/2020 with low-grade fever and weakness of lower  extremities.  Lactic acid 1.6, sodium 130, BUN 98, creatinine 6.40 with latest prior creatinine 1.63 on 02/24/2020, WBC 12,600 hemoglobin 10.4 CK of 20, SARS coronavirus negative.  His LFTs were markedly elevated.  CT of abdomen pelvis well a CT cervical spine unremarkable.  MRI cervical spine negative for infection.  Patient was hypotensive received IV fluid bolus.  Placed on broad-spectrum antibiotics for suspected sepsis.  MRI lumbar spine findings consistent with acute osteomyelitis discitis L4-5.  Evidence for associated soft tissue infection cellulitis within the psoas musculature bilaterally with a few small superimposed abscesses.  Additionally multilevel degenerative spondylosis most pronounced at L3-4.  Neurosurgery consulted no plan for surgical intervention he did undergo aspiration of disc space L4-5.  TEE showed no obvious vegetation and follow-up cardiology services.  Orthopedic service was consulted Dr. Mardelle Matte for bilateral knee pain with noted swelling of left knee.  X-ray showed diffuse degenerative changes as well as large joint effusion.  Doppler showed ruptured Baker's cyst on left lower extremity.  Patient underwent left lower extremity joint aspiration 03/21/2020 and right lower extremity joint aspiration 03/22/2020 both showing intracellular calcium pyrophosphate crystals WBC was up to 15,000 culture grew rare E Faecalis.  Patient did receive steroid injection both knees.  Underwent I&D bilateral knees 03/24/2020.  Infectious disease consulted continue to follow maintained on intravenous ampicillin x7 to 8 weeks  through 05/20/2020 PICC line was placed 03/29/2020.  AKI on CKD stage III continue to improve multifactorial from bacteremia with latest creatinine 1.39.  Normocytic anemia postoperative patient transfused 2 units packed red blood cells.  He was admitted to inpatient rehab services 04/07/2020 for ongoing comprehensive inpatient rehab services with slow progressive gains.  Bouts of tachycardia  followed by cardiology services his amiodarone had since been resumed now that his renal function had improved.  His Eliquis was ongoing as prior to admission.  Patient with persistent bouts of atrial fibrillation with RVR underwent TEE/DCCV 04/29/2020 per cardiology services that showed, bioprosthetic aortic valve well-seated without an perivalvular regurgitation.  No LA/LAA thrombus or masses and was initially placed on IV amiodarone transition to p.o. amiodarone as well as low-dose beta-blocker.  Ceftriaxone ampicillin ongoing again followed by infectious disease.  Therapy evaluations completed and patient was admitted to inpatient rehab services 04/30/2020 for comprehensive rehab therapy.  On 05/01/2020 reports increasing left knee pain follow-up orthopedic services aspiration completed of knee demonstrated more than 100,000 white blood cells positive crystals.  Fluid was not able to be obtained from right knee and due to these findings suspect bilateral septic knees patient was transferred again back to acute care services 05/02/2020 undergoing total synovectomy irrigation debridement of bilateral knees application of wound VAC 05/02/2020 per Dr. Sharol Given.  Patient with scheduled pain management OxyContin 10 mg every 12 hours as well as Lyrica 100 mg 3 times daily.  He again presently continues on ampicillin as well as Rocephin through 05/20/2020.  His Eliquis currently remains on hold cardiac rate controlled amiodarone as directed.  Patient is again recommended for inpatient rehab services to continue aggressive inpatient rehab services.    Patient's medical record from [facility] has been reviewed by the rehabilitation admission coordinator and physician.  Past Medical History  Past Medical History:  Diagnosis Date  . Abnormal LFTs (liver function tests) 10/31/2016  . Abnormal nuclear stress test 05/03/2018  . Acid reflux 02/15/2018   not at present  . Acute otitis externa of left ear 11/13/2019  .  Acute suppurative otitis media of left ear without spontaneous rupture of tympanic membrane 11/13/2019  . Anemia    low iron  . Aortic stenosis 05/03/2018  . Aortic valve abscess 04/13/2020  . ARF (acute renal failure) ([place]) 03/20/2020  . Arthritis 02/15/2018   Overview:  shoulder  . Bacteremia due to Enterococcus 03/21/2020  . Cervical myelopathy ([place]) 04/22/2019  . Conductive hearing loss of left ear 11/13/2019  . Coronary artery disease involving native coronary artery of native heart without angina pectoris 05/30/2016  . Coronary artery disease of native artery of native heart with stable angina pectoris ([place])    pt. denies  . Diabetes mellitus due to underlying condition with unspecified complications ([place]) 1/96/2229  . Discitis 04/07/2020  . Dyslipidemia 05/30/2016  . Dyspnea    upon exertion,  as of 06/05/09 not having any recent chest pain  . Dysrhythmia    a fib  . Endocarditis 04/13/2020  . Essential hypertension 05/30/2016  . Heart murmur    Has had an Aortic valve replacement  . History of fusion of cervical spine 03/23/2020  . Hyperactive gag reflex 02/15/2018  . Hypothyroidism   . Intermittent claudication ([place]) 10/31/2018  . Lumbar stenosis with neurogenic claudication 06/11/2019  . Macular degeneration 02/15/2018  . PAF (paroxysmal atrial fibrillation) ([place]) 05/30/2016  . Pre-operative cardiovascular examination 01/03/2020  . Preoperative cardiovascular examination 04/17/2019  . Prosthetic valve endocarditis (  [place]) 04/13/2020  . Protein-calorie malnutrition, severe 03/24/2020  . Pseudogout of left knee 03/21/2020  . Renal insufficiency 01/17/2020  . S/P aortic valve replacement 01/17/2020  . S/P AVR 01/17/2020  . Septic arthritis ([place]) 03/23/2020  . Tinnitus of left ear 11/13/2019  . Typical atrial flutter ([facility])     Family History   family history includes Brain cancer in his mother; Heart disease in his maternal grandmother; Prostate cancer in his brother.  Prior  Rehab/Hospitalizations Has the patient had prior rehab or hospitalizations prior to admission? Yes  Has the patient had major surgery during 100 days prior to admission? Yes   Current Medications  Current Facility-Administered Medications:  .  acetaminophen (TYLENOL) tablet 650 mg, 650 mg, Oral, Q6H PRN **OR** acetaminophen (TYLENOL) suppository 650 mg, 650 mg, Rectal, Q6H PRN, Smith, Rondell A, MD .  albuterol (PROVENTIL) (2.5 MG/3ML) 0.083% nebulizer solution 2.5 mg, 2.5 mg, Nebulization, Q6H PRN, Tamala Julian, Rondell A, MD .  amiodarone (PACERONE) tablet 200 mg, 200 mg, Oral, BID, 200 mg at 05/13/20 0941 **FOLLOWED BY** [START ON 05/24/2020] amiodarone (PACERONE) tablet 200 mg, 200 mg, Oral, Daily, Thurnell Lose, MD .  Derrill Memo ON 05/21/2020] amoxicillin (AMOXIL) capsule 500 mg, 500 mg, Oral, Q12H, Dixon, Stephanie N, NP .  ampicillin (OMNIPEN) 2 g in sodium chloride 0.9 % 100 mL IVPB, 2 g, Intravenous, Q4H, Dixon, Stephanie N, NP, Last Rate: 300 mL/hr at 05/13/20 1433, 2 g at 05/13/20 1433 .  cefTRIAXone (ROCEPHIN) 2 g in sodium chloride 0.9 % 100 mL IVPB, 2 g, Intravenous, Q12H, Dixon, Stephanie N, NP, Last Rate: 200 mL/hr at 05/13/20 0517, 2 g at 05/13/20 0517 .  Chlorhexidine Gluconate Cloth 2 % PADS 6 each, 6 each, Topical, Daily, Norval Morton, MD, 6 each at 05/12/20 1508 .  cyclobenzaprine (FLEXERIL) tablet 5 mg, 5 mg, Oral, TID PRN, Fuller Plan A, MD, 5 mg at 05/10/20 2118 .  feeding supplement (ENSURE ENLIVE) (ENSURE ENLIVE) liquid 237 mL, 237 mL, Oral, TID BM, Smith, Rondell A, MD, 237 mL at 05/13/20 0955 .  ferrous [id number] C-folic acid (TRINSICON / FOLTRIN) capsule 1 capsule, 1 capsule, Oral, Q breakfast, Tamala Julian, Rondell A, MD, 1 capsule at 05/13/20 0941 .  ferrous sulfate tablet 325 mg, 325 mg, Oral, BID WC, Thurnell Lose, MD, 325 mg at 05/13/20 0941 .  insulin aspart (novoLOG) injection 0-20 Units, 0-20 Units, Subcutaneous, TID WC, Fuller Plan A, MD, 3 Units at  05/13/20 1215 .  insulin aspart (novoLOG) injection 0-5 Units, 0-5 Units, Subcutaneous, QHS, Fuller Plan A, MD, 3 Units at 05/02/20 2130 .  insulin glargine (LANTUS) injection 10 Units, 10 Units, Subcutaneous, BID, Jonetta Osgood, MD, 10 Units at 05/13/20 [phone] .  levothyroxine (SYNTHROID) tablet 25 mcg, 25 mcg, Oral, QAC breakfast, Fuller Plan A, MD, 25 mcg at 05/13/20 0516 .  morphine 2 MG/ML injection 1-2 mg, 1-2 mg, Intravenous, Q4H PRN, Jonetta Osgood, MD, 2 mg at 05/13/20 1034 .  oxyCODONE (Oxy IR/ROXICODONE) immediate release tablet 5 mg, 5 mg, Oral, Q4H PRN, Fuller Plan A, MD, 5 mg at 05/13/20 0158 .  oxyCODONE (OXYCONTIN) 12 hr tablet 10 mg, 10 mg, Oral, Q12H, Smith, Rondell A, MD, 10 mg at 05/13/20 0941 .  polyethylene glycol (MIRALAX / GLYCOLAX) packet 17 g, 17 g, Oral, Daily, Ghimire, Henreitta Leber, MD, 17 g at 05/12/20 0800 .  pregabalin (LYRICA) capsule 100 mg, 100 mg, Oral, TID, Tamala Julian, Rondell A, MD, 100 mg at 05/13/20 0941 .  senna (  SENOKOT) tablet 8.6 mg, 1 tablet, Oral, QHS, Smith, Rondell A, MD, 8.6 mg at 05/12/20 2151 .  sodium chloride flush (NS) 0.9 % injection 10-40 mL, 10-40 mL, Intracatheter, Q12H, Smith, Rondell A, MD, 20 mL at 05/13/20 0956  Patients Current Diet:  Diet Order            Diet Carb Modified Fluid consistency: Thin; Room service appropriate? Yes  Diet effective now                 Precautions / Restrictions Precautions Precautions: Sternal, Fall, Other (comment) Precaution Comments: freq cues to adhere to sternal prec Spinal Brace: Lumbar corset Other Brace: for comfort, pt reports "it doesn't really help me" so we didn't use back brace Restrictions Weight Bearing Restrictions: No Other Position/Activity Restrictions: sternal precautions   Has the patient had 2 or more falls or a fall with injury in the past year? No  Prior Activity Level Community (5-7x/wk): up until ACDF in April, pt was independent, but has been significantly  limited by pain since that time, and with prolonged hospitalization (since 4/29) is profoundly debilitated  Prior Functional Level Self Care: Did the patient need help bathing, dressing, using the toilet or eating? Independent  Indoor Mobility: Did the patient need assistance with walking from room to room (with or without device)? Independent  Stairs: Did the patient need assistance with internal or external stairs (with or without device)? Independent  Functional Cognition: Did the patient need help planning regular tasks such as shopping or remembering to take medications? Independent  Home Assistive Devices / Equipment Home Equipment: Grab bars - tub/shower, Shower seat - built in, [place] - single point, Environmental consultant - 2 wheels  Prior Device Use: Indicate devices/aids used by the patient prior to current illness, exacerbation or injury? Walker  Current Functional Level Cognition  Overall Cognitive Status: Within Functional Limits for tasks assessed Orientation Level: Oriented X4 General Comments: cognition not specifically tested    Extremity Assessment (includes Sensation/Coordination)  Upper Extremity Assessment: Generalized weakness  Lower Extremity Assessment: Defer to PT evaluation RLE Deficits / Details: Ankle, Hip grossly WFL; substantial VAC dressing surrounding R knee; very painful RLE: Unable to fully assess due to pain LLE Deficits / Details: anke and hip grossly WFL; knee with substantial VAC dressing     ADLs  Overall ADL's : Needs assistance/impaired Eating/Feeding: Set up, Bed level Grooming: Set up, Bed level Upper Body Bathing: Moderate assistance, Bed level Lower Body Bathing: Total assistance, Bed level Upper Body Dressing : Minimal assistance, Sitting Lower Body Dressing: Total assistance, Bed level Toilet Transfer: Moderate assistance, Maximal assistance, +2 for physical assistance, +2 for safety/equipment, RW, Stand-pivot Toilet Transfer Details (indicate cue  type and reason): simulated to recliner; cues for safety to maintain sternal precautions during mobility Toileting- Clothing Manipulation and Hygiene: Total assistance, Bed level Functional mobility during ADLs: Minimal assistance General ADL Comments: [place] don seated EOB tolerance to prep for OOB ADLs    Mobility  Overal bed mobility: Needs Assistance Bed Mobility: Supine to Sit, Sit to Supine Rolling: Mod assist (rail) Sidelying to sit: Mod assist (HOB elevated) Supine to sit: Mod assist, HOB elevated Sit to supine: Mod assist Sit to sidelying: Mod assist, +2 for safety/equipment General bed mobility comments: pt wanted to try [facility] elevated and "walk" his legs off side of bed (felt his back could handle this technique), required mod assist to advance hips to edge of bed as pt unable to push with UEs to advance  his pelvis towards EOB, and could not "walk" each hip forward with lateral leaning    Transfers  Overall transfer level: Needs assistance Equipment used: None Transfer via Lift Equipment: Marketing executive Transfers: Sit to/from Stand Sit to Stand: +2 safety/equipment (stood with motorized lift) Stand pivot transfers: Mod assist  Lateral/Scoot Transfers: Mod assist General transfer comment: bed elevated due to lack of knee flexion; on return to sit at EOB, bed remained elevated due to decr knee flexion    Ambulation / Gait / Stairs / Wheelchair Mobility  Ambulation/Gait Ambulation/Gait assistance: +2 safety/equipment, Min assist (assist with Sara-Plus) Gait Distance (Feet): 16 Feet (forward and backward 4 ft x2; seated rest and repeated x1) Assistive device:  (Sara-Plus) Gait Pattern/deviations: Step-to pattern, Decreased stride length, Trunk flexed General Gait Details: 2 person assist to guide Sara-Plus and manage IV/monitor; distance limited by need to be able to pull bed up if needed in order to have elevated surface for pt to return to sitting Gait velocity: decreased Gait  velocity interpretation: <1.31 ft/sec, indicative of household ambulator    Posture / Balance Dynamic Sitting Balance Sitting balance - Comments: sat eOB for LE exercises and balance x 10 minutes Balance Overall balance assessment: Needs assistance Sitting-balance support: Feet supported, No upper extremity supported Sitting balance-Leahy Scale: Fair Sitting balance - Comments: sat eOB for LE exercises and balance x 10 minutes Standing balance support: Bilateral upper extremity supported, Single extremity supported, During functional activity Standing balance-Leahy Scale: Poor Standing balance comment: reliant on external assist or AD    Special needs/care consideration Skin incisions to bilat knees, sternal incision healed, Behavioral consideration would benefit from Neuropsych for coping and Designated visitor wife, [place] (from acute therapy documentation) Living Arrangements: Spouse/significant other  Lives With: Spouse Available Help at Discharge: Family, Available 24 hours/day Type of Home: House Home Layout: One level Home Access: Stairs to enter Entrance Stairs-Rails: Right, Left Entrance Stairs-Number of Steps: 8 Bathroom Shower/Tub: Multimedia programmer: Handicapped height Bathroom Accessibility: Yes How Accessible: Accessible via walker Additional Comments: Grab bars in shower with built in seat, no grab bars near toilet  Discharge Living Setting Plans for Discharge Living Setting: Patient's home Type of Home at Discharge: House Discharge Home Layout: One level Discharge Home Access: Stairs to enter Entrance Stairs-Rails: Right, Left Entrance Stairs-Number of Steps: 7 Discharge Bathroom Shower/Tub: Walk-in shower, Tub only Discharge Bathroom Toilet: Standard Discharge Bathroom Accessibility: Yes How Accessible: Accessible via walker Does the patient have any problems obtaining your medications?: No  Social/Family/Support  Systems Patient Roles: Spouse Anticipated Caregiver: Wife- intends to take FMLA Anticipated Caregiver's Contact Information: [name] [phone] Ability/Limitations of Caregiver: None reported Caregiver Availability: 24/7 Discharge Plan Discussed with Primary Caregiver: Yes Is Caregiver In Agreement with Plan?: Yes Does Caregiver/Family have Issues with Lodging/Transportation while Pt is in Rehab?: No  Goals Patient/Family Goal for Rehab: PT/OT supervision to mod I, SLP n/a Expected length of stay: 16-19 days Additional Information: multiple admissions acute<>rehab Pt/Family Agrees to Admission and willing to participate: Yes Program Orientation Provided & Reviewed with Pt/Caregiver Including Roles  & Responsibilities: Yes  Decrease burden of Care through IP rehab admission: n/a  Possible need for SNF placement upon discharge: Not anticipated  Patient Condition: I have reviewed medical records from [facility], spoken with CM, and patient and spouse. I met with patient at the bedside for inpatient rehabilitation assessment.  Patient will benefit from ongoing PT and OT, can actively participate in 3 hours  of therapy a day 5 days of the week, and can make measurable gains during the admission.  Patient will also benefit from the coordinated team approach during an Inpatient Acute Rehabilitation admission.  The patient will receive intensive therapy as well as Rehabilitation physician, nursing, social worker, and care management interventions.  Due to safety, skin/wound care, disease management, medication administration, pain management and patient education the patient requires 24 hour a day rehabilitation nursing.  The patient is currently mod assist +2 with mobility and basic ADLs.  Discharge setting and therapy post discharge at home with home health is anticipated.  Patient has agreed to participate in the Acute Inpatient Rehabilitation Program and will admit today.  Preadmission  Screen Completed By:  Michel Santee, PT, DPT 05/13/2020 3:15 PM ______________________________________________________________________   Discussed status with Dr. Dagoberto Ligas on 05/13/20  at 3:15 PM  and received approval for admission today.  Admission Coordinator:  Michel Santee, PT, DPT time 3:15 PM Sudie Grumbling 05/13/20    Assessment/Plan: Diagnosis: 1. Does the need for close, 24 hr/day Medical supervision in concert with the patient's rehab needs make it unreasonable for this patient to be served in a less intensive setting? Yes 2. Co-Morbidities requiring supervision/potential complications: discitis/osteomyelitis, B/L septic knees, A Fib, severe pain 3. Due to bladder management, bowel management, safety, skin/wound care, disease management, medication administration, pain management and patient education, does the patient require 24 hr/day rehab nursing? Yes 4. Does the patient require coordinated care of a physician, rehab nurse, PT, OT, and SLP to address physical and functional deficits in the context of the above medical diagnosis(es)? Yes Addressing deficits in the following areas: balance, endurance, locomotion, strength, transferring, bathing, dressing, feeding, grooming and toileting 5. Can the patient actively participate in an intensive therapy program of at least 3 hrs of therapy 5 days a week? Yes 6. The potential for patient to make measurable gains while on inpatient rehab is good 7. Anticipated functional outcomes upon discharge from inpatient rehab: modified independent and supervision PT, modified independent and supervision OT, n/a SLP 8. Estimated rehab length of stay to reach the above functional goals is: 16-19 days 9. Anticipated discharge destination: Home 10. Overall Rehab/Functional Prognosis: good   MD Signature:

## 2020-05-12 NOTE — Progress Notes (Signed)
Inpatient Rehab Admissions Coordinator:   Met with pt and wife at bedside. Still awaiting insurance determination; per representative today case is still pending.  Will continue to follow.   Shann Medal, PT, DPT Admissions Coordinator [phone] 05/12/20  3:14 PM

## 2020-05-13 LAB — CBC WITH DIFFERENTIAL/PLATELET
Abs Immature Granulocytes: 0.07 10*3/uL (ref 0.00–0.07)
Basophils Absolute: 0.1 10*3/uL (ref 0.0–0.1)
Basophils Relative: 1 %
Eosinophils Absolute: 0.4 10*3/uL (ref 0.0–0.5)
Eosinophils Relative: 5 %
HCT: 25.7 % — ABNORMAL LOW (ref 39.0–52.0)
Hemoglobin: 7.8 g/dL — ABNORMAL LOW (ref 13.0–17.0)
Immature Granulocytes: 1 %
Lymphocytes Relative: 10 %
Lymphs Abs: 0.9 10*3/uL (ref 0.7–4.0)
MCH: 27.9 pg (ref 26.0–34.0)
MCHC: 30.4 g/dL (ref 30.0–36.0)
MCV: 91.8 fL (ref 80.0–100.0)
Monocytes Absolute: 0.6 10*3/uL (ref 0.1–1.0)
Monocytes Relative: 6 %
Neutro Abs: 6.9 10*3/uL (ref 1.7–7.7)
Neutrophils Relative %: 77 %
Platelets: 391 10*3/uL (ref 150–400)
RBC: 2.8 MIL/uL — ABNORMAL LOW (ref 4.22–5.81)
RDW: 17.6 % — ABNORMAL HIGH (ref 11.5–15.5)
WBC: 8.8 10*3/uL (ref 4.0–10.5)
nRBC: 0 % (ref 0.0–0.2)

## 2020-05-13 LAB — COMPREHENSIVE METABOLIC PANEL
ALT: 59 U/L — ABNORMAL HIGH (ref 0–44)
AST: 46 U/L — ABNORMAL HIGH (ref 15–41)
Albumin: 1.8 g/dL — ABNORMAL LOW (ref 3.5–5.0)
Alkaline Phosphatase: 90 U/L (ref 38–126)
Anion gap: 9 (ref 5–15)
BUN: 19 mg/dL (ref 8–23)
CO2: 26 mmol/L (ref 22–32)
Calcium: 8.3 mg/dL — ABNORMAL LOW (ref 8.9–10.3)
Chloride: 100 mmol/L (ref 98–111)
Creatinine, Ser: 1.25 mg/dL — ABNORMAL HIGH (ref 0.61–1.24)
GFR calc Af Amer: >60 mL/min (ref >60)
GFR calc non Af Amer: 59 mL/min — ABNORMAL LOW (ref >60)
Glucose, Bld: 128 mg/dL — ABNORMAL HIGH (ref 70–99)
Potassium: 3.9 mmol/L (ref 3.5–5.1)
Sodium: 135 mmol/L (ref 135–145)
Total Bilirubin: 0.8 mg/dL (ref 0.3–1.2)
Total Protein: 5 g/dL — ABNORMAL LOW (ref 6.5–8.1)

## 2020-05-13 LAB — GLUCOSE, CAPILLARY
Glucose-Capillary: 120 mg/dL — ABNORMAL HIGH (ref 70–99)
Glucose-Capillary: 150 mg/dL — ABNORMAL HIGH (ref 70–99)
Glucose-Capillary: 162 mg/dL — ABNORMAL HIGH (ref 70–99)
Glucose-Capillary: 146 mg/dL — ABNORMAL HIGH (ref 70–99)

## 2020-05-13 LAB — BRAIN NATRIURETIC PEPTIDE: B Natriuretic Peptide: 146.7 pg/mL — ABNORMAL HIGH (ref 0.0–100.0)

## 2020-05-13 LAB — MAGNESIUM: Magnesium: 2.1 mg/dL (ref 1.7–2.4)

## 2020-05-13 MED ORDER — FERROUS SULFATE 325 (65 FE) MG PO TABS
325.0000 mg | ORAL_TABLET | Freq: Two times a day (BID) | ORAL | Status: DC
  Administered 2020-05-13 – 2020-05-14 (×3): 325 mg via ORAL
  Filled 2020-05-13 (×3): qty 1

## 2020-05-13 NOTE — Progress Notes (Signed)
PROGRESS NOTE        PATIENT DETAILS Name: Johnathan Murray Age: 69 y.o. Sex: male Date of Birth: 1951-02-02 Admit Date: 05/02/2020 Admitting Physician Norval Morton, MD ZOX:[id number], [name], MD  Brief Narrative: Patient is a 69 y.o. 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  Patient was seen by cardiothoracic surgery-underwent debridement of aortic root abscess/aortic valve replacement with bioprosthetic valve-ID was consulted-patient was subsequently discharged to CIR on 6/10.  While at CIR-patient started having gradually worsening anemia, bilateral knee pain/swelling-arthrocentesis was suggestive of septic knee-patient subsequently underwent irrigation/debridement and total synovectomy of bilateral knees-postoperatively-patient was noted to have A. fib with RVR and severe anemia due to blood loss from left knee bleeding-and readmitted to the hospitalist service.  See below for further details.  Significant events: 4/29-5/18>> admit by [facility] for sepsis secondary to Enterococcus faecalis, found to have acute osteo/discitis of L/-/5, left knee septic arthritis-discharge to CIR  5/18-5/24>> at CIR-aortic root abscess/prosthetic aortic valve vegetation on TEE (done for cardioversion) 5/24-6/10>> admit by [facility] from CIR-for aortic root replacement-discharged back to CIR. 6/10-6/12>> at CIR- recurrent bilateral knee septic arthritis on arthrocentesis-ortho reconsulted 6/12-current>> admit to [facility] for A. fib with RVR, severe  anemia following bilateral knee washout for septic arthritis.  Significant postoperative bleeding from left knee.  Significant studies: 4/29>> CT abdomen/pelvis: No acute intra-abdominal or pelvic pathology 4/29>> CT C-spine: Interval removal of fixating plate/screws from C4-C6, no acute osseous abnormality 4/30>>left lower extremity Doppler: No DVT, ruptured Baker's cyst 5/3>> MRI C-spine: No evidence of infection, s/p C3-4 discectomy/fusion. 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  Procedures : 6/12>> irrigation/debridement with total synovectomy of bilateral knees 6/9>> TEE guided cardioversion 5/27>>S/p Redo median sternotomy, rightaxillaryartery graftfor cardiopulmonary bypass, extracorporeal circulation, debridement of aortic root abscess and pseudoaneurysm, and aortic root replacement using a 85mm Homograft aortic root with reimplantation of coronary arteries. 5/24>> TEE guided cardioversion.  Found to have vegetation on prosthetic aortic valve on TEE. 5/18>> fluoroscopy guided needle aspiration of L4-L5 disc space 5/4>> B/L knee irrigation irrigation debridement arthroscopic. B/L knee medial and lateral menisectomies.  Antimicrobial therapy: Ampicillin: 5/18>> Rocephin: 5/19>>  Microbiology data: 6/12>> knee culture: No growth 6/10>> left knee synovial fluid culture: No  growth  5/27>> aortic valve tissue culture: No growth 5/26>> blood culture: No growth 5/22>> urine culture: No growth 5/19>> left synovitic fluid culture: No growth 5/18>> L4-L5 disc aspirate: Enterococcus faecalis 5/6>> blood culture: No growth 5/3>> blood culture: No growth 5/2>> right knee synovial fluid: No growth 5/1>> left knee synovial fluid: Enterococcus  faecalis 4/30>> blood culture: Enterococcus faecalis 4/29>> urine culture: No growth  Consults: ID [facility] Cardiology Ortho  DVT Prophylaxis : Eliquis  Subjective:  Patient in bed, appears comfortable, denies any headache, no fever, no chest pain or pressure, no shortness of breath , no abdominal pain. No focal weakness.     Assessment/Plan:  Hemorrhagic shock 2/2 to post op bleeding from left knee with acute blood loss anemia superimposed on anemia of chronic disease: Shock physiology has resolved-transfused a total of 5 units of PRBC this admission.  Left knee bleeding seems to have essentially resolved. Coagulation was cautiously started on 05/06/2020 however on 05/10/2020 he likely has bled again and heparin drip has been stopped.  Will transfuse another 2 units of packed RBCs on 05/10/2020.  Case has been discussed with Dr. Sharol Given orthopedics he is following the patient closely.  We will think for now seems to have resolved on 05/11/2021 will monitor closely.  For now continue to hold anticoagulation.   A. fib with RVR with [place] vas 2 score of greater than 3: Rate controlled but remains in atrial fibrillation-continue amiodarone, low-dose beta-blocker had to be discontinued on 05/12/2020 due to extremely soft blood pressures.  Cardiology following, anticoagulation as above.  Patient and family understand the risks and benefits of holding anticoagulation or using it.  Enterococcus bacteremia-with L4-L5 discitis, bilateral septic knee, prosthetic valve vegetation with aortic root abscess: Evaluated by infectious  disease-recommendations are to continue ceftriaxone/ampicillin till 05/20/2020-and then to start p.o. amoxicillin 500 mg twice daily from 05/21/2020.  Appointment made with Dr. Megan Salon on 06/03/2020 at 2:45 PM.    CAD-s/p CABG-no anginal symptoms-continue supportive care  Hypothyroidism: Continue levothyroxine  DM-2 with uncontrolled hyperglycemia: CBGs relatively stable-continue Lantus 10 units twice daily, SSI.  Follow and adjust.     Recent Labs    05/12/20 1603 05/12/20 2114 05/13/20 0740  GLUCAP 129* 195* 120*   Debility/deconditioning: Secondary to acute illness-we will need PT/OT-plans are to discharge back to CIR once insurance authorization/bed available..   Diet: Diet Order            Diet Carb Modified Fluid consistency: Thin; Room service appropriate? Yes  Diet effective now                Code Status: Full code   Family Communication: Spouse at bedside on 05/09/2020  Disposition Plan: Status is: Inpatient  Remains inpatient appropriate because:Inpatient level of care appropriate due to severity of illness  Dispo: The patient is from: Home              Anticipated d/c is to: CIR              Anticipated d/c date is: > 1 days              Patient currently is  medically stable to d/c.   Barriers to Discharge: Awaiting CIR bed/insurance authorization  Antimicrobial agents: Anti-infectives (From admission, onward)   Start     Dose/Rate Route Frequency Ordered Stop   05/21/20 1000  amoxicillin (AMOXIL) capsule 500 mg     Discontinue     500 mg Oral Every 12 hours 05/06/20 1103     05/21/20 0000  amoxicillin (AMOXIL) 500 MG capsule     Discontinue    Note to Pharmacy: Put on file. Patient going to CIR prior to discharge   500 mg Oral 2 times daily 05/06/20 1109 09/18/20 2359   05/03/20 0500  cefTRIAXone (ROCEPHIN) 2 g in sodium  chloride 0.9 % 100 mL IVPB     Discontinue     2 g 200 mL/hr over 30 Minutes Intravenous Every 12 hours 05/02/20 2155 05/20/20 2359     05/02/20 2245  cefTRIAXone (ROCEPHIN) 2 g in sodium chloride 0.9 % 100 mL IVPB  Status:  Discontinued        2 g 200 mL/hr over 30 Minutes Intravenous Every 12 hours 05/02/20 1943 05/02/20 2155   05/02/20 1800  ampicillin (OMNIPEN) 2 g in sodium chloride 0.9 % 100 mL IVPB     Discontinue     2 g 300 mL/hr over 20 Minutes Intravenous Every 4 hours 05/02/20 1745 05/20/20 2359       Time spent: 25 minutes-Greater than 50% of this time was spent in counseling, explanation of diagnosis, planning of further management, and coordination of care.  MEDICATIONS: Scheduled Meds: . amiodarone  200 mg Oral BID   Followed by  . [START ON 05/24/2020] amiodarone  200 mg Oral Daily  . [START ON 05/21/2020] amoxicillin  500 mg Oral Q12H  . Chlorhexidine Gluconate Cloth  6 each Topical Daily  . feeding supplement (ENSURE ENLIVE)  237 mL Oral TID BM  . ferrous [id number] C-folic acid  1 capsule Oral Q breakfast  . ferrous sulfate  325 mg Oral BID WC  . insulin aspart  0-20 Units Subcutaneous TID WC  . insulin aspart  0-5 Units Subcutaneous QHS  . insulin glargine  10 Units Subcutaneous BID  . levothyroxine  25 mcg Oral QAC breakfast  . metoprolol tartrate  12.5 mg Oral BID  . oxyCODONE  10 mg Oral Q12H  . polyethylene glycol  17 g Oral Daily  . pregabalin  100 mg Oral TID  . senna  1 tablet Oral QHS  . sodium chloride flush  10-40 mL Intracatheter Q12H   Continuous Infusions: . ampicillin (OMNIPEN) IV 2 g (05/13/20 0947)  . cefTRIAXone (ROCEPHIN) IVPB 2 gram/100 mL NS (Mini-Bag Plus) 2 g (05/13/20 0517)   PRN Meds:.acetaminophen **OR** acetaminophen, albuterol, cyclobenzaprine, morphine injection, oxyCODONE   PHYSICAL EXAM: Vital signs: Vitals:   05/13/20 0402 05/13/20 0449 05/13/20 0520 05/13/20 0809  BP:  93/68    Pulse:  (!) 116    Resp:  17    Temp: 98.1 F (36.7 C) 98.1 F (36.7 C)  97.9 F (36.6 C)  TempSrc:  Oral  Oral  SpO2:  100%    Weight:   90.6 kg   Height:        Filed Weights   05/11/20 0444 05/12/20 0555 05/13/20 0520  Weight: 91.5 kg 90.5 kg 90.6 kg   Body mass index is 28.66 kg/m.   Gen Exam:  Awake Alert, No new F.N deficits, Normal affect [place].AT,PERRAL Supple Neck,No JVD, No cervical lymphadenopathy appriciated.  Symmetrical Chest wall movement, Good air movement bilaterally, CTAB RRR,No Gallops, positive systolic heart murmur , No Parasternal Heave +ve B.Sounds, Abd Soft, No tenderness, No organomegaly appriciated, No rebound - guarding or rigidity. bilateral moderate knee swelling, minimal bleeding around the left knee incision site, both incision sites appear stable no signs of hematoma on exam i.e. no discoloration   I have personally reviewed following labs and imaging studies  LABORATORY DATA: CBC: Recent Labs  Lab 05/09/20 1124 05/09/20 1124 05/10/20 0409 05/10/20 2155 05/11/20 0415 05/12/20 0340 05/13/20 0527  WBC 9.2  --  8.5  --  10.9* 8.4 8.8  NEUTROABS  --   --  6.3  --  8.5*  6.3 6.9  HGB 7.1*   < > 6.2* 8.5* 8.4* 8.0* 7.8*  HCT 23.4*   < > 20.4* 27.0* 26.7* 25.5* 25.7*  MCV 92.9  --  93.2  --  89.9 90.7 91.8  PLT 345  --  324  --  359 373 391   < > = values in this interval not displayed.    Basic Metabolic Panel: Recent Labs  Lab 05/09/20 0829 05/10/20 0409 05/11/20 0415 05/12/20 0340 05/13/20 0527  NA 135 132* 135 137 135  K 4.2 4.0 4.1 3.8 3.9  CL 101 99 100 101 100  CO2 27 26 26 27 26   GLUCOSE 190* 183* 130* 92 128*  BUN 15 16 18 18 19   CREATININE 1.25* 1.31* 1.29* 1.24 1.25*  CALCIUM 8.4* 8.0* 8.3* 8.4* 8.3*  MG 1.9 1.9 2.0 2.1 2.1    GFR: Estimated Creatinine Clearance: 64.9 mL/min (A) (by C-G formula based on SCr of 1.25 mg/dL (H)).  Liver Function Tests: Recent Labs  Lab 05/10/20 0409 05/11/20 0415 05/12/20 0340 05/13/20 0527  AST 22 26 32 46*  ALT 33 37 41 59*  ALKPHOS 74 89 90 90  BILITOT 0.5 1.0 1.2 0.8  PROT 4.7* 5.1* 5.1* 5.0*  ALBUMIN 1.7* 1.9* 1.8* 1.8*   No  results for input(s): LIPASE, AMYLASE in the last 168 hours. No results for input(s): AMMONIA in the last 168 hours.  Coagulation Profile: No results for input(s): INR, PROTIME in the last 168 hours.  Cardiac Enzymes: No results for input(s): CKTOTAL, CKMB, CKMBINDEX, TROPONINI in the last 168 hours.  BNP (last 3 results) No results for input(s): PROBNP in the last 8760 hours.  Lipid Profile: No results for input(s): CHOL, HDL, LDLCALC, TRIG, CHOLHDL, LDLDIRECT in the last 72 hours.  Thyroid Function Tests: No results for input(s): TSH, T4TOTAL, FREET4, T3FREE, THYROIDAB in the last 72 hours.  Anemia Panel: No results for input(s): VITAMINB12, FOLATE, FERRITIN, TIBC, IRON, RETICCTPCT in the last 72 hours.  Urine analysis:    Component Value Date/Time   COLORURINE YELLOW 04/11/2020 2245   APPEARANCEUR CLEAR 04/11/2020 2245   LABSPEC 1.009 04/11/2020 2245   PHURINE 7.0 04/11/2020 2245   GLUCOSEU NEGATIVE 04/11/2020 2245   HGBUR NEGATIVE 04/11/2020 2245   BILIRUBINUR NEGATIVE 04/11/2020 2245   KETONESUR NEGATIVE 04/11/2020 2245   PROTEINUR NEGATIVE 04/11/2020 2245   NITRITE NEGATIVE 04/11/2020 2245   LEUKOCYTESUR NEGATIVE 04/11/2020 2245    Sepsis Labs: Lactic Acid, Venous    Component Value Date/Time   LATICACIDVEN 1.0 03/20/2020 1517    MICROBIOLOGY: No results found for this or any previous visit (from the past 240 hour(s)).  RADIOLOGY STUDIES/RESULTS: No results found.   LOS: 11 days   Lala Lund, MD  [facility]    To contact the attending provider between 7A-7P or the covering provider during after hours 7P-7A, please log into the web site [email or web address] and access using universal [place] password for that web site. If you do not have the password, please call the hospital operator.  05/13/2020, 11:41 AM

## 2020-05-13 NOTE — Progress Notes (Signed)
Patient is 11 days status post bilateral knee irrigation and debridement.  He has had significant bleeding from his left knee after surgery and wound VAC was removed.  Did have return of some bleeding and anticoagulation started  Currently no new bleeding.  Patient is wearing dressings from yesterday and they have no drainage.  Beneath the dressings just a spot of blood wound is healing well.  No new effusion can be appreciated swelling controlled  Patient is doing well and anticipates being discharged to rehab tomorrow or today.  He did tell me that he walked on his legs.  Patient will need follow-up in our office.  Would anticipate removing staples in 1 week

## 2020-05-13 NOTE — Progress Notes (Signed)
Physical Therapy Treatment Patient Details Name: Johnathan Murray MRN: [id number] DOB: 02-Jun-1951 Today's Date: 05/13/2020    History of Present Illness Patient is a 70 y.o. 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  Patient was seen by cardiothoracic surgery-underwent debridement of aortic root abscess/aortic valve replacement with bioprosthetic valve 04/17/20 via median sternotomy-ID was consulted-patient was subsequently discharged to [place] on 6/10.  While at CIR-patient started having gradually worsening anemia, bilateral knee pain/swelling-arthrocentesis was suggestive of septic knee-patient subsequently underwent irrigation/debridement and total synovectomy of bilateral knees-post procedure-patient was noted to have A. fib with RVR and severe anemia-and readmitted to the hospitalist service.  post op bleeding from left knee with acute blood loss anemia superimposed on anemia of chronic disease    PT Comments    Patient tolerated further distance ambulation with Sara-plus. Barriers to transfers continues to be lack of bil knee ROM into flexion and sternal precautions limiting ability to push up with his arms. Utilizing elevated bed for transfers (and motorized lift). Wife and pt continue to work on knee ROM exercises, however pt is hesitant due to excessive pain over several days after aggressive exercises. Patient also remains at risk for bleeding from knee incisions (esp if anti-coagulation restarted).     Follow Up Recommendations  CIR      Equipment Recommendations  None recommended by PT    Recommendations for Other Services OT consult     Precautions / Restrictions Precautions Precautions: Sternal;Fall;Other (comment) Precaution Comments: freq cues to adhere to sternal prec Required Braces or Orthoses: Spinal Brace Spinal Brace: Lumbar corset Other Brace: for comfort, pt reports "it doesn't really help me" so we didn't use back brace Restrictions Other Position/Activity Restrictions: sternal precautions    Mobility  Bed Mobility Overal bed mobility: Needs Assistance Bed Mobility: Supine to Sit;Sit to Supine     Supine to sit: Mod assist;HOB elevated Sit to supine: Mod assist   General bed mobility comments: pt wanted to try [facility] elevated and "walk" his legs off side of bed (felt his back could handle this technique), required mod assist to advance hips to edge of bed as pt unable to push with UEs to advance his pelvis towards EOB, and could not "walk" each hip forward with lateral leaning  Transfers Overall transfer level: Needs assistance   Transfers: Sit to/from Stand Sit to Stand: +2 safety/equipment (stood with motorized lift)         General transfer comment: bed elevated due to lack of knee flexion; on return to sit at EOB, bed remained elevated due to decr knee flexion  Ambulation/Gait Ambulation/Gait assistance: +2 safety/equipment;Min assist (assist with Sara-Plus) Gait Distance (Feet): 16 Feet (forward and backward 4 ft x2; seated rest and repeated x1) Assistive device:  (Sara-Plus) Gait Pattern/deviations: Step-to pattern;Decreased stride length;Trunk flexed Gait velocity: decreased   General Gait Details: 2 person assist to guide Sara-Plus and manage IV/monitor; distance limited by need to be able to pull bed up if needed in order to have elevated surface for pt to return to sitting   Stairs             Wheelchair  Mobility    Modified Rankin (Stroke Patients Only)        Balance Overall balance assessment: Needs assistance Sitting-balance support: Feet supported;No upper extremity supported Sitting balance-Leahy Scale: Fair     Standing balance support: Bilateral upper extremity supported;Single extremity supported;During functional activity Standing balance-Leahy Scale: Poor Standing balance comment: reliant on external assist or AD                            Cognition Arousal/Alertness: Awake/alert Behavior During Therapy: WFL for tasks assessed/performed;Anxious Overall Cognitive Status: Within Functional Limits for tasks assessed                                 General Comments: cognition not specifically tested      Exercises General Exercises - Lower Extremity Ankle Circles/Pumps: AROM;Both;10 reps;Supine Quad Sets: AROM;Both;Supine;5 reps Heel Slides: AROM;AAROM;Both;5 reps;Supine (followed by 45 sec stretch at endrange flexion) Hip ABduction/ADduction: AROM;Both;5 reps;Supine    General Comments        Pertinent Vitals/Pain Pain Assessment: 0-10 Pain Score: 6  Faces Pain Scale:  (bil knees with weight-bearing) Pain Location: bil knee> back Pain Descriptors / Indicators: Grimacing;Guarding Pain Intervention(s): Limited activity within patient's tolerance;Monitored during session;Premedicated before session    Home Living                      Prior Function            PT Goals (current goals can now be found in the care plan section) Acute Rehab PT Goals Patient Stated Goal: to get stronger and go back to work Time For Goal Achievement: 05/17/20 Potential to Achieve Goals: Good Progress towards PT goals: Progressing toward goals    Frequency    Min 4X/week      PT Plan Current plan remains appropriate    Co-evaluation              AM-PAC PT "6 Clicks" Mobility   Outcome Measure  Help needed turning from your back to your side while in a flat bed without using bedrails?:  A Lot Help needed moving from lying on your back to sitting on the side of a flat bed without using bedrails?: A Lot Help needed moving to and from a bed to a chair (including a wheelchair)?: Total Help needed standing up from a chair using your arms (e.g., wheelchair or bedside chair)?: Total Help needed to walk in hospital room?: A Lot Help needed climbing 3-5 steps with a railing? : Total 6 Click Score: 9    End of Session Equipment Utilized During Treatment: Other (comment) (Sara-plus) Activity Tolerance: Patient limited by fatigue Patient left: in bed;with call bell/phone within reach;with family/visitor present Nurse Communication: Mobility status;Other (comment);Need for lift equipment;Precautions (used Sara-Plus) PT Visit Diagnosis: Other abnormalities of gait and mobility (R26.89);Muscle weakness (generalized) (M62.81);Pain Pain - Right/Left:  (both) Pain - part of body: Knee (and back)     Time: 1100-1143 PT Time Calculation (min) (ACUTE ONLY): 43 min  Charges:  $Gait Training: 23-37 mins $Therapeutic Exercise: 8-22 mins                      Arby Barrette, PT Pager [phone]    Rexanne Mano 05/13/2020, 12:58 PM

## 2020-05-13 NOTE — Progress Notes (Signed)
Inpatient Rehab Admissions Coordinator:   I have insurance authorization for CIR admit, but I do not have a bed available for this patient today.  Will continue to follow for potential admit when bed available.   Shann Medal, PT, DPT Admissions Coordinator [phone] 05/13/20  3:15 PM

## 2020-05-14 ENCOUNTER — Inpatient Hospital Stay (HOSPITAL_COMMUNITY)
Admission: RE | Admit: 2020-05-14 | Discharge: 2020-06-04 | DRG: 945 | Disposition: A | Discharge status: Home / Self Care | Payer: BC Managed Care – PPO | Source: Intra-hospital | Attending: Physical Medicine & Rehabilitation | Admitting: Physical Medicine & Rehabilitation

## 2020-05-14 DIAGNOSIS — R7401 Elevation of levels of liver transaminase levels: Secondary | ICD-10-CM

## 2020-05-14 DIAGNOSIS — R601 Generalized edema: Secondary | ICD-10-CM | POA: Diagnosis not present

## 2020-05-14 DIAGNOSIS — E088 Diabetes mellitus due to underlying condition with unspecified complications: Secondary | ICD-10-CM | POA: Diagnosis present

## 2020-05-14 DIAGNOSIS — N183 Chronic kidney disease, stage 3 unspecified: Secondary | ICD-10-CM | POA: Diagnosis not present

## 2020-05-14 DIAGNOSIS — Z87898 Personal history of other specified conditions: Secondary | ICD-10-CM

## 2020-05-14 DIAGNOSIS — T502X5A Adverse effect of carbonic-anhydrase inhibitors, benzothiadiazides and other diuretics, initial encounter: Secondary | ICD-10-CM | POA: Diagnosis not present

## 2020-05-14 DIAGNOSIS — Z7982 Long term (current) use of aspirin: Secondary | ICD-10-CM

## 2020-05-14 DIAGNOSIS — E8809 Other disorders of plasma-protein metabolism, not elsewhere classified: Secondary | ICD-10-CM | POA: Diagnosis present

## 2020-05-14 DIAGNOSIS — M4626 Osteomyelitis of vertebra, lumbar region: Secondary | ICD-10-CM | POA: Diagnosis not present

## 2020-05-14 DIAGNOSIS — H3530 Unspecified macular degeneration: Secondary | ICD-10-CM | POA: Diagnosis present

## 2020-05-14 DIAGNOSIS — E8770 Fluid overload, unspecified: Secondary | ICD-10-CM | POA: Diagnosis not present

## 2020-05-14 DIAGNOSIS — I952 Hypotension due to drugs: Secondary | ICD-10-CM

## 2020-05-14 DIAGNOSIS — I5033 Acute on chronic diastolic (congestive) heart failure: Secondary | ICD-10-CM | POA: Diagnosis present

## 2020-05-14 DIAGNOSIS — R7881 Bacteremia: Secondary | ICD-10-CM | POA: Diagnosis present

## 2020-05-14 DIAGNOSIS — D631 Anemia in chronic kidney disease: Secondary | ICD-10-CM | POA: Diagnosis present

## 2020-05-14 DIAGNOSIS — E1169 Type 2 diabetes mellitus with other specified complication: Secondary | ICD-10-CM | POA: Diagnosis present

## 2020-05-14 DIAGNOSIS — Z20822 Contact with and (suspected) exposure to covid-19: Secondary | ICD-10-CM | POA: Diagnosis not present

## 2020-05-14 DIAGNOSIS — N179 Acute kidney failure, unspecified: Secondary | ICD-10-CM | POA: Diagnosis not present

## 2020-05-14 DIAGNOSIS — M4647 Discitis, unspecified, lumbosacral region: Secondary | ICD-10-CM

## 2020-05-14 DIAGNOSIS — Z953 Presence of xenogenic heart valve: Secondary | ICD-10-CM

## 2020-05-14 DIAGNOSIS — L8990 Pressure ulcer of unspecified site, unspecified stage: Secondary | ICD-10-CM

## 2020-05-14 DIAGNOSIS — E440 Moderate protein-calorie malnutrition: Secondary | ICD-10-CM | POA: Diagnosis not present

## 2020-05-14 DIAGNOSIS — I4892 Unspecified atrial flutter: Secondary | ICD-10-CM | POA: Diagnosis not present

## 2020-05-14 DIAGNOSIS — R000 Tachycardia, unspecified: Secondary | ICD-10-CM | POA: Diagnosis present

## 2020-05-14 DIAGNOSIS — M00861 Arthritis due to other bacteria, right knee: Secondary | ICD-10-CM | POA: Diagnosis present

## 2020-05-14 DIAGNOSIS — I9589 Other hypotension: Secondary | ICD-10-CM

## 2020-05-14 DIAGNOSIS — I2510 Atherosclerotic heart disease of native coronary artery without angina pectoris: Secondary | ICD-10-CM | POA: Diagnosis present

## 2020-05-14 DIAGNOSIS — Z952 Presence of prosthetic heart valve: Secondary | ICD-10-CM

## 2020-05-14 DIAGNOSIS — Z981 Arthrodesis status: Secondary | ICD-10-CM

## 2020-05-14 DIAGNOSIS — I4819 Other persistent atrial fibrillation: Secondary | ICD-10-CM | POA: Diagnosis present

## 2020-05-14 DIAGNOSIS — K5903 Drug induced constipation: Secondary | ICD-10-CM

## 2020-05-14 DIAGNOSIS — E46 Unspecified protein-calorie malnutrition: Secondary | ICD-10-CM

## 2020-05-14 DIAGNOSIS — G8918 Other acute postprocedural pain: Secondary | ICD-10-CM

## 2020-05-14 DIAGNOSIS — E1165 Type 2 diabetes mellitus with hyperglycemia: Secondary | ICD-10-CM | POA: Diagnosis present

## 2020-05-14 DIAGNOSIS — M009 Pyogenic arthritis, unspecified: Secondary | ICD-10-CM | POA: Diagnosis present

## 2020-05-14 DIAGNOSIS — H9012 Conductive hearing loss, unilateral, left ear, with unrestricted hearing on the contralateral side: Secondary | ICD-10-CM | POA: Diagnosis present

## 2020-05-14 DIAGNOSIS — E861 Hypovolemia: Secondary | ICD-10-CM

## 2020-05-14 DIAGNOSIS — Z8249 Family history of ischemic heart disease and other diseases of the circulatory system: Secondary | ICD-10-CM

## 2020-05-14 DIAGNOSIS — I4891 Unspecified atrial fibrillation: Secondary | ICD-10-CM | POA: Diagnosis not present

## 2020-05-14 DIAGNOSIS — Z888 Allergy status to other drugs, medicaments and biological substances status: Secondary | ICD-10-CM

## 2020-05-14 DIAGNOSIS — R5381 Other malaise: Principal | ICD-10-CM | POA: Diagnosis present

## 2020-05-14 DIAGNOSIS — Z881 Allergy status to other antibiotic agents status: Secondary | ICD-10-CM

## 2020-05-14 DIAGNOSIS — B952 Enterococcus as the cause of diseases classified elsewhere: Secondary | ICD-10-CM | POA: Diagnosis present

## 2020-05-14 DIAGNOSIS — Z7901 Long term (current) use of anticoagulants: Secondary | ICD-10-CM

## 2020-05-14 DIAGNOSIS — R7309 Other abnormal glucose: Secondary | ICD-10-CM

## 2020-05-14 DIAGNOSIS — I130 Hypertensive heart and chronic kidney disease with heart failure and stage 1 through stage 4 chronic kidney disease, or unspecified chronic kidney disease: Secondary | ICD-10-CM | POA: Diagnosis present

## 2020-05-14 DIAGNOSIS — E039 Hypothyroidism, unspecified: Secondary | ICD-10-CM | POA: Diagnosis present

## 2020-05-14 DIAGNOSIS — L89302 Pressure ulcer of unspecified buttock, stage 2: Secondary | ICD-10-CM

## 2020-05-14 DIAGNOSIS — Z951 Presence of aortocoronary bypass graft: Secondary | ICD-10-CM

## 2020-05-14 DIAGNOSIS — M4640 Discitis, unspecified, site unspecified: Secondary | ICD-10-CM | POA: Diagnosis present

## 2020-05-14 DIAGNOSIS — Z79890 Hormone replacement therapy (postmenopausal): Secondary | ICD-10-CM

## 2020-05-14 DIAGNOSIS — Z794 Long term (current) use of insulin: Secondary | ICD-10-CM

## 2020-05-14 DIAGNOSIS — M00862 Arthritis due to other bacteria, left knee: Secondary | ICD-10-CM | POA: Diagnosis not present

## 2020-05-14 DIAGNOSIS — D62 Acute posthemorrhagic anemia: Secondary | ICD-10-CM | POA: Diagnosis not present

## 2020-05-14 DIAGNOSIS — E876 Hypokalemia: Secondary | ICD-10-CM | POA: Diagnosis present

## 2020-05-14 DIAGNOSIS — M4646 Discitis, unspecified, lumbar region: Secondary | ICD-10-CM | POA: Diagnosis not present

## 2020-05-14 DIAGNOSIS — Z79899 Other long term (current) drug therapy: Secondary | ICD-10-CM

## 2020-05-14 DIAGNOSIS — I480 Paroxysmal atrial fibrillation: Secondary | ICD-10-CM | POA: Diagnosis not present

## 2020-05-14 DIAGNOSIS — R609 Edema, unspecified: Secondary | ICD-10-CM

## 2020-05-14 DIAGNOSIS — E785 Hyperlipidemia, unspecified: Secondary | ICD-10-CM | POA: Diagnosis present

## 2020-05-14 HISTORY — DX: Pressure ulcer of unspecified site, unspecified stage: L89.90

## 2020-05-14 HISTORY — DX: Other malaise: R53.81

## 2020-05-14 LAB — BASIC METABOLIC PANEL
Anion gap: 10 (ref 5–15)
BUN: 17 mg/dL (ref 8–23)
CO2: 26 mmol/L (ref 22–32)
Calcium: 8.4 mg/dL — ABNORMAL LOW (ref 8.9–10.3)
Chloride: 100 mmol/L (ref 98–111)
Creatinine, Ser: 1.2 mg/dL (ref 0.61–1.24)
GFR calc Af Amer: >60 mL/min (ref >60)
GFR calc non Af Amer: >60 mL/min (ref >60)
Glucose, Bld: 126 mg/dL — ABNORMAL HIGH (ref 70–99)
Potassium: 4 mmol/L (ref 3.5–5.1)
Sodium: 136 mmol/L (ref 135–145)

## 2020-05-14 LAB — GLUCOSE, CAPILLARY
Glucose-Capillary: 115 mg/dL — ABNORMAL HIGH (ref 70–99)
Glucose-Capillary: 188 mg/dL — ABNORMAL HIGH (ref 70–99)
Glucose-Capillary: 124 mg/dL — ABNORMAL HIGH (ref 70–99)
Glucose-Capillary: 115 mg/dL — ABNORMAL HIGH (ref 70–99)

## 2020-05-14 LAB — CBC WITH DIFFERENTIAL/PLATELET
Abs Immature Granulocytes: 0.06 10*3/uL (ref 0.00–0.07)
Basophils Absolute: 0.1 10*3/uL (ref 0.0–0.1)
Basophils Relative: 1 %
Eosinophils Absolute: 0.5 10*3/uL (ref 0.0–0.5)
Eosinophils Relative: 6 %
HCT: 27.8 % — ABNORMAL LOW (ref 39.0–52.0)
Hemoglobin: 8.4 g/dL — ABNORMAL LOW (ref 13.0–17.0)
Immature Granulocytes: 1 %
Lymphocytes Relative: 13 %
Lymphs Abs: 1.1 10*3/uL (ref 0.7–4.0)
MCH: 28 pg (ref 26.0–34.0)
MCHC: 30.2 g/dL (ref 30.0–36.0)
MCV: 92.7 fL (ref 80.0–100.0)
Monocytes Absolute: 0.5 10*3/uL (ref 0.1–1.0)
Monocytes Relative: 5 %
Neutro Abs: 6.4 10*3/uL (ref 1.7–7.7)
Neutrophils Relative %: 74 %
Platelets: 417 10*3/uL — ABNORMAL HIGH (ref 150–400)
RBC: 3 MIL/uL — ABNORMAL LOW (ref 4.22–5.81)
RDW: 17.4 % — ABNORMAL HIGH (ref 11.5–15.5)
WBC: 8.6 10*3/uL (ref 4.0–10.5)
nRBC: 0 % (ref 0.0–0.2)

## 2020-05-14 LAB — BRAIN NATRIURETIC PEPTIDE: B Natriuretic Peptide: 135.1 pg/mL — ABNORMAL HIGH (ref 0.0–100.0)

## 2020-05-14 LAB — MAGNESIUM: Magnesium: 2.2 mg/dL (ref 1.7–2.4)

## 2020-05-14 MED ORDER — POLYETHYLENE GLYCOL 3350 17 G PO PACK
17.0000 g | PACK | Freq: Every day | ORAL | Status: DC
  Administered 2020-05-15 – 2020-06-04 (×21): 17 g via ORAL
  Filled 2020-05-14 (×23): qty 1

## 2020-05-14 MED ORDER — INSULIN GLARGINE 100 UNIT/ML ~~LOC~~ SOLN
10.0000 [IU] | Freq: Two times a day (BID) | SUBCUTANEOUS | Status: DC
  Administered 2020-05-14 – 2020-05-26 (×24): 10 [IU] via SUBCUTANEOUS
  Filled 2020-05-14 (×25): qty 0.1

## 2020-05-14 MED ORDER — AMIODARONE HCL 200 MG PO TABS
200.0000 mg | ORAL_TABLET | Freq: Every day | ORAL | Status: DC
  Administered 2020-05-24 – 2020-06-04 (×12): 200 mg via ORAL
  Filled 2020-05-14 (×12): qty 1

## 2020-05-14 MED ORDER — AMIODARONE HCL 200 MG PO TABS
200.0000 mg | ORAL_TABLET | Freq: Two times a day (BID) | ORAL | Status: AC
  Administered 2020-05-14 – 2020-05-23 (×18): 200 mg via ORAL
  Filled 2020-05-14 (×18): qty 1

## 2020-05-14 MED ORDER — SODIUM CHLORIDE 0.9 % IV SOLN: 2.0000 g | Freq: Two times a day (BID) | INTRAVENOUS | Status: DC

## 2020-05-14 MED ORDER — INSULIN ASPART 100 UNIT/ML ~~LOC~~ SOLN
0.0000 [IU] | Freq: Three times a day (TID) | SUBCUTANEOUS | Status: DC
  Administered 2020-05-14 – 2020-05-19 (×8): 3 [IU] via SUBCUTANEOUS
  Administered 2020-05-20: 4 [IU] via SUBCUTANEOUS
  Administered 2020-05-21: 7 [IU] via SUBCUTANEOUS
  Administered 2020-05-21: 3 [IU] via SUBCUTANEOUS
  Administered 2020-05-22: 4 [IU] via SUBCUTANEOUS
  Administered 2020-05-23 – 2020-05-29 (×10): 3 [IU] via SUBCUTANEOUS
  Administered 2020-05-30 – 2020-05-31 (×3): 4 [IU] via SUBCUTANEOUS
  Administered 2020-06-01: 3 [IU] via SUBCUTANEOUS
  Administered 2020-06-02 (×2): 4 [IU] via SUBCUTANEOUS
  Administered 2020-06-03 (×2): 3 [IU] via SUBCUTANEOUS
  Administered 2020-06-03: 4 [IU] via SUBCUTANEOUS
  Administered 2020-06-04: 3 [IU] via SUBCUTANEOUS

## 2020-05-14 MED ORDER — AMIODARONE HCL 200 MG PO TABS: 200.0000 mg | ORAL_TABLET | Freq: Every day | ORAL | Status: DC

## 2020-05-14 MED ORDER — FERROUS SULFATE 325 (65 FE) MG PO TABS
325.0000 mg | ORAL_TABLET | Freq: Two times a day (BID) | ORAL | Status: DC
  Administered 2020-05-14 – 2020-06-04 (×42): 325 mg via ORAL
  Filled 2020-05-14 (×42): qty 1

## 2020-05-14 MED ORDER — SODIUM CHLORIDE 0.9 % IV SOLN
2.0000 g | Freq: Two times a day (BID) | INTRAVENOUS | Status: AC
  Administered 2020-05-14 – 2020-05-20 (×13): 2 g via INTRAVENOUS
  Filled 2020-05-14: qty 0.03
  Filled 2020-05-14 (×3): qty 2
  Filled 2020-05-14: qty 0.03
  Filled 2020-05-14: qty 20
  Filled 2020-05-14 (×7): qty 2

## 2020-05-14 MED ORDER — AMIODARONE HCL 200 MG PO TABS: 200.0000 mg | ORAL_TABLET | Freq: Two times a day (BID) | ORAL | Status: DC

## 2020-05-14 MED ORDER — ENSURE ENLIVE PO LIQD
237.0000 mL | Freq: Three times a day (TID) | ORAL | Status: DC
  Administered 2020-05-14 – 2020-06-04 (×57): 237 mL via ORAL
  Filled 2020-05-14 (×11): qty 237

## 2020-05-14 MED ORDER — APIXABAN 5 MG PO TABS
5.0000 mg | ORAL_TABLET | Freq: Two times a day (BID) | ORAL | Status: DC
  Administered 2020-05-15 – 2020-06-04 (×41): 5 mg via ORAL
  Filled 2020-05-14 (×41): qty 1

## 2020-05-14 MED ORDER — SENNA 8.6 MG PO TABS
1.0000 | ORAL_TABLET | Freq: Every day | ORAL | Status: DC
  Administered 2020-05-14 – 2020-06-03 (×21): 8.6 mg via ORAL
  Filled 2020-05-14 (×21): qty 1

## 2020-05-14 MED ORDER — OXYCODONE HCL 5 MG PO TABS
5.0000 mg | ORAL_TABLET | ORAL | Status: DC | PRN
  Administered 2020-05-14 – 2020-06-02 (×32): 5 mg via ORAL
  Filled 2020-05-14 (×36): qty 1

## 2020-05-14 MED ORDER — FE FUMARATE-B12-VIT C-FA-IFC PO CAPS
1.0000 | ORAL_CAPSULE | Freq: Every day | ORAL | Status: DC
  Administered 2020-05-15 – 2020-06-04 (×21): 1 via ORAL
  Filled 2020-05-14 (×21): qty 1

## 2020-05-14 MED ORDER — ACETAMINOPHEN 650 MG RE SUPP: 650.0000 mg | Freq: Four times a day (QID) | RECTAL | Status: DC | PRN

## 2020-05-14 MED ORDER — LEVOTHYROXINE SODIUM 25 MCG PO TABS
25.0000 ug | ORAL_TABLET | Freq: Every day | ORAL | Status: DC
  Administered 2020-05-15 – 2020-06-04 (×21): 25 ug via ORAL
  Filled 2020-05-14 (×21): qty 1

## 2020-05-14 MED ORDER — SORBITOL 70 % SOLN: 30.0000 mL | Freq: Every day | Status: DC | PRN

## 2020-05-14 MED ORDER — INSULIN ASPART 100 UNIT/ML ~~LOC~~ SOLN: SUBCUTANEOUS | Status: DC

## 2020-05-14 MED ORDER — CYCLOBENZAPRINE HCL 5 MG PO TABS
5.0000 mg | ORAL_TABLET | Freq: Three times a day (TID) | ORAL | Status: DC | PRN
  Administered 2020-05-15 – 2020-05-21 (×2): 5 mg via ORAL
  Filled 2020-05-14 (×2): qty 1

## 2020-05-14 MED ORDER — ACETAMINOPHEN 325 MG PO TABS
650.0000 mg | ORAL_TABLET | Freq: Four times a day (QID) | ORAL | Status: DC | PRN
  Administered 2020-05-15 – 2020-05-27 (×6): 650 mg via ORAL
  Filled 2020-05-14 (×6): qty 2

## 2020-05-14 MED ORDER — ALBUTEROL SULFATE (2.5 MG/3ML) 0.083% IN NEBU: 2.5000 mg | INHALATION_SOLUTION | Freq: Four times a day (QID) | RESPIRATORY_TRACT | Status: DC | PRN

## 2020-05-14 MED ORDER — OXYCODONE HCL ER 10 MG PO T12A
10.0000 mg | EXTENDED_RELEASE_TABLET | Freq: Two times a day (BID) | ORAL | Status: DC
  Administered 2020-05-14 – 2020-06-03 (×40): 10 mg via ORAL
  Filled 2020-05-14 (×40): qty 1

## 2020-05-14 MED ORDER — SODIUM CHLORIDE 0.9 % IV SOLN
2.0000 g | INTRAVENOUS | Status: AC
  Administered 2020-05-14 – 2020-05-21 (×38): 2 g via INTRAVENOUS
  Filled 2020-05-14 (×2): qty 2000
  Filled 2020-05-14: qty 2
  Filled 2020-05-14: qty 2000
  Filled 2020-05-14 (×3): qty 2
  Filled 2020-05-14: qty 2000
  Filled 2020-05-14 (×4): qty 2
  Filled 2020-05-14: qty 2000
  Filled 2020-05-14 (×3): qty 2
  Filled 2020-05-14 (×2): qty 2000
  Filled 2020-05-14: qty 2
  Filled 2020-05-14 (×2): qty 2000
  Filled 2020-05-14 (×4): qty 2
  Filled 2020-05-14: qty 2000
  Filled 2020-05-14 (×4): qty 2
  Filled 2020-05-14: qty 2000
  Filled 2020-05-14: qty 2
  Filled 2020-05-14: qty 2000
  Filled 2020-05-14 (×3): qty 2
  Filled 2020-05-14: qty 2000
  Filled 2020-05-14 (×3): qty 2

## 2020-05-14 MED ORDER — AMOXICILLIN 250 MG PO CAPS
500.0000 mg | ORAL_CAPSULE | Freq: Two times a day (BID) | ORAL | Status: DC
  Administered 2020-05-21 – 2020-06-04 (×29): 500 mg via ORAL
  Filled 2020-05-14 (×29): qty 2

## 2020-05-14 MED ORDER — PREGABALIN 50 MG PO CAPS
100.0000 mg | ORAL_CAPSULE | Freq: Three times a day (TID) | ORAL | Status: DC
  Administered 2020-05-14 – 2020-06-04 (×63): 100 mg via ORAL
  Filled 2020-05-14 (×63): qty 2

## 2020-05-14 NOTE — Progress Notes (Signed)
Patient arrived to unit A/O x4 via bed, accompanied by wife. Patient denies any pains upon arrival. Patient was oriented to room and unit. Plan of care, patient safety prevention plan, and visitors policy was reviewed with patient. No concerns to report at this time. Amanda Cockayne, LPN

## 2020-05-14 NOTE — Progress Notes (Signed)
Inpatient Rehab Admissions Coordinator:   I have a bed available for pt to admit to CIR today. Dr. Candiss Norse in agreement.  I will let pt/family and TOC team know.   Shann Medal, PT, DPT Admissions Coordinator [phone] 05/14/20  11:09 AM

## 2020-05-14 NOTE — Discharge Summary (Signed)
Johnathan Murray AXK:[id number] DOB: 09/20/1951 DOA: 05/02/2020  PCP: Nicoletta Dress, MD  Admit date: 05/02/2020  Discharge date: 05/14/2020  Admitted From: CIR  Disposition:  CIR   Recommendations for Outpatient Follow-up:   Follow up with PCP in 1-2 weeks  PCP Please obtain BMP/CBC, 2 view CXR in 1week,  (see Discharge instructions)   PCP Please follow up on the following pending results: Consult pharmacy to monitor his antibiotics, reconsult ID in a week.  Reconsult Dr. Sharol Given in a week.   Home Health: None Equipment/Devices: None  Consultations: ID, Ortho, CVTS, cardiology Discharge Condition: Stable  CODE STATUS: Full    Diet Recommendation: Heart Healthy Low Carb  Diet Order            Diet - low sodium heart healthy           Diet Carb Modified Fluid consistency: Thin; Room service appropriate? Yes  Diet effective now                  CC - Fever   Brief history of present illness from the day of admission and additional interim summary    Patient is a 69 y.o. 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  Patient was seen by cardiothoracic surgery-underwent debridement of aortic root abscess/aortic valve replacement with bioprosthetic valve-ID was consulted-patient was subsequently discharged to CIR on 6/10.  While at CIR-patient started having gradually  worsening anemia, bilateral knee pain/swelling-arthrocentesis was suggestive of septic knee-patient subsequently underwent irrigation/debridement and total synovectomy of bilateral knees-postoperatively-patient was noted to have A. fib with RVR and severe anemia due to blood loss from left knee bleeding-and readmitted to the hospitalist service.  See below for further details.  Significant events: 4/29-5/18>> admit by [facility] for sepsis secondary to Enterococcus faecalis, found to have acute osteo/discitis of L/-/5, left knee septic arthritis-discharge to CIR  5/18-5/24>> at CIR-aortic root abscess/prosthetic aortic valve vegetation on TEE (done for cardioversion) 5/24-6/10>> admit by [facility] from CIR-for aortic root replacement-discharged back to CIR. 6/10-6/12>> at CIR- recurrent bilateral knee septic arthritis on arthrocentesis-ortho reconsulted 6/12-current>> admit to [facility] for A. fib with RVR, severe anemia following bilateral knee washout for septic arthritis.  Significant postoperative bleeding from left knee.  Significant studies: 4/29>> CT abdomen/pelvis: No acute intra-abdominal or pelvic pathology 4/29>> CT C-spine: Interval removal of fixating plate/screws from C4-C6, no acute osseous abnormality 4/30>>left lower extremity Doppler: No DVT, ruptured Baker's cyst 5/3>> MRI C-spine: No evidence of infection, s/p C3-4 discectomy/fusion. 5/6>> TEE: EF 60-65%, normal structure/function of aortic valve prosthesis 5/13>> MRI L-spine: Acute osteomyelitis/discitis at L4-L5, psoas muscle infection with superimposed small abscess 5/24>> TEE: Prosthetic aortic valve  endocarditis with aortic root abscess 5/25>> cardiac CT: Prosthetic valve endocarditis with leaflet thickening concerning for vegetation, aortic root abscess 6/9>> EF 55-60%, normal structure/function of the aortic valve prosthesis 6/10>> MRI right knee: Moderate to large complex joint effusion-consistent with synovitis, no evidence of  osteomyelitis 6/10>> MRI left knee: Large complex joint effusion-suspicious for synovitis.  Procedures : 6/12>> irrigation/debridement with total synovectomy of bilateral knees 6/9>> TEE guided cardioversion 5/27>>S/pRedo mediansternotomy, rightaxillaryartery graftfor cardiopulmonary bypass, extracorporeal circulation, debridement of aortic root abscess and pseudoaneurysm, and aortic root replacement using a 77mm Homograft aortic root with reimplantation of coronary arteries. 5/24>> TEE guided cardioversion.  Found to have vegetation on prosthetic aortic valve on TEE. 5/18>> fluoroscopy guided needle aspiration of L4-L5 disc space 5/4>>B/L knee irrigation irrigation debridement arthroscopic. B/L knee medial and lateral menisectomies.  Antimicrobial therapy: Ampicillin: 5/18>> Rocephin: 5/19>>  Microbiology data: 6/12>> knee culture: No growth 6/10>> left knee synovial fluid culture: No growth  5/27>> aortic valve tissue culture: No growth 5/26>> blood culture: No growth 5/22>> urine culture: No growth 5/19>> left synovitic fluid culture: No growth 5/18>> L4-L5 disc aspirate: Enterococcus faecalis 5/6>> blood culture: No growth 5/3>> blood culture: No growth 5/2>> right knee synovial fluid: No growth 5/1>> left knee synovial fluid: Enterococcus faecalis 4/30>> blood culture: Enterococcus faecalis 4/29>> urine culture: No growth                                                                 Hospital Course     Hemorrhagic shock 2/2 to post op bleeding from left knee with acute blood loss anemia superimposed on anemia of chronic disease: Shock physiology has resolved-transfused a total of 5 units of PRBC this admission.  Left knee bleeding seems to have essentially resolved. Coagulation was cautiously started on 05/06/2020 however on 05/10/2020 he likely has bled again and heparin drip has been stopped.  He got 2 more units of packed RBC transfusion on 05/10/2020.  H&H is stable  no signs of ongoing bleeding.  He was seen by Dr. Sharol Given again on 05/11/2020, plan is to continue wound care, PT OT.  If H&H remained stable commence heparin drip with caution on 05/16/2020 thereafter Eliquis in a few days if no bleeding on heparin.   A. fib with RVR with [place] vas 2 score of greater than 3: Rate controlled but remains in atrial fibrillation-continue amiodarone, low-dose beta-blocker had to be discontinued on 05/12/2020 due to extremely soft blood pressures.  Cardiology following, anticoagulation as above.  Patient and family understand the risks and benefits of holding anticoagulation or using it. Note please have pharmacy titrate down his amiodarone dose.  Enterococcus bacteremia-with L4-L5 discitis, bilateral septic knee, prosthetic valve vegetation with aortic root abscess: Evaluated by infectious disease-recommendations are to continue ceftriaxone/ampicillin till 05/20/2020-and then to start p.o. amoxicillin 500 mg twice daily from 05/21/2020.  Appointment made with Dr. Megan Salon on 06/03/2020 at 2:45 PM.    Involve ID and pharmacy for antibiotic monitoring.  CAD-s/p CABG-no anginal symptoms-continue supportive care  Hypothyroidism: Continue levothyroxine  Debility/deconditioning: Secondary to acute illness-we will need PT/OT-plans are to discharge back to CIR.  DM-2 with uncontrolled hyperglycemia:  Continue on Lantus and sliding scale.  Continue to monitor and adjust.  Lab Results  Component Value Date   HGBA1C  6.8 (H) 02/24/2020   CBG (last 3)  Recent Labs    05/13/20 1614 05/13/20 2228 05/14/20 0754  GLUCAP 162* 146* 115*     Discharge diagnosis     Principal Problem:   Bacteremia due to Enterococcus Active Problems:   Diabetes mellitus due to underlying condition with unspecified complications (HCC)   Dyslipidemia   PAF (paroxysmal atrial fibrillation) (HCC)   S/P AVR   Septic arthritis of knee, bilateral (HCC)   Prosthetic valve endocarditis (HCC)   Acute  blood loss anemia   Prolonged QT interval   Transient hypotension   Lumbar discitis    Discharge instructions    Discharge Instructions    Diet - low sodium heart healthy   Complete by: As directed    Low Carb   Discharge wound care:   Complete by: As directed    Bilateral knees 4 x 4 ABDs if needed with Ace wraps.  Change dressing once a day.   Increase activity slowly   Complete by: As directed       Discharge Medications   Allergies as of 05/14/2020      Reactions   Atorvastatin Other (See Comments)   Urinary retention    Nebivolol Diarrhea      Medication List    STOP taking these medications   acetaminophen 325 MG tablet Commonly known as: TYLENOL   amLODipine 10 MG tablet Commonly known as: NORVASC   ampicillin 2 g in sodium chloride 0.9 % 100 mL   apixaban 5 MG Tabs tablet Commonly known as: Eliquis   cyclobenzaprine 5 MG tablet Commonly known as: FLEXERIL   docusate sodium 100 MG capsule Commonly known as: COLACE   ferrous [id number] C-folic acid capsule Commonly known as: TRINSICON / FOLTRIN   glimepiride 2 MG tablet Commonly known as: AMARYL     TAKE these medications   amiodarone 200 MG tablet Commonly known as: PACERONE Take 1 tablet (200 mg total) by mouth 2 (two) times daily. What changed:   medication strength  how much to take   amiodarone 200 MG tablet Commonly known as: PACERONE Take 1 tablet (200 mg total) by mouth daily. Start taking on: May 24, 2020 What changed: You were already taking a medication with the same name, and this prescription was added. Make sure you understand how and when to take each.   amoxicillin 500 MG capsule Commonly known as: AMOXIL Take 1 capsule (500 mg total) by mouth 2 (two) times daily. Start taking on: May 21, 2020   aspirin 81 MG EC tablet Take 1 tablet (81 mg total) by mouth daily.   benazepril 40 MG tablet Commonly known as: LOTENSIN Take 40 mg by mouth at bedtime.     cefTRIAXone 2 g in sodium chloride 0.9 % 100 mL Inject 2 g into the vein every 12 (twelve) hours.   Co Q10 200 MG Caps Take 200 mg by mouth daily.   ezetimibe 10 MG tablet Commonly known as: ZETIA Take 10 mg by mouth at bedtime.   ferrous sulfate 325 (65 FE) MG tablet Take 325 mg by mouth daily with breakfast.   furosemide 40 MG tablet Commonly known as: LASIX Take 40 mg by mouth at bedtime.   insulin aspart 100 UNIT/ML injection Commonly known as: NovoLOG Substitute to any brand approved.Before each meal 3 times a day, 140-199 - 2 units, 200-250 - 4 units, 251-299 - 6 units,  300-349 - 8 units,  350 or above 10 units. Dispense  syringes and needles as needed, Ok to switch to PEN if approved. DX DM2, Code E11.65 What changed:   how much to take  how to take this  when to take this  additional instructions  Another medication with the same name was removed. Continue taking this medication, and follow the directions you see here.   insulin glargine 100 UNIT/ML injection Commonly known as: LANTUS Inject 0.1 mLs (10 Units total) into the skin 2 (two) times daily.   levothyroxine 25 MCG tablet Commonly known as: SYNTHROID Take 25 mcg by mouth daily before breakfast.   metoprolol tartrate 25 MG tablet Commonly known as: LOPRESSOR Take 1 tablet (25 mg total) by mouth 2 (two) times daily.   oxyCODONE 10 mg 12 hr tablet Commonly known as: OXYCONTIN Take 1 tablet (10 mg total) by mouth every 12 (twelve) hours.   pregabalin 100 MG capsule Commonly known as: LYRICA Take 1 capsule (100 mg total) by mouth 3 (three) times daily.   senna 8.6 MG Tabs tablet Commonly known as: SENOKOT Take 1 tablet (8.6 mg total) by mouth at bedtime.   vitamin B-12 1000 MCG tablet Commonly known as: CYANOCOBALAMIN Take 1,000 mcg by mouth daily.   vitamin C 500 MG tablet Commonly known as: ASCORBIC ACID Take 500 mg by mouth daily.   zinc gluconate 50 MG tablet Take 50 mg by mouth  daily.            Discharge Care Instructions  (From admission, onward)         Start     Ordered   05/14/20 0000  Discharge wound care:       Comments: Bilateral knees 4 x 4 ABDs if needed with Ace wraps.  Change dressing once a day.   05/14/20 1036           Follow-up Information    Revankar, Reita Cliche, MD Follow up on 05/27/2020.   Specialty: Cardiology Why: Please arrive 15 minutes early for your 4pm post-hospital cardiology follow-up appointment.  Contact information: [address] [place] [address] [phone]               Major procedures and Radiology Reports - PLEASE review detailed and final reports thoroughly  -         DG Chest 2 View  Result Date: 04/26/2020 CLINICAL DATA:  Endocarditis. EXAM: CHEST - 2 VIEW COMPARISON:  04/18/2020 FINDINGS: Patient has RIGHT-sided PICC line, tip overlying the superior vena cava. Status post median sternotomy. The heart is enlarged and stable in configuration. Stable prominence of the RIGHT first costochondral junction. No focal consolidations or pulmonary edema. Small bilateral pleural effusions are present. IMPRESSION: Stable cardiomegaly. Small effusions. Electronically Signed   By: Nolon Nations M.D.   On: 04/26/2020 08:22   DG Knee 1-2 Views Right  Result Date: 05/09/2020 CLINICAL DATA:  Swelling and pain. Recurrent bilateral septic arthritis. EXAM: RIGHT KNEE - 1-2 VIEW COMPARISON:  None. FINDINGS: Anterior skin staples are identified. Suggested bony erosion is seen along the lateral tibial plateau, apparently present on the MRI from April 30, 2020 but not appreciated on the x-ray from Mar 21, 2020. A similar but smaller region of possible erosion is seen along the medial tibial plateau, unchanged since the Mar 21, 2020 x-ray and the comparison MRI. Possible mild erosion along the lateral femoral epicondyle, possibly on the recent MRI as well. No other sites of erosion are identified. No fractures. A  large suprapatellar joint effusion is identified,  containing foci of air. Anterior soft tissue swelling is seen, associated with the skin staples. No other abnormalities. IMPRESSION: 1. Apparent bony erosion along the medial tibial plateau, not seen on the Mar 21, 2020 radiograph but apparently present on the April 30, 2020 MRI. Possible subtle erosion along the distal lateral femoral epicondyle as well, also possibly on the recent MRI. A small region of possible erosion along the medial tibial plateau is unchanged since Mar 21, 2020. These findings suggest osteomyelitis and are consistent with the recent known diagnosis of bilateral septic knees. The patient has undergone recent surgery for septic knees. Recommend clinical correlation and attention on follow-up. 2. Large joint effusion containing foci of gas. These findings may simply be due to recent surgery. It would be difficult to exclude a superimposed infection on today's imaging. Electronically Signed   By: Dorise Bullion III M.D   On: 05/09/2020 12:15   MR KNEE RIGHT WO CONTRAST  Result Date: 05/01/2020 CLINICAL DATA:  Bilateral knee pain and swelling. Previous bilateral knee irrigation and debridement on 03/22/2020. Bilateral knee aspiration today. Concern for septic arthritis. EXAM: MRI OF THE RIGHT KNEE WITHOUT CONTRAST TECHNIQUE: Multiplanar, multisequence MR imaging of the knee was performed. No intravenous contrast was administered. COMPARISON:  None. FINDINGS: MENISCI Medial meniscus:  Intact with normal morphology. Lateral meniscus: Mild degeneration of the anterior horn. No evidence of meniscal tear. LIGAMENTS Cruciates: Severe ACL mucoid degeneration without evidence of acute ligamentous injury. The PCL is intact. Collaterals:  Intact. CARTILAGE Patellofemoral: Severe patellofemoral degenerative changes with diffuse chondral thinning and osteophyte formation. Medial: Mild chondral thinning with peripheral osteophytes and subchondral edema.  Lateral: Moderate chondral thinning with surface irregularity, osteophytes and peripheral subchondral edema in the lateral femoral condyle. There is a small peripheral osteochondral lesion which appears chronic. No evidence of erosive disease. MISCELLANEOUS Joint: Moderate to large complex joint effusion with heterogeneous signal, most consistent with synovitis. Popliteal Fossa: There is complex fluid within the popliteus hiatus. No significant Baker's cyst. Extensor Mechanism:  Intact. Bones: No evidence of acute fracture or dislocation. Scattered arthropathic changes as described without bone destruction or generalized periarticular edema to suggest septic arthritis. Other: Subcutaneous edema posterolaterally. No focal fluid collection. IMPRESSION: 1. Moderate to large complex joint effusion with heterogeneous signal, most consistent with synovitis. Associated complex fluid in the popliteus hiatus. 2. No specific evidence of osteomyelitis. 3. Tricompartmental degenerative changes, severe at the patellofemoral compartment. No acute osseous findings. 4. Severe ACL mucoid degeneration without evidence of acute ligamentous injury. Electronically Signed   By: Richardean Sale M.D.   On: 05/01/2020 08:15   MR KNEE LEFT WO CONTRAST  Result Date: 05/01/2020 CLINICAL DATA:  Bilateral knee pain and swelling. Previous bilateral knee irrigation and debridement on 03/22/2020. Bilateral knee aspiration today. Concern for septic arthritis. EXAM: MRI OF THE LEFT KNEE WITHOUT CONTRAST TECHNIQUE: Multiplanar, multisequence MR imaging of the knee was performed. No intravenous contrast was administered. COMPARISON:  Radiographs 03/21/2020 FINDINGS: MENISCI Medial meniscus: Irregular degenerative tear of the posterior horn, involving the meniscal root. There is resulting peripheral extrusion of the meniscus from the joint space. No centrally displaced meniscal fragment identified. Lateral meniscus: There is a radial tear of the  posterior horn, best seen on the coronal images. No centrally displaced meniscal fragment. The meniscus is partially extruded peripherally from the joint. LIGAMENTS Cruciates: Poorly defined anterior cruciate ligament, likely due to advanced ACL mucoid degeneration. Integrity of the ligament is difficult to confirm. There is also probable mucoid degeneration of  the PCL. No acute ligamentous findings. Collaterals: Intact. Moderate fluid in the medial collateral ligament bursa. CARTILAGE Patellofemoral: Advanced patellofemoral arthropathy with diffuse joint space narrowing and osteophytes. Medial: Diffuse chondral thinning, surface irregularity and osteophyte formation. Scattered nonspecific edema and subchondral cyst formation without focal erosions or bone destruction. Lateral: Diffuse chondral thinning, surface irregularity and osteophyte formation. Scattered nonspecific subchondral cyst formation and edema in the lateral femoral condyle. No bone destruction. MISCELLANEOUS Joint: Large complex joint effusion with diffuse synovial irregularity suspicious for synovitis. Popliteal Fossa: There is complex fluid in the popliteus hiatus. There is a small Baker's cyst. Extensor Mechanism:  Intact. Bones: Nonspecific marrow changes as described above, consistent with underlying arthropathy/synovitis. No bone destruction or generalized periarticular edema to suggest septic arthritis or osteomyelitis. Other: Mild soft tissue edema posterolaterally without other focal fluid collection. IMPRESSION: 1. Large complex joint effusion with diffuse synovial irregularity suspicious for synovitis, nonspecific in etiology. Based on prior radiographs and aspiration results, findings could be secondary to CPPD arthropathy. 2. No specific evidence of septic arthritis or osteomyelitis. 3. Complex degenerative tear of the posterior horn of the medial meniscus, involving the meniscal root. Radial tear of the posterior horn of the lateral  meniscus. 4. Advanced ACL mucoid degeneration and probable mucoid degeneration of the PCL. 5. Moderate medial collateral ligament bursitis. Electronically Signed   By: Richardean Sale M.D.   On: 05/01/2020 08:24   CT CORONARY MORPH W/CTA COR W/SCORE W/CA W/CM &/OR WO/CM  Addendum Date: 04/14/2020   ADDENDUM REPORT: 04/14/2020 20:07 CLINICAL DATA:  Endocarditis/Aortic root abscess/Prosthetic aortic valve. EXAM: Cardiac TAVR CT TECHNIQUE: The patient was scanned on a [employer]. A 120 kV retrospective scan was triggered in the descending thoracic aorta at 111 HU's. Gantry rotation speed was 250 msecs and collimation was .6 mm. No beta blockade or nitro were given. The 3D data set was reconstructed in 5% intervals of the R-R cycle. Systolic and diastolic phases were analyzed on a dedicated work station using MPR, MIP and VRT modes. The patient received 80 cc of contrast. FINDINGS: Image quality: Average. Noise artifact is: There is cardiac motion (PVC) and respiratory artifact. Aortic Valve: There is a 23 mm Magna ease bioprosthetic valve present in the aortic position. The leaflet of the LCC appears thickened with likely vegetation. There is an aortic root abscess with a large pseudoaneurysm that measures 30 mm x 10 mm located off the [place] of the aortic root with extension anteriorly in front of the RCC. There is a fistulous connection between the pseudoaneurysm and the LVOT anterior to the RCC of the aortic root. Coronary Arteries: Normal coronary origin. Right dominance. CAC score not performed due to prior CABG. Please refer to recent cardiac cath for definitive assessment of coronary arteries and bypass grafts. Bypass Grafts: A patent LIMA to mid LAD is seen. No other vein grafts observed. At least one is occluded, likely 2, on the anterior aspect of the aorta. Left main: The left main is a large caliber vessel with a normal take off from the left coronary cusp that bifurcates to form a left  anterior descending artery and a left circumflex artery. There is minimal calcified plaque (<25%). Left anterior descending artery: The LAD is 100% occluded proximally. Left circumflex artery: The LCX is non-dominant. The proximal LCX contains mild calcified plaque (25-49%). Distal evaluation is limited by artifact. Right coronary artery: The RCA is dominant with normal take off from the right coronary cusp. There is mild to moderate heavily  calcified plaque throughout. Vessel severity is unable to be determined. The RCA terminates as PDA and PLV branches with heavily calcified plaque. Cardiac Morphology: Right Atrium: Right atrial size is within normal limits. Right Ventricle: The right ventricular cavity is within normal limits. Left Atrium: Left atrial size is normal in size with no left atrial appendage filling defect. Left Ventricle: The ventricular cavity size is within normal limits. Pulmonary arteries: Normal in size without proximal filling defect. Pulmonary veins: Normal pulmonary venous drainage. Pericardium: Normal thickness with no significant effusion or calcium present. Mitral Valve: The mitral valve is normal structure without significant calcification. Extra-cardiac findings: See attached radiology report for non-cardiac structures. IMPRESSION: 1. Prosthetic valve (23 mm Magna ease) endocarditis with leaflet thickening concerning for vegetation. 2. 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 3. Fistulous connection between the pseudoaneurysm and LVOT described above. 3. 3-vessel CAD with patent LIMA-LAD. [place] T. Audie Box, MD Electronically Signed   By: Eleonore Chiquito   On: 04/14/2020 20:07   Result Date: 04/14/2020 EXAM: OVER-READ INTERPRETATION  CT CHEST The following report is an over-read performed by radiologist Dr. Vinnie Langton of [facility], [place] on 04/14/2020. This over-read does not include interpretation of cardiac or  coronary anatomy or pathology. The coronary calcium score/coronary CTA interpretation by the cardiologist is attached. COMPARISON:  None. FINDINGS: Within the visualized portions of the thorax there are no suspicious appearing pulmonary nodules or masses, there is no acute consolidative airspace disease, no pleural effusions, no pneumothorax and no lymphadenopathy. Visualized portions of the upper abdomen are unremarkable. There are no aggressive appearing lytic or blastic lesions noted in the visualized portions of the skeleton. Status post median sternotomy for aortic valve replacement. IMPRESSION: No significant incidental noncardiac findings are noted. Electronically Signed: By: Vinnie Langton M.D. On: 04/14/2020 13:26   DG Chest Port 1 View  Result Date: 04/18/2020 CLINICAL DATA:  Status post aortic valve repair EXAM: PORTABLE CHEST 1 VIEW COMPARISON:  04/17/2020 FINDINGS: Swan-Ganz catheter has been removed in the interval. Right jugular sheath remains in place. Cardiac shadow is stable. Pericardial drain and mediastinal drain are again seen and stable. The lungs are well aerated without pneumothorax. No focal infiltrate is seen. No bony abnormality is noted. IMPRESSION: Tubes and lines as described above. No acute infiltrate is seen. Electronically Signed   By: Inez Catalina M.D.   On: 04/18/2020 09:19   DG Chest Port 1 View  Result Date: 04/17/2020 CLINICAL DATA:  Status post aortic valve repair EXAM: PORTABLE CHEST 1 VIEW COMPARISON:  04/16/2020 FINDINGS: Endotracheal tube and gastric catheter have been removed in the interval. Mediastinal drain and pericardial drain remain in place as does a Swan-Ganz catheter. Lungs are hypoinflated with mild basilar atelectasis. No pneumothorax is seen. No bony abnormality is noted. IMPRESSION: Tubes and lines as described above. Poor inspiratory effort with mild bibasilar atelectasis. Electronically Signed   By: Inez Catalina M.D.   On: 04/17/2020 08:46   DG  Chest Port 1 View  Result Date: 04/16/2020 CLINICAL DATA:  Status post aortic valve repair. EXAM: PORTABLE CHEST 1 VIEW COMPARISON:  Apr 16, 2020. FINDINGS: Stable cardiomegaly. Endotracheal and nasogastric tubes are in grossly good position. Right internal jugular catheter is noted with distal tip in expected position of main pulmonary artery. No pneumothorax or significant pleural effusion is noted. Minimal bibasilar subsegmental atelectasis is noted. Bony thorax is unremarkable. IMPRESSION: Minimal bibasilar subsegmental atelectasis. Endotracheal and nasogastric tubes are  in grossly good position. No pneumothorax is noted. Electronically Signed   By: Marijo Conception M.D.   On: 04/16/2020 19:58   DG CHEST PORT 1 VIEW  Result Date: 04/16/2020 CLINICAL DATA:  Preop. EXAM: PORTABLE CHEST 1 VIEW COMPARISON:  Radiograph 03/20/2020 FINDINGS: Post median sternotomy. Prosthetic aortic valve. Mild cardiomegaly with normal mediastinal contours. No pulmonary edema, focal airspace disease, pleural effusion or pneumothorax. No acute osseous abnormalities are seen. IMPRESSION: Mild cardiomegaly without acute abnormality. Electronically Signed   By: Keith Rake M.D.   On: 04/16/2020 03:42   ECHO TEE  Result Date: 04/29/2020    TRANSESOPHOGEAL ECHO REPORT   Patient Name:   Johnathan Murray Gavin Date of Exam: 04/29/2020 Medical Rec #:  [id number]           Height:       70.0 in Accession #:    [id number]          Weight:       177.7 lb Date of Birth:  1951/03/19           BSA:          1.985 m Patient Age:    69 years            BP:           76/37 mmHg Patient Gender: M                   HR:           68 bpm. Exam Location:  Inpatient Procedure: Transesophageal Echo, Cardiac Doppler and Color Doppler Indications:     Atrial fibrillation 427.31 / I48.91  History:         Patient has prior history of Echocardiogram examinations, most                  recent 04/16/2020. CAD, Aortic Valve Disease, Arrythmias:Atrial                   Fibrillation and Atrial Flutter, Signs/Symptoms:Bacteremia;                  Risk Factors:Hypertension, Dyslipidemia and Non-Smoker.                  Aortic Valve: 23 mm Homograft valve/aortic root valve is                  present in the aortic position. Procedure Date: 04/16/2020.  Sonographer:     Vickie Epley RDCS Referring Phys:  [id number] Gaye Pollack Diagnosing Phys: Skeet Latch MD PROCEDURE: After discussion of the risks and benefits of a TEE, an informed consent was obtained from the patient. The transesophogeal probe was passed without difficulty through the esophogus of the patient. Sedation performed by different physician. The patient was monitored while under deep sedation. Anesthestetic sedation was provided intravenously by Anesthesiology: 317.97mg  of Propofol. Image quality was excellent. The patient's vital signs; including heart rate, blood pressure, and oxygen saturation; remained stable throughout the procedure. The patient developed no complications during the procedure. A successful direct current cardioversion was performed at 200 joules with 1 attempt. IMPRESSIONS  1. Left ventricular ejection fraction, by estimation, is 55 to 60%. The left ventricle has normal function. The left ventricle has no regional wall motion abnormalities.  2. Right ventricular systolic function is mildly reduced. The right ventricular size is normal.  3. No left atrial/left atrial appendage thrombus was detected. The LAA emptying velocity was 50  cm/s.  4. Multiple regurgitant jets each with mild regurgitation. The mitral valve is normal in structure. Mild to moderate mitral valve regurgitation. No evidence of mitral stenosis.  5. Tricuspid valve regurgitation is mild to moderate.  6. The aortic valve has been repaired/replaced. Aortic valve regurgitation is trivial. No aortic stenosis is present. There is a 23 mm Homograft valve/aortic root valve present in the aortic position. Procedure Date: 04/16/2020.  Echo findings are consistent with normal structure and function of the aortic valve prosthesis.  7. The inferior vena cava is normal in size with greater than 50% respiratory variability, suggesting right atrial pressure of 3 mmHg. Conclusion(s)/Recommendation(s): Normal biventricular function without evidence of hemodynamically significant valvular heart disease. FINDINGS  Left Ventricle: Left ventricular ejection fraction, by estimation, is 55 to 60%. The left ventricle has normal function. The left ventricle has no regional wall motion abnormalities. The left ventricular internal cavity size was normal in size. There is  no left ventricular hypertrophy. Right Ventricle: The right ventricular size is normal. No increase in right ventricular wall thickness. Right ventricular systolic function is mildly reduced. Left Atrium: Left atrial size was normal in size. No left atrial/left atrial appendage thrombus was detected. The LAA emptying velocity was 50 cm/s. Right Atrium: Right atrial size was normal in size. Pericardium: There is no evidence of pericardial effusion. Mitral Valve: Multiple regurgitant jets each with mild regurgitation. The mitral valve is normal in structure. Normal mobility of the mitral valve leaflets. Mild to moderate mitral valve regurgitation. No evidence of mitral valve stenosis. Tricuspid Valve: The tricuspid valve is normal in structure. Tricuspid valve regurgitation is mild to moderate. No evidence of tricuspid stenosis. Aortic Valve: No perivalvular regurgitation or abscess. The aortic valve has been repaired/replaced. Aortic valve regurgitation is trivial. No aortic stenosis is present. There is a 23 mm Homograft valve/aortic root valve present in the aortic position. Procedure Date: 04/16/2020. Echo findings are consistent with normal structure and function of the aortic valve prosthesis. Pulmonic Valve: The pulmonic valve was normal in structure. Pulmonic valve regurgitation is trivial.  No evidence of pulmonic stenosis. Aorta: The aortic root is normal in size and structure. There is minimal (Grade I) plaque involving the descending aorta. Venous: The inferior vena cava is normal in size with greater than 50% respiratory variability, suggesting right atrial pressure of 3 mmHg. IAS/Shunts: No atrial level shunt detected by color flow Doppler. There is no evidence of a patent foramen ovale. There is no evidence of an atrial septal defect.  MR Peak grad:    75.3 mmHg   TRICUSPID VALVE MR Mean grad:    42.0 mmHg   TR Peak grad:   18.1 mmHg MR Vmax:         434.00 cm/s TR Vmax:        213.00 cm/s MR Vmean:        296.0 cm/s MR PISA:         1.01 cm MR PISA Eff ROA: 9 mm MR PISA Radius:  0.40 cm Skeet Latch MD Electronically signed by Skeet Latch MD Signature Date/Time: 04/29/2020/1:20:20 PM    Final    VAS US DOPPLER PRE CABG  Result Date: 04/15/2020 PREOPERATIVE VASCULAR EVALUATION  Indications:      Pre-CABG. Risk Factors:     Diabetes. Comparison Study: No prior studies. Performing Technologist: Carlos Levering Rvt  Examination Guidelines: A complete evaluation includes B-mode imaging, spectral Doppler, color Doppler, and power Doppler as needed of all accessible portions of each  vessel. Bilateral testing is considered an integral part of a complete examination. Limited examinations for reoccurring indications may be performed as noted.  Right Carotid Findings: +----------+--------+--------+--------+-----------------------+--------+           PSV cm/sEDV cm/sStenosisDescribe               Comments +----------+--------+--------+--------+-----------------------+--------+ CCA Prox  87      17              smooth and heterogenous         +----------+--------+--------+--------+-----------------------+--------+ CCA Distal72      20              smooth and heterogenous         +----------+--------+--------+--------+-----------------------+--------+ ICA Prox  60      14               calcific                        +----------+--------+--------+--------+-----------------------+--------+ ICA Distal75      28                                              +----------+--------+--------+--------+-----------------------+--------+ ECA       89      14                                              +----------+--------+--------+--------+-----------------------+--------+ Portions of this table do not appear on this page. +----------+--------+-------+--------+------------+           PSV cm/sEDV cmsDescribeArm Pressure +----------+--------+-------+--------+------------+ [id number]                                  +----------+--------+-------+--------+------------+ +---------+--------+--+--------+-+---------+ VertebralPSV cm/s29EDV cm/s7Antegrade +---------+--------+--+--------+-+---------+ Left Carotid Findings: +----------+--------+--------+--------+-----------------------+--------+           PSV cm/sEDV cm/sStenosisDescribe               Comments +----------+--------+--------+--------+-----------------------+--------+ CCA Prox  89      15              smooth and heterogenous         +----------+--------+--------+--------+-----------------------+--------+ CCA Distal80      27              calcific                        +----------+--------+--------+--------+-----------------------+--------+ ICA Prox  71      24              calcific                        +----------+--------+--------+--------+-----------------------+--------+ ICA Distal85      34                                              +----------+--------+--------+--------+-----------------------+--------+ ECA       78      9                                               +----------+--------+--------+--------+-----------------------+--------+ +----------+--------+--------+--------+------------+  SubclavianPSV cm/sEDV cm/sDescribeArm Pressure  +----------+--------+--------+--------+------------+           94                                   +----------+--------+--------+--------+------------+ +---------+--------+--+--------+--+---------+ VertebralPSV cm/s52EDV cm/s23Antegrade +---------+--------+--+--------+--+---------+  ABI Findings: +--------+------------------+-----+---------+--------------+ Right   Rt Pressure (mmHg)IndexWaveform Comment        +--------+------------------+-----+---------+--------------+ Brachial                                Restricted arm +--------+------------------+-----+---------+--------------+ PTA     163               1.24 triphasic               +--------+------------------+-----+---------+--------------+ DP      147               1.12 triphasic               +--------+------------------+-----+---------+--------------+ +--------+------------------+-----+---------+-------+ Left    Lt Pressure (mmHg)IndexWaveform Comment +--------+------------------+-----+---------+-------+ [id number]                    triphasic        +--------+------------------+-----+---------+-------+ PTA     156               1.19 triphasic        +--------+------------------+-----+---------+-------+ DP      138               1.05 triphasic        +--------+------------------+-----+---------+-------+ +-------+---------------+----------------+ ABI/TBIToday's ABI/TBIPrevious ABI/TBI +-------+---------------+----------------+ Right  1.24                            +-------+---------------+----------------+ Left   1.19                            +-------+---------------+----------------+  Right Doppler Findings: +--------+--------+-----+-------+--------------+ Site    PressureIndexDopplerComments       +--------+--------+-----+-------+--------------+ Brachial                    Restricted arm +--------+--------+-----+-------+--------------+  Left Doppler Findings:  +--------+--------+-----+---------+--------+ Site    PressureIndexDoppler  Comments +--------+--------+-----+---------+--------+ [id number]          triphasic         +--------+--------+-----+---------+--------+ Radial               triphasic         +--------+--------+-----+---------+--------+ Ulnar                triphasic         +--------+--------+-----+---------+--------+  Summary: Right Carotid: Velocities in the right ICA are consistent with a 1-39% stenosis. Left Carotid: Velocities in the left ICA are consistent with a 1-39% stenosis. Vertebrals: Bilateral vertebral arteries demonstrate antegrade flow. Right ABI: Resting right ankle-brachial index is within normal range. No evidence of significant right lower extremity arterial disease. Left ABI: Resting left ankle-brachial index is within normal range. No evidence of significant left lower extremity arterial disease. Left Upper Extremity: Doppler waveform obliterate with left radial compression. Doppler waveforms decrease >50% with left ulnar compression.  Electronically signed by Curt Jews MD on 04/15/2020 at 7:14:30 PM.    Final    [place] EKG SITE RITE  Result Date: 04/21/2020 If [facility]  image not attached, placement could not be confirmed due to current cardiac rhythm.   Micro Results    No results found for this or any previous visit (from the past 240 hour(s)).  Today   Subjective    Johnathan Murray today has no headache,no chest abdominal pain,no new weakness tingling or numbness, feels much better      Objective   Blood pressure 102/88, pulse 96, temperature (!) 97.5 F (36.4 C), temperature source Oral, resp. rate 16, height 5\' 10"  (1.778 m), weight 90 kg, SpO2 100 %.   Intake/Output Summary (Last 24 hours) at 05/14/2020 1038 Last data filed at 05/14/2020 0550 Gross per 24 hour  Intake 640 ml  Output 2100 ml  Net -1460 ml    Exam  Awake Alert, No new F.N deficits, Normal  affect [place].AT,PERRAL Supple Neck,No JVD, No cervical lymphadenopathy appriciated.  Symmetrical Chest wall movement, Good air movement bilaterally, CTAB RRR,No Gallops,Rubs or new Murmurs, No Parasternal Heave +ve B.Sounds, Abd Soft, Non tender, No organomegaly appriciated, No rebound -guarding or rigidity. bilateral moderate knee swelling, minimal bleeding around the left knee incision site, both incision sites appear stable no signs of hematoma on exam i.e. no discoloration   Data Review   CBC w Diff:  Lab Results  Component Value Date   WBC 8.6 05/14/2020   HGB 8.4 (L) 05/14/2020   HGB 13.2 01/17/2020   HCT 27.8 (L) 05/14/2020   HCT 37.6 01/17/2020   PLT 417 (H) 05/14/2020   PLT 221 01/17/2020   LYMPHOPCT 13 05/14/2020   MONOPCT 5 05/14/2020   EOSPCT 6 05/14/2020   BASOPCT 1 05/14/2020    CMP:  Lab Results  Component Value Date   NA 136 05/14/2020   NA 139 02/04/2020   K 4.0 05/14/2020   CL 100 05/14/2020   CO2 26 05/14/2020   BUN 17 05/14/2020   BUN 22 02/04/2020   CREATININE 1.20 05/14/2020   PROT 5.0 (L) 05/13/2020   PROT 7.1 02/04/2020   ALBUMIN 1.8 (L) 05/13/2020   ALBUMIN 4.8 02/04/2020   BILITOT 0.8 05/13/2020   BILITOT 1.2 02/04/2020   ALKPHOS 90 05/13/2020   AST 46 (H) 05/13/2020   ALT 59 (H) 05/13/2020  .   Total Time in preparing paper work, data evaluation and todays exam - 70 minutes  Lala Lund M.D on 05/14/2020 at 10:38 AM  [facility]   Office  [phone]

## 2020-05-14 NOTE — Progress Notes (Signed)
PMR Admission Coordinator Pre-Admission Assessment   Patient: Johnathan Murray is an 69 y.o., male MRN: [id number] DOB: [birth year] Height: '5\' 10"'$  (177.8 cm) Weight: 90.6 kg   Insurance Information HMO:     PPO: yes     PCP:      IPA:      80/20:      OTHER:  PRIMARY: [employer] [place]      Policy#: [id number]      Subscriber: pt CM Name: Seth Bake      Phone#: [phone]     Fax#: [phone] Pre-Cert#: [id number] auth provided via fax, with updates due on 6/28 to fax listed above      Employer:  Benefits:  Phone #: [phone]     Name:  Eff. Date: 11/21/2013     Deduct: $600 (met)      Out of Pocket Max: $1800 (met)      Life Max: n/a CIR: 80%      SNF: 80% Outpatient: 80%     Co-Pay: 20% Home Health: 100%      Co-Pay:  DME: 80%     Co-Pay: 20% Providers:  SECONDARY: Medicare A      Policy#: [id number]     Phone#:    Development worker, community:       Phone#:    The Engineer, petroleum" for patients in Inpatient Rehabilitation Facilities with attached "Privacy Act [place] Records" was provided and verbally reviewed with: Patient and Family   Emergency Contact Information         Contact Information     Name Relation Home Work Mobile    Lebron,[name] Spouse [phone]   [phone]         Current Medical History  Patient Admitting Diagnosis: debility following multiple medical issues/procedures (discitis, endocarditis s/p aortic root repair, bilateral septic arthritis in knees s/p bilat synovectomies)   History of Present Illness: Johnathan Murray is a 69 year old right-handed male history of diabetes mellitus, PAF maintained on amiodarone as well as Eliquis, hypertension, bioprosthetic aortic valve replacement 2017, lumbar laminectomy decompression 06/11/2019 as well as recent C3-4 anterior cervical discectomy with interbody fusion for cervical stenosis myelopathy 03/02/2020 per [name]. Patient initially presented 03/19/2020 with low-grade fever and weakness  of lower extremities.  Lactic acid 1.6, sodium 130, BUN 98, creatinine 6.40 with latest prior creatinine 1.63 on 02/24/2020, WBC 12,600 hemoglobin 10.4 CK of 20, SARS coronavirus negative.  His LFTs were markedly elevated.  CT of abdomen pelvis well a CT cervical spine unremarkable.  MRI cervical spine negative for infection.  Patient was hypotensive received IV fluid bolus.  Placed on broad-spectrum antibiotics for suspected sepsis.  MRI lumbar spine findings consistent with acute osteomyelitis discitis L4-5.  Evidence for associated soft tissue infection cellulitis within the psoas musculature bilaterally with a few small superimposed abscesses.  Additionally multilevel degenerative spondylosis most pronounced at L3-4.  Neurosurgery consulted no plan for surgical intervention he did undergo aspiration of disc space L4-5.  TEE showed no obvious vegetation and follow-up cardiology services.  Orthopedic service was consulted Dr. Mardelle Matte for bilateral knee pain with noted swelling of left knee.  X-ray showed diffuse degenerative changes as well as large joint effusion.  Doppler showed ruptured Baker's cyst on left lower extremity.  Patient underwent left lower extremity joint aspiration 03/21/2020 and right lower extremity joint aspiration 03/22/2020 both showing intracellular calcium pyrophosphate crystals WBC was up to 15,000 culture grew rare E Faecalis.  Patient did receive steroid injection both knees.  Underwent I&D bilateral knees 03/24/2020.  Infectious disease consulted continue to follow maintained on intravenous ampicillin x7 to 8 weeks through 05/20/2020 PICC line was placed 03/29/2020.  AKI on CKD stage III continue to improve multifactorial from bacteremia with latest creatinine 1.39.  Normocytic anemia postoperative patient transfused 2 units packed red blood cells.  He was admitted to inpatient rehab services 04/07/2020 for ongoing comprehensive inpatient rehab services with slow progressive gains.  Bouts of  tachycardia followed by cardiology services his amiodarone had since been resumed now that his renal function had improved.  His Eliquis was ongoing as prior to admission.  Patient with persistent bouts of atrial fibrillation with RVR underwent TEE/DCCV 04/29/2020 per cardiology services that showed, bioprosthetic aortic valve well-seated without an perivalvular regurgitation.  No LA/LAA thrombus or masses and was initially placed on IV amiodarone transition to p.o. amiodarone as well as low-dose beta-blocker.  Ceftriaxone ampicillin ongoing again followed by infectious disease.  Therapy evaluations completed and patient was admitted to inpatient rehab services 04/30/2020 for comprehensive rehab therapy.  On 05/01/2020 reports increasing left knee pain follow-up orthopedic services aspiration completed of knee demonstrated more than 100,000 white blood cells positive crystals.  Fluid was not able to be obtained from right knee and due to these findings suspect bilateral septic knees patient was transferred again back to acute care services 05/02/2020 undergoing total synovectomy irrigation debridement of bilateral knees application of wound VAC 05/02/2020 per Dr. Sharol Given.  Patient with scheduled pain management OxyContin 10 mg every 12 hours as well as Lyrica 100 mg 3 times daily.  He again presently continues on ampicillin as well as Rocephin through 05/20/2020.  His Eliquis currently remains on hold cardiac rate controlled amiodarone as directed.  Patient is again recommended for inpatient rehab services to continue aggressive inpatient rehab services.   [facility] has been reviewed by the rehabilitation admission coordinator and physician.   Past Medical History      Past Medical History:  Diagnosis Date  . Abnormal LFTs (liver function tests) 10/31/2016  . Abnormal nuclear stress test 05/03/2018  . Acid reflux 02/15/2018    not at present  . Acute otitis externa of left ear  11/13/2019  . Acute suppurative otitis media of left ear without spontaneous rupture of tympanic membrane 11/13/2019  . Anemia      low iron  . Aortic stenosis 05/03/2018  . Aortic valve abscess 04/13/2020  . ARF (acute renal failure) ([place]) 03/20/2020  . Arthritis 02/15/2018    Overview:  shoulder  . Bacteremia due to Enterococcus 03/21/2020  . Cervical myelopathy ([place]) 04/22/2019  . Conductive hearing loss of left ear 11/13/2019  . Coronary artery disease involving native coronary artery of native heart without angina pectoris 05/30/2016  . Coronary artery disease of native artery of native heart with stable angina pectoris ([place])      pt. denies  . Diabetes mellitus due to underlying condition with unspecified complications ([place]) 2/53/6644  . Discitis 04/07/2020  . Dyslipidemia 05/30/2016  . Dyspnea      upon exertion,  as of 06/05/09 not having any recent chest pain  . Dysrhythmia      a fib  . Endocarditis 04/13/2020  . Essential hypertension 05/30/2016  . Heart murmur      Has had an Aortic valve replacement  . History of fusion of cervical spine 03/23/2020  . Hyperactive gag reflex 02/15/2018  . Hypothyroidism    . Intermittent claudication ([place]) 10/31/2018  .  Lumbar stenosis with neurogenic claudication 06/11/2019  . Macular degeneration 02/15/2018  . PAF (paroxysmal atrial fibrillation) (H[facility]) 05/30/2016  . Pre-operative cardiovascular examination 01/03/2020  . Preoperative cardiovascular examination 04/17/2019  . Prosthetic valve endocarditis ([facility]) 04/13/2020  . Protein-calorie malnutrition, severe 03/24/2020  . Pseudogout of left knee 03/21/2020  . Renal insufficiency 01/17/2020  . S/P aortic valve replacement 01/17/2020  . S/P AVR 01/17/2020  . Septic arthritis ([facility]) 03/23/2020  . Tinnitus of left ear 11/13/2019  . Typical atrial flutter (H[facility])        Family History   family history includes Brain cancer in his mother; Heart disease in his maternal grandmother; Prostate cancer in his  brother.   Prior Rehab/Hospitalizations Has the patient had prior rehab or hospitalizations prior to admission? Yes   Has the patient had major surgery during 100 days prior to admission? Yes              Current Medications   Current Facility-Administered Medications:  .  acetaminophen (TYLENOL) tablet 650 mg, 650 mg, Oral, Q6H PRN **OR** acetaminophen (TYLENOL) suppository 650 mg, 650 mg, Rectal, Q6H PRN, Smith, Rondell A, MD .  albuterol (PROVENTIL) (2.5 MG/3ML) 0.083% nebulizer solution 2.5 mg, 2.5 mg, Nebulization, Q6H PRN, Katrinka Blazing, Rondell A, MD .  amiodarone (PACERONE) tablet 200 mg, 200 mg, Oral, BID, 200 mg at 05/13/20 0941 **FOLLOWED BY** [START ON 05/24/2020] amiodarone (PACERONE) tablet 200 mg, 200 mg, Oral, Daily, Leroy Sea, MD .  Melene Muller ON 05/21/2020] amoxicillin (AMOXIL) capsule 500 mg, 500 mg, Oral, Q12H, Dixon, Stephanie N, NP .  ampicillin (OMNIPEN) 2 g in sodium chloride 0.9 % 100 mL IVPB, 2 g, Intravenous, Q4H, Dixon, Stephanie N, NP, Last Rate: 300 mL/hr at 05/13/20 1433, 2 g at 05/13/20 1433 .  cefTRIAXone (ROCEPHIN) 2 g in sodium chloride 0.9 % 100 mL IVPB, 2 g, Intravenous, Q12H, Dixon, Stephanie N, NP, Last Rate: 200 mL/hr at 05/13/20 0517, 2 g at 05/13/20 0517 .  Chlorhexidine Gluconate Cloth 2 % PADS 6 each, 6 each, Topical, Daily, Clydie Braun, MD, 6 each at 05/12/20 1508 .  cyclobenzaprine (FLEXERIL) tablet 5 mg, 5 mg, Oral, TID PRN, Madelyn Flavors A, MD, 5 mg at 05/10/20 2118 .  feeding supplement (ENSURE ENLIVE) (ENSURE ENLIVE) liquid 237 mL, 237 mL, Oral, TID BM, Smith, Rondell A, MD, 237 mL at 05/13/20 0955 .  ferrous fumarate-b12-vitamic C-folic acid (TRINSICON / FOLTRIN) capsule 1 capsule, 1 capsule, Oral, Q breakfast, Katrinka Blazing, Rondell A, MD, 1 capsule at 05/13/20 0941 .  ferrous sulfate tablet 325 mg, 325 mg, Oral, BID WC, Leroy Sea, MD, 325 mg at 05/13/20 0941 .  insulin aspart (novoLOG) injection 0-20 Units, 0-20 Units, Subcutaneous, TID WC,  Madelyn Flavors A, MD, 3 Units at 05/13/20 1215 .  insulin aspart (novoLOG) injection 0-5 Units, 0-5 Units, Subcutaneous, QHS, Madelyn Flavors A, MD, 3 Units at 05/02/20 2130 .  insulin glargine (LANTUS) injection 10 Units, 10 Units, Subcutaneous, BID, Maretta Bees, MD, 10 Units at 05/13/20 [phone] .  levothyroxine (SYNTHROID) tablet 25 mcg, 25 mcg, Oral, QAC breakfast, Madelyn Flavors A, MD, 25 mcg at 05/13/20 0516 .  morphine 2 MG/ML injection 1-2 mg, 1-2 mg, Intravenous, Q4H PRN, Maretta Bees, MD, 2 mg at 05/13/20 1034 .  oxyCODONE (Oxy IR/ROXICODONE) immediate release tablet 5 mg, 5 mg, Oral, Q4H PRN, Madelyn Flavors A, MD, 5 mg at 05/13/20 0158 .  oxyCODONE (OXYCONTIN) 12 hr tablet 10 mg, 10 mg, Oral, Q12H, Smith, Rondell  A, MD, 10 mg at 05/13/20 0941 .  polyethylene glycol (MIRALAX / GLYCOLAX) packet 17 g, 17 g, Oral, Daily, Ghimire, Werner Lean, MD, 17 g at 05/12/20 0800 .  pregabalin (LYRICA) capsule 100 mg, 100 mg, Oral, TID, Katrinka Blazing, Rondell A, MD, 100 mg at 05/13/20 0941 .  senna (SENOKOT) tablet 8.6 mg, 1 tablet, Oral, QHS, Smith, Rondell A, MD, 8.6 mg at 05/12/20 2151 .  sodium chloride flush (NS) 0.9 % injection 10-40 mL, 10-40 mL, Intracatheter, Q12H, Smith, Rondell A, MD, 20 mL at 05/13/20 0956   Patients Current Diet:     Diet Order                      Diet Carb Modified Fluid consistency: Thin; Room service appropriate? Yes  Diet effective now                      Precautions / Restrictions Precautions Precautions: Sternal, Fall, Other (comment) Precaution Comments: freq cues to adhere to sternal prec Spinal Brace: Lumbar corset Other Brace: for comfort, pt reports "it doesn't really help me" so we didn't use back brace Restrictions Weight Bearing Restrictions: No Other Position/Activity Restrictions: sternal precautions    Has the patient had 2 or more falls or a fall with injury in the past year? No   Prior Activity Level Community (5-7x/wk): up until ACDF  in April, pt was independent, but has been significantly limited by pain since that time, and with prolonged hospitalization (since 4/29) is profoundly debilitated   Prior Functional Level Self Care: Did the patient need help bathing, dressing, using the toilet or eating? Independent   Indoor Mobility: Did the patient need assistance with walking from room to room (with or without device)? Independent   Stairs: Did the patient need assistance with internal or external stairs (with or without device)? Independent   Functional Cognition: Did the patient need help planning regular tasks such as shopping or remembering to take medications? Independent   Home Assistive Devices / Equipment Home Equipment: Grab bars - tub/shower, Shower seat - built in, [employer] - single point, Environmental consultant - 2 wheels   Prior Device Use: Indicate devices/aids used by the patient prior to current illness, exacerbation or injury? Walker   Current Functional Level Cognition   Overall Cognitive Status: Within Functional Limits for tasks assessed Orientation Level: Oriented X4 General Comments: cognition not specifically tested    Extremity Assessment (includes Sensation/Coordination)   Upper Extremity Assessment: Generalized weakness  Lower Extremity Assessment: Defer to PT evaluation RLE Deficits / Details: Ankle, Hip grossly WFL; substantial VAC dressing surrounding R knee; very painful RLE: Unable to fully assess due to pain LLE Deficits / Details: anke and hip grossly WFL; knee with substantial VAC dressing      ADLs   Overall ADL's : Needs assistance/impaired Eating/Feeding: Set up, Bed level Grooming: Set up, Bed level Upper Body Bathing: Moderate assistance, Bed level Lower Body Bathing: Total assistance, Bed level Upper Body Dressing : Minimal assistance, Sitting Lower Body Dressing: Total assistance, Bed level Toilet Transfer: Moderate assistance, Maximal assistance, +2 for physical assistance, +2 for  safety/equipment, RW, Stand-pivot Toilet Transfer Details (indicate cue type and reason): simulated to recliner; cues for safety to maintain sternal precautions during mobility Toileting- Clothing Manipulation and Hygiene: Total assistance, Bed level Functional mobility during ADLs: Minimal assistance General ADL Comments: Worke don seated EOB tolerance to prep for OOB ADLs     Mobility   Overal  bed mobility: Needs Assistance Bed Mobility: Supine to Sit, Sit to Supine Rolling: Mod assist (rail) Sidelying to sit: Mod assist (HOB elevated) Supine to sit: Mod assist, HOB elevated Sit to supine: Mod assist Sit to sidelying: Mod assist, +2 for safety/equipment General bed mobility comments: pt wanted to try [facility] elevated and "walk" his legs off side of bed (felt his back could handle this technique), required mod assist to advance hips to edge of bed as pt unable to push with UEs to advance his pelvis towards EOB, and could not "walk" each hip forward with lateral leaning     Transfers   Overall transfer level: Needs assistance Equipment used: None Transfer via Lift Equipment: Marketing executive Transfers: Sit to/from Stand Sit to Stand: +2 safety/equipment (stood with motorized lift) Stand pivot transfers: Mod assist  Lateral/Scoot Transfers: Mod assist General transfer comment: bed elevated due to lack of knee flexion; on return to sit at EOB, bed remained elevated due to decr knee flexion     Ambulation / Gait / Stairs / Wheelchair Mobility   Ambulation/Gait Ambulation/Gait assistance: +2 safety/equipment, Min assist (assist with Sara-Plus) Gait Distance (Feet): 16 Feet (forward and backward 4 ft x2; seated rest and repeated x1) Assistive device:  (Sara-Plus) Gait Pattern/deviations: Step-to pattern, Decreased stride length, Trunk flexed General Gait Details: 2 person assist to guide Sara-Plus and manage IV/monitor; distance limited by need to be able to pull bed up if needed in order to have  elevated surface for pt to return to sitting Gait velocity: decreased Gait velocity interpretation: <1.31 ft/sec, indicative of household ambulator     Posture / Balance Dynamic Sitting Balance Sitting balance - Comments: sat eOB for LE exercises and balance x 10 minutes Balance Overall balance assessment: Needs assistance Sitting-balance support: Feet supported, No upper extremity supported Sitting balance-Leahy Scale: Fair Sitting balance - Comments: sat eOB for LE exercises and balance x 10 minutes Standing balance support: Bilateral upper extremity supported, Single extremity supported, During functional activity Standing balance-Leahy Scale: Poor Standing balance comment: reliant on external assist or AD     Special needs/care consideration Skin incisions to bilat knees, sternal incision healed, Behavioral consideration would benefit from Neuropsych for coping and Designated visitor wife, [place] (from acute therapy documentation) Living Arrangements: Spouse/significant other  Lives With: Spouse Available Help at Discharge: Family, Available 24 hours/day Type of Home: House Home Layout: One level Home Access: Stairs to enter Entrance Stairs-Rails: Right, Left Entrance Stairs-Number of Steps: 8 Bathroom Shower/Tub: Multimedia programmer: Handicapped height Bathroom Accessibility: Yes How Accessible: Accessible via walker Additional Comments: Grab bars in shower with built in seat, no grab bars near toilet   Discharge Living Setting Plans for Discharge Living Setting: Patient's home Type of Home at Discharge: House Discharge Home Layout: One level Discharge Home Access: Stairs to enter Entrance Stairs-Rails: Right, Left Entrance Stairs-Number of Steps: 7 Discharge Bathroom Shower/Tub: Walk-in shower, Tub only Discharge Bathroom Toilet: Standard Discharge Bathroom Accessibility: Yes How Accessible: Accessible via walker Does the  patient have any problems obtaining your medications?: No   Social/Family/Support Systems Patient Roles: Spouse Anticipated Caregiver: Wife- intends to take FMLA Anticipated Caregiver's Contact Information: [name] [phone] Ability/Limitations of Caregiver: None reported Caregiver Availability: 24/7 Discharge Plan Discussed with Primary Caregiver: Yes Is Caregiver In Agreement with Plan?: Yes Does Caregiver/Family have Issues with Lodging/Transportation while Pt is in Rehab?: No   Goals Patient/Family Goal for Rehab: PT/OT supervision to mod I, SLP n/a Expected  length of stay: 16-19 days Additional Information: multiple admissions acute<>rehab Pt/Family Agrees to Admission and willing to participate: Yes Program Orientation Provided & Reviewed with Pt/Caregiver Including Roles  & Responsibilities: Yes   Decrease burden of Care through IP rehab admission: n/a   Possible need for SNF placement upon discharge: Not anticipated   Patient Condition: I have reviewed medical records from [facility], spoken with CM, and patient and spouse. I met with patient at the bedside for inpatient rehabilitation assessment.  Patient will benefit from ongoing PT and OT, can actively participate in 3 hours of therapy a day 5 days of the week, and can make measurable gains during the admission.  Patient will also benefit from the coordinated team approach during an Inpatient Acute Rehabilitation admission.  The patient will receive intensive therapy as well as Rehabilitation physician, nursing, social worker, and care management interventions.  Due to safety, skin/wound care, disease management, medication administration, pain management and patient education the patient requires 24 hour a day rehabilitation nursing.  The patient is currently mod assist +2 with mobility and basic ADLs.  Discharge setting and therapy post discharge at home with home health is anticipated.  Patient has agreed to  participate in the Acute Inpatient Rehabilitation Program and will admit today.   Preadmission Screen Completed By:  Michel Santee, PT, DPT 05/13/2020 3:15 PM ______________________________________________________________________   Discussed status with Dr. Dagoberto Ligas on 05/13/20  at 3:15 PM  and received approval for admission today.   Admission Coordinator:  Michel Santee, PT, DPT time 3:15 PM Sudie Grumbling 05/13/20     Assessment/Plan: Diagnosis: 1. Does the need for close, 24 hr/day Medical supervision in concert with the patient's rehab needs make it unreasonable for this patient to be served in a less intensive setting? Yes 2. Co-Morbidities requiring supervision/potential complications: discitis/osteomyelitis, B/L septic knees, A Fib, severe pain 3. Due to bladder management, bowel management, safety, skin/wound care, disease management, medication administration, pain management and patient education, does the patient require 24 hr/day rehab nursing? Yes 4. Does the patient require coordinated care of a physician, rehab nurse, PT, OT, and SLP to address physical and functional deficits in the context of the above medical diagnosis(es)? Yes Addressing deficits in the following areas: balance, endurance, locomotion, strength, transferring, bathing, dressing, feeding, grooming and toileting 5. Can the patient actively participate in an intensive therapy program of at least 3 hrs of therapy 5 days a week? Yes 6. The potential for patient to make measurable gains while on inpatient rehab is good 7. Anticipated functional outcomes upon discharge from inpatient rehab: modified independent and supervision PT, modified independent and supervision OT, n/a SLP 8. Estimated rehab length of stay to reach the above functional goals is: 16-19 days 9. Anticipated discharge destination: Home 10. Overall Rehab/Functional Prognosis: good     MD Signature:          Revision History                      Note Details  Jan Fireman, MD File Time 05/14/2020 11:34 AM  Author Type Physician Status Signed  Last Editor Courtney Heys, MD Service Physical Medicine and [place] # [phone] Admit Date 05/14/2020

## 2020-05-14 NOTE — IPOC Note (Signed)
Individualized overall Plan of Care (IPOC) Patient Details Name: Johnathan Murray MRN: [id number] DOB: 11/26/1950  Admitting Diagnosis: Debility  Hospital Problems: Principal Problem:   Debility Active Problems:   Diabetes mellitus due to underlying condition with unspecified complications ([place])   PAF (paroxysmal atrial fibrillation) ([place])   S/P AVR   Bacteremia due to Enterococcus   Septic arthritis of knee, bilateral ([facility])   Discitis   Pressure injury of skin   Postoperative pain   Transaminitis   Hypoalbuminemia due to protein-calorie malnutrition ([place])   Drug induced constipation   Controlled type 2 diabetes mellitus with hyperglycemia, with long-term current use of insulin ([facility])   AKI (acute kidney injury) ([place])     Functional Problem List: Nursing Endurance, Medication Management, Pain, Skin Integrity, Safety  PT Balance, Edema, Endurance, Motor, Pain, Safety, Skin Integrity  OT Balance, Endurance, Motor, Pain, Safety  SLP    TR         Basic ADL's: OT Bathing, Dressing, Toileting, Grooming     Advanced  ADL's: OT       Transfers: PT Bed Mobility, Bed to Chair, Car, Manufacturing systems engineer, Metallurgist: PT Ambulation, Stairs     Additional Impairments: OT None  SLP        TR      Anticipated Outcomes Item Anticipated Outcome  Self Feeding    Swallowing      Basic self-care  Supervision  Toileting  Supervision   Bathroom Transfers Supervision  Bowel/Bladder  cont x 2, min assist  Transfers  mod I  Locomotion  mod I  Communication     Cognition     Pain  pain less than 4  Safety/Judgment  supervision assist   Therapy Plan: PT Intensity: Minimum of 1-2 x/day ,45 to 90 minutes PT Frequency: 5 out of 7 days PT Duration Estimated Length of Stay: 2.5-3 weeks OT Intensity: Minimum of 1-2 x/day, 45 to 90 minutes OT Frequency: 5 out of 7 days OT Duration/Estimated Length of Stay: 2.5-3 weeks      Team  Interventions: Nursing Interventions Patient/Family Education, Disease Management/Prevention, Pain Management, Medication Management, Skin Care/Wound Management, Discharge Planning  PT interventions Ambulation/gait training, Community reintegration, DME/adaptive equipment instruction, Neuromuscular re-education, IT trainer, UE/LE Strength taining/ROM, Wheelchair propulsion/positioning, Training and development officer, Discharge planning, Functional electrical stimulation, Pain management, Therapeutic Activities, UE/LE Coordination activities, Cognitive remediation/compensation, Disease management/prevention, Functional mobility training, Patient/family education, Therapeutic Exercise  OT Interventions Community reintegration, Discharge planning, DME/adaptive equipment instruction, Functional mobility training, Pain management, Patient/family education, Self Care/advanced ADL retraining, Psychosocial support, Therapeutic Activities, Therapeutic Exercise, UE/LE Strength taining/ROM, UE/LE Coordination activities, Skin care/wound managment, Balance/vestibular training  SLP Interventions    TR Interventions    SW/CM Interventions Discharge Planning, Psychosocial Support, Patient/Family Education   Barriers to Discharge MD  Medical stability, Wound care, Weight bearing restrictions, and [place]  Nursing      PT Home environment access/layout, Inaccessible home environment, Decreased caregiver support, IV antibiotics, Weight bearing restrictions wife works during day, 7-8 STE home no rail, sternotomy precautions  OT Home environment access/layout (Sternal precautions) 8 STE  SLP      SW Lack of/limited family support, Inaccessible home environment Spouse works during day  8 steps to enter home   Team Discharge Planning: Destination: PT-Home ,OT- Home , SLP-  Projected Follow-up: PT-Home health PT, 24 hour supervision/assistance, OT-  Home health OT, 24 hour supervision/assistance, SLP-  Projected  Equipment Needs: PT-To be determined, OT- To be  determined, SLP-  Equipment Details: PT- , OT-  Patient/family involved in discharge planning: PT- Patient, Family member/caregiver,  OT-Patient, Family member/caregiver, SLP-   MD ELOS: 15-18 days. Medical Rehab Prognosis:  Good Assessment: 69 year old right-handed male history of diabetes mellitus, PAF maintained on amiodarone as well as Eliquis, hypertension, bioprosthetic aortic valve replacement 2017, lumbar laminectomy decompression 06/11/2019 as well as recent C3-4 anterior cervical discectomy with interbody fusion for cervical stenosis myelopathy 03/02/2020 per [name].  Presented 03/19/2020 with low-grade fever and weakness of lower extremities.  Lactic acid 1.6, sodium 130, BUN 98, creatinine 6.40 with latest prior creatinine 1.6345 2021, WBC 12,600, hemoglobin 10.4, CK 20, urinalysis showed RBCs of 21-50 WBC 21-50 protein around 100 bacteria rare hyaline cast, SARS coronavirus negative.  His LFTs were markedly elevated AST 415 ALT 429 total bilirubin 5.5.  CT abdomen pelvis as well as CT cervical spine unremarkable.  MRI cervical spine negative for infection.  Patient was hypotensive received fluid bolus.  Placed on broad-spectrum antibiotics for suspected sepsis.  MRI lumbar spine findings consistent with acute osteomyelitis discitis L4-5.  Evidence for associated soft tissue infection cellulitis within the psoas musculature bilaterally with a few small superimposed abscesses.  Additional multilevel degenerative spondylosis most pronounced at L3-4.  Neurosurgery consulted no plan for surgical intervention underwent aspiration of disc space L4-5.  TEE showed no obvious vegetation and follow-up cardiology services.  Orthopedic services consulted Dr. Mardelle Matte for bilateral knee pain with noted swelling of left knee.  X-ray showed diffuse degenerative changes as well as large joint effusion.  Doppler showed ruptured Baker's cyst on the left lower extremity.   Patient underwent left lower extremity joint aspiration 03/21/2020 and right lower extremity joint aspiration 03/22/2020 both showing intracellular calcium pyrophosphate crystals WBC was up to 15,000 culture grew rare E Faecalis.  Patient did receive steroid injection of both knees.  Underwent I&D bilateral knees 03/24/2020.  Infectious disease continue to follow maintained on intravenous ampicillin x7 to 8 weeks through 05/20/2020 PICC line was placed 03/29/2020.  AKI on CKD stage III continue to improve multifactorial from bacteremia.  Normocytic anemia postoperative patient was transfused 2 units packed red blood cells.  Patient was admitted to inpatient rehab services 04/07/2020 for ongoing comprehensive inpatient rehab services.  Slow progressive gains with noted bouts of tachycardia followed by cardiology services.  His amiodarone had since been resumed now that his renal function had improved.  His Eliquis was ongoing as prior to admission.  Patient with bouts of low-grade fever followed by infectious disease.  Ceftriaxone was added to patient's ampicillin for increased coverage.  A follow-up TEE was completed findings of thoracic root abscess and patient was transferred to acute care services for ongoing care 04/13/2020.  Patient underwent redo sternotomy with ascending aortic root replacement 04/16/2020 per Dr. Cyndia Bent.  His Eliquis has been resumed.  Patient with persistent bouts of A. fib with RVR undergoing TEE/DCCV 04/29/2020 per cardiology services that showed,  bioprosthetic aortic valve well-seated without an perivalvular regurgitation.  No LA/LAA thrombus or masses and was initially placed on IV amiodarone and transition to p.o. amiodarone 200 mg twice daily as well as low-dose beta-blocker and close monitoring of heart rate.  Ceftriaxone ampicillin ongoing again followed by infectious disease through 05/20/2020.  Therapy evaluations completed and patient was admitted back to inpatient rehab services for  comprehensive therapies. Patient with resulting functional deficits with mobility, transfers, endurance, self-care.  Will set goals for Supervision/Mod I with PT/OT.  Due to the current state of  emergency, patients may not be receiving their 3-hours of Medicare-mandated therapy.  See Team Conference Notes for weekly updates to the plan of care

## 2020-05-14 NOTE — Progress Notes (Signed)
Inpatient Rehabilitation Medication Review by a Pharmacist  A complete drug regimen review was completed for this patient to identify any potential clinically significant medication issues.  Clinically significant medication issues were identified:  no   Time spent performing this drug regimen review (minutes):  15 min   Arrie Senate, PharmD, BCPS Clinical Pharmacist Please check AMION for all [facility] numbers 05/14/2020

## 2020-05-14 NOTE — H&P (Signed)
Physical Medicine and Rehabilitation Admission H&P     HPI: Johnathan Murray is a 69 year old right-handed male history of diabetes mellitus, PAF maintained on amiodarone as well as Eliquis, hypertension, bioprosthetic aortic valve replacement 2017, lumbar laminectomy decompression 06/11/2019 as well as recent C3-4 anterior cervical discectomy with interbody fusion for cervical stenosis myelopathy 03/02/2020 per [name].  Per chart review lives with spouse 1 level home 7 steps to entry.  Use a rolling walker prior to admission.  Wife works during the day.  Patient initially presented 03/19/2020 with low-grade fever and weakness of lower extremities.  Lactic acid 1.6, sodium 130, BUN 98, creatinine 6.40 with latest prior creatinine 1.63 on 02/24/2020, WBC 12,600 hemoglobin 10.4 CK of 20, SARS coronavirus negative.  His LFTs were markedly elevated.  CT of abdomen pelvis well a CT cervical spine unremarkable.  MRI cervical spine negative for infection.  Patient was hypotensive received IV fluid bolus.  Placed on broad-spectrum antibiotics for suspected sepsis.  MRI lumbar spine findings consistent with acute osteomyelitis discitis L4-5.  Evidence for associated soft tissue infection cellulitis within the psoas musculature bilaterally with a few small superimposed abscesses.  Additionally multilevel degenerative spondylosis most pronounced at L3-4.  Neurosurgery consulted no plan for surgical intervention he did undergo aspiration of disc space L4-5.  TEE showed no obvious vegetation and follow-up cardiology services.  Orthopedic service was consulted Dr. Mardelle Matte for bilateral knee pain with noted swelling of left knee.  X-ray showed diffuse degenerative changes as well as large joint effusion.  Doppler showed ruptured Baker's cyst on left lower extremity.  Patient underwent left lower extremity joint aspiration 03/21/2020 and right lower extremity joint aspiration 03/22/2020 both showing intracellular calcium  pyrophosphate crystals WBC was up to 15,000 culture grew rare E Faecalis.  Patient did receive steroid injection both knees.  Underwent I&D bilateral knees 03/24/2020.  Infectious disease consulted continue to follow maintained on intravenous ampicillin x7 to 8 weeks through 05/20/2020 PICC line was placed 03/29/2020.  AKI on CKD stage III continue to improve multifactorial from bacteremia with latest creatinine 1.39.  Normocytic anemia postoperative patient transfused 2 units packed red blood cells.  He was admitted to inpatient rehab services 04/07/2020 for ongoing comprehensive inpatient rehab services with slow progressive gains.  Bouts of tachycardia followed by cardiology services his amiodarone had since been resumed now that his renal function had improved.  His Eliquis was ongoing as prior to admission.  Patient with persistent bouts of atrial fibrillation with RVR underwent TEE/DCCV 04/29/2020 per cardiology services that showed, bioprosthetic aortic valve well-seated without an perivalvular regurgitation.  No LA/LAA thrombus or masses and was initially placed on IV amiodarone transition to p.o. amiodarone as well as low-dose beta-blocker.  Ceftriaxone ampicillin ongoing again followed by infectious disease.  Therapy evaluations completed and patient was admitted to inpatient rehab services 04/30/2020 for comprehensive rehab therapy.  On 05/01/2020 reports increasing left knee pain follow-up orthopedic services aspiration completed of knee demonstrated more than 100,000 white blood cells positive crystals.  Fluid was not able to be obtained from right knee and due to these findings suspect bilateral septic knees patient was transferred again back to acute care services 05/02/2020 undergoing total synovectomy irrigation debridement of bilateral knees application of wound VAC 05/02/2020 per Dr. Sharol Given that have since been discontinued.  Patient with scheduled pain management OxyContin 10 mg every 12 hours as well as  Lyrica 100 mg 3 times daily.  He again presently continues on ampicillin as well  as Rocephin through 05/20/2020.  His Eliquis currently remains on hold cardiac rate controlled amiodarone as directed with plan to resume blood thinner 05/15/2020.  Patient is again admitted to inpatient rehab services to continue aggressive inpatient rehab services.  Pt reports LBM today- just had it- using bedpan to go; voiding with urinal.  Notes heart rate with monitor usually 100-120s at rest.  Review of Systems  Constitutional: Positive for fever. Negative for chills.  HENT: Negative for hearing loss.   Eyes: Negative for blurred vision and double vision.  Respiratory: Positive for shortness of breath.   Cardiovascular: Positive for palpitations and leg swelling.  Gastrointestinal: Positive for constipation. Negative for nausea and vomiting.  Genitourinary: Positive for urgency. Negative for dysuria, flank pain and hematuria.  Musculoskeletal: Positive for back pain, joint pain and myalgias.  Skin: Negative for rash.  Neurological: Positive for weakness.  All other systems reviewed and are negative.  Past Medical History:  Diagnosis Date  . Abnormal LFTs (liver function tests) 10/31/2016  . Abnormal nuclear stress test 05/03/2018  . Acid reflux 02/15/2018   not at present  . Acute otitis externa of left ear 11/13/2019  . Acute suppurative otitis media of left ear without spontaneous rupture of tympanic membrane 11/13/2019  . Anemia    low iron  . Aortic stenosis 05/03/2018  . Aortic valve abscess 04/13/2020  . ARF (acute renal failure) ([place]) 03/20/2020  . Arthritis 02/15/2018   Overview:  shoulder  . Bacteremia due to Enterococcus 03/21/2020  . Cervical myelopathy ([place]) 04/22/2019  . Conductive hearing loss of left ear 11/13/2019  . Coronary artery disease involving native coronary artery of native heart without angina pectoris 05/30/2016  . Coronary artery disease of native artery of native heart with  stable angina pectoris ([place])    pt. denies  . Diabetes mellitus due to underlying condition with unspecified complications ([place]) 6/94/8546  . Discitis 04/07/2020  . Dyslipidemia 05/30/2016  . Dyspnea    upon exertion,  as of 06/05/09 not having any recent chest pain  . Dysrhythmia    a fib  . Endocarditis 04/13/2020  . Essential hypertension 05/30/2016  . Heart murmur    Has had an Aortic valve replacement  . History of fusion of cervical spine 03/23/2020  . Hyperactive gag reflex 02/15/2018  . Hypothyroidism   . Intermittent claudication ([place]) 10/31/2018  . Lumbar stenosis with neurogenic claudication 06/11/2019  . Macular degeneration 02/15/2018  . PAF (paroxysmal atrial fibrillation) ([place]) 05/30/2016  . Pre-operative cardiovascular examination 01/03/2020  . Preoperative cardiovascular examination 04/17/2019  . Prosthetic valve endocarditis ([place]) 04/13/2020  . Protein-calorie malnutrition, severe 03/24/2020  . Pseudogout of left knee 03/21/2020  . Renal insufficiency 01/17/2020  . S/P aortic valve replacement 01/17/2020  . S/P AVR 01/17/2020  . Septic arthritis ([place]) 03/23/2020  . Tinnitus of left ear 11/13/2019  . Typical atrial flutter [facility])    Past Surgical History:  Procedure Laterality Date  . ANTERIOR CERVICAL DECOMP/DISCECTOMY FUSION N/A 04/22/2019   Procedure: Anterior Cervical Discectomy and Fusion, Cervical four-five, Cervical five-six;  Surgeon: Earnie Larsson, MD;  Location: [place];  Service: Neurosurgery;  Laterality: N/A;  . ANTERIOR CERVICAL DECOMP/DISCECTOMY FUSION N/A 03/02/2020   Procedure: Anterior Cervical Decompression Fusion  - Cervical three-Cervical four;  Surgeon: Earnie Larsson, MD;  Location: [place];  Service: Neurosurgery;  Laterality: N/A;  . AORTIC VALVE REPLACEMENT    . ASCENDING AORTIC ROOT REPLACEMENT N/A 04/16/2020   Procedure: ASCENDING AORTIC ROOT REPLACEMENT - HOMOGRAFT using [employer] 23 MM Cryo Aortic Valve.;  Surgeon: Gaye Pollack, MD;  Location: [facility] OR;   Service: Open Heart Surgery;  Laterality: N/A;  . CARDIAC CATHETERIZATION     several stents  . CARDIAC SURGERY    . CARDIOVERSION N/A 04/13/2020   Procedure: CARDIOVERSION;  Surgeon: Geralynn Rile, MD;  Location: [place];  Service: Cardiovascular;  Laterality: N/A;  . CARDIOVERSION N/A 04/29/2020   Procedure: CARDIOVERSION;  Surgeon: Skeet Latch, MD;  Location: [facility] ENDOSCOPY;  Service: Cardiovascular;  Laterality: N/A;  . CORONARY ARTERY BYPASS GRAFT  2017   patient unsure how many, had at [facility]  . HAND SURGERY    . I & D EXTREMITY Bilateral 05/02/2020   Procedure: IRRIGATION AND DEBRIDEMENT BILATERAL KNEES;  Surgeon: Newt Minion, MD;  Location: [place];  Service: Orthopedics;  Laterality: Bilateral;  . IR LUMBAR DISC ASPIRATION W/IMG GUIDE  04/07/2020  . IRRIGATION AND DEBRIDEMENT KNEE Bilateral 03/24/2020   Procedure: ARTHROSCOPY IRRIGATION AND DEBRIDEMENT BILATERAL  KNEE;  Surgeon: Marchia Bond, MD;  Location: [place];  Service: Orthopedics;  Laterality: Bilateral;  . KNEE SURGERY    . LEFT HEART CATH AND CORONARY ANGIOGRAPHY N/A 01/21/2020   Procedure: LEFT HEART CATH AND CORONARY ANGIOGRAPHY;  Surgeon: Belva Crome, MD;  Location: [place] CV LAB;  Service: Cardiovascular;  Laterality: N/A;  . LEFT HEART CATH AND CORS/GRAFTS ANGIOGRAPHY N/A 05/16/2018   Procedure: LEFT HEART CATH AND CORS/GRAFTS ANGIOGRAPHY;  Surgeon: Burnell Blanks, MD;  Location: [place] CV LAB;  Service: Cardiovascular;  Laterality: N/A;  . LUMBAR LAMINECTOMY/DECOMPRESSION MICRODISCECTOMY Bilateral 06/11/2019   Procedure: Bilateral Lumbar Four-FiveLaminectomy/Foraminotomy;  Surgeon: Earnie Larsson, MD;  Location: [place];  Service: Neurosurgery;  Laterality: Bilateral;  posterior  . SYNOVECTOMY Bilateral 05/02/2020   Procedure: SYNOVECTOMY;  Surgeon: Newt Minion, MD;  Location: [place];  Service: Orthopedics;  Laterality: Bilateral;  . TEE WITHOUT CARDIOVERSION N/A 03/26/2020     Procedure: TRANSESOPHAGEAL ECHOCARDIOGRAM (TEE);  Surgeon: Jerline Pain, MD;  Location: [facility] ENDOSCOPY;  Service: Cardiovascular;  Laterality: N/A;  . TEE WITHOUT CARDIOVERSION N/A 04/13/2020   Procedure: TRANSESOPHAGEAL ECHOCARDIOGRAM (TEE);  Surgeon: Geralynn Rile, MD;  Location: [place];  Service: Cardiovascular;  Laterality: N/A;  . TEE WITHOUT CARDIOVERSION N/A 04/16/2020   Procedure: TRANSESOPHAGEAL ECHOCARDIOGRAM (TEE);  Surgeon: Gaye Pollack, MD;  Location: [place];  Service: Open Heart Surgery;  Laterality: N/A;  . TEE WITHOUT CARDIOVERSION N/A 04/29/2020   Procedure: TRANSESOPHAGEAL ECHOCARDIOGRAM (TEE);  Surgeon: Skeet Latch, MD;  Location: [facility] ENDOSCOPY;  Service: Cardiovascular;  Laterality: N/A;   Family History  Problem Relation Age of Onset  . Prostate cancer Brother   . Heart disease Maternal Grandmother   . Brain cancer Mother    Social History:  reports that he has never smoked. He has never used smokeless tobacco. He reports previous alcohol use. He reports that he does not use drugs. Allergies:  Allergies  Allergen Reactions  . Atorvastatin Other (See Comments)    Urinary retention   . Nebivolol Diarrhea   Medications Prior to Admission  Medication Sig Dispense Refill  . amiodarone (PACERONE) 200 MG tablet Take 1 tablet (200 mg total) by mouth 2 (two) times daily.    Derrill Memo ON 05/24/2020] amiodarone (PACERONE) 200 MG tablet Take 1 tablet (200 mg total) by mouth daily.    Derrill Memo ON 05/21/2020] amoxicillin (AMOXIL) 500 MG capsule Take 1 capsule (500 mg total) by mouth 2 (two) times  daily. 60 capsule 3  . aspirin EC 81 MG EC tablet Take 1 tablet (81 mg total) by mouth daily. (Patient not taking: Reported on 05/03/2020)    . benazepril (LOTENSIN) 40 MG tablet Take 40 mg by mouth at bedtime.    . cefTRIAXone 2 g in sodium chloride 0.9 % 100 mL Inject 2 g into the vein every 12 (twelve) hours.    . Coenzyme Q10 (CO Q10) 200 MG CAPS Take 200 mg by mouth  daily.    Marland Kitchen ezetimibe (ZETIA) 10 MG tablet Take 10 mg by mouth at bedtime.    . ferrous sulfate 325 (65 FE) MG tablet Take 325 mg by mouth daily with breakfast.    . furosemide (LASIX) 40 MG tablet Take 40 mg by mouth at bedtime.    . insulin aspart (NOVOLOG) 100 UNIT/ML injection Substitute to any brand approved.Before each meal 3 times a day, 140-199 - 2 units, 200-250 - 4 units, 251-299 - 6 units,  300-349 - 8 units,  350 or above 10 units. Dispense syringes and needles as needed, Ok to switch to PEN if approved. DX DM2, Code E11.65    . insulin glargine (LANTUS) 100 UNIT/ML injection Inject 0.1 mLs (10 Units total) into the skin 2 (two) times daily. (Patient not taking: Reported on 05/03/2020) 10 mL 11  . levothyroxine (SYNTHROID) 25 MCG tablet Take 25 mcg by mouth daily before breakfast.     . metoprolol tartrate (LOPRESSOR) 25 MG tablet Take 1 tablet (25 mg total) by mouth 2 (two) times daily. (Patient not taking: Reported on 05/03/2020)    . oxyCODONE (OXYCONTIN) 10 mg 12 hr tablet Take 1 tablet (10 mg total) by mouth every 12 (twelve) hours. (Patient not taking: Reported on 05/03/2020) 14 tablet   . pregabalin (LYRICA) 100 MG capsule Take 1 capsule (100 mg total) by mouth 3 (three) times daily. (Patient not taking: Reported on 05/03/2020)    . senna (SENOKOT) 8.6 MG TABS tablet Take 1 tablet (8.6 mg total) by mouth at bedtime. (Patient not taking: Reported on 05/03/2020) 120 tablet 0  . vitamin B-12 (CYANOCOBALAMIN) 1000 MCG tablet Take 1,000 mcg by mouth daily.    . vitamin C (ASCORBIC ACID) 500 MG tablet Take 500 mg by mouth daily.     Marland Kitchen zinc gluconate 50 MG tablet Take 50 mg by mouth daily.      Drug Regimen Review Drug regimen was reviewed and remains appropriate with no significant issues identified  Home:     Functional History:    Functional Status:  Mobility:          ADL:    Cognition: Cognition Orientation Level: Oriented X4    Physical Exam: Blood pressure  110/81, pulse 91, temperature 99.6 F (37.6 C), temperature source Oral, resp. rate 16, height 5\' 9"  (1.753 m), weight 91 kg, SpO2 100 %. Physical Exam  Nursing note and vitals reviewed. Constitutional: He is oriented to person, place, and time. He does not appear ill. No distress.  Sitting up in bed; wife and daughter at bedside; today is pt's birthday, appropriate, NAD  HENT:  Head: Normocephalic and atraumatic.  Right Ear: External ear normal.  Left Ear: External ear normal.  Nose: Nose normal. No congestion.  Mouth/Throat: Mucous membranes are moist. No oropharyngeal exudate. Oropharynx is clear.  Eyes: Conjunctivae are normal. Right eye exhibits no discharge. Left eye exhibits no discharge.  Cardiovascular:  Sounds like regular rhythm, but an occ extra beat (usually in A  Fib)- rate 100-120s the entire time- by my exam as well as monitor No M/R/G Sternal incision healed/scarred  Respiratory: Effort normal and breath sounds normal. No stridor. No respiratory distress. He has no wheezes. He has no rhonchi. He has no rales. He exhibits no tenderness.  GI: Soft. Normal appearance and bowel sounds are normal. He exhibits no distension. There is no abdominal tenderness.  Musculoskeletal:     Cervical back: Normal range of motion and neck supple. No rigidity or tenderness.     Comments: UEs- 5-/5 in deltoids, biceps, triceps, WE, grip and finger abd B/L LEs- HF 2+/5 on R; 2/5 on L; KE 3-/5; DF and PF 4/5 B/L   Neurological: He is alert and oriented to person, place, and time. No sensory deficit.  Patient is alert in no acute distress sitting up in bed with wife at bedside. Sensation intact to light touch in all 4 extremities  Skin: Skin is warm and dry.  Bilateral knee incisions are dressed with staples intact without drainage.  Appropriately tender Localized erythema around incisions- significant swelling of L>R knees, but better than when saw last. Scabbed over skin tear on L inner  buttock- technically stage II and 2 tiny scabbed areas on R inner buttock- MASD around them- slightly- skin not otherwise open PICC RUE; peripheral IV L forearm   Psychiatric: Mood normal.    Results for orders placed or performed during the hospital encounter of 05/14/20 (from the past 48 hour(s))  Glucose, capillary     Status: Abnormal   Collection Time: 05/14/20  4:47 PM  Result Value Ref Range   Glucose-Capillary 124 (H) 70 - 99 mg/dL    Comment: Glucose reference range applies only to samples taken after fasting for at least 8 hours.   No results found.     Medical Problem List and Plan: 1.  Decreased functional mobility secondary to sepsis/Enterococcus faecalis bacteremia/discitis/osteomyelitis of L4-5 status post aortic root replacement 04/17/2020 status post cardioversion for atrial fibrillation.  Sternal precautions/lumbar corset for comfort x 4 more wees  -patient may  Shower if covers knee incisions B/L  -ELOS/Goals: 2-2.5 weeks- mod I to supervision 2.  Antithrombotics: -DVT/anticoagulation: Resume Eliquis 05/15/2020  -antiplatelet therapy: N/A 3. Pain Management: OxyContin sustained release 10 mg every 12 hours, Lyrica 100 mg 3 times daily Flexeril as as well as oxycodone needed 4. Mood: N/A meds- follow for ego support  -antipsychotic agents: N/A 5. Neuropsych: This patient is capable of making decisions on his own behalf. 6. Skin/Wound Care: Routine skin checks 7. Fluids/Electrolytes/Nutrition: Routine in and outs with follow-up chemistries 8.  ID/Enterococcus bacteremia.  Continue IV ampicillin 2 g every 4 hours as well as Rocephin 2 g every 12 hours through 05/20/2020 and stop.  Follow-up infectious disease 9.  Acute on chronic anemia.  Continue iron supplement.  Follow-up CBC 10.  Bilateral septic knees.  Status post total synovectomy irrigation debridement bilateral knees 05/02/2020 per Dr. Sharol Given.  Weightbearing as tolerated.Plan for staple removal this weekend, per  pt 11.  AKI.   follow-up chemistries 12.  PAF.  Follow-up cardiology services.  Amiodarone as directed.  Lopressor 12.5 mg twice daily.  Cardiac rate controlled 13.  Diabetes mellitus.  Latest hemoglobin A1c 6.8.  Lantus insulin 10 units twice daily. 14.  Recent C3-4 ACDF 03/02/2020 per [name] for cervical myelopathy.  Cervical collar discontinued 15.  Hypothyroidism.  Synthroid 16.  Constipation.  MiraLAX daily, Senokot nightly.  Lavon Paganini. [place], PA-C 05/14/2020  I have personally performed a face to face diagnostic evaluation of this patient and formulated the key components of the plan.  Additionally, I have personally reviewed laboratory data, imaging studies, as well as relevant notes and concur with the physician assistant's documentation above.   The patient's status has not changed from the original H&P.  Any changes in documentation from the acute care chart have been noted above.     Courtney Heys, MD 05/14/2020

## 2020-05-14 NOTE — Consult Note (Signed)
   [facility] Inpatient Consult   05/14/2020  Johnathan Murray December 21, 1950 [id number]   [facility] [ACO] Patient:   Patient screened for high risk score for unplanned readmission score and for re-hospitalizations to check if potential [facility] Management service needs for transition of care.   Review of patient's medical record reveals patient is to return to inpatient rehab at [facility].  Plan:  Patient is currently for CIR at [facility] and can continue to follow progress and disposition to assess for post hospital care management needs intermittendly.    Please place a [facility] Care Management consult as appropriate and for questions contact:   Natividad Brood, RN BSN [facility] Liaison  [phone] business mobile phone Toll free office [phone]  Fax number: [phone] [email or web address]

## 2020-05-14 NOTE — Progress Notes (Signed)
Pt discharged to CIR. Family at bedside with all patient belongings.

## 2020-05-15 ENCOUNTER — Inpatient Hospital Stay (HOSPITAL_COMMUNITY): Payer: BC Managed Care – PPO | Admitting: Occupational Therapy

## 2020-05-15 ENCOUNTER — Inpatient Hospital Stay (HOSPITAL_COMMUNITY): Payer: BC Managed Care – PPO

## 2020-05-15 DIAGNOSIS — N179 Acute kidney failure, unspecified: Secondary | ICD-10-CM

## 2020-05-15 DIAGNOSIS — B952 Enterococcus as the cause of diseases classified elsewhere: Secondary | ICD-10-CM

## 2020-05-15 DIAGNOSIS — K5903 Drug induced constipation: Secondary | ICD-10-CM

## 2020-05-15 DIAGNOSIS — G8918 Other acute postprocedural pain: Secondary | ICD-10-CM

## 2020-05-15 DIAGNOSIS — E1165 Type 2 diabetes mellitus with hyperglycemia: Secondary | ICD-10-CM

## 2020-05-15 DIAGNOSIS — E46 Unspecified protein-calorie malnutrition: Secondary | ICD-10-CM

## 2020-05-15 DIAGNOSIS — R7881 Bacteremia: Secondary | ICD-10-CM

## 2020-05-15 DIAGNOSIS — I480 Paroxysmal atrial fibrillation: Secondary | ICD-10-CM

## 2020-05-15 DIAGNOSIS — Z794 Long term (current) use of insulin: Secondary | ICD-10-CM

## 2020-05-15 DIAGNOSIS — E088 Diabetes mellitus due to underlying condition with unspecified complications: Secondary | ICD-10-CM

## 2020-05-15 DIAGNOSIS — E8809 Other disorders of plasma-protein metabolism, not elsewhere classified: Secondary | ICD-10-CM

## 2020-05-15 DIAGNOSIS — R7401 Elevation of levels of liver transaminase levels: Secondary | ICD-10-CM

## 2020-05-15 LAB — GLUCOSE, CAPILLARY
Glucose-Capillary: 117 mg/dL — ABNORMAL HIGH (ref 70–99)
Glucose-Capillary: 135 mg/dL — ABNORMAL HIGH (ref 70–99)
Glucose-Capillary: 144 mg/dL — ABNORMAL HIGH (ref 70–99)
Glucose-Capillary: 141 mg/dL — ABNORMAL HIGH (ref 70–99)

## 2020-05-15 LAB — CBC WITH DIFFERENTIAL/PLATELET
Abs Immature Granulocytes: 0.06 10*3/uL (ref 0.00–0.07)
Basophils Absolute: 0.1 10*3/uL (ref 0.0–0.1)
Basophils Relative: 1 %
Eosinophils Absolute: 0.5 10*3/uL (ref 0.0–0.5)
Eosinophils Relative: 6 %
HCT: 27.6 % — ABNORMAL LOW (ref 39.0–52.0)
Hemoglobin: 8.4 g/dL — ABNORMAL LOW (ref 13.0–17.0)
Immature Granulocytes: 1 %
Lymphocytes Relative: 11 %
Lymphs Abs: 1 10*3/uL (ref 0.7–4.0)
MCH: 27.8 pg (ref 26.0–34.0)
MCHC: 30.4 g/dL (ref 30.0–36.0)
MCV: 91.4 fL (ref 80.0–100.0)
Monocytes Absolute: 0.5 10*3/uL (ref 0.1–1.0)
Monocytes Relative: 6 %
Neutro Abs: 7.2 10*3/uL (ref 1.7–7.7)
Neutrophils Relative %: 75 %
Platelets: 417 10*3/uL — ABNORMAL HIGH (ref 150–400)
RBC: 3.02 MIL/uL — ABNORMAL LOW (ref 4.22–5.81)
RDW: 17.3 % — ABNORMAL HIGH (ref 11.5–15.5)
WBC: 9.3 10*3/uL (ref 4.0–10.5)
nRBC: 0 % (ref 0.0–0.2)

## 2020-05-15 LAB — COMPREHENSIVE METABOLIC PANEL
ALT: 61 U/L — ABNORMAL HIGH (ref 0–44)
AST: 34 U/L (ref 15–41)
Albumin: 2 g/dL — ABNORMAL LOW (ref 3.5–5.0)
Alkaline Phosphatase: 104 U/L (ref 38–126)
Anion gap: 9 (ref 5–15)
BUN: 18 mg/dL (ref 8–23)
CO2: 27 mmol/L (ref 22–32)
Calcium: 8.5 mg/dL — ABNORMAL LOW (ref 8.9–10.3)
Chloride: 101 mmol/L (ref 98–111)
Creatinine, Ser: 1.22 mg/dL (ref 0.61–1.24)
GFR calc Af Amer: >60 mL/min (ref >60)
GFR calc non Af Amer: >60 mL/min (ref >60)
Glucose, Bld: 115 mg/dL — ABNORMAL HIGH (ref 70–99)
Potassium: 4.1 mmol/L (ref 3.5–5.1)
Sodium: 137 mmol/L (ref 135–145)
Total Bilirubin: 0.9 mg/dL (ref 0.3–1.2)
Total Protein: 5.3 g/dL — ABNORMAL LOW (ref 6.5–8.1)

## 2020-05-15 LAB — FUNGUS CULTURE RESULT

## 2020-05-15 LAB — FUNGUS CULTURE WITH STAIN

## 2020-05-15 LAB — FUNGAL ORGANISM REFLEX

## 2020-05-15 MED ORDER — CHLORHEXIDINE GLUCONATE CLOTH 2 % EX PADS
6.0000 | MEDICATED_PAD | Freq: Every day | CUTANEOUS | Status: DC
  Administered 2020-05-15 – 2020-05-21 (×7): 6 via TOPICAL

## 2020-05-15 MED ORDER — PRO-STAT SUGAR FREE PO LIQD
30.0000 mL | Freq: Two times a day (BID) | ORAL | Status: DC
  Administered 2020-05-15 – 2020-06-03 (×5): 30 mL via ORAL
  Filled 2020-05-15 (×33): qty 30

## 2020-05-15 NOTE — Progress Notes (Signed)
Occupational Therapy Assessment and Plan  Patient Details  Name: Johnathan Murray MRN: [id number] Date of Birth: 11/25/1950  OT Diagnosis: abnormal posture, muscle weakness (generalized) and pain in thoracic spine Rehab Potential: Rehab Potential (ACUTE ONLY): Good ELOS: 2.5-3 weeks   Today's Date: 05/15/2020 OT Individual Time: 4098-1191 OT Individual Time Calculation (min): 63 min     Hospital Problem: Principal Problem:   Debility Active Problems:   Diabetes mellitus due to underlying condition with unspecified complications ([place])   PAF (paroxysmal atrial fibrillation) (HCC)   S/P AVR   Bacteremia due to Enterococcus   Septic arthritis of knee, bilateral (HCC)   Discitis   Pressure injury of skin   Past Medical History:  Past Medical History:  Diagnosis Date  . Abnormal LFTs (liver function tests) 10/31/2016  . Abnormal nuclear stress test 05/03/2018  . Acid reflux 02/15/2018   not at present  . Acute otitis externa of left ear 11/13/2019  . Acute suppurative otitis media of left ear without spontaneous rupture of tympanic membrane 11/13/2019  . Anemia    low iron  . Aortic stenosis 05/03/2018  . Aortic valve abscess 04/13/2020  . ARF (acute renal failure) ([place]) 03/20/2020  . Arthritis 02/15/2018   Overview:  shoulder  . Bacteremia due to Enterococcus 03/21/2020  . Cervical myelopathy ([place]) 04/22/2019  . Conductive hearing loss of left ear 11/13/2019  . Coronary artery disease involving native coronary artery of native heart without angina pectoris 05/30/2016  . Coronary artery disease of native artery of native heart with stable angina pectoris ([place])    pt. denies  . Diabetes mellitus due to underlying condition with unspecified complications ([place]) 4/78/2956  . Discitis 04/07/2020  . Dyslipidemia 05/30/2016  . Dyspnea    upon exertion,  as of 06/05/09 not having any recent chest pain  . Dysrhythmia    a fib  . Endocarditis 04/13/2020  . Essential hypertension 05/30/2016   . Heart murmur    Has had an Aortic valve replacement  . History of fusion of cervical spine 03/23/2020  . Hyperactive gag reflex 02/15/2018  . Hypothyroidism   . Intermittent claudication ([place]) 10/31/2018  . Lumbar stenosis with neurogenic claudication 06/11/2019  . Macular degeneration 02/15/2018  . PAF (paroxysmal atrial fibrillation) ([place]) 05/30/2016  . Pre-operative cardiovascular examination 01/03/2020  . Preoperative cardiovascular examination 04/17/2019  . Prosthetic valve endocarditis ([place]) 04/13/2020  . Protein-calorie malnutrition, severe 03/24/2020  . Pseudogout of left knee 03/21/2020  . Renal insufficiency 01/17/2020  . S/P aortic valve replacement 01/17/2020  . S/P AVR 01/17/2020  . Septic arthritis ([place]) 03/23/2020  . Tinnitus of left ear 11/13/2019  . Typical atrial flutter [facility])    Past Surgical History:  Past Surgical History:  Procedure Laterality Date  . ANTERIOR CERVICAL DECOMP/DISCECTOMY FUSION N/A 04/22/2019   Procedure: Anterior Cervical Discectomy and Fusion, Cervical four-five, Cervical five-six;  Surgeon: Earnie Larsson, MD;  Location: [place];  Service: Neurosurgery;  Laterality: N/A;  . ANTERIOR CERVICAL DECOMP/DISCECTOMY FUSION N/A 03/02/2020   Procedure: Anterior Cervical Decompression Fusion  - Cervical three-Cervical four;  Surgeon: Earnie Larsson, MD;  Location: [place];  Service: Neurosurgery;  Laterality: N/A;  . AORTIC VALVE REPLACEMENT    . ASCENDING AORTIC ROOT REPLACEMENT N/A 04/16/2020   Procedure: ASCENDING AORTIC ROOT REPLACEMENT - HOMOGRAFT using Royal 23 MM Cryo Aortic Valve.;  Surgeon: Gaye Pollack, MD;  Location: [facility] OR;  Service: Open Heart Surgery;  Laterality: N/A;  . CARDIAC CATHETERIZATION  several stents  . CARDIAC SURGERY    . CARDIOVERSION N/A 04/13/2020   Procedure: CARDIOVERSION;  Surgeon: Geralynn Rile, MD;  Location: [place];  Service: Cardiovascular;  Laterality: N/A;  . CARDIOVERSION N/A 04/29/2020   Procedure:  CARDIOVERSION;  Surgeon: Skeet Latch, MD;  Location: [facility] ENDOSCOPY;  Service: Cardiovascular;  Laterality: N/A;  . CORONARY ARTERY BYPASS GRAFT  2017   patient unsure how many, had at [facility]  . HAND SURGERY    . I & D EXTREMITY Bilateral 05/02/2020   Procedure: IRRIGATION AND DEBRIDEMENT BILATERAL KNEES;  Surgeon: Newt Minion, MD;  Location: [place];  Service: Orthopedics;  Laterality: Bilateral;  . IR LUMBAR DISC ASPIRATION W/IMG GUIDE  04/07/2020  . IRRIGATION AND DEBRIDEMENT KNEE Bilateral 03/24/2020   Procedure: ARTHROSCOPY IRRIGATION AND DEBRIDEMENT BILATERAL  KNEE;  Surgeon: Marchia Bond, MD;  Location: [place];  Service: Orthopedics;  Laterality: Bilateral;  . KNEE SURGERY    . LEFT HEART CATH AND CORONARY ANGIOGRAPHY N/A 01/21/2020   Procedure: LEFT HEART CATH AND CORONARY ANGIOGRAPHY;  Surgeon: Belva Crome, MD;  Location: [place] CV LAB;  Service: Cardiovascular;  Laterality: N/A;  . LEFT HEART CATH AND CORS/GRAFTS ANGIOGRAPHY N/A 05/16/2018   Procedure: LEFT HEART CATH AND CORS/GRAFTS ANGIOGRAPHY;  Surgeon: Burnell Blanks, MD;  Location: [place] CV LAB;  Service: Cardiovascular;  Laterality: N/A;  . LUMBAR LAMINECTOMY/DECOMPRESSION MICRODISCECTOMY Bilateral 06/11/2019   Procedure: Bilateral Lumbar Four-FiveLaminectomy/Foraminotomy;  Surgeon: Earnie Larsson, MD;  Location: [place];  Service: Neurosurgery;  Laterality: Bilateral;  posterior  . SYNOVECTOMY Bilateral 05/02/2020   Procedure: SYNOVECTOMY;  Surgeon: Newt Minion, MD;  Location: [place];  Service: Orthopedics;  Laterality: Bilateral;  . TEE WITHOUT CARDIOVERSION N/A 03/26/2020   Procedure: TRANSESOPHAGEAL ECHOCARDIOGRAM (TEE);  Surgeon: Jerline Pain, MD;  Location: [facility] ENDOSCOPY;  Service: Cardiovascular;  Laterality: N/A;  . TEE WITHOUT CARDIOVERSION N/A 04/13/2020   Procedure: TRANSESOPHAGEAL ECHOCARDIOGRAM (TEE);  Surgeon: Geralynn Rile, MD;  Location: [place];  Service:  Cardiovascular;  Laterality: N/A;  . TEE WITHOUT CARDIOVERSION N/A 04/16/2020   Procedure: TRANSESOPHAGEAL ECHOCARDIOGRAM (TEE);  Surgeon: Gaye Pollack, MD;  Location: [place];  Service: Open Heart Surgery;  Laterality: N/A;  . TEE WITHOUT CARDIOVERSION N/A 04/29/2020   Procedure: TRANSESOPHAGEAL ECHOCARDIOGRAM (TEE);  Surgeon: Skeet Latch, MD;  Location: [facility] ENDOSCOPY;  Service: Cardiovascular;  Laterality: N/A;    Assessment & Plan Clinical Impression: History of Present Illness:Wilmot ICross is a 69 year old right-handed male history of diabetes mellitus, PAF maintained on amiodarone as well as Eliquis, hypertension, bioprosthetic aortic valve replacement 2017, lumbar laminectomy decompression 06/11/2019 as well as recent C3-4 anterior cervical discectomy with interbody fusion for cervical stenosis myelopathy 03/02/2020 per [name]. Patient initially presented 03/19/2020 with low-grade fever and weakness of lower extremities. Lactic acid 1.6, sodium 130, BUN 98, creatinine 6.40 with latest prior creatinine 1.63 on 02/24/2020, WBC 12,600 hemoglobin 10.4 CK of 20, SARS coronavirus negative. His LFTs were markedly elevated. CT of abdomen pelvis well a CT cervical spine unremarkable. MRI cervical spine negative for infection. Patient was hypotensive received IV fluid bolus. Placed on broad-spectrum antibiotics for suspected sepsis. MRI lumbar spine findings consistent with acute osteomyelitis discitis L4-5. Evidence for associated soft tissue infection cellulitis within the psoas musculature bilaterally with a few small superimposed abscesses. Additionally multilevel degenerative spondylosis most pronounced at L3-4. Neurosurgery consulted no plan for surgical intervention he did undergo aspiration of disc space L4-5. TEE showed no obvious vegetation  and follow-up cardiology services. Orthopedic service was consulted Dr. Mardelle Matte for bilateral knee pain with noted swelling of left knee. X-ray  showed diffuse degenerative changes as well as large joint effusion. Doppler showed ruptured Baker's cyst on left lower extremity. Patient underwent left lower extremity joint aspiration 03/21/2020 and right lower extremity joint aspiration 03/22/2020 both showing intracellular calcium pyrophosphate crystals WBC was up to 15,000 culture grew rare E Faecalis. Patient did receive steroid injection both knees. Underwent I&D bilateral knees 03/24/2020. Infectious disease consulted continue to follow maintained on intravenous ampicillin x7 to 8 weeks through 05/20/2020 PICC line was placed 03/29/2020. AKI on CKD stage III continue to improve multifactorial from bacteremia with latest creatinine 1.39. Normocytic anemia postoperative patient transfused 2 units packed red blood cells. He was admitted to inpatient rehab services 04/07/2020 for ongoing comprehensive inpatient rehab services with slow progressive gains. Bouts of tachycardia followed by cardiology services his amiodarone had since been resumed now that his renal function had improved. His Eliquis was ongoing as prior to admission. Patient with persistent bouts of atrial fibrillation with RVR underwent TEE/DCCV 04/29/2020 per cardiology services that showed, bioprosthetic aortic valve well-seated without an perivalvular regurgitation. No LA/LAA thrombus or masses and was initially placed on IV amiodarone transition to p.o. amiodarone as well as low-dose beta-blocker. Ceftriaxone ampicillin ongoing again followed by infectious disease. Therapy evaluations completed and patient was admitted to inpatient rehab services 04/30/2020 for comprehensive rehab therapy. On 05/01/2020 reports increasing left knee pain follow-up orthopedic services aspiration completed of knee demonstrated more than 100,000 white blood cells positive crystals. Fluid was not able to be obtained from right knee and due to these findings suspect bilateral septic knees patient was transferred  again back to acute care services 05/02/2020 undergoing total synovectomy irrigation debridement of bilateral knees application of wound VAC 05/02/2020 per Dr. Sharol Given. Patient with scheduled pain management OxyContin 10 mg every 12 hours as well as Lyrica 100 mg 3 times daily. He again presently continues on ampicillin as well as Rocephin through 05/20/2020. His Eliquis currently remains on hold cardiac rate controlled amiodarone as directed. Patient is againrecommended forinpatient rehab services to continue aggressive inpatient rehab services.  Patient currently requires mod-max with basic self-care skills secondary to muscle weakness and decreased sitting balance, decreased standing balance, decreased postural control and difficulty maintaining precautions.  Prior to hospitalization, patient could complete ADLs and IADLs with independent .  Patient will benefit from skilled intervention to decrease level of assist with basic self-care skills prior to discharge home with care partner.  Anticipate patient will require 24 hour supervision and follow up home health.  OT - End of Session Activity Tolerance: Tolerates 30+ min activity with multiple rests Endurance Deficit: Yes Endurance Deficit Description:  (Pt with back pain and BLE pain impacting mobility/endurance) OT Assessment Rehab Potential (ACUTE ONLY): Good OT Barriers to Discharge: Home environment access/layout (Sternal precautions) OT Barriers to Discharge Comments: 8 STE OT Patient demonstrates impairments in the following area(s): Balance;Endurance;Motor;Pain;Safety OT Basic ADL's Functional Problem(s): Bathing;Dressing;Toileting;Grooming OT Transfers Functional Problem(s): Toilet;Tub/Shower OT Additional Impairment(s): None OT Plan OT Intensity: Minimum of 1-2 x/day, 45 to 90 minutes OT Frequency: 5 out of 7 days OT Duration/Estimated Length of Stay: 2.5-3 weeks OT Treatment/Interventions: [employer] reintegration;Discharge  planning;DME/adaptive equipment instruction;Functional mobility training;Pain management;Patient/family education;Self Care/advanced ADL retraining;Psychosocial support;Therapeutic Activities;Therapeutic Exercise;UE/LE Strength taining/ROM;UE/LE Coordination activities;Skin care/wound managment;Balance/vestibular training OT Basic Self-Care Anticipated Outcome(s): MinA OT Toileting Anticipated Outcome(s): MinA OT Bathroom Transfers Anticipated Outcome(s): MinA OT Recommendation Patient destination: Home Follow  Up Recommendations: Home health OT;24 hour supervision/assistance Equipment Recommended: To be determined  Skilled Therapeutic Intervention  Pt and pt's wife educated on purpose of OT, CIR, ELOS, and POC. Evaluation with focus on ADL retraining, functional transfers, and sit <> stands to increase independence during toileting and LB dressing. Pt received semi-reclined with wife present. Pt sat at EOB with minA. Facilitated max verbal cues throughout mobility during session to adhere to sternal precautions. Attempted sit <> stands with maxA +2, pt completing power up but unable to straighten knees and maintain postural control due to weakness in LE. Modified task to decrease challenge by completing sit <> stands with the [facility] walker, pt stood with maxA +2 fading to minA +2 for static standing with [place] walker. Completed several forward and side steps in Eva walker with max +2 multimodal cues to sequence use of Eva walker and maintain postural control. Facilitated multimodal cues to maintain center of gravity in static standing due to LE weakness. Donned pants at sit to stand level and Eva walker with maxA +2. Provided total assistance to thread pants through BLE and socks due to decreased functional reach and decreased ROM of LE. Washed at EOB with supervision for UB and minA to wash LB due to decreased ROM of LE. Donned hospital gown with minA due to IV placement. Pt reporting unrated pain when sitting  at EOB, facilitated rest breaks and repositioned. Ended session with pt semi-reclined in bed with wife present, bed alarm on and all needs within reach.  OT Evaluation Precautions/Restrictions  Precautions Precautions: Sternal;Fall Precaution Comments: freq cues to adhere to sternal prec Required Braces or Orthoses: Spinal Brace Spinal Brace: Lumbar corset Other Brace: for comfort, pt reports "it doesn't really help me" so we didn't use back brace Restrictions Weight Bearing Restrictions: No Other Position/Activity Restrictions: sternal precautions General   Vital Signs Therapy Vitals Temp: 99 F (37.2 C) Temp Source: Oral Pulse Rate: 82 Resp: 18 BP: 122/80 Patient Position (if appropriate): Lying Oxygen Therapy SpO2: 99 % O2 Device: Room Air Pain Pain Assessment Pain Scale: 0-10 Pain Score: 0-No pain (0 pain when seated, reported unrated pain in back after sitting at EOB and completing sit <> stands) Pain Intervention(s): Medication (See eMAR) Home Living/Prior Functioning Home Living Available Help at Discharge: Family, Available 24 hours/day (wife will need to return to work after 2-3 weeks) Type of Home: House Home Access: Stairs to enter Technical brewer of Steps: 8 Entrance Stairs-Rails: Right, Left Home Layout: One level Bathroom Shower/Tub: Multimedia programmer: Handicapped height Bathroom Accessibility: Yes Additional Comments: Grab bars in shower with built in seat, no grab bars near toilet  Lives With: Spouse IADL History Homemaking Responsibilities: Yes Meal Prep Responsibility: Secondary Laundry Responsibility: Secondary Cleaning Responsibility: Secondary Bill Paying/Finance Responsibility: Secondary Shopping Responsibility: Secondary Child Care Responsibility: Secondary Current License: Yes Mode of Transportation: Car IADL Comments: Wife was responsible for IADLs, but pt enjoys grocery shopping Prior Function Level of  Independence: Independent with basic ADLs, Requires assistive device for independence, Independent with transfers  Able to Take Stairs?: Yes Driving: Yes Comments: independent in ADL and IADL prior to recent admission, enjoys shopping around town ADL ADL Grooming: Supervision/safety Where Assessed-Grooming: Edge of bed Upper Body Bathing: Supervision/safety Where Assessed-Upper Body Bathing: Edge of bed Lower Body Bathing: Moderate assistance (+2 for sit > stand) Where Assessed-Lower Body Bathing: Edge of bed Upper Body Dressing: Minimal assistance (hospital gown due to PICC line) Where Assessed-Upper Body Dressing: Edge of bed Lower Body  Dressing: Dependent (Mod A +2 for sit > stand) Where Assessed-Lower Body Dressing: Edge of bed (sit to stand level) Vision Baseline Vision/History: Wears glasses Wears Glasses: Reading only Patient Visual Report: No change from baseline Vision Assessment?: No apparent visual deficits Perception  Perception: Within Functional Limits Praxis Praxis: Intact Cognition Overall Cognitive Status: Within Functional Limits for tasks assessed Arousal/Alertness: Awake/alert Orientation Level: Person;Place;Situation Person: Oriented Place: Oriented Situation: Oriented Year: 2021 Month: June Day of Week: Correct Memory: Appears intact Immediate Memory Recall: Sock;Blue;Bed Memory Recall Sock: Without Cue Memory Recall Blue: Without Cue Memory Recall Bed: Without Cue Attention: Focused;Sustained;Selective Focused Attention: Appears intact Sustained Attention: Appears intact Awareness: Appears intact Problem Solving: Appears intact Safety/Judgment: Appears intact Sensation Sensation Light Touch: Appears Intact (in BUE) Proprioception: Appears Intact (BUE) Coordination Gross Motor Movements are Fluid and Coordinated: No Fine Motor Movements are Fluid and Coordinated: No Coordination and Movement Description: Gross motor movements impared due to  pain and LE weakness, mild decreased FMC (particuarly 2nd digit of LUE) Finger Nose Finger Test: [facility]   Extremity/Trunk Assessment RUE Assessment RUE Assessment: Within Functional Limits General Strength Comments: 5/5 grossly LUE Assessment LUE Assessment: Within Functional Limits General Strength Comments: 5/5 grossly     Refer to Care Plan for Long Term Goals  Recommendations for other services: None    Discharge Criteria: Patient will be discharged from OT if patient refuses treatment 3 consecutive times without medical reason, if treatment goals not met, if there is a change in medical status, if patient makes no progress towards goals or if patient is discharged from hospital.  The above assessment, treatment plan, treatment alternatives and goals were discussed and mutually agreed upon: by patient  Michelle Nasuti 05/15/2020, 7:26 AM

## 2020-05-15 NOTE — Plan of Care (Signed)
  Problem: Consults Goal: RH GENERAL PATIENT EDUCATION Description: See Patient Education module for education specifics. Outcome: Progressing Goal: Skin Care Protocol Initiated - if Braden Score 18 or less Description: If consults are not indicated, leave blank or document N/A Outcome: Progressing   Problem: RH BOWEL ELIMINATION Goal: RH STG MANAGE BOWEL WITH ASSISTANCE Description: STG Manage Bowel with min Assistance. Outcome: Progressing Goal: RH STG MANAGE BOWEL W/MEDICATION W/ASSISTANCE Description: STG Manage Bowel with Medication with min Assistance. Outcome: Progressing   Problem: RH SKIN INTEGRITY Goal: RH STG MAINTAIN SKIN INTEGRITY WITH ASSISTANCE Description: STG Maintain Skin Integrity With min Assistance. Outcome: Progressing Goal: RH STG ABLE TO PERFORM INCISION/WOUND CARE W/ASSISTANCE Description: STG Able To Perform Incision/Wound Care With min Assistance. Outcome: Progressing   Problem: RH SAFETY Goal: RH STG ADHERE TO SAFETY PRECAUTIONS W/ASSISTANCE/DEVICE Description: STG Adhere to Safety Precautions With cues/reminders Assistance/Device. Outcome: Progressing   Problem: RH PAIN MANAGEMENT Goal: RH STG PAIN MANAGED AT OR BELOW PT'S PAIN GOAL Description: Less than 4  Outcome: Progressing

## 2020-05-15 NOTE — Discharge Instructions (Addendum)
Inpatient Rehab Discharge Instructions  [facility] Discharge date and time: No discharge date for patient encounter.   Activities/Precautions/ Functional Status: Activity: Sternal precautions Diet: diabetic diet Wound Care: keep wound clean and dry Functional status:  ___ No restrictions     ___ Walk up steps independently ___ 24/7 supervision/assistance   ___ Walk up steps with assistance ___ Intermittent supervision/assistance  ___ Bathe/dress independently ___ Walk with walker     _x__ Bathe/dress with assistance ___ Walk Independently    ___ Shower independently ___ Walk with assistance    ___ Shower with assistance ___ No alcohol     ___ Return to work/school ________ COMMUNITY REFERRALS UPON DISCHARGE:     Outpatient: PT             Agency: [facility]  Phone: [phone]              Appointment Date/Time: Facility to Determine At Discharge  Medical Equipment/Items Ordered: Bedside Commode and Tub Transfer Bench                                                 Agency/Supplier: [employer]  Special Instructions: No driving smoking or alcohol   My questions have been answered and I understand these instructions. I will adhere to these goals and the provided educational materials after my discharge from the hospital.  Patient/Caregiver Signature _______________________________ Date __________  Clinician Signature _______________________________________ Date __________  Please bring this form and your medication list with you to all your follow-up doctor's appointments.     Information on my medicine - ELIQUIS (apixaban)  This medication education was reviewed with me or my healthcare representative as part of my discharge preparation.    Why was Eliquis prescribed for you? Eliquis was prescribed for you to reduce the risk of a blood clot forming that can cause a stroke if you have a medical condition  called atrial fibrillation (a type of irregular heartbeat).  What do You need to know about Eliquis ? Take your Eliquis TWICE DAILY - one tablet in the morning and one tablet in the evening with or without food. If you have difficulty swallowing the tablet whole please discuss with your pharmacist how to take the medication safely.  Take Eliquis exactly as prescribed by your doctor and DO NOT stop taking Eliquis without talking to the doctor who prescribed the medication.  Stopping may increase your risk of developing a stroke.  Refill your prescription before you run out.  After discharge, you should have regular check-up appointments with your healthcare provider that is prescribing your Eliquis.  In the future your dose may need to be changed if your kidney function or weight changes by a significant amount or as you get older.  What do you do if you miss a dose? If you miss a dose, take it as soon as you remember on the same day and resume taking twice daily.  Do not take more than one dose of ELIQUIS at the same time to make up a missed dose.  Important Safety Information A possible side effect of Eliquis is bleeding. You should call your healthcare provider right away if you experience any of the following: ? Bleeding from an injury or your nose that does not stop. ? Unusual colored urine (red or dark  brown) or unusual colored stools (red or black). ? Unusual bruising for unknown reasons. ? A serious fall or if you hit your head (even if there is no bleeding).  Some medicines may interact with Eliquis and might increase your risk of bleeding or clotting while on Eliquis. To help avoid this, consult your healthcare provider or pharmacist prior to using any new prescription or non-prescription medications, including herbals, vitamins, non-steroidal anti-inflammatory drugs (NSAIDs) and supplements.  This website has more information on Eliquis (apixaban):  http://www.eliquis.com/eliquis/home

## 2020-05-15 NOTE — Progress Notes (Signed)
Inpatient Rehabilitation  Patient information reviewed and entered into eRehab system by Maico Mulvehill M. Kearney Evitt, M.A., CCC/SLP, PPS Coordinator.  Information including medical coding, functional ability and quality indicators will be reviewed and updated through discharge.    

## 2020-05-15 NOTE — Progress Notes (Signed)
Occupational Therapy Session Note  Patient Details  Name: Johnathan Murray MRN: [id number] Date of Birth: 05-02-1951  Today's Date: 05/15/2020 OT Individual Time: 1255-1330 OT Individual Time Calculation (min): 35 min   Skilled Therapeutic Interventions/Progress Updates:    Pt greeted in bed and premedicated for pain. Spouse [name] present. Pt was agreeable to problem solve BSC transfers during session. Reviewed his sternal precautions and back precautions prior to functional movement with pt exhibiting a good understanding. Supine<sit completed with Max A using logroll technique with vcs. Educated pt on a method for him to don LSO while adhering to his precautions with Min A and vcs. Started with slideboard transfer training, pt able to complete slideboard transfer to drop arm BSC with Min A and vcs. Discussed adaptive methods for completing perihygiene and clothing mgt. He wanted to try to transfer using the RW back to bed, did so with Max A of 1 to power up, and +2 assist for pivotting back to bed. Pt with increased anxiety/FOF when standing. Pt then completed lateral scoots up towards [name] with vcs, did well with sternal precaution adherence at this time. +2 for sit<supine and for boosting up in bed once his LSO was doffed. Pt remained comfortably in bed at close of session, spouse still present. All needs within reach and bed alarm set.   OT also tried to find an elongated drop arm BSC during tx but none were in stock. He is requesting one of these BSCs for the room.   Therapy Documentation Precautions:  Precautions Precautions: Sternal, Fall Precaution Comments: freq cues to adhere to sternal prec Required Braces or Orthoses: Spinal Brace Spinal Brace: Lumbar corset Other Brace: for comfort, pt reports "it doesn't really help me" so we didn't use back brace Restrictions Weight Bearing Restrictions: No Other Position/Activity Restrictions: sternal precautions Vital Signs: Therapy  Vitals Temp: 98.1 F (36.7 C) Pulse Rate: 91 Resp: 18 BP: 111/75 Patient Position (if appropriate): Lying Oxygen Therapy SpO2: 100 % O2 Device: Room Air Pain: Pain Assessment Pain Scale: 0-10 Pain Score: 5  Pain Type: Surgical pain Pain Location: Knee Pain Orientation: Right;Left Pain Descriptors / Indicators: Aching;Sharp (sharp w/end rom) ADL: ADL Grooming: Supervision/safety Where Assessed-Grooming: Edge of bed Upper Body Bathing: Supervision/safety Where Assessed-Upper Body Bathing: Edge of bed Lower Body Bathing: Moderate assistance (+2 for sit > stand) Where Assessed-Lower Body Bathing: Edge of bed Upper Body Dressing: Minimal assistance (hospital gown due to PICC line) Where Assessed-Upper Body Dressing: Edge of bed Lower Body Dressing: Dependent (Mod A +2 for sit > stand) Where Assessed-Lower Body Dressing: Edge of bed (sit to stand level)     Therapy/Group: Individual Therapy  Wreatha Sturgeon A Roper Tolson 05/15/2020, 3:48 PM

## 2020-05-15 NOTE — Progress Notes (Signed)
Inpatient Rehabilitation Care Coordinator Assessment and Plan  Patient Details  Name: Johnathan Murray MRN: [id number] Date of Birth: [birth year]  Today's Date: 05/15/2020  Problem List:  Patient Active Problem List   Diagnosis Date Noted  . Postoperative pain   . Transaminitis   . Hypoalbuminemia due to protein-calorie malnutrition ([place])   . Drug induced constipation   . Controlled type 2 diabetes mellitus with hyperglycemia, with long-term current use of insulin ([place])   . AKI (acute kidney injury) ([place])   . Pressure injury of skin 05/14/2020  . Debility 05/14/2020  . Lumbar discitis 05/04/2020  . Acute blood loss anemia 05/02/2020  . Prolonged QT interval 05/02/2020  . Transient hypotension 05/02/2020  . Persistent atrial fibrillation ([place])   . Prosthetic valve endocarditis ([place]) 04/13/2020  . Aortic valve abscess 04/13/2020  . Endocarditis 04/13/2020  . Typical atrial flutter ([place])   . Discitis 04/07/2020  . Protein-calorie malnutrition, severe 03/24/2020  . Septic arthritis of knee, bilateral ([place]) 03/23/2020  . History of fusion of cervical spine 03/23/2020  . Bacteremia due to Enterococcus 03/21/2020  . Pseudogout of left knee 03/21/2020  . ARF (acute renal failure) ([place]) 03/20/2020  . S/P AVR 01/17/2020  . Renal insufficiency 01/17/2020  . Pre-operative cardiovascular examination 01/03/2020  . Acute otitis externa of left ear 11/13/2019  . Acute suppurative otitis media of left ear without spontaneous rupture of tympanic membrane 11/13/2019  . Conductive hearing loss of left ear 11/13/2019  . Tinnitus of left ear 11/13/2019  . Lumbar stenosis with neurogenic claudication 06/11/2019  . Cervical myelopathy ([place]) 04/22/2019  . Preoperative cardiovascular examination 04/17/2019  . Intermittent claudication ([place]) 10/31/2018  . Coronary artery disease of native artery of native heart with stable angina pectoris ([place])   . Abnormal nuclear stress test 05/03/2018  .  Aortic stenosis 05/03/2018  . Acid reflux 02/15/2018  . Arthritis 02/15/2018  . Hyperactive gag reflex 02/15/2018  . Macular degeneration 02/15/2018  . Abnormal LFTs (liver function tests) 10/31/2016  . Coronary artery disease involving native coronary artery of native heart without angina pectoris 05/30/2016  . Diabetes mellitus due to underlying condition with unspecified complications ([place]) 40/08/2724  . Dyslipidemia 05/30/2016  . Essential hypertension 05/30/2016  . PAF (paroxysmal atrial fibrillation) ([place]) 05/30/2016   Past Medical History:  Past Medical History:  Diagnosis Date  . Abnormal LFTs (liver function tests) 10/31/2016  . Abnormal nuclear stress test 05/03/2018  . Acid reflux 02/15/2018   not at present  . Acute otitis externa of left ear 11/13/2019  . Acute suppurative otitis media of left ear without spontaneous rupture of tympanic membrane 11/13/2019  . Anemia    low iron  . Aortic stenosis 05/03/2018  . Aortic valve abscess 04/13/2020  . ARF (acute renal failure) ([place]) 03/20/2020  . Arthritis 02/15/2018   Overview:  shoulder  . Bacteremia due to Enterococcus 03/21/2020  . Cervical myelopathy ([place]) 04/22/2019  . Conductive hearing loss of left ear 11/13/2019  . Coronary artery disease involving native coronary artery of native heart without angina pectoris 05/30/2016  . Coronary artery disease of native artery of native heart with stable angina pectoris ([place])    pt. denies  . Diabetes mellitus due to underlying condition with unspecified complications ([place]) 3/66/4403  . Discitis 04/07/2020  . Dyslipidemia 05/30/2016  . Dyspnea    upon exertion,  as of 06/05/09 not having any recent chest pain  . Dysrhythmia    a fib  . Endocarditis 04/13/2020  . Essential  hypertension 05/30/2016  . Heart murmur    Has had an Aortic valve replacement  . History of fusion of cervical spine 03/23/2020  . Hyperactive gag reflex 02/15/2018  . Hypothyroidism   . Intermittent claudication  ([place]) 10/31/2018  . Lumbar stenosis with neurogenic claudication 06/11/2019  . Macular degeneration 02/15/2018  . PAF (paroxysmal atrial fibrillation) ([place]) 05/30/2016  . Pre-operative cardiovascular examination 01/03/2020  . Preoperative cardiovascular examination 04/17/2019  . Prosthetic valve endocarditis ([place]) 04/13/2020  . Protein-calorie malnutrition, severe 03/24/2020  . Pseudogout of left knee 03/21/2020  . Renal insufficiency 01/17/2020  . S/P aortic valve replacement 01/17/2020  . S/P AVR 01/17/2020  . Septic arthritis ([place]) 03/23/2020  . Tinnitus of left ear 11/13/2019  . Typical atrial flutter [facility])    Past Surgical History:  Past Surgical History:  Procedure Laterality Date  . ANTERIOR CERVICAL DECOMP/DISCECTOMY FUSION N/A 04/22/2019   Procedure: Anterior Cervical Discectomy and Fusion, Cervical four-five, Cervical five-six;  Surgeon: Earnie Larsson, MD;  Location: [place];  Service: Neurosurgery;  Laterality: N/A;  . ANTERIOR CERVICAL DECOMP/DISCECTOMY FUSION N/A 03/02/2020   Procedure: Anterior Cervical Decompression Fusion  - Cervical three-Cervical four;  Surgeon: Earnie Larsson, MD;  Location: [place];  Service: Neurosurgery;  Laterality: N/A;  . AORTIC VALVE REPLACEMENT    . ASCENDING AORTIC ROOT REPLACEMENT N/A 04/16/2020   Procedure: ASCENDING AORTIC ROOT REPLACEMENT - HOMOGRAFT using [place] 23 MM Cryo Aortic Valve.;  Surgeon: Gaye Pollack, MD;  Location: [facility] OR;  Service: Open Heart Surgery;  Laterality: N/A;  . CARDIAC CATHETERIZATION     several stents  . CARDIAC SURGERY    . CARDIOVERSION N/A 04/13/2020   Procedure: CARDIOVERSION;  Surgeon: Geralynn Rile, MD;  Location: [place];  Service: Cardiovascular;  Laterality: N/A;  . CARDIOVERSION N/A 04/29/2020   Procedure: CARDIOVERSION;  Surgeon: Skeet Latch, MD;  Location: [facility] ENDOSCOPY;  Service: Cardiovascular;  Laterality: N/A;  . CORONARY ARTERY BYPASS GRAFT  2017   patient unsure how many, had at [facility]  . HAND SURGERY    . I & D EXTREMITY Bilateral 05/02/2020   Procedure: IRRIGATION AND DEBRIDEMENT BILATERAL KNEES;  Surgeon: Newt Minion, MD;  Location: [place];  Service: Orthopedics;  Laterality: Bilateral;  . IR LUMBAR DISC ASPIRATION W/IMG GUIDE  04/07/2020  . IRRIGATION AND DEBRIDEMENT KNEE Bilateral 03/24/2020   Procedure: ARTHROSCOPY IRRIGATION AND DEBRIDEMENT BILATERAL  KNEE;  Surgeon: Marchia Bond, MD;  Location: [place];  Service: Orthopedics;  Laterality: Bilateral;  . KNEE SURGERY    . LEFT HEART CATH AND CORONARY ANGIOGRAPHY N/A 01/21/2020   Procedure: LEFT HEART CATH AND CORONARY ANGIOGRAPHY;  Surgeon: Belva Crome, MD;  Location: [place] CV LAB;  Service: Cardiovascular;  Laterality: N/A;  . LEFT HEART CATH AND CORS/GRAFTS ANGIOGRAPHY N/A 05/16/2018   Procedure: LEFT HEART CATH AND CORS/GRAFTS ANGIOGRAPHY;  Surgeon: Burnell Blanks, MD;  Location: [place] CV LAB;  Service: Cardiovascular;  Laterality: N/A;  . LUMBAR LAMINECTOMY/DECOMPRESSION MICRODISCECTOMY Bilateral 06/11/2019   Procedure: Bilateral Lumbar Four-FiveLaminectomy/Foraminotomy;  Surgeon: Earnie Larsson, MD;  Location: [place];  Service: Neurosurgery;  Laterality: Bilateral;  posterior  . SYNOVECTOMY Bilateral 05/02/2020   Procedure: SYNOVECTOMY;  Surgeon: Newt Minion, MD;  Location: [place];  Service: Orthopedics;  Laterality: Bilateral;  . TEE WITHOUT CARDIOVERSION N/A 03/26/2020   Procedure: TRANSESOPHAGEAL ECHOCARDIOGRAM (TEE);  Surgeon: Jerline Pain, MD;  Location: [facility] ENDOSCOPY;  Service: Cardiovascular;  Laterality: N/A;  . TEE WITHOUT CARDIOVERSION  N/A 04/13/2020   Procedure: TRANSESOPHAGEAL ECHOCARDIOGRAM (TEE);  Surgeon: Geralynn Rile, MD;  Location: [place];  Service: Cardiovascular;  Laterality: N/A;  . TEE WITHOUT CARDIOVERSION N/A 04/16/2020   Procedure: TRANSESOPHAGEAL ECHOCARDIOGRAM (TEE);  Surgeon: Gaye Pollack, MD;  Location: [place];  Service: Open Heart Surgery;   Laterality: N/A;  . TEE WITHOUT CARDIOVERSION N/A 04/29/2020   Procedure: TRANSESOPHAGEAL ECHOCARDIOGRAM (TEE);  Surgeon: Skeet Latch, MD;  Location: [place];  Service: Cardiovascular;  Laterality: N/A;   Social History:  reports that he has never smoked. He has never used smokeless tobacco. He reports previous alcohol use. He reports that he does not use drugs.  Family / Support Systems Patient Roles: Spouse Spouse/Significant Other: Education officer, museum Children: 1 child Anticipated Caregiver: spouse ( beverely) Ability/Limitations of Caregiver: no lifting, (works 6:30- 3:00/400) Caregiver Availability: Intermittent  Social History Preferred language: English Religion: Christian Cultural Background: Truck Geophysicist/field seismologist Read: Yes Write: Yes   Abuse/Neglect Abuse/Neglect Assessment Can Be Completed: Yes Physical Abuse: Denies Verbal Abuse: Denies Sexual Abuse: Denies Exploitation of patient/patient's resources: Denies Self-Neglect: Denies  Emotional Status Pt's affect, behavior and adjustment status: Initially moody, more encouraged now Recent Psychosocial Issues: no Psychiatric History: no Substance Abuse History: no  Patient / Family Perceptions, Expectations & Goals Pt/Family understanding of illness & functional limitations: yes Pt/family expectations/goals: Goal to dischrage home  [employer]: None Premorbid Home Care/DME Agencies: None Transportation available at discharge: Spouse able to transport  Discharge Planning Living Arrangements: Spouse/significant other Support Systems: Spouse/significant other, Children Type of Residence: Private residence (1 Level Home, 8 steps to enter, railings. Step in shower) Insurance Resources: Multimedia programmer (specify) Nurse, mental health) Care Coordinator Barriers to Discharge: Lack of/limited family support, Inaccessible home environment Care Coordinator Barriers to Discharge Comments: Spouse works during day  8 steps  to enter home Care Coordinator Anticipated Follow Up Needs: HH/OP Expected length of stay: 16-19 Days  Clinical Impression Sw entered room. Spouse at bedside. Sw introduced self, explained role and process. SW will contiue to follow up with questions and concerns.   [name] 05/15/2020, 12:45 PM

## 2020-05-15 NOTE — Evaluation (Signed)
Physical Therapy Assessment and Plan  Patient Details  Name: Johnathan Murray MRN: [id number] Date of Birth: [birth year]  PT Diagnosis: Abnormality of gait, Contracture of joint: knee, Difficulty walking, Edema, Muscle weakness and Pain in joint Rehab Potential: Good ELOS: 2.5-3 weeks   Today's Date: 05/15/2020 PT Individual Time: 0900-1000 PT Individual Time Calculation (min): 60 min    Hospital Problem: Principal Problem:   Debility Active Problems:   Diabetes mellitus due to underlying condition with unspecified complications ([place])   PAF (paroxysmal atrial fibrillation) ([place])   S/P AVR   Bacteremia due to Enterococcus   Septic arthritis of knee, bilateral ([place])   Discitis   Pressure injury of skin   Postoperative pain   Transaminitis   Hypoalbuminemia due to protein-calorie malnutrition ([place])   Drug induced constipation   Controlled type 2 diabetes mellitus with hyperglycemia, with long-term current use of insulin (HCC)   AKI (acute kidney injury) ([place])   Past Medical History:  Past Medical History:  Diagnosis Date  . Abnormal LFTs (liver function tests) 10/31/2016  . Abnormal nuclear stress test 05/03/2018  . Acid reflux 02/15/2018   not at present  . Acute otitis externa of left ear 11/13/2019  . Acute suppurative otitis media of left ear without spontaneous rupture of tympanic membrane 11/13/2019  . Anemia    low iron  . Aortic stenosis 05/03/2018  . Aortic valve abscess 04/13/2020  . ARF (acute renal failure) ([place]) 03/20/2020  . Arthritis 02/15/2018   Overview:  shoulder  . Bacteremia due to Enterococcus 03/21/2020  . Cervical myelopathy ([place]) 04/22/2019  . Conductive hearing loss of left ear 11/13/2019  . Coronary artery disease involving native coronary artery of native heart without angina pectoris 05/30/2016  . Coronary artery disease of native artery of native heart with stable angina pectoris ([place])    pt. denies  . Diabetes mellitus due to underlying condition  with unspecified complications ([place]) 6/62/9476  . Discitis 04/07/2020  . Dyslipidemia 05/30/2016  . Dyspnea    upon exertion,  as of 06/05/09 not having any recent chest pain  . Dysrhythmia    a fib  . Endocarditis 04/13/2020  . Essential hypertension 05/30/2016  . Heart murmur    Has had an Aortic valve replacement  . History of fusion of cervical spine 03/23/2020  . Hyperactive gag reflex 02/15/2018  . Hypothyroidism   . Intermittent claudication ([place]) 10/31/2018  . Lumbar stenosis with neurogenic claudication 06/11/2019  . Macular degeneration 02/15/2018  . PAF (paroxysmal atrial fibrillation) ([place]) 05/30/2016  . Pre-operative cardiovascular examination 01/03/2020  . Preoperative cardiovascular examination 04/17/2019  . Prosthetic valve endocarditis ([place]) 04/13/2020  . Protein-calorie malnutrition, severe 03/24/2020  . Pseudogout of left knee 03/21/2020  . Renal insufficiency 01/17/2020  . S/P aortic valve replacement 01/17/2020  . S/P AVR 01/17/2020  . Septic arthritis ([place]) 03/23/2020  . Tinnitus of left ear 11/13/2019  . Typical atrial flutter [facility])    Past Surgical History:  Past Surgical History:  Procedure Laterality Date  . ANTERIOR CERVICAL DECOMP/DISCECTOMY FUSION N/A 04/22/2019   Procedure: Anterior Cervical Discectomy and Fusion, Cervical four-five, Cervical five-six;  Surgeon: Earnie Larsson, MD;  Location: [facility] [place];  Service: Neurosurgery;  Laterality: N/A;  . ANTERIOR CERVICAL DECOMP/DISCECTOMY FUSION N/A 03/02/2020   Procedure: Anterior Cervical Decompression Fusion  - Cervical three-Cervical four;  Surgeon: Earnie Larsson, MD;  Location: [place];  Service: Neurosurgery;  Laterality: N/A;  . AORTIC VALVE REPLACEMENT    . ASCENDING AORTIC ROOT REPLACEMENT N/A  04/16/2020   Procedure: ASCENDING AORTIC ROOT REPLACEMENT - HOMOGRAFT using [place] 23 MM Cryo Aortic Valve.;  Surgeon: Gaye Pollack, MD;  Location: [place] OR;  Service: Open Heart Surgery;  Laterality: N/A;  . CARDIAC CATHETERIZATION      several stents  . CARDIAC SURGERY    . CARDIOVERSION N/A 04/13/2020   Procedure: CARDIOVERSION;  Surgeon: Geralynn Rile, MD;  Location: [place];  Service: Cardiovascular;  Laterality: N/A;  . CARDIOVERSION N/A 04/29/2020   Procedure: CARDIOVERSION;  Surgeon: Skeet Latch, MD;  Location: [facility] ENDOSCOPY;  Service: Cardiovascular;  Laterality: N/A;  . CORONARY ARTERY BYPASS GRAFT  2017   patient unsure how many, had at [facility]  . HAND SURGERY    . I & D EXTREMITY Bilateral 05/02/2020   Procedure: IRRIGATION AND DEBRIDEMENT BILATERAL KNEES;  Surgeon: Newt Minion, MD;  Location: [place];  Service: Orthopedics;  Laterality: Bilateral;  . IR LUMBAR DISC ASPIRATION W/IMG GUIDE  04/07/2020  . IRRIGATION AND DEBRIDEMENT KNEE Bilateral 03/24/2020   Procedure: ARTHROSCOPY IRRIGATION AND DEBRIDEMENT BILATERAL  KNEE;  Surgeon: Marchia Bond, MD;  Location: [place];  Service: Orthopedics;  Laterality: Bilateral;  . KNEE SURGERY    . LEFT HEART CATH AND CORONARY ANGIOGRAPHY N/A 01/21/2020   Procedure: LEFT HEART CATH AND CORONARY ANGIOGRAPHY;  Surgeon: Belva Crome, MD;  Location: [place] CV LAB;  Service: Cardiovascular;  Laterality: N/A;  . LEFT HEART CATH AND CORS/GRAFTS ANGIOGRAPHY N/A 05/16/2018   Procedure: LEFT HEART CATH AND CORS/GRAFTS ANGIOGRAPHY;  Surgeon: Burnell Blanks, MD;  Location: [place] CV LAB;  Service: Cardiovascular;  Laterality: N/A;  . LUMBAR LAMINECTOMY/DECOMPRESSION MICRODISCECTOMY Bilateral 06/11/2019   Procedure: Bilateral Lumbar Four-FiveLaminectomy/Foraminotomy;  Surgeon: Earnie Larsson, MD;  Location: [place];  Service: Neurosurgery;  Laterality: Bilateral;  posterior  . SYNOVECTOMY Bilateral 05/02/2020   Procedure: SYNOVECTOMY;  Surgeon: Newt Minion, MD;  Location: [place];  Service: Orthopedics;  Laterality: Bilateral;  . TEE WITHOUT CARDIOVERSION N/A 03/26/2020   Procedure: TRANSESOPHAGEAL ECHOCARDIOGRAM (TEE);  Surgeon: Jerline Pain,  MD;  Location: [facility] ENDOSCOPY;  Service: Cardiovascular;  Laterality: N/A;  . TEE WITHOUT CARDIOVERSION N/A 04/13/2020   Procedure: TRANSESOPHAGEAL ECHOCARDIOGRAM (TEE);  Surgeon: Geralynn Rile, MD;  Location: [place];  Service: Cardiovascular;  Laterality: N/A;  . TEE WITHOUT CARDIOVERSION N/A 04/16/2020   Procedure: TRANSESOPHAGEAL ECHOCARDIOGRAM (TEE);  Surgeon: Gaye Pollack, MD;  Location: [place];  Service: Open Heart Surgery;  Laterality: N/A;  . TEE WITHOUT CARDIOVERSION N/A 04/29/2020   Procedure: TRANSESOPHAGEAL ECHOCARDIOGRAM (TEE);  Surgeon: Skeet Latch, MD;  Location: [place];  Service: Cardiovascular;  Laterality: N/A;    Assessment & Plan Clinical Impression: Johnathan Murray is a 69 year old right-handed male history of diabetes mellitus, PAF maintained on amiodarone as well as Eliquis, hypertension, bioprosthetic aortic valve replacement 2017, lumbar laminectomy decompression 06/11/2019 as well as recent C3-4 anterior cervical discectomy with interbody fusion for cervical stenosis myelopathy 03/02/2020 per [name].  Per chart review lives with spouse 1 level home 7 steps to entry.  Use a rolling walker prior to admission.  Wife works during the day.  Patient initially presented 03/19/2020 with low-grade fever and weakness of lower extremities.  Lactic acid 1.6, sodium 130, BUN 98, creatinine 6.40 with latest prior creatinine 1.63 on 02/24/2020, WBC 12,600 hemoglobin 10.4 CK of 20, SARS coronavirus negative.  His LFTs were markedly elevated.  CT of abdomen pelvis well a CT cervical spine unremarkable.  MRI  cervical spine negative for infection.  Patient was hypotensive received IV fluid bolus.  Placed on broad-spectrum antibiotics for suspected sepsis.  MRI lumbar spine findings consistent with acute osteomyelitis discitis L4-5.  Evidence for associated soft tissue infection cellulitis within the psoas musculature bilaterally with a few small superimposed abscesses.   Additionally multilevel degenerative spondylosis most pronounced at L3-4.  Neurosurgery consulted no plan for surgical intervention he did undergo aspiration of disc space L4-5.  TEE showed no obvious vegetation and follow-up cardiology services.  Orthopedic service was consulted Dr. Mardelle Matte for bilateral knee pain with noted swelling of left knee.  X-ray showed diffuse degenerative changes as well as large joint effusion.  Doppler showed ruptured Baker's cyst on left lower extremity.  Patient underwent left lower extremity joint aspiration 03/21/2020 and right lower extremity joint aspiration 03/22/2020 both showing intracellular calcium pyrophosphate crystals WBC was up to 15,000 culture grew rare E Faecalis.  Patient did receive steroid injection both knees.  Underwent I&D bilateral knees 03/24/2020.  Infectious disease consulted continue to follow maintained on intravenous ampicillin x7 to 8 weeks through 05/20/2020 PICC line was placed 03/29/2020.  AKI on CKD stage III continue to improve multifactorial from bacteremia with latest creatinine 1.39.  Normocytic anemia postoperative patient transfused 2 units packed red blood cells.  He was admitted to inpatient rehab services 04/07/2020 for ongoing comprehensive inpatient rehab services with slow progressive gains.  Bouts of tachycardia followed by cardiology services his amiodarone had since been resumed now that his renal function had improved.  His Eliquis was ongoing as prior to admission.  Patient with persistent bouts of atrial fibrillation with RVR underwent TEE/DCCV 04/29/2020 per cardiology services that showed, bioprosthetic aortic valve well-seated without an perivalvular regurgitation.  No LA/LAA thrombus or masses and was initially placed on IV amiodarone transition to p.o. amiodarone as well as low-dose beta-blocker.  Ceftriaxone ampicillin ongoing again followed by infectious disease.  Therapy evaluations completed and patient was admitted to inpatient rehab  services 04/30/2020 for comprehensive rehab therapy.  On 05/01/2020 reports increasing left knee pain follow-up orthopedic services aspiration completed of knee demonstrated more than 100,000 white blood cells positive crystals.  Fluid was not able to be obtained from right knee and due to these findings suspect bilateral septic knees patient was transferred again back to acute care services 05/02/2020 undergoing total synovectomy irrigation debridement of bilateral knees application of wound VAC 05/02/2020 per Dr. Sharol Given that have since been discontinued.  Patient with scheduled pain management OxyContin 10 mg every 12 hours as well as Lyrica 100 mg 3 times daily.  He again presently continues on ampicillin as well as Rocephin through 05/20/2020.  His Eliquis currently remains on hold cardiac rate controlled amiodarone as directed with plan to resume blood thinner 05/15/2020.  Patient is again admitted to inpatient rehab services to continue aggressive inpatient rehab services.05/14/2020 .   Patient currently requires mod with mobility secondary to muscle weakness and muscle joint tightness, decreased cardiorespiratoy endurance and decreased standing balance, decreased postural control, decreased balance strategies and difficulty maintaining precautions.  Prior to hospitalization, patient was modified independent  with mobility and lived with Spouse in a House home.  Home access is 7-8Stairs to enter.  Patient will benefit from skilled PT intervention to maximize safe functional mobility, minimize fall risk and decrease caregiver burden for planned discharge home with intermittent assist.  Anticipate patient will benefit from follow up [facility] at discharge.  PT - End of Session Activity Tolerance: Tolerates 30+ min activity  with multiple rests Endurance Deficit: Yes Endurance Deficit Description: dyspnea w/exertion, gait is strenuous for pt PT Assessment Rehab Potential (ACUTE/IP ONLY): Good PT Barriers to Discharge:  Home environment access/layout;Inaccessible home environment;Decreased caregiver support;IV antibiotics;Weight bearing restrictions PT Barriers to Discharge Comments: wife works during day, 7-8 STE home no rail, sternotomy precautions PT Patient demonstrates impairments in the following area(s): Balance;Edema;Endurance;Motor;Pain;Safety;Skin Integrity PT Transfers Functional Problem(s): Bed Mobility;Bed to Chair;Car;Furniture PT Locomotion Functional Problem(s): Ambulation;Stairs PT Plan PT Intensity: Minimum of 1-2 x/day ,45 to 90 minutes PT Frequency: 5 out of 7 days PT Duration Estimated Length of Stay: 2.5-3 weeks PT Treatment/Interventions: Ambulation/gait training;Community reintegration;DME/adaptive equipment instruction;Neuromuscular re-education;Stair training;UE/LE Strength taining/ROM;Wheelchair propulsion/positioning;Balance/vestibular training;Discharge planning;Functional electrical stimulation;Pain management;Therapeutic Activities;UE/LE Coordination activities;Cognitive remediation/compensation;Disease management/prevention;Functional mobility training;Patient/family education;Therapeutic Exercise PT Transfers Anticipated Outcome(s): mod I PT Locomotion Anticipated Outcome(s): mod I PT Recommendation Follow Up Recommendations: Home health PT;24 hour supervision/assistance Patient destination: Home Equipment Recommended: To be determined  Skilled Therapeutic Intervention  Pt initially supine w/wife at bedside.  Therapist obtained and fitted wc, roho cushion due to existing skin breakdown, and elevating legrests due to ROM/edema/pain bilat knees. Supine to sit w/mod assist and cues for sternal precautions.  Pt reports that he does not adhere to precautions at night/educated risks and need to adhere at all times.   Pt instructed w/use of sliding board, set up, sequencing.  Pt then performed SBT bed to wc w/set up and min assist, cues for hand placement/precautions.  Pt then  transported to gym for evaluation. Evaluation completed (see details above and below) with education on PT POC and goals and individual treatment initiated with focus on functional mobility/transfers, LE strength, dynamic standing balance/coordination, ambulation, and improved endurance with activity. Pt unable to attempt gait w/standard walker, therefore assessed using swedish walker.  STS w/mod assist and stood x 1 min w/cues for posture.  Gait 70f w/mod assist of 2, wc follow, assist for IV pole.  Flexed posture, narrow base of support, inconsistent step length and cadence, instability at hips/trunk due to weakness, decreased step length and clearance.   Knee AROM seated in wc repeated x 8, see eval for very limited range. PROM knee flexion w/end range stretch x 10 count due to pain/limited tolerance.. Pt c/o back pain in wc.  PT obtained back support cushion and positioned in wc.  Pt stated improved comfort.  Pt oriented to unit, role of PT, discussed pt goals, team conference for determining LOS, and scheduling.  At end of session, pt transported back to room.  Pt c/o back pain and declined to remain oob in wc. Nursing notified and discussed SBT w/NT for return to bed.  NT agreed to assist pt.  PT Evaluation Precautions/Restrictions Precautions Precautions: Sternal;Fall Precaution Comments: freq cues to adhere to sternal prec Required Braces or Orthoses: Spinal Brace Spinal Brace: Lumbar corset Other Brace: for comfort, pt reports "it doesn't really help me" so we didn't use back brace Restrictions Weight Bearing Restrictions: No Other Position/Activity Restrictions: sternal precautions General   Vital SignsTherapy Vitals Temp: 98.1 F (36.7 C) Pulse Rate: 91 Resp: 18 BP: 111/75 Patient Position (if appropriate): Lying Oxygen Therapy SpO2: 100 % O2 Device: Room Air Pain Pain Assessment Pain Scale: 0-10 Pain Score: 5  Pain Type: Surgical pain Pain Location: Knee Pain  Orientation: Right;Left Pain Descriptors / Indicators: Aching;Sharp (sharp w/end rom) Home Living/Prior Functioning Home Living Living Arrangements: Spouse/significant other Available Help at Discharge: Family;Available 24 hours/day Type of Home: House Home Access: Stairs to enter [employer]  of Steps: 7-8 Entrance Stairs-Rails: Right;Left (can only reach one or the other, not both) Home Layout: One level Bathroom Shower/Tub: Multimedia programmer: Handicapped height Additional Comments: Grab bars in shower with built in seat, no grab bars near toilet  Lives With: Spouse Prior Function Level of Independence: Independent with basic ADLs;Requires assistive device for independence;Independent with transfers (assistive device was necessary only recently due to medical issues leading to hospitalization)  Able to Take Stairs?: Yes Driving: Yes Vocation: Full time employment Comments: independent in ADL and IADL prior to recent admission, enjoys shopping around town Vision/Perception     Cognition Overall Cognitive Status: Within Functional Limits for tasks assessed Arousal/Alertness: Awake/alert Orientation Level: Oriented X4 Attention: Focused;Sustained;Selective Focused Attention: Appears intact Sustained Attention: Appears intact Sensation Sensation Light Touch: Appears Intact Peripheral sensation comments: Some loss of light touch sensation in distal LEs Proprioception: Appears Intact Motor  Motor Motor - Skilled Clinical Observations: generalized weakness, limited by pain, edema  Mobility Bed Mobility Bed Mobility: Supine to Sit;Sit to Supine Rolling Right: Minimal Assistance - Patient > 75% Rolling Left: Minimal Assistance - Patient > 75% Supine to Sit: Minimal Assistance - Patient > 75% (w/adherence to sternotomy precs) Sitting - Scoot to Edge of Bed: Contact Guard/Touching assist Sit to Supine: Minimal Assistance - Patient >  75% Transfers Transfers: Sit to Stand;Stand to Sit Sit to Stand: Moderate Assistance - Patient 50-74% Stand to Sit: Moderate Assistance - Patient 50-74% Stand Pivot Transfers: 2 Helpers Stand Pivot Transfer Details: Verbal cues for sequencing;Verbal cues for technique;Verbal cues for precautions/safety;Manual facilitation for weight shifting Lateral/Scoot Transfers: Minimal Assistance - Patient > 75% (and set up using sliding board, cues for safety/sequencing/precs) Transfer (Assistive device): Other (Comment) (sliding board) Locomotion  Gait Ambulation: Yes Gait Assistance: Moderate Assistance - Patient 50-74%;2 Helpers Gait Distance (Feet): 20 Feet Assistive device: Other (Comment) (swedish walker) Gait Assistance Details: Verbal cues for gait pattern;Verbal cues for sequencing;Tactile cues for posture;Verbal cues for safe use of DME/AE;Verbal cues for precautions/safety;Manual facilitation for weight shifting Gait Assistance Details: pt w/flexed posture at hips/trunk, leans forward on walker, inconsistent step length and cadence, narrow base, instability at hips Gait Gait: Yes Gait Pattern: Impaired (pt w/flexed posture at hips/trunk, leans forward on walker, inconsistent step length and cadence, narrow base, instability at hips) Gait velocity: decreased Stairs / Additional Locomotion Stairs: No Wheelchair Mobility Wheelchair Mobility: Yes Wheelchair Assistance: Chartered loss adjuster: Both upper extremities Wheelchair Parts Management: Needs assistance Distance: 150 (cues for technique due to sternotomy precs)  Trunk/Postural Assessment  Cervical Assessment Cervical Assessment: Exceptions to [facility] (forward head) Thoracic Assessment Thoracic Assessment: Exceptions to [facility] (kyphosis) Lumbar Assessment Lumbar Assessment: Exceptions to [facility] (post rotation of pelvis in sitting) Postural Control Postural Control: Deficits on evaluation Trunk Control: see  gait Protective Responses: delayed, pain and edema limiting factors  Balance Balance Balance Assessed: Yes Static Sitting Balance Static Sitting - Balance Support: Feet supported Static Sitting - Level of Assistance: 5: Stand by assistance Dynamic Sitting Balance Dynamic Sitting - Balance Support: Feet supported Dynamic Sitting - Level of Assistance: 5: Stand by assistance Sitting balance - Comments: maintains w/SBT to wc w/supervision Static Standing Balance Static Standing - Balance Support: Bilateral upper extremity supported Static Standing - Level of Assistance: 3: Mod assist Static Standing - Comment/# of Minutes: 1 min w/swedish walker Dynamic Standing Balance Dynamic Standing - Balance Support: Bilateral upper extremity supported Dynamic Standing - Level of Assistance: 3: Mod assist Dynamic Standing - Comments: see gait Extremity Assessment  RUE Assessment General Strength Comments: no resistance given due to sternotomy precautions, demonstrates good functional strength w/transfers LUE Assessment General Strength Comments: no resistance given due to sternotomy precautions, demonstrates good functional strength w/transfers RLE Assessment Passive Range of Motion (PROM) Comments: at Knee extension limited approx 10degress by edema, flexion 80* w/springy end feel Active Range of Motion (AROM) Comments: Extension lacks 65degrees, flexion AROM to 75* General Strength Comments: 2/5 flexion/extension at knee, grossly 3-/5 hip, ankle at least 4/5 LLE Assessment Passive Range of Motion (PROM) Comments: Knee extension lacks 10* due to edema/pain, flexion to 80* w/springy end feel Active Range of Motion (AROM) Comments: knee flexion to 80, extension lacks 70* General Strength Comments: 2/5 flexion/extension at knee, grossly 3-/5 hip, ankle at least 4/5    Refer to Care Plan for Long Term Goals  Recommendations for other services: None   Discharge Criteria: Patient will be  discharged from PT if patient refuses treatment 3 consecutive times without medical reason, if treatment goals not met, if there is a change in medical status, if patient makes no progress towards goals or if patient is discharged from hospital.  The above assessment, treatment plan, treatment alternatives and goals were discussed and mutually agreed upon: by patient  Jerrilyn Cairo 05/15/2020, 3:21 PM

## 2020-05-15 NOTE — Progress Notes (Signed)
Tool Individual Statement of Services  Patient Name:  Johnathan Murray  Date:  05/15/2020  Welcome to the [facility].  Our goal is to provide you with an individualized program based on your diagnosis and situation, designed to meet your specific needs.  With this comprehensive rehabilitation program, you will be expected to participate in at least 3 hours of rehabilitation therapies Monday-Friday, with modified therapy programming on the weekends.  Your rehabilitation program will include the following services:  Physical Therapy (PT), Occupational Therapy (OT), Speech Therapy (ST), 24 hour per day rehabilitation nursing, Therapeutic Recreaction (TR), Neuropsychology, Care Coordinator, Rehabilitation Medicine, Nutrition Services, Pharmacy Services and Other  Weekly team conferences will be held on Wednesdays to discuss your progress.  Your Inpatient Rehabilitation Care Coordinator will talk with you frequently to get your input and to update you on team discussions.  Team conferences with you and your family in attendance may also be held.  Expected length of stay: 16- 19 Days  Overall anticipated outcome: Supervision to MOD I  Depending on your progress and recovery, your program may change. Your Inpatient Rehabilitation Care Coordinator will coordinate services and will keep you informed of any changes. Your Inpatient Rehabilitation Care Coordinator's name and contact numbers are listed  below.  The following services may also be recommended but are not provided by the [facility]:    [place] will be made to provide these services after discharge if needed.  Arrangements include referral to agencies that provide these services.  Your insurance has been verified to be:  [facility] [facility] Your primary doctor is:  Willaim Sheng, MD  Pertinent  information will be shared with your doctor and your insurance company.  Inpatient Rehabilitation Care Coordinator:  Erlene Quan, [place] or [phone]  Information discussed with and copy given to patient by: [name], 05/15/2020, 9:42 AM

## 2020-05-15 NOTE — Progress Notes (Signed)
[place] PHYSICAL MEDICINE & REHABILITATION PROGRESS NOTE  Subjective/Complaints: Patient seen sitting up at edge of his bed working with therapy this morning.  He states he slept well overnight.  Wife also at bedside.  Discussed bed mobility and sit to stand with sternal precautions and LE pain with therapies.  Discussed icing knees with patient and wife.  ROS: Denies CP, SOB, N/V/D  Objective: Vital Signs: Blood pressure 122/80, pulse 82, temperature 99 F (37.2 C), temperature source Oral, resp. rate 18, height 5\' 9"  (1.753 m), weight 91 kg, SpO2 99 %. No results found. Recent Labs    05/14/20 0446 05/15/20 0629  WBC 8.6 9.3  HGB 8.4* 8.4*  HCT 27.8* 27.6*  PLT 417* 417*   Recent Labs    05/14/20 0446 05/15/20 0629  NA 136 137  K 4.0 4.1  CL 100 101  CO2 26 27  GLUCOSE 126* 115*  BUN 17 18  CREATININE 1.20 1.22  CALCIUM 8.4* 8.5*    Physical Exam: BP 122/80 (BP Location: Left Arm)   Pulse 82   Temp 99 F (37.2 C) (Oral)   Resp 18   Ht 5\' 9"  (1.753 m)   Wt 91 kg   SpO2 99%   BMI 29.63 kg/m  Constitutional: No distress . Vital signs reviewed. HENT: Normocephalic.  Atraumatic. Eyes: EOMI. No discharge. Cardiovascular: No JVD.  + Tachycardia. Respiratory: Normal effort.  No stridor.  Bilateral clear to auscultation. GI: Non-distended. Skin: Bilateral knees with staples, some erythema along staple lines Psych: Normal mood.  Normal behavior. Musc: Bilateral knees with edema and tenderness Neuro: Alert Motor: B/l UE: 5/5 proximal to distal Bilateral lower extremities: Hip flexion 3 -/5, knee extension 3+/5, ankle dorsiflexion 4+/5  Assessment/Plan: 1. Functional deficits secondary to debility which require 3+ hours per day of interdisciplinary therapy in a comprehensive inpatient rehab setting.  Physiatrist is providing close team supervision and 24 hour management of active medical problems listed below.  Physiatrist and rehab team continue to assess  barriers to discharge/monitor patient progress toward functional and medical goals  Care Tool:  Bathing    Body parts bathed by patient: Right arm, Left arm, Chest, Abdomen, Front perineal area, Right upper leg, Left upper leg, Face   Body parts bathed by helper: Right lower leg, Left lower leg     Bathing assist Assist Level: Minimal Assistance - Patient > 75%     Upper Body Dressing/Undressing Upper body dressing   What is the patient wearing?: Hospital gown only    Upper body assist Assist Level: Minimal Assistance - Patient > 75%    Lower Body Dressing/Undressing Lower body dressing      What is the patient wearing?: Pants     Lower body assist Assist for lower body dressing: Maximal Assistance - Patient 25 - 49%     Toileting Toileting    Toileting assist Assist for toileting: Supervision/Verbal cueing     Transfers Chair/bed transfer  Transfers assist     Chair/bed transfer assist level: Total Assistance - Patient < 25%     Locomotion Ambulation   Ambulation assist              Walk 10 feet activity   Assist           Walk 50 feet activity   Assist           Walk 150 feet activity   Assist           Walk 10  feet on uneven surface  activity   Assist           Wheelchair     Assist               Wheelchair 50 feet with 2 turns activity    Assist            Wheelchair 150 feet activity     Assist            Medical Problem List and Plan: 1.  Decreased functional mobility secondary to sepsis/Enterococcus faecalis bacteremia/discitis/osteomyelitis of L4-5 status post aortic root replacement 04/17/2020 status post cardioversion for atrial fibrillation.  Sternal precautions/lumbar corset for comfort x 4 more wees  Begin CIR evaluation 2.  Antithrombotics: -DVT/anticoagulation: Resumed Eliquis 05/15/2020             -antiplatelet therapy: N/A 3. Pain Management: OxyContin sustained  release 10 mg every 12 hours, Lyrica 100 mg 3 times daily Flexeril as as well as oxycodone needed  Monitor with increased exertion 4. Mood: N/A meds- follow for ego support             -antipsychotic agents: N/A 5. Neuropsych: This patient is capable of making decisions on his own behalf. 6. Skin/Wound Care: Routine skin checks 7. Fluids/Electrolytes/Nutrition: Routine in and outs. 8.  ID/Enterococcus bacteremia.  Continue IV ampicillin 2 g every 4 hours as well as Rocephin 2 g every 12 hours through 6/30.  Follow-up infectious disease 9.  Acute on chronic anemia.  Continue iron supplement.    Hb 8.4 on 6/25, labs ordered for Monday  Cont to monitor 10.  Bilateral septic knees.  Status post total synovectomy irrigation debridement bilateral knees 05/02/2020 per Dr. Sharol Given.  Weightbearing as tolerated.   Encouraged ice to bilateral knees  11.  AKI.     Creatinine 1.22 on 6/25, labs ordered for Monday 12.  PAF.  Follow-up cardiology services.  Amiodarone as directed.  Lopressor 12.5 mg twice daily.    Monitor with increased exertion  Tachycardic this morning-likely secondary to exertion with therapies however will order ECG for further evaluation 13.  Diabetes mellitus with hyperglycemia.  Latest hemoglobin A1c 6.8.  Lantus insulin 10 units twice daily.  Monitor with increased mobility 14.  Recent C3-4 ACDF 03/02/2020 per [name] for cervical myelopathy.  Cervical collar discontinued 15.  Hypothyroidism.  Synthroid 16.    Drug-induced constipation.  MiraLAX daily, Senokot nightly  Adjust bowel meds as necessary 17.  Severe hypoalbuminemia  Supplement initiated on 6/25 18.  Transaminitis  LFTs elevated on 6/25, labs ordered  LOS: 1 days A FACE TO FACE EVALUATION WAS PERFORMED  Corlene Sabia Lorie Phenix 05/15/2020, 9:24 AM

## 2020-05-15 NOTE — Progress Notes (Signed)
Occupational Therapy Session Note  Patient Details  Name: Johnathan Murray MRN: [id number] Date of Birth: 1951/11/11  Today's Date: 05/15/2020 OT Individual Time: 1100-1157 OT Individual Time Calculation (min): 57 min   Short Term Goals: Week 1:  OT Short Term Goal 1 (Week 1): Pt will don pants with modA and LRAD PRN. OT Short Term Goal 2 (Week 1): Pt will complete toilet transfer to [facility] with modA. OT Short Term Goal 3 (Week 1): Pt will adhere to sternal precautions with min verbal cues during ADL task.  Skilled Therapeutic Interventions/Progress Updates:  Community reintegration;Discharge planning;DME/adaptive equipment instruction;Functional mobility training;Pain management;Patient/family education;Self Care/advanced ADL retraining;Psychosocial support;Therapeutic Activities;Therapeutic Exercise;UE/LE Strength taining/ROM;UE/LE Coordination activities;Skin care/wound managment;Balance/vestibular training   Pt greeted semi-reclined in bed with wife present. Pt agreeable to OT treatment session. Pt reported pain in back and fatigue from earlier therapy sessions, but eager to work with therapy. Pt completed bed mobility with Min A to advance LE towards EOB and Mod A to elevate trunk. Worked on sit<>stands from raised bed as pt unable to tolerate enough knee flexion to stand from shorter bed. Used regular RW with spouse holding RW. Mod A to come to standing and hold for 1 minute. Pt then return to sitting to rest. Upon second stand, pt took 4 steps forward and back. Pt had knee buckle of LLE requiring max A to recover. Pt took longer rest break this time, then stood again in similar fashion and took 3 side steps towards [facility] with OT assist to maneuver RW. Pt reported max fatigue and returned to supine with max A from OT to lift BLEs back in the bed. Pt completed bed level LB there-ex including, glute squeezes, quad sets, hip adduction/abduction, and knee flexion. OT applied Ice packs to B knees and  pt left semi-reclined in bed with spouse present, bed alarm on, and needs met.   Therapy Documentation Precautions:  Precautions Precautions: Sternal, Fall Precaution Comments: freq cues to adhere to sternal prec Required Braces or Orthoses: Spinal Brace Spinal Brace: Lumbar corset Other Brace: for comfort, pt reports "it doesn't really help me" so we didn't use back brace Restrictions Weight Bearing Restrictions: No Other Position/Activity Restrictions: sternal precautions Pain: No pain number given, pt reported increased back pain, repositioned and rest for comfort.   Therapy/Group: Individual Therapy  Valma Cava 05/15/2020, 11:58 AM

## 2020-05-16 LAB — GLUCOSE, CAPILLARY
Glucose-Capillary: 104 mg/dL — ABNORMAL HIGH (ref 70–99)
Glucose-Capillary: 132 mg/dL — ABNORMAL HIGH (ref 70–99)
Glucose-Capillary: 141 mg/dL — ABNORMAL HIGH (ref 70–99)
Glucose-Capillary: 121 mg/dL — ABNORMAL HIGH (ref 70–99)

## 2020-05-16 NOTE — Progress Notes (Signed)
[place] PHYSICAL MEDICINE & REHABILITATION PROGRESS NOTE  Subjective/Complaints:   Pt reports off today for PT and OT- walked 28 ft yesterday- was excited.  Knees "ok".    ROS: Pt denies SOB, abd pain, CP, N/V/C/D, and vision changes   Objective: Vital Signs: Blood pressure 109/78, pulse 91, temperature 98.3 F (36.8 C), temperature source Oral, resp. rate 15, height 5\' 9"  (1.753 m), weight 91 kg, SpO2 99 %. No results found. Recent Labs    05/14/20 0446 05/15/20 0629  WBC 8.6 9.3  HGB 8.4* 8.4*  HCT 27.8* 27.6*  PLT 417* 417*   Recent Labs    05/14/20 0446 05/15/20 0629  NA 136 137  K 4.0 4.1  CL 100 101  CO2 26 27  GLUCOSE 126* 115*  BUN 17 18  CREATININE 1.20 1.22  CALCIUM 8.4* 8.5*    Physical Exam: BP 109/78 (BP Location: Left Arm)   Pulse 91   Temp 98.3 F (36.8 C) (Oral)   Resp 15   Ht 5\' 9"  (1.753 m)   Wt 91 kg   SpO2 99%   BMI 29.63 kg/m  Constitutional: sitting up in bed- RN in room, NAD HENT: Normocephalic.  Atraumatic. Eyes: conjugate gaze Cardiovascular: RRR. Respiratory: CTA B/L- no W/R/R- good air movement GI: Soft, NT, ND, (+)BS . Skin: BB/L knee incisions- moderate swelling of knees L>R- staples in place- localized erythema; warm, not hot Psych: appropriate Musc: Bilateral knees with edema and tenderness Neuro: Alert Motor: B/l UE: 5/5 proximal to distal Bilateral lower extremities: Hip flexion 3 -/5, knee extension 3+/5, ankle dorsiflexion 4+/5  Assessment/Plan: 1. Functional deficits secondary to debility due to septic knees, disciitis which require 3+ hours per day of interdisciplinary therapy in a comprehensive inpatient rehab setting.  Physiatrist is providing close team supervision and 24 hour management of active medical problems listed below.  Physiatrist and rehab team continue to assess barriers to discharge/monitor patient progress toward functional and medical goals  Care Tool:  Bathing    Body parts bathed  by patient: Right arm, Left arm, Chest, Abdomen, Front perineal area, Right upper leg, Left upper leg, Face   Body parts bathed by helper: Right lower leg, Left lower leg, Buttocks     Bathing assist Assist Level: 2 Helpers (for sit > stand to wash buttocks)     Upper Body Dressing/Undressing Upper body dressing   What is the patient wearing?: Hospital gown only    Upper body assist Assist Level: Minimal Assistance - Patient > 75%    Lower Body Dressing/Undressing Lower body dressing      What is the patient wearing?: Pants     Lower body assist Assist for lower body dressing: 2 Helpers     Toileting Toileting Toileting Activity did not occur (Clothing management and hygiene only): N/A (no void or bm)  Toileting assist Assist for toileting: Supervision/Verbal cueing     Transfers Chair/bed transfer  Transfers assist     Chair/bed transfer assist level: Moderate Assistance - Patient 50 - 74%     Locomotion Ambulation   Ambulation assist      Assist level: 2 helpers Assistive device: Walker-platform Max distance: 30   Walk 10 feet activity   Assist     Assist level: 2 helpers Assistive device: Walker-platform   Walk 50 feet activity   Assist Walk 50 feet with 2 turns activity did not occur: Safety/medical concerns         Walk 150 feet activity  Assist Walk 150 feet activity did not occur: Safety/medical concerns         Walk 10 feet on uneven surface  activity   Assist Walk 10 feet on uneven surfaces activity did not occur: Safety/medical concerns         Wheelchair     Assist Will patient use wheelchair at discharge?: No             Wheelchair 50 feet with 2 turns activity    Assist            Wheelchair 150 feet activity     Assist            Medical Problem List and Plan: 1.  Decreased functional mobility secondary to sepsis/Enterococcus faecalis bacteremia/discitis/osteomyelitis of L4-5  status post aortic root replacement 04/17/2020 status post cardioversion for atrial fibrillation.  Sternal precautions/lumbar corset for comfort x 4 more weeks  Begin CIR evaluation 2.  Antithrombotics: -DVT/anticoagulation: Resumed Eliquis 05/15/2020             -antiplatelet therapy: N/A 3. Pain Management: OxyContin sustained release 10 mg every 12 hours, Lyrica 100 mg 3 times daily Flexeril as as well as oxycodone needed  6/26- pain better controlled-con't regimen  Monitor with increased exertion 4. Mood: N/A meds- follow for ego support             -antipsychotic agents: N/A 5. Neuropsych: This patient is capable of making decisions on his own behalf. 6. Skin/Wound Care: Routine skin checks 7. Fluids/Electrolytes/Nutrition: Routine in and outs. 8.  ID/Enterococcus bacteremia.  Continue IV ampicillin 2 g every 4 hours as well as Rocephin 2 g every 12 hours through 6/30.  Follow-up infectious disease 9.  Acute on chronic anemia.  Continue iron supplement.    Hb 8.4 on 6/25, labs ordered for Monday  Cont to monitor 10.  Bilateral septic knees.  Status post total synovectomy irrigation debridement bilateral knees 05/02/2020 per Dr. Sharol Given.  Weightbearing as tolerated.   Encouraged ice to bilateral knees  11.  AKI.     Creatinine 1.22 on 6/25, labs ordered for Monday 12.  PAF.  Follow-up cardiology services.  Amiodarone as directed.  Lopressor 12.5 mg twice daily.    Monitor with increased exertion  Tachycardic this morning-likely secondary to exertion with therapies however will order ECG for further evaluation  6/26- rate in 90s today- doing better 13.  Diabetes mellitus with hyperglycemia.  Latest hemoglobin A1c 6.8.  Lantus insulin 10 units twice daily.   CBG (last 3)  Recent Labs    05/15/20 1946 05/16/20 0626 05/16/20 1135  GLUCAP 141* 104* 132*    6/26- BGs low 100s- con't regimen  Monitor with increased mobility 14.  Recent C3-4 ACDF 03/02/2020 per [name] for cervical  myelopathy.  Cervical collar discontinued 15.  Hypothyroidism.  Synthroid 16.    Drug-induced constipation.  MiraLAX daily, Senokot nightly  Adjust bowel meds as necessary 17.  Severe hypoalbuminemia  Supplement initiated on 6/25 18.  Transaminitis  LFTs elevated on 6/25, labs ordered  LOS: 2 days A FACE TO FACE EVALUATION WAS PERFORMED  Fayola Meckes 05/16/2020, 2:51 PM

## 2020-05-17 ENCOUNTER — Inpatient Hospital Stay (HOSPITAL_COMMUNITY): Payer: BC Managed Care – PPO | Admitting: Occupational Therapy

## 2020-05-17 ENCOUNTER — Inpatient Hospital Stay (HOSPITAL_COMMUNITY): Payer: BC Managed Care – PPO

## 2020-05-17 ENCOUNTER — Inpatient Hospital Stay (HOSPITAL_COMMUNITY): Payer: BC Managed Care – PPO | Admitting: Physical Therapy

## 2020-05-17 LAB — GLUCOSE, CAPILLARY
Glucose-Capillary: 98 mg/dL (ref 70–99)
Glucose-Capillary: 149 mg/dL — ABNORMAL HIGH (ref 70–99)
Glucose-Capillary: 90 mg/dL (ref 70–99)
Glucose-Capillary: 143 mg/dL — ABNORMAL HIGH (ref 70–99)

## 2020-05-17 NOTE — Progress Notes (Signed)
Occupational Therapy Session Note  Patient Details  Name: Johnathan Murray MRN: [id number] Date of Birth: 08-04-1951  Today's Date: 05/17/2020 OT Individual Time: 1000-1100 OT Individual Time Calculation (min): 60 min    Short Term Goals: Week 1:  OT Short Term Goal 1 (Week 1): Pt will don pants with modA and LRAD PRN. OT Short Term Goal 2 (Week 1): Pt will complete toilet transfer to [facility] with modA. OT Short Term Goal 3 (Week 1): Pt will adhere to sternal precautions with min verbal cues during ADL task. OT Short Term Goal 4 (Week 1): Pt will complete bathing at sit > stand level with mod assist of one caregiver  Skilled Therapeutic Interventions/Progress Updates:    OT session focused on ADL retraining, functional transfers, standing balance, and strengthening. Pt received sitting in w/c reporting back pain. OT provided heat pad throughout session with pt reporting some relief and able to participate well. Pt completed bathing and UB dressing at sink, declining buttocks/peri hygiene. Pt require min A for sit<>stand at RW, remaining in standing ~2 min. Pt requesting to transfer to bed completing stand pivot transfer with min A using RW and increased time. OT provided mod assist to transfer sit>supine using log roll technique. Engaged in BLE strengthening and ROM exercises 2x10 reps heel pumps, knee flexion/extension, and hip abduction/adduction. At end of session, pt left in bed with all needs in reach.   Therapy Documentation Precautions:  Precautions Precautions: Sternal, Fall Precaution Comments: freq cues to adhere to sternal prec Required Braces or Orthoses: Spinal Brace Spinal Brace: Lumbar corset Other Brace: for comfort, pt reports "it doesn't really help me" so we didn't use back brace Restrictions Weight Bearing Restrictions: No Other Position/Activity Restrictions: sternal precautions General:   Vital Signs:  Pain:   ADL: ADL Grooming: Supervision/safety Where  Assessed-Grooming: Edge of bed Upper Body Bathing: Supervision/safety Where Assessed-Upper Body Bathing: Edge of bed Lower Body Bathing: Moderate assistance (+2 for sit > stand) Where Assessed-Lower Body Bathing: Edge of bed Upper Body Dressing: Minimal assistance (hospital gown due to PICC line) Where Assessed-Upper Body Dressing: Edge of bed Lower Body Dressing: Dependent (Mod A +2 for sit > stand) Where Assessed-Lower Body Dressing: Edge of bed (sit to stand level) Vision   Perception    Praxis   Exercises:   Other Treatments:     Therapy/Group: Individual Therapy  Duayne Cal 05/17/2020, 11:55 AM

## 2020-05-17 NOTE — Progress Notes (Signed)
[place] PHYSICAL MEDICINE & REHABILITATION PROGRESS NOTE  Subjective/Complaints:   Pt reports no issues- had yesterday off therapy- admits that back pain is back somewhat- 1 "Spot" can set it off "wrong" from RLE- if turns the foot the wrong way.    ROS:  Pt denies SOB, abd pain, CP, N/V/C/D, and vision changes  Objective: Vital Signs: Blood pressure 116/74, pulse (!) 103, temperature 98.5 F (36.9 C), resp. rate 18, height 5\' 9"  (1.753 m), weight 91 kg, SpO2 100 %. No results found. Recent Labs    05/15/20 0629  WBC 9.3  HGB 8.4*  HCT 27.6*  PLT 417*   Recent Labs    05/15/20 0629  NA 137  K 4.1  CL 101  CO2 27  GLUCOSE 115*  BUN 18  CREATININE 1.22  CALCIUM 8.5*    Physical Exam: BP 116/74 (BP Location: Left Arm)   Pulse (!) 103   Temp 98.5 F (36.9 C)   Resp 18   Ht 5\' 9"  (1.753 m)   Wt 91 kg   SpO2 100%   BMI 29.63 kg/m  Constitutional: sitting up in manual w/c in room. Wife folding blankets/doing bedding, NAD HENT: Normocephalic.  Atraumatic. Eyes: conjugate gaze Cardiovascular: tachycardic- mild- irregular occ.  Respiratory: CTA B/L- no W/R/R- good air movement GI: Soft, NT, ND, (+)BS  Skin: BB/L knee incisions- moderate swelling of knees L>R- staples in place- localized erythema; warm, not hot- no change today Psych: slightly flat affect Musc: Bilateral knees with edema and tenderness Neuro: Alert Motor: B/l UE: 5/5 proximal to distal Bilateral lower extremities: Hip flexion 3 -/5, knee extension 3+/5, ankle dorsiflexion 4+/5  Assessment/Plan: 1. Functional deficits secondary to debility due to septic knees, disciitis which require 3+ hours per day of interdisciplinary therapy in a comprehensive inpatient rehab setting.  Physiatrist is providing close team supervision and 24 hour management of active medical problems listed below.  Physiatrist and rehab team continue to assess barriers to discharge/monitor patient progress toward  functional and medical goals  Care Tool:  Bathing  Bathing activity did not occur: Refused Body parts bathed by patient: Right arm, Left arm, Chest, Abdomen, Right upper leg, Left upper leg, Face   Body parts bathed by helper: Right lower leg, Left lower leg     Bathing assist Assist Level: Minimal Assistance - Patient > 75%     Upper Body Dressing/Undressing Upper body dressing   What is the patient wearing?: Pull over shirt    Upper body assist Assist Level: Minimal Assistance - Patient > 75%    Lower Body Dressing/Undressing Lower body dressing      What is the patient wearing?: Pants     Lower body assist Assist for lower body dressing: Minimal Assistance - Patient > 75%     Toileting Toileting Toileting Activity did not occur (Clothing management and hygiene only): N/A (no void or bm)  Toileting assist Assist for toileting: Supervision/Verbal cueing     Transfers Chair/bed transfer  Transfers assist     Chair/bed transfer assist level: Minimal Assistance - Patient > 75%     Locomotion Ambulation   Ambulation assist      Assist level: 2 helpers Assistive device: Walker-platform Max distance: 30   Walk 10 feet activity   Assist     Assist level: 2 helpers Assistive device: Walker-platform   Walk 50 feet activity   Assist Walk 50 feet with 2 turns activity did not occur: Safety/medical concerns  Walk 150 feet activity   Assist Walk 150 feet activity did not occur: Safety/medical concerns         Walk 10 feet on uneven surface  activity   Assist Walk 10 feet on uneven surfaces activity did not occur: Safety/medical concerns         Wheelchair     Assist Will patient use wheelchair at discharge?: No             Wheelchair 50 feet with 2 turns activity    Assist            Wheelchair 150 feet activity     Assist            Medical Problem List and Plan: 1.  Decreased functional  mobility secondary to sepsis/Enterococcus faecalis bacteremia/discitis/osteomyelitis of L4-5 status post aortic root replacement 04/17/2020 status post cardioversion for atrial fibrillation.  Sternal precautions/lumbar corset for comfort x 4 more weeks  Begin CIR evaluation 2.  Antithrombotics: -DVT/anticoagulation: Resumed Eliquis 05/15/2020             -antiplatelet therapy: N/A 3. Pain Management: OxyContin sustained release 10 mg every 12 hours, Lyrica 100 mg 3 times daily Flexeril as as well as oxycodone needed  6/26- pain better controlled-con't regimen  6/27- back pain is being set off today, but doesn't want to change meds  Monitor with increased exertion 4. Mood: N/A meds- follow for ego support             -antipsychotic agents: N/A 5. Neuropsych: This patient is capable of making decisions on his own behalf. 6. Skin/Wound Care: Routine skin checks 7. Fluids/Electrolytes/Nutrition: Routine in and outs. 8.  ID/Enterococcus bacteremia.  Continue IV ampicillin 2 g every 4 hours as well as Rocephin 2 g every 12 hours through 6/30.  Follow-up infectious disease 9.  Acute on chronic anemia.  Continue iron supplement.    Hb 8.4 on 6/25, labs ordered for Monday  Cont to monitor 10.  Bilateral septic knees.  Status post total synovectomy irrigation debridement bilateral knees 05/02/2020 per Dr. Sharol Given.  Weightbearing as tolerated.   Encouraged ice to bilateral knees  11.  AKI.     Creatinine 1.22 on 6/25, labs ordered for Monday 12.  PAF.  Follow-up cardiology services.  Amiodarone as directed.  Lopressor 12.5 mg twice daily.    Monitor with increased exertion  Tachycardic this morning-likely secondary to exertion with therapies however will order ECG for further evaluation  6/26- rate in 90s today- doing better  6/27- rate low 100s- con't meds 13.  Diabetes mellitus with hyperglycemia.  Latest hemoglobin A1c 6.8.  Lantus insulin 10 units twice daily.   CBG (last 3)  Recent Labs     05/16/20 1947 05/17/20 0633 05/17/20 1118  GLUCAP 121* 98 149*    6/27- BGs controlled- con't regimen  Monitor with increased mobility 14.  Recent C3-4 ACDF 03/02/2020 per [name] for cervical myelopathy.  Cervical collar discontinued 15.  Hypothyroidism.  Synthroid 16.    Drug-induced constipation.  MiraLAX daily, Senokot nightly  Adjust bowel meds as necessary 17.  Severe hypoalbuminemia  Supplement initiated on 6/25 18.  Transaminitis  LFTs elevated on 6/25, labs ordered  LOS: 3 days A FACE TO FACE EVALUATION WAS PERFORMED  Carrianne Hyun 05/17/2020, 2:01 PM

## 2020-05-17 NOTE — Plan of Care (Signed)
  Problem: RH SAFETY Goal: RH STG ADHERE TO SAFETY PRECAUTIONS W/ASSISTANCE/DEVICE Description: STG Adhere to Safety Precautions With cues/reminders Assistance/Device. Outcome: Progressing   Problem: [facility] PAIN MANAGEMENT Goal: RH STG PAIN MANAGED AT OR BELOW PT'S PAIN GOAL Description: Less than 4  Outcome: Progressing

## 2020-05-17 NOTE — Progress Notes (Signed)
Physical Therapy Session Note  Patient Details  Name: Mathew Postiglione MRN: [id number] Date of Birth: [birth year]  Today's Date: 05/17/2020 PT Individual Time: 1400-1505 PT Individual Time Calculation (min): 65 min   Short Term Goals: Week 1:  PT Short Term Goal 1 (Week 1): pt will perform supine to sit w/cga and adhere to precautions PT Short Term Goal 2 (Week 1): Pt will perform STS w/min assist PT Short Term Goal 3 (Week 1): pt will perform bed to chair w/min assist PT Short Term Goal 4 (Week 1): Pt will ambulate 7ft w/LRAD and mod assist of 1  Skilled Therapeutic Interventions/Progress Updates: Pt presented in bed with wife present agreeable to therapy. Pt states knees are "ok" with pitting edema noted in B knees (nsg notified). Pt performed supine to sit with CGA, increased time and heavy use of bed features. Pt with one occurrence of back pain/spasm upon coming to full upright position which resolved quickly without intervention. Pt donned LSO with minA for placement. Pt performed STS with RW and bed elevated per pt request. When cued for hand placement pt expressed that he was unable to perform without pulling up on RW. Upon standing pt performed stand pivot with RW with minA and increased time with pt stopping halfway between transfer due to back spasm. Pt then propelled to rehab gym with supervision and PTA assisting with IV pole management. Pt then participated in gait training requesting use of Ethelene Hal vs RW. Pt ambulated x 24ft with Harmon Pier walker requiring modA for STS and pt with forward flexed posture and unable to achieve full knee extension. Pt required mod cues to improve posture during ambulation. Pt then attempted additional ambulation however was only able to ambulate additional 2 ft due to fatigue. Pt attempted stand pivot to mat with RW and was able to stand with modA but unable to advance RLE once in standing due to fatigue. Pt then transported to day room and participated in  Cybex Kinetron 90cm/sec for BLE strengthening and B knee ROM. Pt performed x 5 cycles of 10 with PTA attempting to bring w/c closer to device to increase B knee flexion. Pt then transported back to room and for safety due to BLE fatigue performed STS in [place] with pt requiring modA for STS. Pt transferred to bed and performed sit to supine via sidelying with modA for BLE management with pt continuing to have intermittent back spasm with transitional movements. Pt repositioned to comfort and left with call bell within reach and wife and nsg present.      Therapy Documentation Precautions:  Precautions Precautions: Sternal, Fall Precaution Comments: freq cues to adhere to sternal prec Required Braces or Orthoses: Spinal Brace Spinal Brace: Lumbar corset Other Brace: for comfort, pt reports "it doesn't really help me" so we didn't use back brace Restrictions Weight Bearing Restrictions: No Other Position/Activity Restrictions: sternal precautions General:   Vital Signs: Therapy Vitals Temp: 98.5 F (36.9 C) Pulse Rate: (!) 103 Resp: 18 BP: 116/74 Patient Position (if appropriate): Lying Oxygen Therapy SpO2: 100 % O2 Device: Room Air    Therapy/Group: Individual Therapy  Alvira Hecht  Janson Lamar, PTA  05/17/2020, 4:47 PM

## 2020-05-17 NOTE — Progress Notes (Signed)
Occupational Therapy Session Note  Patient Details  Name: Johnathan Murray MRN: [id number] Date of Birth: [birth year]  Today's Date: 05/17/2020 OT Individual Time: 1694-5038 OT Individual Time Calculation (min): 58 min   Short Term Goals: Week 1:  OT Short Term Goal 1 (Week 1): Pt will don pants with modA and LRAD PRN. OT Short Term Goal 2 (Week 1): Pt will complete toilet transfer to [facility] with modA. OT Short Term Goal 3 (Week 1): Pt will adhere to sternal precautions with min verbal cues during ADL task. OT Short Term Goal 4 (Week 1): Pt will complete bathing at sit > stand level with mod assist of one caregiver  Skilled Therapeutic Interventions/Progress Updates:    Pt greeted in bed and premedicated for pain. Spouse [name] present. He wanted to get dressed so OT initiated reacher training bedlevel. Pt required increased time, vcs and Min A to pull pants over hips when logrolling Rt>Lt in supine. Supine<sit completed with increased time and HOB elevated. Pt then donned his shirt with vcs, Min A and cues to don his LSO using adaptive technique to adhere to sternal/back precautions. Transitioned to education regarding sock aide use, pt able to don both gripper socks with device given supervision assist, educated pt to keep arms close to body and do multiple small pulls for sternal precautions. He reported no chest discomfort/pain when using sock aide. Stand pivot<w/c completed with Mod A of 1 for power up while [name] steadied the RW, CGA of 2 for stand pivot to the w/c. Pt was left sitting in the w/c with all needs and [name] still present. Tx focus placed on activity tolerance, adaptive self care skills, and transfers.    Therapy Documentation Precautions:  Precautions Precautions: Sternal, Fall Precaution Comments: freq cues to adhere to sternal prec Required Braces or Orthoses: Spinal Brace Spinal Brace: Lumbar corset Other Brace: for comfort, pt reports "it doesn't really help me" so  we didn't use back brace Restrictions Weight Bearing Restrictions: No Other Position/Activity Restrictions: sternal precautions ADL: ADL Grooming: Supervision/safety Where Assessed-Grooming: Edge of bed Upper Body Bathing: Supervision/safety Where Assessed-Upper Body Bathing: Edge of bed Lower Body Bathing: Moderate assistance (+2 for sit > stand) Where Assessed-Lower Body Bathing: Edge of bed Upper Body Dressing: Minimal assistance (hospital gown due to PICC line) Where Assessed-Upper Body Dressing: Edge of bed Lower Body Dressing: Dependent (Mod A +2 for sit > stand) Where Assessed-Lower Body Dressing: Edge of bed (sit to stand level)      Therapy/Group: Individual Therapy  Gabriell Daigneault A Aritzel Krusemark 05/17/2020, 12:42 PM

## 2020-05-18 ENCOUNTER — Inpatient Hospital Stay (HOSPITAL_COMMUNITY): Payer: BC Managed Care – PPO | Admitting: Occupational Therapy

## 2020-05-18 ENCOUNTER — Encounter (HOSPITAL_COMMUNITY): Payer: Self-pay | Admitting: Physical Medicine & Rehabilitation

## 2020-05-18 ENCOUNTER — Inpatient Hospital Stay (HOSPITAL_COMMUNITY): Payer: BC Managed Care – PPO

## 2020-05-18 DIAGNOSIS — R609 Edema, unspecified: Secondary | ICD-10-CM

## 2020-05-18 DIAGNOSIS — R7309 Other abnormal glucose: Secondary | ICD-10-CM

## 2020-05-18 DIAGNOSIS — Z87898 Personal history of other specified conditions: Secondary | ICD-10-CM

## 2020-05-18 DIAGNOSIS — R601 Generalized edema: Secondary | ICD-10-CM

## 2020-05-18 DIAGNOSIS — R600 Localized edema: Secondary | ICD-10-CM

## 2020-05-18 DIAGNOSIS — I4891 Unspecified atrial fibrillation: Secondary | ICD-10-CM

## 2020-05-18 LAB — CBC WITH DIFFERENTIAL/PLATELET
Abs Immature Granulocytes: 0.06 10*3/uL (ref 0.00–0.07)
Basophils Absolute: 0.1 10*3/uL (ref 0.0–0.1)
Basophils Relative: 1 %
Eosinophils Absolute: 0.3 10*3/uL (ref 0.0–0.5)
Eosinophils Relative: 4 %
HCT: 26.7 % — ABNORMAL LOW (ref 39.0–52.0)
Hemoglobin: 7.9 g/dL — ABNORMAL LOW (ref 13.0–17.0)
Immature Granulocytes: 1 %
Lymphocytes Relative: 8 %
Lymphs Abs: 0.6 10*3/uL — ABNORMAL LOW (ref 0.7–4.0)
MCH: 27.2 pg (ref 26.0–34.0)
MCHC: 29.6 g/dL — ABNORMAL LOW (ref 30.0–36.0)
MCV: 92.1 fL (ref 80.0–100.0)
Monocytes Absolute: 0.4 10*3/uL (ref 0.1–1.0)
Monocytes Relative: 5 %
Neutro Abs: 6.9 10*3/uL (ref 1.7–7.7)
Neutrophils Relative %: 81 %
Platelets: 349 10*3/uL (ref 150–400)
RBC: 2.9 MIL/uL — ABNORMAL LOW (ref 4.22–5.81)
RDW: 17.2 % — ABNORMAL HIGH (ref 11.5–15.5)
WBC: 8.4 10*3/uL (ref 4.0–10.5)
nRBC: 0 % (ref 0.0–0.2)

## 2020-05-18 LAB — COMPREHENSIVE METABOLIC PANEL
ALT: 40 U/L (ref 0–44)
AST: 24 U/L (ref 15–41)
Albumin: 2 g/dL — ABNORMAL LOW (ref 3.5–5.0)
Alkaline Phosphatase: 94 U/L (ref 38–126)
Anion gap: 9 (ref 5–15)
BUN: 18 mg/dL (ref 8–23)
CO2: 26 mmol/L (ref 22–32)
Calcium: 8.6 mg/dL — ABNORMAL LOW (ref 8.9–10.3)
Chloride: 101 mmol/L (ref 98–111)
Creatinine, Ser: 1.25 mg/dL — ABNORMAL HIGH (ref 0.61–1.24)
GFR calc Af Amer: >60 mL/min (ref >60)
GFR calc non Af Amer: 59 mL/min — ABNORMAL LOW (ref >60)
Glucose, Bld: 130 mg/dL — ABNORMAL HIGH (ref 70–99)
Potassium: 4 mmol/L (ref 3.5–5.1)
Sodium: 136 mmol/L (ref 135–145)
Total Bilirubin: 1 mg/dL (ref 0.3–1.2)
Total Protein: 5.2 g/dL — ABNORMAL LOW (ref 6.5–8.1)

## 2020-05-18 LAB — GLUCOSE, CAPILLARY
Glucose-Capillary: 101 mg/dL — ABNORMAL HIGH (ref 70–99)
Glucose-Capillary: 129 mg/dL — ABNORMAL HIGH (ref 70–99)
Glucose-Capillary: 82 mg/dL (ref 70–99)
Glucose-Capillary: 138 mg/dL — ABNORMAL HIGH (ref 70–99)

## 2020-05-18 LAB — BRAIN NATRIURETIC PEPTIDE: B Natriuretic Peptide: 134.7 pg/mL — ABNORMAL HIGH (ref 0.0–100.0)

## 2020-05-18 MED ORDER — FUROSEMIDE 20 MG PO TABS: 20.0000 mg | ORAL_TABLET | Freq: Every day | ORAL | Status: DC

## 2020-05-18 MED ORDER — FUROSEMIDE 10 MG/ML IJ SOLN
40.0000 mg | Freq: Two times a day (BID) | INTRAMUSCULAR | Status: DC
  Administered 2020-05-18 – 2020-05-19 (×2): 40 mg via INTRAVENOUS
  Filled 2020-05-18 (×2): qty 4

## 2020-05-18 MED ORDER — METOPROLOL TARTRATE 12.5 MG HALF TABLET
12.5000 mg | ORAL_TABLET | Freq: Two times a day (BID) | ORAL | Status: DC
  Administered 2020-05-18 – 2020-06-04 (×31): 12.5 mg via ORAL
  Filled 2020-05-18 (×35): qty 1

## 2020-05-18 MED ORDER — SODIUM CHLORIDE 0.9% FLUSH: 10.0000 mL | INTRAVENOUS | Status: DC | PRN

## 2020-05-18 MED ORDER — ALBUMIN HUMAN 5 % IV SOLN: 12.5000 g | Freq: Once | INTRAVENOUS | Status: DC

## 2020-05-18 MED ORDER — ALBUMIN HUMAN 25 % IV SOLN
25.0000 g | Freq: Once | INTRAVENOUS | Status: AC
  Administered 2020-05-18: 25 g via INTRAVENOUS
  Filled 2020-05-18: qty 100

## 2020-05-18 NOTE — Progress Notes (Addendum)
[place] PHYSICAL MEDICINE & REHABILITATION PROGRESS NOTE  Subjective/Complaints: Patient seen sitting up in bed this morning.  He states he did not sleep well overnight, but cannot identify a particular reason why.  His wife has questions regarding lower extremity edema and hemoglobin, which was not drawn this morning.  ROS: + Shortness of breath. Denies CP, N/V/D  Objective: Vital Signs: Blood pressure 109/76, pulse 94, temperature 98.3 F (36.8 C), resp. rate 18, height 5\' 9"  (1.753 m), weight 91 kg, SpO2 99 %. No results found. No results for input(s): WBC, HGB, HCT, PLT in the last 72 hours. No results for input(s): NA, K, CL, CO2, GLUCOSE, BUN, CREATININE, CALCIUM in the last 72 hours.  Physical Exam: BP 109/76 (BP Location: Left Arm)   Pulse 94   Temp 98.3 F (36.8 C)   Resp 18   Ht 5\' 9"  (1.753 m)   Wt 91 kg   SpO2 99%   BMI 29.63 kg/m  Constitutional: No distress . Vital signs reviewed. HENT: Normocephalic.  Atraumatic. Eyes: EOMI. No discharge. Cardiovascular: No JVD.  Irregularly irregular. Respiratory: Normal effort.  No stridor.  Bilaterally clear to auscultation GI: Non-distended. Skin: Right knee with staples C/D/I Left knee with staples C/D/I, erythema along proximal >distal staple lines Psych: Normal mood.  Normal behavior. Musc: Left > right knee with edema and tenderness Bilateral lower extremity edema Neuro: Alert Motor: B/l UE: 5/5 proximal to distal Right lower extremity: Hip flexion, knee extension 3/5, ankle dorsiflexion 4+/5 Left lower extremity: Hip flexion, knee extension 2/5, ankle dorsiflexion 4+/5  Assessment/Plan: 1. Functional deficits secondary to debility due to septic knees, disciitis which require 3+ hours per day of interdisciplinary therapy in a comprehensive inpatient rehab setting.  Physiatrist is providing close team supervision and 24 hour management of active medical problems listed below.  Physiatrist and rehab team  continue to assess barriers to discharge/monitor patient progress toward functional and medical goals  Care Tool:  Bathing  Bathing activity did not occur: Refused Body parts bathed by patient: Right arm, Left arm, Chest, Abdomen, Right upper leg, Left upper leg, Face   Body parts bathed by helper: Right lower leg, Left lower leg     Bathing assist Assist Level: Minimal Assistance - Patient > 75%     Upper Body Dressing/Undressing Upper body dressing   What is the patient wearing?: Pull over shirt    Upper body assist Assist Level: Minimal Assistance - Patient > 75%    Lower Body Dressing/Undressing Lower body dressing      What is the patient wearing?: Pants     Lower body assist Assist for lower body dressing: Minimal Assistance - Patient > 75%     Toileting Toileting Toileting Activity did not occur (Clothing management and hygiene only): N/A (no void or bm)  Toileting assist Assist for toileting: Supervision/Verbal cueing     Transfers Chair/bed transfer  Transfers assist     Chair/bed transfer assist level: Minimal Assistance - Patient > 75%     Locomotion Ambulation   Ambulation assist      Assist level: 2 helpers Assistive device: Walker-platform Max distance: 30   Walk 10 feet activity   Assist     Assist level: 2 helpers Assistive device: Walker-platform   Walk 50 feet activity   Assist Walk 50 feet with 2 turns activity did not occur: Safety/medical concerns         Walk 150 feet activity   Assist Walk 150 feet activity did  not occur: Safety/medical concerns         Walk 10 feet on uneven surface  activity   Assist Walk 10 feet on uneven surfaces activity did not occur: Safety/medical concerns         Wheelchair     Assist Will patient use wheelchair at discharge?: No             Wheelchair 50 feet with 2 turns activity    Assist            Wheelchair 150 feet activity     Assist             Medical Problem List and Plan: 1.  Decreased functional mobility secondary to sepsis/Enterococcus faecalis bacteremia/discitis/osteomyelitis of L4-5 status post aortic root replacement 04/17/2020 status post cardioversion for atrial fibrillation.  Sternal precautions/lumbar corset for comfort x 4 more weeks  Continue CIR 2.  Antithrombotics: -DVT/anticoagulation: Resumed Eliquis 05/15/2020             -antiplatelet therapy: N/A 3. Pain Management: OxyContin sustained release 10 mg every 12 hours, Lyrica 100 mg 3 times daily Flexeril as as well as oxycodone needed  Relatively controlled on 6/28 4. Mood: N/A meds- follow for ego support             -antipsychotic agents: N/A 5. Neuropsych: This patient is capable of making decisions on his own behalf. 6. Skin/Wound Care: Routine skin checks 7. Fluids/Electrolytes/Nutrition: Routine in and outs. 8.  ID/Enterococcus bacteremia.  Continue IV ampicillin 2 g every 4 hours as well as Rocephin 2 g every 12 hours through 6/30.  Follow-up infectious disease 9.  Acute on chronic anemia.  Continue iron supplement.    Hb 8.4 on 6/25, labs pending  Cont to monitor 10.  Bilateral septic knees.  Status post total synovectomy irrigation debridement bilateral knees 05/02/2020 per Dr. Sharol Given.  Weightbearing as tolerated.   Continue ice to bilateral knees  11.  AKI.     Creatinine 1.22 on 6/25, labs ordered for Monday 12.  PAF.  Follow-up cardiology services.  Amiodarone as directed.  Lopressor 12.5 mg twice daily.    Monitor with increased exertion  ECG on 6/28 reviewed, showing A. fib with RVR, likely same on 6/28  Will repeat ECG and discussed with cards 13.  Diabetes mellitus with hyperglycemia.  Latest hemoglobin A1c 6.8.  Lantus insulin 10 units twice daily.   CBG (last 3)  Recent Labs    05/17/20 1616 05/17/20 1927 05/18/20 0603  GLUCAP 90 143* 101*    Slightly labile on 6/28  Monitor with increased mobility 14.  Recent C3-4 ACDF  03/02/2020 per [name] for cervical myelopathy.  Cervical collar discontinued 15.  Hypothyroidism.  Synthroid 16. Drug-induced constipation.  MiraLAX daily, Senokot nightly  Adjust bowel meds as necessary 17.  Severe hypoalbuminemia  Supplement initiated on 6/25 18.  Transaminitis  LFTs elevated on 6/25, labs pending 19.  History of prolonged QTC  Not noted on most recent ECG, repeat ECG ordered 20.  Peripheral edema  See #12, #19  QTC on most recent ECG within acceptable range, will order Lasix daily  Echo reviewed from 6/9-showing normal EF  BNP ordered  LOS: 4 days A FACE TO FACE EVALUATION WAS PERFORMED  Isabellamarie Randa Lorie Phenix 05/18/2020, 8:49 AM

## 2020-05-18 NOTE — Consult Note (Signed)
Cardiology Consultation:   Patient ID: Johnathan [facility] MRN: [id number]; DOB: 06-14-1951  Admit date: 05/14/2020 Date of Consult: 05/18/2020  Primary Care Provider: Nicoletta Dress, MD [facility] Cardiologist: Johnathan Lindau, MD  [facility] Electrophysiologist:  None    Patient Profile:   Johnathan Murray is a 70 y.o. 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 now s/p debridement of aortic root abscess and aortic root replacement on 04/16/2020, paroxysmal atrial fibrillation on Eliquis,recurrent bilateral knee effusions and septic arthritis with arthrocentesis (6/10) ,hypertension, dyslipidemia, diabetes on insulin, hypothyroidism, lumbar stenosis with neurogenic claudication, and cervical decompression in 3/2021and a fib in rehab and back to main [facility] at [facility] for amiodarone and thought to repeat DCCV in future if a fib persists and infection improves (this was written 02/05/20) who is being seen today for the evaluation of a fib with RVR  at the request of [facility].  History of Present Illness:   [facility] with above complex hx Patient underwent stress test in 12/2019 as part of pre-op evaluation for upcoming neck surgery. Stress test came back abnormal with a defect in basal anterolateral and mid anterolateral location. Therefore, she was referred for cardiac catheterization. He underwent left heart catheterization on 01/21/2020 which showed patent LIMA to LAD but occluded SVG to OM and SVG to Diag.Patient felt to be at acceptable risk for surgery. Patient underwent C3-C4 anterior cervical decompression and fusion for treatment of his compressive cervical myelopathy on 03/02/2020. He tolerated the procedure well with no immediate complication and was discharged the next day. However, she then developed  progressive weakness, increasingbackpain, and poor appetite and presented back to the ED on 03/19/2020 where she was found to be in AKI with creatinine of 6.40 on presentation (baseline 1.3 to 1.6). She was admitted for sepsis likely secondary to Enterococcus faecalis bacteremia but was also found to have acute osteomyelitis discitis of L4-L5 and likely left lower extremity septic knee. TEE during admission showed no obvious vegetation but increased transaortic gradient across the bioprosthetic valve. Hhe was treated with IV antibiotics. Neurosurgery was consulted for acute osteomyelitis discitis and recommended disc aspiration by IR which was performed on 04/07/2020. He was also noted to transaminitis on admission which slowly improved throughout admission. This was felt to be secondary to sepsis/hypotension/shock liver. Hepatitis panel non-reactive. Amiodarone was discontinued. Patient was discharged on 04/07/2020 to CIR. We were consulted the next day on 04/08/2020 for atrial flutter with RVR. He was restarted on Amiodarone and then underwent successful TEE/DCCV on 04/13/2020. 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 [facility] performed debridement of aortic root abscess/pseudoaneurysm and aortic root replacement with a 85mm homograft aortic valve/root reimplantation of coronary button to homograft on 04/16/2020. He was noted to be back in atrial fibrillation on 04/27/2020 and underwent repeat TEE/DCCV on 04/29/2020 which was successful. Cardiology signed off on 04/30/2020 and patient was discharged back to CIR. Patient was then taken back to the OR on 05/02/2020 for total synovectomy of both knees given recurrent bilateral knee effusions. Following procedure, patient hypotensive with systolic BP's in the 47'S and was back in atrial fibrillation. Patient hemoglobin noted to be 5.6 so he was transfused 3 units of PRBCs. Patient was readmitted as  inpatient and Cardiology again consulted.  Was in a fib with discharge to rehab.  Plan was rate control until acute illnesses resolved.  HR in a fib was stable prior to discharge back to Rehab.    (Significant events: 4/29-5/18>>admit by [facility] for sepsis secondary to Enterococcus faecalis, found to have acute osteo/discitis of L/-/5, left knee septic arthritis-discharge to CIR  5/18-5/24>> at CIR-aortic root abscess/prosthetic aortic valve vegetation on TEE (done for cardioversion) 5/24-6/10>> admit by [facility] from CIR-for aortic root replacement-discharged back to CIR. TEE DCCV 04/29/20 that was successful but then back into a fib  6/10-6/12>> at CIR- recurrent bilateral knee septic arthritis on arthrocentesis-ortho re-consulted 6/12-6/24>>admit to [facility] for A. fib with RVR, severe anemia following bilateral knee washout for septic arthritis. Significant postoperative bleeding from left knee on 24th maintained in a fib with rate control.)  Now on rehab since the 24th and HR per VS chart 104 to 90s .  Per MD notes HR is elevated.   EKG:  The EKG was personally reviewed and demonstrates:  05/15/20 with a fib HR 105 and incomplete LBBB and no acute changes, rate slower than EKG 05/03/20  Today's EKG with HR 119 and QTc appears longer due to measuring flutter beat in T wave.  No acute ST changes Telemetry:  Telemetry was personally reviewed and demonstrates:  No tele.   BP 112/80 P 91 and resp 18 afebrile Last labs 05/15/20 Na 137, K+ 4.1 BUN 18, Cr 1.22 WBC 9.3 Hgb 8.4 plts 417  LABS pending BNP, CBC, CMP     Past Medical History:  Diagnosis Date  . Abnormal LFTs (liver function tests) 10/31/2016  . Abnormal nuclear stress test 05/03/2018  . Acid reflux 02/15/2018   not at present  . Acute otitis externa of left ear 11/13/2019  . Acute suppurative otitis media of left ear without spontaneous rupture of tympanic membrane 11/13/2019  . Anemia    low iron  . Aortic stenosis 05/03/2018  . Aortic valve  abscess 04/13/2020  . ARF (acute renal failure) ([place]) 03/20/2020  . Arthritis 02/15/2018   Overview:  shoulder  . Bacteremia due to Enterococcus 03/21/2020  . Cervical myelopathy ([place]) 04/22/2019  . Conductive hearing loss of left ear 11/13/2019  . Coronary artery disease involving native coronary artery of native heart without angina pectoris 05/30/2016  . Coronary artery disease of native artery of native heart with stable angina pectoris ([place])    pt. denies  . Diabetes mellitus due to underlying condition with unspecified complications ([place]) 1/61/0960  . Discitis 04/07/2020  . Dyslipidemia 05/30/2016  . Dyspnea    upon exertion,  as of 06/05/09 not having any recent chest pain  . Dysrhythmia    a fib  . Endocarditis 04/13/2020  . Essential hypertension 05/30/2016  . Heart murmur    Has had an Aortic valve replacement  . History of fusion of cervical spine 03/23/2020  . Hyperactive gag reflex 02/15/2018  . Hypothyroidism   . Intermittent claudication ([place]) 10/31/2018  . Lumbar stenosis with neurogenic claudication 06/11/2019  . Macular degeneration 02/15/2018  . PAF (paroxysmal atrial fibrillation) ([place]) 05/30/2016  . Pre-operative cardiovascular examination 01/03/2020  . Preoperative cardiovascular examination 04/17/2019  . Prosthetic valve endocarditis ([place]) 04/13/2020  . Protein-calorie malnutrition, severe 03/24/2020  . Pseudogout of left knee 03/21/2020  . Renal insufficiency 01/17/2020  . S/P aortic valve replacement 01/17/2020  . S/P AVR 01/17/2020  . Septic arthritis ([place]) 03/23/2020  . Tinnitus of left ear 11/13/2019  . Typical atrial flutter ([place])  Past Surgical History:  Procedure Laterality Date  . ANTERIOR CERVICAL DECOMP/DISCECTOMY FUSION N/A 04/22/2019   Procedure: Anterior Cervical Discectomy and Fusion, Cervical four-five, Cervical five-six;  Surgeon: Earnie Larsson, MD;  Location: [place];  Service: Neurosurgery;  Laterality: N/A;  . ANTERIOR CERVICAL DECOMP/DISCECTOMY FUSION N/A  03/02/2020   Procedure: Anterior Cervical Decompression Fusion  - Cervical three-Cervical four;  Surgeon: Earnie Larsson, MD;  Location: [place];  Service: Neurosurgery;  Laterality: N/A;  . AORTIC VALVE REPLACEMENT    . ASCENDING AORTIC ROOT REPLACEMENT N/A 04/16/2020   Procedure: ASCENDING AORTIC ROOT REPLACEMENT - HOMOGRAFT using [place] 23 MM Cryo Aortic Valve.;  Surgeon: Gaye Pollack, MD;  Location: [facility];  Service: Open Heart Surgery;  Laterality: N/A;  . CARDIAC CATHETERIZATION     several stents  . CARDIAC SURGERY    . CARDIOVERSION N/A 04/13/2020   Procedure: CARDIOVERSION;  Surgeon: Geralynn Rile, MD;  Location: [place];  Service: Cardiovascular;  Laterality: N/A;  . CARDIOVERSION N/A 04/29/2020   Procedure: CARDIOVERSION;  Surgeon: Skeet Latch, MD;  Location: [facility];  Service: Cardiovascular;  Laterality: N/A;  . CORONARY ARTERY BYPASS GRAFT  2017   patient unsure how many, had at [facility]  . HAND SURGERY    . I & D EXTREMITY Bilateral 05/02/2020   Procedure: IRRIGATION AND DEBRIDEMENT BILATERAL KNEES;  Surgeon: Newt Minion, MD;  Location: [place];  Service: Orthopedics;  Laterality: Bilateral;  . IR LUMBAR DISC ASPIRATION W/IMG GUIDE  04/07/2020  . IRRIGATION AND DEBRIDEMENT KNEE Bilateral 03/24/2020   Procedure: ARTHROSCOPY IRRIGATION AND DEBRIDEMENT BILATERAL  KNEE;  Surgeon: Marchia Bond, MD;  Location: [place];  Service: Orthopedics;  Laterality: Bilateral;  . KNEE SURGERY    . LEFT HEART CATH AND CORONARY ANGIOGRAPHY N/A 01/21/2020   Procedure: LEFT HEART CATH AND CORONARY ANGIOGRAPHY;  Surgeon: Belva Crome, MD;  Location: [place] CV LAB;  Service: Cardiovascular;  Laterality: N/A;  . LEFT HEART CATH AND CORS/GRAFTS ANGIOGRAPHY N/A 05/16/2018   Procedure: LEFT HEART CATH AND CORS/GRAFTS ANGIOGRAPHY;  Surgeon: Burnell Blanks, MD;  Location: [place] CV LAB;  Service: Cardiovascular;  Laterality: N/A;  . LUMBAR  LAMINECTOMY/DECOMPRESSION MICRODISCECTOMY Bilateral 06/11/2019   Procedure: Bilateral Lumbar Four-FiveLaminectomy/Foraminotomy;  Surgeon: Earnie Larsson, MD;  Location: [place];  Service: Neurosurgery;  Laterality: Bilateral;  posterior  . SYNOVECTOMY Bilateral 05/02/2020   Procedure: SYNOVECTOMY;  Surgeon: Newt Minion, MD;  Location: [place];  Service: Orthopedics;  Laterality: Bilateral;  . TEE WITHOUT CARDIOVERSION N/A 03/26/2020   Procedure: TRANSESOPHAGEAL ECHOCARDIOGRAM (TEE);  Surgeon: Jerline Pain, MD;  Location: [facility] ENDOSCOPY;  Service: Cardiovascular;  Laterality: N/A;  . TEE WITHOUT CARDIOVERSION N/A 04/13/2020   Procedure: TRANSESOPHAGEAL ECHOCARDIOGRAM (TEE);  Surgeon: Geralynn Rile, MD;  Location: [place];  Service: Cardiovascular;  Laterality: N/A;  . TEE WITHOUT CARDIOVERSION N/A 04/16/2020   Procedure: TRANSESOPHAGEAL ECHOCARDIOGRAM (TEE);  Surgeon: Gaye Pollack, MD;  Location: [place];  Service: Open Heart Surgery;  Laterality: N/A;  . TEE WITHOUT CARDIOVERSION N/A 04/29/2020   Procedure: TRANSESOPHAGEAL ECHOCARDIOGRAM (TEE);  Surgeon: Skeet Latch, MD;  Location: [place];  Service: Cardiovascular;  Laterality: N/A;     Home Medications:  Prior to Admission medications   Medication Sig Start Date End Date Taking? Authorizing Provider  amiodarone (PACERONE) 200 MG tablet Take 1 tablet (200 mg total) by mouth 2 (two) times daily. 05/14/20   Thurnell Lose, MD  amiodarone (PACERONE) 200 MG tablet  Take 1 tablet (200 mg total) by mouth daily. 05/24/20   Thurnell Lose, MD  amoxicillin (AMOXIL) 500 MG capsule Take 1 capsule (500 mg total) by mouth 2 (two) times daily. 05/21/20 09/18/20  Jonetta Osgood, MD  aspirin EC 81 MG EC tablet Take 1 tablet (81 mg total) by mouth daily. Patient not taking: Reported on 05/03/2020 05/01/20   Barrett, Erin R, PA-C  benazepril (LOTENSIN) 40 MG tablet Take 40 mg by mouth at bedtime.    [provider]  cefTRIAXone 2 g in  sodium chloride 0.9 % 100 mL Inject 2 g into the vein every 12 (twelve) hours. 05/14/20   Thurnell Lose, MD  Coenzyme Q10 (CO Q10) 200 MG CAPS Take 200 mg by mouth daily.    [provider]  ezetimibe (ZETIA) 10 MG tablet Take 10 mg by mouth at bedtime.    [provider]  ferrous sulfate 325 (65 FE) MG tablet Take 325 mg by mouth daily with breakfast.    [provider]  furosemide (LASIX) 40 MG tablet Take 40 mg by mouth at bedtime.    [provider]  insulin aspart (NOVOLOG) 100 UNIT/ML injection Substitute to any brand approved.Before each meal 3 times a day, 140-199 - 2 units, 200-250 - 4 units, 251-299 - 6 units,  300-349 - 8 units,  350 or above 10 units. Dispense syringes and needles as needed, Ok to switch to PEN if approved. DX DM2, Code E11.65 05/14/20   Thurnell Lose, MD  insulin glargine (LANTUS) 100 UNIT/ML injection Inject 0.1 mLs (10 Units total) into the skin 2 (two) times daily. Patient not taking: Reported on 05/03/2020 04/13/20   Angiulli, Lavon Paganini, PA-C  levothyroxine (SYNTHROID) 25 MCG tablet Take 25 mcg by mouth daily before breakfast.  01/23/20   [provider]  metoprolol tartrate (LOPRESSOR) 25 MG tablet Take 1 tablet (25 mg total) by mouth 2 (two) times daily. Patient not taking: Reported on 05/03/2020 04/30/20   Barrett, Lodema Hong, PA-C  oxyCODONE (OXYCONTIN) 10 mg 12 hr tablet Take 1 tablet (10 mg total) by mouth every 12 (twelve) hours. Patient not taking: Reported on 05/03/2020 04/13/20   Angiulli, Lavon Paganini, PA-C  pregabalin (LYRICA) 100 MG capsule Take 1 capsule (100 mg total) by mouth 3 (three) times daily. Patient not taking: Reported on 05/03/2020 04/13/20   Angiulli, Lavon Paganini, PA-C  senna (SENOKOT) 8.6 MG TABS tablet Take 1 tablet (8.6 mg total) by mouth at bedtime. Patient not taking: Reported on 05/03/2020 04/07/20   Alma Friendly, MD  vitamin B-12 (CYANOCOBALAMIN) 1000 MCG tablet Take 1,000 mcg by mouth daily.     [provider]  vitamin C (ASCORBIC ACID) 500 MG tablet Take 500 mg by mouth daily.     [provider]  zinc gluconate 50 MG tablet Take 50 mg by mouth daily.    [provider]    Inpatient Medications: Scheduled Meds: . amiodarone  200 mg Oral BID   Followed by  . [START ON 05/24/2020] amiodarone  200 mg Oral Daily  . [START ON 05/21/2020] amoxicillin  500 mg Oral Q12H  . apixaban  5 mg Oral BID  . Chlorhexidine Gluconate Cloth  6 each Topical Daily  . feeding supplement (ENSURE ENLIVE)  237 mL Oral TID BM  . feeding supplement (PRO-STAT SUGAR FREE 64)  30 mL Oral BID  . ferrous [id number] C-folic acid  1 capsule Oral Q breakfast  . ferrous  sulfate  325 mg Oral BID WC  . furosemide  20 mg Oral Daily  . insulin aspart  0-20 Units Subcutaneous TID WC  . insulin glargine  10 Units Subcutaneous BID  . levothyroxine  25 mcg Oral QAC breakfast  . oxyCODONE  10 mg Oral Q12H  . polyethylene glycol  17 g Oral Daily  . pregabalin  100 mg Oral TID  . senna  1 tablet Oral QHS   Continuous Infusions: . ampicillin (OMNIPEN) IV 2 g (05/18/20 0606)  . cefTRIAXone (ROCEPHIN) IVPB 2 gram/100 mL NS (Mini-Bag Plus) 2 g (05/18/20 0526)   PRN Meds: acetaminophen **OR** acetaminophen, albuterol, cyclobenzaprine, oxyCODONE, sodium chloride flush, sorbitol  Allergies:    Allergies  Allergen Reactions  . Atorvastatin Other (See Comments)    Urinary retention   . Nebivolol Diarrhea    Social History:   Social History   Socioeconomic History  . Marital status: Married    Spouse name: [name]  . Number of children: 1  . Years of education: Not on file  . Highest education level: GED or equivalent  Occupational History    Employer: [employer]  Tobacco Use  . Smoking status: Never Smoker  . Smokeless tobacco: Never Used  Vaping Use  . Vaping Use: Never used  Substance and Sexual Activity  . Alcohol use: Not Currently  . Drug use: Never  .  Sexual activity: Not Currently  Other Topics Concern  . Not on file  Social History Narrative   He drives a truck for [employer].   He lives with wife in a one-level home.   Highest level of education:  GED      Patient is right-handed. There are 8 steps to enter his home.   Social Determinants of Health   Financial Resource Strain:   . Difficulty of Paying Living Expenses:   Food Insecurity:   . Worried About Charity fundraiser in the Last Year:   . Arboriculturist in the Last Year:   Transportation Needs:   . Film/video editor (Medical):   Marland Kitchen Lack of Transportation (Non-Medical):   Physical Activity:   . Days of Exercise per Week:   . Minutes of Exercise per Session:   Stress:   . Feeling of Stress :   Social Connections:   . Frequency of Communication with Friends and Family:   . Frequency of Social Gatherings with Friends and Family:   . Attends Religious Services:   . Active Member of Clubs or Organizations:   . Attends Archivist Meetings:   Marland Kitchen Marital Status:   Intimate Partner Violence:   . Fear of Current or Ex-Partner:   . Emotionally Abused:   Marland Kitchen Physically Abused:   . Sexually Abused:     Family History:    Family History  Problem Relation Age of Onset  . Prostate cancer Brother   . Heart disease Maternal Grandmother   . Brain cancer Mother      ROS:  Please see the history of present illness.  General:no colds or fevers, no weight changes Skin:no rashes or ulcers HEENT:no blurred vision, no congestion CV:see HPI PUL:see HPI GI:no diarrhea constipation or melena, no indigestion GU:no hematuria, no dysuria MS:no joint pain, no claudication Neuro:no syncope, no lightheadedness Endo:+ diabetes, + thyroid disease  All other ROS reviewed and negative.     Physical Exam/Data:   Vitals:   05/17/20 1343 05/17/20 2100 05/18/20 0532 05/18/20 0800  BP: 116/74  112/80 108/86 109/76  Pulse: (!) 103 91 98 94  Resp: 18 18 18    Temp:  98.5 F (36.9 C) 98.7 F (37.1 C) 98.3 F (36.8 C)   TempSrc:  Oral    SpO2: 100% 100% 99%   Weight:      Height:        Intake/Output Summary (Last 24 hours) at 05/18/2020 1037 Last data filed at 05/18/2020 0858 Gross per 24 hour  Intake 120 ml  Output 900 ml  Net -780 ml   Last 3 Weights 05/14/2020 05/14/2020 05/13/2020  Weight (lbs) 200 lb 9.9 oz 198 lb 6.6 oz 199 lb 11.8 oz  Weight (kg) 91 kg 90 kg 90.6 kg     Body mass index is 29.63 kg/m.  Per Dr. Debara Pickett General:  Well nourished, well developed, in no acute distress HEENT: normal Lymph: no adenopathy Neck: no JVD Endocrine:  No thryomegaly Vascular: No carotid bruits;  Cardiac:  irreg irreg ; no murmur gallup rub or click Lungs:  clear to auscultation bilaterally, no wheezing, rhonchi or rales  Abd: soft, nontender, no hepatomegaly  Ext: ++ edema to hips Musculoskeletal:  No deformities, BUE and BLE strength normal and equal Skin: warm and dry  Neuro:  Alert and oriented X 3 MAE follows commands, no focal abnormalities noted Psych:  Normal affect   Relevant CV Studies: LAST Echo  TEE and DCCV 2020/05/01  IMPRESSIONS    1. Left ventricular ejection fraction, by estimation, is 55 to 60%. The  left ventricle has normal function. The left ventricle has no regional  wall motion abnormalities.  2. Right ventricular systolic function is mildly reduced. The right  ventricular size is normal.  3. No left atrial/left atrial appendage thrombus was detected. The LAA  emptying velocity was 50 cm/s.  4. Multiple regurgitant jets each with mild regurgitation. The mitral  valve is normal in structure. Mild to moderate mitral valve regurgitation.  No evidence of mitral stenosis.  5. Tricuspid valve regurgitation is mild to moderate.  6. The aortic valve has been repaired/replaced. Aortic valve  regurgitation is trivial. No aortic stenosis is present. There is a 23 mm  Homograft valve/aortic root valve present in the aortic  position.  Procedure Date: 04/16/2020. Echo findings are  consistent with normal structure and function of the aortic valve  prosthesis.  7. The inferior vena cava is normal in size with greater than 50%  respiratory variability, suggesting right atrial pressure of 3 mmHg.   Conclusion(s)/Recommendation(s): Normal biventricular function without  evidence of hemodynamically significant valvular heart disease.   FINDINGS  Left Ventricle: Left ventricular ejection fraction, by estimation, is 55  to 60%. The left ventricle has normal function. The left ventricle has no  regional wall motion abnormalities. The left ventricular internal cavity  size was normal in size. There is  no left ventricular hypertrophy.   Right Ventricle: The right ventricular size is normal. No increase in  right ventricular wall thickness. Right ventricular systolic function is  mildly reduced.   Left Atrium: Left atrial size was normal in size. No left atrial/left  atrial appendage thrombus was detected. The LAA emptying velocity was 50  cm/s.   Right Atrium: Right atrial size was normal in size.   Pericardium: There is no evidence of pericardial effusion.   Mitral Valve: Multiple regurgitant jets each with mild regurgitation. The  mitral valve is normal in structure. Normal mobility of the mitral valve  leaflets. Mild to moderate mitral valve regurgitation.  No evidence of  mitral valve stenosis.   Tricuspid Valve: The tricuspid valve is normal in structure. Tricuspid  valve regurgitation is mild to moderate. No evidence of tricuspid  stenosis.   Aortic Valve: No perivalvular regurgitation or abscess. The aortic valve  has been repaired/replaced. Aortic valve regurgitation is trivial. No  aortic stenosis is present. There is a 23 mm Homograft valve/aortic root  valve present in the aortic position.  Procedure Date: 04/16/2020. Echo findings are consistent with normal  structure and function of the  aortic valve prosthesis.   Pulmonic Valve: The pulmonic valve was normal in structure. Pulmonic valve  regurgitation is trivial. No evidence of pulmonic stenosis.   Aorta: The aortic root is normal in size and structure. There is minimal  (Grade I) plaque involving the descending aorta.   Venous: The inferior vena cava is normal in size with greater than 50%  respiratory variability, suggesting right atrial pressure of 3 mmHg.   IAS/Shunts: No atrial level shunt detected by color flow Doppler. There is  no evidence of a patent foramen ovale. There is no evidence of an atrial  septal defect.     MR Johnathan grad:  75.3 mmHg  TRICUSPID VALVE  MR Mean grad:  42.0 mmHg  TR Johnathan grad:  18.1 mmHg  MR Vmax:     434.00 cm/s TR Vmax:    213.00 cm/s  MR Vmean:    296.0 cm/s  MR PISA:     1.01 cm  MR PISA Eff ROA: 9 mm  MR PISA Radius: 0.40 cm   Laboratory Data:  High Sensitivity Troponin:  No results for input(s): TROPONINIHS in the last 720 hours.   Chemistry Recent Labs  Lab 05/13/20 0527 05/14/20 0446 05/15/20 0629  NA 135 136 137  K 3.9 4.0 4.1  CL 100 100 101  CO2 26 26 27   GLUCOSE 128* 126* 115*  BUN 19 17 18   CREATININE 1.25* 1.20 1.22  CALCIUM 8.3* 8.4* 8.5*  GFRNONAA 59* >60 >60  GFRAA >60 >60 >60  ANIONGAP 9 10 9     Recent Labs  Lab 05/12/20 0340 05/13/20 0527 05/15/20 0629  PROT 5.1* 5.0* 5.3*  ALBUMIN 1.8* 1.8* 2.0*  AST 32 46* 34  ALT 41 59* 61*  ALKPHOS 90 90 104  BILITOT 1.2 0.8 0.9   Hematology Recent Labs  Lab 05/13/20 0527 05/14/20 0446 05/15/20 0629  WBC 8.8 8.6 9.3  RBC 2.80* 3.00* 3.02*  HGB 7.8* 8.4* 8.4*  HCT 25.7* 27.8* 27.6*  MCV 91.8 92.7 91.4  MCH 27.9 28.0 27.8  MCHC 30.4 30.2 30.4  RDW 17.6* 17.4* 17.3*  PLT 391 417* 417*   BNP Recent Labs  Lab 05/12/20 0340 05/13/20 0527 05/14/20 0446  BNP 133.4* 146.7* 135.1*    DDimer No results for input(s): DDIMER in the last 168  hours.   Radiology/Studies:  No results found.      NO CHEST PAIN  Assessment and Plan:   1. Persistent atrial fib now with higher rate.  Labs pending for underlying cause of tachycardia such as anemia, infection - volume overload, plan has been to continue in a fib for now and plan DCCV once he has recovered from acute illness.   He is on amiodarone 200 mg BID and I do not see his metoprolol 25 mg BID that he was discharged on.  Will resume at 12.5 BID though may need higher dose.  HGb down slightly, WBC WNL   On amiodarone  2. Anticoagulation on eliquis.  5 mg BID   3. Anemia with hgb 8.4 on last check 4. Edema with low albumin, and on 2 po supplements. May need IV lasix and albumin defer to Dr. Debara Pickett.   5. Recent AKI labs pending.        For questions or updates, please contact [place] Please consult [email or web address] for contact info under    Signed, Cecilie Kicks, NP  05/18/2020 10:37 AM

## 2020-05-18 NOTE — Progress Notes (Signed)
Occupational Therapy Session Note  Patient Details  Name: Johnathan Murray MRN: [id number] Date of Birth: [birth year]  Today's Date: 05/18/2020 OT Individual Time: 2707-8675 OT Individual Time Calculation (min): 71 min    Short Term Goals: Week 1:  OT Short Term Goal 1 (Week 1): Pt will don pants with modA and LRAD PRN. OT Short Term Goal 2 (Week 1): Pt will complete toilet transfer to [facility] with modA. OT Short Term Goal 3 (Week 1): Pt will adhere to sternal precautions with min verbal cues during ADL task. OT Short Term Goal 4 (Week 1): Pt will complete bathing at sit > stand level with mod assist of one caregiver  Skilled Therapeutic Interventions/Progress Updates:    patient in bed, alert, wife present, he states that he is not feeling well today - pain in lower back is a "5".  Nurse provided pain meds prior to start of this session.  Moderate edema noted bilateral LEs.  Supine to sitting edge of bed with mod A - cues for sternal precautions.  Max A to donn LSO.  Sit to stand at edge of bed mod A of one, SPT with RW bed to w/c mod A of 2 with increased time - noted increased pain in lower back with stand to sit.  UB bathing and dressing w/c level - set up for UB bathing min A for OH shirt.  He declined changing pants and LB bathing at this time.  Dependent for w.c mobility to therapy gym  Completed UB ergometer without resistance 5 minutes -   Returned to room via w/c - sit to stand from w/c mod A of 2, SPT to bed mod A of 2.  Sitting to side lying to supine mod A of 2.  Completed bed level UB and LB light ROM activities and DBT for relaxation.  He remained in bed at close of session. Bed alarm set and call bell in reach  Therapy Documentation Precautions:  Precautions Precautions: Sternal, Fall Precaution Comments: freq cues to adhere to sternal prec Required Braces or Orthoses: Spinal Brace Spinal Brace: Lumbar corset Other Brace: for comfort, pt reports "it doesn't really help me" so  we didn't use back brace Restrictions Weight Bearing Restrictions: No Other Position/Activity Restrictions: sternal precautions   Therapy/Group: Individual Therapy  Carlos Levering 05/18/2020, 7:48 AM

## 2020-05-18 NOTE — Progress Notes (Signed)
Physical Therapy Session Note  Patient Details  Name: Johnathan Murray MRN: [id number] Date of Birth: 1951/08/22  Today's Date: 05/18/2020 PT Individual Time: 5300-5110 PT Individual Time Calculation (min): 53 min   Short Term Goals: Week 1:  PT Short Term Goal 1 (Week 1): pt will perform supine to sit w/cga and adhere to precautions PT Short Term Goal 2 (Week 1): Pt will perform STS w/min assist PT Short Term Goal 3 (Week 1): pt will perform bed to chair w/min assist PT Short Term Goal 4 (Week 1): Pt will ambulate 75ft w/LRAD and mod assist of 1  Skilled Therapeutic Interventions/Progress Updates:   Received pt supine in bed, pt agreeable to therapy after convincing, and reported intense low back pain but did not state pain level (premedicated). Pt's wife present at bedside. Repositioning, rest breaks, and distraction done to reduce pain levels. Pt declined OOB mobility due to high pain levels and initially declined bed level exercises but agreed after extensive convincing from family and therapist. Session with emphasis on generalized strengthening, pain management strategies, and improved activity tolerance. Therapist discussed with RN location for K-pad for pt's low back pain and RN reported it has been ordered. NT present during session to take vitals and RN present to administer IV medications. Pt performed the following exercises with supervision, increased time, and verbal cues for technique: -hip adduction pillow squeezes 2x10 -heel slides x10 bilaterally  -ankle circles x20 clockwise/counterclockwise  -hip abduction 2x10 bilaterally  -SAQ 2x10 bilaterally  Pt required multiple rest breaks throughout session due to increased fatigue. Concluded session with pt supine in bed, needs within reach, and bed alarm on. Pt's wife present at bedside. Therapist assisted with repositioning for comfort.   Therapy Documentation Precautions:  Precautions Precautions: Sternal, Fall Precaution  Comments: freq cues to adhere to sternal prec Required Braces or Orthoses: Spinal Brace Spinal Brace: Lumbar corset Other Brace: for comfort, pt reports "it doesn't really help me" so we didn't use back brace Restrictions Weight Bearing Restrictions: No Other Position/Activity Restrictions: sternal precautions   Therapy/Group: Individual Therapy [place] PT, DPT   05/18/2020, 2:55 PM

## 2020-05-18 NOTE — Progress Notes (Signed)
Occupational Therapy Session Note  Patient Details  Name: Johnathan Murray MRN: [id number] Date of Birth: Oct 31, 1951  Today's Date: 05/18/2020 OT Individual Time: 7322-0254 OT Individual Time Calculation (min): 53 min    Short Term Goals: Week 1:  OT Short Term Goal 1 (Week 1): Pt will don pants with modA and LRAD PRN. OT Short Term Goal 2 (Week 1): Pt will complete toilet transfer to [facility] with modA. OT Short Term Goal 3 (Week 1): Pt will adhere to sternal precautions with min verbal cues during ADL task. OT Short Term Goal 4 (Week 1): Pt will complete bathing at sit > stand level with mod assist of one caregiver  Skilled Therapeutic Interventions/Progress Updates:    Treatment session with focus on pain management, sit <> stands and static standing balance to improve power up and standing balance for LB dressing and bathing, and activity endurance to improve endurance for ADL tasks. Pt received semi-reclined in bed with [name] (wife) present. Completed supine to sit with modA to advance legs to EOB and multimodal cues to maintain precautions. Pt reported "I better not pull on this (bed rail) while y'all are here to yell at me." demonstrating good understanding of precautions but poor carryover. Completed three sit <> stands with RW with maxA +2 to block RW, facilitate anterior weight shift, and assist with power up with raised bed due to sternal precautions and knee and back pain. Facilitated multimodal cues to shift hips to EOB before standing. Pt weight shifting to R side, utilized multimodal cues to facilitate equal weight shift on BLE. Pt with increased pain upon straightening posture in standing. Pt sat at EOB with OTS present; therapist discussed altering pain management schedule with PA to improve pain management during therapy sessions. PA present to educate patient and provide additional encouragement. Applied ice packs to BLE at knees to provide additional pain management and reduce  swelling in the knees. Educated pt on purpose of and use of k pad for back pain management with physician and ended session with pt semi-reclined in bed with bed alarm on and all needs within reach.  Therapy Documentation Precautions:  Precautions Precautions: Sternal, Fall Precaution Comments: freq cues to adhere to sternal prec Required Braces or Orthoses: Spinal Brace Spinal Brace: Lumbar corset Other Brace: for comfort, pt reports "it doesn't really help me" so we didn't use back brace Restrictions Weight Bearing Restrictions: No Other Position/Activity Restrictions: sternal precautions  Pain: Reported unrated back pain limiting participation in therapy, RN and PA notified.  Therapy/Group: Individual Therapy  Michelle Nasuti 05/18/2020, 12:08 PM

## 2020-05-18 NOTE — Care Management (Signed)
Patient ID: Johnathan Murray, male   DOB: 1951/03/25, 69 y.o.   MRN: [id number] Met with patient and family to review role of CM to review nursing issues and collaboration with SW Margreta Journey) re: facilitate prep for discharge. Discussed IV abx due to be changed to oral mode 05/20/20. Reported MD changing up meds some. Reviewed skin; stage 2 "getting better". Constipation is managed with current regimen. No other concerns noted at present. Continue to follow along with patient case to review nursing concerns.

## 2020-05-19 ENCOUNTER — Inpatient Hospital Stay (HOSPITAL_COMMUNITY): Payer: BC Managed Care – PPO | Admitting: Occupational Therapy

## 2020-05-19 ENCOUNTER — Inpatient Hospital Stay (HOSPITAL_COMMUNITY): Payer: BC Managed Care – PPO

## 2020-05-19 DIAGNOSIS — E440 Moderate protein-calorie malnutrition: Secondary | ICD-10-CM

## 2020-05-19 DIAGNOSIS — Z952 Presence of prosthetic heart valve: Secondary | ICD-10-CM

## 2020-05-19 DIAGNOSIS — R9431 Abnormal electrocardiogram [ECG] [EKG]: Secondary | ICD-10-CM

## 2020-05-19 LAB — COMPREHENSIVE METABOLIC PANEL
ALT: 39 U/L (ref 0–44)
AST: 24 U/L (ref 15–41)
Albumin: 2.3 g/dL — ABNORMAL LOW (ref 3.5–5.0)
Alkaline Phosphatase: 91 U/L (ref 38–126)
Anion gap: 10 (ref 5–15)
BUN: 21 mg/dL (ref 8–23)
CO2: 27 mmol/L (ref 22–32)
Calcium: 8.9 mg/dL (ref 8.9–10.3)
Chloride: 101 mmol/L (ref 98–111)
Creatinine, Ser: 1.31 mg/dL — ABNORMAL HIGH (ref 0.61–1.24)
GFR calc Af Amer: >60 mL/min (ref >60)
GFR calc non Af Amer: 56 mL/min — ABNORMAL LOW (ref >60)
Glucose, Bld: 109 mg/dL — ABNORMAL HIGH (ref 70–99)
Potassium: 3.8 mmol/L (ref 3.5–5.1)
Sodium: 138 mmol/L (ref 135–145)
Total Bilirubin: 0.8 mg/dL (ref 0.3–1.2)
Total Protein: 5.5 g/dL — ABNORMAL LOW (ref 6.5–8.1)

## 2020-05-19 LAB — GLUCOSE, CAPILLARY
Glucose-Capillary: 108 mg/dL — ABNORMAL HIGH (ref 70–99)
Glucose-Capillary: 131 mg/dL — ABNORMAL HIGH (ref 70–99)
Glucose-Capillary: 97 mg/dL (ref 70–99)
Glucose-Capillary: 152 mg/dL — ABNORMAL HIGH (ref 70–99)
Glucose-Capillary: 165 mg/dL — ABNORMAL HIGH (ref 70–99)

## 2020-05-19 LAB — TSH: TSH: 7.363 u[IU]/mL — ABNORMAL HIGH (ref 0.350–4.500)

## 2020-05-19 MED ORDER — ALBUMIN HUMAN 25 % IV SOLN
25.0000 g | Freq: Once | INTRAVENOUS | Status: AC
  Administered 2020-05-19: 25 g via INTRAVENOUS
  Filled 2020-05-19: qty 100

## 2020-05-19 MED ORDER — FUROSEMIDE 10 MG/ML IJ SOLN
40.0000 mg | Freq: Two times a day (BID) | INTRAMUSCULAR | Status: DC
  Administered 2020-05-19 – 2020-05-21 (×5): 40 mg via INTRAVENOUS
  Filled 2020-05-19 (×5): qty 4

## 2020-05-19 MED ORDER — POTASSIUM CHLORIDE CRYS ER 20 MEQ PO TBCR
20.0000 meq | EXTENDED_RELEASE_TABLET | Freq: Every day | ORAL | Status: DC
  Administered 2020-05-19 – 2020-05-21 (×3): 20 meq via ORAL
  Filled 2020-05-19 (×3): qty 1

## 2020-05-19 NOTE — Progress Notes (Signed)
Occupational Therapy Session Note  Patient Details  Name: Johnathan Murray MRN: [id number] Date of Birth: October 24, 1951  Today's Date: 05/19/2020 OT Individual Time: 1400-1515 OT Individual Time Calculation (min): 75 min    Short Term Goals: Week 1:  OT Short Term Goal 1 (Week 1): Pt will don pants with modA and LRAD PRN. OT Short Term Goal 2 (Week 1): Pt will complete toilet transfer to [facility] with modA. OT Short Term Goal 3 (Week 1): Pt will adhere to sternal precautions with min verbal cues during ADL task. OT Short Term Goal 4 (Week 1): Pt will complete bathing at sit > stand level with mod assist of one caregiver  Skilled Therapeutic Interventions/Progress Updates:    Pt supine in bed upon OT arrival with wife at bedside.  Pt agreeable to OT, however with c/o low back pain 4-5 at rest and sharp with activity.  Requesting clarification regarding status of sternal precautions from nursing during session.  Per pts assigned nurse, Linna Hoff PA, providing verbal discontinuation orders for sternal precautions at this time.  Pt completed supine to sit EOB with CGA.  Sit to stand at [facility] with min assist + 2 for initial ascent and CGA to maintain static standing x 3 trials for approx 1 minute duration each.  Pt needing to return to sitting each time secondary to significant low back pain.  Pt requesting larger BSC due to current one opening too small per pt.  Trialed bariatric BSC, however pt reporting still opening not large enough for successful continence.  Discussed with rehab tech and plan to trial alternate BSC at next session. Pt completed  SPT EOB<>BSC with min assist +2 for initial ascent each time and close CGA for pivoting using RW.  Pt needing short supine RB to relieve back pain and then agreeable to returning supine to sit EOB with CGA.  Pt educated on compensatory strategy using reacher to donn/doff pants utilizing gathering pant leg prior to threading.  Pt donned and doffed loose fitting shorts  using reacher with min assist.  Pt returned to supine with mod assist for BLE support.  Call bell in reach, bed alarm on.  Therapy Documentation Precautions:  Precautions Precautions: Fall Precaution Comments: Per PA verbal orders, pt no longer needs sternal precautions Required Braces or Orthoses: Spinal Brace Spinal Brace: Lumbar corset Other Brace: for comfort Restrictions Weight Bearing Restrictions: No Other Position/Activity Restrictions: sternal precautions   Therapy/Group: Individual Therapy  Ezekiel Slocumb 05/19/2020, 4:14 PM

## 2020-05-19 NOTE — Progress Notes (Signed)
Occupational Therapy Session Note  Patient Details  Name: Johnathan Murray MRN: [id number] Date of Birth: 04-15-1951  Today's Date: 05/19/2020 OT Individual Time: 9563-8756 OT Individual Time Calculation (min): 61 min    Short Term Goals: Week 1:  OT Short Term Goal 1 (Week 1): Pt will don pants with modA and LRAD PRN. OT Short Term Goal 2 (Week 1): Pt will complete toilet transfer to [facility] with modA. OT Short Term Goal 3 (Week 1): Pt will adhere to sternal precautions with min verbal cues during ADL task. OT Short Term Goal 4 (Week 1): Pt will complete bathing at sit > stand level with mod assist of one caregiver  Skilled Therapeutic Interventions/Progress Updates:    Treatment session with focus on ADL retraining, sit > stand, functional transfers, and activity tolerance.  Pt on bed pan upon arrival.  Wife present and providing cues for rolling and providing assist with hygiene and clothing management.  Therapist educated on LB dressing goal with plan to engage in dressing during future therapy sessions.  Pt attempted to obtain circle sitting in bed to assess whether he could do socks in bed, still lacking enough knee flexion.  Therapist donned knee high TEDS and hospital socks.  Completed sidelying to sitting at EOB with min assist.  Engaged in sit > stand from elevated EOB with focus on increased anterior weight shift and minimizing push up through BUE due to sternal precautions.  Pt required min-mod assist for sit > stand from elevated surface.  Completed forward and backward stepping with RW with mod assist and +2 for safety.  Pt completed stand pivot transfer bed > w/c with mod assist and increased cues for sequencing of stepping pattern as he fatigued and reports increased pain in lower back.  [employer] with focus on activity tolerance and increased knee flexion as needed for LB dressing.  Pt tolerated ~8 minutes with 2 rest breaks.  HR 92-114 during activity.  Returned to room and  transferred back to bed Mod assist sit > stand from w/c height and mod assist during stand pivot transfer.  Therapist assisted with lifting BLE in to bed.  Pt left semi-reclined with all needs in reach and wife present.  Therapy Documentation Precautions:  Precautions Precautions: Sternal, Fall Precaution Comments: freq cues to adhere to sternal prec Required Braces or Orthoses: Spinal Brace Spinal Brace: Lumbar corset Other Brace: for comfort, pt reports "it doesn't really help me" so we didn't use back brace Restrictions Weight Bearing Restrictions: No Other Position/Activity Restrictions: sternal precautions  Pain: Pain Assessment Pain Scale: 0-10 Pain Score: 4 Pain Type: Acute pain Pain Location: Back Pain Orientation: Lower Pain Descriptors / Indicators: guarding Pain Frequency: Constant Pain Onset: On-going Patients Stated Pain Goal: 2 Pain Intervention(s):Premedicated before session   Therapy/Group: Individual Therapy  Simonne Come 05/19/2020, 9:56 AM

## 2020-05-19 NOTE — Progress Notes (Signed)
Physical Therapy Session Note  Patient Details  Name: Johnathan Murray MRN: [id number] Date of Birth: 1951-09-22  Today's Date: 05/19/2020 PT Individual Time: 1000-1100 PT Individual Time Calculation (min): 60 min   Short Term Goals: Week 1:  PT Short Term Goal 1 (Week 1): pt will perform supine to sit w/cga and adhere to precautions PT Short Term Goal 2 (Week 1): Pt will perform STS w/min assist PT Short Term Goal 3 (Week 1): pt will perform bed to chair w/min assist PT Short Term Goal 4 (Week 1): Pt will ambulate 54ft w/LRAD and mod assist of 1  Skilled Therapeutic Interventions/Progress Updates:   Received pt supine in bed, pt agreeable to therapy, and reported pain 4/10 in low back (premedicated). Repositioning, rest breaks, and distraction done to reduce pain. Session with emphasis on functional mobility/transfers, generalized strengthening, dynamic standing balance/coordination, and improved activity tolerance. LSO donned throughout session. Pt transferred supine<>sitting EOB with min A and cues for logroll technique and to maintain sternal precautions. Donned LSO total A while sitting EOB. Pt transferred stand<>pivot bed<>WC with RW from extremely elevated bed with min A and transported to dayroom in [facility] total A for time management purposes. Pt transferred stand<>pivot WC<>mat with RW min/mod A with cues for hand placement on WC and RW and turning technique. Pt transferred sit<>stand with RW min A from elevated mat and worked on dynamic standing balance and trunk control playing cornhole x 2 trials with min/mod A for balance. Pt required cues for upright posture and bilateral knee extension. Pt required multiple rest breaks throughout session due to increased fatigue and low back pain. Pt performed the following exercises seated on mat with supervision and verbal cues for technique: -LAQ x8 bilaterally (pt with decreased ROM bilaterally) -hip adduction ball squeezes 2x10  -hip flexion x 8  bilaterally Pt transferred stand<>pivot mat<>WC with RW min/mod A with same cues mentioned above and transported back to room in [facility] total A. Pt transferred WC<>bed stand<>pivot with RW min A and sit<>supine with mod A for LE management. Concluded session with pt supine in bed, needs within reach, and bed alarm on.   Therapy Documentation Precautions:  Precautions Precautions: Sternal, Fall Precaution Comments: freq cues to adhere to sternal prec Required Braces or Orthoses: Spinal Brace Spinal Brace: Lumbar corset Other Brace: for comfort, pt reports "it doesn't really help me" so we didn't use back brace Restrictions Weight Bearing Restrictions: No Other Position/Activity Restrictions: sternal precautions   Therapy/Group: Individual Therapy Alfonse Alpers PT, DPT   05/19/2020, 7:29 AM

## 2020-05-19 NOTE — Plan of Care (Signed)
  Problem: RH BOWEL ELIMINATION Goal: RH STG MANAGE BOWEL WITH ASSISTANCE Description: STG Manage Bowel with mod I Assistance. Outcome: Progressing Goal: RH STG MANAGE BOWEL W/MEDICATION W/ASSISTANCE Description: STG Manage Bowel with Medication with mod I Assistance. Outcome: Progressing   Problem: RH SKIN INTEGRITY Goal: RH STG MAINTAIN SKIN INTEGRITY WITH ASSISTANCE Description: STG Maintain Skin Integrity With min Assistance. Outcome: Progressing Goal: RH STG ABLE TO PERFORM INCISION/WOUND CARE W/ASSISTANCE Description: STG Able To Perform Incision/Wound Care With min Assistance. Outcome: Progressing   Problem: RH SAFETY Goal: RH STG ADHERE TO SAFETY PRECAUTIONS W/ASSISTANCE/DEVICE Description: STG Adhere to Safety Precautions With cues/reminders Assistance/Device. Outcome: Progressing

## 2020-05-19 NOTE — Progress Notes (Signed)
[facility] PHYSICAL MEDICINE & REHABILITATION PROGRESS NOTE  Subjective/Complaints: Patient seen sitting up in bed this morning.  Family at bedside.  Patient states he slept well overnight.  He states he is tired this morning, similar to yesterday.  He was seen by cardiology yesterday, notes reviewed as well as discussed with PA-plan for diuresis.  Discussed with patient low threshold for return to acute if necessary given complicated medical course patient reluctant, however family understanding.  ROS: Denies shortness of breath, CP, N/V/D  Objective: Vital Signs: Blood pressure 107/64, pulse 71, temperature 97.8 F (36.6 C), temperature source Oral, resp. rate 16, height 5\' 9"  (1.753 m), weight 92.6 kg, SpO2 100 %. No results found. Recent Labs    05/18/20 1044  WBC 8.4  HGB 7.9*  HCT 26.7*  PLT 349   Recent Labs    05/18/20 1044 05/19/20 0421  NA 136 138  K 4.0 3.8  CL 101 101  CO2 26 27  GLUCOSE 130* 109*  BUN 18 21  CREATININE 1.25* 1.31*  CALCIUM 8.6* 8.9    Physical Exam: BP 107/64 (BP Location: Left Arm)   Pulse 71   Temp 97.8 F (36.6 C) (Oral)   Resp 16   Ht 5\' 9"  (1.753 m)   Wt 92.6 kg   SpO2 100%   BMI 30.15 kg/m  Constitutional: No distress . Vital signs reviewed. HENT: Normocephalic.  Atraumatic. Eyes: EOMI. No discharge. Cardiovascular: No JVD.  Irregularly irregular Respiratory: Normal effort.  No stridor.  Bilaterally clear to auscultation GI: Non-distended.  BS + Skin: Right knee with staples C/D/I, unchanged Left knee with staples C/D/I, erythema along proximal >distal staple lines, unchanged Psych: Normal mood.  Normal behavior. Musc: Left > right knee with edema and tenderness, unchanged  Bilateral lower extremity edema, unchanged Neuro: Alert Motor: B/l UE: 5/5 proximal to distal, unchanged Right lower extremity: Hip flexion, knee extension 3/5, ankle dorsiflexion 4+/5 Left lower extremity: Hip flexion, knee extension 2/5, ankle  dorsiflexion 4+/5  Assessment/Plan: 1. Functional deficits secondary to debility due to septic knees, disciitis which require 3+ hours per day of interdisciplinary therapy in a comprehensive inpatient rehab setting.  Physiatrist is providing close team supervision and 24 hour management of active medical problems listed below.  Physiatrist and rehab team continue to assess barriers to discharge/monitor patient progress toward functional and medical goals  Care Tool:  Bathing  Bathing activity did not occur: Refused Body parts bathed by patient: Right arm, Left arm, Chest, Abdomen, Right upper leg, Left upper leg, Face   Body parts bathed by helper: Right lower leg, Left lower leg     Bathing assist Assist Level: Minimal Assistance - Patient > 75%     Upper Body Dressing/Undressing Upper body dressing   What is the patient wearing?: Pull over shirt    Upper body assist Assist Level: Minimal Assistance - Patient > 75%    Lower Body Dressing/Undressing Lower body dressing      What is the patient wearing?: Pants     Lower body assist Assist for lower body dressing: Minimal Assistance - Patient > 75%     Toileting Toileting Toileting Activity did not occur (Clothing management and hygiene only): N/A (no void or bm)  Toileting assist Assist for toileting: Supervision/Verbal cueing     Transfers Chair/bed transfer  Transfers assist     Chair/bed transfer assist level: Minimal Assistance - Patient > 75%     Locomotion Ambulation   Ambulation assist  Assist level: 2 helpers Assistive device: Walker-platform Max distance: 30   Walk 10 feet activity   Assist     Assist level: 2 helpers Assistive device: Walker-platform   Walk 50 feet activity   Assist Walk 50 feet with 2 turns activity did not occur: Safety/medical concerns         Walk 150 feet activity   Assist Walk 150 feet activity did not occur: Safety/medical concerns          Walk 10 feet on uneven surface  activity   Assist Walk 10 feet on uneven surfaces activity did not occur: Safety/medical concerns         Wheelchair     Assist Will patient use wheelchair at discharge?: No             Wheelchair 50 feet with 2 turns activity    Assist            Wheelchair 150 feet activity     Assist            Medical Problem List and Plan: 1.  Decreased functional mobility secondary to sepsis/Enterococcus faecalis bacteremia/discitis/osteomyelitis of L4-5 status post aortic root replacement 04/17/2020 status post cardioversion for atrial fibrillation.  Sternal precautions/lumbar corset for comfort x 4 more weeks  Continue CIR 2.  Antithrombotics: -DVT/anticoagulation: Resumed Eliquis 05/15/2020             -antiplatelet therapy: N/A 3. Pain Management: OxyContin sustained release 10 mg every 12 hours, Lyrica 100 mg 3 times daily Flexeril as as well as oxycodone needed  Relatively controlled on 6/29 4. Mood: N/A meds- follow for ego support             -antipsychotic agents: N/A 5. Neuropsych: This patient is capable of making decisions on his own behalf. 6. Skin/Wound Care: Routine skin checks 7. Fluids/Electrolytes/Nutrition: Routine in and outs. 8.  ID/Enterococcus bacteremia.  Continue IV ampicillin 2 g every 4 hours as well as Rocephin 2 g every 12 hours through 6/30.  Follow-up infectious disease 9.  Acute on chronic anemia.  Continue iron supplement.    Hb 7.9 on 6/28, daily labs ordered  Cont to monitor 10.  Bilateral septic knees.  Status post total synovectomy irrigation debridement bilateral knees 05/02/2020 per Dr. Sharol Given.  Weightbearing as tolerated.   Continue ice to bilateral knees  11.  AKI.     Creatinine 1.31 on 6/29 12.  PAF.  Follow-up cardiology services.  Amiodarone as directed.  Lopressor 12.5 mg twice daily.    Monitor with increased exertion  Repeat ECG reviewed, showing a flutter with prolonged QTC 13.   Diabetes mellitus with hyperglycemia.  Latest hemoglobin A1c 6.8.  Lantus insulin 10 units twice daily.   CBG (last 3)  Recent Labs    05/18/20 1650 05/18/20 2109 05/19/20 0612  GLUCAP 82 138* 108*    Relatively controlled on 6/29  Monitor with increased mobility 14.  Recent C3-4 ACDF 03/02/2020 per [name] for cervical myelopathy.  Cervical collar discontinued 15.  Hypothyroidism.  Synthroid 16. Drug-induced constipation.  MiraLAX daily, Senokot nightly  Adjust bowel meds as necessary 17.  Severe hypoalbuminemia  Supplement initiated on 6/25 18.  Transaminitis: Resolved  LFTs within normal limits on 6/29 19.  Peripheral edema  See #12, #19  IV Lasix and IV albumin per cardiology  Echo reviewed from 6/9-showing normal EF  BNP elevated 20.  Prolonged QTC  Monitor in conjunction with other medications  Appreciate cards recs 21.  Hypothyroidism  TSH elevated on 6/29  Greater than 35 minutes of total care spent in medical decision-making as well as reviewing notes and recommendations and modifying/adjusting concomitant complex medical issues.  LOS: 5 days A FACE TO FACE EVALUATION WAS PERFORMED  Hser Belanger Lorie Phenix 05/19/2020, 9:06 AM

## 2020-05-19 NOTE — Plan of Care (Signed)
  Problem: Consults Goal: RH GENERAL PATIENT EDUCATION Description: See Patient Education module for education specifics. Outcome: Progressing Goal: Skin Care Protocol Initiated - if Braden Score 18 or less Description: If consults are not indicated, leave blank or document N/A Outcome: Progressing   Problem: RH BOWEL ELIMINATION Goal: RH STG MANAGE BOWEL WITH ASSISTANCE Description: STG Manage Bowel with mod I Assistance. Outcome: Progressing Goal: RH STG MANAGE BOWEL W/MEDICATION W/ASSISTANCE Description: STG Manage Bowel with Medication with mod I Assistance. Outcome: Progressing   Problem: RH SKIN INTEGRITY Goal: RH STG MAINTAIN SKIN INTEGRITY WITH ASSISTANCE Description: STG Maintain Skin Integrity With min Assistance. Outcome: Progressing Goal: RH STG ABLE TO PERFORM INCISION/WOUND CARE W/ASSISTANCE Description: STG Able To Perform Incision/Wound Care With min Assistance. Outcome: Progressing   Problem: RH SAFETY Goal: RH STG ADHERE TO SAFETY PRECAUTIONS W/ASSISTANCE/DEVICE Description: STG Adhere to Safety Precautions With cues/reminders Assistance/Device. Outcome: Progressing   Problem: RH PAIN MANAGEMENT Goal: RH STG PAIN MANAGED AT OR BELOW PT'S PAIN GOAL Description: Less than 4  Outcome: Progressing   Problem: Consults Goal: RH GENERAL PATIENT EDUCATION Description: See Patient Education module for education specifics. Outcome: Progressing

## 2020-05-19 NOTE — Progress Notes (Signed)
Progress Note  Patient Name: Johnathan Murray Date of Encounter: 05/19/2020  [facility] Cardiologist: Jenean Lindau, MD   Subjective   No chest pain or SOB, feels better less edema  Inpatient Medications    Scheduled Meds: . amiodarone  200 mg Oral BID   Followed by  . [START ON 05/24/2020] amiodarone  200 mg Oral Daily  . [START ON 05/21/2020] amoxicillin  500 mg Oral Q12H  . apixaban  5 mg Oral BID  . Chlorhexidine Gluconate Cloth  6 each Topical Daily  . feeding supplement (ENSURE ENLIVE)  237 mL Oral TID BM  . feeding supplement (PRO-STAT SUGAR FREE 64)  30 mL Oral BID  . ferrous [id number] C-folic acid  1 capsule Oral Q breakfast  . ferrous sulfate  325 mg Oral BID WC  . furosemide  40 mg Intravenous Q12H  . insulin aspart  0-20 Units Subcutaneous TID WC  . insulin glargine  10 Units Subcutaneous BID  . levothyroxine  25 mcg Oral QAC breakfast  . metoprolol tartrate  12.5 mg Oral BID  . oxyCODONE  10 mg Oral Q12H  . polyethylene glycol  17 g Oral Daily  . pregabalin  100 mg Oral TID  . senna  1 tablet Oral QHS   Continuous Infusions: . ampicillin (OMNIPEN) IV 2 g (05/19/20 0811)  . cefTRIAXone (ROCEPHIN) IVPB 2 gram/100 mL NS (Mini-Bag Plus) 2 g (05/19/20 0501)   PRN Meds: acetaminophen **OR** acetaminophen, albuterol, cyclobenzaprine, oxyCODONE, sodium chloride flush, sorbitol   Vital Signs    Vitals:   05/18/20 1420 05/18/20 1948 05/19/20 0401 05/19/20 0617  BP: 130/78 113/69 107/64   Pulse: 86 86 71   Resp: 20 18 16    Temp: 98.2 F (36.8 C) 98.5 F (36.9 C) 97.8 F (36.6 C)   TempSrc: Oral Oral Oral   SpO2: 100% 99% 100%   Weight:    92.6 kg  Height:        Intake/Output Summary (Last 24 hours) at 05/19/2020 0908 Last data filed at 05/19/2020 0825 Gross per 24 hour  Intake 480 ml  Output 5401 ml  Net -4921 ml   Last 3 Weights 05/19/2020 05/14/2020 05/14/2020  Weight (lbs) 204 lb 2.3 oz 200 lb 9.9 oz 198 lb 6.6 oz  Weight (kg)  92.6 kg 91 kg 90 kg      Telemetry    none - Personally Reviewed  ECG    No new - Personally Reviewed  Physical Exam  Exam per Dr. Debara Pickett   Labs    High Sensitivity Troponin:  No results for input(s): TROPONINIHS in the last 720 hours.    Chemistry Recent Labs  Lab 05/15/20 0629 05/18/20 1044 05/19/20 0421  NA 137 136 138  K 4.1 4.0 3.8  CL 101 101 101  CO2 27 26 27   GLUCOSE 115* 130* 109*  BUN 18 18 21   CREATININE 1.22 1.25* 1.31*  CALCIUM 8.5* 8.6* 8.9  PROT 5.3* 5.2* 5.5*  ALBUMIN 2.0* 2.0* 2.3*  AST 34 24 24  ALT 61* 40 39  ALKPHOS 104 94 91  BILITOT 0.9 1.0 0.8  GFRNONAA >60 59* 56*  GFRAA >60 >60 >60  ANIONGAP 9 9 10      Hematology Recent Labs  Lab 05/14/20 0446 05/15/20 0629 05/18/20 1044  WBC 8.6 9.3 8.4  RBC 3.00* 3.02* 2.90*  HGB 8.4* 8.4* 7.9*  HCT 27.8* 27.6* 26.7*  MCV 92.7 91.4 92.1  MCH 28.0 27.8 27.2  MCHC 30.2  30.4 29.6*  RDW 17.4* 17.3* 17.2*  PLT 417* 417* 349    BNP Recent Labs  Lab 05/13/20 0527 05/14/20 0446 05/18/20 1044  BNP 146.7* 135.1* 134.7*     DDimer No results for input(s): DDIMER in the last 168 hours.   Radiology    No results found.  Cardiac Studies    LAST Echo  TEE and DCCV 04/29/20  IMPRESSIONS    1. Left ventricular ejection fraction, by estimation, is 55 to 60%. The  left ventricle has normal function. The left ventricle has no regional  wall motion abnormalities.  2. Right ventricular systolic function is mildly reduced. The right  ventricular size is normal.  3. No left atrial/left atrial appendage thrombus was detected. The LAA  emptying velocity was 50 cm/s.  4. Multiple regurgitant jets each with mild regurgitation. The mitral  valve is normal in structure. Mild to moderate mitral valve regurgitation.  No evidence of mitral stenosis.  5. Tricuspid valve regurgitation is mild to moderate.  6. The aortic valve has been repaired/replaced. Aortic valve  regurgitation is trivial.  No aortic stenosis is present. There is a 23 mm  Homograft valve/aortic root valve present in the aortic position.  Procedure Date: 04/16/2020. Echo findings are  consistent with normal structure and function of the aortic valve  prosthesis.  7. The inferior vena cava is normal in size with greater than 50%  respiratory variability, suggesting right atrial pressure of 3 mmHg.   Conclusion(s)/Recommendation(s): Normal biventricular function without  evidence of hemodynamically significant valvular heart disease.   FINDINGS  Left Ventricle: Left ventricular ejection fraction, by estimation, is 55  to 60%. The left ventricle has normal function. The left ventricle has no  regional wall motion abnormalities. The left ventricular internal cavity  size was normal in size. There is  no left ventricular hypertrophy.   Right Ventricle: The right ventricular size is normal. No increase in  right ventricular wall thickness. Right ventricular systolic function is  mildly reduced.   Left Atrium: Left atrial size was normal in size. No left atrial/left  atrial appendage thrombus was detected. The LAA emptying velocity was 50  cm/s.   Right Atrium: Right atrial size was normal in size.   Pericardium: There is no evidence of pericardial effusion.   Mitral Valve: Multiple regurgitant jets each with mild regurgitation. The  mitral valve is normal in structure. Normal mobility of the mitral valve  leaflets. Mild to moderate mitral valve regurgitation. No evidence of  mitral valve stenosis.   Tricuspid Valve: The tricuspid valve is normal in structure. Tricuspid  valve regurgitation is mild to moderate. No evidence of tricuspid  stenosis.   Aortic Valve: No perivalvular regurgitation or abscess. The aortic valve  has been repaired/replaced. Aortic valve regurgitation is trivial. No  aortic stenosis is present. There is a 23 mm Homograft valve/aortic root  valve present in the aortic  position.  Procedure Date: 04/16/2020. Echo findings are consistent with normal  structure and function of the aortic valve prosthesis.   Pulmonic Valve: The pulmonic valve was normal in structure. Pulmonic valve  regurgitation is trivial. No evidence of pulmonic stenosis.   Aorta: The aortic root is normal in size and structure. There is minimal  (Grade I) plaque involving the descending aorta.   Venous: The inferior vena cava is normal in size with greater than 50%  respiratory variability, suggesting right atrial pressure of 3 mmHg.   IAS/Shunts: No atrial level shunt detected by color  flow Doppler. There is  no evidence of a patent foramen ovale. There is no evidence of an atrial  septal defect.     MR Peak grad:  75.3 mmHg  TRICUSPID VALVE  MR Mean grad:  42.0 mmHg  TR Peak grad:  18.1 mmHg  MR Vmax:     434.00 cm/s TR Vmax:    213.00 cm/s  MR Vmean:    296.0 cm/s  MR PISA:     1.01 cm  MR PISA Eff ROA: 9 mm  MR PISA Radius: 0.40 cm    Patient Profile     69 y.o. 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 now s/p debridement of aortic root abscess and aortic root replacement on 04/16/2020, paroxysmal atrial fibrillation on Eliquis,recurrent bilateral knee effusions and septic arthritis with arthrocentesis (6/10) ,hypertension, dyslipidemia, diabetes on insulin, hypothyroidism, lumbar stenosis with neurogenic claudication, and cervical decompression in 3/2021and a fib in rehab and back to main hospital at [facility] for amiodarone and thought to repeat DCCV in future if a fib persists and infection improves (this was written 02/05/20) now with volume overload and RVR in his a fib.    Assessment & Plan    1. Persistent atrial fib now with higher rate.  Labs pending for underlying cause  of tachycardia such as anemia, infection - volume overload, plan has been to continue in a fib for now and plan DCCV once he has recovered from acute illness.   He is on amiodarone 200 mg BID and I do not see his metoprolol 25 mg BID that he was discharged on.  Will resume at 12.5 BID though may need higher dose.  HGb down slightly, WBC WNL   On amiodarone  HR 71  Fast with exercise.  Will reorder albumin and continue lasix but will change to BID 2. Anticoagulation on eliquis.  5 mg BID   3. Anemia with hgb 8.4 on last check 4. Edema with low albumin, and on 2 po supplements --Placed on IV lasix and given albumin yesterday - negative [phone] yesterday  5. Recent AKI labs pending.            For questions or updates, please contact [place] Please consult [email or web address] for contact info under        Signed, Cecilie Kicks, NP  05/19/2020, 9:08 AM

## 2020-05-20 ENCOUNTER — Inpatient Hospital Stay (HOSPITAL_COMMUNITY): Payer: BC Managed Care – PPO | Admitting: Occupational Therapy

## 2020-05-20 ENCOUNTER — Inpatient Hospital Stay (HOSPITAL_COMMUNITY): Payer: BC Managed Care – PPO

## 2020-05-20 DIAGNOSIS — R601 Generalized edema: Secondary | ICD-10-CM

## 2020-05-20 LAB — CBC WITH DIFFERENTIAL/PLATELET
Abs Immature Granulocytes: 0.04 10*3/uL (ref 0.00–0.07)
Basophils Absolute: 0.1 10*3/uL (ref 0.0–0.1)
Basophils Relative: 1 %
Eosinophils Absolute: 0.3 10*3/uL (ref 0.0–0.5)
Eosinophils Relative: 5 %
HCT: 27 % — ABNORMAL LOW (ref 39.0–52.0)
Hemoglobin: 8.1 g/dL — ABNORMAL LOW (ref 13.0–17.0)
Immature Granulocytes: 1 %
Lymphocytes Relative: 12 %
Lymphs Abs: 0.8 10*3/uL (ref 0.7–4.0)
MCH: 27.6 pg (ref 26.0–34.0)
MCHC: 30 g/dL (ref 30.0–36.0)
MCV: 92.2 fL (ref 80.0–100.0)
Monocytes Absolute: 0.5 10*3/uL (ref 0.1–1.0)
Monocytes Relative: 7 %
Neutro Abs: 5.2 10*3/uL (ref 1.7–7.7)
Neutrophils Relative %: 74 %
Platelets: 346 10*3/uL (ref 150–400)
RBC: 2.93 MIL/uL — ABNORMAL LOW (ref 4.22–5.81)
RDW: 17.2 % — ABNORMAL HIGH (ref 11.5–15.5)
WBC: 6.9 10*3/uL (ref 4.0–10.5)
nRBC: 0 % (ref 0.0–0.2)

## 2020-05-20 LAB — COMPREHENSIVE METABOLIC PANEL
ALT: 37 U/L (ref 0–44)
AST: 26 U/L (ref 15–41)
Albumin: 2.4 g/dL — ABNORMAL LOW (ref 3.5–5.0)
Alkaline Phosphatase: 89 U/L (ref 38–126)
Anion gap: 10 (ref 5–15)
BUN: 22 mg/dL (ref 8–23)
CO2: 28 mmol/L (ref 22–32)
Calcium: 8.7 mg/dL — ABNORMAL LOW (ref 8.9–10.3)
Chloride: 100 mmol/L (ref 98–111)
Creatinine, Ser: 1.36 mg/dL — ABNORMAL HIGH (ref 0.61–1.24)
GFR calc Af Amer: >60 mL/min (ref >60)
GFR calc non Af Amer: 53 mL/min — ABNORMAL LOW (ref >60)
Glucose, Bld: 153 mg/dL — ABNORMAL HIGH (ref 70–99)
Potassium: 3.7 mmol/L (ref 3.5–5.1)
Sodium: 138 mmol/L (ref 135–145)
Total Bilirubin: 0.8 mg/dL (ref 0.3–1.2)
Total Protein: 5.3 g/dL — ABNORMAL LOW (ref 6.5–8.1)

## 2020-05-20 LAB — GLUCOSE, CAPILLARY
Glucose-Capillary: 112 mg/dL — ABNORMAL HIGH (ref 70–99)
Glucose-Capillary: 163 mg/dL — ABNORMAL HIGH (ref 70–99)
Glucose-Capillary: 139 mg/dL — ABNORMAL HIGH (ref 70–99)

## 2020-05-20 MED ORDER — LIDOCAINE 5 % EX PTCH
1.0000 | MEDICATED_PATCH | CUTANEOUS | Status: DC
  Administered 2020-05-20 – 2020-05-28 (×9): 1 via TRANSDERMAL
  Filled 2020-05-20 (×10): qty 1

## 2020-05-20 MED ORDER — ALBUMIN HUMAN 25 % IV SOLN
25.0000 g | Freq: Once | INTRAVENOUS | Status: AC
  Administered 2020-05-20: 25 g via INTRAVENOUS
  Filled 2020-05-20: qty 100

## 2020-05-20 NOTE — Progress Notes (Signed)
Patient ID: Johnathan Murray, male   DOB: 02/05/1951, 69 y.o.   MRN: [id number]  Team Conference Report to Patient/Family  Team Conference discussion was reviewed with the patient and caregiver, including goals, any changes in plan of care and target discharge date.  Patient and caregiver express understanding and are in agreement.  The patient has a target discharge date of 06/04/20.  [name] 05/20/2020, 2:09 PM

## 2020-05-20 NOTE — Progress Notes (Signed)
Patient has rash forming in between his legs and was wondering if doctor could prescribe a cream for the rash. Nurse said she would write note so doctor can address in the morning. [place]

## 2020-05-20 NOTE — Progress Notes (Signed)
Physical Therapy Session Note  Patient Details  Name: Johnathan Murray MRN: [id number] Date of Birth: 09-02-1951  Today's Date: 05/20/2020 PT Individual Time: 1015-1126 PT Individual Time Calculation (min): 71 min   Short Term Goals: Week 1:  PT Short Term Goal 1 (Week 1): pt will perform supine to sit w/cga and adhere to precautions PT Short Term Goal 2 (Week 1): Pt will perform STS w/min assist PT Short Term Goal 3 (Week 1): pt will perform bed to chair w/min assist PT Short Term Goal 4 (Week 1): Pt will ambulate 62ft w/LRAD and mod assist of 1  Skilled Therapeutic Interventions/Progress Updates:   Received pt supine in bed using urinal with assist from wife, pt agreeable to therapy, and reported pain 5/10 in low back (premedicated). Repositioning, rest breaks, and distraction done to reduce pain. Session with emphasis on functional mobility/transfers, generalized strengthening, dynamic standing balance/coordination, ambulation, and improved activity tolerance. Pt transferred supine<>sitting EOB with supervision/CGA with HOB elevated and use of bedrails with cues to maintain sternal precautions. However, pt informed therapist that his sternal precautions were lifted as of yesterday. Donned LSO sitting EOB with max A. Pt transferred stand<>pivot bed<>WC with RW and min A from elevated bed and transported to therapy gym in [facility] total A for time management and energy conservation. Pt transferred sit<>stand with RW min A and ambulated 70ft with RW mod A +2 for close WC follow. Pt with flexed trunk, wide BOS, and decreased stride length and required cues for upright posture. Pt stopped multiple times while ambulating due to pain spasms in low back; therapist encouraged standing rest breaks and pursed lip breathing. Pt required multiple rest/water breaks throughout session due to increased fatigue and back pain. Pt requested to exercise on Kinetron and was transported to dayroom in [facility] total A. Pt  performed bilateral LE strengthening on Kinetron for 2 minutes x 1 and 1 minute x 3 trials at 40 cm/sec for a total of 5 minutes with supervision and back support with emphasis on quadriceps strengthening. Cardiology present during session to discuss diuresis and pt reported 6/10 fatigue after activities. Pt transferred sit<>stand at table in dayroom with mod A with 1 UE on WC armrest and other UE on table and worked on standing tolerance stacking cones x 2 trials with min A for balance. Pt able to remain standing for 2 minutes and 20 seconds x 1 trial and 1 minute and 2 seconds x trial 2 prior to requesting to sit. Pt transported back to room in [facility] total A and transferred stand<>pivot WC<>bed with RW mod A, doffed LSO total A, and transferred sit<>supine with mod A for LE management. Concluded session with pt supine in bed, needs within reach, and bed alarm on. Wife present at bedside.   Therapy Documentation Precautions:  Precautions Precautions: Fall Precaution Comments: freq cues to adhere to sternal prec Required Braces or Orthoses: Spinal Brace Spinal Brace: Lumbar corset Other Brace: for comfort Restrictions Weight Bearing Restrictions: No Other Position/Activity Restrictions: sternal precautions  Therapy/Group: Individual Therapy Alfonse Alpers PT, DPT   05/20/2020, 7:29 AM

## 2020-05-20 NOTE — Progress Notes (Signed)
Progress Note  Patient Name: Johnathan Murray Date of Encounter: 05/20/2020  Primary Cardiologist: Jenean Lindau, MD   Subjective   Feeling well today. States he worked hard with rehab yesterday so a little sore today. Diuresed well overnight NO SOB, CP  Inpatient Medications    Scheduled Meds: . amiodarone  200 mg Oral BID   Followed by  . [START ON 05/24/2020] amiodarone  200 mg Oral Daily  . [START ON 05/21/2020] amoxicillin  500 mg Oral Q12H  . apixaban  5 mg Oral BID  . Chlorhexidine Gluconate Cloth  6 each Topical Daily  . feeding supplement (ENSURE ENLIVE)  237 mL Oral TID BM  . feeding supplement (PRO-STAT SUGAR FREE 64)  30 mL Oral BID  . ferrous [id number] C-folic acid  1 capsule Oral Q breakfast  . ferrous sulfate  325 mg Oral BID WC  . furosemide  40 mg Intravenous BID  . insulin aspart  0-20 Units Subcutaneous TID WC  . insulin glargine  10 Units Subcutaneous BID  . levothyroxine  25 mcg Oral QAC breakfast  . metoprolol tartrate  12.5 mg Oral BID  . oxyCODONE  10 mg Oral Q12H  . polyethylene glycol  17 g Oral Daily  . potassium chloride  20 mEq Oral Daily  . pregabalin  100 mg Oral TID  . senna  1 tablet Oral QHS   Continuous Infusions: . ampicillin (OMNIPEN) IV 2 g (05/20/20 0756)  . cefTRIAXone (ROCEPHIN) IVPB 2 gram/100 mL NS (Mini-Bag Plus) 2 g (05/20/20 0551)   PRN Meds: acetaminophen **OR** acetaminophen, albuterol, cyclobenzaprine, oxyCODONE, sodium chloride flush, sorbitol   Vital Signs    Vitals:   05/19/20 0617 05/19/20 1549 05/19/20 1942 05/20/20 0459  BP:  99/66 102/69 106/68  Pulse:  87 93 85  Resp:  18 18 16   Temp:  99.3 F (37.4 C) 98.8 F (37.1 C) 98.4 F (36.9 C)  TempSrc:  Oral Oral Oral  SpO2:  100% 100% 99%  Weight: 92.6 kg   90.1 kg  Height:        Intake/Output Summary (Last 24 hours) at 05/20/2020 0942 Last data filed at 05/20/2020 0756 Gross per 24 hour  Intake 1200 ml  Output 4025 ml  Net -2825 ml     Filed Weights   05/14/20 1551 05/19/20 0617 05/20/20 0459  Weight: 91 kg 92.6 kg 90.1 kg    Physical Exam   General: Well developed, well nourished, NAD Neck: Negative for carotid bruits. No JVD Lungs:Clear to ausculation bilaterally. No wheezes, rales, or rhonchi. Breathing is unlabored. Cardiovascular: Irregularly irregular with S1 S2.  Abdomen: Soft, non-tender, non-distended. No obvious abdominal masses. Extremities: 2+ BLE edema. Radial pulses 2+ bilaterally Neuro: Alert and oriented. No focal deficits. No facial asymmetry. MAE spontaneously. Psych: Responds to questions appropriately with normal affect.    Labs    Chemistry Recent Labs  Lab 05/18/20 1044 05/19/20 0421 05/20/20 0450  NA 136 138 138  K 4.0 3.8 3.7  CL 101 101 100  CO2 26 27 28   GLUCOSE 130* 109* 153*  BUN 18 21 22   CREATININE 1.25* 1.31* 1.36*  CALCIUM 8.6* 8.9 8.7*  PROT 5.2* 5.5* 5.3*  ALBUMIN 2.0* 2.3* 2.4*  AST 24 24 26   ALT 40 39 37  ALKPHOS 94 91 89  BILITOT 1.0 0.8 0.8  GFRNONAA 59* 56* 53*  GFRAA >60 >60 >60  ANIONGAP 9 10 10      Hematology Recent Labs  Lab 05/15/20 0629 05/18/20 1044 05/20/20 0450  WBC 9.3 8.4 6.9  RBC 3.02* 2.90* 2.93*  HGB 8.4* 7.9* 8.1*  HCT 27.6* 26.7* 27.0*  MCV 91.4 92.1 92.2  MCH 27.8 27.2 27.6  MCHC 30.4 29.6* 30.0  RDW 17.3* 17.2* 17.2*  PLT 417* 349 346    Cardiac EnzymesNo results for input(s): TROPONINI in the last 168 hours. No results for input(s): TROPIPOC in the last 168 hours.   BNP Recent Labs  Lab 05/14/20 0446 05/18/20 1044  BNP 135.1* 134.7*     DDimer No results for input(s): DDIMER in the last 168 hours.   Radiology    No results found.  Telemetry    No currently on telelmetry   ECG    No new tracings os of 05/20/20- Personally Reviewed  Cardiac Studies   TEE and DCCV 04/29/20  IMPRESSIONS    1. Left ventricular ejection fraction, by estimation, is 55 to 60%. The  left ventricle has normal function. The  left ventricle has no regional  wall motion abnormalities.  2. Right ventricular systolic function is mildly reduced. The right  ventricular size is normal.  3. No left atrial/left atrial appendage thrombus was detected. The LAA  emptying velocity was 50 cm/s.  4. Multiple regurgitant jets each with mild regurgitation. The mitral  valve is normal in structure. Mild to moderate mitral valve regurgitation.  No evidence of mitral stenosis.  5. Tricuspid valve regurgitation is mild to moderate.  6. The aortic valve has been repaired/replaced. Aortic valve  regurgitation is trivial. No aortic stenosis is present. There is a 23 mm  Homograft valve/aortic root valve present in the aortic position.  Procedure Date: 04/16/2020. Echo findings are  consistent with normal structure and function of the aortic valve  prosthesis.  7. The inferior vena cava is normal in size with greater than 50%  respiratory variability, suggesting right atrial pressure of 3 mmHg.   Conclusion(s)/Recommendation(s): Normal biventricular function without  evidence of hemodynamically significant valvular heart disease.   FINDINGS  Left Ventricle: Left ventricular ejection fraction, by estimation, is 55  to 60%. The left ventricle has normal function. The left ventricle has no  regional wall motion abnormalities. The left ventricular internal cavity  size was normal in size. There is  no left ventricular hypertrophy.   Right Ventricle: The right ventricular size is normal. No increase in  right ventricular wall thickness. Right ventricular systolic function is  mildly reduced.   Left Atrium: Left atrial size was normal in size. No left atrial/left  atrial appendage thrombus was detected. The LAA emptying velocity was 50  cm/s.   Right Atrium: Right atrial size was normal in size.   Pericardium: There is no evidence of pericardial effusion.   Mitral Valve: Multiple regurgitant jets each with mild  regurgitation. The  mitral valve is normal in structure. Normal mobility of the mitral valve  leaflets. Mild to moderate mitral valve regurgitation. No evidence of  mitral valve stenosis.   Tricuspid Valve: The tricuspid valve is normal in structure. Tricuspid  valve regurgitation is mild to moderate. No evidence of tricuspid  stenosis.   Aortic Valve: No perivalvular regurgitation or abscess. The aortic valve  has been repaired/replaced. Aortic valve regurgitation is trivial. No  aortic stenosis is present. There is a 23 mm Homograft valve/aortic root  valve present in the aortic position.  Procedure Date: 04/16/2020. Echo findings are consistent with normal  structure and function of the aortic valve prosthesis.  Pulmonic Valve: The pulmonic valve was normal in structure. Pulmonic valve  regurgitation is trivial. No evidence of pulmonic stenosis.   Aorta: The aortic root is normal in size and structure. There is minimal  (Grade I) plaque involving the descending aorta.   Venous: The inferior vena cava is normal in size with greater than 50%  respiratory variability, suggesting right atrial pressure of 3 mmHg.   IAS/Shunts: No atrial level shunt detected by color flow Doppler. There is  no evidence of a patent foramen ovale. There is no evidence of an atrial  septal defect.     MR Peak grad:  75.3 mmHg  TRICUSPID VALVE  MR Mean grad:  42.0 mmHg  TR Peak grad:  18.1 mmHg  MR Vmax:     434.00 cm/s TR Vmax:    213.00 cm/s  MR Vmean:    296.0 cm/s  MR PISA:     1.01 cm  MR PISA Eff ROA: 9 mm  MR PISA Radius: 0.40 cm  Patient Profile     69 y.o. 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 now s/p debridement of aortic root abscess and aortic root replacement on 04/16/2020,  paroxysmal atrial fibrillation on Eliquis,recurrent bilateral knee effusionsand septic arthritis with arthrocentesis (6/10),hypertension, dyslipidemia, diabetes on insulin, hypothyroidism, lumbar stenosis with neurogenic claudication, and cervical decompression in 3/2021and a fib in rehab and back to main hospital at [facility] for amiodarone and thought to repeat DCCV in future if a fib persists and infection improves (this was written 02/05/20)now with volume overload and RVR in his a fib.    Assessment & Plan    1. Persisitent AF with fluid volume overload: -Rates stable today per ascultation>>not currently on telemetry  -Continue amiodarone 200mg  PO QD, metoprolol 12.5mg  po BID, Eliquis  -Volume down from yesterday  -Weight, 198lb today>>>down from 204lb 05/19/20 -I&O, net negative 10.6L  -Continue IV Lasix 40mg  BID   2. Anemia: -Follow closely given AC with Eliquis  -Hb today, 8.1>>stable from 7.9 yesterday  -Management per primary team   3.  AKI: -Stabilizing>>creatininei 1.36 today -Daily BMET   4. Recent septic knee/osetomylitis L4-5: -PICC for IV abx>>stable  -Plan for team conference for goals of care and plan for home management  -Mangement per primary team    Signed, Kathyrn Drown NP-C [facility] Pager: [phone] 05/20/2020, 9:42 AM     For questions or updates, please contact   Please consult [email or web address] for contact info under Cardiology/STEMI.

## 2020-05-20 NOTE — Progress Notes (Signed)
Occupational Therapy Session Note  Patient Details  Name: Johnathan Murray MRN: [id number] Date of Birth: 1951/07/29  Today's Date: 05/20/2020 OT Individual Time: 7494-4967 OT Individual Time Calculation (min): 59 min    Short Term Goals: Week 1:  OT Short Term Goal 1 (Week 1): Pt will don pants with modA and LRAD PRN. OT Short Term Goal 2 (Week 1): Pt will complete toilet transfer to [facility] with modA. OT Short Term Goal 3 (Week 1): Pt will adhere to sternal precautions with min verbal cues during ADL task. OT Short Term Goal 4 (Week 1): Pt will complete bathing at sit > stand level with mod assist of one caregiver  Skilled Therapeutic Interventions/Progress Updates:    Treatment session with focus on ADL retraining with sit > stand and education on use of AE for LB dressing.  Pt received supine in bed reporting pain 5/10 in lower back but agreeable to session.  Completed bed mobility with CGA with use of bed rails.  Pt reports PA cleared him of his sternal precautions.  Discussed setup of home bed and possible need for additional pillows or wedge if pt is unable to lie flat in bed.  Engaged in bathing seated at EOB with pt requiring 2 supine rest breaks due to increase in lower back pain.  Min cues for use of reacher with LB dressing, pt able to ultimately thread BLE but required assistance to pull pants up towards knees and then when pulling pants over hips in standing.  Min assist sit > stand from elevated EOB with RW.  Engaged in blocked practice sit > stand while lowering height of bed minimally each time.  Pt continues to require min-mod assist for sit > stand from elevated surface.  Engaged in weight shifting and marching in place as needed for dynamic standing balance for LB dressing.  Completed side steps towards [facility] with min assist prior to returning to supine in bed.  Mod assist to lift BLE in to bed.  Pt remained semi-reclined in bed with all needs in reach.  Therapy  Documentation Precautions:  Precautions Precautions: Fall Precaution Comments: freq cues to adhere to sternal prec Required Braces or Orthoses: Spinal Brace Spinal Brace: Lumbar corset Other Brace: for comfort Restrictions Weight Bearing Restrictions: No Other Position/Activity Restrictions: sternal precautions General:   Vital Signs:   Pain: Pain Assessment Pain Scale: 0-10 Pain Score: 5  Pain Type: Acute pain Pain Location: Back Pain Orientation: Lower Pain Descriptors / Indicators: Aching;Discomfort;Tender Pain Frequency: Constant Pain Onset: On-going Patients Stated Pain Goal: 2 Pain Intervention(s): Medication (See eMAR);RN made aware;Repositioned;Distraction;Emotional support;Environmental changes (PRN Oxy 5 mg IR) ADL: ADL Grooming: Supervision/safety Where Assessed-Grooming: Edge of bed Upper Body Bathing: Supervision/safety Where Assessed-Upper Body Bathing: Edge of bed Lower Body Bathing: Moderate assistance (+2 for sit > stand) Where Assessed-Lower Body Bathing: Edge of bed Upper Body Dressing: Minimal assistance (hospital gown due to PICC line) Where Assessed-Upper Body Dressing: Edge of bed Lower Body Dressing: Dependent (Mod A +2 for sit > stand) Where Assessed-Lower Body Dressing: Edge of bed (sit to stand level) Vision   Perception    Praxis   Exercises:   Other Treatments:     Therapy/Group: Individual Therapy  Simonne Come 05/20/2020, 9:57 AM

## 2020-05-20 NOTE — Progress Notes (Signed)
Patient ID: Johnathan Murray, male   DOB: 1950-12-22, 69 y.o.   MRN: [id number]   Team Conference Report to Patient/Family  Team Conference discussion was reviewed with the patient and caregiver, including goals, any changes in plan of care and target discharge date.  Patient and caregiver express understanding and are in agreement.  The patient has a target discharge date of 06/04/20.  [name] 05/20/2020, 1:58 PM

## 2020-05-20 NOTE — Patient Care Conference (Signed)
Inpatient RehabilitationTeam Conference and Plan of Care Update Date: 05/20/2020   Time: 2:34 PM    Patient Name: Johnathan Murray      Medical Record Number: [id number]  Date of Birth: July 21, 1951 Sex: Male         Room/Bed: 4W10C/4W10C-01 Payor Info: Payor: BLUE [place] BLUE SHIELD / Plan: BCBS COMM PPO / Product Type: *No Product type* /    Admit Date/Time:  05/14/2020  3:19 PM  Primary Diagnosis:  Debility  Patient Active Problem List   Diagnosis Date Noted  . Anasarca   . History of prolonged Q-T interval on ECG   . Peripheral edema   . Labile blood glucose   . Atrial fibrillation with RVR ([place])   . Postoperative pain   . Transaminitis   . Hypoalbuminemia due to protein-calorie malnutrition ([place])   . Drug induced constipation   . Controlled type 2 diabetes mellitus with hyperglycemia, with long-term current use of insulin ([place])   . AKI (acute kidney injury) ([place])   . Pressure injury of skin 05/14/2020  . Debility 05/14/2020  . Lumbar discitis 05/04/2020  . Acute blood loss anemia 05/02/2020  . Prolonged QT interval 05/02/2020  . Transient hypotension 05/02/2020  . Persistent atrial fibrillation ([place])   . Prosthetic valve endocarditis ([place]) 04/13/2020  . Aortic valve abscess 04/13/2020  . Endocarditis 04/13/2020  . Typical atrial flutter ([place])   . Discitis 04/07/2020  . Moderate protein-calorie malnutrition ([place]) 03/24/2020  . Septic arthritis of knee, bilateral ([place]) 03/23/2020  . History of fusion of cervical spine 03/23/2020  . Bacteremia due to Enterococcus 03/21/2020  . Pseudogout of left knee 03/21/2020  . ARF (acute renal failure) ([place]) 03/20/2020  . S/P AVR 01/17/2020  . Renal insufficiency 01/17/2020  . Pre-operative cardiovascular examination 01/03/2020  . Acute otitis externa of left ear 11/13/2019  . Acute suppurative otitis media of left ear without spontaneous rupture of tympanic membrane 11/13/2019  . Conductive hearing loss of left ear 11/13/2019  .  Tinnitus of left ear 11/13/2019  . Lumbar stenosis with neurogenic claudication 06/11/2019  . Cervical myelopathy ([place]) 04/22/2019  . Preoperative cardiovascular examination 04/17/2019  . Intermittent claudication ([place]) 10/31/2018  . Coronary artery disease of native artery of native heart with stable angina pectoris ([place])   . Abnormal nuclear stress test 05/03/2018  . Aortic stenosis 05/03/2018  . Acid reflux 02/15/2018  . Arthritis 02/15/2018  . Hyperactive gag reflex 02/15/2018  . Macular degeneration 02/15/2018  . Abnormal LFTs (liver function tests) 10/31/2016  . Coronary artery disease involving native coronary artery of native heart without angina pectoris 05/30/2016  . Diabetes mellitus due to underlying condition with unspecified complications ([place]) 27/74/1287  . Dyslipidemia 05/30/2016  . Essential hypertension 05/30/2016  . PAF (paroxysmal atrial fibrillation) ([place]) 05/30/2016    Expected Discharge Date: Expected Discharge Date: 06/04/20  Team Members Present: Physician leading conference: Dr. Delice Lesch Care Coodinator Present: Dorien Chihuahua, RN, BSN, CRRN;Christina Sampson Goon, BSW Nurse Present: Rayne Du, LPN PT Present: Becky Sax, PT OT Present: Simonne Come, OT PPS Coordinator present : Gunnar Fusi, SLP     Current Status/Progress Goal Weekly Team Focus  Bowel/Bladder   Patient is continent of bowel and bladder.  LBM 05/19/20  Patient will maintain GI/GU continence during inpatient rehab stay  Assess GI/GU status, particularly UOP with Lasix, Qshift and PRN   Swallow/Nutrition/ Hydration             ADL's   Mod A sit > stand and  stand pivot transfers from elevated surface, Max - total A LB bathing/dressing, Min A UB bathing/dressing  supervision-CGA  pain management, adl retraining, transfer training, pt/family education, endurance   Mobility   bed mobility mod A, sit<>stand and stand<>pivot with RW min/mod A from elevated surfaces, gait 59ft with  RW mod A +2  Mod I, CGA for car transfers, mod A for steps  functional mobility/transfers, generalized strengthening, dynamic standing balance/coordination, ambulation, and endurance   Communication             Safety/Cognition/ Behavioral Observations            Pain   Patient's pain managed with scheduled Oxycontin 20 mg ER and Roxicodone 5 mg IR.  K-pad placed with moderate relief of sx reported.  Patient will maintain pain level less than 4 and have increased activity tolerance  Assess pain Qshift and PRN.  Offer non-pharm interventions with pharmacological measures.   Skin   Patient's skin is intact with scattered subcutaneous bruising. MASD to buttocks.  Patient will maintain skin integrity and be free of infection and breakdown during inpatient rehab stay  Assess skin Qshift and PRN.  Focus on hydration, nutrition, and skin care to promote and support skin integrity.    Rehab Goals Patient on target to meet rehab goals: Yes Rehab Goals Revised: patient on target with current goals *See Care Plan and progress notes for long and short-term goals.     Barriers to Discharge  Current Status/Progress Possible Resolutions Date Resolved   Nursing                  PT  Home environment access/layout;Inaccessible home environment;Decreased caregiver support;IV antibiotics;Weight bearing restrictions  fatigue, poor endurance, sternal precautions, back pain              OT Home environment access/layout;Medical stability  sternal precautions, back pain, limited endurance, cardiac issues             SLP                Care Coordinator Lack of/limited family support;Home environment access/layout Spouse working 6:30-4, 8 steps            Discharge Planning/Teaching Needs:  Patient plans to discharge home with spouse (spouse cannot do heavy lifting, works 6:30-3PM)  Will schedule with spouse if required   Team Discussion:  Plan for discharge home with wife who is very supportive. Low back  pain and medical issues limit progress (tachycardia, third spacing = MD ordered IV lasix, IV albumin and labs to check Hgb and creatinine levels. Currently mod assist +2 for transfers and minimal assistance for UB B+D with Maximal assistance needed for LB B+D. Clarification of sternal precautions requested as patient is using his arms more. Stage 2 wound on sacrum is healing.  Revisions to Treatment Plan:  Downgraded Mod I goals set for discharge. MD clarified sternal precautions remain in place. Practice for 8 step entry to the home.    Medical Summary Current Status: Decreased functional mobility secondary to sepsis/Enterococcus faecalis bacteremia/discitis/osteomyelitis of L4-5 status post aortic root replacement 04/17/2020 status post cardioversion for atrial fibrillation Weekly Focus/Goal: Improve mobility, pain, bacteremia, AKI, PAF, labile blood glucose, peripheral edema  Barriers to Discharge: Medical stability;IV antibiotics;Wound care   Possible Resolutions to Barriers: Therapies, optimize pain meds, follow HR, optimize DM meds, follow labs, cards recs   Continued Need for Acute Rehabilitation Level of Care: The patient requires daily medical management by a physician with  specialized training in physical medicine and rehabilitation for the following reasons: Direction of a multidisciplinary physical rehabilitation program to maximize functional independence : Yes Medical management of patient stability for increased activity during participation in an intensive rehabilitation regime.: Yes Analysis of laboratory values and/or radiology reports with any subsequent need for medication adjustment and/or medical intervention. : Yes   I attest that I was present, lead the team conference, and concur with the assessment and plan of the team.   Dorien Chihuahua B 05/20/2020, 2:34 PM

## 2020-05-20 NOTE — Progress Notes (Addendum)
[facility] PHYSICAL MEDICINE & REHABILITATION PROGRESS NOTE  Subjective/Complaints: Patient seen sitting up in bed this morning.  He states he slept fairly overnight due to the hospital bed.  Wife notes that patient naps during the day after therapies.  He was seen by cardiology yesterday, notes reviewed-repeated IV Lasix albumin Lasix.  ROS: Denies shortness of breath, CP, N/V/D  Objective: Vital Signs: Blood pressure 106/68, pulse 85, temperature 98.4 F (36.9 C), temperature source Oral, resp. rate 16, height 5\' 9"  (1.753 m), weight 90.1 kg, SpO2 99 %. No results found. Recent Labs    05/18/20 1044 05/20/20 0450  WBC 8.4 6.9  HGB 7.9* 8.1*  HCT 26.7* 27.0*  PLT 349 346   Recent Labs    05/19/20 0421 05/20/20 0450  NA 138 138  K 3.8 3.7  CL 101 100  CO2 27 28  GLUCOSE 109* 153*  BUN 21 22  CREATININE 1.31* 1.36*  CALCIUM 8.9 8.7*    Physical Exam: BP 106/68 (BP Location: Left Arm)   Pulse 85   Temp 98.4 F (36.9 C) (Oral)   Resp 16   Ht 5\' 9"  (1.753 m)   Wt 90.1 kg   SpO2 99%   BMI 29.33 kg/m  Constitutional: No distress . Vital signs reviewed. HENT: Normocephalic.  Atraumatic. Eyes: EOMI. No discharge. Cardiovascular: No JVD. Irregular rhythm. Rate controlled.  Respiratory: Normal effort.  No stridor. Bilaterally clear to auscultation. GI: Non-distended. BS+. Skin: Right knee with staples C/D/I Left knee with staples C/D/I, erythema along staple lines improving Psych: Normal mood.  Normal behavior. Musc: Left > right knee with edema and tenderness, improving Bilateral lower extremity edema with some improvement No TTP over back Neuro: Alert Motor:  Right lower extremity: Hip flexion, knee extension 3/5, ankle dorsiflexion 4+/5, stable Left lower extremity: Hip flexion, knee extension 2/5, ankle dorsiflexion 4+/5  Assessment/Plan: 1. Functional deficits secondary to debility due to septic knees, disciitis which require 3+ hours per day of  interdisciplinary therapy in a comprehensive inpatient rehab setting.  Physiatrist is providing close team supervision and 24 hour management of active medical problems listed below.  Physiatrist and rehab team continue to assess barriers to discharge/monitor patient progress toward functional and medical goals  Care Tool:  Bathing  Bathing activity did not occur: Refused Body parts bathed by patient: Right arm, Left arm, Chest, Abdomen, Right upper leg, Left upper leg, Face   Body parts bathed by helper: Right lower leg, Left lower leg     Bathing assist Assist Level: Minimal Assistance - Patient > 75%     Upper Body Dressing/Undressing Upper body dressing   What is the patient wearing?: Pull over shirt    Upper body assist Assist Level: Minimal Assistance - Patient > 75%    Lower Body Dressing/Undressing Lower body dressing      What is the patient wearing?: Pants     Lower body assist Assist for lower body dressing: Minimal Assistance - Patient > 75%     Toileting Toileting Toileting Activity did not occur (Clothing management and hygiene only): N/A (no void or bm)  Toileting assist Assist for toileting: Maximal Assistance - Patient 25 - 49%     Transfers Chair/bed transfer  Transfers assist     Chair/bed transfer assist level: Minimal Assistance - Patient > 75%     Locomotion Ambulation   Ambulation assist      Assist level: 2 helpers Assistive device: Walker-platform Max distance: 30   Walk 10 feet  activity   Assist     Assist level: 2 helpers Assistive device: Walker-platform   Walk 50 feet activity   Assist Walk 50 feet with 2 turns activity did not occur: Safety/medical concerns         Walk 150 feet activity   Assist Walk 150 feet activity did not occur: Safety/medical concerns         Walk 10 feet on uneven surface  activity   Assist Walk 10 feet on uneven surfaces activity did not occur: Safety/medical  concerns         Wheelchair     Assist Will patient use wheelchair at discharge?: No             Wheelchair 50 feet with 2 turns activity    Assist            Wheelchair 150 feet activity     Assist            Medical Problem List and Plan: 1.  Decreased functional mobility secondary to sepsis/Enterococcus faecalis bacteremia/discitis/osteomyelitis of L4-5 status post aortic root replacement 04/17/2020 status post cardioversion for atrial fibrillation.  Sternal precautions/lumbar corset for comfort x 4 more weeks  Continue CIR  Team conference today to discuss current and goals and coordination of care, home and environmental barriers, and discharge planning with nursing, case manager, and therapies.  2.  Antithrombotics: -DVT/anticoagulation: Resumed Eliquis 05/15/2020             -antiplatelet therapy: N/A 3. Pain Management: OxyContin sustained release 10 mg every 12 hours, Lyrica 100 mg 3 times daily Flexeril as as well as oxycodone needed  Relatively controlled on 6/30  Lidoderm patch ordered on 6/30 4. Mood: N/A meds- follow for ego support             -antipsychotic agents: N/A 5. Neuropsych: This patient is capable of making decisions on his own behalf. 6. Skin/Wound Care: Routine skin checks 7. Fluids/Electrolytes/Nutrition: Routine in and outs. 8.  ID/Enterococcus bacteremia.  Continue IV ampicillin 2 g every 4 hours as well as Rocephin 2 g every 12 hours through 6/30.  Follow-up infectious disease 9.  Acute on chronic anemia.  Continue iron supplement.    Hb 8.1 on 6/30  Cont to monitor 10.  Bilateral septic knees.  Status post total synovectomy irrigation debridement bilateral knees 05/02/2020 per Dr. Sharol Given.  Weightbearing as tolerated.   Continue ice to bilateral knees  11.  AKI.     Creatinine 1.36 on 6/30 12.  PAF.  Follow-up cardiology services.  Amiodarone as directed.    Continue Lopressor 12.5 mg twice daily.    Monitor with increased  exertion  Repeat ECG reviewed, showing a flutter with prolonged QTC  Rate controlled on 6/30 13.  Diabetes mellitus with hyperglycemia.  Latest hemoglobin A1c 6.8.  Lantus insulin 10 units twice daily.   CBG (last 3)  Recent Labs    05/19/20 2025 05/19/20 2124 05/20/20 0622  GLUCAP 152* 165* 112*    Slightly labile on 6/30  Monitor with increased mobility 14.  Recent C3-4 ACDF 03/02/2020 per [name] for cervical myelopathy.  Cervical collar discontinued 15.  Hypothyroidism.  Synthroid 16. Drug-induced constipation.  MiraLAX daily, Senokot nightly  Adjust bowel meds as necessary 17.  Severe hypoalbuminemia  Supplement initiated on 6/25  IV supplementation x2 18.  Transaminitis: Resolved  LFTs within normal limits on 6/29 19.  Peripheral edema  See #12, #19  IV Lasix and IV albumin per  cardiology, additional dose given on 6/29, transitioned to p.o. Lasix on 6/30  Echo reviewed from 6/9-showing normal EF  BNP elevated 20.  Prolonged QTC  Monitor in conjunction with other medications  Appreciate cards recs 21.  Hypothyroidism  TSH elevated on 6/29  LOS: 6 days A FACE TO FACE EVALUATION WAS PERFORMED  Pearley Millington Lorie Phenix 05/20/2020, 8:55 AM

## 2020-05-20 NOTE — Progress Notes (Signed)
Occupational Therapy Session Note  Patient Details  Name: Johnathan Murray MRN: [id number] Date of Birth: 06/17/1951  Today's Date: 05/20/2020 OT Individual Time: 1310-1407 OT Individual Time Calculation (min): 57 min    Short Term Goals: Week 1:  OT Short Term Goal 1 (Week 1): Pt will don pants with modA and LRAD PRN. OT Short Term Goal 2 (Week 1): Pt will complete toilet transfer to [facility] with modA. OT Short Term Goal 3 (Week 1): Pt will adhere to sternal precautions with min verbal cues during ADL task. OT Short Term Goal 4 (Week 1): Pt will complete bathing at sit > stand level with mod assist of one caregiver  Skilled Therapeutic Interventions/Progress Updates:    Treatment session with focus on functional transfers, sit > stand, and functional ambulation.  Pt received supine in bed reporting pain in lower back, premedicated just before session. Completed bed mobility with CGA to come to sitting at EOB.  Engaged in sit > stand with min assist from elevated EOB.  Completed stand pivot transfer bed > w/c with mod assist and multimodal cues for sequencing of transfer.  Pt ambulated 7' x2 with Mod assist and +2 for close w/c follow.  Pt with reports of increased fatigue requiring seated rest break.  Pt voicing frustration with inability to ambulate as far as he did during PT session.  Discussed progress from entire day and from day to day to provide encouragement.  Pt declined further therapeutic activity.  Therapist obtained elongated BSC from pt to utilize for future toileting needs.  Discussed with pt and wife most likely needing to purchase BSC or toilet riser if need for elongated toilet seat.  Pt returned to EOB Mod assist stand pivot and then mod assist to lift BLE in to bed.  Pt remained semi-reclined in bed with all needs in reach and wife present.  Therapy Documentation Precautions:  Precautions Precautions: Fall Precaution Comments: freq cues to adhere to sternal prec Required  Braces or Orthoses: Spinal Brace Spinal Brace: Lumbar corset Other Brace: for comfort Restrictions Weight Bearing Restrictions: No Other Position/Activity Restrictions: sternal precautions General:   Vital Signs: Therapy Vitals Temp: 98.7 F (37.1 C) Pulse Rate: 72 Resp: 16 BP: (!) 96/54 Patient Position (if appropriate): Lying Oxygen Therapy SpO2: 97 % O2 Device: Room Air Pain:  Pt with c/o pain in lower back, rated 6/10. Premedicated.   Therapy/Group: Individual Therapy  Simonne Come 05/20/2020, 2:25 PM

## 2020-05-21 ENCOUNTER — Inpatient Hospital Stay (HOSPITAL_COMMUNITY): Payer: BC Managed Care – PPO

## 2020-05-21 ENCOUNTER — Inpatient Hospital Stay (HOSPITAL_COMMUNITY): Payer: BC Managed Care – PPO | Admitting: Occupational Therapy

## 2020-05-21 DIAGNOSIS — I9589 Other hypotension: Secondary | ICD-10-CM

## 2020-05-21 DIAGNOSIS — E876 Hypokalemia: Secondary | ICD-10-CM

## 2020-05-21 DIAGNOSIS — E861 Hypovolemia: Secondary | ICD-10-CM

## 2020-05-21 LAB — GLUCOSE, CAPILLARY
Glucose-Capillary: 134 mg/dL — ABNORMAL HIGH (ref 70–99)
Glucose-Capillary: 201 mg/dL — ABNORMAL HIGH (ref 70–99)
Glucose-Capillary: 74 mg/dL (ref 70–99)
Glucose-Capillary: 189 mg/dL — ABNORMAL HIGH (ref 70–99)

## 2020-05-21 LAB — CBC WITH DIFFERENTIAL/PLATELET
Abs Immature Granulocytes: 0.02 10*3/uL (ref 0.00–0.07)
Basophils Absolute: 0.1 10*3/uL (ref 0.0–0.1)
Basophils Relative: 1 %
Eosinophils Absolute: 0.3 10*3/uL (ref 0.0–0.5)
Eosinophils Relative: 5 %
HCT: 26.5 % — ABNORMAL LOW (ref 39.0–52.0)
Hemoglobin: 7.9 g/dL — ABNORMAL LOW (ref 13.0–17.0)
Immature Granulocytes: 0 %
Lymphocytes Relative: 13 %
Lymphs Abs: 0.8 10*3/uL (ref 0.7–4.0)
MCH: 27.7 pg (ref 26.0–34.0)
MCHC: 29.8 g/dL — ABNORMAL LOW (ref 30.0–36.0)
MCV: 93 fL (ref 80.0–100.0)
Monocytes Absolute: 0.4 10*3/uL (ref 0.1–1.0)
Monocytes Relative: 6 %
Neutro Abs: 4.7 10*3/uL (ref 1.7–7.7)
Neutrophils Relative %: 75 %
Platelets: 321 10*3/uL (ref 150–400)
RBC: 2.85 MIL/uL — ABNORMAL LOW (ref 4.22–5.81)
RDW: 17.2 % — ABNORMAL HIGH (ref 11.5–15.5)
WBC: 6.3 10*3/uL (ref 4.0–10.5)
nRBC: 0 % (ref 0.0–0.2)

## 2020-05-21 LAB — BASIC METABOLIC PANEL
Anion gap: 6 (ref 5–15)
BUN: 23 mg/dL (ref 8–23)
CO2: 30 mmol/L (ref 22–32)
Calcium: 8.7 mg/dL — ABNORMAL LOW (ref 8.9–10.3)
Chloride: 101 mmol/L (ref 98–111)
Creatinine, Ser: 1.32 mg/dL — ABNORMAL HIGH (ref 0.61–1.24)
GFR calc Af Amer: >60 mL/min (ref >60)
GFR calc non Af Amer: 55 mL/min — ABNORMAL LOW (ref >60)
Glucose, Bld: 164 mg/dL — ABNORMAL HIGH (ref 70–99)
Potassium: 3.6 mmol/L (ref 3.5–5.1)
Sodium: 137 mmol/L (ref 135–145)

## 2020-05-21 MED ORDER — FUROSEMIDE 40 MG PO TABS: 40.0000 mg | ORAL_TABLET | Freq: Two times a day (BID) | ORAL | Status: DC

## 2020-05-21 MED ORDER — FUROSEMIDE 40 MG PO TABS
40.0000 mg | ORAL_TABLET | Freq: Two times a day (BID) | ORAL | Status: DC
  Administered 2020-05-21 – 2020-05-25 (×8): 40 mg via ORAL
  Filled 2020-05-21 (×8): qty 1

## 2020-05-21 MED ORDER — POTASSIUM CHLORIDE CRYS ER 20 MEQ PO TBCR
20.0000 meq | EXTENDED_RELEASE_TABLET | Freq: Two times a day (BID) | ORAL | Status: DC
  Administered 2020-05-21 – 2020-06-04 (×28): 20 meq via ORAL
  Filled 2020-05-21 (×28): qty 1

## 2020-05-21 NOTE — Plan of Care (Signed)
  Problem: Consults Goal: RH GENERAL PATIENT EDUCATION Description: See Patient Education module for education specifics. Outcome: Progressing Goal: Skin Care Protocol Initiated - if Braden Score 18 or less Description: If consults are not indicated, leave blank or document N/A Outcome: Progressing   Problem: RH BOWEL ELIMINATION Goal: RH STG MANAGE BOWEL WITH ASSISTANCE Description: STG Manage Bowel with mod I Assistance. Outcome: Progressing Goal: RH STG MANAGE BOWEL W/MEDICATION W/ASSISTANCE Description: STG Manage Bowel with Medication with mod I Assistance. Outcome: Progressing   Problem: RH SKIN INTEGRITY Goal: RH STG MAINTAIN SKIN INTEGRITY WITH ASSISTANCE Description: STG Maintain Skin Integrity With min Assistance. Outcome: Progressing Goal: RH STG ABLE TO PERFORM INCISION/WOUND CARE W/ASSISTANCE Description: STG Able To Perform Incision/Wound Care With min Assistance. Outcome: Progressing   Problem: RH SAFETY Goal: RH STG ADHERE TO SAFETY PRECAUTIONS W/ASSISTANCE/DEVICE Description: STG Adhere to Safety Precautions With cues/reminders Assistance/Device. Outcome: Progressing   Problem: RH PAIN MANAGEMENT Goal: RH STG PAIN MANAGED AT OR BELOW PT'S PAIN GOAL Description: Less than 4  Outcome: Progressing   Problem: Consults Goal: RH GENERAL PATIENT EDUCATION Description: See Patient Education module for education specifics. Outcome: Progressing

## 2020-05-21 NOTE — Progress Notes (Signed)
Patient's blood pressures have been soft with current regimen of Lasix 40 mg BID and Metoprolol 12.5 mg BID.  Request for MD to evaluate for possible parameters with beta blocker and diuretic administrations.  Chanelle Hodsdon B. Rulon Eisenmenger, MSN, RN, CNL

## 2020-05-21 NOTE — Progress Notes (Signed)
[place] PHYSICAL MEDICINE & REHABILITATION PROGRESS NOTE  Subjective/Complaints: Patient seen laying in bed this morning.  He states he slept well overnight.  He states he is working hard towards his discharge date so he is ready to leave.  He was seen by cardiology yesterday, notes reviewed-last dose of albumin given yesterday, continue with IV Lasix.  Wife at bedside.  Nursing notes reviewed-low blood pressures overnight.  ROS: Denies shortness of breath, CP, N/V/D  Objective: Vital Signs: Blood pressure (!) 124/93, pulse 94, temperature 97.9 F (36.6 C), temperature source Oral, resp. rate 18, height 5\' 9"  (1.753 m), weight 91 kg, SpO2 100 %. No results found. Recent Labs    05/20/20 0450 05/21/20 0304  WBC 6.9 6.3  HGB 8.1* 7.9*  HCT 27.0* 26.5*  PLT 346 321   Recent Labs    05/20/20 0450 05/21/20 0304  NA 138 137  K 3.7 3.6  CL 100 101  CO2 28 30  GLUCOSE 153* 164*  BUN 22 23  CREATININE 1.36* 1.32*  CALCIUM 8.7* 8.7*    Physical Exam: BP (!) 124/93 (BP Location: Left Arm)    Pulse 94    Temp 97.9 F (36.6 C) (Oral)    Resp 18    Ht 5\' 9"  (1.753 m)    Wt 91 kg    SpO2 100%    BMI 29.63 kg/m  Constitutional: No distress . Vital signs reviewed. HENT: Normocephalic.  Atraumatic. Eyes: EOMI. No discharge. Cardiovascular: Irregular rhythm. Respiratory: Normal effort.  No stridor.  Bilaterally clear to auscultation GI: Non-distended.  BS + Skin: Right knee with staples C/D/I Left knee with staples C/D/I Psych: Normal mood.  Normal behavior. Musc: Bilateral knee edema with tenderness, improving Lower extremity edema improving Neuro: Alert Motor:  Right lower extremity: Hip flexion, knee extension 3/5, ankle dorsiflexion 4+/5, stable Left lower extremity: Hip flexion, knee extension 2/5, ankle dorsiflexion 4+/5, unchanged  Assessment/Plan: 1. Functional deficits secondary to debility due to septic knees, disciitis which require 3+ hours per day of  interdisciplinary therapy in a comprehensive inpatient rehab setting.  Physiatrist is providing close team supervision and 24 hour management of active medical problems listed below.  Physiatrist and rehab team continue to assess barriers to discharge/monitor patient progress toward functional and medical goals  Care Tool:  Bathing  Bathing activity did not occur: Refused Body parts bathed by patient: Right arm, Left arm, Chest, Abdomen, Right upper leg, Left upper leg, Face   Body parts bathed by helper: Right lower leg, Left lower leg     Bathing assist Assist Level: Minimal Assistance - Patient > 75%     Upper Body Dressing/Undressing Upper body dressing   What is the patient wearing?: Pull over shirt, Orthosis    Upper body assist Assist Level: Moderate Assistance - Patient 50 - 74%    Lower Body Dressing/Undressing Lower body dressing      What is the patient wearing?: Pants     Lower body assist Assist for lower body dressing: Moderate Assistance - Patient 50 - 74%     Toileting Toileting Toileting Activity did not occur Landscape architect and hygiene only): N/A (no void or bm)  Toileting assist Assist for toileting: Maximal Assistance - Patient 25 - 49%     Transfers Chair/bed transfer  Transfers assist     Chair/bed transfer assist level: Minimal Assistance - Patient > 75%     Locomotion Ambulation   Ambulation assist      Assist level: 2  helpers Assistive device: Walker-rolling Max distance: 13ft   Walk 10 feet activity   Assist     Assist level: 2 helpers Assistive device: Walker-rolling   Walk 50 feet activity   Assist Walk 50 feet with 2 turns activity did not occur: Safety/medical concerns         Walk 150 feet activity   Assist Walk 150 feet activity did not occur: Safety/medical concerns         Walk 10 feet on uneven surface  activity   Assist Walk 10 feet on uneven surfaces activity did not occur:  Safety/medical concerns         Wheelchair     Assist Will patient use wheelchair at discharge?: No             Wheelchair 50 feet with 2 turns activity    Assist            Wheelchair 150 feet activity     Assist            Medical Problem List and Plan: 1.  Decreased functional mobility secondary to sepsis/Enterococcus faecalis bacteremia/discitis/osteomyelitis of L4-5 status post aortic root replacement 04/17/2020 status post cardioversion for atrial fibrillation.  Sternal precautions/lumbar corset for comfort x 4 more weeks  Continue CIR 2.  Antithrombotics: -DVT/anticoagulation: Resumed Eliquis 05/15/2020             -antiplatelet therapy: N/A 3. Pain Management: OxyContin sustained release 10 mg every 12 hours, Lyrica 100 mg 3 times daily Flexeril as as well as oxycodone needed  Relatively controlled on 7/1  Lidoderm patch ordered on 6/30 4. Mood: N/A meds- follow for ego support             -antipsychotic agents: N/A 5. Neuropsych: This patient is capable of making decisions on his own behalf. 6. Skin/Wound Care: Routine skin checks 7. Fluids/Electrolytes/Nutrition: Routine in and outs. 8.  ID/Enterococcus bacteremia.  Continue IV ampicillin 2 g every 4 hours as well as Rocephin 2 g every 12 hours completed on 6/30.  Follow-up infectious disease 9.  Acute on chronic anemia.  Continue iron supplement.    Hb 7.9 on 7/1  Cont to monitor 10.  Bilateral septic knees.  Status post total synovectomy irrigation debridement bilateral knees 05/02/2020 per Dr. Sharol Given.  Weightbearing as tolerated.   Continue ice to bilateral knees  11.  AKI.     Creatinine 1.32 on 7/1 12.  PAF.  Follow-up cardiology services.  Amiodarone as directed.    Continue Lopressor 12.5 mg twice daily.    Monitor with increased exertion  Repeat ECG reviewed, showing a flutter with prolonged QTC  Rate controlled on 7/1 13.  Diabetes mellitus with hyperglycemia.  Latest hemoglobin A1c  6.8.  Lantus insulin 10 units twice daily.   CBG (last 3)  Recent Labs    05/20/20 1153 05/20/20 2117 05/21/20 0617  GLUCAP 163* 139* 134*    Slightly labile on 7/1  Monitor with increased mobility 14.  Recent C3-4 ACDF 03/02/2020 per [name] for cervical myelopathy.  Cervical collar discontinued 15.  Hypothyroidism.  Synthroid 16. Drug-induced constipation.  MiraLAX daily, Senokot nightly  Adjust bowel meds as necessary 17.  Severe hypoalbuminemia  Supplement initiated on 6/25  IV supplementation x3 18.  Transaminitis: Resolved  LFTs within normal limits on 6/29 19.  Peripheral edema  See #12, #19  IV Lasix and IV albumin per cardiology, additional dose given on 6/29, transitioned to p.o. Lasix on 6/30  Echo  reviewed from 6/9-showing normal EF  BNP elevated  Filed Weights   05/19/20 0617 05/20/20 0459 05/21/20 0508  Weight: 92.6 kg 90.1 kg 91 kg   20.  Prolonged QTC  Monitor in conjunction with other medications  Appreciate cards recs 21.  Hypothyroidism  TSH elevated on 6/29 22.  Borderline soft blood pressures  See #12, will consider changing medications if symptomatic 23.  Hypokalemia-secondary to diuresis  Potassium 3.6 on 7/1 and trending down  Supplement increased on 7/1  LOS: 7 days A FACE TO FACE EVALUATION WAS PERFORMED  Corazon Nickolas Lorie Phenix 05/21/2020, 8:41 AM

## 2020-05-21 NOTE — Progress Notes (Signed)
Occupational Therapy Session Note  Patient Details  Name: Johnathan Murray MRN: [id number] Date of Birth: [birth year]  Today's Date: 05/21/2020 OT Individual Time: 6578-4696  &  1300-1415 OT Individual Time Calculation (min): 56 min &  75 min   Short Term Goals: Week 1:  OT Short Term Goal 1 (Week 1): Pt will don pants with modA and LRAD PRN. OT Short Term Goal 2 (Week 1): Pt will complete toilet transfer to [facility] with modA. OT Short Term Goal 3 (Week 1): Pt will adhere to sternal precautions with min verbal cues during ADL task. OT Short Term Goal 4 (Week 1): Pt will complete bathing at sit > stand level with mod assist of one caregiver  Skilled Therapeutic Interventions/Progress Updates:    AM session:   Patient in bed, alert and ready for therapy.  Wife present for session.  He denies pain when in bed.  He requires max A for teds and slipper socks.  Donning pants bed level mod A - he is able to complete clothing management in supine with min A.  Supine to sitting with min A.  donns LSO with max A.  Sit to stand and SPT bed to w/c with CG/min A.  Completed grooming tasks with set up.  OH shirt with set up.  SPT to/from bedside commode with min A.  Max A for clothing management in stance.  Continent of bowel - dependent for hygiene.  SPT back to bed with CG/min A.  Sitting to supine with mod A.  Completed UB scapular exercises in semi-reclined position.   Bed alarm set and call bell in reach at close of session   PM session:   Patient in bed asleep, easily aroused and agrees to participate in therapy session.   He denies pain in supine position - pain with transitional movement subsides with brief rest.  Supine to sitting with min A.  Max A to donn LSO in sitting position.  Sit to stand and SPT bed to w/c min A.  Dependent w/c mobility to therapy gym.  SPT with RW w/c to/from mat table min A.  Tolerates UB and LB activity in unsupported sitting 25 minutes with focus on seated balance/trunk control,  hip and scapular mobility while maintaining back and sternal precautions.   Returned to room, completed SPT to/from bed side commode with min A.   He had continent bowel and bladder episode - requires max A for clothing management and hygiene in stance.  SPT w/c to bed min A.  Sitting to supine with mod A.  Bed alarm set and callbell/ltray table in reach at close of session.     Therapy Documentation Precautions:  Precautions Precautions: Fall Precaution Comments: freq cues to adhere to sternal prec Required Braces or Orthoses: Spinal Brace Spinal Brace: Lumbar corset Other Brace: for comfort Restrictions Weight Bearing Restrictions: No Other Position/Activity Restrictions: sternal precautions   Therapy/Group: Individual Therapy  Carlos Levering 05/21/2020, 7:42 AM

## 2020-05-21 NOTE — Progress Notes (Signed)
Physical Therapy Weekly Progress Note  Patient Details  Name: Johnathan Murray MRN: [id number] Date of Birth: 11-Jul-1951  Beginning of progress report period: May 15, 2020 End of progress report period: May 21, 2020  Today's Date: 05/21/2020 PT Individual Time: 0100-0155 PT Individual Time Calculation (min): 55 min   Patient has met 2 of 4 short term goals. Pt demonstrates gradual progress towards long term goals. Pt currently requires CGA/supervision to transfer supine<>sit and mod A to transfer sit<>supine for LE management. Pt able to transfer sit<>stand with RW min A from elevated surface and mod A from regular chair height and min/mod A for stand<>pivot transfers with RW. Pt currently ambulating 68f with RW mod A +2 for close WC follow and requires cues for upright posture and RW safety. Pt continues to be limited by severe low back pain (despite wearing LSO), decreased strength and endurance, and decreased ROM in bilateral knees due to fluid accumulation. Pt continues to require cues to maintain sternal precautions.   Patient continues to demonstrate the following deficits muscle weakness and muscle joint tightness and decreased standing balance, decreased postural control, decreased balance strategies and difficulty maintaining precautions and therefore will continue to benefit from skilled PT intervention to increase functional independence with mobility.  Patient progressing toward long term goals..  Continue plan of care.  PT Short Term Goals Week 1:  PT Short Term Goal 1 (Week 1): pt will perform supine to sit w/cga and adhere to precautions PT Short Term Goal 1 - Progress (Week 1): Met PT Short Term Goal 2 (Week 1): Pt will perform STS w/min assist PT Short Term Goal 2 - Progress (Week 1): Progressing toward goal PT Short Term Goal 3 (Week 1): pt will perform bed to chair w/min assist PT Short Term Goal 3 - Progress (Week 1): Met PT Short Term Goal 4 (Week 1): Pt will ambulate  525fw/LRAD and mod assist of 1 PT Short Term Goal 4 - Progress (Week 1): Progressing toward goal Week 2:  PT Short Term Goal 1 (Week 2): pt will transfer sit<>stand with LRAD min A overall PT Short Term Goal 2 (Week 2): Pt will ambulate 5048fith LRAD mod A of 1 PT Short Term Goal 3 (Week 2): Pt will perform simulated car transfer with LRAD min A  Skilled Therapeutic Interventions/Progress Updates:  Ambulation/gait training;Community reintegration;DME/adaptive equipment instruction;Neuromuscular re-education;Stair training;UE/LE Strength taining/ROM;Wheelchair propulsion/positioning;Balance/vestibular training;Discharge planning;Functional electrical stimulation;Pain management;Therapeutic Activities;UE/LE Coordination activities;Cognitive remediation/compensation;Disease management/prevention;Functional mobility training;Patient/family education;Therapeutic Exercise   Today's Interventions: Received pt supine in bed, pt agreeable to therapy, and reported pain 7/10 in low back (premedicated). Repositioning, rest breaks, and distraction done to reduce pain. Session with emphasis on functional mobility/transfers, generalized strengthening, dynamic standing balance/coordination and improved activity tolerance. Pt reminded of sternal precautions and informed that per MD report yesterday pt still has them. Pt transferred supine<>sitting EOB with HOB elevated and use of bedrails with close supervision. Pt reminded of sternal precautions but constantly states throughout session "this is the only way I can do it". Donned LSO sitting EOB total A and pt transferred stand<>pivot bed<>WC with RW from elevated bed with min A and cues for turning technique and to reach back for armrests prior to sitting. [facility] Pt requested to practice standing at table in dayroom again. Pt transferred sit<>stand min A x 2 trials with 1 UE on armrest and 1 UE on table. Worked on standing tolerance and  dynamic standing balance stacking/unstacking  cones. Pt able to remain standing for 47 seconds x 1 trial and 50 seconds x 1 trial with extensive rest break in between. Pt reported increased low back pain and politely declined practicing any more standing activities. Therapist suggested seated exercises however pt with continued back pain sitting in [facility] and requested to return to room. Pt transported back to room in [facility] total A and transferred WC<>bed stand<>pivot with RW mod A. Doffed LSO sitting EOB and pt transferred sit<>supine with mod A for LE management. Therapist positioned K pad and pt reported decreased low back pain. Pt agreeable to bed level exercises and performed the following exercises supine in bed with verbal cues for technique: -hip abduction 2x10 bilaterally -heel slides x10 bilaterally -hip adduction pillow squeezes 2x10 Concluded session with pt supine in bed, needs within reach, and bed alarm on. Therapist assisted with repositioning for comfort.   Therapy Documentation Precautions:  Precautions Precautions: Fall Precaution Comments: freq cues to adhere to sternal prec Required Braces or Orthoses: Spinal Brace Spinal Brace: Lumbar corset Other Brace: for comfort Restrictions Weight Bearing Restrictions: No Other Position/Activity Restrictions: sternal precautions  Therapy/Group: Individual Therapy Alfonse Alpers PT, DPT   05/21/2020, 7:42 AM

## 2020-05-21 NOTE — Plan of Care (Signed)
  Problem: RH Balance Goal: LTG Patient will maintain dynamic standing balance (PT) Description: LTG:  Patient will maintain dynamic standing balance with assistance during mobility activities (PT) Flowsheets (Taken 05/21/2020 0754) LTG: Pt will maintain dynamic standing balance during mobility activities with:: (downgraded due to pain, decreased strength/endurance, fluid accumulation in knees) Contact Guard/Touching assist Note: downgraded due to pain, decreased strength/endurance, fluid accumulation in knees   Problem: Sit to Stand Goal: LTG:  Patient will perform sit to stand with assistance level (PT) Description: LTG:  Patient will perform sit to stand with assistance level (PT) Flowsheets (Taken 05/21/2020 0754) LTG: PT will perform sit to stand in preparation for functional mobility with assistance level: (downgraded due to pain, decreased strength/endurance, fluid accumulation in knees) Contact Guard/Touching assist Note: downgraded due to pain, decreased strength/endurance, fluid accumulation in knees   Problem: RH Bed Mobility Goal: LTG Patient will perform bed mobility with assist (PT) Description: LTG: Patient will perform bed mobility with assistance, with/without cues (PT). Flowsheets (Taken 05/21/2020 0754) LTG: Pt will perform bed mobility with assistance level of: (downgraded due to pain, decreased core strength) Supervision/Verbal cueing Note: downgraded due to pain, decreased core strength   Problem: RH Bed to Chair Transfers Goal: LTG Patient will perform bed/chair transfers w/assist (PT) Description: LTG: Patient will perform bed to chair transfers with assistance (PT). Flowsheets (Taken 05/21/2020 0754) LTG: Pt will perform Bed to Chair Transfers with assistance level: (downgraded due to pain, decreased strength/endurance, fluid accumulation in knees) Contact Guard/Touching assist Note: downgraded due to pain, decreased strength/endurance, fluid accumulation in knees    Problem: RH Ambulation Goal: LTG Patient will ambulate in controlled environment (PT) Description: LTG: Patient will ambulate in a controlled environment, # of feet with assistance (PT). Flowsheets (Taken 05/21/2020 0754) LTG: Pt will ambulate in controlled environ  assist needed:: (downgraded due to pain, decreased strength/endurance, fluid accumulation in knees) Contact Guard/Touching assist LTG: Ambulation distance in controlled environment: 120ft Note: downgraded due to pain, decreased strength/endurance, fluid accumulation in knees Goal: LTG Patient will ambulate in home environment (PT) Description: LTG: Patient will ambulate in home environment, # of feet with assistance (PT). Flowsheets (Taken 05/21/2020 0754) LTG: Pt will ambulate in home environ  assist needed:: (downgraded due to pain, decreased strength/endurance, fluid accumulation in knees) Contact Guard/Touching assist LTG: Ambulation distance in home environment: 110ft Note: downgraded due to pain, decreased strength/endurance, fluid accumulation in knees

## 2020-05-21 NOTE — Progress Notes (Signed)
Progress Note  Patient Name: Johnathan Murray Date of Encounter: 05/21/2020  Primary Cardiologist: Jenean Lindau, MD   Subjective   Diuresed 3.6L negative overnight - BP low overnight, improved this am. Creatinine stable. Albumin not assessed today.  Inpatient Medications    Scheduled Meds: . amiodarone  200 mg Oral BID   Followed by  . [START ON 05/24/2020] amiodarone  200 mg Oral Daily  . amoxicillin  500 mg Oral Q12H  . apixaban  5 mg Oral BID  . Chlorhexidine Gluconate Cloth  6 each Topical Daily  . feeding supplement (ENSURE ENLIVE)  237 mL Oral TID BM  . feeding supplement (PRO-STAT SUGAR FREE 64)  30 mL Oral BID  . ferrous [id number] C-folic acid  1 capsule Oral Q breakfast  . ferrous sulfate  325 mg Oral BID WC  . furosemide  40 mg Intravenous BID  . insulin aspart  0-20 Units Subcutaneous TID WC  . insulin glargine  10 Units Subcutaneous BID  . levothyroxine  25 mcg Oral QAC breakfast  . lidocaine  1 patch Transdermal Q24H  . metoprolol tartrate  12.5 mg Oral BID  . oxyCODONE  10 mg Oral Q12H  . polyethylene glycol  17 g Oral Daily  . potassium chloride  20 mEq Oral BID  . pregabalin  100 mg Oral TID  . senna  1 tablet Oral QHS   Continuous Infusions:  PRN Meds: acetaminophen **OR** acetaminophen, albuterol, cyclobenzaprine, oxyCODONE, sodium chloride flush, sorbitol   Vital Signs    Vitals:   05/20/20 2116 05/20/20 2118 05/21/20 0100 05/21/20 0508  BP: 104/74  112/68 (!) 124/93  Pulse: 86 83 92 94  Resp: 16  16 18   Temp: 98.8 F (37.1 C)   97.9 F (36.6 C)  TempSrc:    Oral  SpO2: 99% 99% 99% 100%  Weight:    91 kg  Height:        Intake/Output Summary (Last 24 hours) at 05/21/2020 0933 Last data filed at 05/21/2020 0745 Gross per 24 hour  Intake 1020 ml  Output 3750 ml  Net -2730 ml   Filed Weights   05/19/20 0617 05/20/20 0459 05/21/20 0508  Weight: 92.6 kg 90.1 kg 91 kg    Physical Exam   General: Well developed, well  nourished, NAD Neck: Negative for carotid bruits. No JVD Lungs:Clear to ausculation bilaterally. No wheezes, rales, or rhonchi. Breathing is unlabored. Cardiovascular: Irregularly irregular with S1 S2.  Abdomen: Soft, non-tender, non-distended. No obvious abdominal masses. Extremities: 1+ BLE edema. Radial pulses 2+ bilaterally Neuro: Alert and oriented. No focal deficits. No facial asymmetry. MAE spontaneously. Psych: Responds to questions appropriately with normal affect.    Labs    Chemistry Recent Labs  Lab 05/18/20 1044 05/18/20 1044 05/19/20 0421 05/20/20 0450 05/21/20 0304  NA 136   < > 138 138 137  K 4.0   < > 3.8 3.7 3.6  CL 101   < > 101 100 101  CO2 26   < > 27 28 30   GLUCOSE 130*   < > 109* 153* 164*  BUN 18   < > 21 22 23   CREATININE 1.25*   < > 1.31* 1.36* 1.32*  CALCIUM 8.6*   < > 8.9 8.7* 8.7*  PROT 5.2*  --  5.5* 5.3*  --   ALBUMIN 2.0*  --  2.3* 2.4*  --   AST 24  --  24 26  --   ALT 40  --  39 37  --   ALKPHOS 94  --  91 89  --   BILITOT 1.0  --  0.8 0.8  --   GFRNONAA 59*   < > 56* 53* 55*  GFRAA >60   < > >60 >60 >60  ANIONGAP 9   < > 10 10 6    < > = values in this interval not displayed.     Hematology Recent Labs  Lab 05/18/20 1044 05/20/20 0450 05/21/20 0304  WBC 8.4 6.9 6.3  RBC 2.90* 2.93* 2.85*  HGB 7.9* 8.1* 7.9*  HCT 26.7* 27.0* 26.5*  MCV 92.1 92.2 93.0  MCH 27.2 27.6 27.7  MCHC 29.6* 30.0 29.8*  RDW 17.2* 17.2* 17.2*  PLT 349 346 321    Cardiac EnzymesNo results for input(s): TROPONINI in the last 168 hours. No results for input(s): TROPIPOC in the last 168 hours.   BNP Recent Labs  Lab 05/18/20 1044  BNP 134.7*     DDimer No results for input(s): DDIMER in the last 168 hours.   Radiology    No results found.  Telemetry    No currently on telelmetry   ECG    No new tracings os of 05/20/20- Personally Reviewed  Cardiac Studies   TEE and DCCV 04/29/20  IMPRESSIONS    1. Left ventricular ejection fraction,  by estimation, is 55 to 60%. The  left ventricle has normal function. The left ventricle has no regional  wall motion abnormalities.  2. Right ventricular systolic function is mildly reduced. The right  ventricular size is normal.  3. No left atrial/left atrial appendage thrombus was detected. The LAA  emptying velocity was 50 cm/s.  4. Multiple regurgitant jets each with mild regurgitation. The mitral  valve is normal in structure. Mild to moderate mitral valve regurgitation.  No evidence of mitral stenosis.  5. Tricuspid valve regurgitation is mild to moderate.  6. The aortic valve has been repaired/replaced. Aortic valve  regurgitation is trivial. No aortic stenosis is present. There is a 23 mm  Homograft valve/aortic root valve present in the aortic position.  Procedure Date: 04/16/2020. Echo findings are  consistent with normal structure and function of the aortic valve  prosthesis.  7. The inferior vena cava is normal in size with greater than 50%  respiratory variability, suggesting right atrial pressure of 3 mmHg.   Conclusion(s)/Recommendation(s): Normal biventricular function without  evidence of hemodynamically significant valvular heart disease.   FINDINGS  Left Ventricle: Left ventricular ejection fraction, by estimation, is 55  to 60%. The left ventricle has normal function. The left ventricle has no  regional wall motion abnormalities. The left ventricular internal cavity  size was normal in size. There is  no left ventricular hypertrophy.   Right Ventricle: The right ventricular size is normal. No increase in  right ventricular wall thickness. Right ventricular systolic function is  mildly reduced.   Left Atrium: Left atrial size was normal in size. No left atrial/left  atrial appendage thrombus was detected. The LAA emptying velocity was 50  cm/s.   Right Atrium: Right atrial size was normal in size.   Pericardium: There is no evidence of pericardial  effusion.   Mitral Valve: Multiple regurgitant jets each with mild regurgitation. The  mitral valve is normal in structure. Normal mobility of the mitral valve  leaflets. Mild to moderate mitral valve regurgitation. No evidence of  mitral valve stenosis.   Tricuspid Valve: The tricuspid valve is normal in structure. Tricuspid  valve  regurgitation is mild to moderate. No evidence of tricuspid  stenosis.   Aortic Valve: No perivalvular regurgitation or abscess. The aortic valve  has been repaired/replaced. Aortic valve regurgitation is trivial. No  aortic stenosis is present. There is a 23 mm Homograft valve/aortic root  valve present in the aortic position.  Procedure Date: 04/16/2020. Echo findings are consistent with normal  structure and function of the aortic valve prosthesis.   Pulmonic Valve: The pulmonic valve was normal in structure. Pulmonic valve  regurgitation is trivial. No evidence of pulmonic stenosis.   Aorta: The aortic root is normal in size and structure. There is minimal  (Grade I) plaque involving the descending aorta.   Venous: The inferior vena cava is normal in size with greater than 50%  respiratory variability, suggesting right atrial pressure of 3 mmHg.   IAS/Shunts: No atrial level shunt detected by color flow Doppler. There is  no evidence of a patent foramen ovale. There is no evidence of an atrial  septal defect.     MR Peak grad:  75.3 mmHg  TRICUSPID VALVE  MR Mean grad:  42.0 mmHg  TR Peak grad:  18.1 mmHg  MR Vmax:     434.00 cm/s TR Vmax:    213.00 cm/s  MR Vmean:    296.0 cm/s  MR PISA:     1.01 cm  MR PISA Eff ROA: 9 mm  MR PISA Radius: 0.40 cm  Patient Profile     69 y.o. 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 now s/p debridement of  aortic root abscess and aortic root replacement on 04/16/2020, paroxysmal atrial fibrillation on Eliquis,recurrent bilateral knee effusionsand septic arthritis with arthrocentesis (6/10),hypertension, dyslipidemia, diabetes on insulin, hypothyroidism, lumbar stenosis with neurogenic claudication, and cervical decompression in 3/2021and a fib in rehab and back to main hospital at [facility] for amiodarone and thought to repeat DCCV in future if a fib persists and infection improves (this was written 02/05/20)now with volume overload and RVR in his a fib.    Assessment & Plan    1. Persisitent AF with fluid volume overload: -HR stable -Continue amiodarone 200mg  PO QD, metoprolol 12.5mg  po BID, Eliquis  -Net negative 14L - creatinine stable -Switch to po lasix today - was on 40 mg daily at home, will switch to 40 BID starting tonight.  2. Anemia: -Follow closely given AC with Eliquis  -Hb stable  3.  AKI: -Creatinine 1.32 today -Daily CMET    4. Recent septic knee/osetomylitis L4-5: -PICC for IV abx>>stable  -Plan for team conference for goals of care and plan for home management  -Mangement per primary team   Pixie Casino, MD, [facility], [place] Director of the [facility] Lipid [facility] &  Card[facility] Diplomate of the American Board of Clinical Lipidology Attending Cardiologist  Direct Dial: [phone]  Fax: [phone]  Website:  [email or web address]   For questions or updates, please contact   Please consult [email or web address] for contact info under Cardiology/STEMI.

## 2020-05-22 ENCOUNTER — Inpatient Hospital Stay (HOSPITAL_COMMUNITY): Payer: BC Managed Care – PPO | Admitting: Occupational Therapy

## 2020-05-22 ENCOUNTER — Inpatient Hospital Stay (HOSPITAL_COMMUNITY): Payer: BC Managed Care – PPO

## 2020-05-22 LAB — BASIC METABOLIC PANEL
Anion gap: 9 (ref 5–15)
BUN: 27 mg/dL — ABNORMAL HIGH (ref 8–23)
CO2: 28 mmol/L (ref 22–32)
Calcium: 8.7 mg/dL — ABNORMAL LOW (ref 8.9–10.3)
Chloride: 100 mmol/L (ref 98–111)
Creatinine, Ser: 1.39 mg/dL — ABNORMAL HIGH (ref 0.61–1.24)
GFR calc Af Amer: 60 mL/min — ABNORMAL LOW (ref >60)
GFR calc non Af Amer: 52 mL/min — ABNORMAL LOW (ref >60)
Glucose, Bld: 193 mg/dL — ABNORMAL HIGH (ref 70–99)
Potassium: 4 mmol/L (ref 3.5–5.1)
Sodium: 137 mmol/L (ref 135–145)

## 2020-05-22 LAB — CBC WITH DIFFERENTIAL/PLATELET
Abs Immature Granulocytes: 0.06 10*3/uL (ref 0.00–0.07)
Basophils Absolute: 0.1 10*3/uL (ref 0.0–0.1)
Basophils Relative: 1 %
Eosinophils Absolute: 0.3 10*3/uL (ref 0.0–0.5)
Eosinophils Relative: 3 %
HCT: 25.5 % — ABNORMAL LOW (ref 39.0–52.0)
Hemoglobin: 7.5 g/dL — ABNORMAL LOW (ref 13.0–17.0)
Immature Granulocytes: 1 %
Lymphocytes Relative: 10 %
Lymphs Abs: 0.8 10*3/uL (ref 0.7–4.0)
MCH: 27.2 pg (ref 26.0–34.0)
MCHC: 29.4 g/dL — ABNORMAL LOW (ref 30.0–36.0)
MCV: 92.4 fL (ref 80.0–100.0)
Monocytes Absolute: 0.5 10*3/uL (ref 0.1–1.0)
Monocytes Relative: 6 %
Neutro Abs: 6.5 10*3/uL (ref 1.7–7.7)
Neutrophils Relative %: 79 %
Platelets: 316 10*3/uL (ref 150–400)
RBC: 2.76 MIL/uL — ABNORMAL LOW (ref 4.22–5.81)
RDW: 17.3 % — ABNORMAL HIGH (ref 11.5–15.5)
WBC: 8.3 10*3/uL (ref 4.0–10.5)
nRBC: 0 % (ref 0.0–0.2)

## 2020-05-22 LAB — GLUCOSE, CAPILLARY
Glucose-Capillary: 169 mg/dL — ABNORMAL HIGH (ref 70–99)
Glucose-Capillary: 105 mg/dL — ABNORMAL HIGH (ref 70–99)
Glucose-Capillary: 109 mg/dL — ABNORMAL HIGH (ref 70–99)
Glucose-Capillary: 121 mg/dL — ABNORMAL HIGH (ref 70–99)

## 2020-05-22 MED ORDER — HYDROCORTISONE 1 % EX CREA
TOPICAL_CREAM | Freq: Three times a day (TID) | CUTANEOUS | Status: DC
  Administered 2020-05-22 – 2020-05-27 (×2): 1 via TOPICAL
  Filled 2020-05-22: qty 28

## 2020-05-22 NOTE — Progress Notes (Signed)
[facility] PHYSICAL MEDICINE & REHABILITATION PROGRESS NOTE  Subjective/Complaints: Patient seen laying in bed this AM.  He states he slept well overnight.  Wife at bedside working on ROM.  He was seen by cardiology yesterday, notes reviewed-switch to p.o. Lasix.  ROS: Denies shortness of breath, CP, N/V/D  Objective: Vital Signs: Blood pressure 112/84, pulse 94, temperature 99.4 F (37.4 C), temperature source Oral, resp. rate 18, height 5\' 9"  (1.753 m), weight 90.8 kg, SpO2 97 %. No results found. Recent Labs    05/21/20 0304 05/22/20 0406  WBC 6.3 8.3  HGB 7.9* 7.5*  HCT 26.5* 25.5*  PLT 321 316   Recent Labs    05/21/20 0304 05/22/20 0406  NA 137 137  K 3.6 4.0  CL 101 100  CO2 30 28  GLUCOSE 164* 193*  BUN 23 27*  CREATININE 1.32* 1.39*  CALCIUM 8.7* 8.7*    Physical Exam: BP 112/84   Pulse 94   Temp 99.4 F (37.4 C) (Oral)   Resp 18   Ht 5\' 9"  (1.753 m)   Wt 90.8 kg   SpO2 97%   BMI 29.56 kg/m  Constitutional: No distress . Vital signs reviewed. HENT: Normocephalic.  Atraumatic. Eyes: EOMI. No discharge. Cardiovascular: No JVD. +Tachycardia. Respiratory: Normal effort.  No stridor.  Bilaterally clear to auscultation GI: Non-distended.  BS + Skin: Bilateral knees with staples C/D/I Psych: Normal mood.  Normal behavior. Musc: Bilateral knee with edema and tenderness, stable Bilateral lower extremity edema improving Neuro: Alert Motor:  Right lower extremity: Hip flexion, knee extension 3-/5, ankle dorsiflexion 4+/5 Left lower extremity: Hip flexion, knee extension 3/5, ankle dorsiflexion 4+/5  Assessment/Plan: 1. Functional deficits secondary to debility due to septic knees, disciitis which require 3+ hours per day of interdisciplinary therapy in a comprehensive inpatient rehab setting.  Physiatrist is providing close team supervision and 24 hour management of active medical problems listed below.  Physiatrist and rehab team continue to assess  barriers to discharge/monitor patient progress toward functional and medical goals  Care Tool:  Bathing  Bathing activity did not occur: Refused Body parts bathed by patient: Right arm, Left arm, Chest, Abdomen, Right upper leg, Left upper leg, Face   Body parts bathed by helper: Right lower leg, Left lower leg     Bathing assist Assist Level: Minimal Assistance - Patient > 75%     Upper Body Dressing/Undressing Upper body dressing   What is the patient wearing?: Pull over shirt, Orthosis    Upper body assist Assist Level: Moderate Assistance - Patient 50 - 74%    Lower Body Dressing/Undressing Lower body dressing      What is the patient wearing?: Pants     Lower body assist Assist for lower body dressing: Moderate Assistance - Patient 50 - 74%     Toileting Toileting Toileting Activity did not occur (Clothing management and hygiene only): N/A (no void or bm)  Toileting assist Assist for toileting: Maximal Assistance - Patient 25 - 49%     Transfers Chair/bed transfer  Transfers assist     Chair/bed transfer assist level: Moderate Assistance - Patient 50 - 74%     Locomotion Ambulation   Ambulation assist      Assist level: 2 helpers Assistive device: Walker-rolling Max distance: 76ft   Walk 10 feet activity   Assist     Assist level: 2 helpers Assistive device: Walker-rolling   Walk 50 feet activity   Assist Walk 50 feet with 2 turns activity  did not occur: Safety/medical concerns         Walk 150 feet activity   Assist Walk 150 feet activity did not occur: Safety/medical concerns         Walk 10 feet on uneven surface  activity   Assist Walk 10 feet on uneven surfaces activity did not occur: Safety/medical concerns         Wheelchair     Assist Will patient use wheelchair at discharge?: No             Wheelchair 50 feet with 2 turns activity    Assist            Wheelchair 150 feet activity      Assist            Medical Problem List and Plan: 1.  Decreased functional mobility secondary to sepsis/Enterococcus faecalis bacteremia/discitis/osteomyelitis of L4-5 status post aortic root replacement 04/17/2020 status post cardioversion for atrial fibrillation.  Sternal precautions/lumbar corset for comfort x 4 more weeks  Continue CIR 2.  Antithrombotics: -DVT/anticoagulation: Resumed Eliquis 05/15/2020             -antiplatelet therapy: N/A 3. Pain Management: OxyContin sustained release 10 mg every 12 hours, Lyrica 100 mg 3 times daily Flexeril as as well as oxycodone needed  Relatively controlled on 7/2  Lidoderm patch ordered on 6/30 4. Mood: N/A meds- follow for ego support             -antipsychotic agents: N/A 5. Neuropsych: This patient is capable of making decisions on his own behalf. 6. Skin/Wound Care: Routine skin checks 7. Fluids/Electrolytes/Nutrition: Routine in and outs. 8.  ID/Enterococcus bacteremia.  Continue IV ampicillin 2 g every 4 hours as well as Rocephin 2 g every 12 hours completed on 6/30.  Follow-up infectious disease 9.  Acute on chronic anemia.  Continue iron supplement.    Hb 7.5 on 7/2, labs ordered for Monday  Cont to monitor 10.  Bilateral septic knees.  Status post total synovectomy irrigation debridement bilateral knees 05/02/2020 per Dr. Sharol Given.  Weightbearing as tolerated.   Continue ice to bilateral knees  11.  AKI.     Creatinine 1.39 on 7/2  Secondary to diuresis 12.  PAF.  Follow-up cardiology services.  Amiodarone as directed.    Continue Lopressor 12.5 mg twice daily.    Monitor with increased exertion  Repeat ECG reviewed, showing a flutter with prolonged QTC  Tachycardia, but appears to be sinus tach on 7/2 13.  Diabetes mellitus with hyperglycemia.  Latest hemoglobin A1c 6.8.  Lantus insulin 10 units twice daily.   CBG (last 3)  Recent Labs    05/21/20 1646 05/21/20 2125 05/22/20 0539  GLUCAP 74 189* 169*    Labile on  7/2, monitor for trend  Monitor with increased mobility 14.  Recent C3-4 ACDF 03/02/2020 per [name] for cervical myelopathy.  Cervical collar discontinued 15.  Hypothyroidism.  Synthroid 16. Drug-induced constipation.  MiraLAX daily, Senokot nightly  Adjust bowel meds as necessary 17.  Severe hypoalbuminemia  Supplement initiated on 6/25  IV supplementation x3 18.  Transaminitis: Resolved  LFTs within normal limits on 6/29 19.  Peripheral edema   See #12, #19  IV Lasix and IV albumin per cardiology, additional dose given on 6/29, transitioned to p.o. Lasix on 6/30  Echo reviewed from 6/9-showing normal EF  BNP elevated  Filed Weights   05/20/20 0459 05/21/20 0508 05/22/20 0532  Weight: 90.1 kg 91 kg 90.8 kg  Stable on 7/2 20.  Prolonged QTC  Monitor in conjunction with other medications  Appreciate cards recs 21.  Hypothyroidism  TSH elevated on 6/29 22.  Borderline soft blood pressures  See #12, will consider changing medications if symptomatic 23.  Hypokalemia-secondary to diuretics  Potassium 4.0 on 7/2  Supplement increased on 7/1  LOS: 8 days A FACE TO FACE EVALUATION WAS PERFORMED  Kalan Rinn Lorie Phenix 05/22/2020, 8:58 AM

## 2020-05-22 NOTE — Progress Notes (Signed)
Progress Note  Patient Name: Johnathan Murray Date of Encounter: 05/22/2020  Primary Cardiologist: Jenean Lindau, MD   Subjective   Diuresed 2L negative overnight - BP up and down. Creatinine up to 1.39 (but was 1.37 2 days ago).  Inpatient Medications    Scheduled Meds: . amiodarone  200 mg Oral BID   Followed by  . [START ON 05/24/2020] amiodarone  200 mg Oral Daily  . amoxicillin  500 mg Oral Q12H  . apixaban  5 mg Oral BID  . Chlorhexidine Gluconate Cloth  6 each Topical Daily  . feeding supplement (ENSURE ENLIVE)  237 mL Oral TID BM  . feeding supplement (PRO-STAT SUGAR FREE 64)  30 mL Oral BID  . ferrous [id number] C-folic acid  1 capsule Oral Q breakfast  . ferrous sulfate  325 mg Oral BID WC  . furosemide  40 mg Oral BID  . insulin aspart  0-20 Units Subcutaneous TID WC  . insulin glargine  10 Units Subcutaneous BID  . levothyroxine  25 mcg Oral QAC breakfast  . lidocaine  1 patch Transdermal Q24H  . metoprolol tartrate  12.5 mg Oral BID  . oxyCODONE  10 mg Oral Q12H  . polyethylene glycol  17 g Oral Daily  . potassium chloride  20 mEq Oral BID  . pregabalin  100 mg Oral TID  . senna  1 tablet Oral QHS   Continuous Infusions:  PRN Meds: acetaminophen **OR** acetaminophen, albuterol, cyclobenzaprine, oxyCODONE, sodium chloride flush, sorbitol   Vital Signs    Vitals:   05/21/20 1425 05/21/20 2040 05/22/20 0532 05/22/20 0552  BP: 96/72 90/67 98/69  112/84  Pulse: 89 78 94   Resp: 18 18 18    Temp: 98.2 F (36.8 C) 99.3 F (37.4 C) 99.4 F (37.4 C)   TempSrc: Oral Oral Oral   SpO2: 95% 100% 97%   Weight:   90.8 kg   Height:        Intake/Output Summary (Last 24 hours) at 05/22/2020 0906 Last data filed at 05/22/2020 0753 Gross per 24 hour  Intake 840 ml  Output 2750 ml  Net -1910 ml   Filed Weights   05/20/20 0459 05/21/20 0508 05/22/20 0532  Weight: 90.1 kg 91 kg 90.8 kg    Physical Exam   General: Well developed, well  nourished, NAD Neck: Negative for carotid bruits. No JVD Lungs:Clear to ausculation bilaterally. No wheezes, rales, or rhonchi. Breathing is unlabored. Cardiovascular: Irregularly irregular with S1 S2.  Abdomen: Soft, non-tender, non-distended. No obvious abdominal masses. Extremities: trace to 1+ BLE edema. Bilateral knee swelling post-op with staples in place. Radial pulses 2+ bilaterally Neuro: Alert and oriented. No focal deficits. No facial asymmetry. MAE spontaneously. Psych: Responds to questions appropriately with normal affect.    Labs    Chemistry Recent Labs  Lab 05/18/20 1044 05/18/20 1044 05/19/20 0421 05/19/20 0421 05/20/20 0450 05/21/20 0304 05/22/20 0406  NA 136   < > 138   < > 138 137 137  K 4.0   < > 3.8   < > 3.7 3.6 4.0  CL 101   < > 101   < > 100 101 100  CO2 26   < > 27   < > 28 30 28   GLUCOSE 130*   < > 109*   < > 153* 164* 193*  BUN 18   < > 21   < > 22 23 27*  CREATININE 1.25*   < > 1.31*   < >  1.36* 1.32* 1.39*  CALCIUM 8.6*   < > 8.9   < > 8.7* 8.7* 8.7*  PROT 5.2*  --  5.5*  --  5.3*  --   --   ALBUMIN 2.0*  --  2.3*  --  2.4*  --   --   AST 24  --  24  --  26  --   --   ALT 40  --  39  --  37  --   --   ALKPHOS 94  --  91  --  89  --   --   BILITOT 1.0  --  0.8  --  0.8  --   --   GFRNONAA 59*   < > 56*   < > 53* 55* 52*  GFRAA >60   < > >60   < > >60 >60 60*  ANIONGAP 9   < > 10   < > 10 6 9    < > = values in this interval not displayed.     Hematology Recent Labs  Lab 05/20/20 0450 05/21/20 0304 05/22/20 0406  WBC 6.9 6.3 8.3  RBC 2.93* 2.85* 2.76*  HGB 8.1* 7.9* 7.5*  HCT 27.0* 26.5* 25.5*  MCV 92.2 93.0 92.4  MCH 27.6 27.7 27.2  MCHC 30.0 29.8* 29.4*  RDW 17.2* 17.2* 17.3*  PLT 346 321 316    Cardiac EnzymesNo results for input(s): TROPONINI in the last 168 hours. No results for input(s): TROPIPOC in the last 168 hours.   BNP Recent Labs  Lab 05/18/20 1044  BNP 134.7*     DDimer No results for input(s): DDIMER in the  last 168 hours.   Radiology    No results found.  Telemetry    No currently on telelmetry   ECG    No new tracings os of 05/20/20- Personally Reviewed  Cardiac Studies   TEE and DCCV 04/29/20  IMPRESSIONS    1. Left ventricular ejection fraction, by estimation, is 55 to 60%. The  left ventricle has normal function. The left ventricle has no regional  wall motion abnormalities.  2. Right ventricular systolic function is mildly reduced. The right  ventricular size is normal.  3. No left atrial/left atrial appendage thrombus was detected. The LAA  emptying velocity was 50 cm/s.  4. Multiple regurgitant jets each with mild regurgitation. The mitral  valve is normal in structure. Mild to moderate mitral valve regurgitation.  No evidence of mitral stenosis.  5. Tricuspid valve regurgitation is mild to moderate.  6. The aortic valve has been repaired/replaced. Aortic valve  regurgitation is trivial. No aortic stenosis is present. There is a 23 mm  Homograft valve/aortic root valve present in the aortic position.  Procedure Date: 04/16/2020. Echo findings are  consistent with normal structure and function of the aortic valve  prosthesis.  7. The inferior vena cava is normal in size with greater than 50%  respiratory variability, suggesting right atrial pressure of 3 mmHg.   Conclusion(s)/Recommendation(s): Normal biventricular function without  evidence of hemodynamically significant valvular heart disease.   FINDINGS  Left Ventricle: Left ventricular ejection fraction, by estimation, is 55  to 60%. The left ventricle has normal function. The left ventricle has no  regional wall motion abnormalities. The left ventricular internal cavity  size was normal in size. There is  no left ventricular hypertrophy.   Right Ventricle: The right ventricular size is normal. No increase in  right ventricular wall thickness. Right ventricular systolic function  is  mildly reduced.    Left Atrium: Left atrial size was normal in size. No left atrial/left  atrial appendage thrombus was detected. The LAA emptying velocity was 50  cm/s.   Right Atrium: Right atrial size was normal in size.   Pericardium: There is no evidence of pericardial effusion.   Mitral Valve: Multiple regurgitant jets each with mild regurgitation. The  mitral valve is normal in structure. Normal mobility of the mitral valve  leaflets. Mild to moderate mitral valve regurgitation. No evidence of  mitral valve stenosis.   Tricuspid Valve: The tricuspid valve is normal in structure. Tricuspid  valve regurgitation is mild to moderate. No evidence of tricuspid  stenosis.   Aortic Valve: No perivalvular regurgitation or abscess. The aortic valve  has been repaired/replaced. Aortic valve regurgitation is trivial. No  aortic stenosis is present. There is a 23 mm Homograft valve/aortic root  valve present in the aortic position.  Procedure Date: 04/16/2020. Echo findings are consistent with normal  structure and function of the aortic valve prosthesis.   Pulmonic Valve: The pulmonic valve was normal in structure. Pulmonic valve  regurgitation is trivial. No evidence of pulmonic stenosis.   Aorta: The aortic root is normal in size and structure. There is minimal  (Grade I) plaque involving the descending aorta.   Venous: The inferior vena cava is normal in size with greater than 50%  respiratory variability, suggesting right atrial pressure of 3 mmHg.   IAS/Shunts: No atrial level shunt detected by color flow Doppler. There is  no evidence of a patent foramen ovale. There is no evidence of an atrial  septal defect.     MR Peak grad:  75.3 mmHg  TRICUSPID VALVE  MR Mean grad:  42.0 mmHg  TR Peak grad:  18.1 mmHg  MR Vmax:     434.00 cm/s TR Vmax:    213.00 cm/s  MR Vmean:    296.0 cm/s  MR PISA:     1.01 cm  MR PISA Eff ROA: 9 mm  MR PISA Radius: 0.40 cm  Patient  Profile     69 y.o. 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 now s/p debridement of aortic root abscess and aortic root replacement on 04/16/2020, paroxysmal atrial fibrillation on Eliquis,recurrent bilateral knee effusionsand septic arthritis with arthrocentesis (6/10),hypertension, dyslipidemia, diabetes on insulin, hypothyroidism, lumbar stenosis with neurogenic claudication, and cervical decompression in 3/2021and a fib in rehab and back to main hospital at [facility] for amiodarone and thought to repeat DCCV in future if a fib persists and infection improves (this was written 02/05/20)now with volume overload and RVR in his a fib.    Assessment & Plan    1. Persisitent AF with fluid volume overload: -HR stable -Continue amiodarone 200mg  PO QD, metoprolol 12.5mg  po BID, Eliquis  -Net negative 16L - creatinine stable -Continue lasix 40 mg BID  2. Anemia: -Follow closely given AC with Eliquis  -Hb stable  3.  AKI: -Creatinine 1.32 today -Daily CMET    4. Recent septic knee/osetomylitis L4-5: -PICC for IV abx>>stable  -Plan for team conference for goals of care and plan for home management  -Mangement per primary team   Pixie Casino, MD, [place] Director of the [facility] Diplomate of the American Board of Clinical Lipidology Attending Cardiologist  Direct Dial: [phone]  Fax: [phone]  Website:  [email or web address]   For questions or updates, please contact   Please consult [email or web address] for contact info under Cardiology/STEMI.

## 2020-05-22 NOTE — Progress Notes (Signed)
Occupational Therapy Weekly Progress Note  Patient Details  Name: Johnathan Murray MRN: [id number] Date of Birth: 1951/06/08  Beginning of progress report period: May 15, 2020 End of progress report period: May 22, 2020  Today's Date: 05/22/2020 OT Individual Time: 6962-9528 OT Individual Time Calculation (min): 57 min    Patient has met 4 of 4 short term goals.  Pt is making slow progress towards goals.  Pt is limiting by decreased ROM in B knees as well as onset of low back pain with sit > stand and transitional movements.  Pt completes bed mobility with supervision with increased time due to guarding of low back.  Pt completes sit > stand from elevated surfaces with min assist and up to mod assist from standard height w/c or BSC due to BLE weakness and sternal precautions.  Pt currently completes LB dressing at bed level with mod assist with use of reacher.  Pt is able to complete toilet transfers with min-mod assist, but requires max-total for toileting tasks.  Pt is very motivated to progress and d/c home.  Patient continues to demonstrate the following deficits: muscle weakness and decreased sitting balance, decreased standing balance, decreased postural control and difficulty maintaining precautions and therefore will continue to benefit from skilled OT intervention to enhance overall performance with BADL and Reduce care partner burden.  Patient progressing toward long term goals..  Continue plan of care.  OT Short Term Goals Week 1:  OT Short Term Goal 1 (Week 1): Pt will don pants with modA and LRAD PRN. OT Short Term Goal 1 - Progress (Week 1): Met OT Short Term Goal 2 (Week 1): Pt will complete toilet transfer to [facility] with modA. OT Short Term Goal 2 - Progress (Week 1): Met OT Short Term Goal 3 (Week 1): Pt will adhere to sternal precautions with min verbal cues during ADL task. OT Short Term Goal 3 - Progress (Week 1): Met OT Short Term Goal 4 (Week 1): Pt will complete  bathing at sit > stand level with mod assist of one caregiver OT Short Term Goal 4 - Progress (Week 1): Met Week 2:  OT Short Term Goal 1 (Week 2): Pt will complete toileting tasks with mod assist at sit > stand level OT Short Term Goal 2 (Week 2): Pt will complete LB dressing at sit > stand level with mod assist OT Short Term Goal 3 (Week 2): Pt will complete bathing at shower level with min assist  Skilled Therapeutic Interventions/Progress Updates:    Treatment session with focus on LB dressing, functional transfers, and activity tolerance.  Pt received supine in bed declining shower this AM, but reporting agreeable to shower during PM session.  Pt donned pants at bed level with use of reacher and assist to thread RLE - educated on dressing weaker leg first to increase success with Lb dressing.  Required min assist to adjust pants over hips while rolling side to side.  Therapist donned TEDS and socks total assist.  Pt completed bed mobility with supervision and increased time to not set off pain in lower back.  Donned LSO with max assist while pt seated EOB.  Pt completed sit > stand from elevated EOB with mod assist (as bed not as elevated as previous sessions).  Completed stand pivot transfer with RW min assist to w/c.  Pt engaged in BLE strengthening and activity tolerance on Kinetron with focus on increased knee flexion and endurance as needed for self-care tasks, transfers, and increased  endurance for functional tasks.  Pt reporting some difficulty catching his breath, but all vitals WNL.  Pt returned to room and transferred back to bed Mod assist sit > stand from w/c and min assist stand pivot to EOB.  Therapist assisted with bringing BLE in to bed while pt controlled upper body and trunk to manage back pain.  Pt remained semi-reclined in bed with all needs in reach and wife present.  Therapy Documentation Precautions:  Precautions Precautions: Fall Precaution Comments: freq cues to adhere to  sternal prec Required Braces or Orthoses: Spinal Brace Spinal Brace: Lumbar corset Other Brace: for comfort Restrictions Weight Bearing Restrictions: No Other Position/Activity Restrictions: sternal precautions General:   Vital Signs: Therapy Vitals Temp: 99.4 F (37.4 C) Temp Source: Oral Pulse Rate: 94 Resp: 18 BP: 112/84 Patient Position (if appropriate): Lying Oxygen Therapy SpO2: 97 % O2 Device: Room Air Pain: Pain Assessment Pain Scale: 0-10 Pain Score: 6  Pain Type: Chronic pain Pain Location: Back Pain Orientation: Lower Pain Descriptors / Indicators: Aching Pain Frequency: Constant Pain Onset: On-going Patients Stated Pain Goal: 2 Pain Intervention(s): Repositioned   Therapy/Group: Individual Therapy  Simonne Come 05/22/2020, 7:44 AM

## 2020-05-22 NOTE — Progress Notes (Signed)
   05/22/20 1513  Assess: MEWS Score  Temp 98.7 F (37.1 C)  BP 93/73  Pulse Rate (!) 135  Resp 19  SpO2 97 %  O2 Device Room Air  Assess: MEWS Score  MEWS Temp 0  MEWS Systolic 1  MEWS Pulse 3  MEWS RR 0  MEWS LOC 0  MEWS Score 4  MEWS Score Color Red   LPN reassessed, NT in training took vitals right after completion of therapy session and did not notify nurse. Nurse has re checked for correct vitals and came up as green.

## 2020-05-22 NOTE — Progress Notes (Signed)
Occupational Therapy Session Note  Patient Details  Name: Johnathan Murray MRN: [id number] Date of Birth: 08-02-1951  Today's Date: 05/22/2020 OT Individual Time: 1303-1350 OT Individual Time Calculation (min): 47 min    Short Term Goals: Week 2:  OT Short Term Goal 1 (Week 2): Pt will complete toileting tasks with mod assist at sit > stand level OT Short Term Goal 2 (Week 2): Pt will complete LB dressing at sit > stand level with mod assist OT Short Term Goal 3 (Week 2): Pt will complete bathing at shower level with min assist  Skilled Therapeutic Interventions/Progress Updates:    Treatment session with focus on ADL retraining with bathing at shower level, functional transfers, and LB dressing.  Pt received supine in bed agreeable to shower.  Therapist covered IV site and B knee incisions prior to shower.  Completed bed mobility with supervision and sit > stand min assist with RW and min assist stand pivot to roll in shower chair.  Pt completed bathing at seated level from roll-in shower chair to provide increased back support and minimize need for transfers.  Required assistance to wash buttocks and lower legs due to decreased ability to reach.  Completed LB dressing with assist to thread BLE due to fatigue and mod assist sit > stand from shower chair with RW.  Pt's wife assisted with pulling pants over hips due to pt's modesty and not wanting therapist to assist.  Completed stand pivot transfer back to bed Min assist with RW and mod assist to lift BLE in to bed.  Pt remained semi-reclined in bed with wife present and all needs in reach.  Therapy Documentation Precautions:  Precautions Precautions: Fall Precaution Comments: freq cues to adhere to sternal prec Required Braces or Orthoses: Spinal Brace Spinal Brace: Lumbar corset Other Brace: for comfort Restrictions Weight Bearing Restrictions: No Other Position/Activity Restrictions: sternal precautions Pain:  Pt with c/o pain 6/10  in lower back.  Premedicated       Therapy/Group: Individual Therapy  Simonne Come 05/22/2020, 3:06 PM

## 2020-05-22 NOTE — Plan of Care (Signed)
  Problem: Consults Goal: RH GENERAL PATIENT EDUCATION Description: See Patient Education module for education specifics. Outcome: Progressing Goal: Skin Care Protocol Initiated - if Braden Score 18 or less Description: If consults are not indicated, leave blank or document N/A Outcome: Progressing   Problem: RH BOWEL ELIMINATION Goal: RH STG MANAGE BOWEL WITH ASSISTANCE Description: STG Manage Bowel with mod I Assistance. Outcome: Progressing Goal: RH STG MANAGE BOWEL W/MEDICATION W/ASSISTANCE Description: STG Manage Bowel with Medication with mod I Assistance. Outcome: Progressing   Problem: RH SKIN INTEGRITY Goal: RH STG MAINTAIN SKIN INTEGRITY WITH ASSISTANCE Description: STG Maintain Skin Integrity With min Assistance. Outcome: Progressing Goal: RH STG ABLE TO PERFORM INCISION/WOUND CARE W/ASSISTANCE Description: STG Able To Perform Incision/Wound Care With min Assistance. Outcome: Progressing   Problem: RH SAFETY Goal: RH STG ADHERE TO SAFETY PRECAUTIONS W/ASSISTANCE/DEVICE Description: STG Adhere to Safety Precautions With cues/reminders Assistance/Device. Outcome: Progressing   Problem: RH PAIN MANAGEMENT Goal: RH STG PAIN MANAGED AT OR BELOW PT'S PAIN GOAL Description: Less than 4  Outcome: Progressing   Problem: Consults Goal: RH GENERAL PATIENT EDUCATION Description: See Patient Education module for education specifics. Outcome: Progressing

## 2020-05-22 NOTE — Progress Notes (Signed)
Physical Therapy Session Note  Patient Details  Name: Johnathan Murray MRN: [id number] Date of Birth: 08/19/1951  Today's Date: 05/22/2020 PT Individual Time: 5978-9645 and 2739-9591 PT Individual Time Calculation (min): 57 min and 27 min   Short Term Goals: Week 1:  PT Short Term Goal 1 (Week 1): pt will perform supine to sit w/cga and adhere to precautions PT Short Term Goal 1 - Progress (Week 1): Met PT Short Term Goal 2 (Week 1): Pt will perform STS w/min assist PT Short Term Goal 2 - Progress (Week 1): Progressing toward goal PT Short Term Goal 3 (Week 1): pt will perform bed to chair w/min assist PT Short Term Goal 3 - Progress (Week 1): Met PT Short Term Goal 4 (Week 1): Pt will ambulate 8ft w/LRAD and mod assist of 1 PT Short Term Goal 4 - Progress (Week 1): Progressing toward goal Week 2:  PT Short Term Goal 1 (Week 2): pt will transfer sit<>stand with LRAD min A overall PT Short Term Goal 2 (Week 2): Pt will ambulate 77ft with LRAD mod A of 1 PT Short Term Goal 3 (Week 2): Pt will perform simulated car transfer with LRAD min A  Skilled Therapeutic Interventions/Progress Updates:   Treatment Session 1: 1030-1127 57 min Received pt supine in bed, pt agreeable to therapy, and reported pain 4/10 in low back (premedicated). Repositioning, rest breaks, and distraction done to reduce pain levels. Session with emphasis on functional mobility/transfers, generalized strengthening, dynamic standing balance/coordinaiton, ambulation, and improved activity tolerance. Pt transferred supine<>sitting EOB with HOB elevated and supervision. Donned LSO sitting EOB with total A. Pt transferred stand<>pivot bed<>WC with RW min A from elevated bed with cues to maintain sternal precautions. Pt attempted WC mobility using bilateral LEs but only able to propel WC 19ft due to decreased bilateral knee ROM and increase in low back pain. Pt transported to therapy gym in [facility] total A and ambulated 71ft with RW min  A +2 for close WC follow. Pt required cues for upright posture and RW safety as pt with tendency to demonstrate flexed trunk due to low back pain. Pt transported to dayroom and transferred stand<>pivot WC<>Nustep with RW mod A. Pt performed bilateral LE strengthening on Nustep at workload 1 for 8 minutes with emphasis on LE strengthening, bilateral knee ROM, and cardiovascular endurance. Pt required assist from therapist to reposition LE on footplate's throughout activity. Pt required multiple rest breaks throughout session due to increased back pain and fatigue. Pt transported back to room in [facility] total A and transferred WC<>bed stand<>pivot with RW min A, dofed LSO total A, and transferred sit<>supine with mod A for LE management. Concluded session with pt supine in bed, needs within reach, and bed alarm on. Therapist provided drink for pt.   Treatment Session 2: 1415-1442 27 min Received pt supine in bed, pt agreeable to therapy, and reported pain 4/10 in low back. RN notified and administered pain medication during session. Repositioning, rest breaks, and distraction done to reduce pain levels. Session with emphasis on functional mobility/transfers, generalized strengthening, dynamic standing balance/coordinaiton, and improved activity tolerance. Pt requested to use urinal prior to session and able to urinate in supine with urinal with supervision. Pt transferred supine<>sitting EOB with HOB elevated and supervision, donned LOS total A, and transferred stand<>pivot bed<>WC with RW and min A from elevated bed and transported to dayroom in [facility] total A for time management purposes. Worked on dynamic sitting balance and trunk control playing cornhole with supervision  with emphasis on reaching outside BOS x 3 trials. Pt reported increased low back pain with activity and required multiple rest breaks. Pt transported back to room in [facility] total A and transferred stand<>pivot WC<>bed with RW min A. Doffed LSO total A and  transferred sit<>supine mod A for LE management. Concluded session with pt supine in bed, needs within reach, and no bed alarm on but pt's wife present at bedside.   Therapy Documentation Precautions:  Precautions Precautions: Fall Precaution Comments: freq cues to adhere to sternal prec Required Braces or Orthoses: Spinal Brace Spinal Brace: Lumbar corset Other Brace: for comfort Restrictions Weight Bearing Restrictions: No Other Position/Activity Restrictions: sternal precautions  Therapy/Group: Individual Therapy Alfonse Alpers PT, DPT   05/22/2020, 7:37 AM

## 2020-05-23 ENCOUNTER — Inpatient Hospital Stay (HOSPITAL_COMMUNITY): Payer: BC Managed Care – PPO | Admitting: Physical Therapy

## 2020-05-23 DIAGNOSIS — I4819 Other persistent atrial fibrillation: Secondary | ICD-10-CM

## 2020-05-23 LAB — CBC WITH DIFFERENTIAL/PLATELET
Abs Immature Granulocytes: 0.03 10*3/uL (ref 0.00–0.07)
Basophils Absolute: 0.1 10*3/uL (ref 0.0–0.1)
Basophils Relative: 1 %
Eosinophils Absolute: 0.3 10*3/uL (ref 0.0–0.5)
Eosinophils Relative: 5 %
HCT: 26.4 % — ABNORMAL LOW (ref 39.0–52.0)
Hemoglobin: 7.7 g/dL — ABNORMAL LOW (ref 13.0–17.0)
Immature Granulocytes: 0 %
Lymphocytes Relative: 13 %
Lymphs Abs: 0.9 10*3/uL (ref 0.7–4.0)
MCH: 26.8 pg (ref 26.0–34.0)
MCHC: 29.2 g/dL — ABNORMAL LOW (ref 30.0–36.0)
MCV: 92 fL (ref 80.0–100.0)
Monocytes Absolute: 0.5 10*3/uL (ref 0.1–1.0)
Monocytes Relative: 8 %
Neutro Abs: 5.2 10*3/uL (ref 1.7–7.7)
Neutrophils Relative %: 73 %
Platelets: 307 10*3/uL (ref 150–400)
RBC: 2.87 MIL/uL — ABNORMAL LOW (ref 4.22–5.81)
RDW: 17.2 % — ABNORMAL HIGH (ref 11.5–15.5)
WBC: 7.1 10*3/uL (ref 4.0–10.5)
nRBC: 0 % (ref 0.0–0.2)

## 2020-05-23 LAB — BASIC METABOLIC PANEL
Anion gap: 10 (ref 5–15)
BUN: 28 mg/dL — ABNORMAL HIGH (ref 8–23)
CO2: 26 mmol/L (ref 22–32)
Calcium: 9 mg/dL (ref 8.9–10.3)
Chloride: 101 mmol/L (ref 98–111)
Creatinine, Ser: 1.34 mg/dL — ABNORMAL HIGH (ref 0.61–1.24)
GFR calc Af Amer: >60 mL/min (ref >60)
GFR calc non Af Amer: 54 mL/min — ABNORMAL LOW (ref >60)
Glucose, Bld: 114 mg/dL — ABNORMAL HIGH (ref 70–99)
Potassium: 4.3 mmol/L (ref 3.5–5.1)
Sodium: 137 mmol/L (ref 135–145)

## 2020-05-23 LAB — GLUCOSE, CAPILLARY
Glucose-Capillary: 111 mg/dL — ABNORMAL HIGH (ref 70–99)
Glucose-Capillary: 146 mg/dL — ABNORMAL HIGH (ref 70–99)
Glucose-Capillary: 106 mg/dL — ABNORMAL HIGH (ref 70–99)
Glucose-Capillary: 140 mg/dL — ABNORMAL HIGH (ref 70–99)

## 2020-05-23 NOTE — Plan of Care (Addendum)
  Problem: RH SAFETY Goal: RH STG ADHERE TO SAFETY PRECAUTIONS W/ASSISTANCE/DEVICE Description: STG Adhere to Safety Precautions With cues/reminders Assistance/Device. Outcome: Progressing; patient requesting  if he can go out on hallway or outside per wheelchair with wife ; claims he's been here for 9 weeks

## 2020-05-23 NOTE — Progress Notes (Signed)
[place] PHYSICAL MEDICINE & REHABILITATION PROGRESS NOTE  Subjective/Complaints: Appreciate cardiology note Pain well controlled except during some movement  Wife at bedside requesting grounds pass  ROS: Denies shortness of breath, CP, N/V/D  Objective: Vital Signs: Blood pressure 109/78, pulse 80, temperature 98.5 F (36.9 C), temperature source Oral, resp. rate 18, height 5\' 9"  (1.753 m), weight 87.9 kg, SpO2 98 %. No results found. Recent Labs    05/22/20 0406 05/23/20 0538  WBC 8.3 7.1  HGB 7.5* 7.7*  HCT 25.5* 26.4*  PLT 316 307   Recent Labs    05/22/20 0406 05/23/20 0538  NA 137 137  K 4.0 4.3  CL 100 101  CO2 28 26  GLUCOSE 193* 114*  BUN 27* 28*  CREATININE 1.39* 1.34*  CALCIUM 8.7* 9.0    Physical Exam: BP 109/78 (BP Location: Left Arm)   Pulse 80   Temp 98.5 F (36.9 C) (Oral)   Resp 18   Ht 5\' 9"  (1.753 m)   Wt 87.9 kg   SpO2 98%   BMI 28.62 kg/m  Constitutional: No distress . Vital signs reviewed. HENT: Normocephalic.  Atraumatic. Eyes: EOMI. No discharge. Cardiovascular: No JVD. +Tachycardia. Respiratory: Normal effort.  No stridor.  Bilaterally clear to auscultation GI: Non-distended.  BS + Skin: Bilateral knees with staples C/D/I Psych: Normal mood.  Normal behavior. Musc: Bilateral knee with edema and tenderness, stable Bilateral lower extremity edema improving Neuro: Alert Motor:  Right lower extremity: Hip flexion, knee extension 3-/5, ankle dorsiflexion 4+/5 Left lower extremity: Hip flexion, knee extension 3/5, ankle dorsiflexion 4+/5  Assessment/Plan: 1. Functional deficits secondary to debility due to septic knees, disciitis which require 3+ hours per day of interdisciplinary therapy in a comprehensive inpatient rehab setting.  Physiatrist is providing close team supervision and 24 hour management of active medical problems listed below.  Physiatrist and rehab team continue to assess barriers to discharge/monitor patient  progress toward functional and medical goals  Care Tool:  Bathing  Bathing activity did not occur: Refused Body parts bathed by patient: Right arm, Left arm, Chest, Abdomen, Right upper leg, Left upper leg, Face, Front perineal area   Body parts bathed by helper: Right lower leg, Left lower leg     Bathing assist Assist Level: Minimal Assistance - Patient > 75%     Upper Body Dressing/Undressing Upper body dressing   What is the patient wearing?: Pull over shirt, Orthosis    Upper body assist Assist Level: Minimal Assistance - Patient > 75%    Lower Body Dressing/Undressing Lower body dressing      What is the patient wearing?: Pants     Lower body assist Assist for lower body dressing: Moderate Assistance - Patient 50 - 74%     Toileting Toileting Toileting Activity did not occur (Clothing management and hygiene only): N/A (no void or bm)  Toileting assist Assist for toileting: Maximal Assistance - Patient 25 - 49%     Transfers Chair/bed transfer  Transfers assist     Chair/bed transfer assist level: Moderate Assistance - Patient 50 - 74%     Locomotion Ambulation   Ambulation assist      Assist level: 2 helpers Assistive device: Walker-rolling Max distance: 19ft   Walk 10 feet activity   Assist     Assist level: 2 helpers Assistive device: Walker-rolling   Walk 50 feet activity   Assist Walk 50 feet with 2 turns activity did not occur: Safety/medical concerns  Walk 150 feet activity   Assist Walk 150 feet activity did not occur: Safety/medical concerns         Walk 10 feet on uneven surface  activity   Assist Walk 10 feet on uneven surfaces activity did not occur: Safety/medical concerns         Wheelchair     Assist Will patient use wheelchair at discharge?: Yes Type of Wheelchair: Manual    Wheelchair assist level: Supervision/Verbal cueing Max wheelchair distance: 24ft    Wheelchair 50 feet with 2  turns activity    Assist            Wheelchair 150 feet activity     Assist            Medical Problem List and Plan: 1.  Decreased functional mobility secondary to sepsis/Enterococcus faecalis bacteremia/discitis/osteomyelitis of L4-5 status post aortic root replacement 04/17/2020 status post cardioversion for atrial fibrillation.  Sternal precautions/lumbar corset for comfort x 4 more weeks  Continue CIR PT, OT OK for grounds pass 2.  Antithrombotics: -DVT/anticoagulation: Resumed Eliquis 05/15/2020             -antiplatelet therapy: N/A 3. Pain Management: OxyContin sustained release 10 mg every 12 hours, Lyrica 100 mg 3 times daily Flexeril as as well as oxycodone needed  Relatively controlled on 7/2  Lidoderm patch ordered on 6/30 4. Mood: N/A meds- follow for ego support             -antipsychotic agents: N/A 5. Neuropsych: This patient is capable of making decisions on his own behalf. 6. Skin/Wound Care: Routine skin checks 7. Fluids/Electrolytes/Nutrition: Routine in and outs. 8.  ID/Enterococcus bacteremia.  Continue IV ampicillin 2 g every 4 hours as well as Rocephin 2 g every 12 hours completed on 6/30.  Follow-up infectious disease 9.  Acute on chronic anemia.  Continue iron supplement.    Hb 7.5 on 7/2, 7.7 on 7 3 labs ordered for Monday  Cont to monitor 10.  Bilateral septic knees.  Status post total synovectomy irrigation debridement bilateral knees 05/02/2020 per Dr. Sharol Given.  Weightbearing as tolerated.   Continue ice to bilateral knees  11.  AKI.     Creatinine 1.39 on 7/2  Secondary to diuresis 12.  PAF.  Follow-up cardiology services.  Amiodarone as directed.    Continue Lopressor 12.5 mg twice daily.    Monitor with increased exertion  Repeat ECG reviewed, showing a flutter with prolonged QTC  Tachycardia, but appears to be sinus tach on 7/2 13.  Diabetes mellitus with hyperglycemia.  Latest hemoglobin A1c 6.8.  Lantus insulin 10 units twice  daily.   CBG (last 3)  Recent Labs    05/22/20 2108 05/23/20 0605 05/23/20 1136  GLUCAP 121* 111* 146*    Stable 7/3  Monitor with increased mobility 14.  Recent C3-4 ACDF 03/02/2020 per [name] for cervical myelopathy.  Cervical collar discontinued 15.  Hypothyroidism.  Synthroid 16. Drug-induced constipation.  MiraLAX daily, Senokot nightly  Adjust bowel meds as necessary 17.  Severe hypoalbuminemia  Supplement initiated on 6/25  IV supplementation x3 18.  Transaminitis: Resolved  LFTs within normal limits on 6/29 19.  Peripheral edema   See #12, #19  IV Lasix and IV albumin per cardiology, additional dose given on 6/29, transitioned to p.o. Lasix on 6/30  Echo reviewed from 6/9-showing normal EF  BNP elevated  Filed Weights   05/21/20 0508 05/22/20 0532 05/23/20 0607  Weight: 91 kg 90.8 kg 87.9 kg  Stable on 7/2 20.  Prolonged QTC  Monitor in conjunction with other medications  Appreciate cards recs 21.  Hypothyroidism  TSH elevated on 6/29 22.  Borderline soft blood pressures   Vitals:   05/22/20 1924 05/23/20 0603  BP: 95/69 109/78  Pulse: 84 80  Resp: 17 18  Temp: 98.1 F (36.7 C) 98.5 F (36.9 C)  SpO2: 99% 98%  controlled 23.  Hypokalemia-secondary to diuretics  Potassium 4.0 on 7/2  Supplement increased on 7/1  LOS: 9 days A FACE TO FACE EVALUATION WAS PERFORMED  Charlett Blake 05/23/2020, 12:32 PM

## 2020-05-23 NOTE — Progress Notes (Signed)
Physical Therapy Session Note  Patient Details  Name: Johnathan Murray MRN: [id number] Date of Birth: December 25, 1950  Today's Date: 05/23/2020 PT Individual Time: 1008-1108 PT Individual Time Calculation (min): 60 min   Short Term Goals: Week 2:  PT Short Term Goal 1 (Week 2): pt will transfer sit<>stand with LRAD min A overall PT Short Term Goal 2 (Week 2): Pt will ambulate 21f with LRAD mod A of 1 PT Short Term Goal 3 (Week 2): Pt will perform simulated car transfer with LRAD min A  Skilled Therapeutic Interventions/Progress Updates:   Pt received supine in bed and agreeable to PT. Supine>sit transfer with supervision assist and to maintain lumbar alignment. [facility] and RW> cues for step legnth initially. [facility] training with RW x 328fwith min assist and WC follow. No evidence of knee instability and no report of increased LPB or spasm.   [place]. AROM knee flexion x 10 BLE. Hip abduction x 8 BLE. Cues for full ROM in pain free range. Mild increase in LPB during knee extension.  Sit<>[facility] CGA from PT. Pt noted to have BLE shoulders in extreme extension [facility] noted in [facility]. Pt reports feeling like he is falling out of chair when sitting. Noted to have poor fitting Roho cushion. Replaced by PT with 16 inch depth foam cushion to allow improved pelivic position   Pt returned to room and performed stan transfer to bed with CGA for safety and cues for wide step length in turn. Sit>supine completed with Mod assist to BLE in log roll. Pt left supine in bed with call bell in reach and all needs met.           Therapy Documentation Precautions:  Precautions Precautions: Fall Precaution Comments: freq cues to adhere to sternal prec Required Braces or Orthoses: Spinal Brace Spinal Brace: Lumbar corset Other  Brace: for comfort Restrictions Weight Bearing Restrictions: No Other Position/Activity Restrictions: sternal precautions Pain: Denies at rest "not too bad". Does not quatify   Therapy/Group: Individual Therapy  AuLorie Phenix/01/2020, 11:09 AM

## 2020-05-23 NOTE — Progress Notes (Signed)
Progress Note  Patient Name: Johnathan Murray Date of Encounter: 05/23/2020  Primary Cardiologist: Jenean Lindau, MD  Subjective   No shortness of breath or palpitations at rest.  Intermittent back pain.  Inpatient Medications    Scheduled Meds: . [START ON 05/24/2020] amiodarone  200 mg Oral Daily  . amoxicillin  500 mg Oral Q12H  . apixaban  5 mg Oral BID  . feeding supplement (ENSURE ENLIVE)  237 mL Oral TID BM  . feeding supplement (PRO-STAT SUGAR FREE 64)  30 mL Oral BID  . ferrous [id number] C-folic acid  1 capsule Oral Q breakfast  . ferrous sulfate  325 mg Oral BID WC  . furosemide  40 mg Oral BID  . hydrocortisone cream   Topical TID  . insulin aspart  0-20 Units Subcutaneous TID WC  . insulin glargine  10 Units Subcutaneous BID  . levothyroxine  25 mcg Oral QAC breakfast  . lidocaine  1 patch Transdermal Q24H  . metoprolol tartrate  12.5 mg Oral BID  . oxyCODONE  10 mg Oral Q12H  . polyethylene glycol  17 g Oral Daily  . potassium chloride  20 mEq Oral BID  . pregabalin  100 mg Oral TID  . senna  1 tablet Oral QHS    PRN Meds: acetaminophen **OR** acetaminophen, albuterol, cyclobenzaprine, oxyCODONE, sodium chloride flush, sorbitol   Vital Signs    Vitals:   05/22/20 1526 05/22/20 1924 05/23/20 0603 05/23/20 0607  BP: 101/81 95/69 109/78   Pulse: (!) 109 84 80   Resp: 16 17 18    Temp: 99.5 F (37.5 C) 98.1 F (36.7 C) 98.5 F (36.9 C)   TempSrc: Oral Oral Oral   SpO2: 100% 99% 98%   Weight:    87.9 kg  Height:        Intake/Output Summary (Last 24 hours) at 05/23/2020 0933 Last data filed at 05/23/2020 0900 Gross per 24 hour  Intake 420 ml  Output 3900 ml  Net -3480 ml   Filed Weights   05/21/20 0508 05/22/20 0532 05/23/20 0607  Weight: 91 kg 90.8 kg 87.9 kg    Physical Exam   GEN: No acute distress.   Neck: No JVD. Cardiac:  Irregularly irregular, no gallop.  Respiratory: Nonlabored. Clear to auscultation bilaterally. GI:  Soft, nontender, bowel sounds present. MS:  Mild lower leg edema; No deformity.  Labs    Chemistry Recent Labs  Lab 05/18/20 1044 05/18/20 1044 05/19/20 0421 05/19/20 0421 05/20/20 0450 05/20/20 0450 05/21/20 0304 05/22/20 0406 05/23/20 0538  NA 136   < > 138   < > 138   < > 137 137 137  K 4.0   < > 3.8   < > 3.7   < > 3.6 4.0 4.3  CL 101   < > 101   < > 100   < > 101 100 101  CO2 26   < > 27   < > 28   < > 30 28 26   GLUCOSE 130*   < > 109*   < > 153*   < > 164* 193* 114*  BUN 18   < > 21   < > 22   < > 23 27* 28*  CREATININE 1.25*   < > 1.31*   < > 1.36*   < > 1.32* 1.39* 1.34*  CALCIUM 8.6*   < > 8.9   < > 8.7*   < > 8.7* 8.7* 9.0  PROT 5.2*  --  5.5*  --  5.3*  --   --   --   --   ALBUMIN 2.0*  --  2.3*  --  2.4*  --   --   --   --   AST 24  --  24  --  26  --   --   --   --   ALT 40  --  39  --  37  --   --   --   --   ALKPHOS 94  --  91  --  89  --   --   --   --   BILITOT 1.0  --  0.8  --  0.8  --   --   --   --   GFRNONAA 59*   < > 56*   < > 53*   < > 55* 52* 54*  GFRAA >60   < > >60   < > >60   < > >60 60* >60  ANIONGAP 9   < > 10   < > 10   < > 6 9 10    < > = values in this interval not displayed.     Hematology Recent Labs  Lab 05/21/20 0304 05/22/20 0406 05/23/20 0538  WBC 6.3 8.3 7.1  RBC 2.85* 2.76* 2.87*  HGB 7.9* 7.5* 7.7*  HCT 26.5* 25.5* 26.4*  MCV 93.0 92.4 92.0  MCH 27.7 27.2 26.8  MCHC 29.8* 29.4* 29.2*  RDW 17.2* 17.3* 17.2*  PLT 321 316 307    BNP Recent Labs  Lab 05/18/20 1044  BNP 134.7*     Radiology    No results found.  Cardiac Studies   TEE 04/29/2020: 1. Left ventricular ejection fraction, by estimation, is 55 to 60%. The  left ventricle has normal function. The left ventricle has no regional  wall motion abnormalities.  2. Right ventricular systolic function is mildly reduced. The right  ventricular size is normal.  3. No left atrial/left atrial appendage thrombus was detected. The LAA  emptying velocity was 50  cm/s.  4. Multiple regurgitant jets each with mild regurgitation. The mitral  valve is normal in structure. Mild to moderate mitral valve regurgitation.  No evidence of mitral stenosis.  5. Tricuspid valve regurgitation is mild to moderate.  6. The aortic valve has been repaired/replaced. Aortic valve  regurgitation is trivial. No aortic stenosis is present. There is a 23 mm  Homograft valve/aortic root valve present in the aortic position.  Procedure Date: 04/16/2020. Echo findings are  consistent with normal structure and function of the aortic valve  prosthesis.  7. The inferior vena cava is normal in size with greater than 50%  respiratory variability, suggesting right atrial pressure of 3 mmHg.   Patient Profile     69 y.o. male with a history of CAD s/p CABG in 2017 with most recent cath in 01/2020 showing 2/3 graft occluded, aortic stenosis s/p bioprosthetic AVR at time of CABGwith recently diagnosed endocarditis of aortic valve on 04/13/2020 now s/p debridement of aortic root abscess and aortic root replacement on 04/16/2020, paroxysmal atrial fibrillation on Eliquis,recurrent bilateral knee effusionsand septic arthritis with arthrocentesis (6/10),hypertension, dyslipidemia, diabetes on insulin, hypothyroidism, lumbar stenosis with neurogenic claudication, and cervical decompression and paroxysmal to persistent atrial fibrillation.  Assessment & Plan    1.  Persistent atrial fibrillation, CHA2DS2-VASc score of 3.  Currently on Eliquis, Lopressor, and amiodarone at 200 mg daily. ECG on June 28 showed atrial flutter with  variable conduction.  Current heart rate is in the 80s.  2.  Fluid overload, LVEF 55 to 60% by his most recent TEE in early June, significantly improved with continued diuresis on oral Lasix.  Net output of 3100 cc last 24 hours.  Renal function stable.  3.  Acute on chronic renal insufficiency, likely CKD stage II at baseline.  Current creatinine 1.34.  4.   Septic knees and discitis, on antibiotics and undergoing rehab at this time.  5.  Acute on chronic anemia, hemoglobin 7.7 up from 7.5.  6.  Aortic valve endocarditis status post debridement of aortic root abscess and aortic root replacement in late May.  Follow-up repeat ECG.  Continue amiodarone at 200 mg daily for now, also low-dose Lopressor and Eliquis.  No change in diuretic course at this time.  Would ultimately anticipate a follow-up cardioversion once he is over his active infection and has completed rehabilitation, potentially as an outpatient.  Signed, Rozann Lesches, MD  05/23/2020, 9:33 AM

## 2020-05-24 LAB — BASIC METABOLIC PANEL
Anion gap: 10 (ref 5–15)
BUN: 26 mg/dL — ABNORMAL HIGH (ref 8–23)
CO2: 27 mmol/L (ref 22–32)
Calcium: 9.1 mg/dL (ref 8.9–10.3)
Chloride: 100 mmol/L (ref 98–111)
Creatinine, Ser: 1.34 mg/dL — ABNORMAL HIGH (ref 0.61–1.24)
GFR calc Af Amer: >60 mL/min (ref >60)
GFR calc non Af Amer: 54 mL/min — ABNORMAL LOW (ref >60)
Glucose, Bld: 101 mg/dL — ABNORMAL HIGH (ref 70–99)
Potassium: 3.8 mmol/L (ref 3.5–5.1)
Sodium: 137 mmol/L (ref 135–145)

## 2020-05-24 LAB — GLUCOSE, CAPILLARY
Glucose-Capillary: 84 mg/dL (ref 70–99)
Glucose-Capillary: 145 mg/dL — ABNORMAL HIGH (ref 70–99)
Glucose-Capillary: 67 mg/dL — ABNORMAL LOW (ref 70–99)
Glucose-Capillary: 107 mg/dL — ABNORMAL HIGH (ref 70–99)
Glucose-Capillary: 118 mg/dL — ABNORMAL HIGH (ref 70–99)

## 2020-05-24 LAB — CBC WITH DIFFERENTIAL/PLATELET
Abs Immature Granulocytes: 0.03 10*3/uL (ref 0.00–0.07)
Basophils Absolute: 0.1 10*3/uL (ref 0.0–0.1)
Basophils Relative: 1 %
Eosinophils Absolute: 0.3 10*3/uL (ref 0.0–0.5)
Eosinophils Relative: 4 %
HCT: 28.9 % — ABNORMAL LOW (ref 39.0–52.0)
Hemoglobin: 8.5 g/dL — ABNORMAL LOW (ref 13.0–17.0)
Immature Granulocytes: 1 %
Lymphocytes Relative: 15 %
Lymphs Abs: 1 10*3/uL (ref 0.7–4.0)
MCH: 26.9 pg (ref 26.0–34.0)
MCHC: 29.4 g/dL — ABNORMAL LOW (ref 30.0–36.0)
MCV: 91.5 fL (ref 80.0–100.0)
Monocytes Absolute: 0.4 10*3/uL (ref 0.1–1.0)
Monocytes Relative: 6 %
Neutro Abs: 4.7 10*3/uL (ref 1.7–7.7)
Neutrophils Relative %: 73 %
Platelets: 319 10*3/uL (ref 150–400)
RBC: 3.16 MIL/uL — ABNORMAL LOW (ref 4.22–5.81)
RDW: 17.2 % — ABNORMAL HIGH (ref 11.5–15.5)
WBC: 6.5 10*3/uL (ref 4.0–10.5)
nRBC: 0 % (ref 0.0–0.2)

## 2020-05-24 NOTE — Progress Notes (Signed)
[place] PHYSICAL MEDICINE & REHABILITATION PROGRESS NOTE  Subjective/Complaints: No new issues, slept okay.  Knee pain overall improved.  Reviewed labs improvement in hemoglobin  ROS: Denies shortness of breath, CP, N/V/D  Objective: Vital Signs: Blood pressure 98/65, pulse 87, temperature 97.8 F (36.6 C), resp. rate 20, height 5\' 9"  (1.753 m), weight 84.5 kg, SpO2 99 %. No results found. Recent Labs    05/23/20 0538 05/24/20 0720  WBC 7.1 6.5  HGB 7.7* 8.5*  HCT 26.4* 28.9*  PLT 307 319   Recent Labs    05/23/20 0538 05/24/20 0720  NA 137 137  K 4.3 3.8  CL 101 100  CO2 26 27  GLUCOSE 114* 101*  BUN 28* 26*  CREATININE 1.34* 1.34*  CALCIUM 9.0 9.1    Physical Exam: BP 98/65 (BP Location: Left Arm)   Pulse 87   Temp 97.8 F (36.6 C)   Resp 20   Ht 5\' 9"  (1.753 m)   Wt 84.5 kg   SpO2 99%   BMI 27.51 kg/m   General: No acute distress Mood and affect are appropriate Heart: Regular rate and rhythm no rubs murmurs or extra sounds Lungs: Clear to auscultation, breathing unlabored, no rales or wheezes Abdomen: Positive bowel sounds, soft nontender to palpation, nondistended Extremities: No clubbing, cyanosis, or edema Skin: No evidence of breakdown, no evidence of rash   Neuro: Alert Motor:  Right lower extremity: Hip flexion, knee extension 3-/5, ankle dorsiflexion 4+/5 Left lower extremity: Hip flexion, knee extension 3/5, ankle dorsiflexion 4+/5  Assessment/Plan: 1. Functional deficits secondary to debility due to septic knees, disciitis which require 3+ hours per day of interdisciplinary therapy in a comprehensive inpatient rehab setting.  Physiatrist is providing close team supervision and 24 hour management of active medical problems listed below.  Physiatrist and rehab team continue to assess barriers to discharge/monitor patient progress toward functional and medical goals  Care Tool:  Bathing  Bathing activity did not occur: Refused Body  parts bathed by patient: Right arm, Left arm, Chest, Abdomen, Right upper leg, Left upper leg, Face, Front perineal area   Body parts bathed by helper: Right lower leg, Left lower leg     Bathing assist Assist Level: Minimal Assistance - Patient > 75%     Upper Body Dressing/Undressing Upper body dressing   What is the patient wearing?: Pull over shirt, Orthosis    Upper body assist Assist Level: Minimal Assistance - Patient > 75%    Lower Body Dressing/Undressing Lower body dressing      What is the patient wearing?: Pants     Lower body assist Assist for lower body dressing: Moderate Assistance - Patient 50 - 74%     Toileting Toileting Toileting Activity did not occur (Clothing management and hygiene only): N/A (no void or bm)  Toileting assist Assist for toileting: Maximal Assistance - Patient 25 - 49%     Transfers Chair/bed transfer  Transfers assist     Chair/bed transfer assist level: Moderate Assistance - Patient 50 - 74%     Locomotion Ambulation   Ambulation assist      Assist level: 2 helpers Assistive device: Walker-rolling Max distance: 66ft   Walk 10 feet activity   Assist     Assist level: 2 helpers Assistive device: Walker-rolling   Walk 50 feet activity   Assist Walk 50 feet with 2 turns activity did not occur: Safety/medical concerns         Walk 150 feet activity  Assist Walk 150 feet activity did not occur: Safety/medical concerns         Walk 10 feet on uneven surface  activity   Assist Walk 10 feet on uneven surfaces activity did not occur: Safety/medical concerns         Wheelchair     Assist Will patient use wheelchair at discharge?: Yes Type of Wheelchair: Manual    Wheelchair assist level: Supervision/Verbal cueing Max wheelchair distance: 45ft    Wheelchair 50 feet with 2 turns activity    Assist            Wheelchair 150 feet activity     Assist            Medical  Problem List and Plan: 1.  Decreased functional mobility secondary to sepsis/Enterococcus faecalis bacteremia/discitis/osteomyelitis of L4-5 status post aortic root replacement 04/17/2020 status post cardioversion for atrial fibrillation.  Sternal precautions/lumbar corset for comfort x 4 more weeks  Continue CIR PT, OT OK for grounds pass 2.  Antithrombotics: -DVT/anticoagulation: Resumed Eliquis 05/15/2020             -antiplatelet therapy: N/A 3. Pain Management: OxyContin sustained release 10 mg every 12 hours, Lyrica 100 mg 3 times daily Flexeril as as well as oxycodone needed  Relatively controlled on 7/2  Lidoderm patch ordered on 6/30 4. Mood: N/A meds- follow for ego support             -antipsychotic agents: N/A 5. Neuropsych: This patient is capable of making decisions on his own behalf. 6. Skin/Wound Care: Routine skin checks 7. Fluids/Electrolytes/Nutrition: Routine in and outs. 8.  ID/Enterococcus bacteremia.  Continue IV ampicillin 2 g every 4 hours as well as Rocephin 2 g every 12 hours completed on 6/30.  Follow-up infectious disease 9.  Acute on chronic anemia.  Continue iron supplement.    Hb 7.5 on 7/2, 7.7 on 7 3 labs ordered for Monday  Cont to monitor 10.  Bilateral septic knees.  Status post total synovectomy irrigation debridement bilateral knees 05/02/2020 per Dr. Sharol Given.  Weightbearing as tolerated.   Continue ice to bilateral knees  11.  AKI.     Creatinine 1.39 on 7/2  Secondary to diuresis 12.  PAF.  Follow-up cardiology services.  Amiodarone as directed.    Continue Lopressor 12.5 mg twice daily.    Monitor with increased exertion  Repeat ECG reviewed, showing a flutter with prolonged QTC  Tachycardia, but appears to be sinus tach on 7/2 13.  Diabetes mellitus with hyperglycemia.  Latest hemoglobin A1c 6.8.  Lantus insulin 10 units twice daily.   CBG (last 3)  Recent Labs    05/23/20 1719 05/23/20 2128 05/24/20 0616  GLUCAP 106* 140* 84    Stable  7/4  Monitor with increased mobility 14.  Recent C3-4 ACDF 03/02/2020 per [name] for cervical myelopathy.  Cervical collar discontinued 15.  Hypothyroidism.  Synthroid 16. Drug-induced constipation.  MiraLAX daily, Senokot nightly  Adjust bowel meds as necessary 17.  Severe hypoalbuminemia  Supplement initiated on 6/25  IV supplementation x3 18.  Transaminitis: Resolved  LFTs within normal limits on 6/29 19.  Peripheral edema   See #12, #19  IV Lasix and IV albumin per cardiology, additional dose given on 6/29, transitioned to p.o. Lasix on 6/30  Echo reviewed from 6/9-showing normal EF  BNP elevated  Filed Weights   05/22/20 0532 05/23/20 0607 05/24/20 0619  Weight: 90.8 kg 87.9 kg 84.5 kg   Trending down cardiology is  managing 20.  Prolonged QTC  Monitor in conjunction with other medications  Appreciate cards recs 21.  Hypothyroidism  TSH elevated on 6/29 22.  Borderline soft blood pressures   Vitals:   05/24/20 0619 05/24/20 0619  BP: 98/65 98/65  Pulse: 87 87  Resp: 20 20  Temp: 97.8 F (36.6 C) 97.8 F (36.6 C)  SpO2: 99% 99%  Stable in low normal range 23.  Hypokalemia-secondary to diuretics  Potassium 4.0 on 7/2  Supplement increased on 7/1  LOS: 10 days A FACE TO Gaffney E [place] Loughner 05/24/2020, 11:33 AM

## 2020-05-24 NOTE — Progress Notes (Signed)
Hypoglycemic Event  CBG: 67  Treatment: orange juice  Symptoms: none  Follow-up CBG: Time:1741 CBG Result:107  Possible Reasons for Event: unknown  Comments/MD notified:followed protocol    Jillyn Ledger

## 2020-05-25 ENCOUNTER — Inpatient Hospital Stay (HOSPITAL_COMMUNITY): Payer: BC Managed Care – PPO

## 2020-05-25 LAB — CBC WITH DIFFERENTIAL/PLATELET
Abs Immature Granulocytes: 0.02 10*3/uL (ref 0.00–0.07)
Basophils Absolute: 0.1 10*3/uL (ref 0.0–0.1)
Basophils Relative: 1 %
Eosinophils Absolute: 0.3 10*3/uL (ref 0.0–0.5)
Eosinophils Relative: 5 %
HCT: 29.3 % — ABNORMAL LOW (ref 39.0–52.0)
Hemoglobin: 8.9 g/dL — ABNORMAL LOW (ref 13.0–17.0)
Immature Granulocytes: 0 %
Lymphocytes Relative: 17 %
Lymphs Abs: 1.1 10*3/uL (ref 0.7–4.0)
MCH: 27.9 pg (ref 26.0–34.0)
MCHC: 30.4 g/dL (ref 30.0–36.0)
MCV: 91.8 fL (ref 80.0–100.0)
Monocytes Absolute: 0.4 10*3/uL (ref 0.1–1.0)
Monocytes Relative: 6 %
Neutro Abs: 4.5 10*3/uL (ref 1.7–7.7)
Neutrophils Relative %: 71 %
Platelets: 371 10*3/uL (ref 150–400)
RBC: 3.19 MIL/uL — ABNORMAL LOW (ref 4.22–5.81)
RDW: 17.2 % — ABNORMAL HIGH (ref 11.5–15.5)
WBC: 6.3 10*3/uL (ref 4.0–10.5)
nRBC: 0 % (ref 0.0–0.2)

## 2020-05-25 LAB — GLUCOSE, CAPILLARY
Glucose-Capillary: 86 mg/dL (ref 70–99)
Glucose-Capillary: 133 mg/dL — ABNORMAL HIGH (ref 70–99)
Glucose-Capillary: 51 mg/dL — ABNORMAL LOW (ref 70–99)
Glucose-Capillary: 139 mg/dL — ABNORMAL HIGH (ref 70–99)
Glucose-Capillary: 122 mg/dL — ABNORMAL HIGH (ref 70–99)

## 2020-05-25 LAB — BASIC METABOLIC PANEL
Anion gap: 10 (ref 5–15)
BUN: 29 mg/dL — ABNORMAL HIGH (ref 8–23)
CO2: 28 mmol/L (ref 22–32)
Calcium: 9.2 mg/dL (ref 8.9–10.3)
Chloride: 101 mmol/L (ref 98–111)
Creatinine, Ser: 1.44 mg/dL — ABNORMAL HIGH (ref 0.61–1.24)
GFR calc Af Amer: 57 mL/min — ABNORMAL LOW (ref >60)
GFR calc non Af Amer: 50 mL/min — ABNORMAL LOW (ref >60)
Glucose, Bld: 100 mg/dL — ABNORMAL HIGH (ref 70–99)
Potassium: 4 mmol/L (ref 3.5–5.1)
Sodium: 139 mmol/L (ref 135–145)

## 2020-05-25 MED ORDER — FUROSEMIDE 40 MG PO TABS
60.0000 mg | ORAL_TABLET | Freq: Every day | ORAL | Status: DC
  Administered 2020-05-26 – 2020-06-04 (×10): 60 mg via ORAL
  Filled 2020-05-25 (×10): qty 1

## 2020-05-25 NOTE — Progress Notes (Signed)
Patient blood sugar 51 prior to dinner. Upon assessment, patient is alert and oriented asymptomatic of hypoglycemia. OJ given, patient received dinner and ate 75%. Recheck glucose 139- SSI held.

## 2020-05-25 NOTE — Progress Notes (Signed)
Occupational Therapy Session Note  Patient Details  Name: Johnathan Murray MRN: [id number] Date of Birth: [birth year]  Today's Date: 05/25/2020 OT Individual Time: 3086-5784 OT Individual Time Calculation (min): 55 min   Session 2:  OT Individual Time: 1250-1400 OT Individual Time Calculation (min): 70 min    Short Term Goals: Week 2:  OT Short Term Goal 1 (Week 2): Pt will complete toileting tasks with mod assist at sit > stand level OT Short Term Goal 2 (Week 2): Pt will complete LB dressing at sit > stand level with mod assist OT Short Term Goal 3 (Week 2): Pt will complete bathing at shower level with min assist  Skilled Therapeutic Interventions/Progress Updates:    Session 1: Pt received supine with wife present, agreeable to therapy. Pt with no initial c/o pain at rest but reporting lumbar pain described as sharp during activity. LSO donned EOB with mod A. Pt self-monitored need for rest breaks to relieve pain. Throughout session pt required mod cueing for adherence to back precautions with pt frequently bending forward at the waist. Pt transitioned to EOB with CGA, using bed features. Pt completed sit > stand from elevated EOB with min A. Attempted to cue pt to not pull on RW but he and his wife adamantly stated this was the only way he could get up. Pt completed stand pivot to the w/c with min A. Pt propelled w/c 150 ft with (S), no pulling/discomfort reported in his chest. Pt completed 15 ft of functional mobility to the mat using the w/c with min A. Pt requested to work on dynamic standing balance. Pt completed short distance (within BOS) reaching activity to focus on reduced UE reliance on RW. Min A required throughout. Several rest breaks required throughout. During several rests pt was given posterior support via a large therapy ball for lumbar pain relief. Pt completed another 10 ft of functional mobility to his w/c with min A. Pt returned to supine in bed, min cueing for log  rolling technique and mod A required to lift BLE into bed. Pt was left supine with pillow under knees for lumbar release. Bed alarm set.    Session 2: Pt received supine with c/o back soreness but willing to get OOB for session. Pt completed bed mobility with close (S). LSO donned EOB with min A. Pt stood from elevated EOB with RW with min A. Stand pivot to the w/c with min A. Pt taken down to the therapy gym for time management. Pt completed ~5 ft of functional mobility with min A. Pt completed standing level functional stepping activity with 2 8 in cones 2x 10 repetitions each leg. Min-mod A required for balance stabilization. Pt reported increased pain with R LE lifting suddenly and required seated rest break. Pt given posterior support with therapy ball for lumbar rest. Next, small wedge was placed under pt seated to increase anterior pelvic tilt and more upright posture while pt completed closed chain reaching up with boomwacker (staying "in the tube" for sternal precautions). Pt held 3 lb dumbbells and completed bicep curls while still on wedge, 3x 12 repetitions. Pt completed core stabilization activity with BLE support removed with throwing/catching ball. Pt returned to his w/c via stand pivot with min-mod A, fatiguing. Pt was left supine with bed alarm set.   Therapy Documentation Precautions:  Precautions Precautions: Fall Precaution Comments: freq cues to adhere to sternal prec Required Braces or Orthoses: Spinal Brace Spinal Brace: Lumbar corset Other Brace: for comfort Restrictions  Weight Bearing Restrictions: No Other Position/Activity Restrictions: sternal precautions   Therapy/Group: Individual Therapy  Curtis Sites 05/25/2020, 6:44 AM

## 2020-05-25 NOTE — Progress Notes (Signed)
[place] PHYSICAL MEDICINE & REHABILITATION PROGRESS NOTE  Subjective/Complaints: Patient seen laying in bed this morning.  He states he slept fairly overnight due to having to have a bowel movement, but slept well afterward.  Wife at bedside, who has questions regarding location of infection.  Patient with questions regarding back pain.  He does note improvement with lidocaine patch.  ROS: Denies shortness of breath, CP, N/V/D  Objective: Vital Signs: Blood pressure 96/71, pulse 69, temperature 98.2 F (36.8 C), resp. rate 18, height 5\' 9"  (1.753 m), weight 86.3 kg, SpO2 100 %. No results found. Recent Labs    05/24/20 0720 05/25/20 0717  WBC 6.5 6.3  HGB 8.5* 8.9*  HCT 28.9* 29.3*  PLT 319 371   Recent Labs    05/24/20 0720 05/25/20 0717  NA 137 139  K 3.8 4.0  CL 100 101  CO2 27 28  GLUCOSE 101* 100*  BUN 26* 29*  CREATININE 1.34* 1.44*  CALCIUM 9.1 9.2    Physical Exam: BP 96/71 (BP Location: Right Arm)   Pulse 69   Temp 98.2 F (36.8 C)   Resp 18   Ht 5\' 9"  (1.753 m)   Wt 86.3 kg   SpO2 100%   BMI 28.10 kg/m  Constitutional: No distress . Vital signs reviewed. HENT: Normocephalic.  Atraumatic. Eyes: EOMI. No discharge. Cardiovascular: No JVD.  Irregularly irregular. Respiratory: Normal effort.  No stridor.  Bilaterally clear to auscultation. GI: Non-distended.  BS +. Skin: Bilateral knees with staples C/D/I Psych: Normal mood.  Normal behavior. Musc: Decreasing bilateral lower extremity edema. Neuro: Alert Motor:  Right lower extremity: Hip flexion, knee extension 3-/5, ankle dorsiflexion 4+/5 Left lower extremity: Hip flexion, knee extension 3+/5, ankle dorsiflexion 4+/5  Assessment/Plan: 1. Functional deficits secondary to debility due to septic knees, disciitis which require 3+ hours per day of interdisciplinary therapy in a comprehensive inpatient rehab setting.  Physiatrist is providing close team supervision and 24 hour management of active  medical problems listed below.  Physiatrist and rehab team continue to assess barriers to discharge/monitor patient progress toward functional and medical goals  Care Tool:  Bathing  Bathing activity did not occur: Refused Body parts bathed by patient: Right arm, Left arm, Chest, Abdomen, Right upper leg, Left upper leg, Face, Front perineal area   Body parts bathed by helper: Right lower leg, Left lower leg     Bathing assist Assist Level: Minimal Assistance - Patient > 75%     Upper Body Dressing/Undressing Upper body dressing   What is the patient wearing?: Pull over shirt, Orthosis    Upper body assist Assist Level: Minimal Assistance - Patient > 75%    Lower Body Dressing/Undressing Lower body dressing      What is the patient wearing?: Pants     Lower body assist Assist for lower body dressing: Moderate Assistance - Patient 50 - 74%     Toileting Toileting Toileting Activity did not occur Landscape architect and hygiene only): N/A (no void or bm)  Toileting assist Assist for toileting: Maximal Assistance - Patient 25 - 49%     Transfers Chair/bed transfer  Transfers assist     Chair/bed transfer assist level: Minimal Assistance - Patient > 75%     Locomotion Ambulation   Ambulation assist      Assist level: 2 helpers Assistive device: Walker-rolling Max distance: 67ft   Walk 10 feet activity   Assist     Assist level: 2 helpers Assistive device: [employer]  Walk 50 feet activity   Assist Walk 50 feet with 2 turns activity did not occur: Safety/medical concerns         Walk 150 feet activity   Assist Walk 150 feet activity did not occur: Safety/medical concerns         Walk 10 feet on uneven surface  activity   Assist Walk 10 feet on uneven surfaces activity did not occur: Safety/medical concerns         Wheelchair     Assist Will patient use wheelchair at discharge?: Yes Type of Wheelchair: Manual     Wheelchair assist level: Supervision/Verbal cueing Max wheelchair distance: 41ft    Wheelchair 50 feet with 2 turns activity    Assist            Wheelchair 150 feet activity     Assist            Medical Problem List and Plan: 1.  Decreased functional mobility secondary to sepsis/Enterococcus faecalis bacteremia/discitis/osteomyelitis of L4-5 status post aortic root replacement 04/17/2020 status post cardioversion for atrial fibrillation.  Sternal precautions/lumbar corset for comfort x 4 more weeks  Continue CIR  Plan to DC staples tomorrow 2.  Antithrombotics: -DVT/anticoagulation: Resumed Eliquis 05/15/2020             -antiplatelet therapy: N/A 3. Pain Management: OxyContin sustained release 10 mg every 12 hours, Lyrica 100 mg 3 times daily Flexeril as as well as oxycodone needed  Relatively controlled on 7/2  Lidoderm patch ordered on 6/30, with improvement 4. Mood: N/A meds- follow for ego support             -antipsychotic agents: N/A 5. Neuropsych: This patient is capable of making decisions on his own behalf. 6. Skin/Wound Care: Routine skin checks 7. Fluids/Electrolytes/Nutrition: Routine in and outs. 8.  ID/Enterococcus bacteremia.  IV ampicillin 2 g every 4 hours as well as Rocephin 2 g every 12 hours completed on 6/30.  Follow-up infectious disease 9.  Acute on chronic anemia.  Continue iron supplement.    Hb 8.9 on 7/5  Cont to monitor 10.  Bilateral septic knees.  Status post total synovectomy irrigation debridement bilateral knees 05/02/2020 per Dr. Sharol Given.  Weightbearing as tolerated.   Continue ice to bilateral knees  11.  AKI.     Creatinine 1.44 on 7/5  Secondary to diuresis 12.  PAF.  Follow-up cardiology services.  Amiodarone as directed.    Continue Lopressor 12.5 mg twice daily.    Monitor with increased exertion  Repeat ECG reviewed, showing a flutter with prolonged QTC  Recommendations per cardiology 13.  Diabetes mellitus with  hyperglycemia.  Latest hemoglobin A1c 6.8.  Lantus insulin 10 units twice daily.   CBG (last 3)  Recent Labs    05/24/20 2113 05/25/20 0609 05/25/20 1225  GLUCAP 118* 86 133*    Controlled on 7/5  Monitor with increased mobility 14.  Recent C3-4 ACDF 03/02/2020 per [name] for cervical myelopathy.  Cervical collar discontinued 15.  Hypothyroidism.  Synthroid 16. Drug-induced constipation.  MiraLAX daily, Senokot nightly  Adjust bowel meds as necessary 17.  Severe hypoalbuminemia  Supplement initiated on 6/25  IV supplementation x3 18.  Transaminitis: Resolved  LFTs within normal limits on 6/29 19.  Peripheral edema   See #12, #19  IV Lasix and IV albumin per cardiology, additional dose given on 6/29, transitioned to p.o. Lasix on 6/30  Echo reviewed from 6/9-showing normal EF  BNP elevated  [employer]  05/23/20 0607 05/24/20 0619 05/25/20 0525  Weight: 87.9 kg 84.5 kg 86.3 kg   Stable on 7/5 20.  Prolonged QTC  Monitor in conjunction with other medications  Appreciate cards recs 21.  Hypothyroidism  TSH elevated on 6/29 22.  Borderline soft blood pressures   Vitals:   05/25/20 0525 05/25/20 1511  BP: 103/64 96/71  Pulse: 70 69  Resp:  18  Temp: 98.3 F (36.8 C) 98.2 F (36.8 C)  SpO2: 99% 100%   Asymptomatic on 7/5 23.  Hypokalemia-secondary to diuretics  Potassium 4.0 on 7/5  Supplement increased on 7/1  LOS: 11 days A FACE TO FACE EVALUATION WAS PERFORMED  Anwyn Kriegel Lorie Phenix 05/25/2020, 4:05 PM

## 2020-05-25 NOTE — Progress Notes (Signed)
Physical Therapy Session Note  Patient Details  Name: Johnathan Murray MRN: [id number] Date of Birth: [birth year]  Today's Date: 05/25/2020 PT Individual Time: 1100-1158 PT Individual Time Calculation (min): 58 min   Short Term Goals: Week 1:  PT Short Term Goal 1 (Week 1): pt will perform supine to sit w/cga and adhere to precautions PT Short Term Goal 1 - Progress (Week 1): Met PT Short Term Goal 2 (Week 1): Pt will perform STS w/min assist PT Short Term Goal 2 - Progress (Week 1): Progressing toward goal PT Short Term Goal 3 (Week 1): pt will perform bed to chair w/min assist PT Short Term Goal 3 - Progress (Week 1): Met PT Short Term Goal 4 (Week 1): Pt will ambulate 51f w/LRAD and mod assist of 1 PT Short Term Goal 4 - Progress (Week 1): Progressing toward goal Week 2:  PT Short Term Goal 1 (Week 2): pt will transfer sit<>stand with LRAD min A overall PT Short Term Goal 2 (Week 2): Pt will ambulate 557fwith LRAD mod A of 1 PT Short Term Goal 3 (Week 2): Pt will perform simulated car transfer with LRAD min A  Skilled Therapeutic Interventions/Progress Updates:   Received pt supine in bed, pt agreeable to therapy, and reported pain 3/10 in low back (premedicated). Repositioning, rest breaks, and distraction done to reduce pain. Session with emphasis on functional mobility/transfers, generalized strengthening, dynamic standing balance/coordination, ambulation, and improved activity tolerance. Pt transferred supine<>sitting EOB with supervision with HOB elevated. Donned LSO total A, donned shoes min A, and pt transferred stand<>pivot bed<>WC with RW min A from elevated bed. [facility] for energy [facility] purposes and ambulated 3535fith RW min A +2 for close WC follow with no reports of increased back pain. Pt required cues for upright posture and RW safety. [facility] and transferred sit<>stand x 3 trials with RW min/mod A.  Pt required cues for anterior weight shifting and hand placement. While standing worked on dynamic standing balance and reaching outside BOS recreating pictures in pegboard with 1 UE support on RW and CGA for 1 minute x 1 trial and 1.5 minutes x 2 trials. Pt [facility] and transferred stand<>pivot WC<>bed with RW min A and sit<>supine with mod A for LE management. Concluded session with pt supine in bed, needs within reach, and bed alarm on.   Therapy Documentation Precautions:  Precautions Precautions: Fall Precaution Comments: freq cues to adhere to sternal prec Required Braces or Orthoses: Spinal Brace Spinal Brace: Lumbar corset Other Brace: for comfort Restrictions Weight Bearing Restrictions: No Other Position/Activity Restrictions: sternal precautions   Therapy/Group: Individual Therapy AnnAlfonse Alpers, DPT   05/25/2020, 7:26 AM

## 2020-05-25 NOTE — Progress Notes (Signed)
Progress Note  Patient Name: Johnathan Murray Date of Encounter: 05/25/2020  Primary Cardiologist: Jenean Lindau, MD   Subjective   Diuresed another 2.6L negative overnight. BP soft. Creatinine trending up to 1.44 today.   Inpatient Medications    Scheduled Meds: . amiodarone  200 mg Oral Daily  . amoxicillin  500 mg Oral Q12H  . apixaban  5 mg Oral BID  . feeding supplement (ENSURE ENLIVE)  237 mL Oral TID BM  . feeding supplement (PRO-STAT SUGAR FREE 64)  30 mL Oral BID  . ferrous [id number] C-folic acid  1 capsule Oral Q breakfast  . ferrous sulfate  325 mg Oral BID WC  . furosemide  40 mg Oral BID  . hydrocortisone cream   Topical TID  . insulin aspart  0-20 Units Subcutaneous TID WC  . insulin glargine  10 Units Subcutaneous BID  . levothyroxine  25 mcg Oral QAC breakfast  . lidocaine  1 patch Transdermal Q24H  . metoprolol tartrate  12.5 mg Oral BID  . oxyCODONE  10 mg Oral Q12H  . polyethylene glycol  17 g Oral Daily  . potassium chloride  20 mEq Oral BID  . pregabalin  100 mg Oral TID  . senna  1 tablet Oral QHS   Continuous Infusions:  PRN Meds: acetaminophen **OR** acetaminophen, albuterol, cyclobenzaprine, oxyCODONE, sodium chloride flush, sorbitol   Vital Signs    Vitals:   05/24/20 0619 05/24/20 1408 05/24/20 2048 05/25/20 0525  BP: 98/65 90/60 111/74 103/64  Pulse: 87 86 80 70  Resp: 20 14 18    Temp: 97.8 F (36.6 C) 98.5 F (36.9 C) 98.4 F (36.9 C) 98.3 F (36.8 C)  TempSrc:  Oral Oral Oral  SpO2: 99% 92% 98% 99%  Weight:    86.3 kg  Height:        Intake/Output Summary (Last 24 hours) at 05/25/2020 0839 Last data filed at 05/25/2020 0724 Gross per 24 hour  Intake 480 ml  Output 3425 ml  Net -2945 ml   Filed Weights   05/23/20 0607 05/24/20 0619 05/25/20 0525  Weight: 87.9 kg 84.5 kg 86.3 kg    Physical Exam   General: Well developed, well nourished, NAD Neck: Negative for carotid bruits. No JVD Lungs:Clear to  ausculation bilaterally. No wheezes, rales, or rhonchi. Breathing is unlabored. Cardiovascular: Irregularly irregular with S1 S2.  Abdomen: Soft, non-tender, non-distended. No obvious abdominal masses. Extremities: trace to 1+ BLE edema. Bilateral knee swelling post-op with staples in place. Radial pulses 2+ bilaterally Neuro: Alert and oriented. No focal deficits. No facial asymmetry. MAE spontaneously. Psych: Responds to questions appropriately with normal affect.    Labs    Chemistry Recent Labs  Lab 05/18/20 1044 05/18/20 1044 05/19/20 0421 05/19/20 0421 05/20/20 0450 05/21/20 0304 05/23/20 0538 05/24/20 0720 05/25/20 0717  NA 136   < > 138   < > 138   < > 137 137 139  K 4.0   < > 3.8   < > 3.7   < > 4.3 3.8 4.0  CL 101   < > 101   < > 100   < > 101 100 101  CO2 26   < > 27   < > 28   < > 26 27 28   GLUCOSE 130*   < > 109*   < > 153*   < > 114* 101* 100*  BUN 18   < > 21   < > 22   < >  28* 26* 29*  CREATININE 1.25*   < > 1.31*   < > 1.36*   < > 1.34* 1.34* 1.44*  CALCIUM 8.6*   < > 8.9   < > 8.7*   < > 9.0 9.1 9.2  PROT 5.2*  --  5.5*  --  5.3*  --   --   --   --   ALBUMIN 2.0*  --  2.3*  --  2.4*  --   --   --   --   AST 24  --  24  --  26  --   --   --   --   ALT 40  --  39  --  37  --   --   --   --   ALKPHOS 94  --  91  --  89  --   --   --   --   BILITOT 1.0  --  0.8  --  0.8  --   --   --   --   GFRNONAA 59*   < > 56*   < > 53*   < > 54* 54* 50*  GFRAA >60   < > >60   < > >60   < > >60 >60 57*  ANIONGAP 9   < > 10   < > 10   < > 10 10 10    < > = values in this interval not displayed.     Hematology Recent Labs  Lab 05/23/20 0538 05/24/20 0720 05/25/20 0717  WBC 7.1 6.5 6.3  RBC 2.87* 3.16* 3.19*  HGB 7.7* 8.5* 8.9*  HCT 26.4* 28.9* 29.3*  MCV 92.0 91.5 91.8  MCH 26.8 26.9 27.9  MCHC 29.2* 29.4* 30.4  RDW 17.2* 17.2* 17.2*  PLT 307 319 371    Cardiac EnzymesNo results for input(s): TROPONINI in the last 168 hours. No results for input(s): TROPIPOC in  the last 168 hours.   BNP Recent Labs  Lab 05/18/20 1044  BNP 134.7*     DDimer No results for input(s): DDIMER in the last 168 hours.   Radiology    No results found.  Telemetry    No currently on telelmetry   ECG    No new tracings - Personally Reviewed  Cardiac Studies   TEE and DCCV 04/29/20  IMPRESSIONS    1. Left ventricular ejection fraction, by estimation, is 55 to 60%. The  left ventricle has normal function. The left ventricle has no regional  wall motion abnormalities.  2. Right ventricular systolic function is mildly reduced. The right  ventricular size is normal.  3. No left atrial/left atrial appendage thrombus was detected. The LAA  emptying velocity was 50 cm/s.  4. Multiple regurgitant jets each with mild regurgitation. The mitral  valve is normal in structure. Mild to moderate mitral valve regurgitation.  No evidence of mitral stenosis.  5. Tricuspid valve regurgitation is mild to moderate.  6. The aortic valve has been repaired/replaced. Aortic valve  regurgitation is trivial. No aortic stenosis is present. There is a 23 mm  Homograft valve/aortic root valve present in the aortic position.  Procedure Date: 04/16/2020. Echo findings are  consistent with normal structure and function of the aortic valve  prosthesis.  7. The inferior vena cava is normal in size with greater than 50%  respiratory variability, suggesting right atrial pressure of 3 mmHg.   Conclusion(s)/Recommendation(s): Normal biventricular function without  evidence of hemodynamically significant valvular heart  disease.   FINDINGS  Left Ventricle: Left ventricular ejection fraction, by estimation, is 55  to 60%. The left ventricle has normal function. The left ventricle has no  regional wall motion abnormalities. The left ventricular internal cavity  size was normal in size. There is  no left ventricular hypertrophy.   Right Ventricle: The right ventricular size is  normal. No increase in  right ventricular wall thickness. Right ventricular systolic function is  mildly reduced.   Left Atrium: Left atrial size was normal in size. No left atrial/left  atrial appendage thrombus was detected. The LAA emptying velocity was 50  cm/s.   Right Atrium: Right atrial size was normal in size.   Pericardium: There is no evidence of pericardial effusion.   Mitral Valve: Multiple regurgitant jets each with mild regurgitation. The  mitral valve is normal in structure. Normal mobility of the mitral valve  leaflets. Mild to moderate mitral valve regurgitation. No evidence of  mitral valve stenosis.   Tricuspid Valve: The tricuspid valve is normal in structure. Tricuspid  valve regurgitation is mild to moderate. No evidence of tricuspid  stenosis.   Aortic Valve: No perivalvular regurgitation or abscess. The aortic valve  has been repaired/replaced. Aortic valve regurgitation is trivial. No  aortic stenosis is present. There is a 23 mm Homograft valve/aortic root  valve present in the aortic position.  Procedure Date: 04/16/2020. Echo findings are consistent with normal  structure and function of the aortic valve prosthesis.   Pulmonic Valve: The pulmonic valve was normal in structure. Pulmonic valve  regurgitation is trivial. No evidence of pulmonic stenosis.   Aorta: The aortic root is normal in size and structure. There is minimal  (Grade I) plaque involving the descending aorta.   Venous: The inferior vena cava is normal in size with greater than 50%  respiratory variability, suggesting right atrial pressure of 3 mmHg.   IAS/Shunts: No atrial level shunt detected by color flow Doppler. There is  no evidence of a patent foramen ovale. There is no evidence of an atrial  septal defect.     MR Peak grad:  75.3 mmHg  TRICUSPID VALVE  MR Mean grad:  42.0 mmHg  TR Peak grad:  18.1 mmHg  MR Vmax:     434.00 cm/s TR Vmax:    213.00 cm/s  MR  Vmean:    296.0 cm/s  MR PISA:     1.01 cm  MR PISA Eff ROA: 9 mm  MR PISA Radius: 0.40 cm  Patient Profile     69 y.o. 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 now s/p debridement of aortic root abscess and aortic root replacement on 04/16/2020, paroxysmal atrial fibrillation on Eliquis,recurrent bilateral knee effusionsand septic arthritis with arthrocentesis (6/10),hypertension, dyslipidemia, diabetes on insulin, hypothyroidism, lumbar stenosis with neurogenic claudication, and cervical decompression in 3/2021and a fib in rehab and back to main hospital at [facility] for amiodarone and thought to repeat DCCV in future if a fib persists and infection improves (this was written 02/05/20)now with volume overload and RVR in his a fib.    Assessment & Plan    1. Persisitent AF with fluid volume overload: -HR stable -Continue amiodarone 200mg  PO QD, metoprolol 12.5mg  po BID, Eliquis  -Net negative 24L - creatinine trending up -D/c BID lasix - switch to lasix  60 mg daily tomorrow (previous dose was 40 mg daily, not keeping up with edema)  2. Anemia: -Follow closely given AC with Eliquis  -Hb stable  3.  AKI: -Creatinine rising today - decrease to 60 mg po lasix daily -Check BMET tomorrow    4. Recent septic knee/osetomylitis L4-5: -PICC for IV abx>>stable  -Plan for team conference for goals of care and plan for home management  -Mangement per primary team   Pixie Casino, MD, [facility], [place] Director of the [facility] Lipid [facility]  Card[facility]ovascular [facility] Diplomate of the American Board of Clinical Lipidology Attending Cardiologist  Direct Dial: [phone]  Fax: [phone]  Website:  [email or web address]   For questions or updates, please  contact   Please consult [email or web address] for contact info under Cardiology/STEMI.

## 2020-05-26 ENCOUNTER — Inpatient Hospital Stay (HOSPITAL_COMMUNITY): Payer: BC Managed Care – PPO

## 2020-05-26 ENCOUNTER — Inpatient Hospital Stay (HOSPITAL_COMMUNITY): Payer: BC Managed Care – PPO | Admitting: Occupational Therapy

## 2020-05-26 LAB — CBC WITH DIFFERENTIAL/PLATELET
Abs Immature Granulocytes: 0.04 10*3/uL (ref 0.00–0.07)
Basophils Absolute: 0.1 10*3/uL (ref 0.0–0.1)
Basophils Relative: 1 %
Eosinophils Absolute: 0.3 10*3/uL (ref 0.0–0.5)
Eosinophils Relative: 5 %
HCT: 27.9 % — ABNORMAL LOW (ref 39.0–52.0)
Hemoglobin: 8.3 g/dL — ABNORMAL LOW (ref 13.0–17.0)
Immature Granulocytes: 1 %
Lymphocytes Relative: 17 %
Lymphs Abs: 1 10*3/uL (ref 0.7–4.0)
MCH: 27.1 pg (ref 26.0–34.0)
MCHC: 29.7 g/dL — ABNORMAL LOW (ref 30.0–36.0)
MCV: 91.2 fL (ref 80.0–100.0)
Monocytes Absolute: 0.4 10*3/uL (ref 0.1–1.0)
Monocytes Relative: 7 %
Neutro Abs: 4.3 10*3/uL (ref 1.7–7.7)
Neutrophils Relative %: 69 %
Platelets: 326 10*3/uL (ref 150–400)
RBC: 3.06 MIL/uL — ABNORMAL LOW (ref 4.22–5.81)
RDW: 17.3 % — ABNORMAL HIGH (ref 11.5–15.5)
WBC: 6.1 10*3/uL (ref 4.0–10.5)
nRBC: 0 % (ref 0.0–0.2)

## 2020-05-26 LAB — BASIC METABOLIC PANEL
Anion gap: 10 (ref 5–15)
BUN: 28 mg/dL — ABNORMAL HIGH (ref 8–23)
CO2: 27 mmol/L (ref 22–32)
Calcium: 9.1 mg/dL (ref 8.9–10.3)
Chloride: 100 mmol/L (ref 98–111)
Creatinine, Ser: 1.51 mg/dL — ABNORMAL HIGH (ref 0.61–1.24)
GFR calc Af Amer: 54 mL/min — ABNORMAL LOW (ref >60)
GFR calc non Af Amer: 47 mL/min — ABNORMAL LOW (ref >60)
Glucose, Bld: 110 mg/dL — ABNORMAL HIGH (ref 70–99)
Potassium: 4.2 mmol/L (ref 3.5–5.1)
Sodium: 137 mmol/L (ref 135–145)

## 2020-05-26 LAB — GLUCOSE, CAPILLARY
Glucose-Capillary: 113 mg/dL — ABNORMAL HIGH (ref 70–99)
Glucose-Capillary: 125 mg/dL — ABNORMAL HIGH (ref 70–99)
Glucose-Capillary: 132 mg/dL — ABNORMAL HIGH (ref 70–99)
Glucose-Capillary: 87 mg/dL (ref 70–99)

## 2020-05-26 MED ORDER — INSULIN GLARGINE 100 UNIT/ML ~~LOC~~ SOLN
8.0000 [IU] | Freq: Two times a day (BID) | SUBCUTANEOUS | Status: DC
  Administered 2020-05-26 – 2020-06-03 (×16): 8 [IU] via SUBCUTANEOUS
  Filled 2020-05-26 (×19): qty 0.08

## 2020-05-26 NOTE — Progress Notes (Signed)
Occupational Therapy Session Note  Patient Details  Name: Johnathan Murray MRN: [id number] Date of Birth: 1951/08/11  Today's Date: 05/26/2020 OT Individual Time: 1105-1200 OT Individual Time Calculation (min): 55 min    Short Term Goals: Week 2:  OT Short Term Goal 1 (Week 2): Pt will complete toileting tasks with mod assist at sit > stand level OT Short Term Goal 2 (Week 2): Pt will complete LB dressing at sit > stand level with mod assist OT Short Term Goal 3 (Week 2): Pt will complete bathing at shower level with min assist  Skilled Therapeutic Interventions/Progress Updates:    Treatment session with focus on sit > stand and dynamic standing balance as needed for transfers and LB self-care tasks.  Pt received supine in bed reporting some discomfort in lower back, but agreeable to therapy session.  Pt completed bed mobility with use of bed rail and supervision.  Completed sit > stand from elevated EOB with CGA and ambulated 5' with RW to w/c with min assist.  Ambulated 15' x2 with RW with min assist and pt providing self-cues for sequencing of stepping pattern.  Engaged in sit > stand from edge of mat with focus on anterior weight shift to increase lift off through legs and decrease WB through BUE (d/t sternal precautions).  Engaged in horse shoe toss activity, incorporating alternating reach with RUE and LUE to challenge standing balance and alternating UE as needed for clothing management.  Pt required CGA to mod assist with sit > stand as he fatigued and reports increased "catching" in lower back.  Provided support at lower back with large therapy ball during rest breaks for back support.  Engaged in toe tapping on cones with 2 sets of 5 with BLE, pt unable to alternate but able to weight shift to complete with each leg.  Returned to room and transferred back to EOB with min assist, required assistance to lift BLE in to bed.  Pt remained semi-reclined with all needs in reach.  Therapy  Documentation Precautions:  Precautions Precautions: Fall Precaution Comments: freq cues to adhere to sternal prec Required Braces or Orthoses: Spinal Brace Spinal Brace: Lumbar corset Other Brace: for comfort Restrictions Weight Bearing Restrictions: No Other Position/Activity Restrictions: sternal precautions Pain: Pt reports "catching" in lower back with sit > stand and dynamic reaching, did not rate.  Premedicated  Therapy/Group: Individual Therapy  Simonne Come 05/26/2020, 12:03 PM

## 2020-05-26 NOTE — Progress Notes (Signed)
Physical Therapy Session Note  Patient Details  Name: Johnathan Murray MRN: [id number] Date of Birth: 10-08-1951  Today's Date: 05/26/2020 PT Individual Time: 0900-1003 and 1345-1457 PT Individual Time Calculation (min): 63 min and 72 min  Short Term Goals: Week 1:  PT Short Term Goal 1 (Week 1): pt will perform supine to sit w/cga and adhere to precautions PT Short Term Goal 1 - Progress (Week 1): Met PT Short Term Goal 2 (Week 1): Pt will perform STS w/min assist PT Short Term Goal 2 - Progress (Week 1): Progressing toward goal PT Short Term Goal 3 (Week 1): pt will perform bed to chair w/min assist PT Short Term Goal 3 - Progress (Week 1): Met PT Short Term Goal 4 (Week 1): Pt will ambulate 18f w/LRAD and mod assist of 1 PT Short Term Goal 4 - Progress (Week 1): Progressing toward goal Week 2:  PT Short Term Goal 1 (Week 2): pt will transfer sit<>stand with LRAD min A overall PT Short Term Goal 2 (Week 2): Pt will ambulate 510fwith LRAD mod A of 1 PT Short Term Goal 3 (Week 2): Pt will perform simulated car transfer with LRAD min A  Skilled Therapeutic Interventions/Progress Updates:   Treatment Session 1: 0900-1003 63 min Received pt supine in bed, pt agreeable to therapy, and denied any pain at start of session but reported increased low back pain with mobility. Repositioning, rest breaks, and distraction done to reduce pain levels. Session with emphasis on functional mobility/transfers, dressing, generalized strengthening, dynamic standing balance/coordination, ambulation, and improved activity tolerance. Donned bilateral ted hose total A and provided pt with washcloth and deodorant and pt able to wash face/under arms and apply deodorant with supervision. Donned pull over shirt in supine with min A. Pt transferred supine<>sitting EOB with HOB elevated and supervision and donned shoes sitting EOB with min A. Donned LSO total A sitting EOB. Pt called his wife to get home measurements  for doorways and bed heights. Pt transferred bed<>WC stand<>pivot with RW min A from elevated bed. [facility] Therapist took W[facility]asurements for 18x18 WC and 16x16 WC to determine if it would fit through pt's doorways at home. Ultimately determined that doorways are too small and PT recommended pt walk short distances room to room with assist from his wife; pt in agreement. [facility] and ambulated 2759fith RW min A with cues for proper stride length and for upright posture. Pt transferred mat<>WC stand pivot with RW min A and reported urge to have BM and was [facility]. Stand<>pivot with RW min A to bedside commode and pt required mod A to doff pants. Pt continent of bowel and required total A for peri-care in standing and mod A to pull pants over hips. Pt transferred bedside commode<>bed stand<>pivot with RW min A, doffed shoes with supervision, and transferred sit<>supine with mod A for LE management. Concluded session with pt supine in bed, needs within reach, and bed alarm on.  Treatment Session 2: [id number] Received pt supine in bed, pt agreeable to therapy, and denied any pain at start of session but reported increased back pain with mobility. Repositioning, rest breaks, and distraction done to reduce pain levels. Session with emphasis on functional mobility/transfers, generalized strengthening, dynamic standing balance/coordination, ambulation, simulated car transfers, stair navigation, and improved activity tolerance. Pt voided in urinal with supervision and NT present  to assess vitals. Pt transferred supine<>sitting EOB with HOB elevated and supervision and donned shoes with min A. Pt transferred bed<>WC stand<>pivot with RW min A from elevated bed. Pt transported to ortho gym in [facility] total A and performed simulated car transfer with RW mod A. Pt required verbal cues for turning technique and RW safety. Pt  transported to therapy gym in [facility] total A and navigated 2 steps with 2 rails mod/max A ascending forwards with a step to pattern and descending backwards with a step to pattern. Pt required cues for upright posture as pt with tendency to flex trunk forward and to flex knees. Pt transported to dayroom in [facility] total A and ambulated 63f x2 trials with RW min A. Pt required cues for increased stride length and upright posture. Pt required multiple rest/water breaks throughout session due to increased fatigue. Pt's wife present at end of session and educated her on pt's CLOF and discussed methods for navigating steps and ambulating at home. [facility] and pt transferred WC<>bed stand<>pivot with RW min A from pt's wife with therapist educating on body positioning and technique. Doffed shoes sitting EOB with supervision and transferred sit<>supine mod A for LE management. Concluded session with pt supine in bed, needs within reach, and bed alarm not on but pt's wife present at bedside.    Therapy Documentation Precautions:  Precautions Precautions: Fall Precaution Comments: freq cues to adhere to sternal prec Required Braces or Orthoses: Spinal Brace Spinal Brace: Lumbar corset Other Brace: for comfort Restrictions Weight Bearing Restrictions: No Other Position/Activity Restrictions: sternal precautions  Therapy/Group: Individual Therapy AAlfonse AlpersPT, DPT   05/26/2020, 7:23 AM

## 2020-05-26 NOTE — Progress Notes (Signed)
[place] PHYSICAL MEDICINE & REHABILITATION PROGRESS NOTE  Subjective/Complaints: Patient seen sitting up in bed this morning.  He states he slept fairly overnight.  He has questions regarding changes in diuretics.  He was seen by cardiology yesterday, notes reviewed-diuretics adjusted.  ROS: Denies shortness of breath, CP, N/V/D  Objective: Vital Signs: Blood pressure (!) 97/50, pulse (!) 57, temperature 98.3 F (36.8 C), resp. rate 18, height 5\' 9"  (1.753 m), weight 85.1 kg, SpO2 98 %. No results found. Recent Labs    05/25/20 0717 05/26/20 0554  WBC 6.3 6.1  HGB 8.9* 8.3*  HCT 29.3* 27.9*  PLT 371 326   Recent Labs    05/25/20 0717 05/26/20 0554  NA 139 137  K 4.0 4.2  CL 101 100  CO2 28 27  GLUCOSE 100* 110*  BUN 29* 28*  CREATININE 1.44* 1.51*  CALCIUM 9.2 9.1    Physical Exam: BP (!) 97/50 (BP Location: Left Arm)   Pulse (!) 57   Temp 98.3 F (36.8 C)   Resp 18   Ht 5\' 9"  (1.753 m)   Wt 85.1 kg   SpO2 98%   BMI 27.71 kg/m  Constitutional: No distress . Vital signs reviewed. HENT: Normocephalic.  Atraumatic. Eyes: EOMI. No discharge. Cardiovascular: No JVD.  Regular, tachycardia. Respiratory: Normal effort.  No stridor.  Bilaterally clear to auscultation. GI: Non-distended.  BS +. Skin: Bilateral knees with staples C/D/I Psych: Normal mood.  Normal behavior. Musc: Bilateral knees with edema and tenderness, improving Neuro: Alert Motor:  Right lower extremity: Hip flexion, knee extension 3/5, ankle dorsiflexion 4+/5 Left lower extremity: Hip flexion, knee extension 3+/5, ankle dorsiflexion 4+/5  Assessment/Plan: 1. Functional deficits secondary to debility due to septic knees, disciitis which require 3+ hours per day of interdisciplinary therapy in a comprehensive inpatient rehab setting.  Physiatrist is providing close team supervision and 24 hour management of active medical problems listed below.  Physiatrist and rehab team continue to assess  barriers to discharge/monitor patient progress toward functional and medical goals  Care Tool:  Bathing  Bathing activity did not occur: Refused Body parts bathed by patient: Right arm, Left arm, Chest, Abdomen, Right upper leg, Left upper leg, Face, Front perineal area   Body parts bathed by helper: Right lower leg, Left lower leg     Bathing assist Assist Level: Minimal Assistance - Patient > 75%     Upper Body Dressing/Undressing Upper body dressing   What is the patient wearing?: Pull over shirt, Orthosis    Upper body assist Assist Level: Minimal Assistance - Patient > 75%    Lower Body Dressing/Undressing Lower body dressing      What is the patient wearing?: Pants     Lower body assist Assist for lower body dressing: Moderate Assistance - Patient 50 - 74%     Toileting Toileting Toileting Activity did not occur Landscape architect and hygiene only): N/A (no void or bm)  Toileting assist Assist for toileting: Maximal Assistance - Patient 25 - 49%     Transfers Chair/bed transfer  Transfers assist     Chair/bed transfer assist level: Minimal Assistance - Patient > 75%     Locomotion Ambulation   Ambulation assist      Assist level: 2 helpers Assistive device: Walker-rolling Max distance: 36ft   Walk 10 feet activity   Assist     Assist level: 2 helpers Assistive device: Walker-rolling   Walk 50 feet activity   Assist Walk 50 feet with 2  turns activity did not occur: Safety/medical concerns         Walk 150 feet activity   Assist Walk 150 feet activity did not occur: Safety/medical concerns         Walk 10 feet on uneven surface  activity   Assist Walk 10 feet on uneven surfaces activity did not occur: Safety/medical concerns         Wheelchair     Assist Will patient use wheelchair at discharge?: Yes Type of Wheelchair: Manual    Wheelchair assist level: Supervision/Verbal cueing Max wheelchair distance: 10ft     Wheelchair 50 feet with 2 turns activity    Assist            Wheelchair 150 feet activity     Assist            Medical Problem List and Plan: 1.  Decreased functional mobility secondary to sepsis/Enterococcus faecalis bacteremia/discitis/osteomyelitis of L4-5 status post aortic root replacement 04/17/2020 status post cardioversion for atrial fibrillation.  Sternal precautions/lumbar corset for comfort x 4 more weeks  Continue CIR  DC staples 2.  Antithrombotics: -DVT/anticoagulation: Resumed Eliquis 05/15/2020             -antiplatelet therapy: N/A 3. Pain Management: OxyContin sustained release 10 mg every 12 hours, Lyrica 100 mg 3 times daily Flexeril as as well as oxycodone needed  Relatively controlled on 7/6  Lidoderm patch ordered on 6/30, with improvement 4. Mood: N/A meds- follow for ego support             -antipsychotic agents: N/A 5. Neuropsych: This patient is capable of making decisions on his own behalf. 6. Skin/Wound Care: Routine skin checks 7. Fluids/Electrolytes/Nutrition: Routine in and outs. 8.  ID/Enterococcus bacteremia.  IV ampicillin 2 g every 4 hours as well as Rocephin 2 g every 12 hours completed on 6/30.  Follow-up infectious disease 9.  Acute on chronic anemia.  Continue iron supplement.    Hb 8.3 on 7/6  Cont to monitor 10.  Bilateral septic knees.  Status post total synovectomy irrigation debridement bilateral knees 05/02/2020 per Dr. Sharol Given.  Weightbearing as tolerated.   Continue ice to bilateral knees  11.  AKI.     Creatinine 1.51 on 7/6  Secondary to diuresis 12.  PAF.  Follow-up cardiology services.  Amiodarone as directed.    Continue Lopressor 12.5 mg twice daily.    Monitor with increased exertion  Repeat ECG reviewed, showing a flutter with prolonged QTC  Recommendations per cardiology 13.  Diabetes mellitus with hyperglycemia.  Latest hemoglobin A1c 6.8.    Lantus insulin 10 units twice daily, decreased to 8 units on  7/6.   CBG (last 3)  Recent Labs    05/25/20 1851 05/25/20 2053 05/26/20 0558  GLUCAP 139* 122* 113*    Labile on 7/6  Monitor with increased mobility 14.  Recent C3-4 ACDF 03/02/2020 per [name] for cervical myelopathy.  Cervical collar discontinued 15.  Hypothyroidism.  Synthroid 16. Drug-induced constipation.  MiraLAX daily, Senokot nightly  Adjust bowel meds as necessary 17.  Severe hypoalbuminemia  Supplement initiated on 6/25  IV supplementation x3 completed 18.  Transaminitis: Resolved  LFTs within normal limits on 6/29 19.  Peripheral edema  See #12, #19  IV Lasix and IV albumin per cardiology, additional dose given on 6/29, transitioned to p.o. Lasix on 6/30, decreased on 7/6  Echo reviewed from 6/9-showing normal EF  BNP elevated  Filed Weights   05/24/20 0619 05/25/20  3235 05/26/20 0506  Weight: 84.5 kg 86.3 kg 85.1 kg   Stable on 7/6 20.  Prolonged QTC  Monitor in conjunction with other medications  Appreciate cards recs 21.  Hypothyroidism  TSH elevated on 6/29 22.  Borderline soft blood pressures   Vitals:   05/25/20 1910 05/26/20 0506  BP: 96/80 (!) 97/50  Pulse: 82 (!) 57  Resp:    Temp: 98.3 F (36.8 C) 98.3 F (36.8 C)  SpO2: 90% 98%   Secondary to diuresis, asymptomatic on 7/6 23.  Hypokalemia-secondary to diuretics  Potassium 4.2 on 7/6  Supplement increased on 7/1  LOS: 12 days A FACE TO FACE EVALUATION WAS PERFORMED  Kayzlee Wirtanen Lorie Phenix 05/26/2020, 8:29 AM

## 2020-05-26 NOTE — Progress Notes (Addendum)
Progress Note  Patient Name: Bradford Cazier Date of Encounter: 05/26/2020  [facility] Cardiologist: Jenean Lindau, MD   Subjective   No CP, no SOB. BP still low. No fevers.  We just him working with physical therapy to walk 27 feet.  Heart rate obviously increased after this effort.  This is reasonable.  Inpatient Medications    Scheduled Meds: . amiodarone  200 mg Oral Daily  . amoxicillin  500 mg Oral Q12H  . apixaban  5 mg Oral BID  . feeding supplement (ENSURE ENLIVE)  237 mL Oral TID BM  . feeding supplement (PRO-STAT SUGAR FREE 64)  30 mL Oral BID  . ferrous [id number] C-folic acid  1 capsule Oral Q breakfast  . ferrous sulfate  325 mg Oral BID WC  . furosemide  60 mg Oral Daily  . hydrocortisone cream   Topical TID  . insulin aspart  0-20 Units Subcutaneous TID WC  . insulin glargine  8 Units Subcutaneous BID  . levothyroxine  25 mcg Oral QAC breakfast  . lidocaine  1 patch Transdermal Q24H  . metoprolol tartrate  12.5 mg Oral BID  . oxyCODONE  10 mg Oral Q12H  . polyethylene glycol  17 g Oral Daily  . potassium chloride  20 mEq Oral BID  . pregabalin  100 mg Oral TID  . senna  1 tablet Oral QHS   Continuous Infusions:  PRN Meds: acetaminophen **OR** acetaminophen, albuterol, cyclobenzaprine, oxyCODONE, sodium chloride flush, sorbitol   Vital Signs    Vitals:   05/25/20 0525 05/25/20 1511 05/25/20 1910 05/26/20 0506  BP: 103/64 96/71 96/80  (!) 97/50  Pulse: 70 69 82 (!) 57  Resp:  18    Temp: 98.3 F (36.8 C) 98.2 F (36.8 C) 98.3 F (36.8 C) 98.3 F (36.8 C)  TempSrc: Oral     SpO2: 99% 100% 90% 98%  Weight: 86.3 kg   85.1 kg  Height:        Intake/Output Summary (Last 24 hours) at 05/26/2020 0935 Last data filed at 05/26/2020 0700 Gross per 24 hour  Intake 780 ml  Output 2475 ml  Net -1695 ml   Last 3 Weights 05/26/2020 05/25/2020 05/24/2020  Weight (lbs) 187 lb 9.8 oz 190 lb 4.1 oz 186 lb 4.6 oz  Weight (kg) 85.1 kg 86.3 kg  84.5 kg        Physical Exam   GEN: No acute distress.   Neck: No JVD Cardiac: IRRR, no murmurs, rubs, or gallops.  Respiratory: Clear to auscultation bilaterally. GI: Soft, nontender, non-distended  MS: trace edema; No deformity. Bilat knee wounds Neuro:  Nonfocal  Psych: Normal affect   Labs    High Sensitivity Troponin:  No results for input(s): TROPONINIHS in the last 720 hours.    Chemistry Recent Labs  Lab 05/20/20 0450 05/21/20 0304 05/24/20 0720 05/25/20 0717 05/26/20 0554  NA 138   < > 137 139 137  K 3.7   < > 3.8 4.0 4.2  CL 100   < > 100 101 100  CO2 28   < > 27 28 27   GLUCOSE 153*   < > 101* 100* 110*  BUN 22   < > 26* 29* 28*  CREATININE 1.36*   < > 1.34* 1.44* 1.51*  CALCIUM 8.7*   < > 9.1 9.2 9.1  PROT 5.3*  --   --   --   --   ALBUMIN 2.4*  --   --   --   --  AST 26  --   --   --   --   ALT 37  --   --   --   --   ALKPHOS 89  --   --   --   --   BILITOT 0.8  --   --   --   --   GFRNONAA 53*   < > 54* 50* 47*  GFRAA >60   < > >60 57* 54*  ANIONGAP 10   < > 10 10 10    < > = values in this interval not displayed.     Hematology Recent Labs  Lab 05/24/20 0720 05/25/20 0717 05/26/20 0554  WBC 6.5 6.3 6.1  RBC 3.16* 3.19* 3.06*  HGB 8.5* 8.9* 8.3*  HCT 28.9* 29.3* 27.9*  MCV 91.5 91.8 91.2  MCH 26.9 27.9 27.1  MCHC 29.4* 30.4 29.7*  RDW 17.2* 17.2* 17.3*  PLT 319 371 326    BNPNo results for input(s): BNP, PROBNP in the last 168 hours.   DDimer No results for input(s): DDIMER in the last 168 hours.   Radiology    No results found.  Cardiac Studies   EF 55% Aortic valve replaced Aortic root repair (abscess)  Patient Profile     69 y.o. male  with a hx of CAD s/p CABG in 2017 with most recent cath in 01/2020 showing 2/3 graft occluded, aortic stenosis s/p bioprosthetic AVR at time of CABGwith recently diagnosed endocarditis of aortic valve on 04/13/2020 now s/p debridement of aortic root abscess and aortic root replacement on  04/16/2020, paroxysmal atrial fibrillation on Eliquis,recurrent bilateral knee effusionsand septic arthritis with arthrocentesis (6/10),hypertension, dyslipidemia, diabetes on insulin, hypothyroidism, lumbar stenosis with neurogenic claudication, and cervical decompression in 3/2021and a fib in rehab and back to main hospital at [facility] for amiodarone and thought to repeat DCCV in future if a fib persists and infection improves (this was written 02/05/20)now with volume overload and RVR in his a fib.  Assessment & Plan    1. Persisitent AF with fluid volume overload: -HR stable -Continue amiodarone 200mg  PO QD, metoprolol 12.5mg  po BID, Eliquis  -Net negative 24L - creatinine trending up -D/c'd BID lasix - switched to lasix 60 mg daily (previous dose was 40 mg daily, not keeping up with edema). Continue with watching creatinine.  - has failed cardioversion in the setting of ongoing infection.   2. Anemia: -Follow closely given AC with Eliquis --has required PRBC in the past -Hb stable  3.  AKI: -Creatinine rising mildly again today - decreasedto 60 mg po lasix daily -Check BMETagain tomorrow    4. Recent septic knee/osetomylitis L4-5: -PICC for IV abx>>stable  -Plan for team conference for goals of care and plan for home management  -Mangement per primary team      For questions or updates, please contact [place] Please consult [email or web address] for contact info under        Signed, Candee Furbish, MD  05/26/2020, 9:35 AM

## 2020-05-26 NOTE — Progress Notes (Signed)
Staples removed from bilateral knees. Patient tolerated well. Incisions appear well healed and approximated- cleansed with NS and DCD applied d/t scant bleeding.

## 2020-05-27 ENCOUNTER — Inpatient Hospital Stay (HOSPITAL_COMMUNITY): Payer: BC Managed Care – PPO

## 2020-05-27 ENCOUNTER — Inpatient Hospital Stay (HOSPITAL_COMMUNITY): Payer: BC Managed Care – PPO | Admitting: Occupational Therapy

## 2020-05-27 ENCOUNTER — Ambulatory Visit: Payer: Self-pay | Admitting: Surgery

## 2020-05-27 ENCOUNTER — Ambulatory Visit: Payer: BC Managed Care – PPO | Admitting: Cardiology

## 2020-05-27 LAB — CBC WITH DIFFERENTIAL/PLATELET
Abs Immature Granulocytes: 0.02 10*3/uL (ref 0.00–0.07)
Basophils Absolute: 0.1 10*3/uL (ref 0.0–0.1)
Basophils Relative: 1 %
Eosinophils Absolute: 0.3 10*3/uL (ref 0.0–0.5)
Eosinophils Relative: 5 %
HCT: 28.9 % — ABNORMAL LOW (ref 39.0–52.0)
Hemoglobin: 8.5 g/dL — ABNORMAL LOW (ref 13.0–17.0)
Immature Granulocytes: 0 %
Lymphocytes Relative: 19 %
Lymphs Abs: 1.1 10*3/uL (ref 0.7–4.0)
MCH: 27.1 pg (ref 26.0–34.0)
MCHC: 29.4 g/dL — ABNORMAL LOW (ref 30.0–36.0)
MCV: 92 fL (ref 80.0–100.0)
Monocytes Absolute: 0.5 10*3/uL (ref 0.1–1.0)
Monocytes Relative: 8 %
Neutro Abs: 3.8 10*3/uL (ref 1.7–7.7)
Neutrophils Relative %: 67 %
Platelets: 323 10*3/uL (ref 150–400)
RBC: 3.14 MIL/uL — ABNORMAL LOW (ref 4.22–5.81)
RDW: 17.4 % — ABNORMAL HIGH (ref 11.5–15.5)
WBC: 5.8 10*3/uL (ref 4.0–10.5)
nRBC: 0 % (ref 0.0–0.2)

## 2020-05-27 LAB — BASIC METABOLIC PANEL
Anion gap: 9 (ref 5–15)
BUN: 30 mg/dL — ABNORMAL HIGH (ref 8–23)
CO2: 28 mmol/L (ref 22–32)
Calcium: 9.2 mg/dL (ref 8.9–10.3)
Chloride: 100 mmol/L (ref 98–111)
Creatinine, Ser: 1.44 mg/dL — ABNORMAL HIGH (ref 0.61–1.24)
GFR calc Af Amer: 57 mL/min — ABNORMAL LOW (ref >60)
GFR calc non Af Amer: 50 mL/min — ABNORMAL LOW (ref >60)
Glucose, Bld: 117 mg/dL — ABNORMAL HIGH (ref 70–99)
Potassium: 4.2 mmol/L (ref 3.5–5.1)
Sodium: 137 mmol/L (ref 135–145)

## 2020-05-27 LAB — GLUCOSE, CAPILLARY
Glucose-Capillary: 109 mg/dL — ABNORMAL HIGH (ref 70–99)
Glucose-Capillary: 137 mg/dL — ABNORMAL HIGH (ref 70–99)
Glucose-Capillary: 135 mg/dL — ABNORMAL HIGH (ref 70–99)
Glucose-Capillary: 138 mg/dL — ABNORMAL HIGH (ref 70–99)

## 2020-05-27 NOTE — Progress Notes (Signed)
Physical Therapy Session Note  Patient Details  Name: Johnathan Murray MRN: [id number] Date of Birth: [birth year]  Today's Date: 05/27/2020 PT Individual Time: 1000-1025 and 2993-7169 PT Individual Time Calculation (min): 25 min 73 min  Short Term Goals: Week 1:  PT Short Term Goal 1 (Week 1): pt will perform supine to sit w/cga and adhere to precautions PT Short Term Goal 1 - Progress (Week 1): Met PT Short Term Goal 2 (Week 1): Pt will perform STS w/min assist PT Short Term Goal 2 - Progress (Week 1): Progressing toward goal PT Short Term Goal 3 (Week 1): pt will perform bed to chair w/min assist PT Short Term Goal 3 - Progress (Week 1): Met PT Short Term Goal 4 (Week 1): Pt will ambulate 53f w/LRAD and mod assist of 1 PT Short Term Goal 4 - Progress (Week 1): Progressing toward goal Week 2:  PT Short Term Goal 1 (Week 2): pt will transfer sit<>stand with LRAD min A overall PT Short Term Goal 2 (Week 2): Pt will ambulate 56fwith LRAD mod A of 1 PT Short Term Goal 3 (Week 2): Pt will perform simulated car transfer with LRAD min A  Skilled Therapeutic Interventions/Progress Updates:   Treatment Session 1: 1000-1025 25 min Received pt supine in bed, pt agreeable to therapy, and did not state pain level but reported increased low back discomfort with mobility. Repositioning, rest breaks, and distraction done to reduce pain levels. Session with emphasis on functional mobility/transfers, generalized strengthening, dynamic standing balance/coordination, ambulation, and improved activity tolerance. Pt transferred supine<>sitting EOB with HOB elevated and supervision and donned LSO sitting EOB with total A and shoes with min A. Pt requested to try sitting in chair in room to test comfort level. Pt transferred bed<>chair stand<>pivot with RW min A. Pt reported that the chair was comfortable and stated he would like to try sitting in it later today. Cardiology present for brief assessment and  [facility]. Pt ambulated 3853fith RW min A with pt's wife providing WC follow. Pt required cues for upright posture, but stopped due to increased "catching" sensation in low back that decreased with rest. [facility] and transferred stand<>pivot WC<>bed with RW min A, doffed shoes sitting EOB with supervision, and transferred sit<>supine mod A for LE management. Concluded session with pt supine in bed, needs within reach, and no bed alarm on but pt's wife present at bedside.   Treatment Session 2: [id number] 17n Received pt supine in bed, pt agreeable to therapy, and reported pain 4/10 in low back and increased discomfort in R knee. RN notified and administered pain medication during session. Repositioning, rest breaks, and distraction done to reduce pain levels. Session with emphasis on functional mobility/transfers, generalized strengthening, dynamic standing balance/coordination, ambulation, stair navigation, and improved activity tolerance. Pt's wife present during session. Pt transferred supine<>sitting EOB with HOB elevated and supervision and donned LSO sitting EOB with total A. Pt transferred bed<>WC stand<>pivot with RW min A and [facility] therapy gym [facility] for time management purposes. Pt navigated 2 3in steps with 2 rails mod A ascending and descending with a step to pattern and descending backwards. Pt required cues for stepping sequence and technique and to avoid putting too much pressure through bilateral UEs. Pt ambulated 53f75fth RW min A with cues for upright posture and bilateral knee extension. Pt transported to dayroom in  WC total A and performed the following exercises seated in WC with supervision, increased time, and verbal/visual cues for technique:  -knee extension x10 bilaterally -hip flexion x10 bilaterally -heel raises 2x10 -hamstring curls with orange TB 1x6 and 1x10 on R LE and  2x10 on L LE -hip abd/add x10 Pt transported back to room in [facility] total A and transferred stand<>pivot WC<>bed with RW mod A with cues for anterior weight shift and scooting forwards. Doffed shoes with supervision and LSO total A and transferred sit<>supine mod A for LE management. Concluded session with pt supine in bed, needs within reach, and no bed alarm on but pt's wife present at bedside.   Therapy Documentation Precautions:  Precautions Precautions: Fall Precaution Comments: freq cues to adhere to sternal prec Required Braces or Orthoses: Spinal Brace Spinal Brace: Lumbar corset Other Brace: for comfort Restrictions Weight Bearing Restrictions: No Other Position/Activity Restrictions: sternal precautions  Therapy/Group: Individual Therapy Alfonse Alpers PT, DPT   05/27/2020, 7:31 AM

## 2020-05-27 NOTE — Progress Notes (Signed)
Occupational Therapy Session Note  Patient Details  Name: Johnathan Murray MRN: [id number] Date of Birth: 12/26/1950  Today's Date: 05/27/2020 OT Individual Time: 8250-5397 OT Individual Time Calculation (min): 59 min    Short Term Goals: Week 2:  OT Short Term Goal 1 (Week 2): Pt will complete toileting tasks with mod assist at sit > stand level OT Short Term Goal 2 (Week 2): Pt will complete LB dressing at sit > stand level with mod assist OT Short Term Goal 3 (Week 2): Pt will complete bathing at shower level with min assist  Skilled Therapeutic Interventions/Progress Updates:    Treatment session with focus on sit > stand, LB dressing, and functional transfers to simulate home setup.  Pt received semi-reclined in bed already dressed.  Pt reports wife assisting with placing pants over feet as he did not have a reacher present, but then he was able to pull them over his legs and hips.  Educated on plan to engage in LB dressing and shower during future therapy session with pt agreeable.  Pt donned shoes seated EOB with assist to adjust over heels - would benefit from long shoe horn.  Pt's wife reports their bed is 32" high and guest bed 22".  Set hospital bed to both heights, pt reports that 32" would be too high at this point and would need a step but then would still need assist to bring legs in bed.  Pt able to complete sit > stand from 22" with min assist to lift in to standing to decrease pushing up through BUE due to sternal precautions.  Completed stand pivot transfers throughout session min assist with RW.  Engaged in functional mobility in ADL apt with focus on increased distance to access bathroom and complete toilet transfers.  Pt ambulated 20' x2 with RW with CGA. Pt preferring elongated toilet seat, therefore provided handout with options of BSC vs elevated toilet seat for use at home.  Discussed tub/shower vs walk-in shower transfers, therapist demonstrating tub transfer bench in  shower with plan to practice during future session.  Continue to problem solve DME for bathroom as well as preferred shower setup and bed setup.  Returned to room and completed transfer back to bed with mod assist to lift BLE in to bed due to pain in lower back.  Therapy Documentation Precautions:  Precautions Precautions: Fall Precaution Comments: freq cues to adhere to sternal prec Required Braces or Orthoses: Spinal Brace Spinal Brace: Lumbar corset Other Brace: for comfort Restrictions Weight Bearing Restrictions: No Other Position/Activity Restrictions: sternal precautions Pain:  Pt reports pain 5/10 in lower back.  Premedicated prior to session.   Therapy/Group: Individual Therapy  Simonne Come 05/27/2020, 11:11 AM

## 2020-05-27 NOTE — Progress Notes (Signed)
Patient ID: Johnathan Murray, male   DOB: 22-Apr-1951, 69 y.o.   MRN: [id number] Team Conference Report to Patient/Family  Team Conference discussion was reviewed with the patient and caregiver, including goals, any changes in plan of care and target discharge date.  Patient and caregiver express understanding and are in agreement.  The patient has a target discharge date of 06/04/20.  [name] 05/27/2020, 1:56 PM

## 2020-05-27 NOTE — Progress Notes (Signed)
Patient ID: Johnathan Murray, male   DOB: 1951/01/27, 69 y.o.   MRN: [id number]   Ramp options provided

## 2020-05-27 NOTE — Progress Notes (Signed)
Pt MEWS turned Red due to HR. Pt has Dx of Afib. No signs of distress noted at this time. Pt denies being in pain. All other vs WNL. HR rechecked- 80. Charge RN [employer] notified.

## 2020-05-27 NOTE — Patient Care Conference (Signed)
Inpatient RehabilitationTeam Conference and Plan of Care Update Date: 05/27/2020   Time: 1:58 PM    Patient Name: Johnathan Murray      Medical Record Number: [id number]  Date of Birth: [birth year] Sex: Male         Room/Bed: 4W10C/4W10C-01 Payor Info: Payor: [place] / Plan: BCBS COMM PPO / Product Type: *No Product type* /    Admit Date/Time:  05/14/2020  3:19 PM  Primary Diagnosis:  [place] [facility] Problems: Principal Problem:   Debility Active Problems:   Diabetes mellitus due to underlying condition with unspecified complications ([place])   PAF (paroxysmal atrial fibrillation) ([place])   S/P AVR   Bacteremia due to Enterococcus   Septic arthritis of knee, bilateral (HCC)   Moderate protein-calorie malnutrition (HCC)   Discitis   Pressure injury of skin   Postoperative pain   Transaminitis   Hypoalbuminemia due to protein-calorie malnutrition ([place])   Drug induced constipation   Controlled type 2 diabetes mellitus with hyperglycemia, with long-term current use of insulin (HCC)   AKI (acute kidney injury) ([place])   History of prolonged Q-T interval on ECG   Peripheral edema   Labile blood glucose   Atrial fibrillation with RVR ([place])   Anasarca   Hypokalemia   Hypotension due to hypovolemia    Expected Discharge Date: Expected Discharge Date: 06/04/20  Team Members Present: Physician leading conference: Dr. Delice Lesch Care Coodinator Present: Dorien Chihuahua, RN, BSN, CRRN;Christina Sampson Goon, [place] Nurse Present: Other (comment) Ludwig Clarks, RN) PT Present: Becky Sax, PT OT Present: Simonne Come, OT SLP Present: Jettie Booze, CF-SLP PPS Coordinator present : Gunnar Fusi, Novella Olive, PT     Current Status/Progress Goal Weekly Team Focus  Bowel/Bladder             Swallow/Nutrition/ Hydration             ADL's   Min-mod sit > stand depending on height of surface, Mod A LB dressing, Min A transfers and ambulation  Supervision, CGA for any  standing or sit > stand  ADL retraining, transfer training, endurance, pain management, pt/family education   Mobility   bed mobility mod A, sit<>stand and stand<>pivot with RW min A from elevated surfaces, gait 72ft with RW min A +2  CGA, mod A for steps  functional mobility/transfers, generalized strengthening, dynamic standing balance/coordination, ambulation, stair navigation, and endurance   Communication             Safety/Cognition/ Behavioral Observations            Pain             Skin               Team Discussion:  Discharge Planning/Teaching Needs:  Patient plans to discharge home with spouse (spouse cannot do heavy lifting, works 6:30-3PM)  Will schedule with spouse if required   Current Update:    Current Barriers to Discharge:  Home enviroment access/layout   Possible Resolutions to Barriers: W/C will not fit through door; patient able to ambulate short distances to enter/exit doorways to get into home and then use wheelchair. Able to complete 2 steps with max assist and will need to complete 8 steps to entry with (1) rail for discharge.   Patient on target to meet rehab goals: yes, self limiting as he has wife do activities that he can do.   *See Care Plan and progress notes for long and short-term goals.   Revisions  to Treatment Plan:  Therapy to practice self care with adaptive equipment.    Medical Summary  Current Status: Decreased functional mobility secondary to sepsis/Enterococcus faecalis bacteremia/discitis/osteomyelitis of L4-5 status post aortic root replacement 04/17/2020 status post cardioversion for atrial fibrillation Weekly Focus/Goal: Improve mobility, AKI, pain, CBGs, K+  Barriers to Discharge: Medical stability;Wound care   Possible Resolutions to Barriers: Therapies, optimize pain meds, follow HR, optimize DM meds, follow labs, cards recs, supplement K+   Continued Need for Acute Rehabilitation Level of Care: The patient requires  daily medical management by a physician with specialized training in physical medicine and rehabilitation for the following reasons: Direction of a multidisciplinary physical rehabilitation program to maximize functional independence : Yes Medical management of patient stability for increased activity during participation in an intensive rehabilitation regime.: Yes Analysis of laboratory values and/or radiology reports with any subsequent need for medication adjustment and/or medical intervention. : Yes   I attest that I was present, lead the team conference, and concur with the assessment and plan of the team.   Dorien Chihuahua B 05/27/2020, 1:58 PM

## 2020-05-27 NOTE — Progress Notes (Signed)
Physical Therapy Session Note  Patient Details  Name: Johnathan Murray MRN: [id number] Date of Birth: [birth year]  Today's Date: 05/27/2020 PT Individual Time: 1118-1200 PT Individual Time Calculation (min): 42 min   Short Term Goals: Week 2:  PT Short Term Goal 1 (Week 2): pt will transfer sit<>stand with LRAD min A overall PT Short Term Goal 2 (Week 2): Pt will ambulate 75ft with LRAD mod A of 1 PT Short Term Goal 3 (Week 2): Pt will perform simulated car transfer with LRAD min A  Skilled Therapeutic Interventions/Progress Updates:     Pt received supine in bed and agreeable to therapy. Reports pain in low back. Number not provided. PT provides bracing and rest breaks as needed to manage pain symptoms. Supine to sit and stand pivot transfer to [facility] with minA and use of RW. WC transport to gym for time management. Wife attends for education on mobility training.  Pt performs x3 steps with BHRs and minA from. Ascending with LLE and descending with RLE. Pt very forward flexed but no buckling in LEs and not LOBs. After extended seated rest break, pt attempts stairs holding onto RHR with BUEs to mimic home setup. Pt completes x1 steps with modA and reports inncrease in back pain using this technique.  Pt ambulates 53' and 36' with minA +1 and +2 WC follow from wife. PT cues for upright gaze and increased gait speed, though pt reports he cannot increase speed due to RLE feeling like it will give way.  Stand pivot back to bed with minA and sit to supine with minA management of BLEs. Pt left supine in bed with alarm intact and all needs within reach.  Therapy Documentation Precautions:  Precautions Precautions: Fall Precaution Comments: freq cues to adhere to sternal prec Required Braces or Orthoses: Spinal Brace Spinal Brace: Lumbar corset Other Brace: for comfort Restrictions Weight Bearing Restrictions: No Other Position/Activity Restrictions: sternal precautions    Therapy/Group:  Individual Therapy  Breck Coons, PT, DPT 05/27/2020, 3:47 PM

## 2020-05-27 NOTE — Progress Notes (Signed)
Progress Note  Patient Name: Johnathan Murray Date of Encounter: 05/27/2020  [facility] Cardiologist: Jenean Lindau, MD   Subjective   Undergoing physical therapy.  Excellent.  No chest pain no shortness of breath. Spoke with he and wife  Inpatient Medications    Scheduled Meds: . amiodarone  200 mg Oral Daily  . amoxicillin  500 mg Oral Q12H  . apixaban  5 mg Oral BID  . feeding supplement (ENSURE ENLIVE)  237 mL Oral TID BM  . feeding supplement (PRO-STAT SUGAR FREE 64)  30 mL Oral BID  . ferrous [id number] C-folic acid  1 capsule Oral Q breakfast  . ferrous sulfate  325 mg Oral BID WC  . furosemide  60 mg Oral Daily  . hydrocortisone cream   Topical TID  . insulin aspart  0-20 Units Subcutaneous TID WC  . insulin glargine  8 Units Subcutaneous BID  . levothyroxine  25 mcg Oral QAC breakfast  . lidocaine  1 patch Transdermal Q24H  . metoprolol tartrate  12.5 mg Oral BID  . oxyCODONE  10 mg Oral Q12H  . polyethylene glycol  17 g Oral Daily  . potassium chloride  20 mEq Oral BID  . pregabalin  100 mg Oral TID  . senna  1 tablet Oral QHS   Continuous Infusions:  PRN Meds: acetaminophen **OR** acetaminophen, albuterol, cyclobenzaprine, oxyCODONE, sodium chloride flush, sorbitol   Vital Signs    Vitals:   05/26/20 1954 05/26/20 1955 05/27/20 0500 05/27/20 0514  BP: 92/69 97/65  92/72  Pulse: (!) 125 79  81  Resp:  16    Temp: 98.1 F (36.7 C)   (!) 97.4 F (36.3 C)  TempSrc:      SpO2: 99% 100%  100%  Weight:   85.4 kg   Height:        Intake/Output Summary (Last 24 hours) at 05/27/2020 1015 Last data filed at 05/27/2020 0830 Gross per 24 hour  Intake 600 ml  Output 2375 ml  Net -1775 ml   Last 3 Weights 05/27/2020 05/26/2020 05/25/2020  Weight (lbs) 188 lb 4.4 oz 187 lb 9.8 oz 190 lb 4.1 oz  Weight (kg) 85.4 kg 85.1 kg 86.3 kg      Physical Exam   GEN: No acute distress.   Neck: No JVD Cardiac: RRR, no murmurs, rubs, or gallops.    Respiratory: Clear to auscultation bilaterally. GI: Soft, nontender, non-distended  MS: Mild LE edema; No deformity. Staples removed Neuro:  Nonfocal  Psych: Normal affect   Labs    High Sensitivity Troponin:  No results for input(s): TROPONINIHS in the last 720 hours.    Chemistry Recent Labs  Lab 05/25/20 0717 05/26/20 0554 05/27/20 0550  NA 139 137 137  K 4.0 4.2 4.2  CL 101 100 100  CO2 28 27 28   GLUCOSE 100* 110* 117*  BUN 29* 28* 30*  CREATININE 1.44* 1.51* 1.44*  CALCIUM 9.2 9.1 9.2  GFRNONAA 50* 47* 50*  GFRAA 57* 54* 57*  ANIONGAP 10 10 9      Hematology Recent Labs  Lab 05/25/20 0717 05/26/20 0554 05/27/20 0550  WBC 6.3 6.1 5.8  RBC 3.19* 3.06* 3.14*  HGB 8.9* 8.3* 8.5*  HCT 29.3* 27.9* 28.9*  MCV 91.8 91.2 92.0  MCH 27.9 27.1 27.1  MCHC 30.4 29.7* 29.4*  RDW 17.2* 17.3* 17.4*  PLT 371 326 323    BNPNo results for input(s): BNP, PROBNP in the last 168 hours.  DDimer No results for input(s): DDIMER in the last 168 hours.   Radiology    No results found.  Cardiac Studies   EF normal  Patient Profile     69 y.o. 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 now s/p debridement of aortic root abscess and aortic root replacement on 04/16/2020, paroxysmal atrial fibrillation on Eliquis,recurrent bilateral knee effusionsand septic arthritis with arthrocentesis (6/10),hypertension, dyslipidemia, diabetes on insulin, hypothyroidism, lumbar stenosis with neurogenic claudication, and cervical decompression in 3/2021and a fib in rehab and back to main hospital at [facility] for amiodarone and thought to repeat DCCV in future if a fib persists and infection improves (this was written 02/05/20)now with volume overload.  Assessment & Plan    Persistent atrial fibrillation -Failed  cardioversion in the setting of infection. -Continuing with amiodarone with consideration in future of repeat cardioversion.  Nonetheless, it is helping with rate control especially in the setting of hypotension. -Low-dose metoprolol.  Diastolic heart failure -Continue with Lasix 60 mg a day.  This is slightly increased from before.  Protein. Creat stable.   Chronic anticoagulation -Eliquis.  Has had difficulty with bleeding requiring transfusions previously.  Monitoring. HGB stable  Recurrent septic knee/osteomyelitis of L4-L5/aortic root abscess -Antibiotics PICC line.  Aortic valve replacement -Currently stable.  Has had endocarditis.  Dental prophylaxis.  CAD post CABG -Stable no angina.  2 out of 3 grafts are occluded.       For questions or updates, please contact [place] Please consult [email or web address] for contact info under        Signed, Candee Furbish, MD  05/27/2020, 10:15 AM

## 2020-05-27 NOTE — Progress Notes (Signed)
Johnathan Murray PHYSICAL MEDICINE & REHABILITATION PROGRESS NOTE  Subjective/Complaints: Patient seen sitting up in bed this morning.  Wife at bedside.  He states he slept well overnight.  He notes improvement in strength, but gets quite fatigued.  He was seen by cardiology yesterday, notes reviewed-labs ordered.  ROS: Denies shortness of breath, CP, N/V/D  Objective: Vital Signs: Blood pressure 92/72, pulse 81, temperature (!) 97.4 F (36.3 C), resp. rate 16, height 5\' 9"  (1.753 m), weight 85.4 kg, SpO2 100 %. No results found. Recent Labs    05/26/20 0554 05/27/20 0550  WBC 6.1 5.8  HGB 8.3* 8.5*  HCT 27.9* 28.9*  PLT 326 323   Recent Labs    05/26/20 0554 05/27/20 0550  NA 137 137  K 4.2 4.2  CL 100 100  CO2 27 28  GLUCOSE 110* 117*  BUN 28* 30*  CREATININE 1.51* 1.44*  CALCIUM 9.1 9.2    Physical Exam: BP 92/72 (BP Location: Right Arm)   Pulse 81   Temp (!) 97.4 F (36.3 C)   Resp 16   Ht 5\' 9"  (1.753 m)   Wt 85.4 kg   SpO2 100%   BMI 27.80 kg/m  Constitutional: No distress . Vital signs reviewed. HENT: Normocephalic.  Atraumatic. Eyes: EOMI. No discharge. Cardiovascular: No JVD.  Irregularly irregular. Respiratory: Normal effort.  No stridor.  Bilaterally clear to auscultation. GI: Non-distended.  BS +. Skin: Bilateral knees healing incisions Psych: Normal mood.  Normal behavior. Musc: Bilateral knees with edema and tenderness, improving Neuro: Alert Motor:  Right lower extremity: Hip flexion, knee extension 3 +/5, ankle dorsiflexion 4+/5 Left lower extremity: Hip flexion, knee extension 3+/5, ankle dorsiflexion 4+/5  Assessment/Plan: 1. Functional deficits secondary to debility due to septic knees, disciitis which require 3+ hours per day of interdisciplinary therapy in a comprehensive inpatient rehab setting.  Physiatrist is providing close team supervision and 24 hour management of active medical problems listed below.  Physiatrist and rehab team  continue to assess barriers to discharge/monitor patient progress toward functional and medical goals  Care Tool:  Bathing  Bathing activity did not occur: Refused Body parts bathed by patient: Right arm, Left arm, Chest, Abdomen, Right upper leg, Left upper leg, Face, Front perineal area   Body parts bathed by helper: Right lower leg, Left lower leg     Bathing assist Assist Level: Minimal Assistance - Patient > 75%     Upper Body Dressing/Undressing Upper body dressing   What is the patient wearing?: Pull over shirt, Orthosis    Upper body assist Assist Level: Minimal Assistance - Patient > 75%    Lower Body Dressing/Undressing Lower body dressing      What is the patient wearing?: Pants     Lower body assist Assist for lower body dressing: Moderate Assistance - Patient 50 - 74%     Toileting Toileting Toileting Activity did not occur Landscape architect and hygiene only): N/A (no void or bm)  Toileting assist Assist for toileting: Maximal Assistance - Patient 25 - 49%     Transfers Chair/bed transfer  Transfers assist     Chair/bed transfer assist level: Minimal Assistance - Patient > 75%     Locomotion Ambulation   Ambulation assist      Assist level: Minimal Assistance - Patient > 75% Assistive device: Walker-rolling Max distance: 71ft   Walk 10 feet activity   Assist     Assist level: Minimal Assistance - Patient > 75% Assistive device: Walker-rolling  Walk 50 feet activity   Assist Walk 50 feet with 2 turns activity did not occur: Safety/medical concerns         Walk 150 feet activity   Assist Walk 150 feet activity did not occur: Safety/medical concerns         Walk 10 feet on uneven surface  activity   Assist Walk 10 feet on uneven surfaces activity did not occur: Safety/medical concerns         Wheelchair     Assist Will patient use wheelchair at discharge?: Yes Type of Wheelchair: Manual    Wheelchair  assist level: Supervision/Verbal cueing Max wheelchair distance: 62ft    Wheelchair 50 feet with 2 turns activity    Assist            Wheelchair 150 feet activity     Assist            Medical Problem List and Plan: 1.  Decreased functional mobility secondary to sepsis/Enterococcus faecalis bacteremia/discitis/osteomyelitis of L4-5 status post aortic root replacement 04/17/2020 status post cardioversion for atrial fibrillation.  Sternal precautions/lumbar corset for comfort x 4 more weeks  Continue CIR  Team conference today to discuss current and goals and coordination of care, home and environmental barriers, and discharge planning with nursing, case manager, and therapies.  2.  Antithrombotics: -DVT/anticoagulation: Resumed Eliquis 05/15/2020             -antiplatelet therapy: N/A 3. Pain Management: OxyContin sustained release 10 mg every 12 hours, Lyrica 100 mg 3 times daily Flexeril as as well as oxycodone needed  Relatively controlled on 7/7  Lidoderm patch ordered on 6/30, with improvement 4. Mood: N/A meds- follow for ego support             -antipsychotic agents: N/A 5. Neuropsych: This patient is capable of making decisions on his own behalf. 6. Skin/Wound Care: Routine skin checks 7. Fluids/Electrolytes/Nutrition: Routine in and outs. 8.  ID/Enterococcus bacteremia.  IV ampicillin 2 g every 4 hours as well as Rocephin 2 g every 12 hours completed on 6/30.  Follow-up infectious disease 9.  Acute on chronic anemia.  Continue iron supplement.    Hb 8.5 on 7/7  Cont to monitor 10.  Bilateral septic knees.  Status post total synovectomy irrigation debridement bilateral knees 05/02/2020 per Dr. Sharol Given.  Weightbearing as tolerated.   Continue ice to bilateral knees  11.  AKI.     Creatinine 1.44 on 7/7  Secondary to diuresis 12.  PAF.  Follow-up cardiology services.  Amiodarone as directed.    Continue Lopressor 12.5 mg twice daily.    Monitor with increased  exertion  Repeat ECG reviewed, showing a flutter with prolonged QTC  Recommendations per cardiology 13.  Diabetes mellitus with hyperglycemia.  Latest hemoglobin A1c 6.8.    Lantus insulin 10 units twice daily, decreased to 8 units on 7/6.   CBG (last 3)  Recent Labs    05/26/20 1650 05/26/20 2119 05/27/20 0610  GLUCAP 132* 87 109*    Relatively controlled on 7/7  Monitor with increased mobility 14.  Recent C3-4 ACDF 03/02/2020 per [name] for cervical myelopathy.  Cervical collar discontinued 15.  Hypothyroidism.  Synthroid 16. Drug-induced constipation.  MiraLAX daily, Senokot nightly  Adjust bowel meds as necessary 17.  Severe hypoalbuminemia  Supplement initiated on 6/25  IV supplementation x3 completed 18.  Transaminitis: Resolved  LFTs within normal limits on 6/29 19.  Peripheral edema  See #12, #19  IV Lasix  and IV albumin per cardiology, additional dose given on 6/29, transitioned to p.o. Lasix on 6/30, decreased on 7/6  Echo reviewed from 6/9-showing normal EF  BNP elevated  Filed Weights   05/25/20 0525 05/26/20 0506 05/27/20 0500  Weight: 86.3 kg 85.1 kg 85.4 kg   Stable on 7/7 20.  Prolonged QTC  Monitor in conjunction with other medications  Appreciate cards recs 21.  Hypothyroidism  TSH elevated on 6/29 22.  Borderline soft blood pressures   Vitals:   05/26/20 1955 05/27/20 0514  BP: 97/65 92/72  Pulse: 79 81  Resp: 16   Temp:  (!) 97.4 F (36.3 C)  SpO2: 100% 100%   Secondary to diuresis, asymptomatic on 7/7 23.  Hypokalemia-secondary to diuretics  Potassium 4.2 on 7/7  Supplement increased on 7/1  LOS: 13 days A FACE TO FACE EVALUATION WAS PERFORMED  Abdulloh Ullom Lorie Phenix 05/27/2020, 8:25 AM

## 2020-05-28 ENCOUNTER — Inpatient Hospital Stay (HOSPITAL_COMMUNITY): Payer: BC Managed Care – PPO

## 2020-05-28 LAB — CBC WITH DIFFERENTIAL/PLATELET
Abs Immature Granulocytes: 0 10*3/uL (ref 0.00–0.07)
Basophils Absolute: 0.1 10*3/uL (ref 0.0–0.1)
Basophils Relative: 1 %
Eosinophils Absolute: 0.4 10*3/uL (ref 0.0–0.5)
Eosinophils Relative: 6 %
HCT: 27.9 % — ABNORMAL LOW (ref 39.0–52.0)
Hemoglobin: 8.4 g/dL — ABNORMAL LOW (ref 13.0–17.0)
Lymphocytes Relative: 10 %
Lymphs Abs: 0.6 10*3/uL — ABNORMAL LOW (ref 0.7–4.0)
MCH: 27.6 pg (ref 26.0–34.0)
MCHC: 30.1 g/dL (ref 30.0–36.0)
MCV: 91.8 fL (ref 80.0–100.0)
Monocytes Absolute: 0.2 10*3/uL (ref 0.1–1.0)
Monocytes Relative: 3 %
Neutro Abs: 5 10*3/uL (ref 1.7–7.7)
Neutrophils Relative %: 80 %
Platelets: 327 10*3/uL (ref 150–400)
RBC: 3.04 MIL/uL — ABNORMAL LOW (ref 4.22–5.81)
RDW: 17.4 % — ABNORMAL HIGH (ref 11.5–15.5)
WBC: 6.3 10*3/uL (ref 4.0–10.5)
nRBC: 0 % (ref 0.0–0.2)
nRBC: 0 /100 WBC

## 2020-05-28 LAB — GLUCOSE, CAPILLARY
Glucose-Capillary: 147 mg/dL — ABNORMAL HIGH (ref 70–99)
Glucose-Capillary: 150 mg/dL — ABNORMAL HIGH (ref 70–99)
Glucose-Capillary: 54 mg/dL — ABNORMAL LOW (ref 70–99)
Glucose-Capillary: 92 mg/dL (ref 70–99)
Glucose-Capillary: 158 mg/dL — ABNORMAL HIGH (ref 70–99)

## 2020-05-28 NOTE — Progress Notes (Signed)
Physical Therapy Session Note  Patient Details  Name: Johnathan Murray MRN: [id number] Date of Birth: Mar 12, 1951  Today's Date: 05/28/2020 PT Individual Time: 1134-1200 PT Individual Time Calculation (min): 26 min   Short Term Goals: Week 2:  PT Short Term Goal 1 (Week 2): pt will transfer sit<>stand with LRAD min A overall PT Short Term Goal 1 - Progress (Week 2): Met PT Short Term Goal 2 (Week 2): Pt will ambulate 21f with LRAD mod A of 1 PT Short Term Goal 2 - Progress (Week 2): Progressing toward goal PT Short Term Goal 3 (Week 2): Pt will perform simulated car transfer with LRAD min A PT Short Term Goal 3 - Progress (Week 2): Progressing toward goal  Skilled Therapeutic Interventions/Progress Updates:     Pt received supine in bed and agreeable to therapy. Reports low back pain, number not provided. PT provides education and rest breaks as needed to manage pain symptoms. Pt requests to not use LSO this session as he feels it has been contributing to his back pain and pressing against his incision.   Supine to sit with supervision and verbal cues for safety. Sit to stand and [facility]. Pt participate sin gait training in // bars with focus on side stepping and posture. Facing mirror for visual feedback pt takes sidesteps R and L with CGA at hips for stability and verbal cues for technique. PT also cues extensively on hand placement for sit<>stand with pt tending to prefer "his way", despite poor adherence to spinal precautions. Pt ambulates totals of x20 x3 reps.   Pt uses RW for ambulation in room including sidesteps with CGA, x10'. Sit to supine with minA management of BLEs. Left supine in bed with all needs within reach.  Therapy Documentation Precautions:  Precautions Precautions: Fall Precaution Comments: freq cues to adhere to sternal prec Required Braces or Orthoses: Spinal Brace Spinal Brace: Lumbar corset Other Brace: for  comfort Restrictions Weight Bearing Restrictions: No Other Position/Activity Restrictions: sternal precautions    Therapy/Group: Individual Therapy  WBreck Coons PT, DPT 05/28/2020, 3:44 PM

## 2020-05-28 NOTE — Progress Notes (Signed)
Pt MEWs turned yellow due  to HR. Pt had hx of afib. Pt denies pain, no signs of distress noted. Charge Rn [employer] notified.

## 2020-05-28 NOTE — Progress Notes (Signed)
Physical Therapy Weekly Progress Note  Patient Details  Name: Johnathan Murray MRN: [id number] Date of Birth: 22-Apr-1951  Beginning of progress report period: May 15, 2020 End of progress report period: May 28, 2020  Patient has met 1 of 3 short term goals. Pt demonstrates gradual progress towards long term goals. Pt currently requires supervision for rolling L and R in bed and to transfer supine<>sitting EOB but min/mod A to transfer sit<>supine for LE management. Pt able to transfer sit<>stand and stand<>pivot with RW min A, able to ambulate 61f with RW min A, and navigate 3 steps with 2 rails mod A. Pt continues to be limited by low back pain, decreased bilateral knee ROM, decreased endurance, and difficulty maintaining sternal precautions. Pt's biggest barrier continues to be the 8 steps with 1 rail that he has to enter his home.   Patient continues to demonstrate the following deficits muscle weakness and muscle joint tightness, decreased cardiorespiratoy endurance and decreased standing balance, decreased postural control, decreased balance strategies and difficulty maintaining precautions and therefore will continue to benefit from skilled PT intervention to increase functional independence with mobility.  Patient progressing toward long term goals..  Continue plan of care.  PT Short Term Goals Week 2:  PT Short Term Goal 1 (Week 2): pt will transfer sit<>stand with LRAD min A overall PT Short Term Goal 1 - Progress (Week 2): Met PT Short Term Goal 2 (Week 2): Pt will ambulate 529fwith LRAD mod A of 1 PT Short Term Goal 2 - Progress (Week 2): Progressing toward goal PT Short Term Goal 3 (Week 2): Pt will perform simulated car transfer with LRAD min A PT Short Term Goal 3 - Progress (Week 2): Progressing toward goal Week 3:  PT Short Term Goal 1 (Week 3): STG=LTG due to LOS  Skilled Therapeutic Interventions/Progress Updates:  Ambulation/gait training;Community  reintegration;DME/adaptive equipment instruction;Neuromuscular re-education;Stair training;UE/LE Strength taining/ROM;Wheelchair propulsion/positioning;Balance/vestibular training;Discharge planning;Functional electrical stimulation;Pain management;Therapeutic Activities;UE/LE Coordination activities;Cognitive remediation/compensation;Disease management/prevention;Functional mobility training;Patient/family education;Therapeutic Exercise   Therapy Documentation Precautions:  Precautions Precautions: Fall Precaution Comments: freq cues to adhere to sternal prec Required Braces or Orthoses: Spinal Brace Spinal Brace: Lumbar corset Other Brace: for comfort Restrictions Weight Bearing Restrictions: No Other Position/Activity Restrictions: sternal precautions  Therapy/Group: Individual Therapy AnAlfonse AlpersT, DPT   05/28/2020, 7:28 AM

## 2020-05-28 NOTE — Progress Notes (Signed)
Physical Therapy Session Note  Patient Details  Name: Johnathan Murray MRN: [id number] Date of Birth: November 29, 1950  Today's Date: 05/28/2020 PT Individual Time: [phone] and [id number] PT Individual Time Calculation (min): 53 min and 43 min  Short Term Goals: Week 2:  PT Short Term Goal 1 (Week 2): pt will transfer sit<>stand with LRAD min A overall PT Short Term Goal 1 - Progress (Week 2): Met PT Short Term Goal 2 (Week 2): Pt will ambulate 42f with LRAD mod A of 1 PT Short Term Goal 2 - Progress (Week 2): Progressing toward goal PT Short Term Goal 3 (Week 2): Pt will perform simulated car transfer with LRAD min A PT Short Term Goal 3 - Progress (Week 2): Progressing toward goal Week 3:  PT Short Term Goal 1 (Week 3): STG=LTG due to LOS  Skilled Therapeutic Interventions/Progress Updates:  Ambulation/gait training;Community reintegration;DME/adaptive equipment instruction;Neuromuscular re-education;Stair training;UE/LE Strength taining/ROM;Wheelchair propulsion/positioning;Balance/vestibular training;Discharge planning;Functional electrical stimulation;Pain management;Therapeutic Activities;UE/LE Coordination activities;Cognitive remediation/compensation;Disease management/prevention;Functional mobility training;Patient/family education;Therapeutic Exercise   Today's Interventions: Treatment Session 1: [id number] min Received pt supine in bed, pt agreeable to therapy, and denied any pain at start of session but reported pain 5/10 in low back at end of session. Repositioning, rest breaks, and distraction done to reduce pain levels. Session with emphasis on functional mobility/transfers, generalized strengthening, dynamic standing balance/coordination, stair navigation, and improved activity tolerance. Pt transferred supine<>sitting EOB with supervision with HOB elevated, donned shoes with min A, and LSO total A. Pt transferred stand<>pivot bed<>WC with RW min A and [facility] therapy gym  [facility] for [facility] purposes. Pt navigated 2 steps with mod A using a lateral stepping technique and 1 rail. Pt with increased weight bearing onto bilateral elbows and severe trunk flexion. No buckling or LOB noted but pt reported R LE feeling like it was going to "give out". Noted pt with skin tear on R elbow from increased weight bearing on elbow and RN notified and NT present to clean/dress wound. Pt extremely discouraged after stairs and therapist suggested/demonstrated shower chair technique for navigating steps and pt agreed to attempt. Pt transferred stand<>pivot WC<>shower chair with RW min A however pt unable to bend knees to 90 degrees while sitting in shower chair for set up to stand due to decreased bilateral knee flexion ROM. Pt also reported increased low back pain from positioning. Pt transferred stand<>pivot shower chair<>WC stand<>pivot with RW min A. Pt emotional and reporting increased pain and requested to return to bed. Therapist provided emotional support and encouragement and pt [facility] and transferred stand<>pivot WC<>bed with RW min A from pt's wife, doffed shoes with supervision, and transferred sit<>supine min A from pt's wife for LE management. Concluded session with pt supine in bed, needs within reach, and no bed alarm on but pt's wife present at bedside. Therapist provided fresh drink for pt and cleared pt's wife BRise Paganinito assist with transfers with staff present in room.   Treatment Session 2: 1445-1528 43 min Received pt supine in bed, pt agreeable to therapy, and denied any pain initially but reported increased low back pain with mobility. Repositioning, rest breaks, and distraction done to reduce pain levels. Session with emphasis on functional mobility/transfers, generalized strengthening, dynamic standing balance/coordination, ambulation, and improved activity tolerance. Pt transferred supine<>sitting EOB with supervision with HOB  elevated and donned shoes with min A. Pt transferred stand<>pivot bed<>WC with RW min A and [facility] purposes. Pt ambulated 69f with RW min A with cues for upright posture. Pt performed bilateral LE strengthening on Nustep at workload 1 for 2 minutes x 1 trial and 3 minutes x 1 trial with supervision and rest breaks in between with emphasis on cardiovascular endurance. Pt transferred Nustep<>WC stand<>pivot with RW CGA and transferred sit<>stand at table in dayroom min A with cues for hand placement. While standing, pt performed heel raises 2x10 with bilateral UE support and CGA. Pt reported 7/10 fatigue after activity and [facility] and transferred stand<>pivot WC<>bed with RW min A from pt's wife, doffed shoes with supervision, and transferred sit<>supine mod A for LE management. Concluded session with pt supine in bed, needs within reach, and no bed alarm on but pt's wife present at bedside. Therapist provided fresh drink for pt.   Therapy Documentation Precautions:  Precautions Precautions: Fall Precaution Comments: freq cues to adhere to sternal prec Required Braces or Orthoses: Spinal Brace Spinal Brace: Lumbar corset Other Brace: for comfort Restrictions Weight Bearing Restrictions: No Other Position/Activity Restrictions: sternal precautions  Therapy/Group: Individual Therapy AAlfonse AlpersPT, DPT   05/28/2020, 7:33 AM

## 2020-05-28 NOTE — Progress Notes (Signed)
Hypoglycemic Event  CBG: 54  Treatment: Orange juice  Symptoms: none  Follow-up CBG: Time 1717  CBG Result:92  Possible Reasons for Event: unknown  Comments/MD notified: followed protocol    Jillyn Ledger

## 2020-05-28 NOTE — Progress Notes (Signed)
Occupational Therapy Session Note  Patient Details  Name: Johnathan Murray MRN: [id number] Date of Birth: 14-Dec-1950  Today's Date: 05/28/2020 OT Individual Time: 1300-1343 OT Individual Time Calculation (min): 43 min    Short Term Goals: Week 2:  OT Short Term Goal 1 (Week 2): Pt will complete toileting tasks with mod assist at sit > stand level OT Short Term Goal 2 (Week 2): Pt will complete LB dressing at sit > stand level with mod assist OT Short Term Goal 3 (Week 2): Pt will complete bathing at shower level with min assist  Skilled Therapeutic Interventions/Progress Updates:    Treatment session with focus on functional transfers and discharge planning. Pt received semi-reclined in bed with wife present, agreeable to practice tub transfer. Completed stand pivot transfer with RW with CGA. Provided max cueing to push up from bed rather than pushing from walker, pt adamantly stated that this is the way he plans to continue standing. Provided assistance to stabilize walker during sit <> stands. Demonstrated use of TTB for tub transfer to wife and pt and engaged in discussion about tub set-up at home with pt reporting tub at home was lower to ground and "now I can't do that" referring to the tub transfer. With encouragement pt agreeable to complete tub transfer with lower tub. Completed tub transfer with RW and TTB with CGA, with pt able to clear legs over tub. Upon sitting in tub pt reported pain in back. Provided encouragement, facilitated rest breaks, and discussed potential modifications to tub transfer to reduce back pain. Wife with questions about acquiring a TTB, educated wife on potential options and instructed wife to refer to social work for insurance coverage questions. Pt completed stand pivot transfer with RW and minA for power up due to fatigue. Ended session with pt semi-reclined in bed with wife present and all needs within reach.  Therapy Documentation Precautions:   Precautions Precautions: Fall Precaution Comments: freq cues to adhere to sternal prec Required Braces or Orthoses: Spinal Brace Spinal Brace: Lumbar corset Other Brace: for comfort Restrictions Weight Bearing Restrictions: No Other Position/Activity Restrictions: sternal precautions  Pain:  5/10 pain reported in back upon movement, repositioned   Therapy/Group: Individual Therapy  Michelle Nasuti 05/28/2020, 1:57 PM

## 2020-05-28 NOTE — Progress Notes (Signed)
[place] PHYSICAL MEDICINE & REHABILITATION PROGRESS NOTE  Subjective/Complaints: Patient seen sitting up this morning.  He states he slept well overnight.  He states he has a question but cannot recall.  Wife affirms.  He was seen by cardiology yesterday, notes reviewed-stable.  ROS: Denies shortness of breath, CP, N/V/D  Objective: Vital Signs: Blood pressure (!) 81/39, pulse 70, temperature 98.6 F (37 C), temperature source Oral, resp. rate 17, height 5\' 9"  (1.753 m), weight 86.2 kg, SpO2 99 %. No results found. Recent Labs    05/27/20 0550 05/28/20 0530  WBC 5.8 6.3  HGB 8.5* 8.4*  HCT 28.9* 27.9*  PLT 323 327   Recent Labs    05/26/20 0554 05/27/20 0550  NA 137 137  K 4.2 4.2  CL 100 100  CO2 27 28  GLUCOSE 110* 117*  BUN 28* 30*  CREATININE 1.51* 1.44*  CALCIUM 9.1 9.2    Physical Exam: BP (!) 81/39 (BP Location: Right Arm)   Pulse 70   Temp 98.6 F (37 C) (Oral)   Resp 17   Ht 5\' 9"  (1.753 m)   Wt 86.2 kg   SpO2 99%   BMI 28.06 kg/m  Constitutional: No distress . Vital signs reviewed. HENT: Normocephalic.  Atraumatic. Eyes: EOMI. No discharge. Cardiovascular: No JVD. Respiratory: Normal effort.  No stridor. GI: Non-distended. Skin: Warm and dry.  Intact. Psych: Normal mood.  Normal behavior. Musc: Bilateral knees with edema Neuro: Alert Motor:  Right lower extremity: Hip flexion, knee extension 3 +/5, ankle dorsiflexion 4+/5 Left lower extremity: Hip flexion, knee extension 3+/5, ankle dorsiflexion 4+/5  Assessment/Plan: 1. Functional deficits secondary to debility due to septic knees, disciitis which require 3+ hours per day of interdisciplinary therapy in a comprehensive inpatient rehab setting.  Physiatrist is providing close team supervision and 24 hour management of active medical problems listed below.  Physiatrist and rehab team continue to assess barriers to discharge/monitor patient progress toward functional and medical  goals  Care Tool:  Bathing  Bathing activity did not occur: Refused Body parts bathed by patient: Right arm, Left arm, Chest, Abdomen, Right upper leg, Left upper leg, Face, Front perineal area   Body parts bathed by helper: Right lower leg, Left lower leg     Bathing assist Assist Level: Minimal Assistance - Patient > 75%     Upper Body Dressing/Undressing Upper body dressing   What is the patient wearing?: Pull over shirt, Orthosis    Upper body assist Assist Level: Minimal Assistance - Patient > 75%    Lower Body Dressing/Undressing Lower body dressing      What is the patient wearing?: Pants     Lower body assist Assist for lower body dressing: Moderate Assistance - Patient 50 - 74%     Toileting Toileting Toileting Activity did not occur Landscape architect and hygiene only): N/A (no void or bm)  Toileting assist Assist for toileting: Maximal Assistance - Patient 25 - 49%     Transfers Chair/bed transfer  Transfers assist     Chair/bed transfer assist level: Minimal Assistance - Patient > 75%     Locomotion Ambulation   Ambulation assist      Assist level: 2 helpers Assistive device: Walker-rolling Max distance: 44'   Walk 10 feet activity   Assist     Assist level: Minimal Assistance - Patient > 75% Assistive device: Walker-rolling   Walk 50 feet activity   Assist Walk 50 feet with 2 turns activity did not occur:  Safety/medical concerns         Walk 150 feet activity   Assist Walk 150 feet activity did not occur: Safety/medical concerns         Walk 10 feet on uneven surface  activity   Assist Walk 10 feet on uneven surfaces activity did not occur: Safety/medical concerns         Wheelchair     Assist Will patient use wheelchair at discharge?: Yes Type of Wheelchair: Manual    Wheelchair assist level: Supervision/Verbal cueing Max wheelchair distance: 32ft    Wheelchair 50 feet with 2 turns  activity    Assist            Wheelchair 150 feet activity     Assist            Medical Problem List and Plan: 1.  Decreased functional mobility secondary to sepsis/Enterococcus faecalis bacteremia/discitis/osteomyelitis of L4-5 status post aortic root replacement 04/17/2020 status post cardioversion for atrial fibrillation.  Sternal precautions/lumbar corset for comfort x 4 weeks.  Continue CIR 2.  Antithrombotics: -DVT/anticoagulation: Resumed Eliquis 05/15/2020             -antiplatelet therapy: N/A 3. Pain Management: OxyContin sustained release 10 mg every 12 hours, Lyrica 100 mg 3 times daily Flexeril as as well as oxycodone needed  Relatively controlled on 7/8  Lidoderm patch ordered on 6/30, with improvement 4. Mood: N/A meds- follow for ego support             -antipsychotic agents: N/A 5. Neuropsych: This patient is capable of making decisions on his own behalf. 6. Skin/Wound Care: Routine skin checks 7. Fluids/Electrolytes/Nutrition: Routine in and outs. 8.  ID/Enterococcus bacteremia.  IV ampicillin 2 g every 4 hours as well as Rocephin 2 g every 12 hours completed on 6/30.  Follow-up infectious disease 9.  Acute on chronic anemia.  Continue iron supplement.    Hb 8.4 on 7/8  Cont to monitor 10.  Bilateral septic knees.  Status post total synovectomy irrigation debridement bilateral knees 05/02/2020 per Dr. Sharol Given.  Weightbearing as tolerated.   Continue ice to bilateral knees  11.  AKI.     Creatinine 1.44 on 7/7  Secondary to diuresis 12.  PAF.  Follow-up cardiology services.  Amiodarone as directed.    Continue Lopressor 12.5 mg twice daily.    Monitor with increased exertion  Repeat ECG reviewed, showing a flutter with prolonged QTC  Recommendations per cardiology 13.  Diabetes mellitus with hyperglycemia.  Latest hemoglobin A1c 6.8.    Lantus insulin 10 units twice daily, decreased to 8 units on 7/6.   CBG (last 3)  Recent Labs    05/27/20 1631  05/27/20 2126 05/28/20 0558  GLUCAP 135* 138* 147*    Elevated on 7/8  Monitor with increased mobility 14.  Recent C3-4 ACDF 03/02/2020 per [name] for cervical myelopathy.  Cervical collar discontinued 15.  Hypothyroidism.  Synthroid 16. Drug-induced constipation.  MiraLAX daily, Senokot nightly  Adjust bowel meds as necessary 17.  Severe hypoalbuminemia  Supplement initiated on 6/25  IV supplementation x3 completed 18.  Transaminitis: Resolved  LFTs within normal limits on 6/29 19.  Peripheral edema  See #12, #19  IV Lasix and IV albumin per cardiology, additional dose given on 6/29, transitioned to p.o. Lasix on 6/30, decreased on 7/6  Echo reviewed from 6/9-showing normal EF  BNP elevated  Filed Weights   05/26/20 0506 05/27/20 0500 05/28/20 0334  Weight: 85.1 kg 85.4 kg  86.2 kg   Stable on 7/8 20.  Prolonged QTC  Monitor in conjunction with other medications  Appreciate cards recs 21.  Hypothyroidism  TSH elevated on 6/29 22.  Borderline soft blood pressures   Vitals:   05/27/20 2006 05/28/20 0334  BP:  (!) 81/39  Pulse: 80 70  Resp:  17  Temp:  98.6 F (37 C)  SpO2: 100% 99%   Secondary to diuresis, asymptomatic on 7/8 23.  Hypokalemia-secondary to diuretics  Potassium 4.2 on 7/7  Supplement increased on 7/1  LOS: 14 days A FACE TO FACE EVALUATION WAS PERFORMED  Johnathan Murray Lorie Phenix 05/28/2020, 9:00 AM

## 2020-05-29 ENCOUNTER — Inpatient Hospital Stay (HOSPITAL_COMMUNITY): Payer: BC Managed Care – PPO

## 2020-05-29 ENCOUNTER — Inpatient Hospital Stay (HOSPITAL_COMMUNITY): Payer: BC Managed Care – PPO | Admitting: Occupational Therapy

## 2020-05-29 LAB — CBC WITH DIFFERENTIAL/PLATELET
Abs Immature Granulocytes: 0.04 10*3/uL (ref 0.00–0.07)
Basophils Absolute: 0.1 10*3/uL (ref 0.0–0.1)
Basophils Relative: 1 %
Eosinophils Absolute: 0.2 10*3/uL (ref 0.0–0.5)
Eosinophils Relative: 3 %
HCT: 30.2 % — ABNORMAL LOW (ref 39.0–52.0)
Hemoglobin: 9 g/dL — ABNORMAL LOW (ref 13.0–17.0)
Immature Granulocytes: 1 %
Lymphocytes Relative: 10 %
Lymphs Abs: 0.7 10*3/uL (ref 0.7–4.0)
MCH: 27.3 pg (ref 26.0–34.0)
MCHC: 29.8 g/dL — ABNORMAL LOW (ref 30.0–36.0)
MCV: 91.5 fL (ref 80.0–100.0)
Monocytes Absolute: 0.5 10*3/uL (ref 0.1–1.0)
Monocytes Relative: 7 %
Neutro Abs: 5.4 10*3/uL (ref 1.7–7.7)
Neutrophils Relative %: 78 %
Platelets: 330 10*3/uL (ref 150–400)
RBC: 3.3 MIL/uL — ABNORMAL LOW (ref 4.22–5.81)
RDW: 17.3 % — ABNORMAL HIGH (ref 11.5–15.5)
WBC: 6.8 10*3/uL (ref 4.0–10.5)
nRBC: 0 % (ref 0.0–0.2)

## 2020-05-29 LAB — GLUCOSE, CAPILLARY
Glucose-Capillary: 115 mg/dL — ABNORMAL HIGH (ref 70–99)
Glucose-Capillary: 138 mg/dL — ABNORMAL HIGH (ref 70–99)
Glucose-Capillary: 96 mg/dL (ref 70–99)
Glucose-Capillary: 197 mg/dL — ABNORMAL HIGH (ref 70–99)

## 2020-05-29 MED ORDER — LIDOCAINE 5 % EX PTCH
2.0000 | MEDICATED_PATCH | CUTANEOUS | Status: DC
  Administered 2020-05-29 – 2020-06-03 (×6): 2 via TRANSDERMAL
  Filled 2020-05-29 (×5): qty 2

## 2020-05-29 NOTE — Plan of Care (Signed)
  Problem: RH Bathing Goal: LTG Patient will bathe all body parts with assist levels (OT) Description: LTG: Patient will bathe all body parts with assist levels (OT) Flowsheets (Taken 05/29/2020 0800) LTG: Pt will perform bathing with assistance level/cueing: (downgraded) Minimal Assistance - Patient > 75% Note: Downgraded due to decreased standing balance and ability to reach posteriorly to wash buttocks   Problem: RH Dressing Goal: LTG Patient will perform lower body dressing w/assist (OT) Description: LTG: Patient will perform lower body dressing with assist, with/without cues in positioning using equipment (OT) Flowsheets (Taken 05/29/2020 0800) LTG: Pt will perform lower body dressing with assistance level of: (downgraded) Minimal Assistance - Patient > 75% Note: Downgraded due to decreased reach as needed to don TEDS   Problem: RH Toileting Goal: LTG Patient will perform toileting task (3/3 steps) with assistance level (OT) Description: LTG: Patient will perform toileting task (3/3 steps) with assistance level (OT)  Flowsheets (Taken 05/29/2020 0800) LTG: Pt will perform toileting task (3/3 steps) with assistance level: (downgraded) Minimal Assistance - Patient > 75% Note: Downgraded due to decreased standing balance and ability to reach posteriorly to reach buttocks for hygiene

## 2020-05-29 NOTE — Plan of Care (Signed)
  Problem: RH Ambulation Goal: LTG Patient will ambulate in controlled environment (PT) Description: LTG: Patient will ambulate in a controlled environment, # of feet with assistance (PT). Flowsheets (Taken 05/29/2020 0730) LTG: Pt will ambulate in controlled environ  assist needed:: (downgraded due to low back pain, decreased endurance, and decreased balance/postural control) Contact Guard/Touching assist LTG: Ambulation distance in controlled environment: 31ft with LRAD Note: downgraded due to low back pain, decreased endurance, and decreased balance/postural control Goal: LTG Patient will ambulate in home environment (PT) Description: LTG: Patient will ambulate in home environment, # of feet with assistance (PT). Flowsheets (Taken 05/29/2020 0730) LTG: Pt will ambulate in home environ  assist needed:: (downgraded due to low back pain, decreased endurance, and decreased balance/postural control) Contact Guard/Touching assist LTG: Ambulation distance in home environment: 55ft with LRAD Note: downgraded due to low back pain, decreased endurance, and decreased balance/postural control

## 2020-05-29 NOTE — Progress Notes (Signed)
[place] PHYSICAL MEDICINE & REHABILITATION PROGRESS NOTE  Subjective/Complaints: Patient seen laying in bed this AM.  He states he slept well overnight.  He has several questions regarding BP, Afib.  ROS: Denies CP, SOB, N/V/D  Objective: Vital Signs: Blood pressure (!) 84/62, pulse 75, temperature 97.6 F (36.4 C), temperature source Oral, resp. rate 16, height 5\' 9"  (1.753 m), weight 85.2 kg, SpO2 100 %. No results found. Recent Labs    05/27/20 0550 05/28/20 0530  WBC 5.8 6.3  HGB 8.5* 8.4*  HCT 28.9* 27.9*  PLT 323 327   Recent Labs    05/27/20 0550  NA 137  K 4.2  CL 100  CO2 28  GLUCOSE 117*  BUN 30*  CREATININE 1.44*  CALCIUM 9.2    Physical Exam: BP (!) 84/62 (BP Location: Left Arm)   Pulse 75   Temp 97.6 F (36.4 C) (Oral)   Resp 16   Ht 5\' 9"  (1.753 m)   Wt 85.2 kg   SpO2 100%   BMI 27.74 kg/m  Constitutional: No distress . Vital signs reviewed. HENT: Normocephalic.  Atraumatic. Eyes: EOMI. No discharge. Cardiovascular: No JVD. Irregularly irregular. Respiratory: Normal effort.  No stridor. Bilaterally clear to auscultation.  GI: Non-distended. BS+.  Skin: Warm and dry.  Intact. Psych: Normal mood.  Normal behavior. Musc: Bilateral knee edema, improving Neuro: Alert Motor:  Right lower extremity: Hip flexion, knee extension 4-/5, ankle dorsiflexion 4+/5 Left lower extremity: Hip flexion, knee extension 4-/5, ankle dorsiflexion 4+/5  Assessment/Plan: 1. Functional deficits secondary to debility due to septic knees, disciitis which require 3+ hours per day of interdisciplinary therapy in a comprehensive inpatient rehab setting.  Physiatrist is providing close team supervision and 24 hour management of active medical problems listed below.  Physiatrist and rehab team continue to assess barriers to discharge/monitor patient progress toward functional and medical goals  Care Tool:  Bathing  Bathing activity did not occur: Refused Body  parts bathed by patient: Right arm, Left arm, Chest, Abdomen, Right upper leg, Left upper leg, Face, Front perineal area   Body parts bathed by helper: Right lower leg, Left lower leg     Bathing assist Assist Level: Minimal Assistance - Patient > 75%     Upper Body Dressing/Undressing Upper body dressing   What is the patient wearing?: Pull over shirt, Orthosis    Upper body assist Assist Level: Minimal Assistance - Patient > 75%    Lower Body Dressing/Undressing Lower body dressing      What is the patient wearing?: Pants     Lower body assist Assist for lower body dressing: Moderate Assistance - Patient 50 - 74%     Toileting Toileting Toileting Activity did not occur Landscape architect and hygiene only): N/A (no void or bm)  Toileting assist Assist for toileting: Maximal Assistance - Patient 25 - 49%     Transfers Chair/bed transfer  Transfers assist     Chair/bed transfer assist level: Minimal Assistance - Patient > 75%     Locomotion Ambulation   Ambulation assist      Assist level: Contact Guard/Touching assist Assistive device: Parallel bars Max distance: 20'   Walk 10 feet activity   Assist     Assist level: Contact Guard/Touching assist Assistive device: Parallel bars   Walk 50 feet activity   Assist Walk 50 feet with 2 turns activity did not occur: Safety/medical concerns         Walk 150 feet activity   Assist  Walk 150 feet activity did not occur: Safety/medical concerns         Walk 10 feet on uneven surface  activity   Assist Walk 10 feet on uneven surfaces activity did not occur: Safety/medical concerns         Wheelchair     Assist Will patient use wheelchair at discharge?: Yes Type of Wheelchair: Manual    Wheelchair assist level: Supervision/Verbal cueing Max wheelchair distance: 82ft    Wheelchair 50 feet with 2 turns activity    Assist            Wheelchair 150 feet activity      Assist            Medical Problem List and Plan: 1.  Decreased functional mobility secondary to sepsis/Enterococcus faecalis bacteremia/discitis/osteomyelitis of L4-5 status post aortic root replacement 04/17/2020 status post cardioversion for atrial fibrillation.  Sternal precautions/lumbar corset for comfort x 4 weeks.  Continue CIR 2.  Antithrombotics: -DVT/anticoagulation: Resumed Eliquis 05/15/2020             -antiplatelet therapy: N/A 3. Pain Management: OxyContin sustained release 10 mg every 12 hours, Lyrica 100 mg 3 times daily Flexeril as as well as oxycodone needed  Relatively controlled on 7/9  Lidoderm patch ordered on 6/30, with improvement 4. Mood: N/A meds- follow for ego support             -antipsychotic agents: N/A 5. Neuropsych: This patient is capable of making decisions on his own behalf. 6. Skin/Wound Care: Routine skin checks 7. Fluids/Electrolytes/Nutrition: Routine in and outs. 8.  ID/Enterococcus bacteremia.  IV ampicillin 2 g every 4 hours as well as Rocephin 2 g every 12 hours completed on 6/30.  Follow-up infectious disease 9.  Acute on chronic anemia.  Continue iron supplement.    Hb 8.4 on 7/8  Cont to monitor 10.  Bilateral septic knees.  Status post total synovectomy irrigation debridement bilateral knees 05/02/2020 per Dr. Sharol Given.  Weightbearing as tolerated.   Continue ice to bilateral knees  11.  AKI.     Creatinine 1.44 on 7/7, labs ordered Monday  Secondary to diuresis 12.  PAF.  Follow-up cardiology services.  Amiodarone as directed.    Continue Lopressor 12.5 mg twice daily.    Monitor with increased exertion  Repeat ECG reviewed, showing a flutter with prolonged QTC  Recommendations per cardiology  Will follow up with Cards regarding Afib with RVR 13.  Diabetes mellitus with hyperglycemia.  Latest hemoglobin A1c 6.8.    Lantus insulin 10 units twice daily, decreased to 8 units on 7/6.   CBG (last 3)  Recent Labs    05/28/20 1717  05/28/20 2124 05/29/20 0557  GLUCAP 92 158* 115*    Labile on 7/9  Monitor with increased mobility 14.  Recent C3-4 ACDF 03/02/2020 per [name] for cervical myelopathy.  Cervical collar discontinued 15.  Hypothyroidism.  Synthroid 16. Drug-induced constipation.  MiraLAX daily, Senokot nightly  Adjust bowel meds as necessary 17.  Severe hypoalbuminemia  Supplement initiated on 6/25  IV supplementation x3 completed 18.  Transaminitis: Resolved  LFTs within normal limits on 6/29 19.  Peripheral edema  See #12, #19  IV Lasix and IV albumin per cardiology, additional dose given on 6/29, transitioned to p.o. Lasix on 6/30, decreased on 7/6  Echo reviewed from 6/9-showing normal EF  BNP elevated  Filed Weights   05/27/20 0500 05/28/20 0334 05/29/20 0453  Weight: 85.4 kg 86.2 kg 85.2 kg  Stable on 7/9 20.  Prolonged QTC  Monitor in conjunction with other medications  Appreciate cards recs 21.  Hypothyroidism  TSH elevated on 6/29 22.  Borderline soft blood pressures   Vitals:   05/28/20 2137 05/29/20 0436  BP:  (!) 84/62  Pulse: 80 75  Resp:  16  Temp:  97.6 F (36.4 C)  SpO2:  100%   Secondary to diuresis, asymptomatic on 7/9 23.  Hypokalemia-secondary to diuretics  Potassium 4.2 on 7/7  Supplement increased on 7/1  LOS: 15 days A FACE TO FACE EVALUATION WAS PERFORMED  Iosefa Weintraub Lorie Phenix 05/29/2020, 8:55 AM

## 2020-05-29 NOTE — Progress Notes (Signed)
Occupational Therapy Weekly Progress Note  Patient Details  Name: Johnathan Murray MRN: [id number] Date of Birth: May 24, 1951  Beginning of progress report period: May 22, 2020 End of progress report period: May 29, 2020  Today's Date: 05/29/2020 OT Individual Time: 1100-1200 OT Individual Time Calculation (min): 60 min    Patient has met 2 of 3 short term goals.  Pt is making steady progress towards goals.  Pt currently requires min-mod assist for sit > stand depending on height of surface as pt requires increased assist from lower heights due to sternal precautions and decreased flexion in B knees.  Pt is currently able to complete LB dressing with min assist at bed level with use of reacher, pt prefers to complete LB dressing at bed level however has demonstrated ability to complete at sit > stand level with min assist.  Pt currently requires min assistance with hygiene with toileting.  Completed shower from roll in shower chair due to pt fearfulness of transfers and has refused additional shower to this point.  Pt is able to complete bathing at overall min-mod assist with use of AE to reach lower legs and feet.  Patient continues to demonstrate the following deficits: muscle weaknessand decreased sitting balance, decreased standing balance, decreased postural control and difficulty maintaining precautions and therefore will continue to benefit from skilled OT intervention to enhance overall performance with BADL and Reduce care partner burden.  Patient progressing toward long term goals..  Continue plan of care.  OT Short Term Goals Week 2:  OT Short Term Goal 1 (Week 2): Pt will complete toileting tasks with mod assist at sit > stand level OT Short Term Goal 1 - Progress (Week 2): Met OT Short Term Goal 2 (Week 2): Pt will complete LB dressing at sit > stand level with mod assist OT Short Term Goal 2 - Progress (Week 2): Met OT Short Term Goal 3 (Week 2): Pt will complete bathing at  shower level with min assist OT Short Term Goal 3 - Progress (Week 2): Partly met Week 3:  OT Short Term Goal 1 (Week 3): STG = LTGs due to remaining LOS  Skilled Therapeutic Interventions/Progress Updates:    Treatment session with focus on sit > stand and dynamic standing balance. Pt received supine in bed already dressed and agreeable to therapy session.  Discussed weekly progress note with progress towards goals.  Completed simulated toileting with pt completing stand pivot transfer bed <> BSC with RW with min assist and pt able to pull pants up/down while alternating UE support on RW with CGA for standing balance.  Pt requested to complete sidestepping in parallel bars.  Pt completed sit <> stand with CGA throughout task and completed sidestepping to Rt and Lt ~5' x4.  Engaged in dynamic standing activity incorporating tossing bean bags to challenge standing balance while alternating UE support on RW.  Pt completed sit> stand CGA to Min assist as he fatigued.  CGA provided for standing balance during bean bag toss.  Pt returned to bed Min assist sit > stand and stand pivot to bed.  Discussed recommended DME with pt and pt's wife.  Pt requesting elongated BSC for increased success with toileting -provided options to which wife reports their insurance covers 100% of DME.  Notified SWK of DME requests to follow up regarding coverage and ordering.    Therapy Documentation Precautions:  Precautions Precautions: Fall Precaution Comments: freq cues to adhere to sternal prec Required Braces or Orthoses: Spinal Brace  Spinal Brace: Lumbar corset Other Brace: for comfort Restrictions Weight Bearing Restrictions: No Other Position/Activity Restrictions: sternal precautions General:   Vital Signs: Therapy Vitals Temp: 97.6 F (36.4 C) Temp Source: Oral Pulse Rate: 75 Resp: 16 BP: (!) 84/62 Patient Position (if appropriate): Lying Oxygen Therapy SpO2: 100 % O2 Device: Room Air Pain:   Pt with  c/o pain in lower back, not rated.  Repositioned and provided support to back to decrease pain.  Premedicated.  Therapy/Group: Individual Therapy  Simonne Come 05/29/2020, 8:03 AM

## 2020-05-29 NOTE — Progress Notes (Signed)
Progress Note  Patient Name: Johnathan Murray Date of Encounter: 05/29/2020  [facility] Cardiologist: Jenean Lindau, MD   Subjective   No CP no significant SOB  Inpatient Medications    Scheduled Meds: . amiodarone  200 mg Oral Daily  . amoxicillin  500 mg Oral Q12H  . apixaban  5 mg Oral BID  . feeding supplement (ENSURE ENLIVE)  237 mL Oral TID BM  . feeding supplement (PRO-STAT SUGAR FREE 64)  30 mL Oral BID  . ferrous [id number] C-folic acid  1 capsule Oral Q breakfast  . ferrous sulfate  325 mg Oral BID WC  . furosemide  60 mg Oral Daily  . hydrocortisone cream   Topical TID  . insulin aspart  0-20 Units Subcutaneous TID WC  . insulin glargine  8 Units Subcutaneous BID  . levothyroxine  25 mcg Oral QAC breakfast  . [START ON 05/30/2020] lidocaine  2 patch Transdermal Q24H  . metoprolol tartrate  12.5 mg Oral BID  . oxyCODONE  10 mg Oral Q12H  . polyethylene glycol  17 g Oral Daily  . potassium chloride  20 mEq Oral BID  . pregabalin  100 mg Oral TID  . senna  1 tablet Oral QHS   Continuous Infusions:  PRN Meds: acetaminophen **OR** acetaminophen, albuterol, cyclobenzaprine, oxyCODONE, sodium chloride flush, sorbitol   Vital Signs    Vitals:   05/28/20 2131 05/28/20 2137 05/29/20 0436 05/29/20 0453  BP: (!) 89/56  (!) 84/62   Pulse: (!) 115 80 75   Resp:   16   Temp:   97.6 F (36.4 C)   TempSrc:   Oral   SpO2:   100%   Weight:    85.2 kg  Height:        Intake/Output Summary (Last 24 hours) at 05/29/2020 1250 Last data filed at 05/29/2020 0730 Gross per 24 hour  Intake 480 ml  Output 1900 ml  Net -1420 ml   Last 3 Weights 05/29/2020 05/28/2020 05/27/2020  Weight (lbs) 187 lb 13.3 oz 190 lb 0.6 oz 188 lb 4.4 oz  Weight (kg) 85.2 kg 86.2 kg 85.4 kg      Physical Exam   GEN: No acute distress.   Neck: No JVD Cardiac: RRR, no murmurs, rubs, or gallops.  Respiratory: Clear to auscultation bilaterally. GI: Soft, nontender,  non-distended  MS: Mild LE edema; No deformity. Staples removed Neuro:  Nonfocal  Psych: Normal affect   Labs    High Sensitivity Troponin:  No results for input(s): TROPONINIHS in the last 720 hours.    Chemistry Recent Labs  Lab 05/25/20 0717 05/26/20 0554 05/27/20 0550  NA 139 137 137  K 4.0 4.2 4.2  CL 101 100 100  CO2 28 27 28   GLUCOSE 100* 110* 117*  BUN 29* 28* 30*  CREATININE 1.44* 1.51* 1.44*  CALCIUM 9.2 9.1 9.2  GFRNONAA 50* 47* 50*  GFRAA 57* 54* 57*  ANIONGAP 10 10 9      Hematology Recent Labs  Lab 05/27/20 0550 05/28/20 0530 05/29/20 1009  WBC 5.8 6.3 6.8  RBC 3.14* 3.04* 3.30*  HGB 8.5* 8.4* 9.0*  HCT 28.9* 27.9* 30.2*  MCV 92.0 91.8 91.5  MCH 27.1 27.6 27.3  MCHC 29.4* 30.1 29.8*  RDW 17.4* 17.4* 17.3*  PLT 323 327 330    BNPNo results for input(s): BNP, PROBNP in the last 168 hours.   DDimer No results for input(s): DDIMER in the last 168 hours.  Radiology    No results found.  Cardiac Studies   EF normal  Patient Profile     69 y.o. 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 now s/p debridement of aortic root abscess and aortic root replacement on 04/16/2020, paroxysmal atrial fibrillation on Eliquis,recurrent bilateral knee effusionsand septic arthritis with arthrocentesis (6/10),hypertension, dyslipidemia, diabetes on insulin, hypothyroidism, lumbar stenosis with neurogenic claudication, and cervical decompression in 3/2021and a fib in rehab and back to main hospital at [facility] for amiodarone and thought to repeat DCCV in future if a fib persists and infection improves (this was written 02/05/20)now with volume overload.  Assessment & Plan    Persistent atrial fibrillation -Failed cardioversion in the setting of infection. -Continuing with amiodarone with  consideration in future of repeat cardioversion.  Nonetheless, it is helping with rate control especially in the setting of hypotension. -Low-dose metoprolol. No changes  Diastolic heart failure -Continue with Lasix 60 mg a day.  This is slightly increased from before.  Protein. Creat stable. OK to continue.   Chronic anticoagulation -Eliquis.  Has had difficulty with bleeding requiring transfusions previously.  Monitoring. HGB stable, no changes  Recurrent septic knee/osteomyelitis of L4-L5/aortic root abscess -Antibiotics PICC line.  Aortic valve replacement -Currently stable.  Has had endocarditis.  Dental prophylaxis.  CAD post CABG -Stable no angina.  2 out of 3 grafts are occluded. No changes       For questions or updates, please contact [place] Please consult [email or web address] for contact info under        Signed, Candee Furbish, MD  05/29/2020, 12:50 PM

## 2020-05-29 NOTE — Progress Notes (Signed)
Physical Therapy Session Note  Patient Details  Name: Johnathan Murray MRN: [id number] Date of Birth: [birth year]  Today's Date: 05/29/2020 PT Individual Time: [phone] and 1400-1458 PT Individual Time Calculation (min): 53 min and 58 min   Short Term Goals: Week 2:  PT Short Term Goal 1 (Week 2): pt will transfer sit<>stand with LRAD min A overall PT Short Term Goal 1 - Progress (Week 2): Met PT Short Term Goal 2 (Week 2): Pt will ambulate 80f with LRAD mod A of 1 PT Short Term Goal 2 - Progress (Week 2): Progressing toward goal PT Short Term Goal 3 (Week 2): Pt will perform simulated car transfer with LRAD min A PT Short Term Goal 3 - Progress (Week 2): Progressing toward goal Week 3:  PT Short Term Goal 1 (Week 3): STG=LTG due to LOS  Skilled Therapeutic Interventions/Progress Updates:   Treatment Session 1: [id number] min Received pt supine in bed, pt agreeable to therapy, and reported pain 4/10 in low back (premedicated). Repositioning, rest breaks, and distraction done to reduce pain levels. Session with emphasis on functional mobility/transfers, generalized strengthening, dynamic standing balance/coordination, ambulation, stair navigation, and improved activity tolerance. Pt transferred supine<>sitting EOB with HOB elevated and supervision and donned shoes sitting EOB with min A. Pt transferred bed<>WC via stand<>pivot with RW min A. [facility] purposes. Pt navigated 2 steps with L handrail and R UE supported around therapist with mod A ascending forwards and descending backwards with a step to pattern. Pt required cues for stepping technique ("up with the good and down with the bad") and upright posture. Pt ambulated 225fwith RW min A with wife providing WC follow. Pt reported R knee feeling like it was going to "give out". Therapist suggested ace wrapping R knee and pt ambulated additional 3075fith RW min A with ace wrap around R  knee. Pt reported R knee felt more stable with ace wrap. Pt ambulated 82f5f uneven surfaces (ramp) with RW and min A with cues for RW safety and technique. Pt reported increased low back pain and was [facility] Pt transferred WC<>bed stand<>pivot with RW min A from pt's wife and doffed shoes sitting EOB with supervision. Pt transferred sit<>supine min A for LE management. Concluded session with pt supine in bed, needs within reach, and no bed alarm on but pt's wife present at bedside. Therapist provided drink for pt.   Treatment Session 2: [id number] Received pt supine in bed, pt agreeable to therapy, and denied any pain at start of session but reported increased low back pain with mobility. Repositioning, rest breaks, and distraction done to reduce pain levels. Session with emphasis on functional mobility/transfers, generalized strengthening, dynamic standing balance/coordination, stair navigation, and improved activity tolerance. Pt transferred supine<>sitting EOB with HOB elevated and supervision and donned shoes sitting EOB with min A. Donned ace wrap to R knee total A and pt transferred bed<>WC via stand<>pivot with RW min A. Pt [facility] for [facility] management purposes. Pt navigated 2 steps with L handrail and R UE supported around therapist with mod A ascending forwards and descending backwards with a step to pattern. Pt required cues for stepping technique ("up with the good and down with the bad") and for upright posture. Pt transported to therapy gym and transferred stand<>pivot WC<>mat with RW min A. Pt worked on dynaTour manager  with bilateral UE support on RW and CGA/min A x 2 trials with cues for upright posture and RW safety. Pt with increased low back pain and requested to sit in more comfortable chair and transferred stand<>pivot mat<>chair with RW min A. Pt reported decreased low back pain and performed the  following exercises sitting in chair with supervision, increased time, and verbal cues for technique: -hip adduction ball squeezes x10 with 3 second isometric hold  -knee extension with 0.5lb ankle weight x8 bilaterally  Pt transferred chair<>WC stand<>pivot with RW min A and transported back to room in [facility] total A. Pt transferred WC<>bed stand<>pivot with RW min A from pt's wife and doffed shoes sitting EOB with supervision. Pt transferred sit<>supine min A for LE management. Concluded session with pt supine in bed, needs within reach, and no bed alarm on but pt's wife present at bedside.   Therapy Documentation Precautions:  Precautions Precautions: Fall Precaution Comments: freq cues to adhere to sternal prec Required Braces or Orthoses: Spinal Brace Spinal Brace: Lumbar corset Other Brace: for comfort Restrictions Weight Bearing Restrictions: No Other Position/Activity Restrictions: sternal precautions   Therapy/Group: Individual Therapy Alfonse Alpers PT, DPT   05/29/2020, 7:25 AM

## 2020-05-29 NOTE — Progress Notes (Signed)
   05/29/20 1549  Assess: MEWS Score  Temp 98.7 F (37.1 C)  BP 103/76  Pulse Rate (!) 134  Resp 17  SpO2 100 %  O2 Device Room Air  Assess: MEWS Score  MEWS Temp 0  MEWS Systolic 0  MEWS Pulse 3  MEWS RR 0  MEWS LOC 0  MEWS Score 3  MEWS Score Color Yellow  Assess: if the MEWS score is Yellow or Red  Were vital signs taken at a resting state? Yes  Focused Assessment Documented focused assessment  Early Detection of Sepsis Score *See Row Information* Low  MEWS guidelines implemented *See Row Information* Yes  Treat  MEWS Interventions Escalated (See documentation below)  Take Vital Signs  Increase Vital Sign Frequency  Yellow: Q 2hr X 2 then Q 4hr X 2, if remains yellow, continue Q 4hrs  Escalate  MEWS: Escalate Yellow: discuss with charge nurse/RN and consider discussing with provider and RRT  Notify: Charge Nurse/RN  Name of Charge Nurse/RN Notified Isla Pence, RN  Date Charge Nurse/RN Notified 05/29/20  Time Charge Nurse/RN Notified 1600  Notify: Provider  Provider Name/Title Marlowe Shores, PA  Date Provider Notified 05/29/20  Time Provider Notified [phone]  Notification Type Call  Notification Reason Other (Comment) (HR 134)  Response No new orders (Cardiology is aware. pt at baseline)  Date of Provider Response 05/29/20  Time of Provider Response 1609  Document  Patient Outcome Not stable and remains on department (no intervention needed at this time per Linna Hoff, [place])  Progress note created (see row info) Yes   Yellow MEWs triggered due to HR 134. Manual HR is 138 BPM. Pt alert, oriented and at baseline. Pt is no distress. Linna Hoff, [place] notified. No new orders at this time. Cardiology is aware. Will continue to monitor.   Gerald Stabs, RN

## 2020-05-30 DIAGNOSIS — D62 Acute posthemorrhagic anemia: Secondary | ICD-10-CM

## 2020-05-30 DIAGNOSIS — I952 Hypotension due to drugs: Secondary | ICD-10-CM

## 2020-05-30 LAB — CBC WITH DIFFERENTIAL/PLATELET
Abs Immature Granulocytes: 0.03 10*3/uL (ref 0.00–0.07)
Basophils Absolute: 0.1 10*3/uL (ref 0.0–0.1)
Basophils Relative: 1 %
Eosinophils Absolute: 0.3 10*3/uL (ref 0.0–0.5)
Eosinophils Relative: 5 %
HCT: 28.4 % — ABNORMAL LOW (ref 39.0–52.0)
Hemoglobin: 8.4 g/dL — ABNORMAL LOW (ref 13.0–17.0)
Immature Granulocytes: 1 %
Lymphocytes Relative: 18 %
Lymphs Abs: 1.2 10*3/uL (ref 0.7–4.0)
MCH: 27.1 pg (ref 26.0–34.0)
MCHC: 29.6 g/dL — ABNORMAL LOW (ref 30.0–36.0)
MCV: 91.6 fL (ref 80.0–100.0)
Monocytes Absolute: 0.5 10*3/uL (ref 0.1–1.0)
Monocytes Relative: 8 %
Neutro Abs: 4.3 10*3/uL (ref 1.7–7.7)
Neutrophils Relative %: 67 %
Platelets: 311 10*3/uL (ref 150–400)
RBC: 3.1 MIL/uL — ABNORMAL LOW (ref 4.22–5.81)
RDW: 17.7 % — ABNORMAL HIGH (ref 11.5–15.5)
WBC: 6.4 10*3/uL (ref 4.0–10.5)
nRBC: 0 % (ref 0.0–0.2)

## 2020-05-30 LAB — GLUCOSE, CAPILLARY
Glucose-Capillary: 153 mg/dL — ABNORMAL HIGH (ref 70–99)
Glucose-Capillary: 92 mg/dL (ref 70–99)
Glucose-Capillary: 173 mg/dL — ABNORMAL HIGH (ref 70–99)
Glucose-Capillary: 140 mg/dL — ABNORMAL HIGH (ref 70–99)

## 2020-05-30 LAB — ACID FAST CULTURE WITH REFLEXED SENSITIVITIES (MYCOBACTERIA): Acid Fast Culture: NEGATIVE

## 2020-05-30 NOTE — Progress Notes (Signed)
Progress Note  Patient Name: Johnathan Murray Date of Encounter: 05/30/2020  [place] [facility] Cardiologist: Jenean Lindau, MD   Subjective   Doing well no palpitations ? D/c from rehab Thursday   Inpatient Medications    Scheduled Meds: . amiodarone  200 mg Oral Daily  . amoxicillin  500 mg Oral Q12H  . apixaban  5 mg Oral BID  . feeding supplement (ENSURE ENLIVE)  237 mL Oral TID BM  . feeding supplement (PRO-STAT SUGAR FREE 64)  30 mL Oral BID  . ferrous [id number] C-folic acid  1 capsule Oral Q breakfast  . ferrous sulfate  325 mg Oral BID WC  . furosemide  60 mg Oral Daily  . hydrocortisone cream   Topical TID  . insulin aspart  0-20 Units Subcutaneous TID WC  . insulin glargine  8 Units Subcutaneous BID  . levothyroxine  25 mcg Oral QAC breakfast  . lidocaine  2 patch Transdermal Q24H  . metoprolol tartrate  12.5 mg Oral BID  . oxyCODONE  10 mg Oral Q12H  . polyethylene glycol  17 g Oral Daily  . potassium chloride  20 mEq Oral BID  . pregabalin  100 mg Oral TID  . senna  1 tablet Oral QHS   Continuous Infusions:  PRN Meds: acetaminophen **OR** acetaminophen, albuterol, cyclobenzaprine, oxyCODONE, sodium chloride flush, sorbitol   Vital Signs    Vitals:   05/29/20 1802 05/29/20 1957 05/29/20 2359 05/30/20 0408  BP: (!) 89/68 100/62 (!) 83/54 (!) 95/57  Pulse: (!) 104 89 88 (!) 56  Resp: 16 18 17 18   Temp: 98.7 F (37.1 C) 98.8 F (37.1 C) 98.4 F (36.9 C) 98.1 F (36.7 C)  TempSrc:  Oral Oral Oral  SpO2: 99% 99% 99% 99%  Weight:    84.3 kg  Height:        Intake/Output Summary (Last 24 hours) at 05/30/2020 1040 Last data filed at 05/30/2020 0947 Gross per 24 hour  Intake 480 ml  Output 2275 ml  Net -1795 ml   Last 3 Weights 05/30/2020 05/29/2020 05/28/2020  Weight (lbs) 185 lb 13.6 oz 187 lb 13.3 oz 190 lb 0.6 oz  Weight (kg) 84.3 kg 85.2 kg 86.2 kg      Physical Exam   Affect appropriate Chronically ill white male  HEENT:  normal Neck supple with no adenopathy JVP normal no bruits no thyromegaly Lungs clear with no wheezing and good diaphragmatic motion Heart:  S1/S2  SEM  murmur, no rub, gallop or click PMI normal post sternotomy  Abdomen: benighn, BS positve, no tenderness, no AAA no bruit.  No HSM or HJR Distal pulses intact with no bruits No edema Neuro non-focal Skin warm and dry Post bilateral TKR;s with effusions and edema    Labs    High Sensitivity Troponin:  No results for input(s): TROPONINIHS in the last 720 hours.    Chemistry Recent Labs  Lab 05/25/20 0717 05/26/20 0554 05/27/20 0550  NA 139 137 137  K 4.0 4.2 4.2  CL 101 100 100  CO2 28 27 28   GLUCOSE 100* 110* 117*  BUN 29* 28* 30*  CREATININE 1.44* 1.51* 1.44*  CALCIUM 9.2 9.1 9.2  GFRNONAA 50* 47* 50*  GFRAA 57* 54* 57*  ANIONGAP 10 10 9      Hematology Recent Labs  Lab 05/28/20 0530 05/29/20 1009 05/30/20 0624  WBC 6.3 6.8 6.4  RBC 3.04* 3.30* 3.10*  HGB 8.4* 9.0* 8.4*  HCT 27.9* 30.2* 28.4*  MCV 91.8 91.5 91.6  MCH 27.6 27.3 27.1  MCHC 30.1 29.8* 29.6*  RDW 17.4* 17.3* 17.7*  PLT 327 330 311    BNPNo results for input(s): BNP, PROBNP in the last 168 hours.   DDimer No results for input(s): DDIMER in the last 168 hours.   Radiology    No results found.  Cardiac Studies   EF normal  Patient Profile     69 y.o. 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 now s/p debridement of aortic root abscess and aortic root replacement on 04/16/2020, paroxysmal atrial fibrillation on Eliquis,recurrent bilateral knee effusionsand septic arthritis with arthrocentesis (6/10),hypertension, dyslipidemia, diabetes on insulin, hypothyroidism, lumbar stenosis with neurogenic claudication, and cervical decompression in 3/2021and a fib in rehab  and back to main hospital at [facility] for amiodarone and thought to repeat DCCV in future if a fib persists and infection improves (this was written 02/05/20)now with volume overload.  Assessment & Plan    Persistent atrial fibrillation -Failed cardioversion in the setting of infection. -continue low dose metoprolol and amidarone  - consider outpatient [facility] in 3 weeks after infectious / medical issues resolved  Diastolic heart failure -Continue with Lasix 60 mg a day.  This is slightly increased from before.  Protein. Creat stable. OK to continue.   Chronic anticoagulation -Eliquis.  Has had difficulty with bleeding requiring transfusions previously.  Monitoring. HGB stable, no changes  Recurrent septic knee/osteomyelitis of L4-L5/aortic root abscess -Antibiotics PICC line.  Aortic valve replacement -Currently stable.  Has had endocarditis.  Dental prophylaxis.No AR murmur   CAD post CABG -Stable no angina.  2 out of 3 grafts are occluded. No changes    For questions or updates, please contact [place] Please consult [email or web address] for contact info under        Signed, Jenkins Rouge, MD  05/30/2020, 10:40 AM

## 2020-05-30 NOTE — Progress Notes (Signed)
[place] PHYSICAL MEDICINE & REHABILITATION PROGRESS NOTE  Subjective/Complaints: Patient seen sitting up in bed this morning eating breakfast.  Wife at bedside.  He states he did not sleep well overnight due to neighboring patient.  He was seen by cardiology yesterday, notes reviewed-no changes  ROS: Denies CP, SOB, N/V/D  Objective: Vital Signs: Blood pressure (!) 95/57, pulse (!) 56, temperature 98.1 F (36.7 C), temperature source Oral, resp. rate 18, height 5\' 9"  (1.753 m), weight 84.3 kg, SpO2 99 %. No results found. Recent Labs    05/29/20 1009 05/30/20 0624  WBC 6.8 6.4  HGB 9.0* 8.4*  HCT 30.2* 28.4*  PLT 330 311   No results for input(s): NA, K, CL, CO2, GLUCOSE, BUN, CREATININE, CALCIUM in the last 72 hours.  Physical Exam: BP (!) 95/57 (BP Location: Right Arm)   Pulse (!) 56   Temp 98.1 F (36.7 C) (Oral)   Resp 18   Ht 5\' 9"  (1.753 m)   Wt 84.3 kg   SpO2 99%   BMI 27.44 kg/m  Constitutional: No distress . Vital signs reviewed. HENT: Normocephalic.  Atraumatic. Eyes: EOMI. No discharge. Cardiovascular: No JVD.  Irregularly irregular. Respiratory: Normal effort.  No stridor.  Bilaterally clear to auscultation. GI: Non-distended.  BS +. Skin: Warm and dry.  Intact. Psych: Normal mood.  Normal behavior. Musc: Bilateral knees with edema Neuro: Alert Motor:  Right lower extremity: Hip flexion, knee extension 4-/5, ankle dorsiflexion 4+/5, stable Left lower extremity: Hip flexion, knee extension 4-/5, ankle dorsiflexion 4+/5  Assessment/Plan: 1. Functional deficits secondary to debility due to septic knees, disciitis which require 3+ hours per day of interdisciplinary therapy in a comprehensive inpatient rehab setting.  Physiatrist is providing close team supervision and 24 hour management of active medical problems listed below.  Physiatrist and rehab team continue to assess barriers to discharge/monitor patient progress toward functional and medical  goals  Care Tool:  Bathing  Bathing activity did not occur: Refused Body parts bathed by patient: Right arm, Left arm, Chest, Abdomen, Right upper leg, Left upper leg, Face, Front perineal area   Body parts bathed by helper: Right lower leg, Left lower leg     Bathing assist Assist Level: Minimal Assistance - Patient > 75%     Upper Body Dressing/Undressing Upper body dressing   What is the patient wearing?: Pull over shirt, Orthosis    Upper body assist Assist Level: Minimal Assistance - Patient > 75%    Lower Body Dressing/Undressing Lower body dressing      What is the patient wearing?: Pants     Lower body assist Assist for lower body dressing: Minimal Assistance - Patient > 75%     Toileting Toileting Toileting Activity did not occur (Clothing management and hygiene only): N/A (no void or bm)  Toileting assist Assist for toileting: Minimal Assistance - Patient > 75%     Transfers Chair/bed transfer  Transfers assist     Chair/bed transfer assist level: Minimal Assistance - Patient > 75%     Locomotion Ambulation   Ambulation assist      Assist level: Minimal Assistance - Patient > 75% Assistive device: Walker-rolling Max distance: 44ft   Walk 10 feet activity   Assist     Assist level: Minimal Assistance - Patient > 75% Assistive device: Walker-rolling   Walk 50 feet activity   Assist Walk 50 feet with 2 turns activity did not occur: Safety/medical concerns         Walk  150 feet activity   Assist Walk 150 feet activity did not occur: Safety/medical concerns         Walk 10 feet on uneven surface  activity   Assist Walk 10 feet on uneven surfaces activity did not occur: Safety/medical concerns   Assist level: Minimal Assistance - Patient > 75% Assistive device: Aeronautical engineer Will patient use wheelchair at discharge?: Yes Type of Wheelchair: Manual    Wheelchair assist level:  Supervision/Verbal cueing Max wheelchair distance: 22ft    Wheelchair 50 feet with 2 turns activity    Assist            Wheelchair 150 feet activity     Assist            Medical Problem List and Plan: 1.  Decreased functional mobility secondary to sepsis/Enterococcus faecalis bacteremia/discitis/osteomyelitis of L4-5 status post aortic root replacement 04/17/2020 status post cardioversion for atrial fibrillation.  Sternal precautions/lumbar corset for comfort x 4 weeks.  Continue CIR 2.  Antithrombotics: -DVT/anticoagulation: Resumed Eliquis 05/15/2020             -antiplatelet therapy: N/A 3. Pain Management: OxyContin sustained release 10 mg every 12 hours, Lyrica 100 mg 3 times daily Flexeril as as well as oxycodone needed  Relatively controlled on 7/10  Lidoderm patch ordered on 6/30, with improvement 4. Mood: N/A meds- follow for ego support             -antipsychotic agents: N/A 5. Neuropsych: This patient is capable of making decisions on his own behalf. 6. Skin/Wound Care: Routine skin checks 7. Fluids/Electrolytes/Nutrition: Routine in and outs. 8.  ID/Enterococcus bacteremia.  IV ampicillin 2 g every 4 hours as well as Rocephin 2 g every 12 hours completed on 6/30.  Follow-up infectious disease 9.  Acute on chronic anemia.  Continue iron supplement.    Hb 8.4 on 7/10  Cont to monitor 10.  Bilateral septic knees.  Status post total synovectomy irrigation debridement bilateral knees 05/02/2020 per Dr. Sharol Given.  Weightbearing as tolerated.   Continue ice to bilateral knees  11.  AKI.     Creatinine 1.44 on 7/7, labs ordered for Monday  Secondary to diuresis 12.  PAF.  Follow-up cardiology services.  Amiodarone as directed.    Continue Lopressor 12.5 mg twice daily.    Monitor with increased exertion  Repeat ECG reviewed, showing a flutter with prolonged QTC  Recommendations per cardiology  Will follow up with Cards regarding Afib with RVR 13.  Diabetes  mellitus with hyperglycemia.  Latest hemoglobin A1c 6.8.    Lantus insulin 10 units twice daily, decreased to 8 units on 7/6.   CBG (last 3)  Recent Labs    05/29/20 2101 05/30/20 0604 05/30/20 1132  GLUCAP 197* 153* 92    Labile on 7/10, monitor for trend  Monitor with increased mobility 14.  Recent C3-4 ACDF 03/02/2020 per [name] for cervical myelopathy.  Cervical collar discontinued 15.  Hypothyroidism.  Synthroid 16. Drug-induced constipation.  MiraLAX daily, Senokot nightly  Adjust bowel meds as necessary 17.  Severe hypoalbuminemia  Supplement initiated on 6/25  IV supplementation x3 completed 18.  Transaminitis: Resolved  LFTs within normal limits on 6/29 19.  Peripheral edema  See #12, #19  IV Lasix and IV albumin per cardiology, additional dose given on 6/29, transitioned to p.o. Lasix on 6/30, decreased on 7/6  Echo reviewed from 6/9-showing normal EF  BNP elevated  [employer]  05/28/20 0334 05/29/20 0453 05/30/20 0408  Weight: 86.2 kg 85.2 kg 84.3 kg   Improving on 7/10 20.  Prolonged QTC  Monitor in conjunction with other medications  Appreciate cards recs 21.  Hypothyroidism  TSH elevated on 6/29 22.  Soft blood pressures   Vitals:   05/29/20 2359 05/30/20 0408  BP: (!) 83/54 (!) 95/57  Pulse: 88 (!) 56  Resp: 17 18  Temp: 98.4 F (36.9 C) 98.1 F (36.7 C)  SpO2: 99% 99%   Secondary to diuresis, asymptomatic on 7/10 23.  Hypokalemia-secondary to diuretics  Potassium 4.2 on 7/7, labs ordered for Monday  Supplement increased on 7/1  LOS: 16 days A FACE TO FACE EVALUATION WAS PERFORMED  Adley Castello Lorie Phenix 05/30/2020, 2:23 PM

## 2020-05-31 ENCOUNTER — Inpatient Hospital Stay (HOSPITAL_COMMUNITY): Payer: BC Managed Care – PPO | Admitting: Occupational Therapy

## 2020-05-31 ENCOUNTER — Inpatient Hospital Stay (HOSPITAL_COMMUNITY): Payer: BC Managed Care – PPO | Admitting: Physical Therapy

## 2020-05-31 LAB — CBC WITH DIFFERENTIAL/PLATELET
Abs Immature Granulocytes: 0.04 10*3/uL (ref 0.00–0.07)
Basophils Absolute: 0.1 10*3/uL (ref 0.0–0.1)
Basophils Relative: 1 %
Eosinophils Absolute: 0.3 10*3/uL (ref 0.0–0.5)
Eosinophils Relative: 5 %
HCT: 29.5 % — ABNORMAL LOW (ref 39.0–52.0)
Hemoglobin: 8.7 g/dL — ABNORMAL LOW (ref 13.0–17.0)
Immature Granulocytes: 1 %
Lymphocytes Relative: 16 %
Lymphs Abs: 1 10*3/uL (ref 0.7–4.0)
MCH: 27.3 pg (ref 26.0–34.0)
MCHC: 29.5 g/dL — ABNORMAL LOW (ref 30.0–36.0)
MCV: 92.5 fL (ref 80.0–100.0)
Monocytes Absolute: 0.5 10*3/uL (ref 0.1–1.0)
Monocytes Relative: 7 %
Neutro Abs: 4.7 10*3/uL (ref 1.7–7.7)
Neutrophils Relative %: 70 %
Platelets: 308 10*3/uL (ref 150–400)
RBC: 3.19 MIL/uL — ABNORMAL LOW (ref 4.22–5.81)
RDW: 17.4 % — ABNORMAL HIGH (ref 11.5–15.5)
WBC: 6.6 10*3/uL (ref 4.0–10.5)
nRBC: 0 % (ref 0.0–0.2)

## 2020-05-31 LAB — GLUCOSE, CAPILLARY
Glucose-Capillary: 161 mg/dL — ABNORMAL HIGH (ref 70–99)
Glucose-Capillary: 77 mg/dL (ref 70–99)
Glucose-Capillary: 124 mg/dL — ABNORMAL HIGH (ref 70–99)
Glucose-Capillary: 140 mg/dL — ABNORMAL HIGH (ref 70–99)

## 2020-05-31 NOTE — Progress Notes (Signed)
Physical Therapy Session Note  Patient Details  Name: Johnathan Murray MRN: [id number] Date of Birth: 11/06/1951  Today's Date: 05/31/2020 PT Individual Time: 1402-1500 PT Individual Time Calculation (min): 58 min   Short Term Goals: Week 3:  PT Short Term Goal 1 (Week 3): STG=LTG due to LOS  Skilled Therapeutic Interventions/Progress Updates: Pt presented in bed agreeable to therapy. Pt states mild discomfort in B knees but unable to rate, no pain in back at rest. Performed bed mobilty with use of bed features and CGA. Performed ambulatory transfer to w/c with RW and CGA. Pt transported partial distance and then ambulated 26f with RW and CGA. Pt then participated in BLE strengthening by performing step ups in parallel bars to 4in step. Pt was able to complete sets of 6, 5, and 5 with seated rests respectively. Pt encouraged to decrease BUE contribution and to push through legs which pt was able to demonstrate approx 50% of time. With fatigue pt indicated increased L knee discomfort therefore extended rest break taken. Pt then ambulated additional 343fwith CGA and close w/c follow. Pt propelled remaining distance back to room and performed stand pivot transfer to bed CGA. Pt required minA for sit to supine for BLE management and increased time to minimize back pain. Once in bed pt repositioned to comfort and left with bed alarm on, call bell within reach and needs met.      Therapy Documentation Precautions:  Precautions Precautions: Fall Precaution Comments: freq cues to adhere to sternal prec Required Braces or Orthoses: Spinal Brace Spinal Brace: Lumbar corset Other Brace: for comfort Restrictions Weight Bearing Restrictions: No Other Position/Activity Restrictions: sternal precautions General:   Vital Signs:   Pain:   Mobility:   Locomotion :    Trunk/Postural Assessment :    Balance:   Exercises:   Other Treatments:      Therapy/Group: Individual  Therapy  Darnesha Diloreto 05/31/2020, 4:25 PM

## 2020-05-31 NOTE — Plan of Care (Signed)
  Problem: Consults Goal: RH GENERAL PATIENT EDUCATION Description: See Patient Education module for education specifics. Outcome: Progressing   Problem: RH BOWEL ELIMINATION Goal: RH STG MANAGE BOWEL WITH ASSISTANCE Description: STG Manage Bowel with mod I Assistance. Outcome: Progressing Goal: RH STG MANAGE BOWEL W/MEDICATION W/ASSISTANCE Description: STG Manage Bowel with Medication with mod I Assistance. Outcome: Progressing   Problem: Consults Goal: RH GENERAL PATIENT EDUCATION Description: See Patient Education module for education specifics. Outcome: Progressing   Problem: RH BOWEL ELIMINATION Goal: RH STG MANAGE BOWEL WITH ASSISTANCE Description: STG Manage Bowel with mod I Assistance. Outcome: Progressing Goal: RH STG MANAGE BOWEL W/MEDICATION W/ASSISTANCE Description: STG Manage Bowel with Medication with mod I Assistance. Outcome: Progressing

## 2020-05-31 NOTE — Progress Notes (Signed)
[place] PHYSICAL MEDICINE & REHABILITATION PROGRESS NOTE  Subjective/Complaints: Patient seen laying in bed this morning.  He states he did not sleep well overnight, only as a result of being in the hospital.  He was seen by cardiology yesterday, notes reviewed-continue current care with consideration of [facility] as outpatient.  ROS: Denies CP, SOB, N/V/D  Objective: Vital Signs: Blood pressure 111/63, pulse 90, temperature 98.5 F (36.9 C), temperature source Oral, resp. rate 16, height 5\' 9"  (1.753 m), weight 84.3 kg, SpO2 99 %. No results found. Recent Labs    05/30/20 0624 05/31/20 0600  WBC 6.4 6.6  HGB 8.4* 8.7*  HCT 28.4* 29.5*  PLT 311 308   No results for input(s): NA, K, CL, CO2, GLUCOSE, BUN, CREATININE, CALCIUM in the last 72 hours.  Physical Exam: BP 111/63 (BP Location: Left Arm)   Pulse 90   Temp 98.5 F (36.9 C) (Oral)   Resp 16   Ht 5\' 9"  (1.753 m)   Wt 84.3 kg   SpO2 99%   BMI 27.44 kg/m  Constitutional: No distress . Vital signs reviewed. HENT: Normocephalic.  Atraumatic. Eyes: EOMI. No discharge. Cardiovascular: No JVD.  Irregularly irregular. Respiratory: Normal effort.  No stridor.  Bilaterally clear to auscultation. GI: Non-distended.  BS +. Skin: Knee incisions healing. Psych: Normal mood.  Normal behavior. Musc: Bilateral knees with edema, stable. Neuro: Alert Motor:  Right lower extremity: Hip flexion, knee extension 4-/5, ankle dorsiflexion 4+/5, stable Left lower extremity: Hip flexion, knee extension 4-/5, ankle dorsiflexion 4+/5, stable  Assessment/Plan: 1. Functional deficits secondary to debility due to septic knees, disciitis which require 3+ hours per day of interdisciplinary therapy in a comprehensive inpatient rehab setting.  Physiatrist is providing close team supervision and 24 hour management of active medical problems listed below.  Physiatrist and rehab team continue to assess barriers to discharge/monitor patient progress  toward functional and medical goals  Care Tool:  Bathing  Bathing activity did not occur: Refused Body parts bathed by patient: Right arm, Left arm, Chest, Abdomen, Right upper leg, Left upper leg, Face, Front perineal area   Body parts bathed by helper: Right lower leg, Left lower leg     Bathing assist Assist Level: Minimal Assistance - Patient > 75%     Upper Body Dressing/Undressing Upper body dressing   What is the patient wearing?: Pull over shirt, Orthosis    Upper body assist Assist Level: Minimal Assistance - Patient > 75%    Lower Body Dressing/Undressing Lower body dressing      What is the patient wearing?: Pants     Lower body assist Assist for lower body dressing: Minimal Assistance - Patient > 75%     Toileting Toileting Toileting Activity did not occur (Clothing management and hygiene only): N/A (no void or bm)  Toileting assist Assist for toileting: Minimal Assistance - Patient > 75%     Transfers Chair/bed transfer  Transfers assist     Chair/bed transfer assist level: Minimal Assistance - Patient > 75%     Locomotion Ambulation   Ambulation assist      Assist level: Minimal Assistance - Patient > 75% Assistive device: Walker-rolling Max distance: 73ft   Walk 10 feet activity   Assist     Assist level: Minimal Assistance - Patient > 75% Assistive device: Walker-rolling   Walk 50 feet activity   Assist Walk 50 feet with 2 turns activity did not occur: Safety/medical concerns  Walk 150 feet activity   Assist Walk 150 feet activity did not occur: Safety/medical concerns         Walk 10 feet on uneven surface  activity   Assist Walk 10 feet on uneven surfaces activity did not occur: Safety/medical concerns   Assist level: Minimal Assistance - Patient > 75% Assistive device: Aeronautical engineer Will patient use wheelchair at discharge?: Yes Type of Wheelchair: Manual     Wheelchair assist level: Supervision/Verbal cueing Max wheelchair distance: 80ft    Wheelchair 50 feet with 2 turns activity    Assist            Wheelchair 150 feet activity     Assist            Medical Problem List and Plan: 1.  Decreased functional mobility secondary to sepsis/Enterococcus faecalis bacteremia/discitis/osteomyelitis of L4-5 status post aortic root replacement 04/17/2020 status post cardioversion for atrial fibrillation.  Sternal precautions/lumbar corset for comfort x 4 weeks.  Continue CIR 2.  Antithrombotics: -DVT/anticoagulation: Resumed Eliquis 05/15/2020             -antiplatelet therapy: N/A 3. Pain Management: OxyContin sustained release 10 mg every 12 hours, Lyrica 100 mg 3 times daily Flexeril as as well as oxycodone needed  Relatively controlled on 7/11  Lidoderm patch ordered on 6/30, with improvement 4. Mood: N/A meds- follow for ego support             -antipsychotic agents: N/A 5. Neuropsych: This patient is capable of making decisions on his own behalf. 6. Skin/Wound Care: Routine skin checks 7. Fluids/Electrolytes/Nutrition: Routine in and outs. 8.  ID/Enterococcus bacteremia.  IV ampicillin 2 g every 4 hours as well as Rocephin 2 g every 12 hours completed on 6/30.  Follow-up infectious disease 9.  Acute on chronic anemia.  Continue iron supplement.    Hb 8.4 on 7/10  Cont to monitor 10.  Bilateral septic knees.  Status post total synovectomy irrigation debridement bilateral knees 05/02/2020 per Dr. Sharol Given.  Weightbearing as tolerated.   Continue ice to bilateral knees  11.  AKI.     Creatinine 1.44 on 7/7, labs ordered for tomorrow  Secondary to diuresis 12.  PAF.  Follow-up cardiology services.  Amiodarone as directed.    Continue Lopressor 12.5 mg twice daily.    Monitor with increased exertion  Repeat ECG reviewed, showing a flutter with prolonged QTC  Recommendations per cardiology  Will follow up with Cards regarding  Afib with RVR-consideration of DCCV as outpatient 13.  Diabetes mellitus with hyperglycemia.  Latest hemoglobin A1c 6.8.    Lantus insulin 10 units twice daily, decreased to 8 units on 7/6.   CBG (last 3)  Recent Labs    05/30/20 2100 05/31/20 0557 05/31/20 1148  GLUCAP 140* 161* 77    Labile on 7/11  Will consider oral medications based on kidney function  Monitor with increased mobility 14.  Recent C3-4 ACDF 03/02/2020 per [name] for cervical myelopathy.  Cervical collar discontinued 15.  Hypothyroidism.  Synthroid 16. Drug-induced constipation.  MiraLAX daily, Senokot nightly  Adjust bowel meds as necessary 17.  Severe hypoalbuminemia  Supplement initiated on 6/25  IV supplementation x3 completed 18.  Transaminitis: Resolved  LFTs within normal limits on 6/29 19.  Peripheral edema  See #12, #19  IV Lasix and IV albumin per cardiology, additional dose given on 6/29, transitioned to p.o. Lasix on 6/30, decreased on 7/6  Echo reviewed  from 6/9-showing normal EF  BNP elevated  Filed Weights   05/28/20 0334 05/29/20 0453 05/30/20 0408  Weight: 86.2 kg 85.2 kg 84.3 kg   Improving on 7/10 20.  Prolonged QTC  Monitor in conjunction with other medications  Appreciate cards recs 21.  Hypothyroidism  TSH elevated on 6/29 22.  Soft blood pressures   Vitals:   05/30/20 1933 05/31/20 0556  BP: 95/71 111/63  Pulse: 72 90  Resp: 16 16  Temp: 98.8 F (37.1 C) 98.5 F (36.9 C)  SpO2: 99% 99%   Secondary to diuresis, asymptomatic and?  Improving on 7/11 23.  Hypokalemia-secondary to diuretics  Potassium 4.2 on 7/7, labs ordered for tomorrow  Supplement increased on 7/1  LOS: 17 days A FACE TO FACE EVALUATION WAS PERFORMED  Johnathan Murray Lorie Phenix 05/31/2020, 2:51 PM

## 2020-05-31 NOTE — Progress Notes (Signed)
Occupational Therapy Session Note  Patient Details  Name: Johnathan Murray MRN: [id number] Date of Birth: 11-12-1951  Today's Date: 05/31/2020 OT Individual Time: 0902-1000 OT Individual Time Calculation (min): 58 min    Short Term Goals: Week 3:  OT Short Term Goal 1 (Week 3): STG = LTGs due to remaining LOS  Skilled Therapeutic Interventions/Progress Updates:  Patient met lying supine in bed in agreement with skilled OT treatment session with focus on functional mobility, functional transfers, activity tolerance, and safety. Supine to EOB transfer with supervision A and increased time. Patient prefers to place BUE on RW for STS despite education on safety and sternal precautions stating it's the only way to control his pain. Patient completed sit <> stand from various surfaces this date with Min A-Mod A using rocking technique. Therapeutic exercise with use of NuStep to facilitate increased flexion/extension of bilateral knees in prep for functional transfers with increased independence. Session concluded with patient lying supine in bed with call bell within reach, bed alarm activated, and all needs met.   Therapy Documentation Precautions:  Precautions Precautions: Fall Precaution Comments: freq cues to adhere to sternal prec Required Braces or Orthoses: Spinal Brace Spinal Brace: Lumbar corset Other Brace: for comfort Restrictions Weight Bearing Restrictions: No Other Position/Activity Restrictions: sternal precautions General:   Vital Signs: Therapy Vitals Temp: 98.5 F (36.9 C) Temp Source: Oral Pulse Rate: 90 Resp: 16 BP: 111/63 Patient Position (if appropriate): Lying Oxygen Therapy SpO2: 99 % O2 Device: Room Air Pain:    Therapy/Group: Individual Therapy  Nina Mondor R Howerton-Davis 05/31/2020, 9:40 AM

## 2020-06-01 ENCOUNTER — Inpatient Hospital Stay (HOSPITAL_COMMUNITY): Payer: BC Managed Care – PPO | Admitting: Physical Therapy

## 2020-06-01 ENCOUNTER — Inpatient Hospital Stay (HOSPITAL_COMMUNITY): Payer: BC Managed Care – PPO | Admitting: Occupational Therapy

## 2020-06-01 ENCOUNTER — Inpatient Hospital Stay (HOSPITAL_COMMUNITY): Payer: BC Managed Care – PPO

## 2020-06-01 LAB — BASIC METABOLIC PANEL
Anion gap: 11 (ref 5–15)
BUN: 33 mg/dL — ABNORMAL HIGH (ref 8–23)
CO2: 27 mmol/L (ref 22–32)
Calcium: 9.3 mg/dL (ref 8.9–10.3)
Chloride: 101 mmol/L (ref 98–111)
Creatinine, Ser: 1.43 mg/dL — ABNORMAL HIGH (ref 0.61–1.24)
GFR calc Af Amer: 58 mL/min — ABNORMAL LOW (ref >60)
GFR calc non Af Amer: 50 mL/min — ABNORMAL LOW (ref >60)
Glucose, Bld: 206 mg/dL — ABNORMAL HIGH (ref 70–99)
Potassium: 4 mmol/L (ref 3.5–5.1)
Sodium: 139 mmol/L (ref 135–145)

## 2020-06-01 LAB — CBC WITH DIFFERENTIAL/PLATELET
Abs Immature Granulocytes: 0.03 10*3/uL (ref 0.00–0.07)
Basophils Absolute: 0.1 10*3/uL (ref 0.0–0.1)
Basophils Relative: 1 %
Eosinophils Absolute: 0.3 10*3/uL (ref 0.0–0.5)
Eosinophils Relative: 5 %
HCT: 30.4 % — ABNORMAL LOW (ref 39.0–52.0)
Hemoglobin: 9 g/dL — ABNORMAL LOW (ref 13.0–17.0)
Immature Granulocytes: 0 %
Lymphocytes Relative: 13 %
Lymphs Abs: 0.9 10*3/uL (ref 0.7–4.0)
MCH: 27.3 pg (ref 26.0–34.0)
MCHC: 29.6 g/dL — ABNORMAL LOW (ref 30.0–36.0)
MCV: 92.1 fL (ref 80.0–100.0)
Monocytes Absolute: 0.5 10*3/uL (ref 0.1–1.0)
Monocytes Relative: 7 %
Neutro Abs: 5.1 10*3/uL (ref 1.7–7.7)
Neutrophils Relative %: 74 %
Platelets: 298 10*3/uL (ref 150–400)
RBC: 3.3 MIL/uL — ABNORMAL LOW (ref 4.22–5.81)
RDW: 17.7 % — ABNORMAL HIGH (ref 11.5–15.5)
WBC: 6.9 10*3/uL (ref 4.0–10.5)
nRBC: 0 % (ref 0.0–0.2)

## 2020-06-01 LAB — GLUCOSE, CAPILLARY
Glucose-Capillary: 134 mg/dL — ABNORMAL HIGH (ref 70–99)
Glucose-Capillary: 93 mg/dL (ref 70–99)
Glucose-Capillary: 114 mg/dL — ABNORMAL HIGH (ref 70–99)
Glucose-Capillary: 180 mg/dL — ABNORMAL HIGH (ref 70–99)

## 2020-06-01 NOTE — Progress Notes (Signed)
[place] PHYSICAL MEDICINE & REHABILITATION PROGRESS NOTE  Subjective/Complaints: No complaints this morning. Denies pain, constipation.  Patient asks when IV will be taken out- discussed with PA Linna Hoff and patient has been receiving Albumin from cardiology. Appreciate cardiology following.   ROS: Denies CP, SOB, N/V/D  Objective: Vital Signs: Blood pressure 99/69, pulse 63, temperature 98.6 F (37 C), resp. rate 17, height 5\' 9"  (1.753 m), weight 77.2 kg, SpO2 100 %. No results found. Recent Labs    05/31/20 0600 06/01/20 0749  WBC 6.6 6.9  HGB 8.7* 9.0*  HCT 29.5* 30.4*  PLT 308 298   Recent Labs    06/01/20 0809  NA 139  K 4.0  CL 101  CO2 27  GLUCOSE 206*  BUN 33*  CREATININE 1.43*  CALCIUM 9.3    Physical Exam: BP 99/69 (BP Location: Left Arm)   Pulse 63   Temp 98.6 F (37 C)   Resp 17   Ht 5\' 9"  (1.753 m)   Wt 77.2 kg   SpO2 100%   BMI 25.13 kg/m  General: Alert and oriented x 3, No apparent distress HEENT: Head is normocephalic, atraumatic, PERRLA, EOMI, sclera anicteric, oral mucosa pink and moist, dentition intact, ext ear canals clear,  Neck: Supple without JVD or lymphadenopathy Cardiovascular: No JVD.  Irregularly irregular. Respiratory: Normal effort.  No stridor.  Bilaterally clear to auscultation. GI: Non-distended.  BS +. Skin: Knee incisions healing. Psych: Normal mood.  Normal behavior. Musc: Bilateral knees with edema, stable. Prior TKA incisions Neuro: Alert Motor:  Right lower extremity: Hip flexion, knee extension 4-/5, ankle dorsiflexion 4+/5, stable Left lower extremity: Hip flexion, knee extension 4-/5, ankle dorsiflexion 4+/5, stable    Assessment/Plan: 1. Functional deficits secondary to debility due to septic knees, disciitis which require 3+ hours per day of interdisciplinary therapy in a comprehensive inpatient rehab setting.  Physiatrist is providing close team supervision and 24 hour management of active medical  problems listed below.  Physiatrist and rehab team continue to assess barriers to discharge/monitor patient progress toward functional and medical goals  Care Tool:  Bathing  Bathing activity did not occur: Refused Body parts bathed by patient: Right arm, Left arm, Chest, Abdomen, Right upper leg, Left upper leg, Face, Front perineal area   Body parts bathed by helper: Right lower leg, Left lower leg     Bathing assist Assist Level: Minimal Assistance - Patient > 75%     Upper Body Dressing/Undressing Upper body dressing   What is the patient wearing?: Pull over shirt, Orthosis    Upper body assist Assist Level: Minimal Assistance - Patient > 75%    Lower Body Dressing/Undressing Lower body dressing      What is the patient wearing?: Pants     Lower body assist Assist for lower body dressing: Minimal Assistance - Patient > 75%     Toileting Toileting Toileting Activity did not occur (Clothing management and hygiene only): N/A (no void or bm)  Toileting assist Assist for toileting: Minimal Assistance - Patient > 75%     Transfers Chair/bed transfer  Transfers assist     Chair/bed transfer assist level: Minimal Assistance - Patient > 75%     Locomotion Ambulation   Ambulation assist      Assist level: Minimal Assistance - Patient > 75% Assistive device: Walker-rolling Max distance: 63ft   Walk 10 feet activity   Assist     Assist level: Minimal Assistance - Patient > 75% Assistive device: Walker-rolling  Walk 50 feet activity   Assist Walk 50 feet with 2 turns activity did not occur: Safety/medical concerns         Walk 150 feet activity   Assist Walk 150 feet activity did not occur: Safety/medical concerns         Walk 10 feet on uneven surface  activity   Assist Walk 10 feet on uneven surfaces activity did not occur: Safety/medical concerns   Assist level: Minimal Assistance - Patient > 75% Assistive device: Horticulturist, commercial Will patient use wheelchair at discharge?: Yes Type of Wheelchair: Manual    Wheelchair assist level: Supervision/Verbal cueing Max wheelchair distance: 51ft    Wheelchair 50 feet with 2 turns activity    Assist            Wheelchair 150 feet activity     Assist            Medical Problem List and Plan: 1.  Decreased functional mobility secondary to sepsis/Enterococcus faecalis bacteremia/discitis/osteomyelitis of L4-5 status post aortic root replacement 04/17/2020 status post cardioversion for atrial fibrillation.  Sternal precautions/lumbar corset for comfort x 4 weeks.  Continue CIR 2.  Antithrombotics: -DVT/anticoagulation: Resumed Eliquis 05/15/2020             -antiplatelet therapy: N/A 3. Pain Management: OxyContin sustained release 10 mg every 12 hours, Lyrica 100 mg 3 times daily Flexeril as as well as oxycodone needed  Well controlled 7/12.   Lidoderm patch ordered on 6/30, with improvement 4. Mood: N/A meds- follow for ego support             -antipsychotic agents: N/A 5. Neuropsych: This patient is capable of making decisions on his own behalf. 6. Skin/Wound Care: Routine skin checks 7. Fluids/Electrolytes/Nutrition: Routine in and outs. 8.  ID/Enterococcus bacteremia.  IV ampicillin 2 g every 4 hours as well as Rocephin 2 g every 12 hours completed on 6/30.  Follow-up infectious disease 9.  Acute on chronic anemia.  Continue iron supplement.    Hb 8.4 on 7/10, 9.0 on 7/12.   Cont to monitor 10.  Bilateral septic knees.  Status post total synovectomy irrigation debridement bilateral knees 05/02/2020 per Dr. Sharol Given.  Weightbearing as tolerated.   Continue ice to bilateral knees  11.  AKI.     Creatinine 1.44 on 7/7, 1.42 on 7/12.  Secondary to diuresis 12.  PAF.  Follow-up cardiology services.  Amiodarone as directed.    Continue Lopressor 12.5 mg twice daily.    Monitor with increased exertion  Repeat ECG reviewed, showing  a flutter with prolonged QTC  Recommendations per cardiology  Will follow up with Cards regarding Afib with RVR-consideration of DCCV as outpatient 13.  Diabetes mellitus with hyperglycemia.  Latest hemoglobin A1c 6.8.    Lantus insulin 10 units twice daily, decreased to 8 units on 7/6.   CBG (last 3)  Recent Labs    05/31/20 1700 05/31/20 2014 06/01/20 0623  GLUCAP 124* 140* 134*    Well controlled 7/12  Will consider oral medications based on kidney function  Monitor with increased mobility 14.  Recent C3-4 ACDF 03/02/2020 per [name] for cervical myelopathy.  Cervical collar discontinued 15.  Hypothyroidism.  Synthroid 16. Drug-induced constipation.  MiraLAX daily, Senokot nightly  Adjust bowel meds as necessary 17.  Severe hypoalbuminemia  Supplement initiated on 6/25  IV supplementation x3 completed 18.  Transaminitis: Resolved  LFTs within normal limits on 6/29 19.  Peripheral  edema  See #12, #19  IV Lasix and IV albumin per cardiology, additional dose given on 6/29, transitioned to p.o. Lasix on 6/30, decreased on 7/6  Echo reviewed from 6/9-showing normal EF  BNP elevated  Filed Weights   05/29/20 0453 05/30/20 0408 06/01/20 0600  Weight: 85.2 kg 84.3 kg 77.2 kg   Improving on 7/10 20.  Prolonged QTC  Monitor in conjunction with other medications  Appreciate cards recs 21.  Hypothyroidism  TSH elevated on 6/29 22.  Soft blood pressures   Vitals:   05/31/20 2000 06/01/20 0547  BP:  99/69  Pulse: 68 63  Resp: 17 17  Temp:  98.6 F (37 C)  SpO2: 100%    Secondary to diuresis, asymptomatic and?  Improving on 7/11 23.  Hypokalemia-secondary to diuretics  Potassium 4.2 on 7/7, 4.0 on 7/12  Supplement increased on 7/1  LOS: 18 days A FACE TO FACE EVALUATION WAS PERFORMED  Clide Deutscher Evella Kasal 06/01/2020, 9:40 AM

## 2020-06-01 NOTE — Progress Notes (Signed)
Progress Note  Patient Name: Johnathan Murray Date of Encounter: 06/01/2020  [facility] Cardiologist: Jenean Lindau, MD   Subjective   Doing well no palpitations ? D/c from rehab Thursday  He is depressed about being in hospital so long and food is lousy   Inpatient Medications    Scheduled Meds: . amiodarone  200 mg Oral Daily  . amoxicillin  500 mg Oral Q12H  . apixaban  5 mg Oral BID  . feeding supplement (ENSURE ENLIVE)  237 mL Oral TID BM  . feeding supplement (PRO-STAT SUGAR FREE 64)  30 mL Oral BID  . ferrous [id number] C-folic acid  1 capsule Oral Q breakfast  . ferrous sulfate  325 mg Oral BID WC  . furosemide  60 mg Oral Daily  . hydrocortisone cream   Topical TID  . insulin aspart  0-20 Units Subcutaneous TID WC  . insulin glargine  8 Units Subcutaneous BID  . levothyroxine  25 mcg Oral QAC breakfast  . lidocaine  2 patch Transdermal Q24H  . metoprolol tartrate  12.5 mg Oral BID  . oxyCODONE  10 mg Oral Q12H  . polyethylene glycol  17 g Oral Daily  . potassium chloride  20 mEq Oral BID  . pregabalin  100 mg Oral TID  . senna  1 tablet Oral QHS   Continuous Infusions:  PRN Meds: acetaminophen **OR** acetaminophen, albuterol, cyclobenzaprine, oxyCODONE, sodium chloride flush, sorbitol   Vital Signs    Vitals:   05/31/20 1957 05/31/20 2000 06/01/20 0547 06/01/20 0600  BP: 103/87  99/69   Pulse:  68 63   Resp:  17 17   Temp: 98.6 F (37 C)  98.6 F (37 C)   TempSrc: Oral     SpO2:  100%    Weight:    77.2 kg  Height:        Intake/Output Summary (Last 24 hours) at 06/01/2020 0821 Last data filed at 06/01/2020 0551 Gross per 24 hour  Intake 240 ml  Output 2050 ml  Net -1810 ml   Last 3 Weights 06/01/2020 05/30/2020 05/29/2020  Weight (lbs) 170 lb 3.1 oz 185 lb 13.6 oz 187 lb 13.3 oz  Weight (kg) 77.2 kg 84.3 kg 85.2 kg      Physical Exam   Affect appropriate Chronically ill white male  HEENT: normal Neck supple with no  adenopathy JVP normal no bruits no thyromegaly Lungs clear with no wheezing and good diaphragmatic motion Heart:  S1/S2  SEM  murmur, no rub, gallop or click PMI normal post sternotomy  Abdomen: benighn, BS positve, no tenderness, no AAA no bruit.  No HSM or HJR Distal pulses intact with no bruits No edema Neuro non-focal Skin warm and dry Post bilateral TKR;s with effusions and edema    Labs    High Sensitivity Troponin:  No results for input(s): TROPONINIHS in the last 720 hours.    Chemistry Recent Labs  Lab 05/26/20 0554 05/27/20 0550  NA 137 137  K 4.2 4.2  CL 100 100  CO2 27 28  GLUCOSE 110* 117*  BUN 28* 30*  CREATININE 1.51* 1.44*  CALCIUM 9.1 9.2  GFRNONAA 47* 50*  GFRAA 54* 57*  ANIONGAP 10 9     Hematology Recent Labs  Lab 05/30/20 0624 05/31/20 0600 06/01/20 0749  WBC 6.4 6.6 6.9  RBC 3.10* 3.19* 3.30*  HGB 8.4* 8.7* 9.0*  HCT 28.4* 29.5* 30.4*  MCV 91.6 92.5 92.1  MCH 27.1 27.3 27.3  MCHC 29.6* 29.5* 29.6*  RDW 17.7* 17.4* 17.7*  PLT 311 308 298    BNPNo results for input(s): BNP, PROBNP in the last 168 hours.   DDimer No results for input(s): DDIMER in the last 168 hours.   Radiology    No results found.  Cardiac Studies   EF normal  Patient Profile     69 y.o. 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 now s/p debridement of aortic root abscess and aortic root replacement on 04/16/2020, paroxysmal atrial fibrillation on Eliquis,recurrent bilateral knee effusionsand septic arthritis with arthrocentesis (6/10),hypertension, dyslipidemia, diabetes on insulin, hypothyroidism, lumbar stenosis with neurogenic claudication, and cervical decompression in 3/2021and a fib in rehab and back to main hospital at [facility] for amiodarone and thought to repeat DCCV in future if  a fib persists and infection improves (this was written 02/05/20)now with volume overload.  Assessment & Plan    Persistent atrial fibrillation -Failed cardioversion in the setting of infection. -continue low dose metoprolol and amidarone  - consider outpatient [facility] in 3 weeks after infectious / medical issues resolved  Diastolic heart failure -Continue with Lasix 60 mg a day.  This is slightly increased from before.  Protein. Creat stable. OK to continue.   Chronic anticoagulation -Eliquis.  Has had difficulty with bleeding requiring transfusions previously.  Monitoring. HGB stable, no changes  Recurrent septic knee/osteomyelitis of L4-L5/aortic root abscess -Antibiotics PICC line.  Aortic valve replacement -Currently stable.  Has had endocarditis.  Dental prophylaxis.No AR murmur   CAD post CABG -Stable no angina.  2 out of 3 grafts are occluded. No changes  Have notified [place] office of need for f/u visit post hospital to arrange [facility] will sign off   For questions or updates, please contact [place] Please consult [email or web address] for contact info under        Signed, Jenkins Rouge, MD  06/01/2020, 8:21 AM

## 2020-06-01 NOTE — Progress Notes (Signed)
Occupational Therapy Session Note  Patient Details  Name: Johnathan Murray MRN: [id number] Date of Birth: 1951-07-26  Today's Date: 06/01/2020 OT Individual Time: 7622-6333 OT Individual Time Calculation (min): 60 min    Short Term Goals: Week 3:  OT Short Term Goal 1 (Week 3): STG = LTGs due to remaining LOS  Skilled Therapeutic Interventions/Progress Updates:    Treatment session with focus on self-care retraining with toilet transfers and bathing at shower level.  Pt received supine in bed reporting need to toilet.  Completed bed mobility with supervision and sit > stand from elevated EOB with CGA with RW.  Ambulated to toilet with CGA with RW and cues for sequencing when navigating threshold into bathroom.  Pt completed clothing management with CGA for standing balance and able to complete hygiene in sitting with lateral leans.  Ambulated to shower seat in room shower with CGA.  Pt completed bathing at seated level with setup assist, having washed buttocks post toileting.  Engaged in dressing from EOB with pt able to Heizer legs in to figure 4 position to thread underwear and shorts with min assist.  Pt able to don shoes in figure 4 without assistance, however did require assistance to don socks due to decreased reach in figure 4.  Completed all sit > stand throughout session at overall CGA, except Min assist to stand from shower seat due to fatigue and height of seat.  Pt returned to semi-reclined in bed with assistance to lift BLE in to bed.  Discussed recommended DME and plan to further follow up with SWK and Case Manager due to need for elongated BSC due to anatomy.  Pt remained semi-reclined in bed with all needs in reach.  Therapy Documentation Precautions:  Precautions Precautions: Fall Precaution Comments: freq cues to adhere to sternal prec Required Braces or Orthoses: Spinal Brace Spinal Brace: Lumbar corset Other Brace: for comfort Restrictions Weight Bearing Restrictions: No  Other Position/Activity Restrictions: sternal precautions Pain:  Pt with c/o pain in B knees and lower back, unrated.  Premedicated, shower, and repositioned.   Therapy/Group: Individual Therapy  Simonne Come 06/01/2020, 9:54 AM

## 2020-06-01 NOTE — Progress Notes (Signed)
Physical Therapy Session Note  Patient Details  Name: Johnathan Murray MRN: [id number] Date of Birth: 1951/04/15  Today's Date: 06/01/2020 PT Individual Time: 1330-1429 Total PT minutes: 59 min  Short Term Goals: Week 3:  PT Short Term Goal 1 (Week 3): STG=LTG due to LOS  Skilled Therapeutic Interventions/Progress Updates:     Pt received supine in bed and agreeable to therapy. Supine to sit at mod(I) with use of bedrails and elevated bed. Pt performs stand pivot to WC with CGA and cues for hand placement. When pt attempts to reach back for WC to use arms to assist with eccentric control of sitting, pt experiences exacerbation of back symptoms. PT provides extended rest break to manage pain symptoms.  Pt performs step ups in // bars for stair training exercise. Using 5" step, pt performs 3 sets of x7-9 steps ups using BUE support on // bars. Pt uses LLE to step up and RLE to step down. Extended seated rest breaks between each set. Supervision provided for safety and verbal cues on technique and foot placement.  Pt then performs step ups laterally, facing mirror for visual feedback and with PT providing minA. Pt completes x2 steps each bout and requires rest breaks.  Pt ambulates 26' and 110' with close supervision and RW. Cues for upright gaze and decreased WB through RW for energy conservation.  Pt performs sit to supine with minA at [place]. Left supine in bed with alarm intact and all needs within reach.   Therapy Documentation Precautions:  Precautions Precautions: Fall Precaution Comments: freq cues to adhere to sternal prec Required Braces or Orthoses: Spinal Brace Spinal Brace: Lumbar corset Other Brace: for comfort Restrictions Weight Bearing Restrictions: No Other Position/Activity Restrictions: sternal precautions   Therapy/Group: Individual Therapy  Breck Coons, PT, DPT 06/01/2020, 3:51 PM

## 2020-06-01 NOTE — Progress Notes (Signed)
Physical Therapy Session Note  Patient Details  Name: Johnathan Murray MRN: [id number] Date of Birth: December 19, 1950  Today's Date: 06/01/2020 PT Individual Time: 3361-2244 PT Individual Time Calculation (min): 32 min   Short Term Goals: Week 3:  PT Short Term Goal 1 (Week 3): STG=LTG due to LOS  Skilled Therapeutic Interventions/Progress Updates: Pt presented in bed with wife present agreeable to therapy. Pt states mild pain in low back but does not rate. Pt performed bed mobility with use of bed features with supervision. Pt ambulated to nsg station then requesting to sit due to fatigue. Pt self propelled w/c remaining distance to NuStep. Remaining session focused on use of NuStep for increasing B knee ROM to improve functional transfers. Performed ambulatory transfer to NuStep and participated for a total of 9 minutes at level 1. Pt performed 3 min ea at seat 8, 7, and 6 respectively. Pt able to use BUE to control stretch. PTA encouraged pt to use ice after session for pain management and explained importance of maintaining ROM with pt and wife verbalizing understanding. While performing NuStep pt indicated liked height of standard chair in day room which he used for rest break a few days prior. PTA and wife checked height approx 19in, discussed options for raising couch height upon d/c (placing quilts under cushion, etc). Pt requesting to trial sitting in standard chair in room. Upon completion pt propelled back to room and transferred to standard chair. Pt left in chair at end of session with wife present (wife signed off for ambulation and transfers in room) and left with needs met.      Therapy Documentation Precautions:  Precautions Precautions: Fall Precaution Comments: freq cues to adhere to sternal prec Required Braces or Orthoses: Spinal Brace Spinal Brace: Lumbar corset Other Brace: for comfort Restrictions Weight Bearing Restrictions: No Other Position/Activity Restrictions:  sternal precautions General:   Vital Signs: Therapy Vitals Temp: 98.2 F (36.8 C) Pulse Rate: 78 Resp: 17 BP: 97/67 Patient Position (if appropriate): Sitting Oxygen Therapy SpO2: 96 % O2 Device: Room Air Pain:   Mobility:   Locomotion :    Trunk/Postural Assessment :    Balance:   Exercises:   Other Treatments:      Therapy/Group: Individual Therapy  Gaylene Moylan 06/01/2020, 4:37 PM

## 2020-06-01 NOTE — Progress Notes (Signed)
Occupational Therapy Session Note  Patient Details  Name: Afshin Chrystal MRN: [id number] Date of Birth: 1950-11-27  Today's Date: 06/01/2020 OT Individual Time: 1000-1054 OT Individual Time Calculation (min): 54 min    Short Term Goals: Week 3:  OT Short Term Goal 1 (Week 3): STG = LTGs due to remaining LOS  Skilled Therapeutic Interventions/Progress Updates:    Upon entering the room, pt supine in be with no c/o pain and daughter present in the room. Pt is agreeable to OT intervention. Bed mobility performed with supervision and use of figure four position to don B shoes.Stand pivot transfer from bed >wheelchair with CGA. Pt being cued for safety and sternal precautions with transfer and pt reports, " I'm doing what works for me". Pt propelled wheelchair with B feet to ADL apartment with supervision and encouragement. Pt demonstrated short distance ambulation of 10' with RW and transfer onto TTB placed into tub shower combination with min guard. Pt able to get B LEs over ledge and into shower on his own this session. Pt exited the bathroom in the same manner. OT assisted pt to day room for therapeutic intervention with pt standing at high low table for card task with one UE supported. Pt standing for 5 minutes an then reports increase in back pain and returns to wheelchair. OT assists pt back to room and pt demonstrated min guard stand pivot transfer back to bed and sit >supine with supervision. Call bell and all needed items within reach upon exiting the room.   Therapy Documentation Precautions:  Precautions Precautions: Fall Precaution Comments: freq cues to adhere to sternal prec Required Braces or Orthoses: Spinal Brace Spinal Brace: Lumbar corset Other Brace: for comfort Restrictions Weight Bearing Restrictions: No Other Position/Activity Restrictions: sternal precautions    ADL: ADL Grooming: Supervision/safety Where Assessed-Grooming: Edge of bed Upper Body Bathing:  Supervision/safety Where Assessed-Upper Body Bathing: Edge of bed Lower Body Bathing: Moderate assistance (+2 for sit > stand) Where Assessed-Lower Body Bathing: Edge of bed Upper Body Dressing: Minimal assistance (hospital gown due to PICC line) Where Assessed-Upper Body Dressing: Edge of bed Lower Body Dressing: Dependent (Mod A +2 for sit > stand) Where Assessed-Lower Body Dressing: Edge of bed (sit to stand level) Vision   Perception    Praxis   Exercises:   Other Treatments:     Therapy/Group: Individual Therapy  Gypsy Decant 06/01/2020, 10:59 AM

## 2020-06-02 ENCOUNTER — Inpatient Hospital Stay (HOSPITAL_COMMUNITY): Payer: BC Managed Care – PPO

## 2020-06-02 ENCOUNTER — Inpatient Hospital Stay (HOSPITAL_COMMUNITY): Payer: BC Managed Care – PPO | Admitting: Occupational Therapy

## 2020-06-02 ENCOUNTER — Inpatient Hospital Stay (HOSPITAL_COMMUNITY): Payer: BC Managed Care – PPO | Admitting: Physical Therapy

## 2020-06-02 DIAGNOSIS — I952 Hypotension due to drugs: Secondary | ICD-10-CM

## 2020-06-02 LAB — CBC WITH DIFFERENTIAL/PLATELET
Abs Immature Granulocytes: 0.04 10*3/uL (ref 0.00–0.07)
Basophils Absolute: 0.1 10*3/uL (ref 0.0–0.1)
Basophils Relative: 1 %
Eosinophils Absolute: 0.2 10*3/uL (ref 0.0–0.5)
Eosinophils Relative: 4 %
HCT: 31.7 % — ABNORMAL LOW (ref 39.0–52.0)
Hemoglobin: 9.3 g/dL — ABNORMAL LOW (ref 13.0–17.0)
Immature Granulocytes: 1 %
Lymphocytes Relative: 12 %
Lymphs Abs: 0.8 10*3/uL (ref 0.7–4.0)
MCH: 27.3 pg (ref 26.0–34.0)
MCHC: 29.3 g/dL — ABNORMAL LOW (ref 30.0–36.0)
MCV: 93 fL (ref 80.0–100.0)
Monocytes Absolute: 0.5 10*3/uL (ref 0.1–1.0)
Monocytes Relative: 7 %
Neutro Abs: 5.2 10*3/uL (ref 1.7–7.7)
Neutrophils Relative %: 75 %
Platelets: 280 10*3/uL (ref 150–400)
RBC: 3.41 MIL/uL — ABNORMAL LOW (ref 4.22–5.81)
RDW: 17.7 % — ABNORMAL HIGH (ref 11.5–15.5)
WBC: 6.9 10*3/uL (ref 4.0–10.5)
nRBC: 0 % (ref 0.0–0.2)

## 2020-06-02 LAB — GLUCOSE, CAPILLARY
Glucose-Capillary: 199 mg/dL — ABNORMAL HIGH (ref 70–99)
Glucose-Capillary: 80 mg/dL (ref 70–99)
Glucose-Capillary: 152 mg/dL — ABNORMAL HIGH (ref 70–99)
Glucose-Capillary: 150 mg/dL — ABNORMAL HIGH (ref 70–99)

## 2020-06-02 NOTE — Progress Notes (Signed)
Patient ID: Johnathan Murray, male   DOB: May 25, 1951, 69 y.o.   MRN: [id number]  Patient declined by [facility] due to services area

## 2020-06-02 NOTE — Progress Notes (Signed)
Physical Therapy Session Note  Patient Details  Name: Johnathan Murray MRN: [id number] Date of Birth: Jul 09, 1951  Today's Date: 06/02/2020 PT Individual Time: 9166-0600 PT Individual Time Calculation (min): 55 min   Short Term Goals: Week 3:  PT Short Term Goal 1 (Week 3): STG=LTG due to LOS  Skilled Therapeutic Interventions/Progress Updates:     Pt received supine and agreeable to therapy. Reports pain in low back. Number not provided. PT provides rest breaks as needed to manage pain symptoms. Supine to sit with supervision and cues for logrolling technique. Stand pivot to WC with RW and CGA.   Pt performs multiple WC<>car transfers. Initially performs with PT providing minA and use of RW, with cues for positioning and hand placement. Pt then performs with wife providing CGA and PT cuing wife on safe guarding techniques.  Pt performs x4 steps with BHRs and minA at hips for weight shifting and stability. Extended seated rest break and pt perform additional x4 steps. Additional verbal cues for hand placement and body mechanics for optimal ease, safety, and comfort of sit to stand transfers.  Pt ambulates 110' with RW and supervision, with cues for upright gaze to improve posture and balance. Sit to supine with minA and PT managing BLEs. Pt left supine in bed with all needs within reach.  Therapy Documentation Precautions:  Precautions Precautions: Fall Precaution Comments: freq cues to adhere to sternal prec Required Braces or Orthoses: Spinal Brace Spinal Brace: Lumbar corset Other Brace: for comfort Restrictions Weight Bearing Restrictions: No Other Position/Activity Restrictions: sternal precautions    Therapy/Group: Individual Therapy  Breck Coons, PT, DPT 06/02/2020, 3:31 PM

## 2020-06-02 NOTE — Progress Notes (Signed)
Physical Therapy Session Note  Patient Details  Name: Johnathan Murray MRN: [id number] Date of Birth: 12/26/1950  Today's Date: 06/02/2020 PT Individual Time: 1105-1205 PT Individual Time Calculation (min): 60 min   Short Term Goals: Week 3:  PT Short Term Goal 1 (Week 3): STG=LTG due to LOS  Skilled Therapeutic Interventions/Progress Updates: Pt presented in bed with wife present agreeable to therapy. Pt states recently received pain meds due to back pain. Performed therapy to tolerance and provided rest breaks as needed for pain management. Session focused on bed mobility and functional transfers in preparation for home d/c. Pt demonstrated supine to/from sit from lowest level bed flat without rails with supervision. Pt expressed that he plans on sleeping on home bed 33in upon d/c. Pt's wife indicated does have aerobic step which pt could use to assist getting onto bed. PTA obtained step and raised bed to 33in. Pt performd blocked practice of stepping off/onto 4in step and performing sit to/from supine with increased time and supervision. Pt and wife pleased with set up. Pt then ambulated partial distance then transported remaining distance to ADL where pt performed same activity (without step) onto standard bed. Pt discussed progressing STS from lower surfaces and recommended pushing from chair vs using RW. Pt ambulated to rehab gym supervision A and performed STS from 20in mat, 19in chair and 18in w/c. Pt was unable to perform STS from w/c UNLESS pushing up from arm rests. Pt verbalized understanding of benefits of arm rests however limited carryover with additional transfers. Pt propelled self back to room and performed ambulatory transfer to bed (placing hands on RW). Pt returnred to bed at end of session and left with call bell within reach and needs met.      Therapy Documentation Precautions:  Precautions Precautions: Fall Precaution Comments: freq cues to adhere to sternal  prec Required Braces or Orthoses: Spinal Brace Spinal Brace: Lumbar corset Other Brace: for comfort Restrictions Weight Bearing Restrictions: No Other Position/Activity Restrictions: sternal precautions General:   Vital Signs: Therapy Vitals Temp: 98.4 F (36.9 C) Pulse Rate: 82 Resp: 16 BP: (!) 87/62 Patient Position (if appropriate): Lying Oxygen Therapy SpO2: 96 % O2 Device: Room Air Pain:   Mobility:   Locomotion :    Trunk/Postural Assessment :    Balance:   Exercises:   Other Treatments:      Therapy/Group: Individual Therapy  Rosita DeChalus 06/02/2020, 4:30 PM  

## 2020-06-02 NOTE — Discharge Summary (Addendum)
Physician Discharge Summary  Patient ID: Johnathan Murray MRN: [id number] DOB/AGE: 68-May-1952 68 y.o.  Admit date: 05/14/2020 Discharge date: 06/04/2020  Discharge Diagnoses:  Principal Problem:   Debility Active Problems:   Diabetes mellitus due to underlying condition with unspecified complications (HCC)   PAF (paroxysmal atrial fibrillation) (HCC)   S/P AVR   Bacteremia due to Enterococcus   Septic arthritis of knee, bilateral (HCC)   Moderate protein-calorie malnutrition (HCC)   Discitis   Pressure injury of skin   Postoperative pain   Transaminitis   Hypoalbuminemia due to protein-calorie malnutrition ([place])   Drug induced constipation   Controlled type 2 diabetes mellitus with hyperglycemia, with long-term current use of insulin (HCC)   AKI (acute kidney injury) ([place])   History of prolonged Q-T interval on ECG   Peripheral edema   Labile blood glucose   Atrial fibrillation with RVR ([place])   Anasarca   Hypokalemia   Hypotension due to hypovolemia   Hypotension due to drugs   Drug-induced hypotension   Discharged Condition: Stable  Significant Diagnostic Studies: DG Knee 1-2 Views Right  Result Date: 05/09/2020 CLINICAL DATA:  Swelling and pain. Recurrent bilateral septic arthritis. EXAM: RIGHT KNEE - 1-2 VIEW COMPARISON:  None. FINDINGS: Anterior skin staples are identified. Suggested bony erosion is seen along the lateral tibial plateau, apparently present on the MRI from April 30, 2020 but not appreciated on the x-ray from Mar 21, 2020. A similar but smaller region of possible erosion is seen along the medial tibial plateau, unchanged since the Mar 21, 2020 x-ray and the comparison MRI. Possible mild erosion along the lateral femoral epicondyle, possibly on the recent MRI as well. No other sites of erosion are identified. No fractures. A large suprapatellar joint effusion is identified, containing foci of air. Anterior soft tissue swelling is seen, associated with the skin  staples. No other abnormalities. IMPRESSION: 1. Apparent bony erosion along the medial tibial plateau, not seen on the Mar 21, 2020 radiograph but apparently present on the April 30, 2020 MRI. Possible subtle erosion along the distal lateral femoral epicondyle as well, also possibly on the recent MRI. A small region of possible erosion along the medial tibial plateau is unchanged since Mar 21, 2020. These findings suggest osteomyelitis and are consistent with the recent known diagnosis of bilateral septic knees. The patient has undergone recent surgery for septic knees. Recommend clinical correlation and attention on follow-up. 2. Large joint effusion containing foci of gas. These findings may simply be due to recent surgery. It would be difficult to exclude a superimposed infection on today's imaging. Electronically Signed   By: Dorise Bullion III M.D   On: 05/09/2020 12:15    Labs:  Basic Metabolic Panel: Recent Labs  Lab 06/01/20 0809  NA 139  K 4.0  CL 101  CO2 27  GLUCOSE 206*  BUN 33*  CREATININE 1.43*  CALCIUM 9.3    CBC: Recent Labs  Lab 06/01/20 0749 06/02/20 0559 06/03/20 0714  WBC 6.9 6.9 6.7  NEUTROABS 5.1 5.2 4.6  HGB 9.0* 9.3* 9.4*  HCT 30.4* 31.7* 31.7*  MCV 92.1 93.0 91.9  PLT 298 280 289    CBG: Recent Labs  Lab 06/02/20 2124 06/03/20 0613 06/03/20 1138 06/03/20 1636 06/03/20 2055  GLUCAP 150* 129* 124* 158* 112*   Family history.  Brother with prostate cancer.  Maternal grandmother with CAD.  Mother with brain cancer.  Denies any diabetes mellitus esophageal cancer or rectal cancer  Brief HPI:  Johnathan Murray is a 69 y.o. right-handed male with history of diabetes mellitus, PAF maintained on amiodarone as well as Eliquis, hypertension, bioprosthetic aortic valve replacement 2017, lumbar laminectomy decompression 06/11/2019 as well as recent C3-4 anterior cervical discectomy and interbody fusion for cervical stenosis myelopathy 03/02/2020 per [name].   Per chart review lives with spouse 1 level home 7 steps to entry.  Used a rolling walker prior to admission.  Patient initially presented 03/19/2020 with low-grade fever and weakness of lower extremities.  Lactic acid 1.6 sodium 130 BUN 98 creatinine 6.40 with latest creatinine 1.6345 2021, WBC 12,600 CK of 20 as well as LFTs markedly elevated.  CT of abdomen pelvis as well as CT cervical spine unremarkable.  MRI cervical spine negative for infection.  Patient was hypotensive received IV fluid bolus.  Placed on broad-spectrum antibiotics for suspected sepsis.  MRI lumbar spine findings consistent with acute osteomyelitis discitis L4-5.  Evidence for associated soft tissue infection cellulitis within the psoas musculature bilaterally with a few small superimposed abscesses.  Additionally multilevel degenerative spondylosis most pronounced at L3-4.  Neurosurgery consulted no plan for surgical intervention he did undergo aspiration of disc space L4-5.  TEE showed no obvious vegetation and follow-up cardiology services.  Orthopedic services consulted Dr. Mardelle Matte for bilateral knee pain with noted swelling the left knee x-ray showed diffuse degenerative changes as well as large joint effusion.  Doppler showed ruptured Baker's cyst on left lower extremity.  Patient underwent left lower extremity joint aspiration 03/21/2020 and right lower extremity joint aspiration 03/22/2020 both showing intracellular calcium pyrophosphate crystals WBC was up to 15,000 culture grew out E. Faecalis.  Patient did receive steroid injection of both knees as well as underwent I&D bilateral knees.  Infectious disease consulted continue to follow maintained on intravenous ampicillin 7 to 8 weeks through 05/20/2020 PICC line was placed 03/29/2020.  AKI on CKD stage III continue to improve multifactorial from bacteremia latest creatinine 1.39.  Normocytic anemia postoperative patient transfused 2 units packed red blood cells.  He was admitted to inpatient  rehab services 04/07/2020 for ongoing comprehensive rehab therapies with slow progress bouts of tachycardia followed by cardiology services his amiodarone had since been resumed now that his renal function had improved.  His Eliquis was ongoing as prior to admission.  Patient with persistent bouts of atrial fibrillation with RVR underwent TEE/DCCV 04/29/2020 that showed bioprosthetic aortic valve well-seated without perivalvular regurgitation.  No LA/LAA thrombus or masses was initially placed on IV amiodarone transition to p.o. amiodarone as well as low-dose beta-blocker.  Ceftriaxone ampicillin ongoing again followed by infectious disease.  Therapy evaluations completed he was admitted to inpatient rehab services 04/30/2020 for comprehensive rehab.  On 05/01/2020 reports of ongoing left knee pain follow-up orthopedic services aspiration completed of knee demonstrated more than 100,000 white blood cells positive crystals.  Fluid was not able to be obtained from right knee and due to these findings suspect bilateral septic knees he was transferred again back to acute services 05/02/2020 undergoing total synovectomy irrigation debridement of bilateral knees application of wound VAC 05/02/2020 per Dr. Sharol Given that had since been removed.  Patient with scheduled pain management as advised.  His Eliquis has been resumed.  Patient was admitted for a comprehensive rehab program.   Hospital Course: Johnathan Murray was admitted to rehab 05/14/2020 for inpatient therapies to consist of PT, ST and OT at least three hours five days a week. Past admission physiatrist, therapy team and rehab RN have worked together  to provide customized collaborative inpatient rehab.  Pertaining to patient's decreased functional ability secondary to sepsis Enterococcus/faecalis bacteremia discitis osteomyelitis of L4-5 status post aortic root replacement 04/17/2020 status post cardioversion for atrial fibrillation.  Sternal precautions lumbar corset  for comfort x4 weeks.  Patient would follow-up with cardiology services as well as neurosurgery.  His chronic Eliquis has been resumed no bleeding episodes.  Chronic pain management as advised a Lidoderm patch and also been maintained to his regimen.  Infectious disease follow-up for Enterococcus bacteremia intravenous ampicillin completed 05/20/2020 he remained afebrile he would follow-up with infectious disease and initiated amoxil 500 mg every 12 hour as per ID.Marland Kitchen  Acute on chronic anemia latest hemoglobin 9.0 no bleeding episodes.  Bilateral septic knee status post total synovectomy irrigation debridement bilateral knees 05/02/2020 follow-up orthopedic services as advised neurovascular sensation intact.  AKI on CKD stage III latest creatinine 1.42 and stable.  Patient followed intensely by cardiology service for PAF amiodarone as advised low-dose beta-blocker legs EKG again showed flutter with prolonged QTC recommendations per cardiology services follow-up regarding RVR consideration for DCCV as outpatient.  Blood sugars overall controlled changed to invokana as patient preferred no insulin..  Bouts of constipation resolved with laxative assistance.  Patient with bouts of peripheral edema intravenous Lasix as well as IV albumin per cardiology services additional dose given 629 transition to p.o. Lasix latest echocardiogram normal ejection fraction.   Blood pressures were monitored on TID basis and controlled  Diabetes has been monitored with ac/hs CBG checks and SSI was use prn for tighter BS control.    Rehab course: During patient's stay in rehab weekly team conferences were held to monitor patient's progress, set goals and discuss barriers to discharge. At admission, patient required moderate assist side-lying to sitting moderate assist sit to side-lying.  Moderate assist ambulation 3 feet.  Minimal assist upper body dressing and mod max assist lower body dressing  Physical exam.  Blood pressure 110/81  pulse 91 temperature 99.6 respirations 16 oxygen saturation 100% room air Constitutional alert and oriented no distress cooperative with exam HEENT Head.  Normocephalic and atraumatic Eyes.  Pupils round and reactive to light no discharge.nystagmus Neck.  Supple nontender no JVD without thyromegaly Cardiac irregular irregular Abdomen.  Soft nontender positive bowel sounds without rebound Respiratory effort normal no respiratory distress without wheeze Skin.  Sternal incision well-healed Musculoskeletal.  Normal range of motion no rigidity or tenderness Comments.  Upper extremities 5 -/5 in deltoids biceps triceps wrist extension grip finger abduction B/L Lower extremities hip flexors 2+/5 on right 2/5 on left knee extension 3 -/5, dorsiflexion plantarflexion 4/5 bilaterally Neurological patient alert no acute distress follows full commands   He/  has had improvement in activity tolerance, balance, postural control as well as ability to compensate for deficits. He/ has had improvement in functional use RUE/LUE  and RLE/LLE as well as improvement in awareness.  Working with energy conservation techniques.  Performed bed mobility with use of bed features and contact-guard assist.  Ambulates 55 feet rolling walker contact-guard assist.  Propelled his wheelchair independently with supervision.  Required minimal assist for sit to supine for bilateral lower extremity management.  Bed mobility performed with ADLs with supervision use of figure 4 position to don bilateral shoes.  Stand pivot transfer from bed to wheelchair contact-guard assist.  Demonstrated short distance ambulation of 10 feet rolling walker transferred onto TTB placed into a tub shower combination minimal guard.  Full family teaching completed plan discharged home  Disposition: Discharge to home    Diet: Carb modified  Special Instructions: No driving smoking or alcohol Medications at discharge 1.  Tylenol as needed 2.   Amiodarone 200 mg p.o. daily 3.  Amoxicillin 500 mg p.o. every 12 hours 4.  Eliquis 5 mg p.o. twice daily 5.  Flexeril 5 mg 3 times daily as needed 6.  Trinsicon 1 capsule daily 7.  Ferrous sulfate 325 mg p.o. twice daily 8.  Lasix 60 mg p.o. daily 9.  Invokana 100 mg daily 10.  Synthroid 25 mcg p.o. daily 11.  Lidoderm patch 2 patches change as directed 12.  Lopressor 12.5 mg p.o. twice daily 13.  Oxycodone 5 mg every 4 hours as needed severe pain 14.  OxyContin sustained release 10 mg every 12 hours 15.  MiraLAX daily 16.  Potassium chloride 20 mEq p.o. twice daily 17.  Lyrica 100 mg p.o. 3 times daily  30-35 minutes were spent completing discharge summary and discharge planning  Discharge Instructions     Ambulatory referral to Physical Medicine Rehab   Complete by: As directed    Moderate complexity follow-up 1 to 2 weeks discitis        Follow-up Information     Jamse Arn, MD Follow up.   Specialty: Physical Medicine and Rehabilitation Why: Office to call for appointment Contact information: [address] [place] [address] [place] [address] [phone]         Revankar, Reita Cliche, MD Follow up.   Specialty: Cardiology Why: Call for appointment Contact information: [place] [address] [phone]         Michel Bickers, MD Follow up.   Specialty: Infectious Diseases Why: Call for appointment Contact information: [address] [place] [address] [phone]         Newt Minion, MD Follow up.   Specialty: Orthopedic Surgery Why: Call for appointment Contact information: [place] [address] [phone]         Gaye Pollack, MD Follow up.   Specialty: Cardiothoracic Surgery Why: Call for appointment Contact information: [address] [place] [address] [phone]                 Signed: Cathlyn Parsons 06/04/2020, 4:46  AM Patient was seen, face-face, and physical exam performed by me on day of discharge, greater than 30 minutes of total time spent.. Please see progress note from day of discharge as well.  Delice Lesch, MD, ABPMR

## 2020-06-02 NOTE — Progress Notes (Signed)
[facility] PHYSICAL MEDICINE & REHABILITATION PROGRESS NOTE  Subjective/Complaints: Patient seen sitting up in bed this morning.  He states he slept well overnight.  He and his wife have questions regarding DME at discharge.  He also has questions regarding discontinuing his IV access.  He was seen by cards yesterday, notes reviewed-follow-up arranged.  ROS: Denies CP, SOB, N/V/D  Objective: Vital Signs: Blood pressure 105/83, pulse 86, temperature 97.6 F (36.4 C), resp. rate 18, height 5\' 9"  (1.753 m), weight 77.2 kg, SpO2 98 %. No results found. Recent Labs    06/01/20 0749 06/02/20 0559  WBC 6.9 6.9  HGB 9.0* 9.3*  HCT 30.4* 31.7*  PLT 298 280   Recent Labs    06/01/20 0809  NA 139  K 4.0  CL 101  CO2 27  GLUCOSE 206*  BUN 33*  CREATININE 1.43*  CALCIUM 9.3    Physical Exam: BP 105/83   Pulse 86   Temp 97.6 F (36.4 C)   Resp 18   Ht 5\' 9"  (1.753 m)   Wt 77.2 kg   SpO2 98%   BMI 25.13 kg/m  Constitutional: No distress . Vital signs reviewed. HENT: Normocephalic.  Atraumatic. Eyes: EOMI. No discharge. Cardiovascular: No JVD.  Irregularly irregular. Respiratory: Normal effort.  No stridor.  Bilaterally clear to auscultation. GI: Non-distended.  BS +. Skin: Knee is healing Psych: Normal mood.  Normal behavior. Musc: Bilateral knees with edema and tenderness. Neuro: Alert Motor:  Right lower extremity: Hip flexion, knee extension 4-/5, ankle dorsiflexion 4+/5, unchanged Left lower extremity: Hip flexion, knee extension 4-/5, ankle dorsiflexion 4+/5, unchanged  Assessment/Plan: 1. Functional deficits secondary to debility due to septic knees, disciitis which require 3+ hours per day of interdisciplinary therapy in a comprehensive inpatient rehab setting.  Physiatrist is providing close team supervision and 24 hour management of active medical problems listed below.  Physiatrist and rehab team continue to assess barriers to discharge/monitor patient  progress toward functional and medical goals  Care Tool:  Bathing  Bathing activity did not occur: Refused Body parts bathed by patient: Right arm, Left arm, Chest, Abdomen, Right upper leg, Left upper leg, Face, Front perineal area, Buttocks, Right lower leg, Left lower leg   Body parts bathed by helper: Right lower leg, Left lower leg     Bathing assist Assist Level: Contact Guard/Touching assist     Upper Body Dressing/Undressing Upper body dressing   What is the patient wearing?: Pull over shirt    Upper body assist Assist Level: Set up assist    Lower Body Dressing/Undressing Lower body dressing      What is the patient wearing?: Pants, Underwear/pull up     Lower body assist Assist for lower body dressing: Minimal Assistance - Patient > 75%     Toileting Toileting Toileting Activity did not occur (Clothing management and hygiene only): N/A (no void or bm)  Toileting assist Assist for toileting: Contact Guard/Touching assist     Transfers Chair/bed transfer  Transfers assist     Chair/bed transfer assist level: Contact Guard/Touching assist     Locomotion Ambulation   Ambulation assist      Assist level: Supervision/Verbal cueing Assistive device: Walker-rolling Max distance: 110'   Walk 10 feet activity   Assist     Assist level: Supervision/Verbal cueing Assistive device: Walker-rolling   Walk 50 feet activity   Assist Walk 50 feet with 2 turns activity did not occur: Safety/medical concerns  Assist level: Supervision/Verbal cueing Assistive device:  Walker-rolling    Walk 150 feet activity   Assist Walk 150 feet activity did not occur: Safety/medical concerns         Walk 10 feet on uneven surface  activity   Assist Walk 10 feet on uneven surfaces activity did not occur: Safety/medical concerns   Assist level: Minimal Assistance - Patient > 75% Assistive device: Aeronautical engineer Will  patient use wheelchair at discharge?: Yes Type of Wheelchair: Manual    Wheelchair assist level: Supervision/Verbal cueing Max wheelchair distance: 71ft    Wheelchair 50 feet with 2 turns activity    Assist            Wheelchair 150 feet activity     Assist            Medical Problem List and Plan: 1.  Decreased functional mobility secondary to sepsis/Enterococcus faecalis bacteremia/discitis/osteomyelitis of L4-5 status post aortic root replacement 04/17/2020 status post cardioversion for atrial fibrillation.  Sternal precautions/lumbar corset for comfort x 4 weeks.  Continue CIR 2.  Antithrombotics: -DVT/anticoagulation: Resumed Eliquis 05/15/2020             -antiplatelet therapy: N/A 3. Pain Management: OxyContin sustained release 10 mg every 12 hours, Lyrica 100 mg 3 times daily Flexeril as as well as oxycodone needed  Controlled with meds on 7/13  Lidoderm patch ordered on 6/30, with improvement 4. Mood: N/A meds- follow for ego support             -antipsychotic agents: N/A 5. Neuropsych: This patient is capable of making decisions on his own behalf. 6. Skin/Wound Care: Routine skin checks 7. Fluids/Electrolytes/Nutrition: Routine in and outs. 8.  ID/Enterococcus bacteremia.  IV ampicillin 2 g every 4 hours as well as Rocephin 2 g every 12 hours completed on 6/30.  Follow-up infectious disease 9.  Acute on chronic anemia.  Continue iron supplement.    Hb 9.3 on 7/13  Cont to monitor 10.  Bilateral septic knees.  Status post total synovectomy irrigation debridement bilateral knees 05/02/2020 per Dr. Sharol Given.  Weightbearing as tolerated.   Continue ice to bilateral knees  11.  AKI.     Creatinine 1.44 on 7/7, 1.43 on 7/12.  Secondary to diuresis 12.  PAF.  Follow-up cardiology services.  Amiodarone as directed.    Continue Lopressor 12.5 mg twice daily.    Monitor with increased exertion  Repeat ECG reviewed, showing a flutter with prolonged  QTC  Recommendations per cardiology  Will follow up with Cards regarding Afib with RVR-consideration of DCCV as outpatient 13.  Diabetes mellitus with hyperglycemia.  Latest hemoglobin A1c 6.8.    Lantus insulin 10 units twice daily, decreased to 8 units on 7/6.   CBG (last 3)  Recent Labs    06/01/20 1656 06/01/20 2132 06/02/20 0540  GLUCAP 114* 180* 199*    Labile on 7/13  Monitor with increased mobility 14.  Recent C3-4 ACDF 03/02/2020 per [name] for cervical myelopathy.  Cervical collar discontinued 15.  Hypothyroidism.  Synthroid 16. Drug-induced constipation.  MiraLAX daily, Senokot nightly  Adjust bowel meds as necessary 17.  Severe hypoalbuminemia  Supplement initiated on 6/25  IV supplementation x3 completed 18.  Transaminitis: Resolved  LFTs within normal limits on 6/29 19.  Peripheral edema  See #12, #19  IV Lasix and IV albumin per cardiology, additional dose given on 6/29, transitioned to p.o. Lasix on 6/30, decreased on 7/6  Echo reviewed from 6/9-showing normal EF  BNP elevated  Filed Weights   05/29/20 0453 05/30/20 0408 06/01/20 0600  Weight: 85.2 kg 84.3 kg 77.2 kg   ?  Reliability on 7/12 20.  Prolonged QTC  Monitor in conjunction with other medications  Appreciate cards recs 21.  Hypothyroidism  TSH elevated on 6/29 22.  Soft blood pressures   Vitals:   06/02/20 0539 06/02/20 0844  BP: (!) 90/56 105/83  Pulse: 99 86  Resp:    Temp: 97.6 F (36.4 C)   SpO2: 98%    Secondary to diuresis, asymptomatic, but persistent on 7/13 23.  Hypokalemia-secondary to diuretics  Potassium 4.0 on 7/12  Supplement increased on 7/1  LOS: 19 days A FACE TO FACE EVALUATION WAS PERFORMED  Johnathan Murray Johnathan Murray 06/02/2020, 8:48 AM

## 2020-06-02 NOTE — Progress Notes (Signed)
Occupational Therapy Session Note  Patient Details  Name: Johnathan Murray MRN: [id number] Date of Birth: February 20, 1951  Today's Date: 06/02/2020 OT Individual Time: 2992-4268 OT Individual Time Calculation (min): 60 min    Short Term Goals: Week 2:  OT Short Term Goal 1 (Week 2): Pt will complete toileting tasks with mod assist at sit > stand level OT Short Term Goal 1 - Progress (Week 2): Met OT Short Term Goal 2 (Week 2): Pt will complete LB dressing at sit > stand level with mod assist OT Short Term Goal 2 - Progress (Week 2): Met OT Short Term Goal 3 (Week 2): Pt will complete bathing at shower level with min assist OT Short Term Goal 3 - Progress (Week 2): Partly met Week 3:  OT Short Term Goal 1 (Week 3): STG = LTGs due to remaining LOS Week 4:     Skilled Therapeutic Interventions/Progress Updates:    Treatment session with focus on functional mobility, dynamic standing balance, and strengthening of BLE to improve activity endurance for ADL tasks and functional mobility. Wife present for session. Pt received semi-reclined in bed requesting use of Nu Step. Completed ambulatory transfer with CGA and RW bed > w/c. Ambulated short distance from w/c to Nu step. Completed Nu step for three sets of three minutes. Completed the first set at a resistance of 1 and width of 8, the second set at a resistance of 2 and a width of 9, and the last set at a resistance of 1 and a width of 10. Progressively graded resistance and width to increase strengthening challenge for BLE. Pt reported 7/10 pain in RLE, decreased resistance and width and facilitated rest breaks. Ambulated with RW and CGA to mat. Completed dynamic standing balance activity incorporating lateral functional reach below the waistline to target dynamic standing balance and functional reach for LB dressing. Decreased challenge of functional reach and modified pt positioning to prevent trunk rotation due to pt report of low back pain.  Ambulated short distance to w/c and bed with CGA to increase endurance for functional mobility around the home. Ended session with pt semi-reclined in bed with wife present. Therapy Documentation Precautions:  Precautions Precautions: Fall Precaution Comments: freq cues to adhere to sternal prec Required Braces or Orthoses: Spinal Brace Spinal Brace: Lumbar corset Other Brace: for comfort Restrictions Weight Bearing Restrictions: No Other Position/Activity Restrictions: sternal precautions General:   Vital Signs: Therapy Vitals Temp: 98.4 F (36.9 C) Pulse Rate: 82 Resp: 16 BP: (!) 87/62 Patient Position (if appropriate): Lying Oxygen Therapy SpO2: 96 % O2 Device: Room Air Pain: 7/10 pain in back, repositioned  Therapy/Group: Individual Therapy  Michelle Nasuti 06/02/2020, 2:32 PM

## 2020-06-02 NOTE — Progress Notes (Signed)
Patient ID: Johnathan Murray, male   DOB: 1951/03/17, 69 y.o.   MRN: [id number]  Patient declined by [facility] due to service area

## 2020-06-02 NOTE — Plan of Care (Signed)
  Problem: Consults Goal: RH GENERAL PATIENT EDUCATION Description: See Patient Education module for education specifics. Outcome: Progressing Goal: Skin Care Protocol Initiated - if Braden Score 18 or less Description: If consults are not indicated, leave blank or document N/A Outcome: Progressing   Problem: RH BOWEL ELIMINATION Goal: RH STG MANAGE BOWEL WITH ASSISTANCE Description: STG Manage Bowel with mod I Assistance. Outcome: Progressing Goal: RH STG MANAGE BOWEL W/MEDICATION W/ASSISTANCE Description: STG Manage Bowel with Medication with mod I Assistance. Outcome: Progressing   Problem: RH SKIN INTEGRITY Goal: RH STG MAINTAIN SKIN INTEGRITY WITH ASSISTANCE Description: STG Maintain Skin Integrity With min Assistance. Outcome: Progressing Goal: RH STG ABLE TO PERFORM INCISION/WOUND CARE W/ASSISTANCE Description: STG Able To Perform Incision/Wound Care With min Assistance. Outcome: Progressing   Problem: RH SAFETY Goal: RH STG ADHERE TO SAFETY PRECAUTIONS W/ASSISTANCE/DEVICE Description: STG Adhere to Safety Precautions With cues/reminders Assistance/Device. Outcome: Progressing   Problem: RH PAIN MANAGEMENT Goal: RH STG PAIN MANAGED AT OR BELOW PT'S PAIN GOAL Description: Less than 4  Outcome: Progressing   Problem: Consults Goal: RH GENERAL PATIENT EDUCATION Description: See Patient Education module for education specifics. Outcome: Progressing

## 2020-06-03 ENCOUNTER — Inpatient Hospital Stay (HOSPITAL_COMMUNITY): Payer: BC Managed Care – PPO

## 2020-06-03 ENCOUNTER — Inpatient Hospital Stay: Payer: BC Managed Care – PPO | Admitting: Internal Medicine

## 2020-06-03 ENCOUNTER — Inpatient Hospital Stay (HOSPITAL_COMMUNITY): Payer: BC Managed Care – PPO | Admitting: Occupational Therapy

## 2020-06-03 DIAGNOSIS — R5381 Other malaise: Secondary | ICD-10-CM | POA: Diagnosis not present

## 2020-06-03 LAB — CBC WITH DIFFERENTIAL/PLATELET
Abs Immature Granulocytes: 0.04 10*3/uL (ref 0.00–0.07)
Basophils Absolute: 0.1 10*3/uL (ref 0.0–0.1)
Basophils Relative: 1 %
Eosinophils Absolute: 0.3 10*3/uL (ref 0.0–0.5)
Eosinophils Relative: 4 %
HCT: 31.7 % — ABNORMAL LOW (ref 39.0–52.0)
Hemoglobin: 9.4 g/dL — ABNORMAL LOW (ref 13.0–17.0)
Immature Granulocytes: 1 %
Lymphocytes Relative: 17 %
Lymphs Abs: 1.2 10*3/uL (ref 0.7–4.0)
MCH: 27.2 pg (ref 26.0–34.0)
MCHC: 29.7 g/dL — ABNORMAL LOW (ref 30.0–36.0)
MCV: 91.9 fL (ref 80.0–100.0)
Monocytes Absolute: 0.5 10*3/uL (ref 0.1–1.0)
Monocytes Relative: 8 %
Neutro Abs: 4.6 10*3/uL (ref 1.7–7.7)
Neutrophils Relative %: 69 %
Platelets: 289 10*3/uL (ref 150–400)
RBC: 3.45 MIL/uL — ABNORMAL LOW (ref 4.22–5.81)
RDW: 18 % — ABNORMAL HIGH (ref 11.5–15.5)
WBC: 6.7 10*3/uL (ref 4.0–10.5)
nRBC: 0 % (ref 0.0–0.2)

## 2020-06-03 LAB — GLUCOSE, CAPILLARY
Glucose-Capillary: 129 mg/dL — ABNORMAL HIGH (ref 70–99)
Glucose-Capillary: 124 mg/dL — ABNORMAL HIGH (ref 70–99)
Glucose-Capillary: 158 mg/dL — ABNORMAL HIGH (ref 70–99)
Glucose-Capillary: 112 mg/dL — ABNORMAL HIGH (ref 70–99)

## 2020-06-03 MED ORDER — EZETIMIBE 10 MG PO TABS: 10.0000 mg | ORAL_TABLET | Freq: Every day | ORAL | 0 refills | Status: DC

## 2020-06-03 MED ORDER — OXYCODONE HCL 5 MG PO TABS: 5.0000 mg | ORAL_TABLET | ORAL | 0 refills | Status: DC | PRN

## 2020-06-03 MED ORDER — CANAGLIFLOZIN 100 MG PO TABS: 100.0000 mg | ORAL_TABLET | Freq: Every day | ORAL | 1 refills | Status: DC

## 2020-06-03 MED ORDER — OXYCODONE HCL ER 10 MG PO T12A: 10.0000 mg | EXTENDED_RELEASE_TABLET | Freq: Two times a day (BID) | ORAL | 0 refills | Status: DC

## 2020-06-03 MED ORDER — METOPROLOL TARTRATE 25 MG PO TABS: 12.5000 mg | ORAL_TABLET | Freq: Two times a day (BID) | ORAL | 1 refills | Status: DC

## 2020-06-03 MED ORDER — LIDOCAINE 5 % EX PTCH: 2.0000 | MEDICATED_PATCH | CUTANEOUS | 0 refills | Status: DC

## 2020-06-03 MED ORDER — AMOXICILLIN 500 MG PO CAPS
500.0000 mg | ORAL_CAPSULE | Freq: Two times a day (BID) | ORAL | 3 refills | Status: DC
Start: 2020-06-03 — End: 2020-06-17

## 2020-06-03 MED ORDER — INSULIN GLARGINE 100 UNIT/ML SOLOSTAR PEN: 8.0000 [IU] | PEN_INJECTOR | Freq: Two times a day (BID) | SUBCUTANEOUS | 11 refills | Status: DC

## 2020-06-03 MED ORDER — POLYETHYLENE GLYCOL 3350 17 G PO PACK: 17.0000 g | PACK | Freq: Every day | ORAL | 0 refills | Status: DC

## 2020-06-03 MED ORDER — PREGABALIN 100 MG PO CAPS: 100.0000 mg | ORAL_CAPSULE | Freq: Three times a day (TID) | ORAL | 0 refills | Status: DC

## 2020-06-03 MED ORDER — CYCLOBENZAPRINE HCL 5 MG PO TABS: 5.0000 mg | ORAL_TABLET | Freq: Three times a day (TID) | ORAL | 0 refills | Status: DC | PRN

## 2020-06-03 MED ORDER — FERROUS SULFATE 325 (65 FE) MG PO TABS: 325.0000 mg | ORAL_TABLET | Freq: Two times a day (BID) | ORAL | 3 refills | Status: DC

## 2020-06-03 MED ORDER — CANAGLIFLOZIN 100 MG PO TABS
100.0000 mg | ORAL_TABLET | Freq: Every day | ORAL | Status: DC
  Administered 2020-06-04: 100 mg via ORAL
  Filled 2020-06-03: qty 1

## 2020-06-03 MED ORDER — POTASSIUM CHLORIDE CRYS ER 20 MEQ PO TBCR: 20.0000 meq | EXTENDED_RELEASE_TABLET | Freq: Two times a day (BID) | ORAL | 0 refills | Status: DC

## 2020-06-03 MED ORDER — LEVOTHYROXINE SODIUM 25 MCG PO TABS: 25.0000 ug | ORAL_TABLET | Freq: Every day | ORAL | 0 refills | Status: AC

## 2020-06-03 MED ORDER — AMOXICILLIN 500 MG PO CAPS
500.0000 mg | ORAL_CAPSULE | Freq: Two times a day (BID) | ORAL | 3 refills | Status: DC
Start: 2020-06-03 — End: 2020-06-03

## 2020-06-03 MED ORDER — LEVOTHYROXINE SODIUM 25 MCG PO TABS: 25.0000 ug | ORAL_TABLET | Freq: Every day | ORAL | 0 refills | Status: DC

## 2020-06-03 MED ORDER — APIXABAN 5 MG PO TABS: 5.0000 mg | ORAL_TABLET | Freq: Two times a day (BID) | ORAL | 1 refills | Status: DC

## 2020-06-03 MED ORDER — AMIODARONE HCL 200 MG PO TABS: 200.0000 mg | ORAL_TABLET | Freq: Every day | ORAL | 1 refills | Status: DC

## 2020-06-03 MED ORDER — BLOOD GLUCOSE METER KIT: PACK | 0 refills | Status: DC

## 2020-06-03 MED ORDER — FUROSEMIDE 20 MG PO TABS: 60.0000 mg | ORAL_TABLET | Freq: Every day | ORAL | 1 refills | Status: DC

## 2020-06-03 MED ORDER — OXYCODONE HCL ER 10 MG PO T12A
10.0000 mg | EXTENDED_RELEASE_TABLET | Freq: Two times a day (BID) | ORAL | Status: DC
  Administered 2020-06-03 – 2020-06-04 (×2): 10 mg via ORAL
  Filled 2020-06-03 (×2): qty 1

## 2020-06-03 MED ORDER — ACETAMINOPHEN 325 MG PO TABS: 650.0000 mg | ORAL_TABLET | Freq: Four times a day (QID) | ORAL | Status: DC | PRN

## 2020-06-03 MED FILL — ELIQUIS 5 MG TABLET: 5 | 30 days supply | Qty: 60 | Fill #0

## 2020-06-03 NOTE — Progress Notes (Signed)
Patient ID: Johnathan Murray, male   DOB: 1950/12/03, 69 y.o.   MRN: [id number]   Outpatient PT referral faxed to [facility].

## 2020-06-03 NOTE — Progress Notes (Signed)
Physical Therapy Discharge Summary  Patient Details  Name: Dutch Ing MRN: [id number] Date of Birth: 1950-11-30  Today's Date: 06/03/2020 PT Individual Time: 2992-4268 PT Individual Time Calculation (min): 55 min    Patient has met 9 of 10 long term goals due to improved activity tolerance, improved balance, improved postural control, increased strength and decreased pain.  Patient to discharge at an ambulatory level Supervision.   Patient's care partner is independent to provide the necessary physical assistance at discharge.  Reasons goals not met: Pt plans to use alternate stairs to enter home.  Recommendation:  Patient will benefit from ongoing skilled PT services in home health setting to continue to advance safe functional mobility, address ongoing impairments in strength, balance, ambulation, endurance, and minimize fall risk.  Equipment: No equipment provided  Reasons for discharge: treatment goals met and discharge from hospital  Patient/family agrees with progress made and goals achieved: Yes   Skilled therapeutic Interventions:  Pt received supine in bed and agreeable to therapy. Reports 7/10 pain in low back. PT provides rest breaks and repositioning to manage pain. Supine to sit independent with use of bedrails. Stand step transfer to [facility] with RW and cues for hand placement and body mechanics to facilitate safe movement. WC transport to gym for energy conservation. Pt performs x4 steps with wife providing CGA and PT cuing on safe guarding technique. Pt performs ramp navigation with RW and wife providing CGA. Following seated rest break pt ambulates 95', and 107' with seated rest break due to fatigue in BLEs and back. PT educates pt and wife on continued safe mobility at home including ambulating 1x/hour for strengthening and cardiovascular health. Pt perform sit to supine independently with bedrails. Left supine in bed with alarm intact and all needs within each.  PT  Discharge Precautions/Restrictions Precautions Precautions: Fall Precaution Comments: freq cues to adhere to sternal prec Spinal Brace: Lumbar corset (for comfort, pt opts not to wear) Restrictions Weight Bearing Restrictions: No Other Position/Activity Restrictions: sternal precautions Vision/Perception  Perception Perception: Within Functional Limits Praxis Praxis: Intact  Cognition Overall Cognitive Status: Within Functional Limits for tasks assessed Arousal/Alertness: Awake/alert Orientation Level: Oriented X4 Attention: Focused;Sustained;Selective Focused Attention: Appears intact Sustained Attention: Appears intact Awareness: Appears intact Problem Solving: Appears intact Safety/Judgment: Appears intact Sensation Sensation Light Touch: Appears Intact (in BUE) Coordination Gross Motor Movements are Fluid and Coordinated: No Fine Motor Movements are Fluid and Coordinated: No Coordination and Movement Description: Gross motor movements impared due to pain and BLE weakness Motor  Motor Motor: Within Functional Limits Motor - Skilled Clinical Observations: generalized weakness, limited by pain  Mobility Bed Mobility Bed Mobility: Supine to Sit;Sit to Supine Rolling Right: Independent with assistive device Rolling Left: Independent with assistive device Supine to Sit: Independent with assistive device Sitting - Scoot to Edge of Bed: Supervision/Verbal cueing Sit to Supine: Independent with assistive device Transfers Transfers: Sit to Stand;Stand to Sit Sit to Stand: Supervision/Verbal cueing Stand to Sit: Supervision/Verbal cueing Stand Pivot Transfers: Supervision/Verbal cueing Stand Pivot Transfer Details: Verbal cues for sequencing;Verbal cues for technique;Verbal cues for precautions/safety Locomotion  Gait Ambulation: Yes Gait Assistance: Supervision/Verbal cueing Gait Distance (Feet): 107 Feet Assistive device: Rolling walker Gait Assistance Details:  Verbal cues for safe use of DME/AE Gait Gait: Yes Gait Pattern: Impaired Gait Pattern: Trunk flexed;Decreased stride length Gait velocity: decreased Stairs / Additional Locomotion Stairs: Yes Stairs Assistance: Contact Guard/Touching assist Stair Management Technique: Two rails Number of Stairs: 4 Height of Stairs: 6 Ramp: Contact Guard/touching  assist Curb: Nurse, mental health Mobility: No  Trunk/Postural Assessment  Cervical Assessment Cervical Assessment: Exceptions to [facility] (forward head) Thoracic Assessment Thoracic Assessment: Exceptions to [facility] (rounded shoulders) Lumbar Assessment Lumbar Assessment: Exceptions to [facility] (posterior pelvic tilt) Postural Control Postural Control: Within Functional Limits Postural Limitations: Delayed righting reactions. Likely secondary to pain.  Balance Balance Balance Assessed: Yes Static Sitting Balance Static Sitting - Balance Support: Feet supported Static Sitting - Level of Assistance: 7: Independent Dynamic Sitting Balance Dynamic Sitting - Balance Support: Feet supported Dynamic Sitting - Level of Assistance: 7: Independent Static Standing Balance Static Standing - Balance Support: Bilateral upper extremity supported Static Standing - Level of Assistance: 5: Stand by assistance Dynamic Standing Balance Dynamic Standing - Balance Support: Bilateral upper extremity supported Dynamic Standing - Level of Assistance: 5: Stand by assistance Extremity Assessment  RUE Assessment RUE Assessment: Within Functional Limits Passive Range of Motion (PROM) Comments: WFL Active Range of Motion (AROM) Comments: WFL LUE Assessment LUE Assessment: Within Functional Limits Passive Range of Motion (PROM) Comments: WFL Active Range of Motion (AROM) Comments: WFL RLE Assessment RLE Assessment: Exceptions to [facility] General Strength Comments: Grossly 3+/5 LLE Assessment LLE Assessment: Exceptions to  [facility] General Strength Comments: Grossly 3+/5    Breck Coons, PT, DPT 06/03/2020, 4:03 PM

## 2020-06-03 NOTE — Progress Notes (Addendum)
Occupational Therapy Session Note  Patient Details  Name: Johnathan Murray MRN: [id number] Date of Birth: 07/29/1951  Today's Date: 06/03/2020 OT Individual Time: 1410-1505 OT Individual Time Calculation (min): 42 min    Short Term Goals: Week 3:  OT Short Term Goal 1 (Week 3): STG = LTGs due to remaining LOS  Skilled Therapeutic Interventions/Progress Updates:    Treatment session with focus on ADL retraining, discharge planning, and functional transfers. Pt received semi-reclined in bed with wife present. Pt reports chronic back pain improving with movement, requesting shower. Completed ambulatory transfer to TTB with RW and CGA. Doffed clothing with minA and showered with supervision seated at TTB. Ambulated to bed with RW with CGA. Donned shirt with supervision at EOB and donned pants with minA to thread one pant leg over pt's heel. Donned shoes with set up. Pt completed ambulating tub transfers with RW with close supervision. Ambulated short distance with RW with CGA to bed, completing sit to supine, and sitting EOB with supervision. Pt and wife with no further questions and declined further functional transfers in apartment. Ambulated ~100 ft to bed with RW and CGA. Ended session with pt semi-reclined in bed with all needs within reach.   Therapy Documentation Precautions:  Precautions Precautions: Fall Precaution Comments: freq cues to adhere to sternal prec Required Braces or Orthoses: Spinal Brace Spinal Brace: Lumbar corset (for comfort, pt opts not to wear) Other Brace: for comfort Restrictions Weight Bearing Restrictions: No Other Position/Activity Restrictions: sternal precautions Pain: Pain Assessment Pain Type: Chronic pain Pain Location: Back   Therapy/Group: Individual Therapy  Michelle Nasuti 06/03/2020, 3:06 PM

## 2020-06-03 NOTE — Progress Notes (Addendum)
Occupational Therapy Discharge Summary  Patient Details  Name: Johnathan Murray MRN: [id number] Date of Birth: 16-Dec-1950  Today's Date: 06/03/2020  Patient has met 10 of 10 long term goals due to improved activity tolerance, improved balance, postural control, ability to compensate for deficits, improved awareness and improved coordination.  Patient to discharge at [facility] Supervision- [place] level.  Patient's care partner is independent to provide the necessary physical and cognitive assistance at discharge. Pt is discharging home to wife with 24/7 supervision and physical assistance as needed. Pt's wife has been present at many sessions and has demonstrated proficiency in assisting with pt's mobility and ADLs. Pt and wife with no further questions.     Reasons goals not met: n/a  Recommendation:  Pt requires no further OT follow-up.  Equipment: TTB  Reasons for discharge: treatment goals met and discharge from hospital  Patient/family agrees with progress made and goals achieved: Yes  OT Discharge Precautions/Restrictions  Precautions Precautions: Fall Precaution Comments: freq cues to adhere to sternal prec Spinal Brace: Lumbar corset (for comfort, pt opts not to wear) Restrictions Weight Bearing Restrictions: No Other Position/Activity Restrictions: sternal precautions General   Vital Signs Therapy Vitals Temp: 98.1 F (36.7 C) Pulse Rate: 69 Resp: 16 BP: (!) 94/54 Patient Position (if appropriate): Lying Oxygen Therapy SpO2: 100 % O2 Device: Room Air Pain Pain Assessment Pain Type: Chronic pain Pain Location: Back ADL ADL Eating: Independent Where Assessed-Eating: Bed level Grooming: Supervision/safety Where Assessed-Grooming: Edge of bed Upper Body Bathing: Supervision/safety Where Assessed-Upper Body Bathing: Shower Lower Body Bathing: Minimal assistance Where Assessed-Lower Body Bathing: Shower Upper Body Dressing: Supervision/safety Where  Assessed-Upper Body Dressing: Edge of bed Lower Body Dressing: Minimal assistance Where Assessed-Lower Body Dressing: Edge of bed Toileting: Minimal assistance Toilet Transfer: Therapist, music Method: Arts development officer: Radiographer, therapeutic: Contact guard Vision Baseline Vision/History: Wears glasses Wears Glasses: Reading only Patient Visual Report: No change from baseline Vision Assessment?: No apparent visual deficits Perception  Perception: Within Functional Limits Praxis Praxis: Intact Cognition Overall Cognitive Status: Within Functional Limits for tasks assessed Arousal/Alertness: Awake/alert Orientation Level: Oriented X4 Attention: Focused;Sustained;Selective Focused Attention: Appears intact Sustained Attention: Appears intact Awareness: Appears intact Problem Solving: Appears intact Safety/Judgment: Appears intact Sensation Sensation Light Touch: Appears Intact (in BUE) Coordination Gross Motor Movements are Fluid and Coordinated: No Fine Motor Movements are Fluid and Coordinated: Yes Coordination and Movement Description: Gross motor movements impared due to pain and BLE weakness Motor  Motor Motor: Within Functional Limits Motor - Skilled Clinical Observations: generalized weakness, limited by pain Mobility  Bed Mobility Bed Mobility: Supine to Sit;Sit to Supine Rolling Right: Supervision/verbal cueing Supine to Sit: Supervision/Verbal cueing Sitting - Scoot to Edge of Bed: Supervision/Verbal cueing Sit to Supine: Supervision/Verbal cueing Transfers Sit to Stand: Contact Guard/Touching assist Stand to Sit: Supervision/Verbal cueing  Trunk/Postural Assessment  Postural Control Postural Limitations: Delayed righting reactions. Likely secondary to pain.  Balance Static Sitting Balance Static Sitting - Level of Assistance: 5: Stand by assistance Dynamic Sitting Balance Dynamic Sitting - Level of  Assistance: 5: Stand by assistance Static Standing Balance Static Standing - Level of Assistance: 5: Stand by assistance Dynamic Standing Balance Dynamic Standing - Level of Assistance: 5: Stand by assistance Extremity/Trunk Assessment RUE Assessment RUE Assessment: Within Functional Limits Passive Range of Motion (PROM) Comments: WFL Active Range of Motion (AROM) Comments: WFL LUE Assessment LUE Assessment: Within Functional Limits Passive Range of Motion (PROM) Comments: WFL Active Range of Motion (  AROM) Comments: [name] 06/03/2020, 6:58 AM

## 2020-06-03 NOTE — Progress Notes (Signed)
Occupational Therapy Session Note  Patient Details  Name: Johnathan Murray MRN: [id number] Date of Birth: October 13, 1951  Today's Date: 06/03/2020 OT Individual Time: 4174-0814 OT Individual Time Calculation (min): 42 min    Short Term Goals: Week 3:  OT Short Term Goal 1 (Week 3): STG = LTGs due to remaining LOS  Skilled Therapeutic Interventions/Progress Updates:    Treatment session with focus on discharge planning, functional transfers to improve activity endurance and carryover of transfer training, and dynamic standing balance to improve balance for ADL tasks. Pt received semi-reclined in bed with wife present. Ambulated with distant w/c follow ~100 ft from his bed to the Nu Step with CGA. Utilized the Nu Step for two 3 minute sets at 9 distance and 1 resistance with pt declining further challenge due to possibility of pain and to ensure energy conservation. Pt ambulated short distance to table with RW and CGA. Completed activity in standing to promote dynamic standing balance and activity endurance. Facilitated use of table to provide balance support with LUE. Reported pain in back, facilitated rest break for energy conservation and pain management. Pt ambulated short distance to bed with RW and CGA. Ended session with pt semi-reclined in bed with wife present and all needs within reach.  Therapy Documentation Precautions:  Precautions Precautions: Fall Precaution Comments: freq cues to adhere to sternal prec Required Braces or Orthoses: Spinal Brace Spinal Brace: Lumbar corset (for comfort, pt opts not to wear) Other Brace: for comfort Restrictions Weight Bearing Restrictions: No Other Position/Activity Restrictions: sternal precautions General:   Vital Signs: Therapy Vitals Temp: 98.1 F (36.7 C) Pulse Rate: 69 Resp: 16 BP: (!) 94/54 Patient Position (if appropriate): Lying Oxygen Therapy SpO2: 100 % O2 Device: Room Air Pain:     Therapy/Group: Individual  Therapy  Michelle Nasuti 06/03/2020, 7:15 AM

## 2020-06-03 NOTE — Patient Care Conference (Signed)
Inpatient RehabilitationTeam Conference and Plan of Care Update Date: 06/03/2020   Time: 3:05 PM    Patient Name: Johnathan Murray      Medical Record Number: [id number]  Date of Birth: Aug 11, 1951 Sex: Male         Room/Bed: 4W10C/4W10C-01 Payor Info: Payor: [place] / Plan: [employer] COMM PPO / Product Type: *No Product type* /    Admit Date/Time:  05/14/2020  3:19 PM  Primary Diagnosis:  Debility  Hospital Problems: Principal Problem:   Debility Active Problems:   Diabetes mellitus due to underlying condition with unspecified complications ([place])   PAF (paroxysmal atrial fibrillation) (HCC)   S/P AVR   Bacteremia due to Enterococcus   Septic arthritis of knee, bilateral (HCC)   Moderate protein-calorie malnutrition (HCC)   Discitis   Pressure injury of skin   Postoperative pain   Transaminitis   Hypoalbuminemia due to protein-calorie malnutrition (HCC)   Drug induced constipation   Controlled type 2 diabetes mellitus with hyperglycemia, with long-term current use of insulin (HCC)   AKI (acute kidney injury) ([place])   History of prolonged Q-T interval on ECG   Peripheral edema   Labile blood glucose   Atrial fibrillation with RVR ([place])   Anasarca   Hypokalemia   Hypotension due to hypovolemia   Hypotension due to drugs   Drug-induced hypotension    Expected Discharge Date: Expected Discharge Date: 06/04/20  Team Members Present: Physician leading conference: Dr. Delice Lesch Care Coodinator Present: Dorien Chihuahua, RN, BSN, CRRN;Christina Sampson Goon, BSW Nurse Present: Other (comment) Floydene Flock, RN) PT Present: Barrie Folk, PT OT Present: Simonne Come, OT PPS Coordinator present : Gunnar Fusi, SLP     Current Status/Progress Goal Weekly Team Focus  Bowel/Bladder   pt continent of B&B. LBM 7/13  Patient will maintain GI/GU continence during inpatient rehab stay  assess q shift/prn   Swallow/Nutrition/ Hydration             ADL's   CGA-min sit  > stand depending on height of surface, min LB dressing, set up bathing and UB dress  Supervision sitting balance and UB dressing; CGA for sit > stand, standing balance, and transfers; Min A bathing, toileting and LB dressing  ADL retraining, transfer training, endurance, pain management, pt/family ed, d/c planning   Mobility   S to CGA overall with RW; at goal level  CGA, mod A for steps  d/c planning, family edu   Communication             Safety/Cognition/ Behavioral Observations            Pain   Pt denies pain. Pt has scheduled oxycontin ER  Patient will maintain pain level less than 4 and have increased activity tolerance  assess pain q shift/prn   Skin   Pt skin intact. bilateral knee incisions OTA  Patient will maintain skin integrity and be free of infection and breakdown during inpatient rehab stay  assess q shift/prn     Team Discussion:  Discharge Planning/Teaching Needs:  Patient plans to discharge home with spouse (spouse cannot do heavy lifting, works 6:30-3PM)  Will schedule with spouse if required   Current Update: on target  Current Barriers to Discharge:  None  Possible Resolutions to Barriers:   Patient on target to meet rehab goals: Yes Follow up OP PT recommended  *See Care Plan and progress notes for long and short-term goals.   Revisions to Treatment Plan:  Medical Summary Current Status: Decreased functional mobility secondary to sepsis/Enterococcus faecalis bacteremia/discitis/osteomyelitis of L4-5 status post aortic root replacement 04/17/2020 status post cardioversion for atrial fibrillation. Weekly Focus/Goal: Improve mobility, AKI, CBGs  Barriers to Discharge: Medical stability   Possible Resolutions to Barriers: Therapies, Cards recs, family edu   Continued Need for Acute Rehabilitation Level of Care: The patient requires daily medical management by a physician with specialized training in physical medicine and rehabilitation for the  following reasons: Direction of a multidisciplinary physical rehabilitation program to maximize functional independence : Yes Medical management of patient stability for increased activity during participation in an intensive rehabilitation regime.: Yes Analysis of laboratory values and/or radiology reports with any subsequent need for medication adjustment and/or medical intervention. : Yes   I attest that I was present, lead the team conference, and concur with the assessment and plan of the team.   Dorien Chihuahua B 06/03/2020, 3:05 PM

## 2020-06-03 NOTE — Progress Notes (Signed)
Team Conference Report to Patient/Family  Team Conference discussion was reviewed with the patient and caregiver, including goals, any changes in plan of care and target discharge date.  Patient and caregiver express understanding and are in agreement.  The patient has a target discharge date of 06/04/20.  [name] 06/03/2020, 2:05 PM

## 2020-06-03 NOTE — Progress Notes (Signed)
[name] PHYSICAL MEDICINE & REHABILITATION PROGRESS NOTE  Subjective/Complaints: Patient seen sitting up in bed this morning.  He states he slept well overnight.  He is looking forward to discharge tomorrow.  Wife at bedside.  Both of questions regarding atrial fibrillation, Covid vaccine, follow-up equipment.  ROS: Denies CP, SOB, N/V/D  Objective: Vital Signs: Blood pressure (!) 94/54, pulse 69, temperature 98.1 F (36.7 C), resp. rate 16, height 5\' 9"  (1.753 m), weight 83.5 kg, SpO2 100 %. No results found. Recent Labs    06/02/20 0559 06/03/20 0714  WBC 6.9 6.7  HGB 9.3* 9.4*  HCT 31.7* 31.7*  PLT 280 289   Recent Labs    06/01/20 0809  NA 139  K 4.0  CL 101  CO2 27  GLUCOSE 206*  BUN 33*  CREATININE 1.43*  CALCIUM 9.3    Physical Exam: BP (!) 94/54 (BP Location: Right Arm)   Pulse 69   Temp 98.1 F (36.7 C)   Resp 16   Ht 5\' 9"  (1.753 m)   Wt 83.5 kg   SpO2 100%   BMI 27.18 kg/m  Constitutional: No distress . Vital signs reviewed. HENT: Normocephalic.  Atraumatic. Eyes: EOMI. No discharge. Cardiovascular: No JVD.  Irregularly irregular. Respiratory: Normal effort.  No stridor.  Bilateral clear to auscultation. GI: Non-distended.  BS +. Skin: Knees healing Psych: Normal mood.  Normal behavior. Musc: Bilateral knees with edema and tenderness, improving Neuro: Alert Motor:  Right lower extremity: Hip flexion, knee extension 4-4+/5, ankle dorsiflexion 4+/5 Left lower extremity: Hip flexion, knee extension 4-4+/5, ankle dorsiflexion 4+/5   Assessment/Plan: 1. Functional deficits secondary to debility due to septic knees, disciitis which require 3+ hours per day of interdisciplinary therapy in a comprehensive inpatient rehab setting.  Physiatrist is providing close team supervision and 24 hour management of active medical problems listed below.  Physiatrist and rehab team continue to assess barriers to discharge/monitor patient progress toward  functional and medical goals  Care Tool:  Bathing  Bathing activity did not occur: Refused Body parts bathed by patient: Right arm, Left arm, Chest, Abdomen, Right upper leg, Left upper leg, Face, Front perineal area, Buttocks, Right lower leg, Left lower leg   Body parts bathed by helper: Right lower leg, Left lower leg     Bathing assist Assist Level: Contact Guard/Touching assist     Upper Body Dressing/Undressing Upper body dressing   What is the patient wearing?: Pull over shirt    Upper body assist Assist Level: Set up assist    Lower Body Dressing/Undressing Lower body dressing      What is the patient wearing?: Pants, Underwear/pull up     Lower body assist Assist for lower body dressing: Minimal Assistance - Patient > 75%     Toileting Toileting Toileting Activity did not occur (Clothing management and hygiene only): N/A (no void or bm)  Toileting assist Assist for toileting: Contact Guard/Touching assist     Transfers Chair/bed transfer  Transfers assist     Chair/bed transfer assist level: Supervision/Verbal cueing     Locomotion Ambulation   Ambulation assist      Assist level: Supervision/Verbal cueing Assistive device: Walker-rolling Max distance: 110'   Walk 10 feet activity   Assist     Assist level: Supervision/Verbal cueing Assistive device: Walker-rolling   Walk 50 feet activity   Assist Walk 50 feet with 2 turns activity did not occur: Safety/medical concerns  Assist level: Supervision/Verbal cueing Assistive device: Walker-rolling  Walk 150 feet activity   Assist Walk 150 feet activity did not occur: Safety/medical concerns         Walk 10 feet on uneven surface  activity   Assist Walk 10 feet on uneven surfaces activity did not occur: Safety/medical concerns   Assist level: Minimal Assistance - Patient > 75% Assistive device: Aeronautical engineer Will patient use wheelchair at  discharge?: Yes Type of Wheelchair: Manual    Wheelchair assist level: Supervision/Verbal cueing Max wheelchair distance: 3ft    Wheelchair 50 feet with 2 turns activity    Assist            Wheelchair 150 feet activity     Assist            Medical Problem List and Plan: 1.  Decreased functional mobility secondary to sepsis/Enterococcus faecalis bacteremia/discitis/osteomyelitis of L4-5 status post aortic root replacement 04/17/2020 status post cardioversion for atrial fibrillation.  Sternal precautions/lumbar corset for comfort x 4 weeks.  Continue CIR  Team conference today to discuss current and goals and coordination of care, home and environmental barriers, and discharge planning with nursing, case manager, and therapies.  2.  Antithrombotics: -DVT/anticoagulation: Resumed Eliquis 05/15/2020             -antiplatelet therapy: N/A 3. Pain Management: OxyContin sustained release 10 mg every 12 hours, Lyrica 100 mg 3 times daily Flexeril as as well as oxycodone needed  Controlled with meds on 7/14  Lidoderm patch ordered on 6/30, with improvement 4. Mood: N/A meds- follow for ego support             -antipsychotic agents: N/A 5. Neuropsych: This patient is capable of making decisions on his own behalf. 6. Skin/Wound Care: Routine skin checks 7. Fluids/Electrolytes/Nutrition: Routine in and outs. 8.  ID/Enterococcus bacteremia.  IV ampicillin 2 g every 4 hours as well as Rocephin 2 g every 12 hours completed on 6/30.  Follow-up infectious disease  Continue amoxicillin 9.  Acute on chronic anemia.  Continue iron supplement.    Hb 9.4 on 7/14  Cont to monitor 10.  Bilateral septic knees.  Status post total synovectomy irrigation debridement bilateral knees 05/02/2020 per Dr. Sharol Given.  Weightbearing as tolerated.   Continue ice to bilateral knees  11.  AKI.     Creatinine 1.43 on 7/12  Secondary to diuresis 12.  PAF.  Follow-up cardiology services.  Amiodarone as  directed.    Continue Lopressor 12.5 mg twice daily.    Monitor with increased exertion  Repeat ECG reviewed, showing a flutter with prolonged QTC  Recommendations per cardiology  Will follow up with Cards regarding Afib with RVR-consideration of DCCV as outpatient 13.  Diabetes mellitus with hyperglycemia.  Latest hemoglobin A1c 6.8.    Lantus insulin 10 units twice daily, decreased to 8 units on 7/6.   CBG (last 3)  Recent Labs    06/02/20 1648 06/02/20 2124 06/03/20 0613  GLUCAP 152* 150* 129*    Labile on 7/14  Monitor with increased mobility 14.  Recent C3-4 ACDF 03/02/2020 per [name] for cervical myelopathy.  Cervical collar discontinued 15.  Hypothyroidism.  Synthroid 16. Drug-induced constipation.  MiraLAX daily, Senokot nightly  Adjust bowel meds as necessary 17.  Severe hypoalbuminemia  Supplement initiated on 6/25  IV supplementation x3 completed 18.  Transaminitis: Resolved  LFTs within normal limits on 6/29 19.  Peripheral edema  See #12, #19  IV Lasix and IV albumin per  cardiology, additional dose given on 6/29, transitioned to p.o. Lasix on 6/30, decreased on 7/6  Echo reviewed from 6/9-showing normal EF  BNP elevated  Filed Weights   05/30/20 0408 06/01/20 0600 06/03/20 0659  Weight: 84.3 kg 77.2 kg 83.5 kg   Stable on 7/14 20.  Prolonged QTC  Monitor in conjunction with other medications  Appreciate cards recs 21.  Hypothyroidism  TSH elevated on 6/29 22.  Soft blood pressures   Vitals:   06/02/20 2021 06/03/20 0618  BP: 118/78 (!) 94/54  Pulse: 81 69  Resp: 16 16  Temp: 98.3 F (36.8 C) 98.1 F (36.7 C)  SpO2: 100% 100%   Secondary to diuresis, asymptomatic, ?some improvement on 7/14 23.  Hypokalemia-secondary to diuretics  Potassium 4.0 on 7/12  Supplement increased on 7/1  LOS: 20 days A FACE TO FACE EVALUATION WAS PERFORMED  Johnathan Murray Lorie Phenix 06/03/2020, 8:32 AM

## 2020-06-04 LAB — CBC WITH DIFFERENTIAL/PLATELET
Abs Immature Granulocytes: 0.03 10*3/uL (ref 0.00–0.07)
Basophils Absolute: 0.1 10*3/uL (ref 0.0–0.1)
Basophils Relative: 1 %
Eosinophils Absolute: 0.2 10*3/uL (ref 0.0–0.5)
Eosinophils Relative: 4 %
HCT: 30.6 % — ABNORMAL LOW (ref 39.0–52.0)
Hemoglobin: 9.1 g/dL — ABNORMAL LOW (ref 13.0–17.0)
Immature Granulocytes: 1 %
Lymphocytes Relative: 21 %
Lymphs Abs: 1.2 10*3/uL (ref 0.7–4.0)
MCH: 27.3 pg (ref 26.0–34.0)
MCHC: 29.7 g/dL — ABNORMAL LOW (ref 30.0–36.0)
MCV: 91.9 fL (ref 80.0–100.0)
Monocytes Absolute: 0.5 10*3/uL (ref 0.1–1.0)
Monocytes Relative: 9 %
Neutro Abs: 3.7 10*3/uL (ref 1.7–7.7)
Neutrophils Relative %: 64 %
Platelets: 269 10*3/uL (ref 150–400)
RBC: 3.33 MIL/uL — ABNORMAL LOW (ref 4.22–5.81)
RDW: 17.8 % — ABNORMAL HIGH (ref 11.5–15.5)
WBC: 5.7 10*3/uL (ref 4.0–10.5)
nRBC: 0 % (ref 0.0–0.2)

## 2020-06-04 LAB — GLUCOSE, CAPILLARY: Glucose-Capillary: 131 mg/dL — ABNORMAL HIGH (ref 70–99)

## 2020-06-04 NOTE — Progress Notes (Signed)
Patient discharged home with all personal belongings. Patient wife is driving patient home. Patient is excited to be leaving today. Patient is being taken to personal vehicle by staff via wheelchair. Patient and wife received all discharge instructions and verbalized understanding.

## 2020-06-04 NOTE — Progress Notes (Signed)
[place] PHYSICAL MEDICINE & REHABILITATION PROGRESS NOTE  Subjective/Complaints: Patient seen sitting up in bed this morning.  He states he slept fairly overnight due to a patient across the hall.  He and his wife have questions regarding follow-up appointments.  His wife has questions regarding his knees.  He is eager for discharge.  ROS: Denies CP, SOB, N/V/D  Objective: Vital Signs: Blood pressure 101/60, pulse (!) 103, temperature 98.2 F (36.8 C), resp. rate 16, height 5\' 9"  (1.753 m), weight 84.6 kg, SpO2 100 %. No results found. Recent Labs    06/03/20 0714 06/04/20 0636  WBC 6.7 5.7  HGB 9.4* 9.1*  HCT 31.7* 30.6*  PLT 289 269   No results for input(s): NA, K, CL, CO2, GLUCOSE, BUN, CREATININE, CALCIUM in the last 72 hours.  Physical Exam: BP 101/60 (BP Location: Right Arm)   Pulse (!) 103   Temp 98.2 F (36.8 C)   Resp 16   Ht 5\' 9"  (1.753 m)   Wt 84.6 kg   SpO2 100%   BMI 27.54 kg/m  Constitutional: No distress . Vital signs reviewed. HENT: Normocephalic.  Atraumatic. Eyes: EOMI. No discharge. Cardiovascular: No JVD. Respiratory: Normal effort.  No stridor. GI: Non-distended. Skin: Knees healing Psych: Normal mood.  Normal behavior. Musc: Bilateral knees with edema, improving Neuro: Alert Motor:  Right lower extremity: Hip flexion, knee extension 4-4+/5, ankle dorsiflexion 4+/5, unchanged Left lower extremity: Hip flexion, knee extension 4-4+/5, ankle dorsiflexion 4+/5, unchanged  Assessment/Plan: 1. Functional deficits secondary to debility due to septic knees, disciitis which require 3+ hours per day of interdisciplinary therapy in a comprehensive inpatient rehab setting.  Physiatrist is providing close team supervision and 24 hour management of active medical problems listed below.  Physiatrist and rehab team continue to assess barriers to discharge/monitor patient progress toward functional and medical goals  Care Tool:  Bathing  Bathing  activity did not occur: Refused Body parts bathed by patient: Right arm, Left arm, Chest, Abdomen, Right upper leg, Left upper leg, Face, Front perineal area, Buttocks, Right lower leg, Left lower leg   Body parts bathed by helper: Right lower leg, Left lower leg     Bathing assist Assist Level: Supervision/Verbal cueing     Upper Body Dressing/Undressing Upper body dressing   What is the patient wearing?: Pull over shirt    Upper body assist Assist Level: Set up assist    Lower Body Dressing/Undressing Lower body dressing      What is the patient wearing?: Pants, Underwear/pull up     Lower body assist Assist for lower body dressing: Minimal Assistance - Patient > 75%     Toileting Toileting Toileting Activity did not occur (Clothing management and hygiene only): N/A (no void or bm)  Toileting assist Assist for toileting: Contact Guard/Touching assist     Transfers Chair/bed transfer  Transfers assist     Chair/bed transfer assist level: Supervision/Verbal cueing     Locomotion Ambulation   Ambulation assist      Assist level: Supervision/Verbal cueing Assistive device: Walker-rolling Max distance: 110'   Walk 10 feet activity   Assist     Assist level: Supervision/Verbal cueing Assistive device: Walker-rolling   Walk 50 feet activity   Assist Walk 50 feet with 2 turns activity did not occur: Safety/medical concerns  Assist level: Supervision/Verbal cueing Assistive device: Walker-rolling    Walk 150 feet activity   Assist Walk 150 feet activity did not occur: Safety/medical concerns  Walk 10 feet on uneven surface  activity   Assist Walk 10 feet on uneven surfaces activity did not occur: Safety/medical concerns   Assist level: Contact Guard/Touching assist Assistive device: Aeronautical engineer Will patient use wheelchair at discharge?: No Type of Wheelchair: Manual    Wheelchair assist level:  Supervision/Verbal cueing Max wheelchair distance: 35ft    Wheelchair 50 feet with 2 turns activity    Assist            Wheelchair 150 feet activity     Assist            Medical Problem List and Plan: 1.  Decreased functional mobility secondary to sepsis/Enterococcus faecalis bacteremia/discitis/osteomyelitis of L4-5 status post aortic root replacement 04/17/2020 status post cardioversion for atrial fibrillation.  Sternal precautions/lumbar corset for comfort x 4 weeks.  DC today  Will see patient for transitional care management in 1-2 weeks post-discharge 2.  Antithrombotics: -DVT/anticoagulation: Resumed Eliquis 05/15/2020             -antiplatelet therapy: N/A 3. Pain Management: OxyContin sustained release 10 mg every 12 hours, Lyrica 100 mg 3 times daily Flexeril as as well as oxycodone needed  Controlled with meds on 7/15  Lidoderm patch ordered on 6/30, with improvement 4. Mood: N/A meds- follow for ego support             -antipsychotic agents: N/A 5. Neuropsych: This patient is capable of making decisions on his own behalf. 6. Skin/Wound Care: Routine skin checks 7. Fluids/Electrolytes/Nutrition: Routine in and outs. 8.  ID/Enterococcus bacteremia.  IV ampicillin 2 g every 4 hours as well as Rocephin 2 g every 12 hours completed on 6/30.  Follow-up infectious disease  Continue amoxicillin 9.  Acute on chronic anemia.  Continue iron supplement.    Hb 9.1 on 7/15  Cont to monitor 10.  Bilateral septic knees.  Status post total synovectomy irrigation debridement bilateral knees 05/02/2020 per Dr. Sharol Given.  Weightbearing as tolerated.   Continue ice to bilateral knees  11.  AKI.     Creatinine 1.43 on 7/12  Secondary to diuresis 12.  PAF.  Follow-up cardiology services.  Amiodarone as directed.    Continue Lopressor 12.5 mg twice daily.    Monitor with increased exertion  Repeat ECG reviewed, showing a flutter with prolonged QTC  Recommendations per  cardiology  Will follow up with Cards regarding Afib with RVR-consideration of DCCV as outpatient 13.  Diabetes mellitus with hyperglycemia.  Latest hemoglobin A1c 6.8.    Lantus insulin 10 units twice daily, decreased to 8 units on 7/6, DC'd on 7/14.   CBG (last 3)  Recent Labs    06/03/20 1636 06/03/20 2055 06/04/20 0558  GLUCAP 158* 112* 131*   Discussed with pharmacy, given kidney function, transitioned to Invokana  Monitor with increased mobility 14.  Recent C3-4 ACDF 03/02/2020 per [name] for cervical myelopathy.  Cervical collar discontinued 15.  Hypothyroidism.  Synthroid 16. Drug-induced constipation.  MiraLAX daily, Senokot nightly  Adjust bowel meds as necessary 17.  Severe hypoalbuminemia  Supplement initiated on 6/25  IV supplementation x3 completed 18.  Transaminitis: Resolved  LFTs within normal limits on 6/29 19.  Peripheral edema  See #12, #19  IV Lasix and IV albumin per cardiology, additional dose given on 6/29, transitioned to p.o. Lasix on 6/30, decreased on 7/6  Echo reviewed from 6/9-showing normal EF  BNP elevated  Filed Weights   06/01/20 0600 06/03/20 0659  06/04/20 0600  Weight: 77.2 kg 83.5 kg 84.6 kg   Relatively stable on 7/15 20.  Prolonged QTC  Monitor in conjunction with other medications  Appreciate cards recs 21.  Hypothyroidism  TSH elevated on 6/29 22.  Soft blood pressures   Vitals:   06/03/20 1932 06/04/20 0303  BP: (!) 96/55 101/60  Pulse: 83 (!) 103  Resp:    Temp: 99 F (37.2 C) 98.2 F (36.8 C)  SpO2: 99% 100%   Appreciate cardiology recs  Secondary to diuresis, asymptomatic, stable on 7/15, will need close ambulatory following with potential further adjustments to meds 23.  Hypokalemia-secondary to diuretics  Potassium 4.0 on 7/12, continue to follow as outpatient  Supplement increased on 7/1  > 30 minutes spent in total in discharge planning between myself and PA regarding aforementioned, as well discussion regarding  DME equipment, multiple follow-up appointments, follow-up therapies, discharge medications-particularly new diabetes medication, discharge recommendations-including CBG checks  LOS: 21 days A FACE TO FACE EVALUATION WAS PERFORMED  Chanequa Spees Lorie Phenix 06/04/2020, 8:54 AM

## 2020-06-04 NOTE — Care Management (Signed)
Patient ID: Johnathan Murray, male   DOB: 04-Mar-1951, 69 y.o.   MRN: [id number]  Met with the patient and his wife to review questions about DM and new order for Invokana at discharge. Reviewed medication orders for Invokana as Lantus insulin was discontinued. Discussed medication timing and mechanism for medication effectiveness along with diet and exercise. Discussed Living well with DM with a handbook and given flyer on cooking with seasonings, spices and herbs instead of salt. States an understanding of information reviewed. Margarito Liner

## 2020-06-04 NOTE — Progress Notes (Signed)
Inpatient Rehabilitation Care Coordinator  Discharge Note  The overall goal for the admission was met for:   Discharge location: Yes, Home  Length of Stay: Yes, 21 Days  Discharge activity level: Yes, ambulatory supervision   Home/community participation: Yes  Services provided included: MD, RD, PT, OT, SLP, RN, CM, TR, Pharmacy and [place]: Private Insurance: [employer]  Follow-up services arranged: [facility]  Comments (or additional information): PT  Patient/Family verbalized understanding of follow-up arrangements: Yes  Individual responsible for coordination of the follow-up plan: [name] [phone]  Confirmed correct DME delivered: [name] 06/04/2020    [name]

## 2020-06-05 ENCOUNTER — Telehealth: Payer: Self-pay

## 2020-06-05 NOTE — Telephone Encounter (Signed)
Left message for pt to callback regarding FU from recent procedure regarding afib.

## 2020-06-08 ENCOUNTER — Telehealth: Payer: Self-pay | Admitting: Registered Nurse

## 2020-06-08 DIAGNOSIS — M009 Pyogenic arthritis, unspecified: Secondary | ICD-10-CM | POA: Diagnosis not present

## 2020-06-08 DIAGNOSIS — R7881 Bacteremia: Secondary | ICD-10-CM | POA: Diagnosis not present

## 2020-06-08 DIAGNOSIS — I4891 Unspecified atrial fibrillation: Secondary | ICD-10-CM | POA: Diagnosis not present

## 2020-06-08 DIAGNOSIS — B952 Enterococcus as the cause of diseases classified elsewhere: Secondary | ICD-10-CM | POA: Diagnosis not present

## 2020-06-08 NOTE — Telephone Encounter (Signed)
Transitional Care call Transitional Questions answered by [facility]   Patient name: Johnathan Murray DOB: 1951-10-18 1. Are you/is patient experiencing any problems since coming home? Ms. [name] reports she had trouble obtaining [name], [name]. [place] seen his PCP today and she was able to get his medication issue straighten out she states.  a. Are there any questions regarding any aspect of care? No 2. Are there any questions regarding medications administration/dosing? See above a. Are meds being taken as prescribed? Ms. [name] was driving and unable to review medication list, see above.  b. "Patient should review meds with caller to confirm"  3. Have there been any falls? No 4. Has Home Health been to the house and/or have they contacted you? No , This provider placed a call to [place], they will call ms. [name] to schedule appointment.  a. If not, have you tried to contact them? No, see above b. Can we help you contact them? Yes, see above  5. Are bowels and bladder emptying properly? yes a. Are there any unexpected incontinence issues? (                                  ) b. If applicable, is patient following bowel/bladder programs? (                          ) 6. Any fevers, problems with breathing, unexpected pain? No  7. Are there any skin problems or new areas of breakdown? No 8. Has the patient/family member arranged specialty MD follow up (ie cardiology/neurology/renal/surgical/etc.)?  Ms. [name] was instructed to call Dr Sharol Given  And Dr Cyndia Bent office to schedule HFU appointment, She verbalizes understanding.  a. Can we help arrange? No 9. Does the patient need any other services or support that we can help arrange? No 10. Are caregivers following through as expected in assisting the patient? yes 11. Has the patient quit smoking, drinking alcohol, or using drugs as recommended? (                        )  Appointment date/time 06/11/2020  arrival  time 2:40 for 3:00 appointment with Dr Posey Pronto. At [facility]

## 2020-06-10 ENCOUNTER — Telehealth: Payer: Self-pay

## 2020-06-10 DIAGNOSIS — R2689 Other abnormalities of gait and mobility: Secondary | ICD-10-CM | POA: Diagnosis not present

## 2020-06-10 DIAGNOSIS — M6281 Muscle weakness (generalized): Secondary | ICD-10-CM | POA: Diagnosis not present

## 2020-06-10 NOTE — Telephone Encounter (Signed)
Mr. Drew need an PA on the OxyContin 10 MG.

## 2020-06-11 ENCOUNTER — Encounter: Payer: BC Managed Care – PPO | Admitting: Physical Medicine & Rehabilitation

## 2020-06-15 ENCOUNTER — Ambulatory Visit (INDEPENDENT_AMBULATORY_CARE_PROVIDER_SITE_OTHER): Payer: BC Managed Care – PPO | Admitting: Physician Assistant

## 2020-06-15 ENCOUNTER — Encounter: Payer: Self-pay | Admitting: Orthopedic Surgery

## 2020-06-15 VITALS — Ht 69.0 in | Wt 186.0 lb

## 2020-06-15 DIAGNOSIS — M009 Pyogenic arthritis, unspecified: Secondary | ICD-10-CM

## 2020-06-15 NOTE — Progress Notes (Signed)
Office Visit Note   Patient: Johnathan Murray           Date of Birth: Jun 04, 1951           MRN: [id number] Visit Date: 06/15/2020              Requested by: Nicoletta Dress, MD [place],  [place] [id number] PCP: Nicoletta Dress, MD  Chief Complaint  Patient presents with  . Left Knee - Routine Post Op    05/02/20 bilateral I&D synovectomy   . Right Knee - Routine Post Op      HPI: Patient is 6 weeks status post I&D of bilateral knees and synovectomy.  He has been in rehab and this is his first follow-up.  He takes amoxicillin is being followed by infectious disease.  He also has an appointment scheduled with his spine doctor this week.  His only complaint is that his knees are very weak and they give out on him.  He is about to start outpatient physical therapy.  Assessment & Plan: Visit Diagnoses: No diagnosis found.  Plan: I emphasized the importance of following up with his spine doctor.  Also to begin physical therapy and get a home exercise program.  I did demonstrate straight leg raises to him today.  Follow-up in 4 weeks.  Follow-Up Instructions: No follow-ups on file.   Ortho Exam  Patient is alert, oriented, no adenopathy, well-dressed, normal affect, normal respiratory effort. Patient bilateral knees.  No cellulitis no effusions are noted.  Range of motion is fairly painless.  He has weakness with straight leg raising with quad atrophy left greater than right.  Overall atrophy in his lower extremities no signs of infection  Imaging: No results found. No images are attached to the encounter.  Labs: Lab Results  Component Value Date   HGBA1C 6.8 (H) 02/24/2020   HGBA1C 6.0 (H) 04/18/2019   ESRSEDRATE 30 (H) 05/05/2020   ESRSEDRATE 118 (H) 04/05/2020   CRP 11.4 (H) 05/05/2020   CRP 12.4 (H) 04/05/2020   REPTSTATUS 05/07/2020 FINAL 05/02/2020   GRAMSTAIN  05/02/2020    RARE WBC PRESENT,BOTH PMN AND MONONUCLEAR NO ORGANISMS  SEEN    CULT  05/02/2020    No growth aerobically or anaerobically. Performed at [facility], [place] [address] [place], [place] [address]    LABORGA ENTEROCOCCUS FAECALIS 04/07/2020     Lab Results  Component Value Date   ALBUMIN 2.4 (L) 05/20/2020   ALBUMIN 2.3 (L) 05/19/2020   ALBUMIN 2.0 (L) 05/18/2020    Lab Results  Component Value Date   MG 2.2 05/14/2020   MG 2.1 05/13/2020   MG 2.1 05/12/2020   No results found for: VD25OH  No results found for: PREALBUMIN CBC EXTENDED Latest Ref Rng & Units 06/04/2020 06/03/2020 06/02/2020  WBC 4.0 - 10.5 K/uL 5.7 6.7 6.9  RBC 4.22 - 5.81 MIL/uL 3.33(L) 3.45(L) 3.41(L)  HGB 13.0 - 17.0 g/dL 9.1(L) 9.4(L) 9.3(L)  HCT 39 - 52 % 30.6(L) 31.7(L) 31.7(L)  PLT 150 - 400 K/uL 269 289 280  NEUTROABS 1.7 - 7.7 K/uL 3.7 4.6 5.2  LYMPHSABS 0.7 - 4.0 K/uL 1.2 1.2 0.8     Body mass index is 27.47 kg/m.  Orders:  No orders of the defined types were placed in this encounter.  No orders of the defined types were placed in this encounter.    Procedures: No procedures performed  Clinical Data: No additional findings.  ROS:  All other systems negative, except as noted in the HPI. Review of Systems  Objective: Vital Signs: Ht 5\' 9"  (1.753 m)   Wt 186 lb (84.4 kg)   BMI 27.47 kg/m   Specialty Comments:  No specialty comments available.  PMFS History: Patient Active Problem List   Diagnosis Date Noted  . Drug-induced hypotension   . Hypotension due to drugs   . Hypokalemia   . Hypotension due to hypovolemia   . Anasarca   . History of prolonged Q-T interval on ECG   . Peripheral edema   . Labile blood glucose   . Atrial fibrillation with RVR ([place])   . Postoperative pain   . Transaminitis   . Hypoalbuminemia due to protein-calorie malnutrition ([place])   . Drug induced constipation   . Controlled type 2 diabetes mellitus with hyperglycemia, with long-term current use of insulin ([place])   . AKI (acute kidney injury)  ([place])   . Pressure injury of skin 05/14/2020  . Debility 05/14/2020  . Lumbar discitis 05/04/2020  . Acute blood loss anemia 05/02/2020  . Prolonged QT interval 05/02/2020  . Transient hypotension 05/02/2020  . Persistent atrial fibrillation ([place])   . Prosthetic valve endocarditis ([place]) 04/13/2020  . Aortic valve abscess 04/13/2020  . Endocarditis 04/13/2020  . Typical atrial flutter ([place])   . Discitis 04/07/2020  . Moderate protein-calorie malnutrition ([place]) 03/24/2020  . Septic arthritis of knee, bilateral ([place]) 03/23/2020  . History of fusion of cervical spine 03/23/2020  . Bacteremia due to Enterococcus 03/21/2020  . Pseudogout of left knee 03/21/2020  . ARF (acute renal failure) ([place]) 03/20/2020  . S/P AVR 01/17/2020  . Renal insufficiency 01/17/2020  . Pre-operative cardiovascular examination 01/03/2020  . Acute otitis externa of left ear 11/13/2019  . Acute suppurative otitis media of left ear without spontaneous rupture of tympanic membrane 11/13/2019  . Conductive hearing loss of left ear 11/13/2019  . Tinnitus of left ear 11/13/2019  . Lumbar stenosis with neurogenic claudication 06/11/2019  . Cervical myelopathy ([place]) 04/22/2019  . Preoperative cardiovascular examination 04/17/2019  . Intermittent claudication ([place]) 10/31/2018  . Coronary artery disease of native artery of native heart with stable angina pectoris ([place])   . Abnormal nuclear stress test 05/03/2018  . Aortic stenosis 05/03/2018  . Acid reflux 02/15/2018  . Arthritis 02/15/2018  . Hyperactive gag reflex 02/15/2018  . Macular degeneration 02/15/2018  . Abnormal LFTs (liver function tests) 10/31/2016  . Coronary artery disease involving native coronary artery of native heart without angina pectoris 05/30/2016  . Diabetes mellitus due to underlying condition with unspecified complications ([place]) 52/84/1324  . Dyslipidemia 05/30/2016  . Essential hypertension 05/30/2016  . PAF (paroxysmal atrial  fibrillation) ([place]) 05/30/2016   Past Medical History:  Diagnosis Date  . Abnormal LFTs (liver function tests) 10/31/2016  . Abnormal nuclear stress test 05/03/2018  . Acid reflux 02/15/2018   not at present  . Acute otitis externa of left ear 11/13/2019  . Acute suppurative otitis media of left ear without spontaneous rupture of tympanic membrane 11/13/2019  . Anemia    low iron  . Aortic stenosis 05/03/2018  . Aortic valve abscess 04/13/2020  . ARF (acute renal failure) ([place]) 03/20/2020  . Arthritis 02/15/2018   Overview:  shoulder  . Bacteremia due to Enterococcus 03/21/2020  . Cervical myelopathy ([place]) 04/22/2019  . Conductive hearing loss of left ear 11/13/2019  . Coronary artery disease involving native coronary artery of native heart without angina pectoris 05/30/2016  .  Coronary artery disease of native artery of native heart with stable angina pectoris ([place])    pt. denies  . Diabetes mellitus due to underlying condition with unspecified complications ([place]) 2/42/6834  . Discitis 04/07/2020  . Dyslipidemia 05/30/2016  . Dyspnea    upon exertion,  as of 06/05/09 not having any recent chest pain  . Dysrhythmia    a fib  . Endocarditis 04/13/2020  . Essential hypertension 05/30/2016  . Heart murmur    Has had an Aortic valve replacement  . History of fusion of cervical spine 03/23/2020  . Hyperactive gag reflex 02/15/2018  . Hypothyroidism   . Intermittent claudication ([place]) 10/31/2018  . Lumbar stenosis with neurogenic claudication 06/11/2019  . Macular degeneration 02/15/2018  . PAF (paroxysmal atrial fibrillation) ([place]) 05/30/2016  . Pre-operative cardiovascular examination 01/03/2020  . Preoperative cardiovascular examination 04/17/2019  . Prosthetic valve endocarditis ([place]) 04/13/2020  . Protein-calorie malnutrition, severe 03/24/2020  . Pseudogout of left knee 03/21/2020  . Renal insufficiency 01/17/2020  . S/P aortic valve replacement 01/17/2020  . S/P AVR 01/17/2020  . Septic arthritis  ([place]) 03/23/2020  . Tinnitus of left ear 11/13/2019  . Typical atrial flutter ([facility])     Family History  Problem Relation Age of Onset  . Prostate cancer Brother   . Heart disease Maternal Grandmother   . Brain cancer Mother     Past Surgical History:  Procedure Laterality Date  . ANTERIOR CERVICAL DECOMP/DISCECTOMY FUSION N/A 04/22/2019   Procedure: Anterior Cervical Discectomy and Fusion, Cervical four-five, Cervical five-six;  Surgeon: Earnie Larsson, MD;  Location: [facility];  Service: Neurosurgery;  Laterality: N/A;  . ANTERIOR CERVICAL DECOMP/DISCECTOMY FUSION N/A 03/02/2020   Procedure: Anterior Cervical Decompression Fusion  - Cervical three-Cervical four;  Surgeon: Earnie Larsson, MD;  Location: [facility];  Service: Neurosurgery;  Laterality: N/A;  . AORTIC VALVE REPLACEMENT    . ASCENDING AORTIC ROOT REPLACEMENT N/A 04/16/2020   Procedure: ASCENDING AORTIC ROOT REPLACEMENT - HOMOGRAFT using [place]ie 23 MM Cryo Aortic Valve.;  Surgeon: Gaye Pollack, MD;  Location: [facility];  Service: Open Heart Surgery;  Laterality: N/A;  . CARDIAC CATHETERIZATION     several stents  . CARDIAC SURGERY    . CARDIOVERSION N/A 04/13/2020   Procedure: CARDIOVERSION;  Surgeon: Geralynn Rile, MD;  Location: [facility];  Service: Cardiovascular;  Laterality: N/A;  . CARDIOVERSION N/A 04/29/2020   Procedure: CARDIOVERSION;  Surgeon: Skeet Latch, MD;  Location[facility] ENDOSCOPY;  Service: Cardiovascular;  Laterality: N/A;  . CORONARY ARTERY BYPASS GRAFT  2017   patient unsure how many, had at [facility]  . HAND SURGERY    . I & D EXTREMITY Bilateral 05/02/2020   Procedure: IRRIGATION AND DEBRIDEMENT BILATERAL KNEES;  Surgeon: Newt Minion, MD;  Location: [facility];  Service: Orthopedics;  Laterality: Bilateral;  . IR LUMBAR DISC ASPIRATION W/IMG GUIDE  04/07/2020  . IRRIGATION AND DEBRIDEMENT KNEE Bilateral 03/24/2020   Procedure: ARTHROSCOPY IRRIGATION AND DEBRIDEMENT BILATERAL  KNEE;  Surgeon:  Marchia Bond, MD;  Location: [facility];  Service: Orthopedics;  Laterality: Bilateral;  . KNEE SURGERY    . LEFT HEART CATH AND CORONARY ANGIOGRAPHY N/A 01/21/2020   Procedure: LEFT HEART CATH AND CORONARY ANGIOGRAPHY;  Surgeon: Belva Crome, MD;  Location: [facility];  Service: Cardiovascular;  Laterality: N/A;  . LEFT HEART CATH AND CORS/GRAFTS ANGIOGRAPHY N/A 05/16/2018   Procedure: LEFT HEART CATH AND CORS/GRAFTS ANGIOGRAPHY;  Surgeon: Burnell Blanks, MD;  Location: [facility];  Service: Cardiovascular;  Laterality: N/A;  . LUMBAR LAMINECTOMY/DECOMPRESSION MICRODISCECTOMY Bilateral 06/11/2019   Procedure: Bilateral Lumbar Four-FiveLaminectomy/Foraminotomy;  Surgeon: Earnie Larsson, MD;  Location: [place] [facility];  Service: Neurosurgery;  Laterality: Bilateral;  posterior  . SYNOVECTOMY Bilateral 05/02/2020   Procedure: SYNOVECTOMY;  Surgeon: Newt Minion, MD;  Location: [facility];  Service: Orthopedics;  Laterality: Bilateral;  . TEE WITHOUT CARDIOVERSION N/A 03/26/2020   Procedure: TRANSESOPHAGEAL ECHOCARDIOGRAM (TEE);  Surgeon: Jerline Pain, MD;  Location: [facility] [place] ENDOSCOPY;  Service: Cardiovascular;  Laterality: N/A;  . TEE WITHOUT CARDIOVERSION N/A 04/13/2020   Procedure: TRANSESOPHAGEAL ECHOCARDIOGRAM (TEE);  Surgeon: Geralynn Rile, MD;  Location: [facility];  Service: Cardiovascular;  Laterality: N/A;  . TEE WITHOUT CARDIOVERSION N/A 04/16/2020   Procedure: TRANSESOPHAGEAL ECHOCARDIOGRAM (TEE);  Surgeon: Gaye Pollack, MD;  Location: [facility];  Service: Open Heart Surgery;  Laterality: N/A;  . TEE WITHOUT CARDIOVERSION N/A 04/29/2020   Procedure: TRANSESOPHAGEAL ECHOCARDIOGRAM (TEE);  Surgeon: Skeet Latch, MD;  Location: [facility] ENDOSCOPY;  Service: Cardiovascular;  Laterality: N/A;   Social History   Occupational History    Employer: [employer]  Tobacco Use  . Smoking status: Never Smoker  . Smokeless tobacco: Never Used  Vaping Use  . Vaping Use: Never used    Substance and Sexual Activity  . Alcohol use: Not Currently  . Drug use: Never  . Sexual activity: Not Currently

## 2020-06-16 ENCOUNTER — Other Ambulatory Visit: Payer: Self-pay | Admitting: Surgery

## 2020-06-16 DIAGNOSIS — Z952 Presence of prosthetic heart valve: Secondary | ICD-10-CM

## 2020-06-16 DIAGNOSIS — M6281 Muscle weakness (generalized): Secondary | ICD-10-CM | POA: Diagnosis not present

## 2020-06-16 DIAGNOSIS — R2689 Other abnormalities of gait and mobility: Secondary | ICD-10-CM | POA: Diagnosis not present

## 2020-06-17 ENCOUNTER — Encounter: Payer: Self-pay | Admitting: Surgery

## 2020-06-17 ENCOUNTER — Other Ambulatory Visit: Payer: Self-pay

## 2020-06-17 ENCOUNTER — Ambulatory Visit (INDEPENDENT_AMBULATORY_CARE_PROVIDER_SITE_OTHER): Payer: BC Managed Care – PPO | Admitting: Internal Medicine

## 2020-06-17 ENCOUNTER — Ambulatory Visit
Admission: RE | Admit: 2020-06-17 | Discharge: 2020-06-17 | Disposition: A | Discharge status: Home / Self Care | Payer: BC Managed Care – PPO | Source: Ambulatory Visit | Attending: Surgery | Admitting: Surgery

## 2020-06-17 ENCOUNTER — Ambulatory Visit (INDEPENDENT_AMBULATORY_CARE_PROVIDER_SITE_OTHER): Payer: Self-pay | Admitting: Surgery

## 2020-06-17 ENCOUNTER — Encounter: Payer: Self-pay | Admitting: Internal Medicine

## 2020-06-17 VITALS — BP 95/64 | HR 103 | Temp 98.1°F | Resp 20 | Ht 69.0 in | Wt 178.0 lb

## 2020-06-17 DIAGNOSIS — I38 Endocarditis, valve unspecified: Secondary | ICD-10-CM

## 2020-06-17 DIAGNOSIS — R0602 Shortness of breath: Secondary | ICD-10-CM | POA: Diagnosis not present

## 2020-06-17 DIAGNOSIS — T826XXD Infection and inflammatory reaction due to cardiac valve prosthesis, subsequent encounter: Secondary | ICD-10-CM

## 2020-06-17 DIAGNOSIS — B952 Enterococcus as the cause of diseases classified elsewhere: Secondary | ICD-10-CM | POA: Diagnosis not present

## 2020-06-17 DIAGNOSIS — M00861 Arthritis due to other bacteria, right knee: Secondary | ICD-10-CM | POA: Diagnosis not present

## 2020-06-17 DIAGNOSIS — M4646 Discitis, unspecified, lumbar region: Secondary | ICD-10-CM

## 2020-06-17 DIAGNOSIS — M009 Pyogenic arthritis, unspecified: Secondary | ICD-10-CM

## 2020-06-17 DIAGNOSIS — M00862 Arthritis due to other bacteria, left knee: Secondary | ICD-10-CM

## 2020-06-17 DIAGNOSIS — R7881 Bacteremia: Secondary | ICD-10-CM | POA: Diagnosis not present

## 2020-06-17 DIAGNOSIS — Z952 Presence of prosthetic heart valve: Secondary | ICD-10-CM

## 2020-06-17 MED ORDER — AMOXICILLIN 500 MG PO CAPS: 500.0000 mg | ORAL_CAPSULE | Freq: Two times a day (BID) | ORAL | 11 refills | Status: DC

## 2020-06-17 NOTE — Assessment & Plan Note (Signed)
I recommend that he continue amoxicillin for now.  I will repeat his inflammatory markers.

## 2020-06-17 NOTE — Assessment & Plan Note (Signed)
He has improved following 3 months of antibiotic therapy and redo aortic root replacement.  I told him again that the only way we would ever know if he has been cured would be to stop antibiotic therapy and see what happens.  Since he is tolerating amoxicillin I recommend that he continue it for now.

## 2020-06-17 NOTE — Progress Notes (Signed)
HPI: Patient returns for routine postoperative follow-up having undergone redo median sternotomy for debridement of an aortic root abscess and pseudoaneurysm with aortic root replacement using a 23 mm homograft aortic root on 04/16/2020.  He had previously undergone aortic valve replacement with a pericardial valve and coronary bypass x3 at [facility] in September 2017.  He now presented with enterococcal bacteremia with prosthetic aortic valve endocarditis and aortic root abscess in addition to septic arthritis of both knees and spinal osteomyelitis.  The patient's early postoperative recovery while in the hospital was notable for a slow postoperative course due to debilitation from his bilateral knee septic arthritis and spinal osteomyelitis with severe back pain.  He eventually improved enough to go to inpatient rehab on 05/14/2020 and was discharged home on 06/04/2020.  He continues on amoxicillin and has an appointment later today with infectious disease. Since hospital discharge the patient reports that he has been slowly improving.  He denies any fever or chills.  His appetite has been good.  He has been working on his lower body strength with physical therapy.  He did have a fall last week due to his knees giving out.  He has been using a walker at home.  His wife brought him to the appointment today.   Current Outpatient Medications  Medication Sig Dispense Refill   amiodarone (PACERONE) 200 MG tablet Take 1 tablet (200 mg total) by mouth daily. 30 tablet 1   amoxicillin (AMOXIL) 500 MG capsule Take 1 capsule (500 mg total) by mouth 2 (two) times daily. 60 capsule 3   apixaban (ELIQUIS) 5 MG TABS tablet Take 1 tablet (5 mg total) by mouth 2 (two) times daily. 60 tablet 1   blood glucose meter kit and supplies Dispense based on patient and insurance preference. Use up to four times daily as directed. (FOR ICD-10 E10.9, E11.9). 1 each 0   cyclobenzaprine (FLEXERIL) 5 MG tablet Take  1 tablet (5 mg total) by mouth 3 (three) times daily as needed for muscle spasms. 60 tablet 0   Empagliflozin (JARDIANCE PO) Take 1 tablet by mouth daily. Unsure of dose     ezetimibe (ZETIA) 10 MG tablet Take 1 tablet (10 mg total) by mouth at bedtime. 30 tablet 0   ferrous sulfate 325 (65 FE) MG tablet Take 1 tablet (325 mg total) by mouth 2 (two) times daily with a meal. 60 tablet 3   furosemide (LASIX) 20 MG tablet Take 3 tablets (60 mg total) by mouth daily. 90 tablet 1   levothyroxine (SYNTHROID) 25 MCG tablet Take 1 tablet (25 mcg total) by mouth daily before breakfast. 30 tablet 0   lidocaine (LIDODERM) 5 % Place 2 patches onto the skin daily. Remove & Discard patch within 12 hours or as directed by MD 30 patch 0   metoprolol tartrate (LOPRESSOR) 25 MG tablet Take 0.5 tablets (12.5 mg total) by mouth 2 (two) times daily. 60 tablet 1   oxyCODONE (OXY IR/ROXICODONE) 5 MG immediate release tablet Take 1 tablet (5 mg total) by mouth every 4 (four) hours as needed for severe pain. 30 tablet 0   polyethylene glycol (MIRALAX / GLYCOLAX) 17 g packet Take 17 g by mouth daily. 14 each 0   potassium chloride SA (KLOR-CON) 20 MEQ tablet Take 1 tablet (20 mEq total) by mouth 2 (two) times daily. 60 tablet 0   pregabalin (LYRICA) 100 MG capsule Take 1 capsule (100 mg total) by mouth 3 (three) times daily. [place]  capsule 0   senna (SENOKOT) 8.6 MG TABS tablet Take 1 tablet (8.6 mg total) by mouth at bedtime. 120 tablet 0   vitamin B-12 (CYANOCOBALAMIN) 1000 MCG tablet Take 1,000 mcg by mouth daily.     vitamin C (ASCORBIC ACID) 500 MG tablet Take 500 mg by mouth daily.      zinc gluconate 50 MG tablet Take 50 mg by mouth daily.     acetaminophen (TYLENOL) 325 MG tablet Take 2 tablets (650 mg total) by mouth every 6 (six) hours as needed for mild pain (or Fever >/= 101).     oxyCODONE (OXYCONTIN) 10 mg 12 hr tablet Take 1 tablet (10 mg total) by mouth every 12 (twelve) hours. (Patient not  taking: Reported on 06/17/2020) 30 tablet 0   No current facility-administered medications for this visit.    Physical Exam: BP (!) 95/64 (BP Location: Right Arm, Patient Position: Sitting, Cuff Size: Normal)    Pulse 103    Temp 98.1 F (36.7 C) (Temporal)    Resp 20    Ht '5\' 9"'$  (1.753 m)    Wt 178 lb (80.7 kg)    SpO2 99% Comment: RA   BMI 26.29 kg/m  He looks well.  He is sitting in a wheelchair.   Cardiac exam shows a regular rate and rhythm with normal heart sounds.  There is no murmur. Lungs are clear. The chest incision is healing well and the sternum is stable. There is some swelling in both knees which are in braces. There is some mild swelling just to the right of his spine in his mid back which has been present since hospitalization. There is very mild bilateral lower extremity edema.  Diagnostic Tests:  CLINICAL DATA:  Status post aortic valve repair. Shortness of breath.  EXAM: CHEST - 2 VIEW  COMPARISON:  April 26, 2020.  FINDINGS: The heart size and mediastinal contours are within normal limits. Both lungs are clear. No pneumothorax or pleural effusion is noted. Sternotomy wires are noted. The visualized skeletal structures are unremarkable.  IMPRESSION: No active cardiopulmonary disease.   Electronically Signed   By: Marijo Conception M.D.   On: 06/17/2020 09:20  Impression:  Overall I think he is making good progress following redo sternotomy for aortic root replacement with a homograft for prosthetic valve endocarditis and aortic root abscess.  His main limitation now is lower extremity muscle weakness due to prolonged hospitalization and bedrest due to his bilateral knee septic arthritis and lumbar osteomyelitis/discitis.  This should improve with continued physical therapy and attention to nutrition.  He will continue antibiotics as recommended by infectious disease.  He will require repeat 2D echocardiogram in about 3 months to follow-up on his  homograft aortic valve as a new baseline.  Plan:  He will continue to follow-up with infectious disease and cardiology.  I will be happy to see him back if the need arises.   Gaye Pollack, MD [facility] [phone]

## 2020-06-17 NOTE — Progress Notes (Signed)
[name]  Patient Active Problem List   Diagnosis Date Noted  . Lumbar discitis 05/04/2020    Priority: High  . Prosthetic valve endocarditis ([place]) 04/13/2020    Priority: High  . Septic arthritis of knee, bilateral ([place]) 03/23/2020    Priority: High  . Bacteremia due to Enterococcus 03/21/2020    Priority: High  . S/P AVR 01/17/2020    Priority: High  . History of fusion of cervical spine 03/23/2020    Priority: Medium  . Drug-induced hypotension   . Hypotension due to drugs   . Hypokalemia   . Hypotension due to hypovolemia   . Anasarca   . History of prolonged Q-T interval on ECG   . Peripheral edema   . Labile blood glucose   . Atrial fibrillation with RVR ([place])   . Postoperative pain   . Transaminitis   . Hypoalbuminemia due to protein-calorie malnutrition ([place])   . Drug induced constipation   . Controlled type 2 diabetes mellitus with hyperglycemia, with long-term current use of insulin ([place])   . AKI (acute kidney injury) ([place])   . Pressure injury of skin 05/14/2020  . Debility 05/14/2020  . Acute blood loss anemia 05/02/2020  . Prolonged QT interval 05/02/2020  . Transient hypotension 05/02/2020  . Persistent atrial fibrillation ([place])   . Aortic valve abscess 04/13/2020  . Endocarditis 04/13/2020  . Typical atrial flutter ([place])   . Discitis 04/07/2020  . Moderate protein-calorie malnutrition ([place]) 03/24/2020  . Pseudogout of left knee 03/21/2020  . ARF (acute renal failure) ([place]) 03/20/2020  . Renal insufficiency 01/17/2020  . Pre-operative cardiovascular examination 01/03/2020  . Acute otitis externa of left ear 11/13/2019  . Acute suppurative otitis media of left ear without spontaneous rupture of tympanic membrane 11/13/2019  . Conductive hearing loss of left ear 11/13/2019  . Tinnitus of left ear 11/13/2019  . Lumbar stenosis with neurogenic claudication 06/11/2019  . Cervical myelopathy ([place]) 04/22/2019  .  Preoperative cardiovascular examination 04/17/2019  . Intermittent claudication ([place]) 10/31/2018  . Coronary artery disease of native artery of native heart with stable angina pectoris ([place])   . Abnormal nuclear stress test 05/03/2018  . Aortic stenosis 05/03/2018  . Acid reflux 02/15/2018  . Arthritis 02/15/2018  . Hyperactive gag reflex 02/15/2018  . Macular degeneration 02/15/2018  . Abnormal LFTs (liver function tests) 10/31/2016  . Coronary artery disease involving native coronary artery of native heart without angina pectoris 05/30/2016  . Diabetes mellitus due to underlying condition with unspecified complications ([place]) 52/48/1859  . Dyslipidemia 05/30/2016  . Essential hypertension 05/30/2016  . PAF (paroxysmal atrial fibrillation) ([place]) 05/30/2016    Patient's Medications  New Prescriptions   No medications on file  Previous Medications   ACETAMINOPHEN (TYLENOL) 325 MG TABLET    Take 2 tablets (650 mg total) by mouth every 6 (six) hours as needed for mild pain (or Fever >/= 101).   AMIODARONE (PACERONE) 200 MG TABLET    Take 1 tablet (200 mg total) by mouth daily.   APIXABAN (ELIQUIS) 5 MG TABS TABLET    Take 1 tablet (5 mg total) by mouth 2 (two) times daily.   BLOOD GLUCOSE METER KIT AND SUPPLIES    Dispense based on patient and insurance preference. Use up to four times daily as directed. (FOR ICD-10 E10.9, E11.9).   CYCLOBENZAPRINE (FLEXERIL) 5 MG TABLET    Take 1 tablet (5 mg total) by mouth 3 (three) times  daily as needed for muscle spasms.   EMPAGLIFLOZIN (JARDIANCE PO)    Take 1 tablet by mouth daily. Unsure of dose   EZETIMIBE (ZETIA) 10 MG TABLET    Take 1 tablet (10 mg total) by mouth at bedtime.   FERROUS SULFATE 325 (65 FE) MG TABLET    Take 1 tablet (325 mg total) by mouth 2 (two) times daily with a meal.   FUROSEMIDE (LASIX) 20 MG TABLET    Take 3 tablets (60 mg total) by mouth daily.   LEVOTHYROXINE (SYNTHROID) 25 MCG TABLET    Take 1 tablet (25 mcg total) by  mouth daily before breakfast.   LIDOCAINE (LIDODERM) 5 %    Place 2 patches onto the skin daily. Remove & Discard patch within 12 hours or as directed by MD   METOPROLOL TARTRATE (LOPRESSOR) 25 MG TABLET    Take 0.5 tablets (12.5 mg total) by mouth 2 (two) times daily.   OXYCODONE (OXY IR/ROXICODONE) 5 MG IMMEDIATE RELEASE TABLET    Take 1 tablet (5 mg total) by mouth every 4 (four) hours as needed for severe pain.   OXYCODONE (OXYCONTIN) 10 MG 12 HR TABLET    Take 1 tablet (10 mg total) by mouth every 12 (twelve) hours.   POLYETHYLENE GLYCOL (MIRALAX / GLYCOLAX) 17 G PACKET    Take 17 g by mouth daily.   POTASSIUM CHLORIDE SA (KLOR-CON) 20 MEQ TABLET    Take 1 tablet (20 mEq total) by mouth 2 (two) times daily.   PREGABALIN (LYRICA) 100 MG CAPSULE    Take 1 capsule (100 mg total) by mouth 3 (three) times daily.   SENNA (SENOKOT) 8.6 MG TABS TABLET    Take 1 tablet (8.6 mg total) by mouth at bedtime.   VITAMIN B-12 (CYANOCOBALAMIN) 1000 MCG TABLET    Take 1,000 mcg by mouth daily.   VITAMIN C (ASCORBIC ACID) 500 MG TABLET    Take 500 mg by mouth daily.    ZINC GLUCONATE 50 MG TABLET    Take 50 mg by mouth daily.  Modified Medications   Modified Medication Previous Medication   AMOXICILLIN (AMOXIL) 500 MG CAPSULE amoxicillin (AMOXIL) 500 MG capsule      Take 1 capsule (500 mg total) by mouth 2 (two) times daily.    Take 1 capsule (500 mg total) by mouth 2 (two) times daily.  Discontinued Medications   No medications on file    Subjective: Johnathan Murray is in for his hospital follow-up visit.  He developed enterococcal bacteremia 3 months ago complicated by widely disseminated infection.  He previously had prosthetic aortic valve surgery and developed prosthetic valve endocarditis and an aortic root abscess, bilateral knee septic arthritis and lumbar infection.  He underwent redo aortic root implant and knee surgery.  He completed a lengthy course of IV ceftriaxone and ampicillin 05/20/2020 before  converting to oral amoxicillin.  He came home from rehab about 1 week ago.  Overall, he is feeling better.  He says that his bed at home is too firm and he has been sleeping on his couch.  He has not had any problems tolerating his amoxicillin.  Review of Systems: Review of Systems  Constitutional: Positive for malaise/fatigue and weight loss. Negative for chills, diaphoresis and fever.  HENT: Negative for congestion and sore throat.   Respiratory: Negative for cough and shortness of breath.   Cardiovascular: Negative for chest pain.  Gastrointestinal: Positive for constipation. Negative for abdominal pain, diarrhea, nausea and vomiting.  Genitourinary:  Negative for dysuria.  Musculoskeletal: Positive for back pain and joint pain.  Skin: Negative for rash.  Neurological: Negative for headaches.    Past Medical History:  Diagnosis Date  . Abnormal LFTs (liver function tests) 10/31/2016  . Abnormal nuclear stress test 05/03/2018  . Acid reflux 02/15/2018   not at present  . Acute otitis externa of left ear 11/13/2019  . Acute suppurative otitis media of left ear without spontaneous rupture of tympanic membrane 11/13/2019  . Anemia    low iron  . Aortic stenosis 05/03/2018  . Aortic valve abscess 04/13/2020  . ARF (acute renal failure) ([place]) 03/20/2020  . Arthritis 02/15/2018   Overview:  shoulder  . Bacteremia due to Enterococcus 03/21/2020  . Cervical myelopathy ([place]) 04/22/2019  . Conductive hearing loss of left ear 11/13/2019  . Coronary artery disease involving native coronary artery of native heart without angina pectoris 05/30/2016  . Coronary artery disease of native artery of native heart with stable angina pectoris ([place])    pt. denies  . Diabetes mellitus due to underlying condition with unspecified complications ([place]) 8/84/1660  . Discitis 04/07/2020  . Dyslipidemia 05/30/2016  . Dyspnea    upon exertion,  as of 06/05/09 not having any recent chest pain  . Dysrhythmia    a fib  .  Endocarditis 04/13/2020  . Essential hypertension 05/30/2016  . Heart murmur    Has had an Aortic valve replacement  . History of fusion of cervical spine 03/23/2020  . Hyperactive gag reflex 02/15/2018  . Hypothyroidism   . Intermittent claudication ([place]) 10/31/2018  . Lumbar stenosis with neurogenic claudication 06/11/2019  . Macular degeneration 02/15/2018  . PAF (paroxysmal atrial fibrillation) ([place]) 05/30/2016  . Pre-operative cardiovascular examination 01/03/2020  . Preoperative cardiovascular examination 04/17/2019  . Prosthetic valve endocarditis ([place]) 04/13/2020  . Protein-calorie malnutrition, severe 03/24/2020  . Pseudogout of left knee 03/21/2020  . Renal insufficiency 01/17/2020  . S/P aortic valve replacement 01/17/2020  . S/P AVR 01/17/2020  . Septic arthritis ([place]) 03/23/2020  . Tinnitus of left ear 11/13/2019  . Typical atrial flutter ([facility])     Social History   Tobacco Use  . Smoking status: Never Smoker  . Smokeless tobacco: Never Used  Vaping Use  . Vaping Use: Never used  Substance Use Topics  . Alcohol use: Not Currently  . Drug use: Never    Family History  Problem Relation Age of Onset  . Prostate cancer Brother   . Heart disease Maternal Grandmother   . Brain cancer Mother     Allergies  Allergen Reactions  . Atorvastatin Other (See Comments)    Urinary retention   . Nebivolol Diarrhea    Objective: Vitals:   06/17/20 1003  BP: 108/74  Pulse: (!) 106  Temp: 97.9 F (36.6 C)  TempSrc: Oral  Weight: 178 lb (80.7 kg)   Body mass index is 26.29 kg/m.  Physical Exam Constitutional:      Comments: He is seated in his wheelchair.  He is accompanied by his wife.  His weight is down 12 pounds over the past 3 months.  Cardiovascular:     Rate and Rhythm: Normal rate and regular rhythm.     Heart sounds: No murmur heard.   Pulmonary:     Effort: Pulmonary effort is normal.     Breath sounds: Normal breath sounds.  Abdominal:     Palpations: Abdomen  is soft.     Tenderness: There is no abdominal tenderness.  Musculoskeletal:  General: Tenderness present. No swelling.     Comments: He has some swelling just to the right of his spine in his mid back.  There is no redness, fluctuance or tenderness with palpation.  Skin:    Findings: Bruising present. No rash.  Neurological:     General: No focal deficit present.  Psychiatric:        Mood and Affect: Mood normal.     Lab Results Sed Rate (mm/hr)  Date Value  05/05/2020 30 (H)  04/05/2020 118 (H)   CRP (mg/dL)  Date Value  05/05/2020 11.4 (H)  04/05/2020 12.4 (H)     Problem List Items Addressed This Visit      High   Septic arthritis of knee, bilateral ([place])    I recommend that he continue amoxicillin for now.  I will repeat his inflammatory markers.      Prosthetic valve endocarditis ([place])    He has improved following 3 months of antibiotic therapy and redo aortic root replacement.  I told him again that the only way we would ever know if he has been cured would be to stop antibiotic therapy and see what happens.  Since he is tolerating amoxicillin I recommend that he continue it for now.      Lumbar discitis    I recommend that he continue amoxicillin for now.  I will repeat his inflammatory markers.      Bacteremia due to Enterococcus    I think his bacteremia has been cured following his lengthy course of IV antibiotics.      Relevant Orders   CBC   Basic metabolic panel   C-reactive protein   Sedimentation rate       Johnathan Bickers, MD [facility] [place] [phone] pager   [phone] cell 06/17/2020, 10:33 AM

## 2020-06-17 NOTE — Assessment & Plan Note (Signed)
I think his bacteremia has been cured following his lengthy course of IV antibiotics.

## 2020-06-18 DIAGNOSIS — M6281 Muscle weakness (generalized): Secondary | ICD-10-CM | POA: Diagnosis not present

## 2020-06-18 DIAGNOSIS — R2689 Other abnormalities of gait and mobility: Secondary | ICD-10-CM | POA: Diagnosis not present

## 2020-06-18 LAB — CBC
HCT: 35.8 % — ABNORMAL LOW (ref 38.5–50.0)
Hemoglobin: 11.4 g/dL — ABNORMAL LOW (ref 13.2–17.1)
MCH: 28.1 pg (ref 27.0–33.0)
MCHC: 31.8 g/dL — ABNORMAL LOW (ref 32.0–36.0)
MCV: 88.4 fL (ref 80.0–100.0)
MPV: 11.2 fL (ref 7.5–12.5)
Platelets: 264 10*3/uL (ref 140–400)
RBC: 4.05 10*6/uL — ABNORMAL LOW (ref 4.20–5.80)
RDW: 15.8 % — ABNORMAL HIGH (ref 11.0–15.0)
WBC: 6.2 10*3/uL (ref 3.8–10.8)

## 2020-06-18 LAB — BASIC METABOLIC PANEL
BUN/Creatinine Ratio: 20 (calc) (ref 6–22)
BUN: 32 mg/dL — ABNORMAL HIGH (ref 7–25)
CO2: 27 mmol/L (ref 20–32)
Calcium: 9.6 mg/dL (ref 8.6–10.3)
Chloride: 102 mmol/L (ref 98–110)
Creat: 1.59 mg/dL — ABNORMAL HIGH (ref 0.70–1.25)
Glucose, Bld: 140 mg/dL — ABNORMAL HIGH (ref 65–99)
Potassium: 4.5 mmol/L (ref 3.5–5.3)
Sodium: 140 mmol/L (ref 135–146)

## 2020-06-18 LAB — C-REACTIVE PROTEIN: CRP: 40.8 mg/L — ABNORMAL HIGH (ref <8.0)

## 2020-06-18 LAB — SEDIMENTATION RATE: Sed Rate: 19 mm/h (ref 0–20)

## 2020-06-19 ENCOUNTER — Telehealth: Payer: Self-pay | Admitting: *Deleted

## 2020-06-19 NOTE — Telephone Encounter (Signed)
[id number]  Status:Approved  Review Type:Prior Auth  Coverage Start Date:05/18/2020 Coverage  End Date:06/17/2021

## 2020-06-19 NOTE — Telephone Encounter (Signed)
Mrs Nohr called with a request to get a call back.  She has a question about PT.  I called her back and spoke with her. Mr Fults has fallen twice since being home. She has found a [place] agency that will take them in network. [facility] [phone], and her contact was Algis Greenhouse.  I have emailed this information to Erlene Quan MSW on inpt rehab to arrange.

## 2020-06-22 NOTE — Telephone Encounter (Signed)
Thanks

## 2020-06-23 DIAGNOSIS — R2689 Other abnormalities of gait and mobility: Secondary | ICD-10-CM | POA: Diagnosis not present

## 2020-06-23 DIAGNOSIS — M6281 Muscle weakness (generalized): Secondary | ICD-10-CM | POA: Diagnosis not present

## 2020-06-24 ENCOUNTER — Other Ambulatory Visit: Payer: Self-pay

## 2020-06-24 ENCOUNTER — Encounter: Payer: Self-pay | Admitting: Cardiology

## 2020-06-24 ENCOUNTER — Ambulatory Visit (INDEPENDENT_AMBULATORY_CARE_PROVIDER_SITE_OTHER): Payer: BC Managed Care – PPO | Admitting: Cardiology

## 2020-06-24 VITALS — BP 112/80 | HR 108 | Ht 69.0 in | Wt 171.6 lb

## 2020-06-24 DIAGNOSIS — E088 Diabetes mellitus due to underlying condition with unspecified complications: Secondary | ICD-10-CM

## 2020-06-24 DIAGNOSIS — T826XXD Infection and inflammatory reaction due to cardiac valve prosthesis, subsequent encounter: Secondary | ICD-10-CM

## 2020-06-24 DIAGNOSIS — E785 Hyperlipidemia, unspecified: Secondary | ICD-10-CM

## 2020-06-24 DIAGNOSIS — I38 Endocarditis, valve unspecified: Secondary | ICD-10-CM

## 2020-06-24 DIAGNOSIS — I2510 Atherosclerotic heart disease of native coronary artery without angina pectoris: Secondary | ICD-10-CM | POA: Diagnosis not present

## 2020-06-24 DIAGNOSIS — Z952 Presence of prosthetic heart valve: Secondary | ICD-10-CM

## 2020-06-24 DIAGNOSIS — I10 Essential (primary) hypertension: Secondary | ICD-10-CM | POA: Diagnosis not present

## 2020-06-24 DIAGNOSIS — I480 Paroxysmal atrial fibrillation: Secondary | ICD-10-CM | POA: Diagnosis not present

## 2020-06-24 MED ORDER — METOPROLOL TARTRATE 25 MG PO TABS: 25.0000 mg | ORAL_TABLET | Freq: Two times a day (BID) | ORAL | 3 refills | Status: DC

## 2020-06-24 NOTE — Patient Instructions (Signed)
Medication Instructions:  Your physician has recommended you make the following change in your medication:   Stop taking furosemide. Increase Metoprolol to 25 mg twice daily,  *If you need a refill on your cardiac medications before your next appointment, please call your pharmacy*   Lab Work: We done a TSH level today in the office. If you have labs (blood work) drawn today and your tests are completely normal, you will receive your results only by: Marland Kitchen MyChart Message (if you have MyChart) OR . A paper copy in the mail If you have any lab test that is abnormal or we need to change your treatment, we will call you to review the results.   Testing/Procedures: None ordered   Follow-Up: At [facility], you and your health needs are our priority.  As part of our continuing mission to provide you with exceptional heart care, we have created designated Provider Care Teams.  These Care Teams include your primary Cardiologist (physician) and Advanced Practice Providers (APPs -  Physician Assistants and Nurse Practitioners) who all work together to provide you with the care you need, when you need it.  We recommend signing up for the patient portal called "MyChart".  Sign up information is provided on this After Visit Summary.  MyChart is used to connect with patients for Virtual Visits (Telemedicine).  Patients are able to view lab/test results, encounter notes, upcoming appointments, etc.  Non-urgent messages can be sent to your provider as well.   To learn more about what you can do with MyChart, go to [email or web address].    Your next appointment:   1 month(s)  The format for your next appointment:   In Person  Provider:   Jyl Heinz, MD   Other Instructions NA

## 2020-06-24 NOTE — Progress Notes (Signed)
Cardiology Office Note:    Date:  06/24/2020   ID:  Johnathan Murray, DOB February 04, 1951, MRN [id number]  PCP:  Nicoletta Dress, MD  Cardiologist:  Jenean Lindau, MD   Referring MD: Nicoletta Dress, MD    ASSESSMENT:    1. Coronary artery disease involving native coronary artery of native heart without angina pectoris   2. Essential hypertension   3. PAF (paroxysmal atrial fibrillation) ([place])   4. Prosthetic valve endocarditis, subsequent encounter   5. S/P AVR   6. Dyslipidemia   7. Diabetes mellitus due to underlying condition with unspecified complications ([place])    PLAN:    In order of problems listed above:  1. EKG reveals sinus rhythm and nonspecific ST-T changes 2. Coronary artery disease: Secondary prevention stressed.  Patient is now getting rehab therapy regularly. 3. Paroxysmal atrial fibrillation:I discussed with the patient atrial fibrillation, disease process. Management and therapy including rate and rhythm control, anticoagulation benefits and potential risks were discussed extensively with the patient. Patient had multiple questions which were answered to patient's satisfaction. 4. I reviewed recent blood work including CBC, Chem-7.  In view of tachycardia have stopped his furosemide, doubled up his metoprolol.  He will keep a BP log and heart rate log and send it to me in a week.  We will also do a TSH checked today. 5. Extensive record review was done of his recent hospitalizations and questions were answered to his satisfaction.Patient will be seen in follow-up appointment in 6 months or earlier if the patient has any concerns    Medication Adjustments/Labs and Tests Ordered: Current medicines are reviewed at length with the patient today.  Concerns regarding medicines are outlined above.  No orders of the defined types were placed in this encounter.  No orders of the defined types were placed in this encounter.    No chief complaint on file.     History of Present Illness:    Johnathan Murray is a 69 y.o. male.  Patient has past medical history of coronary artery disease and aortic valve replacement.  He has history of essential hypertension.  He had endocarditis and infection of the aortic valve and underwent replacement.  Also had osteomyelitis of the knee and underwent surgery on his knee and also discitis of the lumbar intervertebral space.  He has had paroxysmal atrial fibrillation during hospital stay.  He appears to have recovered better and I am surprised to see him as good as it looks in spite of all his significant issues.  I also reviewed recent blood work and hospital stay records.  Past Medical History:  Diagnosis Date  . Abnormal LFTs (liver function tests) 10/31/2016  . Abnormal nuclear stress test 05/03/2018  . Acid reflux 02/15/2018   not at present  . Acute otitis externa of left ear 11/13/2019  . Acute suppurative otitis media of left ear without spontaneous rupture of tympanic membrane 11/13/2019  . Anemia    low iron  . Aortic stenosis 05/03/2018  . Aortic valve abscess 04/13/2020  . ARF (acute renal failure) ([place]) 03/20/2020  . Arthritis 02/15/2018   Overview:  shoulder  . Bacteremia due to Enterococcus 03/21/2020  . Cervical myelopathy ([place]) 04/22/2019  . Conductive hearing loss of left ear 11/13/2019  . Coronary artery disease involving native coronary artery of native heart without angina pectoris 05/30/2016  . Coronary artery disease of native artery of native heart with stable angina pectoris ([place])    pt. denies  .  Diabetes mellitus due to underlying condition with unspecified complications ([place]) 9/75/8832  . Discitis 04/07/2020  . Dyslipidemia 05/30/2016  . Dyspnea    upon exertion,  as of 06/05/09 not having any recent chest pain  . Dysrhythmia    a fib  . Endocarditis 04/13/2020  . Essential hypertension 05/30/2016  . Heart murmur    Has had an Aortic valve replacement  . History of fusion of cervical  spine 03/23/2020  . Hyperactive gag reflex 02/15/2018  . Hypothyroidism   . Intermittent claudication ([place]) 10/31/2018  . Lumbar stenosis with neurogenic claudication 06/11/2019  . Macular degeneration 02/15/2018  . PAF (paroxysmal atrial fibrillation) ([place]) 05/30/2016  . Pre-operative cardiovascular examination 01/03/2020  . Preoperative cardiovascular examination 04/17/2019  . Prosthetic valve endocarditis ([place]) 04/13/2020  . Protein-calorie malnutrition, severe 03/24/2020  . Pseudogout of left knee 03/21/2020  . Renal insufficiency 01/17/2020  . S/P aortic valve replacement 01/17/2020  . S/P AVR 01/17/2020  . Septic arthritis ([place]) 03/23/2020  . Tinnitus of left ear 11/13/2019  . Typical atrial flutter [facility])     Past Surgical History:  Procedure Laterality Date  . ANTERIOR CERVICAL DECOMP/DISCECTOMY FUSION N/A 04/22/2019   Procedure: Anterior Cervical Discectomy and Fusion, Cervical four-five, Cervical five-six;  Surgeon: Earnie Larsson, MD;  Location: [place];  Service: Neurosurgery;  Laterality: N/A;  . ANTERIOR CERVICAL DECOMP/DISCECTOMY FUSION N/A 03/02/2020   Procedure: Anterior Cervical Decompression Fusion  - Cervical three-Cervical four;  Surgeon: Earnie Larsson, MD;  Location: [place];  Service: Neurosurgery;  Laterality: N/A;  . AORTIC VALVE REPLACEMENT    . ASCENDING AORTIC ROOT REPLACEMENT N/A 04/16/2020   Procedure: ASCENDING AORTIC ROOT REPLACEMENT - HOMOGRAFT using [place] 23 MM Cryo Aortic Valve.;  Surgeon: Gaye Pollack, MD;  Location: [facility] OR;  Service: Open Heart Surgery;  Laterality: N/A;  . CARDIAC CATHETERIZATION     several stents  . CARDIAC SURGERY    . CARDIOVERSION N/A 04/13/2020   Procedure: CARDIOVERSION;  Surgeon: Geralynn Rile, MD;  Location: [place];  Service: Cardiovascular;  Laterality: N/A;  . CARDIOVERSION N/A 04/29/2020   Procedure: CARDIOVERSION;  Surgeon: Skeet Latch, MD;  Location: [facility] ENDOSCOPY;  Service: Cardiovascular;  Laterality: N/A;  .  CORONARY ARTERY BYPASS GRAFT  2017   patient unsure how many, had at [facility]  . HAND SURGERY    . I & D EXTREMITY Bilateral 05/02/2020   Procedure: IRRIGATION AND DEBRIDEMENT BILATERAL KNEES;  Surgeon: Newt Minion, MD;  Location: [place];  Service: Orthopedics;  Laterality: Bilateral;  . IR LUMBAR DISC ASPIRATION W/IMG GUIDE  04/07/2020  . IRRIGATION AND DEBRIDEMENT KNEE Bilateral 03/24/2020   Procedure: ARTHROSCOPY IRRIGATION AND DEBRIDEMENT BILATERAL  KNEE;  Surgeon: Marchia Bond, MD;  Location: [place];  Service: Orthopedics;  Laterality: Bilateral;  . KNEE SURGERY    . LEFT HEART CATH AND CORONARY ANGIOGRAPHY N/A 01/21/2020   Procedure: LEFT HEART CATH AND CORONARY ANGIOGRAPHY;  Surgeon: Belva Crome, MD;  Location: [place] CV LAB;  Service: Cardiovascular;  Laterality: N/A;  . LEFT HEART CATH AND CORS/GRAFTS ANGIOGRAPHY N/A 05/16/2018   Procedure: LEFT HEART CATH AND CORS/GRAFTS ANGIOGRAPHY;  Surgeon: Burnell Blanks, MD;  Location: [place] CV LAB;  Service: Cardiovascular;  Laterality: N/A;  . LUMBAR LAMINECTOMY/DECOMPRESSION MICRODISCECTOMY Bilateral 06/11/2019   Procedure: Bilateral Lumbar Four-FiveLaminectomy/Foraminotomy;  Surgeon: Earnie Larsson, MD;  Location: [place];  Service: Neurosurgery;  Laterality: Bilateral;  posterior  . SYNOVECTOMY Bilateral 05/02/2020   Procedure: SYNOVECTOMY;  Surgeon: Newt Minion, MD;  Location: [place];  Service: Orthopedics;  Laterality: Bilateral;  . TEE WITHOUT CARDIOVERSION N/A 03/26/2020   Procedure: TRANSESOPHAGEAL ECHOCARDIOGRAM (TEE);  Surgeon: Jerline Pain, MD;  Location: [facility] ENDOSCOPY;  Service: Cardiovascular;  Laterality: N/A;  . TEE WITHOUT CARDIOVERSION N/A 04/13/2020   Procedure: TRANSESOPHAGEAL ECHOCARDIOGRAM (TEE);  Surgeon: Geralynn Rile, MD;  Location: [place];  Service: Cardiovascular;  Laterality: N/A;  . TEE WITHOUT CARDIOVERSION N/A 04/16/2020   Procedure: TRANSESOPHAGEAL ECHOCARDIOGRAM (TEE);   Surgeon: Gaye Pollack, MD;  Location: [place];  Service: Open Heart Surgery;  Laterality: N/A;  . TEE WITHOUT CARDIOVERSION N/A 04/29/2020   Procedure: TRANSESOPHAGEAL ECHOCARDIOGRAM (TEE);  Surgeon: Skeet Latch, MD;  Location: [facility] ENDOSCOPY;  Service: Cardiovascular;  Laterality: N/A;    Current Medications: Current Meds  Medication Sig  . amiodarone (PACERONE) 200 MG tablet Take 1 tablet (200 mg total) by mouth daily.  Marland Kitchen amoxicillin (AMOXIL) 500 MG capsule Take 1 capsule (500 mg total) by mouth 2 (two) times daily.  Marland Kitchen apixaban (ELIQUIS) 5 MG TABS tablet Take 1 tablet (5 mg total) by mouth 2 (two) times daily.  . ASPERCREME LIDOCAINE 4 % PTCH Apply topically.  . blood glucose meter kit and supplies Dispense based on patient and insurance preference. Use up to four times daily as directed. (FOR ICD-10 E10.9, E11.9).  . cyclobenzaprine (FLEXERIL) 5 MG tablet Take 1 tablet (5 mg total) by mouth 3 (three) times daily as needed for muscle spasms.  . Empagliflozin (JARDIANCE PO) Take 1 tablet by mouth daily. Unsure of dose  . ezetimibe (ZETIA) 10 MG tablet Take 1 tablet (10 mg total) by mouth at bedtime.  . ferrous sulfate 325 (65 FE) MG tablet Take 1 tablet (325 mg total) by mouth 2 (two) times daily with a meal.  . furosemide (LASIX) 20 MG tablet Take 3 tablets (60 mg total) by mouth daily.  Marland Kitchen levothyroxine (SYNTHROID) 25 MCG tablet Take 1 tablet (25 mcg total) by mouth daily before breakfast.  . lidocaine (LIDODERM) 5 % Place 2 patches onto the skin daily. Remove & Discard patch within 12 hours or as directed by MD  . metoprolol tartrate (LOPRESSOR) 25 MG tablet Take 0.5 tablets (12.5 mg total) by mouth 2 (two) times daily.  Marland Kitchen oxyCODONE (OXY IR/ROXICODONE) 5 MG immediate release tablet Take 1 tablet (5 mg total) by mouth every 4 (four) hours as needed for severe pain.  Marland Kitchen oxyCODONE (OXYCONTIN) 10 mg 12 hr tablet Take 1 tablet (10 mg total) by mouth every 12 (twelve) hours.  . polyethylene  glycol (MIRALAX / GLYCOLAX) 17 g packet Take 17 g by mouth daily.  . potassium chloride SA (KLOR-CON) 20 MEQ tablet Take 1 tablet (20 mEq total) by mouth 2 (two) times daily.  . pregabalin (LYRICA) 100 MG capsule Take 1 capsule (100 mg total) by mouth 3 (three) times daily.  Marland Kitchen senna (SENOKOT) 8.6 MG TABS tablet Take 1 tablet (8.6 mg total) by mouth at bedtime.  . vitamin B-12 (CYANOCOBALAMIN) 1000 MCG tablet Take 1,000 mcg by mouth daily.  . vitamin C (ASCORBIC ACID) 500 MG tablet Take 500 mg by mouth daily.   Marland Kitchen zinc gluconate 50 MG tablet Take 50 mg by mouth daily.     Allergies:   Atorvastatin and Nebivolol   Social History   Socioeconomic History  . Marital status: Married    Spouse name: [name]  . Number of children: 1  . Years of education: Not on  file  . Highest education level: GED or equivalent  Occupational History    Employer: [employer]  Tobacco Use  . Smoking status: Never Smoker  . Smokeless tobacco: Never Used  Vaping Use  . Vaping Use: Never used  Substance and Sexual Activity  . Alcohol use: Not Currently  . Drug use: Never  . Sexual activity: Not Currently  Other Topics Concern  . Not on file  Social History Narrative   He drives a truck for [employer].   He lives with wife in a one-level home.   Highest level of education:  GED      Patient is right-handed. There are 8 steps to enter his home.   Social Determinants of Health   Financial Resource Strain:   . Difficulty of Paying Living Expenses:   Food Insecurity:   . Worried About Charity fundraiser in the Last Year:   . Arboriculturist in the Last Year:   Transportation Needs:   . Film/video editor (Medical):   Marland Kitchen Lack of Transportation (Non-Medical):   Physical Activity:   . Days of Exercise per Week:   . Minutes of Exercise per Session:   Stress:   . Feeling of Stress :   Social Connections:   . Frequency of Communication with Friends and Family:   . Frequency of  Social Gatherings with Friends and Family:   . Attends Religious Services:   . Active Member of Clubs or Organizations:   . Attends Archivist Meetings:   Marland Kitchen Marital Status:      Family History: The patient's family history includes Brain cancer in his mother; Heart disease in his maternal grandmother; Prostate cancer in his brother.  ROS:   Please see the history of present illness.    All other systems reviewed and are negative.  EKGs/Labs/Other Studies Reviewed:    The following studies were reviewed today: IMPRESSIONS    1. Left ventricular ejection fraction, by estimation, is 55 to 60%. The  left ventricle has normal function. The left ventricle has no regional  wall motion abnormalities.  2. Right ventricular systolic function is mildly reduced. The right  ventricular size is normal.  3. No left atrial/left atrial appendage thrombus was detected. The LAA  emptying velocity was 50 cm/s.  4. Multiple regurgitant jets each with mild regurgitation. The mitral  valve is normal in structure. Mild to moderate mitral valve regurgitation.  No evidence of mitral stenosis.  5. Tricuspid valve regurgitation is mild to moderate.  6. The aortic valve has been repaired/replaced. Aortic valve  regurgitation is trivial. No aortic stenosis is present. There is a 23 mm  Homograft valve/aortic root valve present in the aortic position.  Procedure Date: 04/16/2020. Echo findings are  consistent with normal structure and function of the aortic valve  prosthesis.  7. The inferior vena cava is normal in size with greater than 50%  respiratory variability, suggesting right atrial pressure of 3 mmHg.   Conclusion(s)/Recommendation(s): Normal biventricular function without  evidence of hemodynamically significant valvular heart disease.    Recent Labs: 05/14/2020: Magnesium 2.2 05/18/2020: B Natriuretic Peptide 134.7 05/19/2020: TSH 7.363 05/20/2020: ALT 37 06/17/2020: BUN 32;  Creat 1.59; Hemoglobin 11.4; Platelets 264; Potassium 4.5; Sodium 140  Recent Lipid Panel    Component Value Date/Time   CHOL 217 (H) 05/21/2018 1130   TRIG 61 05/21/2018 1130   HDL 100 05/21/2018 1130   CHOLHDL 2.2 05/21/2018 1130   LDLCALC 105 (  H) 05/21/2018 1130    Physical Exam:    VS:  BP 112/80   Pulse (!) 108   Ht _0  (1.753 m)   Wt 171 lb 9.6 oz (77.8 kg)   SpO2 96%   BMI 25.34 kg/m     Wt Readings from Last 3 Encounters:  06/24/20 171 lb 9.6 oz (77.8 kg)  06/17/20 178 lb (80.7 kg)  06/17/20 178 lb (80.7 kg)     GEN: Patient is in no acute distress HEENT: Normal NECK: No JVD; No carotid bruits LYMPHATICS: No lymphadenopathy CARDIAC: Hear sounds regular, 2/6 systolic murmur at the apex. RESPIRATORY:  Clear to auscultation without rales, wheezing or rhonchi  ABDOMEN: Soft, non-tender, non-distended MUSCULOSKELETAL:  No edema; No deformity  SKIN: Warm and dry NEUROLOGIC:  Alert and oriented x 3 PSYCHIATRIC:  Normal affect   Signed, Jenean Lindau, MD  06/24/2020 3:06 PM    [place]

## 2020-06-25 DIAGNOSIS — M6281 Muscle weakness (generalized): Secondary | ICD-10-CM | POA: Diagnosis not present

## 2020-06-25 DIAGNOSIS — R2689 Other abnormalities of gait and mobility: Secondary | ICD-10-CM | POA: Diagnosis not present

## 2020-06-25 LAB — TSH: TSH: 6.72 u[IU]/mL — ABNORMAL HIGH (ref 0.450–4.500)

## 2020-06-29 DIAGNOSIS — R2689 Other abnormalities of gait and mobility: Secondary | ICD-10-CM | POA: Diagnosis not present

## 2020-06-29 DIAGNOSIS — M6281 Muscle weakness (generalized): Secondary | ICD-10-CM | POA: Diagnosis not present

## 2020-07-01 DIAGNOSIS — R2689 Other abnormalities of gait and mobility: Secondary | ICD-10-CM | POA: Diagnosis not present

## 2020-07-01 DIAGNOSIS — M6281 Muscle weakness (generalized): Secondary | ICD-10-CM | POA: Diagnosis not present

## 2020-07-02 DIAGNOSIS — M4620 Osteomyelitis of vertebra, site unspecified: Secondary | ICD-10-CM

## 2020-07-02 HISTORY — DX: Osteomyelitis of vertebra, site unspecified: M46.20

## 2020-07-06 DIAGNOSIS — M6281 Muscle weakness (generalized): Secondary | ICD-10-CM | POA: Diagnosis not present

## 2020-07-06 DIAGNOSIS — R2689 Other abnormalities of gait and mobility: Secondary | ICD-10-CM | POA: Diagnosis not present

## 2020-07-08 DIAGNOSIS — M6281 Muscle weakness (generalized): Secondary | ICD-10-CM | POA: Diagnosis not present

## 2020-07-08 DIAGNOSIS — R2689 Other abnormalities of gait and mobility: Secondary | ICD-10-CM | POA: Diagnosis not present

## 2020-07-13 ENCOUNTER — Encounter: Payer: Self-pay | Admitting: Orthopedic Surgery

## 2020-07-13 ENCOUNTER — Ambulatory Visit (INDEPENDENT_AMBULATORY_CARE_PROVIDER_SITE_OTHER): Payer: BC Managed Care – PPO | Admitting: Orthopedic Surgery

## 2020-07-13 DIAGNOSIS — M009 Pyogenic arthritis, unspecified: Secondary | ICD-10-CM

## 2020-07-13 NOTE — Progress Notes (Signed)
Office Visit Note   Patient: Johnathan Murray           Date of Birth: 04/22/1951           MRN: [id number] Visit Date: 07/13/2020              Requested by: Nicoletta Dress, MD [place],  [place] [address] PCP: Nicoletta Dress, MD  No chief complaint on file.     HPI: Patient is a 69 year old gentleman who presents 2-1/2 months status post synovectomy septic bilateral knees with history of gout as well.  Patient is still on antibiotics for osteomyelitis of the lumbar spine he is currently ambulating with a walker using compression sleeves on both knees he states that his legs are very weak and he is fallen several times.  Assessment & Plan: Visit Diagnoses:  1. Septic arthritis of knee, bilateral ([place])     Plan: Recommend patient continue with his physical therapy and once that is complete join a gym for leg strengthening.  Discussed with his knees I would recommend against total knee replacement in the future due to the septic knees that were treated.  Recommended proper diet for gout.  Follow-Up Instructions: Return if symptoms worsen or fail to improve.   Ortho Exam  Patient is alert, oriented, no adenopathy, well-dressed, normal affect, normal respiratory effort. Examination of both knees there is no effusion no redness the surgical incision is well-healed he has full range of motion of both knees.  Patient has significant quad atrophy.  There is no pain with range of motion of his knees.  Imaging: No results found. No images are attached to the encounter.  Labs: Lab Results  Component Value Date   HGBA1C 6.8 (H) 02/24/2020   HGBA1C 6.0 (H) 04/18/2019   ESRSEDRATE 19 06/17/2020   ESRSEDRATE 30 (H) 05/05/2020   ESRSEDRATE 118 (H) 04/05/2020   CRP 40.8 (H) 06/17/2020   CRP 11.4 (H) 05/05/2020   CRP 12.4 (H) 04/05/2020   REPTSTATUS 05/07/2020 FINAL 05/02/2020   GRAMSTAIN  05/02/2020    RARE WBC PRESENT,BOTH PMN AND  MONONUCLEAR NO ORGANISMS SEEN    CULT  05/02/2020    No growth aerobically or anaerobically. Performed at [facility], [place] [address] [place], [place] [address]    LABORGA ENTEROCOCCUS FAECALIS 04/07/2020     Lab Results  Component Value Date   ALBUMIN 2.4 (L) 05/20/2020   ALBUMIN 2.3 (L) 05/19/2020   ALBUMIN 2.0 (L) 05/18/2020    Lab Results  Component Value Date   MG 2.2 05/14/2020   MG 2.1 05/13/2020   MG 2.1 05/12/2020   No results found for: VD25OH  No results found for: PREALBUMIN CBC EXTENDED Latest Ref Rng & Units 06/17/2020 06/04/2020 06/03/2020  WBC 3.8 - 10.8 Thousand/uL 6.2 5.7 6.7  RBC 4.20 - 5.80 Million/uL 4.05(L) 3.33(L) 3.45(L)  HGB 13.2 - 17.1 g/dL 11.4(L) 9.1(L) 9.4(L)  HCT 38 - 50 % 35.8(L) 30.6(L) 31.7(L)  PLT 140 - 400 Thousand/uL 264 269 289  NEUTROABS 1.7 - 7.7 K/uL - 3.7 4.6  LYMPHSABS 0.7 - 4.0 K/uL - 1.2 1.2     There is no height or weight on file to calculate BMI.  Orders:  No orders of the defined types were placed in this encounter.  No orders of the defined types were placed in this encounter.    Procedures: No procedures performed  Clinical Data: No additional findings.  ROS:  All other systems negative, except as noted in the HPI. Review of Systems  Objective: Vital Signs: There were no vitals taken for this visit.  Specialty Comments:  No specialty comments available.  PMFS History: Patient Active Problem List   Diagnosis Date Noted  . Drug-induced hypotension   . Hypotension due to drugs   . Hypokalemia   . Hypotension due to hypovolemia   . Anasarca   . History of prolonged Q-T interval on ECG   . Peripheral edema   . Labile blood glucose   . Atrial fibrillation with RVR ([place])   . Postoperative pain   . Transaminitis   . Hypoalbuminemia due to protein-calorie malnutrition ([place])   . Drug induced constipation   . Controlled type 2 diabetes mellitus with hyperglycemia, with long-term current use of  insulin ([place])   . AKI (acute kidney injury) ([place])   . Pressure injury of skin 05/14/2020  . Debility 05/14/2020  . Lumbar discitis 05/04/2020  . Acute blood loss anemia 05/02/2020  . Prolonged QT interval 05/02/2020  . Transient hypotension 05/02/2020  . Persistent atrial fibrillation ([place])   . Prosthetic valve endocarditis ([place]) 04/13/2020  . Aortic valve abscess 04/13/2020  . Endocarditis 04/13/2020  . Typical atrial flutter ([place])   . Discitis 04/07/2020  . Moderate protein-calorie malnutrition ([place]) 03/24/2020  . Septic arthritis of knee, bilateral ([place]) 03/23/2020  . History of fusion of cervical spine 03/23/2020  . Bacteremia due to Enterococcus 03/21/2020  . Pseudogout of left knee 03/21/2020  . ARF (acute renal failure) ([place]) 03/20/2020  . S/P AVR 01/17/2020  . Renal insufficiency 01/17/2020  . Pre-operative cardiovascular examination 01/03/2020  . Acute otitis externa of left ear 11/13/2019  . Acute suppurative otitis media of left ear without spontaneous rupture of tympanic membrane 11/13/2019  . Conductive hearing loss of left ear 11/13/2019  . Tinnitus of left ear 11/13/2019  . Lumbar stenosis with neurogenic claudication 06/11/2019  . Cervical myelopathy ([place]) 04/22/2019  . Preoperative cardiovascular examination 04/17/2019  . Intermittent claudication ([place]) 10/31/2018  . Coronary artery disease of native artery of native heart with stable angina pectoris ([place])   . Abnormal nuclear stress test 05/03/2018  . Aortic stenosis 05/03/2018  . Acid reflux 02/15/2018  . Arthritis 02/15/2018  . Hyperactive gag reflex 02/15/2018  . Macular degeneration 02/15/2018  . Abnormal LFTs (liver function tests) 10/31/2016  . Coronary artery disease involving native coronary artery of native heart without angina pectoris 05/30/2016  . Diabetes mellitus due to underlying condition with unspecified complications ([place]) 62/83/1517  . Dyslipidemia 05/30/2016  . Essential hypertension  05/30/2016  . PAF (paroxysmal atrial fibrillation) ([place]) 05/30/2016   Past Medical History:  Diagnosis Date  . Abnormal LFTs (liver function tests) 10/31/2016  . Abnormal nuclear stress test 05/03/2018  . Acid reflux 02/15/2018   not at present  . Acute otitis externa of left ear 11/13/2019  . Acute suppurative otitis media of left ear without spontaneous rupture of tympanic membrane 11/13/2019  . Anemia    low iron  . Aortic stenosis 05/03/2018  . Aortic valve abscess 04/13/2020  . ARF (acute renal failure) ([place]) 03/20/2020  . Arthritis 02/15/2018   Overview:  shoulder  . Bacteremia due to Enterococcus 03/21/2020  . Cervical myelopathy ([place]) 04/22/2019  . Conductive hearing loss of left ear 11/13/2019  . Coronary artery disease involving native coronary artery of native heart without angina pectoris 05/30/2016  . Coronary artery disease of native artery of native heart with stable angina  pectoris ([place])    pt. denies  . Diabetes mellitus due to underlying condition with unspecified complications ([place]) 5/46/5681  . Discitis 04/07/2020  . Dyslipidemia 05/30/2016  . Dyspnea    upon exertion,  as of 06/05/09 not having any recent chest pain  . Dysrhythmia    a fib  . Endocarditis 04/13/2020  . Essential hypertension 05/30/2016  . Heart murmur    Has had an Aortic valve replacement  . History of fusion of cervical spine 03/23/2020  . Hyperactive gag reflex 02/15/2018  . Hypothyroidism   . Intermittent claudication ([place]) 10/31/2018  . Lumbar stenosis with neurogenic claudication 06/11/2019  . Macular degeneration 02/15/2018  . PAF (paroxysmal atrial fibrillation) ([place]) 05/30/2016  . Pre-operative cardiovascular examination 01/03/2020  . Preoperative cardiovascular examination 04/17/2019  . Prosthetic valve endocarditis ([place]) 04/13/2020  . Protein-calorie malnutrition, severe 03/24/2020  . Pseudogout of left knee 03/21/2020  . Renal insufficiency 01/17/2020  . S/P aortic valve replacement 01/17/2020  .  S/P AVR 01/17/2020  . Septic arthritis ([place]) 03/23/2020  . Tinnitus of left ear 11/13/2019  . Typical atrial flutter ([facility])     Family History  Problem Relation Age of Onset  . Prostate cancer Brother   . Heart disease Maternal Grandmother   . Brain cancer Mother     Past Surgical History:  Procedure Laterality Date  . ANTERIOR CERVICAL DECOMP/DISCECTOMY FUSION N/A 04/22/2019   Procedure: Anterior Cervical Discectomy and Fusion, Cervical four-five, Cervical five-six;  Surgeon: Earnie Larsson, MD;  Location: [place];  Service: Neurosurgery;  Laterality: N/A;  . ANTERIOR CERVICAL DECOMP/DISCECTOMY FUSION N/A 03/02/2020   Procedure: Anterior Cervical Decompression Fusion  - Cervical three-Cervical four;  Surgeon: Earnie Larsson, MD;  Location: [facility];  Service: Neurosurgery;  Laterality: N/A;  . AORTIC VALVE REPLACEMENT    . ASCENDING AORTIC ROOT REPLACEMENT N/A 04/16/2020   Procedure: ASCENDING AORTIC ROOT REPLACEMENT - HOMOGRAFT using Osprey 23 MM Cryo Aortic Valve.;  Surgeon: Gaye Pollack, MD;  Location: [facility];  Service: Open Heart Surgery;  Laterality: N/A;  . CARDIAC CATHETERIZATION     several stents  . CARDIAC SURGERY    . CARDIOVERSION N/A 04/13/2020   Procedure: CARDIOVERSION;  Surgeon: Geralynn Rile, MD;  Location: [place];  Service: Cardiovascular;  Laterality: N/A;  . CARDIOVERSION N/A 04/29/2020   Procedure: CARDIOVERSION;  Surgeon: Skeet Latch, MD;  Location: [facility] ENDOSCOPY;  Service: Cardiovascular;  Laterality: N/A;  . CORONARY ARTERY BYPASS GRAFT  2017   patient unsure how many, had at [facility]  . HAND SURGERY    . I & D EXTREMITY Bilateral 05/02/2020   Procedure: IRRIGATION AND DEBRIDEMENT BILATERAL KNEES;  Surgeon: Newt Minion, MD;  Location: [place];  Service: Orthopedics;  Laterality: Bilateral;  . IR LUMBAR DISC ASPIRATION W/IMG GUIDE  04/07/2020  . IRRIGATION AND DEBRIDEMENT KNEE Bilateral 03/24/2020   Procedure: ARTHROSCOPY IRRIGATION AND  DEBRIDEMENT BILATERAL  KNEE;  Surgeon: Marchia Bond, MD;  Location: [facility];  Service: Orthopedics;  Laterality: Bilateral;  . KNEE SURGERY    . LEFT HEART CATH AND CORONARY ANGIOGRAPHY N/A 01/21/2020   Procedure: LEFT HEART CATH AND CORONARY ANGIOGRAPHY;  Surgeon: Belva Crome, MD;  Location: [facility];  Service: Cardiovascular;  Laterality: N/A;  . LEFT HEART CATH AND CORS/GRAFTS ANGIOGRAPHY N/A 05/16/2018   Procedure: LEFT HEART CATH AND CORS/GRAFTS ANGIOGRAPHY;  Surgeon: Burnell Blanks, MD;  Location: [place] [facility];  Service: Cardiovascular;  Laterality: N/A;  .  LUMBAR LAMINECTOMY/DECOMPRESSION MICRODISCECTOMY Bilateral 06/11/2019   Procedure: Bilateral Lumbar Four-FiveLaminectomy/Foraminotomy;  Surgeon: Earnie Larsson, MD;  Location: [facility];  Service: Neurosurgery;  Laterality: Bilateral;  posterior  . SYNOVECTOMY Bilateral 05/02/2020   Procedure: SYNOVECTOMY;  Surgeon: Newt Minion, MD;  Location: [place];  Service: Orthopedics;  Laterality: Bilateral;  . TEE WITHOUT CARDIOVERSION N/A 03/26/2020   Procedure: TRANSESOPHAGEAL ECHOCARDIOGRAM (TEE);  Surgeon: Jerline Pain, MD;  Location: [facility] ENDOSCOPY;  Service: Cardiovascular;  Laterality: N/A;  . TEE WITHOUT CARDIOVERSION N/A 04/13/2020   Procedure: TRANSESOPHAGEAL ECHOCARDIOGRAM (TEE);  Surgeon: Geralynn Rile, MD;  Location: [place];  Service: Cardiovascular;  Laterality: N/A;  . TEE WITHOUT CARDIOVERSION N/A 04/16/2020   Procedure: TRANSESOPHAGEAL ECHOCARDIOGRAM (TEE);  Surgeon: Gaye Pollack, MD;  Location: [place];  Service: Open Heart Surgery;  Laterality: N/A;  . TEE WITHOUT CARDIOVERSION N/A 04/29/2020   Procedure: TRANSESOPHAGEAL ECHOCARDIOGRAM (TEE);  Surgeon: Skeet Latch, MD;  Location: [facility] ENDOSCOPY;  Service: Cardiovascular;  Laterality: N/A;   Social History   Occupational History    Employer: [employer]  Tobacco Use  . Smoking status: Never Smoker  . Smokeless tobacco: Never Used   Vaping Use  . Vaping Use: Never used  Substance and Sexual Activity  . Alcohol use: Not Currently  . Drug use: Never  . Sexual activity: Not Currently

## 2020-07-15 DIAGNOSIS — M6281 Muscle weakness (generalized): Secondary | ICD-10-CM | POA: Diagnosis not present

## 2020-07-15 DIAGNOSIS — R2689 Other abnormalities of gait and mobility: Secondary | ICD-10-CM | POA: Diagnosis not present

## 2020-07-17 DIAGNOSIS — R2689 Other abnormalities of gait and mobility: Secondary | ICD-10-CM | POA: Diagnosis not present

## 2020-07-17 DIAGNOSIS — M6281 Muscle weakness (generalized): Secondary | ICD-10-CM | POA: Diagnosis not present

## 2020-07-21 ENCOUNTER — Other Ambulatory Visit: Payer: Self-pay

## 2020-07-21 MED ORDER — AMIODARONE HCL 200 MG PO TABS: 100.0000 mg | ORAL_TABLET | Freq: Every day | ORAL | 1 refills | Status: DC

## 2020-07-21 MED ORDER — METOPROLOL TARTRATE 25 MG PO TABS: 12.5000 mg | ORAL_TABLET | Freq: Two times a day (BID) | ORAL | 3 refills | Status: DC

## 2020-07-21 NOTE — Telephone Encounter (Signed)
Ho do you advise?

## 2020-07-22 DIAGNOSIS — R2689 Other abnormalities of gait and mobility: Secondary | ICD-10-CM | POA: Diagnosis not present

## 2020-07-22 DIAGNOSIS — M6281 Muscle weakness (generalized): Secondary | ICD-10-CM | POA: Diagnosis not present

## 2020-07-28 ENCOUNTER — Telehealth: Payer: Self-pay

## 2020-07-28 ENCOUNTER — Encounter: Payer: Self-pay | Admitting: Cardiology

## 2020-07-28 ENCOUNTER — Ambulatory Visit (INDEPENDENT_AMBULATORY_CARE_PROVIDER_SITE_OTHER): Payer: BC Managed Care – PPO | Admitting: Cardiology

## 2020-07-28 ENCOUNTER — Other Ambulatory Visit: Payer: Self-pay

## 2020-07-28 ENCOUNTER — Other Ambulatory Visit: Payer: Self-pay | Admitting: Cardiology

## 2020-07-28 VITALS — BP 116/70 | HR 47 | Ht 70.0 in | Wt 176.0 lb

## 2020-07-28 DIAGNOSIS — Z952 Presence of prosthetic heart valve: Secondary | ICD-10-CM

## 2020-07-28 DIAGNOSIS — I480 Paroxysmal atrial fibrillation: Secondary | ICD-10-CM

## 2020-07-28 DIAGNOSIS — Z79899 Other long term (current) drug therapy: Secondary | ICD-10-CM

## 2020-07-28 DIAGNOSIS — M6281 Muscle weakness (generalized): Secondary | ICD-10-CM | POA: Diagnosis not present

## 2020-07-28 DIAGNOSIS — I10 Essential (primary) hypertension: Secondary | ICD-10-CM

## 2020-07-28 DIAGNOSIS — I25118 Atherosclerotic heart disease of native coronary artery with other forms of angina pectoris: Secondary | ICD-10-CM

## 2020-07-28 DIAGNOSIS — R2689 Other abnormalities of gait and mobility: Secondary | ICD-10-CM | POA: Diagnosis not present

## 2020-07-28 MED ORDER — METOPROLOL TARTRATE 25 MG PO TABS: 12.5000 mg | ORAL_TABLET | ORAL | 3 refills | Status: DC

## 2020-07-28 NOTE — Patient Instructions (Addendum)
Medication Instructions:  Decrease Metoprolol to 12.5 mg every morning   *If you need a refill on your cardiac medications before your next appointment, please call your pharmacy*   Lab Work: Bmp, Lft- Today   If you have labs (blood work) drawn today and your tests are completely normal, you will receive your results only by: Marland Kitchen MyChart Message (if you have MyChart) OR . A paper copy in the mail If you have any lab test that is abnormal or we need to change your treatment, we will call you to review the results.   Testing/Procedures: None ordered    Follow-Up: At [facility], you and your health needs are our priority.  As part of our continuing mission to provide you with exceptional heart care, we have created designated Provider Care Teams.  These Care Teams include your primary Cardiologist (physician) and Advanced Practice Providers (APPs -  Physician Assistants and Nurse Practitioners) who all work together to provide you with the care you need, when you need it.  We recommend signing up for the patient portal called "MyChart".  Sign up information is provided on this After Visit Summary.  MyChart is used to connect with patients for Virtual Visits (Telemedicine).  Patients are able to view lab/test results, encounter notes, upcoming appointments, etc.  Non-urgent messages can be sent to your provider as well.   To learn more about what you can do with MyChart, go to [email or web address].    Your next appointment:   1 month(s)  The format for your next appointment:   In Person  Provider:   Jyl Heinz, MD   Other Instructions None

## 2020-07-28 NOTE — Progress Notes (Signed)
Cardiology Office Note:    Date:  07/28/2020   ID:  [name], DOB 02/12/1951, MRN [id number]  PCP:  Nicoletta Dress, MD  Cardiologist:  Jenean Lindau, MD   Referring MD: Nicoletta Dress, MD    ASSESSMENT:    1. PAF (paroxysmal atrial fibrillation) ([place])   2. Essential hypertension   3. Coronary artery disease of native artery of native heart with stable angina pectoris ([place])   4. S/P AVR    PLAN:    In order of problems listed above:  1. Coronary artery disease: Secondary prevention stressed with the patient.  Importance of compliance with diet medication stressed and he vocalized understanding. 2. Atrial fibrillation:I discussed with the patient atrial fibrillation, disease process. Management and therapy including rate and rhythm control, anticoagulation benefits and potential risks were discussed extensively with the patient. Patient had multiple questions which were answered to patient's satisfaction.  Patient mentions to me that he does not want to cut down his amiodarone at this time because he feels sometimes he has fast heart rates.  I respect his wishes and we will keep it at 200 mg a day.  Risks of amiodarone explained and he vocalized understanding.  He will continue anticoagulation. 3. Bradycardia: Because of this I have cut down metoprolol to 12.5 mg only in the morning.  His blood pressure is also borderline and hopefully this will help his blood pressure.  Adequate hydration was emphasized. 4. Mixed dyslipidemia: Diet was emphasized.  Lipids followed by primary care physician. 5. Follow-up appointment in a month or earlier if he has any concerns.   Medication Adjustments/Labs and Tests Ordered: Current medicines are reviewed at length with the patient today.  Concerns regarding medicines are outlined above.  No orders of the defined types were placed in this encounter.  No orders of the defined types were placed in this encounter.    No chief  complaint on file.    History of Present Illness:    Johnathan Murray is a 69 y.o. male.  Patient has past medical history of coronary artery disease: Aortic valve replacement and subsequent infection.  The patient then developed knee joint infection.  Patient also has atrial fibrillation.  He denies any problems at this time and takes care of activities of daily living.  No chest pain orthopnea or PND.  At the time of my evaluation, the patient is alert awake oriented and in no distress.  Wife accompanies him for the visit.  Past Medical History:  Diagnosis Date  . Abnormal LFTs (liver function tests) 10/31/2016  . Abnormal nuclear stress test 05/03/2018  . Acid reflux 02/15/2018   not at present  . Acute otitis externa of left ear 11/13/2019  . Acute suppurative otitis media of left ear without spontaneous rupture of tympanic membrane 11/13/2019  . Anemia    low iron  . Aortic stenosis 05/03/2018  . Aortic valve abscess 04/13/2020  . ARF (acute renal failure) ([place]) 03/20/2020  . Arthritis 02/15/2018   Overview:  shoulder  . Bacteremia due to Enterococcus 03/21/2020  . Cervical myelopathy ([place]) 04/22/2019  . Conductive hearing loss of left ear 11/13/2019  . Coronary artery disease involving native coronary artery of native heart without angina pectoris 05/30/2016  . Coronary artery disease of native artery of native heart with stable angina pectoris ([place])    pt. denies  . Diabetes mellitus due to underlying condition with unspecified complications ([place]) 7/40/8144  . Discitis 04/07/2020  .  Dyslipidemia 05/30/2016  . Dyspnea    upon exertion,  as of 06/05/09 not having any recent chest pain  . Dysrhythmia    a fib  . Endocarditis 04/13/2020  . Essential hypertension 05/30/2016  . Heart murmur    Has had an Aortic valve replacement  . History of fusion of cervical spine 03/23/2020  . Hyperactive gag reflex 02/15/2018  . Hypothyroidism   . Intermittent claudication ([place]) 10/31/2018  . Lumbar  stenosis with neurogenic claudication 06/11/2019  . Macular degeneration 02/15/2018  . PAF (paroxysmal atrial fibrillation) ([place]) 05/30/2016  . Pre-operative cardiovascular examination 01/03/2020  . Preoperative cardiovascular examination 04/17/2019  . Prosthetic valve endocarditis ([place]) 04/13/2020  . Protein-calorie malnutrition, severe 03/24/2020  . Pseudogout of left knee 03/21/2020  . Renal insufficiency 01/17/2020  . S/P aortic valve replacement 01/17/2020  . S/P AVR 01/17/2020  . Septic arthritis ([place]) 03/23/2020  . Tinnitus of left ear 11/13/2019  . Typical atrial flutter [facility])     Past Surgical History:  Procedure Laterality Date  . ANTERIOR CERVICAL DECOMP/DISCECTOMY FUSION N/A 04/22/2019   Procedure: Anterior Cervical Discectomy and Fusion, Cervical four-five, Cervical five-six;  Surgeon: Earnie Larsson, MD;  Location: [place];  Service: Neurosurgery;  Laterality: N/A;  . ANTERIOR CERVICAL DECOMP/DISCECTOMY FUSION N/A 03/02/2020   Procedure: Anterior Cervical Decompression Fusion  - Cervical three-Cervical four;  Surgeon: Earnie Larsson, MD;  Location: [place];  Service: Neurosurgery;  Laterality: N/A;  . AORTIC VALVE REPLACEMENT    . ASCENDING AORTIC ROOT REPLACEMENT N/A 04/16/2020   Procedure: ASCENDING AORTIC ROOT REPLACEMENT - HOMOGRAFT using [place] 23 MM Cryo Aortic Valve.;  Surgeon: Gaye Pollack, MD;  Location: [facility] OR;  Service: Open Heart Surgery;  Laterality: N/A;  . CARDIAC CATHETERIZATION     several stents  . CARDIAC SURGERY    . CARDIOVERSION N/A 04/13/2020   Procedure: CARDIOVERSION;  Surgeon: Geralynn Rile, MD;  Location: [place];  Service: Cardiovascular;  Laterality: N/A;  . CARDIOVERSION N/A 04/29/2020   Procedure: CARDIOVERSION;  Surgeon: Skeet Latch, MD;  Location[facility] ENDOSCOPY;  Service: Cardiovascular;  Laterality: N/A;  . CORONARY ARTERY BYPASS GRAFT  2017   patient unsure how many, had at [facility]  . HAND SURGERY    . I & D EXTREMITY  Bilateral 05/02/2020   Procedure: IRRIGATION AND DEBRIDEMENT BILATERAL KNEES;  Surgeon: Newt Minion, MD;  Location: [place];  Service: Orthopedics;  Laterality: Bilateral;  . IR LUMBAR DISC ASPIRATION W/IMG GUIDE  04/07/2020  . IRRIGATION AND DEBRIDEMENT KNEE Bilateral 03/24/2020   Procedure: ARTHROSCOPY IRRIGATION AND DEBRIDEMENT BILATERAL  KNEE;  Surgeon: Marchia Bond, MD;  Location: [place];  Service: Orthopedics;  Laterality: Bilateral;  . KNEE SURGERY    . LEFT HEART CATH AND CORONARY ANGIOGRAPHY N/A 01/21/2020   Procedure: LEFT HEART CATH AND CORONARY ANGIOGRAPHY;  Surgeon: Belva Crome, MD;  Location: [place] CV LAB;  Service: Cardiovascular;  Laterality: N/A;  . LEFT HEART CATH AND CORS/GRAFTS ANGIOGRAPHY N/A 05/16/2018   Procedure: LEFT HEART CATH AND CORS/GRAFTS ANGIOGRAPHY;  Surgeon: Burnell Blanks, MD;  Location: [place] CV LAB;  Service: Cardiovascular;  Laterality: N/A;  . LUMBAR LAMINECTOMY/DECOMPRESSION MICRODISCECTOMY Bilateral 06/11/2019   Procedure: Bilateral Lumbar Four-FiveLaminectomy/Foraminotomy;  Surgeon: Earnie Larsson, MD;  Location: [place];  Service: Neurosurgery;  Laterality: Bilateral;  posterior  . SYNOVECTOMY Bilateral 05/02/2020   Procedure: SYNOVECTOMY;  Surgeon: Newt Minion, MD;  Location: [place];  Service: Orthopedics;  Laterality: Bilateral;  .  TEE WITHOUT CARDIOVERSION N/A 03/26/2020   Procedure: TRANSESOPHAGEAL ECHOCARDIOGRAM (TEE);  Surgeon: Jerline Pain, MD;  Location: [facility] ENDOSCOPY;  Service: Cardiovascular;  Laterality: N/A;  . TEE WITHOUT CARDIOVERSION N/A 04/13/2020   Procedure: TRANSESOPHAGEAL ECHOCARDIOGRAM (TEE);  Surgeon: Geralynn Rile, MD;  Location: [place];  Service: Cardiovascular;  Laterality: N/A;  . TEE WITHOUT CARDIOVERSION N/A 04/16/2020   Procedure: TRANSESOPHAGEAL ECHOCARDIOGRAM (TEE);  Surgeon: Gaye Pollack, MD;  Location: [place];  Service: Open Heart Surgery;  Laterality: N/A;  . TEE WITHOUT CARDIOVERSION N/A  04/29/2020   Procedure: TRANSESOPHAGEAL ECHOCARDIOGRAM (TEE);  Surgeon: Skeet Latch, MD;  Location: [facility] ENDOSCOPY;  Service: Cardiovascular;  Laterality: N/A;    Current Medications: Current Meds  Medication Sig  . amiodarone (PACERONE) 200 MG tablet Take 100 mg by mouth 2 (two) times daily.  Marland Kitchen amoxicillin (AMOXIL) 500 MG tablet Take 500 mg by mouth 2 (two) times daily.  . cyclobenzaprine (FLEXERIL) 5 MG tablet Take 1 tablet (5 mg total) by mouth 3 (three) times daily as needed for muscle spasms.  . Empagliflozin (JARDIANCE PO) Take 10 mg by mouth daily. Unsure of dose   . ezetimibe (ZETIA) 10 MG tablet Take 1 tablet (10 mg total) by mouth at bedtime.  . ferrous sulfate 325 (65 FE) MG tablet Take 1 tablet (325 mg total) by mouth 2 (two) times daily with a meal.  . levothyroxine (SYNTHROID) 25 MCG tablet Take 1 tablet (25 mcg total) by mouth daily before breakfast.  . metoprolol tartrate (LOPRESSOR) 25 MG tablet Take 0.5 tablets (12.5 mg total) by mouth 2 (two) times daily.  . potassium chloride SA (KLOR-CON) 20 MEQ tablet Take 1 tablet (20 mEq total) by mouth 2 (two) times daily.  . pregabalin (LYRICA) 100 MG capsule Take 1 capsule (100 mg total) by mouth 3 (three) times daily.  Marland Kitchen senna (SENOKOT) 8.6 MG TABS tablet Take 1 tablet (8.6 mg total) by mouth at bedtime.  . vitamin B-12 (CYANOCOBALAMIN) 1000 MCG tablet Take 1,000 mcg by mouth daily.  . vitamin C (ASCORBIC ACID) 500 MG tablet Take 500 mg by mouth daily.   Marland Kitchen zinc gluconate 50 MG tablet Take 50 mg by mouth daily.  . [DISCONTINUED] apixaban (ELIQUIS) 5 MG TABS tablet Take 1 tablet (5 mg total) by mouth 2 (two) times daily.     Allergies:   Atorvastatin and Nebivolol   Social History   Socioeconomic History  . Marital status: Married    Spouse name: [name]  . Number of children: 1  . Years of education: Not on file  . Highest education level: GED or equivalent  Occupational History    Employer: [employer]    Tobacco Use  . Smoking status: Never Smoker  . Smokeless tobacco: Never Used  Vaping Use  . Vaping Use: Never used  Substance and Sexual Activity  . Alcohol use: Not Currently  . Drug use: Never  . Sexual activity: Not Currently  Other Topics Concern  . Not on file  Social History Narrative   He drives a truck for [employer].   He lives with wife in a one-level home.   Highest level of education:  GED      Patient is right-handed. There are 8 steps to enter his home.   Social Determinants of Health   Financial Resource Strain:   . Difficulty of Paying Living Expenses: Not on file  Food Insecurity:   . Worried About Charity fundraiser in the Last Year:  Not on file  . Ran Out of Food in the Last Year: Not on file  Transportation Needs:   . Lack of Transportation (Medical): Not on file  . Lack of Transportation (Non-Medical): Not on file  Physical Activity:   . Days of Exercise per Week: Not on file  . Minutes of Exercise per Session: Not on file  Stress:   . Feeling of Stress : Not on file  Social Connections:   . Frequency of Communication with Friends and Family: Not on file  . Frequency of Social Gatherings with Friends and Family: Not on file  . Attends Religious Services: Not on file  . Active Member of Clubs or Organizations: Not on file  . Attends Archivist Meetings: Not on file  . Marital Status: Not on file     Family History: The patient's family history includes Brain cancer in his mother; Heart disease in his maternal grandmother; Prostate cancer in his brother.  ROS:   Please see the history of present illness.    All other systems reviewed and are negative.  EKGs/Labs/Other Studies Reviewed:    The following studies were reviewed today: EKG reveals atrial fibrillation with slow ventricular rate.   Recent Labs: 05/14/2020: Magnesium 2.2 05/18/2020: B Natriuretic Peptide 134.7 05/20/2020: ALT 37 06/17/2020: BUN 32; Creat 1.59;  Hemoglobin 11.4; Platelets 264; Potassium 4.5; Sodium 140 06/24/2020: TSH 6.720  Recent Lipid Panel    Component Value Date/Time   CHOL 217 (H) 05/21/2018 1130   TRIG 61 05/21/2018 1130   HDL 100 05/21/2018 1130   CHOLHDL 2.2 05/21/2018 1130   LDLCALC 105 (H) 05/21/2018 1130    Physical Exam:    VS:  BP 116/70   Pulse (!) 47   Ht 5\' 10"  (1.778 m)   Wt 176 lb (79.8 kg)   SpO2 99%   BMI 25.25 kg/m     Wt Readings from Last 3 Encounters:  07/28/20 176 lb (79.8 kg)  06/24/20 171 lb 9.6 oz (77.8 kg)  06/17/20 178 lb (80.7 kg)     GEN: Patient is in no acute distress HEENT: Normal NECK: No JVD; No carotid bruits LYMPHATICS: No lymphadenopathy CARDIAC: Hear sounds regular, 2/6 systolic murmur at the apex. RESPIRATORY:  Clear to auscultation without rales, wheezing or rhonchi  ABDOMEN: Soft, non-tender, non-distended MUSCULOSKELETAL:  No edema; No deformity  SKIN: Warm and dry NEUROLOGIC:  Alert and oriented x 3 PSYCHIATRIC:  Normal affect   Signed, Jenean Lindau, MD  07/28/2020 11:15 AM    [facility]

## 2020-07-28 NOTE — Telephone Encounter (Signed)
COVID-19 Pre-Screening Questions:07/28/20  Do you currently have a fever (>100 F), chills or unexplained body aches? NO  Are you currently experiencing new cough, shortness of breath, sore throat, runny nose? NO .  Have you been in contact with someone that is currently pending confirmation of Covid19 testing or has been confirmed to have the Covid19 virus? NO  **If the patient answers NO to ALL questions -  advise the patient to please call the clinic before coming to the office should any symptoms develop.    

## 2020-07-29 ENCOUNTER — Encounter: Payer: Self-pay | Admitting: Internal Medicine

## 2020-07-29 ENCOUNTER — Ambulatory Visit (INDEPENDENT_AMBULATORY_CARE_PROVIDER_SITE_OTHER): Payer: BC Managed Care – PPO | Admitting: Internal Medicine

## 2020-07-29 DIAGNOSIS — R7881 Bacteremia: Secondary | ICD-10-CM | POA: Diagnosis not present

## 2020-07-29 DIAGNOSIS — M4646 Discitis, unspecified, lumbar region: Secondary | ICD-10-CM

## 2020-07-29 DIAGNOSIS — M009 Pyogenic arthritis, unspecified: Secondary | ICD-10-CM

## 2020-07-29 DIAGNOSIS — B952 Enterococcus as the cause of diseases classified elsewhere: Secondary | ICD-10-CM | POA: Diagnosis not present

## 2020-07-29 LAB — BASIC METABOLIC PANEL
BUN/Creatinine Ratio: 22 (ref 10–24)
BUN: 29 mg/dL — ABNORMAL HIGH (ref 8–27)
CO2: 24 mmol/L (ref 20–29)
Calcium: 9.9 mg/dL (ref 8.6–10.2)
Chloride: 102 mmol/L (ref 96–106)
Creatinine, Ser: 1.31 mg/dL — ABNORMAL HIGH (ref 0.76–1.27)
GFR calc Af Amer: 64 mL/min/{1.73_m2} (ref >59)
GFR calc non Af Amer: 56 mL/min/{1.73_m2} — ABNORMAL LOW (ref >59)
Glucose: 138 mg/dL — ABNORMAL HIGH (ref 65–99)
Potassium: 5.1 mmol/L (ref 3.5–5.2)
Sodium: 139 mmol/L (ref 134–144)

## 2020-07-29 LAB — HEPATIC FUNCTION PANEL
ALT: 25 IU/L (ref 0–44)
AST: 21 IU/L (ref 0–40)
Albumin: 4.4 g/dL (ref 3.8–4.8)
Alkaline Phosphatase: 142 IU/L — ABNORMAL HIGH (ref 48–121)
Bilirubin Total: 0.5 mg/dL (ref 0.0–1.2)
Bilirubin, Direct: 0.17 mg/dL (ref 0.00–0.40)
Total Protein: 7.1 g/dL (ref 6.0–8.5)

## 2020-07-29 NOTE — Assessment & Plan Note (Signed)
I suspect that his septic arthritis has been cured to combination of surgery and 4 and half months of antibiotic therapy. I told him that some warmth and swelling is typical after severe joint infection. He is very reluctant to stop amoxicillin now. I will repeat his C-reactive protein today and see him back in 6 weeks.

## 2020-07-29 NOTE — Assessment & Plan Note (Addendum)
I suspect that his discitis has been cured but he is very reluctant to stop amoxicillin. I will repeat his C-reactive protein and see him back in 6 weeks.

## 2020-07-29 NOTE — Assessment & Plan Note (Signed)
I think his enterococcal bacteremia and prosthetic aortic valve infection has been cured through a combination of surgery and prolonged antibiotics.

## 2020-07-29 NOTE — Progress Notes (Signed)
[facility]  Patient Active Problem List   Diagnosis Date Noted  . Lumbar discitis 05/04/2020    Priority: High  . Prosthetic valve endocarditis ([place]) 04/13/2020    Priority: High  . Septic arthritis of knee, bilateral ([place]) 03/23/2020    Priority: High  . Bacteremia due to Enterococcus 03/21/2020    Priority: High  . S/P AVR 01/17/2020    Priority: High  . History of fusion of cervical spine 03/23/2020    Priority: Medium  . Drug-induced hypotension   . Hypotension due to drugs   . Hypokalemia   . Hypotension due to hypovolemia   . Anasarca   . History of prolonged Q-T interval on ECG   . Peripheral edema   . Labile blood glucose   . Atrial fibrillation with RVR ([place])   . Postoperative pain   . Transaminitis   . Hypoalbuminemia due to protein-calorie malnutrition ([place])   . Drug induced constipation   . Controlled type 2 diabetes mellitus with hyperglycemia, with long-term current use of insulin ([place])   . AKI (acute kidney injury) ([place])   . Pressure injury of skin 05/14/2020  . Debility 05/14/2020  . Acute blood loss anemia 05/02/2020  . Prolonged QT interval 05/02/2020  . Transient hypotension 05/02/2020  . Persistent atrial fibrillation ([place])   . Aortic valve abscess 04/13/2020  . Endocarditis 04/13/2020  . Typical atrial flutter ([place])   . Discitis 04/07/2020  . Moderate protein-calorie malnutrition ([place]) 03/24/2020  . Pseudogout of left knee 03/21/2020  . ARF (acute renal failure) ([place]) 03/20/2020  . Renal insufficiency 01/17/2020  . Pre-operative cardiovascular examination 01/03/2020  . Acute otitis externa of left ear 11/13/2019  . Acute suppurative otitis media of left ear without spontaneous rupture of tympanic membrane 11/13/2019  . Conductive hearing loss of left ear 11/13/2019  . Tinnitus of left ear 11/13/2019  . Lumbar stenosis with neurogenic claudication 06/11/2019  . Cervical myelopathy ([place]) 04/22/2019  .  Preoperative cardiovascular examination 04/17/2019  . Intermittent claudication ([place]) 10/31/2018  . Coronary artery disease of native artery of native heart with stable angina pectoris ([place])   . Abnormal nuclear stress test 05/03/2018  . Aortic stenosis 05/03/2018  . Acid reflux 02/15/2018  . Arthritis 02/15/2018  . Hyperactive gag reflex 02/15/2018  . Macular degeneration 02/15/2018  . Abnormal LFTs (liver function tests) 10/31/2016  . Coronary artery disease involving native coronary artery of native heart without angina pectoris 05/30/2016  . Diabetes mellitus due to underlying condition with unspecified complications ([place]) 16/96/7893  . Dyslipidemia 05/30/2016  . Essential hypertension 05/30/2016  . PAF (paroxysmal atrial fibrillation) ([place]) 05/30/2016    Patient's Medications  New Prescriptions   No medications on file  Previous Medications   AMIODARONE (PACERONE) 200 MG TABLET    Take 100 mg by mouth 2 (two) times daily.   AMOXICILLIN (AMOXIL) 500 MG TABLET    Take 500 mg by mouth 2 (two) times daily.   ASPERCREME LIDOCAINE 4 % PTCH    Apply topically.   CYCLOBENZAPRINE (FLEXERIL) 5 MG TABLET    Take 1 tablet (5 mg total) by mouth 3 (three) times daily as needed for muscle spasms.   EMPAGLIFLOZIN (JARDIANCE PO)    Take 10 mg by mouth daily. Unsure of dose    EZETIMIBE (ZETIA) 10 MG TABLET    Take 1 tablet (10 mg total) by mouth at bedtime.   FERROUS SULFATE 325 (65 FE) MG TABLET  Take 1 tablet (325 mg total) by mouth 2 (two) times daily with a meal.   LEVOTHYROXINE (SYNTHROID) 25 MCG TABLET    Take 1 tablet (25 mcg total) by mouth daily before breakfast.   METOPROLOL TARTRATE (LOPRESSOR) 25 MG TABLET    Take 0.5 tablets (12.5 mg total) by mouth every morning.   POTASSIUM CHLORIDE SA (KLOR-CON) 20 MEQ TABLET    Take 1 tablet (20 mEq total) by mouth 2 (two) times daily.   PREGABALIN (LYRICA) 100 MG CAPSULE    Take 1 capsule (100 mg total) by mouth 3 (three) times daily.   SENNA  (SENOKOT) 8.6 MG TABS TABLET    Take 1 tablet (8.6 mg total) by mouth at bedtime.   VITAMIN B-12 (CYANOCOBALAMIN) 1000 MCG TABLET    Take 1,000 mcg by mouth daily.   VITAMIN C (ASCORBIC ACID) 500 MG TABLET    Take 500 mg by mouth daily.    ZINC GLUCONATE 50 MG TABLET    Take 50 mg by mouth daily.  Modified Medications   No medications on file  Discontinued Medications   No medications on file    Subjective: Carrell is in for his routine follow-up visit.  He developed enterococcal bacteremia and was hospitalized on 03/20/2020 with widely disseminated infection.  He previously had prosthetic aortic valve surgery and developed prosthetic valve endocarditis and an aortic root abscess, bilateral knee septic arthritis and lumbar infection.  He underwent redo aortic root implant and knee surgery.  He completed a lengthy course of IV ceftriaxone and ampicillin 05/20/2020 before converting to oral amoxicillin.  He came home from rehab about 1 week ago.  Overall, he is feeling better but he still has significant back and knee pain. However he is not taking any prescription pain medication and he has been able to be more active. He says that he is worried about stopping amoxicillin because he does not want to take any chance of the infection coming back. He has not had any problems tolerating his amoxicillin.  Review of Systems: Review of Systems  Constitutional: Positive for malaise/fatigue. Negative for chills, diaphoresis, fever and weight loss.  HENT: Negative for congestion and sore throat.   Respiratory: Negative for cough and shortness of breath.   Cardiovascular: Negative for chest pain.  Gastrointestinal: Positive for constipation. Negative for abdominal pain, diarrhea, nausea and vomiting.  Genitourinary: Negative for dysuria.  Musculoskeletal: Positive for back pain and joint pain.  Skin: Negative for rash.  Neurological: Negative for headaches.    Past Medical History:  Diagnosis Date  .  Abnormal LFTs (liver function tests) 10/31/2016  . Abnormal nuclear stress test 05/03/2018  . Acid reflux 02/15/2018   not at present  . Acute otitis externa of left ear 11/13/2019  . Acute suppurative otitis media of left ear without spontaneous rupture of tympanic membrane 11/13/2019  . Anemia    low iron  . Aortic stenosis 05/03/2018  . Aortic valve abscess 04/13/2020  . ARF (acute renal failure) ([place]) 03/20/2020  . Arthritis 02/15/2018   Overview:  shoulder  . Bacteremia due to Enterococcus 03/21/2020  . Cervical myelopathy ([place]) 04/22/2019  . Conductive hearing loss of left ear 11/13/2019  . Coronary artery disease involving native coronary artery of native heart without angina pectoris 05/30/2016  . Coronary artery disease of native artery of native heart with stable angina pectoris ([place])    pt. denies  . Diabetes mellitus due to underlying condition with unspecified complications ([place]) 6/31/4970  .  Discitis 04/07/2020  . Dyslipidemia 05/30/2016  . Dyspnea    upon exertion,  as of 06/05/09 not having any recent chest pain  . Dysrhythmia    a fib  . Endocarditis 04/13/2020  . Essential hypertension 05/30/2016  . Heart murmur    Has had an Aortic valve replacement  . History of fusion of cervical spine 03/23/2020  . Hyperactive gag reflex 02/15/2018  . Hypothyroidism   . Intermittent claudication ([place]) 10/31/2018  . Lumbar stenosis with neurogenic claudication 06/11/2019  . Macular degeneration 02/15/2018  . PAF (paroxysmal atrial fibrillation) ([place]) 05/30/2016  . Pre-operative cardiovascular examination 01/03/2020  . Preoperative cardiovascular examination 04/17/2019  . Prosthetic valve endocarditis ([place]) 04/13/2020  . Protein-calorie malnutrition, severe 03/24/2020  . Pseudogout of left knee 03/21/2020  . Renal insufficiency 01/17/2020  . S/P aortic valve replacement 01/17/2020  . S/P AVR 01/17/2020  . Septic arthritis ([place]) 03/23/2020  . Tinnitus of left ear 11/13/2019  . Typical atrial flutter  ([facility])     Social History   Tobacco Use  . Smoking status: Never Smoker  . Smokeless tobacco: Never Used  Vaping Use  . Vaping Use: Never used  Substance Use Topics  . Alcohol use: Not Currently  . Drug use: Never    Family History  Problem Relation Age of Onset  . Prostate cancer Brother   . Heart disease Maternal Grandmother   . Brain cancer Mother     Allergies  Allergen Reactions  . Atorvastatin Other (See Comments)    Urinary retention   . Nebivolol Diarrhea    Objective: Vitals:   07/29/20 1425  Pulse: 67  SpO2: 97%  Weight: 177 lb (80.3 kg)   Body mass index is 25.4 kg/m.  Physical Exam Constitutional:      Comments: He is accompanied by his wife.  His weight is up 5 pounds over the past month. He is now able to ambulate with a walker rather than using a wheelchair.  Cardiovascular:     Rate and Rhythm: Normal rate and regular rhythm.     Heart sounds: Murmur heard.      Comments: He has a 1/6 systolic murmur. Pulmonary:     Effort: Pulmonary effort is normal.     Breath sounds: Normal breath sounds.  Abdominal:     Palpations: Abdomen is soft.     Tenderness: There is no abdominal tenderness.  Musculoskeletal:        General: Tenderness present. No swelling.     Comments: His lumbar incision has healed nicely. The swelling above the incision has decreased. He appears to have a slight kyphotic deformity. There is no redness, fluctuance or tenderness with palpation.  Both knees are warm and slightly swollen, left greater than right. There is no unusual redness.  Skin:    Findings: No rash.  Neurological:     General: No focal deficit present.  Psychiatric:        Mood and Affect: Mood normal.     Lab Results Sed Rate  Date Value  06/17/2020 19 mm/h  05/05/2020 30 mm/hr (H)  04/05/2020 118 mm/hr (H)   CRP  Date Value  06/17/2020 40.8 mg/L (H)  05/05/2020 11.4 mg/dL (H)  04/05/2020 12.4 mg/dL (H)     Problem List Items Addressed This  Visit      High   Septic arthritis of knee, bilateral ([place])    I suspect that his septic arthritis has been cured to combination of surgery and 4 and  half months of antibiotic therapy. I told him that some warmth and swelling is typical after severe joint infection. He is very reluctant to stop amoxicillin now. I will repeat his C-reactive protein today and see him back in 6 weeks.      Lumbar discitis    I suspect that his discitis has been cured but he is very reluctant to stop amoxicillin. I will repeat his C-reactive protein and see him back in 6 weeks.      Relevant Orders   C-reactive protein   Bacteremia due to Enterococcus    I think his enterococcal bacteremia and prosthetic aortic valve infection has been cured through a combination of surgery and prolonged antibiotics.          Michel Bickers, MD [facility] for [facility] [phone] pager   [phone] cell 07/29/2020, 3:09 PM

## 2020-07-30 DIAGNOSIS — M4620 Osteomyelitis of vertebra, site unspecified: Secondary | ICD-10-CM

## 2020-07-30 HISTORY — DX: Osteomyelitis of vertebra, site unspecified: M46.20

## 2020-07-30 LAB — C-REACTIVE PROTEIN: CRP: 12.2 mg/L — ABNORMAL HIGH (ref <8.0)

## 2020-07-31 DIAGNOSIS — R2689 Other abnormalities of gait and mobility: Secondary | ICD-10-CM | POA: Diagnosis not present

## 2020-07-31 DIAGNOSIS — M6281 Muscle weakness (generalized): Secondary | ICD-10-CM | POA: Diagnosis not present

## 2020-08-03 DIAGNOSIS — M6281 Muscle weakness (generalized): Secondary | ICD-10-CM | POA: Diagnosis not present

## 2020-08-03 DIAGNOSIS — R2689 Other abnormalities of gait and mobility: Secondary | ICD-10-CM | POA: Diagnosis not present

## 2020-08-05 DIAGNOSIS — R2689 Other abnormalities of gait and mobility: Secondary | ICD-10-CM | POA: Diagnosis not present

## 2020-08-05 DIAGNOSIS — M6281 Muscle weakness (generalized): Secondary | ICD-10-CM | POA: Diagnosis not present

## 2020-08-13 DIAGNOSIS — M6281 Muscle weakness (generalized): Secondary | ICD-10-CM | POA: Diagnosis not present

## 2020-08-13 DIAGNOSIS — R2689 Other abnormalities of gait and mobility: Secondary | ICD-10-CM | POA: Diagnosis not present

## 2020-08-14 DIAGNOSIS — M6281 Muscle weakness (generalized): Secondary | ICD-10-CM | POA: Diagnosis not present

## 2020-08-14 DIAGNOSIS — I4891 Unspecified atrial fibrillation: Secondary | ICD-10-CM | POA: Diagnosis not present

## 2020-08-14 DIAGNOSIS — Z23 Encounter for immunization: Secondary | ICD-10-CM | POA: Diagnosis not present

## 2020-08-14 DIAGNOSIS — E039 Hypothyroidism, unspecified: Secondary | ICD-10-CM | POA: Diagnosis not present

## 2020-08-14 DIAGNOSIS — E1129 Type 2 diabetes mellitus with other diabetic kidney complication: Secondary | ICD-10-CM | POA: Diagnosis not present

## 2020-08-14 DIAGNOSIS — R809 Proteinuria, unspecified: Secondary | ICD-10-CM | POA: Diagnosis not present

## 2020-08-14 DIAGNOSIS — E785 Hyperlipidemia, unspecified: Secondary | ICD-10-CM | POA: Diagnosis not present

## 2020-08-14 DIAGNOSIS — D509 Iron deficiency anemia, unspecified: Secondary | ICD-10-CM | POA: Diagnosis not present

## 2020-08-14 DIAGNOSIS — R2689 Other abnormalities of gait and mobility: Secondary | ICD-10-CM | POA: Diagnosis not present

## 2020-08-18 DIAGNOSIS — M6281 Muscle weakness (generalized): Secondary | ICD-10-CM | POA: Diagnosis not present

## 2020-08-18 DIAGNOSIS — R2689 Other abnormalities of gait and mobility: Secondary | ICD-10-CM | POA: Diagnosis not present

## 2020-08-19 ENCOUNTER — Encounter (HOSPITAL_COMMUNITY): Payer: Self-pay | Admitting: Surgery

## 2020-08-20 ENCOUNTER — Encounter (HOSPITAL_COMMUNITY): Payer: Self-pay | Admitting: Surgery

## 2020-08-25 DIAGNOSIS — R2689 Other abnormalities of gait and mobility: Secondary | ICD-10-CM | POA: Diagnosis not present

## 2020-08-25 DIAGNOSIS — M6281 Muscle weakness (generalized): Secondary | ICD-10-CM | POA: Diagnosis not present

## 2020-08-27 ENCOUNTER — Other Ambulatory Visit: Payer: Self-pay

## 2020-08-27 ENCOUNTER — Ambulatory Visit (INDEPENDENT_AMBULATORY_CARE_PROVIDER_SITE_OTHER): Payer: BC Managed Care – PPO | Admitting: Cardiology

## 2020-08-27 ENCOUNTER — Encounter: Payer: Self-pay | Admitting: Cardiology

## 2020-08-27 VITALS — BP 149/81 | HR 52 | Ht 70.0 in | Wt 168.0 lb

## 2020-08-27 DIAGNOSIS — E1165 Type 2 diabetes mellitus with hyperglycemia: Secondary | ICD-10-CM

## 2020-08-27 DIAGNOSIS — Z952 Presence of prosthetic heart valve: Secondary | ICD-10-CM

## 2020-08-27 DIAGNOSIS — E088 Diabetes mellitus due to underlying condition with unspecified complications: Secondary | ICD-10-CM | POA: Diagnosis not present

## 2020-08-27 DIAGNOSIS — I10 Essential (primary) hypertension: Secondary | ICD-10-CM | POA: Diagnosis not present

## 2020-08-27 DIAGNOSIS — N289 Disorder of kidney and ureter, unspecified: Secondary | ICD-10-CM

## 2020-08-27 DIAGNOSIS — I480 Paroxysmal atrial fibrillation: Secondary | ICD-10-CM | POA: Diagnosis not present

## 2020-08-27 DIAGNOSIS — I2510 Atherosclerotic heart disease of native coronary artery without angina pectoris: Secondary | ICD-10-CM | POA: Diagnosis not present

## 2020-08-27 DIAGNOSIS — Z794 Long term (current) use of insulin: Secondary | ICD-10-CM

## 2020-08-27 NOTE — Patient Instructions (Signed)
Medication Instructions:  Decrease Amioadrone to 100 mg daily. *If you need a refill on your cardiac medications before your next appointment, please call your pharmacy*   Lab Work: None ordered If you have labs (blood work) drawn today and your tests are completely normal, you will receive your results only by:  [place] (if you have MyChart) OR  A paper copy in the mail If you have any lab test that is abnormal or we need to change your treatment, we will call you to review the results.   Testing/Procedures: None ordered   Follow-Up: At [facility], you and your health needs are our priority.  As part of our continuing mission to provide you with exceptional heart care, we have created designated Provider Care Teams.  These Care Teams include your primary Cardiologist (physician) and Advanced Practice Providers (APPs -  Physician Assistants and Nurse Practitioners) who all work together to provide you with the care you need, when you need it.  We recommend signing up for the patient portal called "MyChart".  Sign up information is provided on this After Visit Summary.  MyChart is used to connect with patients for Virtual Visits (Telemedicine).  Patients are able to view lab/test results, encounter notes, upcoming appointments, etc.  Non-urgent messages can be sent to your provider as well.   To learn more about what you can do with MyChart, go to [email or web address].    Your next appointment:   3 month(s)  The format for your next appointment:   In Person  Provider:   Jyl Heinz, MD   Other Instructions NA

## 2020-08-27 NOTE — Progress Notes (Signed)
Cardiology Office Note:    Date:  08/27/2020   ID:  Johnathan Murray, DOB 05-19-1951, MRN [id number]  PCP:  Nicoletta Dress, MD  Cardiologist:  Jenean Lindau, MD   Referring MD: Nicoletta Dress, MD    ASSESSMENT:    1. Coronary artery disease involving native coronary artery of native heart without angina pectoris   2. Essential hypertension   3. PAF (paroxysmal atrial fibrillation) ([place])   4. Diabetes mellitus due to underlying condition with unspecified complications ([place])   5. S/P AVR   6. Renal insufficiency   7. Controlled type 2 diabetes mellitus with hyperglycemia, with long-term current use of insulin (HCC)    PLAN:    In order of problems listed above:  1. Coronary artery disease: Secondary prevention stressed with the patient.  Importance of compliance with diet medication stressed and she vocalized understanding. 2. Paroxysmal atrial fibrillation: Well-controlled with amiodarone.  We will drop the dose to 100 mg daily. 3. Essential hypertension: Blood pressure stable and diet was emphasized.  He keeps a track of his blood pressures at home. 4. Post aortic valve replacement: Stable. 5. Diabetes mellitus, renal insufficiency: Patient is aware of this and is trying to manage his best with diet.  His primary care physician is helping him manage this issues. 6. Follow-up appointment in a month or earlier if he has any concerns.  He had multiple questions which were answered to satisfaction.   Medication Adjustments/Labs and Tests Ordered: Current medicines are reviewed at length with the patient today.  Concerns regarding medicines are outlined above.  Orders Placed This Encounter  Procedures  . EKG 12-Lead   No orders of the defined types were placed in this encounter.    No chief complaint on file.    History of Present Illness:    Johnathan Murray is a 69 y.o. male.  Patient has past medical history of aortic valve replacement.  Subsequently he  had infections in knee joint infections at the valve was replaced.  He denies any problems at this time and takes care of activities of daily living.  He is trying to ambulate some but to the best of his ability.  He has paroxysmal atrial fibrillation on amiodarone and well controlled.  At the time of my evaluation, the patient is alert awake oriented and in no distress.  Past Medical History:  Diagnosis Date  . Abnormal LFTs (liver function tests) 10/31/2016  . Abnormal nuclear stress test 05/03/2018  . Acid reflux 02/15/2018   not at present  . Acute otitis externa of left ear 11/13/2019  . Acute suppurative otitis media of left ear without spontaneous rupture of tympanic membrane 11/13/2019  . Anemia    low iron  . Aortic stenosis 05/03/2018  . Aortic valve abscess 04/13/2020  . ARF (acute renal failure) ([place]) 03/20/2020  . Arthritis 02/15/2018   Overview:  shoulder  . Bacteremia due to Enterococcus 03/21/2020  . Cervical myelopathy ([place]) 04/22/2019  . Conductive hearing loss of left ear 11/13/2019  . Coronary artery disease involving native coronary artery of native heart without angina pectoris 05/30/2016  . Coronary artery disease of native artery of native heart with stable angina pectoris ([place])    pt. denies  . Diabetes mellitus due to underlying condition with unspecified complications ([place]) 05/22/6377  . Discitis 04/07/2020  . Dyslipidemia 05/30/2016  . Dyspnea    upon exertion,  as of 06/05/09 not having any recent chest pain  . Dysrhythmia  a fib  . Endocarditis 04/13/2020  . Essential hypertension 05/30/2016  . Heart murmur    Has had an Aortic valve replacement  . History of fusion of cervical spine 03/23/2020  . Hyperactive gag reflex 02/15/2018  . Hypothyroidism   . Intermittent claudication ([place]) 10/31/2018  . Lumbar stenosis with neurogenic claudication 06/11/2019  . Macular degeneration 02/15/2018  . PAF (paroxysmal atrial fibrillation) ([place]) 05/30/2016  . Pre-operative  cardiovascular examination 01/03/2020  . Preoperative cardiovascular examination 04/17/2019  . Prosthetic valve endocarditis ([place]) 04/13/2020  . Protein-calorie malnutrition, severe 03/24/2020  . Pseudogout of left knee 03/21/2020  . Renal insufficiency 01/17/2020  . S/P aortic valve replacement 01/17/2020  . S/P AVR 01/17/2020  . Septic arthritis ([place]) 03/23/2020  . Tinnitus of left ear 11/13/2019  . Typical atrial flutter [facility])     Past Surgical History:  Procedure Laterality Date  . ANTERIOR CERVICAL DECOMP/DISCECTOMY FUSION N/A 04/22/2019   Procedure: Anterior Cervical Discectomy and Fusion, Cervical four-five, Cervical five-six;  Surgeon: Earnie Larsson, MD;  Location: [place];  Service: Neurosurgery;  Laterality: N/A;  . ANTERIOR CERVICAL DECOMP/DISCECTOMY FUSION N/A 03/02/2020   Procedure: Anterior Cervical Decompression Fusion  - Cervical three-Cervical four;  Surgeon: Earnie Larsson, MD;  Location: [place];  Service: Neurosurgery;  Laterality: N/A;  . AORTIC VALVE REPLACEMENT    . ASCENDING AORTIC ROOT REPLACEMENT N/A 04/16/2020   Procedure: ASCENDING AORTIC ROOT REPLACEMENT - HOMOGRAFT using [place] 23 MM Cryo Aortic Valve.;  Surgeon: Gaye Pollack, MD;  [facility] OR;  Service: Open Heart Surgery;  Laterality: N/A;  . CARDIAC CATHETERIZATION     several stents  . CARDIAC SURGERY    . CARDIOVERSION N/A 04/13/2020   Procedure: CARDIOVERSION;  Surgeon: Geralynn Rile, MD;  Location: [place];  Service: Cardiovascular;  Laterality: N/A;  . CARDIOVERSION N/A 04/29/2020   Procedure: CARDIOVERSION;  Surgeon: Skeet Latch, MD;  Location[facility] ENDOSCOPY;  Service: Cardiovascular;  Laterality: N/A;  . CORONARY ARTERY BYPASS GRAFT  2017   patient unsure how many, had at [facility]  . HAND SURGERY    . I & D EXTREMITY Bilateral 05/02/2020   Procedure: IRRIGATION AND DEBRIDEMENT BILATERAL KNEES;  Surgeon: Newt Minion, MD;  Location: [place];  Service: Orthopedics;   Laterality: Bilateral;  . IR LUMBAR DISC ASPIRATION W/IMG GUIDE  04/07/2020  . IRRIGATION AND DEBRIDEMENT KNEE Bilateral 03/24/2020   Procedure: ARTHROSCOPY IRRIGATION AND DEBRIDEMENT BILATERAL  KNEE;  Surgeon: Marchia Bond, MD;  Location: [place];  Service: Orthopedics;  Laterality: Bilateral;  . KNEE SURGERY    . LEFT HEART CATH AND CORONARY ANGIOGRAPHY N/A 01/21/2020   Procedure: LEFT HEART CATH AND CORONARY ANGIOGRAPHY;  Surgeon: Belva Crome, MD;  Location: [place] CV LAB;  Service: Cardiovascular;  Laterality: N/A;  . LEFT HEART CATH AND CORS/GRAFTS ANGIOGRAPHY N/A 05/16/2018   Procedure: LEFT HEART CATH AND CORS/GRAFTS ANGIOGRAPHY;  Surgeon: Burnell Blanks, MD;  Location: [place] CV LAB;  Service: Cardiovascular;  Laterality: N/A;  . LUMBAR LAMINECTOMY/DECOMPRESSION MICRODISCECTOMY Bilateral 06/11/2019   Procedure: Bilateral Lumbar Four-FiveLaminectomy/Foraminotomy;  Surgeon: Earnie Larsson, MD;  Location: [place];  Service: Neurosurgery;  Laterality: Bilateral;  posterior  . SYNOVECTOMY Bilateral 05/02/2020   Procedure: SYNOVECTOMY;  Surgeon: Newt Minion, MD;  Location: [place];  Service: Orthopedics;  Laterality: Bilateral;  . TEE WITHOUT CARDIOVERSION N/A 03/26/2020   Procedure: TRANSESOPHAGEAL ECHOCARDIOGRAM (TEE);  Surgeon: Jerline Pain, MD;  Location: [facility] ENDOSCOPY;  Service: Cardiovascular;  Laterality:  N/A;  . TEE WITHOUT CARDIOVERSION N/A 04/13/2020   Procedure: TRANSESOPHAGEAL ECHOCARDIOGRAM (TEE);  Surgeon: Geralynn Rile, MD;  Location: [place];  Service: Cardiovascular;  Laterality: N/A;  . TEE WITHOUT CARDIOVERSION N/A 04/29/2020   Procedure: TRANSESOPHAGEAL ECHOCARDIOGRAM (TEE);  Surgeon: Skeet Latch, MD;  Location: [place];  Service: Cardiovascular;  Laterality: N/A;  . TEE WITHOUT CARDIOVERSION N/A 04/16/2020   Procedure: TRANSESOPHAGEAL ECHOCARDIOGRAM (TEE);  Surgeon: Gaye Pollack, MD;  Location: [place];  Service: Open Heart Surgery;   Laterality: N/A;    Current Medications: Current Meds  Medication Sig  . amiodarone (PACERONE) 200 MG tablet Take 100 mg by mouth 2 (two) times daily.  Marland Kitchen amoxicillin (AMOXIL) 500 MG tablet Take 500 mg by mouth 2 (two) times daily.  . Empagliflozin (JARDIANCE PO) Take 10 mg by mouth daily. Unsure of dose   . ezetimibe (ZETIA) 10 MG tablet Take 1 tablet (10 mg total) by mouth at bedtime.  . ferrous sulfate 325 (65 FE) MG tablet Take 1 tablet (325 mg total) by mouth 2 (two) times daily with a meal.  . levothyroxine (SYNTHROID) 25 MCG tablet Take 1 tablet (25 mcg total) by mouth daily before breakfast.  . metoprolol tartrate (LOPRESSOR) 25 MG tablet Take 12.5 mg by mouth 2 (two) times daily.  . potassium chloride (KLOR-CON) 20 MEQ packet Take 20 mEq by mouth daily.  Marland Kitchen senna (SENOKOT) 8.6 MG TABS tablet Take 1 tablet (8.6 mg total) by mouth at bedtime.  . vitamin B-12 (CYANOCOBALAMIN) 1000 MCG tablet Take 1,000 mcg by mouth daily.  . vitamin C (ASCORBIC ACID) 500 MG tablet Take 500 mg by mouth daily.   Marland Kitchen zinc gluconate 50 MG tablet Take 50 mg by mouth daily.     Allergies:   Atorvastatin and Nebivolol   Social History   Socioeconomic History  . Marital status: Married    Spouse name: [name]  . Number of children: 1  . Years of education: Not on file  . Highest education level: GED or equivalent  Occupational History    Employer: [employer]  Tobacco Use  . Smoking status: Never Smoker  . Smokeless tobacco: Never Used  Vaping Use  . Vaping Use: Never used  Substance and Sexual Activity  . Alcohol use: Not Currently  . Drug use: Never  . Sexual activity: Not Currently  Other Topics Concern  . Not on file  Social History Narrative   He drives a truck for [employer].   He lives with wife in a one-level home.   Highest level of education:  GED      Patient is right-handed. There are 8 steps to enter his home.   Social Determinants of Health   Financial  Resource Strain:   . Difficulty of Paying Living Expenses: Not on file  Food Insecurity:   . Worried About Charity fundraiser in the Last Year: Not on file  . Ran Out of Food in the Last Year: Not on file  Transportation Needs:   . Lack of Transportation (Medical): Not on file  . Lack of Transportation (Non-Medical): Not on file  Physical Activity:   . Days of Exercise per Week: Not on file  . Minutes of Exercise per Session: Not on file  Stress:   . Feeling of Stress : Not on file  Social Connections:   . Frequency of Communication with Friends and Family: Not on file  . Frequency of Social Gatherings with Friends and Family: Not  on file  . Attends Religious Services: Not on file  . Active Member of Clubs or Organizations: Not on file  . Attends Archivist Meetings: Not on file  . Marital Status: Not on file     Family History: The patient's family history includes Brain cancer in his mother; Heart disease in his maternal grandmother; Prostate cancer in his brother.  ROS:   Please see the history of present illness.    All other systems reviewed and are negative.  EKGs/Labs/Other Studies Reviewed:    The following studies were reviewed today: I discussed my findings with the patient at length.   Recent Labs: 05/14/2020: Magnesium 2.2 05/18/2020: B Natriuretic Peptide 134.7 06/17/2020: Hemoglobin 11.4; Platelets 264 06/24/2020: TSH 6.720 07/28/2020: ALT 25; BUN 29; Creatinine, Ser 1.31; Potassium 5.1; Sodium 139  Recent Lipid Panel    Component Value Date/Time   CHOL 217 (H) 05/21/2018 1130   TRIG 61 05/21/2018 1130   HDL 100 05/21/2018 1130   CHOLHDL 2.2 05/21/2018 1130   LDLCALC 105 (H) 05/21/2018 1130    Physical Exam:    VS:  BP (!) 149/81   Pulse (!) 52   Ht 5\' 10"  (1.778 m)   Wt 168 lb (76.2 kg)   SpO2 97%   BMI 24.11 kg/m     Wt Readings from Last 3 Encounters:  08/27/20 168 lb (76.2 kg)  07/29/20 177 lb (80.3 kg)  07/28/20 176 lb (79.8 kg)       GEN: Patient is in no acute distress HEENT: Normal NECK: No JVD; No carotid bruits LYMPHATICS: No lymphadenopathy CARDIAC: Hear sounds regular, 2/6 systolic murmur at the apex. RESPIRATORY:  Clear to auscultation without rales, wheezing or rhonchi  ABDOMEN: Soft, non-tender, non-distended MUSCULOSKELETAL:  No edema; No deformity  SKIN: Warm and dry NEUROLOGIC:  Alert and oriented x 3 PSYCHIATRIC:  Normal affect   Signed, Jenean Lindau, MD  08/27/2020 11:47 AM    [place]

## 2020-08-28 ENCOUNTER — Other Ambulatory Visit: Payer: Self-pay | Admitting: Cardiology

## 2020-09-01 DIAGNOSIS — I10 Essential (primary) hypertension: Secondary | ICD-10-CM | POA: Diagnosis not present

## 2020-09-03 DIAGNOSIS — R2689 Other abnormalities of gait and mobility: Secondary | ICD-10-CM | POA: Diagnosis not present

## 2020-09-03 DIAGNOSIS — M6281 Muscle weakness (generalized): Secondary | ICD-10-CM | POA: Diagnosis not present

## 2020-09-09 ENCOUNTER — Ambulatory Visit (INDEPENDENT_AMBULATORY_CARE_PROVIDER_SITE_OTHER): Payer: BC Managed Care – PPO | Admitting: Internal Medicine

## 2020-09-09 ENCOUNTER — Other Ambulatory Visit: Payer: Self-pay

## 2020-09-09 ENCOUNTER — Encounter: Payer: Self-pay | Admitting: Internal Medicine

## 2020-09-09 DIAGNOSIS — M009 Pyogenic arthritis, unspecified: Secondary | ICD-10-CM

## 2020-09-09 DIAGNOSIS — M4646 Discitis, unspecified, lumbar region: Secondary | ICD-10-CM

## 2020-09-09 DIAGNOSIS — T826XXD Infection and inflammatory reaction due to cardiac valve prosthesis, subsequent encounter: Secondary | ICD-10-CM | POA: Diagnosis not present

## 2020-09-09 DIAGNOSIS — I38 Endocarditis, valve unspecified: Secondary | ICD-10-CM

## 2020-09-09 DIAGNOSIS — Z6826 Body mass index (BMI) 26.0-26.9, adult: Secondary | ICD-10-CM | POA: Diagnosis not present

## 2020-09-09 DIAGNOSIS — M4620 Osteomyelitis of vertebra, site unspecified: Secondary | ICD-10-CM | POA: Diagnosis not present

## 2020-09-09 NOTE — Assessment & Plan Note (Signed)
He is improving clinically and his inflammatory markers are coming down.  Not surprisingly, he is very reluctant to stop amoxicillin because of concerns that his infection may come back.  We will repeat his inflammatory markers today.  He will continue amoxicillin and follow-up in 6 weeks.

## 2020-09-09 NOTE — Assessment & Plan Note (Signed)
I am quite hopeful that has enterococcal bacteremia and prosthetic aortic valve endocarditis have been cured but I discussed with him the reality that the only test of cure would be to eventually stop amoxicillin and wait and see what happens.  He prefers to continue amoxicillin for now.

## 2020-09-09 NOTE — Assessment & Plan Note (Signed)
Suspect that his septic arthritis has been cured through a combination of a lengthy course of antibiotic therapy and bilateral knee surgery.

## 2020-09-09 NOTE — Progress Notes (Signed)
[facility] for Infectious Disease  Patient Active Problem List   Diagnosis Date Noted  . Lumbar discitis 05/04/2020    Priority: High  . Prosthetic valve endocarditis ([place]) 04/13/2020    Priority: High  . Septic arthritis of knee, bilateral ([place]) 03/23/2020    Priority: High  . Bacteremia due to Enterococcus 03/21/2020    Priority: High  . S/P AVR 01/17/2020    Priority: High  . History of fusion of cervical spine 03/23/2020    Priority: Medium  . Drug-induced hypotension   . Hypotension due to drugs   . Hypokalemia   . Hypotension due to hypovolemia   . Anasarca   . History of prolonged Q-T interval on ECG   . Peripheral edema   . Labile blood glucose   . Atrial fibrillation with RVR ([place])   . Postoperative pain   . Transaminitis   . Hypoalbuminemia due to protein-calorie malnutrition ([place])   . Drug induced constipation   . Controlled type 2 diabetes mellitus with hyperglycemia, with long-term current use of insulin ([place])   . AKI (acute kidney injury) ([place])   . Pressure injury of skin 05/14/2020  . Debility 05/14/2020  . Acute blood loss anemia 05/02/2020  . Prolonged QT interval 05/02/2020  . Transient hypotension 05/02/2020  . Persistent atrial fibrillation ([place])   . Aortic valve abscess 04/13/2020  . Endocarditis 04/13/2020  . Typical atrial flutter ([place])   . Discitis 04/07/2020  . Moderate protein-calorie malnutrition ([place]) 03/24/2020  . Pseudogout of left knee 03/21/2020  . ARF (acute renal failure) ([place]) 03/20/2020  . Renal insufficiency 01/17/2020  . Pre-operative cardiovascular examination 01/03/2020  . Acute otitis externa of left ear 11/13/2019  . Acute suppurative otitis media of left ear without spontaneous rupture of tympanic membrane 11/13/2019  . Conductive hearing loss of left ear 11/13/2019  . Tinnitus of left ear 11/13/2019  . Lumbar stenosis with neurogenic claudication 06/11/2019  . Cervical myelopathy ([place]) 04/22/2019  .  Preoperative cardiovascular examination 04/17/2019  . Intermittent claudication ([place]) 10/31/2018  . Coronary artery disease of native artery of native heart with stable angina pectoris ([place])   . Abnormal nuclear stress test 05/03/2018  . Aortic stenosis 05/03/2018  . Acid reflux 02/15/2018  . Arthritis 02/15/2018  . Hyperactive gag reflex 02/15/2018  . Macular degeneration 02/15/2018  . Abnormal LFTs (liver function tests) 10/31/2016  . Coronary artery disease involving native coronary artery of native heart without angina pectoris 05/30/2016  . Diabetes mellitus due to underlying condition with unspecified complications ([place]) 38/75/6433  . Dyslipidemia 05/30/2016  . Essential hypertension 05/30/2016  . PAF (paroxysmal atrial fibrillation) ([place]) 05/30/2016    Patient's Medications  New Prescriptions   No medications on file  Previous Medications   AMIODARONE (PACERONE) 200 MG TABLET    Take 100 mg by mouth 2 (two) times daily.   AMLODIPINE (NORVASC) 5 MG TABLET    Take 5 mg by mouth daily.   AMOXICILLIN (AMOXIL) 500 MG TABLET    Take 500 mg by mouth 2 (two) times daily.   EMPAGLIFLOZIN (JARDIANCE PO)    Take 10 mg by mouth daily. Unsure of dose    EZETIMIBE (ZETIA) 10 MG TABLET    TAKE 1 TABLET BY MOUTH EVERYDAY AT BEDTIME   FERROUS SULFATE 325 (65 FE) MG TABLET    Take 1 tablet (325 mg total) by mouth 2 (two) times daily with a meal.   LEVOTHYROXINE (SYNTHROID) 25 MCG TABLET  Take 1 tablet (25 mcg total) by mouth daily before breakfast.   METOPROLOL TARTRATE (LOPRESSOR) 25 MG TABLET    Take 12.5 mg by mouth 2 (two) times daily.   POTASSIUM CHLORIDE (KLOR-CON) 20 MEQ PACKET    Take 20 mEq by mouth daily.   SENNA (SENOKOT) 8.6 MG TABS TABLET    Take 1 tablet (8.6 mg total) by mouth at bedtime.   VITAMIN B-12 (CYANOCOBALAMIN) 1000 MCG TABLET    Take 1,000 mcg by mouth daily.   VITAMIN C (ASCORBIC ACID) 500 MG TABLET    Take 500 mg by mouth daily.    ZINC GLUCONATE 50 MG TABLET     Take 50 mg by mouth daily.  Modified Medications   No medications on file  Discontinued Medications   No medications on file    Subjective: Johnathan Murray is in for his routine follow-up visit.  He developed enterococcal bacteremia and was hospitalized on 03/20/2020 with widely disseminated infection.  He previously had prosthetic aortic valve surgery and developed prosthetic valve endocarditis and an aortic root abscess, bilateral knee septic arthritis and lumbar infection.  He underwent redo aortic root implant and knee surgery.  He completed a lengthy course of IV ceftriaxone and ampicillin 05/20/2020 before converting to oral amoxicillin.    He is still having back and knee pain but overall he is feeling much better.  He has been able to be more active.  He no longer needs his walker.  He has not had any fever, nausea, vomiting or diarrhea.  He is tolerating amoxicillin well.  He has received his initial 2 doses of the Moderna 12-lead vaccine.  He and his wife are planning on going to cruise [place] in mid December.  Review of Systems: Review of Systems  Constitutional: Negative for chills, diaphoresis, fever, malaise/fatigue and weight loss.  HENT: Negative for congestion and sore throat.   Respiratory: Negative for cough and shortness of breath.   Cardiovascular: Negative for chest pain.  Gastrointestinal: Negative for abdominal pain, constipation, diarrhea, nausea and vomiting.  Genitourinary: Negative for dysuria.  Musculoskeletal: Positive for back pain and joint pain.  Skin: Negative for rash.  Neurological: Negative for headaches.    Past Medical History:  Diagnosis Date  . Abnormal LFTs (liver function tests) 10/31/2016  . Abnormal nuclear stress test 05/03/2018  . Acid reflux 02/15/2018   not at present  . Acute otitis externa of left ear 11/13/2019  . Acute suppurative otitis media of left ear without spontaneous rupture of tympanic membrane 11/13/2019  . Anemia     low iron  . Aortic stenosis 05/03/2018  . Aortic valve abscess 04/13/2020  . ARF (acute renal failure) ([place]) 03/20/2020  . Arthritis 02/15/2018   Overview:  shoulder  . Bacteremia due to Enterococcus 03/21/2020  . Cervical myelopathy ([place]) 04/22/2019  . Conductive hearing loss of left ear 11/13/2019  . Coronary artery disease involving native coronary artery of native heart without angina pectoris 05/30/2016  . Coronary artery disease of native artery of native heart with stable angina pectoris ([place])    pt. denies  . Diabetes mellitus due to underlying condition with unspecified complications ([place]) 4/85/4627  . Discitis 04/07/2020  . Dyslipidemia 05/30/2016  . Dyspnea    upon exertion,  as of 06/05/09 not having any recent chest pain  . Dysrhythmia    a fib  . Endocarditis 04/13/2020  . Essential hypertension 05/30/2016  . Heart murmur    Has had an Aortic  valve replacement  . History of fusion of cervical spine 03/23/2020  . Hyperactive gag reflex 02/15/2018  . Hypothyroidism   . Intermittent claudication ([place]) 10/31/2018  . Lumbar stenosis with neurogenic claudication 06/11/2019  . Macular degeneration 02/15/2018  . PAF (paroxysmal atrial fibrillation) ([place]) 05/30/2016  . Pre-operative cardiovascular examination 01/03/2020  . Preoperative cardiovascular examination 04/17/2019  . Prosthetic valve endocarditis ([place]) 04/13/2020  . Protein-calorie malnutrition, severe 03/24/2020  . Pseudogout of left knee 03/21/2020  . Renal insufficiency 01/17/2020  . S/P aortic valve replacement 01/17/2020  . S/P AVR 01/17/2020  . Septic arthritis ([place]) 03/23/2020  . Tinnitus of left ear 11/13/2019  . Typical atrial flutter (HCC)     Social History   Tobacco Use  . Smoking status: Never Smoker  . Smokeless tobacco: Never Used  Vaping Use  . Vaping Use: Never used  Substance Use Topics  . Alcohol use: Not Currently  . Drug use: Never    Family History  Problem Relation Age of Onset  . Prostate cancer Brother    . Heart disease Maternal Grandmother   . Brain cancer Mother     Allergies  Allergen Reactions  . Atorvastatin Other (See Comments)    Urinary retention   . Nebivolol Diarrhea    Objective: Vitals:   09/09/20 1051  BP: 132/69  Pulse: (!) 58  Temp: 97.7 F (36.5 C)  TempSrc: Oral  Weight: 184 lb (83.5 kg)   Body mass index is 26.4 kg/m.  Physical Exam Constitutional:      Comments: He is in good spirits.  Cardiovascular:     Rate and Rhythm: Normal rate and regular rhythm.     Heart sounds: Murmur heard.      Comments: He has a 1/6 systolic murmur. Pulmonary:     Effort: Pulmonary effort is normal.     Breath sounds: Normal breath sounds.  Abdominal:     Palpations: Abdomen is soft.     Tenderness: There is no abdominal tenderness.  Musculoskeletal:        General: Tenderness present. No swelling.     Comments: His knee incisions are nicely healed.  There is no unusual swelling, redness or warmth.  Skin:    Findings: No rash.  Neurological:     General: No focal deficit present.  Psychiatric:        Mood and Affect: Mood normal.     Lab Results Sed Rate  Date Value  06/17/2020 19 mm/h  05/05/2020 30 mm/hr (H)  04/05/2020 118 mm/hr (H)   CRP  Date Value  07/29/2020 12.2 mg/L (H)  06/17/2020 40.8 mg/L (H)  05/05/2020 11.4 mg/dL (H)     Problem List Items Addressed This Visit      High   Septic arthritis of knee, bilateral ([place])    Suspect that his septic arthritis has been cured through a combination of a lengthy course of antibiotic therapy and bilateral knee surgery.      Prosthetic valve endocarditis ([place])    I am quite hopeful that has enterococcal bacteremia and prosthetic aortic valve endocarditis have been cured but I discussed with him the reality that the only test of cure would be to eventually stop amoxicillin and wait and see what happens.  He prefers to continue amoxicillin for now.      Relevant Medications   amLODipine  (NORVASC) 5 MG tablet   Lumbar discitis    He is improving clinically and his inflammatory markers are coming down.  Not surprisingly, he is very reluctant to stop amoxicillin because of concerns that his infection may come back.  We will repeat his inflammatory markers today.  He will continue amoxicillin and follow-up in 6 weeks.      Relevant Orders   C-reactive protein   Sedimentation rate       Michel Bickers, MD [facility] for Infectious [place] [phone] pager   [phone] cell 09/09/2020, 11:27 AM

## 2020-09-10 ENCOUNTER — Other Ambulatory Visit: Payer: Self-pay | Admitting: Neurosurgery

## 2020-09-10 DIAGNOSIS — M4620 Osteomyelitis of vertebra, site unspecified: Secondary | ICD-10-CM

## 2020-09-10 LAB — C-REACTIVE PROTEIN: CRP: 5.1 mg/L (ref <8.0)

## 2020-09-10 LAB — SEDIMENTATION RATE: Sed Rate: 2 mm/h (ref 0–20)

## 2020-09-22 DIAGNOSIS — R600 Localized edema: Secondary | ICD-10-CM | POA: Diagnosis not present

## 2020-09-22 DIAGNOSIS — E1129 Type 2 diabetes mellitus with other diabetic kidney complication: Secondary | ICD-10-CM | POA: Diagnosis not present

## 2020-09-22 DIAGNOSIS — R809 Proteinuria, unspecified: Secondary | ICD-10-CM | POA: Diagnosis not present

## 2020-09-22 DIAGNOSIS — I10 Essential (primary) hypertension: Secondary | ICD-10-CM | POA: Diagnosis not present

## 2020-10-07 ENCOUNTER — Ambulatory Visit
Admission: RE | Admit: 2020-10-07 | Discharge: 2020-10-07 | Disposition: A | Discharge status: Home / Self Care | Payer: BC Managed Care – PPO | Source: Ambulatory Visit | Attending: Neurosurgery | Admitting: Neurosurgery

## 2020-10-07 DIAGNOSIS — M4620 Osteomyelitis of vertebra, site unspecified: Secondary | ICD-10-CM

## 2020-10-07 DIAGNOSIS — M48061 Spinal stenosis, lumbar region without neurogenic claudication: Secondary | ICD-10-CM | POA: Diagnosis not present

## 2020-10-21 ENCOUNTER — Ambulatory Visit (INDEPENDENT_AMBULATORY_CARE_PROVIDER_SITE_OTHER): Payer: BC Managed Care – PPO | Admitting: Internal Medicine

## 2020-10-21 ENCOUNTER — Encounter: Payer: Self-pay | Admitting: Internal Medicine

## 2020-10-21 ENCOUNTER — Other Ambulatory Visit: Payer: Self-pay

## 2020-10-21 DIAGNOSIS — T826XXD Infection and inflammatory reaction due to cardiac valve prosthesis, subsequent encounter: Secondary | ICD-10-CM | POA: Diagnosis not present

## 2020-10-21 DIAGNOSIS — Z6826 Body mass index (BMI) 26.0-26.9, adult: Secondary | ICD-10-CM | POA: Diagnosis not present

## 2020-10-21 DIAGNOSIS — I38 Endocarditis, valve unspecified: Secondary | ICD-10-CM

## 2020-10-21 DIAGNOSIS — R7881 Bacteremia: Secondary | ICD-10-CM

## 2020-10-21 DIAGNOSIS — M009 Pyogenic arthritis, unspecified: Secondary | ICD-10-CM

## 2020-10-21 DIAGNOSIS — B952 Enterococcus as the cause of diseases classified elsewhere: Secondary | ICD-10-CM

## 2020-10-21 DIAGNOSIS — M4646 Discitis, unspecified, lumbar region: Secondary | ICD-10-CM | POA: Diagnosis not present

## 2020-10-21 DIAGNOSIS — I10 Essential (primary) hypertension: Secondary | ICD-10-CM | POA: Diagnosis not present

## 2020-10-21 DIAGNOSIS — M4620 Osteomyelitis of vertebra, site unspecified: Secondary | ICD-10-CM | POA: Diagnosis not present

## 2020-10-21 MED ORDER — AMOXICILLIN 500 MG PO TABS
500.0000 mg | ORAL_TABLET | Freq: Two times a day (BID) | ORAL | 5 refills | Status: DC
Start: 2020-10-21 — End: 2020-11-25

## 2020-10-21 NOTE — Assessment & Plan Note (Signed)
I strongly suspect that his septic arthritis has been cured through a combination of lengthy antibiotic therapy and incision and drainage of both knees.  He is still reluctant to discontinue amoxicillin but says that he will continue the near future.

## 2020-10-21 NOTE — Progress Notes (Signed)
[facility] for Infectious Disease  Patient Active Problem List   Diagnosis Date Noted  . Lumbar discitis 05/04/2020    Priority: High  . Prosthetic valve endocarditis ([place]) 04/13/2020    Priority: High  . Septic arthritis of knee, bilateral ([place]) 03/23/2020    Priority: High  . Bacteremia due to Enterococcus 03/21/2020    Priority: High  . S/P AVR 01/17/2020    Priority: High  . History of fusion of cervical spine 03/23/2020    Priority: Medium  . Drug-induced hypotension   . Hypotension due to drugs   . Hypokalemia   . Hypotension due to hypovolemia   . Anasarca   . History of prolonged Q-T interval on ECG   . Peripheral edema   . Labile blood glucose   . Atrial fibrillation with RVR ([place])   . Postoperative pain   . Transaminitis   . Hypoalbuminemia due to protein-calorie malnutrition ([place])   . Drug induced constipation   . Controlled type 2 diabetes mellitus with hyperglycemia, with long-term current use of insulin ([place])   . AKI (acute kidney injury) ([place])   . Pressure injury of skin 05/14/2020  . Debility 05/14/2020  . Acute blood loss anemia 05/02/2020  . Prolonged QT interval 05/02/2020  . Transient hypotension 05/02/2020  . Persistent atrial fibrillation ([place])   . Aortic valve abscess 04/13/2020  . Endocarditis 04/13/2020  . Typical atrial flutter ([place])   . Discitis 04/07/2020  . Moderate protein-calorie malnutrition ([place]) 03/24/2020  . Pseudogout of left knee 03/21/2020  . ARF (acute renal failure) ([place]) 03/20/2020  . Renal insufficiency 01/17/2020  . Pre-operative cardiovascular examination 01/03/2020  . Acute otitis externa of left ear 11/13/2019  . Acute suppurative otitis media of left ear without spontaneous rupture of tympanic membrane 11/13/2019  . Conductive hearing loss of left ear 11/13/2019  . Tinnitus of left ear 11/13/2019  . Lumbar stenosis with neurogenic claudication 06/11/2019  . Cervical myelopathy ([place]) 04/22/2019  .  Preoperative cardiovascular examination 04/17/2019  . Intermittent claudication ([place]) 10/31/2018  . Coronary artery disease of native artery of native heart with stable angina pectoris ([place])   . Abnormal nuclear stress test 05/03/2018  . Aortic stenosis 05/03/2018  . Acid reflux 02/15/2018  . Arthritis 02/15/2018  . Hyperactive gag reflex 02/15/2018  . Macular degeneration 02/15/2018  . Abnormal LFTs (liver function tests) 10/31/2016  . Coronary artery disease involving native coronary artery of native heart without angina pectoris 05/30/2016  . Diabetes mellitus due to underlying condition with unspecified complications ([place]) 61/60/7371  . Dyslipidemia 05/30/2016  . Essential hypertension 05/30/2016  . PAF (paroxysmal atrial fibrillation) ([place]) 05/30/2016    Patient's Medications  New Prescriptions   No medications on file  Previous Medications   AMIODARONE (PACERONE) 200 MG TABLET    Take 100 mg by mouth 2 (two) times daily.   AMLODIPINE (NORVASC) 5 MG TABLET    Take 5 mg by mouth daily.   EMPAGLIFLOZIN (JARDIANCE PO)    Take 10 mg by mouth daily. Unsure of dose    EZETIMIBE (ZETIA) 10 MG TABLET    TAKE 1 TABLET BY MOUTH EVERYDAY AT BEDTIME   FERROUS SULFATE 325 (65 FE) MG TABLET    Take 1 tablet (325 mg total) by mouth 2 (two) times daily with a meal.   LEVOTHYROXINE (SYNTHROID) 25 MCG TABLET    Take 1 tablet (25 mcg total) by mouth daily before breakfast.   METOPROLOL TARTRATE (LOPRESSOR)  25 MG TABLET    Take 12.5 mg by mouth 2 (two) times daily.   POTASSIUM CHLORIDE (KLOR-CON) 20 MEQ PACKET    Take 20 mEq by mouth daily.   SENNA (SENOKOT) 8.6 MG TABS TABLET    Take 1 tablet (8.6 mg total) by mouth at bedtime.   VITAMIN B-12 (CYANOCOBALAMIN) 1000 MCG TABLET    Take 1,000 mcg by mouth daily.   VITAMIN C (ASCORBIC ACID) 500 MG TABLET    Take 500 mg by mouth daily.    ZINC GLUCONATE 50 MG TABLET    Take 50 mg by mouth daily.  Modified Medications   Modified Medication Previous  Medication   AMOXICILLIN (AMOXIL) 500 MG TABLET amoxicillin (AMOXIL) 500 MG tablet      Take 1 tablet (500 mg total) by mouth 2 (two) times daily.    Take 500 mg by mouth 2 (two) times daily.  Discontinued Medications   No medications on file    Subjective: Johnathan Murray is in for his routine follow-up visit.  He developed enterococcal bacteremia and was hospitalized on 03/20/2020 with widely disseminated infection.  He previously had prosthetic aortic valve surgery and developed prosthetic valve endocarditis and an aortic root abscess, bilateral knee septic arthritis and lumbar infection.  He underwent redo aortic root implant and knee surgery.  He completed a lengthy course of IV ceftriaxone and ampicillin 05/20/2020 before converting to oral amoxicillin.    He is still having back and knee pain but overall he is feeling better.  He has been able to be more active.  He no longer needs his walker.  He now only needs one cane to ambulate.  He has not had any fever, nausea, vomiting or diarrhea.  He is tolerating amoxicillin well.  He has received his initial 2 doses of the Moderna Covid vaccine.  He and his wife are planning on going to cruise [place] next week.  Review of Systems: Review of Systems  Constitutional: Negative for chills, diaphoresis, fever, malaise/fatigue and weight loss.  HENT: Negative for congestion and sore throat.   Respiratory: Negative for cough and shortness of breath.   Cardiovascular: Negative for chest pain.  Gastrointestinal: Negative for abdominal pain, constipation, diarrhea, nausea and vomiting.  Genitourinary: Negative for dysuria.  Musculoskeletal: Positive for back pain and joint pain.  Skin: Negative for rash.  Neurological: Negative for headaches.    Past Medical History:  Diagnosis Date  . Abnormal LFTs (liver function tests) 10/31/2016  . Abnormal nuclear stress test 05/03/2018  . Acid reflux 02/15/2018   not at present  . Acute otitis externa  of left ear 11/13/2019  . Acute suppurative otitis media of left ear without spontaneous rupture of tympanic membrane 11/13/2019  . Anemia    low iron  . Aortic stenosis 05/03/2018  . Aortic valve abscess 04/13/2020  . ARF (acute renal failure) ([place]) 03/20/2020  . Arthritis 02/15/2018   Overview:  shoulder  . Bacteremia due to Enterococcus 03/21/2020  . Cervical myelopathy ([place]) 04/22/2019  . Conductive hearing loss of left ear 11/13/2019  . Coronary artery disease involving native coronary artery of native heart without angina pectoris 05/30/2016  . Coronary artery disease of native artery of native heart with stable angina pectoris ([place])    pt. denies  . Diabetes mellitus due to underlying condition with unspecified complications ([place]) 08/06/3845  . Discitis 04/07/2020  . Dyslipidemia 05/30/2016  . Dyspnea    upon exertion,  as of 06/05/09 not having  any recent chest pain  . Dysrhythmia    a fib  . Endocarditis 04/13/2020  . Essential hypertension 05/30/2016  . Heart murmur    Has had an Aortic valve replacement  . History of fusion of cervical spine 03/23/2020  . Hyperactive gag reflex 02/15/2018  . Hypothyroidism   . Intermittent claudication ([place]) 10/31/2018  . Lumbar stenosis with neurogenic claudication 06/11/2019  . Macular degeneration 02/15/2018  . PAF (paroxysmal atrial fibrillation) ([place]) 05/30/2016  . Pre-operative cardiovascular examination 01/03/2020  . Preoperative cardiovascular examination 04/17/2019  . Prosthetic valve endocarditis ([place]) 04/13/2020  . Protein-calorie malnutrition, severe 03/24/2020  . Pseudogout of left knee 03/21/2020  . Renal insufficiency 01/17/2020  . S/P aortic valve replacement 01/17/2020  . S/P AVR 01/17/2020  . Septic arthritis ([place]) 03/23/2020  . Tinnitus of left ear 11/13/2019  . Typical atrial flutter ([facility])     Social History   Tobacco Use  . Smoking status: Never Smoker  . Smokeless tobacco: Never Used  Vaping Use  . Vaping Use: Never used   Substance Use Topics  . Alcohol use: Not Currently  . Drug use: Never    Family History  Problem Relation Age of Onset  . Prostate cancer Brother   . Heart disease Maternal Grandmother   . Brain cancer Mother     Allergies  Allergen Reactions  . Atorvastatin Other (See Comments)    Urinary retention   . Nebivolol Diarrhea    Objective: Vitals:   10/21/20 1108  BP: (!) 180/84  Pulse: (!) 50  Temp: 98 F (36.7 C)  Weight: 186 lb (84.4 kg)  Height: 5\' 8"  (1.727 m)   Body mass index is 28.28 kg/m.  Physical Exam Constitutional:      Comments: He is in good spirits.  Cardiovascular:     Rate and Rhythm: Normal rate and regular rhythm.     Heart sounds: Murmur heard.      Comments: He has a 1/6 systolic murmur. Pulmonary:     Effort: Pulmonary effort is normal.     Breath sounds: Normal breath sounds.  Abdominal:     Palpations: Abdomen is soft.     Tenderness: There is no abdominal tenderness.  Musculoskeletal:        General: Tenderness present. No swelling.     Comments: His knee incisions are nicely healed.  There is no unusual swelling, redness or warmth.  Skin:    Findings: No rash.  Neurological:     General: No focal deficit present.  Psychiatric:        Mood and Affect: Mood normal.     Lab Results Sed Rate  Date Value  09/09/2020 2 mm/h  06/17/2020 19 mm/h  05/05/2020 30 mm/hr (H)   CRP (mg/L)  Date Value  09/09/2020 5.1  07/29/2020 12.2 (H)  06/17/2020 40.8 (H)   Lumbar MRI 10/07/2020  IMPRESSION: Progression of multilevel degenerative changes and sequelae of discitis/osteomyelitis at L4-L5. Most notably, there is increased marked canal stenosis at L4-L5 with crowding of the cauda equina. Increased marked bilateral foraminal stenosis at this level. There is also increased marked canal stenosis at L3-L4. Similar moderate to marked right foraminal stenosis at L5-S1.  These results will be called to the ordering clinician  or representative by the Radiologist Assistant, and communication documented in the PACS or [employer].    By: Macy Mis M.D.   On: 10/08/2020 10:30 Problem List Items Addressed This Visit      High  Bacteremia due to Enterococcus    His bacteremia has been cured.      Septic arthritis of knee, bilateral ([place])    I strongly suspect that his septic arthritis has been cured through a combination of lengthy antibiotic therapy and incision and drainage of both knees.  He is still reluctant to discontinue amoxicillin but says that he will continue the near future.      Prosthetic valve endocarditis ([place])    I strongly suspect that his prosthetic valve endocarditis has been cured.  He would like to continue amoxicillin for now and follow-up in 1 month to consider stopping amoxicillin.      Lumbar discitis    I suspect that his discitis has been cured.  His inflammatory markers have normalized and his MRI shows "sequelae" of infection.  He would like to continue his amoxicillin through his upcoming cruise and then follow-up in 1 month to consider stopping it.      Relevant Orders   CBC   Basic metabolic panel   C-reactive protein   Sedimentation rate       Michel Bickers, MD [facility] for Infectious [place] [phone] pager   [phone] cell 10/21/2020, 12:13 PM

## 2020-10-21 NOTE — Assessment & Plan Note (Signed)
His bacteremia has been cured. ?

## 2020-10-21 NOTE — Assessment & Plan Note (Signed)
I strongly suspect that his prosthetic valve endocarditis has been cured.  He would like to continue amoxicillin for now and follow-up in 1 month to consider stopping amoxicillin.

## 2020-10-21 NOTE — Assessment & Plan Note (Signed)
I suspect that his discitis has been cured.  His inflammatory markers have normalized and his MRI shows "sequelae" of infection.  He would like to continue his amoxicillin through his upcoming cruise and then follow-up in 1 month to consider stopping it.

## 2020-10-22 LAB — CBC
HCT: 43.1 % (ref 38.5–50.0)
Hemoglobin: 14.7 g/dL (ref 13.2–17.1)
MCH: 31.3 pg (ref 27.0–33.0)
MCHC: 34.1 g/dL (ref 32.0–36.0)
MCV: 91.9 fL (ref 80.0–100.0)
MPV: 11.4 fL (ref 7.5–12.5)
Platelets: 185 Thousand/uL (ref 140–400)
RBC: 4.69 Million/uL (ref 4.20–5.80)
RDW: 15.9 % — ABNORMAL HIGH (ref 11.0–15.0)
WBC: 5.7 Thousand/uL (ref 3.8–10.8)

## 2020-10-22 LAB — BASIC METABOLIC PANEL
BUN/Creatinine Ratio: 22 (calc) (ref 6–22)
BUN: 38 mg/dL — ABNORMAL HIGH (ref 7–25)
CO2: 26 mmol/L (ref 20–32)
Calcium: 10 mg/dL (ref 8.6–10.3)
Chloride: 102 mmol/L (ref 98–110)
Creat: 1.75 mg/dL — ABNORMAL HIGH (ref 0.70–1.25)
Glucose, Bld: 138 mg/dL — ABNORMAL HIGH (ref 65–99)
Potassium: 4.2 mmol/L (ref 3.5–5.3)
Sodium: 140 mmol/L (ref 135–146)

## 2020-10-22 LAB — C-REACTIVE PROTEIN: CRP: 4.1 mg/L (ref <8.0)

## 2020-10-22 LAB — SEDIMENTATION RATE: Sed Rate: 9 mm/h (ref 0–20)

## 2020-10-29 DIAGNOSIS — Z20828 Contact with and (suspected) exposure to other viral communicable diseases: Secondary | ICD-10-CM | POA: Diagnosis not present

## 2020-11-25 ENCOUNTER — Other Ambulatory Visit: Payer: Self-pay

## 2020-11-25 ENCOUNTER — Ambulatory Visit: Payer: BC Managed Care – PPO | Admitting: Internal Medicine

## 2020-11-25 DIAGNOSIS — M4646 Discitis, unspecified, lumbar region: Secondary | ICD-10-CM | POA: Diagnosis not present

## 2020-11-25 MED ORDER — AMOXICILLIN 500 MG PO TABS: 500.0000 mg | ORAL_TABLET | Freq: Two times a day (BID) | ORAL | 5 refills | Status: AC

## 2020-11-25 NOTE — Assessment & Plan Note (Signed)
He is doing much better and his inflammatory markers have now been consistently normal.  I believe that his disseminated enterococcal infection has been cured.  He remains reluctant about stopping antibiotics.  I was able to negotiate with having him complete final 10 to 14 days of his current supply then stop amoxicillin.  He will follow-up here in 6 weeks.

## 2020-11-25 NOTE — Progress Notes (Signed)
[facility] for Infectious Disease  Patient Active Problem List   Diagnosis Date Noted  . Lumbar discitis 05/04/2020    Priority: High  . Prosthetic valve endocarditis ([place]) 04/13/2020    Priority: High  . Septic arthritis of knee, bilateral ([place]) 03/23/2020    Priority: High  . Bacteremia due to Enterococcus 03/21/2020    Priority: High  . S/P AVR 01/17/2020    Priority: High  . History of fusion of cervical spine 03/23/2020    Priority: Medium  . Drug-induced hypotension   . Hypotension due to drugs   . Hypokalemia   . Hypotension due to hypovolemia   . Anasarca   . History of prolonged Q-T interval on ECG   . Peripheral edema   . Labile blood glucose   . Atrial fibrillation with RVR ([place])   . Postoperative pain   . Transaminitis   . Hypoalbuminemia due to protein-calorie malnutrition ([place])   . Drug induced constipation   . Controlled type 2 diabetes mellitus with hyperglycemia, with long-term current use of insulin ([place])   . AKI (acute kidney injury) ([place])   . Pressure injury of skin 05/14/2020  . Debility 05/14/2020  . Acute blood loss anemia 05/02/2020  . Prolonged QT interval 05/02/2020  . Transient hypotension 05/02/2020  . Persistent atrial fibrillation ([place])   . Aortic valve abscess 04/13/2020  . Endocarditis 04/13/2020  . Typical atrial flutter ([place])   . Discitis 04/07/2020  . Moderate protein-calorie malnutrition ([place]) 03/24/2020  . Pseudogout of left knee 03/21/2020  . ARF (acute renal failure) ([place]) 03/20/2020  . Renal insufficiency 01/17/2020  . Pre-operative cardiovascular examination 01/03/2020  . Acute otitis externa of left ear 11/13/2019  . Acute suppurative otitis media of left ear without spontaneous rupture of tympanic membrane 11/13/2019  . Conductive hearing loss of left ear 11/13/2019  . Tinnitus of left ear 11/13/2019  . Lumbar stenosis with neurogenic claudication 06/11/2019  . Cervical myelopathy ([place]) 04/22/2019  .  Preoperative cardiovascular examination 04/17/2019  . Intermittent claudication ([place]) 10/31/2018  . Coronary artery disease of native artery of native heart with stable angina pectoris ([place])   . Abnormal nuclear stress test 05/03/2018  . Aortic stenosis 05/03/2018  . Acid reflux 02/15/2018  . Arthritis 02/15/2018  . Hyperactive gag reflex 02/15/2018  . Macular degeneration 02/15/2018  . Abnormal LFTs (liver function tests) 10/31/2016  . Coronary artery disease involving native coronary artery of native heart without angina pectoris 05/30/2016  . Diabetes mellitus due to underlying condition with unspecified complications ([place]) [id number]  . Dyslipidemia 05/30/2016  . Essential hypertension 05/30/2016  . PAF (paroxysmal atrial fibrillation) ([place]) 05/30/2016    Patient's Medications  New Prescriptions   No medications on file  Previous Medications   AMIODARONE (PACERONE) 200 MG TABLET    Take 100 mg by mouth 2 (two) times daily.   AMLODIPINE (NORVASC) 5 MG TABLET    Take 5 mg by mouth daily.   ELIQUIS 5 MG TABS TABLET    Take 5 mg by mouth 2 (two) times daily.   EMPAGLIFLOZIN (JARDIANCE PO)    Take 10 mg by mouth daily. Unsure of dose    EZETIMIBE (ZETIA) 10 MG TABLET    TAKE 1 TABLET BY MOUTH EVERYDAY AT BEDTIME   FERROUS SULFATE 325 (65 FE) MG TABLET    Take 1 tablet (325 mg total) by mouth 2 (two) times daily with a meal.   LEVOTHYROXINE (SYNTHROID) 25 MCG TABLET  Take 1 tablet (25 mcg total) by mouth daily before breakfast.   METOPROLOL TARTRATE (LOPRESSOR) 25 MG TABLET    Take 12.5 mg by mouth 2 (two) times daily.   POTASSIUM CHLORIDE (KLOR-CON) 20 MEQ PACKET    Take 20 mEq by mouth daily.   PREGABALIN (LYRICA) 100 MG CAPSULE    Take 100 mg by mouth 3 (three) times daily.   SENNA (SENOKOT) 8.6 MG TABS TABLET    Take 1 tablet (8.6 mg total) by mouth at bedtime.   VITAMIN B-12 (CYANOCOBALAMIN) 1000 MCG TABLET    Take 1,000 mcg by mouth daily.   VITAMIN C (ASCORBIC ACID) 500 MG  TABLET    Take 500 mg by mouth daily.    ZINC GLUCONATE 50 MG TABLET    Take 50 mg by mouth daily.  Modified Medications   Modified Medication Previous Medication   AMOXICILLIN (AMOXIL) 500 MG TABLET amoxicillin (AMOXIL) 500 MG tablet      Take 1 tablet (500 mg total) by mouth 2 (two) times daily for 9 days.    Take 1 tablet (500 mg total) by mouth 2 (two) times daily.  Discontinued Medications   No medications on file    Subjective: Johnathan Murray is in for his routine follow-up visit.  He developed enterococcal bacteremia and was hospitalized on 03/20/2020 with widely disseminated infection.  He previously had prosthetic aortic valve surgery and developed prosthetic valve endocarditis and an aortic root abscess, bilateral knee septic arthritis and lumbar infection.  He underwent redo aortic root implant and knee surgery.  He completed a lengthy course of IV ceftriaxone and ampicillin 05/20/2020 before converting to oral amoxicillin.    He is still having back and knee pain but overall he is feeling better.  He has been able to be more active.  He no longer needs his walker.  He now only needs one cane to ambulate.  He has not had any fever, nausea, vomiting or diarrhea.  He is tolerating amoxicillin well.   He and his wife enjoyed their cruise last month.  He feels like he is making slow progress.  He is still having some back pain that radiates into his left hip and some knee pain but overall this is improving.  He tells me that his neurosurgeon, [name], told him that 2 vertebrae in his lower spine are starting to fuse.  Review of Systems: Review of Systems  Constitutional: Negative for chills, diaphoresis, fever, malaise/fatigue and weight loss.  HENT: Negative for congestion and sore throat.   Respiratory: Negative for cough and shortness of breath.   Cardiovascular: Negative for chest pain.  Gastrointestinal: Negative for abdominal pain, constipation, diarrhea, nausea and vomiting.   Genitourinary: Negative for dysuria.  Musculoskeletal: Positive for back pain and joint pain.  Skin: Negative for rash.  Neurological: Negative for headaches.    Past Medical History:  Diagnosis Date  . Abnormal LFTs (liver function tests) 10/31/2016  . Abnormal nuclear stress test 05/03/2018  . Acid reflux 02/15/2018   not at present  . Acute otitis externa of left ear 11/13/2019  . Acute suppurative otitis media of left ear without spontaneous rupture of tympanic membrane 11/13/2019  . Anemia    low iron  . Aortic stenosis 05/03/2018  . Aortic valve abscess 04/13/2020  . ARF (acute renal failure) ([place]) 03/20/2020  . Arthritis 02/15/2018   Overview:  shoulder  . Bacteremia due to Enterococcus 03/21/2020  . Cervical myelopathy ([place]) 04/22/2019  . Conductive hearing loss  of left ear 11/13/2019  . Coronary artery disease involving native coronary artery of native heart without angina pectoris 05/30/2016  . Coronary artery disease of native artery of native heart with stable angina pectoris (HCC)    pt. denies  . Diabetes mellitus due to underlying condition with unspecified complications ([facility]CC) 05/30/2016  . Discitis 04/07/2020  . Dyslipidemia 05/30/2016  . Dyspnea    upon exertion,  as of 06/05/09 not having any recent chest pain  . Dysrhythmia    a fib  . Endocarditis 04/13/2020  . Essential hypertension 05/30/2016  . Heart murmur    Has had an Aortic valve replacement  . History of fusion of cervical spine 03/23/2020  . Hyperactive gag reflex 02/15/2018  . Hypothyroidism   . Intermittent claudication ([facility]CC) 10/31/2018  . Lumbar stenosis with neurogenic claudication 06/11/2019  . Macular degeneration 02/15/2018  . PAF (paroxysmal atrial fibrillation) (HCC) 05/30/2016  . Pre-operative cardiovascular examination 01/03/2020  . Preoperative cardiovascular examination 04/17/2019  . Prosthetic valve endocarditis ([facility]) 04/13/2020  . Protein-calorie malnutrition, severe 03/24/2020  . Pseudogout of left  knee 03/21/2020  . Renal insufficiency 01/17/2020  . S/P aortic valve replacement 01/17/2020  . S/P AVR 01/17/2020  . Septic arthritis (HCC) 03/23/2020  . Tinnitus of left ear 11/13/2019  . Typical atrial flutter ([facility])     Social History   Tobacco Use  . Smoking status: Never Smoker  . Smokeless tobacco: Never Used  Vaping Use  . Vaping Use: Never used  Substance Use Topics  . Alcohol use: Not Currently  . Drug use: Never    Family History  Problem Relation Age of Onset  . Prostate cancer Brother   . Heart disease Maternal Grandmother   . Brain cancer Mother     Allergies  Allergen Reactions  . Atorvastatin Other (See Comments)    Urinary retention   . Nebivolol Diarrhea    Objective: Vitals:   11/25/20 1056  BP: (!) 162/90  Pulse: (!) 48  Temp: 98 F (36.7 C)  TempSrc: Oral  Weight: 195 lb 3.2 oz (88.5 kg)   Body mass index is 29.68 kg/m.  Physical Exam Constitutional:      Comments: He is in good spirits.  He is accompanied by his wife.  He has lots of questions.  Cardiovascular:     Rate and Rhythm: Normal rate and regular rhythm.     Heart sounds: No murmur heard.     Comments: He has a 1/6 systolic murmur. Pulmonary:     Effort: Pulmonary effort is normal.     Breath sounds: Normal breath sounds.  Abdominal:     Palpations: Abdomen is soft.     Tenderness: There is no abdominal tenderness.  Musculoskeletal:        General: Tenderness present. No swelling.     Comments: His knee incisions are nicely healed.    Skin:    Findings: No rash.  Neurological:     General: No focal deficit present.  Psychiatric:        Mood and Affect: Mood normal.     Lab Results Sed Rate (mm/h)  Date Value  10/21/2020 9  09/09/2020 2  06/17/2020 19   CRP (mg/L)  Date Value  10/21/2020 4.1  09/09/2020 5.1  07/29/2020 12.2 (H)   Lumbar MRI 10/07/2020  IMPRESSION: Progression of multilevel degenerative changes and sequelae of discitis/osteomyelitis at  L4-L5. Most notably, there is increased marked canal stenosis at L4-L5 with crowding of the cauda equina.  Increased marked bilateral foraminal stenosis at this level. There is also increased marked canal stenosis at L3-L4. Similar moderate to marked right foraminal stenosis at L5-S1.  These results will be called to the ordering clinician or representative by the Radiologist Assistant, and communication documented in the PACS or [employer].    By: Macy Mis M.D.   On: 10/08/2020 10:30 Problem List Items Addressed This Visit      High   Lumbar discitis    He is doing much better and his inflammatory markers have now been consistently normal.  I believe that his disseminated enterococcal infection has been cured.  He remains reluctant about stopping antibiotics.  I was able to negotiate with having him complete final 10 to 14 days of his current supply then stop amoxicillin.  He will follow-up here in 6 weeks.          Michel Bickers, MD [facility] for [facility] [phone] pager   [phone] cell 11/25/2020, 11:37 AM

## 2020-11-28 DIAGNOSIS — E1129 Type 2 diabetes mellitus with other diabetic kidney complication: Secondary | ICD-10-CM | POA: Diagnosis not present

## 2020-11-28 DIAGNOSIS — I4891 Unspecified atrial fibrillation: Secondary | ICD-10-CM | POA: Diagnosis not present

## 2020-11-28 DIAGNOSIS — E291 Testicular hypofunction: Secondary | ICD-10-CM | POA: Diagnosis not present

## 2020-11-28 DIAGNOSIS — Z125 Encounter for screening for malignant neoplasm of prostate: Secondary | ICD-10-CM | POA: Diagnosis not present

## 2020-11-28 DIAGNOSIS — E785 Hyperlipidemia, unspecified: Secondary | ICD-10-CM | POA: Diagnosis not present

## 2020-11-28 DIAGNOSIS — E039 Hypothyroidism, unspecified: Secondary | ICD-10-CM | POA: Diagnosis not present

## 2020-11-28 DIAGNOSIS — D509 Iron deficiency anemia, unspecified: Secondary | ICD-10-CM | POA: Diagnosis not present

## 2020-11-28 DIAGNOSIS — R809 Proteinuria, unspecified: Secondary | ICD-10-CM | POA: Diagnosis not present

## 2020-12-01 ENCOUNTER — Other Ambulatory Visit: Payer: Self-pay

## 2020-12-01 DIAGNOSIS — R011 Cardiac murmur, unspecified: Secondary | ICD-10-CM | POA: Insufficient documentation

## 2020-12-01 DIAGNOSIS — I499 Cardiac arrhythmia, unspecified: Secondary | ICD-10-CM | POA: Insufficient documentation

## 2020-12-01 DIAGNOSIS — E039 Hypothyroidism, unspecified: Secondary | ICD-10-CM | POA: Insufficient documentation

## 2020-12-01 DIAGNOSIS — D649 Anemia, unspecified: Secondary | ICD-10-CM | POA: Insufficient documentation

## 2020-12-01 DIAGNOSIS — R0600 Dyspnea, unspecified: Secondary | ICD-10-CM | POA: Insufficient documentation

## 2020-12-02 DIAGNOSIS — I10 Essential (primary) hypertension: Secondary | ICD-10-CM | POA: Diagnosis not present

## 2020-12-02 DIAGNOSIS — M4620 Osteomyelitis of vertebra, site unspecified: Secondary | ICD-10-CM | POA: Diagnosis not present

## 2020-12-02 DIAGNOSIS — Z6826 Body mass index (BMI) 26.0-26.9, adult: Secondary | ICD-10-CM | POA: Diagnosis not present

## 2020-12-04 ENCOUNTER — Ambulatory Visit: Payer: BC Managed Care – PPO | Admitting: Cardiology

## 2020-12-09 ENCOUNTER — Other Ambulatory Visit: Payer: Self-pay | Admitting: Neurosurgery

## 2020-12-09 DIAGNOSIS — M4620 Osteomyelitis of vertebra, site unspecified: Secondary | ICD-10-CM

## 2020-12-10 ENCOUNTER — Telehealth: Payer: Self-pay

## 2020-12-10 ENCOUNTER — Ambulatory Visit: Payer: BC Managed Care – PPO | Admitting: Cardiology

## 2020-12-10 ENCOUNTER — Other Ambulatory Visit: Payer: Self-pay

## 2020-12-10 ENCOUNTER — Encounter: Payer: Self-pay | Admitting: Cardiology

## 2020-12-10 VITALS — BP 132/72 | HR 51 | Ht 70.0 in | Wt 198.8 lb

## 2020-12-10 DIAGNOSIS — E785 Hyperlipidemia, unspecified: Secondary | ICD-10-CM

## 2020-12-10 DIAGNOSIS — N289 Disorder of kidney and ureter, unspecified: Secondary | ICD-10-CM

## 2020-12-10 DIAGNOSIS — I480 Paroxysmal atrial fibrillation: Secondary | ICD-10-CM

## 2020-12-10 DIAGNOSIS — I10 Essential (primary) hypertension: Secondary | ICD-10-CM

## 2020-12-10 DIAGNOSIS — I2510 Atherosclerotic heart disease of native coronary artery without angina pectoris: Secondary | ICD-10-CM | POA: Diagnosis not present

## 2020-12-10 DIAGNOSIS — Z789 Other specified health status: Secondary | ICD-10-CM

## 2020-12-10 DIAGNOSIS — Z952 Presence of prosthetic heart valve: Secondary | ICD-10-CM

## 2020-12-10 NOTE — Progress Notes (Signed)
Cardiology Office Note:    Date:  12/10/2020   ID:  Johnathan Murray, DOB [birth year], MRN [id number]  PCP:  Johnathan Dress, MD  Cardiologist:  Johnathan Lindau, MD   Referring MD: Johnathan Dress, MD    ASSESSMENT:    1. Coronary artery disease involving native coronary artery of native heart without angina pectoris   2. Essential hypertension   3. PAF (paroxysmal atrial fibrillation) ([place])   4. S/P AVR   5. Renal insufficiency    PLAN:    In order of problems listed above:  1. Coronary artery disease: Secondary prevention stressed with patient.  Importance of compliance with diet medication stressed any vocalized understanding.  Patient has gained significant amount of weight and has been noncompliant with his diet in the past several weeks.  I cautioned him against this.  Exercise was stressed. 2. Essential hypertension: Blood pressure stable and diet was emphasized. 3. Paroxysmal atrial fibrillation:I discussed with the patient atrial fibrillation, disease process. Management and therapy including rate and rhythm control, anticoagulation benefits and potential risks were discussed extensively with the patient. Patient had multiple questions which were answered to patient's satisfaction.  I had a lengthy conversation with the patient about this.  He is in sinus rhythm.  I wanted to cut down his amiodarone 200 a day and he is agreeable.  He was hesitant in the beginning.  But I told him about the risks and benefits and he understands. 4. Mixed dyslipidemia: Lipids are markedly elevated.  His LDL is 208 and I cautioned him about compliance with diet and he promises to do better.  I will initiate him on rosuvastatin 20 mg daily and will get him back for liver lipid check in 6 weeks.  Before that I want to make sure that his recent blood work also included liver panel and I am trying to obtain that report from primary care physician 5. Diabetes mellitus: Hemoglobin A1c greater than  7 and again I questioned the patient about diet and exercise and losing weight and he promises to do better. 6. Patient will be seen in follow-up appointment in 2 months or earlier if the patient has any concerns    Medication Adjustments/Labs and Tests Ordered: Current medicines are reviewed at length with the patient today.  Concerns regarding medicines are outlined above.  No orders of the defined types were placed in this encounter.  No orders of the defined types were placed in this encounter.    No chief complaint on file.    History of Present Illness:    Johnathan Murray is a 70 y.o. male.  Patient has past medical history of coronary artery disease, essential hypertension dyslipidemia and diabetes mellitus.  He has paroxysmal atrial fibrillation.  He denies any problems at this time and takes care of activities of daily living.  No chest pain orthopnea or PND.  At the time of my evaluation, the patient is alert awake oriented and in no distress.  Past Medical History:  Diagnosis Date  . Abnormal LFTs (liver function tests) 10/31/2016  . Abnormal nuclear stress test 05/03/2018  . Acid reflux 02/15/2018   not at present  . Acute blood loss anemia 05/02/2020  . Acute otitis externa of left ear 11/13/2019  . Acute suppurative otitis media of left ear without spontaneous rupture of tympanic membrane 11/13/2019  . AKI (acute kidney injury) ([place])   . Anasarca   . Anemia    low iron  .  Aortic stenosis 05/03/2018  . Aortic valve abscess 04/13/2020  . ARF (acute renal failure) ([place]) 03/20/2020  . Arthritis 02/15/2018   Overview:  shoulder  . Atrial fibrillation with RVR ([place])   . Bacteremia due to Enterococcus 03/21/2020  . Cervical myelopathy ([place]) 04/22/2019  . Conductive hearing loss of left ear 11/13/2019  . Controlled type 2 diabetes mellitus with hyperglycemia, with long-term current use of insulin ([place])   . Coronary artery disease involving native coronary artery of native  heart without angina pectoris 05/30/2016  . Coronary artery disease of native artery of native heart with stable angina pectoris ([place])    pt. denies  . Debility 05/14/2020  . Diabetes mellitus due to underlying condition with unspecified complications ([place]) 0/62/3762  . Discitis 04/07/2020  . Drug induced constipation   . Drug-induced hypotension   . Dyslipidemia 05/30/2016  . Dyspnea    upon exertion,  as of 06/05/09 not having any recent chest pain  . Dysrhythmia    a fib  . Endocarditis 04/13/2020  . Essential hypertension 05/30/2016  . Heart murmur    Has had an Aortic valve replacement  . History of fusion of cervical spine 03/23/2020  . History of prolonged Q-T interval on ECG   . Hyperactive gag reflex 02/15/2018  . Hypoalbuminemia due to protein-calorie malnutrition ([place])   . Hypokalemia   . Hypotension due to drugs   . Hypotension due to hypovolemia   . Hypothyroidism   . Intermittent claudication ([place]) 10/31/2018  . Labile blood glucose   . Lumbar discitis 05/04/2020  . Lumbar stenosis with neurogenic claudication 06/11/2019  . Macular degeneration 02/15/2018  . Moderate protein-calorie malnutrition ([place]) 03/24/2020  . PAF (paroxysmal atrial fibrillation) ([place]) 05/30/2016  . Peripheral edema   . Persistent atrial fibrillation ([place])   . Postoperative pain   . Pre-operative cardiovascular examination 01/03/2020  . Preoperative cardiovascular examination 04/17/2019  . Pressure injury of skin 05/14/2020  . Prolonged QT interval 05/02/2020  . Prosthetic valve endocarditis ([place]) 04/13/2020  . Protein-calorie malnutrition, severe 03/24/2020  . Pseudogout of left knee 03/21/2020  . Renal insufficiency 01/17/2020  . S/P aortic valve replacement 01/17/2020  . S/P AVR 01/17/2020  . Septic arthritis ([place]) 03/23/2020  . Septic arthritis of knee, bilateral ([place]) 03/23/2020  . Tinnitus of left ear 11/13/2019  . Transaminitis   . Transient hypotension 05/02/2020  . Typical atrial flutter [facility])     Past  Surgical History:  Procedure Laterality Date  . ANTERIOR CERVICAL DECOMP/DISCECTOMY FUSION N/A 04/22/2019   Procedure: Anterior Cervical Discectomy and Fusion, Cervical four-five, Cervical five-six;  Surgeon: Earnie Larsson, MD;  Location: [place];  Service: Neurosurgery;  Laterality: N/A;  . ANTERIOR CERVICAL DECOMP/DISCECTOMY FUSION N/A 03/02/2020   Procedure: Anterior Cervical Decompression Fusion  - Cervical three-Cervical four;  Surgeon: Earnie Larsson, MD;  Location: [place];  Service: Neurosurgery;  Laterality: N/A;  . AORTIC VALVE REPLACEMENT    . ASCENDING AORTIC ROOT REPLACEMENT N/A 04/16/2020   Procedure: ASCENDING AORTIC ROOT REPLACEMENT - HOMOGRAFT using [address] 23 MM Cryo Aortic Valve.;  Surgeon: Gaye Pollack, MD;  Location: [facility];  Service: Open Heart Surgery;  Laterality: N/A;  . CARDIAC CATHETERIZATION     several stents  . CARDIAC SURGERY    . CARDIOVERSION N/A 04/13/2020   Procedure: CARDIOVERSION;  Surgeon: Geralynn Rile, MD;  Location: [place];  Service: Cardiovascular;  Laterality: N/A;  . CARDIOVERSION N/A 04/29/2020   Procedure: CARDIOVERSION;  Surgeon: Skeet Latch, MD;  Location: [place] [facility];  Service: Cardiovascular;  Laterality: N/A;  . CORONARY ARTERY BYPASS GRAFT  2017   patient unsure how many, had at [facility]  . HAND SURGERY    . I & D EXTREMITY Bilateral 05/02/2020   Procedure: IRRIGATION AND DEBRIDEMENT BILATERAL KNEES;  Surgeon: Newt Minion, MD;  Location: [place];  Service: Orthopedics;  Laterality: Bilateral;  . IR LUMBAR DISC ASPIRATION W/IMG GUIDE  04/07/2020  . IRRIGATION AND DEBRIDEMENT KNEE Bilateral 03/24/2020   Procedure: ARTHROSCOPY IRRIGATION AND DEBRIDEMENT BILATERAL  KNEE;  Surgeon: Marchia Bond, MD;  Location: [place];  Service: Orthopedics;  Laterality: Bilateral;  . KNEE SURGERY    . LEFT HEART CATH AND CORONARY ANGIOGRAPHY N/A 01/21/2020   Procedure: LEFT HEART CATH AND CORONARY ANGIOGRAPHY;  Surgeon: Belva Crome, MD;  Location: [place] [facility];  Service: Cardiovascular;  Laterality: N/A;  . LEFT HEART CATH AND CORS/GRAFTS ANGIOGRAPHY N/A 05/16/2018   Procedure: LEFT HEART CATH AND CORS/GRAFTS ANGIOGRAPHY;  Surgeon: Burnell Blanks, MD;  Location: [place] [facility];  Service: Cardiovascular;  Laterality: N/A;  . LUMBAR LAMINECTOMY/DECOMPRESSION MICRODISCECTOMY Bilateral 06/11/2019   Procedure: Bilateral Lumbar Four-FiveLaminectomy/Foraminotomy;  Surgeon: Earnie Larsson, MD;  Location: [place];  Service: Neurosurgery;  Laterality: Bilateral;  posterior  . SYNOVECTOMY Bilateral 05/02/2020   Procedure: SYNOVECTOMY;  Surgeon: Newt Minion, MD;  Location: [place];  Service: Orthopedics;  Laterality: Bilateral;  . TEE WITHOUT CARDIOVERSION N/A 03/26/2020   Procedure: TRANSESOPHAGEAL ECHOCARDIOGRAM (TEE);  Surgeon: Jerline Pain, MD;  Location: [facility];  Service: Cardiovascular;  Laterality: N/A;  . TEE WITHOUT CARDIOVERSION N/A 04/13/2020   Procedure: TRANSESOPHAGEAL ECHOCARDIOGRAM (TEE);  Surgeon: Geralynn Rile, MD;  Location: [place];  Service: Cardiovascular;  Laterality: N/A;  . TEE WITHOUT CARDIOVERSION N/A 04/29/2020   Procedure: TRANSESOPHAGEAL ECHOCARDIOGRAM (TEE);  Surgeon: Skeet Latch, MD;  Location: [place];  Service: Cardiovascular;  Laterality: N/A;  . TEE WITHOUT CARDIOVERSION N/A 04/16/2020   Procedure: TRANSESOPHAGEAL ECHOCARDIOGRAM (TEE);  Surgeon: Gaye Pollack, MD;  Location: [place];  Service: Open Heart Surgery;  Laterality: N/A;    Current Medications: Current Meds  Medication Sig  . amiodarone (PACERONE) 200 MG tablet Take 100 mg by mouth 2 (two) times daily.  Marland Kitchen doxazosin (CARDURA) 1 MG tablet Take 1 mg by mouth daily.  Marland Kitchen ELIQUIS 5 MG TABS tablet Take 5 mg by mouth 2 (two) times daily.  . Empagliflozin (JARDIANCE PO) Take 10 mg by mouth daily. Unsure of dose  . ezetimibe (ZETIA) 10 MG tablet TAKE 1 TABLET BY MOUTH EVERYDAY AT BEDTIME  . ferrous sulfate  325 (65 FE) MG tablet Take 1 tablet (325 mg total) by mouth 2 (two) times daily with a meal.  . furosemide (LASIX) 20 MG tablet Take 20 mg by mouth 2 (two) times daily.  . INVOKANA 300 MG TABS tablet Take 300 mg by mouth daily.  Marland Kitchen levothyroxine (SYNTHROID) 25 MCG tablet Take 1 tablet (25 mcg total) by mouth daily before breakfast.  . metoprolol tartrate (LOPRESSOR) 25 MG tablet Take 12.5 mg by mouth 2 (two) times daily.  . potassium chloride (KLOR-CON) 20 MEQ packet Take 20 mEq by mouth daily.  . pregabalin (LYRICA) 100 MG capsule Take 100 mg by mouth 3 (three) times daily.  Marland Kitchen senna (SENOKOT) 8.6 MG TABS tablet Take 1 tablet (8.6 mg total) by mouth at bedtime.  . vitamin B-12 (CYANOCOBALAMIN) 1000 MCG tablet Take 1,000 mcg by mouth daily.  Marland Kitchen  vitamin C (ASCORBIC ACID) 500 MG tablet Take 500 mg by mouth daily.   Marland Kitchen zinc gluconate 50 MG tablet Take 50 mg by mouth daily.     Allergies:   Atorvastatin and Nebivolol   Social History   Socioeconomic History  . Marital status: Married    Spouse name: [name]  . Number of children: 1  . Years of education: Not on file  . Highest education level: GED or equivalent  Occupational History    Employer: [employer]  Tobacco Use  . Smoking status: Never Smoker  . Smokeless tobacco: Never Used  Vaping Use  . Vaping Use: Never used  Substance and Sexual Activity  . Alcohol use: Not Currently  . Drug use: Never  . Sexual activity: Not Currently  Other Topics Concern  . Not on file  Social History Narrative   He drives a truck for [employer].   He lives with wife in a one-level home.   Highest level of education:  GED      Patient is right-handed. There are 8 steps to enter his home.   Social Determinants of Health   Financial Resource Strain: Not on file  Food Insecurity: Not on file  Transportation Needs: Not on file  Physical Activity: Not on file  Stress: Not on file  Social Connections: Not on file     Family  History: The patient's family history includes Brain cancer in his mother; Heart disease in his maternal grandmother; Prostate cancer in his brother.  ROS:   Please see the history of present illness.    All other systems reviewed and are negative.  EKGs/Labs/Other Studies Reviewed:    The following studies were reviewed today: EKG done today revealed sinus bradycardia and nonspecific ST-T changes   Recent Labs: 05/14/2020: Magnesium 2.2 05/18/2020: B Natriuretic Peptide 134.7 06/24/2020: TSH 6.720 07/28/2020: ALT 25 10/21/2020: BUN 38; Creat 1.75; Hemoglobin 14.7; Platelets 185; Potassium 4.2; Sodium 140  Recent Lipid Panel    Component Value Date/Time   CHOL 217 (H) 05/21/2018 1130   TRIG 61 05/21/2018 1130   HDL 100 05/21/2018 1130   CHOLHDL 2.2 05/21/2018 1130   LDLCALC 105 (H) 05/21/2018 1130    Physical Exam:    VS:  BP 132/72   Pulse (!) 51   Ht 5\' 10"  (1.778 m)   Wt 198 lb 12.8 oz (90.2 kg)   SpO2 98%   BMI 28.52 kg/m     Wt Readings from Last 3 Encounters:  12/10/20 198 lb 12.8 oz (90.2 kg)  11/25/20 195 lb 3.2 oz (88.5 kg)  10/21/20 186 lb (84.4 kg)     GEN: Patient is in no acute distress HEENT: Normal NECK: No JVD; No carotid bruits LYMPHATICS: No lymphadenopathy CARDIAC: Hear sounds regular, 2/6 systolic murmur at the apex. RESPIRATORY:  Clear to auscultation without rales, wheezing or rhonchi  ABDOMEN: Soft, non-tender, non-distended MUSCULOSKELETAL:  No edema; No deformity  SKIN: Warm and dry NEUROLOGIC:  Alert and oriented x 3 PSYCHIATRIC:  Normal affect   Signed, Johnathan Lindau, MD  12/10/2020 10:22 AM    [facility]

## 2020-12-10 NOTE — Addendum Note (Signed)
Addended by: Truddie Hidden on: 12/10/2020 04:33 PM   Modules accepted: Orders

## 2020-12-10 NOTE — Patient Instructions (Signed)
Medication Instructions:  Your physician has recommended you make the following change in your medication:   Take Amiodarone 100 mg daily. Start Crestor 20 mg daily. I will send the RX in once we review you labs from Dr. Delena Bali office.  *If you need a refill on your cardiac medications before your next appointment, please call your pharmacy*   Lab Work: Your physician recommends that you return for lab work in: 6 weeks (01/21/21). You need to have labs done when you are fasting.  You can come Monday through Friday 8:30 am to 12:00 pm and 1:15 to 4:30. You do not need to make an appointment as the order has already been placed. The labs you are going to have done are BMET, LFT and Lipids.  If you have labs (blood work) drawn today and your tests are completely normal, you will receive your results only by: Marland Kitchen MyChart Message (if you have MyChart) OR . A paper copy in the mail If you have any lab test that is abnormal or we need to change your treatment, we will call you to review the results.   Testing/Procedures: None ordered   Follow-Up: At [facility], you and your health needs are our priority.  As part of our continuing mission to provide you with exceptional heart care, we have created designated Provider Care Teams.  These Care Teams include your primary Cardiologist (physician) and Advanced Practice Providers (APPs -  Physician Assistants and Nurse Practitioners) who all work together to provide you with the care you need, when you need it.  We recommend signing up for the patient portal called "MyChart".  Sign up information is provided on this After Visit Summary.  MyChart is used to connect with patients for Virtual Visits (Telemedicine).  Patients are able to view lab/test results, encounter notes, upcoming appointments, etc.  Non-urgent messages can be sent to your provider as well.   To learn more about what you can do with MyChart, go to [email or web address].    Your  next appointment:   2 month(s)  The format for your next appointment:   In Person  Provider:   Jyl Heinz, MD   Other Instructions Rosuvastatin Tablets What is this medicine? ROSUVASTATIN (roe SOO va sta tin) is known as a HMG-CoA reductase inhibitor or 'statin'. It lowers cholesterol and triglycerides in the blood. This drug may also reduce the risk of heart attack, stroke, or other health problems in patients with risk factors for heart disease. Diet and lifestyle changes are often used with this drug. This medicine may be used for other purposes; ask your health care provider or pharmacist if you have questions. COMMON BRAND NAME(S): Crestor What should I tell my health care provider before I take this medicine? They need to know if you have any of these conditions:  diabetes  if you often drink alcohol  history of stroke  kidney disease  liver disease  muscle aches or weakness  thyroid disease  an unusual or allergic reaction to rosuvastatin, other medicines, foods, dyes, or preservatives  pregnant or trying to get pregnant  breast-feeding How should I use this medicine? Take this medicine by mouth with a glass of water. Follow the directions on the prescription label. Do not cut, crush or chew this medicine. You can take this medicine with or without food. Take your doses at regular intervals. Do not take your medicine more often than directed. Talk to your pediatrician regarding the use of this medicine in children.  While this drug may be prescribed for children as young as 20 years old for selected conditions, precautions do apply. Overdosage: If you think you have taken too much of this medicine contact a poison control center or emergency room at once. NOTE: This medicine is only for you. Do not share this medicine with others. What if I miss a dose? If you miss a dose, take it as soon as you can. If your next dose is to be taken in less than 12 hours, then do  not take the missed dose. Take the next dose at your regular time. Do not take double or extra doses. What may interact with this medicine? Do not take this medicine with any of the following medications:  herbal medicines like red yeast rice This medicine may also interact with the following medications:  alcohol  antacids containing aluminum hydroxide or magnesium hydroxide  cyclosporine  other medicines for high cholesterol  some medicines for HIV infection  warfarin This list may not describe all possible interactions. Give your health care provider a list of all the medicines, herbs, non-prescription drugs, or dietary supplements you use. Also tell them if you smoke, drink alcohol, or use illegal drugs. Some items may interact with your medicine. What should I watch for while using this medicine? Visit your doctor or health care professional for regular check-ups. You may need regular tests to make sure your liver is working properly. Your health care professional may tell you to stop taking this medicine if you develop muscle problems. If your muscle problems do not go away after stopping this medicine, contact your health care professional. Do not become pregnant while taking this medicine. Women should inform their health care professional if they wish to become pregnant or think they might be pregnant. There is a potential for serious side effects to an unborn child. Talk to your health care professional or pharmacist for more information. Do not breast-feed an infant while taking this medicine. This medicine may increase blood sugar. Ask your healthcare provider if changes in diet or medicines are needed if you have diabetes. If you are going to need surgery or other procedure, tell your doctor that you are using this medicine. This drug is only part of a total heart-health program. Your doctor or a dietician can suggest a low-cholesterol and low-fat diet to help. Avoid alcohol and  smoking, and keep a proper exercise schedule. This medicine may cause a decrease in Co-Enzyme Q-10. You should make sure that you get enough Co-Enzyme Q-10 while you are taking this medicine. Discuss the foods you eat and the vitamins you take with your health care professional. What side effects may I notice from receiving this medicine? Side effects that you should report to your doctor or health care professional as soon as possible:  allergic reactions like skin rash, itching or hives, swelling of the face, lips, or tongue  confusion  joint pain  loss of memory  redness, blistering, peeling or loosening of the skin, including inside the mouth  signs and symptoms of high blood sugar such as being more thirsty or hungry or having to urinate more than normal. You may also feel very tired or have blurry vision.  signs and symptoms of muscle injury like dark urine; trouble passing urine or change in the amount of urine; unusually weak or tired; muscle pain or side or back pain  yellowing of the eyes or skin Side effects that usually do not require medical attention (  report to your doctor or health care professional if they continue or are bothersome):  constipation  diarrhea  dizziness  gas  headache  nausea  stomach pain  trouble sleeping  upset stomach This list may not describe all possible side effects. Call your doctor for medical advice about side effects. You may report side effects to FDA at 1-800-FDA-1088. Where should I keep my medicine? Keep out of the reach of children and pets. Store between 20 and 25 degrees C (68 and 77 degrees F). Get rid of any unused medicine after the expiration date. To get rid of medicines that are no longer needed or have expired:  Take the medicine to a medicine take-back program. Check with your pharmacy or law enforcement to find a location.  If you cannot return the medicine, check the label or package insert to see if the  medicine should be thrown out in the garbage or flushed down the toilet. If you are not sure, ask your health care provider. If it is safe to put it in the trash, take the medicine out of the container. Mix the medicine with cat litter, dirt, coffee grounds, or other unwanted substance. Seal the mixture in a bag or container. Put it in the trash. NOTE: This sheet is a summary. It may not cover all possible information. If you have questions about this medicine, talk to your doctor, pharmacist, or health care provider.  2021 Elsevier/Gold Standard (2020-09-20 09:55:07)

## 2020-12-10 NOTE — Telephone Encounter (Signed)
Pt aware that we have made a referral to the lipid clinic. Pt prefers call be made to his mobile number.

## 2020-12-11 ENCOUNTER — Ambulatory Visit: Payer: BC Managed Care – PPO | Admitting: Cardiology

## 2021-01-06 ENCOUNTER — Other Ambulatory Visit: Payer: Self-pay

## 2021-01-06 ENCOUNTER — Ambulatory Visit: Payer: BC Managed Care – PPO | Admitting: Internal Medicine

## 2021-01-06 DIAGNOSIS — T826XXD Infection and inflammatory reaction due to cardiac valve prosthesis, subsequent encounter: Secondary | ICD-10-CM

## 2021-01-06 DIAGNOSIS — I38 Endocarditis, valve unspecified: Secondary | ICD-10-CM

## 2021-01-06 DIAGNOSIS — M4646 Discitis, unspecified, lumbar region: Secondary | ICD-10-CM | POA: Diagnosis not present

## 2021-01-06 DIAGNOSIS — M009 Pyogenic arthritis, unspecified: Secondary | ICD-10-CM

## 2021-01-06 NOTE — Assessment & Plan Note (Signed)
He has persistent back pain but I think that this is related to severe degenerative arthritis and sequelae of previously treated and cured vertebral infection.  He will remain off of antibiotics and follow-up in 6 weeks.

## 2021-01-06 NOTE — Progress Notes (Signed)
[facility]  Patient Active Problem List   Diagnosis Date Noted  . Lumbar discitis 05/04/2020    Priority: High  . Prosthetic valve endocarditis ([place]) 04/13/2020    Priority: High  . Septic arthritis of knee, bilateral ([place]) 03/23/2020    Priority: High  . Bacteremia due to Enterococcus 03/21/2020    Priority: High  . S/P AVR 01/17/2020    Priority: High  . History of fusion of cervical spine 03/23/2020    Priority: Medium  . Anemia   . Dyspnea   . Dysrhythmia   . Heart murmur   . Hypothyroidism   . Drug-induced hypotension   . Hypotension due to drugs   . Hypokalemia   . Hypotension due to hypovolemia   . Anasarca   . History of prolonged Q-T interval on ECG   . Peripheral edema   . Labile blood glucose   . Atrial fibrillation with RVR ([place])   . Postoperative pain   . Transaminitis   . Hypoalbuminemia due to protein-calorie malnutrition ([place])   . Drug induced constipation   . Controlled type 2 diabetes mellitus with hyperglycemia, with long-term current use of insulin ([place])   . AKI (acute kidney injury) ([place])   . Pressure injury of skin 05/14/2020  . Debility 05/14/2020  . Acute blood loss anemia 05/02/2020  . Prolonged QT interval 05/02/2020  . Transient hypotension 05/02/2020  . Persistent atrial fibrillation ([place])   . Aortic valve abscess 04/13/2020  . Endocarditis 04/13/2020  . Typical atrial flutter ([place])   . Discitis 04/07/2020  . Moderate protein-calorie malnutrition ([place]) 03/24/2020  . Protein-calorie malnutrition, severe 03/24/2020  . Septic arthritis ([place]) 03/23/2020  . Pseudogout of left knee 03/21/2020  . ARF (acute renal failure) ([place]) 03/20/2020  . Renal insufficiency 01/17/2020  . S/P aortic valve replacement 01/17/2020  . Pre-operative cardiovascular examination 01/03/2020  . Acute otitis externa of left ear 11/13/2019  . Acute suppurative otitis media of left ear without spontaneous rupture of tympanic  membrane 11/13/2019  . Conductive hearing loss of left ear 11/13/2019  . Tinnitus of left ear 11/13/2019  . Lumbar stenosis with neurogenic claudication 06/11/2019  . Cervical myelopathy ([place]) 04/22/2019  . Preoperative cardiovascular examination 04/17/2019  . Intermittent claudication ([place]) 10/31/2018  . Coronary artery disease of native artery of native heart with stable angina pectoris ([place])   . Abnormal nuclear stress test 05/03/2018  . Aortic stenosis 05/03/2018  . Acid reflux 02/15/2018  . Arthritis 02/15/2018  . Hyperactive gag reflex 02/15/2018  . Macular degeneration 02/15/2018  . Abnormal LFTs (liver function tests) 10/31/2016  . Coronary artery disease involving native coronary artery of native heart without angina pectoris 05/30/2016  . Diabetes mellitus due to underlying condition with unspecified complications ([place]) 75/17/0017  . Dyslipidemia 05/30/2016  . Essential hypertension 05/30/2016  . PAF (paroxysmal atrial fibrillation) ([place]) 05/30/2016    Patient's Medications  New Prescriptions   No medications on file  Previous Medications   AMIODARONE (PACERONE) 200 MG TABLET    Take 100 mg by mouth daily.   DOXAZOSIN (CARDURA) 1 MG TABLET    Take 1 mg by mouth daily.   ELIQUIS 5 MG TABS TABLET    Take 5 mg by mouth 2 (two) times daily.   EMPAGLIFLOZIN (JARDIANCE PO)    Take 10 mg by mouth daily. Unsure of dose   EZETIMIBE (ZETIA) 10 MG TABLET    TAKE 1 TABLET BY MOUTH EVERYDAY  AT BEDTIME   FERROUS SULFATE 325 (65 FE) MG TABLET    Take 1 tablet (325 mg total) by mouth 2 (two) times daily with a meal.   FUROSEMIDE (LASIX) 20 MG TABLET    Take 20 mg by mouth 2 (two) times daily.   INVOKANA 300 MG TABS TABLET    Take 300 mg by mouth daily.   LEVOTHYROXINE (SYNTHROID) 25 MCG TABLET    Take 1 tablet (25 mcg total) by mouth daily before breakfast.   METOPROLOL TARTRATE (LOPRESSOR) 25 MG TABLET    Take 12.5 mg by mouth 2 (two) times daily.   POTASSIUM CHLORIDE (KLOR-CON) 20 MEQ  PACKET    Take 20 mEq by mouth daily.   PREGABALIN (LYRICA) 100 MG CAPSULE    Take 100 mg by mouth 3 (three) times daily.   SENNA (SENOKOT) 8.6 MG TABS TABLET    Take 1 tablet (8.6 mg total) by mouth at bedtime.   VITAMIN B-12 (CYANOCOBALAMIN) 1000 MCG TABLET    Take 1,000 mcg by mouth daily.   VITAMIN C (ASCORBIC ACID) 500 MG TABLET    Take 500 mg by mouth daily.    ZINC GLUCONATE 50 MG TABLET    Take 50 mg by mouth daily.  Modified Medications   No medications on file  Discontinued Medications   No medications on file    Subjective: Johnathan Murray is in for his routine follow-up visit.  He developed enterococcal bacteremia and was hospitalized on 03/20/2020 with widely disseminated infection.  He previously had prosthetic aortic valve surgery and developed prosthetic valve endocarditis and an aortic root abscess, bilateral knee septic arthritis and lumbar infection.  He underwent redo aortic root implant and knee surgery.  He completed a lengthy course of IV ceftriaxone and ampicillin 05/20/2020 before converting to oral amoxicillin.  He stopped his amoxicillin about 4 weeks ago.  He has not had any new problems suggesting relapse of infection.  He is hoping to start going to the gym.      Review of Systems: Review of Systems  Constitutional: Negative for chills, diaphoresis, fever, malaise/fatigue and weight loss.  HENT: Negative for congestion and sore throat.   Respiratory: Negative for cough and shortness of breath.   Cardiovascular: Negative for chest pain.  Gastrointestinal: Negative for abdominal pain, constipation, diarrhea, nausea and vomiting.  Genitourinary: Negative for dysuria.  Musculoskeletal: Positive for back pain and joint pain.  Skin: Negative for rash.  Neurological: Negative for headaches.    Past Medical History:  Diagnosis Date  . Abnormal LFTs (liver function tests) 10/31/2016  . Abnormal nuclear stress test 05/03/2018  . Acid reflux 02/15/2018   not at present  .  Acute blood loss anemia 05/02/2020  . Acute otitis externa of left ear 11/13/2019  . Acute suppurative otitis media of left ear without spontaneous rupture of tympanic membrane 11/13/2019  . AKI (acute kidney injury) ([place])   . Anasarca   . Anemia    low iron  . Aortic stenosis 05/03/2018  . Aortic valve abscess 04/13/2020  . ARF (acute renal failure) ([place]) 03/20/2020  . Arthritis 02/15/2018   Overview:  shoulder  . Atrial fibrillation with RVR ([employer])   . Bacteremia due to Enterococcus 03/21/2020  . Cervical myelopathy ([place]) 04/22/2019  . Conductive hearing loss of left ear 11/13/2019  . Controlled type 2 diabetes mellitus with hyperglycemia, with long-term current use of insulin ([place])   . Coronary artery disease involving native coronary artery of native heart without angina  pectoris 05/30/2016  . Coronary artery disease of native artery of native heart with stable angina pectoris ([place])    pt. denies  . Debility 05/14/2020  . Diabetes mellitus due to underlying condition with unspecified complications ([place]) 7/34/1936  . Discitis 04/07/2020  . Drug induced constipation   . Drug-induced hypotension   . Dyslipidemia 05/30/2016  . Dyspnea    upon exertion,  as of 06/05/09 not having any recent chest pain  . Dysrhythmia    a fib  . Endocarditis 04/13/2020  . Essential hypertension 05/30/2016  . Heart murmur    Has had an Aortic valve replacement  . History of fusion of cervical spine 03/23/2020  . History of prolonged Q-T interval on ECG   . Hyperactive gag reflex 02/15/2018  . Hypoalbuminemia due to protein-calorie malnutrition ([place])   . Hypokalemia   . Hypotension due to drugs   . Hypotension due to hypovolemia   . Hypothyroidism   . Intermittent claudication ([place]) 10/31/2018  . Labile blood glucose   . Lumbar discitis 05/04/2020  . Lumbar stenosis with neurogenic claudication 06/11/2019  . Macular degeneration 02/15/2018  . Moderate protein-calorie malnutrition ([place]) 03/24/2020  . PAF  (paroxysmal atrial fibrillation) ([place]) 05/30/2016  . Peripheral edema   . Persistent atrial fibrillation ([place])   . Postoperative pain   . Pre-operative cardiovascular examination 01/03/2020  . Preoperative cardiovascular examination 04/17/2019  . Pressure injury of skin 05/14/2020  . Prolonged QT interval 05/02/2020  . Prosthetic valve endocarditis ([place]) 04/13/2020  . Protein-calorie malnutrition, severe 03/24/2020  . Pseudogout of left knee 03/21/2020  . Renal insufficiency 01/17/2020  . S/P aortic valve replacement 01/17/2020  . S/P AVR 01/17/2020  . Septic arthritis ([place]) 03/23/2020  . Septic arthritis of knee, bilateral ([place]) 03/23/2020  . Tinnitus of left ear 11/13/2019  . Transaminitis   . Transient hypotension 05/02/2020  . Typical atrial flutter ([facility])     Social History   Tobacco Use  . Smoking status: Never Smoker  . Smokeless tobacco: Never Used  Vaping Use  . Vaping Use: Never used  Substance Use Topics  . Alcohol use: Not Currently  . Drug use: Never    Family History  Problem Relation Age of Onset  . Prostate cancer Brother   . Heart disease Maternal Grandmother   . Brain cancer Mother     Allergies  Allergen Reactions  . Atorvastatin Other (See Comments)    Urinary retention   . Nebivolol Diarrhea    Objective: Vitals:   01/06/21 1031  Pulse: (!) 57  Temp: 98 F (36.7 C)  TempSrc: Oral  SpO2: 99%  Weight: 200 lb (90.7 kg)   Body mass index is 28.7 kg/m.  Physical Exam Constitutional:      Comments: He is in good spirits.  He has gained a little over 20 pounds in the past 8 months.  Cardiovascular:     Rate and Rhythm: Normal rate and regular rhythm.     Heart sounds: No murmur heard.     Comments: He has a 1/6 systolic murmur. Pulmonary:     Effort: Pulmonary effort is normal.     Breath sounds: Normal breath sounds.  Abdominal:     Palpations: Abdomen is soft.     Tenderness: There is no abdominal tenderness.  Musculoskeletal:        General:  Tenderness present. No swelling.     Comments: His knee incisions are nicely healed.    Skin:    Findings: No  rash.  Neurological:     General: No focal deficit present.     Gait: Gait abnormal.     Comments: He walks slowly with the aid of a cane.  Psychiatric:        Mood and Affect: Mood normal.     Lab Results Sed Rate (mm/h)  Date Value  10/21/2020 9  09/09/2020 2  06/17/2020 19   CRP (mg/L)  Date Value  10/21/2020 4.1  09/09/2020 5.1  07/29/2020 12.2 (H)   Lumbar MRI 10/07/2020  IMPRESSION: Progression of multilevel degenerative changes and sequelae of discitis/osteomyelitis at L4-L5. Most notably, there is increased marked canal stenosis at L4-L5 with crowding of the cauda equina. Increased marked bilateral foraminal stenosis at this level. There is also increased marked canal stenosis at L3-L4. Similar moderate to marked right foraminal stenosis at L5-S1.  These results will be called to the ordering clinician or representative by the Radiologist Assistant, and communication documented in the PACS or [employer].    By: Macy Mis M.D.   On: 10/08/2020 10:30 Problem List Items Addressed This Visit      High   Septic arthritis of knee, bilateral ([place])    I believe that there is an excellent chance that his septic arthritis has been cured.  He will remain off of antibiotics and follow-up in 6 weeks.      Prosthetic valve endocarditis ([place])    He has no evidence of persistent endocarditis.      Lumbar discitis    He has persistent back pain but I think that this is related to severe degenerative arthritis and sequelae of previously treated and cured vertebral infection.  He will remain off of antibiotics and follow-up in 6 weeks.          Michel Bickers, MD [facility] [phone] pager   [phone] cell 01/06/2021, 10:50 AM

## 2021-01-06 NOTE — Assessment & Plan Note (Signed)
He has no evidence of persistent endocarditis.

## 2021-01-06 NOTE — Assessment & Plan Note (Signed)
I believe that there is an excellent chance that his septic arthritis has been cured.  He will remain off of antibiotics and follow-up in 6 weeks.

## 2021-01-21 ENCOUNTER — Ambulatory Visit
Admission: RE | Admit: 2021-01-21 | Discharge: 2021-01-21 | Disposition: A | Discharge status: Home / Self Care | Payer: BC Managed Care – PPO | Source: Ambulatory Visit | Attending: Neurosurgery | Admitting: Neurosurgery

## 2021-01-21 ENCOUNTER — Other Ambulatory Visit: Payer: Self-pay

## 2021-01-21 DIAGNOSIS — M4620 Osteomyelitis of vertebra, site unspecified: Secondary | ICD-10-CM | POA: Diagnosis not present

## 2021-01-21 DIAGNOSIS — M545 Low back pain, unspecified: Secondary | ICD-10-CM | POA: Diagnosis not present

## 2021-01-21 DIAGNOSIS — Z6828 Body mass index (BMI) 28.0-28.9, adult: Secondary | ICD-10-CM | POA: Insufficient documentation

## 2021-01-21 HISTORY — DX: Body mass index (BMI) 28.0-28.9, adult: Z68.28

## 2021-02-16 ENCOUNTER — Ambulatory Visit: Payer: BC Managed Care – PPO | Admitting: Cardiology

## 2021-02-17 ENCOUNTER — Ambulatory Visit: Payer: BC Managed Care – PPO | Admitting: Internal Medicine

## 2021-02-19 DIAGNOSIS — E039 Hypothyroidism, unspecified: Secondary | ICD-10-CM | POA: Diagnosis not present

## 2021-02-19 DIAGNOSIS — E785 Hyperlipidemia, unspecified: Secondary | ICD-10-CM | POA: Diagnosis not present

## 2021-02-19 DIAGNOSIS — I4891 Unspecified atrial fibrillation: Secondary | ICD-10-CM | POA: Diagnosis not present

## 2021-02-19 DIAGNOSIS — R809 Proteinuria, unspecified: Secondary | ICD-10-CM | POA: Diagnosis not present

## 2021-02-19 DIAGNOSIS — E1129 Type 2 diabetes mellitus with other diabetic kidney complication: Secondary | ICD-10-CM | POA: Diagnosis not present

## 2021-02-19 DIAGNOSIS — D509 Iron deficiency anemia, unspecified: Secondary | ICD-10-CM | POA: Diagnosis not present

## 2021-02-23 ENCOUNTER — Other Ambulatory Visit: Payer: Self-pay | Admitting: Cardiology

## 2021-02-24 NOTE — Telephone Encounter (Signed)
56M, 90.7KG, SCR 1.81 (11/28/20), lovw/revankar 12/10/20

## 2021-02-26 ENCOUNTER — Ambulatory Visit: Payer: BC Managed Care – PPO | Admitting: Internal Medicine

## 2021-03-10 ENCOUNTER — Ambulatory Visit: Payer: BC Managed Care – PPO | Admitting: Internal Medicine

## 2021-03-17 ENCOUNTER — Ambulatory Visit: Payer: BC Managed Care – PPO | Admitting: Internal Medicine

## 2021-03-22 ENCOUNTER — Other Ambulatory Visit: Payer: Self-pay

## 2021-03-22 DIAGNOSIS — M4620 Osteomyelitis of vertebra, site unspecified: Secondary | ICD-10-CM

## 2021-03-22 HISTORY — DX: Osteomyelitis of vertebra, site unspecified: M46.20

## 2021-03-30 ENCOUNTER — Encounter: Payer: Self-pay | Admitting: Cardiology

## 2021-03-30 ENCOUNTER — Other Ambulatory Visit: Payer: Self-pay

## 2021-03-30 ENCOUNTER — Ambulatory Visit: Payer: BC Managed Care – PPO | Admitting: Cardiology

## 2021-03-30 VITALS — BP 130/78 | HR 52 | Ht 70.0 in | Wt 202.4 lb

## 2021-03-30 DIAGNOSIS — I480 Paroxysmal atrial fibrillation: Secondary | ICD-10-CM | POA: Diagnosis not present

## 2021-03-30 DIAGNOSIS — I2510 Atherosclerotic heart disease of native coronary artery without angina pectoris: Secondary | ICD-10-CM

## 2021-03-30 DIAGNOSIS — I10 Essential (primary) hypertension: Secondary | ICD-10-CM | POA: Diagnosis not present

## 2021-03-30 DIAGNOSIS — Z952 Presence of prosthetic heart valve: Secondary | ICD-10-CM

## 2021-03-30 NOTE — Progress Notes (Signed)
Cardiology Office Note:    Date:  03/30/2021   ID:  Johnathan Murray, DOB January 06, 1951, MRN [id number]  PCP:  Nicoletta Dress, MD  Cardiologist:  Jenean Lindau, MD   Referring MD: Nicoletta Dress, MD    ASSESSMENT:    1. Coronary artery disease involving native coronary artery of native heart without angina pectoris   2. Essential (primary) hypertension   3. PAF (paroxysmal atrial fibrillation) ([place])   4. S/P aortic valve replacement    PLAN:    In order of problems listed above:  1. Coronary artery disease: Secondary prevention stressed with the patient.  Importance of compliance with diet medication stressed and vocalized understanding. 2. Essential hypertension: Blood pressure stable and diet was emphasized. 3. Mixed dyslipidemia and diabetes mellitus: I discussed findings with the patient at length.  He is doing well to be compliant with diet and medications.  He is having an appointment with her lipid clinic.  He postponed the last 1 because of upper respiratory tract infection like symptoms.  He has an appointment coming up in July.  His lipids are already looking better but not at goal. 4. Paroxysmal atrial fibrillation:I discussed with the patient atrial fibrillation, disease process. Management and therapy including rate and rhythm control, anticoagulation benefits and potential risks were discussed extensively with the patient. Patient had multiple questions which were answered to patient's satisfaction.  Ibuprofen, benefits and potential risks explained any vocalized understanding.  He wants to continue this for a certain period of time because he is worried about getting back into arrhythmia issues and complications such as hospitalization.  I respect his wishes.  I have requested records to be reviewed from his primary care physician especially LFTs. 5. Patient will be seen in follow-up appointment in 6 months or earlier if the patient has any  concerns    Medication Adjustments/Labs and Tests Ordered: Current medicines are reviewed at length with the patient today.  Concerns regarding medicines are outlined above.  No orders of the defined types were placed in this encounter.  No orders of the defined types were placed in this encounter.    No chief complaint on file.    History of Present Illness:    Johnathan Murray is a 70 y.o. male.  Patient has past medical history of coronary artery disease, aortic valve replacement.  Patient developed prosthetic valve endocarditis and subsequently had to undergo repeat surgery.  Patient has paroxysmal atrial fibrillation, essential hypertension and dyslipidemia.  He also developed septic endocarditis and arthritis.  Patient has been treated for this and has done fine.  He denies any chest pain orthopnea or PND.  At the time of my evaluation, the patient is alert awake oriented and in no distress.  Past Medical History:  Diagnosis Date  . Abnormal LFTs (liver function tests) 10/31/2016  . Abnormal nuclear stress test 05/03/2018  . Acid reflux 02/15/2018   not at present  . Acute blood loss anemia 05/02/2020  . Acute otitis externa of left ear 11/13/2019  . Acute suppurative otitis media of left ear without spontaneous rupture of tympanic membrane 11/13/2019  . AKI (acute kidney injury) ([place])   . Anasarca   . Anemia    low iron  . Aortic stenosis 05/03/2018  . Aortic valve abscess 04/13/2020  . ARF (acute renal failure) ([place]) 03/20/2020  . Arthritis 02/15/2018   Overview:  shoulder  . Atrial fibrillation with RVR ([place])   . Bacteremia due to Enterococcus 03/21/2020  .  Body mass index (BMI) 28.0-28.9, adult 01/21/2021  . Cervical myelopathy ([place]) 04/22/2019  . Conductive hearing loss of left ear 11/13/2019  . Controlled type 2 diabetes mellitus with hyperglycemia, with long-term current use of insulin ([place])   . Coronary artery disease involving native coronary artery of native heart  without angina pectoris 05/30/2016  . Coronary artery disease of native artery of native heart with stable angina pectoris ([place])    pt. denies  . Debility 05/14/2020  . Diabetes mellitus due to underlying condition with unspecified complications ([place]) 1/61/0960  . Discitis 04/07/2020  . Drug induced constipation   . Drug-induced hypotension   . Dyslipidemia 05/30/2016  . Dyspnea    upon exertion,  as of 06/05/09 not having any recent chest pain  . Dysrhythmia    a fib  . Endocarditis 04/13/2020  . Essential (primary) hypertension 05/30/2016  . Essential hypertension 05/30/2016  . Heart murmur    Has had an Aortic valve replacement  . History of fusion of cervical spine 03/23/2020  . History of prolonged Q-T interval on ECG   . Hyperactive gag reflex 02/15/2018  . Hypoalbuminemia due to protein-calorie malnutrition ([place])   . Hypokalemia   . Hypotension due to drugs   . Hypotension due to hypovolemia   . Hypothyroidism   . Intermittent claudication ([place]) 10/31/2018  . Labile blood glucose   . Lumbar discitis 05/04/2020  . Lumbar stenosis with neurogenic claudication 06/11/2019  . Macular degeneration 02/15/2018  . Moderate protein-calorie malnutrition ([place]) 03/24/2020  . Osteomyelitis of spine ([place]) 07/02/2020  . Osteomyelitis of vertebra ([place]) 03/22/2021  . Osteomyelitis of vertebra, site unspecified ([place]) 07/30/2020  . PAF (paroxysmal atrial fibrillation) ([place]) 05/30/2016  . Peripheral edema   . Persistent atrial fibrillation ([place])   . Postoperative pain   . Pre-operative cardiovascular examination 01/03/2020  . Preoperative cardiovascular examination 04/17/2019  . Pressure injury of skin 05/14/2020  . Prolonged QT interval 05/02/2020  . Prosthetic valve endocarditis ([place]) 04/13/2020  . Protein-calorie malnutrition, severe 03/24/2020  . Pseudogout of left knee 03/21/2020  . Renal insufficiency 01/17/2020  . S/P aortic valve replacement 01/17/2020  . S/P AVR 01/17/2020  . Septic arthritis ([place])  03/23/2020  . Septic arthritis of knee, bilateral ([place]) 03/23/2020  . Spinal stenosis in cervical region 03/14/2019  . Spinal stenosis of lumbar region 03/14/2019  . Tinnitus of left ear 11/13/2019  . Transaminitis   . Transient hypotension 05/02/2020  . Typical atrial flutter [facility])     Past Surgical History:  Procedure Laterality Date  . ANTERIOR CERVICAL DECOMP/DISCECTOMY FUSION N/A 04/22/2019   Procedure: Anterior Cervical Discectomy and Fusion, Cervical four-five, Cervical five-six;  Surgeon: Earnie Larsson, MD;  Location: [place];  Service: Neurosurgery;  Laterality: N/A;  . ANTERIOR CERVICAL DECOMP/DISCECTOMY FUSION N/A 03/02/2020   Procedure: Anterior Cervical Decompression Fusion  - Cervical three-Cervical four;  Surgeon: Earnie Larsson, MD;  Location: [place];  Service: Neurosurgery;  Laterality: N/A;  . AORTIC VALVE REPLACEMENT    . ASCENDING AORTIC ROOT REPLACEMENT N/A 04/16/2020   Procedure: ASCENDING AORTIC ROOT REPLACEMENT - HOMOGRAFT using [place] 23 MM Cryo Aortic Valve.;  Surgeon: Gaye Pollack, MD;  Location: [facility];  Service: Open Heart Surgery;  Laterality: N/A;  . CARDIAC CATHETERIZATION     several stents  . CARDIAC SURGERY    . CARDIOVERSION N/A 04/13/2020   Procedure: CARDIOVERSION;  Surgeon: Geralynn Rile, MD;  Location: [place];  Service: Cardiovascular;  Laterality: N/A;  . CARDIOVERSION N/A  04/29/2020   Procedure: CARDIOVERSION;  Surgeon: Skeet Latch, MD;  Location[facility];  Service: Cardiovascular;  Laterality: N/A;  . CORONARY ARTERY BYPASS GRAFT  2017   patient unsure how many, had at [facility]  . HAND SURGERY    . I & D EXTREMITY Bilateral 05/02/2020   Procedure: IRRIGATION AND DEBRIDEMENT BILATERAL KNEES;  Surgeon: Newt Minion, MD;  Location: [place];  Service: Orthopedics;  Laterality: Bilateral;  . IR LUMBAR DISC ASPIRATION W/IMG GUIDE  04/07/2020  . IRRIGATION AND DEBRIDEMENT KNEE Bilateral 03/24/2020   Procedure: ARTHROSCOPY  IRRIGATION AND DEBRIDEMENT BILATERAL  KNEE;  Surgeon: Marchia Bond, MD;  Location: [place];  Service: Orthopedics;  Laterality: Bilateral;  . KNEE SURGERY    . LEFT HEART CATH AND CORONARY ANGIOGRAPHY N/A 01/21/2020   Procedure: LEFT HEART CATH AND CORONARY ANGIOGRAPHY;  Surgeon: Belva Crome, MD;  Location: [place] CV LAB;  Service: Cardiovascular;  Laterality: N/A;  . LEFT HEART CATH AND CORS/GRAFTS ANGIOGRAPHY N/A 05/16/2018   Procedure: LEFT HEART CATH AND CORS/GRAFTS ANGIOGRAPHY;  Surgeon: Burnell Blanks, MD;  Location: [place] CV LAB;  Service: Cardiovascular;  Laterality: N/A;  . LUMBAR LAMINECTOMY/DECOMPRESSION MICRODISCECTOMY Bilateral 06/11/2019   Procedure: Bilateral Lumbar Four-FiveLaminectomy/Foraminotomy;  Surgeon: Earnie Larsson, MD;  Location: [place];  Service: Neurosurgery;  Laterality: Bilateral;  posterior  . SYNOVECTOMY Bilateral 05/02/2020   Procedure: SYNOVECTOMY;  Surgeon: Newt Minion, MD;  Location: [place];  Service: Orthopedics;  Laterality: Bilateral;  . TEE WITHOUT CARDIOVERSION N/A 03/26/2020   Procedure: TRANSESOPHAGEAL ECHOCARDIOGRAM (TEE);  Surgeon: Jerline Pain, MD;  Location: [facility] ENDOSCOPY;  Service: Cardiovascular;  Laterality: N/A;  . TEE WITHOUT CARDIOVERSION N/A 04/13/2020   Procedure: TRANSESOPHAGEAL ECHOCARDIOGRAM (TEE);  Surgeon: Geralynn Rile, MD;  Location: [place] [facility];  Service: Cardiovascular;  Laterality: N/A;  . TEE WITHOUT CARDIOVERSION N/A 04/29/2020   Procedure: TRANSESOPHAGEAL ECHOCARDIOGRAM (TEE);  Surgeon: Skeet Latch, MD;  Location: [place];  Service: Cardiovascular;  Laterality: N/A;  . TEE WITHOUT CARDIOVERSION N/A 04/16/2020   Procedure: TRANSESOPHAGEAL ECHOCARDIOGRAM (TEE);  Surgeon: Gaye Pollack, MD;  Location: [place];  Service: Open Heart Surgery;  Laterality: N/A;    Current Medications: Current Meds  Medication Sig  . amiodarone (PACERONE) 200 MG tablet Take 100 mg by mouth daily.  Marland Kitchen apixaban (ELIQUIS) 5  MG TABS tablet Take 5 mg by mouth 2 (two) times daily.  Marland Kitchen doxazosin (CARDURA) 1 MG tablet Take 1 mg by mouth daily.  . empagliflozin (JARDIANCE) 25 MG TABS tablet Take 25 mg by mouth daily.  Marland Kitchen ezetimibe (ZETIA) 10 MG tablet Take 10 mg by mouth daily.  . ferrous sulfate 325 (65 FE) MG EC tablet Take 325 mg by mouth daily at 12 noon.  . furosemide (LASIX) 20 MG tablet Take 20 mg by mouth 2 (two) times daily.  Marland Kitchen levothyroxine (SYNTHROID) 25 MCG tablet Take 1 tablet (25 mcg total) by mouth daily before breakfast.  . metoprolol tartrate (LOPRESSOR) 25 MG tablet Take 12.5 mg by mouth 2 (two) times daily.  . potassium chloride (KLOR-CON) 20 MEQ packet Take 20 mEq by mouth daily.  . pregabalin (LYRICA) 100 MG capsule Take 100 mg by mouth 3 (three) times daily.  . vitamin B-12 (CYANOCOBALAMIN) 1000 MCG tablet Take 1,000 mcg by mouth daily.  . vitamin C (ASCORBIC ACID) 500 MG tablet Take 500 mg by mouth daily.   Marland Kitchen zinc gluconate 50 MG tablet Take 50 mg by mouth daily.  Allergies:   Rosuvastatin, Atorvastatin, and Nebivolol   Social History   Socioeconomic History  . Marital status: Married    Spouse name: [name]  . Number of children: 1  . Years of education: Not on file  . Highest education level: GED or equivalent  Occupational History    Employer: [employer]  Tobacco Use  . Smoking status: Never Smoker  . Smokeless tobacco: Never Used  Vaping Use  . Vaping Use: Never used  Substance and Sexual Activity  . Alcohol use: Not Currently  . Drug use: Never  . Sexual activity: Not Currently  Other Topics Concern  . Not on file  Social History Narrative   He drives a truck for [employer].   He lives with wife in a one-level home.   Highest level of education:  GED      Patient is right-handed. There are 8 steps to enter his home.   Social Determinants of Health   Financial Resource Strain: Not on file  Food Insecurity: Not on file  Transportation Needs: Not on  file  Physical Activity: Not on file  Stress: Not on file  Social Connections: Not on file     Family History: The patient's family history includes Brain cancer in his mother; Heart disease in his maternal grandmother; Prostate cancer in his brother.  ROS:   Please see the history of present illness.    All other systems reviewed and are negative.  EKGs/Labs/Other Studies Reviewed:    The following studies were reviewed today: I discussed my findings with the patient at length.  EKG was sinus bradycardia with nonspecific ST-T changes   Recent Labs: 05/14/2020: Magnesium 2.2 05/18/2020: B Natriuretic Peptide 134.7 06/24/2020: TSH 6.720 07/28/2020: ALT 25 10/21/2020: BUN 38; Creat 1.75; Hemoglobin 14.7; Platelets 185; Potassium 4.2; Sodium 140  Recent Lipid Panel    Component Value Date/Time   CHOL 217 (H) 05/21/2018 1130   TRIG 61 05/21/2018 1130   HDL 100 05/21/2018 1130   CHOLHDL 2.2 05/21/2018 1130   LDLCALC 105 (H) 05/21/2018 1130    Physical Exam:    VS:  BP 130/78   Pulse (!) 52   Ht 5\' 10"  (1.778 m)   Wt 202 lb 6.4 oz (91.8 kg)   SpO2 95%   BMI 29.04 kg/m     Wt Readings from Last 3 Encounters:  03/30/21 202 lb 6.4 oz (91.8 kg)  01/06/21 200 lb (90.7 kg)  12/10/20 198 lb 12.8 oz (90.2 kg)     GEN: Patient is in no acute distress HEENT: Normal NECK: No JVD; No carotid bruits LYMPHATICS: No lymphadenopathy CARDIAC: Hear sounds regular, 2/6 systolic murmur at the apex. RESPIRATORY:  Clear to auscultation without rales, wheezing or rhonchi  ABDOMEN: Soft, non-tender, non-distended MUSCULOSKELETAL:  No edema; No deformity  SKIN: Warm and dry NEUROLOGIC:  Alert and oriented x 3 PSYCHIATRIC:  Normal affect   Signed, Jenean Lindau, MD  03/30/2021 2:03 PM    [facility]

## 2021-03-30 NOTE — Patient Instructions (Signed)

## 2021-04-07 DIAGNOSIS — M5136 Other intervertebral disc degeneration, lumbar region: Secondary | ICD-10-CM | POA: Diagnosis not present

## 2021-04-14 ENCOUNTER — Ambulatory Visit: Payer: BC Managed Care – PPO | Admitting: Internal Medicine

## 2021-04-29 DIAGNOSIS — Z20828 Contact with and (suspected) exposure to other viral communicable diseases: Secondary | ICD-10-CM | POA: Diagnosis not present

## 2021-05-12 IMAGING — RF DG CERVICAL SPINE 1V
1 series · 1 of 1 positions shown · non-contrast
Comparison: Myelography 12/27/2019

CLINICAL DATA: Anterior cervical decompression fusion

EXAM:
DG C-ARM 1-60 MIN; DG CERVICAL SPINE - 1 VIEW

[Series 1: run · 1 of 1 slices shown]
[im 1/1]
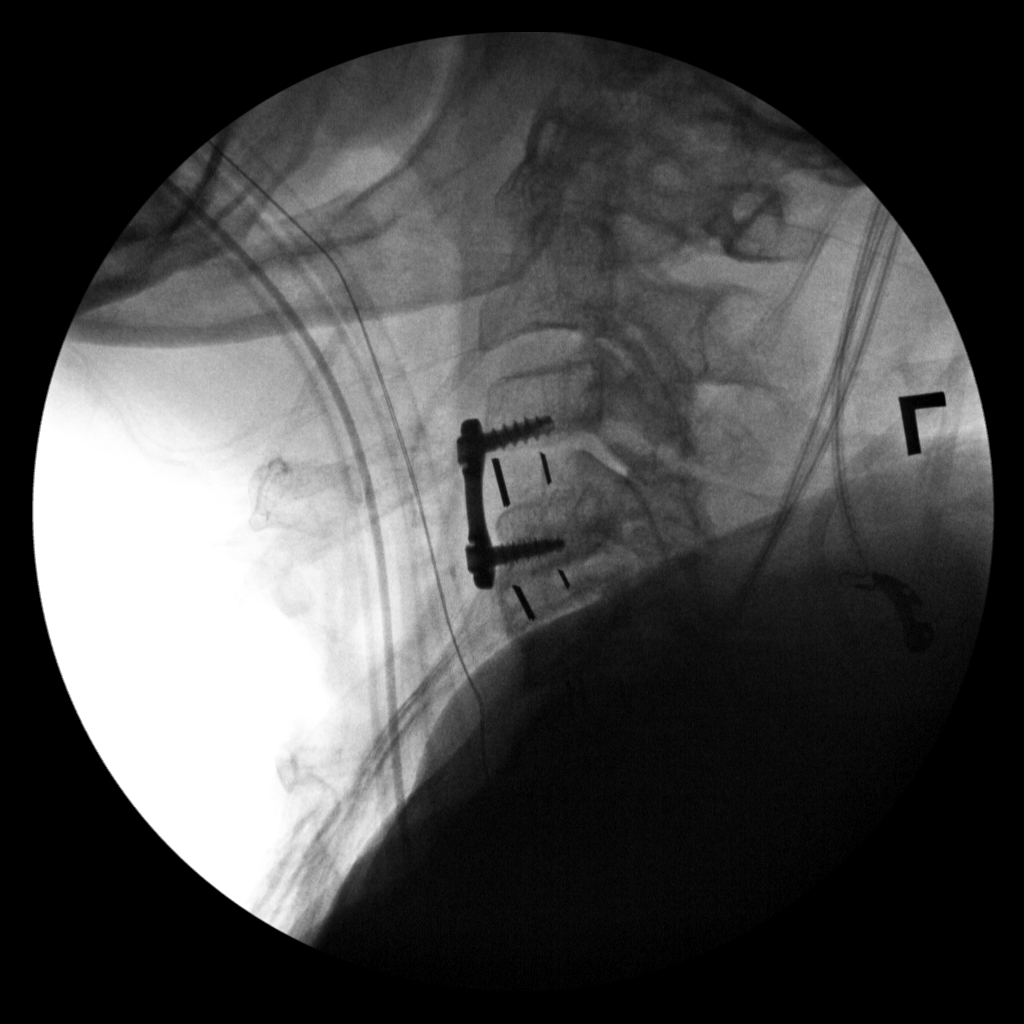

[1 of 1 positions shown; findings below may reference images not displayed]

FINDINGS: Images depict interval removal of the prior C 4-C7 cervical spinal
fusion hardware with placement of a C2-3 anterior fusion plate and
interbody spacer which appears in satisfactory alignment. Patient is
intubated at the time of exam. No acute osseous or soft tissue
complications are seen.
IMPRESSION: Intraoperative radiographs depicting interval removal of a C4-C7
anterior spinal fusion and placement of a C3-4 anterior fusion and
interbody spacer.

## 2021-05-12 IMAGING — RF DG C-ARM 1-60 MIN
1 series · 1 of 1 positions shown · non-contrast
Comparison: Myelography 12/27/2019

CLINICAL DATA: Anterior cervical decompression fusion

EXAM:
DG C-ARM 1-60 MIN; DG CERVICAL SPINE - 1 VIEW

[Series 1: run · 1 of 1 slices shown]
[im 1/1]
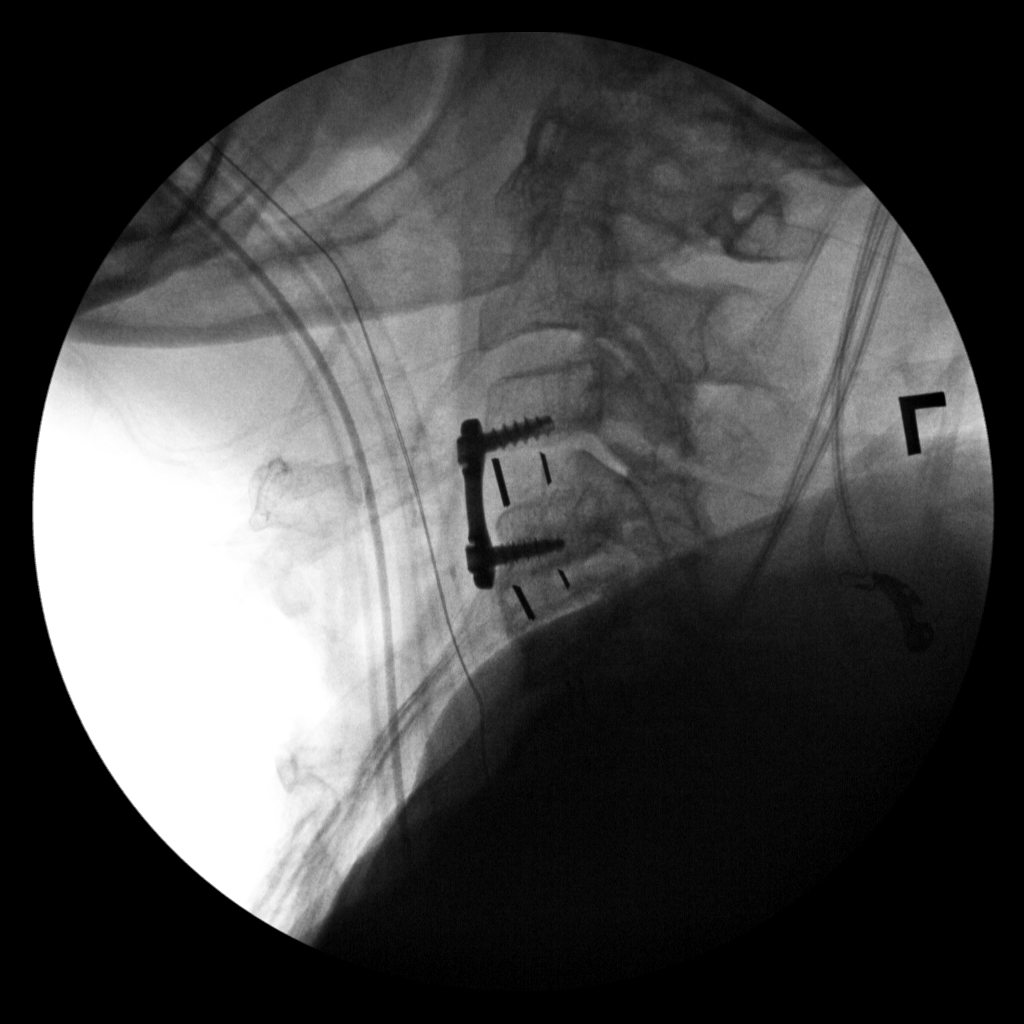

[1 of 1 positions shown; findings below may reference images not displayed]

FINDINGS: Images depict interval removal of the prior C 4-C7 cervical spinal
fusion hardware with placement of a C2-3 anterior fusion plate and
interbody spacer which appears in satisfactory alignment. Patient is
intubated at the time of exam. No acute osseous or soft tissue
complications are seen.
IMPRESSION: Intraoperative radiographs depicting interval removal of a C4-C7
anterior spinal fusion and placement of a C3-4 anterior fusion and
interbody spacer.

## 2021-05-13 DIAGNOSIS — Z20828 Contact with and (suspected) exposure to other viral communicable diseases: Secondary | ICD-10-CM | POA: Diagnosis not present

## 2021-05-13 DIAGNOSIS — R509 Fever, unspecified: Secondary | ICD-10-CM | POA: Diagnosis not present

## 2021-05-13 DIAGNOSIS — R051 Acute cough: Secondary | ICD-10-CM | POA: Diagnosis not present

## 2021-05-28 DIAGNOSIS — Z139 Encounter for screening, unspecified: Secondary | ICD-10-CM | POA: Diagnosis not present

## 2021-05-28 DIAGNOSIS — I4891 Unspecified atrial fibrillation: Secondary | ICD-10-CM | POA: Diagnosis not present

## 2021-05-28 DIAGNOSIS — D509 Iron deficiency anemia, unspecified: Secondary | ICD-10-CM | POA: Diagnosis not present

## 2021-05-28 DIAGNOSIS — E785 Hyperlipidemia, unspecified: Secondary | ICD-10-CM | POA: Diagnosis not present

## 2021-05-28 DIAGNOSIS — E039 Hypothyroidism, unspecified: Secondary | ICD-10-CM | POA: Diagnosis not present

## 2021-05-28 DIAGNOSIS — E1129 Type 2 diabetes mellitus with other diabetic kidney complication: Secondary | ICD-10-CM | POA: Diagnosis not present

## 2021-05-28 DIAGNOSIS — R809 Proteinuria, unspecified: Secondary | ICD-10-CM | POA: Diagnosis not present

## 2021-05-28 DIAGNOSIS — Z23 Encounter for immunization: Secondary | ICD-10-CM | POA: Diagnosis not present

## 2021-06-01 DIAGNOSIS — L821 Other seborrheic keratosis: Secondary | ICD-10-CM | POA: Diagnosis not present

## 2021-06-01 DIAGNOSIS — L578 Other skin changes due to chronic exposure to nonionizing radiation: Secondary | ICD-10-CM | POA: Diagnosis not present

## 2021-06-01 DIAGNOSIS — L570 Actinic keratosis: Secondary | ICD-10-CM | POA: Diagnosis not present

## 2021-06-16 ENCOUNTER — Ambulatory Visit (HOSPITAL_BASED_OUTPATIENT_CLINIC_OR_DEPARTMENT_OTHER): Payer: BC Managed Care – PPO | Admitting: Internal Medicine

## 2021-06-17 IMAGING — XA IR DISC ASPIRATION W/IMAG GUIDE
1 series · 9 of 9 positions shown · non-contrast
Comparison: none

INDICATION: 68-year-old male with history of paroxysmal atrial fibrillation,
hypertension, bioprosthetic aortic valve replacement who had
undergone recent cervical discectomy and fusion about 3 weeks ago.
Admitted now with enterococcal bacteremia, sepsis now s/p 2 weeks of
IV antibiotics. Patient was progressing well until the end of last
week when he developed new back pain. MRI of the lumbar spine is
concerning for L4-5 discitis. He comes or service for a fluoroscopy
guided fine needle aspiration.

[Series 300: spine · 9 of 9 slices shown]
[im 1/9]
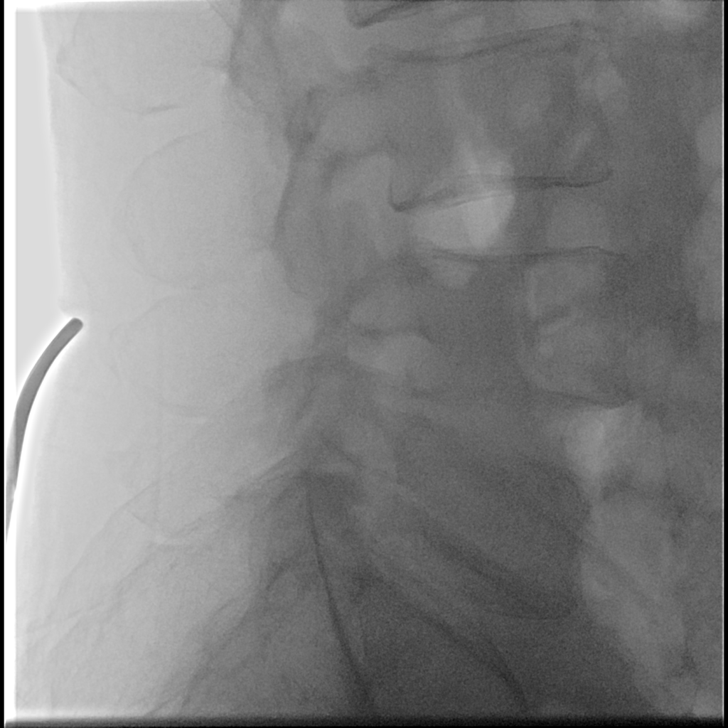
[im 2/9]
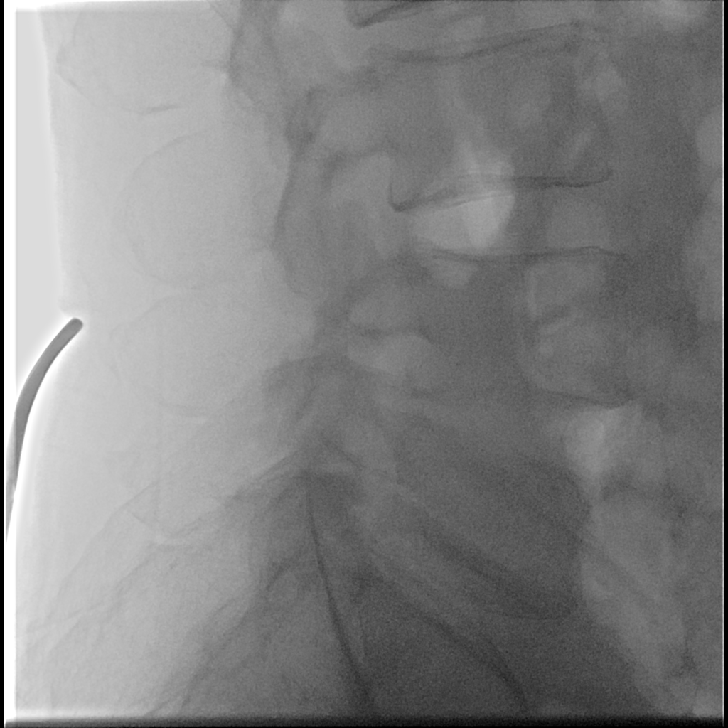
[im 3/9]
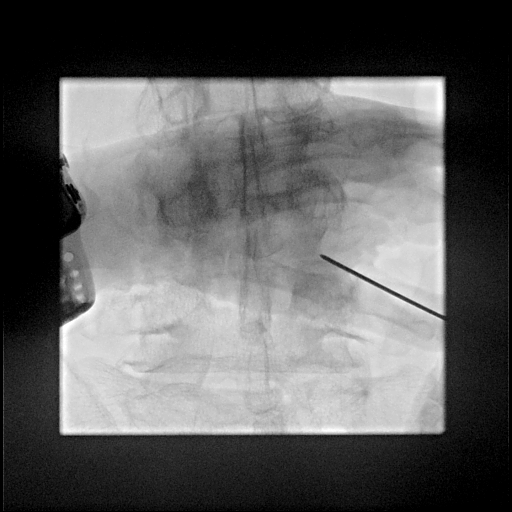
[im 4/9]
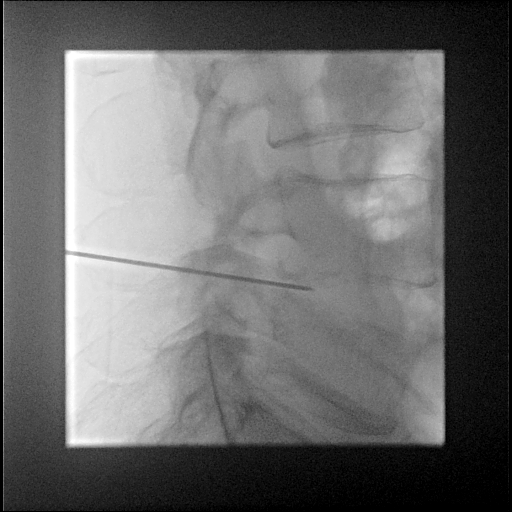
[im 5/9]
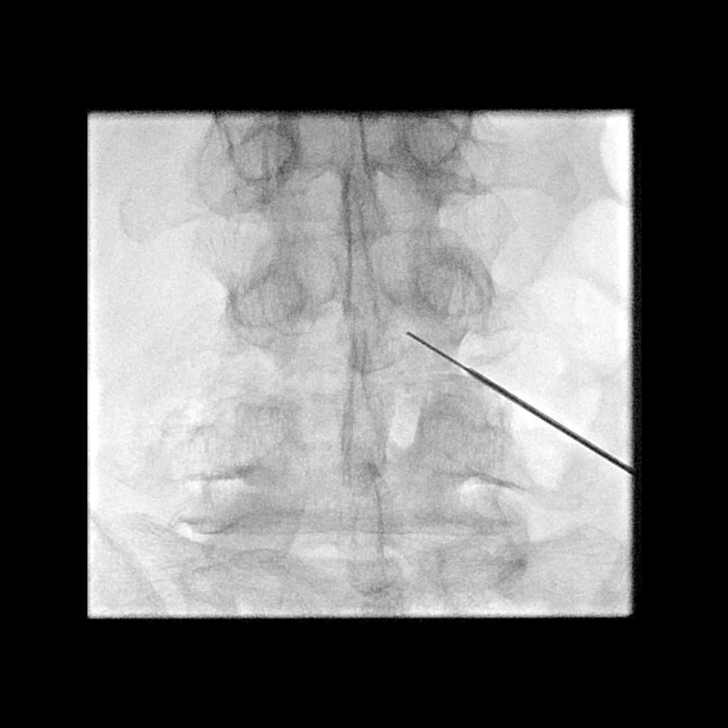
[im 6/9]
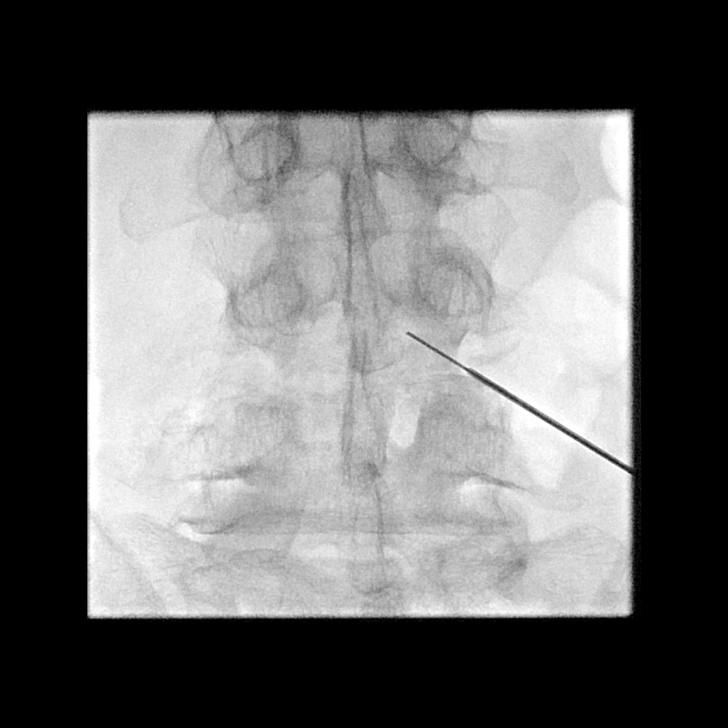
[im 7/9]
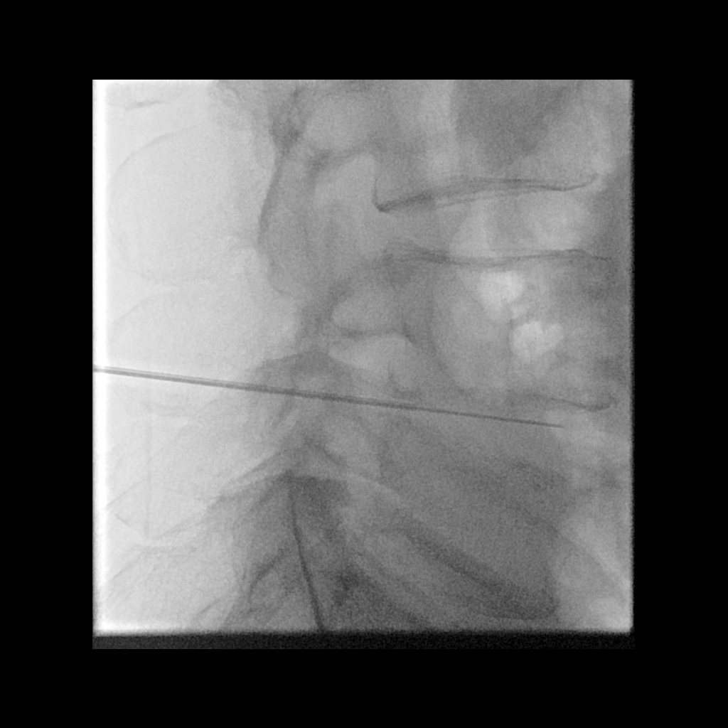
[im 8/9]
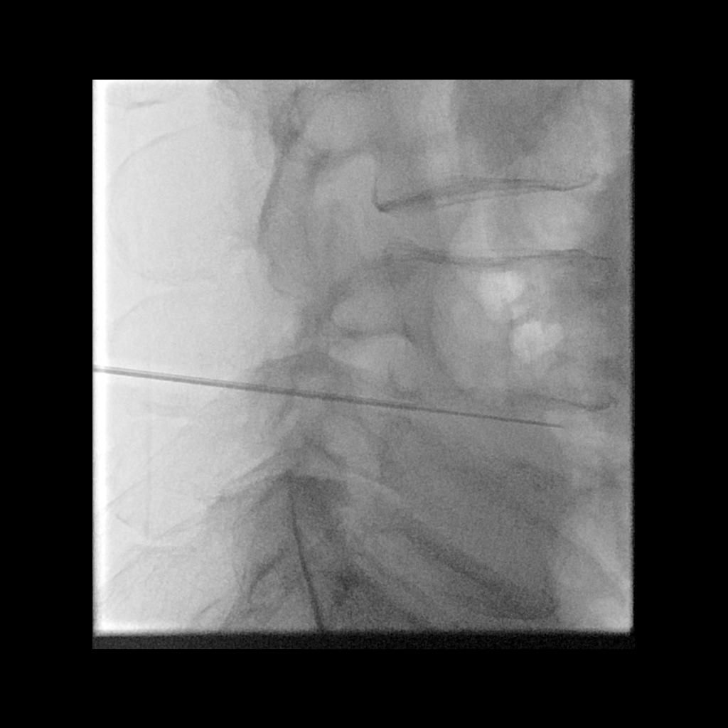
[im 9/9]
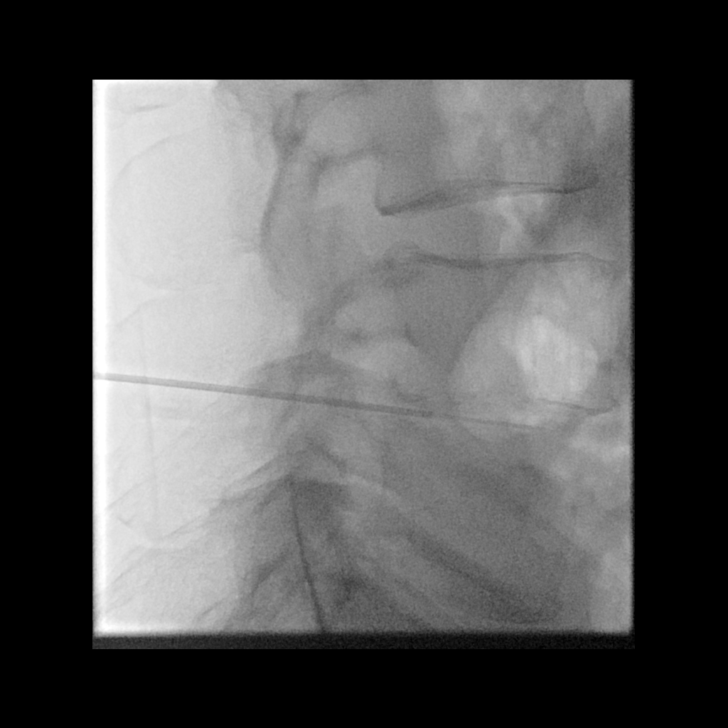

[9 of 9 positions shown; findings below may reference images not displayed]

EXAM:
Fluoroscopy guided L4-5 disc fine needle aspiration

MEDICATIONS:
The patient is currently admitted to the hospital and receiving
intravenous antibiotics. The antibiotics were administered within an
appropriate time frame prior to the initiation of the procedure.

ANESTHESIA/SEDATION:
Fentanyl 2 mcg IV; Versed 50 mg IV

Moderate Sedation Time:  13

The patient was continuously monitored during the procedure by the
interventional radiology nurse under my direct supervision.

COMPLICATIONS:
None immediate.

PROCEDURE:
Informed written consent was obtained from the patient after a
thorough discussion of the procedural risks, benefits and
alternatives. All questions were addressed. Maximal Sterile Barrier
Technique was utilized including caps, mask, sterile gowns, sterile
gloves, sterile drape, hand hygiene and skin antiseptic. A timeout
was performed prior to the initiation of the procedure.

The patient was made to lie prone on the angiography table. The
lower lumbar spine was prepped and draped in a sterile fashion.

Under fluoroscopy, the L4-5 disc space was delineated and the skin
area was marked. The skin was infiltrated with a 1% Lidocaine
approximately 5 cm lateral to the spinous process projection on the
right. Then, an 18 gauge by 15 cm spinal needle was advanced under
fluoroscopy into the L4-5 disc space. A 22 gauge x 20 cm spinal
needle was then advanced through the 18 gauge needle into the L4-5
disc. The disc was aspirated and serosanguineous fluid was obtained
and sent into 2 capped syringes to the laboratory for microbiology
analysis. The needle was subsequently withdrawn.

The access site was cleaned and covered with a sterile bandage.
IMPRESSION: Successful and uncomplicated fluoroscopy guided fine needle
aspiration of the L4-5 disc. Sample sent for culture and staining.

## 2021-06-24 IMAGING — CT CT HEART MORP W/ CTA COR W/ SCORE W/ CA W/CM &/OR W/O CM
2 of 7 series · 11 of 20 positions shown, 13 images · non-contrast
Comparison: None.
COMPARISON: None.

Addendum:
EXAM:
OVER-READ INTERPRETATION  CT CHEST

The following report is an over-read performed by radiologist Dr.
Carlinton Torralba [REDACTED] on 04/14/2020. This
over-read does not include interpretation of cardiac or coronary
anatomy or pathology. The coronary calcium score/coronary CTA
interpretation by the cardiologist is attached.
CLINICAL DATA: Endocarditis/Aortic root abscess/Prosthetic aortic
valve.
Cardiac TAVR CT
TECHNIQUE: The patient was scanned on a Phillips Force scanner. A 120 kV
retrospective scan was triggered in the descending thoracic aorta at
111 HU's. Gantry rotation speed was 250 msecs and collimation was .6
mm. No beta blockade or nitro were given. The 3D data set was
reconstructed in 5% intervals of the R-R cycle. Systolic and
diastolic phases were analyzed on a dedicated work station using
MPR, MIP and VRT modes. The patient received 80 cc of contrast.

[Series 8: 0-90% · axial · 0.39mm/px · z∈[+1163,+1297]mm · 5 of 3370 slices shown]
[im 562/3370  vessel]
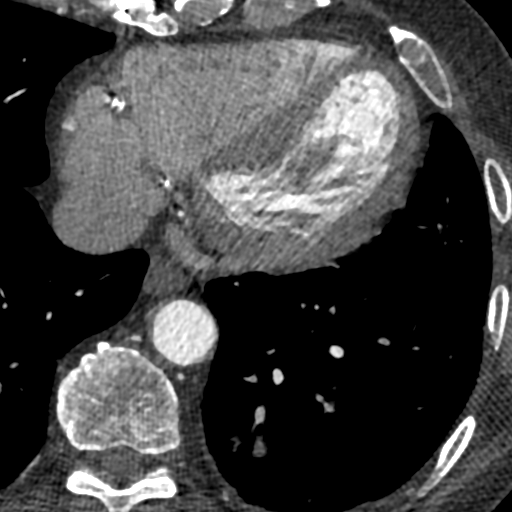
[im 1124/3370  vessel]
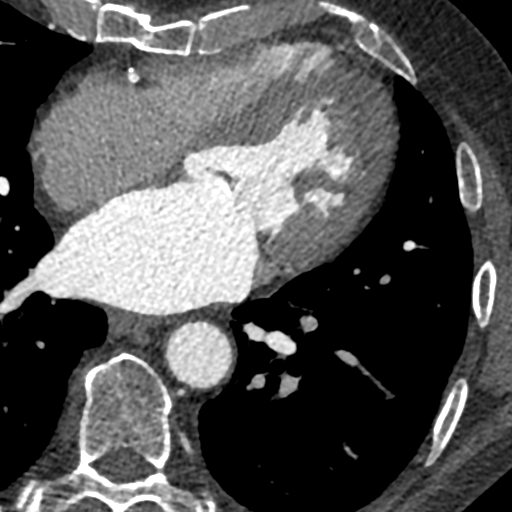
[im 1685/3370  vessel]
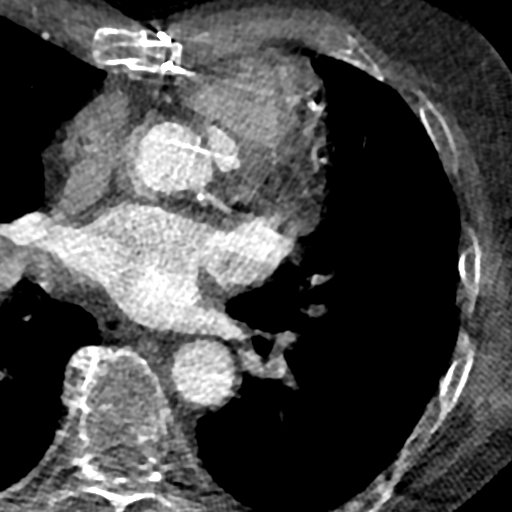
[im 2247/3370  vessel]
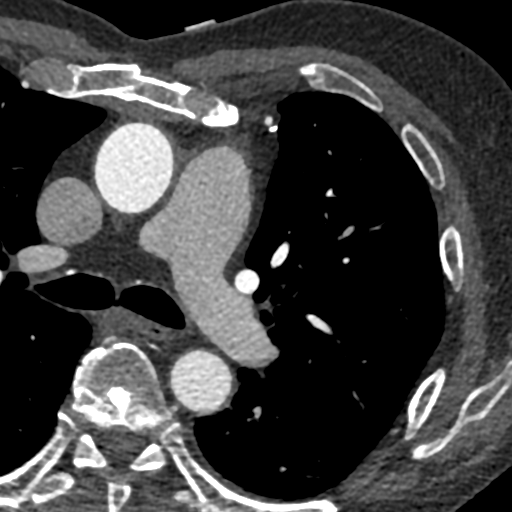
[im 2808/3370  vessel]
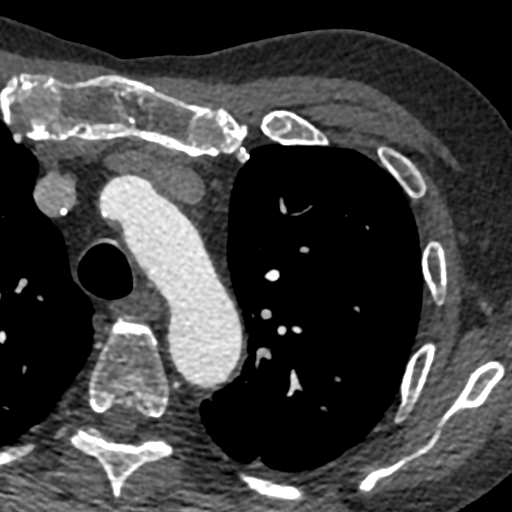

[Series 9: 5-95% · axial · 0.39mm/px · z∈[+1158,+1302]mm · 6 of 3370 slices shown, 8 images]
[im 482/3370  vessel]
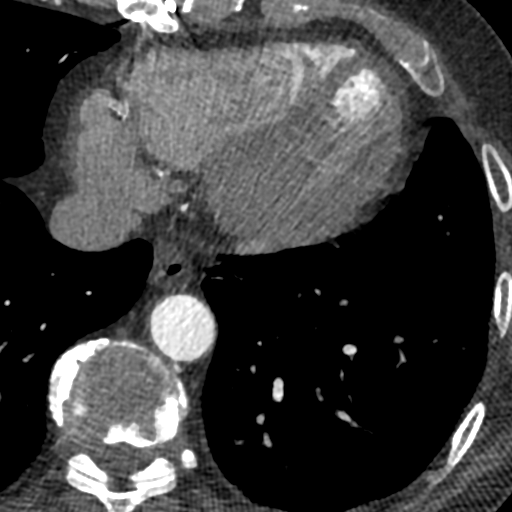
[im 482/3370  lung]
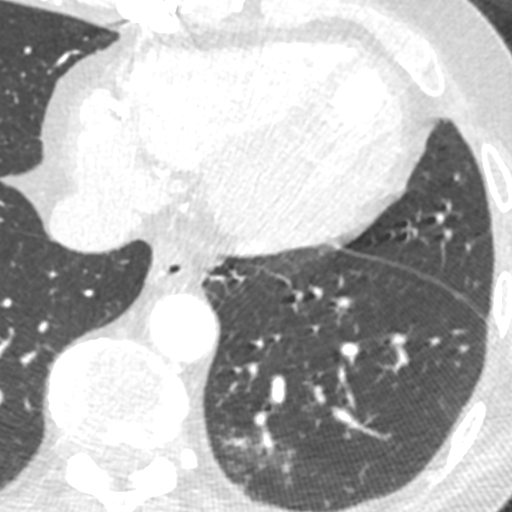
[im 963/3370  vessel]
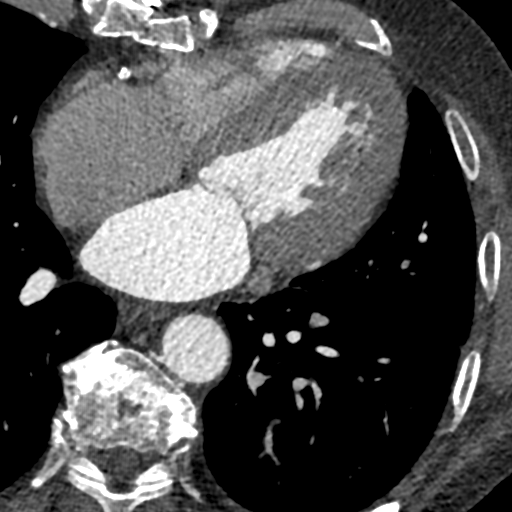
[im 1444/3370  vessel]
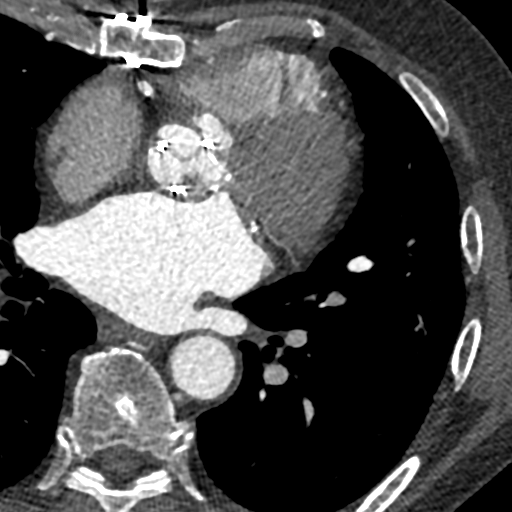
[im 1926/3370  vessel]
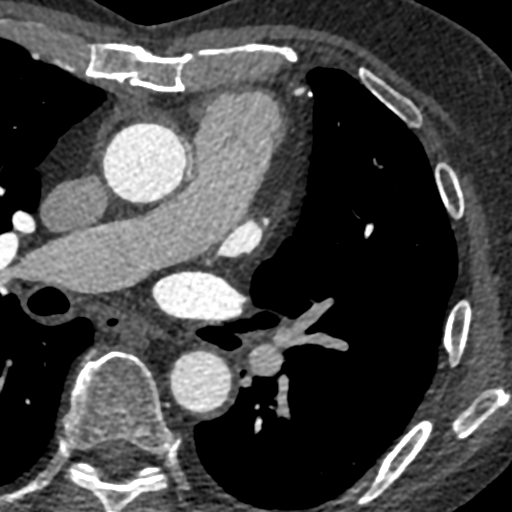
[im 2407/3370  vessel]
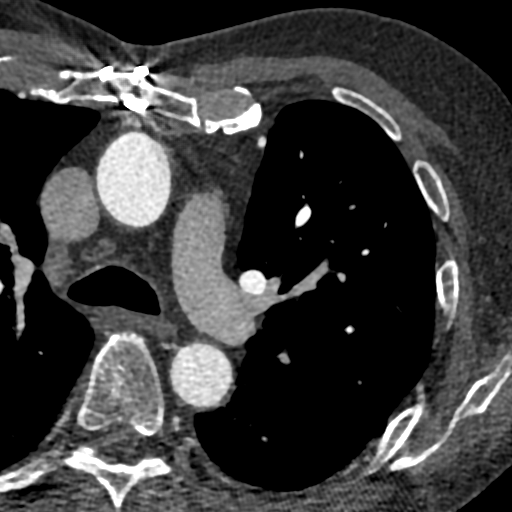
[im 2407/3370  lung]
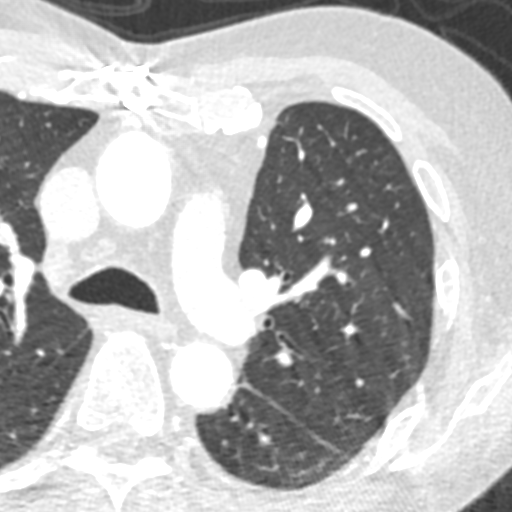
[im 2888/3370  vessel]
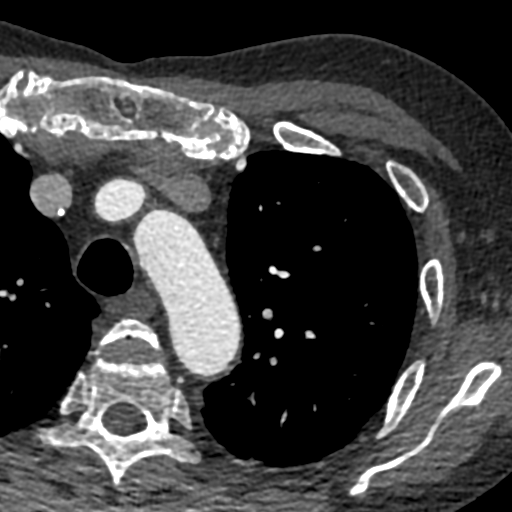

[11 of 20 positions shown; findings below may reference images not displayed]

FINDINGS: Within the visualized portions of the thorax there are no suspicious
appearing pulmonary nodules or masses, there is no acute
consolidative airspace disease, no pleural effusions, no
pneumothorax and no lymphadenopathy. Visualized portions of the
upper abdomen are unremarkable. There are no aggressive appearing
lytic or blastic lesions noted in the visualized portions of the
skeleton. Status post median sternotomy for aortic valve
replacement.
IMPRESSION: No significant incidental noncardiac findings are noted.
FINDINGS: Image quality: Average.

Noise artifact is: There is cardiac motion (PVC) and respiratory
artifact.

Aortic Valve: There is a 23 mm Magna ease bioprosthetic valve
present in the aortic position. The leaflet of the LCC appears
thickened with likely vegetation. There is an aortic root abscess
with a large pseudoaneurysm that measures 30 mm x 10 mm located off
the LCC of the aortic root with extension anteriorly in front of the
RCC. There is a fistulous connection between the pseudoaneurysm and
the LVOT anterior to the RCC of the aortic root.

Coronary Arteries: Normal coronary origin. Right dominance. CAC
score not performed due to prior CABG. Please refer to recent
cardiac cath for definitive assessment of coronary arteries and
bypass grafts.

Bypass Grafts: A patent [REDACTED] to mid LAD is seen. No other vein
grafts observed. At least one is occluded, likely 2, on the anterior
aspect of the aorta.

Left main: The left main is a large caliber vessel with a normal
take off from the left coronary cusp that bifurcates to form a left
anterior descending artery and a left circumflex artery. There is
minimal calcified plaque (<25%).

Left anterior descending artery: The LAD is 100% occluded
proximally.

Left circumflex artery: The LCX is non-dominant. The proximal LCX
contains mild calcified plaque (25-49%). Distal evaluation is
limited by artifact.

Right coronary artery: The RCA is dominant with normal take off from
the right coronary cusp. There is mild to moderate heavily calcified
plaque throughout. Vessel severity is unable to be determined. The
RCA terminates as PDA and YAO branches with heavily calcified
plaque.

Cardiac Morphology:

Right Atrium: Right atrial size is within normal limits.

Right Ventricle: The right ventricular cavity is within normal
limits.

Left Atrium: Left atrial size is normal in size with no left atrial
appendage filling defect.

Left Ventricle: The ventricular cavity size is within normal limits.

Pulmonary arteries: Normal in size without proximal filling defect.

Pulmonary veins: Normal pulmonary venous drainage.

Pericardium: Normal thickness with no significant effusion or
calcium present.

Mitral Valve: The mitral valve is normal structure without
significant calcification.

Extra-cardiac findings: See attached radiology report for
non-cardiac structures.
IMPRESSION: 1. Prosthetic valve (23 mm Magna ease) endocarditis with leaflet
thickening concerning for vegetation.

2. Aortic root abscess and large pseudoaneurysm off the LCC
extending anteriorly in front of the RCC.

3. Fistulous connection between the pseudoaneurysm and LVOT
described above.

3. 3-vessel CAD with patent [REDACTED]-LAD.

*** End of Addendum ***
EXAM:
OVER-READ INTERPRETATION  CT CHEST

The following report is an over-read performed by radiologist Dr.
Carlinton Torralba [REDACTED] on 04/14/2020. This
over-read does not include interpretation of cardiac or coronary
anatomy or pathology. The coronary calcium score/coronary CTA
interpretation by the cardiologist is attached.
FINDINGS: Within the visualized portions of the thorax there are no suspicious
appearing pulmonary nodules or masses, there is no acute
consolidative airspace disease, no pleural effusions, no
pneumothorax and no lymphadenopathy. Visualized portions of the
upper abdomen are unremarkable. There are no aggressive appearing
lytic or blastic lesions noted in the visualized portions of the
skeleton. Status post median sternotomy for aortic valve
replacement.
IMPRESSION: No significant incidental noncardiac findings are noted.

## 2021-06-25 LAB — ACID FAST SMEAR (AFB, MYCOBACTERIA): Acid Fast Smear: NEGATIVE

## 2021-08-04 DIAGNOSIS — H60392 Other infective otitis externa, left ear: Secondary | ICD-10-CM | POA: Diagnosis not present

## 2021-08-04 DIAGNOSIS — H9212 Otorrhea, left ear: Secondary | ICD-10-CM

## 2021-08-04 DIAGNOSIS — H9012 Conductive hearing loss, unilateral, left ear, with unrestricted hearing on the contralateral side: Secondary | ICD-10-CM | POA: Diagnosis not present

## 2021-08-04 DIAGNOSIS — H9202 Otalgia, left ear: Secondary | ICD-10-CM

## 2021-08-04 HISTORY — DX: Otalgia, left ear: H92.02

## 2021-08-04 HISTORY — DX: Otorrhea, left ear: H92.12

## 2021-08-05 DIAGNOSIS — Z6830 Body mass index (BMI) 30.0-30.9, adult: Secondary | ICD-10-CM

## 2021-08-05 DIAGNOSIS — M4620 Osteomyelitis of vertebra, site unspecified: Secondary | ICD-10-CM | POA: Diagnosis not present

## 2021-08-05 HISTORY — DX: Body mass index (BMI) 30.0-30.9, adult: Z68.30

## 2021-08-06 DIAGNOSIS — H9212 Otorrhea, left ear: Secondary | ICD-10-CM | POA: Diagnosis not present

## 2021-08-06 DIAGNOSIS — H9012 Conductive hearing loss, unilateral, left ear, with unrestricted hearing on the contralateral side: Secondary | ICD-10-CM | POA: Diagnosis not present

## 2021-08-06 DIAGNOSIS — H60392 Other infective otitis externa, left ear: Secondary | ICD-10-CM | POA: Diagnosis not present

## 2021-08-16 DIAGNOSIS — H9212 Otorrhea, left ear: Secondary | ICD-10-CM | POA: Diagnosis not present

## 2021-08-16 DIAGNOSIS — H93292 Other abnormal auditory perceptions, left ear: Secondary | ICD-10-CM | POA: Diagnosis not present

## 2021-08-16 DIAGNOSIS — H90A31 Mixed conductive and sensorineural hearing loss, unilateral, right ear with restricted hearing on the contralateral side: Secondary | ICD-10-CM | POA: Diagnosis not present

## 2021-08-16 DIAGNOSIS — H7192 Unspecified cholesteatoma, left ear: Secondary | ICD-10-CM

## 2021-08-16 HISTORY — DX: Unspecified cholesteatoma, left ear: H71.92

## 2021-08-20 DIAGNOSIS — H9202 Otalgia, left ear: Secondary | ICD-10-CM | POA: Diagnosis not present

## 2021-08-20 DIAGNOSIS — H9212 Otorrhea, left ear: Secondary | ICD-10-CM | POA: Diagnosis not present

## 2021-08-20 DIAGNOSIS — H7192 Unspecified cholesteatoma, left ear: Secondary | ICD-10-CM | POA: Diagnosis not present

## 2021-08-20 DIAGNOSIS — H66002 Acute suppurative otitis media without spontaneous rupture of ear drum, left ear: Secondary | ICD-10-CM | POA: Diagnosis not present

## 2021-08-23 DIAGNOSIS — H6122 Impacted cerumen, left ear: Secondary | ICD-10-CM | POA: Diagnosis not present

## 2021-08-23 DIAGNOSIS — B369 Superficial mycosis, unspecified: Secondary | ICD-10-CM | POA: Diagnosis not present

## 2021-08-23 DIAGNOSIS — H6242 Otitis externa in other diseases classified elsewhere, left ear: Secondary | ICD-10-CM | POA: Diagnosis not present

## 2021-08-26 ENCOUNTER — Other Ambulatory Visit: Payer: Self-pay | Admitting: Cardiology

## 2021-08-26 NOTE — Telephone Encounter (Signed)
Zetia 10 mg # 90 only with message patient needs appointment for future refills / 1st attempt  sent to [id number] - [place], [place]

## 2021-09-04 DIAGNOSIS — Z23 Encounter for immunization: Secondary | ICD-10-CM | POA: Diagnosis not present

## 2021-09-04 DIAGNOSIS — E785 Hyperlipidemia, unspecified: Secondary | ICD-10-CM | POA: Diagnosis not present

## 2021-09-04 DIAGNOSIS — E1129 Type 2 diabetes mellitus with other diabetic kidney complication: Secondary | ICD-10-CM | POA: Diagnosis not present

## 2021-09-04 DIAGNOSIS — E039 Hypothyroidism, unspecified: Secondary | ICD-10-CM | POA: Diagnosis not present

## 2021-09-04 DIAGNOSIS — Z9181 History of falling: Secondary | ICD-10-CM | POA: Diagnosis not present

## 2021-09-04 DIAGNOSIS — I4891 Unspecified atrial fibrillation: Secondary | ICD-10-CM | POA: Diagnosis not present

## 2021-09-04 DIAGNOSIS — R809 Proteinuria, unspecified: Secondary | ICD-10-CM | POA: Diagnosis not present

## 2021-09-04 DIAGNOSIS — Z1331 Encounter for screening for depression: Secondary | ICD-10-CM | POA: Diagnosis not present

## 2021-09-04 DIAGNOSIS — D509 Iron deficiency anemia, unspecified: Secondary | ICD-10-CM | POA: Diagnosis not present

## 2021-09-06 DIAGNOSIS — H6122 Impacted cerumen, left ear: Secondary | ICD-10-CM | POA: Diagnosis not present

## 2021-09-06 DIAGNOSIS — H6242 Otitis externa in other diseases classified elsewhere, left ear: Secondary | ICD-10-CM | POA: Diagnosis not present

## 2021-09-06 DIAGNOSIS — H60392 Other infective otitis externa, left ear: Secondary | ICD-10-CM | POA: Diagnosis not present

## 2021-09-06 DIAGNOSIS — B369 Superficial mycosis, unspecified: Secondary | ICD-10-CM | POA: Diagnosis not present

## 2021-09-30 DIAGNOSIS — H6091 Unspecified otitis externa, right ear: Secondary | ICD-10-CM | POA: Diagnosis not present

## 2021-11-24 DIAGNOSIS — J209 Acute bronchitis, unspecified: Secondary | ICD-10-CM | POA: Diagnosis not present

## 2021-11-24 DIAGNOSIS — Z20828 Contact with and (suspected) exposure to other viral communicable diseases: Secondary | ICD-10-CM | POA: Diagnosis not present

## 2021-11-24 DIAGNOSIS — R051 Acute cough: Secondary | ICD-10-CM | POA: Diagnosis not present

## 2021-11-24 DIAGNOSIS — R062 Wheezing: Secondary | ICD-10-CM | POA: Diagnosis not present

## 2021-12-04 DIAGNOSIS — Z125 Encounter for screening for malignant neoplasm of prostate: Secondary | ICD-10-CM | POA: Diagnosis not present

## 2021-12-04 DIAGNOSIS — D509 Iron deficiency anemia, unspecified: Secondary | ICD-10-CM | POA: Diagnosis not present

## 2021-12-04 DIAGNOSIS — I4891 Unspecified atrial fibrillation: Secondary | ICD-10-CM | POA: Diagnosis not present

## 2021-12-04 DIAGNOSIS — E1129 Type 2 diabetes mellitus with other diabetic kidney complication: Secondary | ICD-10-CM | POA: Diagnosis not present

## 2021-12-04 DIAGNOSIS — E039 Hypothyroidism, unspecified: Secondary | ICD-10-CM | POA: Diagnosis not present

## 2021-12-04 DIAGNOSIS — R809 Proteinuria, unspecified: Secondary | ICD-10-CM | POA: Diagnosis not present

## 2021-12-04 DIAGNOSIS — E785 Hyperlipidemia, unspecified: Secondary | ICD-10-CM | POA: Diagnosis not present

## 2021-12-10 ENCOUNTER — Ambulatory Visit: Payer: BC Managed Care – PPO | Admitting: Cardiology

## 2022-03-05 DIAGNOSIS — D509 Iron deficiency anemia, unspecified: Secondary | ICD-10-CM | POA: Diagnosis not present

## 2022-03-05 DIAGNOSIS — E1129 Type 2 diabetes mellitus with other diabetic kidney complication: Secondary | ICD-10-CM | POA: Diagnosis not present

## 2022-03-05 DIAGNOSIS — E039 Hypothyroidism, unspecified: Secondary | ICD-10-CM | POA: Diagnosis not present

## 2022-03-05 DIAGNOSIS — I4891 Unspecified atrial fibrillation: Secondary | ICD-10-CM | POA: Diagnosis not present

## 2022-03-05 DIAGNOSIS — E785 Hyperlipidemia, unspecified: Secondary | ICD-10-CM | POA: Diagnosis not present

## 2022-03-05 DIAGNOSIS — R809 Proteinuria, unspecified: Secondary | ICD-10-CM | POA: Diagnosis not present

## 2022-06-11 DIAGNOSIS — E785 Hyperlipidemia, unspecified: Secondary | ICD-10-CM | POA: Diagnosis not present

## 2022-06-11 DIAGNOSIS — E039 Hypothyroidism, unspecified: Secondary | ICD-10-CM | POA: Diagnosis not present

## 2022-06-11 DIAGNOSIS — I4891 Unspecified atrial fibrillation: Secondary | ICD-10-CM | POA: Diagnosis not present

## 2022-06-11 DIAGNOSIS — D509 Iron deficiency anemia, unspecified: Secondary | ICD-10-CM | POA: Diagnosis not present

## 2022-06-11 DIAGNOSIS — R809 Proteinuria, unspecified: Secondary | ICD-10-CM | POA: Diagnosis not present

## 2022-06-11 DIAGNOSIS — E1129 Type 2 diabetes mellitus with other diabetic kidney complication: Secondary | ICD-10-CM | POA: Diagnosis not present

## 2022-08-04 DIAGNOSIS — H353211 Exudative age-related macular degeneration, right eye, with active choroidal neovascularization: Secondary | ICD-10-CM | POA: Diagnosis not present

## 2022-08-18 DIAGNOSIS — H353211 Exudative age-related macular degeneration, right eye, with active choroidal neovascularization: Secondary | ICD-10-CM | POA: Diagnosis not present

## 2022-09-10 DIAGNOSIS — D509 Iron deficiency anemia, unspecified: Secondary | ICD-10-CM | POA: Diagnosis not present

## 2022-09-10 DIAGNOSIS — I4891 Unspecified atrial fibrillation: Secondary | ICD-10-CM | POA: Diagnosis not present

## 2022-09-10 DIAGNOSIS — E1129 Type 2 diabetes mellitus with other diabetic kidney complication: Secondary | ICD-10-CM | POA: Diagnosis not present

## 2022-09-10 DIAGNOSIS — E039 Hypothyroidism, unspecified: Secondary | ICD-10-CM | POA: Diagnosis not present

## 2022-09-10 DIAGNOSIS — E785 Hyperlipidemia, unspecified: Secondary | ICD-10-CM | POA: Diagnosis not present

## 2022-09-10 DIAGNOSIS — R809 Proteinuria, unspecified: Secondary | ICD-10-CM | POA: Diagnosis not present

## 2022-09-10 DIAGNOSIS — Z23 Encounter for immunization: Secondary | ICD-10-CM | POA: Diagnosis not present

## 2022-09-15 DIAGNOSIS — H353211 Exudative age-related macular degeneration, right eye, with active choroidal neovascularization: Secondary | ICD-10-CM | POA: Diagnosis not present

## 2022-10-20 DIAGNOSIS — H353211 Exudative age-related macular degeneration, right eye, with active choroidal neovascularization: Secondary | ICD-10-CM | POA: Diagnosis not present

## 2022-11-17 DIAGNOSIS — H353211 Exudative age-related macular degeneration, right eye, with active choroidal neovascularization: Secondary | ICD-10-CM | POA: Diagnosis not present

## 2022-12-15 DIAGNOSIS — D509 Iron deficiency anemia, unspecified: Secondary | ICD-10-CM | POA: Diagnosis not present

## 2022-12-15 DIAGNOSIS — R809 Proteinuria, unspecified: Secondary | ICD-10-CM | POA: Diagnosis not present

## 2022-12-15 DIAGNOSIS — I4891 Unspecified atrial fibrillation: Secondary | ICD-10-CM | POA: Diagnosis not present

## 2022-12-15 DIAGNOSIS — E1129 Type 2 diabetes mellitus with other diabetic kidney complication: Secondary | ICD-10-CM | POA: Diagnosis not present

## 2022-12-15 DIAGNOSIS — E785 Hyperlipidemia, unspecified: Secondary | ICD-10-CM | POA: Diagnosis not present

## 2022-12-15 DIAGNOSIS — E039 Hypothyroidism, unspecified: Secondary | ICD-10-CM | POA: Diagnosis not present

## 2022-12-22 DIAGNOSIS — H353211 Exudative age-related macular degeneration, right eye, with active choroidal neovascularization: Secondary | ICD-10-CM | POA: Diagnosis not present

## 2023-01-19 DIAGNOSIS — H353211 Exudative age-related macular degeneration, right eye, with active choroidal neovascularization: Secondary | ICD-10-CM | POA: Diagnosis not present

## 2023-02-16 DIAGNOSIS — H353211 Exudative age-related macular degeneration, right eye, with active choroidal neovascularization: Secondary | ICD-10-CM | POA: Diagnosis not present

## 2023-03-17 DIAGNOSIS — E1129 Type 2 diabetes mellitus with other diabetic kidney complication: Secondary | ICD-10-CM | POA: Diagnosis not present

## 2023-03-17 DIAGNOSIS — E039 Hypothyroidism, unspecified: Secondary | ICD-10-CM | POA: Diagnosis not present

## 2023-03-17 DIAGNOSIS — R809 Proteinuria, unspecified: Secondary | ICD-10-CM | POA: Diagnosis not present

## 2023-03-17 DIAGNOSIS — I4891 Unspecified atrial fibrillation: Secondary | ICD-10-CM | POA: Diagnosis not present

## 2023-03-17 DIAGNOSIS — D509 Iron deficiency anemia, unspecified: Secondary | ICD-10-CM | POA: Diagnosis not present

## 2023-03-17 DIAGNOSIS — E785 Hyperlipidemia, unspecified: Secondary | ICD-10-CM | POA: Diagnosis not present

## 2023-03-18 LAB — LAB REPORT - SCANNED
A1c: 6
Albumin, Urine POC: 113.9
Albumin/Creatinine Ratio, Urine, POC: 74
Creatinine, POC: 153.5 mg/dL
EGFR: 40

## 2023-04-13 DIAGNOSIS — H353211 Exudative age-related macular degeneration, right eye, with active choroidal neovascularization: Secondary | ICD-10-CM | POA: Diagnosis not present

## 2023-04-24 DIAGNOSIS — R58 Hemorrhage, not elsewhere classified: Secondary | ICD-10-CM | POA: Diagnosis not present

## 2023-04-24 DIAGNOSIS — R040 Epistaxis: Secondary | ICD-10-CM | POA: Diagnosis not present

## 2023-04-24 DIAGNOSIS — I10 Essential (primary) hypertension: Secondary | ICD-10-CM | POA: Diagnosis not present

## 2023-04-24 DIAGNOSIS — Z7901 Long term (current) use of anticoagulants: Secondary | ICD-10-CM | POA: Diagnosis not present

## 2023-04-27 DIAGNOSIS — R040 Epistaxis: Secondary | ICD-10-CM | POA: Diagnosis not present

## 2023-05-18 DIAGNOSIS — R040 Epistaxis: Secondary | ICD-10-CM | POA: Diagnosis not present

## 2023-05-30 DIAGNOSIS — E785 Hyperlipidemia, unspecified: Secondary | ICD-10-CM | POA: Diagnosis not present

## 2023-05-30 DIAGNOSIS — G729 Myopathy, unspecified: Secondary | ICD-10-CM | POA: Diagnosis not present

## 2023-05-30 DIAGNOSIS — D509 Iron deficiency anemia, unspecified: Secondary | ICD-10-CM | POA: Diagnosis not present

## 2023-05-30 DIAGNOSIS — R809 Proteinuria, unspecified: Secondary | ICD-10-CM | POA: Diagnosis not present

## 2023-05-30 DIAGNOSIS — E1129 Type 2 diabetes mellitus with other diabetic kidney complication: Secondary | ICD-10-CM | POA: Diagnosis not present

## 2023-05-30 DIAGNOSIS — E039 Hypothyroidism, unspecified: Secondary | ICD-10-CM | POA: Diagnosis not present

## 2023-05-30 DIAGNOSIS — I4891 Unspecified atrial fibrillation: Secondary | ICD-10-CM | POA: Diagnosis not present

## 2023-06-15 DIAGNOSIS — H353211 Exudative age-related macular degeneration, right eye, with active choroidal neovascularization: Secondary | ICD-10-CM | POA: Diagnosis not present

## 2023-08-10 DIAGNOSIS — H353211 Exudative age-related macular degeneration, right eye, with active choroidal neovascularization: Secondary | ICD-10-CM | POA: Diagnosis not present

## 2023-09-11 DIAGNOSIS — E039 Hypothyroidism, unspecified: Secondary | ICD-10-CM | POA: Diagnosis not present

## 2023-09-11 DIAGNOSIS — R809 Proteinuria, unspecified: Secondary | ICD-10-CM | POA: Diagnosis not present

## 2023-09-11 DIAGNOSIS — Z23 Encounter for immunization: Secondary | ICD-10-CM | POA: Diagnosis not present

## 2023-09-11 DIAGNOSIS — E1129 Type 2 diabetes mellitus with other diabetic kidney complication: Secondary | ICD-10-CM | POA: Diagnosis not present

## 2023-09-11 DIAGNOSIS — D509 Iron deficiency anemia, unspecified: Secondary | ICD-10-CM | POA: Diagnosis not present

## 2023-09-11 DIAGNOSIS — I4891 Unspecified atrial fibrillation: Secondary | ICD-10-CM | POA: Diagnosis not present

## 2023-09-11 DIAGNOSIS — E785 Hyperlipidemia, unspecified: Secondary | ICD-10-CM | POA: Diagnosis not present

## 2023-10-05 DIAGNOSIS — H353211 Exudative age-related macular degeneration, right eye, with active choroidal neovascularization: Secondary | ICD-10-CM | POA: Diagnosis not present

## 2023-11-30 DIAGNOSIS — H353211 Exudative age-related macular degeneration, right eye, with active choroidal neovascularization: Secondary | ICD-10-CM | POA: Diagnosis not present

## 2023-12-18 DIAGNOSIS — E039 Hypothyroidism, unspecified: Secondary | ICD-10-CM | POA: Diagnosis not present

## 2023-12-18 DIAGNOSIS — D509 Iron deficiency anemia, unspecified: Secondary | ICD-10-CM | POA: Diagnosis not present

## 2023-12-18 DIAGNOSIS — R809 Proteinuria, unspecified: Secondary | ICD-10-CM | POA: Diagnosis not present

## 2023-12-18 DIAGNOSIS — E1129 Type 2 diabetes mellitus with other diabetic kidney complication: Secondary | ICD-10-CM | POA: Diagnosis not present

## 2023-12-18 DIAGNOSIS — E785 Hyperlipidemia, unspecified: Secondary | ICD-10-CM | POA: Diagnosis not present

## 2023-12-18 DIAGNOSIS — Z125 Encounter for screening for malignant neoplasm of prostate: Secondary | ICD-10-CM | POA: Diagnosis not present

## 2023-12-18 DIAGNOSIS — I4891 Unspecified atrial fibrillation: Secondary | ICD-10-CM | POA: Diagnosis not present

## 2024-01-25 DIAGNOSIS — H353211 Exudative age-related macular degeneration, right eye, with active choroidal neovascularization: Secondary | ICD-10-CM | POA: Diagnosis not present

## 2024-03-18 DIAGNOSIS — I4891 Unspecified atrial fibrillation: Secondary | ICD-10-CM | POA: Diagnosis not present

## 2024-03-18 DIAGNOSIS — E039 Hypothyroidism, unspecified: Secondary | ICD-10-CM | POA: Diagnosis not present

## 2024-03-18 DIAGNOSIS — R809 Proteinuria, unspecified: Secondary | ICD-10-CM | POA: Diagnosis not present

## 2024-03-18 DIAGNOSIS — D509 Iron deficiency anemia, unspecified: Secondary | ICD-10-CM | POA: Diagnosis not present

## 2024-03-18 DIAGNOSIS — E785 Hyperlipidemia, unspecified: Secondary | ICD-10-CM | POA: Diagnosis not present

## 2024-03-18 DIAGNOSIS — E1129 Type 2 diabetes mellitus with other diabetic kidney complication: Secondary | ICD-10-CM | POA: Diagnosis not present

## 2024-03-18 DIAGNOSIS — J301 Allergic rhinitis due to pollen: Secondary | ICD-10-CM | POA: Diagnosis not present

## 2024-05-02 DIAGNOSIS — H353211 Exudative age-related macular degeneration, right eye, with active choroidal neovascularization: Secondary | ICD-10-CM | POA: Diagnosis not present

## 2024-06-28 DIAGNOSIS — R809 Proteinuria, unspecified: Secondary | ICD-10-CM | POA: Diagnosis not present

## 2024-06-28 DIAGNOSIS — D509 Iron deficiency anemia, unspecified: Secondary | ICD-10-CM | POA: Diagnosis not present

## 2024-06-28 DIAGNOSIS — E039 Hypothyroidism, unspecified: Secondary | ICD-10-CM | POA: Diagnosis not present

## 2024-06-28 DIAGNOSIS — E785 Hyperlipidemia, unspecified: Secondary | ICD-10-CM | POA: Diagnosis not present

## 2024-06-28 DIAGNOSIS — E1129 Type 2 diabetes mellitus with other diabetic kidney complication: Secondary | ICD-10-CM | POA: Diagnosis not present

## 2024-06-28 DIAGNOSIS — I4891 Unspecified atrial fibrillation: Secondary | ICD-10-CM | POA: Diagnosis not present

## 2024-07-25 DIAGNOSIS — H353211 Exudative age-related macular degeneration, right eye, with active choroidal neovascularization: Secondary | ICD-10-CM | POA: Diagnosis not present

## 2024-10-03 DIAGNOSIS — E785 Hyperlipidemia, unspecified: Secondary | ICD-10-CM | POA: Diagnosis not present

## 2024-10-03 DIAGNOSIS — D509 Iron deficiency anemia, unspecified: Secondary | ICD-10-CM | POA: Diagnosis not present

## 2024-10-03 DIAGNOSIS — Z23 Encounter for immunization: Secondary | ICD-10-CM | POA: Diagnosis not present

## 2024-10-03 DIAGNOSIS — R809 Proteinuria, unspecified: Secondary | ICD-10-CM | POA: Diagnosis not present

## 2024-10-03 DIAGNOSIS — I4891 Unspecified atrial fibrillation: Secondary | ICD-10-CM | POA: Diagnosis not present

## 2024-10-03 DIAGNOSIS — E039 Hypothyroidism, unspecified: Secondary | ICD-10-CM | POA: Diagnosis not present

## 2024-10-03 DIAGNOSIS — E1129 Type 2 diabetes mellitus with other diabetic kidney complication: Secondary | ICD-10-CM | POA: Diagnosis not present
